# Patient Record
Sex: Female | Born: 1965 | Race: Black or African American | Hispanic: No | Marital: Married | Smoking: Current every day smoker
Health system: Southern US, Community
[De-identification: ages and names within clinical notes are randomized; demographics above are authoritative.]

## PROBLEM LIST (undated history)

## (undated) DIAGNOSIS — I499 Cardiac arrhythmia, unspecified: Secondary | ICD-10-CM

## (undated) DIAGNOSIS — K279 Peptic ulcer, site unspecified, unspecified as acute or chronic, without hemorrhage or perforation: Secondary | ICD-10-CM

## (undated) DIAGNOSIS — I099 Rheumatic heart disease, unspecified: Secondary | ICD-10-CM

## (undated) DIAGNOSIS — G4730 Sleep apnea, unspecified: Secondary | ICD-10-CM

## (undated) DIAGNOSIS — T7840XA Allergy, unspecified, initial encounter: Secondary | ICD-10-CM

## (undated) DIAGNOSIS — M10272 Drug-induced gout, left ankle and foot: Secondary | ICD-10-CM

## (undated) DIAGNOSIS — J189 Pneumonia, unspecified organism: Secondary | ICD-10-CM

## (undated) DIAGNOSIS — E119 Type 2 diabetes mellitus without complications: Secondary | ICD-10-CM

## (undated) DIAGNOSIS — M1990 Unspecified osteoarthritis, unspecified site: Secondary | ICD-10-CM

## (undated) DIAGNOSIS — I272 Pulmonary hypertension, unspecified: Secondary | ICD-10-CM

## (undated) DIAGNOSIS — J449 Chronic obstructive pulmonary disease, unspecified: Secondary | ICD-10-CM

## (undated) DIAGNOSIS — I10 Essential (primary) hypertension: Secondary | ICD-10-CM

## (undated) DIAGNOSIS — I2510 Atherosclerotic heart disease of native coronary artery without angina pectoris: Secondary | ICD-10-CM

## (undated) DIAGNOSIS — J45909 Unspecified asthma, uncomplicated: Secondary | ICD-10-CM

## (undated) DIAGNOSIS — I509 Heart failure, unspecified: Secondary | ICD-10-CM

## (undated) DIAGNOSIS — M797 Fibromyalgia: Secondary | ICD-10-CM

## (undated) DIAGNOSIS — F419 Anxiety disorder, unspecified: Secondary | ICD-10-CM

## (undated) DIAGNOSIS — K219 Gastro-esophageal reflux disease without esophagitis: Secondary | ICD-10-CM

## (undated) HISTORY — DX: Drug-induced gout, left ankle and foot: M10.272

## (undated) HISTORY — PX: CORONARY ARTERY BYPASS GRAFT: SHX141

## (undated) HISTORY — PX: MITRAL VALVE REPLACEMENT: SHX147

## (undated) HISTORY — DX: Chronic obstructive pulmonary disease, unspecified: J44.9

## (undated) HISTORY — DX: Essential (primary) hypertension: I10

## (undated) HISTORY — PX: TONSILLECTOMY: SUR1361

## (undated) HISTORY — DX: Allergy, unspecified, initial encounter: T78.40XA

## (undated) HISTORY — DX: Gastro-esophageal reflux disease without esophagitis: K21.9

## (undated) HISTORY — DX: Unspecified osteoarthritis, unspecified site: M19.90

## (undated) HISTORY — PX: ADENOIDECTOMY: SUR15

## (undated) HISTORY — PX: MULTIPLE TOOTH EXTRACTIONS: SHX2053

## (undated) HISTORY — DX: Peptic ulcer, site unspecified, unspecified as acute or chronic, without hemorrhage or perforation: K27.9

## (undated) NOTE — *Deleted (*Deleted)
The patient presents today via audio telemedicine to review results of Holter monitor. At the last office visit, the patient reported a recent history of intermittent palpitations described as a fluttering sensation, which can last off and on for 45 minutes to 2 days at a time. She takes metoprolol tartrate 100 mg BID. 72-hour Holter monitor starting on 08/08/2020 revealed predominant sinus rhythm with a mean heart rate of 85 bpm with heart rate ranging from 66-144 bpm. There were occasional premature atrial contractions and infrequent premature ventricular contractions present. There were rare atrial runs, the longest lasting 9 minutes. There was no correlation with diary entries. The patient reports that palpitations are about the same, no worse. She denies chest pain. She has chronic exertional shortness of breath, and shortness of breath at rest with underlying severe asthma, chronic oxygen supplementation requirement at 4 L, and anemia. The patient reported a recent history of episodes of melena, for which she is now followed by GI, and will be undergoing an EGD and colonoscopy. Xarelto was discontinued by her primary care provider. The patient has chronic lower extremity edema, which has worsened recently. She reports that her weight fluctuates, but is currently up 3 pounds overnight. She takes Lasix 80 mg in the morning, 40 mg at night, and metolazone 2.5 mg every other day. Labs on 08/08/2020 show sodium 140, potassium 3.5, BUN 14, creatinine 0.6. She drinks about 64 ounces of fluids per day. She sleeps at an incline and uses a CPAP. She continues to smoke about 3 cigarettes per day. She was scheduled to be seen in [facility], but canceled due to concerns of Covid.  2D echocardiogram 01/29/2020 revealed LV ejection fraction greater than 55%, stable appearing mitral valve bioprosthesis, mild mitral regurgitation, moderate tricuspid regurgitation consistent with moderate pulmonary hypertension.    The patient has paroxysmal atrial fibrillation, previously on Xarelto, which was reportedly discontinued by her PCP about 3 months ago when the patient reported tarry stools. Recent CBC is not available to review. She did have a GI bleed in 04/2018 due to small gastric ulcer.  The patient has essential hypertension currently on metoprolol tartrate, furosemide 80 mg in the morning and 40 mg in the evening, and metolazone 2.5 mg every other day, which are well tolerated without apparent side effects.The patienttries tofollow a low-sodium, no added salt diet.   Patient states she is having dizziness and vertigo daily. States symptoms improve when she takes Meclizine. States she had a bad episode two days ago. States she took two Valium during severe episode with relief. States she almost went to the hospital for the severe episode. Can have nausea with dizziness. States she is tired after episodes of dizziness.    DATA SUMMARY: 07/23/2019 MRI brain IMPRESSION: 1. No acute intracranial abnormality. Stable noncontrast MRI appearance of the brain since 2015, negative for age aside from a small chronic left cerebellar PICA infarct. 2. 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 Note that occasionally such an injury can be associated with dysfunction of the Oculocardiac Reflex.  01/04/2019 EMG IMPRESSION: Normal study. There is no electrodiagnostic evidence of a large fiber neuropathy in the lower extremities.   01/05/2018 MRI L spine IMPRESSION: 1. Mild disc degeneration without progression since July 2018 comparison. No degenerative impingement. 2. Epidural fat expansion that has progressed from 2018, with moderate thecal sac effacement at L4-5.  Patient states headaches are better. States she has a headache twice weekly. States headaches are located  at the top of her head and the right side of her head. States pain can radiate to the back of her neck.  States she is no longer sleeping since stopping Ambien. States Valium 5 mg twice daily has helped with headaches. No side effects. Taking nortriptyline 50 mg nightly. No side effects.

## (undated) NOTE — *Deleted (*Deleted)
HEADACHE  - Stable.  - Patient with headaches. States headaches are less frequent and less severe. Reports two headaches a week. Reports difficulty sleeping since stopping Ambien.  - Start amitriptyline 50 mg nightly.  - Discontinue nortriptyline.  - Continue gabapentin 600 mg three times daily, refilled.  - Continue Valium 5 mg twice daily, refilled. - Continue Fioricet as needed at headache onset, refilled.   DIPLOPIA - Ongoing.  - Patient with diplopia and blurry vision. States this occurs every day.  - Will continue to monitor.   DIZZINESS - Ongoing.  - Patient with spells of dizziness and vertigo. States she had a severe spell of dizziness two days ago. States she has nausea and numbness in her face and hands when she has a spell.  - Continue Meclizine 25 mg three times daily as needed, refilled.  - Continue phenergan 12.5 mg as needed for nausea, refilled.  - Continue Valium as above.  - Send referral to physical therapy for vestibular rehab.   NUMBNESS - Ongoing.  - Patient with numbness in her head, face, and hands. States numbness occurs when she has spells of dizziness.  - Will continue to monitor.

---

## 1998-05-22 ENCOUNTER — Emergency Department (HOSPITAL_COMMUNITY): Admission: EM | Admit: 1998-05-22 | Discharge: 1998-05-22 | Payer: Self-pay | Admitting: Emergency Medicine

## 1998-06-14 ENCOUNTER — Emergency Department (HOSPITAL_COMMUNITY): Admission: EM | Admit: 1998-06-14 | Discharge: 1998-06-14 | Payer: Self-pay | Admitting: Emergency Medicine

## 1999-01-15 ENCOUNTER — Encounter: Payer: Self-pay | Admitting: Emergency Medicine

## 1999-01-15 ENCOUNTER — Emergency Department (HOSPITAL_COMMUNITY): Admission: EM | Admit: 1999-01-15 | Discharge: 1999-01-15 | Payer: Self-pay | Admitting: Emergency Medicine

## 1999-02-13 ENCOUNTER — Emergency Department (HOSPITAL_COMMUNITY): Admission: EM | Admit: 1999-02-13 | Discharge: 1999-02-13 | Payer: Self-pay | Admitting: Emergency Medicine

## 1999-04-23 ENCOUNTER — Encounter: Admission: RE | Admit: 1999-04-23 | Discharge: 1999-04-23 | Payer: Self-pay | Admitting: Family Medicine

## 1999-05-26 ENCOUNTER — Encounter: Admission: RE | Admit: 1999-05-26 | Discharge: 1999-05-26 | Payer: Self-pay | Admitting: Family Medicine

## 1999-07-06 ENCOUNTER — Emergency Department (HOSPITAL_COMMUNITY): Admission: EM | Admit: 1999-07-06 | Discharge: 1999-07-06 | Payer: Self-pay | Admitting: Emergency Medicine

## 1999-10-26 ENCOUNTER — Encounter: Payer: Self-pay | Admitting: Emergency Medicine

## 1999-10-26 ENCOUNTER — Emergency Department (HOSPITAL_COMMUNITY): Admission: EM | Admit: 1999-10-26 | Discharge: 1999-10-26 | Payer: Self-pay | Admitting: Emergency Medicine

## 1999-12-03 ENCOUNTER — Emergency Department (HOSPITAL_COMMUNITY): Admission: EM | Admit: 1999-12-03 | Discharge: 1999-12-03 | Payer: Self-pay | Admitting: Emergency Medicine

## 2000-03-20 ENCOUNTER — Emergency Department (HOSPITAL_COMMUNITY): Admission: EM | Admit: 2000-03-20 | Discharge: 2000-03-20 | Payer: Self-pay | Admitting: Emergency Medicine

## 2000-04-26 ENCOUNTER — Emergency Department (HOSPITAL_COMMUNITY): Admission: EM | Admit: 2000-04-26 | Discharge: 2000-04-26 | Payer: Self-pay | Admitting: *Deleted

## 2000-06-08 ENCOUNTER — Emergency Department (HOSPITAL_COMMUNITY): Admission: EM | Admit: 2000-06-08 | Discharge: 2000-06-08 | Payer: Self-pay | Admitting: *Deleted

## 2007-02-09 ENCOUNTER — Ambulatory Visit: Payer: Self-pay | Admitting: Obstetrics & Gynecology

## 2007-02-09 ENCOUNTER — Inpatient Hospital Stay (HOSPITAL_COMMUNITY): Admission: AD | Admit: 2007-02-09 | Discharge: 2007-02-11 | Payer: Self-pay | Admitting: Obstetrics & Gynecology

## 2012-06-01 ENCOUNTER — Inpatient Hospital Stay: Payer: Self-pay | Admitting: Internal Medicine

## 2012-06-01 LAB — URINALYSIS, COMPLETE
Bacteria: NONE SEEN
Bilirubin,UR: NEGATIVE
Blood: NEGATIVE
Glucose,UR: NEGATIVE mg/dL (ref 0–75)
Ketone: NEGATIVE
Leukocyte Esterase: NEGATIVE
Nitrite: NEGATIVE
Ph: 5 (ref 4.5–8.0)
Protein: NEGATIVE
RBC,UR: <1 /HPF (ref 0–5)
Specific Gravity: 1.008 (ref 1.003–1.030)
Squamous Epithelial: 1
WBC UR: 1 /HPF (ref 0–5)

## 2012-06-01 LAB — COMPREHENSIVE METABOLIC PANEL
Albumin: 3.5 g/dL (ref 3.4–5.0)
Alkaline Phosphatase: 100 U/L (ref 50–136)
Anion Gap: 8 (ref 7–16)
BUN: 9 mg/dL (ref 7–18)
Bilirubin,Total: 0.4 mg/dL (ref 0.2–1.0)
Calcium, Total: 8.6 mg/dL (ref 8.5–10.1)
Chloride: 108 mmol/L — ABNORMAL HIGH (ref 98–107)
Co2: 24 mmol/L (ref 21–32)
Creatinine: 0.78 mg/dL (ref 0.60–1.30)
EGFR (African American): >60
EGFR (Non-African Amer.): >60
Glucose: 89 mg/dL (ref 65–99)
Osmolality: 278 (ref 275–301)
Potassium: 3.7 mmol/L (ref 3.5–5.1)
SGOT(AST): 26 U/L (ref 15–37)
SGPT (ALT): 22 U/L
Sodium: 140 mmol/L (ref 136–145)
Total Protein: 7.6 g/dL (ref 6.4–8.2)

## 2012-06-01 LAB — DRUG SCREEN, URINE
Amphetamines, Ur Screen: NEGATIVE (ref ?–1000)
Barbiturates, Ur Screen: NEGATIVE (ref ?–200)
Benzodiazepine, Ur Scrn: NEGATIVE (ref ?–200)
Cannabinoid 50 Ng, Ur ~~LOC~~: POSITIVE (ref ?–50)
MDMA (Ecstasy)Ur Screen: NEGATIVE (ref ?–500)
Methadone, Ur Screen: NEGATIVE (ref ?–300)
Phencyclidine (PCP) Ur S: NEGATIVE (ref ?–25)

## 2012-06-01 LAB — CBC
HCT: 35.2 % (ref 35.0–47.0)
HGB: 11.5 g/dL — ABNORMAL LOW (ref 12.0–16.0)
MCH: 29.7 pg (ref 26.0–34.0)
MCHC: 32.6 g/dL (ref 32.0–36.0)
MCV: 91 fL (ref 80–100)
Platelet: 199 10*3/uL (ref 150–440)
RBC: 3.88 10*6/uL (ref 3.80–5.20)
RDW: 13.6 % (ref 11.5–14.5)
WBC: 8.3 10*3/uL (ref 3.6–11.0)

## 2012-06-01 LAB — CK TOTAL AND CKMB (NOT AT ARMC)
CK, Total: 124 U/L (ref 21–215)
CK-MB: 1.6 ng/mL (ref 0.5–3.6)

## 2012-06-01 LAB — PREGNANCY, URINE: Pregnancy Test, Urine: NEGATIVE m[IU]/mL

## 2012-06-01 LAB — PRO B NATRIURETIC PEPTIDE: B-Type Natriuretic Peptide: 1437 pg/mL — ABNORMAL HIGH (ref 0–125)

## 2012-06-01 LAB — TROPONIN I
Troponin-I: <0.02 ng/mL
Troponin-I: <0.02 ng/mL

## 2012-06-02 LAB — BASIC METABOLIC PANEL
Anion Gap: 12 (ref 7–16)
BUN: 12 mg/dL (ref 7–18)
Creatinine: 1.09 mg/dL (ref 0.60–1.30)
EGFR (African American): >60
EGFR (Non-African Amer.): >60
Glucose: 145 mg/dL — ABNORMAL HIGH (ref 65–99)
Osmolality: 280 (ref 275–301)
Potassium: 3.6 mmol/L (ref 3.5–5.1)

## 2012-06-02 LAB — CBC WITH DIFFERENTIAL/PLATELET
Basophil %: 0.1 %
Eosinophil #: 0 10*3/uL (ref 0.0–0.7)
Eosinophil %: 0 %
Lymphocyte %: 7.4 %
MCH: 29 pg (ref 26.0–34.0)
Monocyte %: 3.2 %
Neutrophil %: 89.3 %
Platelet: 226 10*3/uL (ref 150–440)
RBC: 4.06 10*6/uL (ref 3.80–5.20)
WBC: 9 10*3/uL (ref 3.6–11.0)

## 2012-06-02 LAB — LIPID PANEL
Cholesterol: 145 mg/dL (ref 0–200)
VLDL Cholesterol, Calc: 21 mg/dL (ref 5–40)

## 2012-06-02 LAB — PRO B NATRIURETIC PEPTIDE: B-Type Natriuretic Peptide: 2126 pg/mL — ABNORMAL HIGH (ref 0–125)

## 2012-06-02 LAB — CK TOTAL AND CKMB (NOT AT ARMC): CK, Total: 93 U/L (ref 21–215)

## 2012-06-03 LAB — BASIC METABOLIC PANEL
Anion Gap: 9 (ref 7–16)
Calcium, Total: 8.4 mg/dL — ABNORMAL LOW (ref 8.5–10.1)
Chloride: 104 mmol/L (ref 98–107)
Co2: 25 mmol/L (ref 21–32)
Creatinine: 0.65 mg/dL (ref 0.60–1.30)
EGFR (African American): >60
Osmolality: 277 (ref 275–301)

## 2012-06-07 ENCOUNTER — Emergency Department: Payer: Self-pay | Admitting: Emergency Medicine

## 2012-06-12 ENCOUNTER — Inpatient Hospital Stay: Payer: Self-pay | Admitting: Internal Medicine

## 2012-06-12 LAB — TROPONIN I
Troponin-I: <0.02 ng/mL
Troponin-I: <0.02 ng/mL
Troponin-I: <0.02 ng/mL

## 2012-06-12 LAB — COMPREHENSIVE METABOLIC PANEL WITH GFR
Albumin: 3.2 g/dL — ABNORMAL LOW
Alkaline Phosphatase: 87 U/L
Anion Gap: 13
BUN: 10 mg/dL
Bilirubin,Total: 0.3 mg/dL
Calcium, Total: 8.2 mg/dL — ABNORMAL LOW
Chloride: 107 mmol/L
Co2: 23 mmol/L
Creatinine: 0.67 mg/dL
EGFR (African American): >60
EGFR (Non-African Amer.): >60
Glucose: 113 mg/dL — ABNORMAL HIGH
Osmolality: 285
Potassium: 3.8 mmol/L
SGOT(AST): 25 U/L
SGPT (ALT): 23 U/L
Sodium: 143 mmol/L
Total Protein: 7.3 g/dL

## 2012-06-12 LAB — CBC
HCT: 33.9 % — ABNORMAL LOW
HGB: 11 g/dL — ABNORMAL LOW
MCH: 29.2 pg
MCHC: 32.6 g/dL
MCV: 90 fL
Platelet: 188 x10 3/mm 3
RBC: 3.78 X10 6/mm 3 — ABNORMAL LOW
RDW: 13.5 %
WBC: 8.8 x10 3/mm 3

## 2012-06-12 LAB — PRO B NATRIURETIC PEPTIDE: B-Type Natriuretic Peptide: 2102 pg/mL — ABNORMAL HIGH

## 2012-06-13 LAB — COMPREHENSIVE METABOLIC PANEL
Alkaline Phosphatase: 81 U/L (ref 50–136)
Bilirubin,Total: 0.5 mg/dL (ref 0.2–1.0)
Chloride: 105 mmol/L (ref 98–107)
Co2: 29 mmol/L (ref 21–32)
Creatinine: 0.71 mg/dL (ref 0.60–1.30)
EGFR (African American): >60
EGFR (Non-African Amer.): >60
Potassium: 3.4 mmol/L — ABNORMAL LOW (ref 3.5–5.1)
SGOT(AST): 19 U/L (ref 15–37)
SGPT (ALT): 22 U/L

## 2012-06-13 LAB — CBC WITH DIFFERENTIAL/PLATELET
Eosinophil %: 2.5 %
HCT: 35.3 % (ref 35.0–47.0)
Lymphocyte #: 1.7 10*3/uL (ref 1.0–3.6)
Lymphocyte %: 17.1 %
MCV: 90 fL (ref 80–100)
Monocyte %: 6.2 %
Neutrophil #: 7.1 10*3/uL — ABNORMAL HIGH (ref 1.4–6.5)
Platelet: 192 10*3/uL (ref 150–440)
RBC: 3.91 10*6/uL (ref 3.80–5.20)

## 2012-06-14 LAB — PROTIME-INR: INR: 0.9

## 2012-06-14 LAB — BASIC METABOLIC PANEL
Anion Gap: 11 (ref 7–16)
Chloride: 98 mmol/L (ref 98–107)
Co2: 28 mmol/L (ref 21–32)
Creatinine: 0.62 mg/dL (ref 0.60–1.30)
EGFR (African American): >60
Osmolality: 273 (ref 275–301)
Potassium: 3.5 mmol/L (ref 3.5–5.1)

## 2012-06-14 LAB — CBC
HGB: 12.5 g/dL (ref 12.0–16.0)
MCH: 28.4 pg (ref 26.0–34.0)
MCHC: 31.7 g/dL — ABNORMAL LOW (ref 32.0–36.0)
MCV: 90 fL (ref 80–100)
Platelet: 243 10*3/uL (ref 150–440)

## 2012-06-14 LAB — DRUG SCREEN, URINE
Amphetamines, Ur Screen: NEGATIVE (ref ?–1000)
Barbiturates, Ur Screen: NEGATIVE (ref ?–200)
Cocaine Metabolite,Ur ~~LOC~~: NEGATIVE (ref ?–300)
Methadone, Ur Screen: NEGATIVE (ref ?–300)
Opiate, Ur Screen: POSITIVE (ref ?–300)
Phencyclidine (PCP) Ur S: NEGATIVE (ref ?–25)

## 2012-08-21 ENCOUNTER — Emergency Department: Payer: Self-pay | Admitting: Emergency Medicine

## 2012-08-21 LAB — CBC WITH DIFFERENTIAL/PLATELET
Basophil #: 0.1 10*3/uL (ref 0.0–0.1)
Eosinophil %: 0.6 %
HCT: 37 % (ref 35.0–47.0)
HGB: 11.9 g/dL — ABNORMAL LOW (ref 12.0–16.0)
Lymphocyte #: 2.1 10*3/uL (ref 1.0–3.6)
MCH: 27.4 pg (ref 26.0–34.0)
MCHC: 32.2 g/dL (ref 32.0–36.0)
MCV: 85 fL (ref 80–100)
Monocyte #: 0.7 x10 3/mm (ref 0.2–0.9)
Monocyte %: 7.9 %
Neutrophil %: 67.8 %
RBC: 4.36 10*6/uL (ref 3.80–5.20)
RDW: 15.4 % — ABNORMAL HIGH (ref 11.5–14.5)

## 2012-08-21 LAB — COMPREHENSIVE METABOLIC PANEL
Albumin: 3.1 g/dL — ABNORMAL LOW (ref 3.4–5.0)
Alkaline Phosphatase: 101 U/L (ref 50–136)
Anion Gap: 7 (ref 7–16)
BUN: 8 mg/dL (ref 7–18)
Calcium, Total: 8.3 mg/dL — ABNORMAL LOW (ref 8.5–10.1)
Chloride: 108 mmol/L — ABNORMAL HIGH (ref 98–107)
Co2: 25 mmol/L (ref 21–32)
Creatinine: 0.85 mg/dL (ref 0.60–1.30)
EGFR (Non-African Amer.): >60
Osmolality: 278 (ref 275–301)
Potassium: 4.3 mmol/L (ref 3.5–5.1)
SGOT(AST): 20 U/L (ref 15–37)
SGPT (ALT): 18 U/L (ref 12–78)
Sodium: 140 mmol/L (ref 136–145)

## 2012-08-21 LAB — URINALYSIS, COMPLETE
Bacteria: NONE SEEN
Bilirubin,UR: NEGATIVE
Glucose,UR: NEGATIVE mg/dL (ref 0–75)
Hyaline Cast: 1
Ketone: NEGATIVE
Nitrite: NEGATIVE
Protein: NEGATIVE
RBC,UR: 1 /HPF (ref 0–5)
Specific Gravity: 1.016 (ref 1.003–1.030)
Squamous Epithelial: 1
WBC UR: 1 /HPF (ref 0–5)

## 2012-08-21 LAB — PRO B NATRIURETIC PEPTIDE: B-Type Natriuretic Peptide: 907 pg/mL — ABNORMAL HIGH (ref 0–125)

## 2012-08-21 LAB — CK TOTAL AND CKMB (NOT AT ARMC)
CK, Total: 54 U/L (ref 21–215)
CK-MB: <0.5 ng/mL — ABNORMAL LOW (ref 0.5–3.6)

## 2012-08-21 LAB — TROPONIN I: Troponin-I: <0.02 ng/mL

## 2012-09-12 ENCOUNTER — Observation Stay: Payer: Self-pay | Admitting: Specialist

## 2012-09-12 LAB — BASIC METABOLIC PANEL
Anion Gap: 8 (ref 7–16)
BUN: 8 mg/dL (ref 7–18)
Chloride: 106 mmol/L (ref 98–107)
Creatinine: 0.62 mg/dL (ref 0.60–1.30)
EGFR (African American): >60
Potassium: 4 mmol/L (ref 3.5–5.1)

## 2012-09-12 LAB — CK TOTAL AND CKMB (NOT AT ARMC): CK, Total: 68 U/L (ref 21–215)

## 2012-09-12 LAB — TROPONIN I: Troponin-I: <0.02 ng/mL

## 2012-09-12 LAB — CBC
HCT: 40 % (ref 35.0–47.0)
MCH: 26.5 pg (ref 26.0–34.0)
MCHC: 32.1 g/dL (ref 32.0–36.0)
Platelet: 210 10*3/uL (ref 150–440)
RBC: 4.86 10*6/uL (ref 3.80–5.20)
WBC: 7.3 10*3/uL (ref 3.6–11.0)

## 2012-09-12 LAB — MAGNESIUM: Magnesium: 1.8 mg/dL

## 2012-09-13 LAB — BASIC METABOLIC PANEL
Calcium, Total: 8.4 mg/dL — ABNORMAL LOW (ref 8.5–10.1)
Chloride: 102 mmol/L (ref 98–107)
Co2: 31 mmol/L (ref 21–32)
Creatinine: 0.8 mg/dL (ref 0.60–1.30)
EGFR (African American): >60
EGFR (Non-African Amer.): >60
Osmolality: 277 (ref 275–301)

## 2012-09-13 LAB — CBC WITH DIFFERENTIAL/PLATELET
Basophil #: 0.1 10*3/uL (ref 0.0–0.1)
Eosinophil #: 0.1 10*3/uL (ref 0.0–0.7)
Eosinophil %: 0.7 %
HGB: 12.8 g/dL (ref 12.0–16.0)
Lymphocyte #: 1.5 10*3/uL (ref 1.0–3.6)
MCH: 26.8 pg (ref 26.0–34.0)
MCV: 83 fL (ref 80–100)
Monocyte #: 0.6 x10 3/mm (ref 0.2–0.9)
Neutrophil %: 72.5 %
Platelet: 192 10*3/uL (ref 150–440)
RBC: 4.76 10*6/uL (ref 3.80–5.20)
RDW: 15.9 % — ABNORMAL HIGH (ref 11.5–14.5)
WBC: 7.9 10*3/uL (ref 3.6–11.0)

## 2012-09-13 LAB — MAGNESIUM: Magnesium: 1.5 mg/dL — ABNORMAL LOW

## 2013-03-28 ENCOUNTER — Emergency Department: Payer: Self-pay | Admitting: Emergency Medicine

## 2013-03-28 LAB — COMPREHENSIVE METABOLIC PANEL
Alkaline Phosphatase: 248 U/L — ABNORMAL HIGH (ref 50–136)
Anion Gap: 9 (ref 7–16)
BUN: 6 mg/dL — ABNORMAL LOW (ref 7–18)
Bilirubin,Total: 2.2 mg/dL — ABNORMAL HIGH (ref 0.2–1.0)
Calcium, Total: 8.6 mg/dL (ref 8.5–10.1)
Chloride: 103 mmol/L (ref 98–107)
EGFR (African American): >60
Osmolality: 267 (ref 275–301)
Potassium: 5.4 mmol/L — ABNORMAL HIGH (ref 3.5–5.1)
SGOT(AST): 77 U/L — ABNORMAL HIGH (ref 15–37)
Sodium: 135 mmol/L — ABNORMAL LOW (ref 136–145)
Total Protein: 8.2 g/dL (ref 6.4–8.2)

## 2013-03-28 LAB — CK TOTAL AND CKMB (NOT AT ARMC): CK, Total: 189 U/L (ref 21–215)

## 2013-03-28 LAB — DRUG SCREEN, URINE
Barbiturates, Ur Screen: NEGATIVE (ref ?–200)
Benzodiazepine, Ur Scrn: POSITIVE (ref ?–200)
Cannabinoid 50 Ng, Ur ~~LOC~~: POSITIVE (ref ?–50)
Cocaine Metabolite,Ur ~~LOC~~: NEGATIVE (ref ?–300)
Opiate, Ur Screen: NEGATIVE (ref ?–300)

## 2013-03-28 LAB — CBC
HCT: 41.8 % (ref 35.0–47.0)
HGB: 13.8 g/dL (ref 12.0–16.0)
MCHC: 32.9 g/dL (ref 32.0–36.0)
MCV: 85 fL (ref 80–100)
Platelet: 240 10*3/uL (ref 150–440)

## 2013-04-25 ENCOUNTER — Emergency Department: Payer: Self-pay | Admitting: Emergency Medicine

## 2013-04-25 LAB — BASIC METABOLIC PANEL
Anion Gap: 6 — ABNORMAL LOW (ref 7–16)
BUN: 9 mg/dL (ref 7–18)
Calcium, Total: 8.9 mg/dL (ref 8.5–10.1)
Chloride: 106 mmol/L (ref 98–107)
Co2: 27 mmol/L (ref 21–32)
Creatinine: 0.7 mg/dL (ref 0.60–1.30)
EGFR (African American): >60
EGFR (Non-African Amer.): >60
Glucose: 83 mg/dL (ref 65–99)
Osmolality: 275 (ref 275–301)
Potassium: 3.9 mmol/L (ref 3.5–5.1)
Sodium: 139 mmol/L (ref 136–145)

## 2013-04-25 LAB — CBC
HCT: 41.3 % (ref 35.0–47.0)
HGB: 13.4 g/dL (ref 12.0–16.0)
MCH: 27.5 pg (ref 26.0–34.0)
MCHC: 32.4 g/dL (ref 32.0–36.0)
MCV: 85 fL (ref 80–100)
Platelet: 188 10*3/uL (ref 150–440)
RBC: 4.86 10*6/uL (ref 3.80–5.20)
RDW: 16.7 % — ABNORMAL HIGH (ref 11.5–14.5)
WBC: 8.1 10*3/uL (ref 3.6–11.0)

## 2013-04-25 LAB — TROPONIN I: Troponin-I: <0.02 ng/mL

## 2013-04-25 LAB — PRO B NATRIURETIC PEPTIDE: B-Type Natriuretic Peptide: 731 pg/mL — ABNORMAL HIGH (ref 0–125)

## 2013-12-08 ENCOUNTER — Inpatient Hospital Stay: Payer: Self-pay | Admitting: Internal Medicine

## 2013-12-08 LAB — BASIC METABOLIC PANEL
ANION GAP: 9 (ref 7–16)
BUN: 7 mg/dL (ref 7–18)
CALCIUM: 8.4 mg/dL — AB (ref 8.5–10.1)
Chloride: 104 mmol/L (ref 98–107)
Co2: 23 mmol/L (ref 21–32)
Creatinine: 0.55 mg/dL — ABNORMAL LOW (ref 0.60–1.30)
EGFR (African American): >60
Glucose: 75 mg/dL (ref 65–99)
Osmolality: 269 (ref 275–301)
POTASSIUM: 3.8 mmol/L (ref 3.5–5.1)
Sodium: 136 mmol/L (ref 136–145)

## 2013-12-08 LAB — HEPATIC FUNCTION PANEL A (ARMC)
Albumin: 3.3 g/dL — ABNORMAL LOW (ref 3.4–5.0)
Alkaline Phosphatase: 275 U/L — ABNORMAL HIGH
Bilirubin, Direct: 1.2 mg/dL — ABNORMAL HIGH (ref 0.00–0.20)
Bilirubin,Total: 2.3 mg/dL — ABNORMAL HIGH (ref 0.2–1.0)
SGOT(AST): 50 U/L — ABNORMAL HIGH (ref 15–37)
SGPT (ALT): 24 U/L (ref 12–78)
Total Protein: 7.9 g/dL (ref 6.4–8.2)

## 2013-12-08 LAB — CBC
HCT: 41.5 % (ref 35.0–47.0)
HGB: 13.6 g/dL (ref 12.0–16.0)
MCH: 28.4 pg (ref 26.0–34.0)
MCHC: 32.7 g/dL (ref 32.0–36.0)
MCV: 87 fL (ref 80–100)
PLATELETS: 165 10*3/uL (ref 150–440)
RBC: 4.78 10*6/uL (ref 3.80–5.20)
RDW: 17.3 % — AB (ref 11.5–14.5)
WBC: 7.1 10*3/uL (ref 3.6–11.0)

## 2013-12-08 LAB — HCG, QUANTITATIVE, PREGNANCY: Beta Hcg, Quant.: 1 m[IU]/mL

## 2013-12-08 LAB — TROPONIN I: Troponin-I: <0.02 ng/mL

## 2013-12-08 LAB — PRO B NATRIURETIC PEPTIDE: B-Type Natriuretic Peptide: 432 pg/mL — ABNORMAL HIGH (ref 0–125)

## 2013-12-09 LAB — BASIC METABOLIC PANEL
ANION GAP: 6 — AB (ref 7–16)
BUN: 10 mg/dL (ref 7–18)
CALCIUM: 8.7 mg/dL (ref 8.5–10.1)
CHLORIDE: 104 mmol/L (ref 98–107)
CO2: 26 mmol/L (ref 21–32)
CREATININE: 0.81 mg/dL (ref 0.60–1.30)
EGFR (African American): >60
Glucose: 134 mg/dL — ABNORMAL HIGH (ref 65–99)
Osmolality: 273 (ref 275–301)
Potassium: 4.5 mmol/L (ref 3.5–5.1)
SODIUM: 136 mmol/L (ref 136–145)

## 2013-12-09 LAB — TSH: Thyroid Stimulating Horm: 0.561 u[IU]/mL

## 2013-12-09 LAB — CBC WITH DIFFERENTIAL/PLATELET
BASOS PCT: 0.2 %
Basophil #: 0 10*3/uL (ref 0.0–0.1)
EOS ABS: 0 10*3/uL (ref 0.0–0.7)
EOS PCT: 0 %
HCT: 40.7 % (ref 35.0–47.0)
HGB: 13.5 g/dL (ref 12.0–16.0)
Lymphocyte #: 0.6 10*3/uL — ABNORMAL LOW (ref 1.0–3.6)
Lymphocyte %: 14.7 %
MCH: 29.3 pg (ref 26.0–34.0)
MCHC: 33.2 g/dL (ref 32.0–36.0)
MCV: 88 fL (ref 80–100)
Monocyte #: 0.2 x10 3/mm (ref 0.2–0.9)
Monocyte %: 4.3 %
NEUTROS ABS: 3.6 10*3/uL (ref 1.4–6.5)
Neutrophil %: 80.8 %
Platelet: 150 10*3/uL (ref 150–440)
RBC: 4.6 10*6/uL (ref 3.80–5.20)
RDW: 16.9 % — ABNORMAL HIGH (ref 11.5–14.5)
WBC: 4.4 10*3/uL (ref 3.6–11.0)

## 2013-12-09 LAB — LIPID PANEL
CHOLESTEROL: 91 mg/dL (ref 0–200)
HDL: 31 mg/dL — AB (ref 40–60)
Ldl Cholesterol, Calc: 50 mg/dL (ref 0–100)
TRIGLYCERIDES: 50 mg/dL (ref 0–200)
VLDL CHOLESTEROL, CALC: 10 mg/dL (ref 5–40)

## 2013-12-10 LAB — BASIC METABOLIC PANEL
ANION GAP: 4 — AB (ref 7–16)
BUN: 19 mg/dL — ABNORMAL HIGH (ref 7–18)
CO2: 27 mmol/L (ref 21–32)
Calcium, Total: 8.7 mg/dL (ref 8.5–10.1)
Chloride: 102 mmol/L (ref 98–107)
Creatinine: 0.83 mg/dL (ref 0.60–1.30)
EGFR (African American): >60
GLUCOSE: 112 mg/dL — AB (ref 65–99)
Osmolality: 269 (ref 275–301)
Potassium: 4.2 mmol/L (ref 3.5–5.1)
Sodium: 133 mmol/L — ABNORMAL LOW (ref 136–145)

## 2013-12-11 LAB — BASIC METABOLIC PANEL
Anion Gap: 6 — ABNORMAL LOW (ref 7–16)
BUN: 22 mg/dL — ABNORMAL HIGH (ref 7–18)
CALCIUM: 8.9 mg/dL (ref 8.5–10.1)
CO2: 27 mmol/L (ref 21–32)
Chloride: 101 mmol/L (ref 98–107)
Creatinine: 0.79 mg/dL (ref 0.60–1.30)
GLUCOSE: 106 mg/dL — AB (ref 65–99)
Osmolality: 272 (ref 275–301)
Potassium: 4.6 mmol/L (ref 3.5–5.1)
SODIUM: 134 mmol/L — AB (ref 136–145)

## 2013-12-13 LAB — BASIC METABOLIC PANEL
Anion Gap: 3 — ABNORMAL LOW (ref 7–16)
BUN: 17 mg/dL (ref 7–18)
CALCIUM: 8.4 mg/dL — AB (ref 8.5–10.1)
CHLORIDE: 99 mmol/L (ref 98–107)
CO2: 30 mmol/L (ref 21–32)
Creatinine: 0.7 mg/dL (ref 0.60–1.30)
EGFR (African American): >60
Glucose: 105 mg/dL — ABNORMAL HIGH (ref 65–99)
Osmolality: 266 (ref 275–301)
Potassium: 4.3 mmol/L (ref 3.5–5.1)
Sodium: 132 mmol/L — ABNORMAL LOW (ref 136–145)

## 2013-12-13 LAB — PLATELET COUNT: Platelet: 213 10*3/uL (ref 150–440)

## 2013-12-14 LAB — BASIC METABOLIC PANEL
ANION GAP: 2 — AB (ref 7–16)
BUN: 18 mg/dL (ref 7–18)
CO2: 32 mmol/L (ref 21–32)
CREATININE: 0.77 mg/dL (ref 0.60–1.30)
Calcium, Total: 8.2 mg/dL — ABNORMAL LOW (ref 8.5–10.1)
Chloride: 100 mmol/L (ref 98–107)
EGFR (African American): >60
EGFR (Non-African Amer.): >60
Glucose: 99 mg/dL (ref 65–99)
Osmolality: 270 (ref 275–301)
Potassium: 4.1 mmol/L (ref 3.5–5.1)
SODIUM: 134 mmol/L — AB (ref 136–145)

## 2013-12-15 LAB — BASIC METABOLIC PANEL WITH GFR
Anion Gap: 3 — ABNORMAL LOW
BUN: 17 mg/dL
Calcium, Total: 8.3 mg/dL — ABNORMAL LOW
Chloride: 99 mmol/L
Co2: 37 mmol/L — ABNORMAL HIGH
Creatinine: 1.04 mg/dL
EGFR (African American): >60
EGFR (Non-African Amer.): >60
Glucose: 97 mg/dL
Osmolality: 279
Potassium: 4.3 mmol/L
Sodium: 139 mmol/L

## 2013-12-15 LAB — CK: CK, Total: 26 U/L

## 2013-12-16 LAB — BASIC METABOLIC PANEL
ANION GAP: 5 — AB (ref 7–16)
BUN: 18 mg/dL (ref 7–18)
CALCIUM: 8.1 mg/dL — AB (ref 8.5–10.1)
CREATININE: 0.87 mg/dL (ref 0.60–1.30)
Chloride: 98 mmol/L (ref 98–107)
Co2: 34 mmol/L — ABNORMAL HIGH (ref 21–32)
EGFR (African American): >60
EGFR (Non-African Amer.): >60
GLUCOSE: 114 mg/dL — AB (ref 65–99)
Osmolality: 277 (ref 275–301)
POTASSIUM: 4.4 mmol/L (ref 3.5–5.1)
SODIUM: 137 mmol/L (ref 136–145)

## 2013-12-16 LAB — SEDIMENTATION RATE: Erythrocyte Sed Rate: 1 mm/hr (ref 0–20)

## 2013-12-17 ENCOUNTER — Ambulatory Visit: Payer: Self-pay | Admitting: Neurology

## 2013-12-17 LAB — PLATELET COUNT: PLATELETS: 241 10*3/uL (ref 150–440)

## 2013-12-17 LAB — CK: CK, TOTAL: 39 U/L (ref 21–215)

## 2013-12-18 LAB — PROT IMMUNOELECTROPHORES(ARMC)

## 2013-12-20 LAB — PATHOLOGY REPORT

## 2014-01-15 ENCOUNTER — Emergency Department: Payer: Self-pay | Admitting: Emergency Medicine

## 2014-01-15 LAB — CBC
HCT: 42.5 % (ref 35.0–47.0)
HGB: 13.9 g/dL (ref 12.0–16.0)
MCH: 27.8 pg (ref 26.0–34.0)
MCHC: 32.6 g/dL (ref 32.0–36.0)
MCV: 85 fL (ref 80–100)
Platelet: 159 10*3/uL (ref 150–440)
RBC: 4.98 10*6/uL (ref 3.80–5.20)
RDW: 16.2 % — AB (ref 11.5–14.5)
WBC: 5.2 10*3/uL (ref 3.6–11.0)

## 2014-01-15 LAB — BASIC METABOLIC PANEL
Anion Gap: 6 — ABNORMAL LOW (ref 7–16)
BUN: 10 mg/dL (ref 7–18)
CHLORIDE: 103 mmol/L (ref 98–107)
Calcium, Total: 8.7 mg/dL (ref 8.5–10.1)
Co2: 29 mmol/L (ref 21–32)
Creatinine: 0.79 mg/dL (ref 0.60–1.30)
EGFR (African American): >60
EGFR (Non-African Amer.): >60
Glucose: 78 mg/dL (ref 65–99)
OSMOLALITY: 274 (ref 275–301)
Potassium: 3.6 mmol/L (ref 3.5–5.1)
Sodium: 138 mmol/L (ref 136–145)

## 2014-01-15 LAB — PRO B NATRIURETIC PEPTIDE: B-Type Natriuretic Peptide: 726 pg/mL — ABNORMAL HIGH (ref 0–125)

## 2014-01-15 LAB — TROPONIN I

## 2014-02-07 ENCOUNTER — Ambulatory Visit: Payer: Self-pay

## 2014-04-03 LAB — TSH: TSH: 2.54 u[IU]/mL (ref 0.41–5.90)

## 2014-04-03 LAB — LIPID PANEL
CHOLESTEROL: 96 mg/dL (ref 0–200)
HDL: 32 mg/dL — AB (ref 35–70)
LDL Cholesterol: 51 mg/dL
TRIGLYCERIDES: 63 mg/dL (ref 40–160)

## 2014-04-18 ENCOUNTER — Ambulatory Visit: Payer: Self-pay

## 2014-08-21 LAB — BASIC METABOLIC PANEL
BUN: 9 mg/dL (ref 4–21)
CREATININE: 0.6 mg/dL (ref 0.5–1.1)
Glucose: 79 mg/dL
Potassium: 4.2 mmol/L (ref 3.4–5.3)
SODIUM: 138 mmol/L (ref 137–147)

## 2014-08-21 LAB — HEMOGLOBIN A1C: HEMOGLOBIN A1C: 5.2

## 2014-08-21 LAB — CBC AND DIFFERENTIAL
HCT: 40 % (ref 36–46)
HEMOGLOBIN: 13 g/dL (ref 12.0–16.0)
Neutrophils Absolute: 4 /uL
PLATELETS: 155 10*3/uL (ref 150–399)
WBC: 6 10^3/mL

## 2014-08-21 LAB — HEPATIC FUNCTION PANEL
ALK PHOS: 352 U/L — AB (ref 25–125)
ALT: 18 U/L (ref 7–35)
AST: 39 U/L — AB (ref 13–35)
BILIRUBIN, TOTAL: 2.4 mg/dL
Bilirubin, Direct: 1.5 mg/dL — AB (ref 0.01–0.4)

## 2014-11-02 ENCOUNTER — Ambulatory Visit: Payer: Self-pay | Admitting: Specialist

## 2014-11-02 ENCOUNTER — Ambulatory Visit: Payer: Self-pay | Admitting: Nurse Practitioner

## 2014-11-05 ENCOUNTER — Emergency Department: Payer: Self-pay | Admitting: Emergency Medicine

## 2014-11-05 LAB — BASIC METABOLIC PANEL
Anion Gap: 10 (ref 7–16)
BUN: 15 mg/dL (ref 7–18)
Calcium, Total: 8.5 mg/dL (ref 8.5–10.1)
Chloride: 102 mmol/L (ref 98–107)
Co2: 27 mmol/L (ref 21–32)
Creatinine: 0.68 mg/dL (ref 0.60–1.30)
EGFR (African American): >60
EGFR (Non-African Amer.): >60
Glucose: 95 mg/dL (ref 65–99)
OSMOLALITY: 278 (ref 275–301)
Potassium: 3.7 mmol/L (ref 3.5–5.1)
SODIUM: 139 mmol/L (ref 136–145)

## 2014-11-05 LAB — CBC
HCT: 43.2 % (ref 35.0–47.0)
HGB: 13.7 g/dL (ref 12.0–16.0)
MCH: 30.2 pg (ref 26.0–34.0)
MCHC: 31.8 g/dL — AB (ref 32.0–36.0)
MCV: 95 fL (ref 80–100)
Platelet: 181 10*3/uL (ref 150–440)
RBC: 4.55 10*6/uL (ref 3.80–5.20)
RDW: 16.9 % — AB (ref 11.5–14.5)
WBC: 7.7 10*3/uL (ref 3.6–11.0)

## 2014-11-05 LAB — TROPONIN I: Troponin-I: <0.02 ng/mL

## 2014-11-14 ENCOUNTER — Emergency Department: Payer: Self-pay | Admitting: Emergency Medicine

## 2015-01-28 ENCOUNTER — Emergency Department: Payer: Self-pay | Admitting: Emergency Medicine

## 2015-02-20 ENCOUNTER — Emergency Department: Payer: Self-pay | Admitting: Emergency Medicine

## 2015-02-25 DIAGNOSIS — J209 Acute bronchitis, unspecified: Secondary | ICD-10-CM | POA: Diagnosis present

## 2015-02-25 DIAGNOSIS — F32A Depression, unspecified: Secondary | ICD-10-CM | POA: Insufficient documentation

## 2015-02-25 DIAGNOSIS — F1911 Other psychoactive substance abuse, in remission: Secondary | ICD-10-CM | POA: Insufficient documentation

## 2015-02-25 DIAGNOSIS — I5032 Chronic diastolic (congestive) heart failure: Secondary | ICD-10-CM | POA: Insufficient documentation

## 2015-02-25 DIAGNOSIS — F329 Major depressive disorder, single episode, unspecified: Secondary | ICD-10-CM | POA: Insufficient documentation

## 2015-02-25 DIAGNOSIS — J440 Chronic obstructive pulmonary disease with acute lower respiratory infection: Secondary | ICD-10-CM

## 2015-03-13 DIAGNOSIS — I099 Rheumatic heart disease, unspecified: Secondary | ICD-10-CM | POA: Insufficient documentation

## 2015-03-13 DIAGNOSIS — F411 Generalized anxiety disorder: Secondary | ICD-10-CM | POA: Insufficient documentation

## 2015-03-13 DIAGNOSIS — I272 Pulmonary hypertension, unspecified: Secondary | ICD-10-CM | POA: Insufficient documentation

## 2015-03-15 ENCOUNTER — Other Ambulatory Visit: Payer: Self-pay | Admitting: Oncology

## 2015-03-15 DIAGNOSIS — Z1231 Encounter for screening mammogram for malignant neoplasm of breast: Secondary | ICD-10-CM

## 2015-03-19 NOTE — Discharge Summary (Signed)
PATIENT NAME:  Chelsea Ramsey MR#:  [id number] DATE OF BIRTH:  Mar 27, 1966  DATE OF ADMISSION:  09/12/2012 DATE OF DISCHARGE:  09/14/2012  For a detailed note, please take a look at the history and physical done on admission with Dr. Bridgette Habermann.   DIAGNOSES AT DISCHARGE:  1. Acute bronchitis.  2. Shortness of breath due to acute bronchitis.  3. History of congestive heart failure secondary to diastolic dysfunction. 4. Pulmonary hypertension.  5. Constipation.  6. Tobacco abuse.   DIET: Patient is being discharged on a low sodium diet.   ACTIVITY: As tolerated.   FOLLOW UP: Follow up is with the [facility] in the next 1 to 2 weeks.   DISCHARGE MEDICATIONS:   1. Coreg 3.125 mg b.i.d.  2. Lisinopril 2.5 mg daily.  3. Benadryl 25 mg every six hours as needed.  4. Ibuprofen 800 mg q.8 hours as needed. 5. Sublingual nitroglycerin as needed.  6. Tylenol with hydrocodone 5/325, 1 tablet q.6 hours as needed.  7. Albuterol inhaler 2 puffs every four hours as needed. 8. Tessalon Perles 100 mg q.i.d.  9. Zithromax 500 mg daily x5 days.   LABORATORY, DIAGNOSTIC AND RADIOLOGICAL DATA: Pertinent studies done during the hospital course are as follows: Chest x-ray done on admission showing findings could represent bronchitis.   BRIEF HOSPITAL COURSE: This is a 49 year old female who presented to the hospital on 09/12/2012 secondary to wheezing, shortness of breath and a nonproductive cough.  1. Acute bronchitis. This was likely the cause of patient's wheezing, shortness of breath and nonproductive cough. She was started on some Zithromax. A rapid flu test was obtained which was negative. She was also started on p.r.n. nebulizer treatments. Her clinical symptoms since admission have improved. She still has a nonproductive cough. She was strongly advised to quit smoking as this could make her bronchitis worse. For now she is being discharged on Zithromax along with antitussives as mentioned.   2. History of congestive heart failure. Patient's shortness of breath was not related to congestive heart failure but likely due to acute bronchitis. She did not show any symptoms of congestive heart failure. She will continue her Coreg and lisinopril as mentioned.  3. Pulmonary hypertension. This was based on cardiac catheterization findings in July 2013. Unlikely this was contributing to her shortness of breath. Although patient is somewhat on the obese side and would benefit from outpatient sleep study which was recommended and can be done as an outpatient after referral from the [facility].  4. Constipation. This resolved with a Dulcolax suppository.   CODE STATUS: Patient is a FULL CODE.   TIME SPENT: 40 minutes.  ____________________________ Belia Heman. Verdell Carmine, MD vjs:cms D: 09/14/2012 13:47:19 ET T: 09/15/2012 11:09:05 ET JOB#: [id number]  cc: Belia Heman. Verdell Carmine, MD, <Dictator> [facility]  Henreitta Leber MD ELECTRONICALLY SIGNED 09/16/2012 12:22

## 2015-03-19 NOTE — Discharge Summary (Signed)
PATIENT NAME:  Chelsea Ramsey MR#:  [id number] DATE OF BIRTH:  09/08/1966  DATE OF ADMISSION:  06/12/2012 DATE OF DISCHARGE:  06/15/2012  DISCHARGE DIAGNOSES:  1. Acute on chronic diastolic heart failure with echo on recent admission in July 2013 showing normal ejection fraction and moderate-to-severe mitral regurgitation along with tricuspid regurgitation.  2. Chest pain, likely musculoskeletal.  3. Mild-to-moderate pulmonary hypertension that will require outpatient pulmonary follow-up.   SECONDARY DIAGNOSES:  1. Diastolic congestive heart failure. 2. Hypertension of asthma.  3. History of tobacco and alcohol use.  4. History of cocaine use.  5. History of pulmonary hypertension.   CONSULTATION:  1. Cardiology, Dr. Ubaldo Glassing.  2. Pulmonary, Dr. Mortimer Fries.   LABORATORY, DIAGNOSTIC AND RADIOLOGICAL DATA:   Cardiac catheterization on July 16th showed mild-to-moderate pulmonary hypertension. Normal coronaries. Normal LV function. No global or regional wall motion abnormality.  Chest x-ray on July 14th showed mild congestive heart failure.  Abdominal ultrasound on July 15th was unremarkable.   HISTORY AND SHORT HOSPITAL COURSE: The patient is a 49 year old female with the above-mentioned medical problems who was admitted for acute on chronic diastolic heart failure. He was started on diuresis. Please see Dr. Graciela Husbands dictated History and Physical for further details. The patient was evaluated by Pulmonary, Dr. Mortimer Fries, considering ongoing breathing difficulty. She was slowly improving.  Dr. Mortimer Fries recommended cardiac catheterization (left and right-sided) to evaluate for possible pulmonary hypertension for which Cardiology consultation was obtained with Dr. Ubaldo Glassing, who performed cardiac catheterization with results dictated above. The patient was slowly improving. She did have some shortness of breath but was much improved. She was not hypoxic and had some pleuritic chest pain which was also improving and  was stable enough to be discharged home on July 17th.    PERTINENT DISCHARGE PHYSICAL EXAMINATION: VITAL SIGNS: On the date of discharge, her vital signs were as follows: Temperature 97.8, heart rate 82 per minute, respirations 18 per minute, blood pressure 115/81 mmHg. She was saturating 100% on room air at rest and 95% on exertion. CARDIOVASCULAR: On the date of discharge, S1, S2 normal. Systolic ejection murmur present. No  rubs or gallops. LUNGS: Clear to auscultation bilaterally. No wheezing, rales, rhonchi, or crepitation. She did have some reproducible chest wall tenderness along the midsternal area. ABDOMEN: Soft, benign. NEUROLOGIC: Nonfocal examination. All other physical examination remained at baseline.    DISCHARGE MEDICATIONS:  1. Coreg 3.125 mg p.o. b.i.d.  2. Lisinopril 2.5 mg p.o. daily.  3. Tylenol 650 mg p.o. daily as needed.  4. Cyclobenzaprine 10 mg p.o. t.i.d.   5. Ibuprofen 800 mg p.o. t.i.d.   6. Nitroglycerin 0.4 mg sublingual every 5 to 10 minutes as needed.  7. Chlorphentermine/hydrocodone 5 mL p.o. b.i.d. as needed for 5 days.   DISCHARGE DIET: Low sodium.   DISCHARGE ACTIVITY: As tolerated.  DISCHARGE INSTRUCTIONS AND FOLLOW-UP:  1. The patient was instructed to follow-up with her primary care physician at [facility] in 1 to 2 weeks.  2. She will need follow-up with new pulmonary physician, Dr. Raul Del, in 2 to 4 weeks.  3. She was instructed to stop Imdur as she did not want to use that. She was feeling that she was getting a lot more side effects of Imdur.  4. She was also instructed to stop tramadol, which she was using at home.         TIME SPENT:   Total time discharging this patient was 55 minutes.  ____________________________ Lucina Mellow. Manuella Ghazi, MD  vss:cbb D: 06/15/2012 16:37:03 ET T: 06/16/2012 11:56:00 ET JOB#: [id number]  cc: Karinda Cabriales S. Manuella Ghazi, MD, <Dictator> [facility] Mariane Duval, MD Javier Docker. Ubaldo Glassing, MD Herbon E. Raul Del, [place] MD ELECTRONICALLY SIGNED 06/16/2012 22:48

## 2015-03-19 NOTE — H&P (Signed)
PATIENT NAME:  Chelsea Ramsey MR#:  [id number] DATE OF BIRTH:  1966/04/28  DATE OF ADMISSION:  09/12/2012  PRIMARY CARE PHYSICIAN: [facility]  REFERRING PHYSICIAN: Dr. Arman Filter  CHIEF COMPLAINT: Shortness of breath.   HISTORY OF PRESENT ILLNESS: The patient is a 49 year old African American female with history of diastolic congestive heart failure, asthma, and tobacco abuse who has had a couple of recent hospitalizations this year for shortness of breath. She has undergone an echocardiogram, Myoview, and left and right heart catheterization. The heart catheterization did occur in August of this year and showed no coronary artery disease, preserved ejection fraction, and mild to moderate pulmonary hypertension. The patient comes in today for two days of shortness of breath and dyspnea on exertion. She also had some fevers two days ago as high as 102, which have resolved. Her symptoms are also accompanied by a cough with some whitish sputum production and muscle aches. She denies having any sick contacts or taking recent antibiotics. She endorses some weight gain as well. She also complains of some nausea and vomiting since last night, about five episodes thus far. Here x-ray of the chest shows possible bronchitis without evidence of pneumonia. She has no fever or white count. Hospitalist services were contacted for further evaluation and management after the patient was given Lasix as well as a nitroglycerin patch.   PAST MEDICAL HISTORY:  1. Diastolic congestive heart failure.  2. Hypertension.  3. History of asthma.  4. History of tobacco abuse.  5. Former crack cocaine abuser.  6. History of aspirin allergy. 7. Pulmonary hypertension with severe mitral regurgitation on 2-D echocardiogram from 06/01/2012.   PAST SURGICAL HISTORY:  1. Right knee surgery. 2. Tonsillectomy.   ALLERGIES: Aspirin causing swelling.   HOME MEDICATIONS:  1. Acetaminophen/hydrocodone 325/5 mg, 1 tab  every six hours as needed for pain.  2. Albuterol CFC 90 mcg inhaled aerosol 2 puffs 4 times a day as needed for shortness of breath, of which she has run out. 3. Benadryl 25 mg every six hours as needed for itching. 4. Coreg 3.125 mg 2 times a day.  5. Ibuprofen  p.r.n.  6. Lisinopril 2.5 mg 1 tab once a day. 7. Nitroglycerin sublingual p.r.n.   FAMILY HISTORY: Bone cancer in mother, hypertension in mother. Aunt with diabetes.   SOCIAL HISTORY: Still has been smoking tobacco two cigarettes per week, trying to cut down. Occasional beer. No illicit drug use. However, urine toxicology has shown marijuana recently and history of distant crack cocaine in the past.     REVIEW OF SYSTEMS: CONSTITUTIONAL: Positive for fever and weight gain. EYES: No blurry vision or double vision. Some dizziness. ENT: No tinnitus or hearing loss. RESPIRATORY: Positive for cough, wheezing. No hemoptysis. Positive dyspnea on exertion. No painful respiration. CARDIOVASCULAR: Some chest pain with coughing. There is swelling in the legs. Dyspnea on exertion.  No palpitations or syncope. GI: Positive for nausea and vomiting. No diarrhea, abdominal pain, hematemesis, or melena. GU: Denies dysuria or hematuria. ENDOCRINE: No polyuria or nocturia. HEME/LYMPH: No anemia or easy bruising. SKIN: No new rashes. MUSCULOSKELETAL: Some muscle pains. NEUROLOGIC: No numbness or weakness. PSYCH: No anxiety or depression.   PHYSICAL EXAMINATION:  VITAL SIGNS: Temperature 98.5, pulse rate 95, respiratory rate 22, blood pressure 162/70,  oxygen saturation 99% on room air.   GENERAL: The patient is an obese African American female lying in bed in no obvious distress.   HEENT: Normocephalic, atraumatic. Pupils equal and reactive. Anicteric  sclerae. Moist mucous membranes.   NECK: Supple. No JVD. No thyroid tenderness.   CARDIOVASCULAR: S1, S2, regular rate and rhythm. Positive for murmur.   LUNGS: Clear to auscultation without wheezing,  rhonchi, or crackles.   ABDOMEN: Soft, nontender, nondistended. Positive bowel sounds.   EXTREMITIES: No significant lower extremity edema.   SKIN: No obvious rashes.   NEURO: Cranial nerves II through XII grossly intact. Strength is five out of five in all extremities.   PSYCH: Awake, alert, oriented times three. Pleasant, cooperative.   LABORATORY, DIAGNOSTIC, AND RADIOLOGICAL DATA: BNP 1485, glucose 99, creatinine 0.62, sodium 139, potassium 4. Troponin negative times one.  WBC 7.3, hemoglobin  12.9, platelets are 210. Urinalysis not suggestive of infection. EKG showing sinus tachycardia, rate 104, possible left atrial enlargement. No acute ST elevations or depression.   X-ray of the chest showing findings that could represent bronchitis. Edema is felt to be less likely.   ASSESSMENT AND PLAN: We have a 48 year old African female with a history of diastolic congestive heart failure with a history of cardiac catheterization three months ago showing some pulmonary hypertension, no coronary artery disease with preserved ejection fraction, tobacco abuse history, and asthma history who presents with shortness of breath, dyspnea on exertion with body aches, a fever two days ago, cough with whitish sputum, without evidence of pneumonia per x-ray of the chest. The patient does have elevated BNP as well. Her symptoms could be likely from a viral upper respiratory infection/bronchitis type of a picture. There is no pneumonia, congestive heart failure, or effusions per x-ray of the chest. She has no fever or leukocytosis. She also has had no sick contacts. I would start the patient on azithromycin nebulizers p.r.n. and check influenza. Also check sputum cultures. I doubt the patient is having acute congestive heart failure. She has no lower extremity edema or crackles in the chest or evidence of volume overload currently and she has been given Lasix as well. I would continue her beta blocker and lisinopril.  The  patient might need to be discharged on low dose Lasix. The patient does smoke tobacco as well and she was counseled for three minutes. I will apply a patch. I will check a u tox as well. We will start her on heparin for deep vein thrombosis prophylaxis. I will start her on Zofran p.r.n. for symptomatic relief of her nausea and vomiting, which could also be from the viral etiology.   CODE STATUS: Patient is a FULL CODE.   TOTAL TIME SPENT: 55 minutes.     ____________________________ Vivien Presto, MD sa:bjt D: 09/12/2012 14:33:42 ET T: 09/12/2012 15:03:27 ET JOB#: [id number]  cc: Vivien Presto, MD, <Dictator> [facility] MD ELECTRONICALLY SIGNED 09/13/2012 17:58

## 2015-03-19 NOTE — Consult Note (Signed)
General Aspect patient is a 49 year old female with history of tobacco abuse, COPD and former crack cocaine abuse who was admitted with progressive chest pain and shortness of breath.  Patient describes the chest pain is midsternal.  It is worse with deep breathing.  It is worse with deep palpation of her chest.  She also states she gets worse when she ambulates and breathes deeply.  She denies any prior cardiac history.  She is ruled out for myocardial infarction.  EKG is unremarkable for active ischemia. she has undergone a noninvasive cardiac evaluation with a functional study which was unremarkable for ischemia during previous admission in June. Echocardiogram during that admission revealed pulmonary hypertension. She states she is compliant with her medications and denies substance abuse. She is ruled out for myocardial infarction.   Physical Exam:   GEN well nourished, obese    HEENT PERRL    NECK supple    RESP normal resp effort  clear BS  rhonchi    CARD Regular rate and rhythm  Murmur    Murmur Systolic    Systolic Murmur axilla    ABD denies tenderness  normal BS    LYMPH negative neck    EXTR negative cyanosis/clubbing, negative edema    SKIN normal to palpation    NEURO cranial nerves intact, motor/sensory function intact    PSYCH A+O to time, place, person, poor insight   Review of Systems:   Subjective/Chief Complaint chest pain and shortness of breath    General: Fatigue    Skin: No Complaints    ENT: No Complaints    Eyes: No Complaints    Neck: No Complaints    Respiratory: Short of breath    Cardiovascular: Chest pain or discomfort    Gastrointestinal: No Complaints    Genitourinary: No Complaints    Vascular: No Complaints    Musculoskeletal: No Complaints    Neurologic: No Complaints    Hematologic: No Complaints    Endocrine: No Complaints    Psychiatric: No Complaints    Review of Systems: All other systems were reviewed and  found to be negative    Medications/Allergies Reviewed Medications/Allergies reviewed   Home Medications: Medication Instructions Status  IBU 800 mg oral tablet 1 tab(s) orally 3 times a day Active  cyclobenzaprine 10 mg oral tablet 1 tab(s) orally 3 times a day Active  traMADol 50 mg oral tablet 1 tab(s) orally every 4 hours Active  Coreg 3.125 mg oral tablet 1 tab(s) orally 2 times a day Active  lisinopril 2.5 mg oral tablet 1 tab(s) orally once a day Active  Imdur 30 mg oral tablet, extended release 1 tab(s) orally once a day (in the morning) Active  Tylenol 325 mg oral tablet 2 tab(s) orally , As Needed- for Pain  Active   EKG:   EKG NSR    Abnormal NSSTTW changes    Interpretation no ischemia    Aspirin: Swelling    Impression 49 year old female with history of cocaine abuse as well as tobacco abuse and COPD.  She was admitted with progressive chest pain and shortness of breath.  Pain is somewhat atypical for angina as was worse with deep palpation and chest pressure.  She ruled out for myocardial infarction.  she had admission approximately one month ago with similar symptoms. Echocardiogram at that time suggested preserved left ventricular function with evidence of pulmonary hypertension. Functional study with poor exercise tolerance revealed no ischemia. She is now readmitted with recurrent symptoms  of chest pain and shortness of breath. The symptoms are progressive and appear to be worsening. Etiology is unclear. Certainly occult coronary disease may be playing a role. Alternatively will need to evaluate extent of pulmonary hypertension to guide further therapy.    Plan 1.  Would continue with current medical regimen  2. Discontinue tobacco abuse 3. Continue to rule out for myocardial infarction 4. Risk and benefits of left and right cardiac catheterization explained to the patient and she agrees to proceed 5. Proceed with left cardiac and right cardiac catheter that he wait  extent pulmonary hypertension and to evaluate for coronary artery disease as etiology of her frequent admissions for chest pain 6. Proceed with intervention based on the results of the study.   Electronic Signatures: Teodoro Spray (MD)  (Signed 15-Jul-13 21:02)  Authored: General Aspect/Present Illness, History and Physical Exam, Review of System, Home Medications, EKG , Allergies, Impression/Plan   Last Updated: 15-Jul-13 21:02 by Teodoro Spray (MD)

## 2015-03-19 NOTE — Discharge Summary (Signed)
PATIENT NAME:  Chelsea Ramsey MR#:  [id number] DATE OF BIRTH:  [birth year]  DATE OF ADMISSION:  09/12/2012 DATE OF DISCHARGE:  09/15/2012  ADDENDUM  Please look at the previous discharge summary done for further details. Patient was going to be discharged on 10/116 although started developing nausea and was constipated, had not had a bowel movement. She was therefore given aggressive bowel regimen including a Dulcolax suppository, a Fleet's and started on lactulose. She had multiple bowel movements overnight. Her nausea is improved. She is taking p.o. well. She therefore now is being discharged home. Please look at the details for the entire hospital course and the previous discharge summary done by me on 10/16.   ____________________________ Belia Heman. Verdell Carmine, MD vjs:cms D: 09/15/2012 14:23:09 ET T: 09/16/2012 11:12:39 ET JOB#: [id number]  cc: Belia Heman. Verdell Carmine, MD, <Dictator> Henreitta Leber MD ELECTRONICALLY SIGNED 09/16/2012 12:22

## 2015-03-23 NOTE — Consult Note (Signed)
PATIENT NAME:  Chelsea Ramsey MR#:  [id number] DATE OF BIRTH:  1966/05/22  DATE OF CONSULTATION:  12/09/2013  CONSULTING PHYSICIAN:  Jerrol Banana. Burt Knack, MD  CHIEF COMPLAINT: Right upper quadrant pain and chest pain.   HISTORY OF PRESENT ILLNESS: This is a patient with congestive heart failure and shortness of breath who presents with new onset of chest pain and right upper quadrant pain. She is a 49 year old morbidly obese female with a history of COPD and tobacco abuse and CHF. She points to the right upper quadrant and to her left chest where her pain is bothering her the most and has been going on for at least 3 days. In the Emergency Room, she had a CT scan of the chest and abdomen, which showed an area of possible thickened gallbladder wall, and an ultrasound was performed that confirmed that. I was asked to see the patient for possible gallbladder disease.   The patient has never had an episode quite like this before.   PAST MEDICAL HISTORY: COPD and asthma, hypertension, morbid obesity, CHF.   PAST SURGICAL HISTORY: Diagnostic laparoscopy as a child for adhesions and tonsillectomy, adenoidectomy.   ALLERGIES: ASPIRIN.   MEDICATIONS: Multiple, see chart.   FAMILY HISTORY: Noncontributory.   SOCIAL HISTORY: The patient smokes tobacco. Drinks on occasion.   REVIEW OF SYSTEMS: Ten-system review has been performed and negative with the exception of that mentioned in the HPI.   PHYSICAL EXAMINATION: GENERAL: Morbidly obese female patient with a BMI of 44.  VITAL SIGNS: Temperature of 97.4, pulse of 96, respirations 18, blood pressure 117/85, 98% sat on 2 liters supplemental. HEENT: No scleral icterus.  NECK: No palpable neck nodes.  CHEST: Shows bilateral rhonchi.  CARDIAC: Regular rate and rhythm.  ABDOMEN: Soft. Minimally tender in the epigastrium, right upper quadrant, and left upper quadrant without a Murphy sign.  EXTREMITIES: Without edema.  NEUROLOGIC: Grossly intact.   INTEGUMENTARY: No jaundice.   LABORATORY AND RADIOLOGICAL DATA: Yesterday, liver function tests were elevated with a bilirubin of 2.3, alkaline phosphatase of 275, normal ALT but an AST of 50 and a serum albumin of 3.3. White blood cell count was 7.1, hemoglobin and hematocrit of 13 and 41. Electrolytes were within normal limits.   CT chest was normal. CT of the abdomen demonstrated diverticulosis, some free fluid in the right lower quadrant, possible mild gallbladder wall thickening. This precipitated an ultrasound performance, which demonstrated hepatomegaly, some gallbladder wall thickening, and equivocal Murphy sign.   HIDA scan was performed today that showed a normal ejection fraction, no reproduction of her pain, and a patent cystic duct.   ASSESSMENT AND PLAN: This is a patient with congestive heart failure, chronic obstructive pulmonary disease exacerbation. No evidence to suggest that this is gallbladder disease. I would be happy to follow the patient while she is in the hospital, but I doubt that these minimal findings on CT and ultrasound are responsible for her pain.   ____________________________ Jerrol Banana. Burt Knack, MD rec:jcm D: 12/09/2013 15:27:01 ET T: 12/09/2013 16:15:23 ET JOB#: [id number]  cc: Jerrol Banana. Burt Knack, MD, <Dictator> Florene Glen MD ELECTRONICALLY SIGNED 12/13/2013 11:48

## 2015-03-23 NOTE — H&P (Signed)
PATIENT NAME:  Chelsea Ramsey MR#:  [id number] DATE OF BIRTH:  06/01/1966  DATE OF ADMISSION:  12/08/2013  PRIMARY CARE PROVIDER: None.   EMERGENCY DEPARTMENT REFERRING PHYSICIAN: Dr. Thomasene Lot.   CHIEF COMPLAINT: Shortness of breath, right upper quadrant abdominal pain.   HISTORY OF PRESENT ILLNESS: The patient is a 49 year old African American female with history of COPD/asthma, continued nicotine use, history of diastolic CHF in the past, who presents to the ED with complaint of shortness of breath for the past 2 days. She has been coughing and wheezing and has had some subjective fevers and chills at home. She also complains of pain throughout her chest with coughing. The patient also states that at the same time she started developing right upper quadrant abdominal pain, which was sharp in nature, and is not able to eat. Because she came in with these symptoms in the ED, she underwent a CT scan of the chest, which showed abnormal abnormality in the gallbladder. Therefore, she underwent a right upper quadrant ultrasound, which suggests gallbladder wall thickening due to either fluid or acute cholecystitis. The ED physician spoke to Dr. Marina Gravel, who was on call for surgery, who will come evaluate the patient. She otherwise has not had any urinary symptoms. She does complain of some diarrhea. No nausea or vomiting.   PAST MEDICAL HISTORY:  1.  History of COPD/asthma.  2.  Hypertension.  3.  History of diastolic CHF.  4.  Continued nicotine abuse.   PAST SURGICAL HISTORY:  1.  Status post.   2.  Status post tonsillectomy.  3.  Status post laparoscopy.   ALLERGIES: ASPIRIN.   CURRENT MEDICATIONS AT HOME: She is on carvedilol 3.125 one tab p.o. b.i.d., Combivent 1 puff b.i.d., lisinopril 2.5 daily, nitroglycerin 0.4 sublingual p.r.n., Tylenol PM 2 tabs at bedtime, Ventolin 2 puffs 4 times a day as needed for shortness of breath.   FAMILY HISTORY: Positive for hypertension and diabetes.    SOCIAL HISTORY: Continues to smoke. She reports that she is only smoking a few cigarettes. Drinks occasionally. No drug use.   REVIEW OF SYSTEMS:    CONSTITUTIONAL: Complains of fever, fatigue, weakness, right upper quadrant abdominal pain. No weight loss. No weight gain.  EYES: No blurred or double vision. No pain. No redness. No inflammation. No glaucoma. No cataracts.  EARS, NOSE, THROAT: No tinnitus. No ear pain. No hearing loss. No seasonal or year-round allergies. No difficulty swallowing.  RESPIRATORY: Complaining of cough, wheezing. Has a history of COPD. CARDIOVASCULAR: Denies any chest pain or orthopnea. Complains of edema of the lower extremity. Complains of dyspnea on exertion.  GASTROINTESTINAL: Complains of nausea, diarrhea, right upper quadrant abdominal pain, sharp in nature, unrelated to food.  GENITOURINARY: Denies any dysuria, hematuria, renal calculus or frequency.  ENDOCRINE: Denies any polyuria, nocturia or thyroid problems.  HEMATOLOGIC AND LYMPHATIC: Denies anemia, easy bruisability or bleeding.  SKIN: No acne. No rash. No changes in mole, hair or skin.  MUSCULOSKELETAL: Denies any pain in the neck, back or shoulder.  NEUROLOGIC: No numbness. No CVA. No TIA or seizures.  PSYCHIATRIC: No anxiety. No insomnia. No ADD.   PERTINENT LABORATORIES AND EVALUATIONS: Admitting glucose 75. BNP is 432.  BUN 7, creatinine 0.55, sodium 136, potassium 3.8, chloride 104, CO2 of 23, calcium 8.4. LFTs are normal except albumin at 3.3, bilirubin total 2.3, bilirubin direct 1.2, alkaline phosphatase 275, AST 50, ALT 24. Troponin less than 0.02. WBC 7.1, hemoglobin 13.6, platelet count 165.   CT of the  chest per PE shows no evidence of pulmonary embolism. Mild reticulonodular opacification within the lungs, most prominent over the right middle and upper lobe; these findings may be due to infection, less likely due to inflammatory process. Mild ascites; most of the free fluid is in the right  lower quadrant. Also a suggestion of a mild gallbladder wall thickening, which is nonspecific.   Ultrasound of the abdomen shows gallbladder wall thickening with equivocal sonographic Murphy sign without any evidence of cholelithiasis.   ASSESSMENT AND PLAN: The patient is a 49 year old African American female with history of chronic obstructive pulmonary disease, diastolic congestive heart failure, presents with shortness of breath, cough, wheezing, swelling in the lower extremity, as well as right upper quadrant abdominal pain.  1.  Acute respiratory failure: Likely due to combination of chronic obstructive pulmonary disease flare, possible congestive heart failure. At this time, we will treat her with low-dose IV Lasix. Nebulizers, steroids, and IV antibiotics with IV Levaquin.  2.  Right upper quadrant abdominal pain: Possibly due to acute cholecystitis. HIDA scan. The ED physician has spoken to the surgeon. He will come see the patient. I will keep her n.p.o. for the time being.  3.  Hypertension: Continue carvedilol and lisinopril.  4.  Nicotine addiction: I discussed with the patient regarding smoking cessation, 3 minutes spent. Nicotine patch will be started.  5.  Miscellaneous: Will use heparin for deep vein thrombosis prophylaxis.  NOTE: 50 minutes spent on this patient.    ____________________________ Lafonda Mosses. Posey Pronto, MD shp:jcm D: 12/08/2013 21:44:15 ET T: 12/08/2013 21:52:40 ET JOB#: [id number]  cc: Marcha Licklider H. Posey Pronto, MD, <Dictator> Alric Seton MD ELECTRONICALLY SIGNED 12/19/2013 13:27

## 2015-03-23 NOTE — Consult Note (Signed)
Brief Consult Note: Diagnosis: abd pain.   Patient was seen by consultant.   Consult note dictated.   Recommend further assessment or treatment.   Orders entered.   Comments: No sign of acute cholecystitis or biliary dyskinesia. Will follow. No surgical indications at this time.  Electronic Signatures: Florene Glen (MD)  (Signed 10-Jan-15 14:05)  Authored: Brief Consult Note   Last Updated: 10-Jan-15 14:05 by Florene Glen (MD)

## 2015-03-23 NOTE — Consult Note (Signed)
PATIENT NAME:  Chelsea Ramsey MR#:  [id number] DATE OF BIRTH:  [birth year]  DATE OF CONSULTATION:  12/17/2013  CONSULTING PHYSICIAN:  Audree Camel. Charise Carwin, MD  REQUESTING PHYSICIAN: Dr. Leslye Peer  DIAGNOSIS: Generalized pain and weakness.   HISTORY OF PRESENT ILLNESS: Chelsea Ramsey a 49 year old black female with a known prior history of COPD, hypertension, tobacco abuse as well as heart failure, undergoing evaluation for generalized pain and weakness. History is according to the patient, who is a fair historian, and conversation with Dr. Leslye Peer, and review of the hospital chart.   Chelsea Ramsey was originally admitted to [facility] on 12/08/2013 for complaints of acute respiratory failure as well as management of her hypertension. Since admission, she has undergone ongoing management of this as well as a number of other issues, and has been treated for pneumonia, with pleurisy and heart failure. During her hospital stay, she has continued to have difficulties with complaints of generalized weakness, pain, dysphagia, and shortness of breath. She tells me that she has been having these problems probably for about a month that have involved weakness and pain in both legs as well as the right arm, but that these symptoms have worsened in the past week. It also involves pain in the right side of her head that she describes as a dull, shooting pain. The pain in her arms and legs is a "dead, throbbing pain." Generally, the pain does not change with the time of day, and is generally worse when she gets up and tries to move. She denies any bowel or bladder incontinence, or any prior similar episodes. She has never seen a neurologist for these issues. She also does note some numbness in her fingers of her right hand, calves, thighs, etc.   Since admission, she has undergone a very extensive neurologic workup, including MRI of her entire neuraxis, including her head, cervical, thoracic and  lumbar spine,  all of which have only revealed minor changes. She also had blood work, revealing unremarkable metabolic panel, with a glucose of 114, sedimentation rate was 1. Due to the positive findings and her ongoing complaints, Neurology is consulted for further evaluation.   PAST MEDICAL HISTORY: Notable for COPD/asthma, hypertension, history of diastolic heart failure, tobacco abuse, recently treated pneumonia as described above, with pleurisy, reflux disease. I do not see any noted history of diabetes, prior stroke, seizure, or kidney problems.   PAST SURGICAL HISTORY: Includes tonsillectomy, adenoidectomy,  laparoscopy secondary to adhesions.   MEDICATIONS: At this time include albuterol, Neurontin, Carafate, Pantoprazole, Coreg, lactulose, Levaquin, Lasix, Colace, Zestril, Habitrol, heparin, Tylenol, Zofran.   ALLERGIES: ASPIRIN.   SOCIAL HISTORY: She resides locally. Does use tobacco. Occasional alcohol.   FAMILY HISTORY: Reviewed and noncontributory.   REVIEW OF SYSTEMS:  Neurological review of systems as noted above.   PHYSICAL EXAMINATION: GENERAL: Reveals a pleasant, cooperative, obese black female who is in no acute distress.  VITAL SIGNS: She is afebrile. Vital signs stable. Blood pressure is 104/76.  NEUROLOGIC: She is alert at this time, eating potato chips, watching television. She is oriented x 3, with fluent speech. Able to name, repeat and follow commands, and other areas of high intellectual functioning appear to be intact. Her extraocular movements are intact. Pupils equal, round and reactive to light. Face is symmetric. Palate elevates symmetrically. Tongue protrudes midline. Otherwise, cranial nerves II through XII appear to be intact. Motor examination: She has normal bulk and tone, with essentially 5/5 strength throughout.  However, exam of the  legs is very limited due to pain. She has diffuse tenderness to palpation of practically all muscle groups. Sensory exam  reveals diminished pinprick, vibration and proprioception in a distally symmetric fashion to the knees and distal forearms bilaterally. Cerebellar exam:  On finger-to-nose, there is no evidence of pronator drift. Deep tendon reflexes appear to be intact. Grade 1+ at the arms and knees, potentially trace at the ankles, with toes downgoing.  CARDIOVASCULAR: Distant heart sounds, otherwise regular rate and rhythm. No evidence of carotid bruits. No evidence of cyanosis, clubbing or edema.  ABDOMEN: Soft, nontender, with normoactive bowel sounds.  LUNGS: Clear to auscultation.  SKIN: Exam reveals no obvious cuts, abrasions, bruises, or rashes.   DIAGNOSTIC STUDIES: Include head MRI studies as well as lab work as described above.   ASSESSMENT:  Generalized pain/leg weakness of unclear etiology.   DISCUSSION: At this time, Chelsea Ramsey presents with complaints of generalized pain, and numbness and weakness involving both legs and the right arm for a month, which has worsened in the past 2 weeks. She notes the pain is worse with activity and causes difficulties in ambulation. There is also an associated mild right-sided headache. Neurologic examination is notable for diffuse tenderness to palpation as well as potentially distally symmetric reduction of all sensory modalities. She has had extensive testing thus far, including MRI of her entire neuraxis that has been unremarkable. Overall etiology of her generalized pain and numbness is unclear, but given the paucity of findings on her exam and testing, will need to consider the possibility of a central pain syndrome, such as fibromyalgia. Other etiologies such as neuropathy, vasculitis, etc. could be considered, but are felt to be less likely.   RECOMMENDATIONS: Will check a serum CPK level. She would likely benefit from EMG/nerve conduction studies of the legs and one arm. If this is unable to be obtained as an inpatient, then could perhaps arrange outpatient  neurology visit for this testing. I would continue the use of Neurontin for her pain. She is currently on 200 mg t.i.d., which could be increased further if needed. Another alternative would be either to add Savella or change her Neurontin over to Lyrica for additional pain management. One could also consider lower extremity arterial Dopplers if there is any suspicion for claudication, although I anticipate this would be of low yield. I would continue PT, OT and speech therapy evaluations. I am off service later today, but can be reached by phone at any time. Dr. Melrose Nakayama of Neurology will be covering tomorrow.  Thank you very much for allowing me to participate in the care of this very interesting, pleasant patient.     ____________________________ Audree Camel. Charise Carwin, MD ray:mr D: 12/17/2013 13:03:40 ET T: 12/17/2013 18:13:44 ET JOB#: [id number]  cc: Herbie Baltimore A. Charise Carwin, MD, <Dictator> Cherre Robins MD ELECTRONICALLY SIGNED 01/20/2014 20:38

## 2015-03-23 NOTE — Consult Note (Signed)
Brief Consult Note: Diagnosis: Dysphagia.   Patient was seen by consultant.   Consult note dictated.   Orders entered.   Discussed with Attending MD.   Comments: Patient seen and evaluated. Admitted with COPD exacerbation and pneumonia with pleurisy. There was question on potential GB disease as well but HIDA was negative; surgery consulted and not currently planning surgery. She has been experiencing dysphasia for solids as well as carbonated beverages for 5-6 months, but she feels its gotten worse over the past 3-4 weeks. Feels food getting "stuck" substernally. Sometimes feels nauseated as a result. She also reports RUQ pain which is thought to be related to pleurisy. No history of EGD.   Agree with IV PPI. Will check a barium swallow to eval dysphagia. Pending findings, she may benefit from outpatient vs inpatient EGD. Pt is in agreement. discussed with Dr Rayann Heman, full consult being dictated.  Electronic Signatures: Sherald Barge (PA-C)  (Signed 13-Jan-15 15:21)  Authored: Brief Consult Note   Last Updated: 13-Jan-15 15:21 by Sherald Barge (PA-C)

## 2015-03-23 NOTE — Discharge Summary (Signed)
PATIENT NAME:  Chelsea Ramsey MR#:  [id number] DATE OF BIRTH:  May 14, 1966  DATE OF ADMISSION:  12/08/2013 DATE OF DISCHARGE:  12/18/2013  PRIMARY CARE PHYSICIAN: Will be at the [facility].    FINAL DIAGNOSES:  1. Pneumonia with pleuritic chest pain, chronic obstructive pulmonary disease exacerbation.  2. Muscle weakness.  3. Possible acute diastolic congestive heart failure.  4. Hypertension.  5. Cirrhosis, esophageal varices, gastric ulcers and dysphagia.  6. Tobacco abuse.   MEDICATIONS ON DISCHARGE: Include Percocet 10/325 one tablet every 6 hours as needed for pain, lisinopril 2.5 mg daily, gabapentin 300 mg 3 times a day, Coreg 6.25 mg twice a day, Combivent CFC 1 puff 4 times a day, Lasix 20 mg daily, Carafate 1 g 3 times a day before meals, nicotine patch 14 mg chest wall daily, omeprazole 20 mg twice a day, Qvar 80 mcg per inhalation 1 puff twice a day.   HOME HEALTH: Yes with physical therapy, help with strength.   HOME OXYGEN: No.   DIET: Low-sodium diet, regular consistency.   ACTIVITY: As tolerated.   FOLLOWUP: With Dr. Rayann Heman in 6 weeks for repeat endoscopy, Dr. Melrose Nakayama, neurology, for outpatient EMG, 2 to 4 weeks at the [facility].   HOSPITAL COURSE: The patient was admitted 12/08/2013, discharged 12/18/2013. The patient came in with shortness of breath, right upper quadrant abdominal pain. The patient was admitted with acute respiratory failure, COPD, possible congestive heart failure. Was started on nebulizers, steroids, antibiotics, Levaquin. The patient also had this right upper quadrant pain, rib pain. Dr. Burt Knack, surgery, saw the patient.   LABORATORY AND RADIOLOGICAL DATA DURING THE HOSPITAL COURSE: Included an EKG that showed sinus tachycardia, left atrial enlargement. Liver function tests: Total bilirubin 2.2, direct 1.2, alkaline phosphatase 275, ALT 24, AST 50. Beta-hCG 1. BNP 432. Glucose 75, BUN 7, creatinine 0.55, sodium 136, potassium 3.8, chloride  104, CO2 23, calcium 8.4. Troponin negative. White blood cell count 7.1, H and H 13.6 and 41.5, platelet count of 165. Chest x-ray showed moderate stable cardiomegaly. Mild prominence of the perihilar markings, possible vascular congestion. CT scan of the chest and abdomen showed no pulmonary embolism. Mild retronodular opacification of the lungs over the right middle lobe. May be due to infection or inflammatory process. Can follow up with CT scan in 6 to 8 weeks. Mild ascites. Free fluid. Ultrasound of the abdomen showed hepatomegaly with a tiny amount of ascites. Gallbladder wall thickening, equivocal. No evidence of cholelithiasis. TSH 0.561. LDL 50, HDL 31, triglycerides 50. HIDA scan negative. Ribs: No fracture. Barium swallow: No esophageal mass was demonstrated. No stricture demonstrated. Subtle mucosal irregularity thoracic esophagus, may reflect esophagitis. The barium pill lodges transiently in the proximal third of the thoracic esophagus and transiently in the distal esophagus but did pass with additional ingestion of water. MRI of the thoracic spine: Tiny central protrusion T8-9, noncompressive. MRI of the lumbar spine: Mild lumbar spondylosis. No compressive pathology. Protein electrophoresis: Low albumin at 3.0, low total protein at 5.9, immunoglobulin A slightly high at 487. Other normal range. CPK 26. Sedimentation rate 1. Alpha antitrypsin serum normal range at 150. MRI cervical spine: Moderate to severe motion artifact. Mild degenerative disk disease and facet arthrosis. Mild spinal canal narrowing at C3-C4 and C4-C5. MRI of the brain: No evidence of acute intracranial abnormality. Pulse ox 92% on room air.   CONSULTATIONS DURING THE HOSPITAL COURSE: Included Dr. Burt Knack from surgery, Dr. Rayann Heman, gastroenterology, Dr. Charise Carwin from neurology and also physical  therapy.   HOSPITAL COURSE PER PROBLEM LIST:  1. For the patient's, pneumonia with pleuritic chest pain, COPD exacerbation, initial  respiratory failure: the patient was started on antibiotics with IV Levaquin and IV Solu-Medrol and nebulizer treatments. The patient improved with regards to her respiratory status. No further need for oxygen at home. Upon discharge, pulse ox 92% on room air. The patient was not wheezing and moving better air. The patient finished a complete course of Levaquin during the hospital course and her steroids and steroid taper.  2. For her muscle weakness, I did MRI of her up and down. No real reason for her weakness. The patient initially told me bilateral legs, then right arm and then had right eye visual issues. I had neurology see her also. Dr. Charise Carwin recommended an outpatient EMG. I will have the patient follow up with Dr. Melrose Nakayama as outpatient for outpatient EMG and further evaluation.  3. Possible acute diastolic congestive heart failure: The patient with diuresed with IV Lasix and then switched over to oral Lasix. She is on lisinopril, Coreg and Lasix upon discharge.  4. Hypertension: Blood pressure stable during the hospital course. Upon discharge 104/72.  5. Cirrhosis, esophageal varices, gastric ulcers and dysphagia: The patient had a barium swallow and then an upper endoscopy showing some gastric ulcers. A repeat endoscopy will be needed in 6 weeks. The patient is on Carafate and omeprazole upon discharge.  6. Tobacco abuse: Smoking cessation counseling done during the hospital course.   This patient was in the hospital for quite a long time. Every day new complaints and more of the story. The patient was seen by nursing staff able to walk back and forth to the bathroom with the walker. The patient discharged home with home health and a walker.   TIME SPENT ON DISCHARGE: 35 minutes.   ____________________________ Tana Conch. Leslye Peer, MD [id number] D: 12/18/2013 16:56:15 ET T: 12/19/2013 04:12:31 ET JOB#: [id number]  cc: Tana Conch. Leslye Peer, MD, <Dictator> [facility] Arther Dames, MD Doneta Public. Melrose Nakayama, MD Marisue Brooklyn MD ELECTRONICALLY SIGNED 12/23/2013 10:31

## 2015-03-23 NOTE — Consult Note (Signed)
PATIENT NAME:  Chelsea Ramsey MR#:  [id number] DATE OF BIRTH:  August 23, 1966  DATE OF CONSULTATION:  12/12/2013  REFERRING PHYSICIAN:  Loletha Grayer, MD CONSULTING PHYSICIAN:  Arther Dames, MD / Corky Sox. Angeligue Bowne, PA-C  REASON FOR CONSULTATION: Dysphagia.   HISTORY OF PRESENT ILLNESS: This is a pleasant 49 year old African American female who initially was admitted on January 9th of 2015 for shortness of breath. Throughout her hospital course it has been determined this was likely COPD exacerbation with a right-sided pneumonia causing pleurisy. There is also question of having some underlying CHF exacerbation as well. Further work-up did reveal a thickened gallbladder wall on ultrasound and as a result surgery was consulted and HIDA scan was completed and was negative. They are not currently planning surgery at this time. Liver enzymes are currently within normal limits. Of note however she does report a 5 to 6 month history of dysphagia for solids with the sensation of food getting stuck substernally. There is also accompanying nausea with this. She feels as though the right upper quadrant abdominal pain began around the same time that her dysphagia started. She does feel as though over the past 3 to 4 weeks symptoms have gotten worse and worse and now she is actually afraid to eat because of the way it makes her feel. No actual vomiting. No black or bloody stools. She does complain of constipation and has been started on a bowel regimen while inpatient to try and help move her bowels. No prior history of an EGD or evaluation of her dysphagia. She does not take any PPI at home, but she was just started on IV PPI this hospitalization due to her complaints of dysphagia. She states she does not have much of an appetite, but she has been trying to eat as best as she can. No unintentional weight changes. No current chest pain or shortness of breath, but she is feeling right-sided rib pain which is thought to be  likely secondary to her known pleurisy.   PAST MEDICAL HISTORY: Chronic obstructive pulmonary disease, asthma, diastolic congestive heart failure, hypertension, and nicotine use.   PAST SURGICAL HISTORY: Tonsillectomy, adenoidectomy and laparoscopy secondary to adhesions.   ALLERGIES: ASPIRIN.   HOME MEDICATIONS: Lisinopril, nitroglycerin, Combivent, carvedilol, Tylenol, Ventolin.   FAMILY HISTORY: No known family history of GI malignancy, colon polyps, or IBD.   SOCIAL HISTORY: The patient does report smoking a few cigarettes per day. She reports occasional social alcohol use, but denies anything in excess. No illicit drug use. No significant NSAID use.   REVIEW OF SYSTEMS: A 10 system review of systems was obtained on the patient. She does report subjective fevers just prior to her admission. All other pertinent positives are mentioned above and otherwise negative.   OBJECTIVE: VITAL SIGNS: Blood pressure 101/77, heart rate 82, respirations 18, temp 97.6, bedside pulse ox 99%.  GENERAL: This is a pleasant 49 year old female resting quietly and comfortably in bed in no acute distress, alert and oriented x3.  HEAD: Atraumatic, normocephalic.  NECK: Supple. No lymphadenopathy noted.  HEENT: Sclerae anicteric. Mucous membranes moist.  PULMONARY: Respirations are even and unlabored. Quiet wheezes are noted in bilateral lung fields.  CARDIAC: Regular rate and rhythm. S1 and S2 noted.  ABDOMEN: Soft, nontender, nondistended. Normoactive bowel sounds noted in all 4 quadrants. Mild discomfort over the right rib cage. Negative Murphy's sign. No guarding or rebound. No masses, hernias, or organomegaly appreciated, although the exam is significantly limited secondary to obese habitus.  RECTAL: Deferred.  PSYCHIATRIC: Appropriate mood and affect. NEUROLOGIC: Normal gait and station. The patient is up and moving about in the room and is ambulating fine.  EXTREMITIES: Negative for lower extremity  edema, 2+ pulses noted in bilateral upper extremities.   LABORATORY AND DIAGNOSTICS: White blood cells 4.4, hemoglobin 13.5, hematocrit 40.7, platelets 150. Sodium 134, potassium 4.6, BUN 22, creatinine 0.79, glucose 106. Bilirubin at admission was 2.3, direct bilirubin 1.2, alk phos 275, AST 50, ALT 24. White blood cells 7.1, hemoglobin 13.6   Imaging: Ultrasound of the abdomen reveals gallbladder wall thickening with positive sonographic Murphy's sign. Negative for stones.   A CT of the chest was obtained on the patient showing opacification in the right middle and upper lung lobes. There is also mild ascites.   HIDA scan was obtained on the patient and was negative for acute cholecystitis. Ejection fraction was 51%.   ASSESSMENT: 1.  Chronic obstructive pulmonary disease/congestive heart failure exacerbation. Pulmonary status is improving and shortness of breath is almost entirely resolved.  2.  Right-sided pneumonia with pleurisy. The patient does continue to be somewhat tender over her right rib cage and with deep inspiration.  3.  Right upper quadrant abdominal pain, possibly related to pleurisy. Surgery was consulted and HIDA scan was completed and surgery is not planned at this time for her gallbladder.   4.  Dysphagia for solids. This has been going on for the past 5 to 6 months, but worsening over the past 3 to 4 weeks. There is associated nausea with this.   PLAN: I have discussed this patient's case in detail with Dr. Arther Dames who is involved in the development of the patient's plan of care. At this time, because of her ongoing dysphagia, I do agree with her being started on Protonix twice a day. We also discussed eating slowly, taking smaller bites and having a sip of water between each bite to try and alleviate the discomfort. This does sound more like a chronic issue, but because it has been getting progressively worse and she does have possible underlying pneumonia at the present  time, I do feel the appropriate next step would be to obtain a barium swallow. Certainly, pending those findings, she may be a candidate for an EGD, whether this be inpatient or outpatient. This plan was explained in detail with the patient and she verbalized understanding and all questions were answered. We will continue to monitor this patient closely and make further recommendations pending above and per clinical course.   Thank you so much for this consultation and for allowing [place] to participate in the patient's plan of care.  This services provided by Corky Sox. Pranav Lince, PA-C under collaborative agreement with Dr. Arther Dames.  ________________________ Corky Sox. Maris Bena, PA-C [id number] D: 12/12/2013 15:31:13 ET T: 12/12/2013 16:32:14 ET JOB#: [id number]  cc: Corky Sox. Jartavious Mckimmy, PA-C, <Dictator> [place] PA ELECTRONICALLY SIGNED 12/13/2013 8:46

## 2015-03-23 NOTE — Consult Note (Signed)
GI follow up. done today for dysphagia.   esophagus.  Biopsies taken to eval for eosinophilic esophagitis.  multiple ulcers, including 1 cm cratered ulcer in pyloric channel. ulcers biopsied, also biopsies for h pylori taken.   40 BID for 8 weeks, then 40 mg dailyrepeat EGD in 8 weeks to check for healingNo NSAIDScarafate 1 gr TID dissovled in 1 tablespoon of waterf/u esoph biopsiesif dysphagia continues despite PPI and biopsies unrevealing, will need esophageal manometry.   Electronic Signatures: Arther Dames (MD)  (Signed on 16-Jan-15 17:53)  Authored  Last Updated: 16-Jan-15 17:53 by Arther Dames (MD)

## 2015-03-24 NOTE — Consult Note (Signed)
    General Aspect patient is a 49 year old female with history of tobacco abuse, COPD and former crack cocaine abuse who was admitted with progressive chest pain and shortness of breath.  Patient describes the chest pain is midsternal.  It is worse with deep breathing.  It is worse with deep palpation of her chest.  She also states she gets worse when she ambulates and breathes deeply.  She denies any prior cardiac history.  She is ruled out for myocardial infarction.  EKG is unremarkable for active ischemia.   Physical Exam:   GEN obese    HEENT PERRL    NECK supple    RESP normal resp effort  clear BS    CARD Regular rate and rhythm  No murmur    ABD denies tenderness  normal BS    LYMPH negative neck    EXTR negative cyanosis/clubbing, negative edema    SKIN normal to palpation    NEURO cranial nerves intact, motor/sensory function intact    PSYCH A+O to time, place, person, anxious   Review of Systems:   Subjective/Chief Complaint chest pain and shortness of breath    General: Weakness    Skin: No Complaints    ENT: No Complaints    Eyes: No Complaints    Neck: No Complaints    Respiratory: Short of breath    Cardiovascular: Chest pain or discomfort    Gastrointestinal: No Complaints    Genitourinary: No Complaints    Vascular: No Complaints    Musculoskeletal: No Complaints    Neurologic: No Complaints    Hematologic: No Complaints    Endocrine: No Complaints    Psychiatric: No Complaints    Review of Systems: All other systems were reviewed and found to be negative    Medications/Allergies Reviewed Medications/Allergies reviewed   EKG:   Abnormal NSSTTW changes    Interpretation no ischemia    Aspirin: Swelling    Impression 49 year old female with history of cocaine abuse as well as tobacco abuse and COPD.  She was admitted with progressive chest pain and shortness of breath.  Pain is somewhat atypical for angina as was worse with deep  palpation and chest pressure.  She ruled out for myocardial infarction.  Echo revealed preserved left ventricular function with evidence of pulmonary hypertension.  She underwent a functional study today which was with poor exercise tolerance but no exercise-induced arrhythmia or ischemia.  Images revealed no ischemia.  Her symptoms appear to be nonischemic and likely secondary to obesity and pulmonary disease.  She appears hemodynamically stable at present    Plan 1.  Would continue with current medical regimen and ambulating consider discharge today as she does not appear to have acute ischemic coronary disease 2.  Weight loss 3.  Stop smoking 4.  He eliminate cocaine use 5.  Will followup as needed.  Son   Electronic Signatures: Teodoro Spray (MD)  (Signed 05-Jul-13 16:16)  Authored: General Aspect/Present Illness, History and Physical Exam, Review of System, EKG , Allergies, Impression/Plan   Last Updated: 05-Jul-13 16:16 by Teodoro Spray (MD)

## 2015-03-24 NOTE — H&P (Signed)
PATIENT NAME:  Chelsea Ramsey MR#:  [id number] DATE OF BIRTH:  1966-08-20  DATE OF ADMISSION:  06/01/2012  REFERRING PHYSICIAN: Dr. Jasmine December PRIMARY CARE PHYSICIAN: None  CHIEF COMPLAINT: "I couldn't breathe."   HISTORY OF PRESENT ILLNESS: Patient is a pleasant 49 year old female with past medical history as listed below including history of asthma, ongoing tobacco abuse, former history of crack cocaine abuse who presents to the Emergency Department with the above-mentioned chief complaint. Patient states she has had shortness of breath gradually worsening over the past three days to the point today where she had severe shortness of breath for which she came to the ER for further evaluation. She has also had three day history of chest pain over the past three days and reports pain in the substernal portion of her chest with radiation beneath the left breast associated with nausea and dizziness but no vomiting or diaphoresis and she has also had some shortness of breath as above. She reports cough productive of whitish mucus and occasional wheezing but no wheezing today. She denies fevers or chills. She denies any recent travel. She denies any sick contact exposure. She came to the ER for further evaluation and was noted to have pulmonary edema on her chest x-ray suggestive of congestive heart failure along with elevated BNP. She received 40 mg of IV Lasix and since that time her shortness of breath and chest pain have improved. Current chest pain is described as a pressure-like sensation rated at 3/10 currently down from a level of 7/10 earlier today before coming to the hospital. Otherwise, she is without complaints at this time and hospitalist services were contacted for further evaluation and for hospital admission.   PAST SURGICAL HISTORY:  1. Right knee surgery.  2. Tonsillectomy.   PAST MEDICAL HISTORY: 1. Asthma.  2. Tobacco abuse, ongoing. 3. Former crack cocaine abuse. Patient states she  has not used any since age 37.   ALLERGIES: Aspirin causes swelling.   HOME MEDICATION: Albuterol metered dose inhaler p.r.n., over the past three days she has been taking 2 puffs inhaled t.i.d.   FAMILY HISTORY: Mother alive with history of bone cancer. Patient is unaware of her biological father's medical history. Patient states there is no family history of heart disease that she is aware of.   SOCIAL HISTORY: Tobacco-Ongoing, currently smokes six cigarettes per day, in the past she was smoking 2 packs per day for about six years. She has been smoking since age 82. Alcohol-Occasionally drinks beer twice a week. Illicit drugs-None currently. Admits to smoking crack cocaine at age 10 and she has also smoked marijuana in the past but states she is no longer smoking marijuana. Denies use of IV drugs.     REVIEW OF SYSTEMS: CONSTITUTIONAL: Denies fevers, chills, recent changes in weight or weakness. Has some pain in her chest. HEAD/EYES: Denies headache or blurry vision. ENT: Denies tinnitus, earache, nasal discharge, or sore throat. RESPIRATORY: Has shortness of breath and cough. Denies wheezing or hemoptysis. CARDIOVASCULAR: Has chest pain. Denies orthopnea or lower extremity edema. Denies heart palpitations. GASTROINTESTINAL: Has some nausea. Denies vomiting, diarrhea, constipation, melena, hematochezia, abdominal pain. GENITOURINARY: Denies dysuria, hematuria. ENDOCRINE: Denies heat or cold intolerance. HEME/LYMPH: Denies easy bruising or bleeding. INTEGUMENTARY: Denies rash or lesions. MUSCULOSKELETAL: Has pain in her chest. Denies back pain or muscle weakness. NEUROLOGIC: Denies headache, numbness, weakness, tingling, or dysarthria. PSYCHIATRIC: Denies depression or anxiety.   PHYSICAL EXAMINATION:  VITAL SIGNS: Temperature 98.6, pulse 111, blood pressure 170/115, respirations 20,  oxygen saturation 97% on room air.   GENERAL: Patient alert and oriented, in mild respiratory distress. She is  tachypnea but able to speak in full sentences.   HEENT: Normocephalic, atraumatic. Pupils are equal, round and reactive to light accommodation. Extraocular muscles are intact. Anicteric sclerae. Conjunctiva pink. Hearing intact to voice. Nares without drainage. Oral mucosa is moist without lesions. Patient has poor dentition.   NECK: Supple with full range of motion. No jugular venous distention, lymphadenopathy, or carotid bruits bilaterally. No thyromegaly or tenderness to palpation over thyroid gland.   CHEST: Increased respiratory effort. Patient is tachypneic but not using accessory respiratory muscles to breathe. She has got bilateral rales without wheezing or rhonchi.   CARDIOVASCULAR: S1, S2 positive, tachycardic. No murmurs, rubs, or gallops. PMI is non- lateralized. There is mild tenderness to palpation over the anterior chest wall which reproduces the chest pain she has been feeling.   ABDOMEN: Soft, nontender, nondistended. Normoactive bowel sounds. No hepatosplenomegaly or palpable masses. No hernias.   EXTREMITIES: No clubbing, cyanosis, or edema. Pedal pulses are palpable bilaterally.   SKIN: No suspicious rashes. Skin turgor is good.   LYMPH: No cervical lymphadenopathy.   NEUROLOGICAL: Alert and oriented x3. Cranial nerves II through XII are grossly intact. No focal deficits.   PSYCH: Pleasant female with appropriate affect.   LABORATORY, DIAGNOSTIC, AND RADIOLOGICAL DATA:  Complete metabolic panel within normal limits.   CBC: WBC 8.3, hemoglobin 11.5, hematocrit 35.2, platelets 199.   Cardiac enzymes within normal limits. CK 124. CK-MB 1.6. Troponin less than 0.02.   BNP 1437.   Urine pregnancy test is pending.   Urine drug screen is pending.   Chest x-ray PA and lateral: Findings are consistent with congestive heart failure with pulmonary and interstitial edema.   EKG with sinus tachycardia, heart rate 108 beats per minute with normal axis, normal intervals, no  acute ST or T wave changes.   ASSESSMENT AND PLAN: 49 year old female with past medical history of ongoing tobacco abuse, asthma, occasional alcohol use and former crack cocaine abuse here with three day history of shortness of breath and chest pain, found to have pulmonary edema concerning for congestive heart failure on her chest x-ray as well as elevated BNP with EKG revealing sinus tachycardia without any acute ST or T wave changes with:  1. New onset congestive heart failure-Will admit patient to telemetry unit for further evaluation and management. Will obtain 2-D echocardiogram to assess her LVEF as well as status of her valves. Will cycle her cardiac enzymes, first set being negative. Check TSH. Exact etiology of congestive heart failure is unclear at this time. Also await urine drug screen. In regards to management will start diuresis with IV Lasix and monitor clinical response and also monitor ins and outs strictly as well as her daily weights. Also start low dose Coreg and ACE inhibitor therapy. Also provide low dose nitrate. Obtain cardiology consultation for further recommendations. Further work-up and management to follow depending on patient's clinical course.  2. Chest pain-Differential diagnoses includes chest pain due to congestive heart failure versus alternate cause such as ischemic heart disease versus PE as she is tachycardic. Will manage congestive heart failure as above. Will cycle patient's cardiac enzymes, first set being negative. She is allergic to aspirin. Could consider additional agent such as Plavix but will defer decision to cardiology and otherwise she has no other significant or known risk factors for coronary artery disease other than tobacco abuse. Will keep patient on empiric  Lovenox for now. Will obtain CT of the chest to assess for PE. Will defer further ischemic evaluation such as Myoview versus cardiac catheterization to cardiology. Will obtain 2-D echocardiogram as  above. Of note she also has a reproducible component of her chest pain which may represent costochondritis and will provide p.r.n. pain control. Otherwise, we will await further recommendations from cardiology. Further work-up and management to follow depending on patient's clinical course.  3. Asthma-Stable and without acute exacerbation. No wheezing or rhonchi head on physical examination. She is afebrile and with normal white count doubt pneumonia or bronchitis at this time. For now will write for p.r.n. albuterol metered dose inhaler and DuoNebs and will write for supplemental oxygen as needed, however, current oxygen sats are 97% on room air.  4. Tobacco abuse-Patient was strongly counseled on the importance of smoking cessation and approximately three minutes were spent in doing so. She is refusing a nicotine patch at this time.  5. Deep vein thrombosis prophylaxis-Lovenox. 6. CODE STATUS: FULL CODE.   TIME SPENT ON THIS ADMISSION:  Approximately 50 minutes.    ____________________________ Romie Jumper, MD knl:cms D: 06/01/2012 12:06:01 ET T: 06/01/2012 12:47:26 ET JOB#: [id number]  cc: Romie Jumper, MD, <Dictator> Romie Jumper MD ELECTRONICALLY SIGNED 06/10/2012 17:35

## 2015-03-24 NOTE — Discharge Summary (Signed)
PATIENT NAME:  Chelsea Ramsey MR#:  [id number] DATE OF BIRTH:  1966-10-26  DATE OF ADMISSION:  06/01/2012 DATE OF DISCHARGE:  06/03/2012  PRIMARY CARE PHYSICIAN: None at the time of admission. The patient is being referred to the [facility] for establishment and continuation of medical care.   REASON FOR ADMISSION: Shortness of breath.  DISCHARGE DIAGNOSES:  1. Acute diastolic congestive heart failure and pulmonary edema noted on chest x-rays felt to be due to malignant hypertension.  2. Malignant hypertension/new onset hypertension.  3. Chest pain felt to be musculoskeletal, possible costochondritis given reproducibility with negative cardiac enzymes and negative cardiac stress test.  4. History of asthma.  5. History of tobacco abuse. 6. History of occasional alcohol use. 7. History of former crack cocaine abuse.  8. History of aspirin allergy. 9. Mild to moderate tricuspid regurgitation with pulmonary hypertension with RV systolic pressure of 21-30 mmHg and moderate to severe mitral regurgitation noted on 2-D echocardiogram 06/01/2012.  CONSULTS: Cardiology, Dr. Ubaldo Glassing.   DISCHARGE DISPOSITION: Home.   DISCHARGE CONDITION: Improved, stable.   DISCHARGE MEDICATIONS:  1. Coreg 3.125 mg p.o. b.i.d.  2. Lisinopril 2.5 mg p.o. daily. 3. Imdur 30 mg p.o. daily. 4. Tylenol 325 mg 1 to 2 tablets p.o. every 4 to 6 hours p.r.n. pain.  5. Multivitamin 1 tablet p.o. daily.    DISCHARGE ACTIVITY: As tolerated.   DISCHARGE DIET: Low sodium, low fat, low cholesterol.  DISCHARGE INSTRUCTIONS: 1. Take medications as prescribed.  2. Return to Emergency Department for recurrence of symptoms or for development of shortness of breath or chest pain or for any swelling in the legs.   FOLLOWUP  INSTRUCTIONS: Follow up with the [facility] within 1 to 2 weeks. The patient needs repeat blood pressure check within one week.   PROCEDURES:  1. Chest x-ray PA and lateral 07/03/20132:  Findings consistent with congestive heart failure and pulmonary interstitial edema.  2. Left lower extremity venous Doppler ultrasound 06/01/2012: Negative for deep vein thrombosis. 3. CT of the chest with PE protocol 06/01/2012: No evidence of pulmonary embolus. Aorta is normal in caliber without evidence of dissection. There are trace bilateral pleural effusions.  4. Chest x-ray PA 06/03/2012: Improving appearance, decreasing interstitial edema with some continued pulmonary vascular congestion.  5. Treadmill Myoview 06/03/2012: Limited exercise tolerance. LV function normal. No evidence of ischemia., Ejection fraction 62%.  6. 2-D echocardiogram  06/01/2012: Left atrium is mildly dilated. LV systolic function normal. Ejection fraction greater than 55%, mild to moderate TR. Right ventricular systolic pressure is elevated at 50 to 60 mmHg. Mitral valve is grossly normal. Moderate to severe mitral regurgitation.  The aortic valve is normal in structure and function. No pericardial effusion.  PERTINENT LABORATORY DATA:  EKG on admission:  Sinus tachycardia, heart rate 108 beats per minute with normal axis, normal intervals, no acute ST or T wave changes.   Cardiac enzymes are negative times three sets with normal CK and CK-MB and troponin levels.   TSH 2.67.   Urine drug screen is positive for cannabinoids.   CBC on admission: WBC 8.3, hemoglobin 11.5, hematocrit 35.2, platelets 199, hemoglobin 11.8 from 06/02/2012.   BNP 1437 on admission. Renal function normal on admission. BUN 9, creatinine 0.78. Creatinine 0.65 from 06/03/2012. Hemoglobin A1c 4.5. Lipid panel: Total cholesterol 145, triglycerides 106, HDL 66, LDL 58.   BRIEF HISTORY/ HOSPITAL COURSE: The patient is a pleasant 49 year old female with past medical history of ongoing tobacco abuse, asthma, occasional alcohol  use, and former crack cocaine abuse who  presented to the Emergency Department with complaints of shortness of breath and  chest pain. Please see dictated admission History and Physical for pertinent details surrounding the onset of this hospitalization.  Please see below for further details.  1. Shortness of breath, chest pain: Chest x-ray was notable for pulmonary edema, concerning for congestive heart failure. She also had elevated BNP and initial EKG was sinus tachycardia without acute ST or T wave changes and her first set of cardiac enzymes was negative. Thereafter, the patient was admitted to the telemetry unit for further evaluation and management.  2. New onset congestive heart failure: This was felt to be acute diastolic congestive heart failure in the setting of malignant hypertension as the patient presented with significantly elevated blood pressure. She had no history of hypertension in the past and has been diagnosed with new onset hypertension. Pulmonary edema was noted on chest x-ray as above and her BNP was elevated. She was initially maintained on supplemental oxygen via nasal cannula to help with her shortness of breath and was started on diuresis with IV Lasix. She was also maintained on nitrate, beta blocker, and was started on ACE inhibitor therapy. She was started on gradual blood pressure reduction with these medications and blood pressure is now much better controlled and at goat at the time of discharge.  Repeat chest x-ray showed improvement of her interstitial edema. She has been successfully weaned off oxygen and has normal oxygen saturations at this time on room air. With diuresis her shortness of breath has resolved. The patient has diuresed well with a net negative balance of two liters thus far with resolution of her shortness of breath thereafter. She appears to be euvolemic at the time of discharge. No peripheral edema and shortness of breath has resolved. Therefore diuretic therapy has been discontinued. Her cardiac enzymes are negative times three sets. Her TSH is within normal limits. She will  need aggressive blood pressure control for which she will be on Coreg, ACE inhibitor, and nitrate therapy for now. In regards to her chest pain, this was reproducible with palpation and she was felt to have costochondritis for which she was given pain control. She is allergic to aspirin. We limited narcotics. Chest pain has significantly improved and she was advised a trial of Tylenol the time of discharge to help with her chest pain until it resolves. She has ruled out for myocardial infarction based on 3 negative sets of cardiac enzymes. She was thereafter sent for a Myoview, which was negative for ischemia. She was seen by cardiology with Dr. Ubaldo Glassing who was in agreement with overall medical management plan and agreed that the patient likely had acute diastolic congestive heart failure and malignant hypertension. Dr. Ubaldo Glassing recommended aggressive blood pressure management and control. Echocardiogram was obtained and the patient had moderate to severe MR and some pulmonary hypertension, but normal LVEF. The patient was advised that she can follow up with Dr. Ubaldo Glassing of cardiology if she desires; however, she stated that she has some financial difficulties and would like to follow up with the [facility] for now.  3. Malignant hypertension: She was started on gradual blood pressure reduction and blood pressure is much improved now and at goal with Coreg, lisinopril, and nitrate therapy which she will continue as an outpatient and will need close outpatient followup at the [facility] for blood pressure management. She had one isolated episode of hyperglycemia and this was  postprandial. There is no evidence of diabetes mellitus currently given hemoglobin A1c of 4.5.  4. Tobacco abuse:  She was strongly counseled on the importance of smoking cessation and urine drug screen was positive for cannabinoids. She was also counseled and advised on the importance of avoiding smoking any marijuana.   On 06/03/2012 the  patient was hemodynamically stable without any chest pain or shortness of breath when I rounded on her, and her blood pressure was at goal. She was felt to be medically stable for discharge home with close outpatient followup to which the patient was agreeable, and it was also felt to be safe from a cardiology perspective to have the patient discharged home. Thereafter she was discharged home in satisfactory condition.  TIME SPENT ON DISCHARGE:  Greater than 30 minutes.  ____________________________ Romie Jumper, MD knl:bjt D: 06/07/2012 19:04:40 ET T: 06/08/2012 12:09:59 ET JOB#: [id number]  cc: Romie Jumper, MD, <Dictator> [facility] Romie Jumper MD ELECTRONICALLY SIGNED 06/10/2012 17:35

## 2015-03-24 NOTE — H&P (Signed)
PATIENT NAME:  Chelsea Ramsey MR#:  [id number] DATE OF BIRTH:  [birth year]  DATE OF ADMISSION:  06/12/2012   REFERRING PHYSICIAN: Dr. Corky Downs   PRIMARY CARE PHYSICIAN: None.   CHIEF COMPLAINT: Chest pain and shortness of breath.   HISTORY OF PRESENT ILLNESS: This is a 49 year old female with past medical history of asthma, tobacco abuse, former history of crack cocaine abuse and was recently admitted where she was discharged last week with the diagnosis of new onset diastolic CHF and pulmonary hypertension where she had negative Myoview stress test done and negative CT angiogram for PE. The patient presents with complaints mainly of shortness of breath developed over the last two days mainly upon laying supine and exertional. Denies any cough, any productive sputum, as well complaining of chest pain. The patient reports this chest pain resembled chest pain during last admission which was atypical most likely due to costochondritis where she had negative Myoview stress test done and negative troponin. The patient has aspirin allergy and is not on aspirin. In the ED the patient had chest x-ray done where she was found to have elevated BNP and pulmonary edema on x-ray where she was given IV Lasix with mild improvement of her symptoms but she could not tolerate ambulating without oxygen where her oxygen dropped to 88. The patient's EKG did not show any significant ST or T wave changes.   PAST MEDICAL HISTORY:  1. Diastolic congestive heart failure.  2. Hypertension.  3. History of asthma.  4. History of tobacco abuse.  5. History of occasional alcohol use.  6. Former crack cocaine abuse. 7. History of aspirin allergy.  8. Pulmonary hypertension with right ventricular systolic pressure of 50 to 60 mmHg with severe mitral regurgitation on 2-D echo 06/01/2012.   PAST SURGICAL HISTORY:  1. Right knee surgery.  2. Tonsillectomy.   HOME MEDICATIONS:  1. Coreg 3.125 mg oral 2 times a day.   2. Lisinopril 2.5 mg daily.  3. Imdur 30 mg daily.  4. Tylenol 325 1 to 2 tablets every 4 to 6 hours as needed.  5. Multivitamin 1 tablet oral daily.   FAMILY HISTORY: Significant for bone cancer in mother. No family history of heart disease she is aware of.   SOCIAL HISTORY: Ongoing tobacco, currently smokes up to half a pack per day. Drinks alcohol occasionally 1 to 2 beers every week. No current illicit drug use. History of smoking crack cocaine at age of 16. No history of IV drug abuse.   REVIEW OF SYSTEMS: Denies any fever, chills, recent changes in weight or weakness. EYES: Denies blurry vision, double vision, eye pain. ENT: Denies tinnitus, ear pain, hearing loss. RESPIRATORY: Denies any cough, wheezing, or hemoptysis. Complains of shortness of breath, mainly exertional and lying supine. CARDIOVASCULAR: Complains of chest pain, atypical, reproducible by palpation. Denies any edema, arrhythmia, palpitations, syncope. GI: Denies nausea, vomiting, diarrhea, abdominal pain. GU: Denies dysuria, hematuria, renal colic. ENDOCRINE: Denies polyuria, polydipsia, heat or cold intolerance. HEMATOLOGY: Denies easy bruising, bleeding diathesis. INTEGUMENTARY: Denies acne, rash. MUSCULOSKELETAL: Denies neck pain, shoulder pain, back pain. NEUROLOGIC: Denies numbness, dysarthria, epilepsy, tremors, vertigo. PSYCH: Denies any anxiety, insomnia, bipolar disorder.   PHYSICAL EXAMINATION:   VITAL SIGNS: Temperature 98, pulse 95, respiratory rate 22, blood pressure 153/68, saturating 98%.   GENERAL: Well nourished female who is comfortable in bed in no apparent distress.   HEENT: Head atraumatic, normocephalic. Pupils equal, reactive to light. Pink conjunctivae. Anicteric sclerae. Moist oral mucosa.   NECK: Supple.  No thyromegaly. No JVD.   CHEST: Good air entry bilaterally. Bibasilar crackles. No wheezing.   CARDIOVASCULAR: S1, S2 heard. Mild murmur. No rubs. No gallops.   ABDOMEN: Soft, nontender,  nondistended. Bowel sounds present.   EXTREMITIES: No edema. No clubbing. No cyanosis.   PSYCHIATRIC: Appropriate affect. Awake, alert x3. Intact judgment and insight. Motor 5 out of 5. Cranial nerves grossly intact.   SKIN: No rash.   PERTINENT LABS: BNP 2102. Glucose 113, BUN 10, creatinine 0.67, sodium 143, potassium 3.8, chloride 107, CO2 23, total protein 7.3, total bilirubin 0.3. Troponin less than 0.02. Hemoglobin 11, hematocrit 33.9, white blood cells 8.8, platelets 188.   ASSESSMENT AND PLAN:  1. Acute respiratory failure. This appears to be multifactorial mainly due to acute diastolic CHF and as well due to pulmonary hypertension. The patient has elevated BNP with evidence of pulmonary edema on chest x-ray. Will start on IV Lasix 40 mg oral 2 times a day and probably will discharge her on oral Lasix. Will consult Pulmonary service for evaluation of her pulmonary hypertension. The patient is desaturating upon ambulation so most likely will need home oxygen. Will consult Case Management to arrange for home oxygen as she desaturated to 88% upon ambulation.  2. Tobacco abuse. The patient was counseled.  3. Hypertension. Will resume lisinopril, Imdur, and Coreg.  4. History of asthma. There is no wheezing. Will have her on p.r.n. Albuterol.  5. Chest pain. This is atypical chest pain reproducible by palpation,  The patient just had negative Myoview stress test. Has allergy to aspirin. Will continue her on Coreg. Will cycle three sets of troponins.  6. DVT prophylaxis. Sub-Q heparin.   CODE STATUS: The patient is FULL CODE.      TOTAL TIME SPENT ON PATIENT CARE AND ADMISSION: 50 minutes.   ____________________________ Albertine Patricia, MD dse:drc D: 06/12/2012 09:06:17 ET T: 06/12/2012 10:25:29 ET JOB#: [id number]  cc: Albertine Patricia, MD, <Dictator> Tylisha Danis Graciela Husbands MD ELECTRONICALLY SIGNED 06/13/2012 0:29

## 2015-04-15 ENCOUNTER — Ambulatory Visit: Admission: RE | Admit: 2015-04-15 | Payer: Self-pay | Source: Ambulatory Visit

## 2015-04-23 ENCOUNTER — Encounter: Payer: Self-pay | Admitting: Pharmacist

## 2015-05-08 DIAGNOSIS — F17200 Nicotine dependence, unspecified, uncomplicated: Secondary | ICD-10-CM | POA: Insufficient documentation

## 2015-06-08 ENCOUNTER — Other Ambulatory Visit: Payer: Self-pay

## 2015-06-08 ENCOUNTER — Emergency Department
Admission: EM | Admit: 2015-06-08 | Discharge: 2015-06-08 | Disposition: A | Discharge status: Home / Self Care | Payer: Medicaid Other | Attending: Emergency Medicine | Admitting: Emergency Medicine

## 2015-06-08 ENCOUNTER — Emergency Department: Payer: Medicaid Other

## 2015-06-08 ENCOUNTER — Encounter: Payer: Self-pay | Admitting: Emergency Medicine

## 2015-06-08 DIAGNOSIS — M25511 Pain in right shoulder: Secondary | ICD-10-CM | POA: Diagnosis not present

## 2015-06-08 DIAGNOSIS — T8584XA Pain due to internal prosthetic devices, implants and grafts, not elsewhere classified, initial encounter: Secondary | ICD-10-CM | POA: Diagnosis not present

## 2015-06-08 DIAGNOSIS — Z720 Tobacco use: Secondary | ICD-10-CM | POA: Diagnosis not present

## 2015-06-08 DIAGNOSIS — Y840 Cardiac catheterization as the cause of abnormal reaction of the patient, or of later complication, without mention of misadventure at the time of the procedure: Secondary | ICD-10-CM | POA: Diagnosis not present

## 2015-06-08 DIAGNOSIS — R0789 Other chest pain: Secondary | ICD-10-CM | POA: Diagnosis present

## 2015-06-08 HISTORY — DX: Heart failure, unspecified: I50.9

## 2015-06-08 HISTORY — DX: Pulmonary hypertension, unspecified: I27.20

## 2015-06-08 HISTORY — DX: Fibromyalgia: M79.7

## 2015-06-08 HISTORY — DX: Unspecified asthma, uncomplicated: J45.909

## 2015-06-08 MED ORDER — HYDROMORPHONE HCL 1 MG/ML IJ SOLN
1.0000 mg | Freq: Once | INTRAMUSCULAR | Status: AC
  Administered 2015-06-08: 1 mg via INTRAMUSCULAR

## 2015-06-08 MED ORDER — HYDROMORPHONE HCL 1 MG/ML IJ SOLN
INTRAMUSCULAR | Status: AC
  Administered 2015-06-08: 1 mg via INTRAMUSCULAR
  Filled 2015-06-08: qty 1

## 2015-06-08 MED ORDER — OXYCODONE-ACETAMINOPHEN 5-325 MG PO TABS: 1.0000 | ORAL_TABLET | Freq: Four times a day (QID) | ORAL | Status: DC | PRN

## 2015-06-08 NOTE — ED Notes (Signed)
MD at bedside. 

## 2015-06-08 NOTE — ED Notes (Signed)
Right shoulder pain radiating to right lateral neck and also down right arm , no injury , no shortness of breath, no dizziness. Had a cardiac cath last week for pre-op for mitral valve replacement

## 2015-06-08 NOTE — ED Notes (Addendum)
Reports chest pain and right shoulder painx 6 wks, cardiac cath last week

## 2015-06-08 NOTE — Discharge Instructions (Signed)
Arthralgia Arthralgia is joint pain. A joint is a place where two bones meet. Joint pain can happen for many reasons. The joint can be bruised, stiff, infected, or weak from aging. Pain usually goes away after resting and taking medicine for soreness.  HOME CARE  Rest the joint as told by your doctor.  Keep the sore joint raised (elevated) for the first 24 hours.  Put ice on the joint area.  Put ice in a plastic bag.  Place a towel between your skin and the bag.  Leave the ice on for 15-20 minutes, 03-04 times a day.  Wear your splint, casting, elastic bandage, or sling as told by your doctor.  Only take medicine as told by your doctor. Do not take aspirin.  Use crutches as told by your doctor. Do not put weight on the joint until told to by your doctor. GET HELP RIGHT AWAY IF:   You have bruising, puffiness (swelling), or more pain.  Your fingers or toes turn blue or start to lose feeling (numb).  Your medicine does not lessen the pain.  Your pain becomes severe.  You have a temperature by mouth above 102 F (38.9 C), not controlled by medicine.  You cannot move or use the joint. MAKE SURE YOU:   Understand these instructions.  Will watch your condition.  Will get help right away if you are not doing well or get worse. Document Released: 11/04/2009 Document Revised: 02/08/2012 Document Reviewed: 11/04/2009 [facility] Patient Information 2015 [place], [place]. This information is not intended to replace advice given to you by your health care provider. Make sure you discuss any questions you have with your health care provider.   Please call the number provided to follow-up with orthopedics as soon as possible. Return to the emergency department for any chest pain, shortness breath, or any worsening of pain to right shoulder.

## 2015-06-08 NOTE — ED Notes (Signed)
Patient transported to X-ray 

## 2015-06-08 NOTE — ED Provider Notes (Signed)
[facility] Emergency Department Provider Note  Time seen: 10:51 AM  I have reviewed the triage vital signs and the nursing notes.   HISTORY  Chief Complaint Chest Pain    HPI Chelsea Ramsey is a 49 y.o. female with a past medical history of asthma, CHF, fibromyalgia, pulmonary hypertension presents to the emergency department with right shoulder pain 6 days. According to the patient for the past 6 days she has had significant pain in the right shoulder much worse when she tries to move or lift anything with the arm. Denies any chest pain, shortness of breath. Of note the patient does state she had a cardiac catheterization approximately 2 weeks ago as part of a preop for a mitral valve repair. Patient describes her right shoulder pain as 10/10 when attempting to move. Much worse with movement. Dull/aching pain. Minimal discomfort while resting the shoulder. Denies any weakness or numbness of the arm. States normal sensation.     Past Medical History  Diagnosis Date  . Asthma   . CHF (congestive heart failure)   . Fibromyalgia   . Pulmonary HTN     There are no active problems to display for this patient.   Past Surgical History  Procedure Laterality Date  . Tonsillectomy      No current outpatient prescriptions on file.  Allergies Aspirin; Morphine and related; Codeine; and Tramadol  History reviewed. No pertinent family history.  Social History History  Substance Use Topics  . Smoking status: Current Every Day Smoker  . Smokeless tobacco: Not on file  . Alcohol Use: Yes    Review of Systems Constitutional: Negative for fever. Cardiovascular: Negative for chest pain. Respiratory: Negative for shortness of breath. Gastrointestinal: Negative for abdominal pain, vomiting and diarrhea. Musculoskeletal: Negative for back pain. Positive for right shoulder pain. Skin: Negative for rash. Neurological: Negative for headaches, focal weakness  or numbness.  10-point ROS otherwise negative.  ____________________________________________   PHYSICAL EXAM:  VITAL SIGNS: ED Triage Vitals  Enc Vitals Group     BP 06/08/15 1020 131/91 mmHg     Pulse Rate 06/08/15 1020 89     Resp 06/08/15 1020 18     Temp 06/08/15 1020 97.9 F (36.6 C)     Temp Source 06/08/15 1020 Oral     SpO2 06/08/15 1020 100 %     Weight 06/08/15 1020 233 lb (105.688 kg)     Height 06/08/15 1020 5\' 5"  (1.651 m)     Head Cir --      Peak Flow --      Pain Score 06/08/15 1040 9     Pain Loc --      Pain Edu? --      Excl. in [place]? --     Constitutional: Alert and oriented. Well appearing and in no distress. ENT   Mouth/Throat: Mucous membranes are moist. Cardiovascular: Normal rate, regular rhythm. No murmur Respiratory: Normal respiratory effort without tachypnea nor retractions. Breath sounds are clear and equal bilaterally.  Gastrointestinal: Soft and nontender. No distention.  Musculoskeletal: Significant tenderness to palpation of the right shoulder, significant discomfort with range of motion of right shoulder. Good range of motion in the elbow without pain, neurovascularly intact distally with normal sensation. 2+ radial pulse. No chest tenderness to palpation. Neurologic:  Normal speech and language. No gross focal neurologic deficits are appreciated. Speech is normal. Skin:  Skin is warm, dry and intact.  Psychiatric: Mood and affect are normal. Speech and  behavior are normal. ____________________________________________     RADIOLOGY  X-rays are largely within normal limits  ____________________________________________   INITIAL IMPRESSION / ASSESSMENT AND PLAN / ED COURSE  Pertinent labs & imaging results that were available during my care of the patient were reviewed by me and considered in my medical decision making (see chart for details).  Patient with 6 days of right shoulder pain. Denies chest pain contrary to triage  note. She had a cardiac catheterization last week but this was a preoperative catheter for a mitral valve repair, it was not performed for chest pain per patient. Patient states the cath showed normal results as well.  Patient pain appears to be very much musculoskeletal. We will x-ray to help further evaluate.  X-ray within normal limits. We'll discharge the patient home with a short course of pain medication and orthopedic follow-up. The patient is agreeable.  ____________________________________________   FINAL CLINICAL IMPRESSION(S) / ED DIAGNOSES  Right shoulder pain   Harvest Dark, MD 06/08/15 1220

## 2015-07-03 LAB — COMPREHENSIVE METABOLIC PANEL
ALT: 32 U/L
Albumin: 3.8 g/dL
Alkaline Phosphatase: 158 U/L — ABNORMAL HIGH
Anion Gap: 8 (ref 7–16)
BILIRUBIN TOTAL: 1 mg/dL
BUN: 14 mg/dL
CHLORIDE: 104 mmol/L
CO2: 26 mmol/L
Calcium, Total: 9 mg/dL
Creatinine: 0.58 mg/dL
EGFR (African American): >60
EGFR (Non-African Amer.): >60
GLUCOSE: 97 mg/dL
Potassium: 3.7 mmol/L
SGOT(AST): 41 U/L
SODIUM: 138 mmol/L
Total Protein: 7.5 g/dL

## 2015-07-03 LAB — URINALYSIS, COMPLETE
BLOOD: NEGATIVE
Bacteria: NONE SEEN
Bilirubin,UR: NEGATIVE
Glucose,UR: NEGATIVE mg/dL (ref 0–75)
Ketone: NEGATIVE
Nitrite: NEGATIVE
Ph: 7 (ref 4.5–8.0)
Protein: NEGATIVE
SPECIFIC GRAVITY: 1.016 (ref 1.003–1.030)

## 2015-07-03 LAB — CBC WITH DIFFERENTIAL/PLATELET
BASOS ABS: 0 10*3/uL (ref 0.0–0.1)
Basophil %: 0.4 %
EOS ABS: 0.1 10*3/uL (ref 0.0–0.7)
Eosinophil %: 1.3 %
HCT: 41.4 % (ref 35.0–47.0)
HGB: 13.3 g/dL (ref 12.0–16.0)
LYMPHS PCT: 18 %
Lymphocyte #: 1.4 10*3/uL (ref 1.0–3.6)
MCH: 29 pg (ref 26.0–34.0)
MCHC: 32.1 g/dL (ref 32.0–36.0)
MCV: 90 fL (ref 80–100)
MONOS PCT: 8.8 %
Monocyte #: 0.7 x10 3/mm (ref 0.2–0.9)
Neutrophil #: 5.5 10*3/uL (ref 1.4–6.5)
Neutrophil %: 71.5 %
Platelet: 182 10*3/uL (ref 150–440)
RBC: 4.59 10*6/uL (ref 3.80–5.20)
RDW: 13.7 % (ref 11.5–14.5)
WBC: 7.7 10*3/uL (ref 3.6–11.0)

## 2015-07-05 ENCOUNTER — Emergency Department: Payer: Medicaid Other

## 2015-07-05 ENCOUNTER — Encounter: Payer: Self-pay | Admitting: Emergency Medicine

## 2015-07-05 ENCOUNTER — Emergency Department
Admission: EM | Admit: 2015-07-05 | Discharge: 2015-07-05 | Disposition: A | Discharge status: Home / Self Care | Payer: Medicaid Other | Attending: Emergency Medicine | Admitting: Emergency Medicine

## 2015-07-05 DIAGNOSIS — Z720 Tobacco use: Secondary | ICD-10-CM | POA: Diagnosis not present

## 2015-07-05 DIAGNOSIS — M25561 Pain in right knee: Secondary | ICD-10-CM | POA: Diagnosis present

## 2015-07-05 DIAGNOSIS — M170 Bilateral primary osteoarthritis of knee: Secondary | ICD-10-CM | POA: Insufficient documentation

## 2015-07-05 HISTORY — DX: Atherosclerotic heart disease of native coronary artery without angina pectoris: I25.10

## 2015-07-05 MED ORDER — PREDNISONE 10 MG PO TABS: 10.0000 mg | ORAL_TABLET | Freq: Every day | ORAL | Status: DC

## 2015-07-05 MED ORDER — HYDROCODONE-ACETAMINOPHEN 5-325 MG PO TABS: 1.0000 | ORAL_TABLET | Freq: Four times a day (QID) | ORAL | Status: DC | PRN

## 2015-07-05 MED ORDER — HYDROCODONE-ACETAMINOPHEN 5-325 MG PO TABS
1.0000 | ORAL_TABLET | ORAL | Status: AC
  Administered 2015-07-05: 1 via ORAL
  Filled 2015-07-05: qty 1

## 2015-07-05 MED ORDER — PREDNISONE 20 MG PO TABS
60.0000 mg | ORAL_TABLET | Freq: Once | ORAL | Status: AC
  Administered 2015-07-05: 60 mg via ORAL
  Filled 2015-07-05: qty 3

## 2015-07-05 NOTE — ED Notes (Signed)
Patient reports bilateral knee pain with no injury that has been going on "for a while", but worse last few days.

## 2015-07-05 NOTE — ED Notes (Signed)
Pt presents with c/o chronic knee pain. Pt states she's always had problems with her right knee, but the pain in the left knee is new. Pt also reports numbness in her toes and bottoms of both feet. Pt denies any recent injury or trauma.

## 2015-07-05 NOTE — ED Provider Notes (Signed)
CSN: [id number]     Arrival date & time 07/05/15  1822 History   First MD Initiated Contact with Patient 07/05/15 2045     Chief Complaint  Patient presents with  . Knee Pain     (Consider location/radiation/quality/duration/timing/severity/associated sxs/prior Treatment) HPI  49 year old female presents to emergency department for evaluation of bilateral knee pain. Pain is been present for 3-4 days. No trauma or injury. She is a patient of [facility] and is scheduled for total knee arthroplasty after upcoming heart surgery. She has tried Tylenol and Aleve with no relief. Pain is severe, 9 out of 10 along the lateral joint line of both knees. She denies any calf pain. Pain is described as tightness and a deep ache. No warmth erythema or fevers.  Past Medical History  Diagnosis Date  . Asthma   . CHF (congestive heart failure)   . Fibromyalgia   . Pulmonary HTN   . Coronary artery disease     Leaky heart valve   Past Surgical History  Procedure Laterality Date  . Tonsillectomy     No family history on file. History  Substance Use Topics  . Smoking status: Current Every Day Smoker  . Smokeless tobacco: Not on file  . Alcohol Use: Yes   OB History    No data available     Review of Systems  Constitutional: Positive for activity change (presents in wheelchair). Negative for fever, chills and fatigue.  HENT: Negative for congestion, sinus pressure and sore throat.   Eyes: Negative for visual disturbance.  Respiratory: Negative for cough, chest tightness and shortness of breath.   Cardiovascular: Negative for chest pain and leg swelling.  Gastrointestinal: Negative for nausea, vomiting, abdominal pain and diarrhea.  Genitourinary: Negative for dysuria.  Musculoskeletal: Positive for arthralgias and gait problem.  Skin: Negative for rash.  Neurological: Negative for weakness, numbness and headaches.  Hematological: Negative for adenopathy.  Psychiatric/Behavioral:  Negative for behavioral problems, confusion and agitation.      Allergies  Aspirin; Flexeril; Morphine and related; Codeine; and Tramadol  Home Medications   Prior to Admission medications   Medication Sig Start Date End Date Taking? Authorizing Provider  HYDROcodone-acetaminophen (NORCO) 5-325 MG per tablet Take 1 tablet by mouth every 6 (six) hours as needed for moderate pain. MAXIMUM TOTAL ACETAMINOPHEN DOSE IS 4000 MG PER DAY 07/05/15   Duanne Guess, PA-C  oxyCODONE-acetaminophen (ROXICET) 5-325 MG per tablet Take 1 tablet by mouth every 6 (six) hours as needed. 06/08/15   Harvest Dark, MD  predniSONE (DELTASONE) 10 MG tablet Take 1 tablet (10 mg total) by mouth daily. 6,5,4,3,2,1 six day taper 07/05/15   Duanne Guess, PA-C   BP 129/89 mmHg  Pulse 88  Temp(Src) 98 F (36.7 C) (Oral)  Resp 18  Ht 5\' 5"  (1.651 m)  Wt 247 lb (112.038 kg)  BMI 41.10 kg/m2  SpO2 98% Physical Exam  Constitutional: She is oriented to person, place, and time. She appears well-developed and well-nourished. No distress (presents in a wheelchair.).  HENT:  Head: Normocephalic and atraumatic.  Mouth/Throat: Oropharynx is clear and moist.  Eyes: EOM are normal. Pupils are equal, round, and reactive to light. Right eye exhibits no discharge. Left eye exhibits no discharge.  Neck: Normal range of motion. Neck supple.  Cardiovascular: Normal rate, regular rhythm and intact distal pulses.   Pulmonary/Chest: Effort normal and breath sounds normal. No respiratory distress. She exhibits no tenderness.  Abdominal: Soft. She exhibits no distension. There is  no tenderness.  Musculoskeletal: Normal range of motion. She exhibits no edema.  Bilateral lower extremities: Examination of bilateral lower extremities  shows patient has no warmth erythema or effusion. Patient has full active extension of the knees. She has flexion to 105 bilaterally. She is tender on the lateral joint line of both knees. Negative Homans  sign bilaterally. Normal ankle plantarflexion and dorsiflexion bilaterally.  Neurological: She is alert and oriented to person, place, and time. She has normal reflexes.  Skin: Skin is warm and dry.  Psychiatric: She has a normal mood and affect. Her behavior is normal. Thought content normal.    ED Course  Procedures (including critical care time) Labs Review Labs Reviewed - No data to display  Imaging Review Dg Knee 2 Views Left  07/05/2015   CLINICAL DATA:  Left knee pain and swelling for 4 days. No known injury.  EXAM: LEFT KNEE - 1-2 VIEW  COMPARISON:  11/02/2014.  FINDINGS: No fracture or bone lesion. There is minor marginal spurring from the medial compartment. No other arthropathic change. No convincing joint effusion. Soft tissues are unremarkable.  IMPRESSION: 1. No fracture or acute finding. 2. Minor medial joint space compartment osteoarthritis.   Electronically Signed   By: Lajean Manes M.D.   On: 07/05/2015 21:18   Dg Knee 2 Views Right  07/05/2015   CLINICAL DATA:  Nontraumatic pain and swelling for 4 days.  EXAM: RIGHT KNEE - 1-2 VIEW  COMPARISON:  01/28/2015  FINDINGS: Moderate osteoarthritic changes are present involving all compartments without significant change from 01/28/2015. There is no bone lesion or bony destruction. No fracture or other acute abnormality is evident.  IMPRESSION: Osteoarthritis.   Electronically Signed   By: Andreas Newport M.D.   On: 07/05/2015 21:19     EKG Interpretation None      MDM   Final diagnoses:  Primary osteoarthritis of both knees    49 year old female with right greater than left knee pain. X-rays show moderate truck compartment osteoarthritis of the right knee and mild osteoarthritis of the left knee. No evidence of joint infection. No evidence of acute bony abnormality on x-rays. Patient will follow-up with orthopedic surgeon in [facility]. Given a prescription for prednisone to help with acute inflammation. Return to the ER  for any worsening symptoms or changes her health. She is given a walker to help with ambulation    Duanne Guess, PA-C 07/05/15 2150  Nena Polio, MD 07/06/15 [phone]

## 2015-07-05 NOTE — Discharge Instructions (Signed)
Arthritis, Nonspecific °Arthritis is inflammation of a joint. This usually means pain, redness, warmth or swelling are present. One or more joints may be involved. There are a number of types of arthritis. Your caregiver may not be able to tell what type of arthritis you have right away. °CAUSES  °The most common cause of arthritis is the wear and tear on the joint (osteoarthritis). This causes damage to the cartilage, which can break down over time. The knees, hips, back and neck are most often affected by this type of arthritis. °Other types of arthritis and common causes of joint pain include: °· Sprains and other injuries near the joint. Sometimes minor sprains and injuries cause pain and swelling that develop hours later. °· Rheumatoid arthritis. This affects hands, feet and knees. It usually affects both sides of your body at the same time. It is often associated with chronic ailments, fever, weight loss and general weakness. °· Crystal arthritis. Gout and pseudo gout can cause occasional acute severe pain, redness and swelling in the foot, ankle, or knee. °· Infectious arthritis. Bacteria can get into a joint through a break in overlying skin. This can cause infection of the joint. Bacteria and viruses can also spread through the blood and affect your joints. °· Drug, infectious and allergy reactions. Sometimes joints can become mildly painful and slightly swollen with these types of illnesses. °SYMPTOMS  °· Pain is the main symptom. °· Your joint or joints can also be red, swollen and warm or hot to the touch. °· You may have a fever with certain types of arthritis, or even feel overall ill. °· The joint with arthritis will hurt with movement. Stiffness is present with some types of arthritis. °DIAGNOSIS  °Your caregiver will suspect arthritis based on your description of your symptoms and on your exam. Testing may be needed to find the type of arthritis: °· Blood and sometimes urine tests. °· X-ray tests  and sometimes CT or MRI scans. °· Removal of fluid from the joint (arthrocentesis) is done to check for bacteria, crystals or other causes. Your caregiver (or a specialist) will numb the area over the joint with a local anesthetic, and use a needle to remove joint fluid for examination. This procedure is only minimally uncomfortable. °· Even with these tests, your caregiver may not be able to tell what kind of arthritis you have. Consultation with a specialist (rheumatologist) may be helpful. °TREATMENT  °Your caregiver will discuss with you treatment specific to your type of arthritis. If the specific type cannot be determined, then the following general recommendations may apply. °Treatment of severe joint pain includes: °· Rest. °· Elevation. °· Anti-inflammatory medication (for example, ibuprofen) may be prescribed. Avoiding activities that cause increased pain. °· Only take over-the-counter or prescription medicines for pain and discomfort as recommended by your caregiver. °· Cold packs over an inflamed joint may be used for 10 to 15 minutes every hour. Hot packs sometimes feel better, but do not use overnight. Do not use hot packs if you are diabetic without your caregiver's permission. °· A cortisone shot into arthritic joints may help reduce pain and swelling. °· Any acute arthritis that gets worse over the next 1 to 2 days needs to be looked at to be sure there is no joint infection. °Long-term arthritis treatment involves modifying activities and lifestyle to reduce joint stress jarring. This can include weight loss. Also, exercise is needed to nourish the joint cartilage and remove waste. This helps keep the muscles   around the joint strong. HOME CARE INSTRUCTIONS   Do not take aspirin to relieve pain if gout is suspected. This elevates uric acid levels.  Only take over-the-counter or prescription medicines for pain, discomfort or fever as directed by your caregiver.  Rest the joint as much as  possible.  If your joint is swollen, keep it elevated.  Use crutches if the painful joint is in your leg.  Drinking plenty of fluids may help for certain types of arthritis.  Follow your caregiver's dietary instructions.  Try low-impact exercise such as:  Swimming.  Water aerobics.  Biking.  Walking.  Morning stiffness is often relieved by a warm shower.  Put your joints through regular range-of-motion. SEEK MEDICAL CARE IF:   You do not feel better in 24 hours or are getting worse.  You have side effects to medications, or are not getting better with treatment. SEEK IMMEDIATE MEDICAL CARE IF:   You have a fever.  You develop severe joint pain, swelling or redness.  Many joints are involved and become painful and swollen.  There is severe back pain and/or leg weakness.  You have loss of bowel or bladder control. Document Released: 12/24/2004 Document Revised: 02/08/2012 Document Reviewed: 01/09/2009 [facility] Patient Information 2015 [place], [place]. This information is not intended to replace advice given to you by your health care provider. Make sure you discuss any questions you have with your health care provider.  Arthritis, Nonspecific Arthritis is inflammation of a joint. This usually means pain, redness, warmth or swelling are present. One or more joints may be involved. There are a number of types of arthritis. Your caregiver may not be able to tell what type of arthritis you have right away. CAUSES  The most common cause of arthritis is the wear and tear on the joint (osteoarthritis). This causes damage to the cartilage, which can break down over time. The knees, hips, back and neck are most often affected by this type of arthritis. Other types of arthritis and common causes of joint pain include:  Sprains and other injuries near the joint. Sometimes minor sprains and injuries cause pain and swelling that develop hours later.  Rheumatoid arthritis. This  affects hands, feet and knees. It usually affects both sides of your body at the same time. It is often associated with chronic ailments, fever, weight loss and general weakness.  Crystal arthritis. Gout and pseudo gout can cause occasional acute severe pain, redness and swelling in the foot, ankle, or knee.  Infectious arthritis. Bacteria can get into a joint through a break in overlying skin. This can cause infection of the joint. Bacteria and viruses can also spread through the blood and affect your joints.  Drug, infectious and allergy reactions. Sometimes joints can become mildly painful and slightly swollen with these types of illnesses. SYMPTOMS   Pain is the main symptom.  Your joint or joints can also be red, swollen and warm or hot to the touch.  You may have a fever with certain types of arthritis, or even feel overall ill.  The joint with arthritis will hurt with movement. Stiffness is present with some types of arthritis. DIAGNOSIS  Your caregiver will suspect arthritis based on your description of your symptoms and on your exam. Testing may be needed to find the type of arthritis:  Blood and sometimes urine tests.  X-ray tests and sometimes CT or MRI scans.  Removal of fluid from the joint (arthrocentesis) is done to check for bacteria, crystals or  other causes. Your caregiver (or a specialist) will numb the area over the joint with a local anesthetic, and use a needle to remove joint fluid for examination. This procedure is only minimally uncomfortable.  Even with these tests, your caregiver may not be able to tell what kind of arthritis you have. Consultation with a specialist (rheumatologist) may be helpful. TREATMENT  Your caregiver will discuss with you treatment specific to your type of arthritis. If the specific type cannot be determined, then the following general recommendations may apply. Treatment of severe joint pain  includes:  Rest.  Elevation.  Anti-inflammatory medication (for example, ibuprofen) may be prescribed. Avoiding activities that cause increased pain.  Only take over-the-counter or prescription medicines for pain and discomfort as recommended by your caregiver.  Cold packs over an inflamed joint may be used for 10 to 15 minutes every hour. Hot packs sometimes feel better, but do not use overnight. Do not use hot packs if you are diabetic without your caregiver's permission.  A cortisone shot into arthritic joints may help reduce pain and swelling.  Any acute arthritis that gets worse over the next 1 to 2 days needs to be looked at to be sure there is no joint infection. Long-term arthritis treatment involves modifying activities and lifestyle to reduce joint stress jarring. This can include weight loss. Also, exercise is needed to nourish the joint cartilage and remove waste. This helps keep the muscles around the joint strong. HOME CARE INSTRUCTIONS   Do not take aspirin to relieve pain if gout is suspected. This elevates uric acid levels.  Only take over-the-counter or prescription medicines for pain, discomfort or fever as directed by your caregiver.  Rest the joint as much as possible.  If your joint is swollen, keep it elevated.  Use crutches if the painful joint is in your leg.  Drinking plenty of fluids may help for certain types of arthritis.  Follow your caregiver's dietary instructions.  Try low-impact exercise such as:  Swimming.  Water aerobics.  Biking.  Walking.  Morning stiffness is often relieved by a warm shower.  Put your joints through regular range-of-motion. SEEK MEDICAL CARE IF:   You do not feel better in 24 hours or are getting worse.  You have side effects to medications, or are not getting better with treatment. SEEK IMMEDIATE MEDICAL CARE IF:   You have a fever.  You develop severe joint pain, swelling or redness.  Many joints are  involved and become painful and swollen.  There is severe back pain and/or leg weakness.  You have loss of bowel or bladder control. Document Released: 12/24/2004 Document Revised: 02/08/2012 Document Reviewed: 01/09/2009 [facility] Patient Information 2015 [place], [place]. This information is not intended to replace advice given to you by your health care provider. Make sure you discuss any questions you have with your health care provider.  Arthritis, Nonspecific Arthritis is pain, redness, warmth, or puffiness (inflammation) of a joint. The joint may be stiff or hurt when you move it. One or more joints may be affected. There are many types of arthritis. Your doctor may not know what type you have right away. The most common cause of arthritis is wear and tear on the joint (osteoarthritis). HOME CARE   Only take medicine as told by your doctor.  Rest the joint as much as possible.  Raise (elevate) your joint if it is puffy.  Use crutches if the painful joint is in your leg.  Drink enough fluids to  keep your pee (urine) clear or pale yellow.  Follow your doctor's diet instructions.  Use cold packs for very bad joint pain for 10 to 15 minutes every hour. Ask your doctor if it is okay for you to use hot packs.  Exercise as told by your doctor.  Take a warm shower if you have stiffness in the morning.  Move your sore joints throughout the day. GET HELP RIGHT AWAY IF:   You have a fever.  You have very bad joint pain, puffiness, or redness.  You have many joints that are painful and puffy.  You are not getting better with treatment.  You have very bad back pain or leg weakness.  You cannot control when you poop (bowel movement) or pee (urinate).  You do not feel better in 24 hours or are getting worse.  You are having side effects from your medicine. MAKE SURE YOU:   Understand these instructions.  Will watch your condition.  Will get help right away if you are not  doing well or get worse. Document Released: 02/10/2010 Document Revised: 05/17/2012 Document Reviewed: 02/10/2010 [facility] Patient Information 2015 [place], [place]. This information is not intended to replace advice given to you by your health care provider. Make sure you discuss any questions you have with your health care provider.

## 2015-12-10 ENCOUNTER — Emergency Department: Payer: Medicaid Other

## 2015-12-10 ENCOUNTER — Encounter: Payer: Self-pay | Admitting: Emergency Medicine

## 2015-12-10 ENCOUNTER — Emergency Department
Admission: EM | Admit: 2015-12-10 | Discharge: 2015-12-10 | Disposition: A | Discharge status: Home / Self Care | Payer: Medicaid Other | Attending: Emergency Medicine | Admitting: Emergency Medicine

## 2015-12-10 DIAGNOSIS — M25561 Pain in right knee: Secondary | ICD-10-CM | POA: Insufficient documentation

## 2015-12-10 DIAGNOSIS — R05 Cough: Secondary | ICD-10-CM

## 2015-12-10 DIAGNOSIS — J449 Chronic obstructive pulmonary disease, unspecified: Secondary | ICD-10-CM | POA: Insufficient documentation

## 2015-12-10 DIAGNOSIS — R059 Cough, unspecified: Secondary | ICD-10-CM

## 2015-12-10 DIAGNOSIS — G8929 Other chronic pain: Secondary | ICD-10-CM

## 2015-12-10 DIAGNOSIS — J411 Mucopurulent chronic bronchitis: Secondary | ICD-10-CM

## 2015-12-10 DIAGNOSIS — M25562 Pain in left knee: Secondary | ICD-10-CM | POA: Insufficient documentation

## 2015-12-10 DIAGNOSIS — F17210 Nicotine dependence, cigarettes, uncomplicated: Secondary | ICD-10-CM | POA: Diagnosis not present

## 2015-12-10 MED ORDER — PREDNISONE 10 MG PO TABS: ORAL_TABLET | ORAL | Status: DC

## 2015-12-10 MED ORDER — BENZONATATE 100 MG PO CAPS: 100.0000 mg | ORAL_CAPSULE | Freq: Three times a day (TID) | ORAL | Status: AC | PRN

## 2015-12-10 NOTE — ED Notes (Addendum)
Patient c/o pain in both knees - states she has OA, and needs surgery but has a "leaky valve" in her heart. Cough, yellowish green phlegm - small amount.  Dry mouth.  Chest, sides, and stomach hurt when she coughs. Taking Nyquil without relief. Uses albuterol nebulizer - did not use today.  History of pulmonary hypertension, COPD, bronchitis, asthma, rheumatic heart disease, hypertension. Patient is alert and oriented.   Has a tight sounding cough, splints chest when she coughs. No redness or obvious swelling of knees. Lungs clear to auscultation bilaterally.  Voice sounds congested, nose running. Also complaining of dizziness with nausea.

## 2015-12-10 NOTE — Discharge Instructions (Signed)
Chronic Bronchitis Chronic bronchitis is a lasting inflammation of the bronchial tubes, which are the tubes that carry air into your lungs. This is inflammation that occurs:   On most days of the week.   For at least three months at a time.   Over a period of two years in a row. When the bronchial tubes are inflamed, they start to produce mucus. The inflammation and buildup of mucus make it more difficult to breathe. Chronic bronchitis is usually a permanent problem and is one type of chronic obstructive pulmonary disease (COPD). People with chronic bronchitis are at greater risk for getting repeated colds, or respiratory infections. CAUSES  Chronic bronchitis most often occurs in people who have:  Long-standing, severe asthma.  A history of smoking.  Asthma and who also smoke. SIGNS AND SYMPTOMS  Chronic bronchitis may cause the following:   A cough that brings up mucus (productive cough).  Shortness of breath.  Early morning headache.  Wheezing.  Chest discomfort.   Recurring respiratory infections. DIAGNOSIS  Your health care provider may confirm the diagnosis by:  Taking your medical history.  Performing a physical exam.  Taking a chest X-ray.   Performing pulmonary function tests. TREATMENT  Treatment involves controlling symptoms with medicines, oxygen therapy, or making lifestyle changes, such as exercising and eating a healthy, well-balanced diet. Medicines could include:  Inhalers to improve air flow in and out of your lungs.  Antibiotics to treat bacterial infections, such as pneumonia, sinus infections, and acute bronchitis. As a preventative measure, your health care provider may recommend routine vaccinations for influenza and pneumonia. This is to prevent infection and hospitalization since you may be more at risk for these types of infections.  HOME CARE INSTRUCTIONS  Take medicines only as directed by your health care provider.   If you smoke  cigarettes, chew tobacco, or use electronic cigarettes, quit. If you need help quitting, ask your health care provider.  Avoid pollen, dust, animal dander, molds, smoke, and other things that cause shortness of breath or wheezing attacks.  Talk to your health care provider about possible exercise routines. Regular exercise is very important to help you feel better.  If you are prescribed oxygen use at home follow these guidelines:  Never smoke while using oxygen. Oxygen does not burn or explode, but flammable materials will burn faster in the presence of oxygen.  Keep a fire extinguisher close by. Let your fire department know that you have oxygen in your home.  Warn visitors not to smoke near you when you are using oxygen. Put up "no smoking" signs in your home where you most often use the oxygen.  Regularly test your smoke detectors at home to make sure they work. If you receive care in your home from a nurse or other health care provider, he or she may also check to make sure your smoke detectors work.  Ask your health care provider whether you would benefit from a pulmonary rehabilitation program.  Do not wait to get medical care if you have any concerning symptoms. Delays could cause permanent injury and may be life threatening. SEEK MEDICAL CARE IF:  You have increased coughing or shortness of breath or both.  You have muscle aches.  You have chest pain.  Your mucus gets thicker.  Your mucus changes from clear or white to yellow, green, gray, or bloody. SEEK IMMEDIATE MEDICAL CARE IF:  Your usual medicines do not stop your wheezing.   You have increased difficulty breathing.     You have any problems with the medicine you are taking, such as a rash, itching, swelling, or trouble breathing. MAKE SURE YOU:   Understand these instructions.  Will watch your condition.  Will get help right away if you are not doing well or get worse.   This information is not intended to  replace advice given to you by your health care provider. Make sure you discuss any questions you have with your health care provider.   Document Released: 09/03/2006 Document Revised: 12/07/2014 Document Reviewed: 12/25/2013 Elsevier Interactive Patient Education 2016 [employer] Inc[employer]  

## 2015-12-10 NOTE — ED Notes (Signed)
States she developed for couple of days.. Cough is non prod. Unsure of fever but has had chills. Also having some discomfort in chest with cough and inspiration

## 2015-12-10 NOTE — ED Provider Notes (Signed)
CSN: [id number]     Arrival date & time 12/10/15  0913 History   First MD Initiated Contact with Patient 12/10/15 [phone]     Chief Complaint  Patient presents with  . URI     HPI Comments: 50 year old female with a history of pulmonary hypertension, chronic bronchitis, asthma, rheumatic heart disease and COPD presents today complaining of cough and congestion for the past 3 days. Repots she has chest wall pain and lower lateral rib pain secondary to the coughing. Her PCP is at [facility] and she was unable to get in touch with them to get an appointment. No shortness of breath. Taking over the counter medicines without relief of pain.   Pt also reports that she has bad arthritis in her knees and needs pain medication for them. Followed by Dr. Thurmond Butts, orthopedist in [place] and needs surgery on both knees but can't have it right now for medical reasons.    The history is provided by the patient.    Past Medical History  Diagnosis Date  . Asthma   . CHF (congestive heart failure) ([place])   . Fibromyalgia   . Pulmonary HTN ([place])   . Coronary artery disease     Leaky heart valve   Past Surgical History  Procedure Laterality Date  . Tonsillectomy     No family history on file. Social History  Substance Use Topics  . Smoking status: Current Every Day Smoker    Types: Cigarettes  . Smokeless tobacco: None  . Alcohol Use: 1.8 oz/week    3 Cans of beer per week     Comment: 16 oz per week   OB History    Gravida Para Term Preterm AB TAB SAB Ectopic Multiple Living   1 1             Review of Systems  Constitutional: Negative for fever and chills.  Respiratory: Positive for cough. Negative for shortness of breath and wheezing.   Cardiovascular: Negative for chest pain, palpitations and leg swelling.  Musculoskeletal: Positive for myalgias, joint swelling and arthralgias.  Skin: Negative for rash.  All other systems reviewed and are negative.     Allergies  Aspirin;  Flexeril; Morphine and related; Codeine; and Tramadol  Home Medications   Prior to Admission medications   Medication Sig Start Date End Date Taking? Authorizing Provider  benzonatate (TESSALON PERLES) 100 MG capsule Take 1 capsule (100 mg total) by mouth 3 (three) times daily as needed for cough. 12/10/15 12/09/16  Harvest Dark, PA-C  predniSONE (DELTASONE) 10 MG tablet Taper: 6,5,4,3,2,1 12/10/15   Angelica Ran V, PA-C   BP 134/94 mmHg  Pulse 102  Temp(Src) 98.1 F (36.7 C) (Oral)  Resp 24  Ht 5\' 5"  (1.651 m)  Wt 118.842 kg  BMI 43.60 kg/m2  SpO2 99%  LMP 11/08/2009 Physical Exam  Constitutional: She is oriented to person, place, and time. Vital signs are normal. She appears well-developed and well-nourished. She is active.  Non-toxic appearance. She does not have a sickly appearance. She does not appear ill.  HENT:  Head: Normocephalic and atraumatic.  Right Ear: Tympanic membrane and external ear normal.  Left Ear: Tympanic membrane and external ear normal.  Nose: Nose normal.  Mouth/Throat: Uvula is midline, oropharynx is clear and moist and mucous membranes are normal.  Eyes: Conjunctivae and EOM are normal. Pupils are equal, round, and reactive to light.  Neck: Normal range of motion. Neck supple.  Cardiovascular: Normal  rate, regular rhythm, normal heart sounds and intact distal pulses.  Exam reveals no gallop and no friction rub.   No murmur heard. Pulmonary/Chest: Effort normal and breath sounds normal. No respiratory distress. She has no wheezes. She has no rales.  Musculoskeletal: Normal range of motion.  Lymphadenopathy:    She has no cervical adenopathy.  Neurological: She is alert and oriented to person, place, and time.  Skin: Skin is warm and dry.  Psychiatric: She has a normal mood and affect. Her behavior is normal. Judgment and thought content normal.  Nursing note and vitals reviewed.   ED Course  Procedures (including critical care time) Labs  Review Labs Reviewed - No data to display  Imaging Review Dg Chest 2 View  12/10/2015  CLINICAL DATA:  Productive cough, pleuritic chest discomfort over the past 3 days, history of COPD, pulmonary hypertension, CHF, and asthma ; current smoker. EXAM: CHEST  2 VIEW COMPARISON:  PA and lateral chest x-ray of November 05, 2014 FINDINGS: The lungs are adequately inflated. The interstitial markings are minimally prominent but stable. The heart is top-normal in size. The pulmonary vascularity is normal. The mediastinum is normal in width. There is no pleural effusion. The bony thorax exhibits no acute abnormality. IMPRESSION: Chronic bronchitic changes decreased cardiomegaly. There is no alveolar pneumonia nor pulmonary edema. Electronically Signed   By: David  Martinique M.D.   On: 12/10/2015 10:02   I have personally reviewed and evaluated these images and lab results as part of my medical decision-making.   EKG Interpretation None      MDM  Discussed results with patient. Findings consistent with chronic bronchitis. Given legnth of symptoms <48 hours no indication for antibiotics. Likely exacerbation of underlying lung disease. Continue using nebulizers/inhalers at home every 4-6 hours. Prednisone 6 day taper - no nsaids while taking. Tessalon perles for cough  Advised to follow up with her orthopedist for chronic knee pain as we do not treat chronic knee pain.  Final diagnoses:  Cough  Bilateral chronic knee pain  Mucopurulent chronic bronchitis ([place])        Harvest Dark, PA-C 12/10/15 1020  Harvest Dark, MD 12/10/15 1530

## 2016-02-17 ENCOUNTER — Other Ambulatory Visit: Payer: Self-pay | Admitting: Orthopedic Surgery

## 2016-02-17 DIAGNOSIS — M5416 Radiculopathy, lumbar region: Secondary | ICD-10-CM

## 2016-02-25 ENCOUNTER — Other Ambulatory Visit: Payer: Medicaid Other

## 2016-03-02 ENCOUNTER — Ambulatory Visit
Admission: RE | Admit: 2016-03-02 | Discharge: 2016-03-02 | Disposition: A | Discharge status: Home / Self Care | Payer: Medicaid Other | Source: Ambulatory Visit | Attending: Orthopedic Surgery | Admitting: Orthopedic Surgery

## 2016-03-02 DIAGNOSIS — M5416 Radiculopathy, lumbar region: Secondary | ICD-10-CM

## 2016-03-06 ENCOUNTER — Telehealth: Payer: Self-pay

## 2016-03-06 NOTE — Telephone Encounter (Signed)
Received fax from [facility] to refill Omeprazole 20 mg, she is no longer [facility] pt, sent fax to [facility] making them aware of this

## 2016-08-18 ENCOUNTER — Ambulatory Visit: Payer: Self-pay | Admitting: Physician Assistant

## 2016-08-18 NOTE — H&P (Signed)
TOTAL KNEE ADMISSION H&P  Patient is being admitted for right total knee arthroplasty.  Subjective:  Chief Complaint:right knee pain.  HPI: Chelsea Ramsey, 50 y.o. female, has a history of pain and functional disability in the right knee due to arthritis and has failed non-surgical conservative treatments for greater than 12 weeks to includeNSAID's and/or analgesics, corticosteriod injections, viscosupplementation injections and activity modification.  Onset of symptoms was gradual, starting >10 years ago with gradually worsening course since that time. The patient noted no past surgery on the right knee(s).  Patient currently rates pain in the right knee(s) at 10 out of 10 with activity. Patient has night pain, worsening of pain with activity and weight bearing, pain that interferes with activities of daily living, pain with passive range of motion, crepitus and joint swelling.  Patient has evidence of periarticular osteophytes and joint space narrowing by imaging studies. There is no active infection.  There are no active problems to display for this patient.  Past Medical History:  Diagnosis Date  . Allergy    seasonal  . Arthritis    Right Knee  . Asthma   . CHF (congestive heart failure) ([place])   . COPD (chronic obstructive pulmonary disease) ([place])   . Coronary artery disease    Leaky heart valve  . Fibromyalgia   . GERD (gastroesophageal reflux disease)   . Hypertension   . PUD (peptic ulcer disease)   . Pulmonary HTN ([place])     Past Surgical History:  Procedure Laterality Date  . ADENOIDECTOMY    . TONSILLECTOMY       (Not in a hospital admission) Allergies  Allergen Reactions  . Aspirin Swelling  . Flexeril [Cyclobenzaprine] Swelling  . Morphine And Related Swelling  . Codeine Rash  . Tramadol Rash    Social History  Substance Use Topics  . Smoking status: Current Every Day Smoker    Types: Cigarettes  . Smokeless tobacco: Not on file  . Alcohol use 1.8  oz/week    3 Cans of beer per week     Comment: 16 oz per week    Family History  Problem Relation Age of Onset  . COPD Mother   . Cancer Mother     Bone  . Asthma Mother   . Congestive Heart Failure Father      Review of Systems  Respiratory: Positive for shortness of breath.   Cardiovascular: Positive for chest pain.  Psychiatric/Behavioral: The patient is nervous/anxious.   All other systems reviewed and are negative.   Objective:  Physical Exam  Constitutional: She is oriented to person, place, and time. She appears well-developed and well-nourished. No distress.  HENT:  Head: Normocephalic and atraumatic.  Nose: Nose normal.  Eyes: Conjunctivae and EOM are normal. Pupils are equal, round, and reactive to light.  Neck: Normal range of motion. Neck supple.  Cardiovascular: Normal rate, regular rhythm, normal heart sounds and intact distal pulses.   Respiratory: Effort normal and breath sounds normal. No respiratory distress. She has no wheezes.  GI: Soft. Bowel sounds are normal. She exhibits no distension. There is no tenderness.  Musculoskeletal:       Right knee: She exhibits decreased range of motion and swelling. She exhibits no effusion and no erythema. Tenderness found.  Lymphadenopathy:    She has no cervical adenopathy.  Neurological: She is alert and oriented to person, place, and time. No cranial nerve deficit.  Skin: Skin is warm and dry. No rash noted. No erythema.  Psychiatric: She has a normal mood and affect. Her behavior is normal.    Vital signs in last 24 hours: @VSRANGES @  Labs:   Estimated body mass index is 43.6 kg/m as calculated from the following:   Height as of 12/10/15: 5\' 5"  (1.651 m).   Weight as of 12/10/15: 118.8 kg (262 lb).   Imaging Review Plain radiographs demonstrate moderate degenerative joint disease of the right knee(s). The overall alignment ismild valgus. The bone quality appears to be good for age and reported activity  level.  Assessment/Plan:  End stage arthritis, right knee   The patient history, physical examination, clinical judgment of the provider and imaging studies are consistent with end stage degenerative joint disease of the right knee(s) and total knee arthroplasty is deemed medically necessary. The treatment options including medical management, injection therapy arthroscopy and arthroplasty were discussed at length. The risks and benefits of total knee arthroplasty were presented and reviewed. The risks due to aseptic loosening, infection, stiffness, patella tracking problems, thromboembolic complications and other imponderables were discussed. The patient acknowledged the explanation, agreed to proceed with the plan and consent was signed. Patient is being admitted for inpatient treatment for surgery, pain control, PT, OT, prophylactic antibiotics, VTE prophylaxis, progressive ambulation and ADL's and discharge planning. The patient is planning to be discharged home with home health services

## 2016-08-24 ENCOUNTER — Encounter (HOSPITAL_COMMUNITY)
Admission: RE | Admit: 2016-08-24 | Discharge: 2016-08-24 | Disposition: A | Discharge status: Home / Self Care | Payer: Medicaid Other | Source: Ambulatory Visit | Attending: Orthopedic Surgery | Admitting: Orthopedic Surgery

## 2016-08-24 ENCOUNTER — Encounter (HOSPITAL_COMMUNITY): Payer: Self-pay

## 2016-08-24 DIAGNOSIS — Z01812 Encounter for preprocedural laboratory examination: Secondary | ICD-10-CM | POA: Diagnosis not present

## 2016-08-24 DIAGNOSIS — I509 Heart failure, unspecified: Secondary | ICD-10-CM | POA: Insufficient documentation

## 2016-08-24 DIAGNOSIS — M25561 Pain in right knee: Secondary | ICD-10-CM | POA: Insufficient documentation

## 2016-08-24 DIAGNOSIS — M797 Fibromyalgia: Secondary | ICD-10-CM | POA: Diagnosis not present

## 2016-08-24 DIAGNOSIS — J45909 Unspecified asthma, uncomplicated: Secondary | ICD-10-CM | POA: Insufficient documentation

## 2016-08-24 DIAGNOSIS — I2510 Atherosclerotic heart disease of native coronary artery without angina pectoris: Secondary | ICD-10-CM | POA: Diagnosis not present

## 2016-08-24 DIAGNOSIS — I110 Hypertensive heart disease with heart failure: Secondary | ICD-10-CM | POA: Diagnosis not present

## 2016-08-24 DIAGNOSIS — J449 Chronic obstructive pulmonary disease, unspecified: Secondary | ICD-10-CM | POA: Diagnosis not present

## 2016-08-24 DIAGNOSIS — I272 Other secondary pulmonary hypertension: Secondary | ICD-10-CM | POA: Insufficient documentation

## 2016-08-24 HISTORY — DX: Rheumatic heart disease, unspecified: I09.9

## 2016-08-24 HISTORY — DX: Sleep apnea, unspecified: G47.30

## 2016-08-24 HISTORY — DX: Pneumonia, unspecified organism: J18.9

## 2016-08-24 HISTORY — DX: Anxiety disorder, unspecified: F41.9

## 2016-08-24 LAB — URINALYSIS, ROUTINE W REFLEX MICROSCOPIC
Bilirubin Urine: NEGATIVE
Glucose, UA: NEGATIVE mg/dL
Hgb urine dipstick: NEGATIVE
Ketones, ur: NEGATIVE mg/dL
NITRITE: NEGATIVE
PH: 5.5 (ref 5.0–8.0)
Protein, ur: NEGATIVE mg/dL
SPECIFIC GRAVITY, URINE: 1.025 (ref 1.005–1.030)

## 2016-08-24 LAB — CBC WITH DIFFERENTIAL/PLATELET
BASOS ABS: 0 10*3/uL (ref 0.0–0.1)
Basophils Relative: 0 %
Eosinophils Absolute: 0.2 10*3/uL (ref 0.0–0.7)
Eosinophils Relative: 2 %
HEMATOCRIT: 40.8 % (ref 36.0–46.0)
Hemoglobin: 12.8 g/dL (ref 12.0–15.0)
LYMPHS ABS: 1.8 10*3/uL (ref 0.7–4.0)
LYMPHS PCT: 19 %
MCH: 27.9 pg (ref 26.0–34.0)
MCHC: 31.4 g/dL (ref 30.0–36.0)
MCV: 89.1 fL (ref 78.0–100.0)
MONO ABS: 0.7 10*3/uL (ref 0.1–1.0)
Monocytes Relative: 7 %
Neutro Abs: 6.9 10*3/uL (ref 1.7–7.7)
Neutrophils Relative %: 72 %
Platelets: 283 10*3/uL (ref 150–400)
RBC: 4.58 MIL/uL (ref 3.87–5.11)
RDW: 13.4 % (ref 11.5–15.5)
WBC: 9.6 10*3/uL (ref 4.0–10.5)

## 2016-08-24 LAB — COMPREHENSIVE METABOLIC PANEL
ALT: 26 U/L (ref 14–54)
AST: 33 U/L (ref 15–41)
Albumin: 4.2 g/dL (ref 3.5–5.0)
Alkaline Phosphatase: 104 U/L (ref 38–126)
Anion gap: 11 (ref 5–15)
BILIRUBIN TOTAL: 0.6 mg/dL (ref 0.3–1.2)
BUN: 11 mg/dL (ref 6–20)
CO2: 25 mmol/L (ref 22–32)
CREATININE: 0.67 mg/dL (ref 0.44–1.00)
Calcium: 9.6 mg/dL (ref 8.9–10.3)
Chloride: 104 mmol/L (ref 101–111)
GFR calc Af Amer: >60 mL/min (ref >60)
Glucose, Bld: 103 mg/dL — ABNORMAL HIGH (ref 65–99)
POTASSIUM: 3.8 mmol/L (ref 3.5–5.1)
Sodium: 140 mmol/L (ref 135–145)
TOTAL PROTEIN: 8.2 g/dL — AB (ref 6.5–8.1)

## 2016-08-24 LAB — TYPE AND SCREEN
ABO/RH(D): B POS
Antibody Screen: NEGATIVE

## 2016-08-24 LAB — URINE MICROSCOPIC-ADD ON

## 2016-08-24 LAB — PROTIME-INR
INR: 1.05
PROTHROMBIN TIME: 13.7 s (ref 11.4–15.2)

## 2016-08-24 LAB — APTT: aPTT: 28 seconds (ref 24–36)

## 2016-08-24 LAB — SURGICAL PCR SCREEN
MRSA, PCR: NEGATIVE
Staphylococcus aureus: NEGATIVE

## 2016-08-24 LAB — ABO/RH: ABO/RH(D): B POS

## 2016-08-24 NOTE — Progress Notes (Signed)
Pt denies any acute cardiopulmonary issues. Pt is under the card of Dr. Marcelline Deist and Dr. Earnstine Regal, Cardiology. Requested EKG tracing from [facility], Dr. Marcelline Deist. Pt has a history of Sleep Apnea and does not wear a CPAP, pt wears 2 liters of oxygen at night. Pt stated that she last used Cocaine and Marijuana July 31, 2016. Pt stated " I will not use any more drugs, I want to have my surgery, "  when asked to abstain and educated on the consequences of drug use . Requested EKG tracing from [facility], Dr. Marcelline Deist. Pt chart forwarded to anesthesia to review clearance note.

## 2016-08-24 NOTE — Pre-Procedure Instructions (Signed)
    AMYRA DELAROSA  08/24/2016      [place], [place] - [address] [place] [address] Phone: [phone] Fax: [phone]    Your procedure is scheduled on Friday, September 04, 2016.  Report to [facility] Admitting at 8:45 A.M.  Call this number if you have problems the morning of surgery:  [phone]   Remember:  Do not eat food or drink liquids after midnight Thursday, September 03, 2016  Take these medicines the morning of surgery with A SIP OF WATER :carvedilol (COREG), gabapentin (NEURONTIN), omeprazole (PRILOSEC), PARoxetine (PAXIL), acetaminophen (TYLENOL), budesonide-formoterol (SYMBICORT)  Inhaler,  if needded:DUONEB), ARTIFICIAL TEARS for dry eyes,  albuterol (PROVENTIL HFA;VENTOLIN HFA)  Inhaler for shortness of breath ( bring inhaler in with you on day of surgery). Stop taking Aspirin ( allergy), vitamins, fish oil and herbal medications. Do not take any NSAIDs ie: Ibuprofen, Advil, Naproxen, BC and Goody Powder or any medication containing Aspirin; stop Friday, August 28, 2016.  Do not wear jewelry, make-up or nail polish.  Do not wear lotions, powders, or perfumes, or deoderant.  Do not shave 48 hours prior to surgery.   Do not bring valuables to the hospital.  [facility] is not responsible for any belongings or valuables.  Contacts, dentures or bridgework may not be worn into surgery.  Leave your suitcase in the car.  After surgery it may be brought to your room.  For patients admitted to the hospital, discharge time will be determined by your treatment team.  Patients discharged the day of surgery will not be allowed to drive home.   Name and phone number of your driver:  Special instructions: Shower the night before surgery and the morning of surgery with CHG.  Please read over the following fact sheets that you were given. Pain Booklet, Coughing and Deep Breathing, Blood Transfusion Information,  Total Joint Packet, MRSA Information and Surgical Site Infection Prevention

## 2016-08-25 ENCOUNTER — Other Ambulatory Visit (HOSPITAL_COMMUNITY): Payer: Medicaid Other

## 2016-08-25 LAB — URINE CULTURE: Culture: <10000 — AB

## 2016-08-25 NOTE — Progress Notes (Addendum)
Anesthesia Chart Review:  Pt is a 50 year old female scheduled for R total knee arthroplasty on 09/04/2016 with Earlie Server, MD.   - Pulmonologist is Romona Curls, MD, who follows pt for pulmonary HTN, asthma and OSA. - Cardiologist is Jill Side, MD, who follows pt for rheumatic heart disease; Dr. Earnstine Regal cleared pt for surgery at intermediate risk.  - PCP is Muddasir Derinda Sis, MD who has cleared pt for surgery noting pt is optimized at this time.  - There are notes in care everywhere for above providers and on paper chart.   PMH includes:  CAD, CHF, pulmonary HTN, rheumatic heart disease, HTN, OSA, COPD, asthma, GERD.  Current smoker. Marijuana and cocaine use last 9/1/7. BMI 44  Medications include: albuterol, symbicort, carvedilol, lasix, duoneb, losartan, prilosec  Preoperative labs reviewed.    CXR 12/10/15: Chronic bronchitic changes decreased cardiomegaly. There is no alveolar pneumonia nor pulmonary edema.  EKG will be obtained DOS.   Polysomnography 08/18/16 in care everywhere  Cardiac event monitor 04/20/16 (care everywhere):  - The predominant rhythm was sinus. - The average heart rate was 120 BPM. - There was no atrial fibrillation.  - There was rare supraventricular beats  - There was rare ventricular ectopic beats  Stress echo 11/05/15 (care everywhere):  Echocardiogram stress treadmill aborted due to patient's inability to tolerate test  Limtied echocardiogram to evaluation mitral regurgitation and tricuspid regurgitation  Normal left ventricular systolic function  Degenerative mitral valve disease  Mitral annular calcification  Mitral regurgitation - moderate -Doppler study is limited and MR may be underestimated  Dilated left atrium - moderate to severe  Elevated pulmonary artery systolic pressure - moderate  Low normal right ventricular systolic function  Tricuspid regurgitation - mild to moderate  Dilated right atrium - mild  Cardiac cath  05/27/15 (care everywhere): - 4.6 mm gradient LV / Wedge - Markedly improved PA pressures and PVR from prior cath PA mean 65->31. PVR 8.4->3.3 - No significant coronary artery disease  Echo 05/07/15 (care everywhere):  - Probablerheumatic heartdisease -Normalleft ventricularejection [id number]) - Diastolicleft ventriculardysfunction - Elevatedleft ventricularfilling pressures - Degenerativemitral valvedisease - Mitralregurgitation (mildto moderate) -Mitralstenosis(mild) - Dilatedleft atrium - Tricuspidregurgitation(moderate) -Pulmonaryhypertension(severe) - Dilatedright ventricle - Normalright ventricularcontractile performance -Elevatedcentral venousand right atrialpressures (seedetail below) -Dilatedright atrium  If no changes, I anticipate pt can proceed with surgery as scheduled.   Willeen Cass, FNP-BC [facility]/Anesthesiology Phone: [phone] 08/26/2016 3:56 PM

## 2016-08-25 NOTE — Progress Notes (Signed)
Spoke with Claiborne Billings, Surgical Assistant, to make MD aware of pt abnormal UA and that pt stated that she last used Cocaine and Marijuana on September 1st and pt was advised to abstain recreational drug use.

## 2016-09-03 ENCOUNTER — Ambulatory Visit: Payer: Self-pay | Admitting: Physician Assistant

## 2016-09-03 MED ORDER — TRANEXAMIC ACID 1000 MG/10ML IV SOLN
1000.0000 mg | INTRAVENOUS | Status: AC
  Administered 2016-09-04: 1000 mg via INTRAVENOUS
  Filled 2016-09-03: qty 10

## 2016-09-04 ENCOUNTER — Encounter (HOSPITAL_COMMUNITY)
Admission: RE | Disposition: A | Discharge status: Home Health | Payer: Self-pay | Source: Ambulatory Visit | Attending: Orthopedic Surgery

## 2016-09-04 ENCOUNTER — Inpatient Hospital Stay (HOSPITAL_COMMUNITY)
Admission: RE | Admit: 2016-09-04 | Discharge: 2016-09-05 | DRG: 470 | Disposition: A | Discharge status: Home Health | Payer: Medicaid Other | Source: Ambulatory Visit | Attending: Orthopedic Surgery | Admitting: Orthopedic Surgery

## 2016-09-04 ENCOUNTER — Inpatient Hospital Stay (HOSPITAL_COMMUNITY): Payer: Medicaid Other | Admitting: Anesthesiology

## 2016-09-04 ENCOUNTER — Encounter (HOSPITAL_COMMUNITY): Payer: Self-pay | Admitting: Anesthesiology

## 2016-09-04 ENCOUNTER — Inpatient Hospital Stay (HOSPITAL_COMMUNITY): Payer: Medicaid Other | Admitting: Emergency Medicine

## 2016-09-04 DIAGNOSIS — M25561 Pain in right knee: Secondary | ICD-10-CM | POA: Diagnosis present

## 2016-09-04 DIAGNOSIS — I509 Heart failure, unspecified: Secondary | ICD-10-CM | POA: Diagnosis present

## 2016-09-04 DIAGNOSIS — F17210 Nicotine dependence, cigarettes, uncomplicated: Secondary | ICD-10-CM | POA: Diagnosis present

## 2016-09-04 DIAGNOSIS — J45909 Unspecified asthma, uncomplicated: Secondary | ICD-10-CM | POA: Diagnosis present

## 2016-09-04 DIAGNOSIS — I110 Hypertensive heart disease with heart failure: Secondary | ICD-10-CM | POA: Diagnosis present

## 2016-09-04 DIAGNOSIS — K219 Gastro-esophageal reflux disease without esophagitis: Secondary | ICD-10-CM | POA: Diagnosis present

## 2016-09-04 DIAGNOSIS — M797 Fibromyalgia: Secondary | ICD-10-CM | POA: Diagnosis present

## 2016-09-04 DIAGNOSIS — M1711 Unilateral primary osteoarthritis, right knee: Principal | ICD-10-CM | POA: Diagnosis present

## 2016-09-04 DIAGNOSIS — G4730 Sleep apnea, unspecified: Secondary | ICD-10-CM | POA: Diagnosis present

## 2016-09-04 HISTORY — PX: TOTAL KNEE ARTHROPLASTY: SHX125

## 2016-09-04 LAB — RAPID URINE DRUG SCREEN, HOSP PERFORMED
AMPHETAMINES: NOT DETECTED
BENZODIAZEPINES: POSITIVE — AB
Barbiturates: NOT DETECTED
Cocaine: NOT DETECTED
OPIATES: NOT DETECTED
Tetrahydrocannabinol: POSITIVE — AB

## 2016-09-04 SURGERY — ARTHROPLASTY, KNEE, TOTAL
Anesthesia: Regional | Laterality: Right

## 2016-09-04 MED ORDER — FENTANYL CITRATE (PF) 100 MCG/2ML IJ SOLN
INTRAMUSCULAR | Status: AC
  Filled 2016-09-04: qty 2

## 2016-09-04 MED ORDER — DEXTROSE 5 % IV SOLN
3.0000 g | INTRAVENOUS | Status: AC
  Administered 2016-09-04: 3 g via INTRAVENOUS
  Filled 2016-09-04: qty 3000

## 2016-09-04 MED ORDER — ONDANSETRON HCL 4 MG/2ML IJ SOLN
4.0000 mg | Freq: Four times a day (QID) | INTRAMUSCULAR | Status: DC | PRN
  Administered 2016-09-04: 4 mg via INTRAVENOUS
  Filled 2016-09-04: qty 2

## 2016-09-04 MED ORDER — ACETAMINOPHEN 500 MG PO TABS
1000.0000 mg | ORAL_TABLET | Freq: Four times a day (QID) | ORAL | Status: AC
  Administered 2016-09-04 – 2016-09-05 (×4): 1000 mg via ORAL
  Filled 2016-09-04 (×4): qty 2

## 2016-09-04 MED ORDER — SODIUM CHLORIDE 0.9 % IV SOLN: INTRAVENOUS | Status: DC

## 2016-09-04 MED ORDER — TRANEXAMIC ACID 1000 MG/10ML IV SOLN
INTRAVENOUS | Status: DC | PRN
  Administered 2016-09-04: 2000 mg via TOPICAL

## 2016-09-04 MED ORDER — SODIUM CHLORIDE 0.9 % IR SOLN
Status: DC | PRN
  Administered 2016-09-04: 3000 mL
  Administered 2016-09-04: 1000 mL

## 2016-09-04 MED ORDER — SORBITOL 70 % SOLN: 30.0000 mL | Freq: Every day | Status: DC | PRN

## 2016-09-04 MED ORDER — DOCUSATE SODIUM 100 MG PO CAPS
100.0000 mg | ORAL_CAPSULE | Freq: Two times a day (BID) | ORAL | Status: DC
  Administered 2016-09-04 – 2016-09-05 (×3): 100 mg via ORAL
  Filled 2016-09-04 (×3): qty 1

## 2016-09-04 MED ORDER — SUGAMMADEX SODIUM 500 MG/5ML IV SOLN
INTRAVENOUS | Status: AC
  Filled 2016-09-04: qty 5

## 2016-09-04 MED ORDER — BUPIVACAINE LIPOSOME 1.3 % IJ SUSP
INTRAMUSCULAR | Status: DC | PRN
  Administered 2016-09-04: 20 mL

## 2016-09-04 MED ORDER — VORTIOXETINE HBR 10 MG PO TABS: 10.0000 mg | ORAL_TABLET | Freq: Every day | ORAL | Status: DC | PRN

## 2016-09-04 MED ORDER — APIXABAN 2.5 MG PO TABS
2.5000 mg | ORAL_TABLET | Freq: Two times a day (BID) | ORAL | Status: DC
  Administered 2016-09-05: 2.5 mg via ORAL
  Filled 2016-09-04 (×2): qty 1

## 2016-09-04 MED ORDER — PANTOPRAZOLE SODIUM 40 MG PO TBEC
80.0000 mg | DELAYED_RELEASE_TABLET | Freq: Every day | ORAL | Status: DC
  Administered 2016-09-05: 80 mg via ORAL
  Filled 2016-09-04 (×2): qty 2

## 2016-09-04 MED ORDER — MIDAZOLAM HCL 2 MG/2ML IJ SOLN
2.0000 mg | Freq: Once | INTRAMUSCULAR | Status: AC
  Administered 2016-09-04: 1 mg via INTRAVENOUS
  Filled 2016-09-04: qty 2

## 2016-09-04 MED ORDER — SODIUM CHLORIDE 0.9% FLUSH
INTRAVENOUS | Status: DC | PRN
  Administered 2016-09-04: 50 mL

## 2016-09-04 MED ORDER — TRANEXAMIC ACID 1000 MG/10ML IV SOLN
1000.0000 mg | Freq: Once | INTRAVENOUS | Status: AC
  Administered 2016-09-04: 1000 mg via INTRAVENOUS
  Filled 2016-09-04: qty 10

## 2016-09-04 MED ORDER — IPRATROPIUM-ALBUTEROL 0.5-2.5 (3) MG/3ML IN SOLN
3.0000 mL | Freq: Three times a day (TID) | RESPIRATORY_TRACT | Status: DC
  Administered 2016-09-05: 3 mL via RESPIRATORY_TRACT
  Filled 2016-09-04: qty 3

## 2016-09-04 MED ORDER — BUPIVACAINE LIPOSOME 1.3 % IJ SUSP
20.0000 mL | INTRAMUSCULAR | Status: DC
  Filled 2016-09-04: qty 20

## 2016-09-04 MED ORDER — DEXAMETHASONE SODIUM PHOSPHATE 10 MG/ML IJ SOLN
INTRAMUSCULAR | Status: AC
  Filled 2016-09-04: qty 1

## 2016-09-04 MED ORDER — ALBUTEROL SULFATE (2.5 MG/3ML) 0.083% IN NEBU: 2.5000 mg | INHALATION_SOLUTION | Freq: Four times a day (QID) | RESPIRATORY_TRACT | Status: DC | PRN

## 2016-09-04 MED ORDER — HYDROMORPHONE HCL 1 MG/ML IJ SOLN
1.0000 mg | INTRAMUSCULAR | Status: DC | PRN
  Administered 2016-09-04: 1 mg via INTRAVENOUS
  Filled 2016-09-04: qty 1

## 2016-09-04 MED ORDER — FENTANYL CITRATE (PF) 100 MCG/2ML IJ SOLN
INTRAMUSCULAR | Status: DC | PRN
  Administered 2016-09-04: 50 ug via INTRAVENOUS
  Administered 2016-09-04 (×6): 25 ug via INTRAVENOUS
  Administered 2016-09-04: 100 ug via INTRAVENOUS
  Administered 2016-09-04: 50 ug via INTRAVENOUS
  Administered 2016-09-04 (×4): 25 ug via INTRAVENOUS
  Administered 2016-09-04: 50 ug via INTRAVENOUS

## 2016-09-04 MED ORDER — FENTANYL CITRATE (PF) 100 MCG/2ML IJ SOLN
INTRAMUSCULAR | Status: AC
  Administered 2016-09-04: 100 ug
  Filled 2016-09-04: qty 2

## 2016-09-04 MED ORDER — PROMETHAZINE HCL 25 MG/ML IJ SOLN: 6.2500 mg | INTRAMUSCULAR | Status: DC | PRN

## 2016-09-04 MED ORDER — OXYCODONE-ACETAMINOPHEN 5-325 MG PO TABS: 1.0000 | ORAL_TABLET | ORAL | 0 refills | Status: DC | PRN

## 2016-09-04 MED ORDER — MOMETASONE FURO-FORMOTEROL FUM 200-5 MCG/ACT IN AERO
2.0000 | INHALATION_SPRAY | Freq: Two times a day (BID) | RESPIRATORY_TRACT | Status: DC
  Administered 2016-09-04 – 2016-09-05 (×2): 2 via RESPIRATORY_TRACT
  Filled 2016-09-04: qty 8.8

## 2016-09-04 MED ORDER — BUPIVACAINE-EPINEPHRINE (PF) 0.5% -1:200000 IJ SOLN
INTRAMUSCULAR | Status: AC
  Filled 2016-09-04: qty 30

## 2016-09-04 MED ORDER — PHENOL 1.4 % MT LIQD: 1.0000 | OROMUCOSAL | Status: DC | PRN

## 2016-09-04 MED ORDER — ONDANSETRON HCL 4 MG/2ML IJ SOLN
INTRAMUSCULAR | Status: DC | PRN
  Administered 2016-09-04: 4 mg via INTRAVENOUS

## 2016-09-04 MED ORDER — ONDANSETRON HCL 4 MG/2ML IJ SOLN
INTRAMUSCULAR | Status: AC
  Filled 2016-09-04: qty 2

## 2016-09-04 MED ORDER — LOSARTAN POTASSIUM 25 MG PO TABS
25.0000 mg | ORAL_TABLET | Freq: Every day | ORAL | Status: DC
  Administered 2016-09-04 – 2016-09-05 (×2): 25 mg via ORAL
  Filled 2016-09-04 (×2): qty 1

## 2016-09-04 MED ORDER — DEXAMETHASONE SODIUM PHOSPHATE 4 MG/ML IJ SOLN
INTRAMUSCULAR | Status: DC | PRN
  Administered 2016-09-04: 10 mg via INTRAVENOUS

## 2016-09-04 MED ORDER — CHLORHEXIDINE GLUCONATE 4 % EX LIQD: 60.0000 mL | Freq: Once | CUTANEOUS | Status: DC

## 2016-09-04 MED ORDER — PHENYLEPHRINE HCL 10 MG/ML IJ SOLN
INTRAMUSCULAR | Status: DC | PRN
Start: 2016-09-04 — End: 2016-09-04
  Administered 2016-09-04: 40 ug via INTRAVENOUS

## 2016-09-04 MED ORDER — OXYCODONE HCL 5 MG PO TABS
5.0000 mg | ORAL_TABLET | ORAL | Status: DC | PRN
  Administered 2016-09-04: 10 mg via ORAL
  Administered 2016-09-04: 5 mg via ORAL
  Administered 2016-09-05 (×5): 10 mg via ORAL
  Filled 2016-09-04 (×7): qty 2

## 2016-09-04 MED ORDER — METOCLOPRAMIDE HCL 5 MG/ML IJ SOLN: 5.0000 mg | Freq: Three times a day (TID) | INTRAMUSCULAR | Status: DC | PRN

## 2016-09-04 MED ORDER — ONDANSETRON HCL 4 MG PO TABS
4.0000 mg | ORAL_TABLET | Freq: Four times a day (QID) | ORAL | Status: DC | PRN
  Administered 2016-09-04: 4 mg via ORAL
  Filled 2016-09-04: qty 1

## 2016-09-04 MED ORDER — PROPOFOL 10 MG/ML IV BOLUS
INTRAVENOUS | Status: AC
  Filled 2016-09-04: qty 20

## 2016-09-04 MED ORDER — FLEET ENEMA 7-19 GM/118ML RE ENEM: 1.0000 | ENEMA | Freq: Once | RECTAL | Status: DC | PRN

## 2016-09-04 MED ORDER — METOCLOPRAMIDE HCL 5 MG PO TABS: 5.0000 mg | ORAL_TABLET | Freq: Three times a day (TID) | ORAL | Status: DC | PRN

## 2016-09-04 MED ORDER — FUROSEMIDE 40 MG PO TABS
80.0000 mg | ORAL_TABLET | Freq: Every day | ORAL | Status: DC
Start: 2016-09-04 — End: 2016-09-05
  Administered 2016-09-04 – 2016-09-05 (×2): 80 mg via ORAL
  Filled 2016-09-04 (×2): qty 2

## 2016-09-04 MED ORDER — APIXABAN 2.5 MG PO TABS: 2.5000 mg | ORAL_TABLET | Freq: Two times a day (BID) | ORAL | 0 refills | Status: DC

## 2016-09-04 MED ORDER — ZOLPIDEM TARTRATE 5 MG PO TABS: 5.0000 mg | ORAL_TABLET | Freq: Every day | ORAL | Status: DC

## 2016-09-04 MED ORDER — PROPOFOL 10 MG/ML IV BOLUS
INTRAVENOUS | Status: DC | PRN
  Administered 2016-09-04: 200 mg via INTRAVENOUS
  Administered 2016-09-04: 100 mg via INTRAVENOUS

## 2016-09-04 MED ORDER — OXYCODONE HCL 5 MG PO TABS
ORAL_TABLET | ORAL | Status: AC
  Filled 2016-09-04: qty 2

## 2016-09-04 MED ORDER — NICOTINE 7 MG/24HR TD PT24
7.0000 mg | MEDICATED_PATCH | Freq: Every day | TRANSDERMAL | Status: DC
  Administered 2016-09-04: 7 mg via TRANSDERMAL
  Filled 2016-09-04 (×2): qty 1

## 2016-09-04 MED ORDER — ACETAMINOPHEN 325 MG PO TABS: 650.0000 mg | ORAL_TABLET | Freq: Four times a day (QID) | ORAL | Status: DC | PRN

## 2016-09-04 MED ORDER — ACETAMINOPHEN 650 MG RE SUPP: 650.0000 mg | Freq: Four times a day (QID) | RECTAL | Status: DC | PRN

## 2016-09-04 MED ORDER — SUGAMMADEX SODIUM 200 MG/2ML IV SOLN
INTRAVENOUS | Status: DC | PRN
  Administered 2016-09-04: 200 mg via INTRAVENOUS

## 2016-09-04 MED ORDER — IPRATROPIUM-ALBUTEROL 0.5-2.5 (3) MG/3ML IN SOLN
3.0000 mL | Freq: Four times a day (QID) | RESPIRATORY_TRACT | Status: DC
  Administered 2016-09-04 (×2): 3 mL via RESPIRATORY_TRACT
  Filled 2016-09-04 (×2): qty 3

## 2016-09-04 MED ORDER — POLYETHYLENE GLYCOL 3350 17 G PO PACK: 17.0000 g | PACK | Freq: Every day | ORAL | Status: DC | PRN

## 2016-09-04 MED ORDER — MIDAZOLAM HCL 2 MG/2ML IJ SOLN
INTRAMUSCULAR | Status: AC
  Filled 2016-09-04: qty 2

## 2016-09-04 MED ORDER — LIDOCAINE 2% (20 MG/ML) 5 ML SYRINGE
INTRAMUSCULAR | Status: AC
  Filled 2016-09-04: qty 5

## 2016-09-04 MED ORDER — GABAPENTIN 300 MG PO CAPS
600.0000 mg | ORAL_CAPSULE | Freq: Three times a day (TID) | ORAL | Status: DC
  Administered 2016-09-04: 600 mg via ORAL
  Filled 2016-09-04: qty 2

## 2016-09-04 MED ORDER — MENTHOL 3 MG MT LOZG: 1.0000 | LOZENGE | OROMUCOSAL | Status: DC | PRN

## 2016-09-04 MED ORDER — SUGAMMADEX SODIUM 200 MG/2ML IV SOLN
INTRAVENOUS | Status: AC
  Filled 2016-09-04: qty 2

## 2016-09-04 MED ORDER — ROCURONIUM BROMIDE 100 MG/10ML IV SOLN
INTRAVENOUS | Status: DC | PRN
  Administered 2016-09-04: 30 mg via INTRAVENOUS
  Administered 2016-09-04: 10 mg via INTRAVENOUS

## 2016-09-04 MED ORDER — LIDOCAINE HCL (CARDIAC) 20 MG/ML IV SOLN
INTRAVENOUS | Status: DC | PRN
  Administered 2016-09-04: 30 mg via INTRAVENOUS

## 2016-09-04 MED ORDER — LACTATED RINGERS IV SOLN
INTRAVENOUS | Status: DC | PRN
  Administered 2016-09-04 (×3): via INTRAVENOUS

## 2016-09-04 MED ORDER — PAROXETINE HCL 20 MG PO TABS
10.0000 mg | ORAL_TABLET | Freq: Every day | ORAL | Status: DC
  Administered 2016-09-04: 10 mg via ORAL
  Filled 2016-09-04 (×2): qty 1

## 2016-09-04 MED ORDER — FENTANYL CITRATE (PF) 100 MCG/2ML IJ SOLN: 25.0000 ug | INTRAMUSCULAR | Status: DC | PRN

## 2016-09-04 MED ORDER — MIDAZOLAM HCL 5 MG/5ML IJ SOLN
INTRAMUSCULAR | Status: DC | PRN
  Administered 2016-09-04: 2 mg via INTRAVENOUS

## 2016-09-04 MED ORDER — BUPIVACAINE-EPINEPHRINE 0.5% -1:200000 IJ SOLN
INTRAMUSCULAR | Status: DC | PRN
  Administered 2016-09-04: 50 mL

## 2016-09-04 MED ORDER — FENTANYL CITRATE (PF) 100 MCG/2ML IJ SOLN
INTRAMUSCULAR | Status: AC
  Administered 2016-09-04: 50 ug
  Filled 2016-09-04: qty 2

## 2016-09-04 MED ORDER — BUPIVACAINE-EPINEPHRINE (PF) 0.5% -1:200000 IJ SOLN
INTRAMUSCULAR | Status: DC | PRN
  Administered 2016-09-04: 25 mL via PERINEURAL

## 2016-09-04 MED ORDER — CARVEDILOL 3.125 MG PO TABS
3.1250 mg | ORAL_TABLET | Freq: Two times a day (BID) | ORAL | Status: DC
  Administered 2016-09-04 – 2016-09-05 (×2): 3.125 mg via ORAL
  Filled 2016-09-04 (×2): qty 1

## 2016-09-04 MED ORDER — TRANEXAMIC ACID 1000 MG/10ML IV SOLN
2000.0000 mg | INTRAVENOUS | Status: DC
  Filled 2016-09-04: qty 20

## 2016-09-04 MED ORDER — CEFAZOLIN SODIUM-DEXTROSE 2-4 GM/100ML-% IV SOLN
2.0000 g | Freq: Four times a day (QID) | INTRAVENOUS | Status: AC
  Administered 2016-09-04 – 2016-09-05 (×2): 2 g via INTRAVENOUS
  Filled 2016-09-04 (×2): qty 100

## 2016-09-04 MED ORDER — SUCCINYLCHOLINE CHLORIDE 20 MG/ML IJ SOLN
INTRAMUSCULAR | Status: DC | PRN
  Administered 2016-09-04: 50 mg via INTRAVENOUS

## 2016-09-04 SURGICAL SUPPLY — 66 items
BANDAGE ACE 4X5 VEL STRL LF (GAUZE/BANDAGES/DRESSINGS) ×3 IMPLANT
BANDAGE ACE 6X5 VEL STRL LF (GAUZE/BANDAGES/DRESSINGS) ×3 IMPLANT
BANDAGE ESMARK 6X9 LF (GAUZE/BANDAGES/DRESSINGS) ×1 IMPLANT
BLADE SAGITTAL 25.0X1.19X90 (BLADE) ×2 IMPLANT
BLADE SAGITTAL 25.0X1.19X90MM (BLADE) ×1
BLADE SAW SAG 90X13X1.27 (BLADE) ×3 IMPLANT
BNDG CMPR 9X6 STRL LF SNTH (GAUZE/BANDAGES/DRESSINGS) ×1
BNDG ESMARK 6X9 LF (GAUZE/BANDAGES/DRESSINGS) ×3
BOWL SMART MIX CTS (DISPOSABLE) ×3 IMPLANT
CAP KNEE TOTAL 3 SIGMA ×2 IMPLANT
CEMENT HV SMART SET (Cement) ×4 IMPLANT
COVER SURGICAL LIGHT HANDLE (MISCELLANEOUS) ×3 IMPLANT
CUFF TOURNIQUET SINGLE 34IN LL (TOURNIQUET CUFF) ×3 IMPLANT
CUFF TOURNIQUET SINGLE 44IN (TOURNIQUET CUFF) IMPLANT
DRAPE INCISE IOBAN 66X45 STRL (DRAPES) IMPLANT
DRAPE ORTHO SPLIT 77X108 STRL (DRAPES) ×6
DRAPE SURG ORHT 6 SPLT 77X108 (DRAPES) ×2 IMPLANT
DRAPE U-SHAPE 47X51 STRL (DRAPES) ×3 IMPLANT
DRSG ADAPTIC 3X8 NADH LF (GAUZE/BANDAGES/DRESSINGS) ×3 IMPLANT
DRSG PAD ABDOMINAL 8X10 ST (GAUZE/BANDAGES/DRESSINGS) ×6 IMPLANT
DURAPREP 26ML APPLICATOR (WOUND CARE) ×3 IMPLANT
ELECT REM PT RETURN 9FT ADLT (ELECTROSURGICAL) ×3
ELECTRODE REM PT RTRN 9FT ADLT (ELECTROSURGICAL) ×1 IMPLANT
EVACUATOR 1/8 PVC DRAIN (DRAIN) IMPLANT
FACESHIELD WRAPAROUND (MASK) ×6 IMPLANT
FACESHIELD WRAPAROUND OR TEAM (MASK) ×2 IMPLANT
FLOSEAL 10ML (HEMOSTASIS) IMPLANT
GAUZE SPONGE 4X4 12PLY STRL (GAUZE/BANDAGES/DRESSINGS) ×3 IMPLANT
GLOVE BIOGEL PI IND STRL 8 (GLOVE) ×4 IMPLANT
GLOVE BIOGEL PI INDICATOR 8 (GLOVE) ×8
GLOVE ORTHO TXT STRL SZ7.5 (GLOVE) ×3 IMPLANT
GLOVE SURG ORTHO 8.0 STRL STRW (GLOVE) ×7 IMPLANT
GOWN STRL REUS W/ TWL LRG LVL3 (GOWN DISPOSABLE) ×2 IMPLANT
GOWN STRL REUS W/ TWL XL LVL3 (GOWN DISPOSABLE) ×1 IMPLANT
GOWN STRL REUS W/TWL 2XL LVL3 (GOWN DISPOSABLE) ×3 IMPLANT
GOWN STRL REUS W/TWL LRG LVL3 (GOWN DISPOSABLE) ×6
GOWN STRL REUS W/TWL XL LVL3 (GOWN DISPOSABLE) ×3
HANDPIECE INTERPULSE COAX TIP (DISPOSABLE) ×3
HOOD PEEL AWAY FACE SHEILD DIS (HOOD) ×3 IMPLANT
IMMOBILIZER KNEE 22 UNIV (SOFTGOODS) IMPLANT
KIT BASIN OR (CUSTOM PROCEDURE TRAY) ×3 IMPLANT
KIT ROOM TURNOVER OR (KITS) ×3 IMPLANT
MANIFOLD NEPTUNE II (INSTRUMENTS) ×3 IMPLANT
NEEDLE 22X1 1/2 (OR ONLY) (NEEDLE) ×6 IMPLANT
NS IRRIG 1000ML POUR BTL (IV SOLUTION) ×3 IMPLANT
PACK TOTAL JOINT (CUSTOM PROCEDURE TRAY) ×3 IMPLANT
PAD ABD 8X10 STRL (GAUZE/BANDAGES/DRESSINGS) ×4 IMPLANT
PAD ARMBOARD 7.5X6 YLW CONV (MISCELLANEOUS) ×6 IMPLANT
PAD CAST 4YDX4 CTTN HI CHSV (CAST SUPPLIES) ×1 IMPLANT
PADDING CAST COTTON 4X4 STRL (CAST SUPPLIES) ×3
PADDING CAST COTTON 6X4 STRL (CAST SUPPLIES) ×3 IMPLANT
SET HNDPC FAN SPRY TIP SCT (DISPOSABLE) ×1 IMPLANT
SPONGE GAUZE 4X4 12PLY STER LF (GAUZE/BANDAGES/DRESSINGS) ×2 IMPLANT
STAPLER VISISTAT 35W (STAPLE) ×3 IMPLANT
SUCTION FRAZIER HANDLE 10FR (MISCELLANEOUS) ×2
SUCTION TUBE FRAZIER 10FR DISP (MISCELLANEOUS) ×1 IMPLANT
SUT ETHIBOND NAB CT1 #1 30IN (SUTURE) ×9 IMPLANT
SUT VIC AB 0 CT1 27 (SUTURE) ×3
SUT VIC AB 0 CT1 27XBRD ANBCTR (SUTURE) ×1 IMPLANT
SUT VIC AB 2-0 CT1 27 (SUTURE) ×6
SUT VIC AB 2-0 CT1 TAPERPNT 27 (SUTURE) ×2 IMPLANT
SYR CONTROL 10ML LL (SYRINGE) ×6 IMPLANT
TOWEL OR 17X24 6PK STRL BLUE (TOWEL DISPOSABLE) ×3 IMPLANT
TOWEL OR 17X26 10 PK STRL BLUE (TOWEL DISPOSABLE) ×3 IMPLANT
TRAY FOLEY CATH 16FRSI W/METER (SET/KITS/TRAYS/PACK) IMPLANT
WATER STERILE IRR 1000ML POUR (IV SOLUTION) ×3 IMPLANT

## 2016-09-04 NOTE — Anesthesia Postprocedure Evaluation (Signed)
Anesthesia Post Note  Patient: Chelsea Ramsey  Procedure(s) Performed: Procedure(s) (LRB): TOTAL KNEE ARTHROPLASTY; with lateral release (Right)  Patient location during evaluation: PACU Anesthesia Type: General Level of consciousness: awake and alert Pain management: pain level controlled Vital Signs Assessment: post-procedure vital signs reviewed and stable Respiratory status: spontaneous breathing, nonlabored ventilation, respiratory function stable and patient connected to nasal cannula oxygen Cardiovascular status: blood pressure returned to baseline and stable Postop Assessment: no signs of nausea or vomiting Anesthetic complications: no    Last Vitals:  Vitals:   09/04/16 0800 09/04/16 1316  BP: (!) 96/54   Pulse: 89   Resp: 20 16  Temp: 36.6 C 36.4 C    Last Pain:  Vitals:   09/04/16 1354  TempSrc:   PainSc: [place]

## 2016-09-04 NOTE — Anesthesia Preprocedure Evaluation (Signed)
Anesthesia Evaluation  Patient identified by MRN, date of birth, ID band Patient awake    Reviewed: Allergy & Precautions, NPO status , Patient's Chart, lab work & pertinent test results  Airway        Dental   Pulmonary asthma , sleep apnea , COPD,  COPD inhaler, Current Smoker,           Cardiovascular hypertension, Pt. on home beta blockers and Pt. on medications + CAD and +CHF    Echo 05/07/15 (care everywhere):  - Probablerheumatic heartdisease -Normalleft ventricularejection [id number]) - Diastolicleft ventriculardysfunction - Elevatedleft ventricularfilling pressures - Degenerativemitral valvedisease - Mitralregurgitation (mildto moderate) -Mitralstenosis(mild) - Dilatedleft atrium - Tricuspidregurgitation(moderate) -Pulmonaryhypertension(severe)   Neuro/Psych PSYCHIATRIC DISORDERS Anxiety negative neurological ROS     GI/Hepatic Neg liver ROS, GERD  Medicated,  Endo/Other  Morbid obesity  Renal/GU negative Renal ROS  negative genitourinary   Musculoskeletal negative musculoskeletal ROS (+) Arthritis , Fibromyalgia -  Abdominal   Peds negative pediatric ROS (+)  Hematology negative hematology ROS (+)   Anesthesia Other Findings   Reproductive/Obstetrics negative OB ROS                             Anesthesia Physical Anesthesia Plan  ASA: III  Anesthesia Plan: General and Regional   Post-op Pain Management: GA combined w/ Regional for post-op pain   Induction:   Airway Management Planned:   Additional Equipment:   Intra-op Plan:   Post-operative Plan: Extubation in OR  Informed Consent: I have reviewed the patients History and Physical, chart, labs and discussed the procedure including the risks, benefits and alternatives for the proposed anesthesia with the patient or authorized representative who has indicated his/her  understanding and acceptance.   Dental advisory given  Plan Discussed with:   Anesthesia Plan Comments:         Anesthesia Quick Evaluation

## 2016-09-04 NOTE — Anesthesia Procedure Notes (Signed)
Anesthesia Regional Block:  Adductor canal block  Pre-Anesthetic Checklist: ,, timeout performed, Correct Patient, Correct Site, Correct Laterality, Correct Procedure, Correct Position, site marked, Risks and benefits discussed,  Surgical consent,  Pre-op evaluation,  At surgeon's request and post-op pain management  Laterality: Right  Prep: chloraprep       Needles:   Needle Type: Echogenic Needle     Needle Length: 5cm 5 cm Needle Gauge: 22 and 22 G    Additional Needles:  Procedures: ultrasound guided (picture in chart) Adductor canal block Narrative:  Injection made incrementally with aspirations every 25 mL.  Performed by: Personally  Anesthesiologist: Reginal Lutes  Additional Notes: Patient tolerated procedure well.

## 2016-09-04 NOTE — Brief Op Note (Signed)
09/04/2016  1:16 PM  PATIENT:  Chelsea Ramsey  50 y.o. female  PRE-OPERATIVE DIAGNOSIS:  Osteoarthritis right knee  POST-OPERATIVE DIAGNOSIS:  Osteoarthritis right knee  PROCEDURE:  Procedure(s): TOTAL KNEE ARTHROPLASTY; with lateral release (Right)  SURGEON:  Surgeon(s) and Role:    * Earlie Server, MD - Primary  PHYSICIAN ASSISTANT:  Chriss Czar, PA-C  ASSISTANTS: none   ANESTHESIA:   local, regional and general  EBL:  Total I/O In: 2000 [I.V.:2000] Out: 200 [Blood:200]  BLOOD ADMINISTERED:none  DRAINS: none   LOCAL MEDICATIONS USED:  MARCAINE     SPECIMEN:  No Specimen  DISPOSITION OF SPECIMEN:  N/A  COUNTS:  YES  TOURNIQUET:   Total Tourniquet Time Documented: Thigh (Right) - 74 minutes Total: Thigh (Right) - 74 minutes   DICTATION: .Other Dictation: Dictation Number Unknown  PLAN OF CARE: Admit to inpatient   PATIENT DISPOSITION:  PACU - hemodynamically stable.   Delay start of Pharmacological VTE agent (>24hrs) due to surgical blood loss or risk of bleeding: yes

## 2016-09-04 NOTE — Interval H&P Note (Signed)
History and Physical Interval Note:  09/04/2016 9:22 AM  Chelsea Ramsey  has presented today for surgery, with the diagnosis of OA RIGHT KNEE  The various methods of treatment have been discussed with the patient and family. After consideration of risks, benefits and other options for treatment, the patient has consented to  Procedure(s): TOTAL KNEE ARTHROPLASTY (Right) as a surgical intervention .  The patient's history has been reviewed, patient examined, no change in status, stable for surgery.  I have reviewed the patient's chart and labs.  Questions were answered to the patient's satisfaction.     Nafisa Olds JR,W D

## 2016-09-04 NOTE — Anesthesia Preprocedure Evaluation (Addendum)
Anesthesia Evaluation    Airway Mallampati: III  TM Distance: >3 FB Neck ROM: Full    Dental  (+) Edentulous Upper, Edentulous Lower   Pulmonary asthma , sleep apnea ,  COPD inhaler, Current Smoker,    Pulmonary exam normal        Cardiovascular hypertension, Normal cardiovascular exam     Neuro/Psych    GI/Hepatic GERD  Medicated,  Endo/Other    Renal/GU      Musculoskeletal  (+) Arthritis , Fibromyalgia -  Abdominal   Peds  Hematology   Anesthesia Other Findings   Reproductive/Obstetrics                            Anesthesia Physical Anesthesia Plan  ASA: III  Anesthesia Plan: General and Regional   Post-op Pain Management: GA combined w/ Regional for post-op pain   Induction: Intravenous  Airway Management Planned: LMA and Oral ETT  Additional Equipment:   Intra-op Plan:   Post-operative Plan: Extubation in OR  Informed Consent: I have reviewed the patients History and Physical, chart, labs and discussed the procedure including the risks, benefits and alternatives for the proposed anesthesia with the patient or authorized representative who has indicated his/her understanding and acceptance.   Dental advisory given  Plan Discussed with:   Anesthesia Plan Comments:        Anesthesia Quick Evaluation

## 2016-09-04 NOTE — H&P (View-Only) (Signed)
TOTAL KNEE ADMISSION H&P  Patient is being admitted for right total knee arthroplasty.  Subjective:  Chief Complaint:right knee pain.  HPI: Chelsea Ramsey, 50 y.o. female, has a history of pain and functional disability in the right knee due to arthritis and has failed non-surgical conservative treatments for greater than 12 weeks to includeNSAID's and/or analgesics, corticosteriod injections, viscosupplementation injections and activity modification.  Onset of symptoms was gradual, starting >10 years ago with gradually worsening course since that time. The patient noted no past surgery on the right knee(s).  Patient currently rates pain in the right knee(s) at 10 out of 10 with activity. Patient has night pain, worsening of pain with activity and weight bearing, pain that interferes with activities of daily living, pain with passive range of motion, crepitus and joint swelling.  Patient has evidence of periarticular osteophytes and joint space narrowing by imaging studies. There is no active infection.  There are no active problems to display for this patient.  Past Medical History:  Diagnosis Date  . Allergy    seasonal  . Arthritis    Right Knee  . Asthma   . CHF (congestive heart failure) ([place])   . COPD (chronic obstructive pulmonary disease) ([place])   . Coronary artery disease    Leaky heart valve  . Fibromyalgia   . GERD (gastroesophageal reflux disease)   . Hypertension   . PUD (peptic ulcer disease)   . Pulmonary HTN ([place])     Past Surgical History:  Procedure Laterality Date  . ADENOIDECTOMY    . TONSILLECTOMY       (Not in a hospital admission) Allergies  Allergen Reactions  . Aspirin Swelling  . Flexeril [Cyclobenzaprine] Swelling  . Morphine And Related Swelling  . Codeine Rash  . Tramadol Rash    Social History  Substance Use Topics  . Smoking status: Current Every Day Smoker    Types: Cigarettes  . Smokeless tobacco: Not on file  . Alcohol use 1.8  oz/week    3 Cans of beer per week     Comment: 16 oz per week    Family History  Problem Relation Age of Onset  . COPD Mother   . Cancer Mother     Bone  . Asthma Mother   . Congestive Heart Failure Father      Review of Systems  Respiratory: Positive for shortness of breath.   Cardiovascular: Positive for chest pain.  Psychiatric/Behavioral: The patient is nervous/anxious.   All other systems reviewed and are negative.   Objective:  Physical Exam  Constitutional: She is oriented to person, place, and time. She appears well-developed and well-nourished. No distress.  HENT:  Head: Normocephalic and atraumatic.  Nose: Nose normal.  Eyes: Conjunctivae and EOM are normal. Pupils are equal, round, and reactive to light.  Neck: Normal range of motion. Neck supple.  Cardiovascular: Normal rate, regular rhythm, normal heart sounds and intact distal pulses.   Respiratory: Effort normal and breath sounds normal. No respiratory distress. She has no wheezes.  GI: Soft. Bowel sounds are normal. She exhibits no distension. There is no tenderness.  Musculoskeletal:       Right knee: She exhibits decreased range of motion and swelling. She exhibits no effusion and no erythema. Tenderness found.  Lymphadenopathy:    She has no cervical adenopathy.  Neurological: She is alert and oriented to person, place, and time. No cranial nerve deficit.  Skin: Skin is warm and dry. No rash noted. No erythema.  Psychiatric: She has a normal mood and affect. Her behavior is normal.    Vital signs in last 24 hours: @VSRANGES @  Labs:   Estimated body mass index is 43.6 kg/m as calculated from the following:   Height as of 12/10/15: 5\' 5"  (1.651 m).   Weight as of 12/10/15: 118.8 kg (262 lb).   Imaging Review Plain radiographs demonstrate moderate degenerative joint disease of the right knee(s). The overall alignment ismild valgus. The bone quality appears to be good for age and reported activity  level.  Assessment/Plan:  End stage arthritis, right knee   The patient history, physical examination, clinical judgment of the provider and imaging studies are consistent with end stage degenerative joint disease of the right knee(s) and total knee arthroplasty is deemed medically necessary. The treatment options including medical management, injection therapy arthroscopy and arthroplasty were discussed at length. The risks and benefits of total knee arthroplasty were presented and reviewed. The risks due to aseptic loosening, infection, stiffness, patella tracking problems, thromboembolic complications and other imponderables were discussed. The patient acknowledged the explanation, agreed to proceed with the plan and consent was signed. Patient is being admitted for inpatient treatment for surgery, pain control, PT, OT, prophylactic antibiotics, VTE prophylaxis, progressive ambulation and ADL's and discharge planning. The patient is planning to be discharged home with home health services

## 2016-09-04 NOTE — Progress Notes (Signed)
Orthopedic Tech Progress Note Patient Details:  Chelsea Ramsey [birth year] [id number]  CPM Right Knee CPM Right Knee: On Right Knee Flexion (Degrees): 9 Right Knee Extension (Degrees): 0 Additional Comments: trapeze bar patient helper   Hildred Priest 09/04/2016, 2:18 PM Viewed order from doctor's order list

## 2016-09-04 NOTE — Anesthesia Procedure Notes (Signed)
Procedure Name: Intubation Date/Time: 09/04/2016 10:47 AM Performed by: Marrianne Mood Pre-anesthesia Checklist: Patient identified, Emergency Drugs available, Suction available, Patient being monitored and Timeout performed Patient Re-evaluated:Patient Re-evaluated prior to inductionOxygen Delivery Method: Circle system utilized Preoxygenation: Pre-oxygenation with 100% oxygen Intubation Type: IV induction Ventilation: Mask ventilation without difficulty Laryngoscope Size: Miller and 3 Tube type: Oral Tube size: 7.5 mm Number of attempts: 1 Airway Equipment and Method: Stylet and Oral airway Placement Confirmation: ETT inserted through vocal cords under direct vision,  positive ETCO2 and breath sounds checked- equal and bilateral Secured at: 21 cm Tube secured with: Tape Dental Injury: Teeth and Oropharynx as per pre-operative assessment

## 2016-09-04 NOTE — Transfer of Care (Signed)
Immediate Anesthesia Transfer of Care Note  Patient: Chelsea Ramsey  Procedure(s) Performed: Procedure(s): TOTAL KNEE ARTHROPLASTY; with lateral release (Right)  Patient Location: PACU  Anesthesia Type:GA combined with regional for post-op pain  Level of Consciousness: awake and patient cooperative  Airway & Oxygen Therapy: Patient Spontanous Breathing and Patient connected to face mask oxygen  Post-op Assessment: Report given to RN and Post -op Vital signs reviewed and stable  Post vital signs: Reviewed and stable  Last Vitals:  Vitals:   09/04/16 0800 09/04/16 1316  BP: (!) 96/54   Pulse: 89   Resp: 20 16  Temp: 36.6 C 36.4 C    Last Pain:  Vitals:   09/04/16 0800  TempSrc: Oral      Patients Stated Pain Goal: 2 ([id number])  Complications: No apparent anesthesia complications

## 2016-09-05 LAB — CBC
HCT: 33 % — ABNORMAL LOW (ref 36.0–46.0)
Hemoglobin: 9.9 g/dL — ABNORMAL LOW (ref 12.0–15.0)
MCH: 27.6 pg (ref 26.0–34.0)
MCHC: 30 g/dL (ref 30.0–36.0)
MCV: 91.9 fL (ref 78.0–100.0)
PLATELETS: 225 10*3/uL (ref 150–400)
RBC: 3.59 MIL/uL — AB (ref 3.87–5.11)
RDW: 13.1 % (ref 11.5–15.5)
WBC: 13.4 10*3/uL — ABNORMAL HIGH (ref 4.0–10.5)

## 2016-09-05 LAB — BASIC METABOLIC PANEL
Anion gap: 11 (ref 5–15)
BUN: 8 mg/dL (ref 6–20)
CHLORIDE: 102 mmol/L (ref 101–111)
CO2: 28 mmol/L (ref 22–32)
Calcium: 8.8 mg/dL — ABNORMAL LOW (ref 8.9–10.3)
Creatinine, Ser: 0.66 mg/dL (ref 0.44–1.00)
GFR calc Af Amer: >60 mL/min (ref >60)
GFR calc non Af Amer: >60 mL/min (ref >60)
GLUCOSE: 116 mg/dL — AB (ref 65–99)
POTASSIUM: 3.9 mmol/L (ref 3.5–5.1)
Sodium: 141 mmol/L (ref 135–145)

## 2016-09-05 NOTE — Progress Notes (Signed)
   Assessment: 1 Day Post-Op  S/P Procedure(s) (LRB): TOTAL KNEE ARTHROPLASTY; with lateral release (Right) by Dr. French Ana on 09/04/16  Active Problems:   Primary localized osteoarthritis of right knee  Doing well.  Wanting to go home later today if possible / PT goes well.  Plan: Up with therapy  Weight Bearing: Weight Bearing as Tolerated (WBAT) right leg Dressings: reinforce prn.  VTE prophylaxis: Eliquis, SCDs, ambulation Dispo: Home  In care of husband and family - possibly later today pending PT eval.  Subjective: Patient reports pain as moderate. Pain controlled PO meds.  Tolerating diet.  Urinating.  +Flatus.  No CP, SOB.  OOB yesterday.  Objective:   VITALS:   Vitals:   09/04/16 2016 09/04/16 2107 09/05/16 0027 09/05/16 0452  BP: 114/77  106/69 (!) 98/55  Pulse: (!) 108  94 95  Resp:      Temp: 97.8 F (36.6 C)  97.6 F (36.4 C) 97.8 F (36.6 C)  TempSrc: Oral  Oral Oral  SpO2: 99% 98% 99% 100%  Weight:      Height:       CBC Latest Ref Rng & Units 09/05/2016 08/24/2016 02/20/2015  WBC 4.0 - 10.5 K/uL 13.4(H) 9.6 7.7  Hemoglobin 12.0 - 15.0 g/dL 9.9(L) 12.8 13.3  Hematocrit 36.0 - 46.0 % 33.0(L) 40.8 41.4  Platelets 150 - 400 K/uL 225 283 182   BMP Latest Ref Rng & Units 09/05/2016 08/24/2016 02/20/2015  Glucose 65 - 99 mg/dL 116(H) 103(H) 97  BUN 6 - 20 mg/dL 8 11 14   Creatinine 0.44 - 1.00 mg/dL 0.66 0.67 0.58  Sodium 135 - 145 mmol/L 141 140 138  Potassium 3.5 - 5.1 mmol/L 3.9 3.8 3.7  Chloride 101 - 111 mmol/L 102 104 104  CO2 22 - 32 mmol/L 28 25 26   Calcium 8.9 - 10.3 mg/dL 8.8(L) 9.6 9.0   Intake/Output      10/06 0701 - 10/07 0700 10/07 0701 - 10/08 0700   P.O. 150    I.V. (mL/kg) 2000 (16.8)    Total Intake(mL/kg) 2150 (18.1)    Blood 200    Total Output 200     Net +1950          Urine Occurrence 2 x      General: NAD.  Pleasant. Resp: No increased wob Cardio: regular rate and rhythm ABD protuberant, soft Neurologically  intact MSK Neurovascularly intact Sensation intact distally Intact pulses distally Dorsiflexion/Plantar flexion intact Incision: dressing C/D/I  Chelsea Ramsey 09/05/2016, 7:02 AM

## 2016-09-05 NOTE — Evaluation (Signed)
Occupational Therapy Evaluation Patient Details Name: Chelsea Ramsey MRN: [id number] DOB: 11-24-1966 Today's Date: 09/05/2016    History of Present Illness Pt is a 50 y/o female s/p R TKA. PMH including but not limited CHF, COPD, CAD, and pulmonary HTN.   Clinical Impression   Pt reports she was independent with ADL PTA. Currently pt overall min guard for functional mobility, supervision for seated UB ADL, and mod assist for LB ADL. Pt planning to d/c home with 24/7 supervision from family. Pt would benefit from continued skilled OT to address established goals.    Follow Up Recommendations  No OT follow up;Supervision/Assistance - 24 hour    Equipment Recommendations  Tub/shower seat    Recommendations for Other Services       Precautions / Restrictions Precautions Precautions: Knee;Fall Precaution Booklet Issued: No Restrictions Weight Bearing Restrictions: Yes RLE Weight Bearing: Weight bearing as tolerated      Mobility Bed Mobility Overal bed mobility: Needs Assistance Bed Mobility: Supine to Sit;Sit to Supine     Supine to sit: Supervision;HOB elevated Sit to supine: Supervision;HOB elevated   General bed mobility comments: No physical assist required. Increased time and use of bed rails/HOB elevated  Transfers Overall transfer level: Needs assistance Equipment used: Rolling walker (2 wheeled) Transfers: Sit to/from Stand Sit to Stand: Min guard         General transfer comment: Cues for hand placement and technique.    Balance Overall balance assessment: Needs assistance Sitting-balance support: Feet supported;No upper extremity supported Sitting balance-Leahy Scale: Good     Standing balance support: No upper extremity supported;During functional activity Standing balance-Leahy Scale: Fair Standing balance comment: Able to stand at sink and wash hands without UE support                            ADL Overall ADL's : Needs  assistance/impaired Eating/Feeding: Set up;Sitting   Grooming: Supervision/safety;Standing;Wash/dry hands   Upper Body Bathing: Set up;Supervision/ safety;Sitting   Lower Body Bathing: Moderate assistance;Sit to/from stand   Upper Body Dressing : Set up;Supervision/safety;Sitting   Lower Body Dressing: Moderate assistance;Sit to/from stand Lower Body Dressing Details (indicate cue type and reason): Required assist for socks. Educated on compensatory strategies for LB ADL. Husband to assist with ADL as needed.  Toilet Transfer: Min guard;Ambulation;Regular Toilet;Grab bars;RW Armed forces technical officer Details (indicate cue type and reason): Simulated home environment Toileting- Clothing Manipulation and Hygiene: Supervision/safety;Sitting/lateral lean Toileting - Clothing Manipulation Details (indicate cue type and reason): for peri care     Functional mobility during ADLs: Min guard;Rolling walker General ADL Comments: Verbally educated pt on tub transfer technique and recommended trial with [facility] therapist prior to attempting on own.     Vision Vision Assessment?: No apparent visual deficits   Perception     Praxis      Pertinent Vitals/Pain Pain Assessment: 0-10 Pain Score: 8  Pain Location: R knee Pain Descriptors / Indicators: Aching;Grimacing;Guarding Pain Intervention(s): Monitored during session;Premedicated before session     Hand Dominance     Extremity/Trunk Assessment Upper Extremity Assessment Upper Extremity Assessment: Overall WFL for tasks assessed   Lower Extremity Assessment Lower Extremity Assessment: Defer to PT evaluation        Communication Communication Communication: No difficulties   Cognition Arousal/Alertness: Awake/alert Behavior During Therapy: WFL for tasks assessed/performed Overall Cognitive Status: Within Functional Limits for tasks assessed  General Comments       Exercises      Shoulder Instructions       Home Living Family/patient expects to be discharged to:: Private residence Living Arrangements: Spouse/significant other Available Help at Discharge: Family;Available 24 hours/day Type of Home: Mobile home Home Access: Ramped entrance     Home Layout: One level     Bathroom Shower/Tub: Tub/shower unit Shower/tub characteristics: Curtain Biochemist, clinical: Standard     Home Equipment: Environmental consultant - 2 wheels;Electric scooter;Bedside commode          Prior Functioning/Environment Level of Independence: Independent with assistive device(s)        Comments: pt stated that she used her Hover Round mostly for mobility but was able to ambulate with a RW as well        OT Problem List: Decreased strength;Decreased range of motion;Impaired balance (sitting and/or standing);Decreased knowledge of use of DME or AE;Obesity;Pain   OT Treatment/Interventions: Self-care/ADL training;Energy conservation;DME and/or AE instruction;Therapeutic activities;Patient/family education;Balance training    OT Goals(Current goals can be found in the care plan section) Acute Rehab OT Goals Patient Stated Goal: return home today OT Goal Formulation: With patient/family Time For Goal Achievement: 09/19/16 Potential to Achieve Goals: Good ADL Goals Pt Will Perform Lower Body Bathing: with supervision;with adaptive equipment;sit to/from stand Pt Will Perform Lower Body Dressing: with supervision;with adaptive equipment;sit to/from stand Pt Will Perform Tub/Shower Transfer: Tub transfer;with supervision;ambulating;shower seat;rolling walker  OT Frequency: Min 2X/week   Barriers to D/C:            Co-evaluation              End of Session Nurse Communication: Mobility status  Activity Tolerance: Patient tolerated treatment well Patient left: in bed;with call bell/phone within reach;with family/visitor present   Time: [id number] OT Time Calculation (min): 12 min Charges:  OT General  Charges $OT Visit: 1 Procedure OT Evaluation $OT Eval Moderate Complexity: 1 Procedure G-Codes:     Binnie Kand M.S., OTR/L Pager: [phone]  09/05/2016, 11:28 AM

## 2016-09-05 NOTE — Discharge Instructions (Signed)
Information on my medicine - ELIQUIS (apixaban)  This medication education was reviewed with me or my healthcare representative as part of my discharge preparation.  The pharmacist that spoke with me during my hospital stay was:  Horatio Pel, RPH  Why was Eliquis prescribed for you? Eliquis was prescribed for you to reduce the risk of blood clots forming after orthopedic surgery.    What do You need to know about Eliquis? Take your Eliquis TWICE DAILY - one tablet in the morning and one tablet in the evening with or without food.  It would be best to take the dose about the same time each day.  If you have difficulty swallowing the tablet whole please discuss with your pharmacist how to take the medication safely.  Take Eliquis exactly as prescribed by your doctor and DO NOT stop taking Eliquis without talking to the doctor who prescribed the medication.  Stopping without other medication to take the place of Eliquis may increase your risk of developing a clot.  After discharge, you should have regular check-up appointments with your healthcare provider that is prescribing your Eliquis.  What do you do if you miss a dose? If a dose of ELIQUIS is not taken at the scheduled time, take it as soon as possible on the same day and twice-daily administration should be resumed.  The dose should not be doubled to make up for a missed dose.  Do not take more than one tablet of ELIQUIS at the same time.  Important Safety Information A possible side effect of Eliquis is bleeding. You should call your healthcare provider right away if you experience any of the following: ? Bleeding from an injury or your nose that does not stop. ? Unusual colored urine (red or dark brown) or unusual colored stools (red or black). ? Unusual bruising for unknown reasons. ? A serious fall or if you hit your head (even if there is no bleeding).  Some medicines may interact with Eliquis and might increase  your risk of bleeding or clotting while on Eliquis. To help avoid this, consult your healthcare provider or pharmacist prior to using any new prescription or non-prescription medications, including herbals, vitamins, non-steroidal anti-inflammatory drugs (NSAIDs) and supplements.  This website has more information on Eliquis (apixaban): http://www.eliquis.com/eliquis/home  INSTRUCTIONS AFTER JOINT REPLACEMENT   o Remove items at home which could result in a fall. This includes throw rugs or furniture in walking pathways o ICE to the affected joint every three hours while awake for 30 minutes at a time, for at least the first 3-5 days, and then as needed for pain and swelling.  Continue to use ice for pain and swelling. You may notice swelling that will progress down to the foot and ankle.  This is normal after surgery.  Elevate your leg when you are not up walking on it.   o Continue to use the breathing machine you got in the hospital (incentive spirometer) which will help keep your temperature down.  It is common for your temperature to cycle up and down following surgery, especially at night when you are not up moving around and exerting yourself.  The breathing machine keeps your lungs expanded and your temperature down.   DIET:  As you were doing prior to hospitalization, we recommend a well-balanced diet.  DRESSING / WOUND CARE / SHOWERING  You may change your dressing 3-5 days after surgery.  Then change the dressing every day with sterile gauze.  Please use  good hand washing techniques before changing the dressing.  Do not use any lotions or creams on the incision until instructed by your surgeon.  ACTIVITY  o Increase activity slowly as tolerated, but follow the weight bearing instructions below.   o No driving for 6 weeks or until further direction given by your physician.  You cannot drive while taking narcotics.  o No lifting or carrying greater than 10 lbs. until further directed  by your surgeon. o Avoid periods of inactivity such as sitting longer than an hour when not asleep. This helps prevent blood clots.  o You may return to work once you are authorized by your doctor.     WEIGHT BEARING   Weight bearing as tolerated with assist device (walker, cane, etc) as directed, use it as long as suggested by your surgeon or therapist, typically at least 4-6 weeks.   EXERCISES  Results after joint replacement surgery are often greatly improved when you follow the exercise, range of motion and muscle strengthening exercises prescribed by your doctor. Safety measures are also important to protect the joint from further injury. Any time any of these exercises cause you to have increased pain or swelling, decrease what you are doing until you are comfortable again and then slowly increase them. If you have problems or questions, call your caregiver or physical therapist for advice.   Rehabilitation is important following a joint replacement. After just a few days of immobilization, the muscles of the leg can become weakened and shrink (atrophy).  These exercises are designed to build up the tone and strength of the thigh and leg muscles and to improve motion. Often times heat used for twenty to thirty minutes before working out will loosen up your tissues and help with improving the range of motion but do not use heat for the first two weeks following surgery (sometimes heat can increase post-operative swelling).   These exercises can be done on a training (exercise) mat, on the floor, on a table or on a bed. Use whatever works the best and is most comfortable for you.    Use music or television while you are exercising so that the exercises are a pleasant break in your day. This will make your life better with the exercises acting as a break in your routine that you can look forward to.   Perform all exercises about fifteen times, three times per day or as directed.  You should  exercise both the operative leg and the other leg as well.  Exercises include:    Quad Sets - Tighten up the muscle on the front of the thigh (Quad) and hold for 5-10 seconds.    Straight Leg Raises - With your knee straight (if you were given a brace, keep it on), lift the leg to 60 degrees, hold for 3 seconds, and slowly lower the leg.  Perform this exercise against resistance later as your leg gets stronger.   Leg Slides: Lying on your back, slowly slide your foot toward your buttocks, bending your knee up off the floor (only go as far as is comfortable). Then slowly slide your foot back down until your leg is flat on the floor again.   Angel Wings: Lying on your back spread your legs to the side as far apart as you can without causing discomfort.   Hamstring Strength:  Lying on your back, push your heel against the floor with your leg straight by tightening up the muscles of your buttocks.  Repeat, but this time bend your knee to a comfortable angle, and push your heel against the floor.  You may put a pillow under the heel to make it more comfortable if necessary.   A rehabilitation program following joint replacement surgery can speed recovery and prevent re-injury in the future due to weakened muscles. Contact your doctor or a physical therapist for more information on knee rehabilitation.    CONSTIPATION  Constipation is defined medically as fewer than three stools per week and severe constipation as less than one stool per week.  Even if you have a regular bowel pattern at home, your normal regimen is likely to be disrupted due to multiple reasons following surgery.  Combination of anesthesia, postoperative narcotics, change in appetite and fluid intake all can affect your bowels.   YOU MUST use at least one of the following options; they are listed in order of increasing strength to get the job done.  They are all available over the counter, and you may need to use some, POSSIBLY even  all of these options:    Drink plenty of fluids (prune juice may be helpful) and high fiber foods Colace 100 mg by mouth twice a day  Senokot for constipation as directed and as needed Dulcolax (bisacodyl), take with full glass of water  Miralax (polyethylene glycol) once or twice a day as needed.  If you have tried all these things and are unable to have a bowel movement in the first 3-4 days after surgery call either your surgeon or your primary doctor.    If you experience loose stools or diarrhea, hold the medications until you stool forms back up.  If your symptoms do not get better within 1 week or if they get worse, check with your doctor.  If you experience "the worst abdominal pain ever" or develop nausea or vomiting, please contact the office immediately for further recommendations for treatment.   ITCHING:  If you experience itching with your medications, try taking only a single pain pill, or even half a pain pill at a time.  You can also use Benadryl over the counter for itching or also to help with sleep.   TED HOSE STOCKINGS:  Use stockings on both legs until for at least 2 weeks or as directed by physician office. They may be removed at night for sleeping.  MEDICATIONS:  See your medication summary on the After Visit Summary that nursing will review with you.  You may have some home medications which will be placed on hold until you complete the course of blood thinner medication.  It is important for you to complete the blood thinner medication as prescribed.  PRECAUTIONS:  If you experience chest pain or shortness of breath - call 911 immediately for transfer to the hospital emergency department.   If you develop a fever greater that 101 F, purulent drainage from wound, increased redness or drainage from wound, foul odor from the wound/dressing, or calf pain - CONTACT YOUR SURGEON.                                                   FOLLOW-UP APPOINTMENTS:  If you do not  already have a post-op appointment, please call the office for an appointment to be seen by your surgeon.  Guidelines for how soon to be seen are  listed in your After Visit Summary, but are typically between 1-4 weeks after surgery.  OTHER INSTRUCTIONS:   Knee Replacement:  Do not place pillow under knee, focus on keeping the knee straight while resting. CPM instructions: 0-90 degrees, 2 hours in the morning, 2 hours in the afternoon, and 2 hours in the evening. Place foam block, curve side up under heel at all times except when in CPM or when walking.  DO NOT modify, tear, cut, or change the foam block in any way.  MAKE SURE YOU:   Understand these instructions.   Get help right away if you are not doing well or get worse.    Thank you for letting us be a part of your medical care team.  It is a privilege we respect greatly.  We hope these instructions will help you stay on track for a fast and full recovery!

## 2016-09-05 NOTE — Evaluation (Signed)
Physical Therapy Evaluation Patient Details Name: Chelsea Ramsey MRN: [id number] DOB: June 26, 1966 Today's Date: 09/05/2016   History of Present Illness  Pt is a 50 y/o female s/p R TKA. PMH including but not limited CHF, COPD, CAD, and pulmonary HTN.  Clinical Impression  Pt presented supine in bed with HOB elevated, awake and willing to participate in therapy session. Prior to admission, pt stated that she ambulated with use of RW and used her Hover Round for longer distances. Pt moving very well during evaluation, requiring only min guard for safety and functional mobility. Pt would continue to benefit from skilled physical therapy services at this time while admitted and after d/c to address her below listed limitations in order to improve her overall safety and independence with functional mobility.     Follow Up Recommendations Home health PT;Supervision for mobility/OOB    Equipment Recommendations  Other (comment) (shower seat)    Recommendations for Other Services       Precautions / Restrictions Precautions Precautions: Knee;Fall Precaution Booklet Issued: Yes (comment) Restrictions Weight Bearing Restrictions: Yes RLE Weight Bearing: Weight bearing as tolerated      Mobility  Bed Mobility Overal bed mobility: Needs Assistance Bed Mobility: Supine to Sit     Supine to sit: Supervision;HOB elevated     General bed mobility comments: pt required increased time and use of bed rails  Transfers Overall transfer level: Needs assistance Equipment used: Rolling walker (2 wheeled) Transfers: Sit to/from Stand Sit to Stand: Min guard         General transfer comment: pt required increased time and VC'ing for bilateral hand placement  Ambulation/Gait Ambulation/Gait assistance: Min guard Ambulation Distance (Feet): 100 Feet Assistive device: Rolling walker (2 wheeled) Gait Pattern/deviations: Step-through pattern;Decreased step length - left;Decreased stance  time - right;Decreased weight shift to right Gait velocity: decreased Gait velocity interpretation: Below normal speed for age/gender General Gait Details: pt demonstrated good, safe technique with RW  Stairs            Wheelchair Mobility    Modified Rankin (Stroke Patients Only)       Balance Overall balance assessment: Needs assistance Sitting-balance support: Feet supported;No upper extremity supported Sitting balance-Leahy Scale: Good     Standing balance support: During functional activity;Bilateral upper extremity supported Standing balance-Leahy Scale: Poor Standing balance comment: pt reliant on bilateral UEs on RW                             Pertinent Vitals/Pain Pain Assessment: 0-10 Pain Score: 7  Pain Location: R knee Pain Descriptors / Indicators: Sore;Guarding;Grimacing Pain Intervention(s): Monitored during session;Repositioned    Home Living Family/patient expects to be discharged to:: Private residence Living Arrangements: Spouse/significant other Available Help at Discharge: Family;Available 24 hours/day Type of Home: Mobile home Home Access: Ramped entrance     Home Layout: One level Home Equipment: [place] - 2 wheels;Electric scooter;Bedside commode      Prior Function Level of Independence: Independent with assistive device(s)         Comments: pt stated that she used her Hover Round mostly for mobility but was able to ambulate with a RW as well     Hand Dominance        Extremity/Trunk Assessment   Upper Extremity Assessment: Defer to OT evaluation           Lower Extremity Assessment: RLE deficits/detail RLE Deficits / Details: pt with decreased strength and  ROM limitations secondary to post-op.       Communication   Communication: No difficulties  Cognition Arousal/Alertness: Awake/alert Behavior During Therapy: WFL for tasks assessed/performed Overall Cognitive Status: Within Functional Limits for  tasks assessed                      General Comments      Exercises Total Joint Exercises Ankle Circles/Pumps: AROM;Both;10 reps;Seated Quad Sets: AROM;Strengthening;Right;10 reps;Seated Long Arc Quad: AAROM;Right;10 reps;Seated Knee Flexion: AAROM;Right;10 reps;Seated Goniometric ROM: Flexion = 78 degrees; Extension = lacking 15 degrees to neutral; measured in sitting   Assessment/Plan    PT Assessment Patient needs continued PT services  PT Problem List Decreased strength;Decreased range of motion;Decreased activity tolerance;Decreased balance;Decreased mobility;Decreased coordination;Pain          PT Treatment Interventions DME instruction;Gait training;Stair training;Functional mobility training;Therapeutic activities;Therapeutic exercise;Balance training;Neuromuscular re-education;Patient/family education    PT Goals (Current goals can be found in the Care Plan section)  Acute Rehab PT Goals Patient Stated Goal: return home today PT Goal Formulation: With patient Time For Goal Achievement: 09/12/16 Potential to Achieve Goals: Good    Frequency 7X/week   Barriers to discharge        Co-evaluation               End of Session Equipment Utilized During Treatment: Gait belt Activity Tolerance: Patient tolerated treatment well Patient left: in chair;with call bell/phone within reach Nurse Communication: Mobility status         Time: [id number] PT Time Calculation (min) (ACUTE ONLY): 23 min   Charges:   PT Evaluation $PT Eval Moderate Complexity: 1 Procedure     PT G CodesClearnce Sorrel Sneijder Bernards 09/05/2016, 10:42 AM Sherie Don, PT, DPT [phone]

## 2016-09-05 NOTE — Discharge Summary (Signed)
Discharge Summary  Patient ID: Chelsea Ramsey MRN: [id number] DOB/AGE: [birth year] 50 y.o.  Admit date: 09/04/2016 Discharge date: 09/05/2016  Admission Diagnoses:  <principal problem not specified>  Discharge Diagnoses:  Active Problems:   Primary localized osteoarthritis of right knee   Past Medical History:  Diagnosis Date  . Allergy    seasonal  . Anxiety   . Arthritis    Right Knee  . Asthma   . CHF (congestive heart failure) ([place])   . COPD (chronic obstructive pulmonary disease) ([place])   . Coronary artery disease    Leaky heart valve  . Fibromyalgia   . GERD (gastroesophageal reflux disease)   . Hypertension   . Pneumonia   . PUD (peptic ulcer disease)   . Pulmonary HTN   . Rheumatic fever/heart disease   . Sleep apnea     Surgeries: Procedure(s): TOTAL KNEE ARTHROPLASTY; with lateral release on 09/04/2016   Consultants (if any):   Discharged Condition: Improved  Hospital Course: Chelsea Ramsey is an 50 y.o. female who was admitted 09/04/2016 with a diagnosis of <principal problem not specified> and went to the operating room on 09/04/2016 and underwent the above named procedures.    She was given perioperative antibiotics:  Anti-infectives    Start     Dose/Rate Route Frequency Ordered Stop   09/04/16 1700  ceFAZolin (ANCEF) IVPB 2g/100 mL premix     2 g 200 mL/hr over 30 Minutes Intravenous Every 6 hours 09/04/16 1449 09/05/16 0047   09/04/16 0915  ceFAZolin (ANCEF) 3 g in dextrose 5 % 50 mL IVPB     3 g 130 mL/hr over 30 Minutes Intravenous To Surgery 09/04/16 0859 09/04/16 1102    .  She was given sequential compression devices, early ambulation, and eliquis for DVT prophylaxis.  She benefited maximally from the hospital stay and there were no complications.    Recent vital signs:  Vitals:   09/05/16 0027 09/05/16 0452  BP: 106/69 (!) 98/55  Pulse: 94 95  Resp:    Temp: 97.6 F (36.4 C) 97.8 F (36.6 C)    Recent laboratory studies:   Lab Results  Component Value Date   HGB 9.9 (L) 09/05/2016   HGB 12.8 08/24/2016   HGB 13.3 02/20/2015   Lab Results  Component Value Date   WBC 13.4 (H) 09/05/2016   PLT 225 09/05/2016   Lab Results  Component Value Date   INR 1.05 08/24/2016   Lab Results  Component Value Date   NA 141 09/05/2016   K 3.9 09/05/2016   CL 102 09/05/2016   CO2 28 09/05/2016   BUN 8 09/05/2016   CREATININE 0.66 09/05/2016   GLUCOSE 116 (H) 09/05/2016    Discharge Medications:     Medication List    STOP taking these medications   LIDODERM 5 % Generic drug:  lidocaine     TAKE these medications   albuterol 108 (90 Base) MCG/ACT inhaler Commonly known as:  PROVENTIL HFA;VENTOLIN HFA Inhale 2 puffs into the lungs every 6 (six) hours as needed for shortness of breath.   apixaban 2.5 MG Tabs tablet Commonly known as:  ELIQUIS Take 1 tablet (2.5 mg total) by mouth 2 (two) times daily.   ARTIFICIAL TEAR OP Apply 2 drops to eye daily as needed (dry eyes).   benzonatate 100 MG capsule Commonly known as:  TESSALON PERLES Take 1 capsule (100 mg total) by mouth 3 (three) times daily as needed for cough.   carvedilol  3.125 MG tablet Commonly known as:  COREG Take 3.125 mg by mouth.   diphenhydramine-acetaminophen 25-500 MG Tabs tablet Commonly known as:  TYLENOL PM Take 2 tablets by mouth at bedtime as needed (headaches).   furosemide 80 MG tablet Commonly known as:  LASIX Take 80 mg by mouth.   gabapentin 300 MG capsule Commonly known as:  NEURONTIN Take 600 mg by mouth.   ipratropium-albuterol 0.5-2.5 (3) MG/3ML Soln Commonly known as:  DUONEB Inhale 3 mL by nebulization every six (6) hours as needed.   losartan 25 MG tablet Commonly known as:  COZAAR Take 25 mg by mouth daily.   omeprazole 20 MG capsule Commonly known as:  PRILOSEC Take 40 mg by mouth 2 (two) times daily.   oxyCODONE-acetaminophen 5-325 MG tablet Commonly known as:  ROXICET Take 1-2 tablets by  mouth every 4 (four) hours as needed for severe pain (max 8 tabs in 24hrs.).   PARoxetine 10 MG tablet Commonly known as:  PAXIL Take 10 mg by mouth daily.   predniSONE 10 MG tablet Commonly known as:  DELTASONE Taper: 6,5,4,3,2,1   RA NICOTINE 7 mg/24hr patch Generic drug:  nicotine Place onto the skin.   SYMBICORT 160-4.5 MCG/ACT inhaler Generic drug:  budesonide-formoterol Inhale 2 puffs into the lungs 2 (two) times daily.   TRINTELLIX 10 MG Tabs Generic drug:  vortioxetine HBr Take 10 mg by mouth daily as needed (mood swings /anxiety).   TYLENOL 325 MG tablet Generic drug:  acetaminophen Take 650 mg by mouth.   zolpidem 10 MG tablet Commonly known as:  AMBIEN Take 10 mg by mouth at bedtime.       Diagnostic Studies: No results found.  Disposition: 01-Home or Self Care    Follow-up Information    CAFFREY JR,W D, MD. Schedule an appointment as soon as possible for a visit in 2 week(s).   Specialty:  Orthopedic Surgery Contact information: [place] [address] [phone]        [place] .   Why:  home health agency and shower stool Contact information: [address] [place] [address] [phone]            Signed: Prudencio Burly III PA-C 09/05/2016, 4:43 PM

## 2016-09-05 NOTE — Plan of Care (Signed)
Problem: Safety: Goal: Ability to remain free from injury will improve Outcome: Progressing No safety issues noted  Problem: Pain Management: Goal: Pain level will decrease with appropriate interventions Outcome: Progressing Medicated twice for pain with moderate relief

## 2016-09-05 NOTE — Op Note (Signed)
NAMEDONNELLA, LANTIGUA NO.:  [id number]  MEDICAL RECORD NO.:  [id number]  LOCATION:  5N12C                        FACILITY:  [facility]  PHYSICIAN:  Lockie Pares, M.D.    DATE OF BIRTH:  21-Sep-1966  DATE OF PROCEDURE:  09/04/2016 DATE OF DISCHARGE:                              OPERATIVE REPORT   PREOPERATIVE DIAGNOSIS:  Severe osteoarthritis, right knee with valgus deformity.  POSTOPERATIVE DIAGNOSIS:  Severe osteoarthritis, right knee with valgus deformity.  OPERATION: 1. Right total knee replacement (Sigma cemented knee, size 4 femur,     tibia, 15 mm bearing with 35 mm all-poly patella. 2. Open lateral release for the right knee.  ANESTHESIA:  General with adductor block.  TOURNIQUET TIME:  70 minutes.  DESCRIPTION OF PROCEDURE:  In supine position, inflation of a thigh tourniquet to 375.  Exsanguination of leg.  Straight skin incision with medial parapatellar approach to the knee made.  Moderately severe valgus deformity was noted.  We cut 11 mm with 5 degree valgus cut off the distal femur.  This was followed by cutting about 3-4 mm below the most diseased lateral compartment in the valgus knee.  Extension gap initially was measured 12.5, but due to ligamentous laxity and releases eventually ended up at 15 mm.  We then sized the femur to be a size 4. We placed all in 1 cutting block in the appropriate degree of external rotation.  Cut the anterior-posterior chamfers as well as the box cut for the femur.  Yolonda Kida was cut for the tibia.  Size 4 tibial keel base plate was used.  We then placed the trial femur and tibia.  Obtained full extension.  She had a mild flexion contracture preoperatively with good resolution of valgus deformity.  However, on checking the patella, there was still some tendency for lateral tracking and subluxation of the patella, therefore, we afforded a limited lateral release of the patella intra-articularly.  This balanced the  patellofemoral mechanism nicely.  We then cut the patella leaving about 16-17 mm of native patella for 35 mm trial.  Again resolution of the valgus deformity noted, full extension, good stability mediolaterally, and no tendency for bearing spin out, and negligible anterior drawer was noted.  Trial components were removed.  We then inserted the components with tibia followed by femur and patella.  Prior to this, we mixed combination of Exparel and Marcaine on the back table and infiltrated the posterior capsule, all capsular structures and subcutaneous tissues in the knee. Cement was allowed to harden.  We removed the trial bearing after the cement had hardened.  There was excess cement in the posterior aspect of the knee.  It was removed.  The tourniquet was released.  There was no excessive bleeding, and the final bearing was placed.  All parameters again deemed to be acceptable.  Closure was affected with #1 Ethibond to capsule, 0 and 2-0 Vicryl with skin clips.  Lightly compressive sterile dressing and knee immobilizer applied, taken to recovery in stable condition.     Lockie Pares, M.D.     WDC/MEDQ  D:  09/04/2016  T:  09/05/2016  Job:  [id number]

## 2016-09-05 NOTE — Care Management Note (Signed)
Case Management Note  Patient Details  Name: CRYSTALINA STODGHILL MRN: [id number] Date of Birth: 1966/09/30  Subjective/Objective:                  R TKA Action/Plan: Discharge planning Expected Discharge Date:  09/05/16               Expected Discharge Plan:  [place]  In-House Referral:     Discharge planning Services  CM Consult  Post Acute Care Choice:  Home Health Choice offered to:  Patient  DME Arranged:  Shower stool DME Agency:  [place]:  PT [facility] Agency:  [address]  Status of Service:  Completed, signed off  If discussed at [place] of Stay Meetings, dates discussed:    Additional Comments: CM met with pt in room to offer choice of home health agency.  Pt chooses [facility] to render HHPT.  Referral called to [facility] rep, Jermaine.  CM notified [facility] rep, Reggie to please deliver the shower stool to room so pt can discharge home today. No other CM needs were communicated. Dellie Catholic, RN 09/05/2016, 11:38 AM

## 2016-09-05 NOTE — Progress Notes (Signed)
Discharge home. HHneeds addressed by CM. Home discharge instruction given, no question verbalized.

## 2016-09-07 ENCOUNTER — Encounter (HOSPITAL_COMMUNITY): Payer: Self-pay | Admitting: Orthopedic Surgery

## 2016-09-18 ENCOUNTER — Emergency Department
Admission: EM | Admit: 2016-09-18 | Discharge: 2016-09-18 | Disposition: A | Discharge status: Home / Self Care | Payer: Medicaid Other | Attending: Emergency Medicine | Admitting: Emergency Medicine

## 2016-09-18 ENCOUNTER — Emergency Department: Payer: Medicaid Other

## 2016-09-18 ENCOUNTER — Encounter: Payer: Self-pay | Admitting: Emergency Medicine

## 2016-09-18 DIAGNOSIS — I110 Hypertensive heart disease with heart failure: Secondary | ICD-10-CM | POA: Diagnosis not present

## 2016-09-18 DIAGNOSIS — X501XXA Overexertion from prolonged static or awkward postures, initial encounter: Secondary | ICD-10-CM | POA: Insufficient documentation

## 2016-09-18 DIAGNOSIS — I509 Heart failure, unspecified: Secondary | ICD-10-CM | POA: Insufficient documentation

## 2016-09-18 DIAGNOSIS — J45909 Unspecified asthma, uncomplicated: Secondary | ICD-10-CM | POA: Insufficient documentation

## 2016-09-18 DIAGNOSIS — Z79899 Other long term (current) drug therapy: Secondary | ICD-10-CM | POA: Insufficient documentation

## 2016-09-18 DIAGNOSIS — Y929 Unspecified place or not applicable: Secondary | ICD-10-CM | POA: Diagnosis not present

## 2016-09-18 DIAGNOSIS — J449 Chronic obstructive pulmonary disease, unspecified: Secondary | ICD-10-CM | POA: Insufficient documentation

## 2016-09-18 DIAGNOSIS — F17210 Nicotine dependence, cigarettes, uncomplicated: Secondary | ICD-10-CM | POA: Insufficient documentation

## 2016-09-18 DIAGNOSIS — I2510 Atherosclerotic heart disease of native coronary artery without angina pectoris: Secondary | ICD-10-CM | POA: Diagnosis not present

## 2016-09-18 DIAGNOSIS — Y9301 Activity, walking, marching and hiking: Secondary | ICD-10-CM | POA: Insufficient documentation

## 2016-09-18 DIAGNOSIS — S96911A Strain of unspecified muscle and tendon at ankle and foot level, right foot, initial encounter: Secondary | ICD-10-CM | POA: Insufficient documentation

## 2016-09-18 DIAGNOSIS — Z96651 Presence of right artificial knee joint: Secondary | ICD-10-CM | POA: Insufficient documentation

## 2016-09-18 DIAGNOSIS — S99911A Unspecified injury of right ankle, initial encounter: Secondary | ICD-10-CM | POA: Diagnosis present

## 2016-09-18 DIAGNOSIS — M7989 Other specified soft tissue disorders: Secondary | ICD-10-CM

## 2016-09-18 DIAGNOSIS — Y999 Unspecified external cause status: Secondary | ICD-10-CM | POA: Insufficient documentation

## 2016-09-18 DIAGNOSIS — M79661 Pain in right lower leg: Secondary | ICD-10-CM

## 2016-09-18 MED ORDER — ORPHENADRINE CITRATE ER 100 MG PO TB12: 100.0000 mg | ORAL_TABLET | Freq: Two times a day (BID) | ORAL | 0 refills | Status: DC | PRN

## 2016-09-18 MED ORDER — PREDNISONE 10 MG PO TABS: 10.0000 mg | ORAL_TABLET | Freq: Two times a day (BID) | ORAL | 0 refills | Status: DC

## 2016-09-18 NOTE — ED Provider Notes (Signed)
[facility] Emergency Department Provider Note ____________________________________________  Time seen: 34  I have reviewed the triage vital signs and the nursing notes.  HISTORY  Chief Complaint  Knee Pain  HPI Chelsea Ramsey is a 50 y.o. female presents to the ED for evaluation of pain to the right lower leg and the right ankle after a twist and near fall last night. The patient reports significant history that includes being 2 weeks status post total knee arthroplasty on the right. She was evaluated yesterday by Dr. French Ana, had staples removed and reports a normal postop x-ray. She describes last night that she was walking with socked feet, when she felt a "pop" to herlateral right calf. It was this muscle injury that caused her to rolled the ankle. She avoided falling by catching the counter. She reports pain now with bearing weight through the right ankle. She denies any previous history of ankle problems. She also reports pain in the lateral aspect of the calf as well.  Past Medical History:  Diagnosis Date  . Allergy    seasonal  . Anxiety   . Arthritis    Right Knee  . Asthma   . CHF (congestive heart failure) ([place])   . COPD (chronic obstructive pulmonary disease) ([place])   . Coronary artery disease    Leaky heart valve  . Fibromyalgia   . GERD (gastroesophageal reflux disease)   . Hypertension   . Pneumonia   . PUD (peptic ulcer disease)   . Pulmonary HTN   . Rheumatic fever/heart disease   . Sleep apnea     Patient Active Problem List   Diagnosis Date Noted  . Primary localized osteoarthritis of right knee 09/04/2016    Past Surgical History:  Procedure Laterality Date  . ADENOIDECTOMY    . MULTIPLE TOOTH EXTRACTIONS    . TONSILLECTOMY    . TOTAL KNEE ARTHROPLASTY Right 09/04/2016   Procedure: TOTAL KNEE ARTHROPLASTY; with lateral release;  Surgeon: Earlie Server, MD;  Location: [facility];  Service: Orthopedics;  Laterality: Right;     Prior to Admission medications   Medication Sig Start Date End Date Taking? Authorizing Provider  acetaminophen (TYLENOL) 325 MG tablet Take 650 mg by mouth. 07/10/15   Historical Provider, MD  albuterol (PROVENTIL HFA;VENTOLIN HFA) 108 (90 Base) MCG/ACT inhaler Inhale 2 puffs into the lungs every 6 (six) hours as needed for shortness of breath. 02/26/16   Historical Provider, MD  apixaban (ELIQUIS) 2.5 MG TABS tablet Take 1 tablet (2.5 mg total) by mouth 2 (two) times daily. 09/04/16   Chriss Czar, PA-C  ARTIFICIAL TEAR OP Apply 2 drops to eye daily as needed (dry eyes).    Historical Provider, MD  benzonatate (TESSALON PERLES) 100 MG capsule Take 1 capsule (100 mg total) by mouth 3 (three) times daily as needed for cough. 12/10/15 12/09/16  Harvest Dark, PA-C  budesonide-formoterol (SYMBICORT) 160-4.5 MCG/ACT inhaler Inhale 2 puffs into the lungs 2 (two) times daily. 02/26/16   Historical Provider, MD  carvedilol (COREG) 3.125 MG tablet Take 3.125 mg by mouth. 02/12/15   Historical Provider, MD  diphenhydramine-acetaminophen (TYLENOL PM) 25-500 MG TABS tablet Take 2 tablets by mouth at bedtime as needed (headaches).    Historical Provider, MD  furosemide (LASIX) 80 MG tablet Take 80 mg by mouth. 07/31/15   Historical Provider, MD  gabapentin (NEURONTIN) 300 MG capsule Take 600 mg by mouth. 07/22/15   Historical Provider, MD  ipratropium-albuterol (DUONEB) 0.5-2.5 (3) MG/3ML SOLN Inhale  3 mL by nebulization every six (6) hours as needed. 02/26/16 02/25/17  Historical Provider, MD  losartan (COZAAR) 25 MG tablet Take 25 mg by mouth daily.    Historical Provider, MD  nicotine (RA NICOTINE) 7 mg/24hr patch Place onto the skin. 12/23/15   Historical Provider, MD  omeprazole (PRILOSEC) 20 MG capsule Take 40 mg by mouth 2 (two) times daily. 02/18/16   Historical Provider, MD  orphenadrine (NORFLEX) 100 MG tablet Take 1 tablet (100 mg total) by mouth 2 (two) times daily as needed for muscle spasms.  09/18/16   Cipriano Millikan V Bacon Sante Biedermann, PA-C  oxyCODONE-acetaminophen (ROXICET) 5-325 MG tablet Take 1-2 tablets by mouth every 4 (four) hours as needed for severe pain (max 8 tabs in 24hrs.). 09/04/16   Chriss Czar, PA-C  PARoxetine (PAXIL) 10 MG tablet Take 10 mg by mouth daily.    Historical Provider, MD  predniSONE (DELTASONE) 10 MG tablet Take 1 tablet (10 mg total) by mouth 2 (two) times daily with a meal. 09/18/16   Davonne Baby V Bacon Niquan Charnley, PA-C  vortioxetine HBr (TRINTELLIX) 10 MG TABS Take 10 mg by mouth daily as needed (mood swings /anxiety).    Historical Provider, MD  zolpidem (AMBIEN) 10 MG tablet Take 10 mg by mouth at bedtime.    Historical Provider, MD    Allergies Aspirin; Flexeril [cyclobenzaprine]; Morphine and related; Trazamine [trazodone & diet manage prod]; Codeine; and Tramadol  Family History  Problem Relation Age of Onset  . COPD Mother   . Cancer Mother     Bone  . Asthma Mother   . Congestive Heart Failure Father     Social History Social History  Substance Use Topics  . Smoking status: Current Some Day Smoker    Types: Cigarettes  . Smokeless tobacco: Never Used  . Alcohol use 1.8 oz/week    3 Cans of beer per week     Comment: 16 oz per week    Review of Systems  Constitutional: Negative for fever. Cardiovascular: Negative for chest pain. Respiratory: Negative for shortness of breath. Gastrointestinal: Negative for abdominal pain, vomiting and diarrhea. Musculoskeletal: Negative for back pain. Right calf and ankle pain as above. Skin: Negative for rash. Neurological: Negative for headaches, focal weakness or numbness. ____________________________________________  PHYSICAL EXAM:  VITAL SIGNS: ED Triage Vitals  Enc Vitals Group     BP 09/18/16 1335 139/86     Pulse Rate 09/18/16 1335 99     Resp 09/18/16 1335 20     Temp 09/18/16 1335 98.8 F (37.1 C)     Temp Source 09/18/16 1335 Oral     SpO2 09/18/16 1335 96 %     Weight 09/18/16 1329  262 lb (118.8 kg)     Height 09/18/16 1329 5\' 5"  (1.651 m)     Head Circumference --      Peak Flow --      Pain Score 09/18/16 1329 10     Pain Loc --      Pain Edu? --      Excl. in [place]? --    Constitutional: Alert and oriented. Well appearing and in no distress. Head: Normocephalic and atraumatic. Cardiovascular: Normal rate, regular rhythm. Normal distal pulses. Respiratory: Normal respiratory effort. No wheezes/rales/rhonchi. Gastrointestinal: Soft and nontender. No distention. Musculoskeletal: Right knee with midline Steri-strips consistent with recent procedure. Normal knee exam. Right ankle without obvious deformity, edema, effusion or dislocation. Normal drawer sign. Moderate tenderness to palp along the lateral calf. Nontender with normal  range of motion in all extremities.  Neurologic: Normal speech and language. No gross focal neurologic deficits are appreciated. Skin:  Skin is warm, dry and intact. No rash noted. ___________________________________________   RADIOLOGY  Right Ankle IMPRESSION: No acute findings.  RLE Venous Doppler IMPRESSION: Negative for deep venous thrombosis in right lower extremity.  ____________________________________________  PROCEDURES  Ace wrap ____________________________________________  INITIAL IMPRESSION / ASSESSMENT AND PLAN / ED COURSE  Patient is 2 weeks s/p right TKR. She reports injury to her lateral right calf and subsequent ankle sprain. She is reassured by her negative studies today. She is discharged to follow-up with Dr. French Ana for ongoing symptoms.   Clinical Course   ____________________________________________  FINAL CLINICAL IMPRESSION(S) / ED DIAGNOSES  Final diagnoses:  Right calf pain  Right ankle strain, initial encounter      Melvenia Needles, PA-C 09/18/16 1659    Lisa Roca, MD 09/20/16 1017

## 2016-09-18 NOTE — Discharge Instructions (Signed)
Take the prescription med as directed. Rest, ice, and elevate the leg when seated. Follow-up with Dr. French Ana for ongoing symptoms.

## 2016-09-18 NOTE — ED Triage Notes (Addendum)
Right knee replacement 2 weeks ago. Check up yesterday and staples removed. Pt walking without brace today and felt pop in right knee. C/o right knee pain and burning. Also c/o ankle pain

## 2016-09-28 ENCOUNTER — Ambulatory Visit: Payer: Medicaid Other | Attending: Orthopedic Surgery | Admitting: Physical Therapy

## 2016-10-07 ENCOUNTER — Ambulatory Visit: Payer: Medicaid Other

## 2016-10-08 ENCOUNTER — Encounter: Payer: Medicaid Other | Admitting: Physical Therapy

## 2016-10-14 ENCOUNTER — Ambulatory Visit: Payer: Medicaid Other

## 2016-10-19 ENCOUNTER — Ambulatory Visit: Payer: Medicaid Other | Admitting: Physical Therapy

## 2016-10-27 ENCOUNTER — Encounter: Payer: Medicaid Other | Admitting: Physical Therapy

## 2016-11-04 ENCOUNTER — Encounter: Payer: Medicaid Other | Admitting: Physical Therapy

## 2016-11-11 ENCOUNTER — Encounter: Payer: Medicaid Other | Admitting: Physical Therapy

## 2016-12-19 ENCOUNTER — Encounter: Payer: Self-pay | Admitting: Emergency Medicine

## 2016-12-19 ENCOUNTER — Emergency Department
Admission: EM | Admit: 2016-12-19 | Discharge: 2016-12-19 | Disposition: A | Discharge status: Home / Self Care | Payer: Medicaid Other | Attending: Emergency Medicine | Admitting: Emergency Medicine

## 2016-12-19 DIAGNOSIS — I2510 Atherosclerotic heart disease of native coronary artery without angina pectoris: Secondary | ICD-10-CM | POA: Insufficient documentation

## 2016-12-19 DIAGNOSIS — I509 Heart failure, unspecified: Secondary | ICD-10-CM | POA: Insufficient documentation

## 2016-12-19 DIAGNOSIS — F17210 Nicotine dependence, cigarettes, uncomplicated: Secondary | ICD-10-CM | POA: Diagnosis not present

## 2016-12-19 DIAGNOSIS — Z79899 Other long term (current) drug therapy: Secondary | ICD-10-CM | POA: Diagnosis not present

## 2016-12-19 DIAGNOSIS — N12 Tubulo-interstitial nephritis, not specified as acute or chronic: Secondary | ICD-10-CM | POA: Insufficient documentation

## 2016-12-19 DIAGNOSIS — J45909 Unspecified asthma, uncomplicated: Secondary | ICD-10-CM | POA: Insufficient documentation

## 2016-12-19 DIAGNOSIS — J449 Chronic obstructive pulmonary disease, unspecified: Secondary | ICD-10-CM | POA: Insufficient documentation

## 2016-12-19 DIAGNOSIS — I110 Hypertensive heart disease with heart failure: Secondary | ICD-10-CM | POA: Insufficient documentation

## 2016-12-19 DIAGNOSIS — R109 Unspecified abdominal pain: Secondary | ICD-10-CM | POA: Diagnosis present

## 2016-12-19 LAB — URINALYSIS, COMPLETE (UACMP) WITH MICROSCOPIC
BACTERIA UA: NONE SEEN
Bilirubin Urine: NEGATIVE
Glucose, UA: NEGATIVE mg/dL
HGB URINE DIPSTICK: NEGATIVE
Ketones, ur: NEGATIVE mg/dL
NITRITE: NEGATIVE
PROTEIN: NEGATIVE mg/dL
SPECIFIC GRAVITY, URINE: 1.006 (ref 1.005–1.030)
pH: 5 (ref 5.0–8.0)

## 2016-12-19 MED ORDER — CIPROFLOXACIN HCL 500 MG PO TABS: 500.0000 mg | ORAL_TABLET | Freq: Two times a day (BID) | ORAL | 0 refills | Status: AC

## 2016-12-19 MED ORDER — OXYCODONE-ACETAMINOPHEN 10-325 MG PO TABS: 1.0000 | ORAL_TABLET | Freq: Four times a day (QID) | ORAL | 0 refills | Status: AC | PRN

## 2016-12-19 MED ORDER — OXYCODONE-ACETAMINOPHEN 5-325 MG PO TABS
1.0000 | ORAL_TABLET | Freq: Once | ORAL | Status: AC
  Administered 2016-12-19: 1 via ORAL
  Filled 2016-12-19: qty 1

## 2016-12-19 NOTE — ED Notes (Signed)
Pt verbalized understanding of discharge instructions. NAD at this time. 

## 2016-12-19 NOTE — ED Provider Notes (Signed)
[facility] Emergency Department Provider Note  ____________________________________________   First MD Initiated Contact with Patient 12/19/16 1731     (approximate)  I have reviewed the triage vital signs and the nursing notes.   HISTORY  Chief Complaint Flank Pain  HPI Chelsea Ramsey is a 51 y.o. female, NAD, presents to the Emergency Department with a 1 day history of sharp, 8/10, right flank pain that radiates around the side and into her lower abdomen. The pain was so bad that it woke her from sleep last night. She also says her urine is "funny smelling," cloudy, and without blood. She denies dysuria, but regularly experiences urinary urgency and frequency, which she attributes to her lasix. Patient says she has had occasional episodes of urinary incontinence, and that she sometimes will wear a wet brief for a while. She has been nauseated, vomiting, and experienced occasional dizziness whenever the pain exacerbates. She denies chest pain, difficulty breathing, and loss of consciousness. She has her gallbladder and appendix.  Past Medical History:  Diagnosis Date  . Allergy    seasonal  . Anxiety   . Arthritis    Right Knee  . Asthma   . CHF (congestive heart failure) ([place])   . COPD (chronic obstructive pulmonary disease) ([place])   . Coronary artery disease    Leaky heart valve  . Fibromyalgia   . GERD (gastroesophageal reflux disease)   . Hypertension   . Pneumonia   . PUD (peptic ulcer disease)   . Pulmonary HTN   . Rheumatic fever/heart disease   . Sleep apnea     Patient Active Problem List   Diagnosis Date Noted  . Primary localized osteoarthritis of right knee 09/04/2016    Past Surgical History:  Procedure Laterality Date  . ADENOIDECTOMY    . MULTIPLE TOOTH EXTRACTIONS    . TONSILLECTOMY    . TOTAL KNEE ARTHROPLASTY Right 09/04/2016   Procedure: TOTAL KNEE ARTHROPLASTY; with lateral release;  Surgeon: Earlie Server, MD;   Location: [facility];  Service: Orthopedics;  Laterality: Right;    Prior to Admission medications   Medication Sig Start Date End Date Taking? Authorizing Provider  acetaminophen (TYLENOL) 325 MG tablet Take 650 mg by mouth. 07/10/15   Historical Provider, MD  albuterol (PROVENTIL HFA;VENTOLIN HFA) 108 (90 Base) MCG/ACT inhaler Inhale 2 puffs into the lungs every 6 (six) hours as needed for shortness of breath. 02/26/16   Historical Provider, MD  apixaban (ELIQUIS) 2.5 MG TABS tablet Take 1 tablet (2.5 mg total) by mouth 2 (two) times daily. 09/04/16   Chriss Czar, PA-C  ARTIFICIAL TEAR OP Apply 2 drops to eye daily as needed (dry eyes).    Historical Provider, MD  budesonide-formoterol (SYMBICORT) 160-4.5 MCG/ACT inhaler Inhale 2 puffs into the lungs 2 (two) times daily. 02/26/16   Historical Provider, MD  carvedilol (COREG) 3.125 MG tablet Take 3.125 mg by mouth. 02/12/15   Historical Provider, MD  ciprofloxacin (CIPRO) 500 MG tablet Take 1 tablet (500 mg total) by mouth 2 (two) times daily. 12/19/16 12/29/16  Pierce Crane Beers, PA-C  diphenhydramine-acetaminophen (TYLENOL PM) 25-500 MG TABS tablet Take 2 tablets by mouth at bedtime as needed (headaches).    Historical Provider, MD  furosemide (LASIX) 80 MG tablet Take 80 mg by mouth. 07/31/15   Historical Provider, MD  gabapentin (NEURONTIN) 300 MG capsule Take 600 mg by mouth. 07/22/15   Historical Provider, MD  ipratropium-albuterol (DUONEB) 0.5-2.5 (3) MG/3ML SOLN Inhale 3 mL by  nebulization every six (6) hours as needed. 02/26/16 02/25/17  Historical Provider, MD  losartan (COZAAR) 25 MG tablet Take 25 mg by mouth daily.    Historical Provider, MD  nicotine (RA NICOTINE) 7 mg/24hr patch Place onto the skin. 12/23/15   Historical Provider, MD  omeprazole (PRILOSEC) 20 MG capsule Take 40 mg by mouth 2 (two) times daily. 02/18/16   Historical Provider, MD  orphenadrine (NORFLEX) 100 MG tablet Take 1 tablet (100 mg total) by mouth 2 (two) times daily as  needed for muscle spasms. 09/18/16   Jenise V Bacon Menshew, PA-C  oxyCODONE-acetaminophen (PERCOCET) 10-325 MG tablet Take 1 tablet by mouth every 6 (six) hours as needed for pain. 12/19/16 12/19/17  Pierce Crane Beers, PA-C  PARoxetine (PAXIL) 10 MG tablet Take 10 mg by mouth daily.    Historical Provider, MD  predniSONE (DELTASONE) 10 MG tablet Take 1 tablet (10 mg total) by mouth 2 (two) times daily with a meal. 09/18/16   Jenise V Bacon Menshew, PA-C  vortioxetine HBr (TRINTELLIX) 10 MG TABS Take 10 mg by mouth daily as needed (mood swings /anxiety).    Historical Provider, MD  zolpidem (AMBIEN) 10 MG tablet Take 10 mg by mouth at bedtime.    Historical Provider, MD    Allergies Aspirin; Flexeril [cyclobenzaprine]; Morphine and related; Trazamine [trazodone & diet manage prod]; Codeine; and Tramadol  Family History  Problem Relation Age of Onset  . COPD Mother   . Cancer Mother     Bone  . Asthma Mother   . Congestive Heart Failure Father     Social History Social History  Substance Use Topics  . Smoking status: Current Some Day Smoker    Types: Cigarettes  . Smokeless tobacco: Never Used  . Alcohol use 1.8 oz/week    3 Cans of beer per week     Comment: 16 oz per week    Review of Systems Constitutional: No fever/chills. Cardiovascular: Denies chest pain. Denies ripping, stabbing back pain that radiates into abdomen. Denies leg swelling.  Respiratory: Denies shortness of breath, wheezing, orthopnea. Gastrointestinal: Positive right sided achey abdominal pain, nausea, vomiting.  No diarrhea.  No constipation. Genitourinary: Positive for right flank pain. Positive for "funny smelling" urine. Positive for urinary urgency, frequency, and recent urinary incontinence. Negative for dysuria. Musculoskeletal: Denies recent trauma.  Skin: Negative for rash. Neurological: Negative for headaches, focal weakness or numbness.  10-point ROS otherwise  negative.  ____________________________________________   PHYSICAL EXAM:  VITAL SIGNS: ED Triage Vitals [12/19/16 1525]  Enc Vitals Group     BP (!) 148/98     Pulse Rate (!) 110     Resp 18     Temp 99 F (37.2 C)     Temp Source Oral     SpO2 97 %     Weight 262 lb (118.8 kg)     Height 5\' 5"  (1.651 m)     Head Circumference      Peak Flow      Pain Score 10     Pain Loc      Pain Edu?      Excl. in [place]?    Constitutional: Alert and oriented. Well appearing and in no acute distress. Eyes: Conjunctivae are normal. PERRL. EOMI. Head: Atraumatic. Mouth/Throat: Mucous membranes are moist.  Oropharynx non-erythematous. Neck: No stridor.  No cervical spine tenderness to palpation. Cardiovascular: Normal rate, regular rhythm. Grossly normal heart sounds.  Good peripheral circulation. Bilateral pedal pulses 2+. Brisk capillary  refill in bilateral lower extremities. No pedal edema noted.  Respiratory: Normal respiratory effort.  No retractions. Lungs CTAB. Gastrointestinal: Positive right-sided CVA tenderness. Abdomen soft and tender to light and deep palpation in the upper and lower right quadrants. No distention. Bowel sounds normoactive in all 4 quadrants. No abdominal bruits. No pulsatile abdominal mass upon inspection. Negative Rovsing's and rebound tenderness.  Musculoskeletal: No musculoskeletal back pain to palpation. No lower extremity tenderness nor edema.  No joint effusions. Neurologic:  Normal speech and language. No gross focal neurologic deficits are appreciated. No gait instability. Skin:  Skin is warm, dry and intact. No rash noted. Psychiatric: Mood and affect are normal. Speech and behavior are normal.  ____________________________________________   LABS (all labs ordered are listed, but only abnormal results are displayed)  Labs Reviewed  URINALYSIS, COMPLETE (UACMP) WITH MICROSCOPIC - Abnormal; Notable for the following:       Result Value   Color, Urine  STRAW (*)    APPearance CLEAR (*)    Leukocytes, UA LARGE (*)    Squamous Epithelial / LPF 0-5 (*)    All other components within normal limits   ____________________________________________   PROCEDURES  Procedure(s) performed: None  Procedures  Critical Care performed: No  ____________________________________________   INITIAL IMPRESSION / ASSESSMENT AND PLAN / ED COURSE  Pertinent labs & imaging results that were available during my care of the patient were reviewed by me and considered in my medical decision making (see chart for details).  Lab results and clinical findings consistent with pyelonephritis. Discharged home with Rx for ciprofloxacin and percocet.       ____________________________________________   FINAL CLINICAL IMPRESSION(S) / ED DIAGNOSES  Final diagnoses:  Pyelonephritis      NEW MEDICATIONS STARTED DURING THIS VISIT:  Discharge Medication List as of 12/19/2016  5:58 PM    START taking these medications   Details  ciprofloxacin (CIPRO) 500 MG tablet Take 1 tablet (500 mg total) by mouth 2 (two) times daily., Starting Sat 12/19/2016, Until Tue 12/29/2016, Print    oxyCODONE-acetaminophen (PERCOCET) 10-325 MG tablet Take 1 tablet by mouth every 6 (six) hours as needed for pain., Starting Sat 12/19/2016, Until Sun 12/19/2017, Print         Note:  This document was prepared using Dragon voice recognition software and may include unintentional dictation errors.   Arlyss Repress, PA-C 12/19/16 [id number]    Lavonia Drafts, MD 12/21/16 [phone]

## 2016-12-19 NOTE — ED Triage Notes (Signed)
Pt reports right side flank pain that radiates to groin that began last night. Pt reports cloudy urine that has foul odor. Pt tearful in triage.

## 2016-12-25 DIAGNOSIS — G4700 Insomnia, unspecified: Secondary | ICD-10-CM | POA: Insufficient documentation

## 2017-05-10 ENCOUNTER — Other Ambulatory Visit: Payer: Self-pay | Admitting: Orthopedic Surgery

## 2017-05-10 DIAGNOSIS — M545 Low back pain: Secondary | ICD-10-CM

## 2017-05-20 ENCOUNTER — Ambulatory Visit: Payer: Medicaid Other

## 2017-05-20 ENCOUNTER — Encounter (INDEPENDENT_AMBULATORY_CARE_PROVIDER_SITE_OTHER): Payer: Medicaid Other | Admitting: Podiatry

## 2017-05-20 DIAGNOSIS — M722 Plantar fascial fibromatosis: Secondary | ICD-10-CM

## 2017-05-20 NOTE — Progress Notes (Signed)
This encounter was created in error - please disregard.

## 2017-05-24 ENCOUNTER — Other Ambulatory Visit: Payer: Medicaid Other

## 2017-06-05 ENCOUNTER — Ambulatory Visit
Admission: RE | Admit: 2017-06-05 | Discharge: 2017-06-05 | Disposition: A | Discharge status: Home / Self Care | Payer: Medicaid Other | Source: Ambulatory Visit | Attending: Orthopedic Surgery | Admitting: Orthopedic Surgery

## 2017-06-05 DIAGNOSIS — M545 Low back pain: Secondary | ICD-10-CM

## 2017-06-21 ENCOUNTER — Encounter: Payer: Self-pay | Admitting: Neurology

## 2017-07-06 ENCOUNTER — Other Ambulatory Visit: Payer: Self-pay | Admitting: *Deleted

## 2017-07-06 DIAGNOSIS — R200 Anesthesia of skin: Secondary | ICD-10-CM

## 2017-07-06 DIAGNOSIS — R202 Paresthesia of skin: Principal | ICD-10-CM

## 2017-07-06 NOTE — Progress Notes (Unsigned)
emg 

## 2017-07-27 ENCOUNTER — Ambulatory Visit (INDEPENDENT_AMBULATORY_CARE_PROVIDER_SITE_OTHER): Payer: Medicaid Other | Admitting: Neurology

## 2017-07-27 DIAGNOSIS — R200 Anesthesia of skin: Secondary | ICD-10-CM

## 2017-07-27 DIAGNOSIS — R202 Paresthesia of skin: Secondary | ICD-10-CM | POA: Diagnosis not present

## 2017-07-27 NOTE — Procedures (Signed)
[facility]  [place], [address]  [place], [place] [address] Tel: [phone] Fax:  [phone] Test Date:  07/27/2017  Patient: Chelsea Ramsey DOB: Nov 25, 1966 Physician: Narda Amber, DO  Sex: Female   Ref Phys:   ID#: [id number] Temp: 33.6C Technician:    Patient Complaints: This is a 51 year-old female referred for evaluation of low back pain and bilateral leg paresthesias.  NCV & EMG Findings: Extensive electrodiagnostic testing of the right lower extremity and additional studies of the left shows:  1. Bilateral sural and superficial peroneal sensory responses are within normal limits. 2. Left peroneal motor response is mildly reduced, and is normal at the tibialis anterior. Bilateral tibial and right peroneal motor responses are within normal limits. 3. Right tibial H reflex study is within normal limits.  4. There is no evidence of active or chronic motor axon loss changes affecting any of the tested muscles. Motor unit configuration and recruitment pattern is within normal limits.  Impression: This is a normal study of the lower extremities. In particular, there is no evidence of a sensorimotor polyneuropathy or lumbosacral radiculopathy.   ___________________________ Narda Amber, DO    Nerve Conduction Studies Anti Sensory Summary Table   Site NR Peak (ms) Norm Peak (ms) P-T Amp (V) Norm P-T Amp  Left Sup Peroneal Anti Sensory (Ant Lat Mall)  12 cm    4.4 <4.6 5.5 >4  Right Sup Peroneal Anti Sensory (Ant Lat Mall)  12 cm    2.3 <4.6 8.6 >4  Left Sural Anti Sensory (Lat Mall)  Calf    2.5 <4.6 14.0 >4  Right Sural Anti Sensory (Lat Mall)  Calf    2.8 <4.6 10.4 >4   Motor Summary Table   Site NR Onset (ms) Norm Onset (ms) O-P Amp (mV) Norm O-P Amp Site1 Site2 Delta-0 (ms) Dist (cm) Vel (m/s) Norm Vel (m/s)  Left Peroneal Motor (Ext Dig Brev)  Ankle    2.5 <6.0 2.2 >2.5 B Fib Ankle 7.9 38.0 48 >40  B Fib    10.4  1.9  Poplt B Fib 1.6 9.0 56  >40  Poplt    12.0  1.8         Right Peroneal Motor (Ext Dig Brev)  Ankle    3.0 <6.0 4.5 >2.5 B Fib Ankle 7.0 38.0 54 >40  B Fib    10.0  4.2  Poplt B Fib 1.3 9.0 69 >40  Poplt    11.3  4.0         Left Peroneal TA Motor (Tib Ant)  Fib Head    2.9 <4.5 5.9 >3 Poplit Fib Head 1.3 9.0 69 >40  Poplit    4.2  5.7         Left Tibial Motor (Abd Hall Brev)  Ankle    3.4 <6.0 8.4 >4 Knee Ankle 7.9 38.0 48 >40  Knee    11.3  4.8         Right Tibial Motor (Abd Hall Brev)  Ankle    3.7 <6.0 10.8 >4 Knee Ankle 7.1 38.0 54 >40  Knee    10.8  9.4          H Reflex Studies   NR H-Lat (ms) Lat Norm (ms) L-R H-Lat (ms)  Right Tibial (Gastroc)     32.24 <35    EMG   Side Muscle Ins Act Fibs Psw Fasc Number Recrt Dur Dur. Amp Amp. Poly Poly. Comment  Right AntTibialis  Nml Nml Nml Nml Nml Nml Nml Nml Nml Nml Nml Nml N/A  Right Flex Dig Long Nml Nml Nml Nml Nml Nml Nml Nml Nml Nml Nml Nml N/A  Right RectFemoris Nml Nml Nml Nml Nml Nml Nml Nml Nml Nml Nml Nml N/A  Right GluteusMed Nml Nml Nml Nml Nml Nml Nml Nml Nml Nml Nml Nml N/A  Right Gastroc Nml Nml Nml Nml Nml Nml Nml Nml Nml Nml Nml Nml N/A  Left AntTibialis Nml Nml Nml Nml Nml Nml Nml Nml Nml Nml Nml Nml N/A  Left Gastroc Nml Nml Nml Nml Nml Nml Nml Nml Nml Nml Nml Nml N/A  Left RectFemoris Nml Nml Nml Nml Nml Nml Nml Nml Nml Nml Nml Nml N/A      Waveforms:

## 2017-08-02 IMAGING — CR DG ANKLE COMPLETE 3+V*R*
3 series · 3 of 3 positions shown · non-contrast
Comparison: None.

CLINICAL DATA: Fall last night with lateral right ankle pain.

EXAM:
RIGHT ANKLE - COMPLETE 3+ VIEW

[ankle ap]
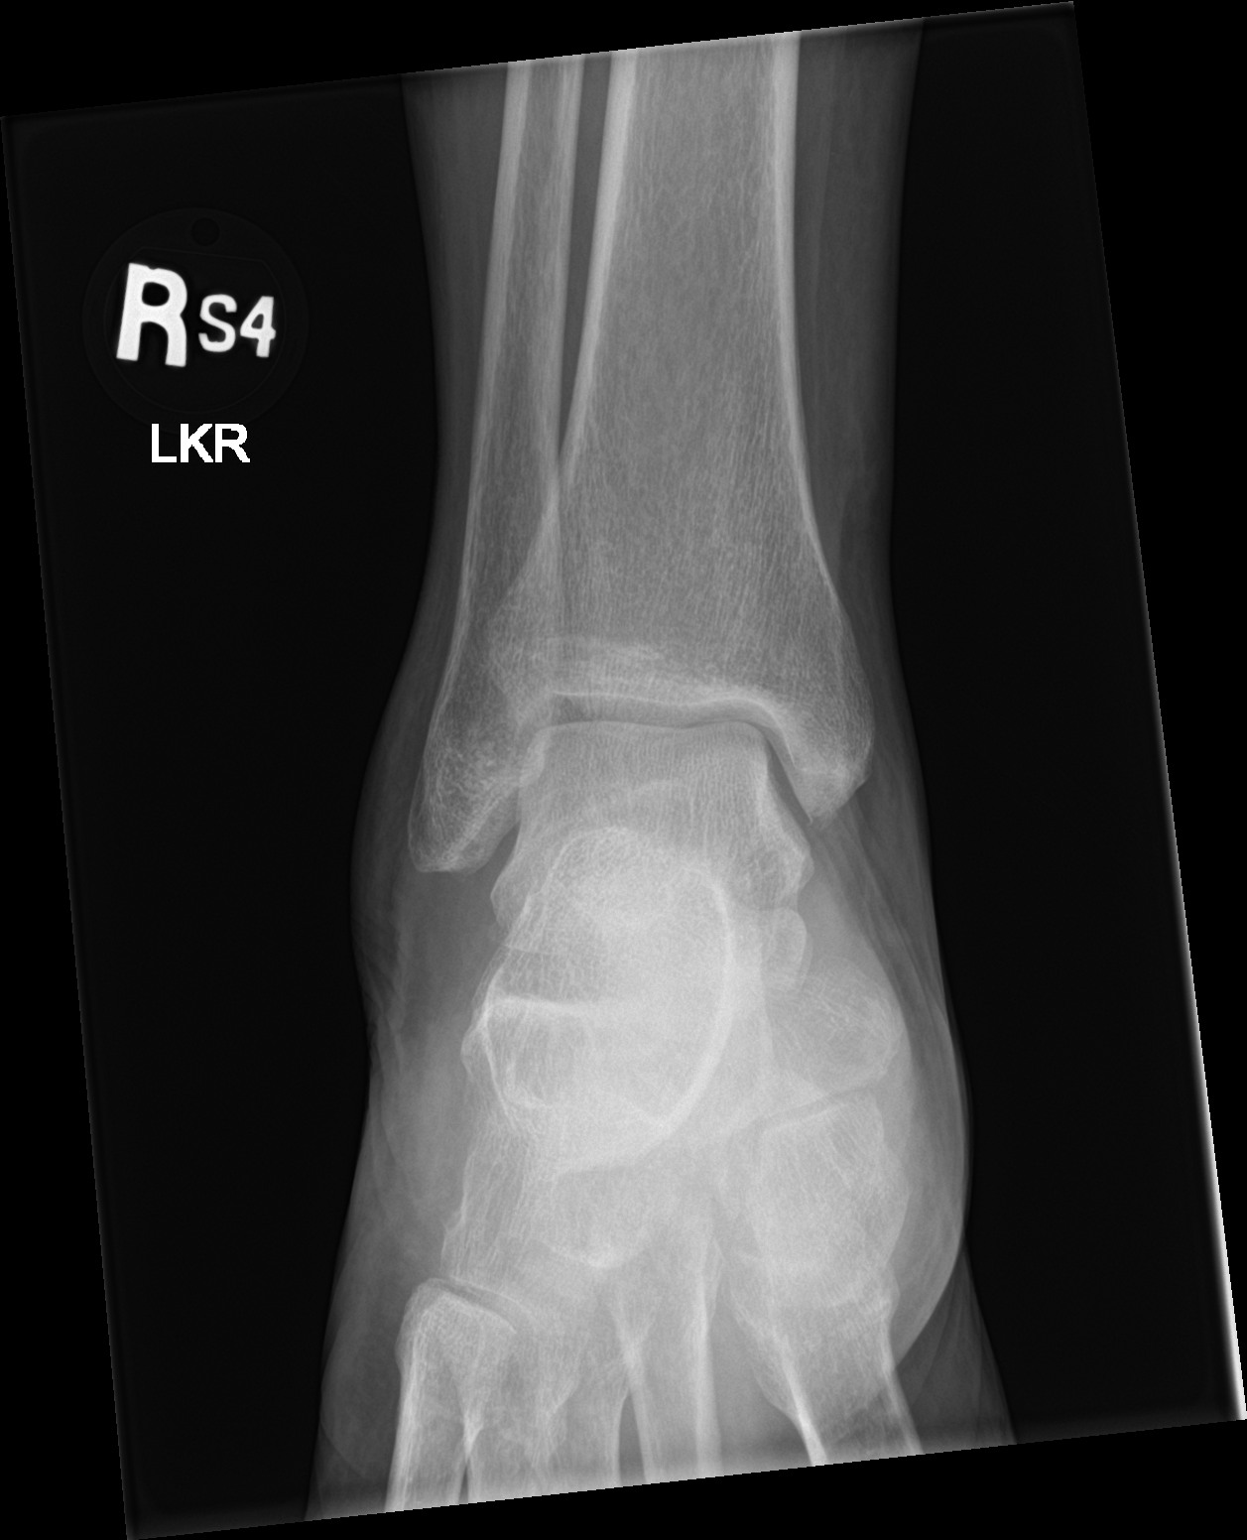

[ankle obl]
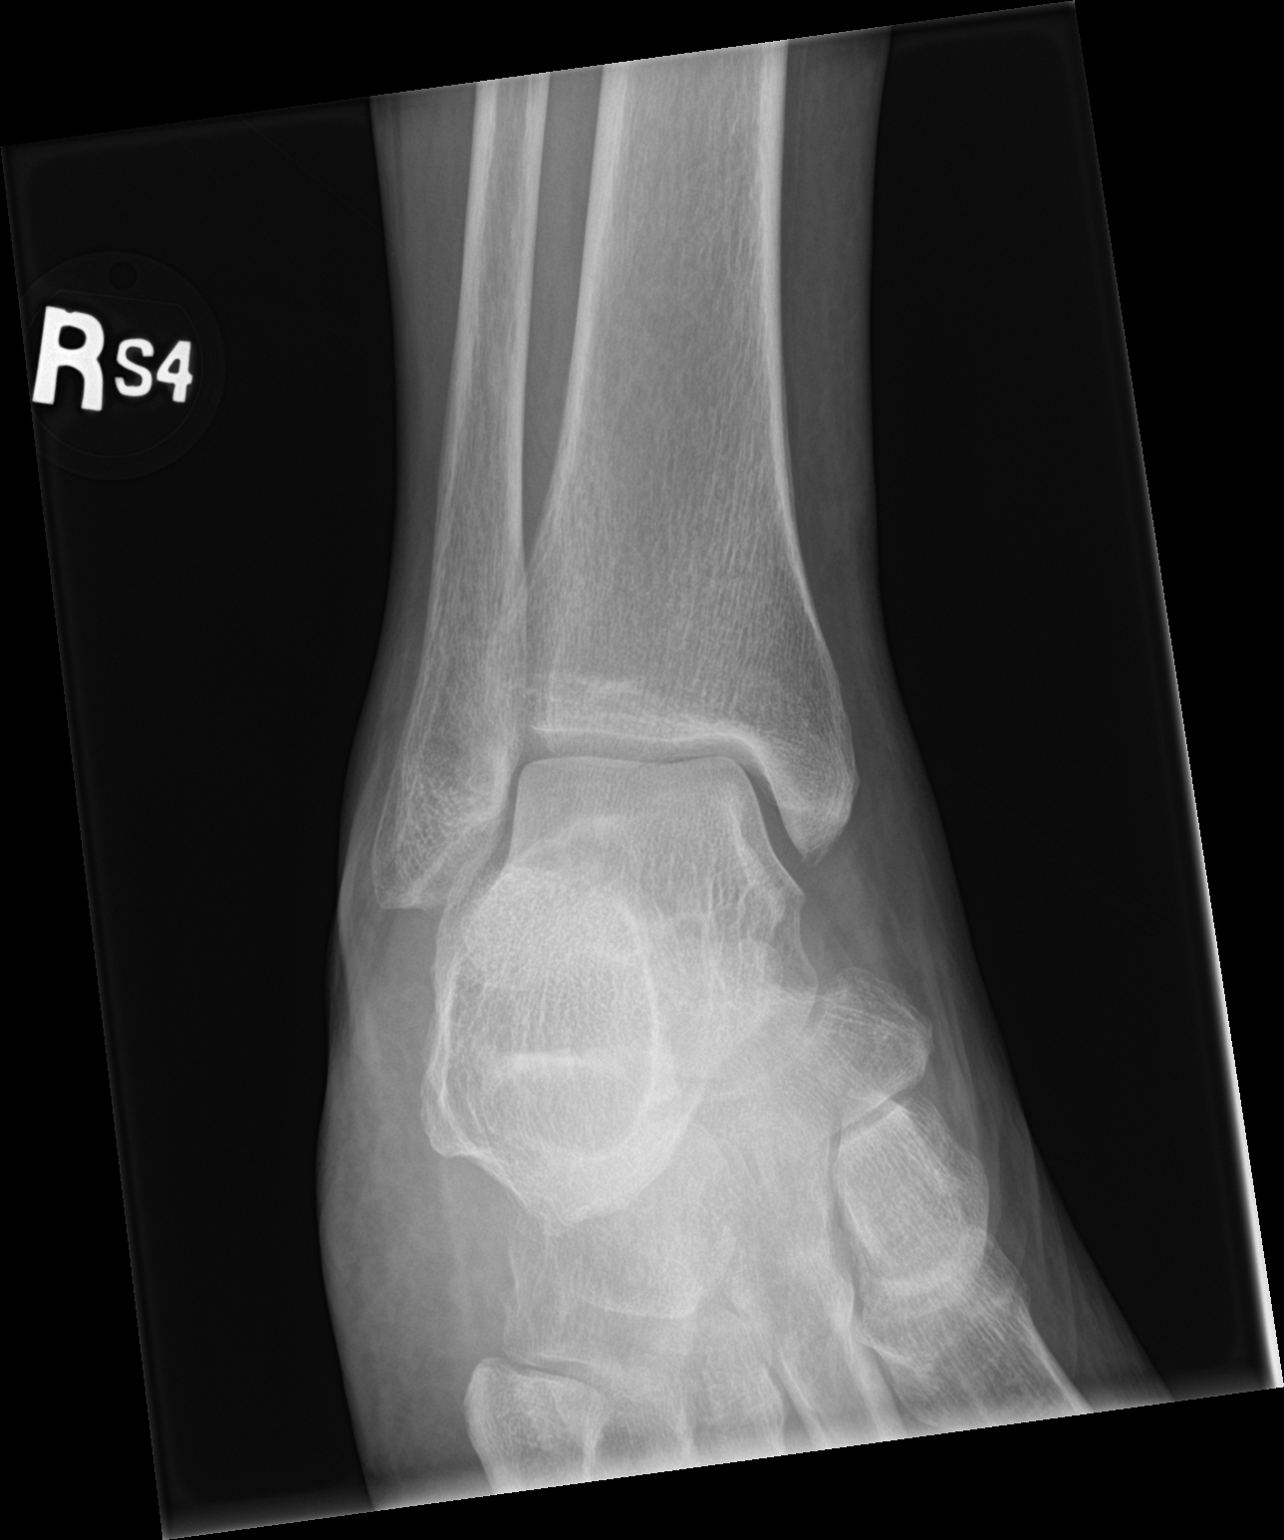

[ankle lat]
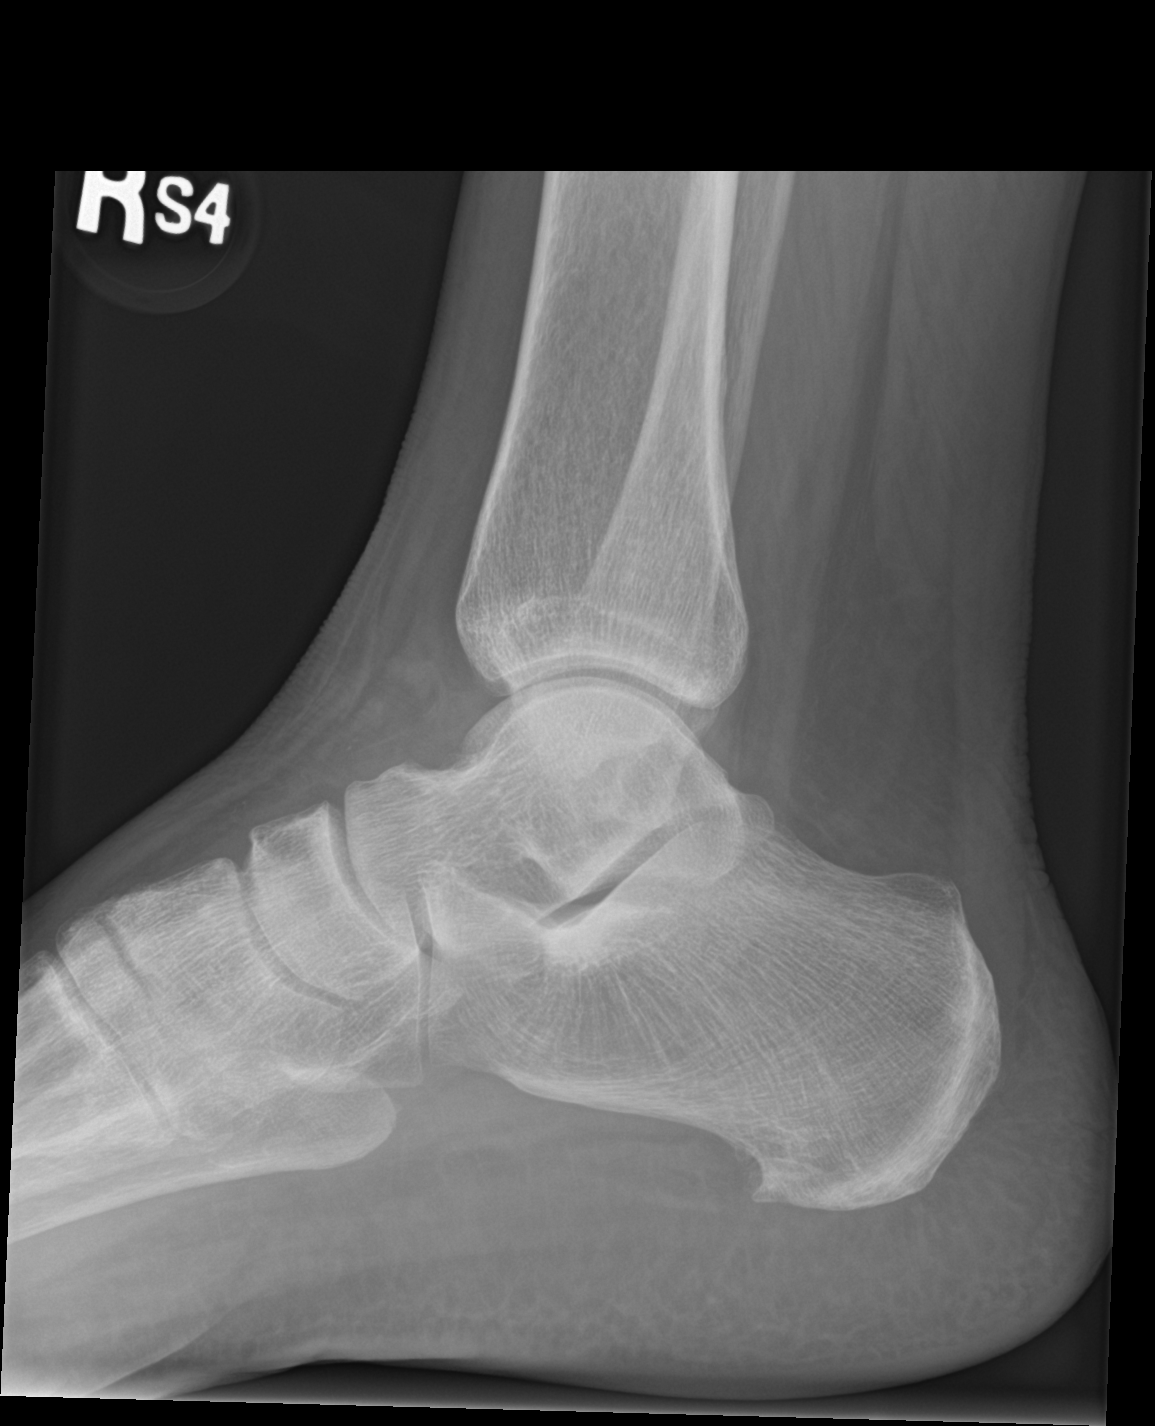

[3 of 3 positions shown; findings below may reference images not displayed]

FINDINGS: Examination demonstrates no evidence of acute fracture or
dislocation. Ankle mortise is within normal. Small inferior
calcaneal spur is present.
IMPRESSION: No acute findings.

## 2017-10-25 ENCOUNTER — Other Ambulatory Visit: Payer: Self-pay | Admitting: Internal Medicine

## 2017-10-25 DIAGNOSIS — Z139 Encounter for screening, unspecified: Secondary | ICD-10-CM

## 2017-11-24 ENCOUNTER — Ambulatory Visit
Admission: RE | Admit: 2017-11-24 | Discharge: 2017-11-24 | Disposition: A | Discharge status: Home / Self Care | Payer: Medicaid Other | Source: Ambulatory Visit | Attending: Internal Medicine | Admitting: Internal Medicine

## 2017-11-24 DIAGNOSIS — Z139 Encounter for screening, unspecified: Secondary | ICD-10-CM

## 2017-12-20 ENCOUNTER — Other Ambulatory Visit: Payer: Self-pay

## 2017-12-20 ENCOUNTER — Emergency Department: Payer: Medicaid Other

## 2017-12-20 ENCOUNTER — Observation Stay
Admission: EM | Admit: 2017-12-20 | Discharge: 2017-12-21 | Disposition: A | Discharge status: Home / Self Care | Payer: Medicaid Other | Attending: Internal Medicine | Admitting: Internal Medicine

## 2017-12-20 DIAGNOSIS — I110 Hypertensive heart disease with heart failure: Secondary | ICD-10-CM | POA: Diagnosis not present

## 2017-12-20 DIAGNOSIS — J440 Chronic obstructive pulmonary disease with acute lower respiratory infection: Secondary | ICD-10-CM | POA: Diagnosis not present

## 2017-12-20 DIAGNOSIS — K219 Gastro-esophageal reflux disease without esophagitis: Secondary | ICD-10-CM | POA: Diagnosis not present

## 2017-12-20 DIAGNOSIS — J441 Chronic obstructive pulmonary disease with (acute) exacerbation: Secondary | ICD-10-CM

## 2017-12-20 DIAGNOSIS — Z79899 Other long term (current) drug therapy: Secondary | ICD-10-CM | POA: Diagnosis not present

## 2017-12-20 DIAGNOSIS — Z8711 Personal history of peptic ulcer disease: Secondary | ICD-10-CM | POA: Diagnosis not present

## 2017-12-20 DIAGNOSIS — I2510 Atherosclerotic heart disease of native coronary artery without angina pectoris: Secondary | ICD-10-CM | POA: Diagnosis not present

## 2017-12-20 DIAGNOSIS — F17210 Nicotine dependence, cigarettes, uncomplicated: Secondary | ICD-10-CM | POA: Insufficient documentation

## 2017-12-20 DIAGNOSIS — I509 Heart failure, unspecified: Secondary | ICD-10-CM | POA: Diagnosis not present

## 2017-12-20 DIAGNOSIS — M797 Fibromyalgia: Secondary | ICD-10-CM | POA: Insufficient documentation

## 2017-12-20 DIAGNOSIS — Z7902 Long term (current) use of antithrombotics/antiplatelets: Secondary | ICD-10-CM | POA: Insufficient documentation

## 2017-12-20 DIAGNOSIS — J449 Chronic obstructive pulmonary disease, unspecified: Secondary | ICD-10-CM | POA: Diagnosis present

## 2017-12-20 DIAGNOSIS — F419 Anxiety disorder, unspecified: Secondary | ICD-10-CM | POA: Insufficient documentation

## 2017-12-20 DIAGNOSIS — J209 Acute bronchitis, unspecified: Secondary | ICD-10-CM | POA: Diagnosis not present

## 2017-12-20 LAB — CBC
HCT: 39 % (ref 35.0–47.0)
HEMOGLOBIN: 12.9 g/dL (ref 12.0–16.0)
MCH: 28.1 pg (ref 26.0–34.0)
MCHC: 33.1 g/dL (ref 32.0–36.0)
MCV: 85 fL (ref 80.0–100.0)
Platelets: 256 10*3/uL (ref 150–440)
RBC: 4.59 MIL/uL (ref 3.80–5.20)
RDW: 15 % — ABNORMAL HIGH (ref 11.5–14.5)
WBC: 9.6 10*3/uL (ref 3.6–11.0)

## 2017-12-20 LAB — TROPONIN I: Troponin I: <0.03 ng/mL (ref <0.03)

## 2017-12-20 LAB — BLOOD GAS, ARTERIAL
ACID-BASE DEFICIT: 0.2 mmol/L (ref 0.0–2.0)
BICARBONATE: 24.8 mmol/L (ref 20.0–28.0)
FIO2: 2
O2 Saturation: 98.4 %
PCO2 ART: 41 mmHg (ref 32.0–48.0)
PH ART: 7.39 (ref 7.350–7.450)
PO2 ART: 113 mmHg — AB (ref 83.0–108.0)
Patient temperature: 37

## 2017-12-20 LAB — BASIC METABOLIC PANEL
ANION GAP: 13 (ref 5–15)
BUN: 19 mg/dL (ref 6–20)
CHLORIDE: 101 mmol/L (ref 101–111)
CO2: 24 mmol/L (ref 22–32)
Calcium: 8.5 mg/dL — ABNORMAL LOW (ref 8.9–10.3)
Creatinine, Ser: 0.82 mg/dL (ref 0.44–1.00)
GFR calc non Af Amer: >60 mL/min (ref >60)
Glucose, Bld: 139 mg/dL — ABNORMAL HIGH (ref 65–99)
Potassium: 3.7 mmol/L (ref 3.5–5.1)
Sodium: 138 mmol/L (ref 135–145)

## 2017-12-20 LAB — BRAIN NATRIURETIC PEPTIDE: B Natriuretic Peptide: 165 pg/mL — ABNORMAL HIGH (ref 0.0–100.0)

## 2017-12-20 LAB — URINALYSIS, COMPLETE (UACMP) WITH MICROSCOPIC
BACTERIA UA: NONE SEEN
BILIRUBIN URINE: NEGATIVE
Glucose, UA: NEGATIVE mg/dL
Hgb urine dipstick: NEGATIVE
Ketones, ur: NEGATIVE mg/dL
NITRITE: NEGATIVE
PH: 5 (ref 5.0–8.0)
Protein, ur: NEGATIVE mg/dL
SPECIFIC GRAVITY, URINE: 1.016 (ref 1.005–1.030)

## 2017-12-20 MED ORDER — LEVALBUTEROL HCL 0.63 MG/3ML IN NEBU
0.6300 mg | INHALATION_SOLUTION | Freq: Four times a day (QID) | RESPIRATORY_TRACT | Status: DC
  Administered 2017-12-21: 0.63 mg via RESPIRATORY_TRACT
  Filled 2017-12-20 (×3): qty 3

## 2017-12-20 MED ORDER — METHYLPREDNISOLONE SODIUM SUCC 125 MG IJ SOLR
125.0000 mg | Freq: Once | INTRAMUSCULAR | Status: AC
  Administered 2017-12-20: 125 mg via INTRAVENOUS
  Filled 2017-12-20 (×2): qty 2

## 2017-12-20 MED ORDER — HYDROCODONE-ACETAMINOPHEN 5-325 MG PO TABS
1.0000 | ORAL_TABLET | ORAL | Status: DC | PRN
  Administered 2017-12-20 – 2017-12-21 (×4): 2 via ORAL
  Filled 2017-12-20 (×3): qty 2

## 2017-12-20 MED ORDER — SODIUM CHLORIDE 0.9 % IV SOLN: 250.0000 mL | INTRAVENOUS | Status: DC | PRN

## 2017-12-20 MED ORDER — ENOXAPARIN SODIUM 40 MG/0.4ML ~~LOC~~ SOLN
40.0000 mg | SUBCUTANEOUS | Status: DC
  Administered 2017-12-20: 40 mg via SUBCUTANEOUS
  Filled 2017-12-20: qty 0.4

## 2017-12-20 MED ORDER — PANTOPRAZOLE SODIUM 40 MG PO TBEC
40.0000 mg | DELAYED_RELEASE_TABLET | Freq: Every day | ORAL | Status: DC
  Administered 2017-12-20 – 2017-12-21 (×2): 40 mg via ORAL
  Filled 2017-12-20 (×2): qty 1

## 2017-12-20 MED ORDER — POLYETHYLENE GLYCOL 3350 17 G PO PACK: 17.0000 g | PACK | Freq: Every day | ORAL | Status: DC | PRN

## 2017-12-20 MED ORDER — BUDESONIDE 0.5 MG/2ML IN SUSP
0.5000 mg | Freq: Two times a day (BID) | RESPIRATORY_TRACT | Status: DC
  Administered 2017-12-20 – 2017-12-21 (×2): 0.5 mg via RESPIRATORY_TRACT
  Filled 2017-12-20 (×2): qty 2

## 2017-12-20 MED ORDER — DOCUSATE SODIUM 100 MG PO CAPS
100.0000 mg | ORAL_CAPSULE | Freq: Two times a day (BID) | ORAL | Status: DC
  Administered 2017-12-20 – 2017-12-21 (×2): 100 mg via ORAL
  Filled 2017-12-20 (×2): qty 1

## 2017-12-20 MED ORDER — CARVEDILOL 6.25 MG PO TABS
3.1250 mg | ORAL_TABLET | Freq: Two times a day (BID) | ORAL | Status: DC
  Administered 2017-12-20 – 2017-12-21 (×2): 3.125 mg via ORAL
  Filled 2017-12-20: qty 1
  Filled 2017-12-20: qty 0.5

## 2017-12-20 MED ORDER — GUAIFENESIN ER 600 MG PO TB12
600.0000 mg | ORAL_TABLET | Freq: Two times a day (BID) | ORAL | Status: DC
  Administered 2017-12-20 – 2017-12-21 (×2): 600 mg via ORAL
  Filled 2017-12-20 (×2): qty 1

## 2017-12-20 MED ORDER — ONDANSETRON HCL 4 MG PO TABS: 4.0000 mg | ORAL_TABLET | Freq: Four times a day (QID) | ORAL | Status: DC | PRN

## 2017-12-20 MED ORDER — ONDANSETRON HCL 4 MG/2ML IJ SOLN: 4.0000 mg | Freq: Four times a day (QID) | INTRAMUSCULAR | Status: DC | PRN

## 2017-12-20 MED ORDER — OXYCODONE-ACETAMINOPHEN 5-325 MG PO TABS
1.0000 | ORAL_TABLET | Freq: Once | ORAL | Status: AC
Start: 2017-12-20 — End: 2017-12-20
  Administered 2017-12-20: 1 via ORAL
  Filled 2017-12-20: qty 1

## 2017-12-20 MED ORDER — ACETAMINOPHEN 650 MG RE SUPP: 650.0000 mg | Freq: Four times a day (QID) | RECTAL | Status: DC | PRN

## 2017-12-20 MED ORDER — IPRATROPIUM-ALBUTEROL 0.5-2.5 (3) MG/3ML IN SOLN
3.0000 mL | Freq: Once | RESPIRATORY_TRACT | Status: AC
  Administered 2017-12-20: 3 mL via RESPIRATORY_TRACT
  Filled 2017-12-20: qty 3

## 2017-12-20 MED ORDER — SODIUM CHLORIDE 0.9% FLUSH
3.0000 mL | Freq: Two times a day (BID) | INTRAVENOUS | Status: DC
  Administered 2017-12-20: 3 mL via INTRAVENOUS

## 2017-12-20 MED ORDER — IPRATROPIUM BROMIDE 0.02 % IN SOLN
0.5000 mg | Freq: Four times a day (QID) | RESPIRATORY_TRACT | Status: DC
  Administered 2017-12-20 – 2017-12-21 (×2): 0.5 mg via RESPIRATORY_TRACT
  Filled 2017-12-20 (×3): qty 2.5

## 2017-12-20 MED ORDER — HYDROCODONE-ACETAMINOPHEN 5-325 MG PO TABS
ORAL_TABLET | ORAL | Status: AC
  Filled 2017-12-20: qty 2

## 2017-12-20 MED ORDER — ZOLPIDEM TARTRATE 5 MG PO TABS
10.0000 mg | ORAL_TABLET | Freq: Every evening | ORAL | Status: DC | PRN
  Administered 2017-12-21: 10 mg via ORAL
  Filled 2017-12-20: qty 2

## 2017-12-20 MED ORDER — DEXTROSE 5 % IV SOLN
1.0000 g | INTRAVENOUS | Status: DC
  Administered 2017-12-20: 1 g via INTRAVENOUS
  Filled 2017-12-20 (×2): qty 10

## 2017-12-20 MED ORDER — NITROGLYCERIN 0.4 MG SL SUBL
SUBLINGUAL_TABLET | SUBLINGUAL | Status: AC
  Filled 2017-12-20: qty 1

## 2017-12-20 MED ORDER — METHYLPREDNISOLONE SODIUM SUCC 125 MG IJ SOLR
80.0000 mg | Freq: Four times a day (QID) | INTRAMUSCULAR | Status: DC
  Administered 2017-12-20 – 2017-12-21 (×3): 80 mg via INTRAVENOUS
  Filled 2017-12-20 (×3): qty 2

## 2017-12-20 MED ORDER — DIAZEPAM 5 MG PO TABS: 5.0000 mg | ORAL_TABLET | Freq: Four times a day (QID) | ORAL | Status: DC | PRN

## 2017-12-20 MED ORDER — NITROGLYCERIN 0.4 MG SL SUBL
0.4000 mg | SUBLINGUAL_TABLET | SUBLINGUAL | Status: AC
  Administered 2017-12-20: 0.4 mg via SUBLINGUAL

## 2017-12-20 MED ORDER — SODIUM CHLORIDE 0.9% FLUSH: 3.0000 mL | INTRAVENOUS | Status: DC | PRN

## 2017-12-20 MED ORDER — ACETAMINOPHEN 325 MG PO TABS: 650.0000 mg | ORAL_TABLET | Freq: Four times a day (QID) | ORAL | Status: DC | PRN

## 2017-12-20 MED ORDER — NICOTINE 14 MG/24HR TD PT24
14.0000 mg | MEDICATED_PATCH | Freq: Every day | TRANSDERMAL | Status: DC
  Administered 2017-12-21: 14 mg via TRANSDERMAL
  Filled 2017-12-20: qty 1

## 2017-12-20 MED ORDER — IOPAMIDOL (ISOVUE-370) INJECTION 76%
75.0000 mL | Freq: Once | INTRAVENOUS | Status: AC | PRN
  Administered 2017-12-20: 75 mL via INTRAVENOUS
  Filled 2017-12-20: qty 75

## 2017-12-20 NOTE — ED Triage Notes (Signed)
Pt c/o palpations for 2 weeks and has been on a heart monitor, states today the palpation are worse, pt also c/o cough with congestion for the past couple of days and BL flank pain  Worse with urination. Pt is anxious in triage and tearful . Pt worries about a lot of things.

## 2017-12-20 NOTE — Progress Notes (Signed)
   12/20/17 2030  Clinical Encounter Type  Visited With Patient  Visit Type Initial;Other (Comment) (Advanced Directive education)  Referral From [place] responded to advanced directive order.  Provided education regarding advanced directive and how the process works.  Patient to review copy overnight and in the morning.  Patient desires a chaplain to follow up as she would like to complete the document during her hospitalization.  Discussion of patient's spiritual needs; chaplain offered prayer.

## 2017-12-20 NOTE — Progress Notes (Signed)
Nitro SL given x 1 for chest pains with no relief. Pt began receiving breathing treatment and she states her pain has improved with this. Less "tightness". MD notified. Cardiac Monitor placed. Will continue to monitor. Orders to restart coreg as well and night time ambien.

## 2017-12-20 NOTE — ED Provider Notes (Signed)
[facility] Emergency Department Provider Note ____________________________________________   I have reviewed the triage vital signs and the triage nursing note.  HISTORY  Chief Complaint Shortness of Breath and Flank Pain   Historian Patient  HPI Chelsea Ramsey is a 52 y.o. female with a history of asthma/COPD, wears 3 L home O2 at night, and if she needs it during the day, history of palpitations and tachycardia with she states her resting heart rate around 98-105, a leaky mitral valve, CHF for which she takes 80 mg of Lasix in the mornings, presenting today with about 1-2 weeks of worsening shortness of breath and chest pain which is worse with coughing.  States that she saw her primary care doctor for chest pain and shortness of breath and wheezing and was treated with steroid taper for about 10 days which she has completed at this point time.  She is continued to have wheezing and shortness of breath and nonproductive cough associated with now chest pain especially with coughing and with breathing.  She states that she feels body aches and just does not feel well.  She states that she is been having palpitations and heart racing.  Pain is bandlike around the chest and somewhat pressure-like, and is moderate to severe at times.  No fevers or chills.  She reports that she thinks that she is probably also retaining fluid.   Past Medical History:  Diagnosis Date  . Allergy    seasonal  . Anxiety   . Arthritis    Right Knee  . Asthma   . CHF (congestive heart failure) ([place])   . COPD (chronic obstructive pulmonary disease) ([place])   . Coronary artery disease    Leaky heart valve  . Fibromyalgia   . GERD (gastroesophageal reflux disease)   . Hypertension   . Pneumonia   . PUD (peptic ulcer disease)   . Pulmonary HTN ([place])   . Rheumatic fever/heart disease   . Sleep apnea     Patient Active Problem List   Diagnosis Date Noted  . Primary localized  osteoarthritis of right knee 09/04/2016    Past Surgical History:  Procedure Laterality Date  . ADENOIDECTOMY    . MULTIPLE TOOTH EXTRACTIONS    . TONSILLECTOMY    . TOTAL KNEE ARTHROPLASTY Right 09/04/2016   Procedure: TOTAL KNEE ARTHROPLASTY; with lateral release;  Surgeon: Earlie Server, MD;  Location: [place];  Service: Orthopedics;  Laterality: Right;    Prior to Admission medications   Medication Sig Start Date End Date Taking? Authorizing Provider  acetaminophen (TYLENOL) 325 MG tablet Take 650 mg by mouth. 07/10/15   [provider]  albuterol (PROVENTIL HFA;VENTOLIN HFA) 108 (90 Base) MCG/ACT inhaler Inhale 2 puffs into the lungs every 6 (six) hours as needed for shortness of breath. 02/26/16   [provider]  apixaban (ELIQUIS) 2.5 MG TABS tablet Take 1 tablet (2.5 mg total) by mouth 2 (two) times daily. 09/04/16   Chadwell, Vonna Kotyk, PA-C  ARTIFICIAL TEAR OP Apply 2 drops to eye daily as needed (dry eyes).    [provider]  budesonide-formoterol (SYMBICORT) 160-4.5 MCG/ACT inhaler Inhale 2 puffs into the lungs 2 (two) times daily. 02/26/16   [provider]  carvedilol (COREG) 3.125 MG tablet Take 3.125 mg by mouth. 02/12/15   [provider]  diphenhydramine-acetaminophen (TYLENOL PM) 25-500 MG TABS tablet Take 2 tablets by mouth at bedtime as needed (headaches).    [provider]  furosemide (LASIX) 80  MG tablet Take 80 mg by mouth. 07/31/15   [provider]  gabapentin (NEURONTIN) 300 MG capsule Take 600 mg by mouth. 07/22/15   [provider]  ipratropium-albuterol (DUONEB) 0.5-2.5 (3) MG/3ML SOLN Inhale 3 mL by nebulization every six (6) hours as needed. 02/26/16 02/25/17  [provider]  losartan (COZAAR) 25 MG tablet Take 25 mg by mouth daily.    [provider]  nicotine (RA NICOTINE) 7 mg/24hr patch Place onto the skin. 12/23/15   [provider]  omeprazole (PRILOSEC) 20 MG  capsule Take 40 mg by mouth 2 (two) times daily. 02/18/16   [provider]  orphenadrine (NORFLEX) 100 MG tablet Take 1 tablet (100 mg total) by mouth 2 (two) times daily as needed for muscle spasms. 09/18/16   Menshew, Dannielle Karvonen, PA-C  PARoxetine (PAXIL) 10 MG tablet Take 10 mg by mouth daily.    [provider]  predniSONE (DELTASONE) 10 MG tablet Take 1 tablet (10 mg total) by mouth 2 (two) times daily with a meal. 09/18/16   Menshew, Dannielle Karvonen, PA-C  vortioxetine HBr (TRINTELLIX) 10 MG TABS Take 10 mg by mouth daily as needed (mood swings /anxiety).    [provider]  zolpidem (AMBIEN) 10 MG tablet Take 10 mg by mouth at bedtime.    [provider]    Allergies  Allergen Reactions  . Aspirin Swelling  . Flexeril [Cyclobenzaprine] Swelling  . Morphine And Related Swelling  . Trazamine [Trazodone & Diet Manage Prod] Nausea And Vomiting  . Codeine Rash  . Tramadol Rash    Family History  Problem Relation Age of Onset  . COPD Mother   . Cancer Mother        Bone  . Asthma Mother   . Congestive Heart Failure Father     Social History Social History   Tobacco Use  . Smoking status: Current Some Day Smoker    Types: Cigarettes  . Smokeless tobacco: Never Used  Substance Use Topics  . Alcohol use: Yes    Alcohol/week: 1.8 oz    Types: 3 Cans of beer per week    Comment: 16 oz per week  . Drug use: Yes    Types: Marijuana    Comment: Previous use of cocaine and marijuana last use 07/31/16     Review of Systems  Constitutional: Negative for fever. Eyes: Negative for visual changes. ENT: Negative for sore throat. Cardiovascular: Positive for chest pain. Respiratory: Positive for wheezing and for shortness of breath. Gastrointestinal: Negative for abdominal pain, vomiting and diarrhea. Genitourinary: Negative for dysuria. Musculoskeletal: Negative for back pain. Skin: Negative for rash. Neurological: Negative for  headache.  ____________________________________________   PHYSICAL EXAM:  VITAL SIGNS: ED Triage Vitals  Enc Vitals Group     BP 12/20/17 1152 131/81     Pulse Rate 12/20/17 1152 99     Resp 12/20/17 1152 18     Temp 12/20/17 1152 98.4 F (36.9 C)     Temp Source 12/20/17 1152 Oral     SpO2 12/20/17 1152 98 %     Weight 12/20/17 1154 200 lb (90.7 kg)     Height 12/20/17 1154 5\' 4"  (1.626 m)     Head Circumference --      Peak Flow --      Pain Score 12/20/17 1154 10     Pain Loc --      Pain Edu? --      Excl. in  GC? --      Constitutional: Alert and oriented.  Patient seems quite upset and although in no acute distress, she does seem very anxious. HEENT   Head: Normocephalic and atraumatic.      Eyes: Conjunctivae are normal. Pupils equal and round.       Ears:         Nose: No congestion/rhinnorhea.   Mouth/Throat: Mucous membranes are moist.   Neck: No stridor. Cardiovascular/Chest: Normal rate, regular rhythm.  No murmurs, rubs, or gallops. Respiratory: Normal respiratory effort without tachypnea nor retractions.  Tight breath sounds with wheezy bronchitic cough.  No rales or rhonchi. Gastrointestinal: Soft. No distention, no guarding, no rebound. Nontender.    Genitourinary/rectal:Deferred Musculoskeletal: Nontender with normal range of motion in all extremities. No joint effusions.  No lower extremity tenderness.  No edema. Neurologic:  Normal speech and language. No gross or focal neurologic deficits are appreciated. Skin:  Skin is warm, dry and intact. No rash noted. Psychiatric: Mood and affect are normal. Speech and behavior are normal. Patient exhibits appropriate insight and judgment.   ____________________________________________  LABS (pertinent positives/negatives) I, Lisa Roca, MD the attending physician have reviewed the labs noted below.  Labs Reviewed  URINALYSIS, COMPLETE (UACMP) WITH MICROSCOPIC - Abnormal; Notable for the following  components:      Result Value   Color, Urine YELLOW (*)    APPearance CLEAR (*)    Leukocytes, UA TRACE (*)    Squamous Epithelial / LPF 0-5 (*)    All other components within normal limits  BASIC METABOLIC PANEL - Abnormal; Notable for the following components:   Glucose, Bld 139 (*)    Calcium 8.5 (*)    All other components within normal limits  CBC - Abnormal; Notable for the following components:   RDW 15.0 (*)    All other components within normal limits  BRAIN NATRIURETIC PEPTIDE - Abnormal; Notable for the following components:   B Natriuretic Peptide 165.0 (*)    All other components within normal limits  TROPONIN I    ____________________________________________    EKG I, Lisa Roca, MD, the attending physician have personally viewed and interpreted all ECGs.  112 bpm.  Sinus tachycardia.  Narrow transfer normal axis.  Normal ST and T wave ____________________________________________  RADIOLOGY All Xrays were viewed by me.  Imaging interpreted by Radiologist, and I, Lisa Roca, MD the attending physician have reviewed the radiologist interpretation noted below.  Chest x-ray:FINDINGS: Mild peribronchial thickening. Heart is upper limits normal in size. No confluent opacities or effusions. No acute bony abnormality.  IMPRESSION: Mild bronchitic changes.  Chest CT for PE:  IMPRESSION: Mild enlargement of cardiac chambers.  No evidence of pulmonary embolism.  No acute intrathoracic abnormalities. __________________________________________  PROCEDURES  Procedure(s) performed: None  Critical Care performed: None   ____________________________________________  ED COURSE / ASSESSMENT AND PLAN  Pertinent labs & imaging results that were available during my care of the patient were reviewed by me and considered in my medical decision making (see chart for details).   Patient is having persistent wheezing and trouble breathing after course of  outpatient treatment for COPD exacerbation/bronchitis.  I am going to also continue evaluation for component of CHF and rule out PE as well.  Initiating treatment with Solu-Medrol and DuoNeb treatments here.  Heart rate between 99 and 105 at rest.  She is having very frequent bronchospastic cough here just at rest.  No additional concerning findings for CHF with known elevated BNP, and  a CT of the chest without pulmonary edema.  I think her main exacerbating condition right now COPD with exacerbation.  She is unable to tolerate barely at rest with tachycardia and persistent wheezing and bronchospastic cough, she is failed outpatient therapy.  We will discussed with hospitalist for admission.  DIFFERENTIAL DIAGNOSIS: Differential includes, but is not limited to, viral syndrome, bronchitis including COPD exacerbation, pneumonia, reactive airway disease including asthma, CHF including exacerbation with or without pulmonary/interstitial edema, pneumothorax, ACS, thoracic trauma, and pulmonary embolism.  Differential diagnosis includes, but is not limited to, ACS, aortic dissection, pulmonary embolism, cardiac tamponade, pneumothorax, pneumonia, pericarditis, myocarditis, GI-related causes including esophagitis/gastritis, and musculoskeletal chest wall pain.    CONSULTATIONS:   Hospitalist for admission   Patient / Family / Caregiver informed of clinical course, medical decision-making process, and agree with plan.   ___________________________________________   FINAL CLINICAL IMPRESSION(S) / ED DIAGNOSES   Final diagnoses:  COPD exacerbation ([place])      ___________________________________________        Note: This dictation was prepared with Dragon dictation. Any transcriptional errors that result from this process are unintentional    Lisa Roca, MD 12/20/17 1556

## 2017-12-20 NOTE — ED Notes (Signed)
Marguarite Arbour, RN to transport pt to floor. Jeanette Caprice, RN aware and ready for pt.

## 2017-12-20 NOTE — Progress Notes (Signed)
Pt have c/o chest pain radiating to arm. Also have palpitation feeling.  Plan   Start and keep in cardiac monitor.   Troponin stat and then Q4 hrly X3   EKG   Nitro SL tab for now.

## 2017-12-20 NOTE — H&P (Addendum)
[facility] [place] NAME: Chelsea Ramsey    MR#:  [id number]  DATE OF BIRTH:  06/23/1966  DATE OF ADMISSION:  12/20/2017  PRIMARY CARE PHYSICIAN: Dagmar Hait, MD   REQUESTING/REFERRING PHYSICIAN:   CHIEF COMPLAINT:   Chief Complaint  Patient presents with  . Shortness of Breath  . Flank Pain    HISTORY OF PRESENT ILLNESS: Chelsea Ramsey  is a 52 y.o. female with a known history per below which also includes asthma, COPD, chronic respiratory failure on 2 L during the day/4 L at night, recently completed 10-day course of steroid taper by primary care provider for COPD exacerbation, presents with shortness of breath, chest tightness, in the emergency room patient was 98% on room air, workup largely unimpressive, urinalysis abnormals-cannot rule out UTI, patient evaluated in the emergency room, patient noted to have severe emotional lability-crying in the room, patient is now being admitted for mild COPD exacerbation, anxiety unspecified, and questionable UTI.  PAST MEDICAL HISTORY:   Past Medical History:  Diagnosis Date  . Allergy    seasonal  . Anxiety   . Arthritis    Right Knee  . Asthma   . CHF (congestive heart failure) ([employer])   . COPD (chronic obstructive pulmonary disease) ([place])   . Coronary artery disease    Leaky heart valve  . Fibromyalgia   . GERD (gastroesophageal reflux disease)   . Hypertension   . Pneumonia   . PUD (peptic ulcer disease)   . Pulmonary HTN ([place])   . Rheumatic fever/heart disease   . Sleep apnea     PAST SURGICAL HISTORY:  Past Surgical History:  Procedure Laterality Date  . ADENOIDECTOMY    . MULTIPLE TOOTH EXTRACTIONS    . TONSILLECTOMY    . TOTAL KNEE ARTHROPLASTY Right 09/04/2016   Procedure: TOTAL KNEE ARTHROPLASTY; with lateral release;  Surgeon: Earlie Server, MD;  Location: [place];  Service: Orthopedics;  Laterality: Right;    SOCIAL HISTORY:  Social History   Tobacco Use  .  Smoking status: Current Some Day Smoker    Types: Cigarettes  . Smokeless tobacco: Never Used  Substance Use Topics  . Alcohol use: Yes    Alcohol/week: 1.8 oz    Types: 3 Cans of beer per week    Comment: 16 oz per week    FAMILY HISTORY:  Family History  Problem Relation Age of Onset  . COPD Mother   . Cancer Mother        Bone  . Asthma Mother   . Congestive Heart Failure Father     DRUG ALLERGIES:  Allergies  Allergen Reactions  . Aspirin Swelling  . Flexeril [Cyclobenzaprine] Swelling  . Morphine And Related Swelling  . Trazamine [Trazodone & Diet Manage Prod] Nausea And Vomiting  . Codeine Rash  . Tramadol Rash    REVIEW OF SYSTEMS:   CONSTITUTIONAL: No fever, +fatigue or weakness.  EYES: No blurred or double vision.  EARS, NOSE, AND THROAT: No tinnitus or ear pain.  RESPIRATORY: + cough, shortness of breath, wheezing, no hemoptysis.  CARDIOVASCULAR: No chest pain, orthopnea, edema.  GASTROINTESTINAL: No nausea, vomiting, diarrhea or abdominal pain.  GENITOURINARY: No dysuria, hematuria.  ENDOCRINE: No polyuria, nocturia,  HEMATOLOGY: No anemia, easy bruising or bleeding SKIN: No rash or lesion. MUSCULOSKELETAL: No joint pain or arthritis.   NEUROLOGIC: No tingling, numbness, weakness.  PSYCHIATRY: No anxiety or depression.   MEDICATIONS AT HOME:  Prior to Admission  medications   Medication Sig Start Date End Date Taking? Authorizing Provider  acetaminophen (TYLENOL) 325 MG tablet Take 650 mg by mouth. 07/10/15   [provider]  albuterol (PROVENTIL HFA;VENTOLIN HFA) 108 (90 Base) MCG/ACT inhaler Inhale 2 puffs into the lungs every 6 (six) hours as needed for shortness of breath. 02/26/16   [provider]  apixaban (ELIQUIS) 2.5 MG TABS tablet Take 1 tablet (2.5 mg total) by mouth 2 (two) times daily. 09/04/16   Chadwell, Vonna Kotyk, PA-C  ARTIFICIAL TEAR OP Apply 2 drops to eye daily as needed (dry eyes).    [provider]   budesonide-formoterol (SYMBICORT) 160-4.5 MCG/ACT inhaler Inhale 2 puffs into the lungs 2 (two) times daily. 02/26/16   [provider]  carvedilol (COREG) 3.125 MG tablet Take 3.125 mg by mouth. 02/12/15   [provider]  diphenhydramine-acetaminophen (TYLENOL PM) 25-500 MG TABS tablet Take 2 tablets by mouth at bedtime as needed (headaches).    [provider]  furosemide (LASIX) 80 MG tablet Take 80 mg by mouth. 07/31/15   [provider]  gabapentin (NEURONTIN) 300 MG capsule Take 600 mg by mouth. 07/22/15   [provider]  ipratropium-albuterol (DUONEB) 0.5-2.5 (3) MG/3ML SOLN Inhale 3 mL by nebulization every six (6) hours as needed. 02/26/16 02/25/17  [provider]  losartan (COZAAR) 25 MG tablet Take 25 mg by mouth daily.    [provider]  nicotine (RA NICOTINE) 7 mg/24hr patch Place onto the skin. 12/23/15   [provider]  omeprazole (PRILOSEC) 20 MG capsule Take 40 mg by mouth 2 (two) times daily. 02/18/16   [provider]  orphenadrine (NORFLEX) 100 MG tablet Take 1 tablet (100 mg total) by mouth 2 (two) times daily as needed for muscle spasms. 09/18/16   Menshew, Dannielle Karvonen, PA-C  PARoxetine (PAXIL) 10 MG tablet Take 10 mg by mouth daily.    [provider]  predniSONE (DELTASONE) 10 MG tablet Take 1 tablet (10 mg total) by mouth 2 (two) times daily with a meal. 09/18/16   Menshew, Dannielle Karvonen, PA-C  vortioxetine HBr (TRINTELLIX) 10 MG TABS Take 10 mg by mouth daily as needed (mood swings /anxiety).    [provider]  zolpidem (AMBIEN) 10 MG tablet Take 10 mg by mouth at bedtime.    [provider]      PHYSICAL EXAMINATION:   VITAL SIGNS: Blood pressure 131/81, pulse 99, temperature 98.4 F (36.9 C), temperature source Oral, resp. rate 18, height 5\' 4"  (1.626 m), weight 90.7 kg (200 lb), last menstrual period 11/08/2009, SpO2 98 %.  GENERAL:  52 y.o.-year-old  patient lying in the bed with no acute distress.  EYES: Pupils equal, round, reactive to light and accommodation. No scleral icterus. Extraocular muscles intact.  HEENT: Head atraumatic, normocephalic. Oropharynx and nasopharynx clear.  NECK:  Supple, no jugular venous distention. No thyroid enlargement, no tenderness.  LUNGS: Normal breath sounds bilaterally, no wheezing, rales,rhonchi or crepitation. No use of accessory muscles of respiration.  CARDIOVASCULAR: S1, S2 normal. No murmurs, rubs, or gallops.  ABDOMEN: Soft, nontender, nondistended. Bowel sounds present. No organomegaly or mass.  EXTREMITIES: No pedal edema, cyanosis, or clubbing.  NEUROLOGIC: Cranial nerves II through XII are intact. MAES. Gait not checked.  PSYCHIATRIC: The patient is alert and oriented x 3.  Emotional lability, anxiety, crying in the room SKIN: No obvious rash, lesion, or ulcer.   LABORATORY PANEL:   CBC Recent Labs  Lab 12/20/17  1155  WBC 9.6  HGB 12.9  HCT 39.0  PLT 256  MCV 85.0  MCH 28.1  MCHC 33.1  RDW 15.0*   ------------------------------------------------------------------------------------------------------------------  Chemistries  Recent Labs  Lab 12/20/17 1155  NA 138  K 3.7  CL 101  CO2 24  GLUCOSE 139*  BUN 19  CREATININE 0.82  CALCIUM 8.5*   ------------------------------------------------------------------------------------------------------------------ estimated creatinine clearance is 89.5 mL/min (by C-G formula based on SCr of 0.82 mg/dL). ------------------------------------------------------------------------------------------------------------------ No results for input(s): TSH, T4TOTAL, T3FREE, THYROIDAB in the last 72 hours.  Invalid input(s): FREET3   Coagulation profile No results for input(s): INR, PROTIME in the last 168 hours. ------------------------------------------------------------------------------------------------------------------- No results  for input(s): DDIMER in the last 72 hours. -------------------------------------------------------------------------------------------------------------------  Cardiac Enzymes Recent Labs  Lab 12/20/17 1155  TROPONINI <0.03   ------------------------------------------------------------------------------------------------------------------ Invalid input(s): POCBNP  ---------------------------------------------------------------------------------------------------------------  Urinalysis    Component Value Date/Time   COLORURINE YELLOW (A) 12/20/2017 1155   APPEARANCEUR CLEAR (A) 12/20/2017 1155   APPEARANCEUR CLEAR 02/20/2015 1015   LABSPEC 1.016 12/20/2017 1155   LABSPEC 1.016 02/20/2015 1015   PHURINE 5.0 12/20/2017 1155   GLUCOSEU NEGATIVE 12/20/2017 [place] 02/20/2015 1015   HGBUR NEGATIVE 12/20/2017 [place] 12/20/2017 [place] 02/20/2015 1015   KETONESUR NEGATIVE 12/20/2017 1155   PROTEINUR NEGATIVE 12/20/2017 1155   NITRITE NEGATIVE 12/20/2017 1155   LEUKOCYTESUR TRACE (A) 12/20/2017 1155   LEUKOCYTESUR 2+ 02/20/2015 1015     RADIOLOGY: Dg Chest 2 View  Result Date: 12/20/2017 CLINICAL DATA:  Chest pain EXAM: CHEST  2 VIEW COMPARISON:  12/10/2015 FINDINGS: Mild peribronchial thickening. Heart is upper limits normal in size. No confluent opacities or effusions. No acute bony abnormality. IMPRESSION: Mild bronchitic changes. Electronically Signed   By: Rolm Baptise M.D.   On: 12/20/2017 12:23   Ct Angio Chest Pe W/cm &/or Wo Cm  Result Date: 12/20/2017 CLINICAL DATA:  Palpitations for 2 weeks, cough with chest congestion for couple days, BILATERAL flank pain, pain worse with urination, history hypertension, GERD, coronary artery disease, COPD, CHF, asthma, fibromyalgia, smoker EXAM: CT ANGIOGRAPHY CHEST WITH CONTRAST TECHNIQUE: Multidetector CT imaging of the chest was performed using the standard protocol during  bolus administration of intravenous contrast. Multiplanar CT image reconstructions and MIPs were obtained to evaluate the vascular anatomy. CONTRAST:  77mL ISOVUE-370 IOPAMIDOL (ISOVUE-370) INJECTION 76% IV COMPARISON:  12/08/2013 FINDINGS: Cardiovascular: Aorta normal caliber without aneurysm or dissection. Mild enlargement of cardiac chambers. No pericardial effusion. Pulmonary arteries adequately opacified and patent. No evidence of pulmonary embolism. Mediastinum/Nodes: Esophagus unremarkable. Base of cervical region normal appearance. No thoracic adenopathy. Lungs/Pleura: Lungs clear. No pulmonary infiltrate, pleural effusion or pneumothorax. Upper Abdomen: Visualized upper abdomen unremarkable. Musculoskeletal: No acute osseous findings. Review of the MIP images confirms the above findings. IMPRESSION: Mild enlargement of cardiac chambers. No evidence of pulmonary embolism. No acute intrathoracic abnormalities. Electronically Signed   By: Lavonia Dana M.D.   On: 12/20/2017 15:29    EKG: Orders placed or performed during the hospital encounter of 12/20/17  . EKG 12-Lead  . EKG 12-Lead  . ED EKG  . 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 Nicotine patch and cessation counseling ordered  3 history of congestive heart failure without exacerbation Stable Continue home regiment  4 chronic GERD without esophagitis PPI daily  5 obesity Most likely secondary  to excess calories Lifestyle modification recommended  6 chronic GAD, NOS Currently uncontrolled Valium as needed Complete MAR when available  7 incomplete MAR Complete when available Psychiatry consult while in house   8 acute abnormal urinalysis/possible UTI Rocephin for 3-day course and follow-up on urine culture  Full code Condition stable  All the records are reviewed and case discussed with ED provider. Management plans discussed with the patient, family and they are in agreement.  CODE STATUS: Code Status History    Date Active Date Inactive Code Status Order ID Comments User Context   09/04/2016 14:49 09/05/2016 17:15 Full Code [id number]  Chriss Czar, PA-C Inpatient       TOTAL TIME TAKING CARE OF THIS PATIENT: 40 minutes.    Avel Peace Coreon Simkins M.D on 12/20/2017   Between 7am to 6pm - Pager - [phone]  After 6pm go to [email or web address] - password [email or web address] [facility]  Office  [phone]  CC: Primary care physician; Dagmar Hait, MD   Note: This dictation was prepared with Dragon dictation along with smaller phrase technology. Any transcriptional errors that result from this process are unintentional.

## 2017-12-20 NOTE — ED Triage Notes (Signed)
First nurse note:  Pt to ed with c/o chest pain and sob.  EKG done on arrival to ED.

## 2017-12-21 DIAGNOSIS — J449 Chronic obstructive pulmonary disease, unspecified: Secondary | ICD-10-CM | POA: Diagnosis present

## 2017-12-21 LAB — TROPONIN I: Troponin I: <0.03 ng/mL (ref <0.03)

## 2017-12-21 MED ORDER — DOXYCYCLINE HYCLATE 100 MG PO CAPS: 100.0000 mg | ORAL_CAPSULE | Freq: Two times a day (BID) | ORAL | 0 refills | Status: AC

## 2017-12-21 MED ORDER — FUROSEMIDE 40 MG PO TABS
80.0000 mg | ORAL_TABLET | Freq: Every day | ORAL | Status: DC
  Administered 2017-12-21: 80 mg via ORAL
  Filled 2017-12-21: qty 2

## 2017-12-21 MED ORDER — PREDNISONE 20 MG PO TABS: 40.0000 mg | ORAL_TABLET | Freq: Every day | ORAL | 0 refills | Status: AC

## 2017-12-21 NOTE — Discharge Summary (Signed)
[facility] NAME: Chelsea Ramsey    MR#:  [id number]  DATE OF BIRTH:  October 22, 1966  DATE OF ADMISSION:  12/20/2017 ADMITTING PHYSICIAN: Gorden Harms, MD  DATE OF DISCHARGE: 12/21/2017  PRIMARY CARE PHYSICIAN: Dagmar Hait, MD    ADMISSION DIAGNOSIS:  COPD exacerbation ([place]) [J44.1]  DISCHARGE DIAGNOSIS:  Active Problems:   COPD (chronic obstructive pulmonary disease) with acute bronchitis ([place])   SECONDARY DIAGNOSIS:   Past Medical History:  Diagnosis Date  . Allergy    seasonal  . Anxiety   . Arthritis    Right Knee  . Asthma   . CHF (congestive heart failure) ([place])   . COPD (chronic obstructive pulmonary disease) ([place])   . Coronary artery disease    Leaky heart valve  . Fibromyalgia   . GERD (gastroesophageal reflux disease)   . Hypertension   . Pneumonia   . PUD (peptic ulcer disease)   . Pulmonary HTN ([place])   . Rheumatic fever/heart disease   . Sleep apnea     HOSPITAL COURSE:   52 year old female with a history of COPD and chronic hypoxic respiratory failure on chronic oxygen who presents with shortness of breath.  1. Acute on chronic COPD exacerbation, mild: Patient symptoms have improved. She will be discharged on oral prednisone and doxycycline for acute bronchitis. CT chest was negative for pulmonary emboli. She will continue with outpatient inhalers and oxygen.  2.Tobacco dependence: Patient is encouraged to quit smoking. Counseling was provided for 4 minutes. 3. Essential hypertension: Continue losartan and Coreg 4. GERD: Continue PPI 5. Obesity: Patient encouraged to lose weight as tolerated and would benefit from outpatient pulmonary sleep evaluation.  DISCHARGE CONDITIONS AND DIET:   Stable for discharge on heart healthy diet  CONSULTS OBTAINED:    DRUG ALLERGIES:   Allergies  Allergen Reactions  . Aspirin Swelling  . Flexeril [Cyclobenzaprine] Swelling  . Morphine And Related  Swelling  . Trazamine [Trazodone & Diet Manage Prod] Nausea And Vomiting  . Codeine Rash  . Tramadol Rash    DISCHARGE MEDICATIONS:   Allergies as of 12/21/2017      Reactions   Aspirin Swelling   Flexeril [cyclobenzaprine] Swelling   Morphine And Related Swelling   Trazamine [trazodone & Diet Manage Prod] Nausea And Vomiting   Codeine Rash   Tramadol Rash      Medication List    STOP taking these medications   apixaban 2.5 MG Tabs tablet Commonly known as:  ELIQUIS   orphenadrine 100 MG tablet Commonly known as:  NORFLEX     TAKE these medications   albuterol 108 (90 Base) MCG/ACT inhaler Commonly known as:  PROVENTIL HFA;VENTOLIN HFA Inhale 2 puffs into the lungs every 6 (six) hours as needed for shortness of breath.   ARTIFICIAL TEAR OP Apply 2 drops to eye daily as needed (dry eyes).   carvedilol 3.125 MG tablet Commonly known as:  COREG Take 3.125 mg by mouth 2 (two) times daily with a meal.   diphenhydramine-acetaminophen 25-500 MG Tabs tablet Commonly known as:  TYLENOL PM Take 2 tablets by mouth at bedtime as needed (sleep).   doxycycline 100 MG capsule Commonly known as:  VIBRAMYCIN Take 1 capsule (100 mg total) by mouth 2 (two) times daily for 4 days.   furosemide 80 MG tablet Commonly known as:  LASIX Take 80 mg by mouth.   gabapentin 300 MG capsule Commonly known as:  NEURONTIN Take 600 mg  by mouth 3 (three) times daily.   ipratropium-albuterol 0.5-2.5 (3) MG/3ML Soln Commonly known as:  DUONEB Inhale 3 mL by nebulization twice daily.   losartan 25 MG tablet Commonly known as:  COZAAR Take 25 mg by mouth daily.   omeprazole 20 MG capsule Commonly known as:  PRILOSEC Take 40 mg by mouth 2 (two) times daily.   predniSONE 20 MG tablet Commonly known as:  DELTASONE Take 2 tablets (40 mg total) by mouth daily with breakfast for 4 days. What changed:    medication strength  how much to take  when to take this   RA NICOTINE 7 mg/24hr  patch Generic drug:  nicotine Place 7 mg onto the skin daily.   SYMBICORT 160-4.5 MCG/ACT inhaler Generic drug:  budesonide-formoterol Inhale 2 puffs into the lungs 2 (two) times daily.   TRINTELLIX 10 MG Tabs Generic drug:  vortioxetine HBr Take 10 mg by mouth daily as needed (mood swings /anxiety).   zolpidem 10 MG tablet Commonly known as:  AMBIEN Take 10 mg by mouth at bedtime.         Today   CHIEF COMPLAINT:  Still with cough however shortness of breath has improved   VITAL SIGNS:  Blood pressure 115/77, pulse (!) 102, temperature 98.2 F (36.8 C), temperature source Oral, resp. rate 20, height 5\' 4"  (1.626 m), weight 90.7 kg (200 lb), last menstrual period 11/08/2009, SpO2 100 %.   REVIEW OF SYSTEMS:  Review of Systems  Constitutional: Negative.  Negative for chills, fever and malaise/fatigue.  HENT: Negative.  Negative for ear discharge, ear pain, hearing loss, nosebleeds and sore throat.   Eyes: Negative.  Negative for blurred vision and pain.  Respiratory: Positive for cough. Negative for hemoptysis, shortness of breath and wheezing.   Cardiovascular: Negative.  Negative for chest pain, palpitations and leg swelling.  Gastrointestinal: Negative.  Negative for abdominal pain, blood in stool, diarrhea, nausea and vomiting.  Genitourinary: Negative.  Negative for dysuria.  Musculoskeletal: Negative.  Negative for back pain.  Skin: Negative.   Neurological: Negative for dizziness, tremors, speech change, focal weakness, seizures and headaches.  Endo/Heme/Allergies: Negative.  Does not bruise/bleed easily.  Psychiatric/Behavioral: Negative.  Negative for depression, hallucinations and suicidal ideas.     PHYSICAL EXAMINATION:  GENERAL:  52 y.o.-year-old patient lying in the bed with no acute distress.  NECK:  Supple, no jugular venous distention. No thyroid enlargement, no tenderness.  LUNGS: Normal breath sounds bilaterally, no wheezing, rales,rhonchi  No  use of accessory muscles of respiration.  CARDIOVASCULAR: S1, S2 normal. No murmurs, rubs, or gallops.  ABDOMEN: Soft, non-tender, non-distended. Bowel sounds present. No organomegaly or mass.  EXTREMITIES: No pedal edema, cyanosis, or clubbing.  PSYCHIATRIC: The patient is alert and oriented x 3.  SKIN: No obvious rash, lesion, or ulcer.   DATA REVIEW:   CBC Recent Labs  Lab 12/20/17 1155  WBC 9.6  HGB 12.9  HCT 39.0  PLT 256    Chemistries  Recent Labs  Lab 12/20/17 1155  NA 138  K 3.7  CL 101  CO2 24  GLUCOSE 139*  BUN 19  CREATININE 0.82  CALCIUM 8.5*    Cardiac Enzymes Recent Labs  Lab 12/21/17 0020 12/21/17 0357 12/21/17 0747  TROPONINI <0.03 <0.03 <0.03    Microbiology Results  @MICRORSLT48 @  RADIOLOGY:  Dg Chest 2 View  Result Date: 12/20/2017 CLINICAL DATA:  Chest pain EXAM: CHEST  2 VIEW COMPARISON:  12/10/2015 FINDINGS: Mild peribronchial thickening. Heart is upper  limits normal in size. No confluent opacities or effusions. No acute bony abnormality. IMPRESSION: Mild bronchitic changes. Electronically Signed   By: Rolm Baptise M.D.   On: 12/20/2017 12:23   Ct Angio Chest Pe W/cm &/or Wo Cm  Result Date: 12/20/2017 CLINICAL DATA:  Palpitations for 2 weeks, cough with chest congestion for couple days, BILATERAL flank pain, pain worse with urination, history hypertension, GERD, coronary artery disease, COPD, CHF, asthma, fibromyalgia, smoker EXAM: CT ANGIOGRAPHY CHEST WITH CONTRAST TECHNIQUE: Multidetector CT imaging of the chest was performed using the standard protocol during bolus administration of intravenous contrast. Multiplanar CT image reconstructions and MIPs were obtained to evaluate the vascular anatomy. CONTRAST:  27mL ISOVUE-370 IOPAMIDOL (ISOVUE-370) INJECTION 76% IV COMPARISON:  12/08/2013 FINDINGS: Cardiovascular: Aorta normal caliber without aneurysm or dissection. Mild enlargement of cardiac chambers. No pericardial effusion. Pulmonary  arteries adequately opacified and patent. No evidence of pulmonary embolism. Mediastinum/Nodes: Esophagus unremarkable. Base of cervical region normal appearance. No thoracic adenopathy. Lungs/Pleura: Lungs clear. No pulmonary infiltrate, pleural effusion or pneumothorax. Upper Abdomen: Visualized upper abdomen unremarkable. Musculoskeletal: No acute osseous findings. Review of the MIP images confirms the above findings. IMPRESSION: Mild enlargement of cardiac chambers. No evidence of pulmonary embolism. No acute intrathoracic abnormalities. Electronically Signed   By: Lavonia Dana M.D.   On: 12/20/2017 15:29      Allergies as of 12/21/2017      Reactions   Aspirin Swelling   Flexeril [cyclobenzaprine] Swelling   Morphine And Related Swelling   Trazamine [trazodone & Diet Manage Prod] Nausea And Vomiting   Codeine Rash   Tramadol Rash      Medication List    STOP taking these medications   apixaban 2.5 MG Tabs tablet Commonly known as:  ELIQUIS   orphenadrine 100 MG tablet Commonly known as:  NORFLEX     TAKE these medications   albuterol 108 (90 Base) MCG/ACT inhaler Commonly known as:  PROVENTIL HFA;VENTOLIN HFA Inhale 2 puffs into the lungs every 6 (six) hours as needed for shortness of breath.   ARTIFICIAL TEAR OP Apply 2 drops to eye daily as needed (dry eyes).   carvedilol 3.125 MG tablet Commonly known as:  COREG Take 3.125 mg by mouth 2 (two) times daily with a meal.   diphenhydramine-acetaminophen 25-500 MG Tabs tablet Commonly known as:  TYLENOL PM Take 2 tablets by mouth at bedtime as needed (sleep).   doxycycline 100 MG capsule Commonly known as:  VIBRAMYCIN Take 1 capsule (100 mg total) by mouth 2 (two) times daily for 4 days.   furosemide 80 MG tablet Commonly known as:  LASIX Take 80 mg by mouth.   gabapentin 300 MG capsule Commonly known as:  NEURONTIN Take 600 mg by mouth 3 (three) times daily.   ipratropium-albuterol 0.5-2.5 (3) MG/3ML  Soln Commonly known as:  DUONEB Inhale 3 mL by nebulization twice daily.   losartan 25 MG tablet Commonly known as:  COZAAR Take 25 mg by mouth daily.   omeprazole 20 MG capsule Commonly known as:  PRILOSEC Take 40 mg by mouth 2 (two) times daily.   predniSONE 20 MG tablet Commonly known as:  DELTASONE Take 2 tablets (40 mg total) by mouth daily with breakfast for 4 days. What changed:    medication strength  how much to take  when to take this   RA NICOTINE 7 mg/24hr patch Generic drug:  nicotine Place 7 mg onto the skin daily.   SYMBICORT 160-4.5 MCG/ACT inhaler Generic drug:  budesonide-formoterol Inhale 2 puffs into the lungs 2 (two) times daily.   TRINTELLIX 10 MG Tabs Generic drug:  vortioxetine HBr Take 10 mg by mouth daily as needed (mood swings /anxiety).   zolpidem 10 MG tablet Commonly known as:  AMBIEN Take 10 mg by mouth at bedtime.         Management plans discussed with the patient and she is in agreement. Stable for discharge home  Patient should follow up with pcp  CODE STATUS:     Code Status Orders  (From admission, onward)        Start     Ordered   12/20/17 1947  Full code  Continuous     12/20/17 1946    Code Status History    Date Active Date Inactive Code Status Order ID Comments User Context   09/04/2016 14:49 09/05/2016 17:15 Full Code [id number]  Chriss Czar, PA-C Inpatient      TOTAL TIME TAKING CARE OF THIS PATIENT: 38 minutes.    Note: This dictation was prepared with Dragon dictation along with smaller phrase technology. Any transcriptional errors that result from this process are unintentional.  Sandrea Boer M.D on 12/21/2017 at 9:10 AM  Between 7am to 6pm - Pager - [phone] After 6pm go to [email or web address] - password [email or web address] [facility]  Office  [phone]  CC: Primary care physician; Dagmar Hait, MD

## 2017-12-22 LAB — URINE CULTURE: Culture: <10000 — AB

## 2017-12-22 LAB — HIV ANTIBODY (ROUTINE TESTING W REFLEX): HIV Screen 4th Generation wRfx: NONREACTIVE

## 2017-12-28 ENCOUNTER — Other Ambulatory Visit: Payer: Self-pay | Admitting: Physical Medicine and Rehabilitation

## 2017-12-28 DIAGNOSIS — M5416 Radiculopathy, lumbar region: Secondary | ICD-10-CM

## 2018-01-05 ENCOUNTER — Ambulatory Visit
Admission: RE | Admit: 2018-01-05 | Discharge: 2018-01-05 | Disposition: A | Discharge status: Home / Self Care | Payer: Medicaid Other | Source: Ambulatory Visit | Attending: Physical Medicine and Rehabilitation | Admitting: Physical Medicine and Rehabilitation

## 2018-01-05 DIAGNOSIS — M5416 Radiculopathy, lumbar region: Secondary | ICD-10-CM | POA: Diagnosis present

## 2018-01-05 DIAGNOSIS — M5116 Intervertebral disc disorders with radiculopathy, lumbar region: Secondary | ICD-10-CM | POA: Insufficient documentation

## 2018-02-24 ENCOUNTER — Emergency Department
Admission: EM | Admit: 2018-02-24 | Discharge: 2018-02-24 | Disposition: A | Discharge status: Home / Self Care | Payer: Medicaid Other | Attending: Emergency Medicine | Admitting: Emergency Medicine

## 2018-02-24 ENCOUNTER — Emergency Department: Payer: Medicaid Other

## 2018-02-24 ENCOUNTER — Other Ambulatory Visit: Payer: Self-pay

## 2018-02-24 DIAGNOSIS — E876 Hypokalemia: Secondary | ICD-10-CM | POA: Insufficient documentation

## 2018-02-24 DIAGNOSIS — Z79899 Other long term (current) drug therapy: Secondary | ICD-10-CM | POA: Insufficient documentation

## 2018-02-24 DIAGNOSIS — M1000 Idiopathic gout, unspecified site: Secondary | ICD-10-CM | POA: Insufficient documentation

## 2018-02-24 DIAGNOSIS — F17210 Nicotine dependence, cigarettes, uncomplicated: Secondary | ICD-10-CM | POA: Insufficient documentation

## 2018-02-24 DIAGNOSIS — I10 Essential (primary) hypertension: Secondary | ICD-10-CM | POA: Insufficient documentation

## 2018-02-24 DIAGNOSIS — D508 Other iron deficiency anemias: Secondary | ICD-10-CM | POA: Diagnosis not present

## 2018-02-24 DIAGNOSIS — J449 Chronic obstructive pulmonary disease, unspecified: Secondary | ICD-10-CM | POA: Insufficient documentation

## 2018-02-24 DIAGNOSIS — I2510 Atherosclerotic heart disease of native coronary artery without angina pectoris: Secondary | ICD-10-CM | POA: Diagnosis not present

## 2018-02-24 DIAGNOSIS — M79672 Pain in left foot: Secondary | ICD-10-CM | POA: Diagnosis present

## 2018-02-24 LAB — CBC WITH DIFFERENTIAL/PLATELET
Basophils Absolute: 0.1 10*3/uL (ref 0–0.1)
Basophils Relative: 1 %
Eosinophils Absolute: 0.1 10*3/uL (ref 0–0.7)
Eosinophils Relative: 1 %
HCT: 33.7 % — ABNORMAL LOW (ref 35.0–47.0)
HEMOGLOBIN: 10.8 g/dL — AB (ref 12.0–16.0)
LYMPHS ABS: 1.2 10*3/uL (ref 1.0–3.6)
LYMPHS PCT: 11 %
MCH: 26.8 pg (ref 26.0–34.0)
MCHC: 32.1 g/dL (ref 32.0–36.0)
MCV: 83.6 fL (ref 80.0–100.0)
MONOS PCT: 6 %
Monocytes Absolute: 0.7 10*3/uL (ref 0.2–0.9)
NEUTROS PCT: 81 %
Neutro Abs: 8.6 10*3/uL — ABNORMAL HIGH (ref 1.4–6.5)
Platelets: 282 10*3/uL (ref 150–440)
RBC: 4.03 MIL/uL (ref 3.80–5.20)
RDW: 15.7 % — ABNORMAL HIGH (ref 11.5–14.5)
WBC: 10.6 10*3/uL (ref 3.6–11.0)

## 2018-02-24 LAB — COMPREHENSIVE METABOLIC PANEL
ALK PHOS: 80 U/L (ref 38–126)
ALT: 21 U/L (ref 14–54)
ANION GAP: 12 (ref 5–15)
AST: 23 U/L (ref 15–41)
Albumin: 3.8 g/dL (ref 3.5–5.0)
BILIRUBIN TOTAL: 0.6 mg/dL (ref 0.3–1.2)
BUN: 10 mg/dL (ref 6–20)
CALCIUM: 8.8 mg/dL — AB (ref 8.9–10.3)
CO2: 28 mmol/L (ref 22–32)
CREATININE: 0.71 mg/dL (ref 0.44–1.00)
Chloride: 98 mmol/L — ABNORMAL LOW (ref 101–111)
GFR calc Af Amer: >60 mL/min (ref >60)
Glucose, Bld: 115 mg/dL — ABNORMAL HIGH (ref 65–99)
Potassium: 3.4 mmol/L — ABNORMAL LOW (ref 3.5–5.1)
Sodium: 138 mmol/L (ref 135–145)
TOTAL PROTEIN: 7.9 g/dL (ref 6.5–8.1)

## 2018-02-24 LAB — URIC ACID: URIC ACID, SERUM: 10.6 mg/dL — AB (ref 2.3–6.6)

## 2018-02-24 MED ORDER — POTASSIUM CHLORIDE ER 10 MEQ PO TBCR
10.0000 meq | EXTENDED_RELEASE_TABLET | Freq: Every day | ORAL | 0 refills | Status: DC
Start: 2018-02-24 — End: 2018-05-25

## 2018-02-24 MED ORDER — ALLOPURINOL 300 MG PO TABS: 300.0000 mg | ORAL_TABLET | Freq: Every day | ORAL | 2 refills | Status: DC

## 2018-02-24 MED ORDER — COLCHICINE 0.6 MG PO TABS: 0.6000 mg | ORAL_TABLET | Freq: Every day | ORAL | 2 refills | Status: DC

## 2018-02-24 MED ORDER — HYDROCODONE-ACETAMINOPHEN 5-325 MG PO TABS
1.0000 | ORAL_TABLET | Freq: Once | ORAL | Status: AC
  Administered 2018-02-24: 1 via ORAL
  Filled 2018-02-24: qty 1

## 2018-02-24 MED ORDER — OXYCODONE-ACETAMINOPHEN 5-325 MG PO TABS: 1.0000 | ORAL_TABLET | ORAL | 0 refills | Status: DC | PRN

## 2018-02-24 MED ORDER — METHYLPREDNISOLONE 4 MG PO TBPK: ORAL_TABLET | ORAL | 0 refills | Status: DC

## 2018-02-24 NOTE — ED Provider Notes (Signed)
[facility] Emergency Department Provider Note  ____________________________________________   First MD Initiated Contact with Patient 02/24/18 1250     (approximate)  I have reviewed the triage vital signs and the nursing notes.   HISTORY  Chief Complaint Foot Pain    HPI Chelsea Ramsey is a 52 y.o. female presents emergency department complaining of left foot pain.  She states pain is sharp and throbbing.  She cannot get comfortable.  She does not want sheet touch it.  She states there is some redness and swelling.  She has no history of diabetes.  She states she has been on fluid pills recently.  They just switched her from Lasix to a different medication.  She denies any fever chills  Past Medical History:  Diagnosis Date  . Allergy    seasonal  . Anxiety   . Arthritis    Right Knee  . Asthma   . CHF (congestive heart failure) ([place])   . COPD (chronic obstructive pulmonary disease) ([place])   . Coronary artery disease    Leaky heart valve  . Fibromyalgia   . GERD (gastroesophageal reflux disease)   . Hypertension   . Pneumonia   . PUD (peptic ulcer disease)   . Pulmonary HTN ([place])   . Rheumatic fever/heart disease   . Sleep apnea     Patient Active Problem List   Diagnosis Date Noted  . COPD (chronic obstructive pulmonary disease) ([place]) 12/21/2017  . COPD (chronic obstructive pulmonary disease) with acute bronchitis ([place]) 12/20/2017  . Primary localized osteoarthritis of right knee 09/04/2016    Past Surgical History:  Procedure Laterality Date  . ADENOIDECTOMY    . MULTIPLE TOOTH EXTRACTIONS    . TONSILLECTOMY    . TOTAL KNEE ARTHROPLASTY Right 09/04/2016   Procedure: TOTAL KNEE ARTHROPLASTY; with lateral release;  Surgeon: Earlie Server, MD;  Location: [facility];  Service: Orthopedics;  Laterality: Right;    Prior to Admission medications   Medication Sig Start Date End Date Taking? Authorizing Provider  albuterol (PROVENTIL  HFA;VENTOLIN HFA) 108 (90 Base) MCG/ACT inhaler Inhale 2 puffs into the lungs every 6 (six) hours as needed for shortness of breath. 02/26/16   [provider]  allopurinol (ZYLOPRIM) 300 MG tablet Take 1 tablet (300 mg total) by mouth daily. 02/24/18   Shye Doty, Linden Dolin, PA-C  ARTIFICIAL TEAR OP Apply 2 drops to eye daily as needed (dry eyes).    [provider]  budesonide-formoterol (SYMBICORT) 160-4.5 MCG/ACT inhaler Inhale 2 puffs into the lungs 2 (two) times daily. 02/26/16   [provider]  carvedilol (COREG) 3.125 MG tablet Take 3.125 mg by mouth 2 (two) times daily with a meal.  02/12/15   [provider]  colchicine 0.6 MG tablet Take 1 tablet (0.6 mg total) by mouth daily. 02/24/18 02/24/19  Shemeka Wardle, Linden Dolin, PA-C  diphenhydramine-acetaminophen (TYLENOL PM) 25-500 MG TABS tablet Take 2 tablets by mouth at bedtime as needed (sleep).     [provider]  furosemide (LASIX) 80 MG tablet Take 80 mg by mouth. 07/31/15   [provider]  gabapentin (NEURONTIN) 300 MG capsule Take 600 mg by mouth 3 (three) times daily.  07/22/15   [provider]  ipratropium-albuterol (DUONEB) 0.5-2.5 (3) MG/3ML SOLN Inhale 3 mL by nebulization twice daily. 02/26/16 12/20/18  [provider]  losartan (COZAAR) 25 MG tablet Take 25 mg by mouth daily.    [provider]  methylPREDNISolone (MEDROL DOSEPAK) 4 MG  TBPK tablet Take 6 pills on day one then decrease by 1 pill each day 02/24/18   Versie Starks, PA-C  nicotine (RA NICOTINE) 7 mg/24hr patch Place 7 mg onto the skin daily.  12/23/15   [provider]  omeprazole (PRILOSEC) 20 MG capsule Take 40 mg by mouth 2 (two) times daily. 02/18/16   [provider]  oxyCODONE-acetaminophen (PERCOCET/ROXICET) 5-325 MG tablet Take 1 tablet by mouth every 4 (four) hours as needed for severe pain. 02/24/18   Matilyn Fehrman, Linden Dolin, PA-C  potassium chloride (K-DUR) 10 MEQ tablet Take 1 tablet (10  mEq total) by mouth daily. 02/24/18   Kaytie Ratcliffe, Linden Dolin, PA-C  vortioxetine HBr (TRINTELLIX) 10 MG TABS Take 10 mg by mouth daily as needed (mood swings /anxiety).    [provider]  zolpidem (AMBIEN) 10 MG tablet Take 10 mg by mouth at bedtime.    [provider]    Allergies Aspirin; Flexeril [cyclobenzaprine]; Morphine and related; Trazamine [trazodone & diet manage prod]; Codeine; and Tramadol  Family History  Problem Relation Age of Onset  . COPD Mother   . Cancer Mother        Bone  . Asthma Mother   . Congestive Heart Failure Father     Social History Social History   Tobacco Use  . Smoking status: Current Some Day Smoker    Packs/day: 0.50    Types: Cigarettes  . Smokeless tobacco: Never Used  Substance Use Topics  . Alcohol use: Yes    Alcohol/week: 1.8 oz    Types: 3 Cans of beer per week    Comment: 16 oz per week  . Drug use: Not Currently    Comment: Previous use of cocaine and marijuana last use 07/31/16     Review of Systems  Constitutional: No fever/chills Eyes: No visual changes. ENT: No sore throat. Respiratory: Denies cough Genitourinary: Negative for dysuria. Musculoskeletal: Negative for back pain.  Positive for left foot pain Skin: Positive for redness and cracked feet on the left foot    ____________________________________________   PHYSICAL EXAM:  VITAL SIGNS: ED Triage Vitals  Enc Vitals Group     BP 02/24/18 1232 108/69     Pulse Rate 02/24/18 1232 95     Resp 02/24/18 1232 18     Temp 02/24/18 1232 98.1 F (36.7 C)     Temp Source 02/24/18 1232 Oral     SpO2 02/24/18 1232 95 %     Weight 02/24/18 1233 270 lb (122.5 kg)     Height 02/24/18 1233 5\' 4"  (1.626 m)     Head Circumference --      Peak Flow --      Pain Score 02/24/18 1233 10     Pain Loc --      Pain Edu? --      Excl. in [place]? --     Constitutional: Alert and oriented. Well appearing and in no acute distress. Eyes: Conjunctivae are normal.    Head: Atraumatic. Nose: No congestion/rhinnorhea. Mouth/Throat: Mucous membranes are moist.   Cardiovascular: Normal rate, regular rhythm.  Heart sounds are normal Respiratory: Normal respiratory effort.  No retractions, lungs are clear to auscultation GU: deferred Musculoskeletal: FROM all extremities, warm and well perfused, the left foot is tender around the distal portion of the left great toe.  There is some redness extending up into the midfoot.  The foot is very dry with hard calluses that are cracked on the bottom.  The  foot is tender along the great toe and up the first metatarsal Neurologic:  Normal speech and language.  Skin:  Skin is warm, dry skin on foot as described above psychiatric: Mood and affect are normal. Speech and behavior are normal.  ____________________________________________   LABS (all labs ordered are listed, but only abnormal results are displayed)  Labs Reviewed  CBC WITH DIFFERENTIAL/PLATELET - Abnormal; Notable for the following components:      Result Value   Hemoglobin 10.8 (*)    HCT 33.7 (*)    RDW 15.7 (*)    Neutro Abs 8.6 (*)    All other components within normal limits  COMPREHENSIVE METABOLIC PANEL - Abnormal; Notable for the following components:   Potassium 3.4 (*)    Chloride 98 (*)    Glucose, Bld 115 (*)    Calcium 8.8 (*)    All other components within normal limits  URIC ACID - Abnormal; Notable for the following components:   Uric Acid, Serum 10.6 (*)    All other components within normal limits   ____________________________________________   ____________________________________________  RADIOLOGY  X-ray of the left foot is negative for acute abnormality  ____________________________________________   PROCEDURES  Procedure(s) performed: No  Procedures    ____________________________________________   INITIAL IMPRESSION / ASSESSMENT AND PLAN / ED COURSE  Pertinent labs & imaging results that were available  during my care of the patient were reviewed by me and considered in my medical decision making (see chart for details).  Patient is 52 year old female presents emergency department complaining of left foot pain.  On physical exam the left foot has some redness and tenderness at the distal portion of the left great toe that extends up to the midfoot.  The area is also tender to palpation.  Patient has a lot of hard thick calluses that are cracked on her entire foot.  There is a yellowish tinge to her toenails.  When questioned about fluid pill use the patient states she was recently switched off of Lasix to another medication.  Concerns of gout.  Patient was also concerned that she had gotten up in the middle the night and had injured her foot.  Labs and x-ray are ordered  Clinical Course as of Feb 24 1354  Thu Feb 24, 2018  1345 Uric Acid, Serum(!): 10.6 [SF]  1345 Uric acid(!) [SF]  1345 Uric Acid, Serum(!): 10.6 [SF]  1345 Uric Acid, Serum(!): 10.6 [SF]    Clinical Course User Index [SF] Caryn Section Linden Dolin, PA-C   CBC shows a decreased hemoglobin and hematocrit.  Concerns of GI bleed.  Rectal exam with Hemoccult is negative.  Uric acid is elevated at 10.6.  Her potassium is low.  Discussed test results with patient.  X-ray of the foot was also negative.  She will be given a prescription for colchicine, Percocet, potassium 10 M EQ's daily.  She should follow-up with her regular doctor due to the hemoglobin and hematocrit being low.  She states she has an appointment on 1 April.  They should recheck her hemoglobin and hematocrit at that time to make sure it is not decreasing more.  The patient states she understands.  She was discharged in stable condition  As part of my medical decision making, I reviewed the following data within the [place] notes reviewed and incorporated, Labs reviewed as noted above, Old chart reviewed, Radiograph reviewed x-ray of the foot is  negative, Notes from prior ED visits and [place]  Controlled Substance Database  ____________________________________________   FINAL CLINICAL IMPRESSION(S) / ED DIAGNOSES  Final diagnoses:  Acute idiopathic gout, unspecified site  Hypokalemia  Other iron deficiency anemia      NEW MEDICATIONS STARTED DURING THIS VISIT:  New Prescriptions   ALLOPURINOL (ZYLOPRIM) 300 MG TABLET    Take 1 tablet (300 mg total) by mouth daily.   COLCHICINE 0.6 MG TABLET    Take 1 tablet (0.6 mg total) by mouth daily.   METHYLPREDNISOLONE (MEDROL DOSEPAK) 4 MG TBPK TABLET    Take 6 pills on day one then decrease by 1 pill each day   OXYCODONE-ACETAMINOPHEN (PERCOCET/ROXICET) 5-325 MG TABLET    Take 1 tablet by mouth every 4 (four) hours as needed for severe pain.   POTASSIUM CHLORIDE (K-DUR) 10 MEQ TABLET    Take 1 tablet (10 mEq total) by mouth daily.     Note:  This document was prepared using Dragon voice recognition software and may include unintentional dictation errors.    Versie Starks, PA-C 02/24/18 1358    Lavonia Drafts, MD 02/24/18 [phone]

## 2018-02-24 NOTE — ED Notes (Signed)
See triage note  Presents with left foot pain which started 2 days ago  Pain is mainly from great toe and moving into foot  No injury or swelling noted  Good pulses

## 2018-02-24 NOTE — Discharge Instructions (Addendum)
Follow-up with your regular doctor on April 1.  Have them recheck your CBC.  Your potassium is also low today.  He will be started on potassium medication.  Be sure to inform your doctor of this change in your medications.  You are being given a prescription for colchicine.  This medication is to help lower the uric acid in your body which helps with gout.  Pain medication has been prescribed for you.  Also a steroid medication.  I do not want to put you on an anti-inflammatory due to the concerns of the lowered hemoglobin and hematocrit.  This will decrease inflammation in your foot and should start to help with the pain.

## 2018-02-24 NOTE — ED Triage Notes (Signed)
FIRST NURSE NOTE-came EMS for toe pain. Looks WNL per EMS.  NAD

## 2018-02-24 NOTE — ED Notes (Signed)
Charge RN note: Pt c/o great toe pain, no acute distress noted, to lobby via EMS.

## 2018-02-24 NOTE — ED Triage Notes (Signed)
Pt c/o left foot pain that started 2 days ago - she denies injury - states she is unable to walk on the left foot

## 2018-03-01 DIAGNOSIS — M10272 Drug-induced gout, left ankle and foot: Secondary | ICD-10-CM

## 2018-03-01 HISTORY — DX: Drug-induced gout, left ankle and foot: M10.272

## 2018-04-22 ENCOUNTER — Encounter: Payer: Self-pay | Admitting: Emergency Medicine

## 2018-04-22 ENCOUNTER — Other Ambulatory Visit: Payer: Self-pay

## 2018-04-22 ENCOUNTER — Inpatient Hospital Stay
Admission: EM | Admit: 2018-04-22 | Discharge: 2018-04-25 | DRG: 309 | Disposition: A | Discharge status: Home / Self Care | Payer: Medicaid Other | Attending: Internal Medicine | Admitting: Internal Medicine

## 2018-04-22 DIAGNOSIS — I272 Pulmonary hypertension, unspecified: Secondary | ICD-10-CM | POA: Diagnosis present

## 2018-04-22 DIAGNOSIS — I5032 Chronic diastolic (congestive) heart failure: Secondary | ICD-10-CM | POA: Diagnosis present

## 2018-04-22 DIAGNOSIS — Z9119 Patient's noncompliance with other medical treatment and regimen: Secondary | ICD-10-CM

## 2018-04-22 DIAGNOSIS — M797 Fibromyalgia: Secondary | ICD-10-CM | POA: Diagnosis present

## 2018-04-22 DIAGNOSIS — K219 Gastro-esophageal reflux disease without esophagitis: Secondary | ICD-10-CM | POA: Diagnosis present

## 2018-04-22 DIAGNOSIS — I110 Hypertensive heart disease with heart failure: Secondary | ICD-10-CM | POA: Diagnosis present

## 2018-04-22 DIAGNOSIS — F419 Anxiety disorder, unspecified: Secondary | ICD-10-CM | POA: Diagnosis present

## 2018-04-22 DIAGNOSIS — R0602 Shortness of breath: Secondary | ICD-10-CM

## 2018-04-22 DIAGNOSIS — F17200 Nicotine dependence, unspecified, uncomplicated: Secondary | ICD-10-CM | POA: Diagnosis present

## 2018-04-22 DIAGNOSIS — Z7901 Long term (current) use of anticoagulants: Secondary | ICD-10-CM

## 2018-04-22 DIAGNOSIS — Z886 Allergy status to analgesic agent status: Secondary | ICD-10-CM | POA: Diagnosis not present

## 2018-04-22 DIAGNOSIS — Z808 Family history of malignant neoplasm of other organs or systems: Secondary | ICD-10-CM | POA: Diagnosis not present

## 2018-04-22 DIAGNOSIS — I482 Chronic atrial fibrillation: Secondary | ICD-10-CM | POA: Diagnosis present

## 2018-04-22 DIAGNOSIS — R0902 Hypoxemia: Secondary | ICD-10-CM

## 2018-04-22 DIAGNOSIS — J449 Chronic obstructive pulmonary disease, unspecified: Secondary | ICD-10-CM | POA: Diagnosis present

## 2018-04-22 DIAGNOSIS — Z9089 Acquired absence of other organs: Secondary | ICD-10-CM

## 2018-04-22 DIAGNOSIS — Z885 Allergy status to narcotic agent status: Secondary | ICD-10-CM | POA: Diagnosis not present

## 2018-04-22 DIAGNOSIS — Z825 Family history of asthma and other chronic lower respiratory diseases: Secondary | ICD-10-CM | POA: Diagnosis not present

## 2018-04-22 DIAGNOSIS — I051 Rheumatic mitral insufficiency: Secondary | ICD-10-CM | POA: Diagnosis present

## 2018-04-22 DIAGNOSIS — Z8249 Family history of ischemic heart disease and other diseases of the circulatory system: Secondary | ICD-10-CM

## 2018-04-22 DIAGNOSIS — Z96651 Presence of right artificial knee joint: Secondary | ICD-10-CM | POA: Diagnosis present

## 2018-04-22 DIAGNOSIS — I4891 Unspecified atrial fibrillation: Secondary | ICD-10-CM | POA: Diagnosis present

## 2018-04-22 DIAGNOSIS — R062 Wheezing: Secondary | ICD-10-CM

## 2018-04-22 DIAGNOSIS — Z888 Allergy status to other drugs, medicaments and biological substances status: Secondary | ICD-10-CM | POA: Diagnosis not present

## 2018-04-22 LAB — COMPREHENSIVE METABOLIC PANEL
ALBUMIN: 3.7 g/dL (ref 3.5–5.0)
ALK PHOS: 100 U/L (ref 38–126)
ALT: 25 U/L (ref 14–54)
ANION GAP: 10 (ref 5–15)
AST: 39 U/L (ref 15–41)
BUN: 13 mg/dL (ref 6–20)
CHLORIDE: 99 mmol/L — AB (ref 101–111)
CO2: 27 mmol/L (ref 22–32)
Calcium: 8.3 mg/dL — ABNORMAL LOW (ref 8.9–10.3)
Creatinine, Ser: 0.75 mg/dL (ref 0.44–1.00)
GFR calc non Af Amer: >60 mL/min (ref >60)
GLUCOSE: 122 mg/dL — AB (ref 65–99)
Potassium: 3.8 mmol/L (ref 3.5–5.1)
Sodium: 136 mmol/L (ref 135–145)
Total Bilirubin: 0.6 mg/dL (ref 0.3–1.2)
Total Protein: 7.8 g/dL (ref 6.5–8.1)

## 2018-04-22 LAB — TROPONIN I
Troponin I: <0.03 ng/mL (ref <0.03)
Troponin I: <0.03 ng/mL (ref <0.03)
Troponin I: <0.03 ng/mL (ref <0.03)

## 2018-04-22 LAB — CBC
HCT: 37.9 % (ref 35.0–47.0)
HEMOGLOBIN: 12.5 g/dL (ref 12.0–16.0)
MCH: 26.4 pg (ref 26.0–34.0)
MCHC: 33.1 g/dL (ref 32.0–36.0)
MCV: 79.7 fL — ABNORMAL LOW (ref 80.0–100.0)
Platelets: 238 10*3/uL (ref 150–440)
RBC: 4.75 MIL/uL (ref 3.80–5.20)
RDW: 19.8 % — AB (ref 11.5–14.5)
WBC: 8.7 10*3/uL (ref 3.6–11.0)

## 2018-04-22 LAB — GLUCOSE, CAPILLARY: GLUCOSE-CAPILLARY: 144 mg/dL — AB (ref 65–99)

## 2018-04-22 LAB — APTT

## 2018-04-22 LAB — PROTIME-INR
INR: 1.02
PROTHROMBIN TIME: 13.3 s (ref 11.4–15.2)

## 2018-04-22 LAB — HEPARIN LEVEL (UNFRACTIONATED): Heparin Unfractionated: >3.6 IU/mL — ABNORMAL HIGH (ref 0.30–0.70)

## 2018-04-22 MED ORDER — SODIUM CHLORIDE 0.9 % IV BOLUS
1000.0000 mL | Freq: Once | INTRAVENOUS | Status: AC
  Administered 2018-04-22: 1000 mL via INTRAVENOUS

## 2018-04-22 MED ORDER — ONDANSETRON HCL 4 MG PO TABS: 4.0000 mg | ORAL_TABLET | Freq: Four times a day (QID) | ORAL | Status: DC | PRN

## 2018-04-22 MED ORDER — DOCUSATE SODIUM 100 MG PO CAPS
100.0000 mg | ORAL_CAPSULE | Freq: Two times a day (BID) | ORAL | Status: DC
  Administered 2018-04-22 – 2018-04-25 (×6): 100 mg via ORAL
  Filled 2018-04-22 (×6): qty 1

## 2018-04-22 MED ORDER — LOSARTAN POTASSIUM 25 MG PO TABS: 25.0000 mg | ORAL_TABLET | Freq: Every day | ORAL | Status: DC

## 2018-04-22 MED ORDER — FUROSEMIDE 40 MG PO TABS
80.0000 mg | ORAL_TABLET | Freq: Every day | ORAL | Status: DC
  Administered 2018-04-23 – 2018-04-25 (×3): 80 mg via ORAL
  Filled 2018-04-22 (×3): qty 2

## 2018-04-22 MED ORDER — DILTIAZEM HCL 25 MG/5ML IV SOLN
10.0000 mg | Freq: Once | INTRAVENOUS | Status: AC
  Administered 2018-04-22: 10 mg via INTRAVENOUS
  Filled 2018-04-22: qty 5

## 2018-04-22 MED ORDER — DIPHENHYDRAMINE-APAP (SLEEP) 25-500 MG PO TABS: 2.0000 | ORAL_TABLET | Freq: Every evening | ORAL | Status: DC | PRN

## 2018-04-22 MED ORDER — FENTANYL CITRATE (PF) 100 MCG/2ML IJ SOLN
50.0000 ug | Freq: Once | INTRAMUSCULAR | Status: AC
  Administered 2018-04-22: 50 ug via INTRAVENOUS
  Filled 2018-04-22: qty 2

## 2018-04-22 MED ORDER — SODIUM CHLORIDE 0.9 % IV BOLUS
500.0000 mL | Freq: Once | INTRAVENOUS | Status: AC
  Administered 2018-04-22: 500 mL via INTRAVENOUS

## 2018-04-22 MED ORDER — ALLOPURINOL 300 MG PO TABS
300.0000 mg | ORAL_TABLET | Freq: Every day | ORAL | Status: DC
  Administered 2018-04-24 – 2018-04-25 (×2): 300 mg via ORAL
  Filled 2018-04-22 (×4): qty 1

## 2018-04-22 MED ORDER — DILTIAZEM HCL 100 MG IV SOLR
12.0000 mg/h | Freq: Once | INTRAVENOUS | Status: AC
  Administered 2018-04-22: 12 mg/h via INTRAVENOUS
  Filled 2018-04-22: qty 100

## 2018-04-22 MED ORDER — ONDANSETRON HCL 4 MG/2ML IJ SOLN
4.0000 mg | Freq: Four times a day (QID) | INTRAMUSCULAR | Status: DC | PRN
  Administered 2018-04-23 (×2): 4 mg via INTRAVENOUS
  Filled 2018-04-22 (×2): qty 2

## 2018-04-22 MED ORDER — COLCHICINE 0.6 MG PO TABS
0.6000 mg | ORAL_TABLET | Freq: Every day | ORAL | Status: DC
  Administered 2018-04-24 – 2018-04-25 (×2): 0.6 mg via ORAL
  Filled 2018-04-22 (×2): qty 1

## 2018-04-22 MED ORDER — ACETAMINOPHEN 650 MG RE SUPP: 650.0000 mg | Freq: Four times a day (QID) | RECTAL | Status: DC | PRN

## 2018-04-22 MED ORDER — MOMETASONE FURO-FORMOTEROL FUM 200-5 MCG/ACT IN AERO
2.0000 | INHALATION_SPRAY | Freq: Two times a day (BID) | RESPIRATORY_TRACT | Status: DC
  Administered 2018-04-22 – 2018-04-25 (×6): 2 via RESPIRATORY_TRACT
  Filled 2018-04-22: qty 8.8

## 2018-04-22 MED ORDER — DILTIAZEM HCL 25 MG/5ML IV SOLN: 15.0000 mg | Freq: Once | INTRAVENOUS | Status: DC

## 2018-04-22 MED ORDER — BISACODYL 5 MG PO TBEC: 5.0000 mg | DELAYED_RELEASE_TABLET | Freq: Every day | ORAL | Status: DC | PRN

## 2018-04-22 MED ORDER — POTASSIUM CHLORIDE CRYS ER 10 MEQ PO TBCR
10.0000 meq | EXTENDED_RELEASE_TABLET | Freq: Every day | ORAL | Status: DC
  Administered 2018-04-23 – 2018-04-25 (×3): 10 meq via ORAL
  Filled 2018-04-22 (×3): qty 1

## 2018-04-22 MED ORDER — CARVEDILOL 6.25 MG PO TABS
3.1250 mg | ORAL_TABLET | Freq: Two times a day (BID) | ORAL | Status: DC
  Administered 2018-04-22 – 2018-04-23 (×2): 3.125 mg via ORAL
  Filled 2018-04-22 (×3): qty 1

## 2018-04-22 MED ORDER — VORTIOXETINE HBR 5 MG PO TABS
10.0000 mg | ORAL_TABLET | Freq: Every day | ORAL | Status: DC | PRN
  Filled 2018-04-22: qty 2

## 2018-04-22 MED ORDER — DIPHENHYDRAMINE HCL 25 MG PO CAPS: 50.0000 mg | ORAL_CAPSULE | Freq: Every evening | ORAL | Status: DC | PRN

## 2018-04-22 MED ORDER — RIVAROXABAN 20 MG PO TABS
20.0000 mg | ORAL_TABLET | Freq: Every day | ORAL | Status: DC
  Administered 2018-04-22 – 2018-04-24 (×3): 20 mg via ORAL
  Filled 2018-04-22 (×3): qty 1

## 2018-04-22 MED ORDER — OXYCODONE-ACETAMINOPHEN 5-325 MG PO TABS
1.0000 | ORAL_TABLET | ORAL | Status: DC | PRN
  Administered 2018-04-22 – 2018-04-25 (×14): 1 via ORAL
  Filled 2018-04-22 (×14): qty 1

## 2018-04-22 MED ORDER — ACETAMINOPHEN 500 MG PO TABS: 1000.0000 mg | ORAL_TABLET | Freq: Every evening | ORAL | Status: DC | PRN

## 2018-04-22 MED ORDER — HEPARIN BOLUS VIA INFUSION
4000.0000 [IU] | Freq: Once | INTRAVENOUS | Status: DC
  Filled 2018-04-22: qty 4000

## 2018-04-22 MED ORDER — DILTIAZEM HCL 25 MG/5ML IV SOLN
10.0000 mg | Freq: Once | INTRAVENOUS | Status: AC
  Administered 2018-04-22: 10 mg via INTRAVENOUS

## 2018-04-22 MED ORDER — IPRATROPIUM-ALBUTEROL 0.5-2.5 (3) MG/3ML IN SOLN: 3.0000 mL | Freq: Four times a day (QID) | RESPIRATORY_TRACT | Status: DC | PRN

## 2018-04-22 MED ORDER — DILTIAZEM HCL 100 MG IV SOLR
5.0000 mg/h | Freq: Once | INTRAVENOUS | Status: AC
  Administered 2018-04-22: 5 mg/h via INTRAVENOUS
  Filled 2018-04-22: qty 100

## 2018-04-22 MED ORDER — ONDANSETRON HCL 4 MG/2ML IJ SOLN
4.0000 mg | Freq: Once | INTRAMUSCULAR | Status: AC
  Administered 2018-04-22: 4 mg via INTRAVENOUS
  Filled 2018-04-22: qty 2

## 2018-04-22 MED ORDER — ZOLPIDEM TARTRATE 5 MG PO TABS
5.0000 mg | ORAL_TABLET | Freq: Every evening | ORAL | Status: DC | PRN
  Filled 2018-04-22: qty 1

## 2018-04-22 MED ORDER — ACETAMINOPHEN 325 MG PO TABS
650.0000 mg | ORAL_TABLET | Freq: Four times a day (QID) | ORAL | Status: DC | PRN
  Administered 2018-04-23: 650 mg via ORAL
  Filled 2018-04-22: qty 2

## 2018-04-22 MED ORDER — ALBUTEROL SULFATE (2.5 MG/3ML) 0.083% IN NEBU
2.5000 mg | INHALATION_SOLUTION | Freq: Four times a day (QID) | RESPIRATORY_TRACT | Status: DC | PRN
  Administered 2018-04-24 – 2018-04-25 (×2): 2.5 mg via RESPIRATORY_TRACT
  Filled 2018-04-22 (×2): qty 3

## 2018-04-22 MED ORDER — SUCRALFATE 1 G PO TABS
1.0000 g | ORAL_TABLET | Freq: Three times a day (TID) | ORAL | Status: DC
  Administered 2018-04-22 – 2018-04-25 (×11): 1 g via ORAL
  Filled 2018-04-22 (×11): qty 1

## 2018-04-22 MED ORDER — PANTOPRAZOLE SODIUM 40 MG PO TBEC
40.0000 mg | DELAYED_RELEASE_TABLET | Freq: Every day | ORAL | Status: DC
  Administered 2018-04-23 – 2018-04-25 (×3): 40 mg via ORAL
  Filled 2018-04-22 (×3): qty 1

## 2018-04-22 MED ORDER — HEPARIN (PORCINE) IN NACL 100-0.45 UNIT/ML-% IJ SOLN
1250.0000 [IU]/h | INTRAMUSCULAR | Status: DC
  Filled 2018-04-22: qty 250

## 2018-04-22 MED ORDER — GABAPENTIN 300 MG PO CAPS
600.0000 mg | ORAL_CAPSULE | Freq: Three times a day (TID) | ORAL | Status: DC
  Administered 2018-04-22 – 2018-04-25 (×9): 600 mg via ORAL
  Filled 2018-04-22 (×9): qty 2

## 2018-04-22 NOTE — Progress Notes (Signed)
ANTICOAGULATION CONSULT NOTE - Initial Consult  Pharmacy Consult for heparin gtt Indication: atrial fibrillation  Allergies  Allergen Reactions  . Aspirin Swelling  . Flexeril [Cyclobenzaprine] Swelling  . Morphine And Related Swelling  . Trazamine [Trazodone & Diet Manage Prod] Nausea And Vomiting  . Codeine Rash  . Tramadol Rash    Patient Measurements: Weight: 269 lb (122 kg) Heparin Dosing Weight: 84.6kg   Vital Signs: BP: 155/136 (05/24 1430) Pulse Rate: 65 (05/24 1430)  Labs: Recent Labs    04/22/18 1137  HGB 12.5  HCT 37.9  PLT 238  CREATININE 0.75  TROPONINI <0.03    Estimated Creatinine Clearance: 108.4 mL/min (by C-G formula based on SCr of 0.75 mg/dL).   Medical History: Past Medical History:  Diagnosis Date  . Allergy    seasonal  . Anxiety   . Arthritis    Right Knee  . Asthma   . CHF (congestive heart failure) ([place])   . COPD (chronic obstructive pulmonary disease) ([place])   . Coronary artery disease    Leaky heart valve  . Fibromyalgia   . GERD (gastroesophageal reflux disease)   . Hypertension   . Pneumonia   . PUD (peptic ulcer disease)   . Pulmonary HTN ([place])   . Rheumatic fever/heart disease   . Sleep apnea     Medications:   (Not in a hospital admission) Scheduled:  . allopurinol  300 mg Oral Daily  . carvedilol  3.125 mg Oral BID WC  . colchicine  0.6 mg Oral Daily  . diltiazem  15 mg Intravenous Once  . docusate sodium  100 mg Oral BID  . furosemide  80 mg Oral Daily  . gabapentin  600 mg Oral TID  . heparin  4,000 Units Intravenous Once  . losartan  25 mg Oral Daily  . mometasone-formoterol  2 puff Inhalation BID  . pantoprazole  40 mg Oral Daily  . potassium chloride  10 mEq Oral Daily  . zolpidem  10 mg Oral QHS   Infusions:  . diltiazem (CARDIZEM) infusion    . heparin     PRN: acetaminophen **OR** acetaminophen, albuterol, bisacodyl, diphenhydramine-acetaminophen, ipratropium-albuterol, ondansetron **OR**  ondansetron (ZOFRAN) IV, oxyCODONE-acetaminophen, vortioxetine HBr Anti-infectives (From admission, onward)   None      Assessment: 52 year old female with afib, quit taking her eliquis 7 days ago per patient to Dr Vianne Bulls. Pharmacy consulted to start heparin gtt for afib per pharmacy protocol.   Goal of Therapy:  Heparin level 0.3-0.7 units/ml Monitor platelets by anticoagulation protocol: Yes   Plan:  Give 4000 units bolus x 1 Start heparin infusion at 1250 units/hr Check anti-Xa level in 6 hours and daily while on heparin Continue to monitor H&H and platelets  Chelsea Ramsey Chelsea Ramsey 04/22/2018,2:46 PM

## 2018-04-22 NOTE — Consult Note (Signed)
[facility] Cardiology  CARDIOLOGY CONSULT NOTE  Patient ID: Chelsea Ramsey MRN: [id number] DOB/AGE: May 21, 1966 52 y.o.  Admit date: 04/22/2018 Referring Physician Vianne Bulls Primary Physician [facility] Primary Cardiologist Bobby Rumpf [facility] ) Reason for Consultation atrial fibrillation  HPI: 52 year old female referred for of atrial fibrillation.  The patient has known history of COPD, mild to moderate pulmonary hypertension, moderate mitral regurgitation with mild mitral stenosis, and recent diagnosis of atrial fibrillation, status post cardioversion 02/09/2018, followed at [facility].  Recent 2D echocardiogram 02/10/2018 revealed normal left ventricular function.  PET myocardial perfusion study revealed LVEF of 56%, with subtle apical defect without definitive evidence for scar or ischemia.  Patient presents to [facility] emergency room with 1 to 2-week history of more frequent palpitations particularly over the past 1 to 2 days.  In the emergency room she was noted to be in atrial fibrillation with rapid ventricular rate of 160 bpm.  The patient has been noncompliant, been off of Eliquis for over a week.  Patient claims that the Eliquis made her cold is the reason why she discontinued on her own.  Review of systems complete and found to be negative unless listed above     Past Medical History:  Diagnosis Date  . Allergy    seasonal  . Anxiety   . Arthritis    Right Knee  . Asthma   . CHF (congestive heart failure) ([place])   . COPD (chronic obstructive pulmonary disease) ([place])   . Coronary artery disease    Leaky heart valve  . Fibromyalgia   . GERD (gastroesophageal reflux disease)   . Hypertension   . Pneumonia   . PUD (peptic ulcer disease)   . Pulmonary HTN ([place])   . Rheumatic fever/heart disease   . Sleep apnea     Past Surgical History:  Procedure Laterality Date  . ADENOIDECTOMY    . MULTIPLE TOOTH EXTRACTIONS    . TONSILLECTOMY    . TOTAL KNEE ARTHROPLASTY Right 09/04/2016   Procedure: TOTAL KNEE ARTHROPLASTY; with lateral release;  Surgeon: Earlie Server, MD;  Location: [facility];  Service: Orthopedics;  Laterality: Right;    Medications Prior to Admission  Medication Sig Dispense Refill Last Dose  . allopurinol (ZYLOPRIM) 300 MG tablet Take 1 tablet (300 mg total) by mouth daily. 30 tablet 2   . carvedilol (COREG) 3.125 MG tablet Take 3.125 mg by mouth 2 (two) times daily with a meal.    12/20/2017 at 0900  . colchicine 0.6 MG tablet Take 1 tablet (0.6 mg total) by mouth daily. 30 tablet 2   . furosemide (LASIX) 80 MG tablet Take 80 mg by mouth.   12/20/2017 at 0900  . gabapentin (NEURONTIN) 300 MG capsule Take 600 mg by mouth 3 (three) times daily.    12/20/2017 at 0900  . losartan (COZAAR) 25 MG tablet Take 25 mg by mouth daily.   12/20/2017 at 0900  . omeprazole (PRILOSEC) 20 MG capsule Take 40 mg by mouth 2 (two) times daily.   12/20/2017 at 0900  . albuterol (PROVENTIL HFA;VENTOLIN HFA) 108 (90 Base) MCG/ACT inhaler Inhale 2 puffs into the lungs every 6 (six) hours as needed for shortness of breath.   prn at prn  . ARTIFICIAL TEAR OP Apply 2 drops to eye daily as needed (dry eyes).   prn at prn  . budesonide-formoterol (SYMBICORT) 160-4.5 MCG/ACT inhaler Inhale 2 puffs into the lungs 2 (two) times daily.   prn at prn  . diphenhydramine-acetaminophen (TYLENOL PM) 25-500  MG TABS tablet Take 2 tablets by mouth at bedtime as needed (sleep).    prn at prn  . ipratropium-albuterol (DUONEB) 0.5-2.5 (3) MG/3ML SOLN Inhale 3 mL by nebulization twice daily.   prn at prn  . methylPREDNISolone (MEDROL DOSEPAK) 4 MG TBPK tablet Take 6 pills on day one then decrease by 1 pill each day (Patient not taking: Reported on 04/22/2018) 21 tablet 0 Not Taking at Unknown time  . oxyCODONE-acetaminophen (PERCOCET/ROXICET) 5-325 MG tablet Take 1 tablet by mouth every 4 (four) hours as needed for severe pain. (Patient not taking: Reported on 04/22/2018) 15 tablet 0 Not Taking at Unknown time  .  potassium chloride (K-DUR) 10 MEQ tablet Take 1 tablet (10 mEq total) by mouth daily. (Patient not taking: Reported on 04/22/2018) 30 tablet 0 Not Taking at Unknown time  . vortioxetine HBr (TRINTELLIX) 10 MG TABS Take 10 mg by mouth daily as needed (mood swings /anxiety).   prn at prn  . zolpidem (AMBIEN) 10 MG tablet Take 10 mg by mouth at bedtime.   prn at prn   Social History   Socioeconomic History  . Marital status: Married    Spouse name: Not on file  . Number of children: Not on file  . Years of education: Not on file  . Highest education level: Not on file  Occupational History  . Not on file  Social Needs  . Financial resource strain: Not on file  . Food insecurity:    Worry: Not on file    Inability: Not on file  . Transportation needs:    Medical: Not on file    Non-medical: Not on file  Tobacco Use  . Smoking status: Current Some Day Smoker    Packs/day: 0.50    Types: Cigarettes  . Smokeless tobacco: Never Used  Substance and Sexual Activity  . Alcohol use: Yes    Alcohol/week: 1.8 oz    Types: 3 Cans of beer per week    Comment: 16 oz per week  . 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 Sexual activity: Not on file  Lifestyle  . Physical activity:    Days per week: Not on file    Minutes per session: Not on file  . Stress: Not on file  Relationships  . Social connections:    Talks on phone: Not on file    Gets together: Not on file    Attends religious service: Not on file    Active member of club or organization: Not on file    Attends meetings of clubs or organizations: Not on file    Relationship status: Not on file  . Intimate partner violence:    Fear of current or ex partner: Not on file    Emotionally abused: Not on file    Physically abused: Not on file    Forced sexual activity: Not on file  Other Topics Concern  . Not on file  Social History Narrative  . Not on file    Family History   Problem Relation Age of Onset  . COPD Mother   . Cancer Mother        Bone  . Asthma Mother   . Congestive Heart Failure Father       Review of systems complete and found to be negative unless listed above      PHYSICAL EXAM  General: Well developed, well nourished, in no  acute distress HEENT:  Normocephalic and atramatic Neck:  No JVD.  Lungs: Clear bilaterally to auscultation and percussion. Heart: HRRR . Normal S1 and S2 without gallops or murmurs.  Abdomen: Bowel sounds are positive, abdomen soft and non-tender  Msk:  Back normal, normal gait. Normal strength and tone for age. Extremities: No clubbing, cyanosis or edema.   Neuro: Alert and oriented X 3. Psych:  Good affect, responds appropriately  Labs:   Lab Results  Component Value Date   WBC 8.7 04/22/2018   HGB 12.5 04/22/2018   HCT 37.9 04/22/2018   MCV 79.7 (L) 04/22/2018   PLT 238 04/22/2018    Recent Labs  Lab 04/22/18 1137  NA 136  K 3.8  CL 99*  CO2 27  BUN 13  CREATININE 0.75  CALCIUM 8.3*  PROT 7.8  BILITOT 0.6  ALKPHOS 100  ALT 25  AST 39  GLUCOSE 122*   Lab Results  Component Value Date   CKTOTAL 39 12/17/2013   CKMB 0.9 03/28/2013   TROPONINI <0.03 04/22/2018    Lab Results  Component Value Date   CHOL 96 04/03/2014   CHOL 91 12/09/2013   CHOL 145 06/02/2012   Lab Results  Component Value Date   HDL 32 (A) 04/03/2014   HDL 31 (L) 12/09/2013   HDL 66 (H) 06/02/2012   Lab Results  Component Value Date   LDLCALC 51 04/03/2014   LDLCALC 50 12/09/2013   LDLCALC 58 06/02/2012   Lab Results  Component Value Date   TRIG 63 04/03/2014   TRIG 50 12/09/2013   TRIG 106 06/02/2012   No results found for: CHOLHDL No results found for: LDLDIRECT    Radiology: No results found.  EKG: Atrial fibrillation with rapid ventricular rate  ASSESSMENT AND PLAN:   1.  Recurrent atrial fibrillation with rapid ventricular rate, likely due to medical noncompliance, been off Eliquis  for over a week. 2.  COPD/mild to moderate pulmonary hypertension 3.  Moderate mitral regurgitation, mild mitral stenosis  Recommendations  1.  Agree with overall current therapy 2.  Cardizem drip, uptitrate as needed to control ventricular rate 3.  Continue carvedilol 4.  Start Xarelto 20 mg daily 5.  No further cardiac diagnostics at this time  Signed: Isaias Cowman MD,PhD, [facility] 04/22/2018, 3:32 PM

## 2018-04-22 NOTE — ED Provider Notes (Signed)
[facility] Emergency Department Provider Note  Time seen: 12:13 PM  I have reviewed the triage vital signs and the nursing notes.   HISTORY  Chief Complaint Chest Pain    HPI Chelsea Ramsey is a 52 y.o. female with a past medical history of anxiety, CHF, COPD, fibromyalgia, gastric reflux, presents to the emergency department with a rapid heart rate chest pain and nausea.  According to the patient approximately 1 month ago she was diagnosed with atrial fibrillation, but states they shocked her heart out of it.  That was the patient's first time she had ever experienced atrial fibrillation.  She states for the past 1 week she has been experience a more frequent palpitations and a heart rate around 110-120 per patient.  States she has been intermittently dizzy.  She states for the past 2 days she has had chest pain described as moderate pressure to the center of her chest along with a rapid heart rate nausea and diaphoresis.  Patient states today she is feeling very dizzy and lightheaded, upon arrival patient has a pulse around 160 bpm appears to be in atrial fibrillation.  Patient states shortness of breath but only with exertion.  Currently lying in bed patient states she feels anxious and dizzy with continued moderate central chest pain.   Past Medical History:  Diagnosis Date  . Allergy    seasonal  . Anxiety   . Arthritis    Right Knee  . Asthma   . CHF (congestive heart failure) ([place])   . COPD (chronic obstructive pulmonary disease) ([place])   . Coronary artery disease    Leaky heart valve  . Fibromyalgia   . GERD (gastroesophageal reflux disease)   . Hypertension   . Pneumonia   . PUD (peptic ulcer disease)   . Pulmonary HTN ([place])   . Rheumatic fever/heart disease   . Sleep apnea     Patient Active Problem List   Diagnosis Date Noted  . COPD (chronic obstructive pulmonary disease) ([place]) 12/21/2017  . COPD (chronic obstructive pulmonary disease)  with acute bronchitis ([place]) 12/20/2017  . Primary localized osteoarthritis of right knee 09/04/2016    Past Surgical History:  Procedure Laterality Date  . ADENOIDECTOMY    . MULTIPLE TOOTH EXTRACTIONS    . TONSILLECTOMY    . TOTAL KNEE ARTHROPLASTY Right 09/04/2016   Procedure: TOTAL KNEE ARTHROPLASTY; with lateral release;  Surgeon: Earlie Server, MD;  Location: [facility];  Service: Orthopedics;  Laterality: Right;    Prior to Admission medications   Medication Sig Start Date End Date Taking? Authorizing Provider  albuterol (PROVENTIL HFA;VENTOLIN HFA) 108 (90 Base) MCG/ACT inhaler Inhale 2 puffs into the lungs every 6 (six) hours as needed for shortness of breath. 02/26/16   [provider]  allopurinol (ZYLOPRIM) 300 MG tablet Take 1 tablet (300 mg total) by mouth daily. 02/24/18   Fisher, Linden Dolin, PA-C  ARTIFICIAL TEAR OP Apply 2 drops to eye daily as needed (dry eyes).    [provider]  budesonide-formoterol (SYMBICORT) 160-4.5 MCG/ACT inhaler Inhale 2 puffs into the lungs 2 (two) times daily. 02/26/16   [provider]  carvedilol (COREG) 3.125 MG tablet Take 3.125 mg by mouth 2 (two) times daily with a meal.  02/12/15   [provider]  colchicine 0.6 MG tablet Take 1 tablet (0.6 mg total) by mouth daily. 02/24/18 02/24/19  Fisher, Linden Dolin, PA-C  diphenhydramine-acetaminophen (TYLENOL PM) 25-500 MG TABS tablet Take 2 tablets by  mouth at bedtime as needed (sleep).     [provider]  furosemide (LASIX) 80 MG tablet Take 80 mg by mouth. 07/31/15   [provider]  gabapentin (NEURONTIN) 300 MG capsule Take 600 mg by mouth 3 (three) times daily.  07/22/15   [provider]  ipratropium-albuterol (DUONEB) 0.5-2.5 (3) MG/3ML SOLN Inhale 3 mL by nebulization twice daily. 02/26/16 12/20/18  [provider]  losartan (COZAAR) 25 MG tablet Take 25 mg by mouth daily.    [provider]  methylPREDNISolone (MEDROL DOSEPAK) 4  MG TBPK tablet Take 6 pills on day one then decrease by 1 pill each day 02/24/18   Versie Starks, PA-C  nicotine (RA NICOTINE) 7 mg/24hr patch Place 7 mg onto the skin daily.  12/23/15   [provider]  omeprazole (PRILOSEC) 20 MG capsule Take 40 mg by mouth 2 (two) times daily. 02/18/16   [provider]  oxyCODONE-acetaminophen (PERCOCET/ROXICET) 5-325 MG tablet Take 1 tablet by mouth every 4 (four) hours as needed for severe pain. 02/24/18   Fisher, Linden Dolin, PA-C  potassium chloride (K-DUR) 10 MEQ tablet Take 1 tablet (10 mEq total) by mouth daily. 02/24/18   Fisher, Linden Dolin, PA-C  vortioxetine HBr (TRINTELLIX) 10 MG TABS Take 10 mg by mouth daily as needed (mood swings /anxiety).    [provider]  zolpidem (AMBIEN) 10 MG tablet Take 10 mg by mouth at bedtime.    [provider]    Allergies  Allergen Reactions  . Aspirin Swelling  . Flexeril [Cyclobenzaprine] Swelling  . Morphine And Related Swelling  . Trazamine [Trazodone & Diet Manage Prod] Nausea And Vomiting  . Codeine Rash  . Tramadol Rash    Family History  Problem Relation Age of Onset  . COPD Mother   . Cancer Mother        Bone  . Asthma Mother   . Congestive Heart Failure Father     Social History Social History   Tobacco Use  . Smoking status: Current Some Day Smoker    Packs/day: 0.50    Types: Cigarettes  . Smokeless tobacco: Never Used  Substance Use Topics  . Alcohol use: Yes    Alcohol/week: 1.8 oz    Types: 3 Cans of beer per week    Comment: 16 oz per week  . Drug use: Not Currently    Comment: Previous use of cocaine and marijuana last use 07/31/16     Review of Systems Constitutional: Negative for fever. Eyes: Negative for visual complaints ENT: Negative for recent illness/congestion Cardiovascular: Moderate central chest pain/pressure. Respiratory: Mild shortness of breath with exertion Gastrointestinal: Negative for abdominal pain or negative for vomiting.   Positive for nausea. Genitourinary: Negative for urinary compaints Musculoskeletal: Negative for leg pain or swelling Skin: Negative for skin complaints  Neurological: Negative for headache All other ROS negative  ____________________________________________   PHYSICAL EXAM:  VITAL SIGNS: ED Triage Vitals [04/22/18 1122]  Enc Vitals Group     BP      Pulse      Resp      Temp      Temp src      SpO2      Weight      Height      Head Circumference      Peak Flow      Pain Score 10     Pain Loc      Pain Edu?  Excl. in [place]?    Constitutional: Alert and oriented. Well appearing, mildly anxious appearing. Eyes: Normal exam ENT   Head: Normocephalic and atraumatic.   Mouth/Throat: Mucous membranes are moist. Cardiovascular: Normal rate, regular rhythm. No murmur Respiratory: Normal respiratory effort without tachypnea nor retractions. Breath sounds are clear  Gastrointestinal: Soft and nontender. No distention.   Musculoskeletal: Nontender with normal range of motion in all extremities. No lower extremity tenderness or edema. Neurologic:  Normal speech and language. No gross focal neurologic deficits Skin:  Skin is warm, dry and intact.  Psychiatric: Mood and affect are normal.   ____________________________________________    EKG  EKG reviewed and interpreted by myself shows atrial fibrillation with rapid ventricular response at 165 bpm with a narrow QRS, normal axis, largely normal intervals with nonspecific ST changes.  ____________________________________________  INITIAL IMPRESSION / ASSESSMENT AND PLAN / ED COURSE  Pertinent labs & imaging results that were available during my care of the patient were reviewed by me and considered in my medical decision making (see chart for details).  Patient presents to the emergency department with a rapid heart rate chest pain and nausea.  Patient was diagnosed with atrial fibrillation 1 month ago but states she  was shocked out of it.  For the past 1 week she has been having palpitations, states she was trying to wait for her follow-up appointment but today became very dizzy with chest pain lightheadedness and shortness of breath with exertion.  Upon arrival patient appears to be in atrial fibrillation with rapid ventricular response around 160 bpm.  States moderate central chest pressure.  Differential would include ACS, atrial fibrillation with RVR, electrolyte abnormality, infectious etiology, metabolic abnormality.  We will dose IV fluids, pain and nausea medication.  We will check labs including cardiac enzymes.  We will dose 10 mg of IV diltiazem and reassess.  Patient continues to have a heart rate around 130 bpm consistent with atrial fibrillation we will dose additional 10 mg of diltiazem.  Continues to have heart rate around 130 bpm.  Will start on a diltiazem drip and admit to the hospitalist service.  Labs including cardiac enzyme are largely at baseline/normal.   CRITICAL CARE Performed by: Harvest Dark   Total critical care time: 30 minutes  Critical care time was exclusive of separately billable procedures and treating other patients.  Critical care was necessary to treat or prevent imminent or life-threatening deterioration.  Critical care was time spent personally by me on the following activities: development of treatment plan with patient and/or surrogate as well as nursing, discussions with consultants, evaluation of patient's response to treatment, examination of patient, obtaining history from patient or surrogate, ordering and performing treatments and interventions, ordering and review of laboratory studies, ordering and review of radiographic studies, pulse oximetry and re-evaluation of patient's condition.   ____________________________________________   FINAL CLINICAL IMPRESSION(S) / ED DIAGNOSES  Chest pain Atrial fibrillation with rapid ventricular response     Harvest Dark, MD 04/22/18 1315

## 2018-04-22 NOTE — H&P (Signed)
[facility] [place] NAME: Chelsea Ramsey    MR#:  [id number]  DATE OF BIRTH:  26-Aug-1966  DATE OF ADMISSION:  04/22/2018  PRIMARY CARE PHYSICIAN: Dagmar Hait, MD   REQUESTING/REFERRING PHYSICIAN: Dr. Harvest Dark  CHIEF COMPLAINT: Chest pain, palpitations   Chief Complaint  Patient presents with  . Chest Pain    HISTORY OF PRESENT ILLNESS:  Chelsea Ramsey  is a 52 y.o. female with a known history of chronic atrial fibrillation, cardioversion in March at [facility] came in because of palpitations, chest pain, noted to have a rapid heart rate up to 190 beats at home.  Patient feeling short of breath, since last 2 days she is feeling like palpitations, shortness of breath, found to have A. fib with RVR with heart rate up to 160 bpm in the emergency room.  Also feeling very lightheaded, dizzy.  Patient supposed to take Eliquis but she says she is not taking for 1 week.  History of chronic diastolic heart failure EF of 60 to 65%, she also has rheumatic mitral regurgitation, mild aortic stenosis.  Patient still received a Cardizem 20 mg IV push, started on Cardizem drip, she is going to admitted to telemetry.  PAST MEDICAL HISTORY:   Past Medical History:  Diagnosis Date  . Allergy    seasonal  . Anxiety   . Arthritis    Right Knee  . Asthma   . CHF (congestive heart failure) ([place])   . COPD (chronic obstructive pulmonary disease) ([place])   . Coronary artery disease    Leaky heart valve  . Fibromyalgia   . GERD (gastroesophageal reflux disease)   . Hypertension   . Pneumonia   . PUD (peptic ulcer disease)   . Pulmonary HTN ([place])   . Rheumatic fever/heart disease   . Sleep apnea     PAST SURGICAL HISTOIRY:   Past Surgical History:  Procedure Laterality Date  . ADENOIDECTOMY    . MULTIPLE TOOTH EXTRACTIONS    . TONSILLECTOMY    . TOTAL KNEE ARTHROPLASTY Right 09/04/2016   Procedure: TOTAL KNEE ARTHROPLASTY; with lateral  release;  Surgeon: Earlie Server, MD;  Location: [place];  Service: Orthopedics;  Laterality: Right;    SOCIAL HISTORY:   Social History   Tobacco Use  . Smoking status: Current Some Day Smoker    Packs/day: 0.50    Types: Cigarettes  . Smokeless tobacco: Never Used  Substance Use Topics  . Alcohol use: Yes    Alcohol/week: 1.8 oz    Types: 3 Cans of beer per week    Comment: 16 oz per week    FAMILY HISTORY:   Family History  Problem Relation Age of Onset  . COPD Mother   . Cancer Mother        Bone  . Asthma Mother   . Congestive Heart Failure Father     DRUG ALLERGIES:   Allergies  Allergen Reactions  . Aspirin Swelling  . Flexeril [Cyclobenzaprine] Swelling  . Morphine And Related Swelling  . Trazamine [Trazodone & Diet Manage Prod] Nausea And Vomiting  . Codeine Rash  . Tramadol Rash    REVIEW OF SYSTEMS:  CONSTITUTIONAL: No fever, fatigue or weakness.  EYES: No blurred or double vision.  EARS, NOSE, AND THROAT: No tinnitus or ear pain.  RESPIRATORY: No cough, shortness of breath, wheezing or hemoptysis.  CARDIOVASCULAR; palpitations, exertional dyspnea.  No orthopnea or PND. GASTROINTESTINAL: No nausea, vomiting, diarrhea or  abdominal pain.  GENITOURINARY: No dysuria, hematuria.  ENDOCRINE: No polyuria, nocturia,  HEMATOLOGY: No anemia, easy bruising or bleeding SKIN: No rash or lesion. MUSCULOSKELETAL: No joint pain or arthritis.   NEUROLOGIC: No tingling, numbness, weakness.  PSYCHIATRY; is very anxious  MEDICATIONS AT HOME:   Prior to Admission medications   Medication Sig Start Date End Date Taking? Authorizing Provider  allopurinol (ZYLOPRIM) 300 MG tablet Take 1 tablet (300 mg total) by mouth daily. 02/24/18  Yes Fisher, Linden Dolin, PA-C  carvedilol (COREG) 3.125 MG tablet Take 3.125 mg by mouth 2 (two) times daily with a meal.  02/12/15  Yes [provider]  colchicine 0.6 MG tablet Take 1 tablet (0.6 mg total) by mouth daily. 02/24/18  02/24/19 Yes Fisher, Linden Dolin, PA-C  furosemide (LASIX) 80 MG tablet Take 80 mg by mouth. 07/31/15  Yes [provider]  gabapentin (NEURONTIN) 300 MG capsule Take 600 mg by mouth 3 (three) times daily.  07/22/15  Yes [provider]  losartan (COZAAR) 25 MG tablet Take 25 mg by mouth daily.   Yes [provider]  omeprazole (PRILOSEC) 20 MG capsule Take 40 mg by mouth 2 (two) times daily. 02/18/16  Yes [provider]  albuterol (PROVENTIL HFA;VENTOLIN HFA) 108 (90 Base) MCG/ACT inhaler Inhale 2 puffs into the lungs every 6 (six) hours as needed for shortness of breath. 02/26/16   [provider]  ARTIFICIAL TEAR OP Apply 2 drops to eye daily as needed (dry eyes).    [provider]  budesonide-formoterol (SYMBICORT) 160-4.5 MCG/ACT inhaler Inhale 2 puffs into the lungs 2 (two) times daily. 02/26/16   [provider]  diphenhydramine-acetaminophen (TYLENOL PM) 25-500 MG TABS tablet Take 2 tablets by mouth at bedtime as needed (sleep).     [provider]  ipratropium-albuterol (DUONEB) 0.5-2.5 (3) MG/3ML SOLN Inhale 3 mL by nebulization twice daily. 02/26/16 12/20/18  [provider]  methylPREDNISolone (MEDROL DOSEPAK) 4 MG TBPK tablet Take 6 pills on day one then decrease by 1 pill each day Patient not taking: Reported on 04/22/2018 02/24/18   Versie Starks, PA-C  oxyCODONE-acetaminophen (PERCOCET/ROXICET) 5-325 MG tablet Take 1 tablet by mouth every 4 (four) hours as needed for severe pain. Patient not taking: Reported on 04/22/2018 02/24/18   Versie Starks, PA-C  potassium chloride (K-DUR) 10 MEQ tablet Take 1 tablet (10 mEq total) by mouth daily. Patient not taking: Reported on 04/22/2018 02/24/18   Versie Starks, PA-C  vortioxetine HBr (TRINTELLIX) 10 MG TABS Take 10 mg by mouth daily as needed (mood swings [place]).    [provider]  zolpidem (AMBIEN) 10 MG tablet Take 10 mg by mouth at bedtime.    [provider]      VITAL SIGNS:  Blood pressure (!) 114/93, pulse (!) 43, temperature 98.8 F (37.1 C), temperature source Oral, resp. rate 20, weight 122 kg (269 lb), last menstrual period 11/08/2009, SpO2 100 %.  PHYSICAL EXAMINATION:  GENERAL:  52 y.o.-year-old patient lying in the bed with no acute distress.  Able to talk full sentences. EYES: Pupils equal, round, reactive to light and accommodation. No scleral icterus. Extraocular muscles intact.  HEENT: Head atraumatic, normocephalic. Oropharynx and nasopharynx clear.  NECK:  Supple, no jugular venous distention. No thyroid enlargement, no tenderness.  LUNGS: Normal breath sounds bilaterally, no wheezing, rales,rhonchi or crepitation. No use of accessory muscles of respiration.  CARDIOVASCULAR: S1,S2 irregularly regular, ABDOMEN: Soft, nontender, nondistended. Bowel sounds present. No organomegaly  or mass.  EXTREMITIES: No pedal edema, cyanosis, or clubbing.  NEUROLOGIC: Cranial nerves II through XII are intact. Muscle strength 5/5 in all extremities. Sensation intact. Gait not checked.  PSYCHIATRIC: The patient is alert and oriented x 3.  SKIN: No obvious rash, lesion, or ulcer.   LABORATORY PANEL:   CBC Recent Labs  Lab 04/22/18 1137  WBC 8.7  HGB 12.5  HCT 37.9  PLT 238   ------------------------------------------------------------------------------------------------------------------  Chemistries  Recent Labs  Lab 04/22/18 1137  NA 136  K 3.8  CL 99*  CO2 27  GLUCOSE 122*  BUN 13  CREATININE 0.75  CALCIUM 8.3*  AST 39  ALT 25  ALKPHOS 100  BILITOT 0.6   ------------------------------------------------------------------------------------------------------------------  Cardiac Enzymes Recent Labs  Lab 04/22/18 1443  TROPONINI <0.03   ------------------------------------------------------------------------------------------------------------------  RADIOLOGY:  No results found.  EKG:   Orders  placed or performed during the hospital encounter of 04/22/18  . EKG 12-Lead  . EKG 12-Lead  EKG shows A. fib with RVR with 165 bpm with narrow QRS, normal axis nonspecific ST-T changes.  IMPRESSION AND PLAN:   Atrial fibrillation with RVR: Patient had history of c cardioversion in March for A. fib comes back again with A. fib with RVR, admit the patient to telemetry, started on Cardizem drip, spoke with Dr. Saralyn Pilar, wanted to continue Cardizem drip, I started on heparin drip also.  Patient not taking Eliquis for the past 1 week.  She did not like the  Way she feels when she takes request. 2.  History of COPD, no wheezing.  Continue Symbicort, albuterol inhaler. 3.  History of chronic diastolic heart failure, stable at this time continue Lasix, losartan, Coreg.    All the records are reviewed and case discussed with ED provider. Management plans discussed with the patient, family and they are in agreement.  CODE STATUS: Full code  TOTAL TIME TAKING CARE OF THIS PATIENT: 55 minutes. (CCT)   Epifanio Lesches M.D on 04/22/2018 at 3:34 PM  Between 7am to 6pm - Pager - [phone]  After 6pm go to [email or web address] - password [email or web address]  Office  [phone]  CC: Primary care physician; Dagmar Hait, MD  Note: This dictation was prepared with Dragon dictation along with smaller phrase technology. Any transcriptional errors that result from this process are unintentional.

## 2018-04-22 NOTE — ED Triage Notes (Signed)
Pt to ED via POV c/o chest pain and palpitations. Pt states that she has been having pain for the past month but it got worse last night. Pt states that she has hx/o A. Fib and CHF. Pt states that she is dizzy and nauseated. Pt appears very anxious in triage.

## 2018-04-23 ENCOUNTER — Inpatient Hospital Stay: Payer: Medicaid Other

## 2018-04-23 LAB — BASIC METABOLIC PANEL
ANION GAP: 10 (ref 5–15)
BUN: 16 mg/dL (ref 6–20)
CALCIUM: 8.4 mg/dL — AB (ref 8.9–10.3)
CO2: 27 mmol/L (ref 22–32)
Chloride: 101 mmol/L (ref 101–111)
Creatinine, Ser: 0.6 mg/dL (ref 0.44–1.00)
GFR calc Af Amer: >60 mL/min (ref >60)
GLUCOSE: 121 mg/dL — AB (ref 65–99)
POTASSIUM: 3.5 mmol/L (ref 3.5–5.1)
SODIUM: 138 mmol/L (ref 135–145)

## 2018-04-23 LAB — MAGNESIUM: MAGNESIUM: 1.4 mg/dL — AB (ref 1.7–2.4)

## 2018-04-23 LAB — TROPONIN I

## 2018-04-23 LAB — CBC
HEMATOCRIT: 33.4 % — AB (ref 35.0–47.0)
Hemoglobin: 10.7 g/dL — ABNORMAL LOW (ref 12.0–16.0)
MCH: 26.3 pg (ref 26.0–34.0)
MCHC: 32.2 g/dL (ref 32.0–36.0)
MCV: 81.7 fL (ref 80.0–100.0)
Platelets: 186 10*3/uL (ref 150–440)
RBC: 4.09 MIL/uL (ref 3.80–5.20)
RDW: 19.8 % — AB (ref 11.5–14.5)
WBC: 6.8 10*3/uL (ref 3.6–11.0)

## 2018-04-23 LAB — GLUCOSE, CAPILLARY: Glucose-Capillary: 110 mg/dL — ABNORMAL HIGH (ref 65–99)

## 2018-04-23 LAB — TSH: TSH: 3.234 u[IU]/mL (ref 0.350–4.500)

## 2018-04-23 MED ORDER — IPRATROPIUM-ALBUTEROL 0.5-2.5 (3) MG/3ML IN SOLN
3.0000 mL | Freq: Four times a day (QID) | RESPIRATORY_TRACT | Status: DC
  Administered 2018-04-23 – 2018-04-25 (×7): 3 mL via RESPIRATORY_TRACT
  Filled 2018-04-23 (×8): qty 3

## 2018-04-23 MED ORDER — DILTIAZEM HCL 100 MG IV SOLR: 5.0000 mg/h | INTRAVENOUS | Status: DC

## 2018-04-23 MED ORDER — DILTIAZEM HCL 100 MG IV SOLR
5.0000 mg/h | INTRAVENOUS | Status: DC
  Administered 2018-04-23: 7 mg/h via INTRAVENOUS
  Filled 2018-04-23: qty 100

## 2018-04-23 MED ORDER — LACTATED RINGERS IV BOLUS
1000.0000 mL | Freq: Once | INTRAVENOUS | Status: AC
  Administered 2018-04-23: 1000 mL via INTRAVENOUS

## 2018-04-23 MED ORDER — DILTIAZEM HCL 30 MG PO TABS
60.0000 mg | ORAL_TABLET | Freq: Four times a day (QID) | ORAL | Status: DC
  Administered 2018-04-23 – 2018-04-24 (×4): 60 mg via ORAL
  Filled 2018-04-23 (×4): qty 2

## 2018-04-23 MED ORDER — POTASSIUM CHLORIDE CRYS ER 20 MEQ PO TBCR
40.0000 meq | EXTENDED_RELEASE_TABLET | Freq: Once | ORAL | Status: AC
  Administered 2018-04-23: 40 meq via ORAL
  Filled 2018-04-23: qty 4

## 2018-04-23 MED ORDER — CARVEDILOL 6.25 MG PO TABS
6.2500 mg | ORAL_TABLET | Freq: Two times a day (BID) | ORAL | Status: DC
  Administered 2018-04-23 – 2018-04-25 (×4): 6.25 mg via ORAL
  Filled 2018-04-23 (×3): qty 1

## 2018-04-23 MED ORDER — POTASSIUM CHLORIDE 10 MEQ/100ML IV SOLN
10.0000 meq | INTRAVENOUS | Status: AC
  Administered 2018-04-23: 10 meq via INTRAVENOUS
  Filled 2018-04-23 (×4): qty 100

## 2018-04-23 NOTE — Progress Notes (Signed)
[facility]  SUBJECTIVE: Patient laying in bed, reports feeling better   Vitals:   04/23/18 0530 04/23/18 0600 04/23/18 0634 04/23/18 0758  BP: 96/67 (!) 81/66 108/76 103/89  Pulse: 69 72 65 (!) 116  Resp:    18  Temp:    97.9 F (36.6 C)  TempSrc:    Oral  SpO2:    100%  Weight:   119.2 kg (262 lb 11.2 oz)   Height:         Intake/Output Summary (Last 24 hours) at 04/23/2018 0910 Last data filed at 04/22/2018 1900 Gross per 24 hour  Intake 1740 ml  Output 300 ml  Net 1440 ml      PHYSICAL EXAM  General: Well developed, well nourished, in no acute distress HEENT:  Normocephalic and atramatic Neck:  No JVD.  Lungs: Clear bilaterally to auscultation and percussion. Heart: Irregular irregular rhythm.  Normal S1 and S2 without gallops or murmurs.  Abdomen: Bowel sounds are positive, abdomen soft and non-tender  Msk:  Back normal, normal gait. Normal strength and tone for age. Extremities: No clubbing, cyanosis or edema.   Neuro: Alert and oriented X 3. Psych:  Good affect, responds appropriately   LABS: Basic Metabolic Panel: Recent Labs    04/22/18 1137 04/23/18 0234  NA 136 138  K 3.8 3.5  CL 99* 101  CO2 27 27  GLUCOSE 122* 121*  BUN 13 16  CREATININE 0.75 0.60  CALCIUM 8.3* 8.4*  MG  --  1.4*   Liver Function Tests: Recent Labs    04/22/18 1137  AST 39  ALT 25  ALKPHOS 100  BILITOT 0.6  PROT 7.8  ALBUMIN 3.7   No results for input(s): LIPASE, AMYLASE in the last 72 hours. CBC: Recent Labs    04/22/18 1137 04/23/18 0234  WBC 8.7 6.8  HGB 12.5 10.7*  HCT 37.9 33.4*  MCV 79.7* 81.7  PLT 238 186   Cardiac Enzymes: Recent Labs    04/22/18 1443 04/22/18 2125 04/23/18 0234  TROPONINI <0.03 <0.03 <0.03   BNP: Invalid input(s): POCBNP D-Dimer: No results for input(s): DDIMER in the last 72 hours. Hemoglobin A1C: No results for input(s): HGBA1C in the last 72 hours. Fasting Lipid Panel: No results for input(s): CHOL, HDL, LDLCALC,  TRIG, CHOLHDL, LDLDIRECT in the last 72 hours. Thyroid Function Tests: No results for input(s): TSH, T4TOTAL, T3FREE, THYROIDAB in the last 72 hours.  Invalid input(s): FREET3 Anemia Panel: No results for input(s): VITAMINB12, FOLATE, FERRITIN, TIBC, IRON, RETICCTPCT in the last 72 hours.  No results found.   Echo   TELEMETRY: Atrial fibrillation at 110 bpm:  ASSESSMENT AND PLAN:  Active Problems:   A-fib ([place])    1.  Recurrent atrial fibrillation with rapid ventricular rate, secondary to medical noncompliance, improved on current medications 2.  Medical noncompliance, patient reports intolerance to Eliquis 3.  COPD / mild to moderate pulmonary hypertension  Recommendations  1.  Agree with overall current therapy 2.  Transition to p.o. Cardizem 3.  Continue Xarelto for stroke prevention 4.  Defer further cardiac diagnostics   Isaias Cowman, MD, PhD, [facility] 04/23/2018 9:10 AM

## 2018-04-23 NOTE — Progress Notes (Signed)
Patient's BP running 96/75 and HR fluctuating at 75 to 110, Dr Jodell Cipro notified if the drip needs to be turned off. New order received to give  One bag of LR at 983.6/hr  and to continue to run the drip.  Patient is asymptomatic. Will continue to monitor.

## 2018-04-23 NOTE — Progress Notes (Signed)
Patient is complaining of potassium infusing burning and demanded to turn the infusion off. Potassium infusion connected to the second line and she's still complaining. Will restart a 3rd IV and try it later. Will continue to monitor.

## 2018-04-23 NOTE — Progress Notes (Signed)
[place] at [place] NAME: Chelsea Ramsey    MR#:  [id number]  DATE OF BIRTH:  09/26/1966  SUBJECTIVE:  CHIEF COMPLAINT:   Chief Complaint  Patient presents with  . Chest Pain   Came with worsening palpitation and chest tightness.  REVIEW OF SYSTEMS:  CONSTITUTIONAL: No fever, fatigue or weakness.  EYES: No blurred or double vision.  EARS, NOSE, AND THROAT: No tinnitus or ear pain.  RESPIRATORY: No cough, shortness of breath, wheezing or hemoptysis.  CARDIOVASCULAR: Have chest pain,no orthopnea, edema.  GASTROINTESTINAL: No nausea, vomiting, diarrhea or abdominal pain.  GENITOURINARY: No dysuria, hematuria.  ENDOCRINE: No polyuria, nocturia,  HEMATOLOGY: No anemia, easy bruising or bleeding SKIN: No rash or lesion. MUSCULOSKELETAL: No joint pain or arthritis.   NEUROLOGIC: No tingling, numbness, weakness.  PSYCHIATRY: No anxiety or depression.   ROS  DRUG ALLERGIES:   Allergies  Allergen Reactions  . Aspirin Swelling  . Flexeril [Cyclobenzaprine] Swelling  . Morphine And Related Swelling  . Trazamine [Trazodone & Diet Manage Prod] Nausea And Vomiting  . Codeine Rash  . Tramadol Rash    VITALS:  Blood pressure (!) 147/95, pulse (!) 120, temperature 98.4 F (36.9 C), temperature source Oral, resp. rate 16, height 5\' 5"  (1.651 m), weight 119.2 kg (262 lb 11.2 oz), last menstrual period 11/08/2009, SpO2 99 %.  PHYSICAL EXAMINATION:  GENERAL:  52 y.o.-year-old patient lying in the bed with no acute distress.  EYES: Pupils equal, round, reactive to light and accommodation. No scleral icterus. Extraocular muscles intact.  HEENT: Head atraumatic, normocephalic. Oropharynx and nasopharynx clear.  NECK:  Supple, no jugular venous distention. No thyroid enlargement, no tenderness.  LUNGS: Normal breath sounds bilaterally, no wheezing, some crepitation. No use of accessory muscles of respiration.  CARDIOVASCULAR: S1, S2 fast and  irregular. No murmurs, rubs, or gallops.  ABDOMEN: Soft, nontender, nondistended. Bowel sounds present. No organomegaly or mass.  EXTREMITIES: No pedal edema, cyanosis, or clubbing.  NEUROLOGIC: Cranial nerves II through XII are intact. Muscle strength 5/5 in all extremities. Sensation intact. Gait not checked.  PSYCHIATRIC: The patient is alert and oriented x 3.  SKIN: No obvious rash, lesion, or ulcer.   Physical Exam LABORATORY PANEL:   CBC Recent Labs  Lab 04/23/18 0234  WBC 6.8  HGB 10.7*  HCT 33.4*  PLT 186   ------------------------------------------------------------------------------------------------------------------  Chemistries  Recent Labs  Lab 04/22/18 1137 04/23/18 0234  NA 136 138  K 3.8 3.5  CL 99* 101  CO2 27 27  GLUCOSE 122* 121*  BUN 13 16  CREATININE 0.75 0.60  CALCIUM 8.3* 8.4*  MG  --  1.4*  AST 39  --   ALT 25  --   ALKPHOS 100  --   BILITOT 0.6  --    ------------------------------------------------------------------------------------------------------------------  Cardiac Enzymes Recent Labs  Lab 04/22/18 2125 04/23/18 0234  TROPONINI <0.03 <0.03   ------------------------------------------------------------------------------------------------------------------  RADIOLOGY:  Dg Chest 2 View  Result Date: 04/23/2018 CLINICAL DATA:  Hypoxia EXAM: CHEST - 2 VIEW COMPARISON:  12/20/2017 FINDINGS: The heart is moderately enlarged. Mild vascular congestion. No Kerley B lines to suggest pulmonary edema. No pneumothorax. No pleural effusion. IMPRESSION: Cardiomegaly and mild vascular congestion. Electronically Signed   By: Marybelle Killings M.D.   On: 04/23/2018 11:26    ASSESSMENT AND PLAN:   Active Problems:   A-fib ([facility])  * Atrial fibrillation with RVR: Patient had history of c cardioversion in March for A. fib comes  back again with A. fib with RVR,   moinitor telemetry,   on Cardizem drip,    On heparin drip ,  not taking Eliquis for  the past 1 week.     So started on xarelto.  Cardio suggest to start on oral cardizem now.   2.  History of COPD, no wheezing.  Continue Symbicort, albuterol inhaler. 3.  History of chronic diastolic heart failure, stable at this time continue Lasix, losartan, Coreg. 4. htn    Hold losartan as need rate controlling meds first.     All the records are reviewed and case discussed with Care Management/Social Workerr. Management plans discussed with the patient, family and they are in agreement.  CODE STATUS: Full.  TOTAL TIME TAKING CARE OF THIS PATIENT: 35 minutes.     POSSIBLE D/C IN 1-2 DAYS, DEPENDING ON CLINICAL CONDITION.   Vaughan Basta M.D on 04/23/2018   Between 7am to 6pm - Pager - [phone]  After 6pm go to [email or web address] - password [email or web address] [facility]  Office  [phone]  CC: Primary care physician; Dagmar Hait, MD  Note: This dictation was prepared with Dragon dictation along with smaller phrase technology. Any transcriptional errors that result from this process are unintentional.

## 2018-04-24 ENCOUNTER — Inpatient Hospital Stay: Payer: Medicaid Other

## 2018-04-24 LAB — GLUCOSE, CAPILLARY: GLUCOSE-CAPILLARY: 112 mg/dL — AB (ref 65–99)

## 2018-04-24 MED ORDER — GUAIFENESIN-DM 100-10 MG/5ML PO SYRP
5.0000 mL | ORAL_SOLUTION | ORAL | Status: DC | PRN
  Administered 2018-04-24 – 2018-04-25 (×2): 5 mL via ORAL
  Filled 2018-04-24 (×2): qty 5

## 2018-04-24 MED ORDER — BUTALBITAL-APAP-CAFFEINE 50-325-40 MG PO TABS
1.0000 | ORAL_TABLET | Freq: Four times a day (QID) | ORAL | Status: DC | PRN
  Administered 2018-04-24 – 2018-04-25 (×3): 1 via ORAL
  Filled 2018-04-24 (×6): qty 1

## 2018-04-24 MED ORDER — SUMATRIPTAN SUCCINATE 50 MG PO TABS
50.0000 mg | ORAL_TABLET | Freq: Once | ORAL | Status: AC
  Administered 2018-04-24: 50 mg via ORAL
  Filled 2018-04-24: qty 1

## 2018-04-24 MED ORDER — DILTIAZEM HCL ER COATED BEADS 120 MG PO CP24
240.0000 mg | ORAL_CAPSULE | Freq: Every day | ORAL | Status: DC
  Administered 2018-04-24 – 2018-04-25 (×2): 240 mg via ORAL
  Filled 2018-04-24 (×2): qty 2

## 2018-04-24 MED ORDER — DIPHENHYDRAMINE HCL 25 MG PO CAPS
25.0000 mg | ORAL_CAPSULE | Freq: Once | ORAL | Status: AC
  Administered 2018-04-24: 25 mg via ORAL
  Filled 2018-04-24: qty 1

## 2018-04-24 NOTE — Progress Notes (Signed)
[place] at [place] NAME: Chelsea Ramsey    MR#:  [id number]  DATE OF BIRTH:  15-Feb-1966  SUBJECTIVE:  CHIEF COMPLAINT:   Chief Complaint  Patient presents with  . Chest Pain   Came with worsening palpitation and chest tightness.   Improved now, but have main complain of severe headache and dizziness.  REVIEW OF SYSTEMS:  CONSTITUTIONAL: No fever, fatigue or weakness.  EYES: No blurred or double vision.  EARS, NOSE, AND THROAT: No tinnitus or ear pain.  RESPIRATORY: No cough, shortness of breath, wheezing or hemoptysis.  CARDIOVASCULAR: Have chest pain,no orthopnea, edema.  GASTROINTESTINAL: No nausea, vomiting, diarrhea or abdominal pain.  GENITOURINARY: No dysuria, hematuria.  ENDOCRINE: No polyuria, nocturia,  HEMATOLOGY: No anemia, easy bruising or bleeding SKIN: No rash or lesion. MUSCULOSKELETAL: No joint pain or arthritis.   NEUROLOGIC: No tingling, numbness, weakness.  PSYCHIATRY: No anxiety or depression.   ROS  DRUG ALLERGIES:   Allergies  Allergen Reactions  . Aspirin Swelling  . Flexeril [Cyclobenzaprine] Swelling  . Morphine And Related Swelling  . Trazamine [Trazodone & Diet Manage Prod] Nausea And Vomiting  . Codeine Rash  . Tramadol Rash    VITALS:  Blood pressure 119/78, pulse 98, temperature 98.1 F (36.7 C), temperature source Oral, resp. rate 18, height 5\' 5"  (1.651 m), weight 119.4 kg (263 lb 4.8 oz), last menstrual period 11/08/2009, SpO2 100 %.  PHYSICAL EXAMINATION:  GENERAL:  52 y.o.-year-old patient lying in the bed with no acute distress.  EYES: Pupils equal, round, reactive to light and accommodation. No scleral icterus. Extraocular muscles intact.  HEENT: Head atraumatic, normocephalic. Oropharynx and nasopharynx clear.  NECK:  Supple, no jugular venous distention. No thyroid enlargement, no tenderness.  LUNGS: Normal breath sounds bilaterally, no wheezing, some crepitation. No use of  accessory muscles of respiration.  CARDIOVASCULAR: S1, S2 regular. No murmurs, rubs, or gallops.  ABDOMEN: Soft, nontender, nondistended. Bowel sounds present. No organomegaly or mass.  EXTREMITIES: No pedal edema, cyanosis, or clubbing.  NEUROLOGIC: Cranial nerves II through XII are intact. Muscle strength 5/5 in all extremities. Sensation intact. Gait not checked.  PSYCHIATRIC: The patient is alert and oriented x 3.  SKIN: No obvious rash, lesion, or ulcer.   Physical Exam LABORATORY PANEL:   CBC Recent Labs  Lab 04/23/18 0234  WBC 6.8  HGB 10.7*  HCT 33.4*  PLT 186   ------------------------------------------------------------------------------------------------------------------  Chemistries  Recent Labs  Lab 04/22/18 1137 04/23/18 0234  NA 136 138  K 3.8 3.5  CL 99* 101  CO2 27 27  GLUCOSE 122* 121*  BUN 13 16  CREATININE 0.75 0.60  CALCIUM 8.3* 8.4*  MG  --  1.4*  AST 39  --   ALT 25  --   ALKPHOS 100  --   BILITOT 0.6  --    ------------------------------------------------------------------------------------------------------------------  Cardiac Enzymes Recent Labs  Lab 04/22/18 2125 04/23/18 0234  TROPONINI <0.03 <0.03   ------------------------------------------------------------------------------------------------------------------  RADIOLOGY:  Dg Chest 2 View  Result Date: 04/23/2018 CLINICAL DATA:  Hypoxia EXAM: CHEST - 2 VIEW COMPARISON:  12/20/2017 FINDINGS: The heart is moderately enlarged. Mild vascular congestion. No Kerley B lines to suggest pulmonary edema. No pneumothorax. No pleural effusion. IMPRESSION: Cardiomegaly and mild vascular congestion. Electronically Signed   By: Marybelle Killings M.D.   On: 04/23/2018 11:26   Ct Head Wo Contrast  Result Date: 04/24/2018 CLINICAL DATA:  Left sided headache x 48hrs, following episode of A-Fib. Now  the headache is radiating to the right side. Also notes, blurred vision and dizziness. Family hx of  strokes. EXAM: CT HEAD WITHOUT CONTRAST TECHNIQUE: Contiguous axial images were obtained from the base of the skull through the vertex without intravenous contrast. COMPARISON:  Brain MRI, 12/17/2013. FINDINGS: Brain: No evidence of acute infarction, hemorrhage, hydrocephalus, extra-axial collection or mass lesion/mass effect. Vascular: No hyperdense vessel or unexpected calcification. Skull: Normal. Negative for fracture or focal lesion. Sinuses/Orbits: Visualize globes and orbits are unremarkable. The visualized sinuses and mastoid air cells are clear. Other: None. IMPRESSION: Normal unenhanced CT scan of the brain Electronically Signed   By: Lajean Manes M.D.   On: 04/24/2018 12:51    ASSESSMENT AND PLAN:   Active Problems:   A-fib ([place])  * Atrial fibrillation with RVR: Patient had history of cardioversion in March for A. fib comes back again with A. fib with RVR,   moinitor telemetry,   on Cardizem drip,    On heparin drip, not taking Eliquis for the past 1 week.     So started on xarelto.  Cardio suggest to start on oral cardizem now.     Stable on oral cardizem, NSR. 2.  History of COPD, no wheezing.  Continue Symbicort, albuterol inhaler. 3.  History of chronic diastolic heart failure, stable at this time continue Lasix, losartan, Coreg. 4. htn    Hold losartan as need rate controlling meds first. 5. Headache- migraine   CT head is negative.   Given fioricet and imitrex now, seems did not help much- will give oral benadryl after 4 hrs once.    All the records are reviewed and case discussed with Care Management/Social Workerr. Management plans discussed with the patient, family and they are in agreement.  CODE STATUS: Full.  TOTAL TIME TAKING CARE OF THIS PATIENT: 35 minutes.     POSSIBLE D/C IN 1-2 DAYS, DEPENDING ON CLINICAL CONDITION.   Vaughan Basta M.D on 04/24/2018   Between 7am to 6pm - Pager - [phone]  After 6pm go to [email or web address] - password [email or web address]  [facility]  Office  [phone]  CC: Primary care physician; Dagmar Hait, MD  Note: This dictation was prepared with Dragon dictation along with smaller phrase technology. Any transcriptional errors that result from this process are unintentional.

## 2018-04-24 NOTE — Progress Notes (Signed)
Patient converted from Afib to NSR/ST at a range fluctuating 85 to 105. Dr  Jodell Cipro notified with a new order for EKG at 0800. Patient was c/o SOB after coming form the [facility]. Albuterol PRN administered with relief. Will continue to monitor.

## 2018-04-24 NOTE — Progress Notes (Signed)
[facility] Cardiology  SUBJECTIVE: Patient complaining of headache   Vitals:   04/24/18 0540 04/24/18 0744 04/24/18 0748 04/24/18 1023  BP: 122/72  (!) 135/104 119/78  Pulse: 96  96 98  Resp:   18   Temp: 97.8 F (36.6 C)  98.1 F (36.7 C)   TempSrc: Oral  Oral   SpO2: 99% 97% 100%   Weight: 119.4 kg (263 lb 4.8 oz)     Height:         Intake/Output Summary (Last 24 hours) at 04/24/2018 1050 Last data filed at 04/24/2018 7829 Gross per 24 hour  Intake 480 ml  Output -  Net 480 ml      PHYSICAL EXAM  General: Well developed, well nourished, in no acute distress HEENT:  Normocephalic and atramatic Neck:  No JVD.  Lungs: Clear bilaterally to auscultation and percussion. Heart: HRRR . Normal S1 and S2 without gallops or murmurs.  Abdomen: Bowel sounds are positive, abdomen soft and non-tender  Msk:  Back normal, normal gait. Normal strength and tone for age. Extremities: No clubbing, cyanosis or edema.   Neuro: Alert and oriented X 3. Psych:  Good affect, responds appropriately   LABS: Basic Metabolic Panel: Recent Labs    04/22/18 1137 04/23/18 0234  NA 136 138  K 3.8 3.5  CL 99* 101  CO2 27 27  GLUCOSE 122* 121*  BUN 13 16  CREATININE 0.75 0.60  CALCIUM 8.3* 8.4*  MG  --  1.4*   Liver Function Tests: Recent Labs    04/22/18 1137  AST 39  ALT 25  ALKPHOS 100  BILITOT 0.6  PROT 7.8  ALBUMIN 3.7   No results for input(s): LIPASE, AMYLASE in the last 72 hours. CBC: Recent Labs    04/22/18 1137 04/23/18 0234  WBC 8.7 6.8  HGB 12.5 10.7*  HCT 37.9 33.4*  MCV 79.7* 81.7  PLT 238 186   Cardiac Enzymes: Recent Labs    04/22/18 1443 04/22/18 2125 04/23/18 0234  TROPONINI <0.03 <0.03 <0.03   BNP: Invalid input(s): POCBNP D-Dimer: No results for input(s): DDIMER in the last 72 hours. Hemoglobin A1C: No results for input(s): HGBA1C in the last 72 hours. Fasting Lipid Panel: No results for input(s): CHOL, HDL, LDLCALC, TRIG, CHOLHDL, LDLDIRECT  in the last 72 hours. Thyroid Function Tests: Recent Labs    04/23/18 0234  TSH 3.234   Anemia Panel: No results for input(s): VITAMINB12, FOLATE, FERRITIN, TIBC, IRON, RETICCTPCT in the last 72 hours.  Dg Chest 2 View  Result Date: 04/23/2018 CLINICAL DATA:  Hypoxia EXAM: CHEST - 2 VIEW COMPARISON:  12/20/2017 FINDINGS: The heart is moderately enlarged. Mild vascular congestion. No Kerley B lines to suggest pulmonary edema. No pneumothorax. No pleural effusion. IMPRESSION: Cardiomegaly and mild vascular congestion. Electronically Signed   By: Marybelle Killings M.D.   On: 04/23/2018 11:26     Echo  TELEMETRY: Sinus rhythm at a rate of 100 bpm:  ASSESSMENT AND PLAN:  Active Problems:   A-fib ([place])    1.  Recurrent atrial fibrillation with rapid ventricular rate, secondary to medical noncompliance, converted to sinus rhythm on p.o. Cardizem and carvedilol, on Xarelto for stroke prevention 2.  COPD /mild to moderate pulmonary hypertension 3.  Headache  Recommendations  1.  Continue current therapy 2.  Transition to long-acting Cardizem 3.  Continue Xarelto for stroke prevention 4.  Defer further cardiac diagnostics  Sign off for now, please call if any questions   Isaias Cowman, MD,  PhD, [facility] 04/24/2018 10:50 AM

## 2018-04-25 ENCOUNTER — Inpatient Hospital Stay: Payer: Medicaid Other

## 2018-04-25 LAB — GLUCOSE, CAPILLARY: GLUCOSE-CAPILLARY: 111 mg/dL — AB (ref 65–99)

## 2018-04-25 LAB — MAGNESIUM: MAGNESIUM: 1.3 mg/dL — AB (ref 1.7–2.4)

## 2018-04-25 MED ORDER — AZITHROMYCIN 250 MG PO TABS: ORAL_TABLET | ORAL | 0 refills | Status: AC

## 2018-04-25 MED ORDER — BUTALBITAL-APAP-CAFFEINE 50-325-40 MG PO TABS: 1.0000 | ORAL_TABLET | Freq: Four times a day (QID) | ORAL | 0 refills | Status: DC | PRN

## 2018-04-25 MED ORDER — HYDROCOD POLST-CPM POLST ER 10-8 MG/5ML PO SUER: 5.0000 mL | Freq: Two times a day (BID) | ORAL | 0 refills | Status: DC | PRN

## 2018-04-25 MED ORDER — RIVAROXABAN 20 MG PO TABS: 20.0000 mg | ORAL_TABLET | Freq: Every day | ORAL | 0 refills | Status: DC

## 2018-04-25 MED ORDER — SODIUM CHLORIDE 0.9 % IV SOLN
1.0000 g | INTRAVENOUS | Status: DC
  Filled 2018-04-25: qty 10

## 2018-04-25 MED ORDER — OXYCODONE-ACETAMINOPHEN 5-325 MG PO TABS: 1.0000 | ORAL_TABLET | Freq: Four times a day (QID) | ORAL | 0 refills | Status: DC | PRN

## 2018-04-25 MED ORDER — DILTIAZEM HCL ER COATED BEADS 240 MG PO CP24: 240.0000 mg | ORAL_CAPSULE | Freq: Every day | ORAL | 0 refills | Status: DC

## 2018-04-25 MED ORDER — GUAIFENESIN-DM 100-10 MG/5ML PO SYRP: 5.0000 mL | ORAL_SOLUTION | Freq: Four times a day (QID) | ORAL | 0 refills | Status: DC | PRN

## 2018-04-25 MED ORDER — HYDROCOD POLST-CPM POLST ER 10-8 MG/5ML PO SUER
5.0000 mL | Freq: Two times a day (BID) | ORAL | Status: DC | PRN
  Administered 2018-04-25: 5 mL via ORAL
  Filled 2018-04-25: qty 5

## 2018-04-25 MED ORDER — CARVEDILOL 6.25 MG PO TABS: 6.2500 mg | ORAL_TABLET | Freq: Two times a day (BID) | ORAL | 0 refills | Status: DC

## 2018-04-25 MED ORDER — DEXTROSE 5 % IV SOLN
250.0000 mg | INTRAVENOUS | Status: DC
  Filled 2018-04-25: qty 250

## 2018-04-25 MED ORDER — POTASSIUM CHLORIDE CRYS ER 20 MEQ PO TBCR
40.0000 meq | EXTENDED_RELEASE_TABLET | Freq: Once | ORAL | Status: AC
  Administered 2018-04-25: 40 meq via ORAL
  Filled 2018-04-25: qty 2

## 2018-04-25 MED ORDER — FUROSEMIDE 10 MG/ML IJ SOLN
20.0000 mg | INTRAMUSCULAR | Status: AC
  Administered 2018-04-25: 20 mg via INTRAVENOUS
  Filled 2018-04-25: qty 2

## 2018-04-25 MED ORDER — MAGNESIUM SULFATE 2 GM/50ML IV SOLN
2.0000 g | Freq: Once | INTRAVENOUS | Status: AC
  Administered 2018-04-25: 2 g via INTRAVENOUS
  Filled 2018-04-25: qty 50

## 2018-04-25 NOTE — Discharge Summary (Signed)
[place] [facility] [place] NAME: Chelsea Ramsey    MR#:  [id number]  DATE OF BIRTH:  Apr 06, 1966  DATE OF ADMISSION:  04/22/2018 ADMITTING PHYSICIAN: Epifanio Lesches, MD  DATE OF DISCHARGE: 04/25/2018  PRIMARY CARE PHYSICIAN: Dagmar Hait, MD    ADMISSION DIAGNOSIS:  Atrial fibrillation with rapid ventricular response ([place]) [I48.91]  DISCHARGE DIAGNOSIS:  Active Problems:   A-fib ([place])   SECONDARY DIAGNOSIS:   Past Medical History:  Diagnosis Date  . Allergy    seasonal  . Anxiety   . Arthritis    Right Knee  . Asthma   . CHF (congestive heart failure) ([place])   . COPD (chronic obstructive pulmonary disease) ([place])   . Coronary artery disease    Leaky heart valve  . Fibromyalgia   . GERD (gastroesophageal reflux disease)   . Hypertension   . Pneumonia   . PUD (peptic ulcer disease)   . Pulmonary HTN ([place])   . Rheumatic fever/heart disease   . Sleep apnea     HOSPITAL COURSE:   * Atrial fibrillation with RVR: Patient had history of cardioversion in March for A. fib comes back again   with A. fib with RVR,    moinitor telemetry,   on Cardizem drip,    On heparin drip, not taking Eliquis for the past 1 week.    So started on xarelto.  Cardio suggest to start on oral cardizem now.     Stable on oral cardizem and coreg, NSR.  * History of COPD, no wheezing. Continue Symbicort, albuterol inhaler. * History of chronic diastolic heart failure, stable at this time continue Lasix, losartan, Coreg. * htn    Hold losartan as need rate controlling meds first.   Will give lasix, coreg and cardizem on d.c * Headache- migraine   CT head is negative.   Given fioricet and imitrex  And oral benadryl after 4 hrs once.   Much improved. * Hypomagnesemia- replace before d/c * Cough- may have some bronchitis    Give oral azithromycine and Cough syrup.  DISCHARGE CONDITIONS:   Stable.  CONSULTS OBTAINED:    DRUG  ALLERGIES:   Allergies  Allergen Reactions  . Aspirin Swelling  . Flexeril [Cyclobenzaprine] Swelling  . Morphine And Related Swelling  . Trazamine [Trazodone & Diet Manage Prod] Nausea And Vomiting  . Codeine Rash  . Tramadol Rash    DISCHARGE MEDICATIONS:   Allergies as of 04/25/2018      Reactions   Aspirin Swelling   Flexeril [cyclobenzaprine] Swelling   Morphine And Related Swelling   Trazamine [trazodone & Diet Manage Prod] Nausea And Vomiting   Codeine Rash   Tramadol Rash      Medication List    STOP taking these medications   losartan 25 MG tablet Commonly known as:  COZAAR   methylPREDNISolone 4 MG Tbpk tablet Commonly known as:  MEDROL DOSEPAK     TAKE these medications   albuterol 108 (90 Base) MCG/ACT inhaler Commonly known as:  PROVENTIL HFA;VENTOLIN HFA Inhale 2 puffs into the lungs every 6 (six) hours as needed for shortness of breath.   allopurinol 300 MG tablet Commonly known as:  ZYLOPRIM Take 1 tablet (300 mg total) by mouth daily.   ARTIFICIAL TEAR OP Apply 2 drops to eye daily as needed (dry eyes).   azithromycin 250 MG tablet Commonly known as:  ZITHROMAX Z-PAK Take 2 tablets (500 mg) on  Day 1,  followed  by 1 tablet (250 mg) once daily on Days 2 through 5.   butalbital-acetaminophen-caffeine 50-325-40 MG tablet Commonly known as:  FIORICET, ESGIC Take 1 tablet by mouth every 6 (six) hours as needed for headache.   carvedilol 6.25 MG tablet Commonly known as:  COREG Take 1 tablet (6.25 mg total) by mouth 2 (two) times daily with a meal. What changed:    medication strength  how much to take   chlorpheniramine-HYDROcodone 10-8 MG/5ML Suer Commonly known as:  TUSSIONEX Take 5 mLs by mouth every 12 (twelve) hours as needed for cough.   colchicine 0.6 MG tablet Take 1 tablet (0.6 mg total) by mouth daily.   diltiazem 240 MG 24 hr capsule Commonly known as:  CARDIZEM CD Take 1 capsule (240 mg total) by mouth daily. Start taking  on:  04/26/2018   diphenhydramine-acetaminophen 25-500 MG Tabs tablet Commonly known as:  TYLENOL PM Take 2 tablets by mouth at bedtime as needed (sleep).   furosemide 80 MG tablet Commonly known as:  LASIX Take 80 mg by mouth.   gabapentin 300 MG capsule Commonly known as:  NEURONTIN Take 600 mg by mouth 3 (three) times daily.   guaiFENesin-dextromethorphan 100-10 MG/5ML syrup Commonly known as:  ROBITUSSIN DM Take 5 mLs by mouth every 6 (six) hours as needed for cough.   ipratropium-albuterol 0.5-2.5 (3) MG/3ML Soln Commonly known as:  DUONEB Inhale 3 mL by nebulization twice daily.   omeprazole 20 MG capsule Commonly known as:  PRILOSEC Take 40 mg by mouth 2 (two) times daily.   oxyCODONE-acetaminophen 5-325 MG tablet Commonly known as:  PERCOCET/ROXICET Take 1 tablet by mouth every 6 (six) hours as needed for severe pain. What changed:  when to take this   potassium chloride 10 MEQ tablet Commonly known as:  K-DUR Take 1 tablet (10 mEq total) by mouth daily.   rivaroxaban 20 MG Tabs tablet Commonly known as:  XARELTO Take 1 tablet (20 mg total) by mouth daily with supper.   SYMBICORT 160-4.5 MCG/ACT inhaler Generic drug:  budesonide-formoterol Inhale 2 puffs into the lungs 2 (two) times daily.   TRINTELLIX 10 MG Tabs tablet Generic drug:  vortioxetine HBr Take 10 mg by mouth daily as needed (mood swings /anxiety).   zolpidem 10 MG tablet Commonly known as:  AMBIEN Take 10 mg by mouth at bedtime.        DISCHARGE INSTRUCTIONS:    Follow with cardio clinic in 1-2 weeks.  If you experience worsening of your admission symptoms, develop shortness of breath, life threatening emergency, suicidal or homicidal thoughts you must seek medical attention immediately by calling 911 or calling your MD immediately  if symptoms less severe.  You Must read complete instructions/literature along with all the possible adverse reactions/side effects for all the Medicines  you take and that have been prescribed to you. Take any new Medicines after you have completely understood and accept all the possible adverse reactions/side effects.   Please note  You were cared for by a hospitalist during your hospital stay. If you have any questions about your discharge medications or the care you received while you were in the hospital after you are discharged, you can call the unit and asked to speak with the hospitalist on call if the hospitalist that took care of you is not available. Once you are discharged, your primary care physician will handle any further medical issues. Please note that NO REFILLS for any discharge medications will be authorized once you are  discharged, as it is imperative that you return to your primary care physician (or establish a relationship with a primary care physician if you do not have one) for your aftercare needs so that they can reassess your need for medications and monitor your lab values.    Today   CHIEF COMPLAINT:   Chief Complaint  Patient presents with  . Chest Pain    HISTORY OF PRESENT ILLNESS:  Chelsea Ramsey  is a 52 y.o. female with a known history of chronic atrial fibrillation, cardioversion in March at [facility] came in because of palpitations, chest pain, noted to have a rapid heart rate up to 190 beats at home.  Patient feeling short of breath, since last 2 days she is feeling like palpitations, shortness of breath, found to have A. fib with RVR with heart rate up to 160 bpm in the emergency room.  Also feeling very lightheaded, dizzy.  Patient supposed to take Eliquis but she says she is not taking for 1 week.  History of chronic diastolic heart failure EF of 60 to 65%, she also has rheumatic mitral regurgitation, mild aortic stenosis.  Patient still received a Cardizem 20 mg IV push, started on Cardizem drip, she is going to admitted to telemetry   VITAL SIGNS:  Blood pressure 116/90, pulse 94, temperature 97.8 F (36.6  C), temperature source Oral, resp. rate 16, height 5\' 5"  (1.651 m), weight 118.6 kg (261 lb 7.5 oz), last menstrual period 11/08/2009, SpO2 98 %.  I/O:    Intake/Output Summary (Last 24 hours) at 04/25/2018 1110 Last data filed at 04/25/2018 0500 Gross per 24 hour  Intake 240 ml  Output 200 ml  Net 40 ml    PHYSICAL EXAMINATION:  GENERAL:  52 y.o.-year-old patient lying in the bed with no acute distress.  EYES: Pupils equal, round, reactive to light and accommodation. No scleral icterus. Extraocular muscles intact.  HEENT: Head atraumatic, normocephalic. Oropharynx and nasopharynx clear.  NECK:  Supple, no jugular venous distention. No thyroid enlargement, no tenderness.  LUNGS: Normal breath sounds bilaterally, no wheezing, rales,rhonchi or crepitation. No use of accessory muscles of respiration.  CARDIOVASCULAR: S1, S2 normal. No murmurs, rubs, or gallops.  ABDOMEN: Soft, non-tender, non-distended. Bowel sounds present. No organomegaly or mass.  EXTREMITIES: No pedal edema, cyanosis, or clubbing.  NEUROLOGIC: Cranial nerves II through XII are intact. Muscle strength 5/5 in all extremities. Sensation intact. Gait not checked.  PSYCHIATRIC: The patient is alert and oriented x 3.  SKIN: No obvious rash, lesion, or ulcer.   DATA REVIEW:   CBC Recent Labs  Lab 04/23/18 0234  WBC 6.8  HGB 10.7*  HCT 33.4*  PLT 186    Chemistries  Recent Labs  Lab 04/22/18 1137  04/23/18 0234 04/25/18 1000  NA 136  --  138  --   K 3.8  --  3.5  --   CL 99*  --  101  --   CO2 27  --  27  --   GLUCOSE 122*  --  121*  --   BUN 13  --  16  --   CREATININE 0.75  --  0.60  --   CALCIUM 8.3*  --  8.4*  --   MG  --    < > 1.4* 1.3*  AST 39  --   --   --   ALT 25  --   --   --   ALKPHOS 100  --   --   --  BILITOT 0.6  --   --   --    < > = values in this interval not displayed.    Cardiac Enzymes Recent Labs  Lab 04/23/18 0234  TROPONINI <0.03    Microbiology Results  Results for  orders placed or performed during the hospital encounter of 12/20/17  Urine Culture     Status: Abnormal   Collection Time: 12/20/17 11:55 AM  Result Value Ref Range Status   Specimen Description   Final    URINE, RANDOM Performed at [facility], [address], [place], [place] [address]    Special Requests   Final    NONE Performed at [facility], [address] [place], [place] [address]    Culture (A)  Final    <10,000 COLONIES/mL INSIGNIFICANT GROWTH Performed at [facility] [address], [place], [place] [address]    Report Status 12/22/2017 FINAL  Final    RADIOLOGY:  Dg Chest 2 View  Result Date: 04/25/2018 CLINICAL DATA:  Shortness of breath especially on exertion, wheezing on expiration, on O2 EXAM: CHEST - 2 VIEW COMPARISON:  04/23/2018 and older exams. FINDINGS: Mild enlargement of the cardiopericardial silhouette. No mediastinal or hilar masses. Thickened bronchovascular/interstitial markings are without change from the previous day's study. Lungs otherwise clear. No pleural effusion or pneumothorax. Skeletal structures are intact. IMPRESSION: 1. No acute findings. 2. Mild cardiomegaly and chronic prominence of the bronchovascular markings without overt pulmonary edema. Findings stable from the previous exam. Electronically Signed   By: Lajean Manes M.D.   On: 04/25/2018 09:38   Ct Head Wo Contrast  Result Date: 04/24/2018 CLINICAL DATA:  Left sided headache x 48hrs, following episode of A-Fib. Now the headache is radiating to the right side. Also notes, blurred vision and dizziness. Family hx of strokes. EXAM: CT HEAD WITHOUT CONTRAST TECHNIQUE: Contiguous axial images were obtained from the base of the skull through the vertex without intravenous contrast. COMPARISON:  Brain MRI, 12/17/2013. FINDINGS: Brain: No evidence of acute infarction, hemorrhage, hydrocephalus, extra-axial collection or mass lesion/mass effect. Vascular: No hyperdense  vessel or unexpected calcification. Skull: Normal. Negative for fracture or focal lesion. Sinuses/Orbits: Visualize globes and orbits are unremarkable. The visualized sinuses and mastoid air cells are clear. Other: None. IMPRESSION: Normal unenhanced CT scan of the brain Electronically Signed   By: Lajean Manes M.D.   On: 04/24/2018 12:51    EKG:   Orders placed or performed during the hospital encounter of 04/22/18  . EKG 12-Lead  . EKG 12-Lead  . EKG 12-Lead  . EKG 12-Lead      Management plans discussed with the patient, family and they are in agreement.  CODE STATUS:     Code Status Orders  (From admission, onward)        Start     Ordered   04/22/18 1437  Full code  Continuous     04/22/18 1440    Code Status History    Date Active Date Inactive Code Status Order ID Comments User Context   12/20/2017 1946 12/21/2017 1447 Full Code [id number]  Gorden Harms, MD ED   09/04/2016 1449 09/05/2016 1715 Full Code [id number]  Chriss Czar, PA-C Inpatient      TOTAL TIME TAKING CARE OF THIS PATIENT: 35 minutes.    Vaughan Basta M.D on 04/25/2018 at 11:10 AM  Between 7am to 6pm - Pager - [phone]  After 6pm go to [email or web address] - [place]  [facility]  Office  [phone]  CC: Primary care physician; Dagmar Hait, MD   Note: This dictation was prepared with Dragon dictation along with smaller phrase technology. Any transcriptional errors that result from this process are unintentional.

## 2018-04-25 NOTE — Progress Notes (Signed)
MD notified. Pt complains of dizziness and feeling full. Pts lungs show exp wheeze to ascultation. Prior day patients lungs showed clear/diminished. Pt also complains of nagging cough with little production. MD orders repeat CXR, tussionex, and one time dose of lasix IV. I will continue to assess.

## 2018-04-25 NOTE — Care Management (Signed)
Patient to discharge home today.  Provided 30 day trial coupon for Xarelto.  Patient confirms she has chronic O2 through [facility] home care. Patient instructed to have ride bring portable O2 for discharge.

## 2018-04-28 ENCOUNTER — Telehealth: Payer: Self-pay

## 2018-04-28 NOTE — Telephone Encounter (Signed)
Flagged on EMMI report for not having a follow up scheduled, having unfilled prescriptions and being unable to fill them today/tomorrow.  First attempt to reach patient made 04/28/18 at 2:20pm, however unable to reach patient.  Left voicemail encouraging callback. Will attempt at later time.

## 2018-04-29 NOTE — Telephone Encounter (Signed)
Reached patient upon second attempt on 04/29/18 at 2:10pm.  She reports she was able to schedule her follow up appointment (has it on 6/10) and has since been able to get her medications filled.   No further questions at this time.  I thanked her for her time and informed her she would receive one more automated call in the next few days as a final check-in.

## 2018-05-02 ENCOUNTER — Telehealth: Payer: Self-pay | Admitting: Internal Medicine

## 2018-05-02 NOTE — Telephone Encounter (Signed)
CSW attempted to contact patient for follow up call to feeling sad, hopeless, anxious, and lost interest things.  CSW left a message for patient to call back.  [name]. [place], MSW, [place]  05/02/2018 9:20 AM

## 2018-05-22 ENCOUNTER — Encounter: Payer: Self-pay | Admitting: Emergency Medicine

## 2018-05-22 ENCOUNTER — Other Ambulatory Visit: Payer: Self-pay

## 2018-05-22 ENCOUNTER — Inpatient Hospital Stay
Admission: EM | Admit: 2018-05-22 | Discharge: 2018-05-25 | DRG: 378 | Disposition: A | Discharge status: Home / Self Care | Payer: Medicaid Other | Attending: Internal Medicine | Admitting: Internal Medicine

## 2018-05-22 ENCOUNTER — Emergency Department: Payer: Medicaid Other

## 2018-05-22 DIAGNOSIS — I480 Paroxysmal atrial fibrillation: Secondary | ICD-10-CM | POA: Diagnosis present

## 2018-05-22 DIAGNOSIS — Z9981 Dependence on supplemental oxygen: Secondary | ICD-10-CM

## 2018-05-22 DIAGNOSIS — Z96651 Presence of right artificial knee joint: Secondary | ICD-10-CM | POA: Diagnosis present

## 2018-05-22 DIAGNOSIS — Z8673 Personal history of transient ischemic attack (TIA), and cerebral infarction without residual deficits: Secondary | ICD-10-CM | POA: Diagnosis not present

## 2018-05-22 DIAGNOSIS — I272 Pulmonary hypertension, unspecified: Secondary | ICD-10-CM | POA: Diagnosis present

## 2018-05-22 DIAGNOSIS — I059 Rheumatic mitral valve disease, unspecified: Secondary | ICD-10-CM | POA: Diagnosis present

## 2018-05-22 DIAGNOSIS — Z885 Allergy status to narcotic agent status: Secondary | ICD-10-CM

## 2018-05-22 DIAGNOSIS — Z886 Allergy status to analgesic agent status: Secondary | ICD-10-CM

## 2018-05-22 DIAGNOSIS — R0600 Dyspnea, unspecified: Secondary | ICD-10-CM

## 2018-05-22 DIAGNOSIS — Z9089 Acquired absence of other organs: Secondary | ICD-10-CM | POA: Diagnosis not present

## 2018-05-22 DIAGNOSIS — E876 Hypokalemia: Secondary | ICD-10-CM | POA: Diagnosis present

## 2018-05-22 DIAGNOSIS — F17210 Nicotine dependence, cigarettes, uncomplicated: Secondary | ICD-10-CM | POA: Diagnosis present

## 2018-05-22 DIAGNOSIS — R112 Nausea with vomiting, unspecified: Secondary | ICD-10-CM

## 2018-05-22 DIAGNOSIS — Z888 Allergy status to other drugs, medicaments and biological substances status: Secondary | ICD-10-CM | POA: Diagnosis not present

## 2018-05-22 DIAGNOSIS — G4733 Obstructive sleep apnea (adult) (pediatric): Secondary | ICD-10-CM | POA: Diagnosis present

## 2018-05-22 DIAGNOSIS — J449 Chronic obstructive pulmonary disease, unspecified: Secondary | ICD-10-CM | POA: Diagnosis present

## 2018-05-22 DIAGNOSIS — M797 Fibromyalgia: Secondary | ICD-10-CM | POA: Diagnosis present

## 2018-05-22 DIAGNOSIS — K219 Gastro-esophageal reflux disease without esophagitis: Secondary | ICD-10-CM | POA: Diagnosis present

## 2018-05-22 DIAGNOSIS — K253 Acute gastric ulcer without hemorrhage or perforation: Secondary | ICD-10-CM | POA: Diagnosis not present

## 2018-05-22 DIAGNOSIS — D62 Acute posthemorrhagic anemia: Secondary | ICD-10-CM | POA: Diagnosis present

## 2018-05-22 DIAGNOSIS — J9610 Chronic respiratory failure, unspecified whether with hypoxia or hypercapnia: Secondary | ICD-10-CM | POA: Diagnosis present

## 2018-05-22 DIAGNOSIS — K254 Chronic or unspecified gastric ulcer with hemorrhage: Secondary | ICD-10-CM | POA: Diagnosis present

## 2018-05-22 DIAGNOSIS — K922 Gastrointestinal hemorrhage, unspecified: Secondary | ICD-10-CM | POA: Diagnosis present

## 2018-05-22 DIAGNOSIS — K921 Melena: Secondary | ICD-10-CM

## 2018-05-22 DIAGNOSIS — Z8249 Family history of ischemic heart disease and other diseases of the circulatory system: Secondary | ICD-10-CM | POA: Diagnosis not present

## 2018-05-22 DIAGNOSIS — Z7901 Long term (current) use of anticoagulants: Secondary | ICD-10-CM

## 2018-05-22 DIAGNOSIS — I5032 Chronic diastolic (congestive) heart failure: Secondary | ICD-10-CM | POA: Diagnosis present

## 2018-05-22 DIAGNOSIS — R0789 Other chest pain: Secondary | ICD-10-CM | POA: Diagnosis present

## 2018-05-22 DIAGNOSIS — Z825 Family history of asthma and other chronic lower respiratory diseases: Secondary | ICD-10-CM | POA: Diagnosis not present

## 2018-05-22 LAB — COMPREHENSIVE METABOLIC PANEL
ALK PHOS: 96 U/L (ref 38–126)
ALT: 22 U/L (ref 14–54)
ANION GAP: 12 (ref 5–15)
AST: 21 U/L (ref 15–41)
Albumin: 3.7 g/dL (ref 3.5–5.0)
BUN: 10 mg/dL (ref 6–20)
CALCIUM: 8.6 mg/dL — AB (ref 8.9–10.3)
CO2: 29 mmol/L (ref 22–32)
Chloride: 95 mmol/L — ABNORMAL LOW (ref 101–111)
Creatinine, Ser: 0.47 mg/dL (ref 0.44–1.00)
Glucose, Bld: 103 mg/dL — ABNORMAL HIGH (ref 65–99)
Potassium: 2.9 mmol/L — ABNORMAL LOW (ref 3.5–5.1)
Sodium: 136 mmol/L (ref 135–145)
Total Bilirubin: 0.4 mg/dL (ref 0.3–1.2)
Total Protein: 7.5 g/dL (ref 6.5–8.1)

## 2018-05-22 LAB — PROTIME-INR
INR: 1.04
Prothrombin Time: 13.5 seconds (ref 11.4–15.2)

## 2018-05-22 LAB — CBC
HCT: 21.4 % — ABNORMAL LOW (ref 35.0–47.0)
Hemoglobin: 6.8 g/dL — ABNORMAL LOW (ref 12.0–16.0)
MCH: 24.6 pg — ABNORMAL LOW (ref 26.0–34.0)
MCHC: 31.6 g/dL — AB (ref 32.0–36.0)
MCV: 77.7 fL — ABNORMAL LOW (ref 80.0–100.0)
PLATELETS: 279 10*3/uL (ref 150–440)
RBC: 2.76 MIL/uL — ABNORMAL LOW (ref 3.80–5.20)
RDW: 18.5 % — AB (ref 11.5–14.5)
WBC: 13 10*3/uL — ABNORMAL HIGH (ref 3.6–11.0)

## 2018-05-22 LAB — ABO/RH: ABO/RH(D): B POS

## 2018-05-22 LAB — TROPONIN I

## 2018-05-22 LAB — PREGNANCY, URINE: PREG TEST UR: NEGATIVE

## 2018-05-22 LAB — PREPARE RBC (CROSSMATCH)

## 2018-05-22 LAB — APTT: APTT: 26 s (ref 24–36)

## 2018-05-22 LAB — MAGNESIUM: Magnesium: 1.7 mg/dL (ref 1.7–2.4)

## 2018-05-22 MED ORDER — HYDROCOD POLST-CPM POLST ER 10-8 MG/5ML PO SUER
5.0000 mL | Freq: Three times a day (TID) | ORAL | Status: DC | PRN
  Administered 2018-05-22 – 2018-05-25 (×8): 5 mL via ORAL
  Filled 2018-05-22 (×8): qty 5

## 2018-05-22 MED ORDER — ACETAMINOPHEN 325 MG PO TABS: 650.0000 mg | ORAL_TABLET | Freq: Four times a day (QID) | ORAL | Status: DC | PRN

## 2018-05-22 MED ORDER — ONDANSETRON HCL 4 MG/2ML IJ SOLN
4.0000 mg | Freq: Four times a day (QID) | INTRAMUSCULAR | Status: DC | PRN
  Administered 2018-05-22 – 2018-05-24 (×4): 4 mg via INTRAVENOUS
  Filled 2018-05-22 (×4): qty 2

## 2018-05-22 MED ORDER — FAMOTIDINE IN NACL 20-0.9 MG/50ML-% IV SOLN
20.0000 mg | Freq: Two times a day (BID) | INTRAVENOUS | Status: DC
  Administered 2018-05-22 – 2018-05-25 (×7): 20 mg via INTRAVENOUS
  Filled 2018-05-22 (×7): qty 50

## 2018-05-22 MED ORDER — ACETAMINOPHEN 650 MG RE SUPP: 650.0000 mg | Freq: Four times a day (QID) | RECTAL | Status: DC | PRN

## 2018-05-22 MED ORDER — HYDROCODONE-ACETAMINOPHEN 10-325 MG PO TABS
1.0000 | ORAL_TABLET | Freq: Four times a day (QID) | ORAL | Status: DC | PRN
  Administered 2018-05-22 – 2018-05-25 (×7): 1 via ORAL
  Filled 2018-05-22 (×7): qty 1

## 2018-05-22 MED ORDER — ORAL CARE MOUTH RINSE
15.0000 mL | Freq: Two times a day (BID) | OROMUCOSAL | Status: DC
  Administered 2018-05-22 – 2018-05-24 (×5): 15 mL via OROMUCOSAL

## 2018-05-22 MED ORDER — GUAIFENESIN-DM 100-10 MG/5ML PO SYRP
5.0000 mL | ORAL_SOLUTION | Freq: Four times a day (QID) | ORAL | Status: DC | PRN
  Administered 2018-05-22 – 2018-05-25 (×7): 5 mL via ORAL
  Filled 2018-05-22 (×7): qty 5

## 2018-05-22 MED ORDER — POTASSIUM CHLORIDE CRYS ER 20 MEQ PO TBCR
40.0000 meq | EXTENDED_RELEASE_TABLET | Freq: Two times a day (BID) | ORAL | Status: DC
  Administered 2018-05-22 – 2018-05-23 (×2): 40 meq via ORAL
  Filled 2018-05-22 (×2): qty 2

## 2018-05-22 MED ORDER — DILTIAZEM HCL ER COATED BEADS 120 MG PO CP24
240.0000 mg | ORAL_CAPSULE | Freq: Every day | ORAL | Status: DC
  Administered 2018-05-23 – 2018-05-25 (×3): 240 mg via ORAL
  Filled 2018-05-22 (×3): qty 2

## 2018-05-22 MED ORDER — POTASSIUM CHLORIDE IN NACL 40-0.9 MEQ/L-% IV SOLN
INTRAVENOUS | Status: DC
  Administered 2018-05-22: 75 mL/h via INTRAVENOUS
  Filled 2018-05-22 (×2): qty 1000

## 2018-05-22 MED ORDER — POTASSIUM CHLORIDE 10 MEQ/100ML IV SOLN
10.0000 meq | INTRAVENOUS | Status: AC
  Administered 2018-05-22 (×3): 10 meq via INTRAVENOUS
  Filled 2018-05-22 (×4): qty 100

## 2018-05-22 MED ORDER — IPRATROPIUM-ALBUTEROL 0.5-2.5 (3) MG/3ML IN SOLN
3.0000 mL | Freq: Three times a day (TID) | RESPIRATORY_TRACT | Status: DC
  Administered 2018-05-22 – 2018-05-25 (×10): 3 mL via RESPIRATORY_TRACT
  Filled 2018-05-22 (×10): qty 3

## 2018-05-22 MED ORDER — BUTALBITAL-APAP-CAFFEINE 50-325-40 MG PO TABS
1.0000 | ORAL_TABLET | Freq: Four times a day (QID) | ORAL | Status: DC | PRN
  Administered 2018-05-22 – 2018-05-23 (×2): 1 via ORAL
  Filled 2018-05-22 (×2): qty 1

## 2018-05-22 MED ORDER — FENTANYL CITRATE (PF) 100 MCG/2ML IJ SOLN: 12.5000 ug | Freq: Once | INTRAMUSCULAR | Status: DC

## 2018-05-22 MED ORDER — ZOLPIDEM TARTRATE 5 MG PO TABS: 5.0000 mg | ORAL_TABLET | Freq: Every day | ORAL | Status: DC | PRN

## 2018-05-22 MED ORDER — DIPHENHYDRAMINE HCL 25 MG PO CAPS
25.0000 mg | ORAL_CAPSULE | Freq: Once | ORAL | Status: AC
  Administered 2018-05-22: 25 mg via ORAL
  Filled 2018-05-22: qty 1

## 2018-05-22 MED ORDER — SODIUM CHLORIDE 0.9 % IV SOLN
10.0000 mL/h | Freq: Once | INTRAVENOUS | Status: AC
  Administered 2018-05-22: 10 mL/h via INTRAVENOUS

## 2018-05-22 MED ORDER — MORPHINE SULFATE (PF) 2 MG/ML IV SOLN
2.0000 mg | Freq: Once | INTRAVENOUS | Status: AC
  Administered 2018-05-22: 2 mg via INTRAVENOUS
  Filled 2018-05-22: qty 1

## 2018-05-22 MED ORDER — MOMETASONE FURO-FORMOTEROL FUM 200-5 MCG/ACT IN AERO
2.0000 | INHALATION_SPRAY | Freq: Two times a day (BID) | RESPIRATORY_TRACT | Status: DC
  Administered 2018-05-22 – 2018-05-25 (×6): 2 via RESPIRATORY_TRACT
  Filled 2018-05-22: qty 8.8

## 2018-05-22 MED ORDER — ZOLPIDEM TARTRATE 5 MG PO TABS: 5.0000 mg | ORAL_TABLET | Freq: Every evening | ORAL | Status: DC | PRN

## 2018-05-22 MED ORDER — HYDROCODONE-ACETAMINOPHEN 10-325 MG PO TABS
1.0000 | ORAL_TABLET | Freq: Every day | ORAL | Status: DC
Start: 2018-05-22 — End: 2018-05-22
  Administered 2018-05-22: 1 via ORAL
  Filled 2018-05-22: qty 1

## 2018-05-22 MED ORDER — ONDANSETRON HCL 4 MG/2ML IJ SOLN
INTRAMUSCULAR | Status: AC
  Administered 2018-05-22: 4 mg
  Filled 2018-05-22: qty 2

## 2018-05-22 MED ORDER — MORPHINE SULFATE (PF) 4 MG/ML IV SOLN
INTRAVENOUS | Status: AC
  Filled 2018-05-22: qty 1

## 2018-05-22 MED ORDER — CARVEDILOL 6.25 MG PO TABS
6.2500 mg | ORAL_TABLET | Freq: Two times a day (BID) | ORAL | Status: DC
  Administered 2018-05-22 – 2018-05-25 (×7): 6.25 mg via ORAL
  Filled 2018-05-22 (×7): qty 1

## 2018-05-22 MED ORDER — GABAPENTIN 300 MG PO CAPS
600.0000 mg | ORAL_CAPSULE | Freq: Three times a day (TID) | ORAL | Status: DC
  Administered 2018-05-22 – 2018-05-25 (×9): 600 mg via ORAL
  Filled 2018-05-22 (×9): qty 2

## 2018-05-22 MED ORDER — SENNOSIDES-DOCUSATE SODIUM 8.6-50 MG PO TABS
1.0000 | ORAL_TABLET | Freq: Every evening | ORAL | Status: DC | PRN
  Administered 2018-05-23 – 2018-05-25 (×2): 1 via ORAL
  Filled 2018-05-22 (×2): qty 1

## 2018-05-22 MED ORDER — BISACODYL 5 MG PO TBEC: 5.0000 mg | DELAYED_RELEASE_TABLET | Freq: Every day | ORAL | Status: DC | PRN

## 2018-05-22 MED ORDER — ONDANSETRON HCL 4 MG PO TABS: 4.0000 mg | ORAL_TABLET | Freq: Four times a day (QID) | ORAL | Status: DC | PRN

## 2018-05-22 MED ORDER — ALBUTEROL SULFATE (2.5 MG/3ML) 0.083% IN NEBU: 2.5000 mg | INHALATION_SOLUTION | RESPIRATORY_TRACT | Status: DC | PRN

## 2018-05-22 NOTE — ED Provider Notes (Signed)
[facility] Emergency Department Provider Note   ____________________________________________    I have reviewed the triage vital signs and the nursing notes.   HISTORY  Chief Complaint Chest Pain     HPI Chelsea Ramsey is a 52 y.o. female who presents with complaints of chest pain and dizziness.  In actuality appears to be more epigastric in nature.  She is concerned because she is scheduled for mitral valve replacement in 1 week.  Recent heart cath for preop clearance was normal per patient.  She also states she has had 3 days of black stools did take Pepto-Bismol yesterday but none prior to that.  She is on blood thinners.  No fevers or chills.  Does take omeprazole for GERD.  Has not syncopized.  No shortness of breath reported   Past Medical History:  Diagnosis Date  . Allergy    seasonal  . Anxiety   . Arthritis    Right Knee  . Asthma   . CHF (congestive heart failure) ([place])   . COPD (chronic obstructive pulmonary disease) ([place])   . Coronary artery disease    Leaky heart valve  . Fibromyalgia   . GERD (gastroesophageal reflux disease)   . Hypertension   . Pneumonia   . PUD (peptic ulcer disease)   . Pulmonary HTN ([place])   . Rheumatic fever/heart disease   . Sleep apnea     Patient Active Problem List   Diagnosis Date Noted  . A-fib ([place]) 04/22/2018  . COPD (chronic obstructive pulmonary disease) ([place]) 12/21/2017  . COPD (chronic obstructive pulmonary disease) with acute bronchitis ([place]) 12/20/2017  . Primary localized osteoarthritis of right knee 09/04/2016    Past Surgical History:  Procedure Laterality Date  . ADENOIDECTOMY    . MULTIPLE TOOTH EXTRACTIONS    . TONSILLECTOMY    . TOTAL KNEE ARTHROPLASTY Right 09/04/2016   Procedure: TOTAL KNEE ARTHROPLASTY; with lateral release;  Surgeon: Earlie Server, MD;  Location: [place];  Service: Orthopedics;  Laterality: Right;    Prior to Admission medications   Medication Sig  Start Date End Date Taking? Authorizing Provider  butalbital-acetaminophen-caffeine (FIORICET, ESGIC) [phone] MG tablet Take 1 tablet by mouth every 6 (six) hours as needed for headache. 04/25/18  Yes Vaughan Basta, MD  carvedilol (COREG) 6.25 MG tablet Take 1 tablet (6.25 mg total) by mouth 2 (two) times daily with a meal. 04/25/18  Yes Vaughan Basta, MD  diltiazem (CARDIZEM CD) 240 MG 24 hr capsule Take 1 capsule (240 mg total) by mouth daily. 04/26/18  Yes Vaughan Basta, MD  gabapentin (NEURONTIN) 300 MG capsule Take 600 mg by mouth 3 (three) times daily.  07/22/15  Yes [provider]  omeprazole (PRILOSEC) 20 MG capsule Take 40 mg by mouth 2 (two) times daily. 02/18/16  Yes [provider]  albuterol (PROVENTIL HFA;VENTOLIN HFA) 108 (90 Base) MCG/ACT inhaler Inhale 2 puffs into the lungs every 6 (six) hours as needed for shortness of breath. 02/26/16   [provider]  allopurinol (ZYLOPRIM) 300 MG tablet Take 1 tablet (300 mg total) by mouth daily. Patient not taking: Reported on 05/22/2018 02/24/18   Versie Starks, PA-C  ARTIFICIAL TEAR OP Apply 2 drops to eye daily as needed (dry eyes).    [provider]  budesonide-formoterol (SYMBICORT) 160-4.5 MCG/ACT inhaler Inhale 2 puffs into the lungs 2 (two) times daily. 02/26/16   [provider]  chlorpheniramine-HYDROcodone (TUSSIONEX) 10-8 MG/5ML SUER Take 5 mLs by mouth  every 12 (twelve) hours as needed for cough. Patient not taking: Reported on 05/22/2018 04/25/18   Vaughan Basta, MD  colchicine 0.6 MG tablet Take 1 tablet (0.6 mg total) by mouth daily. Patient not taking: Reported on 05/22/2018 02/24/18 02/24/19  Caryn Section Linden Dolin, PA-C  diphenhydramine-acetaminophen (TYLENOL PM) 25-500 MG TABS tablet Take 2 tablets by mouth at bedtime as needed (sleep).     [provider]  furosemide (LASIX) 80 MG tablet Take 80 mg by mouth. 07/31/15   [provider]    guaiFENesin-dextromethorphan (ROBITUSSIN DM) 100-10 MG/5ML syrup Take 5 mLs by mouth every 6 (six) hours as needed for cough. 04/25/18   Vaughan Basta, MD  ipratropium-albuterol (DUONEB) 0.5-2.5 (3) MG/3ML SOLN Inhale 3 mL by nebulization twice daily. 02/26/16 12/20/18  [provider]  oxyCODONE-acetaminophen (PERCOCET/ROXICET) 5-325 MG tablet Take 1 tablet by mouth every 6 (six) hours as needed for severe pain. 04/25/18   Vaughan Basta, MD  potassium chloride (K-DUR) 10 MEQ tablet Take 1 tablet (10 mEq total) by mouth daily. Patient not taking: Reported on 04/22/2018 02/24/18   Versie Starks, PA-C  rivaroxaban (XARELTO) 20 MG TABS tablet Take 1 tablet (20 mg total) by mouth daily with supper. 04/25/18   Vaughan Basta, MD  vortioxetine HBr (TRINTELLIX) 10 MG TABS Take 10 mg by mouth daily as needed (mood swings /anxiety).    [provider]  zolpidem (AMBIEN) 10 MG tablet Take 10 mg by mouth at bedtime.    [provider]     Allergies Aspirin; Flexeril [cyclobenzaprine]; Morphine and related; Trazamine [trazodone & diet manage prod]; Codeine; and Tramadol  Family History  Problem Relation Age of Onset  . COPD Mother   . Cancer Mother        Bone  . Asthma Mother   . Congestive Heart Failure Father     Social History Social History   Tobacco Use  . Smoking status: Current Some Day Smoker    Packs/day: 0.50    Types: Cigarettes  . Smokeless tobacco: Never Used  Substance Use Topics  . Alcohol use: Yes    Alcohol/week: 1.8 oz    Types: 3 Cans of beer per week    Comment: 16 oz per week  . Drug use: Not Currently    Comment: Previous use of cocaine and marijuana last use 07/31/16     Review of Systems  Constitutional: Positive dizziness Eyes: No visual changes.  ENT: No sore throat. Cardiovascular: As above Respiratory: Denies shortness of breath. Gastrointestinal: Positive nausea Genitourinary: Negative for  dysuria. Musculoskeletal: Negative for back pain. Skin: Negative for rash. Neurological: Negative for headaches    ____________________________________________   PHYSICAL EXAM:  VITAL SIGNS: ED Triage Vitals  Enc Vitals Group     BP 05/22/18 0909 126/72     Pulse Rate 05/22/18 0909 (!) 104     Resp 05/22/18 0909 16     Temp 05/22/18 0909 98.9 F (37.2 C)     Temp Source 05/22/18 0909 Oral     SpO2 05/22/18 0909 100 %     Weight 05/22/18 0910 122.9 kg (271 lb)     Height 05/22/18 0910 1.626 m (5\' 4" )     Head Circumference --      Peak Flow --      Pain Score 05/22/18 0910 9     Pain Loc --      Pain Edu? --      Excl. in [place]? --  Constitutional: Alert and oriented.  Eyes: Conjunctivae are pale Head: Atraumatic. Nose: No congestion/rhinnorhea. Mouth/Throat: Mucous membranes are moist.    Cardiovascular: Normal rate, regular rhythm. Grossly normal heart sounds.  Good peripheral circulation. Respiratory: Normal respiratory effort.  No retractions. Lungs CTAB. Gastrointestinal: Soft and nontender. No distention.  Black melanotic stool reported  Musculoskeletal:  Warm and well perfused Neurologic:  Normal speech and language. No gross focal neurologic deficits are appreciated.  Skin:  Skin is warm, dry and intact. No rash noted. Psychiatric: Mood and affect are normal. Speech and behavior are normal.  ____________________________________________   LABS (all labs ordered are listed, but only abnormal results are displayed)  Labs Reviewed  CBC - Abnormal; Notable for the following components:      Result Value   WBC 13.0 (*)    RBC 2.76 (*)    Hemoglobin 6.8 (*)    HCT 21.4 (*)    MCV 77.7 (*)    MCH 24.6 (*)    MCHC 31.6 (*)    RDW 18.5 (*)    All other components within normal limits  COMPREHENSIVE METABOLIC PANEL - Abnormal; Notable for the following components:   Potassium 2.9 (*)    Chloride 95 (*)    Glucose, Bld 103 (*)    Calcium 8.6 (*)    All  other components within normal limits  TROPONIN I  APTT  PROTIME-INR  PREGNANCY, URINE  POC URINE PREG, ED  TYPE AND SCREEN  PREPARE RBC (CROSSMATCH)  ABO/RH   ____________________________________________  EKG  ED ECG REPORT I, Lavonia Drafts, the attending physician, personally viewed and interpreted this ECG.  Date: 05/22/2018  Rhythm: normal sinus rhythm QRS Axis: normal Intervals: Mildly prolonged QT ST/T Wave abnormalities: normal Narrative Interpretation: no evidence of acute ischemia  ____________________________________________  RADIOLOGY   ____________________________________________   PROCEDURES  Procedure(s) performed: No  Procedures   Critical Care performed: yes  CRITICAL CARE Performed by: Lavonia Drafts   Total critical care time:30 minutes  Critical care time was exclusive of separately billable procedures and treating other patients.  Critical care was necessary to treat or prevent imminent or life-threatening deterioration.  Critical care was time spent personally by me on the following activities: development of treatment plan with patient and/or surrogate as well as nursing, discussions with consultants, evaluation of patient's response to treatment, examination of patient, obtaining history from patient or surrogate, ordering and performing treatments and interventions, ordering and review of laboratory studies, ordering and review of radiographic studies, pulse oximetry and re-evaluation of patient's condition.  ____________________________________________   INITIAL IMPRESSION / ASSESSMENT AND PLAN / ED COURSE  Pertinent labs & imaging results that were available during my care of the patient were reviewed by me and considered in my medical decision making (see chart for details).  Patient presents with complaints of chest and epigastric discomfort with nausea.  Also reports 3 days of melanotic stool, concerning for PUD/GI bleed.  Will  send type and screen, coags, start IV, check troponin, CBC and CMP and closely monitor  Hemoglobin is decreased 4 g from last hemoglobin consistent with GI bleed.  Discussed transfusion with the patient, she has given her consent.  Will give 1 unit of PRBC now, will require admission to the hospitalist service   ____________________________________________   FINAL CLINICAL IMPRESSION(S) / ED DIAGNOSES  Final diagnoses:  Gastrointestinal hemorrhage, unspecified gastrointestinal hemorrhage type  Atypical chest pain        Note:  This document was prepared using  Dragon Armed forces training and education officer and may include unintentional dictation errors.    Lavonia Drafts, MD 05/22/18 1049

## 2018-05-22 NOTE — H&P (Addendum)
[facility] [place] NAME: Chelsea Ramsey    MR#:  [id number]  DATE OF BIRTH:  05-04-1966  DATE OF ADMISSION:  05/22/2018  PRIMARY CARE PHYSICIAN: Dagmar Hait, MD   REQUESTING/REFERRING PHYSICIAN: Dr. Corky Downs  CHIEF COMPLAINT:   Chief Complaint  Patient presents with  . Chest Pain   Melena, dizziness, chest pain, shortness of breath and generalized weakness 3 days. HISTORY OF PRESENT ILLNESS:  Chelsea Ramsey  is a 52 y.o. female with a known history of multiple medical problems as below.  The patient was treated with A. fib with RVR last month and started Xarelto.  She has chronic respiratory failure on home oxygen 2 to 3 L.  She has had melena, dizziness, weakness, chest pain and shortness of breath for the past 3 days.  She was found hemoglobin 6.8 in the ED.  Previous hemoglobin was 10.7. PAST MEDICAL HISTORY:   Past Medical History:  Diagnosis Date  . Allergy    seasonal  . Anxiety   . Arthritis    Right Knee  . Asthma   . CHF (congestive heart failure) ([place])   . COPD (chronic obstructive pulmonary disease) ([place])   . Coronary artery disease    Leaky heart valve  . Fibromyalgia   . GERD (gastroesophageal reflux disease)   . Hypertension   . Pneumonia   . PUD (peptic ulcer disease)   . Pulmonary HTN ([place])   . Rheumatic fever/heart disease   . Sleep apnea     PAST SURGICAL HISTORY:   Past Surgical History:  Procedure Laterality Date  . ADENOIDECTOMY    . MULTIPLE TOOTH EXTRACTIONS    . TONSILLECTOMY    . TOTAL KNEE ARTHROPLASTY Right 09/04/2016   Procedure: TOTAL KNEE ARTHROPLASTY; with lateral release;  Surgeon: Earlie Server, MD;  Location: [facility];  Service: Orthopedics;  Laterality: Right;    SOCIAL HISTORY:   Social History   Tobacco Use  . Smoking status: Current Some Day Smoker    Packs/day: 0.50    Types: Cigarettes  . Smokeless tobacco: Never Used  Substance Use Topics  . Alcohol use: Yes   Alcohol/week: 1.8 oz    Types: 3 Cans of beer per week    Comment: 16 oz per week    FAMILY HISTORY:   Family History  Problem Relation Age of Onset  . COPD Mother   . Cancer Mother        Bone  . Asthma Mother   . Congestive Heart Failure Father     DRUG ALLERGIES:   Allergies  Allergen Reactions  . Aspirin Swelling  . Flexeril [Cyclobenzaprine] Swelling  . Morphine And Related Swelling  . Trazamine [Trazodone & Diet Manage Prod] Nausea And Vomiting  . Codeine Rash  . Tramadol Rash    REVIEW OF SYSTEMS:   Review of Systems  Constitutional: Positive for malaise/fatigue. Negative for chills and fever.  HENT: Negative for sore throat.   Eyes: Negative for blurred vision and double vision.  Respiratory: Positive for cough and shortness of breath. Negative for hemoptysis, wheezing and stridor.   Cardiovascular: Positive for chest pain. Negative for palpitations, orthopnea and leg swelling.  Gastrointestinal: Positive for melena. Negative for abdominal pain, blood in stool, constipation, diarrhea, nausea and vomiting.  Genitourinary: Negative for dysuria, flank pain and hematuria.  Musculoskeletal: Negative for back pain and joint pain.  Skin: Negative for rash.  Neurological: Positive for dizziness. Negative for sensory change,  focal weakness, seizures, loss of consciousness, weakness and headaches.  Endo/Heme/Allergies: Negative for polydipsia.  Psychiatric/Behavioral: Negative for depression. The patient is not nervous/anxious.     MEDICATIONS AT HOME:   Prior to Admission medications   Medication Sig Start Date End Date Taking? Authorizing Provider  albuterol (PROVENTIL HFA;VENTOLIN HFA) 108 (90 Base) MCG/ACT inhaler Inhale 2 puffs into the lungs at bedtime as needed for wheezing or shortness of breath.  02/26/16  Yes [provider]  budesonide-formoterol (SYMBICORT) 160-4.5 MCG/ACT inhaler Inhale 2 puffs into the lungs 2 (two) times daily. 02/26/16  Yes  [provider]  bumetanide (BUMEX) 1 MG tablet Take 3 mg by mouth 2 (two) times daily. 04/19/18  Yes [provider]  butalbital-acetaminophen-caffeine (FIORICET, ESGIC) 50-325-40 MG tablet Take 1 tablet by mouth every 6 (six) hours as needed for headache. 04/25/18  Yes Vaughan Basta, MD  carvedilol (COREG) 6.25 MG tablet Take 1 tablet (6.25 mg total) by mouth 2 (two) times daily with a meal. 04/25/18  Yes Vaughan Basta, MD  diltiazem (CARDIZEM CD) 240 MG 24 hr capsule Take 1 capsule (240 mg total) by mouth daily. 04/26/18  Yes Vaughan Basta, MD  diltiazem (CARDIZEM) 60 MG tablet Take 60 mg by mouth every 8 (eight) hours as needed (palpitations and heart rate over 100).   Yes [provider]  gabapentin (NEURONTIN) 300 MG capsule Take 600 mg by mouth 3 (three) times daily.  07/22/15  Yes [provider]  HYDROcodone-acetaminophen (NORCO) 10-325 MG tablet Take 1 tablet by mouth daily.   Yes [provider]  ipratropium-albuterol (DUONEB) 0.5-2.5 (3) MG/3ML SOLN Inhale 3 mL by nebulization three times daily. 02/26/16 12/20/18 Yes [provider]  omeprazole (PRILOSEC) 20 MG capsule Take 40 mg by mouth 2 (two) times daily. 02/18/16  Yes [provider]  rivaroxaban (XARELTO) 20 MG TABS tablet Take 1 tablet (20 mg total) by mouth daily with supper. 04/25/18  Yes Vaughan Basta, MD  zolpidem (AMBIEN) 10 MG tablet Take 10 mg by mouth daily as needed for sleep.    Yes [provider]  allopurinol (ZYLOPRIM) 300 MG tablet Take 1 tablet (300 mg total) by mouth daily. Patient not taking: Reported on 05/22/2018 02/24/18   Versie Starks, PA-C  chlorpheniramine-HYDROcodone (TUSSIONEX) 10-8 MG/5ML SUER Take 5 mLs by mouth every 12 (twelve) hours as needed for cough. Patient not taking: Reported on 05/22/2018 04/25/18   Vaughan Basta, MD  colchicine 0.6 MG tablet Take 1 tablet (0.6 mg total) by mouth  daily. Patient not taking: Reported on 05/22/2018 02/24/18 02/24/19  Caryn Section Linden Dolin, PA-C  guaiFENesin-dextromethorphan (ROBITUSSIN DM) 100-10 MG/5ML syrup Take 5 mLs by mouth every 6 (six) hours as needed for cough. Patient not taking: Reported on 05/22/2018 04/25/18   Vaughan Basta, MD  oxyCODONE-acetaminophen (PERCOCET/ROXICET) 5-325 MG tablet Take 1 tablet by mouth every 6 (six) hours as needed for severe pain. Patient not taking: Reported on 05/22/2018 04/25/18   Vaughan Basta, MD  potassium chloride (K-DUR) 10 MEQ tablet Take 1 tablet (10 mEq total) by mouth daily. Patient not taking: Reported on 04/22/2018 02/24/18   Versie Starks, PA-C      VITAL SIGNS:  Blood pressure 105/70, pulse 87, temperature 98.9 F (37.2 C), temperature source Oral, resp. rate 18, height 5\' 4"  (1.626 m), weight 271 lb (122.9 kg), last menstrual period 11/08/2009, SpO2 99 %.  PHYSICAL EXAMINATION:  Physical Exam  GENERAL:  51 y.o.-year-old patient lying in the bed with no acute  distress.  Morbid obesity. EYES: Pupils equal, round, reactive to light and accommodation. No scleral icterus. Extraocular muscles intact.  HEENT: Head atraumatic, normocephalic. Oropharynx and nasopharynx clear.  NECK:  Supple, no jugular venous distention. No thyroid enlargement, no tenderness.  LUNGS: Normal breath sounds bilaterally, no wheezing, rales,rhonchi or crepitation. No use of accessory muscles of respiration.  CARDIOVASCULAR: S1, S2 normal. No murmurs, rubs, or gallops.  ABDOMEN: Soft, nontender, nondistended. Bowel sounds present. No organomegaly or mass.  EXTREMITIES: No pedal edema, cyanosis, or clubbing.  NEUROLOGIC: Cranial nerves II through XII are intact. Muscle strength 5/5 in all extremities. Sensation intact. Gait not checked.  PSYCHIATRIC: The patient is alert and oriented x 3.  SKIN: No obvious rash, lesion, or ulcer.   LABORATORY PANEL:   CBC Recent Labs  Lab 05/22/18 0942  WBC 13.0*   HGB 6.8*  HCT 21.4*  PLT 279   ------------------------------------------------------------------------------------------------------------------  Chemistries  Recent Labs  Lab 05/22/18 0942  NA 136  K 2.9*  CL 95*  CO2 29  GLUCOSE 103*  BUN 10  CREATININE 0.47  CALCIUM 8.6*  AST 21  ALT 22  ALKPHOS 96  BILITOT 0.4   ------------------------------------------------------------------------------------------------------------------  Cardiac Enzymes Recent Labs  Lab 05/22/18 0942  TROPONINI <0.03   ------------------------------------------------------------------------------------------------------------------  RADIOLOGY:  Dg Chest 2 View  Result Date: 05/22/2018 CLINICAL DATA:  Smoker with scheduled heart surgery in 2 weeks at @[facility] . Hx of asthma, COPD, CHF, rheumatic fever, CAD, pulmonary HTNToday- chest pain and SOB EXAM: CHEST - 2 VIEW COMPARISON:  04/25/2018 FINDINGS: Cardiac silhouette is mildly enlarged. No mediastinal or hilar masses. No evidence of adenopathy. There are prominent bronchovascular markings bilaterally, similar to the prior exams. No convincing pulmonary edema. No evidence of pneumonia. No pleural effusion or pneumothorax. Skeletal structures are intact. IMPRESSION: No acute cardiopulmonary disease.  Mild stable cardiomegaly. Electronically Signed   By: Lajean Manes M.D.   On: 05/22/2018 09:48      IMPRESSION AND PLAN:   Acute GI bleeding The patient will be admitted to medical floor. Hold Xarelto, PRBC transfusion, Pepcid IV twice daily, GI consult.  Anemia due to acute blood loss secondary to GI bleeding. PRBC transfusion, follow-up hemoglobin.  Hypokalemia.  Give potassium IV, check magnesium level and follow-up BMP.  History of A. fib.  Hold Xarelto, continue Coreg and Cardizem.  History of chronic diastolic CHF.  Stable.  Hold Bumex due to low side blood pressure. COPD.  Stable.  Continue home nebulizer. Chronic respiratory failure,  continue home oxygen 2 to 3 L. Morbid obesity. Tobacco abuse.  Smoking cessation was counseled for 3 minutes.  All the records are reviewed and case discussed with ED provider. Management plans discussed with the patient, family and they are in agreement.  CODE STATUS: Full code  TOTAL TIME TAKING CARE OF THIS PATIENT: 53 minutes.    Demetrios Loll M.D on 05/22/2018 at 11:06 AM  Between 7am to 6pm - Pager - [phone]  After 6pm go to [email or web address] - Proofreader  [name]  Office  [phone]  CC: Primary care physician; Dagmar Hait, MD   Note: This dictation was prepared with Dragon dictation along with smaller phrase technology. Any transcriptional errors that result from this process are unin

## 2018-05-22 NOTE — Progress Notes (Signed)
MD made aware that pt is c/o chest pain r/t severe cough. Pt is very upset and crying, discussed with pt allergic reactions for Morphine vs Toradol and codeine, Pt is willing to take Morphine and Tussionex but requests Benadryl with it. MD has been paged. Will enter verbal orders for now and nursing staff will keep monitoring pt.

## 2018-05-22 NOTE — ED Notes (Signed)
Assisted pt back in bed from bathroom,and sent urine to lab. Gave pt a cup of ice and blanket.

## 2018-05-22 NOTE — ED Notes (Signed)
Lab ordered pregnancy test will run in lab.

## 2018-05-22 NOTE — ED Triage Notes (Signed)
Pt to ED via POV stating that she has been having chest pain for the past 3 days and. Pt states that she is also having black tarry stools. Pt states that she is having nausea as well. Pt is scheduled to have open heart surgery on July 1st. Pt appears anxious in triage.

## 2018-05-23 DIAGNOSIS — K922 Gastrointestinal hemorrhage, unspecified: Secondary | ICD-10-CM

## 2018-05-23 LAB — BASIC METABOLIC PANEL
ANION GAP: 8 (ref 5–15)
BUN: 12 mg/dL (ref 6–20)
CALCIUM: 8.6 mg/dL — AB (ref 8.9–10.3)
CHLORIDE: 95 mmol/L — AB (ref 101–111)
CO2: 30 mmol/L (ref 22–32)
Creatinine, Ser: 0.51 mg/dL (ref 0.44–1.00)
GFR calc Af Amer: >60 mL/min (ref >60)
GFR calc non Af Amer: >60 mL/min (ref >60)
Glucose, Bld: 111 mg/dL — ABNORMAL HIGH (ref 65–99)
POTASSIUM: 3.9 mmol/L (ref 3.5–5.1)
Sodium: 133 mmol/L — ABNORMAL LOW (ref 135–145)

## 2018-05-23 LAB — CBC
HCT: 21.7 % — ABNORMAL LOW (ref 35.0–47.0)
Hemoglobin: 7 g/dL — ABNORMAL LOW (ref 12.0–16.0)
MCH: 25.8 pg — AB (ref 26.0–34.0)
MCHC: 32.1 g/dL (ref 32.0–36.0)
MCV: 80.1 fL (ref 80.0–100.0)
Platelets: 246 10*3/uL (ref 150–440)
RBC: 2.71 MIL/uL — AB (ref 3.80–5.20)
RDW: 18.2 % — ABNORMAL HIGH (ref 11.5–14.5)
WBC: 10.4 10*3/uL (ref 3.6–11.0)

## 2018-05-23 LAB — PREPARE RBC (CROSSMATCH)

## 2018-05-23 MED ORDER — HYDROMORPHONE HCL 1 MG/ML IJ SOLN
0.5000 mg | INTRAMUSCULAR | Status: DC | PRN
  Administered 2018-05-23 – 2018-05-25 (×7): 0.5 mg via INTRAVENOUS
  Filled 2018-05-23 (×7): qty 1

## 2018-05-23 MED ORDER — SODIUM CHLORIDE 0.9% IV SOLUTION
Freq: Once | INTRAVENOUS | Status: AC
  Administered 2018-05-25: 12:00:00 via INTRAVENOUS

## 2018-05-23 NOTE — Progress Notes (Signed)
CPAP plugged into red outlet 

## 2018-05-23 NOTE — Progress Notes (Addendum)
[facility] NAME: Chelsea Ramsey    MR#:  [id number]  DATE OF BIRTH:  1966-08-26  SUBJECTIVE:  CHIEF COMPLAINT:   Chief Complaint  Patient presents with  . Chest Pain  wants to eat, no further bleed REVIEW OF SYSTEMS:  Review of Systems  Constitutional: Negative for chills, fever and weight loss.  HENT: Negative for nosebleeds and sore throat.   Eyes: Negative for blurred vision.  Respiratory: Negative for cough, shortness of breath and wheezing.   Cardiovascular: Negative for chest pain, orthopnea, leg swelling and PND.  Gastrointestinal: Positive for melena. Negative for abdominal pain, constipation, diarrhea, heartburn, nausea and vomiting.  Genitourinary: Negative for dysuria and urgency.  Musculoskeletal: Negative for back pain.  Skin: Negative for rash.  Neurological: Positive for dizziness. Negative for speech change, focal weakness and headaches.  Endo/Heme/Allergies: Does not bruise/bleed easily.  Psychiatric/Behavioral: Negative for depression.    DRUG ALLERGIES:   Allergies  Allergen Reactions  . Aspirin Swelling  . Flexeril [Cyclobenzaprine] Swelling  . Morphine And Related Swelling  . Trazamine [Trazodone & Diet Manage Prod] Nausea And Vomiting  . Codeine Rash  . Tramadol Rash   VITALS:  Blood pressure 126/66, pulse 80, temperature 98.2 F (36.8 C), temperature source Oral, resp. rate (!) 22, height 5\' 5"  (1.651 m), weight 120.7 kg (266 lb 3.2 oz), last menstrual period 11/08/2009, SpO2 98 %. PHYSICAL EXAMINATION:  Physical Exam  Constitutional: She is oriented to person, place, and time.  HENT:  Head: Normocephalic and atraumatic.  Eyes: Pupils are equal, round, and reactive to light. Conjunctivae and EOM are normal.  Neck: Normal range of motion. Neck supple. No tracheal deviation present. No thyromegaly present.  Cardiovascular: Normal rate, regular rhythm and normal heart sounds.    Pulmonary/Chest: Effort normal and breath sounds normal. No respiratory distress. She has no wheezes. She exhibits no tenderness.  Abdominal: Soft. Bowel sounds are normal. She exhibits no distension. There is no tenderness.  Musculoskeletal: Normal range of motion.  Neurological: She is alert and oriented to person, place, and time. No cranial nerve deficit.  Skin: Skin is warm and dry. No rash noted.   LABORATORY PANEL:  Female CBC Recent Labs  Lab 05/23/18 0416  WBC 10.4  HGB 7.0*  HCT 21.7*  PLT 246   ------------------------------------------------------------------------------------------------------------------ Chemistries  Recent Labs  Lab 05/22/18 0942 05/23/18 0416  NA 136 133*  K 2.9* 3.9  CL 95* 95*  CO2 29 30  GLUCOSE 103* 111*  BUN 10 12  CREATININE 0.47 0.51  CALCIUM 8.6* 8.6*  MG 1.7  --   AST 21  --   ALT 22  --   ALKPHOS 96  --   BILITOT 0.4  --    RADIOLOGY:  No results found. ASSESSMENT AND PLAN:    * Acute GI bleeding - await cardio clearance for GI to perform procedure. Cardio c/sed [employer], s/p 1 PRBC transfusion - will order 1 more PRBC as Hb still 7 (with underlying cardiac history), Pepcid IV twice daily, GI consult.  * Anemia due to acute blood loss secondary to GI bleeding. PRBC transfusion, follow-up hemoglobin.  * Hypokalemia: replete and recheck  * History of A. fib.  Hold Xarelto, continue Coreg and Cardizem.  History of chronic diastolic CHF.  Stable.  Hold Bumex due to low side blood pressure. COPD.  Stable.  Continue home nebulizer. Chronic respiratory failure, continue home oxygen 2 to 3 L. Morbid  obesity. Tobacco abuse.  Smoking cessation was counseled for 3 minutes by Dr Bridgett Larsson.       All the records are reviewed and case discussed with Care Management/Social Worker. Management plans discussed with the patient, Dr Allen Norris and they are in agreement.  CODE STATUS: Full Code  TOTAL TIME TAKING CARE OF THIS  PATIENT: 35 minutes.   More than 50% of the time was spent in counseling/coordination of care: YES  POSSIBLE D/C IN 2-3 DAYS, DEPENDING ON CLINICAL CONDITION. And cardio, gi eval   Max Sane M.D on 05/23/2018 at 7:24 PM  Between 7am to 6pm - Pager - [phone]  After 6pm go to [email or web address] - Proofreader  [facility]  Office  [phone]  CC: Primary care physician; Dagmar Hait, MD  Note: This dictation was prepared with Dragon dictation along with smaller phrase technology. Any transcriptional errors that result from this process are unintentional.

## 2018-05-23 NOTE — Progress Notes (Signed)
Received 1 unit prBC's,pain management,GI consultation done and awaiting cardiology clearance for intervention

## 2018-05-23 NOTE — Consult Note (Signed)
Chelsea Lame, MD [facility]  [address], [place], [place] [address] Phone: [phone] Fax : [phone]  Consultation  Referring Provider:     Dr. Manuella Ghazi Primary Care Physician:  Dagmar Hait, MD Primary Gastroenterologist:  unassigned         Reason for Consultation:     Melena  Date of Admission:  05/22/2018 Date of Consultation:  05/23/2018         HPI:   Chelsea Ramsey is a 52 y.o. female who was admitted with profound anemia and black stools.  The patient has a history of a baseline hemoglobin back in May of this year at 12.5 and 13 in September 2015.  The patient came in with a hemoglobin of 6.8 and was transfused 1 unit of blood and this morning the hemoglobin was 7.0.  The patient also reports that she had an episode of a large black bowel movement.  She also reports that she had severe abdominal pain at that time.  She reports the pain to be in the epigastric area.  The patient denies any NSAID use.  She also denies any previous GI bleeding.  There is no report of any unexplained weight loss.  The patient does have a history of mitral valve disease and reports that she was supposed to have open heart surgery on July 1.  The patient also reports that she was originally admitted with chest pain.  The patient had been on Xarelto for A. fib.  She is also on home oxygen for chronic respiratory failure.  The patient reports that her present symptoms have been present for approximately 3 days.  The patient also reports that she has increased her weight which usually means that she is retaining water.  Past Medical History:  Diagnosis Date  . Allergy    seasonal  . Anxiety   . Arthritis    Right Knee  . Asthma   . CHF (congestive heart failure) ([place])   . COPD (chronic obstructive pulmonary disease) ([place])   . Coronary artery disease    Leaky heart valve  . Fibromyalgia   . GERD (gastroesophageal reflux disease)   . Hypertension   . Pneumonia   . PUD (peptic ulcer  disease)   . Pulmonary HTN ([place])   . Rheumatic fever/heart disease   . Sleep apnea     Past Surgical History:  Procedure Laterality Date  . ADENOIDECTOMY    . MULTIPLE TOOTH EXTRACTIONS    . TONSILLECTOMY    . TOTAL KNEE ARTHROPLASTY Right 09/04/2016   Procedure: TOTAL KNEE ARTHROPLASTY; with lateral release;  Surgeon: Earlie Server, MD;  Location: [facility];  Service: Orthopedics;  Laterality: Right;    Prior to Admission medications   Medication Sig Start Date End Date Taking? Authorizing Provider  albuterol (PROVENTIL HFA;VENTOLIN HFA) 108 (90 Base) MCG/ACT inhaler Inhale 2 puffs into the lungs at bedtime as needed for wheezing or shortness of breath.  02/26/16  Yes [provider]  budesonide-formoterol (SYMBICORT) 160-4.5 MCG/ACT inhaler Inhale 2 puffs into the lungs 2 (two) times daily. 02/26/16  Yes [provider]  bumetanide (BUMEX) 1 MG tablet Take 3 mg by mouth 2 (two) times daily. 04/19/18  Yes [provider]  butalbital-acetaminophen-caffeine (FIORICET, ESGIC) 50-325-40 MG tablet Take 1 tablet by mouth every 6 (six) hours as needed for headache. 04/25/18  Yes Vaughan Basta, MD  carvedilol (COREG) 6.25 MG tablet Take 1 tablet (6.25 mg total) by mouth 2 (two) times  daily with a meal. 04/25/18  Yes Vaughan Basta, MD  diltiazem (CARDIZEM CD) 240 MG 24 hr capsule Take 1 capsule (240 mg total) by mouth daily. 04/26/18  Yes Vaughan Basta, MD  diltiazem (CARDIZEM) 60 MG tablet Take 60 mg by mouth every 8 (eight) hours as needed (palpitations and heart rate over 100).   Yes [provider]  gabapentin (NEURONTIN) 300 MG capsule Take 600 mg by mouth 3 (three) times daily.  07/22/15  Yes [provider]  HYDROcodone-acetaminophen (NORCO) 10-325 MG tablet Take 1 tablet by mouth daily.   Yes [provider]  ipratropium-albuterol (DUONEB) 0.5-2.5 (3) MG/3ML SOLN Inhale 3 mL by nebulization three times daily. 02/26/16  12/20/18 Yes [provider]  omeprazole (PRILOSEC) 20 MG capsule Take 40 mg by mouth 2 (two) times daily. 02/18/16  Yes [provider]  rivaroxaban (XARELTO) 20 MG TABS tablet Take 1 tablet (20 mg total) by mouth daily with supper. 04/25/18  Yes Vaughan Basta, MD  zolpidem (AMBIEN) 10 MG tablet Take 10 mg by mouth daily as needed for sleep.    Yes [provider]  allopurinol (ZYLOPRIM) 300 MG tablet Take 1 tablet (300 mg total) by mouth daily. Patient not taking: Reported on 05/22/2018 02/24/18   Versie Starks, PA-C  chlorpheniramine-HYDROcodone (TUSSIONEX) 10-8 MG/5ML SUER Take 5 mLs by mouth every 12 (twelve) hours as needed for cough. Patient not taking: Reported on 05/22/2018 04/25/18   Vaughan Basta, MD  colchicine 0.6 MG tablet Take 1 tablet (0.6 mg total) by mouth daily. Patient not taking: Reported on 05/22/2018 02/24/18 02/24/19  Caryn Section Linden Dolin, PA-C  guaiFENesin-dextromethorphan (ROBITUSSIN DM) 100-10 MG/5ML syrup Take 5 mLs by mouth every 6 (six) hours as needed for cough. Patient not taking: Reported on 05/22/2018 04/25/18   Vaughan Basta, MD  oxyCODONE-acetaminophen (PERCOCET/ROXICET) 5-325 MG tablet Take 1 tablet by mouth every 6 (six) hours as needed for severe pain. Patient not taking: Reported on 05/22/2018 04/25/18   Vaughan Basta, MD  potassium chloride (K-DUR) 10 MEQ tablet Take 1 tablet (10 mEq total) by mouth daily. Patient not taking: Reported on 04/22/2018 02/24/18   Versie Starks, PA-C    Family History  Problem Relation Age of Onset  . COPD Mother   . Cancer Mother        Bone  . Asthma Mother   . Congestive Heart Failure Father      Social History   Tobacco Use  . Smoking status: Current Some Day Smoker    Packs/day: 0.50    Types: Cigarettes  . Smokeless tobacco: Never Used  Substance Use Topics  . Alcohol use: Yes    Alcohol/week: 1.8 oz    Types: 3 Cans of beer per week    Comment: 16 oz per  week  . Drug use: Not Currently    Comment: Previous use of cocaine and marijuana last use 07/31/16     Allergies as of 05/22/2018 - Review Complete 05/22/2018  Allergen Reaction Noted  . Aspirin Swelling 06/08/2015  . Flexeril [cyclobenzaprine] Swelling 07/05/2015  . Morphine and related Swelling 06/08/2015  . Trazamine [trazodone & diet manage prod] Nausea And Vomiting 08/19/2016  . Codeine Rash 06/08/2015  . Tramadol Rash 06/08/2015    Review of Systems:    All systems reviewed and negative except where noted in HPI.   Physical Exam:  Vital signs in last 24 hours: Temp:  [97.5 F (36.4 C)-98.6 F (37 C)] 98.6 F (37 C) (06/24 1430)  Pulse Rate:  [75-94] 86 (06/24 1430) Resp:  [16-22] 22 (06/24 1430) BP: (117-132)/(55-80) 119/55 (06/24 1430) SpO2:  [96 %-100 %] 98 % (06/24 1443) Last BM Date: 05/20/18 General:   Pleasant, cooperative in NAD Head:  Normocephalic and atraumatic. Eyes:   No icterus.   Conjunctiva pink. PERRLA. Ears:  Normal auditory acuity. Neck:  Supple; no masses or thyroidomegaly Lungs: Respirations even and unlabored. Lungs clear to auscultation bilaterally.   No wheezes, crackles, or rhonchi.  Heart: Irregular rate and rhythm;  Without murmur, clicks, rubs or gallops Abdomen:   Obese soft, nondistended, positive epicritic tenderness. Normal bowel sounds. No appreciable masses or hepatomegaly.  No rebound or guarding.  Rectal:  Not performed. Msk:  Symmetrical without gross deformities.    Extremities:  Without edema, cyanosis or clubbing. Neurologic:  Alert and oriented x3;  grossly normal neurologically. Skin:  Intact without significant lesions or rashes. Cervical Nodes:  No significant cervical adenopathy. Psych:  Alert and cooperative. Normal affect.  LAB RESULTS: Recent Labs    05/22/18 0942 05/23/18 0416  WBC 13.0* 10.4  HGB 6.8* 7.0*  HCT 21.4* 21.7*  PLT 279 246   BMET Recent Labs    05/22/18 0942 05/23/18 0416  NA 136 133*  K  2.9* 3.9  CL 95* 95*  CO2 29 30  GLUCOSE 103* 111*  BUN 10 12  CREATININE 0.47 0.51  CALCIUM 8.6* 8.6*   LFT Recent Labs    05/22/18 0942  PROT 7.5  ALBUMIN 3.7  AST 21  ALT 22  ALKPHOS 96  BILITOT 0.4   PT/INR Recent Labs    05/22/18 0942  LABPROT 13.5  INR 1.04    STUDIES: Dg Chest 2 View  Result Date: 05/22/2018 CLINICAL DATA:  Smoker with scheduled heart surgery in 2 weeks at @[facility] . Hx of asthma, COPD, CHF, rheumatic fever, CAD, pulmonary HTNToday- chest pain and SOB EXAM: CHEST - 2 VIEW COMPARISON:  04/25/2018 FINDINGS: Cardiac silhouette is mildly enlarged. No mediastinal or hilar masses. No evidence of adenopathy. There are prominent bronchovascular markings bilaterally, similar to the prior exams. No convincing pulmonary edema. No evidence of pneumonia. No pleural effusion or pneumothorax. Skeletal structures are intact. IMPRESSION: No acute cardiopulmonary disease.  Mild stable cardiomegaly. Electronically Signed   By: Lajean Manes M.D.   On: 05/22/2018 09:48      Impression / Plan:   Assessment: Active Problems:   GIB (gastrointestinal bleeding)   MANIAH NADING is a 52 y.o. y/o female with with a history of mitral valve disease and is supposed to have surgery in July 1.  The patient came in with chest pain and was found to have severe anemia after reporting that she had black stools.  The patient has had a increase in her hemoglobin of 0.2 after a unit of blood from 6.8 to 7.0.  The patient's chest pain is likely due to demand ischemia.  She denies chest pain at the present time.  Plan:  The patient should undergo an upper endoscopy when cleared by cardiology.  The patient is at high risk with her mitral valve disease and a report of chronic lung disease with chest pain on admission.  If the patient is cleared by cardiology we will proceed with an EGD.  Until then transfuse as needed.  The patient has had no further episodes of melena since 3 days ago and  may have stopped bleeding.  The patient has been explained the plan and agrees with it.  Thank you for involving me in the care of this patient.      LOS: 1 day   Chelsea Lame, MD  05/23/2018, 3:11 PM    Note: This dictation was prepared with Dragon dictation along with smaller phrase technology. Any transcriptional errors that result from this process are unintentional.

## 2018-05-23 NOTE — Plan of Care (Signed)
  Problem: Pain Managment: Goal: General experience of comfort will improve Outcome: Progressing   Problem: Safety: Goal: Ability to remain free from injury will improve Outcome: Progressing   Problem: Education: Goal: Ability to identify signs and symptoms of gastrointestinal bleeding will improve Outcome: Progressing   Problem: Fluid Volume: Goal: Will show no signs and symptoms of excessive bleeding Outcome: Progressing

## 2018-05-23 NOTE — Care Management (Signed)
Patient admitted with GI bleed. Patient to be transfused.  Patient is scheduled for REPLACE MITRAL VALV W/CARDIOPULMONARY BYPASS at [facility] on 05/30/18.  Patient has chronic Home O2 through [facility] home care.  Patient was started on outpatient Xarelto in May of 2019

## 2018-05-24 DIAGNOSIS — R112 Nausea with vomiting, unspecified: Secondary | ICD-10-CM

## 2018-05-24 LAB — CBC
HEMATOCRIT: 25 % — AB (ref 35.0–47.0)
HEMOGLOBIN: 8 g/dL — AB (ref 12.0–16.0)
MCH: 25.9 pg — ABNORMAL LOW (ref 26.0–34.0)
MCHC: 32.1 g/dL (ref 32.0–36.0)
MCV: 80.5 fL (ref 80.0–100.0)
PLATELETS: 241 10*3/uL (ref 150–440)
RBC: 3.1 MIL/uL — ABNORMAL LOW (ref 3.80–5.20)
RDW: 18 % — AB (ref 11.5–14.5)
WBC: 9.4 10*3/uL (ref 3.6–11.0)

## 2018-05-24 LAB — TYPE AND SCREEN
ABO/RH(D): B POS
ANTIBODY SCREEN: NEGATIVE
UNIT DIVISION: 0
Unit division: 0

## 2018-05-24 LAB — BASIC METABOLIC PANEL
ANION GAP: 7 (ref 5–15)
BUN: 14 mg/dL (ref 6–20)
CALCIUM: 8.9 mg/dL (ref 8.9–10.3)
CO2: 29 mmol/L (ref 22–32)
CREATININE: 0.58 mg/dL (ref 0.44–1.00)
Chloride: 97 mmol/L — ABNORMAL LOW (ref 98–111)
GLUCOSE: 116 mg/dL — AB (ref 70–99)
Potassium: 5 mmol/L (ref 3.5–5.1)
Sodium: 133 mmol/L — ABNORMAL LOW (ref 135–145)

## 2018-05-24 LAB — BPAM RBC
BLOOD PRODUCT EXPIRATION DATE: 201907172359
Blood Product Expiration Date: 201907052359
ISSUE DATE / TIME: 201906231339
ISSUE DATE / TIME: 201906241401
Unit Type and Rh: 7300
Unit Type and Rh: 7300

## 2018-05-24 MED ORDER — NICOTINE 21 MG/24HR TD PT24
21.0000 mg | MEDICATED_PATCH | Freq: Every day | TRANSDERMAL | Status: DC
  Administered 2018-05-24 – 2018-05-25 (×2): 21 mg via TRANSDERMAL
  Filled 2018-05-24 (×2): qty 1

## 2018-05-24 MED ORDER — BUMETANIDE 1 MG PO TABS
2.0000 mg | ORAL_TABLET | Freq: Two times a day (BID) | ORAL | Status: DC
  Administered 2018-05-24 – 2018-05-25 (×3): 2 mg via ORAL
  Filled 2018-05-24 (×4): qty 2

## 2018-05-24 NOTE — Consult Note (Signed)
Reason for Consult: Atrial fibrillation mitral regurgitation preop for EGD Referring Physician: Dr. Bridgett Larsson hospitalist. [facility] primary Cardiologist [facility]  Chelsea Ramsey is an 52 y.o. female.  HPI: Patient 52 year old obese black female with known atrial fibrillation paroxysmal known rheumatic heart disease resulting in severe to moderate mitral valve regurgitation.  Patient is preop for valve replacement at [facility] in the next few weeks.  Patient has been on anticoagulation with Xarelto for A. fib started having abdominal discomfort and developed what appeared to be melena on presentation hemoglobin was as low as 6 patient has since been transfused placed on supplemental oxygen denies any chest pain had some shortness of breath but now feels somewhat better  Past Medical History:  Diagnosis Date  . Allergy    seasonal  . Anxiety   . Arthritis    Right Knee  . Asthma   . CHF (congestive heart failure) ([place])   . COPD (chronic obstructive pulmonary disease) ([place])   . Coronary artery disease    Leaky heart valve  . Fibromyalgia   . GERD (gastroesophageal reflux disease)   . Hypertension   . Pneumonia   . PUD (peptic ulcer disease)   . Pulmonary HTN ([place])   . Rheumatic fever/heart disease   . Sleep apnea     Past Surgical History:  Procedure Laterality Date  . ADENOIDECTOMY    . MULTIPLE TOOTH EXTRACTIONS    . TONSILLECTOMY    . TOTAL KNEE ARTHROPLASTY Right 09/04/2016   Procedure: TOTAL KNEE ARTHROPLASTY; with lateral release;  Surgeon: Earlie Server, MD;  Location: [facility];  Service: Orthopedics;  Laterality: Right;    Family History  Problem Relation Age of Onset  . COPD Mother   . Cancer Mother        Bone  . Asthma Mother   . Congestive Heart Failure Father     Social History:  reports that she has been smoking cigarettes.  She has been smoking about 0.50 packs per day. She has never used smokeless tobacco. She reports that she drinks about 1.8 oz of alcohol per week. She  reports that she has current or past drug history.  Allergies:  Allergies  Allergen Reactions  . Aspirin Swelling  . Flexeril [Cyclobenzaprine] Swelling  . Morphine And Related Swelling  . Trazamine [Trazodone & Diet Manage Prod] Nausea And Vomiting  . Codeine Rash  . Tramadol Rash    Medications: I have reviewed the patient's current medications.  Results for orders placed or performed during the hospital encounter of 05/22/18 (from the past 48 hour(s))  Basic metabolic panel     Status: Abnormal   Collection Time: 05/23/18  4:16 AM  Result Value Ref Range   Sodium 133 (L) 135 - 145 mmol/L   Potassium 3.9 3.5 - 5.1 mmol/L   Chloride 95 (L) 101 - 111 mmol/L   CO2 30 22 - 32 mmol/L   Glucose, Bld 111 (H) 65 - 99 mg/dL   BUN 12 6 - 20 mg/dL   Creatinine, Ser 0.51 0.44 - 1.00 mg/dL   Calcium 8.6 (L) 8.9 - 10.3 mg/dL   GFR calc non Af Amer >60 >60 mL/min   GFR calc Af Amer >60 >60 mL/min    Comment: (NOTE) The eGFR has been calculated using the CKD EPI equation. This calculation has not been validated in all clinical situations. eGFR's persistently <60 mL/min signify possible Chronic Kidney Disease.    Anion gap 8 5 - 15    Comment: Performed  at [facility], [address] [place], [place] [address]  CBC     Status: Abnormal   Collection Time: 05/23/18  4:16 AM  Result Value Ref Range   WBC 10.4 3.6 - 11.0 K/uL   RBC 2.71 (L) 3.80 - 5.20 MIL/uL   Hemoglobin 7.0 (L) 12.0 - 16.0 g/dL   HCT 21.7 (L) 35.0 - 47.0 %   MCV 80.1 80.0 - 100.0 fL   MCH 25.8 (L) 26.0 - 34.0 pg   MCHC 32.1 32.0 - 36.0 g/dL   RDW 18.2 (H) 11.5 - 14.5 %   Platelets 246 150 - 440 K/uL    Comment: Performed at [facility], [address] [place], [place] [address]  Prepare RBC     Status: None   Collection Time: 05/23/18 10:00 AM  Result Value Ref Range   Order Confirmation      DUPLICATE REQUEST Performed at [facility], [address] [place], [place]  [address]   CBC     Status: Abnormal   Collection Time: 05/24/18  4:18 AM  Result Value Ref Range   WBC 9.4 3.6 - 11.0 K/uL   RBC 3.10 (L) 3.80 - 5.20 MIL/uL   Hemoglobin 8.0 (L) 12.0 - 16.0 g/dL   HCT 25.0 (L) 35.0 - 47.0 %   MCV 80.5 80.0 - 100.0 fL   MCH 25.9 (L) 26.0 - 34.0 pg   MCHC 32.1 32.0 - 36.0 g/dL   RDW 18.0 (H) 11.5 - 14.5 %   Platelets 241 150 - 440 K/uL    Comment: Performed at [facility], [address] [place], [place] [address]  Basic metabolic panel     Status: Abnormal   Collection Time: 05/24/18  4:18 AM  Result Value Ref Range   Sodium 133 (L) 135 - 145 mmol/L   Potassium 5.0 3.5 - 5.1 mmol/L   Chloride 97 (L) 98 - 111 mmol/L   CO2 29 22 - 32 mmol/L   Glucose, Bld 116 (H) 70 - 99 mg/dL   BUN 14 6 - 20 mg/dL   Creatinine, Ser 0.58 0.44 - 1.00 mg/dL   Calcium 8.9 8.9 - 10.3 mg/dL   GFR calc non Af Amer >60 >60 mL/min   GFR calc Af Amer >60 >60 mL/min    Comment: (NOTE) The eGFR has been calculated using the CKD EPI equation. This calculation has not been validated in all clinical situations. eGFR's persistently <60 mL/min signify possible Chronic Kidney Disease.    Anion gap 7 5 - 15    Comment: Performed at [facility], [address] [place], [place] [address]    No results found.  Review of Systems  Constitutional: Positive for diaphoresis and malaise/fatigue.  HENT: Positive for congestion.   Eyes: Negative.   Respiratory: Positive for cough and shortness of breath.   Cardiovascular: Positive for chest pain, palpitations, orthopnea and PND.  Gastrointestinal: Positive for abdominal pain, heartburn and melena.  Genitourinary: Negative.   Musculoskeletal: Negative.   Skin: Negative.   Neurological: Negative.   Endo/Heme/Allergies: Negative.   Psychiatric/Behavioral: Negative.    Blood pressure 107/69, pulse 83, temperature 97.8 F (36.6 C), temperature source Oral, resp. rate 18, height _0  (1.651 m), weight 275 lb  1.6 oz (124.8 kg), last menstrual period 11/08/2009, SpO2 93 %. Physical Exam  Nursing note and vitals reviewed. Constitutional: She is oriented to person, place, and time. She appears well-developed and well-nourished.  HENT:  Head: Normocephalic  and atraumatic.  Eyes: Pupils are equal, round, and reactive to light. Conjunctivae and EOM are normal.  Neck: Normal range of motion. Neck supple.  Cardiovascular: Normal rate and regular rhythm. Exam reveals gallop.  Murmur heard. Respiratory: Effort normal and breath sounds normal.  GI: Soft. Bowel sounds are normal.  Musculoskeletal: Normal range of motion.  Neurological: She is alert and oriented to person, place, and time. She has normal reflexes.  Skin: Skin is warm and dry.  Psychiatric: She has a normal mood and affect.    Assessment/Plan: Atrial fibrillation paroxysmal Anti coagulation with Xarelto Anemia GI bleed Preop for EGD Mitral regurgitation Preop for valve replacement Obesity History of CVA Congestive heart failure COPD Obstructive sleep apnea GERD Rheumatic heart disease . Plan Agree with admit to telemetry Agree with transfusions to hemoglobin of 8 or 9 Hold Xarelto Patient appears to be an acceptable risk for EGD procedure Continue supplemental oxygen Asked patient to refrain from tobacco abuse Recommend inhalers for asthma COPD Continue Cardizem and Coreg for rate control  Chelsea Ramsey D Rianna Lukes 05/24/2018, 5:59 PM

## 2018-05-24 NOTE — Progress Notes (Signed)
[place] [facility] [place] NAME: Chelsea Ramsey    MR#:  [id number]  DATE OF BIRTH:  03/21/1966  SUBJECTIVE:  CHIEF COMPLAINT:   Chief Complaint  Patient presents with  . Chest Pain  wants to eat, no further bleed, waiting for cardiac eval REVIEW OF SYSTEMS:  Review of Systems  Constitutional: Negative for chills, fever and weight loss.  HENT: Negative for nosebleeds and sore throat.   Eyes: Negative for blurred vision.  Respiratory: Negative for cough, shortness of breath and wheezing.   Cardiovascular: Negative for chest pain, orthopnea, leg swelling and PND.  Gastrointestinal: Positive for melena. Negative for abdominal pain, constipation, diarrhea, heartburn, nausea and vomiting.  Genitourinary: Negative for dysuria and urgency.  Musculoskeletal: Negative for back pain.  Skin: Negative for rash.  Neurological: Positive for dizziness. Negative for speech change, focal weakness and headaches.  Endo/Heme/Allergies: Does not bruise/bleed easily.  Psychiatric/Behavioral: Negative for depression.   DRUG ALLERGIES:   Allergies  Allergen Reactions  . Aspirin Swelling  . Flexeril [Cyclobenzaprine] Swelling  . Morphine And Related Swelling  . Trazamine [Trazodone & Diet Manage Prod] Nausea And Vomiting  . Codeine Rash  . Tramadol Rash   VITALS:  Blood pressure (!) 152/88, pulse 92, temperature 98.4 F (36.9 C), temperature source Oral, resp. rate 18, height 5\' 5"  (1.651 m), weight 124.8 kg (275 lb 1.6 oz), last menstrual period 11/08/2009, SpO2 100 %. PHYSICAL EXAMINATION:  Physical Exam  Constitutional: She is oriented to person, place, and time.  HENT:  Head: Normocephalic and atraumatic.  Eyes: Pupils are equal, round, and reactive to light. Conjunctivae and EOM are normal.  Neck: Normal range of motion. Neck supple. No tracheal deviation present. No thyromegaly present.  Cardiovascular: Normal rate, regular rhythm and normal heart  sounds.  Pulmonary/Chest: Effort normal and breath sounds normal. No respiratory distress. She has no wheezes. She exhibits no tenderness.  Abdominal: Soft. Bowel sounds are normal. She exhibits no distension. There is no tenderness.  Musculoskeletal: Normal range of motion.  Neurological: She is alert and oriented to person, place, and time. No cranial nerve deficit.  Skin: Skin is warm and dry. No rash noted.   LABORATORY PANEL:  Female CBC Recent Labs  Lab 05/24/18 0418  WBC 9.4  HGB 8.0*  HCT 25.0*  PLT 241   ------------------------------------------------------------------------------------------------------------------ Chemistries  Recent Labs  Lab 05/22/18 0942  05/24/18 0418  NA 136   < > 133*  K 2.9*   < > 5.0  CL 95*   < > 97*  CO2 29   < > 29  GLUCOSE 103*   < > 116*  BUN 10   < > 14  CREATININE 0.47   < > 0.58  CALCIUM 8.6*   < > 8.9  MG 1.7  --   --   AST 21  --   --   ALT 22  --   --   ALKPHOS 96  --   --   BILITOT 0.4  --   --    < > = values in this interval not displayed.   RADIOLOGY:  No results found. ASSESSMENT AND PLAN:    * Acute GI bleeding - cardio cleared for GI to perform procedure. D/w GI - as patient already ate before cardio eval - procedure plan for tomorrow Hold Xarelto, s/p 2 PRBC transfusion -  Hb 8.0. continue, Pepcid IV twice daily, GI following  * Anemia due to acute  blood loss secondary to GI bleeding. 2 PRBC transfusion, follow-up hemoglobin.  * Hypokalemia: repleted  * History of A. fib.  Hold Xarelto, continue Coreg and Cardizem.  History of chronic diastolic CHF.  Stable.  Hold Bumex due to low side blood pressure. COPD.  Stable.  Continue home nebulizer. Chronic respiratory failure, continue home oxygen 2 to 3 L. Morbid obesity. Tobacco abuse.  Smoking cessation was counseled for 3 minutes by Dr Bridgett Larsson.       All the records are reviewed and case discussed with Care Management/Social Worker. Management  plans discussed with the patient, Dr Allen Norris and they are in agreement.  CODE STATUS: Full Code  TOTAL TIME TAKING CARE OF THIS PATIENT: 35 minutes.   More than 50% of the time was spent in counseling/coordination of care: YES  POSSIBLE D/C IN 1-2 DAYS, DEPENDING ON CLINICAL CONDITION. And cardio, gi eval   Max Sane M.D on 05/24/2018 at 1:59 PM  Between 7am to 6pm - Pager - [phone]  After 6pm go to [email or web address] - Proofreader  [facility]  Office  [phone]  CC: Primary care physician; Dagmar Hait, MD  Note: This dictation was prepared with Dragon dictation along with smaller phrase technology. Any transcriptional errors that result from this process are unintentional.

## 2018-05-24 NOTE — Plan of Care (Signed)
One episode of nausea with coughing on this shift. Patient c/o pain and coughing throughout shift, PRN cough and pain medications given, see MAR.   Problem: Pain Managment: Goal: General experience of comfort will improve Outcome: Not Progressing   Problem: Nutrition: Goal: Adequate nutrition will be maintained Outcome: Progressing

## 2018-05-24 NOTE — Progress Notes (Signed)
Patient has no acute event overnight. Remained NSR with stable VS.

## 2018-05-24 NOTE — Progress Notes (Signed)
Patient ID: Chelsea Ramsey, female   DOB: 08-09-1966, 52 y.o.   MRN: [id number]  Asked to see patient as part of preoperative assessment for EGD.  Patient presented on chronic anticoagulation with Xarelto for A. fib and valvular disease and started having what appears to be GI bleeding hemoglobin dropped to as low as 6.  Patient states she has not taken Xarelto since Saturday evening which is close to 3 days.  Patient now appears to be in sinus rhythm because she has paroxysmal A. fib.  No active bleeding hemodynamically stable patient appears to be an acceptable surgical risk for EGD today Do not recommend any additional changes to modify her risk Depending on results of her EGD will consider restarting Xarelto Patient appears to be preop for mitral valve replacement is still considering whether bioprosthetic or mechanical She is considering a Maze procedure as part of the mitral valve replacement which may eliminate her need for anticoagulation for A. Fib.  And if she gets a bioprosthetic valve she may also eliminate anticoagulation for the valve. We will continue to follow full note to follow Patient again appears to be an acceptable surgical risk for EGD today Thanks Wandalee Klang

## 2018-05-24 NOTE — Plan of Care (Signed)
  Problem: Education: Goal: Knowledge of General Education information will improve Outcome: Progressing   Problem: Health Behavior/Discharge Planning: Goal: Ability to manage health-related needs will improve Outcome: Progressing   Problem: Clinical Measurements: Goal: Ability to maintain clinical measurements within normal limits will improve Outcome: Progressing Goal: Will remain free from infection Outcome: Progressing Goal: Diagnostic test results will improve Outcome: Progressing Goal: Respiratory complications will improve Outcome: Progressing Goal: Cardiovascular complication will be avoided Outcome: Progressing   Problem: Activity: Goal: Risk for activity intolerance will decrease Outcome: Progressing   Problem: Nutrition: Goal: Adequate nutrition will be maintained Outcome: Progressing   Problem: Coping: Goal: Level of anxiety will decrease Outcome: Progressing   Problem: Elimination: Goal: Will not experience complications related to bowel motility Outcome: Progressing Goal: Will not experience complications related to urinary retention Outcome: Progressing   Problem: Pain Managment: Goal: General experience of comfort will improve Outcome: Progressing   Problem: Safety: Goal: Ability to remain free from injury will improve Outcome: Progressing   Problem: Skin Integrity: Goal: Risk for impaired skin integrity will decrease Outcome: Progressing   Problem: Education: Goal: Ability to identify signs and symptoms of gastrointestinal bleeding will improve Outcome: Progressing   Problem: Bowel/Gastric: Goal: Will show no signs and symptoms of gastrointestinal bleeding Outcome: Progressing   Problem: Fluid Volume: Goal: Will show no signs and symptoms of excessive bleeding Outcome: Progressing   Problem: Clinical Measurements: Goal: Complications related to the disease process, condition or treatment will be avoided or minimized Outcome:  Progressing

## 2018-05-24 NOTE — Progress Notes (Signed)
  Lucilla Lame, MD [facility]   [address] [place], [place] [address] Phone: [phone] Fax : [phone]   Subjective: The patient continues to have nausea with abdominal pain.  The patient has not been seen by cardiology or at least there is no note from cardiology at this time giving clearance for an upper endoscopy.  The patient's hemoglobin has come up today.   Objective: Vital signs in last 24 hours: Vitals:   05/23/18 1931 05/24/18 0346 05/24/18 0349 05/24/18 0729  BP: 110/65 (!) 122/91 (!) 148/66 (!) 152/88  Pulse: 87 86 85 92  Resp: 18 17 18 18   Temp: 98.6 F (37 C) 98.1 F (36.7 C) 98.7 F (37.1 C) 98.4 F (36.9 C)  TempSrc: Oral  Oral Oral  SpO2: 94% 100% 95% 100%  Weight:   275 lb 1.6 oz (124.8 kg)   Height:       Weight change: 4 lb 1.6 oz (1.86 kg)  Intake/Output Summary (Last 24 hours) at 05/24/2018 1154 Last data filed at 05/24/2018 0756 Gross per 24 hour  Intake 650 ml  Output 1000 ml  Net -350 ml     Exam: General: Alert and orientated x3 appears in general discomfort   Lab Results: @LABTEST2 @ Micro Results: No results found for this or any previous visit (from the past 240 hour(s)). Studies/Results: No results found. Medications: I have reviewed the patient's current medications. Scheduled Meds: . sodium chloride   Intravenous Once  . bumetanide  2 mg Oral BID  . carvedilol  6.25 mg Oral BID WC  . diltiazem  240 mg Oral Daily  . fentaNYL (SUBLIMAZE) injection  12.5 mcg Intravenous Once  . gabapentin  600 mg Oral TID  . ipratropium-albuterol  3 mL Nebulization TID  . mouth rinse  15 mL Mouth Rinse BID  . mometasone-formoterol  2 puff Inhalation BID   Continuous Infusions: . famotidine (PEPCID) IV Stopped (05/24/18 1040)   PRN Meds:.acetaminophen **OR** acetaminophen, albuterol, bisacodyl, butalbital-acetaminophen-caffeine, chlorpheniramine-HYDROcodone, guaiFENesin-dextromethorphan, HYDROcodone-acetaminophen, HYDROmorphone  (DILAUDID) injection, ondansetron **OR** ondansetron (ZOFRAN) IV, senna-docusate, zolpidem   Assessment: Active Problems:   GIB (gastrointestinal bleeding)    Plan: This patient has nausea with abdominal pain and had a significant drop in hemoglobin with a history of black stools.  The patient continues to have abdominal pain and we are waiting for cardiology clearance prior to doing any endoscopic procedures.  If the patient is not cleared by cardiology for any endoscopic procedures then I would recommend treating the patient with a PPI.   LOS: 2 days   Lucilla Lame 05/24/2018, 11:54 AM

## 2018-05-25 ENCOUNTER — Inpatient Hospital Stay: Payer: Medicaid Other

## 2018-05-25 ENCOUNTER — Encounter
Admission: EM | Disposition: A | Discharge status: Home / Self Care | Payer: Self-pay | Source: Home / Self Care | Attending: Internal Medicine

## 2018-05-25 ENCOUNTER — Inpatient Hospital Stay: Payer: Medicaid Other | Admitting: Anesthesiology

## 2018-05-25 ENCOUNTER — Encounter: Payer: Self-pay | Admitting: *Deleted

## 2018-05-25 DIAGNOSIS — R112 Nausea with vomiting, unspecified: Secondary | ICD-10-CM

## 2018-05-25 DIAGNOSIS — K253 Acute gastric ulcer without hemorrhage or perforation: Secondary | ICD-10-CM

## 2018-05-25 HISTORY — PX: ESOPHAGOGASTRODUODENOSCOPY (EGD) WITH PROPOFOL: SHX5813

## 2018-05-25 LAB — EXPECTORATED SPUTUM ASSESSMENT W GRAM STAIN, RFLX TO RESP C

## 2018-05-25 LAB — EXPECTORATED SPUTUM ASSESSMENT W REFEX TO RESP CULTURE

## 2018-05-25 SURGERY — ESOPHAGOGASTRODUODENOSCOPY (EGD) WITH PROPOFOL
Anesthesia: General

## 2018-05-25 MED ORDER — LIDOCAINE HCL (CARDIAC) PF 100 MG/5ML IV SOSY
PREFILLED_SYRINGE | INTRAVENOUS | Status: DC | PRN
  Administered 2018-05-25: 60 mg via INTRAVENOUS

## 2018-05-25 MED ORDER — PANTOPRAZOLE SODIUM 40 MG PO TBEC
40.0000 mg | DELAYED_RELEASE_TABLET | Freq: Every day | ORAL | Status: DC
  Administered 2018-05-25: 40 mg via ORAL
  Filled 2018-05-25: qty 1

## 2018-05-25 MED ORDER — PANTOPRAZOLE SODIUM 40 MG PO TBEC: 40.0000 mg | DELAYED_RELEASE_TABLET | Freq: Two times a day (BID) | ORAL | 0 refills | Status: DC

## 2018-05-25 MED ORDER — PROPOFOL 10 MG/ML IV BOLUS
INTRAVENOUS | Status: DC | PRN
  Administered 2018-05-25: 70 mg via INTRAVENOUS

## 2018-05-25 MED ORDER — PROPOFOL 500 MG/50ML IV EMUL
INTRAVENOUS | Status: AC
Start: 2018-05-25 — End: ?
  Filled 2018-05-25: qty 50

## 2018-05-25 MED ORDER — BISACODYL 5 MG PO TBEC
10.0000 mg | DELAYED_RELEASE_TABLET | Freq: Every day | ORAL | Status: DC | PRN
  Administered 2018-05-25: 10 mg via ORAL
  Filled 2018-05-25: qty 2

## 2018-05-25 MED ORDER — HYDROCOD POLST-CPM POLST ER 10-8 MG/5ML PO SUER: 5.0000 mL | Freq: Three times a day (TID) | ORAL | 0 refills | Status: DC | PRN

## 2018-05-25 MED ORDER — SODIUM CHLORIDE 0.9 % IV SOLN
INTRAVENOUS | Status: DC | PRN
  Administered 2018-05-25: 12:00:00 via INTRAVENOUS

## 2018-05-25 MED ORDER — BISACODYL 5 MG PO TBEC: 10.0000 mg | DELAYED_RELEASE_TABLET | Freq: Every day | ORAL | 0 refills | Status: DC | PRN

## 2018-05-25 MED ORDER — DOCUSATE SODIUM 100 MG PO CAPS
100.0000 mg | ORAL_CAPSULE | Freq: Two times a day (BID) | ORAL | Status: DC
  Administered 2018-05-25: 100 mg via ORAL
  Filled 2018-05-25: qty 1

## 2018-05-25 MED ORDER — PROPOFOL 500 MG/50ML IV EMUL
INTRAVENOUS | Status: DC | PRN
  Administered 2018-05-25: 140 ug/kg/min via INTRAVENOUS

## 2018-05-25 NOTE — Progress Notes (Signed)
Patient reported small bowel movement. IV and tele removed from patient. Discharge instructions given to patient along with hard copy prescriptions. Verbalized understanding. No acute distress. Husband to transport patient home.

## 2018-05-25 NOTE — Anesthesia Post-op Follow-up Note (Signed)
Anesthesia QCDR form completed.        

## 2018-05-25 NOTE — Anesthesia Preprocedure Evaluation (Signed)
Anesthesia Evaluation  Patient identified by MRN, date of birth, ID band Patient awake    Reviewed: Allergy & Precautions, H&P , NPO status , Patient's Chart, lab work & pertinent test results, reviewed documented beta blocker date and time   History of Anesthesia Complications Negative for: history of anesthetic complications  Airway Mallampati: I  TM Distance: >3 FB Neck ROM: full    Dental  (+) Dental Advidsory Given, Edentulous Upper, Edentulous Lower   Pulmonary neg pulmonary ROS, shortness of breath, with exertion and Long-Term Oxygen Therapy, asthma , sleep apnea and Continuous Positive Airway Pressure Ventilation , COPD,  oxygen dependent, neg recent URI, Current Smoker,  Pulmonary HTN          Cardiovascular Exercise Tolerance: Good hypertension, (-) angina+ CAD and +CHF  (-) Past MI, (-) Cardiac Stents and (-) CABG + dysrhythmias Atrial Fibrillation + Valvular Problems/Murmurs (Will have MVR next month) MR      Neuro/Psych neg Seizures PSYCHIATRIC DISORDERS Anxiety  Neuromuscular disease (fibromyalgia)    GI/Hepatic Neg liver ROS, PUD, GERD  ,  Endo/Other  neg diabetesMorbid obesity  Renal/GU negative Renal ROS  negative genitourinary   Musculoskeletal   Abdominal   Peds  Hematology negative hematology ROS (+)   Anesthesia Other Findings Past Medical History: No date: Allergy     Comment:  seasonal No date: Anxiety No date: Arthritis     Comment:  Right Knee No date: Asthma No date: CHF (congestive heart failure) (HCC) No date: COPD (chronic obstructive pulmonary disease) (HCC) No date: Coronary artery disease     Comment:  Leaky heart valve No date: Fibromyalgia No date: GERD (gastroesophageal reflux disease) No date: Hypertension No date: Pneumonia No date: PUD (peptic ulcer disease) No date: Pulmonary HTN (HCC) No date: Rheumatic fever/heart disease No date: Sleep apnea   Reproductive/Obstetrics negative OB ROS                             Anesthesia Physical Anesthesia Plan  ASA: IV  Anesthesia Plan: General   Post-op Pain Management:    Induction: Intravenous  PONV Risk Score and Plan: 2 and Propofol infusion  Airway Management Planned: Nasal Cannula  Additional Equipment:   Intra-op Plan:   Post-operative Plan:   Informed Consent: I have reviewed the patients History and Physical, chart, labs and discussed the procedure including the risks, benefits and alternatives for the proposed anesthesia with the patient or authorized representative who has indicated his/her understanding and acceptance.   Dental Advisory Given  Plan Discussed with: Anesthesiologist, CRNA and Surgeon  Anesthesia Plan Comments:         Anesthesia Quick Evaluation

## 2018-05-25 NOTE — Transfer of Care (Signed)
Immediate Anesthesia Transfer of Care Note  Patient: Chelsea Ramsey  Procedure(s) Performed: ESOPHAGOGASTRODUODENOSCOPY (EGD) WITH PROPOFOL (N/A )  Patient Location: PACU  Anesthesia Type:General  Level of Consciousness: sedated  Airway & Oxygen Therapy: Patient Spontanous Breathing and Patient connected to nasal cannula oxygen  Post-op Assessment: Report given to RN and Post -op Vital signs reviewed and stable  Post vital signs: Reviewed and stable  Last Vitals:  Vitals Value Taken Time  BP 113/73 05/25/2018 12:21 PM  Temp    Pulse 96 05/25/2018 12:22 PM  Resp 16 05/25/2018 12:22 PM  SpO2 99 % 05/25/2018 12:22 PM  Vitals shown include unvalidated device data.  Last Pain:  Vitals:   05/25/18 1126  TempSrc: Tympanic  PainSc:       Patients Stated Pain Goal: 5 (07/57/32 2567)  Complications: No apparent anesthesia complications

## 2018-05-25 NOTE — Progress Notes (Signed)
The patient had EGD with a very small healing gastric ulcer that was superficial.  It is unlikely that this small gastric ulcer is causing this patient and her significant amount of nausea and abdominal pain.  I would consider a alternate source of her symptoms possibly her cardiac issues as the cause of her nausea.  I have also recommended that the patient be continued with a PPI.

## 2018-05-25 NOTE — Discharge Instructions (Signed)
Smoking cessation. Continue home O2 [place] 2-3L.

## 2018-05-25 NOTE — Discharge Summary (Signed)
[facility] NAME: Chelsea Ramsey    MR#:  [id number]  DATE OF BIRTH:  June 29, 1966  DATE OF ADMISSION:  05/22/2018   ADMITTING PHYSICIAN: Demetrios Loll, MD  DATE OF DISCHARGE: 05/25/2018 PRIMARY CARE PHYSICIAN: Dagmar Hait, MD   ADMISSION DIAGNOSIS:  Atypical chest pain [R07.89] Gastrointestinal hemorrhage, unspecified gastrointestinal hemorrhage type [K92.2] DISCHARGE DIAGNOSIS:  Active Problems:   GIB (gastrointestinal bleeding)   Intractable vomiting with nausea   Melena   Nausea and vomiting   Acute esophagogastric ulcer  SECONDARY DIAGNOSIS:   Past Medical History:  Diagnosis Date  . Allergy    seasonal  . Anxiety   . Arthritis    Right Knee  . Asthma   . CHF (congestive heart failure) ([place])   . COPD (chronic obstructive pulmonary disease) ([place])   . Coronary artery disease    Leaky heart valve  . Fibromyalgia   . GERD (gastroesophageal reflux disease)   . Hypertension   . Pneumonia   . PUD (peptic ulcer disease)   . Pulmonary HTN ([place])   . Rheumatic fever/heart disease   . Sleep apnea    HOSPITAL COURSE:  Chelsea Ramsey  is a 52 y.o. female with a known history of multiple medical problems as below.  The patient was treated with A. fib with RVR last month and started Xarelto.  She has chronic respiratory failure on home oxygen 2 to 3 L.  She has had melena, dizziness, weakness, chest pain and shortness of breath.   * Acute GI bleeding Hold Xarelto, s/p 2 PRBC transfusion -  Hb 8.0. continue, Pepcid IV twice daily, - cardio cleared for GI to perform procedure. EGD: small gastric ulcer. PPI per Dr. Allen Norris.  * Anemia due to acute blood loss secondary to GI bleeding. 2 PRBC transfusion, follow-up hemoglobin is stable.  No active bleeding.  * Hypokalemia: repleted and improved.  * History of A. fib. Hold Xarelto, continue Coreg and Cardizem.  History of chronic diastolic CHF.Stable. Resumed  Bumex. COPD. Stable. Continue home nebulizer.   Chronic respiratory failure, continue home oxygen 2 to 3 L. Morbid obesity. Tobacco abuse. Smoking cessation was counseled for 3 minutes by Dr Bridgett Larsson DISCHARGE CONDITIONS:  Stable, discharge to home today. CONSULTS OBTAINED:  Treatment Team:  Jonathon Bellows, MD Yolonda Kida, MD DRUG ALLERGIES:   Allergies  Allergen Reactions  . Aspirin Swelling  . Flexeril [Cyclobenzaprine] Swelling  . Morphine And Related Swelling  . Trazamine [Trazodone & Diet Manage Prod] Nausea And Vomiting  . Codeine Rash  . Tramadol Rash   DISCHARGE MEDICATIONS:   Allergies as of 05/25/2018      Reactions   Aspirin Swelling   Flexeril [cyclobenzaprine] Swelling   Morphine And Related Swelling   Trazamine [trazodone & Diet Manage Prod] Nausea And Vomiting   Codeine Rash   Tramadol Rash      Medication List    STOP taking these medications   allopurinol 300 MG tablet Commonly known as:  ZYLOPRIM   guaiFENesin-dextromethorphan 100-10 MG/5ML syrup Commonly known as:  ROBITUSSIN DM   omeprazole 20 MG capsule Commonly known as:  PRILOSEC   oxyCODONE-acetaminophen 5-325 MG tablet Commonly known as:  PERCOCET/ROXICET   potassium chloride 10 MEQ tablet Commonly known as:  K-DUR   rivaroxaban 20 MG Tabs tablet Commonly known as:  XARELTO     TAKE these medications   albuterol 108 (90 Base) MCG/ACT inhaler Commonly known as:  PROVENTIL  HFA;VENTOLIN HFA Inhale 2 puffs into the lungs at bedtime as needed for wheezing or shortness of breath.   bisacodyl 5 MG EC tablet Commonly known as:  DULCOLAX Take 2 tablets (10 mg total) by mouth daily as needed for moderate constipation.   bumetanide 1 MG tablet Commonly known as:  BUMEX Take 3 mg by mouth 2 (two) times daily.   butalbital-acetaminophen-caffeine 50-325-40 MG tablet Commonly known as:  FIORICET, ESGIC Take 1 tablet by mouth every 6 (six) hours as needed for headache.   carvedilol  6.25 MG tablet Commonly known as:  COREG Take 1 tablet (6.25 mg total) by mouth 2 (two) times daily with a meal.   chlorpheniramine-HYDROcodone 10-8 MG/5ML Suer Commonly known as:  TUSSIONEX Take 5 mLs by mouth 3 (three) times daily as needed for cough. What changed:  when to take this   colchicine 0.6 MG tablet Take 1 tablet (0.6 mg total) by mouth daily.   diltiazem 240 MG 24 hr capsule Commonly known as:  CARDIZEM CD Take 1 capsule (240 mg total) by mouth daily.   diltiazem 60 MG tablet Commonly known as:  CARDIZEM Take 60 mg by mouth every 8 (eight) hours as needed (palpitations and heart rate over 100).   gabapentin 300 MG capsule Commonly known as:  NEURONTIN Take 600 mg by mouth 3 (three) times daily.   HYDROcodone-acetaminophen 10-325 MG tablet Commonly known as:  NORCO Take 1 tablet by mouth daily.   ipratropium-albuterol 0.5-2.5 (3) MG/3ML Soln Commonly known as:  DUONEB Inhale 3 mL by nebulization three times daily.   pantoprazole 40 MG tablet Commonly known as:  PROTONIX Take 1 tablet (40 mg total) by mouth 2 (two) times daily before a meal.   SYMBICORT 160-4.5 MCG/ACT inhaler Generic drug:  budesonide-formoterol Inhale 2 puffs into the lungs 2 (two) times daily.   zolpidem 10 MG tablet Commonly known as:  AMBIEN Take 10 mg by mouth daily as needed for sleep.        DISCHARGE INSTRUCTIONS:  See AVS. If you experience worsening of your admission symptoms, develop shortness of breath, life threatening emergency, suicidal or homicidal thoughts you must seek medical attention immediately by calling 911 or calling your MD immediately  if symptoms less severe.  You Must read complete instructions/literature along with all the possible adverse reactions/side effects for all the Medicines you take and that have been prescribed to you. Take any new Medicines after you have completely understood and accpet all the possible adverse reactions/side effects.    Please note  You were cared for by a hospitalist during your hospital stay. If you have any questions about your discharge medications or the care you received while you were in the hospital after you are discharged, you can call the unit and asked to speak with the hospitalist on call if the hospitalist that took care of you is not available. Once you are discharged, your primary care physician will handle any further medical issues. Please note that NO REFILLS for any discharge medications will be authorized once you are discharged, as it is imperative that you return to your primary care physician (or establish a relationship with a primary care physician if you do not have one) for your aftercare needs so that they can reassess your need for medications and monitor your lab values.    On the day of Discharge:  VITAL SIGNS:  Blood pressure 122/88, pulse 94, temperature 98 F (36.7 C), temperature source Oral, resp. rate 18,  height 5\' 5"  (1.651 m), weight 275 lb (124.7 kg), last menstrual period 11/08/2009, SpO2 100 %. PHYSICAL EXAMINATION:  GENERAL:  52 y.o.-year-old patient lying in the bed with no acute distress.  Morbid obesity. EYES: Pupils equal, round, reactive to light and accommodation. No scleral icterus. Extraocular muscles intact.  HEENT: Head atraumatic, normocephalic. Oropharynx and nasopharynx clear.  NECK:  Supple, no jugular venous distention. No thyroid enlargement, no tenderness.  LUNGS: Normal breath sounds bilaterally, no wheezing, rales,rhonchi or crepitation. No use of accessory muscles of respiration.  CARDIOVASCULAR: S1, S2 normal. No murmurs, rubs, or gallops.  ABDOMEN: Soft, non-tender, non-distended. Bowel sounds present. No organomegaly or mass.  EXTREMITIES: No pedal edema, cyanosis, or clubbing.  NEUROLOGIC: Cranial nerves II through XII are intact. Muscle strength 5/5 in all extremities. Sensation intact. Gait not checked.  PSYCHIATRIC: The patient is alert  and oriented x 3.  SKIN: No obvious rash, lesion, or ulcer.  DATA REVIEW:   CBC Recent Labs  Lab 05/24/18 0418  WBC 9.4  HGB 8.0*  HCT 25.0*  PLT 241    Chemistries  Recent Labs  Lab 05/22/18 0942  05/24/18 0418  NA 136   < > 133*  K 2.9*   < > 5.0  CL 95*   < > 97*  CO2 29   < > 29  GLUCOSE 103*   < > 116*  BUN 10   < > 14  CREATININE 0.47   < > 0.58  CALCIUM 8.6*   < > 8.9  MG 1.7  --   --   AST 21  --   --   ALT 22  --   --   ALKPHOS 96  --   --   BILITOT 0.4  --   --    < > = values in this interval not displayed.     Microbiology Results  Results for orders placed or performed during the hospital encounter of 12/20/17  Urine Culture     Status: Abnormal   Collection Time: 12/20/17 11:55 AM  Result Value Ref Range Status   Specimen Description   Final    URINE, RANDOM Performed at [facility], [address], [place], [place] [address]    Special Requests   Final    NONE Performed at [facility], [address] [place], [place] [address]    Culture (A)  Final    <10,000 COLONIES/mL INSIGNIFICANT GROWTH Performed at [facility] [address] [place], [place] [address]    Report Status 12/22/2017 FINAL  Final    RADIOLOGY:  Dg Chest Port 1 View  Result Date: 05/25/2018 CLINICAL DATA:  Follow-up chest pain EXAM: PORTABLE CHEST 1 VIEW COMPARISON:  05/22/2018 FINDINGS: Cardiac shadow is enlarged but accentuated by the portable technique. The overall appearance is stable. The lungs are well aerated bilaterally. No focal infiltrate or sizable effusion is seen. No bony abnormality is noted. IMPRESSION: No active disease. Electronically Signed   By: Inez Catalina M.D.   On: 05/25/2018 12:15     Management plans discussed with the patient, family and they are in agreement.  CODE STATUS: Full Code   TOTAL TIME TAKING CARE OF THIS PATIENT: 36 minutes.    Demetrios Loll M.D on 05/25/2018 at 4:49 PM  Between 7am to 6pm - Pager -  [phone]  After 6pm go to [email or web address] - [facility]  Office  [phone]  CC: Primary care  physician; Dagmar Hait, MD   Note: This dictation was prepared with Dragon dictation along with smaller phrase technology. Any transcriptional errors that result from this process are unintentional.

## 2018-05-25 NOTE — Op Note (Signed)
[facility] Patient Name: Chelsea Ramsey Procedure Date: 05/25/2018 11:35 AM MRN: [id number] Account #: [id number] Date of Birth: Apr 14, 1966 Admit Type: Inpatient Age: 52 Room: [facility] ENDO ROOM 3 Gender: Female Note Status: Finalized Procedure:            Upper GI endoscopy Indications:          Melena, Nausea with vomiting Providers:            Lucilla Lame MD, MD Referring MD:         Demetrios Loll (Referring MD) Medicines:            Propofol per Anesthesia Complications:        No immediate complications. Procedure:            Pre-Anesthesia Assessment:                       - Prior to the procedure, a History and Physical was                        performed, and patient medications and allergies were                        reviewed. The patient's tolerance of previous                        anesthesia was also reviewed. The risks and benefits of                        the procedure and the sedation options and risks were                        discussed with the patient. All questions were                        answered, and informed consent was obtained. Prior                        Anticoagulants: The patient has taken no previous                        anticoagulant or antiplatelet agents. ASA Grade                        Assessment: IV - A patient with severe systemic disease                        that is a constant threat to life. After reviewing the                        risks and benefits, the patient was deemed in                        satisfactory condition to undergo the procedure.                       After obtaining informed consent, the endoscope was                        passed under direct vision. Throughout the procedure,  the patient's blood pressure, pulse, and oxygen                        saturations were monitored continuously. The Endoscope                        was introduced through the mouth,  and advanced to the                        second part of duodenum. The upper GI endoscopy was                        accomplished without difficulty. The patient tolerated                        the procedure well. Findings:      The examined esophagus was normal.      One non-bleeding superficial gastric ulcer with no stigmata of bleeding       was found in the gastric antrum. The lesion was 4 mm in largest       dimension.      The examined duodenum was normal. Impression:           - Normal esophagus.                       - Healing Non-bleeding gastric ulcer with no stigmata                        of bleeding.                       - Normal examined duodenum.                       - No specimens collected. Recommendation:       - Return patient to hospital ward for ongoing care.                       - Resume regular diet.                       - Continue present medications.                       - Use a proton pump inhibitor PO daily. Procedure Code(s):    --- Professional ---                       [phone], Esophagogastroduodenoscopy, flexible, transoral;                        diagnostic, including collection of specimen(s) by                        brushing or washing, when performed (separate procedure) Diagnosis Code(s):    --- Professional ---                       R11.2, Nausea with vomiting, unspecified                       K92.1, Melena (includes Hematochezia)  K25.9, Gastric ulcer, unspecified as acute or chronic,                        without hemorrhage or perforation CPT copyright 2017 American Medical Association. All rights reserved. The codes documented in this report are preliminary and upon coder review may  be revised to meet current compliance requirements. Lucilla Lame MD, MD 05/25/2018 12:16:05 PM This report has been signed electronically. Number of Addenda: 0 Note Initiated On: 05/25/2018 11:35 AM      [facility]

## 2018-05-25 NOTE — OR Nursing (Signed)
Report called to serenity on floor. Pt transported back to floor

## 2018-05-25 NOTE — Anesthesia Postprocedure Evaluation (Signed)
Anesthesia Post Note  Patient: Chelsea Ramsey  Procedure(s) Performed: ESOPHAGOGASTRODUODENOSCOPY (EGD) WITH PROPOFOL (N/A )  Patient location during evaluation: Endoscopy Anesthesia Type: General Level of consciousness: awake and alert Pain management: pain level controlled Vital Signs Assessment: post-procedure vital signs reviewed and stable Respiratory status: spontaneous breathing, nonlabored ventilation, respiratory function stable and patient connected to nasal cannula oxygen Cardiovascular status: blood pressure returned to baseline and stable Postop Assessment: no apparent nausea or vomiting Anesthetic complications: no     Last Vitals:  Vitals:   05/25/18 1229 05/25/18 1308  BP: (!) 130/95 122/88  Pulse:  94  Resp:  18  Temp:  36.7 C  SpO2:  100%    Last Pain:  Vitals:   05/25/18 1341  TempSrc:   PainSc: 8                  Martha Clan

## 2018-05-27 ENCOUNTER — Encounter: Payer: Self-pay | Admitting: Gastroenterology

## 2018-05-27 DIAGNOSIS — R0602 Shortness of breath: Secondary | ICD-10-CM | POA: Insufficient documentation

## 2018-05-31 ENCOUNTER — Ambulatory Visit: Payer: Medicaid Other | Admitting: Gastroenterology

## 2018-05-31 LAB — HM COLONOSCOPY

## 2018-06-20 DIAGNOSIS — Z952 Presence of prosthetic heart valve: Secondary | ICD-10-CM | POA: Insufficient documentation

## 2018-07-17 ENCOUNTER — Emergency Department: Payer: Medicaid Other

## 2018-07-17 ENCOUNTER — Inpatient Hospital Stay
Admission: EM | Admit: 2018-07-17 | Discharge: 2018-07-21 | DRG: 292 | Disposition: A | Discharge status: Home / Self Care | Payer: Medicaid Other | Attending: Internal Medicine | Admitting: Internal Medicine

## 2018-07-17 ENCOUNTER — Other Ambulatory Visit: Payer: Self-pay

## 2018-07-17 ENCOUNTER — Encounter: Payer: Self-pay | Admitting: *Deleted

## 2018-07-17 DIAGNOSIS — I110 Hypertensive heart disease with heart failure: Secondary | ICD-10-CM | POA: Diagnosis not present

## 2018-07-17 DIAGNOSIS — Z886 Allergy status to analgesic agent status: Secondary | ICD-10-CM | POA: Diagnosis not present

## 2018-07-17 DIAGNOSIS — N390 Urinary tract infection, site not specified: Secondary | ICD-10-CM | POA: Diagnosis present

## 2018-07-17 DIAGNOSIS — I5033 Acute on chronic diastolic (congestive) heart failure: Secondary | ICD-10-CM | POA: Diagnosis present

## 2018-07-17 DIAGNOSIS — Z79899 Other long term (current) drug therapy: Secondary | ICD-10-CM | POA: Diagnosis not present

## 2018-07-17 DIAGNOSIS — Z888 Allergy status to other drugs, medicaments and biological substances status: Secondary | ICD-10-CM

## 2018-07-17 DIAGNOSIS — Z87891 Personal history of nicotine dependence: Secondary | ICD-10-CM | POA: Diagnosis not present

## 2018-07-17 DIAGNOSIS — J9610 Chronic respiratory failure, unspecified whether with hypoxia or hypercapnia: Secondary | ICD-10-CM | POA: Diagnosis present

## 2018-07-17 DIAGNOSIS — R778 Other specified abnormalities of plasma proteins: Secondary | ICD-10-CM

## 2018-07-17 DIAGNOSIS — R0789 Other chest pain: Secondary | ICD-10-CM | POA: Diagnosis present

## 2018-07-17 DIAGNOSIS — Z9981 Dependence on supplemental oxygen: Secondary | ICD-10-CM

## 2018-07-17 DIAGNOSIS — Z7901 Long term (current) use of anticoagulants: Secondary | ICD-10-CM

## 2018-07-17 DIAGNOSIS — Z885 Allergy status to narcotic agent status: Secondary | ICD-10-CM | POA: Diagnosis not present

## 2018-07-17 DIAGNOSIS — I482 Chronic atrial fibrillation: Secondary | ICD-10-CM | POA: Diagnosis present

## 2018-07-17 DIAGNOSIS — R7989 Other specified abnormal findings of blood chemistry: Secondary | ICD-10-CM

## 2018-07-17 DIAGNOSIS — J449 Chronic obstructive pulmonary disease, unspecified: Secondary | ICD-10-CM | POA: Diagnosis present

## 2018-07-17 DIAGNOSIS — Z96651 Presence of right artificial knee joint: Secondary | ICD-10-CM | POA: Diagnosis present

## 2018-07-17 DIAGNOSIS — R0609 Other forms of dyspnea: Secondary | ICD-10-CM

## 2018-07-17 DIAGNOSIS — I272 Pulmonary hypertension, unspecified: Secondary | ICD-10-CM | POA: Diagnosis present

## 2018-07-17 DIAGNOSIS — I2510 Atherosclerotic heart disease of native coronary artery without angina pectoris: Secondary | ICD-10-CM | POA: Diagnosis present

## 2018-07-17 DIAGNOSIS — E876 Hypokalemia: Secondary | ICD-10-CM | POA: Diagnosis present

## 2018-07-17 DIAGNOSIS — K219 Gastro-esophageal reflux disease without esophagitis: Secondary | ICD-10-CM | POA: Diagnosis present

## 2018-07-17 DIAGNOSIS — R079 Chest pain, unspecified: Secondary | ICD-10-CM

## 2018-07-17 DIAGNOSIS — F419 Anxiety disorder, unspecified: Secondary | ICD-10-CM | POA: Diagnosis present

## 2018-07-17 DIAGNOSIS — Z952 Presence of prosthetic heart valve: Secondary | ICD-10-CM

## 2018-07-17 DIAGNOSIS — R109 Unspecified abdominal pain: Secondary | ICD-10-CM

## 2018-07-17 DIAGNOSIS — I509 Heart failure, unspecified: Secondary | ICD-10-CM

## 2018-07-17 DIAGNOSIS — I5043 Acute on chronic combined systolic (congestive) and diastolic (congestive) heart failure: Secondary | ICD-10-CM

## 2018-07-17 LAB — CBC
HCT: 28.7 % — ABNORMAL LOW (ref 35.0–47.0)
Hemoglobin: 9.1 g/dL — ABNORMAL LOW (ref 12.0–16.0)
MCH: 24.3 pg — ABNORMAL LOW (ref 26.0–34.0)
MCHC: 31.6 g/dL — AB (ref 32.0–36.0)
MCV: 76.9 fL — AB (ref 80.0–100.0)
PLATELETS: 179 10*3/uL (ref 150–440)
RBC: 3.73 MIL/uL — ABNORMAL LOW (ref 3.80–5.20)
RDW: 21.2 % — AB (ref 11.5–14.5)
WBC: 6 10*3/uL (ref 3.6–11.0)

## 2018-07-17 LAB — URINALYSIS, COMPLETE (UACMP) WITH MICROSCOPIC
Bacteria, UA: NONE SEEN
Bilirubin Urine: NEGATIVE
Glucose, UA: NEGATIVE mg/dL
HGB URINE DIPSTICK: NEGATIVE
Ketones, ur: NEGATIVE mg/dL
Nitrite: NEGATIVE
PROTEIN: NEGATIVE mg/dL
SPECIFIC GRAVITY, URINE: 1.019 (ref 1.005–1.030)
pH: 7 (ref 5.0–8.0)

## 2018-07-17 LAB — BASIC METABOLIC PANEL
Anion gap: 10 (ref 5–15)
BUN: 9 mg/dL (ref 6–20)
CO2: 32 mmol/L (ref 22–32)
CREATININE: 0.65 mg/dL (ref 0.44–1.00)
Calcium: 8.8 mg/dL — ABNORMAL LOW (ref 8.9–10.3)
Chloride: 97 mmol/L — ABNORMAL LOW (ref 98–111)
GFR calc Af Amer: >60 mL/min (ref >60)
GFR calc non Af Amer: >60 mL/min (ref >60)
GLUCOSE: 97 mg/dL (ref 70–99)
Potassium: 3.6 mmol/L (ref 3.5–5.1)
Sodium: 139 mmol/L (ref 135–145)

## 2018-07-17 LAB — BRAIN NATRIURETIC PEPTIDE: B Natriuretic Peptide: 371 pg/mL — ABNORMAL HIGH (ref 0.0–100.0)

## 2018-07-17 LAB — TROPONIN I
TROPONIN I: 0.03 ng/mL — AB (ref <0.03)
Troponin I: 0.03 ng/mL (ref <0.03)

## 2018-07-17 MED ORDER — CEFTRIAXONE SODIUM 1 G IJ SOLR
1.0000 g | INTRAMUSCULAR | Status: DC
  Administered 2018-07-17: 1 g via INTRAVENOUS
  Filled 2018-07-17 (×2): qty 10

## 2018-07-17 MED ORDER — PROMETHAZINE HCL 12.5 MG PO TABS: 12.5000 mg | ORAL_TABLET | Freq: Four times a day (QID) | ORAL | 0 refills | Status: DC | PRN

## 2018-07-17 MED ORDER — HYDROCOD POLST-CPM POLST ER 10-8 MG/5ML PO SUER: 5.0000 mL | Freq: Three times a day (TID) | ORAL | Status: DC | PRN

## 2018-07-17 MED ORDER — RIVAROXABAN 20 MG PO TABS: 20.0000 mg | ORAL_TABLET | Freq: Every day | ORAL | Status: DC

## 2018-07-17 MED ORDER — BISACODYL 5 MG PO TBEC: 10.0000 mg | DELAYED_RELEASE_TABLET | Freq: Every day | ORAL | Status: DC | PRN

## 2018-07-17 MED ORDER — IOHEXOL 300 MG/ML  SOLN
75.0000 mL | Freq: Once | INTRAMUSCULAR | Status: AC | PRN
  Administered 2018-07-17: 75 mL via INTRAVENOUS

## 2018-07-17 MED ORDER — MORPHINE SULFATE (PF) 4 MG/ML IV SOLN
4.0000 mg | INTRAVENOUS | Status: DC | PRN
  Administered 2018-07-17 (×2): 4 mg via INTRAVENOUS
  Filled 2018-07-17 (×2): qty 1

## 2018-07-17 MED ORDER — ZOLPIDEM TARTRATE 5 MG PO TABS
5.0000 mg | ORAL_TABLET | Freq: Every day | ORAL | Status: DC | PRN
  Administered 2018-07-18 – 2018-07-20 (×3): 5 mg via ORAL
  Filled 2018-07-17 (×4): qty 1

## 2018-07-17 MED ORDER — FUROSEMIDE 10 MG/ML IJ SOLN
20.0000 mg | Freq: Two times a day (BID) | INTRAMUSCULAR | Status: DC
  Administered 2018-07-18: 20 mg via INTRAVENOUS
  Filled 2018-07-17: qty 2

## 2018-07-17 MED ORDER — DILTIAZEM HCL ER COATED BEADS 120 MG PO CP24
240.0000 mg | ORAL_CAPSULE | Freq: Every day | ORAL | Status: DC
  Administered 2018-07-20 – 2018-07-21 (×2): 240 mg via ORAL
  Filled 2018-07-17 (×2): qty 1
  Filled 2018-07-17: qty 2
  Filled 2018-07-17: qty 1
  Filled 2018-07-17: qty 2

## 2018-07-17 MED ORDER — PROMETHAZINE HCL 25 MG/ML IJ SOLN
12.5000 mg | Freq: Four times a day (QID) | INTRAMUSCULAR | Status: DC | PRN
  Administered 2018-07-17 – 2018-07-21 (×12): 12.5 mg via INTRAVENOUS
  Filled 2018-07-17 (×12): qty 1

## 2018-07-17 MED ORDER — METOPROLOL SUCCINATE ER 50 MG PO TB24
50.0000 mg | ORAL_TABLET | Freq: Three times a day (TID) | ORAL | Status: DC
  Administered 2018-07-18 – 2018-07-21 (×7): 50 mg via ORAL
  Filled 2018-07-17 (×8): qty 1

## 2018-07-17 MED ORDER — MOMETASONE FURO-FORMOTEROL FUM 200-5 MCG/ACT IN AERO
2.0000 | INHALATION_SPRAY | Freq: Two times a day (BID) | RESPIRATORY_TRACT | Status: DC
  Administered 2018-07-18 – 2018-07-21 (×8): 2 via RESPIRATORY_TRACT
  Filled 2018-07-17: qty 8.8

## 2018-07-17 MED ORDER — FOSFOMYCIN TROMETHAMINE 3 G PO PACK
3.0000 g | PACK | Freq: Once | ORAL | Status: AC
  Administered 2018-07-17: 3 g via ORAL
  Filled 2018-07-17: qty 3

## 2018-07-17 MED ORDER — FERROUS SULFATE 325 (65 FE) MG PO TABS
325.0000 mg | ORAL_TABLET | Freq: Every day | ORAL | Status: DC
  Administered 2018-07-18 – 2018-07-21 (×4): 325 mg via ORAL
  Filled 2018-07-17 (×4): qty 1

## 2018-07-17 MED ORDER — GABAPENTIN 300 MG PO CAPS
600.0000 mg | ORAL_CAPSULE | Freq: Three times a day (TID) | ORAL | Status: DC
  Administered 2018-07-18 – 2018-07-21 (×11): 600 mg via ORAL
  Filled 2018-07-17 (×11): qty 2

## 2018-07-17 MED ORDER — BUMETANIDE 1 MG PO TABS
4.0000 mg | ORAL_TABLET | Freq: Every day | ORAL | Status: DC
  Administered 2018-07-17 – 2018-07-21 (×5): 4 mg via ORAL
  Filled 2018-07-17 (×5): qty 4

## 2018-07-17 MED ORDER — IPRATROPIUM-ALBUTEROL 0.5-2.5 (3) MG/3ML IN SOLN
3.0000 mL | Freq: Three times a day (TID) | RESPIRATORY_TRACT | Status: DC | PRN
  Administered 2018-07-19 – 2018-07-20 (×2): 3 mL via RESPIRATORY_TRACT
  Filled 2018-07-17: qty 3

## 2018-07-17 NOTE — ED Triage Notes (Signed)
Patient arrived via POV, c/o shortness of breath, left-side chest pain, nausea and dizziness. Patient states she had a mitral valve replacement at [facility] 06/20/2018 and was just discharged 5 days ago. Patient states she had hyperkalemia and anemia that prolonged her stay. Patient c/o poor PO intake and nausea for the last 2-3 days.

## 2018-07-17 NOTE — ED Provider Notes (Addendum)
-----------------------------------------   5:29 PM on 07/17/2018 -----------------------------------------  Assumed care from Dr. Quentin Cornwall for reassessment after repeat troponin.  Repeat troponin was unchanged which is reassuring.  The patient reports persistent nausea but no chest pain.  She is diuresing a lot.  No dysuria, no obvious urinary tract infection although she does have some leukocytes.  In the absence of dysuria and an equivocal urinary tract infection, I ordered a single dose of fosfomycin 3 g by mouth to treat a possible urinary tract infection and I have sent a urine culture but I do not think she would benefit from an outpatient prescription.  I have updated her about the results and she is comfortable with the plan for discharge and outpatient follow-up.   Hinda Kehr, MD 07/17/18 1731     ----------------------------------------- 7:09 PM on 07/17/2018 -----------------------------------------  (Delayed documentation)  When it was time to discharge the patient, she became upset with the nurse and told her she was not comfortable going home because of the degree of severity of shortness of breath she gets with any amount of ambulation.  Her nurse help to the bathroom and said that she never became hypoxemic on her 3 L of oxygen but she was quite short of breath (dyspnea on exertion).  She requested to talk to me.  I went back in and discussed again with the patient.  She is very adamant that her shortness of breath is getting much worse and she cannot take care of herself at home.  She does have an elevated BNP with some evidence of interstitial edema on her chest CT.  She has received Bumex for additional diuresis in the emergency department and in spite of the diuresis she is still very short of breath with exertion.  I discussed the case with Dr. Margaretmary Eddy of the hospitalist service who will admit.   Hinda Kehr, MD 07/17/18 1910

## 2018-07-17 NOTE — Discharge Instructions (Signed)
Please increase your Bumex to 4 mg twice daily for the next 2 days.  Call the cardiology clinic for an appointment as soon as possible.  Return for worsening symptoms or for any additional questions or concerns.

## 2018-07-17 NOTE — ED Notes (Signed)
Date and time results received: 07/17/18 1:17 PM  Test: Troponin Critical Value: 0.03 ng/mL  Name of Provider Notified: Dr. Quentin Cornwall

## 2018-07-17 NOTE — H&P (Signed)
[facility] [place] NAME: Chelsea Ramsey    MR#:  [id number]  DATE OF BIRTH:  May 08, 1966  DATE OF ADMISSION:  07/17/2018  PRIMARY CARE PHYSICIAN: Dagmar Hait, MD   REQUESTING/REFERRING PHYSICIAN:   CHIEF COMPLAINT:  Chest pain and dizzy spells  HISTORY OF PRESENT ILLNESS:  Chelsea Ramsey  is a 52 y.o. female with a known history of COPD, anxiety, seasonal allergies recent history of mitral valve replacement on July 22 at [facility] is coming to the ED with a chief complaint of dizzy spells with associated with chest pain and nausea for the past 2 to 3 days.  Patient also reports that she had significant weight gain of 3 pounds in the past 2 days and the lower extremities are swollen.  Initial troponin is negative.  Chest x-ray has revealed interstitial edema.  Hospitalist team is called to admit the patient  PAST MEDICAL HISTORY:   Past Medical History:  Diagnosis Date  . Allergy    seasonal  . Anxiety   . Arthritis    Right Knee  . Asthma   . CHF (congestive heart failure) ([place])   . COPD (chronic obstructive pulmonary disease) ([place])   . Coronary artery disease    Leaky heart valve  . Fibromyalgia   . GERD (gastroesophageal reflux disease)   . Hypertension   . Pneumonia   . PUD (peptic ulcer disease)   . Pulmonary HTN ([place])   . Rheumatic fever/heart disease   . Sleep apnea     PAST SURGICAL HISTOIRY:   Past Surgical History:  Procedure Laterality Date  . ADENOIDECTOMY    . ESOPHAGOGASTRODUODENOSCOPY (EGD) WITH PROPOFOL N/A 05/25/2018   Procedure: ESOPHAGOGASTRODUODENOSCOPY (EGD) WITH PROPOFOL;  Surgeon: Lucilla Lame, MD;  Location[facility] ENDOSCOPY;  Service: Endoscopy;  Laterality: N/A;  . MITRAL VALVE REPLACEMENT    . MULTIPLE TOOTH EXTRACTIONS    . TONSILLECTOMY    . TOTAL KNEE ARTHROPLASTY Right 09/04/2016   Procedure: TOTAL KNEE ARTHROPLASTY; with lateral release;  Surgeon: Earlie Server, MD;  Location:  [place];  Service: Orthopedics;  Laterality: Right;    SOCIAL HISTORY:   Social History   Tobacco Use  . Smoking status: Former Smoker    Packs/day: 0.50    Types: Cigarettes  . Smokeless tobacco: Never Used  Substance Use Topics  . Alcohol use: Not Currently    Alcohol/week: 3.0 standard drinks    Types: 3 Cans of beer per week    Comment: 16 oz per week    FAMILY HISTORY:   Family History  Problem Relation Age of Onset  . COPD Mother   . Cancer Mother        Bone  . Asthma Mother   . Congestive Heart Failure Father     DRUG ALLERGIES:   Allergies  Allergen Reactions  . Amiodarone Nausea And Vomiting  . Aspirin Swelling  . Flexeril [Cyclobenzaprine] Swelling  . Morphine And Related Swelling  . Trazamine [Trazodone & Diet Manage Prod] Nausea And Vomiting  . Codeine Rash  . Tramadol Rash    REVIEW OF SYSTEMS:  CONSTITUTIONAL: No fever, fatigue or weakness.  EYES: No blurred or double vision.  EARS, NOSE, AND THROAT: No tinnitus or ear pain.  RESPIRATORY: No cough, shortness of breath, wheezing or hemoptysis.  CARDIOVASCULAR: No chest pain, orthopnea, edema.  GASTROINTESTINAL: Reports nausea, denies vomiting, diarrhea or abdominal pain.  GENITOURINARY: No dysuria, hematuria.  ENDOCRINE: No polyuria, nocturia,  HEMATOLOGY: No anemia, easy bruising or bleeding SKIN: No rash or lesion. MUSCULOSKELETAL: No joint pain or arthritis.   NEUROLOGIC: No tingling, numbness, weakness.  Reporting dizzy spells PSYCHIATRY: No anxiety or depression.   MEDICATIONS AT HOME:   Prior to Admission medications   Medication Sig Start Date End Date Taking? Authorizing Provider  bisacodyl (DULCOLAX) 5 MG EC tablet Take 2 tablets (10 mg total) by mouth daily as needed for moderate constipation. 05/25/18  Yes Demetrios Loll, MD  budesonide-formoterol [facility]) 160-4.5 MCG/ACT inhaler Inhale 2 puffs into the lungs 2 (two) times daily. 02/26/16  Yes [provider]  bumetanide  (BUMEX) 1 MG tablet Take 3 mg by mouth 2 (two) times daily. 04/19/18  Yes [provider]  diltiazem (CARDIZEM CD) 240 MG 24 hr capsule Take 1 capsule (240 mg total) by mouth daily. 04/26/18  Yes Vaughan Basta, MD  docusate sodium (COLACE) 100 MG capsule Take 100 mg by mouth 2 (two) times daily as needed. 07/11/18 07/18/18 Yes [provider]  ferrous sulfate 325 (65 FE) MG tablet Take 325 mg by mouth daily. 06/01/18  Yes [provider]  gabapentin (NEURONTIN) 300 MG capsule Take 600 mg by mouth 3 (three) times daily.  07/22/15  Yes [provider]  ipratropium-albuterol (DUONEB) 0.5-2.5 (3) MG/3ML SOLN Inhale 3 mLs into the lungs every 8 (eight) hours as needed (respiratory).  02/26/16 12/20/18 Yes [provider]  magnesium oxide (MAG-OX) 400 MG tablet Take 400 mg by mouth 2 (two) times daily. 07/11/18 07/11/19 Yes [provider]  metoprolol succinate (TOPROL-XL) 50 MG 24 hr tablet Take 50 mg by mouth 3 (three) times daily. 07/12/18  Yes [provider]  omeprazole (PRILOSEC) 40 MG capsule Take 40 mg by mouth 2 (two) times daily.   Yes [provider]  rivaroxaban (XARELTO) 20 MG TABS tablet Take 20 mg by mouth daily. 07/11/18 07/11/19 Yes [provider]  zolpidem (AMBIEN) 10 MG tablet Take 10 mg by mouth daily as needed for sleep.    Yes [provider]  butalbital-acetaminophen-caffeine (FIORICET, ESGIC) 50-325-40 MG tablet Take 1 tablet by mouth every 6 (six) hours as needed for headache. Patient not taking: Reported on 07/17/2018 04/25/18   Vaughan Basta, MD  carvedilol (COREG) 6.25 MG tablet Take 1 tablet (6.25 mg total) by mouth 2 (two) times daily with a meal. Patient not taking: Reported on 07/17/2018 04/25/18   Vaughan Basta, MD  chlorpheniramine-HYDROcodone (TUSSIONEX) 10-8 MG/5ML SUER Take 5 mLs by mouth 3 (three) times daily as needed for cough. Patient not taking: Reported on 07/17/2018  05/25/18   Demetrios Loll, MD  colchicine 0.6 MG tablet Take 1 tablet (0.6 mg total) by mouth daily. Patient not taking: Reported on 05/22/2018 02/24/18 02/24/19  Caryn Section Linden Dolin, PA-C  pantoprazole (PROTONIX) 40 MG tablet Take 1 tablet (40 mg total) by mouth 2 (two) times daily before a meal. Patient not taking: Reported on 07/17/2018 05/25/18   Demetrios Loll, MD  promethazine (PHENERGAN) 12.5 MG tablet Take 1 tablet (12.5 mg total) by mouth every 6 (six) hours as needed for nausea or vomiting. 07/17/18   Merlyn Lot, MD      VITAL SIGNS:  Blood pressure 127/84, pulse 88, temperature 98.3 F (36.8 C), resp. rate (!) 31, height 5\' 4"  (1.626 m), weight 130.2 kg, last menstrual period 11/08/2009, SpO2 97 %.  PHYSICAL EXAMINATION:  GENERAL:  52 y.o.-year-old patient lying in the bed with no acute distress.  EYES: Pupils equal, round, reactive  to light and accommodation. No scleral icterus. Extraocular muscles intact.  HEENT: Head atraumatic, normocephalic. Oropharynx and nasopharynx clear.  NECK:  Supple, no jugular venous distention. No thyroid enlargement, no tenderness.  LUNGS: Normal breath sounds bilaterally, no wheezing, positive rales,rhonchi or crepitation. No use of accessory muscles of respiration.  CARDIOVASCULAR: S1, S2 normal. No murmurs, rubs, or gallops.  ABDOMEN: Soft, nontender, nondistended. Bowel sounds present.  EXTREMITIES: 2+ pitting edema of bilateral lower extremities no cyanosis no clubbing  NEUROLOGIC: Cranial nerves II through XII are intact. Sensation intact. Gait not checked.  PSYCHIATRIC: The patient is alert and oriented x 3.  SKIN: No obvious rash, lesion, or ulcer.   LABORATORY PANEL:   CBC Recent Labs  Lab 07/17/18 1245  WBC 6.0  HGB 9.1*  HCT 28.7*  PLT 179   ------------------------------------------------------------------------------------------------------------------  Chemistries  Recent Labs  Lab 07/17/18 1245  NA 139  K 3.6  CL 97*  CO2 32   GLUCOSE 97  BUN 9  CREATININE 0.65  CALCIUM 8.8*   ------------------------------------------------------------------------------------------------------------------  Cardiac Enzymes Recent Labs  Lab 07/17/18 1558  TROPONINI 0.03*   ------------------------------------------------------------------------------------------------------------------  RADIOLOGY:  Dg Chest 1 View  Result Date: 07/17/2018 CLINICAL DATA:  Chest pain shortness of breath since yesterday. EXAM: CHEST  1 VIEW COMPARISON:  05/25/2018 FINDINGS: Sternotomy wires are present. Prosthetic mitral valve is present. Lungs are adequately inflated demonstrate hazy bilateral perihilar opacification suggesting mild interstitial edema. No lobar consolidation or effusion. Moderate stable cardiomegaly. Remainder of the exam is unchanged. IMPRESSION: Moderate cardiomegaly with findings suggesting mild interstitial edema. Electronically Signed   By: Marin Olp M.D.   On: 07/17/2018 13:48   Ct Chest W Contrast  Result Date: 07/17/2018 CLINICAL DATA:  52 year old female with acute shortness of breath, fever and chest pain. History of mitral valve replacement on 06/20/2018. EXAM: CT CHEST WITH CONTRAST TECHNIQUE: Multidetector CT imaging of the chest was performed during intravenous contrast administration. CONTRAST:  68mL OMNIPAQUE IOHEXOL 300 MG/ML  SOLN COMPARISON:  12/20/2017 CT.  07/17/2018 and prior radiographs FINDINGS: Cardiovascular: Cardiomegaly again noted. Mitral valve replacement and clip in the LEFT atrial region now noted. Median sternotomy identified with adjacent stranding, likely postoperative. No discrete abscess or soft tissue gas noted. No pericardial effusion. Aortic atherosclerotic calcifications noted without aneurysm. Mediastinum/Nodes: No enlarged mediastinal, hilar, or axillary lymph nodes. Thyroid gland, trachea, and esophagus demonstrate no significant findings. Lungs/Pleura: Mild interstitial opacities are  identified which may represent mild interstitial edema. No airspace disease, consolidation, mass or pneumothorax. Trace bilateral pleural effusions are present. Minimal bibasilar atelectasis noted. Upper Abdomen: No acute abnormality Musculoskeletal: No acute or suspicious bony abnormalities. IMPRESSION: 1. Interval mitral valve replacement and LEFT atrial clip since 12/20/2017 CT. Mild interstitial opacities likely representing mild interstitial edema. Trace bilateral pleural effusions 2. Median sternotomy and expected postoperative changes. 3. Cardiomegaly 4.  Aortic Atherosclerosis (ICD10-I70.0). Electronically Signed   By: Margarette Canada M.D.   On: 07/17/2018 15:00    EKG:   Orders placed or performed during the hospital encounter of 07/17/18  . EKG 12-Lead  . EKG 12-Lead  . ED EKG within 10 minutes  . ED EKG within 10 minutes    IMPRESSION AND PLAN:   Chelsea Ramsey  is a 52 y.o. female with a known history of COPD, anxiety, seasonal allergies recent history of mitral valve replacement on July 22 at [facility] is coming to the ED with a chief complaint of dizzy spells with associated with chest pain and nausea for  the past 2 to 3 days.  Patient also reports that she had significant weight gain of 3 pounds in the past 2 days and the lower extremities are swollen.  #Acute diastolic CHF Admit to telemetry Bumex one-time dose was given in the ED We will give Lasix 20 IV every 12 hours Daily weight monitoring, intake and output Continue home medication beta-blocker metoprolol,  #Acute cystitis urine culture pending.  Continue IV Rocephin  #Chronic atrial fibrillation rate controlled Continue home medication Cardizem and Eliquis  #Recent history of mitral valve replacement F/u with [facility] cardiology as recommended  #Allergic rhinitis continue Claritin  #COPD no exacerbation continue nebulizer treatments   All the records are reviewed and case discussed with ED provider. Management  plans discussed with the patient, family and they are in agreement.  CODE STATUS: fc   TOTAL TIME TAKING CARE OF THIS PATIENT: 43  minutes.   Note: This dictation was prepared with Dragon dictation along with smaller phrase technology. Any transcriptional errors that result from this process are unintentional.  Nicholes Mango M.D on 07/17/2018 at 6:59 PM  Between 7am to 6pm - Pager - [phone]  After 6pm go to [email or web address] - password [email or web address] [facility]  Office  [phone]  CC: Primary care physician; Dagmar Hait, MD

## 2018-07-17 NOTE — ED Provider Notes (Signed)
[facility] Emergency Department Provider Note    First MD Initiated Contact with Patient 07/17/18 1301     (approximate)  I have reviewed the triage vital signs and the nursing notes.   HISTORY  Chief Complaint Chest Pain    HPI Chelsea Ramsey is a 52 y.o. female with extensive past medical history with recent mitral valve replacement [place] within the last month presents the ER for generalized malaise decreased p.o. intake and nausea for the past 2 to 3 days.  Denies any chest pressure or pain right now does have 3 pound weight gain over the past 2 days with worsening lower extremity swelling.  Her hospitalization was complicated by symptomatic anemia and hyperkalemia with AKI.  She was discharged just 5 days ago.  She is been on Bumex daily and is still making urine.    Past Medical History:  Diagnosis Date  . Allergy    seasonal  . Anxiety   . Arthritis    Right Knee  . Asthma   . CHF (congestive heart failure) ([place])   . COPD (chronic obstructive pulmonary disease) ([place])   . Coronary artery disease    Leaky heart valve  . Fibromyalgia   . GERD (gastroesophageal reflux disease)   . Hypertension   . Pneumonia   . PUD (peptic ulcer disease)   . Pulmonary HTN ([place])   . Rheumatic fever/heart disease   . Sleep apnea    Family History  Problem Relation Age of Onset  . COPD Mother   . Cancer Mother        Bone  . Asthma Mother   . Congestive Heart Failure Father    Past Surgical History:  Procedure Laterality Date  . ADENOIDECTOMY    . ESOPHAGOGASTRODUODENOSCOPY (EGD) WITH PROPOFOL N/A 05/25/2018   Procedure: ESOPHAGOGASTRODUODENOSCOPY (EGD) WITH PROPOFOL;  Surgeon: Lucilla Lame, MD;  Location: [facility] ENDOSCOPY;  Service: Endoscopy;  Laterality: N/A;  . MITRAL VALVE REPLACEMENT    . MULTIPLE TOOTH EXTRACTIONS    . TONSILLECTOMY    . TOTAL KNEE ARTHROPLASTY Right 09/04/2016   Procedure: TOTAL KNEE ARTHROPLASTY; with lateral release;   Surgeon: Earlie Server, MD;  Location: [place];  Service: Orthopedics;  Laterality: Right;   Patient Active Problem List   Diagnosis Date Noted  . Melena   . Nausea and vomiting   . Acute esophagogastric ulcer   . Intractable vomiting with nausea   . GIB (gastrointestinal bleeding) 05/22/2018  . A-fib ([place]) 04/22/2018  . COPD (chronic obstructive pulmonary disease) ([place]) 12/21/2017  . COPD (chronic obstructive pulmonary disease) with acute bronchitis ([place]) 12/20/2017  . Primary localized osteoarthritis of right knee 09/04/2016      Prior to Admission medications   Medication Sig Start Date End Date Taking? Authorizing Provider  bisacodyl (DULCOLAX) 5 MG EC tablet Take 2 tablets (10 mg total) by mouth daily as needed for moderate constipation. 05/25/18  Yes Demetrios Loll, MD  budesonide-formoterol [facility]) 160-4.5 MCG/ACT inhaler Inhale 2 puffs into the lungs 2 (two) times daily. 02/26/16  Yes [provider]  bumetanide (BUMEX) 1 MG tablet Take 3 mg by mouth 2 (two) times daily. 04/19/18  Yes [provider]  diltiazem (CARDIZEM CD) 240 MG 24 hr capsule Take 1 capsule (240 mg total) by mouth daily. 04/26/18  Yes Vaughan Basta, MD  docusate sodium (COLACE) 100 MG capsule Take 100 mg by mouth 2 (two) times daily as needed. 07/11/18 07/18/18 Yes [provider]  ferrous  sulfate 325 (65 FE) MG tablet Take 325 mg by mouth daily. 06/01/18  Yes [provider]  gabapentin (NEURONTIN) 300 MG capsule Take 600 mg by mouth 3 (three) times daily.  07/22/15  Yes [provider]  ipratropium-albuterol (DUONEB) 0.5-2.5 (3) MG/3ML SOLN 3 mLs.  02/26/16 12/20/18 Yes [provider]  magnesium oxide (MAG-OX) 400 MG tablet Take 400 mg by mouth 2 (two) times daily. 07/11/18 07/11/19 Yes [provider]  metoprolol succinate (TOPROL-XL) 50 MG 24 hr tablet Take 50 mg by mouth 3 (three) times daily. 07/12/18  Yes [provider]  omeprazole  (PRILOSEC) 40 MG capsule Take 40 mg by mouth 2 (two) times daily.   Yes [provider]  rivaroxaban (XARELTO) 20 MG TABS tablet Take 20 mg by mouth daily. 07/11/18 07/11/19 Yes [provider]  zolpidem (AMBIEN) 10 MG tablet Take 10 mg by mouth daily as needed for sleep.    Yes [provider]  butalbital-acetaminophen-caffeine (FIORICET, ESGIC) 50-325-40 MG tablet Take 1 tablet by mouth every 6 (six) hours as needed for headache. Patient not taking: Reported on 07/17/2018 04/25/18   Vaughan Basta, MD  carvedilol (COREG) 6.25 MG tablet Take 1 tablet (6.25 mg total) by mouth 2 (two) times daily with a meal. Patient not taking: Reported on 07/17/2018 04/25/18   Vaughan Basta, MD  chlorpheniramine-HYDROcodone (TUSSIONEX) 10-8 MG/5ML SUER Take 5 mLs by mouth 3 (three) times daily as needed for cough. Patient not taking: Reported on 07/17/2018 05/25/18   Demetrios Loll, MD  colchicine 0.6 MG tablet Take 1 tablet (0.6 mg total) by mouth daily. Patient not taking: Reported on 05/22/2018 02/24/18 02/24/19  Caryn Section Linden Dolin, PA-C  pantoprazole (PROTONIX) 40 MG tablet Take 1 tablet (40 mg total) by mouth 2 (two) times daily before a meal. Patient not taking: Reported on 07/17/2018 05/25/18   Demetrios Loll, MD  promethazine (PHENERGAN) 12.5 MG tablet Take 1 tablet (12.5 mg total) by mouth every 6 (six) hours as needed for nausea or vomiting. 07/17/18   Merlyn Lot, MD    Allergies Amiodarone; Aspirin; Flexeril [cyclobenzaprine]; Morphine and related; Trazamine [trazodone & diet manage prod]; Codeine; and Tramadol    Social History Social History   Tobacco Use  . Smoking status: Former Smoker    Packs/day: 0.50    Types: Cigarettes  . Smokeless tobacco: Never Used  Substance Use Topics  . Alcohol use: Not Currently    Alcohol/week: 3.0 standard drinks    Types: 3 Cans of beer per week    Comment: 16 oz per week  . Drug use: Not Currently    Comment: Previous use  of cocaine and marijuana last use 07/31/16     Review of Systems Patient denies headaches, rhinorrhea, blurry vision, numbness, shortness of breath, chest pain, edema, cough, abdominal pain, nausea, vomiting, diarrhea, dysuria, fevers, rashes or hallucinations unless otherwise stated above in HPI. ____________________________________________   PHYSICAL EXAM:  VITAL SIGNS: Vitals:   07/17/18 1330 07/17/18 1411  BP: (!) 105/53 126/90  Pulse: 82 80  Resp: 13 19  Temp:    SpO2: 100% 100%    Constitutional: Alert and oriented.  Eyes: Conjunctivae are normal.  Head: Atraumatic. Nose: No congestion/rhinnorhea. Mouth/Throat: Mucous membranes are moist.   Neck: No stridor. Painless ROM.  Cardiovascular: Normal rate, regular rhythm. Grossly normal heart sounds.  Good peripheral circulation. Respiratory: Normal respiratory effort.  No retractions. Lungs with coarse bibasilar breathsounds Gastrointestinal: Soft and nontender. No distention. No abdominal bruits. No CVA  tenderness. Genitourinary: deferred Musculoskeletal: midline sternotomy incision appears c/d/i No lower extremity tenderness, 2+ BLE edema.  No joint effusions. Neurologic:  Normal speech and language. No gross focal neurologic deficits are appreciated. No facial droop Skin:  Skin is warm, dry and intact. No rash noted. Psychiatric: Mood and affect are normal. Speech and behavior are normal.  ____________________________________________   LABS (all labs ordered are listed, but only abnormal results are displayed)  Results for orders placed or performed during the hospital encounter of 07/17/18 (from the past 24 hour(s))  Basic metabolic panel     Status: Abnormal   Collection Time: 07/17/18 12:45 PM  Result Value Ref Range   Sodium 139 135 - 145 mmol/L   Potassium 3.6 3.5 - 5.1 mmol/L   Chloride 97 (L) 98 - 111 mmol/L   CO2 32 22 - 32 mmol/L   Glucose, Bld 97 70 - 99 mg/dL   BUN 9 6 - 20 mg/dL   Creatinine, Ser 0.65  0.44 - 1.00 mg/dL   Calcium 8.8 (L) 8.9 - 10.3 mg/dL   GFR calc non Af Amer >60 >60 mL/min   GFR calc Af Amer >60 >60 mL/min   Anion gap 10 5 - 15  CBC     Status: Abnormal   Collection Time: 07/17/18 12:45 PM  Result Value Ref Range   WBC 6.0 3.6 - 11.0 K/uL   RBC 3.73 (L) 3.80 - 5.20 MIL/uL   Hemoglobin 9.1 (L) 12.0 - 16.0 g/dL   HCT 28.7 (L) 35.0 - 47.0 %   MCV 76.9 (L) 80.0 - 100.0 fL   MCH 24.3 (L) 26.0 - 34.0 pg   MCHC 31.6 (L) 32.0 - 36.0 g/dL   RDW 21.2 (H) 11.5 - 14.5 %   Platelets 179 150 - 440 K/uL  Troponin I     Status: Abnormal   Collection Time: 07/17/18 12:45 PM  Result Value Ref Range   Troponin I 0.03 (HH) <0.03 ng/mL  Brain natriuretic peptide     Status: Abnormal   Collection Time: 07/17/18 12:45 PM  Result Value Ref Range   B Natriuretic Peptide 371.0 (H) 0.0 - 100.0 pg/mL   ____________________________________________  EKG My review and personal interpretation at Time: 12:40   Indication: chestp ain  Rate: 85  Rhythm: sinus Axis: normal Other: normal intervals, no stemi ____________________________________________  RADIOLOGY  I personally reviewed all radiographic images ordered to evaluate for the above acute complaints and reviewed radiology reports and findings.  These findings were personally discussed with the patient.  Please see medical record for radiology report.  ____________________________________________   PROCEDURES  Procedure(s) performed:  Procedures    Critical Care performed: no ____________________________________________   INITIAL IMPRESSION / ASSESSMENT AND PLAN / ED COURSE  Pertinent labs & imaging results that were available during my care of the patient were reviewed by me and considered in my medical decision making (see chart for details).   DDX: chf, copd, pna, ptx, acs,   Brynne C Walters is a 52 y.o. who presents to the ED with symptoms as described above.  Patient with a complex recent past medical history  but is otherwise afebrile hemodynamically stable here.  Blood work will be sent for the above differential.  EKG shows no signs of ischemia.  Will provide IV pain medication as well as IV antiemetic.  Clinical Course as of Jul 18 1527  Sun Jul 17, 2018  1405 Given patient's report of fever to 101 yesterday with midsternal chest  pain status post operation will order CT imaging as her renal function is normal to evaluate for retrosternal abscess.  Bedside ultrasound does not show evidence of pericardial effusion.   [PR]  1512 CT imaging is reassuring.  Patient is pain-free at this time.  We will touch with cardiology.  Induced suspect some component of mild congestive heart failure but no hypoxia or respiratory distress.  We will give her extra dose of Bumex.  Patient will be signed off to oncoming physician for repeat troponin.   [PR]    Clinical Course User Index [PR] Merlyn Lot, MD     As part of my medical decision making, I reviewed the following data within the [facility] notes reviewed and incorporated, Labs reviewed, notes from prior ED visits   ____________________________________________   FINAL CLINICAL IMPRESSION(S) / ED DIAGNOSES  Final diagnoses:  Chronic congestive heart failure, unspecified heart failure type (HCC)  Chest pain, unspecified type      NEW MEDICATIONS STARTED DURING THIS VISIT:  New Prescriptions   PROMETHAZINE (PHENERGAN) 12.5 MG TABLET    Take 1 tablet (12.5 mg total) by mouth every 6 (six) hours as needed for nausea or vomiting.     Note:  This document was prepared using Dragon voice recognition software and may include unintentional dictation errors.    Merlyn Lot, MD 07/17/18 [id number]

## 2018-07-17 NOTE — Progress Notes (Signed)
Family Meeting Note  Advance Directive:yes  Today a meeting took place with the Patient.    The following clinical team members were present during this meeting:MD  The following were discussed:Patient's diagnosis: Near syncope, chest pain, acute CHF, acute cystitis, recent history of mitral valve replacement at [facility] on July 22, treatment plan of care discussed in detail with the patient.  Other comorbidities as documented below are also discussed with the patient.   Allergy     seasonal  . Anxiety   . Arthritis    Right Knee  . Asthma   . CHF (congestive heart failure) ([place])   . COPD (chronic obstructive pulmonary disease) ([place])   . Coronary artery disease    Leaky heart valve  . Fibromyalgia   . GERD (gastroesophageal reflux disease)   . Hypertension   . Pneumonia   . PUD (peptic ulcer disease)   . Pulmonary HTN ([place])   . Rheumatic fever/heart disease   . Sleep apnea      , Patient's progosis: > 12 months and Goals for treatment: Full Code  Additional follow-up to be provided: Hospitalist, cardiology  Time spent during discussion:18 min  Nicholes Mango, MD

## 2018-07-17 NOTE — ED Notes (Signed)
Pt is on 3 lpm via [place] at all times.

## 2018-07-18 LAB — BASIC METABOLIC PANEL
Anion gap: 10 (ref 5–15)
BUN: 10 mg/dL (ref 6–20)
CHLORIDE: 96 mmol/L — AB (ref 98–111)
CO2: 34 mmol/L — ABNORMAL HIGH (ref 22–32)
Calcium: 8.2 mg/dL — ABNORMAL LOW (ref 8.9–10.3)
Creatinine, Ser: 0.66 mg/dL (ref 0.44–1.00)
GFR calc non Af Amer: >60 mL/min (ref >60)
Glucose, Bld: 101 mg/dL — ABNORMAL HIGH (ref 70–99)
Potassium: 3 mmol/L — ABNORMAL LOW (ref 3.5–5.1)
SODIUM: 140 mmol/L (ref 135–145)

## 2018-07-18 LAB — CBC
HEMATOCRIT: 25.9 % — AB (ref 35.0–47.0)
HEMOGLOBIN: 8.3 g/dL — AB (ref 12.0–16.0)
MCH: 24.7 pg — ABNORMAL LOW (ref 26.0–34.0)
MCHC: 32.1 g/dL (ref 32.0–36.0)
MCV: 76.9 fL — AB (ref 80.0–100.0)
Platelets: 178 10*3/uL (ref 150–440)
RBC: 3.37 MIL/uL — AB (ref 3.80–5.20)
RDW: 21.3 % — ABNORMAL HIGH (ref 11.5–14.5)
WBC: 5.8 10*3/uL (ref 3.6–11.0)

## 2018-07-18 MED ORDER — ACETAMINOPHEN 325 MG PO TABS
650.0000 mg | ORAL_TABLET | Freq: Four times a day (QID) | ORAL | Status: DC | PRN
Start: 2018-07-18 — End: 2018-07-21
  Filled 2018-07-18: qty 2

## 2018-07-18 MED ORDER — POTASSIUM CHLORIDE CRYS ER 20 MEQ PO TBCR
40.0000 meq | EXTENDED_RELEASE_TABLET | ORAL | Status: AC
  Administered 2018-07-18 (×2): 40 meq via ORAL
  Filled 2018-07-18 (×2): qty 2

## 2018-07-18 MED ORDER — SENNA 8.6 MG PO TABS
1.0000 | ORAL_TABLET | Freq: Every day | ORAL | Status: DC
  Administered 2018-07-18 – 2018-07-20 (×4): 8.6 mg via ORAL
  Filled 2018-07-18 (×4): qty 1

## 2018-07-18 MED ORDER — MAGNESIUM OXIDE 400 (241.3 MG) MG PO TABS
400.0000 mg | ORAL_TABLET | Freq: Two times a day (BID) | ORAL | Status: DC
  Administered 2018-07-18 – 2018-07-21 (×8): 400 mg via ORAL
  Filled 2018-07-18 (×8): qty 1

## 2018-07-18 MED ORDER — HYDROMORPHONE HCL 1 MG/ML IJ SOLN
1.0000 mg | INTRAMUSCULAR | Status: DC | PRN
  Administered 2018-07-18 – 2018-07-20 (×14): 1 mg via INTRAVENOUS
  Filled 2018-07-18 (×14): qty 1

## 2018-07-18 MED ORDER — RIVAROXABAN 20 MG PO TABS
20.0000 mg | ORAL_TABLET | Freq: Every day | ORAL | Status: DC
  Administered 2018-07-18 – 2018-07-20 (×4): 20 mg via ORAL
  Filled 2018-07-18 (×4): qty 1

## 2018-07-18 MED ORDER — PANTOPRAZOLE SODIUM 40 MG PO TBEC
40.0000 mg | DELAYED_RELEASE_TABLET | Freq: Two times a day (BID) | ORAL | Status: DC
  Administered 2018-07-18 – 2018-07-21 (×8): 40 mg via ORAL
  Filled 2018-07-18 (×8): qty 1

## 2018-07-18 NOTE — Care Management Note (Signed)
Case Management Note  Patient Details  Name: Chelsea Ramsey MRN: [id number] Date of Birth: Aug 10, 1966  Subjective/Objective:                  RNCM spoke with Chelsea Si Gaul RN regarding patient presentation. Per Chelsea Ramsey patient does not call home health agency- she "just goes back to hospital".  They have her on home telemonitor.  Please add home labs to monitor heart failure. RNCM met with patient to discuss transition of care. She was recently at [facility] for cardiac valve replacement and is open to [facility] care- she wants to remain with [place]. RNCM offered heart failure program through [facility] at [facility] with Re[facility] vest and patient declined.  She lives with her husband Chelsea Ramsey and is alone most of the day because he works.  She is on chronic home supplemental O2 through [facility] home care. She is on chronic Xarelto at $3 without problems. Her PCP is with [facility] ambulatory care center ?Dr. Raylene Miyamoto? She depends on family for transportation without problems. She uses [employer] for medications. She has a Hov-a-round, a wheelchair, and a walker available for use in the home. She denies any other needs.   Action/Plan:    Chelsea Ramsey will need resumption of care at discharge- please add social worker and PT.   Expected Discharge Date:                  Expected Discharge Plan:     In-House Referral:     Discharge planning Services  CM Consult  Post Acute Care Choice:  Home Health Choice offered to:  Patient  DME Arranged:    DME Agency:     HH Arranged:  RN [place] Agency:  [place]  Status of Service:  In process, will continue to follow  If discussed at Long Length of Stay Meetings, dates discussed:    Additional Comments:  Marshell Garfinkel, RN 07/18/2018, 4:46 PM

## 2018-07-18 NOTE — Progress Notes (Signed)
[place] [facility] [place] NAME: Chelsea Ramsey    MR#:  [id number]  DATE OF BIRTH:  [birth year]  SUBJECTIVE: Admitted for chest pain, shortness of breath, dizziness.  Found to have acute on chronic CHF.  Patient had history of valve repair on July 22 at [facility] that time she received 3 units of blood transfusion and some fluids.  Now has shortness of breath, low-grade temperature.  Admitted for CHF exacerbation.  Tells me that Lasix is not helping and she did not void much.  He usually takes Bumex 1 mg p.o. twice daily at home.  CHIEF COMPLAINT:   Chief Complaint  Patient presents with  . Chest Pain  Complains of chest pain due to recent surgery.  Chest scar is s not infected.Marland Kitchen  REVIEW OF SYSTEMS:   ROS CONSTITUTIONAL: No fever, fatigue or weakness.  EYES: No blurred or double vision.  EARS, NOSE, AND THROAT: No tinnitus or ear pain.  RESPIRATORY: Mild shortness of breath, postoperative chest discomfort. CARDIOVASCULAR: No chest pain, orthopnea, edema.  GASTROINTESTINAL: No nausea, vomiting, diarrhea or abdominal pain.  GENITOURINARY: No dysuria, hematuria.  ENDOCRINE: No polyuria, nocturia,  HEMATOLOGY: No anemia, easy bruising or bleeding SKIN: No rash or lesion. MUSCULOSKELETAL: No joint pain or arthritis.   NEUROLOGIC: No tingling, numbness, weakness.  PSYCHIATRY: No anxiety or depression.   DRUG ALLERGIES:   Allergies  Allergen Reactions  . Amiodarone Nausea And Vomiting  . Aspirin Swelling  . Flexeril [Cyclobenzaprine] Swelling  . Morphine And Related Swelling  . Trazamine [Trazodone & Diet Manage Prod] Nausea And Vomiting  . Codeine Rash  . Tramadol Rash    VITALS:  Blood pressure 107/60, pulse 79, temperature 98.2 F (36.8 C), temperature source Oral, resp. rate 18, height 5\' 4"  (1.626 m), weight 129.4 kg, last menstrual period 11/08/2009, SpO2 100 %.  PHYSICAL EXAMINATION:  GENERAL:  52 y.o.-year-old patient lying in  the bed with no acute distress.  EYES: Pupils equal, round, reactive to light and accommodation. No scleral icterus. Extraocular muscles intact.  HEENT: Head atraumatic, normocephalic. Oropharynx and nasopharynx clear.  NECK:  Supple, no jugular venous distention. No thyroid enlargement, no tenderness.  LUNGS: Normal breath sounds bilaterally, no wheezing, rales,rhonchi or crepitation. No use of accessory muscles of respiration.  CARDIOVASCULAR: S1, S2 normal. No murmurs, rubs, or gallops.  ABDOMEN: Soft, nontender, nondistended. Bowel sounds present. No organomegaly or mass.  EXTREMITIES: No pedal edema, cyanosis, or clubbing.  NEUROLOGIC: Cranial nerves II through XII are intact. Muscle strength 5/5 in all extremities. Sensation intact. Gait not checked.  PSYCHIATRIC: The patient is alert and oriented x 3.  SKIN: No obvious rash, lesion, or ulcer.    LABORATORY PANEL:   CBC Recent Labs  Lab 07/18/18 0558  WBC 5.8  HGB 8.3*  HCT 25.9*  PLT 178   ------------------------------------------------------------------------------------------------------------------  Chemistries  Recent Labs  Lab 07/18/18 0558  NA 140  K 3.0*  CL 96*  CO2 34*  GLUCOSE 101*  BUN 10  CREATININE 0.66  CALCIUM 8.2*   ------------------------------------------------------------------------------------------------------------------  Cardiac Enzymes Recent Labs  Lab 07/17/18 1558  TROPONINI 0.03*   ------------------------------------------------------------------------------------------------------------------  RADIOLOGY:  Dg Chest 1 View  Result Date: 07/17/2018 CLINICAL DATA:  Chest pain shortness of breath since yesterday. EXAM: CHEST  1 VIEW COMPARISON:  05/25/2018 FINDINGS: Sternotomy wires are present. Prosthetic mitral valve is present. Lungs are adequately inflated demonstrate hazy bilateral perihilar opacification suggesting mild interstitial edema. No lobar consolidation or effusion.  Moderate stable cardiomegaly. Remainder of the exam is unchanged. IMPRESSION: Moderate cardiomegaly with findings suggesting mild interstitial edema. Electronically Signed   By: Marin Olp M.D.   On: 07/17/2018 13:48   Ct Chest W Contrast  Result Date: 07/17/2018 CLINICAL DATA:  52 year old female with acute shortness of breath, fever and chest pain. History of mitral valve replacement on 06/20/2018. EXAM: CT CHEST WITH CONTRAST TECHNIQUE: Multidetector CT imaging of the chest was performed during intravenous contrast administration. CONTRAST:  21mL OMNIPAQUE IOHEXOL 300 MG/ML  SOLN COMPARISON:  12/20/2017 CT.  07/17/2018 and prior radiographs FINDINGS: Cardiovascular: Cardiomegaly again noted. Mitral valve replacement and clip in the LEFT atrial region now noted. Median sternotomy identified with adjacent stranding, likely postoperative. No discrete abscess or soft tissue gas noted. No pericardial effusion. Aortic atherosclerotic calcifications noted without aneurysm. Mediastinum/Nodes: No enlarged mediastinal, hilar, or axillary lymph nodes. Thyroid gland, trachea, and esophagus demonstrate no significant findings. Lungs/Pleura: Mild interstitial opacities are identified which may represent mild interstitial edema. No airspace disease, consolidation, mass or pneumothorax. Trace bilateral pleural effusions are present. Minimal bibasilar atelectasis noted. Upper Abdomen: No acute abnormality Musculoskeletal: No acute or suspicious bony abnormalities. IMPRESSION: 1. Interval mitral valve replacement and LEFT atrial clip since 12/20/2017 CT. Mild interstitial opacities likely representing mild interstitial edema. Trace bilateral pleural effusions 2. Median sternotomy and expected postoperative changes. 3. Cardiomegaly 4.  Aortic Atherosclerosis (ICD10-I70.0). Electronically Signed   By: Margarette Canada M.D.   On: 07/17/2018 15:00    EKG:   Orders placed or performed during the hospital encounter of 07/17/18   . EKG 12-Lead  . EKG 12-Lead  . ED EKG within 10 minutes  . ED EKG within 10 minutes    ASSESSMENT AND PLAN:   Acute on chronic diastolic heart failure, admitted to telemetry, continue Bumex and discontinue Lasix as it is not helping her.  Continue Bumex 2 mg p.o. twice daily, monitor urine output. 2.  Chronic atrial fibrillation, rate controlled, continue beta-blockers.,  Calcium channel blockers 3.  And mitral valve surgery, patient is on Xarelto. 4.  Postoperative chest discomfort, continue pain medicines, troponins have been negative.  Continue IV Dilaudid for pain control 5.  Hypokalemia, replace the potassium. 6 chronic respiratory failure, patient is on 2 L of oxygen chronically. 7 mild UTI, patient is on Ceftin.  Follow urine cultures.  Than 50% of time spent in counseling, coordination of care   All the records are reviewed and case discussed with Care Management/Social Workerr. Management plans discussed with the patient, family and they are in agreement.  CODE STATUS: Full code  TOTAL TIME TAKING CARE OF THIS PATIENT: 40 minutes.   POSSIBLE D/C IN 1-2DAYS, DEPENDING ON CLINICAL CONDITION.   Epifanio Lesches M.D on 07/18/2018 at 12:57 PM  Between 7am to 6pm - Pager - [phone]  After 6pm go to [email or web address] - password [email or web address] [facility]  Office  [phone]  CC: Primary care physician; Dagmar Hait, MD   Note: This dictation was prepared with Dragon dictation along with smaller phrase technology. Any transcriptional errors that result from this process are unintentional.

## 2018-07-18 NOTE — Consult Note (Signed)
Reason for Consult: Chest pain vertigo Referring Physician: Dr. Margaretmary Eddy hospitalist, [facility]  Chelsea Ramsey is an 52 y.o. female.  HPI: Patient presents status post COPD anxiety seasonal allergies status post valve replacement.  Patient has history of atrial fibrillation on anticoagulation patient has a leaky valve now presents for further assessment evaluation with recent vague chest pain on the right side different from her previous anginal symptoms.  Patient has had congestive heart failure with leg edema.  Patient has had reflux symptoms and hypertension feels reasonably well patient has had mitral valve replacement done at [facility].  Patient is a former smoker but feels reasonably well now  Past Medical History:  Diagnosis Date  . Allergy    seasonal  . Anxiety   . Arthritis    Right Knee  . Asthma   . CHF (congestive heart failure) ([place])   . COPD (chronic obstructive pulmonary disease) ([place])   . Coronary artery disease    Leaky heart valve  . Fibromyalgia   . GERD (gastroesophageal reflux disease)   . Hypertension   . Pneumonia   . PUD (peptic ulcer disease)   . Pulmonary HTN ([place])   . Rheumatic fever/heart disease   . Sleep apnea     Past Surgical History:  Procedure Laterality Date  . ADENOIDECTOMY    . ESOPHAGOGASTRODUODENOSCOPY (EGD) WITH PROPOFOL N/A 05/25/2018   Procedure: ESOPHAGOGASTRODUODENOSCOPY (EGD) WITH PROPOFOL;  Surgeon: Lucilla Lame, MD;  Location: [facility];  Service: Endoscopy;  Laterality: N/A;  . MITRAL VALVE REPLACEMENT    . MULTIPLE TOOTH EXTRACTIONS    . TONSILLECTOMY    . TOTAL KNEE ARTHROPLASTY Right 09/04/2016   Procedure: TOTAL KNEE ARTHROPLASTY; with lateral release;  Surgeon: Earlie Server, MD;  Location: [facility];  Service: Orthopedics;  Laterality: Right;    Family History  Problem Relation Age of Onset  . COPD Mother   . Cancer Mother        Bone  . Asthma Mother   . Congestive Heart Failure Father     Social History:   reports that she has quit smoking. Her smoking use included cigarettes. She smoked 0.50 packs per day. She has never used smokeless tobacco. She reports that she drank about 3.0 standard drinks of alcohol per week. She reports that she has current or past drug history.  Allergies:  Allergies  Allergen Reactions  . Amiodarone Nausea And Vomiting  . Aspirin Swelling  . Flexeril [Cyclobenzaprine] Swelling  . Morphine And Related Swelling  . Trazamine [Trazodone & Diet Manage Prod] Nausea And Vomiting  . Codeine Rash  . Tramadol Rash    Medications: I have reviewed the patient's current medications.  Results for orders placed or performed during the hospital encounter of 07/17/18 (from the past 48 hour(s))  Basic metabolic panel     Status: Abnormal   Collection Time: 07/17/18 12:45 PM  Result Value Ref Range   Sodium 139 135 - 145 mmol/L   Potassium 3.6 3.5 - 5.1 mmol/L   Chloride 97 (L) 98 - 111 mmol/L   CO2 32 22 - 32 mmol/L   Glucose, Bld 97 70 - 99 mg/dL   BUN 9 6 - 20 mg/dL   Creatinine, Ser 0.65 0.44 - 1.00 mg/dL   Calcium 8.8 (L) 8.9 - 10.3 mg/dL   GFR calc non Af Amer >60 >60 mL/min   GFR calc Af Amer >60 >60 mL/min    Comment: (NOTE) The eGFR has been calculated using the CKD  EPI equation. This calculation has not been validated in all clinical situations. eGFR's persistently <60 mL/min signify possible Chronic Kidney Disease.    Anion gap 10 5 - 15    Comment: Performed at [facility] [address] [place], [place] [address]  CBC     Status: Abnormal   Collection Time: 07/17/18 12:45 PM  Result Value Ref Range   WBC 6.0 3.6 - 11.0 K/uL   RBC 3.73 (L) 3.80 - 5.20 MIL/uL   Hemoglobin 9.1 (L) 12.0 - 16.0 g/dL   HCT 28.7 (L) 35.0 - 47.0 %   MCV 76.9 (L) 80.0 - 100.0 fL   MCH 24.3 (L) 26.0 - 34.0 pg   MCHC 31.6 (L) 32.0 - 36.0 g/dL   RDW 21.2 (H) 11.5 - 14.5 %   Platelets 179 150 - 440 K/uL    Comment: Performed at [facility], [address] [place], [place] [address]  Troponin I     Status: Abnormal   Collection Time: 07/17/18 12:45 PM  Result Value Ref Range   Troponin I 0.03 (HH) <0.03 ng/mL    Comment: CRITICAL RESULT CALLED TO, READ BACK BY AND VERIFIED WITH DEVAN WILKINS ON 07/17/18 AT 1316 BY JAG Performed at [facility], [address] [place], [place] [address]   Brain natriuretic peptide     Status: Abnormal   Collection Time: 07/17/18 12:45 PM  Result Value Ref Range   B Natriuretic Peptide 371.0 (H) 0.0 - 100.0 pg/mL    Comment: Performed at [facility], [address] [place], [place] [address]  Urinalysis, Complete w Microscopic     Status: Abnormal   Collection Time: 07/17/18  3:58 PM  Result Value Ref Range   Color, Urine YELLOW (A) YELLOW   APPearance CLEAR (A) CLEAR   Specific Gravity, Urine 1.019 1.005 - 1.030   pH 7.0 5.0 - 8.0   Glucose, UA NEGATIVE NEGATIVE mg/dL   Hgb urine dipstick NEGATIVE NEGATIVE   Bilirubin Urine NEGATIVE NEGATIVE   Ketones, ur NEGATIVE NEGATIVE mg/dL   Protein, ur NEGATIVE NEGATIVE mg/dL   Nitrite NEGATIVE NEGATIVE   Leukocytes, UA MODERATE (A) NEGATIVE   RBC / HPF 0-5 0 - 5 RBC/hpf   WBC, UA 0-5 0 - 5 WBC/hpf   Bacteria, UA NONE SEEN NONE SEEN   Squamous Epithelial / LPF 0-5 0 - 5    Comment: Performed at [facility], [address] [place], [place] [address]  Troponin I     Status: Abnormal   Collection Time: 07/17/18  3:58 PM  Result Value Ref Range   Troponin I 0.03 (HH) <0.03 ng/mL    Comment: CRITICAL VALUE NOTED. VALUE IS CONSISTENT WITH PREVIOUSLY REPORTED/CALLED VALUE JJB Performed at [facility], [address] [place], [place] [address]   Basic metabolic panel     Status: Abnormal   Collection Time: 07/18/18  5:58 AM  Result Value Ref Range   Sodium 140 135 - 145 mmol/L   Potassium 3.0 (L) 3.5 - 5.1 mmol/L   Chloride 96 (L) 98 - 111 mmol/L   CO2 34 (H) 22 - 32 mmol/L   Glucose, Bld 101 (H) 70 - 99  mg/dL   BUN 10 6 - 20 mg/dL   Creatinine, Ser 0.66 0.44 - 1.00 mg/dL   Calcium 8.2 (L) 8.9 - 10.3 mg/dL   GFR calc non Af Amer >60 >60 mL/min   GFR calc Af Amer >60 >60 mL/min  Comment: (NOTE) The eGFR has been calculated using the CKD EPI equation. This calculation has not been validated in all clinical situations. eGFR's persistently <60 mL/min signify possible Chronic Kidney Disease.    Anion gap 10 5 - 15    Comment: Performed at [facility], [address] [place], [place] [address]  CBC     Status: Abnormal   Collection Time: 07/18/18  5:58 AM  Result Value Ref Range   WBC 5.8 3.6 - 11.0 K/uL   RBC 3.37 (L) 3.80 - 5.20 MIL/uL   Hemoglobin 8.3 (L) 12.0 - 16.0 g/dL   HCT 25.9 (L) 35.0 - 47.0 %   MCV 76.9 (L) 80.0 - 100.0 fL   MCH 24.7 (L) 26.0 - 34.0 pg   MCHC 32.1 32.0 - 36.0 g/dL   RDW 21.3 (H) 11.5 - 14.5 %   Platelets 178 150 - 440 K/uL    Comment: Performed at [facility], [address] [place], [place] [address]    Dg Chest 1 View  Result Date: 07/17/2018 CLINICAL DATA:  Chest pain shortness of breath since yesterday. EXAM: CHEST  1 VIEW COMPARISON:  05/25/2018 FINDINGS: Sternotomy wires are present. Prosthetic mitral valve is present. Lungs are adequately inflated demonstrate hazy bilateral perihilar opacification suggesting mild interstitial edema. No lobar consolidation or effusion. Moderate stable cardiomegaly. Remainder of the exam is unchanged. IMPRESSION: Moderate cardiomegaly with findings suggesting mild interstitial edema. Electronically Signed   By: Marin Olp M.D.   On: 07/17/2018 13:48   Ct Chest W Contrast  Result Date: 07/17/2018 CLINICAL DATA:  52 year old female with acute shortness of breath, fever and chest pain. History of mitral valve replacement on 06/20/2018. EXAM: CT CHEST WITH CONTRAST TECHNIQUE: Multidetector CT imaging of the chest was performed during intravenous contrast administration. CONTRAST:  39m OMNIPAQUE  IOHEXOL 300 MG/ML  SOLN COMPARISON:  12/20/2017 CT.  07/17/2018 and prior radiographs FINDINGS: Cardiovascular: Cardiomegaly again noted. Mitral valve replacement and clip in the LEFT atrial region now noted. Median sternotomy identified with adjacent stranding, likely postoperative. No discrete abscess or soft tissue gas noted. No pericardial effusion. Aortic atherosclerotic calcifications noted without aneurysm. Mediastinum/Nodes: No enlarged mediastinal, hilar, or axillary lymph nodes. Thyroid gland, trachea, and esophagus demonstrate no significant findings. Lungs/Pleura: Mild interstitial opacities are identified which may represent mild interstitial edema. No airspace disease, consolidation, mass or pneumothorax. Trace bilateral pleural effusions are present. Minimal bibasilar atelectasis noted. Upper Abdomen: No acute abnormality Musculoskeletal: No acute or suspicious bony abnormalities. IMPRESSION: 1. Interval mitral valve replacement and LEFT atrial clip since 12/20/2017 CT. Mild interstitial opacities likely representing mild interstitial edema. Trace bilateral pleural effusions 2. Median sternotomy and expected postoperative changes. 3. Cardiomegaly 4.  Aortic Atherosclerosis (ICD10-I70.0). Electronically Signed   By: JMargarette CanadaM.D.   On: 07/17/2018 15:00    Review of Systems  Constitutional: Positive for malaise/fatigue.  HENT: Negative.   Eyes: Negative.   Respiratory: Negative.   Cardiovascular: Positive for chest pain.  Gastrointestinal: Positive for heartburn.  Genitourinary: Negative.   Musculoskeletal: Positive for myalgias.  Skin: Negative.   Neurological: Positive for dizziness.  Endo/Heme/Allergies: Negative.   Psychiatric/Behavioral: Negative.    Blood pressure (!) 103/57, pulse 89, temperature 98.2 F (36.8 C), temperature source Oral, resp. rate 18, height '5\' 4"'$  (1.626 m), weight 129.4 kg, last menstrual period 11/08/2009, SpO2 100 %. Physical Exam  Nursing note and  vitals reviewed. Constitutional: She is oriented to person, place, and time. She appears well-developed and well-nourished.  HENT:  Head: Normocephalic and atraumatic.  Eyes: Pupils are equal, round, and reactive to light. Conjunctivae and EOM are normal.  Neck: Normal range of motion. Neck supple.  Cardiovascular: Normal rate, regular rhythm and normal heart sounds.  Respiratory: Effort normal and breath sounds normal.  GI: Soft. Bowel sounds are normal.  Musculoskeletal: Normal range of motion.  Neurological: She is alert and oriented to person, place, and time. She has normal reflexes.  Skin: Skin is warm and dry.  Psychiatric: She has a normal mood and affect.    Assessment/Plan: Chest pain Vertigo Status post valve replacement Atrial fibrillation COPD Coronary artery disease GERD Hypertension Congestive heart failure Asthma . Plan Agree with admission for further evaluation Continue anticoagulation with Xarelto Inhalers for COPD should be continued Agree with therapy for anxiety Consider echocardiogram for further assessment Consider diuretic therapy for mild weight gain Do not recommend cardiac cath at this point  Dwayne D Callwood 07/18/2018, 4:50 PM

## 2018-07-18 NOTE — Progress Notes (Signed)
BP medications held today per MD d/t soft BP.  Continue to monitor.

## 2018-07-19 LAB — BASIC METABOLIC PANEL
Anion gap: 6 (ref 5–15)
BUN: 12 mg/dL (ref 6–20)
CHLORIDE: 98 mmol/L (ref 98–111)
CO2: 34 mmol/L — ABNORMAL HIGH (ref 22–32)
CREATININE: 0.69 mg/dL (ref 0.44–1.00)
Calcium: 8.1 mg/dL — ABNORMAL LOW (ref 8.9–10.3)
Glucose, Bld: 104 mg/dL — ABNORMAL HIGH (ref 70–99)
POTASSIUM: 3.7 mmol/L (ref 3.5–5.1)
Sodium: 138 mmol/L (ref 135–145)

## 2018-07-19 LAB — URINE CULTURE: Special Requests: NORMAL

## 2018-07-19 MED ORDER — FUROSEMIDE 10 MG/ML IJ SOLN
80.0000 mg | Freq: Two times a day (BID) | INTRAMUSCULAR | Status: DC
  Administered 2018-07-19 – 2018-07-20 (×2): 80 mg via INTRAVENOUS
  Filled 2018-07-19 (×2): qty 8

## 2018-07-19 MED ORDER — POLYETHYLENE GLYCOL 3350 17 G PO PACK
17.0000 g | PACK | Freq: Every day | ORAL | Status: DC | PRN
  Administered 2018-07-19: 17 g via ORAL
  Filled 2018-07-19: qty 1

## 2018-07-19 MED ORDER — POLYETHYLENE GLYCOL 3350 17 G PO PACK: 17.0000 g | PACK | Freq: Every day | ORAL | Status: DC

## 2018-07-19 NOTE — Progress Notes (Signed)
[facility] [place] NAME: Chelsea Ramsey    MR#:  [id number]  DATE OF BIRTH:  1966/08/15  SUBJECTIVE: Says that her shortness of breath is not much better than yesterday.  CHIEF COMPLAINT:   Chief Complaint  Patient presents with  . Chest Pain  Complains of chest pain due to recent surgery.  Chest scar is s not infected.Marland Kitchen  REVIEW OF SYSTEMS:   ROS CONSTITUTIONAL: No fever, fatigue or weakness.  EYES: No blurred or double vision.  EARS, NOSE, AND THROAT: No tinnitus or ear pain.  RESPIRATORY: Mild shortness of breath, postoperative chest discomfort. CARDIOVASCULAR: No chest pain, orthopnea, edema.  GASTROINTESTINAL: No nausea, vomiting, diarrhea or abdominal pain.  GENITOURINARY: No dysuria, hematuria.  ENDOCRINE: No polyuria, nocturia,  HEMATOLOGY: No anemia, easy bruising or bleeding SKIN: No rash or lesion. MUSCULOSKELETAL: No joint pain or arthritis.   NEUROLOGIC: No tingling, numbness, weakness.  PSYCHIATRY: No anxiety or depression.   DRUG ALLERGIES:   Allergies  Allergen Reactions  . Amiodarone Nausea And Vomiting  . Aspirin Swelling  . Flexeril [Cyclobenzaprine] Swelling  . Morphine And Related Swelling  . Trazamine [Trazodone & Diet Manage Prod] Nausea And Vomiting  . Codeine Rash  . Tramadol Rash    VITALS:  Blood pressure (!) 106/55, pulse 79, temperature 98 F (36.7 C), temperature source Oral, resp. rate 16, height 5\' 4"  (1.626 m), weight 129.1 kg, last menstrual period 11/08/2009, SpO2 100 %.  PHYSICAL EXAMINATION:  GENERAL:  52 y.o.-year-old patient lying in the bed with no acute distress.  EYES: Pupils equal, round, reactive to light and accommodation. No scleral icterus. Extraocular muscles intact.  HEENT: Head atraumatic, normocephalic. Oropharynx and nasopharynx clear.  NECK:  Supple, no jugular venous distention. No thyroid enlargement, no tenderness.  LUNGS: Normal breath sounds bilaterally,  no wheezing, rales,rhonchi or crepitation. No use of accessory muscles of respiration.  CARDIOVASCULAR: S1, S2 normal. No murmurs, rubs, or gallops.  ABDOMEN: Soft, nontender, nondistended. Bowel sounds present. No organomegaly or mass.  EXTREMITIES: No pedal edema, cyanosis, or clubbing.  NEUROLOGIC: Cranial nerves II through XII are intact. Muscle strength 5/5 in all extremities. Sensation intact. Gait not checked.  PSYCHIATRIC: The patient is alert and oriented x 3.  SKIN: No obvious rash, lesion, or ulcer.    LABORATORY PANEL:   CBC Recent Labs  Lab 07/18/18 0558  WBC 5.8  HGB 8.3*  HCT 25.9*  PLT 178   ------------------------------------------------------------------------------------------------------------------  Chemistries  Recent Labs  Lab 07/19/18 0345  NA 138  K 3.7  CL 98  CO2 34*  GLUCOSE 104*  BUN 12  CREATININE 0.69  CALCIUM 8.1*   ------------------------------------------------------------------------------------------------------------------  Cardiac Enzymes Recent Labs  Lab 07/17/18 1558  TROPONINI 0.03*   ------------------------------------------------------------------------------------------------------------------  RADIOLOGY:  No results found.  EKG:   Orders placed or performed during the hospital encounter of 07/17/18  . EKG 12-Lead  . EKG 12-Lead  . ED EKG within 10 minutes  . ED EKG within 10 minutes    ASSESSMENT AND PLAN:   Acute on chronic diastolic heart failure, admitted to telemetry, continue Bumex, check urine output daily.  Start IV Lasix at a higher dose to see if her symptoms get better in respect to shortness of breath. Follow echocardiogram, appreciate cardiology following the patient. 2.  Chronic atrial fibrillation, rate controlled, continue beta-blockers.,  Calcium channel blockers 3.  recentmitral valve surgery, patient is on Xarelto. 4.  Postoperative chest discomfort, continue pain medicines,  troponins have  been negative.  Continue IV Dilaudid for pain control 5.  Hypokalemia, improved. 6 chronic respiratory failure, patient is on 2 L of oxygen chronically. 7 mild UTI, urine cultures are showing mixed species, discontinue IV antibiotics.   .  Than 50% of time spent in counseling, coordination of care   All the records are reviewed and case discussed with Care Management/Social Workerr. Management plans discussed with the patient, family and they are in agreement.  CODE STATUS: Full code  TOTAL TIME TAKING CARE OF THIS PATIENT: 40 minutes.   POSSIBLE D/C IN 1-2DAYS, DEPENDING ON CLINICAL CONDITION.   Epifanio Lesches M.D on 07/19/2018 at 2:45 PM  Between 7am to 6pm - Pager - [phone]  After 6pm go to [email or web address] - password [email or web address] [facility]  Office  [phone]  CC: Primary care physician; Dagmar Hait, MD   Note: This dictation was prepared with Dragon dictation along with smaller phrase technology. Any transcriptional errors that result from this process are unintentional.

## 2018-07-20 ENCOUNTER — Inpatient Hospital Stay: Payer: Medicaid Other

## 2018-07-20 ENCOUNTER — Inpatient Hospital Stay
Admit: 2018-07-20 | Discharge: 2018-07-20 | Disposition: A | Discharge status: Home / Self Care | Payer: Medicaid Other | Attending: Internal Medicine | Admitting: Internal Medicine

## 2018-07-20 LAB — BASIC METABOLIC PANEL
ANION GAP: 9 (ref 5–15)
BUN: 10 mg/dL (ref 6–20)
CHLORIDE: 97 mmol/L — AB (ref 98–111)
CO2: 36 mmol/L — AB (ref 22–32)
Calcium: 7.8 mg/dL — ABNORMAL LOW (ref 8.9–10.3)
Creatinine, Ser: 0.57 mg/dL (ref 0.44–1.00)
GFR calc non Af Amer: >60 mL/min (ref >60)
Glucose, Bld: 104 mg/dL — ABNORMAL HIGH (ref 70–99)
POTASSIUM: 3.3 mmol/L — AB (ref 3.5–5.1)
Sodium: 142 mmol/L (ref 135–145)

## 2018-07-20 LAB — MAGNESIUM: MAGNESIUM: 1.6 mg/dL — AB (ref 1.7–2.4)

## 2018-07-20 MED ORDER — MAGNESIUM SULFATE 2 GM/50ML IV SOLN
2.0000 g | Freq: Once | INTRAVENOUS | Status: AC
  Administered 2018-07-20: 2 g via INTRAVENOUS
  Filled 2018-07-20: qty 50

## 2018-07-20 MED ORDER — HYDROCOD POLST-CPM POLST ER 10-8 MG/5ML PO SUER: 5.0000 mL | Freq: Two times a day (BID) | ORAL | Status: DC | PRN

## 2018-07-20 MED ORDER — LACTULOSE 10 GM/15ML PO SOLN
30.0000 g | Freq: Two times a day (BID) | ORAL | Status: DC | PRN
  Administered 2018-07-20: 30 g via ORAL
  Filled 2018-07-20: qty 60

## 2018-07-20 MED ORDER — POTASSIUM CHLORIDE CRYS ER 20 MEQ PO TBCR
40.0000 meq | EXTENDED_RELEASE_TABLET | ORAL | Status: AC
  Administered 2018-07-20 (×2): 40 meq via ORAL
  Filled 2018-07-20 (×2): qty 2

## 2018-07-20 MED ORDER — OXYCODONE-ACETAMINOPHEN 5-325 MG PO TABS
1.0000 | ORAL_TABLET | Freq: Four times a day (QID) | ORAL | Status: DC | PRN
  Administered 2018-07-20 – 2018-07-21 (×3): 2 via ORAL
  Filled 2018-07-20 (×3): qty 2

## 2018-07-20 MED ORDER — FUROSEMIDE 10 MG/ML IJ SOLN
60.0000 mg | Freq: Two times a day (BID) | INTRAMUSCULAR | Status: DC
  Administered 2018-07-20 – 2018-07-21 (×2): 60 mg via INTRAVENOUS
  Filled 2018-07-20 (×2): qty 6

## 2018-07-20 NOTE — Progress Notes (Signed)
[facility] at [place] NAME: Chelsea Ramsey    MR#:  [id number]  DATE OF BIRTH:  12/24/1965  Shortness of breath is better.  Complains of abdominal pain, constipation  CHIEF COMPLAINT:   Chief Complaint  Patient presents with  . Chest Pain  C postoperative chest pain at the scar site, abdominal pain, constipation, nausea.  REVIEW OF SYSTEMS:   ROS CONSTITUTIONAL: No fever, fatigue or weakness.  EYES: No blurred or double vision.  EARS, NOSE, AND THROAT: No tinnitus or ear pain.  RESPIRATORY: Mild shortness of breath, postoperative chest discomfort. CARDIOVASCULAR: No chest pain, orthopnea, edema.  GASTROINTESTINAL: Abdominal pain, nausea, bloating. GENITOURINARY: No dysuria, hematuria.  ENDOCRINE: No polyuria, nocturia,  HEMATOLOGY: No anemia, easy bruising or bleeding SKIN: No rash or lesion. MUSCULOSKELETAL: No joint pain or arthritis.   NEUROLOGIC: No tingling, numbness, weakness.  PSYCHIATRY: No anxiety or depression.   DRUG ALLERGIES:   Allergies  Allergen Reactions  . Amiodarone Nausea And Vomiting  . Aspirin Swelling  . Flexeril [Cyclobenzaprine] Swelling  . Morphine And Related Swelling  . Trazamine [Trazodone & Diet Manage Prod] Nausea And Vomiting  . Codeine Rash  . Tramadol Rash    VITALS:  Blood pressure 139/69, pulse 82, temperature 98.2 F (36.8 C), temperature source Oral, resp. rate 18, height 5\' 4"  (1.626 m), weight 127.6 kg, last menstrual period 11/08/2009, SpO2 99 %.  PHYSICAL EXAMINATION:  GENERAL:  52 y.o.-year-old patient lying in the bed with no acute distress.  EYES: Pupils equal, round, reactive to light and accommodation. No scleral icterus. Extraocular muscles intact.  HEENT: Head atraumatic, normocephalic. Oropharynx and nasopharynx clear.  NECK:  Supple, no jugular venous distention. No thyroid enlargement, no tenderness.  LUNGS: Normal breath sounds bilaterally, no wheezing,  rales,rhonchi or crepitation. No use of accessory muscles of respiration.  CARDIOVASCULAR: S1, S2 normal. No murmurs, rubs, or gallops.  ABDOMEN: Soft, nontender, nondistended. Bowel sounds present. No organomegaly or mass.  EXTREMITIES: No pedal edema, cyanosis, or clubbing.  NEUROLOGIC: Cranial nerves II through XII are intact. Muscle strength 5/5 in all extremities. Sensation intact. Gait not checked.  PSYCHIATRIC: The patient is alert and oriented x 3.  SKIN: No obvious rash, lesion, or ulcer.    LABORATORY PANEL:   CBC Recent Labs  Lab 07/18/18 0558  WBC 5.8  HGB 8.3*  HCT 25.9*  PLT 178   ------------------------------------------------------------------------------------------------------------------  Chemistries  Recent Labs  Lab 07/20/18 0457  NA 142  K 3.3*  CL 97*  CO2 36*  GLUCOSE 104*  BUN 10  CREATININE 0.57  CALCIUM 7.8*   ------------------------------------------------------------------------------------------------------------------  Cardiac Enzymes Recent Labs  Lab 07/17/18 1558  TROPONINI 0.03*   ------------------------------------------------------------------------------------------------------------------  RADIOLOGY:  Dg Abd Portable 1v  Result Date: 07/20/2018 CLINICAL DATA:  Abdominal pain EXAM: PORTABLE ABDOMEN - 1 VIEW COMPARISON:  Chest CT from 3 days ago FINDINGS: Diffuse gas without obstructive pattern or over distention. Negative for stool retention. No concerning mass effect or gas collection. Cardiomegaly and postoperative heart.  No acute finding. IMPRESSION: Diffuse bowel gas without obstructive pattern.  No stool retention. Electronically Signed   By: Monte Fantasia M.D.   On: 07/20/2018 09:46    EKG:   Orders placed or performed during the hospital encounter of 07/17/18  . EKG 12-Lead  . EKG 12-Lead  . ED EKG within 10 minutes  . ED EKG within 10 minutes    ASSESSMENT AND PLAN:   Acute on chronic diastolic  heart  failure, admitted to telemetry, continue Bumex, check urine output daily.  Given high-dose IV Lasix, she says that she is feeling better, echocardiogram is done this morning, await for results.  2.  Chronic atrial fibrillation, rate controlled, continue beta-blockers.,  Calcium channel blockers 3.  recentmitral valve surgery, patient is on Xarelto, postoperative chest discomfort, continue pain medicines.. 4.  Postoperative chest discomfort, continue pain medicines, troponins have been negative.  Continue IV Dilaudid for pain control 5.  Hypokalemia, due to diuretics, continue to replace.   6 chronic respiratory failure, patient is on 2 L of oxygen chronically.  COPD, continue Dulera, albuterol nebulizer. 7 mild UTI, urine cultures are showing mixed species, discontinue IV antibiotics. #8 abdominal pain, constipation: X-ray abdomen is essentially within normal range, added lactulose.  .  Than 50% of time spent in counseling, coordination of care   All the records are reviewed and case discussed with Care Management/Social Workerr. Management plans discussed with the patient, family and they are in agreement.  CODE STATUS: Full code  TOTAL TIME TAKING CARE OF THIS PATIENT: 40 minutes.   POSSIBLE D/C IN 1-2DAYS, DEPENDING ON CLINICAL CONDITION. Than 50% time spent in counseling, coordination of care and likely discharge tomorrow morning.  With home health.  Epifanio Lesches M.D on 07/20/2018 at 12:22 PM  Between 7am to 6pm - Pager - [phone]  After 6pm go to [email or web address] - password [email or web address] [facility]  Office  [phone]  CC: Primary care physician; Dagmar Hait, MD   Note: This dictation was prepared with Dragon dictation along with smaller phrase technology. Any transcriptional errors that result from this process are unintentional.

## 2018-07-20 NOTE — Progress Notes (Signed)
*  PRELIMINARY RESULTS* Echocardiogram 2D Echocardiogram has been performed.  Chelsea Ramsey 07/20/2018, 11:52 AM

## 2018-07-21 LAB — BASIC METABOLIC PANEL
Anion gap: 9 (ref 5–15)
BUN: 9 mg/dL (ref 6–20)
CALCIUM: 8.3 mg/dL — AB (ref 8.9–10.3)
CHLORIDE: 98 mmol/L (ref 98–111)
CO2: 32 mmol/L (ref 22–32)
Creatinine, Ser: 0.5 mg/dL (ref 0.44–1.00)
GFR calc Af Amer: >60 mL/min (ref >60)
GFR calc non Af Amer: >60 mL/min (ref >60)
GLUCOSE: 106 mg/dL — AB (ref 70–99)
Potassium: 3.8 mmol/L (ref 3.5–5.1)
Sodium: 139 mmol/L (ref 135–145)

## 2018-07-21 LAB — ECHOCARDIOGRAM COMPLETE
HEIGHTINCHES: 64 in
Weight: 4500.8 oz

## 2018-07-21 MED ORDER — NYSTATIN 100000 UNIT/GM EX POWD: Freq: Two times a day (BID) | CUTANEOUS | 0 refills | Status: DC | PRN

## 2018-07-21 MED ORDER — LACTULOSE 10 GM/15ML PO SOLN: 30.0000 g | Freq: Two times a day (BID) | ORAL | 0 refills | Status: DC | PRN

## 2018-07-21 MED ORDER — FUROSEMIDE 40 MG PO TABS: 40.0000 mg | ORAL_TABLET | Freq: Two times a day (BID) | ORAL | 0 refills | Status: DC

## 2018-07-21 MED ORDER — OXYCODONE-ACETAMINOPHEN 5-325 MG PO TABS: 1.0000 | ORAL_TABLET | Freq: Four times a day (QID) | ORAL | 0 refills | Status: DC | PRN

## 2018-07-21 MED ORDER — NYSTATIN 100000 UNIT/GM EX POWD
Freq: Two times a day (BID) | CUTANEOUS | Status: DC | PRN
  Filled 2018-07-21: qty 15

## 2018-07-21 MED ORDER — BISACODYL 5 MG PO TBEC: 10.0000 mg | DELAYED_RELEASE_TABLET | Freq: Every day | ORAL | 0 refills | Status: DC | PRN

## 2018-07-21 MED ORDER — PROMETHAZINE HCL 12.5 MG PO TABS: 12.5000 mg | ORAL_TABLET | Freq: Four times a day (QID) | ORAL | 0 refills | Status: DC | PRN

## 2018-07-21 MED ORDER — BUMETANIDE 2 MG PO TABS: 4.0000 mg | ORAL_TABLET | Freq: Every day | ORAL | 0 refills | Status: DC

## 2018-07-21 MED ORDER — METOPROLOL TARTRATE 25 MG PO TABS: 25.0000 mg | ORAL_TABLET | Freq: Two times a day (BID) | ORAL | 0 refills | Status: DC

## 2018-07-21 NOTE — Progress Notes (Signed)
Patient ambulated around nursing station with 02.  No shortness of breath or chest pain experienced.  Potential discharge this afternoon.

## 2018-07-21 NOTE — Care Management Note (Signed)
Case Management Note  Patient Details  Name: Chelsea Ramsey MRN: [id number] Date of Birth: 31-Jul-1966  Subjective/Objective: Patient to be discharged per MD order. Orders in place for home health services. Patient currently open with [facility] health. Orders to resume these services. Referral placed and [facility] is able to resume. Patient on chronic O2 through [facility] medical and requires no other DME. Family providing transport.      [name] RN BSN RNCM [phone]                 Action/Plan:   Expected Discharge Date:  07/21/18               Expected Discharge Plan:  [place]  In-House Referral:     Discharge planning Services  CM Consult  Post Acute Care Choice:  Home Health Choice offered to:  Patient  DME Arranged:    DME Agency:     HH Arranged:  RN, PT [place] Agency:  [facility]  Status of Service:  Completed, signed off  If discussed at [place] of Stay Meetings, dates discussed:    Additional Comments:  [name], RN 07/21/2018, 3:05 PM

## 2018-07-21 NOTE — Discharge Summary (Signed)
Chelsea Ramsey, is a 52 y.o. female  DOB [birth year]  MRN [id number].  Admission date:  07/17/2018  Admitting Physician  Nicholes Mango, MD  Discharge Date:  07/21/2018   Primary MD  Dagmar Hait, MD  Recommendations for primary care physician for things to follow:   PCP in 1 week Follow-up with  CT surgery at [facility] with her recent surgery and the postsurgical scar in the chest as scheduled.   Admission Diagnosis  Dyspnea on exertion [R06.09] Elevated troponin I level [R74.8] Chest pain [R07.9] Acute on chronic combined systolic and diastolic CHF (congestive heart failure) (HCC) [I50.43] Chest pain, unspecified type [R07.9]   Discharge Diagnosis  Dyspnea on exertion [R06.09] Elevated troponin I level [R74.8] Chest pain [R07.9] Acute on chronic combined systolic and diastolic CHF (congestive heart failure) (HCC) [I50.43] Chest pain, unspecified type [R07.9]    Active Problems:   Acute CHF (congestive heart failure) ([place])      Past Medical History:  Diagnosis Date  . Allergy    seasonal  . Anxiety   . Arthritis    Right Knee  . Asthma   . CHF (congestive heart failure) ([place])   . COPD (chronic obstructive pulmonary disease) ([place])   . Coronary artery disease    Leaky heart valve  . Fibromyalgia   . GERD (gastroesophageal reflux disease)   . Hypertension   . Pneumonia   . PUD (peptic ulcer disease)   . Pulmonary HTN ([place])   . Rheumatic fever/heart disease   . Sleep apnea     Past Surgical History:  Procedure Laterality Date  . ADENOIDECTOMY    . ESOPHAGOGASTRODUODENOSCOPY (EGD) WITH PROPOFOL N/A 05/25/2018   Procedure: ESOPHAGOGASTRODUODENOSCOPY (EGD) WITH PROPOFOL;  Surgeon: Lucilla Lame, MD;  Location: [facility] ENDOSCOPY;  Service: Endoscopy;  Laterality: N/A;  . MITRAL VALVE REPLACEMENT    . MULTIPLE  TOOTH EXTRACTIONS    . TONSILLECTOMY    . TOTAL KNEE ARTHROPLASTY Right 09/04/2016   Procedure: TOTAL KNEE ARTHROPLASTY; with lateral release;  Surgeon: Earlie Server, MD;  Location: [place];  Service: Orthopedics;  Laterality: Right;       History of present illness and  Hospital Course:     Kindly see H&P for history of present illness and admission details, please review complete Labs, Consult reports and Test reports for all details in brief  HPI  from the history and physical done on the day of admission 52 year old female patient with history of hypertension, diabetes admitted because of shortness of breath, chest pain.   Hospital Course  Acute on chronic diastolic heart failure, patient receiving Bumex before she came to hospital increase the dose of Bumex, received IV Lasix at a higher dose, patient lost about 9 pounds since she is here.  Patient can go home today with 4 mg daily. Regarding diastolic heart failure patient is on beta-blockers but the dose is not clear so patient will go home with Lopressor 25 mg p.o. twice daily, Cardizem has been added by her cardiologist.  Since seen by Dr. Clayborn Bigness from cardiology, echocardiogram done this admission showed EF more than 55%. 2.  Chronic atrial fibrillation, recent mitral valve replacement at [facility], patient is on Cardizem, Eliquis, patient still has chest wall pain due to chest a scar from surgery recently.  Given limited supply of Percocet, Phenergan. 3.  COPD without any exacerbation: Patient is on chronic oxygen at 2 L. 4.  Otitis urine culture did not show any infection.  Patient complains of  coronary discharge, vaginal resting started on nystatin. 4.  She has multiple chronic problems of anxiety, fibromyalgia . , status post recent surgery for mitral valve stenosis by [facility] cardiology.  Discharge Condition: Full code   Follow UP  Follow-up Information    Schedule an appointment as soon as possible for a visit with Isaias Cowman, MD.   Specialty:  Cardiology Why:  Please get referral from Primary Care Doctor Contact information: [facility] [place] [address] [phone]        [place] Follow up on 07/26/2018.   Specialty:  Cardiology Why:  at 9:00am Contact information: [place], [place], MD. Daphane Shepherd on 07/26/2018.   Specialty:  Internal Medicine Why:  appointment at 2pm.  Phone: [phone] Contact information: [address] [place] [address] [phone]             Discharge Instructions  and  Discharge Medications      Allergies as of 07/21/2018      Reactions   Amiodarone Nausea And Vomiting   Aspirin Swelling   Flexeril [cyclobenzaprine] Swelling   Morphine And Related Swelling   Trazamine [trazodone & Diet Manage Prod] Nausea And Vomiting   Codeine Rash   Tramadol Rash      Medication List    STOP taking these medications   butalbital-acetaminophen-caffeine 50-325-40 MG tablet Commonly known as:  FIORICET, ESGIC   carvedilol 6.25 MG tablet Commonly known as:  COREG   chlorpheniramine-HYDROcodone 10-8 MG/5ML Suer Commonly known as:  TUSSIONEX   colchicine 0.6 MG tablet   docusate sodium 100 MG capsule Commonly known as:  COLACE   metoprolol succinate 50 MG 24 hr tablet Commonly known as:  TOPROL-XL   zolpidem 10 MG tablet Commonly known as:  AMBIEN     TAKE these medications   bisacodyl 5 MG EC tablet Commonly known as:  DULCOLAX Take 2 tablets (10 mg total) by mouth daily as needed for moderate constipation.   bumetanide 2 MG tablet Commonly known as:  BUMEX Take 2 tablets (4 mg total) by mouth daily. Start taking on:  07/22/2018 What changed:    medication strength  how much to take  when to take this   diltiazem 240 MG 24 hr capsule Commonly known as:   CARDIZEM CD Take 1 capsule (240 mg total) by mouth daily.   ferrous sulfate 325 (65 FE) MG tablet Take 325 mg by mouth daily.   furosemide 40 MG tablet Commonly known as:  LASIX Take 1 tablet (40 mg total) by mouth 2 (two) times daily.   gabapentin 300 MG capsule Commonly known as:  NEURONTIN Take 600 mg by mouth 3 (three) times daily.   ipratropium-albuterol 0.5-2.5 (3) MG/3ML Soln Commonly known as:  DUONEB Inhale 3 mLs into the lungs every 8 (eight) hours as needed (respiratory).   lactulose 10 GM/15ML solution Commonly known as:  CHRONULAC Take 45 mLs (30 g total) by mouth 2 (two) times daily as needed for mild constipation.   magnesium oxide 400 MG tablet Commonly known as:  MAG-OX Take 400 mg by mouth 2 (two) times daily.   metoprolol tartrate 25 MG tablet Commonly known as:  LOPRESSOR Take 1 tablet (25 mg total) by mouth 2 (two) times daily.   nystatin powder Commonly known as:  MYCOSTATIN/NYSTOP Apply topically 2 (two) times daily as  needed (yeast).   omeprazole 40 MG capsule Commonly known as:  PRILOSEC Take 40 mg by mouth 2 (two) times daily.   oxyCODONE-acetaminophen 5-325 MG tablet Commonly known as:  PERCOCET/ROXICET Take 1-2 tablets by mouth every 6 (six) hours as needed for severe pain.   pantoprazole 40 MG tablet Commonly known as:  PROTONIX Take 1 tablet (40 mg total) by mouth 2 (two) times daily before a meal.   promethazine 12.5 MG tablet Commonly known as:  PHENERGAN Take 1 tablet (12.5 mg total) by mouth every 6 (six) hours as needed for nausea or vomiting.   rivaroxaban 20 MG Tabs tablet Commonly known as:  XARELTO Take 20 mg by mouth daily.   SYMBICORT 160-4.5 MCG/ACT inhaler Generic drug:  budesonide-formoterol Inhale 2 puffs into the lungs 2 (two) times daily.         Diet and Activity recommendation: See Discharge Instructions above   Consults obtained -cardiology   Major procedures and Radiology Reports - PLEASE review  detailed and final reports for all details, in brief -      Dg Chest 1 View  Result Date: 07/17/2018 CLINICAL DATA:  Chest pain shortness of breath since yesterday. EXAM: CHEST  1 VIEW COMPARISON:  05/25/2018 FINDINGS: Sternotomy wires are present. Prosthetic mitral valve is present. Lungs are adequately inflated demonstrate hazy bilateral perihilar opacification suggesting mild interstitial edema. No lobar consolidation or effusion. Moderate stable cardiomegaly. Remainder of the exam is unchanged. IMPRESSION: Moderate cardiomegaly with findings suggesting mild interstitial edema. Electronically Signed   By: Marin Olp M.D.   On: 07/17/2018 13:48   Ct Chest W Contrast  Result Date: 07/17/2018 CLINICAL DATA:  52 year old female with acute shortness of breath, fever and chest pain. History of mitral valve replacement on 06/20/2018. EXAM: CT CHEST WITH CONTRAST TECHNIQUE: Multidetector CT imaging of the chest was performed during intravenous contrast administration. CONTRAST:  46mL OMNIPAQUE IOHEXOL 300 MG/ML  SOLN COMPARISON:  12/20/2017 CT.  07/17/2018 and prior radiographs FINDINGS: Cardiovascular: Cardiomegaly again noted. Mitral valve replacement and clip in the LEFT atrial region now noted. Median sternotomy identified with adjacent stranding, likely postoperative. No discrete abscess or soft tissue gas noted. No pericardial effusion. Aortic atherosclerotic calcifications noted without aneurysm. Mediastinum/Nodes: No enlarged mediastinal, hilar, or axillary lymph nodes. Thyroid gland, trachea, and esophagus demonstrate no significant findings. Lungs/Pleura: Mild interstitial opacities are identified which may represent mild interstitial edema. No airspace disease, consolidation, mass or pneumothorax. Trace bilateral pleural effusions are present. Minimal bibasilar atelectasis noted. Upper Abdomen: No acute abnormality Musculoskeletal: No acute or suspicious bony abnormalities. IMPRESSION: 1.  Interval mitral valve replacement and LEFT atrial clip since 12/20/2017 CT. Mild interstitial opacities likely representing mild interstitial edema. Trace bilateral pleural effusions 2. Median sternotomy and expected postoperative changes. 3. Cardiomegaly 4.  Aortic Atherosclerosis (ICD10-I70.0). Electronically Signed   By: Margarette Canada M.D.   On: 07/17/2018 15:00   Dg Abd Portable 1v  Result Date: 07/20/2018 CLINICAL DATA:  Abdominal pain EXAM: PORTABLE ABDOMEN - 1 VIEW COMPARISON:  Chest CT from 3 days ago FINDINGS: Diffuse gas without obstructive pattern or over distention. Negative for stool retention. No concerning mass effect or gas collection. Cardiomegaly and postoperative heart.  No acute finding. IMPRESSION: Diffuse bowel gas without obstructive pattern.  No stool retention. Electronically Signed   By: Monte Fantasia M.D.   On: 07/20/2018 09:46    Micro Results     Recent Results (from the past 240 hour(s))  Urine Culture  Status: Abnormal   Collection Time: 07/17/18  3:58 PM  Result Value Ref Range Status   Specimen Description   Final    URINE, CLEAN CATCH Performed at [facility], [address], [place], [place] [address]    Special Requests   Final    Normal Performed at [facility], [address] [place], [place] [address]    Culture MULTIPLE SPECIES PRESENT, SUGGEST RECOLLECTION (A)  Final   Report Status 07/19/2018 FINAL  Final       Today   Subjective:   Chelsea Ramsey today  feels better, stable for discharge.  Objective:   Blood pressure 119/73, pulse 82, temperature 98.1 F (36.7 C), temperature source Oral, resp. rate 18, height 5\' 4"  (1.626 m), weight 126.1 kg, last menstrual period 11/08/2009, SpO2 100 %.   Intake/Output Summary (Last 24 hours) at 07/21/2018 1404 Last data filed at 07/21/2018 1100 Gross per 24 hour  Intake -  Output 3400 ml  Net -3400 ml    Exam Awake Alert, Oriented x 3, No new F.N deficits, Normal  affect .AT,PERRAL Supple Neck,No JVD, No cervical lymphadenopathy appriciated.  Symmetrical Chest wall movement, Good air movement bilaterally, CTAB RRR,No Gallops,Rubs or new Murmurs, No Parasternal Heave +ve B.Sounds, Abd Soft, Non tender, No organomegaly appriciated, No rebound -guarding or rigidity. No Cyanosis, Clubbing or edema, No new Rash or bruise  Data Review   CBC w Diff:  Lab Results  Component Value Date   WBC 5.8 07/18/2018   HGB 8.3 (L) 07/18/2018   HGB 13.3 02/20/2015   HCT 25.9 (L) 07/18/2018   HCT 41.4 02/20/2015   PLT 178 07/18/2018   PLT 182 02/20/2015   LYMPHOPCT 11 02/24/2018   LYMPHOPCT 18.0 02/20/2015   MONOPCT 6 02/24/2018   MONOPCT 8.8 02/20/2015   EOSPCT 1 02/24/2018   EOSPCT 1.3 02/20/2015   BASOPCT 1 02/24/2018   BASOPCT 0.4 02/20/2015    CMP:  Lab Results  Component Value Date   NA 139 07/21/2018   NA 138 02/20/2015   K 3.8 07/21/2018   K 3.7 02/20/2015   CL 98 07/21/2018   CL 104 02/20/2015   CO2 32 07/21/2018   CO2 26 02/20/2015   BUN 9 07/21/2018   BUN 14 02/20/2015   CREATININE 0.50 07/21/2018   CREATININE 0.58 02/20/2015   GLU 79 08/21/2014   PROT 7.5 05/22/2018   PROT 7.5 02/20/2015   ALBUMIN 3.7 05/22/2018   ALBUMIN 3.8 02/20/2015   BILITOT 0.4 05/22/2018   BILITOT 1.0 02/20/2015   ALKPHOS 96 05/22/2018   ALKPHOS 158 (H) 02/20/2015   AST 21 05/22/2018   AST 41 02/20/2015   ALT 22 05/22/2018   ALT 32 02/20/2015  .   Total Time in preparing paper work, data evaluation and todays exam - 88 minutes  Epifanio Lesches M.D on 07/21/2018 at 2:04 PM    Note: This dictation was prepared with Dragon dictation along with smaller phrase technology. Any transcriptional errors that result from this process are unintentional.

## 2018-07-25 NOTE — Progress Notes (Signed)
Patient ID: Chelsea Ramsey, female    DOB: [birth year], 52 y.o.   MRN: [id number]  HPI  Ms Chelsea Ramsey is a 52 y/o female with a history of CAD (leaky valve), HTN, pulmonary HTN, COPD, GERD, obstructive sleep apnea, previous tobacco use and chronic heart failure.   Echo report from 07/20/18 reviewed and showed an EF of 50-55% along with mild MR and an elevated PA pressure of 50 mm Hg.   Admitted 07/17/18 due to acute on chronic heart failure. Initially given IV diuretics and then transitioned to oral diuretics. Cardiology consult obtained. Discharged after 4 days. Admitted 06/20/18 due to having her mitral valve replaced. Dobutamine and epinephrine were weaned and she was extubated. Had atrial fibrillation exacerbation which involved medication adjustments. Had cardioversion done 06/30/18 with resultant short time in NSR but then reverted back to atrial fibrillation. Unable to tolerate amiodarone due to nausea and vomiting. Discharged after 22 days.   She presents today for her initial visit with a chief complaint of moderate fatigue upon minimal exertion. She says that this has been present for several months ever since her valve surgery. She has associated blurry vision at times, chest pressure, nausea, light-headedness, anxiety and difficulty sleeping along with this. She denies abdominal distention, palpitations, pedal edema, shortness of breath, cough or weight gain.   Past Medical History:  Diagnosis Date  . Allergy    seasonal  . Anxiety   . Arthritis    Right Knee  . Asthma   . CHF (congestive heart failure) ([place])   . COPD (chronic obstructive pulmonary disease) ([place])   . Coronary artery disease    Leaky heart valve  . Fibromyalgia   . GERD (gastroesophageal reflux disease)   . Hypertension   . Pneumonia   . PUD (peptic ulcer disease)   . Pulmonary HTN ([place])   . Rheumatic fever/heart disease   . Sleep apnea    Past Surgical History:  Procedure Laterality Date  . ADENOIDECTOMY     . ESOPHAGOGASTRODUODENOSCOPY (EGD) WITH PROPOFOL N/A 05/25/2018   Procedure: ESOPHAGOGASTRODUODENOSCOPY (EGD) WITH PROPOFOL;  Surgeon: Lucilla Lame, MD;  Location: [facility] ENDOSCOPY;  Service: Endoscopy;  Laterality: N/A;  . MITRAL VALVE REPLACEMENT    . MULTIPLE TOOTH EXTRACTIONS    . TONSILLECTOMY    . TOTAL KNEE ARTHROPLASTY Right 09/04/2016   Procedure: TOTAL KNEE ARTHROPLASTY; with lateral release;  Surgeon: Earlie Server, MD;  Location: [place];  Service: Orthopedics;  Laterality: Right;   Family History  Problem Relation Age of Onset  . COPD Mother   . Cancer Mother        Bone  . Asthma Mother   . Congestive Heart Failure Father    Social History   Tobacco Use  . Smoking status: Former Smoker    Packs/day: 0.50    Types: Cigarettes  . Smokeless tobacco: Never Used  Substance Use Topics  . Alcohol use: Not Currently    Alcohol/week: 3.0 standard drinks    Types: 3 Cans of beer per week    Comment: 16 oz per week   Allergies  Allergen Reactions  . Amiodarone Nausea And Vomiting  . Aspirin Swelling  . Flexeril [Cyclobenzaprine] Swelling  . Morphine And Related Swelling  . Trazamine [Trazodone & Diet Manage Prod] Nausea And Vomiting  . Codeine Rash  . Tramadol Rash   Prior to Admission medications   Medication Sig Start Date End Date Taking? Authorizing Provider  bisacodyl (DULCOLAX) 5 MG EC tablet Take 2 tablets (  10 mg total) by mouth daily as needed for moderate constipation. 07/21/18   Epifanio Lesches, MD  budesonide-formoterol (SYMBICORT) 160-4.5 MCG/ACT inhaler Inhale 2 puffs into the lungs 2 (two) times daily. 02/26/16   [provider]  bumetanide (BUMEX) 2 MG tablet Take 2 tablets (4 mg total) by mouth daily. 07/22/18   Epifanio Lesches, MD  diltiazem (CARDIZEM CD) 240 MG 24 hr capsule Take 1 capsule (240 mg total) by mouth daily. 04/26/18   Vaughan Basta, MD  ferrous sulfate 325 (65 FE) MG tablet Take 325 mg by mouth daily. 06/01/18    [provider]  gabapentin (NEURONTIN) 300 MG capsule Take 600 mg by mouth 3 (three) times daily.  07/22/15   [provider]  ipratropium-albuterol (DUONEB) 0.5-2.5 (3) MG/3ML SOLN Inhale 3 mLs into the lungs every 8 (eight) hours as needed (respiratory).  02/26/16 12/20/18  [provider]  lactulose (CHRONULAC) 10 GM/15ML solution Take 45 mLs (30 g total) by mouth 2 (two) times daily as needed for mild constipation. 07/21/18   Epifanio Lesches, MD  magnesium oxide (MAG-OX) 400 MG tablet Take 400 mg by mouth 2 (two) times daily. 07/11/18 07/11/19  [provider]  metoprolol tartrate (LOPRESSOR) 25 MG tablet Take 1 tablet (25 mg total) by mouth 2 (two) times daily. 07/21/18 07/21/19  Epifanio Lesches, MD  nystatin (MYCOSTATIN/NYSTOP) powder Apply topically 2 (two) times daily as needed (yeast). 07/21/18   Epifanio Lesches, MD  omeprazole (PRILOSEC) 40 MG capsule Take 40 mg by mouth 2 (two) times daily.    [provider]  oxyCODONE-acetaminophen (PERCOCET/ROXICET) 5-325 MG tablet Take 1-2 tablets by mouth every 6 (six) hours as needed for severe pain. 07/21/18   Epifanio Lesches, MD  promethazine (PHENERGAN) 12.5 MG tablet Take 1 tablet (12.5 mg total) by mouth every 6 (six) hours as needed for nausea or vomiting. 07/21/18   Epifanio Lesches, MD  rivaroxaban (XARELTO) 20 MG TABS tablet Take 20 mg by mouth daily. 07/11/18 07/11/19  [provider]    Review of Systems  Constitutional: Positive for fatigue (easily). Negative for appetite change.  HENT: Negative for congestion, postnasal drip and sore throat.   Eyes: Positive for visual disturbance (blurry vision at times).  Respiratory: Negative for cough and shortness of breath.   Cardiovascular: Positive for chest pain (pressure right upper chest). Negative for palpitations and leg swelling.  Gastrointestinal: Positive for nausea. Negative for abdominal distention and abdominal pain.   Endocrine: Negative.   Genitourinary: Negative.   Musculoskeletal: Negative for back pain and neck pain.  Skin: Negative.   Allergic/Immunologic: Negative.   Neurological: Positive for light-headedness.  Hematological: Negative for adenopathy. Does not bruise/bleed easily.  Psychiatric/Behavioral: Positive for sleep disturbance (due to pain). Negative for dysphoric mood. The patient is nervous/anxious.     Vitals:   07/26/18 0858  BP: 124/88  Pulse: 88  Resp: 18  SpO2: 97%  Weight: 267 lb (121.1 kg)  Height: 5\' 4"  (1.626 m)   Wt Readings from Last 3 Encounters:  07/26/18 267 lb (121.1 kg)  07/21/18 277 lb 14.4 oz (126.1 kg)  05/25/18 275 lb (124.7 kg)   Lab Results  Component Value Date   CREATININE 0.50 07/21/2018   CREATININE 0.57 07/20/2018   CREATININE 0.69 07/19/2018    Physical Exam  Constitutional: She is oriented to person, place, and time. She appears well-developed and well-nourished.  HENT:  Head: Normocephalic and atraumatic.  Neck: Normal range of motion. Neck supple. No JVD present.  Cardiovascular: Normal rate and regular rhythm.  Pulmonary/Chest: Effort normal. No respiratory distress. She has no wheezes. She has no rales.  Abdominal: Soft. She exhibits no distension.  Musculoskeletal:       Right lower leg: She exhibits no tenderness and no edema.       Left lower leg: She exhibits no tenderness and no edema.  Neurological: She is alert and oriented to person, place, and time.  Skin: Skin is warm and dry.  Psychiatric: She has a normal mood and affect. Her behavior is normal.  Nursing note and vitals reviewed.  Assessment & Plan:  1: Chronic heart failure with preserved ejection fraction- - NYHA class III - euvolemic today - weighing daily and she was instructed to call for an overnight weight gain of >2 pounds or a weekly weight gain of >5 pounds - not adding salt and has been reading food labels. Reviewed the importance of closely following a  2000mg  sodium diet and written dietary information was given to her about this - mostly drinking water during the day and an occasional soda - discussed rehab but will need to wait till 30 days after HF admission; had MV replaced July 2019 with extensive hospitalization - will decrease her bumex to 2mg  daily (was 4mg  daily). She can take an extra 2mg  for above weight gain parameters or for any edema - BNP 07/17/18 was 371.0 - PharmD reconciled medications with the patient  2: HTN- - BP looks good today  - saw PCP @ [facility] 06/14/18 - BMP 07/21/18 reviewed and showed sodium 139, potassium 3.8, creatinine 0.5 and GFR >60  3: Atrial fibrillation- - had mitral valve replaced July 2019  Patient did not bring her medications nor a list. Each medication was verbally reviewed with the patient and she was encouraged to bring the bottles to every visit to confirm accuracy of list.  Return in 1 month or sooner for any questions/problems before then.

## 2018-07-26 ENCOUNTER — Encounter: Payer: Self-pay | Admitting: Family

## 2018-07-26 ENCOUNTER — Ambulatory Visit: Payer: Medicaid Other | Attending: Family | Admitting: Family

## 2018-07-26 VITALS — BP 124/88 | HR 88 | Resp 18 | Ht 64.0 in | Wt 267.0 lb

## 2018-07-26 DIAGNOSIS — I509 Heart failure, unspecified: Secondary | ICD-10-CM | POA: Diagnosis not present

## 2018-07-26 DIAGNOSIS — I5032 Chronic diastolic (congestive) heart failure: Secondary | ICD-10-CM

## 2018-07-26 DIAGNOSIS — J449 Chronic obstructive pulmonary disease, unspecified: Secondary | ICD-10-CM | POA: Insufficient documentation

## 2018-07-26 DIAGNOSIS — M797 Fibromyalgia: Secondary | ICD-10-CM | POA: Diagnosis not present

## 2018-07-26 DIAGNOSIS — I2510 Atherosclerotic heart disease of native coronary artery without angina pectoris: Secondary | ICD-10-CM | POA: Insufficient documentation

## 2018-07-26 DIAGNOSIS — M1990 Unspecified osteoarthritis, unspecified site: Secondary | ICD-10-CM | POA: Diagnosis not present

## 2018-07-26 DIAGNOSIS — G4733 Obstructive sleep apnea (adult) (pediatric): Secondary | ICD-10-CM | POA: Diagnosis not present

## 2018-07-26 DIAGNOSIS — I4891 Unspecified atrial fibrillation: Secondary | ICD-10-CM | POA: Insufficient documentation

## 2018-07-26 DIAGNOSIS — Z87891 Personal history of nicotine dependence: Secondary | ICD-10-CM | POA: Diagnosis not present

## 2018-07-26 DIAGNOSIS — I272 Pulmonary hypertension, unspecified: Secondary | ICD-10-CM | POA: Diagnosis not present

## 2018-07-26 DIAGNOSIS — K219 Gastro-esophageal reflux disease without esophagitis: Secondary | ICD-10-CM | POA: Insufficient documentation

## 2018-07-26 DIAGNOSIS — Z79899 Other long term (current) drug therapy: Secondary | ICD-10-CM | POA: Insufficient documentation

## 2018-07-26 DIAGNOSIS — G4730 Sleep apnea, unspecified: Secondary | ICD-10-CM | POA: Diagnosis not present

## 2018-07-26 DIAGNOSIS — Z952 Presence of prosthetic heart valve: Secondary | ICD-10-CM | POA: Insufficient documentation

## 2018-07-26 DIAGNOSIS — Z8711 Personal history of peptic ulcer disease: Secondary | ICD-10-CM | POA: Diagnosis not present

## 2018-07-26 DIAGNOSIS — Z7901 Long term (current) use of anticoagulants: Secondary | ICD-10-CM | POA: Diagnosis not present

## 2018-07-26 DIAGNOSIS — I10 Essential (primary) hypertension: Secondary | ICD-10-CM

## 2018-07-26 DIAGNOSIS — F419 Anxiety disorder, unspecified: Secondary | ICD-10-CM | POA: Insufficient documentation

## 2018-07-26 DIAGNOSIS — I480 Paroxysmal atrial fibrillation: Secondary | ICD-10-CM

## 2018-07-26 DIAGNOSIS — I110 Hypertensive heart disease with heart failure: Secondary | ICD-10-CM | POA: Diagnosis not present

## 2018-07-26 NOTE — Patient Instructions (Addendum)
Continue weighing daily and call for an overnight weight gain of > 2 pounds or a weekly weight gain of >5 pounds.  Decrease bumex to 2mg  daily. Take an additional 2mg  if needed for above weight gain or swelling.

## 2018-07-27 ENCOUNTER — Telehealth: Payer: Self-pay

## 2018-07-27 NOTE — Telephone Encounter (Signed)
EMMI Follow-up: Noted on the report that the patient didn't know who to contact if there was a change in her condition and responded no to transportation follow-up.  I talked with Ms. Rucci briefly Tuesday afternoon but said she had a cab waiting to take her to the grocery store and could I call her again.  I told her I would.  I called again today but did not get an answer.

## 2018-08-24 ENCOUNTER — Ambulatory Visit: Payer: Medicaid Other | Admitting: Family

## 2018-08-25 ENCOUNTER — Encounter: Payer: Self-pay | Admitting: *Deleted

## 2018-08-25 ENCOUNTER — Encounter: Payer: Medicaid Other | Attending: Cardiovascular Disease | Admitting: *Deleted

## 2018-08-25 VITALS — Ht 66.5 in | Wt 258.6 lb

## 2018-08-25 DIAGNOSIS — Z952 Presence of prosthetic heart valve: Secondary | ICD-10-CM

## 2018-08-25 NOTE — Progress Notes (Signed)
Cardiac Individual Treatment Plan  Patient Details  Name: Chelsea Ramsey MRN: [id number] Date of Birth: [birth year] Referring Provider:     Cardiac Rehab from 08/25/2018 in [facility] Cardiac and Pulmonary Rehab  Referring Provider  [name]      Initial Encounter Date:    Cardiac Rehab from 08/25/2018 in [facility] Cardiac and Pulmonary Rehab  Date  08/25/18      Visit Diagnosis: S/P mitral valve replacement  Patient's Home Medications on Admission:  Current Outpatient Medications:  .  albuterol (PROVENTIL HFA;VENTOLIN HFA) 108 (90 Base) MCG/ACT inhaler, Inhale into the lungs., Disp: , Rfl:  .  bisacodyl (DULCOLAX) 5 MG EC tablet, Take 2 tablets (10 mg total) by mouth daily as needed for moderate constipation., Disp: 30 tablet, Rfl: 0 .  budesonide-formoterol (SYMBICORT) 160-4.5 MCG/ACT inhaler, Inhale 2 puffs into the lungs 2 (two) times daily., Disp: , Rfl:  .  bumetanide (BUMEX) 2 MG tablet, Take 2 tablets (4 mg total) by mouth daily. (Patient taking differently: Take 2 mg by mouth daily. ), Disp: 30 tablet, Rfl: 0 .  ferrous sulfate 325 (65 FE) MG tablet, Take 325 mg by mouth daily., Disp: , Rfl:  .  gabapentin (NEURONTIN) 300 MG capsule, Take 600 mg by mouth 3 (three) times daily. , Disp: , Rfl:  .  HYDROcodone-acetaminophen (NORCO) 10-325 MG tablet, Take 1 tablet by mouth daily as needed., Disp: , Rfl: 0 .  ipratropium (ATROVENT) 0.02 % nebulizer solution, Inhale into the lungs., Disp: , Rfl:  .  ipratropium-albuterol (DUONEB) 0.5-2.5 (3) MG/3ML SOLN, Inhale 3 mLs into the lungs every 8 (eight) hours as needed (respiratory). , Disp: , Rfl:  .  lidocaine (LIDODERM) 5 %, Place onto the skin., Disp: , Rfl:  .  magnesium oxide (MAG-OX) 400 MG tablet, Take 400 mg by mouth 2 (two) times daily., Disp: , Rfl:  .  meclizine (ANTIVERT) 12.5 MG tablet, Take by mouth., Disp: , Rfl:  .  metoprolol succinate (TOPROL-XL) 50 MG 24 hr tablet, Take by mouth., Disp: , Rfl:  .  omeprazole (PRILOSEC) 40 MG  capsule, Take 40 mg by mouth 2 (two) times daily., Disp: , Rfl:  .  rivaroxaban (XARELTO) 20 MG TABS tablet, Take 20 mg by mouth daily., Disp: , Rfl:  .  tiZANidine (ZANAFLEX) 4 MG tablet, Take by mouth., Disp: , Rfl:  .  topiramate (TOPAMAX) 25 MG tablet, Take by mouth., Disp: , Rfl:  .  diltiazem (CARDIZEM CD) 240 MG 24 hr capsule, Take 1 capsule (240 mg total) by mouth daily. (Patient not taking: Reported on 08/25/2018), Disp: 30 capsule, Rfl: 0 .  lactulose (CHRONULAC) 10 GM/15ML solution, Take 45 mLs (30 g total) by mouth 2 (two) times daily as needed for mild constipation. (Patient not taking: Reported on 08/25/2018), Disp: 240 mL, Rfl: 0 .  metoprolol tartrate (LOPRESSOR) 25 MG tablet, Take 1 tablet (25 mg total) by mouth 2 (two) times daily. (Patient not taking: Reported on 08/25/2018), Disp: 60 tablet, Rfl: 0 .  nystatin (MYCOSTATIN/NYSTOP) powder, Apply topically 2 (two) times daily as needed (yeast). (Patient not taking: Reported on 08/25/2018), Disp: 15 g, Rfl: 0 .  oxyCODONE-acetaminophen (PERCOCET/ROXICET) 5-325 MG tablet, Take 1-2 tablets by mouth every 6 (six) hours as needed for severe pain. (Patient not taking: Reported on 08/25/2018), Disp: 30 tablet, Rfl: 0 .  promethazine (PHENERGAN) 12.5 MG tablet, Take 1 tablet (12.5 mg total) by mouth every 6 (six) hours as needed for nausea or vomiting. (Patient not taking: Reported  on 08/25/2018), Disp: 12 tablet, Rfl: 0  Past Medical History: Past Medical History:  Diagnosis Date  . Allergy    seasonal  . Anxiety   . Arthritis    Right Knee  . Asthma   . CHF (congestive heart failure) ([place])   . COPD (chronic obstructive pulmonary disease) ([place])   . Coronary artery disease    Leaky heart valve  . Fibromyalgia   . GERD (gastroesophageal reflux disease)   . Hypertension   . Pneumonia   . PUD (peptic ulcer disease)   . Pulmonary HTN ([place])   . Rheumatic fever/heart disease   . Sleep apnea     Tobacco Use: Social History   Tobacco  Use  Smoking Status Former Smoker  . Packs/day: 0.50  . Types: Cigarettes  Smokeless Tobacco Never Used    Labs: Recent Review Flowsheet Data    Labs for ITP Cardiac and Pulmonary Rehab Latest Ref Rng & Units 06/03/2012 12/09/2013 04/03/2014 08/21/2014 12/20/2017   Cholestrol 0 - 200 mg/dL - 91 96 - -   LDLCALC mg/dL - 50 51 - -   HDL 35 - 70 mg/dL - 31(L) 32(A) - -   Trlycerides 40 - 160 mg/dL - 50 63 - -   Hemoglobin A1c - 4.5 - - 5.2 -   PHART 7.350 - 7.450 - - - - 7.39   PCO2ART 32.0 - 48.0 mmHg - - - - 41   HCO3 20.0 - 28.0 mmol/L - - - - 24.8   ACIDBASEDEF 0.0 - 2.0 mmol/L - - - - 0.2   O2SAT % - - - - 98.4       Exercise Target Goals: Exercise Program Goal: Individual exercise prescription set using results from initial 6 min walk test and THRR while considering  patient's activity barriers and safety.   Exercise Prescription Goal: Initial exercise prescription builds to 30-45 minutes a day of aerobic activity, 2-3 days per week.  Home exercise guidelines will be given to patient during program as part of exercise prescription that the participant will acknowledge.  Activity Barriers & Risk Stratification: Activity Barriers & Cardiac Risk Stratification - 08/25/18 1550      Activity Barriers & Cardiac Risk Stratification   Activity Barriers  Arthritis;Back Problems;Shortness of Breath;Joint Problems;Fibromyalgia    Cardiac Risk Stratification  Moderate       6 Minute Walk: 6 Minute Walk    Row Name 08/25/18 1500         6 Minute Walk   Phase  Initial     Distance  965 feet     Walk Time  6 minutes     # of Rest Breaks  0     MPH  1.83     METS  2.7     RPE  11     Perceived Dyspnea   2     VO2 Peak  9.46     Symptoms  Yes (comment)     Comments  back pain 6/10 / pulled 02 tank on 2 L     Resting HR  102 bpm     Resting BP  126/90     Resting Oxygen Saturation   98 %     Exercise Oxygen Saturation  during 6 min walk  93 %     Max Ex. HR  110 bpm     Max  Ex. BP  136/84     2 Minute Post BP  116/84  Interval Oxygen   Interval Oxygen?  Yes     1 Minute Oxygen Saturation %  93 %     1 Minute Liters of Oxygen  2 L     2 Minute Oxygen Saturation %  95 %     2 Minute Liters of Oxygen  2 L     3 Minute Liters of Oxygen  2 L     4 Minute Oxygen Saturation %  95 %     4 Minute Liters of Oxygen  2 L     5 Minute Liters of Oxygen  2 L     6 Minute Oxygen Saturation %  97 %     6 Minute Liters of Oxygen  2 L     2 Minute Post Oxygen Saturation %  97 %     2 Minute Post Liters of Oxygen  2 L        Oxygen Initial Assessment:   Oxygen Re-Evaluation:   Oxygen Discharge (Final Oxygen Re-Evaluation):   Initial Exercise Prescription: Initial Exercise Prescription - 08/25/18 1500      Date of Initial Exercise RX and Referring Provider   Date  08/25/18    Referring Provider  [name]      Oxygen   Oxygen  Continuous    Liters  2      Treadmill   MPH  1.8    Grade  1    Minutes  15    METs  2.63      NuStep   Level  2    SPM  80    Minutes  15    METs  2.6      Biostep-RELP   Level  2    SPM  50    Minutes  15    METs  2      Prescription Details   Frequency (times per week)  2    Duration  Progress to 30 minutes of continuous aerobic without signs/symptoms of physical distress      Intensity   THRR 40-80% of Max Heartrate  128-155    Ratings of Perceived Exertion  11-13    Perceived Dyspnea  0-4      Resistance Training   Training Prescription  Yes    Weight  3 lb    Reps  10-15       Perform Capillary Blood Glucose checks as needed.  Exercise Prescription Changes: Exercise Prescription Changes    Row Name 08/25/18 1500             Response to Exercise   Blood Pressure (Admit)  126/90       Blood Pressure (Exercise)  136/84       Blood Pressure (Exit)  116/84       Heart Rate (Admit)  102 bpm       Heart Rate (Exercise)  110 bpm       Heart Rate (Exit)  102 bpm       Oxygen Saturation (Admit)   96 %       Oxygen Saturation (Exercise)  93 %       Oxygen Saturation (Exit)  97 %       Rating of Perceived Exertion (Exercise)  11       Perceived Dyspnea (Exercise)  2       Symptoms  back pain       Comments  gout L toe       Duration  Progress to 30 minutes of  aerobic without signs/symptoms of physical distress          Exercise Comments:   Exercise Goals and Review: Exercise Goals    Row Name 08/25/18 1500             Exercise Goals   Increase Physical Activity  Yes       Intervention  Provide advice, education, support and counseling about physical activity/exercise needs.;Develop an individualized exercise prescription for aerobic and resistive training based on initial evaluation findings, risk stratification, comorbidities and participant's personal goals.       Expected Outcomes  Short Term: Attend rehab on a regular basis to increase amount of physical activity.;Long Term: Add in home exercise to make exercise part of routine and to increase amount of physical activity.;Long Term: Exercising regularly at least 3-5 days a week.       Increase Strength and Stamina  Yes       Intervention  Provide advice, education, support and counseling about physical activity/exercise needs.;Develop an individualized exercise prescription for aerobic and resistive training based on initial evaluation findings, risk stratification, comorbidities and participant's personal goals.       Expected Outcomes  Short Term: Increase workloads from initial exercise prescription for resistance, speed, and METs.;Short Term: Perform resistance training exercises routinely during rehab and add in resistance training at home;Long Term: Improve cardiorespiratory fitness, muscular endurance and strength as measured by increased METs and functional capacity (6MWT)       Able to understand and use rate of perceived exertion (RPE) scale  Yes       Intervention  Provide education and explanation on how to use  RPE scale       Expected Outcomes  Short Term: Able to use RPE daily in rehab to express subjective intensity level;Long Term:  Able to use RPE to guide intensity level when exercising independently       Able to understand and use Dyspnea scale  Yes       Intervention  Provide education and explanation on how to use Dyspnea scale       Expected Outcomes  Short Term: Able to use Dyspnea scale daily in rehab to express subjective sense of shortness of breath during exertion;Long Term: Able to use Dyspnea scale to guide intensity level when exercising independently       Knowledge and understanding of Target Heart Rate Range (THRR)  Yes       Intervention  Provide education and explanation of THRR including how the numbers were predicted and where they are located for reference       Expected Outcomes  Short Term: Able to state/look up THRR;Short Term: Able to use daily as guideline for intensity in rehab;Long Term: Able to use THRR to govern intensity when exercising independently       Able to check pulse independently  Yes       Intervention  Provide education and demonstration on how to check pulse in carotid and radial arteries.;Review the importance of being able to check your own pulse for safety during independent exercise       Expected Outcomes  Short Term: Able to explain why pulse checking is important during independent exercise;Long Term: Able to check pulse independently and accurately       Understanding of Exercise Prescription  Yes       Intervention  Provide education, explanation, and written materials on patient's individual exercise prescription  Expected Outcomes  Short Term: Able to explain program exercise prescription;Long Term: Able to explain home exercise prescription to exercise independently          Exercise Goals Re-Evaluation :   Discharge Exercise Prescription (Final Exercise Prescription Changes): Exercise Prescription Changes - 08/25/18 1500       Response to Exercise   Blood Pressure (Admit)  126/90    Blood Pressure (Exercise)  136/84    Blood Pressure (Exit)  116/84    Heart Rate (Admit)  102 bpm    Heart Rate (Exercise)  110 bpm    Heart Rate (Exit)  102 bpm    Oxygen Saturation (Admit)  96 %    Oxygen Saturation (Exercise)  93 %    Oxygen Saturation (Exit)  97 %    Rating of Perceived Exertion (Exercise)  11    Perceived Dyspnea (Exercise)  2    Symptoms  back pain    Comments  gout L toe    Duration  Progress to 30 minutes of  aerobic without signs/symptoms of physical distress       Nutrition:  Target Goals: Understanding of nutrition guidelines, daily intake of sodium <1565m, cholesterol <2069m calories 30% from fat and 7% or less from saturated fats, daily to have 5 or more servings of fruits and vegetables.  Biometrics: Pre Biometrics - 08/25/18 1459      Pre Biometrics   Height  5' 6.5" (1.689 m)    Weight  258 lb 9.6 oz (117.3 kg)    Waist Circumference  47 inches    Hip Circumference  54 inches    Waist to Hip Ratio  0.87 %    BMI (Calculated)  41.12    Single Leg Stand  30 seconds        Nutrition Therapy Plan and Nutrition Goals: Nutrition Therapy & Goals - 08/25/18 1548      Intervention Plan   Intervention  Prescribe, educate and counsel regarding individualized specific dietary modifications aiming towards targeted core components such as weight, hypertension, lipid management, diabetes, heart failure and other comorbidities.;Nutrition handout(s) given to patient.    Expected Outcomes  Short Term Goal: Understand basic principles of dietary content, such as calories, fat, sodium, cholesterol and nutrients.;Short Term Goal: A plan has been developed with personal nutrition goals set during dietitian appointment.;Long Term Goal: Adherence to prescribed nutrition plan.       Nutrition Assessments: Nutrition Assessments - 08/25/18 1549      MEDFICTS Scores   Pre Score  125       Nutrition  Goals Re-Evaluation:   Nutrition Goals Discharge (Final Nutrition Goals Re-Evaluation):   Psychosocial: Target Goals: Acknowledge presence or absence of significant depression and/or stress, maximize coping skills, provide positive support system. Participant is able to verbalize types and ability to use techniques and skills needed for reducing stress and depression.   Initial Review & Psychosocial Screening: Initial Psych Review & Screening - 08/25/18 1543      Initial Review   Current issues with  Current Sleep Concerns;Current Stress Concerns   has been sleeping on the couch since surgery (3 months ago) because of sternal discomfort. Wants to start sleeping in bed.    Source of Stress Concerns  Transportation;Unable to perform yard/household activities;Financial    Comments  Does not drive. Relies on family members to drive. Wants to get back to hobbies/doing house work/ building up stamina.       Family Dynamics   Good Support  System?  Yes   mother and aunt     Screening Interventions   Interventions  Encouraged to exercise;Program counselor consult;To provide support and resources with identified psychosocial needs;Provide feedback about the scores to participant    Expected Outcomes  Short Term goal: Utilizing psychosocial counselor, staff and physician to assist with identification of specific Stressors or current issues interfering with healing process. Setting desired goal for each stressor or current issue identified.;Long Term Goal: Stressors or current issues are controlled or eliminated.;Short Term goal: Identification and review with participant of any Quality of Life or Depression concerns found by scoring the questionnaire.;Long Term goal: The participant improves quality of Life and PHQ9 Scores as seen by post scores and/or verbalization of changes       Quality of Life Scores:  Quality of Life - 08/25/18 1545      Quality of Life   Select  Quality of Life       Quality of Life Scores   Health/Function Pre  16.4 %    Socioeconomic Pre  22.29 %    Psych/Spiritual Pre  18 %    Family Pre  27 %    GLOBAL Pre  19.27 %      Scores of 19 and below usually indicate a poorer quality of life in these areas.  A difference of  2-3 points is a clinically meaningful difference.  A difference of 2-3 points in the total score of the Quality of Life Index has been associated with significant improvement in overall quality of life, self-image, physical symptoms, and general health in studies assessing change in quality of life.  PHQ-9: Recent Review Flowsheet Data    Depression screen [facility] 2/9 08/25/2018 07/26/2018   Decreased Interest 1 0   Down, Depressed, Hopeless 0 0   PHQ - 2 Score 1 0   Altered sleeping 2 -   Tired, decreased energy 2 -   Change in appetite 1 -   Feeling bad or failure about yourself  0 -   Trouble concentrating 2 -   Moving slowly or fidgety/restless 0 -   Suicidal thoughts 0 -   PHQ-9 Score 8 -   Difficult doing work/chores Not difficult at all -     Interpretation of Total Score  Total Score Depression Severity:  1-4 = Minimal depression, 5-9 = Mild depression, 10-14 = Moderate depression, 15-19 = Moderately severe depression, 20-27 = Severe depression   Psychosocial Evaluation and Intervention:   Psychosocial Re-Evaluation:   Psychosocial Discharge (Final Psychosocial Re-Evaluation):   Vocational Rehabilitation: Provide vocational rehab assistance to qualifying candidates.   Vocational Rehab Evaluation & Intervention: Vocational Rehab - 08/25/18 1542      Initial Vocational Rehab Evaluation & Intervention   Assessment shows need for Vocational Rehabilitation  Yes    Vocational Rehab Packet given to patient  --   paperwork will be given first day of class      Education: Education Goals: Education classes will be provided on a variety of topics geared toward better understanding of heart health and risk factor  modification. Participant will state understanding/return demonstration of topics presented as noted by education test scores.  Learning Barriers/Preferences: Learning Barriers/Preferences - 08/25/18 1541      Learning Barriers/Preferences   Learning Barriers  None    Learning Preferences  Individual Instruction       Education Topics:  AED/CPR: - Group verbal and written instruction with the use of models to demonstrate the basic use of  the AED with the basic ABC's of resuscitation.   General Nutrition Guidelines/Fats and Fiber: -Group instruction provided by verbal, written material, models and posters to present the general guidelines for heart healthy nutrition. Gives an explanation and review of dietary fats and fiber.   Controlling Sodium/Reading Food Labels: -Group verbal and written material supporting the discussion of sodium use in heart healthy nutrition. Review and explanation with models, verbal and written materials for utilization of the food label.   Exercise Physiology & General Exercise Guidelines: - Group verbal and written instruction with models to review the exercise physiology of the cardiovascular system and associated critical values. Provides general exercise guidelines with specific guidelines to those with heart or lung disease.    Aerobic Exercise & Resistance Training: - Gives group verbal and written instruction on the various components of exercise. Focuses on aerobic and resistive training programs and the benefits of this training and how to safely progress through these programs..   Flexibility, Balance, Mind/Body Relaxation: Provides group verbal/written instruction on the benefits of flexibility and balance training, including mind/body exercise modes such as yoga, pilates and tai chi.  Demonstration and skill practice provided.   Stress and Anxiety: - Provides group verbal and written instruction about the health risks of elevated stress and  causes of high stress.  Discuss the correlation between heart/lung disease and anxiety and treatment options. Review healthy ways to manage with stress and anxiety.   Depression: - Provides group verbal and written instruction on the correlation between heart/lung disease and depressed mood, treatment options, and the stigmas associated with seeking treatment.   Anatomy & Physiology of the Heart: - Group verbal and written instruction and models provide basic cardiac anatomy and physiology, with the coronary electrical and arterial systems. Review of Valvular disease and Heart Failure   Cardiac Procedures: - Group verbal and written instruction to review commonly prescribed medications for heart disease. Reviews the medication, class of the drug, and side effects. Includes the steps to properly store meds and maintain the prescription regimen. (beta blockers and nitrates)   Cardiac Medications I: - Group verbal and written instruction to review commonly prescribed medications for heart disease. Reviews the medication, class of the drug, and side effects. Includes the steps to properly store meds and maintain the prescription regimen.   Cardiac Medications II: -Group verbal and written instruction to review commonly prescribed medications for heart disease. Reviews the medication, class of the drug, and side effects. (all other drug classes)    Go Sex-Intimacy & Heart Disease, Get SMART - Goal Setting: - Group verbal and written instruction through game format to discuss heart disease and the return to sexual intimacy. Provides group verbal and written material to discuss and apply goal setting through the application of the S.M.A.R.T. Method.   Other Matters of the Heart: - Provides group verbal, written materials and models to describe Stable Angina and Peripheral Artery. Includes description of the disease process and treatment options available to the cardiac patient.   Exercise &  Equipment Safety: - Individual verbal instruction and demonstration of equipment use and safety with use of the equipment.   Cardiac Rehab from 08/25/2018 in [facility] Cardiac and Pulmonary Rehab  Date  08/25/18  Educator  [facility]  Instruction Review Code  1- Verbalizes Understanding      Infection Prevention: - Provides verbal and written material to individual with discussion of infection control including proper hand washing and proper equipment cleaning during exercise session.   Cardiac Rehab  from 08/25/2018 in [facility] Cardiac and Pulmonary Rehab  Date  08/25/18  Educator  [facility]  Instruction Review Code  1- Verbalizes Understanding      Falls Prevention: - Provides verbal and written material to individual with discussion of falls prevention and safety.   Cardiac Rehab from 08/25/2018 in [facility] Cardiac and Pulmonary Rehab  Date  08/25/18  Educator  [facility]  Instruction Review Code  1- Verbalizes Understanding      Diabetes: - Individual verbal and written instruction to review signs/symptoms of diabetes, desired ranges of glucose level fasting, after meals and with exercise. Acknowledge that pre and post exercise glucose checks will be done for 3 sessions at entry of program.   Know Your Numbers and Risk Factors: -Group verbal and written instruction about important numbers in your health.  Discussion of what are risk factors and how they play a role in the disease process.  Review of Cholesterol, Blood Pressure, Diabetes, and BMI and the role they play in your overall health.   Sleep Hygiene: -Provides group verbal and written instruction about how sleep can affect your health.  Define sleep hygiene, discuss sleep cycles and impact of sleep habits. Review good sleep hygiene tips.    Other: -Provides group and verbal instruction on various topics (see comments)   Knowledge Questionnaire Score: Knowledge Questionnaire Score - 08/25/18 1541      Knowledge Questionnaire Score   Pre Score   19/26   correct answers reviewed with [place], focus on exercise, nutrition, and MI      Core Components/Risk Factors/Patient Goals at Admission: Personal Goals and Risk Factors at Admission - 08/25/18 1539      Core Components/Risk Factors/Patient Goals on Admission    Weight Management  Yes;Obesity;Weight Loss    Intervention  Weight Management: Develop a combined nutrition and exercise program designed to reach desired caloric intake, while maintaining appropriate intake of nutrient and fiber, sodium and fats, and appropriate energy expenditure required for the weight goal.;Weight Management: Provide education and appropriate resources to help participant work on and attain dietary goals.;Weight Management/Obesity: Establish reasonable short term and long term weight goals.;Obesity: Provide education and appropriate resources to help participant work on and attain dietary goals.    Admit Weight  258 lb 9.6 oz (117.3 kg)    Goal Weight: Short Term  253 lb (114.8 kg)    Goal Weight: Long Term  200 lb (90.7 kg)    Expected Outcomes  Short Term: Continue to assess and modify interventions until short term weight is achieved;Long Term: Adherence to nutrition and physical activity/exercise program aimed toward attainment of established weight goal;Weight Loss: Understanding of general recommendations for a balanced deficit meal plan, which promotes 1-2 lb weight loss per week and includes a negative energy balance of [phone] kcal/d;Understanding recommendations for meals to include 15-35% energy as protein, 25-35% energy from fat, 35-60% energy from carbohydrates, less than 247m of dietary cholesterol, 20-35 gm of total fiber daily;Understanding of distribution of calorie intake throughout the day with the consumption of 4-5 meals/snacks    Tobacco Cessation  Yes    Intervention  Assist the participant in steps to quit. Provide individualized education and counseling about committing to Tobacco Cessation,  relapse prevention, and pharmacological support that can be provided by physician.;OAdvice worker assist with locating and accessing local/national Quit Smoking programs, and support quit date choice.    Expected Outcomes  Short Term: Will demonstrate readiness to quit, by selecting a quit date.;Short Term: Will quit  all tobacco product use, adhering to prevention of relapse plan.;Long Term: Complete abstinence from all tobacco products for at least 12 months from quit date.    Heart Failure  Yes    Intervention  Provide a combined exercise and nutrition program that is supplemented with education, support and counseling about heart failure. Directed toward relieving symptoms such as shortness of breath, decreased exercise tolerance, and extremity edema.    Expected Outcomes  Improve functional capacity of life;Short term: Attendance in program 2-3 days a week with increased exercise capacity. Reported lower sodium intake. Reported increased fruit and vegetable intake. Reports medication compliance.;Short term: Daily weights obtained and reported for increase. Utilizing diuretic protocols set by physician.;Long term: Adoption of self-care skills and reduction of barriers for early signs and symptoms recognition and intervention leading to self-care maintenance.    Hypertension  Yes    Intervention  Provide education on lifestyle modifcations including regular physical activity/exercise, weight management, moderate sodium restriction and increased consumption of fresh fruit, vegetables, and low fat dairy, alcohol moderation, and smoking cessation.;Monitor prescription use compliance.    Expected Outcomes  Short Term: Continued assessment and intervention until BP is < 140/70m HG in hypertensive participants. < 130/871mHG in hypertensive participants with diabetes, heart failure or chronic kidney disease.;Long Term: Maintenance of blood pressure at goal levels.       Core Components/Risk  Factors/Patient Goals Review:    Core Components/Risk Factors/Patient Goals at Discharge (Final Review):    ITP Comments: ITP Comments    Row Name 08/25/18 1531           ITP Comments  Med Review completed. Initial ITP created. [facility]          Comments: Initial ITP

## 2018-08-25 NOTE — Patient Instructions (Signed)
Patient Instructions  Patient Details  Name: Chelsea Ramsey MRN: [id number] Date of Birth: [birth year] Referring Provider:  Sandria Bales, MD  Below are your personal goals for exercise, nutrition, and risk factors. Our goal is to help you stay on track towards obtaining and maintaining these goals. We will be discussing your progress on these goals with you throughout the program.  Initial Exercise Prescription: Initial Exercise Prescription - 08/25/18 1500      Date of Initial Exercise RX and Referring Provider   Date  08/25/18    Referring Provider  [name]      Oxygen   Oxygen  Continuous    Liters  2      Treadmill   MPH  1.8    Grade  1    Minutes  15    METs  2.63      NuStep   Level  2    SPM  80    Minutes  15    METs  2.6      Biostep-RELP   Level  2    SPM  50    Minutes  15    METs  2      Prescription Details   Frequency (times per week)  2    Duration  Progress to 30 minutes of continuous aerobic without signs/symptoms of physical distress      Intensity   THRR 40-80% of Max Heartrate  128-155    Ratings of Perceived Exertion  11-13    Perceived Dyspnea  0-4      Resistance Training   Training Prescription  Yes    Weight  3 lb    Reps  10-15       Exercise Goals: Frequency: Be able to perform aerobic exercise two to three times per week in program working toward 2-5 days per week of home exercise.  Intensity: Work with a perceived exertion of 11 (fairly light) - 15 (hard) while following your exercise prescription.  We will make changes to your prescription with you as you progress through the program.   Duration: Be able to do 30 to 45 minutes of continuous aerobic exercise in addition to a 5 minute warm-up and a 5 minute cool-down routine.   Nutrition Goals: Your personal nutrition goals will be established when you do your nutrition analysis with the dietician.  The following are general nutrition guidelines to follow: Cholesterol <  200mg /day Sodium < 1500mg /day Fiber: Women over 50 yrs - 21 grams per day  Personal Goals: Personal Goals and Risk Factors at Admission - 08/25/18 1539      Core Components/Risk Factors/Patient Goals on Admission    Weight Management  Yes;Obesity;Weight Loss    Intervention  Weight Management: Develop a combined nutrition and exercise program designed to reach desired caloric intake, while maintaining appropriate intake of nutrient and fiber, sodium and fats, and appropriate energy expenditure required for the weight goal.;Weight Management: Provide education and appropriate resources to help participant work on and attain dietary goals.;Weight Management/Obesity: Establish reasonable short term and long term weight goals.;Obesity: Provide education and appropriate resources to help participant work on and attain dietary goals.    Admit Weight  258 lb 9.6 oz (117.3 kg)    Goal Weight: Short Term  253 lb (114.8 kg)    Goal Weight: Long Term  200 lb (90.7 kg)    Expected Outcomes  Short Term: Continue to assess and modify interventions until short term weight is achieved;Long  Term: Adherence to nutrition and physical activity/exercise program aimed toward attainment of established weight goal;Weight Loss: Understanding of general recommendations for a balanced deficit meal plan, which promotes 1-2 lb weight loss per week and includes a negative energy balance of [phone] kcal/d;Understanding recommendations for meals to include 15-35% energy as protein, 25-35% energy from fat, 35-60% energy from carbohydrates, less than 200mg  of dietary cholesterol, 20-35 gm of total fiber daily;Understanding of distribution of calorie intake throughout the day with the consumption of 4-5 meals/snacks    Tobacco Cessation  Yes    Intervention  Assist the participant in steps to quit. Provide individualized education and counseling about committing to Tobacco Cessation, relapse prevention, and pharmacological support  that can be provided by physician.;Advice worker, assist with locating and accessing local/national Quit Smoking programs, and support quit date choice.    Expected Outcomes  Short Term: Will demonstrate readiness to quit, by selecting a quit date.;Short Term: Will quit all tobacco product use, adhering to prevention of relapse plan.;Long Term: Complete abstinence from all tobacco products for at least 12 months from quit date.    Heart Failure  Yes    Intervention  Provide a combined exercise and nutrition program that is supplemented with education, support and counseling about heart failure. Directed toward relieving symptoms such as shortness of breath, decreased exercise tolerance, and extremity edema.    Expected Outcomes  Improve functional capacity of life;Short term: Attendance in program 2-3 days a week with increased exercise capacity. Reported lower sodium intake. Reported increased fruit and vegetable intake. Reports medication compliance.;Short term: Daily weights obtained and reported for increase. Utilizing diuretic protocols set by physician.;Long term: Adoption of self-care skills and reduction of barriers for early signs and symptoms recognition and intervention leading to self-care maintenance.    Hypertension  Yes    Intervention  Provide education on lifestyle modifcations including regular physical activity/exercise, weight management, moderate sodium restriction and increased consumption of fresh fruit, vegetables, and low fat dairy, alcohol moderation, and smoking cessation.;Monitor prescription use compliance.    Expected Outcomes  Short Term: Continued assessment and intervention until BP is < 140/52mm HG in hypertensive participants. < 130/63mm HG in hypertensive participants with diabetes, heart failure or chronic kidney disease.;Long Term: Maintenance of blood pressure at goal levels.       Tobacco Use Initial Evaluation: Social History   Tobacco Use   Smoking Status Former Smoker  . Packs/day: 0.50  . Types: Cigarettes  Smokeless Tobacco Never Used    Exercise Goals and Review: Exercise Goals    Row Name 08/25/18 1500             Exercise Goals   Increase Physical Activity  Yes       Intervention  Provide advice, education, support and counseling about physical activity/exercise needs.;Develop an individualized exercise prescription for aerobic and resistive training based on initial evaluation findings, risk stratification, comorbidities and participant's personal goals.       Expected Outcomes  Short Term: Attend rehab on a regular basis to increase amount of physical activity.;Long Term: Add in home exercise to make exercise part of routine and to increase amount of physical activity.;Long Term: Exercising regularly at least 3-5 days a week.       Increase Strength and Stamina  Yes       Intervention  Provide advice, education, support and counseling about physical activity/exercise needs.;Develop an individualized exercise prescription for aerobic and resistive training based on initial evaluation findings, risk stratification, comorbidities  and participant's personal goals.       Expected Outcomes  Short Term: Increase workloads from initial exercise prescription for resistance, speed, and METs.;Short Term: Perform resistance training exercises routinely during rehab and add in resistance training at home;Long Term: Improve cardiorespiratory fitness, muscular endurance and strength as measured by increased METs and functional capacity (6MWT)       Able to understand and use rate of perceived exertion (RPE) scale  Yes       Intervention  Provide education and explanation on how to use RPE scale       Expected Outcomes  Short Term: Able to use RPE daily in rehab to express subjective intensity level;Long Term:  Able to use RPE to guide intensity level when exercising independently       Able to understand and use Dyspnea scale  Yes        Intervention  Provide education and explanation on how to use Dyspnea scale       Expected Outcomes  Short Term: Able to use Dyspnea scale daily in rehab to express subjective sense of shortness of breath during exertion;Long Term: Able to use Dyspnea scale to guide intensity level when exercising independently       Knowledge and understanding of Target Heart Rate Range (THRR)  Yes       Intervention  Provide education and explanation of THRR including how the numbers were predicted and where they are located for reference       Expected Outcomes  Short Term: Able to state/look up THRR;Short Term: Able to use daily as guideline for intensity in rehab;Long Term: Able to use THRR to govern intensity when exercising independently       Able to check pulse independently  Yes       Intervention  Provide education and demonstration on how to check pulse in carotid and radial arteries.;Review the importance of being able to check your own pulse for safety during independent exercise       Expected Outcomes  Short Term: Able to explain why pulse checking is important during independent exercise;Long Term: Able to check pulse independently and accurately       Understanding of Exercise Prescription  Yes       Intervention  Provide education, explanation, and written materials on patient's individual exercise prescription       Expected Outcomes  Short Term: Able to explain program exercise prescription;Long Term: Able to explain home exercise prescription to exercise independently          Copy of goals given to participant.

## 2018-08-25 NOTE — Progress Notes (Signed)
Daily Session Note  Patient Details  Name: Chelsea Ramsey MRN: [id number] Date of Birth: 1966/04/01 Referring Provider:     Cardiac Rehab from 08/25/2018 in [facility]iac [facility]  Referring Provider  [name]      Encounter Date: 08/25/2018  Check In: Session Check In - 08/25/18 1530      Check-In   Supervising physician immediately available to respond to emergencies  See telemetry face sheet for immediately available ER MD    Location  [facility]-Cardiac & Pulmonary Rehab    Staff Present  Renita Papa, RN BSN;Leslie Castrejon RN, Vickki Hearing, BA, ACSM CEP, Exercise Physiologist    Tobacco Cessation  No Change   4-5 cigs a day   Warm-up and Cool-down  Not performed (comment)   med review   Resistance Training Performed  Yes    VAD Patient?  No    PAD/SET Patient?  No      Pain Assessment   Currently in Pain?  No/denies        Exercise Prescription Changes - 08/25/18 1500      Response to Exercise   Blood Pressure (Admit)  126/90    Blood Pressure (Exercise)  136/84    Blood Pressure (Exit)  116/84    Heart Rate (Admit)  102 bpm    Heart Rate (Exercise)  110 bpm    Heart Rate (Exit)  102 bpm    Oxygen Saturation (Admit)  96 %    Oxygen Saturation (Exercise)  93 %    Oxygen Saturation (Exit)  97 %    Rating of Perceived Exertion (Exercise)  11    Perceived Dyspnea (Exercise)  2    Symptoms  back pain    Comments  gout L toe    Duration  Progress to 30 minutes of  aerobic without signs/symptoms of physical distress       Social History   Tobacco Use  Smoking Status Former Smoker  . Packs/day: 0.50  . Types: Cigarettes  Smokeless Tobacco Never Used    Goals Met:  Proper associated with RPD/PD & O2 Sat Exercise tolerated well Personal goals reviewed No report of cardiac concerns or symptoms Strength training completed today  Goals Unmet:  Not Applicable  Comments: Med Review completed   [name] is Medical Director for  [facility].

## 2018-08-30 NOTE — Progress Notes (Deleted)
Patient ID: Chelsea Ramsey, female    DOB: [birth year], 52 y.o.   MRN: [id number]  HPI  Chelsea Ramsey is a 52 y/o female with a history of CAD (leaky valve), HTN, pulmonary HTN, COPD, GERD, obstructive sleep apnea, previous tobacco use and chronic heart failure.   Echo report from 07/20/18 reviewed and showed an EF of 50-55% along with mild MR and an elevated PA pressure of 50 mm Hg.   Admitted 08/04/18 due to intractable headache along with dizziness. Medications adjusted and she was discharged after 6 days. Admitted 07/17/18 due to acute on chronic heart failure. Initially given IV diuretics and then transitioned to oral diuretics. Cardiology consult obtained. Discharged after 4 days. Admitted 06/20/18 due to having her mitral valve replaced. Dobutamine and epinephrine were weaned and she was extubated. Had atrial fibrillation exacerbation which involved medication adjustments. Had cardioversion done 06/30/18 with resultant short time in NSR but then reverted back to atrial fibrillation. Unable to tolerate amiodarone due to nausea and vomiting. Discharged after 22 days.   She presents today for a follow-up visit with a chief complaint of    Past Medical History:  Diagnosis Date  . Allergy    seasonal  . Anxiety   . Arthritis    Right Knee  . Asthma   . CHF (congestive heart failure) ([place])   . COPD (chronic obstructive pulmonary disease) ([place])   . Coronary artery disease    Leaky heart valve  . Fibromyalgia   . GERD (gastroesophageal reflux disease)   . Hypertension   . Pneumonia   . PUD (peptic ulcer disease)   . Pulmonary HTN ([place])   . Rheumatic fever/heart disease   . Sleep apnea    Past Surgical History:  Procedure Laterality Date  . ADENOIDECTOMY    . ESOPHAGOGASTRODUODENOSCOPY (EGD) WITH PROPOFOL N/A 05/25/2018   Procedure: ESOPHAGOGASTRODUODENOSCOPY (EGD) WITH PROPOFOL;  Surgeon: Lucilla Lame, MD;  [facility] ENDOSCOPY;  Service: Endoscopy;  Laterality: N/A;  . MITRAL VALVE  REPLACEMENT    . MULTIPLE TOOTH EXTRACTIONS    . TONSILLECTOMY    . TOTAL KNEE ARTHROPLASTY Right 09/04/2016   Procedure: TOTAL KNEE ARTHROPLASTY; with lateral release;  Surgeon: Earlie Server, MD;  Location: [place];  Service: Orthopedics;  Laterality: Right;   Family History  Problem Relation Age of Onset  . COPD Mother   . Cancer Mother        Bone  . Asthma Mother   . Congestive Heart Failure Father    Social History   Tobacco Use  . Smoking status: Former Smoker    Packs/day: 0.50    Types: Cigarettes  . Smokeless tobacco: Never Used  Substance Use Topics  . Alcohol use: Not Currently    Alcohol/week: 3.0 standard drinks    Types: 3 Cans of beer per week    Comment: 16 oz per week   Allergies  Allergen Reactions  . Amiodarone Nausea And Vomiting  . Aspirin Swelling  . Flexeril [Cyclobenzaprine] Swelling  . Morphine And Related Swelling  . Trazamine [Trazodone & Diet Manage Prod] Nausea And Vomiting  . Codeine Rash  . Tramadol Rash     Review of Systems  Constitutional: Positive for fatigue (easily). Negative for appetite change.  HENT: Negative for congestion, postnasal drip and sore throat.   Eyes: Positive for visual disturbance (blurry vision at times).  Respiratory: Negative for cough and shortness of breath.   Cardiovascular: Positive for chest pain (pressure right upper chest). Negative for  palpitations and leg swelling.  Gastrointestinal: Positive for nausea. Negative for abdominal distention and abdominal pain.  Endocrine: Negative.   Genitourinary: Negative.   Musculoskeletal: Negative for back pain and neck pain.  Skin: Negative.   Allergic/Immunologic: Negative.   Neurological: Positive for light-headedness.  Hematological: Negative for adenopathy. Does not bruise/bleed easily.  Psychiatric/Behavioral: Positive for sleep disturbance (due to pain). Negative for dysphoric mood. The patient is nervous/anxious.       Physical Exam  Constitutional:  She is oriented to person, place, and time. She appears well-developed and well-nourished.  HENT:  Head: Normocephalic and atraumatic.  Neck: Normal range of motion. Neck supple. No JVD present.  Cardiovascular: Normal rate and regular rhythm.  Pulmonary/Chest: Effort normal. No respiratory distress. She has no wheezes. She has no rales.  Abdominal: Soft. She exhibits no distension.  Musculoskeletal:       Right lower leg: She exhibits no tenderness and no edema.       Left lower leg: She exhibits no tenderness and no edema.  Neurological: She is alert and oriented to person, place, and time.  Skin: Skin is warm and dry.  Psychiatric: She has a normal mood and affect. Her behavior is normal.  Nursing note and vitals reviewed.  Assessment & Plan:  1: Chronic heart failure with preserved ejection fraction- - NYHA class III - euvolemic today - weighing daily and she was instructed to call for an overnight weight gain of >2 pounds or a weekly weight gain of >5 pounds - weight  - not adding salt and has been reading food labels. Reviewed the importance of closely following a 2000mg  sodium diet and written dietary information was given to her about this - mostly drinking water during the day and an occasional soda - discussed rehab but will need to wait till 30 days after HF admission; had MV replaced July 2019 with extensive hospitalization -  - BNP 07/17/18 was 371.0 - PharmD reconciled medications with the patient  2: HTN- - BP  - saw PCP @ [facility] 06/14/18 - BMP 07/21/18 reviewed and showed sodium 139, potassium 3.8, creatinine 0.5 and GFR >60  3: Atrial fibrillation- - had mitral valve replaced July 2019  Patient did not bring her medications nor a list. Each medication was verbally reviewed with the patient and she was encouraged to bring the bottles to every visit to confirm accuracy of list.

## 2018-08-31 ENCOUNTER — Ambulatory Visit: Payer: Medicaid Other | Admitting: Family

## 2018-09-01 ENCOUNTER — Encounter: Payer: Medicaid Other | Attending: Cardiovascular Disease | Admitting: *Deleted

## 2018-09-01 DIAGNOSIS — R269 Unspecified abnormalities of gait and mobility: Secondary | ICD-10-CM | POA: Insufficient documentation

## 2018-09-01 DIAGNOSIS — I050 Rheumatic mitral stenosis: Secondary | ICD-10-CM | POA: Insufficient documentation

## 2018-09-01 DIAGNOSIS — I051 Rheumatic mitral insufficiency: Secondary | ICD-10-CM | POA: Insufficient documentation

## 2018-09-04 NOTE — Progress Notes (Deleted)
Patient ID: Chelsea Ramsey, female    DOB: November 13, 1966, 52 y.o.   MRN: [id number]  HPI  Chelsea Ramsey is a 52 y/o female with a history of CAD (leaky valve), HTN, pulmonary HTN, COPD, GERD, obstructive sleep apnea, previous tobacco use and chronic heart failure.   Echo report from 07/20/18 reviewed and showed an EF of 50-55% along with mild MR and an elevated PA pressure of 50 mm Hg.   Admitted 08/04/18 due to intractable headache along with dizziness. Medications adjusted and she was discharged after 6 days. Admitted 07/17/18 due to acute on chronic heart failure. Initially given IV diuretics and then transitioned to oral diuretics. Cardiology consult obtained. Discharged after 4 days. Admitted 06/20/18 due to having her mitral valve replaced. Dobutamine and epinephrine were weaned and she was extubated. Had atrial fibrillation exacerbation which involved medication adjustments. Had cardioversion done 06/30/18 with resultant short time in NSR but then reverted back to atrial fibrillation. Unable to tolerate amiodarone due to nausea and vomiting. Discharged after 22 days.   She presents today for a follow-up visit with a chief complaint of    Past Medical History:  Diagnosis Date  . Allergy    seasonal  . Anxiety   . Arthritis    Right Knee  . Asthma   . CHF (congestive heart failure) ([place])   . COPD (chronic obstructive pulmonary disease) ([place])   . Coronary artery disease    Leaky heart valve  . Fibromyalgia   . GERD (gastroesophageal reflux disease)   . Hypertension   . Pneumonia   . PUD (peptic ulcer disease)   . Pulmonary HTN ([place])   . Rheumatic fever/heart disease   . Sleep apnea    Past Surgical History:  Procedure Laterality Date  . ADENOIDECTOMY    . ESOPHAGOGASTRODUODENOSCOPY (EGD) WITH PROPOFOL N/A 05/25/2018   Procedure: ESOPHAGOGASTRODUODENOSCOPY (EGD) WITH PROPOFOL;  Surgeon: Lucilla Lame, MD;  Location: [facility] ENDOSCOPY;  Service: Endoscopy;  Laterality: N/A;  . MITRAL VALVE  REPLACEMENT    . MULTIPLE TOOTH EXTRACTIONS    . TONSILLECTOMY    . TOTAL KNEE ARTHROPLASTY Right 09/04/2016   Procedure: TOTAL KNEE ARTHROPLASTY; with lateral release;  Surgeon: Earlie Server, MD;  Location: [place];  Service: Orthopedics;  Laterality: Right;   Family History  Problem Relation Age of Onset  . COPD Mother   . Cancer Mother        Bone  . Asthma Mother   . Congestive Heart Failure Father    Social History   Tobacco Use  . Smoking status: Former Smoker    Packs/day: 0.50    Types: Cigarettes  . Smokeless tobacco: Never Used  Substance Use Topics  . Alcohol use: Not Currently    Alcohol/week: 3.0 standard drinks    Types: 3 Cans of beer per week    Comment: 16 oz per week   Allergies  Allergen Reactions  . Amiodarone Nausea And Vomiting  . Aspirin Swelling  . Flexeril [Cyclobenzaprine] Swelling  . Morphine And Related Swelling  . Trazamine [Trazodone & Diet Manage Prod] Nausea And Vomiting  . Codeine Rash  . Tramadol Rash     Review of Systems  Constitutional: Positive for fatigue (easily). Negative for appetite change.  HENT: Negative for congestion, postnasal drip and sore throat.   Eyes: Positive for visual disturbance (blurry vision at times).  Respiratory: Negative for cough and shortness of breath.   Cardiovascular: Positive for chest pain (pressure right upper chest). Negative for  palpitations and leg swelling.  Gastrointestinal: Positive for nausea. Negative for abdominal distention and abdominal pain.  Endocrine: Negative.   Genitourinary: Negative.   Musculoskeletal: Negative for back pain and neck pain.  Skin: Negative.   Allergic/Immunologic: Negative.   Neurological: Positive for light-headedness.  Hematological: Negative for adenopathy. Does not bruise/bleed easily.  Psychiatric/Behavioral: Positive for sleep disturbance (due to pain). Negative for dysphoric mood. The patient is nervous/anxious.       Physical Exam  Constitutional:  She is oriented to person, place, and time. She appears well-developed and well-nourished.  HENT:  Head: Normocephalic and atraumatic.  Neck: Normal range of motion. Neck supple. No JVD present.  Cardiovascular: Normal rate and regular rhythm.  Pulmonary/Chest: Effort normal. No respiratory distress. She has no wheezes. She has no rales.  Abdominal: Soft. She exhibits no distension.  Musculoskeletal:       Right lower leg: She exhibits no tenderness and no edema.       Left lower leg: She exhibits no tenderness and no edema.  Neurological: She is alert and oriented to person, place, and time.  Skin: Skin is warm and dry.  Psychiatric: She has a normal mood and affect. Her behavior is normal.  Nursing note and vitals reviewed.  Assessment & Plan:  1: Chronic heart failure with preserved ejection fraction- - NYHA class III - euvolemic today - weighing daily and she was instructed to call for an overnight weight gain of >2 pounds or a weekly weight gain of >5 pounds - weight  - not adding salt and has been reading food labels. Reviewed the importance of closely following a 2000mg  sodium diet and written dietary information was given to her about this - mostly drinking water during the day and an occasional soda - discussed rehab but will need to wait till 30 days after HF admission; had MV replaced July 2019 with extensive hospitalization -  - BNP 07/17/18 was 371.0 - PharmD reconciled medications with the patient  2: HTN- - BP  - [facility] PCP [facility] 06/14/18 - BMP 07/21/18 reviewed and showed sodium 139, potassium 3.8, creatinine 0.5 and GFR >60  3: Atrial fibrillation- - had mitral valve replaced July 2019  Patient did not bring her medications nor a list. Each medication was verbally reviewed with the patient and she was encouraged to bring the bottles to every visit to confirm accuracy of list.

## 2018-09-05 ENCOUNTER — Telehealth: Payer: Self-pay | Admitting: Family

## 2018-09-05 ENCOUNTER — Ambulatory Visit: Payer: Medicaid Other | Admitting: Family

## 2018-09-05 NOTE — Telephone Encounter (Signed)
Patient did not show for her [facility] appointment on 09/05/18. Will attempt to reschedule.  

## 2018-09-07 ENCOUNTER — Encounter: Payer: Self-pay | Admitting: *Deleted

## 2018-09-07 DIAGNOSIS — Z952 Presence of prosthetic heart valve: Secondary | ICD-10-CM

## 2018-09-07 NOTE — Progress Notes (Signed)
Cardiac Individual Treatment Plan  Patient Details  Name: BLANKA ROCKHOLT MRN: [id number] Date of Birth: 1966/03/23 Referring Provider:     Cardiac Rehab from 08/25/2018 in [facility] Cardiac and Pulmonary Rehab  Referring Provider  [name]      Initial Encounter Date:    Cardiac Rehab from 08/25/2018 in [facility] Cardiac and Pulmonary Rehab  Date  08/25/18      Visit Diagnosis: S/P mitral valve replacement  Patient's Home Medications on Admission:  Current Outpatient Medications:  .  albuterol (PROVENTIL HFA;VENTOLIN HFA) 108 (90 Base) MCG/ACT inhaler, Inhale into the lungs., Disp: , Rfl:  .  bisacodyl (DULCOLAX) 5 MG EC tablet, Take 2 tablets (10 mg total) by mouth daily as needed for moderate constipation., Disp: 30 tablet, Rfl: 0 .  budesonide-formoterol (SYMBICORT) 160-4.5 MCG/ACT inhaler, Inhale 2 puffs into the lungs 2 (two) times daily., Disp: , Rfl:  .  bumetanide (BUMEX) 2 MG tablet, Take 2 tablets (4 mg total) by mouth daily. (Patient taking differently: Take 2 mg by mouth daily. ), Disp: 30 tablet, Rfl: 0 .  diltiazem (CARDIZEM CD) 240 MG 24 hr capsule, Take 1 capsule (240 mg total) by mouth daily. (Patient not taking: Reported on 08/25/2018), Disp: 30 capsule, Rfl: 0 .  ferrous sulfate 325 (65 FE) MG tablet, Take 325 mg by mouth daily., Disp: , Rfl:  .  gabapentin (NEURONTIN) 300 MG capsule, Take 600 mg by mouth 3 (three) times daily. , Disp: , Rfl:  .  HYDROcodone-acetaminophen (NORCO) 10-325 MG tablet, Take 1 tablet by mouth daily as needed., Disp: , Rfl: 0 .  ipratropium (ATROVENT) 0.02 % nebulizer solution, Inhale into the lungs., Disp: , Rfl:  .  ipratropium-albuterol (DUONEB) 0.5-2.5 (3) MG/3ML SOLN, Inhale 3 mLs into the lungs every 8 (eight) hours as needed (respiratory). , Disp: , Rfl:  .  lactulose (CHRONULAC) 10 GM/15ML solution, Take 45 mLs (30 g total) by mouth 2 (two) times daily as needed for mild constipation. (Patient not taking: Reported on 08/25/2018), Disp: 240 mL,  Rfl: 0 .  lidocaine (LIDODERM) 5 %, Place onto the skin., Disp: , Rfl:  .  magnesium oxide (MAG-OX) 400 MG tablet, Take 400 mg by mouth 2 (two) times daily., Disp: , Rfl:  .  meclizine (ANTIVERT) 12.5 MG tablet, Take by mouth., Disp: , Rfl:  .  metoprolol succinate (TOPROL-XL) 50 MG 24 hr tablet, Take by mouth., Disp: , Rfl:  .  metoprolol tartrate (LOPRESSOR) 25 MG tablet, Take 1 tablet (25 mg total) by mouth 2 (two) times daily. (Patient not taking: Reported on 08/25/2018), Disp: 60 tablet, Rfl: 0 .  nystatin (MYCOSTATIN/NYSTOP) powder, Apply topically 2 (two) times daily as needed (yeast). (Patient not taking: Reported on 08/25/2018), Disp: 15 g, Rfl: 0 .  omeprazole (PRILOSEC) 40 MG capsule, Take 40 mg by mouth 2 (two) times daily., Disp: , Rfl:  .  oxyCODONE-acetaminophen (PERCOCET/ROXICET) 5-325 MG tablet, Take 1-2 tablets by mouth every 6 (six) hours as needed for severe pain. (Patient not taking: Reported on 08/25/2018), Disp: 30 tablet, Rfl: 0 .  promethazine (PHENERGAN) 12.5 MG tablet, Take 1 tablet (12.5 mg total) by mouth every 6 (six) hours as needed for nausea or vomiting. (Patient not taking: Reported on 08/25/2018), Disp: 12 tablet, Rfl: 0 .  rivaroxaban (XARELTO) 20 MG TABS tablet, Take 20 mg by mouth daily., Disp: , Rfl:  .  tiZANidine (ZANAFLEX) 4 MG tablet, Take by mouth., Disp: , Rfl:  .  topiramate (TOPAMAX) 25 MG tablet,  Take by mouth., Disp: , Rfl:   Past Medical History: Past Medical History:  Diagnosis Date  . Allergy    seasonal  . Anxiety   . Arthritis    Right Knee  . Asthma   . CHF (congestive heart failure) ([place])   . COPD (chronic obstructive pulmonary disease) ([place])   . Coronary artery disease    Leaky heart valve  . Fibromyalgia   . GERD (gastroesophageal reflux disease)   . Hypertension   . Pneumonia   . PUD (peptic ulcer disease)   . Pulmonary HTN ([place])   . Rheumatic fever/heart disease   . Sleep apnea     Tobacco Use: Social History   Tobacco  Use  Smoking Status Former Smoker  . Packs/day: 0.50  . Types: Cigarettes  Smokeless Tobacco Never Used    Labs: Recent Review Flowsheet Data    Labs for ITP Cardiac and Pulmonary Rehab Latest Ref Rng & Units 06/03/2012 12/09/2013 04/03/2014 08/21/2014 12/20/2017   Cholestrol 0 - 200 mg/dL - 91 96 - -   LDLCALC mg/dL - 50 51 - -   HDL 35 - 70 mg/dL - 31(L) 32(A) - -   Trlycerides 40 - 160 mg/dL - 50 63 - -   Hemoglobin A1c - 4.5 - - 5.2 -   PHART 7.350 - 7.450 - - - - 7.39   PCO2ART 32.0 - 48.0 mmHg - - - - 41   HCO3 20.0 - 28.0 mmol/L - - - - 24.8   ACIDBASEDEF 0.0 - 2.0 mmol/L - - - - 0.2   O2SAT % - - - - 98.4       Exercise Target Goals: Exercise Program Goal: Individual exercise prescription set using results from initial 6 min walk test and THRR while considering  patient's activity barriers and safety.   Exercise Prescription Goal: Initial exercise prescription builds to 30-45 minutes a day of aerobic activity, 2-3 days per week.  Home exercise guidelines will be given to patient during program as part of exercise prescription that the participant will acknowledge.  Activity Barriers & Risk Stratification: Activity Barriers & Cardiac Risk Stratification - 08/25/18 1550      Activity Barriers & Cardiac Risk Stratification   Activity Barriers  Arthritis;Back Problems;Shortness of Breath;Joint Problems;Fibromyalgia    Cardiac Risk Stratification  Moderate       6 Minute Walk: 6 Minute Walk    Row Name 08/25/18 1500         6 Minute Walk   Phase  Initial     Distance  965 feet     Walk Time  6 minutes     # of Rest Breaks  0     MPH  1.83     METS  2.7     RPE  11     Perceived Dyspnea   2     VO2 Peak  9.46     Symptoms  Yes (comment)     Comments  back pain 6/10 / pulled 02 tank on 2 L     Resting HR  102 bpm     Resting BP  126/90     Resting Oxygen Saturation   98 %     Exercise Oxygen Saturation  during 6 min walk  93 %     Max Ex. HR  110 bpm     Max  Ex. BP  136/84     2 Minute Post BP  116/84  Interval Oxygen   Interval Oxygen?  Yes     1 Minute Oxygen Saturation %  93 %     1 Minute Liters of Oxygen  2 L     2 Minute Oxygen Saturation %  95 %     2 Minute Liters of Oxygen  2 L     3 Minute Liters of Oxygen  2 L     4 Minute Oxygen Saturation %  95 %     4 Minute Liters of Oxygen  2 L     5 Minute Liters of Oxygen  2 L     6 Minute Oxygen Saturation %  97 %     6 Minute Liters of Oxygen  2 L     2 Minute Post Oxygen Saturation %  97 %     2 Minute Post Liters of Oxygen  2 L        Oxygen Initial Assessment:   Oxygen Re-Evaluation:   Oxygen Discharge (Final Oxygen Re-Evaluation):   Initial Exercise Prescription: Initial Exercise Prescription - 08/25/18 1500      Date of Initial Exercise RX and Referring Provider   Date  08/25/18    Referring Provider  [name]      Oxygen   Oxygen  Continuous    Liters  2      Treadmill   MPH  1.8    Grade  1    Minutes  15    METs  2.63      NuStep   Level  2    SPM  80    Minutes  15    METs  2.6      Biostep-RELP   Level  2    SPM  50    Minutes  15    METs  2      Prescription Details   Frequency (times per week)  2    Duration  Progress to 30 minutes of continuous aerobic without signs/symptoms of physical distress      Intensity   THRR 40-80% of Max Heartrate  128-155    Ratings of Perceived Exertion  11-13    Perceived Dyspnea  0-4      Resistance Training   Training Prescription  Yes    Weight  3 lb    Reps  10-15       Perform Capillary Blood Glucose checks as needed.  Exercise Prescription Changes: Exercise Prescription Changes    Row Name 08/25/18 1500             Response to Exercise   Blood Pressure (Admit)  126/90       Blood Pressure (Exercise)  136/84       Blood Pressure (Exit)  116/84       Heart Rate (Admit)  102 bpm       Heart Rate (Exercise)  110 bpm       Heart Rate (Exit)  102 bpm       Oxygen Saturation (Admit)   96 %       Oxygen Saturation (Exercise)  93 %       Oxygen Saturation (Exit)  97 %       Rating of Perceived Exertion (Exercise)  11       Perceived Dyspnea (Exercise)  2       Symptoms  back pain       Comments  gout L toe       Duration  Progress to 30 minutes of  aerobic without signs/symptoms of physical distress          Exercise Comments:   Exercise Goals and Review: Exercise Goals    Row Name 08/25/18 1500             Exercise Goals   Increase Physical Activity  Yes       Intervention  Provide advice, education, support and counseling about physical activity/exercise needs.;Develop an individualized exercise prescription for aerobic and resistive training based on initial evaluation findings, risk stratification, comorbidities and participant's personal goals.       Expected Outcomes  Short Term: Attend rehab on a regular basis to increase amount of physical activity.;Long Term: Add in home exercise to make exercise part of routine and to increase amount of physical activity.;Long Term: Exercising regularly at least 3-5 days a week.       Increase Strength and Stamina  Yes       Intervention  Provide advice, education, support and counseling about physical activity/exercise needs.;Develop an individualized exercise prescription for aerobic and resistive training based on initial evaluation findings, risk stratification, comorbidities and participant's personal goals.       Expected Outcomes  Short Term: Increase workloads from initial exercise prescription for resistance, speed, and METs.;Short Term: Perform resistance training exercises routinely during rehab and add in resistance training at home;Long Term: Improve cardiorespiratory fitness, muscular endurance and strength as measured by increased METs and functional capacity (6MWT)       Able to understand and use rate of perceived exertion (RPE) scale  Yes       Intervention  Provide education and explanation on how to use  RPE scale       Expected Outcomes  Short Term: Able to use RPE daily in rehab to express subjective intensity level;Long Term:  Able to use RPE to guide intensity level when exercising independently       Able to understand and use Dyspnea scale  Yes       Intervention  Provide education and explanation on how to use Dyspnea scale       Expected Outcomes  Short Term: Able to use Dyspnea scale daily in rehab to express subjective sense of shortness of breath during exertion;Long Term: Able to use Dyspnea scale to guide intensity level when exercising independently       Knowledge and understanding of Target Heart Rate Range (THRR)  Yes       Intervention  Provide education and explanation of THRR including how the numbers were predicted and where they are located for reference       Expected Outcomes  Short Term: Able to state/look up THRR;Short Term: Able to use daily as guideline for intensity in rehab;Long Term: Able to use THRR to govern intensity when exercising independently       Able to check pulse independently  Yes       Intervention  Provide education and demonstration on how to check pulse in carotid and radial arteries.;Review the importance of being able to check your own pulse for safety during independent exercise       Expected Outcomes  Short Term: Able to explain why pulse checking is important during independent exercise;Long Term: Able to check pulse independently and accurately       Understanding of Exercise Prescription  Yes       Intervention  Provide education, explanation, and written materials on patient's individual exercise prescription  Expected Outcomes  Short Term: Able to explain program exercise prescription;Long Term: Able to explain home exercise prescription to exercise independently          Exercise Goals Re-Evaluation :   Discharge Exercise Prescription (Final Exercise Prescription Changes): Exercise Prescription Changes - 08/25/18 1500       Response to Exercise   Blood Pressure (Admit)  126/90    Blood Pressure (Exercise)  136/84    Blood Pressure (Exit)  116/84    Heart Rate (Admit)  102 bpm    Heart Rate (Exercise)  110 bpm    Heart Rate (Exit)  102 bpm    Oxygen Saturation (Admit)  96 %    Oxygen Saturation (Exercise)  93 %    Oxygen Saturation (Exit)  97 %    Rating of Perceived Exertion (Exercise)  11    Perceived Dyspnea (Exercise)  2    Symptoms  back pain    Comments  gout L toe    Duration  Progress to 30 minutes of  aerobic without signs/symptoms of physical distress       Nutrition:  Target Goals: Understanding of nutrition guidelines, daily intake of sodium <1565m, cholesterol <2069m calories 30% from fat and 7% or less from saturated fats, daily to have 5 or more servings of fruits and vegetables.  Biometrics: Pre Biometrics - 08/25/18 1459      Pre Biometrics   Height  5' 6.5" (1.689 m)    Weight  258 lb 9.6 oz (117.3 kg)    Waist Circumference  47 inches    Hip Circumference  54 inches    Waist to Hip Ratio  0.87 %    BMI (Calculated)  41.12    Single Leg Stand  30 seconds        Nutrition Therapy Plan and Nutrition Goals: Nutrition Therapy & Goals - 08/25/18 1548      Intervention Plan   Intervention  Prescribe, educate and counsel regarding individualized specific dietary modifications aiming towards targeted core components such as weight, hypertension, lipid management, diabetes, heart failure and other comorbidities.;Nutrition handout(s) given to patient.    Expected Outcomes  Short Term Goal: Understand basic principles of dietary content, such as calories, fat, sodium, cholesterol and nutrients.;Short Term Goal: A plan has been developed with personal nutrition goals set during dietitian appointment.;Long Term Goal: Adherence to prescribed nutrition plan.       Nutrition Assessments: Nutrition Assessments - 08/25/18 1549      MEDFICTS Scores   Pre Score  125       Nutrition  Goals Re-Evaluation:   Nutrition Goals Discharge (Final Nutrition Goals Re-Evaluation):   Psychosocial: Target Goals: Acknowledge presence or absence of significant depression and/or stress, maximize coping skills, provide positive support system. Participant is able to verbalize types and ability to use techniques and skills needed for reducing stress and depression.   Initial Review & Psychosocial Screening: Initial Psych Review & Screening - 08/25/18 1543      Initial Review   Current issues with  Current Sleep Concerns;Current Stress Concerns   has been sleeping on the couch since surgery (3 months ago) because of sternal discomfort. Wants to start sleeping in bed.    Source of Stress Concerns  Transportation;Unable to perform yard/household activities;Financial    Comments  Does not drive. Relies on family members to drive. Wants to get back to hobbies/doing house work/ building up stamina.       Family Dynamics   Good Support  System?  Yes   mother and aunt     Screening Interventions   Interventions  Encouraged to exercise;Program counselor consult;To provide support and resources with identified psychosocial needs;Provide feedback about the scores to participant    Expected Outcomes  Short Term goal: Utilizing psychosocial counselor, staff and physician to assist with identification of specific Stressors or current issues interfering with healing process. Setting desired goal for each stressor or current issue identified.;Long Term Goal: Stressors or current issues are controlled or eliminated.;Short Term goal: Identification and review with participant of any Quality of Life or Depression concerns found by scoring the questionnaire.;Long Term goal: The participant improves quality of Life and PHQ9 Scores as seen by post scores and/or verbalization of changes       Quality of Life Scores:  Quality of Life - 08/25/18 1545      Quality of Life   Select  Quality of Life       Quality of Life Scores   Health/Function Pre  16.4 %    Socioeconomic Pre  22.29 %    Psych/Spiritual Pre  18 %    Family Pre  27 %    GLOBAL Pre  19.27 %      Scores of 19 and below usually indicate a poorer quality of life in these areas.  A difference of  2-3 points is a clinically meaningful difference.  A difference of 2-3 points in the total score of the Quality of Life Index has been associated with significant improvement in overall quality of life, self-image, physical symptoms, and general health in studies assessing change in quality of life.  PHQ-9: Recent Review Flowsheet Data    Depression screen [facility] 2/9 08/25/2018 07/26/2018   Decreased Interest 1 0   Down, Depressed, Hopeless 0 0   PHQ - 2 Score 1 0   Altered sleeping 2 -   Tired, decreased energy 2 -   Change in appetite 1 -   Feeling bad or failure about yourself  0 -   Trouble concentrating 2 -   Moving slowly or fidgety/restless 0 -   Suicidal thoughts 0 -   PHQ-9 Score 8 -   Difficult doing work/chores Not difficult at all -     Interpretation of Total Score  Total Score Depression Severity:  1-4 = Minimal depression, 5-9 = Mild depression, 10-14 = Moderate depression, 15-19 = Moderately severe depression, 20-27 = Severe depression   Psychosocial Evaluation and Intervention:   Psychosocial Re-Evaluation:   Psychosocial Discharge (Final Psychosocial Re-Evaluation):   Vocational Rehabilitation: Provide vocational rehab assistance to qualifying candidates.   Vocational Rehab Evaluation & Intervention: Vocational Rehab - 08/25/18 1542      Initial Vocational Rehab Evaluation & Intervention   Assessment shows need for Vocational Rehabilitation  Yes    Vocational Rehab Packet given to patient  --   paperwork will be given first day of class      Education: Education Goals: Education classes will be provided on a variety of topics geared toward better understanding of heart health and risk factor  modification. Participant will state understanding/return demonstration of topics presented as noted by education test scores.  Learning Barriers/Preferences: Learning Barriers/Preferences - 08/25/18 1541      Learning Barriers/Preferences   Learning Barriers  None    Learning Preferences  Individual Instruction       Education Topics:  AED/CPR: - Group verbal and written instruction with the use of models to demonstrate the basic use of  the AED with the basic ABC's of resuscitation.   General Nutrition Guidelines/Fats and Fiber: -Group instruction provided by verbal, written material, models and posters to present the general guidelines for heart healthy nutrition. Gives an explanation and review of dietary fats and fiber.   Controlling Sodium/Reading Food Labels: -Group verbal and written material supporting the discussion of sodium use in heart healthy nutrition. Review and explanation with models, verbal and written materials for utilization of the food label.   Exercise Physiology & General Exercise Guidelines: - Group verbal and written instruction with models to review the exercise physiology of the cardiovascular system and associated critical values. Provides general exercise guidelines with specific guidelines to those with heart or lung disease.    Aerobic Exercise & Resistance Training: - Gives group verbal and written instruction on the various components of exercise. Focuses on aerobic and resistive training programs and the benefits of this training and how to safely progress through these programs..   Flexibility, Balance, Mind/Body Relaxation: Provides group verbal/written instruction on the benefits of flexibility and balance training, including mind/body exercise modes such as yoga, pilates and tai chi.  Demonstration and skill practice provided.   Stress and Anxiety: - Provides group verbal and written instruction about the health risks of elevated stress and  causes of high stress.  Discuss the correlation between heart/lung disease and anxiety and treatment options. Review healthy ways to manage with stress and anxiety.   Depression: - Provides group verbal and written instruction on the correlation between heart/lung disease and depressed mood, treatment options, and the stigmas associated with seeking treatment.   Anatomy & Physiology of the Heart: - Group verbal and written instruction and models provide basic cardiac anatomy and physiology, with the coronary electrical and arterial systems. Review of Valvular disease and Heart Failure   Cardiac Procedures: - Group verbal and written instruction to review commonly prescribed medications for heart disease. Reviews the medication, class of the drug, and side effects. Includes the steps to properly store meds and maintain the prescription regimen. (beta blockers and nitrates)   Cardiac Medications I: - Group verbal and written instruction to review commonly prescribed medications for heart disease. Reviews the medication, class of the drug, and side effects. Includes the steps to properly store meds and maintain the prescription regimen.   Cardiac Medications II: -Group verbal and written instruction to review commonly prescribed medications for heart disease. Reviews the medication, class of the drug, and side effects. (all other drug classes)    Go Sex-Intimacy & Heart Disease, Get SMART - Goal Setting: - Group verbal and written instruction through game format to discuss heart disease and the return to sexual intimacy. Provides group verbal and written material to discuss and apply goal setting through the application of the S.M.A.R.T. Method.   Other Matters of the Heart: - Provides group verbal, written materials and models to describe Stable Angina and Peripheral Artery. Includes description of the disease process and treatment options available to the cardiac patient.   Exercise &  Equipment Safety: - Individual verbal instruction and demonstration of equipment use and safety with use of the equipment.   Cardiac Rehab from 08/25/2018 in [facility] Cardiac and Pulmonary Rehab  Date  08/25/18  Educator  [facility]  Instruction Review Code  1- Verbalizes Understanding      Infection Prevention: - Provides verbal and written material to individual with discussion of infection control including proper hand washing and proper equipment cleaning during exercise session.   Cardiac Rehab  from 08/25/2018 in [facility] Cardiac and Pulmonary Rehab  Date  08/25/18  Educator  [facility]  Instruction Review Code  1- Verbalizes Understanding      Falls Prevention: - Provides verbal and written material to individual with discussion of falls prevention and safety.   Cardiac Rehab from 08/25/2018 in [facility] Cardiac and Pulmonary Rehab  Date  08/25/18  Educator  [facility]  Instruction Review Code  1- Verbalizes Understanding      Diabetes: - Individual verbal and written instruction to review signs/symptoms of diabetes, desired ranges of glucose level fasting, after meals and with exercise. Acknowledge that pre and post exercise glucose checks will be done for 3 sessions at entry of program.   Know Your Numbers and Risk Factors: -Group verbal and written instruction about important numbers in your health.  Discussion of what are risk factors and how they play a role in the disease process.  Review of Cholesterol, Blood Pressure, Diabetes, and BMI and the role they play in your overall health.   Sleep Hygiene: -Provides group verbal and written instruction about how sleep can affect your health.  Define sleep hygiene, discuss sleep cycles and impact of sleep habits. Review good sleep hygiene tips.    Other: -Provides group and verbal instruction on various topics (see comments)   Knowledge Questionnaire Score: Knowledge Questionnaire Score - 08/25/18 1541      Knowledge Questionnaire Score   Pre Score   19/26   correct answers reviewed with [place], focus on exercise, nutrition, and MI      Core Components/Risk Factors/Patient Goals at Admission: Personal Goals and Risk Factors at Admission - 08/25/18 1539      Core Components/Risk Factors/Patient Goals on Admission    Weight Management  Yes;Obesity;Weight Loss    Intervention  Weight Management: Develop a combined nutrition and exercise program designed to reach desired caloric intake, while maintaining appropriate intake of nutrient and fiber, sodium and fats, and appropriate energy expenditure required for the weight goal.;Weight Management: Provide education and appropriate resources to help participant work on and attain dietary goals.;Weight Management/Obesity: Establish reasonable short term and long term weight goals.;Obesity: Provide education and appropriate resources to help participant work on and attain dietary goals.    Admit Weight  258 lb 9.6 oz (117.3 kg)    Goal Weight: Short Term  253 lb (114.8 kg)    Goal Weight: Long Term  200 lb (90.7 kg)    Expected Outcomes  Short Term: Continue to assess and modify interventions until short term weight is achieved;Long Term: Adherence to nutrition and physical activity/exercise program aimed toward attainment of established weight goal;Weight Loss: Understanding of general recommendations for a balanced deficit meal plan, which promotes 1-2 lb weight loss per week and includes a negative energy balance of [phone] kcal/d;Understanding recommendations for meals to include 15-35% energy as protein, 25-35% energy from fat, 35-60% energy from carbohydrates, less than 247m of dietary cholesterol, 20-35 gm of total fiber daily;Understanding of distribution of calorie intake throughout the day with the consumption of 4-5 meals/snacks    Tobacco Cessation  Yes    Intervention  Assist the participant in steps to quit. Provide individualized education and counseling about committing to Tobacco Cessation,  relapse prevention, and pharmacological support that can be provided by physician.;OAdvice worker assist with locating and accessing local/national Quit Smoking programs, and support quit date choice.    Expected Outcomes  Short Term: Will demonstrate readiness to quit, by selecting a quit date.;Short Term: Will quit  all tobacco product use, adhering to prevention of relapse plan.;Long Term: Complete abstinence from all tobacco products for at least 12 months from quit date.    Heart Failure  Yes    Intervention  Provide a combined exercise and nutrition program that is supplemented with education, support and counseling about heart failure. Directed toward relieving symptoms such as shortness of breath, decreased exercise tolerance, and extremity edema.    Expected Outcomes  Improve functional capacity of life;Short term: Attendance in program 2-3 days a week with increased exercise capacity. Reported lower sodium intake. Reported increased fruit and vegetable intake. Reports medication compliance.;Short term: Daily weights obtained and reported for increase. Utilizing diuretic protocols set by physician.;Long term: Adoption of self-care skills and reduction of barriers for early signs and symptoms recognition and intervention leading to self-care maintenance.    Hypertension  Yes    Intervention  Provide education on lifestyle modifcations including regular physical activity/exercise, weight management, moderate sodium restriction and increased consumption of fresh fruit, vegetables, and low fat dairy, alcohol moderation, and smoking cessation.;Monitor prescription use compliance.    Expected Outcomes  Short Term: Continued assessment and intervention until BP is < 140/42m HG in hypertensive participants. < 130/850mHG in hypertensive participants with diabetes, heart failure or chronic kidney disease.;Long Term: Maintenance of blood pressure at goal levels.       Core Components/Risk  Factors/Patient Goals Review:    Core Components/Risk Factors/Patient Goals at Discharge (Final Review):    ITP Comments: ITP Comments    Row Name 08/25/18 1531 09/07/18 0607         ITP Comments  Med Review completed. Initial ITP created. [facility] completed. ITP sent to Dr. MaEmily FilbertMedical Director of Cardiac Rehab. Continue with ITP unless changes are made by physician  Starts sessions on 10/10         Comments:

## 2018-09-08 ENCOUNTER — Encounter: Payer: Medicaid Other | Admitting: *Deleted

## 2018-09-08 DIAGNOSIS — I050 Rheumatic mitral stenosis: Secondary | ICD-10-CM | POA: Diagnosis present

## 2018-09-08 DIAGNOSIS — I051 Rheumatic mitral insufficiency: Secondary | ICD-10-CM | POA: Diagnosis present

## 2018-09-08 DIAGNOSIS — Z952 Presence of prosthetic heart valve: Secondary | ICD-10-CM

## 2018-09-08 DIAGNOSIS — R269 Unspecified abnormalities of gait and mobility: Secondary | ICD-10-CM | POA: Diagnosis not present

## 2018-09-08 NOTE — Progress Notes (Signed)
Daily Session Note  Patient Details  Name: Chelsea Ramsey MRN: [id number] Date of Birth: 11-24-1966 Referring Provider:     Cardiac Rehab from 08/25/2018 in [facility] Cardiac and Pulmonary Rehab  Referring Provider  [name]      Encounter Date: 09/08/2018  Check In: Session Check In - 09/08/18 0909      Check-In   Supervising physician immediately available to respond to emergencies  See telemetry face sheet for immediately available ER MD    Location  [facility]-Cardiac & Pulmonary Rehab    Staff Present  Alberteen Sam, MA, RCEP, CCRP, Exercise Physiologist;Joseph Demopolis;Vida Rigger RN, BSN;Carroll Enterkin, RN, BSN    Medication changes reported      No    Fall or balance concerns reported     No    Warm-up and Cool-down  Performed on first and last piece of Teacher, music Performed  Yes    VAD Patient?  No    PAD/SET Patient?  No      Pain Assessment   Currently in Pain?  No/denies          Social History   Tobacco Use  Smoking Status Former Smoker  . Packs/day: 0.50  . Types: Cigarettes  Smokeless Tobacco Never Used    Goals Met:  Independence with exercise equipment Exercise tolerated well No report of cardiac concerns or symptoms Strength training completed today  Goals Unmet:  Not Applicable  Comments: First full day of exercise!  Patient was oriented to gym and equipment including functions, settings, policies, and procedures.  Patient's individual exercise prescription and treatment plan were reviewed.  All starting workloads were established based on the results of the 6 minute walk test done at initial orientation visit.  The plan for exercise progression was also introduced and progression will be customized based on patient's performance and goals.   [name] is Medical Director for [place] and [facility].

## 2018-09-13 ENCOUNTER — Encounter: Payer: Medicaid Other | Admitting: *Deleted

## 2018-09-13 DIAGNOSIS — Z952 Presence of prosthetic heart valve: Secondary | ICD-10-CM

## 2018-09-13 DIAGNOSIS — R269 Unspecified abnormalities of gait and mobility: Secondary | ICD-10-CM | POA: Diagnosis not present

## 2018-09-13 NOTE — Progress Notes (Signed)
Daily Session Note  Patient Details  Name: Chelsea Ramsey MRN: [id number] Date of Birth: 08/07/1966 Referring Provider:     Cardiac Rehab from 08/25/2018 in [facility] Cardiac and Pulmonary Rehab  Referring Provider  Smith      Encounter Date: 09/13/2018  Check In: Session Check In - 09/13/18 0917      Check-In   Supervising physician immediately available to respond to emergencies  See telemetry face sheet for immediately available ER MD    Location  [facility]-Cardiac & Pulmonary Rehab    Staff Present  Mary Jo Abernethy, RN, BSN, MA; , MA, RCEP, CCRP, Exercise Physiologist;Krista Spencer, RN BSN    Medication changes reported      No    Fall or balance concerns reported     No    Warm-up and Cool-down  Performed on first and last piece of equipment    Resistance Training Performed  Yes    VAD Patient?  No    PAD/SET Patient?  No      Pain Assessment   Currently in Pain?  No/denies          Social History   Tobacco Use  Smoking Status Former Smoker  . Packs/day: 0.50  . Types: Cigarettes  Smokeless Tobacco Never Used    Goals Met:  Exercise tolerated well No report of cardiac concerns or symptoms Strength training completed today  Goals Unmet:  Not Applicable  Comments: Pt able to follow exercise prescription today without complaint.  Will continue to monitor for progression.    Dr. Mark Miller is Medical Director for [facility] and [facility]. 

## 2018-09-22 DIAGNOSIS — R269 Unspecified abnormalities of gait and mobility: Secondary | ICD-10-CM | POA: Diagnosis not present

## 2018-09-22 DIAGNOSIS — Z952 Presence of prosthetic heart valve: Secondary | ICD-10-CM

## 2018-09-22 NOTE — Progress Notes (Signed)
Daily Session Note  Patient Details  Name: Chelsea Ramsey MRN: [id number] Date of Birth: 15-Apr-1966 Referring Provider:     Cardiac Rehab from 08/25/2018 in [facility] Cardiac and Pulmonary Rehab  Referring Provider  [name]      Encounter Date: 09/22/2018  Check In: Session Check In - 09/22/18 0913      Check-In   Supervising physician immediately available to respond to emergencies  See telemetry face sheet for immediately available ER MD    Staff Present  Carson Myrtle, BS, RRT, Respiratory Therapist;Carroll Enterkin, RN, BSN;Jessica Hawkins, MA, RCEP, CCRP, Exercise Physiologist    Medication changes reported      No    Fall or balance concerns reported     No    Tobacco Cessation  No Change    Warm-up and Cool-down  Performed on first and last piece of equipment    Resistance Training Performed  Yes    VAD Patient?  No      Pain Assessment   Currently in Pain?  No/denies          Social History   Tobacco Use  Smoking Status Former Smoker  . Packs/day: 0.50  . Types: Cigarettes  Smokeless Tobacco Never Used    Goals Met:  Independence with exercise equipment Exercise tolerated well No report of cardiac concerns or symptoms Strength training completed today  Goals Unmet:  Not Applicable  Comments: Pt able to follow exercise prescription today without complaint.  Will continue to monitor for progression.   [name] is Medical Director for [place] and [facility].

## 2018-09-27 ENCOUNTER — Encounter: Payer: Medicaid Other | Admitting: *Deleted

## 2018-09-27 DIAGNOSIS — R269 Unspecified abnormalities of gait and mobility: Secondary | ICD-10-CM | POA: Diagnosis not present

## 2018-09-27 DIAGNOSIS — Z952 Presence of prosthetic heart valve: Secondary | ICD-10-CM

## 2018-09-27 NOTE — Progress Notes (Signed)
Daily Session Note  Patient Details  Name: Chelsea Ramsey MRN: [id number] Date of Birth: June 07, 1966 Referring Provider:     Cardiac Rehab from 08/25/2018 in [facility] Cardiac and Pulmonary Rehab  Referring Provider  [name]      Encounter Date: 09/27/2018  Check In: Session Check In - 09/27/18 0909      Check-In   Supervising physician immediately available to respond to emergencies  See telemetry face sheet for immediately available ER MD    Location  [facility]-Cardiac & Pulmonary Rehab    Staff Present  Alberteen Sam, MA, RCEP, CCRP, Exercise Physiologist;Krista Frederico Hamman, RN Vickki Hearing, BA, ACSM CEP, Exercise Physiologist    Medication changes reported      No    Fall or balance concerns reported     No    Warm-up and Cool-down  Performed on first and last piece of equipment    Resistance Training Performed  Yes    VAD Patient?  No    PAD/SET Patient?  No      Pain Assessment   Currently in Pain?  No/denies          Social History   Tobacco Use  Smoking Status Former Smoker  . Packs/day: 0.50  . Types: Cigarettes  Smokeless Tobacco Never Used    Goals Met:  Independence with exercise equipment Exercise tolerated well Personal goals reviewed No report of cardiac concerns or symptoms Strength training completed today  Goals Unmet:  Not Applicable  Comments: Pt able to follow exercise prescription today without complaint.  Will continue to monitor for progression. Reviewed home exercise with pt today.  Pt plans to walk at park for exercise.  Reviewed THR, pulse, RPE, sign and symptoms, and when to call 911 or MD.  Also discussed weather considerations and indoor options.  Pt voiced understanding.    [name] is Medical Director for [place] and [facility].

## 2018-10-04 ENCOUNTER — Encounter: Payer: Medicaid Other | Attending: Cardiovascular Disease

## 2018-10-04 DIAGNOSIS — I051 Rheumatic mitral insufficiency: Secondary | ICD-10-CM | POA: Insufficient documentation

## 2018-10-04 DIAGNOSIS — I050 Rheumatic mitral stenosis: Secondary | ICD-10-CM | POA: Insufficient documentation

## 2018-10-04 DIAGNOSIS — R269 Unspecified abnormalities of gait and mobility: Secondary | ICD-10-CM | POA: Insufficient documentation

## 2018-10-05 ENCOUNTER — Encounter: Payer: Self-pay | Admitting: *Deleted

## 2018-10-05 DIAGNOSIS — Z952 Presence of prosthetic heart valve: Secondary | ICD-10-CM

## 2018-10-05 NOTE — Progress Notes (Signed)
Cardiac Individual Treatment Plan  Patient Details  Name: Chelsea Ramsey MRN: [id number] Date of Birth: 1966/09/15 Referring Provider:     Cardiac Rehab from 08/25/2018 in [facility] Cardiac and Pulmonary Rehab  Referring Provider  [name]      Initial Encounter Date:    Cardiac Rehab from 08/25/2018 in [facility] Cardiac and Pulmonary Rehab  Date  08/25/18      Visit Diagnosis: S/P mitral valve replacement  Patient's Home Medications on Admission:  Current Outpatient Medications:  .  albuterol (PROVENTIL HFA;VENTOLIN HFA) 108 (90 Base) MCG/ACT inhaler, Inhale into the lungs., Disp: , Rfl:  .  bisacodyl (DULCOLAX) 5 MG EC tablet, Take 2 tablets (10 mg total) by mouth daily as needed for moderate constipation., Disp: 30 tablet, Rfl: 0 .  budesonide-formoterol (SYMBICORT) 160-4.5 MCG/ACT inhaler, Inhale 2 puffs into the lungs 2 (two) times daily., Disp: , Rfl:  .  bumetanide (BUMEX) 2 MG tablet, Take 2 tablets (4 mg total) by mouth daily. (Patient taking differently: Take 2 mg by mouth daily. ), Disp: 30 tablet, Rfl: 0 .  diltiazem (CARDIZEM CD) 240 MG 24 hr capsule, Take 1 capsule (240 mg total) by mouth daily. (Patient not taking: Reported on 08/25/2018), Disp: 30 capsule, Rfl: 0 .  ferrous sulfate 325 (65 FE) MG tablet, Take 325 mg by mouth daily., Disp: , Rfl:  .  gabapentin (NEURONTIN) 300 MG capsule, Take 600 mg by mouth 3 (three) times daily. , Disp: , Rfl:  .  HYDROcodone-acetaminophen (NORCO) 10-325 MG tablet, Take 1 tablet by mouth daily as needed., Disp: , Rfl: 0 .  ipratropium (ATROVENT) 0.02 % nebulizer solution, Inhale into the lungs., Disp: , Rfl:  .  ipratropium-albuterol (DUONEB) 0.5-2.5 (3) MG/3ML SOLN, Inhale 3 mLs into the lungs every 8 (eight) hours as needed (respiratory). , Disp: , Rfl:  .  lactulose (CHRONULAC) 10 GM/15ML solution, Take 45 mLs (30 g total) by mouth 2 (two) times daily as needed for mild constipation. (Patient not taking: Reported on 08/25/2018), Disp: 240 mL,  Rfl: 0 .  lidocaine (LIDODERM) 5 %, Place onto the skin., Disp: , Rfl:  .  magnesium oxide (MAG-OX) 400 MG tablet, Take 400 mg by mouth 2 (two) times daily., Disp: , Rfl:  .  metoprolol tartrate (LOPRESSOR) 25 MG tablet, Take 1 tablet (25 mg total) by mouth 2 (two) times daily. (Patient not taking: Reported on 08/25/2018), Disp: 60 tablet, Rfl: 0 .  nystatin (MYCOSTATIN/NYSTOP) powder, Apply topically 2 (two) times daily as needed (yeast). (Patient not taking: Reported on 08/25/2018), Disp: 15 g, Rfl: 0 .  omeprazole (PRILOSEC) 40 MG capsule, Take 40 mg by mouth 2 (two) times daily., Disp: , Rfl:  .  oxyCODONE-acetaminophen (PERCOCET/ROXICET) 5-325 MG tablet, Take 1-2 tablets by mouth every 6 (six) hours as needed for severe pain. (Patient not taking: Reported on 08/25/2018), Disp: 30 tablet, Rfl: 0 .  promethazine (PHENERGAN) 12.5 MG tablet, Take 1 tablet (12.5 mg total) by mouth every 6 (six) hours as needed for nausea or vomiting. (Patient not taking: Reported on 08/25/2018), Disp: 12 tablet, Rfl: 0 .  rivaroxaban (XARELTO) 20 MG TABS tablet, Take 20 mg by mouth daily., Disp: , Rfl:  .  tiZANidine (ZANAFLEX) 4 MG tablet, Take by mouth., Disp: , Rfl:  .  topiramate (TOPAMAX) 25 MG tablet, Take by mouth., Disp: , Rfl:   Past Medical History: Past Medical History:  Diagnosis Date  . Allergy    seasonal  . Anxiety   . Arthritis  Right Knee  . Asthma   . CHF (congestive heart failure) ([place])   . COPD (chronic obstructive pulmonary disease) ([place])   . Coronary artery disease    Leaky heart valve  . Fibromyalgia   . GERD (gastroesophageal reflux disease)   . Hypertension   . Pneumonia   . PUD (peptic ulcer disease)   . Pulmonary HTN ([place])   . Rheumatic fever/heart disease   . Sleep apnea     Tobacco Use: Social History   Tobacco Use  Smoking Status Former Smoker  . Packs/day: 0.50  . Types: Cigarettes  Smokeless Tobacco Never Used    Labs: Recent Review Flowsheet Data    Labs  for ITP Cardiac and Pulmonary Rehab Latest Ref Rng & Units 06/03/2012 12/09/2013 04/03/2014 08/21/2014 12/20/2017   Cholestrol 0 - 200 mg/dL - 91 96 - -   LDLCALC mg/dL - 50 51 - -   HDL 35 - 70 mg/dL - 31(L) 32(A) - -   Trlycerides 40 - 160 mg/dL - 50 63 - -   Hemoglobin A1c - 4.5 - - 5.2 -   PHART 7.350 - 7.450 - - - - 7.39   PCO2ART 32.0 - 48.0 mmHg - - - - 41   HCO3 20.0 - 28.0 mmol/L - - - - 24.8   ACIDBASEDEF 0.0 - 2.0 mmol/L - - - - 0.2   O2SAT % - - - - 98.4       Exercise Target Goals: Exercise Program Goal: Individual exercise prescription set using results from initial 6 min walk test and THRR while considering  patient's activity barriers and safety.   Exercise Prescription Goal: Initial exercise prescription builds to 30-45 minutes a day of aerobic activity, 2-3 days per week.  Home exercise guidelines will be given to patient during program as part of exercise prescription that the participant will acknowledge.  Activity Barriers & Risk Stratification: Activity Barriers & Cardiac Risk Stratification - 08/25/18 1550      Activity Barriers & Cardiac Risk Stratification   Activity Barriers  Arthritis;Back Problems;Shortness of Breath;Joint Problems;Fibromyalgia    Cardiac Risk Stratification  Moderate       6 Minute Walk: 6 Minute Walk    Row Name 08/25/18 1500         6 Minute Walk   Phase  Initial     Distance  965 feet     Walk Time  6 minutes     # of Rest Breaks  0     MPH  1.83     METS  2.7     RPE  11     Perceived Dyspnea   2     VO2 Peak  9.46     Symptoms  Yes (comment)     Comments  back pain 6/10 / pulled 02 tank on 2 L     Resting HR  102 bpm     Resting BP  126/90     Resting Oxygen Saturation   98 %     Exercise Oxygen Saturation  during 6 min walk  93 %     Max Ex. HR  110 bpm     Max Ex. BP  136/84     2 Minute Post BP  116/84       Interval Oxygen   Interval Oxygen?  Yes     1 Minute Oxygen Saturation %  93 %     1 Minute Liters of  Oxygen  2  L     2 Minute Oxygen Saturation %  95 %     2 Minute Liters of Oxygen  2 L     3 Minute Liters of Oxygen  2 L     4 Minute Oxygen Saturation %  95 %     4 Minute Liters of Oxygen  2 L     5 Minute Liters of Oxygen  2 L     6 Minute Oxygen Saturation %  97 %     6 Minute Liters of Oxygen  2 L     2 Minute Post Oxygen Saturation %  97 %     2 Minute Post Liters of Oxygen  2 L        Oxygen Initial Assessment:   Oxygen Re-Evaluation: Oxygen Re-Evaluation    Row Name 09/27/18 1012             Program Oxygen Prescription   Program Oxygen Prescription  None         Home Oxygen   Home Oxygen Device  Portable Concentrator;Home Concentrator       Sleep Oxygen Prescription  Continuous       Liters per minute  3       Home Exercise Oxygen Prescription  None       Home at Rest Exercise Oxygen Prescription  None uses as needed on 2L       Compliance with Home Oxygen Use  Yes         Goals/Expected Outcomes   Short Term Goals  To learn and exhibit compliance with exercise, home and travel O2 prescription;To learn and understand importance of monitoring SPO2 with pulse oximeter and demonstrate accurate use of the pulse oximeter.;To learn and understand importance of maintaining oxygen saturations>88%;To learn and demonstrate proper pursed lip breathing techniques or other breathing techniques.;To learn and demonstrate proper use of respiratory medications       Long  Term Goals  Exhibits compliance with exercise, home and travel O2 prescription;Verbalizes importance of monitoring SPO2 with pulse oximeter and return demonstration;Maintenance of O2 saturations>88%;Exhibits proper breathing techniques, such as pursed lip breathing or other method taught during program session;Compliance with respiratory medication       Comments  Chelsea Ramsey is doing well in rehab.  She is doing well with her nebulaizers and inhalers.  She monitors her sats daily with pulse oximeter.  We talked about using  PLB regularly. She is compliant with her oxygen at night and only uses it during the day if she feels SOB.       Goals/Expected Outcomes  Short: Continue to monitor saturations and wean off day time use of oxygen.  Long: Continue monitor pulmonary disease.           Oxygen Discharge (Final Oxygen Re-Evaluation): Oxygen Re-Evaluation - 09/27/18 1012      Program Oxygen Prescription   Program Oxygen Prescription  None      Home Oxygen   Home Oxygen Device  Portable Concentrator;Home Concentrator    Sleep Oxygen Prescription  Continuous    Liters per minute  3    Home Exercise Oxygen Prescription  None    Home at Rest Exercise Oxygen Prescription  None   uses as needed on 2L   Compliance with Home Oxygen Use  Yes      Goals/Expected Outcomes   Short Term Goals  To learn and exhibit compliance with exercise, home and travel O2 prescription;To learn and understand importance of monitoring  SPO2 with pulse oximeter and demonstrate accurate use of the pulse oximeter.;To learn and understand importance of maintaining oxygen saturations>88%;To learn and demonstrate proper pursed lip breathing techniques or other breathing techniques.;To learn and demonstrate proper use of respiratory medications    Long  Term Goals  Exhibits compliance with exercise, home and travel O2 prescription;Verbalizes importance of monitoring SPO2 with pulse oximeter and return demonstration;Maintenance of O2 saturations>88%;Exhibits proper breathing techniques, such as pursed lip breathing or other method taught during program session;Compliance with respiratory medication    Comments  Chelsea Ramsey is doing well in rehab.  She is doing well with her nebulaizers and inhalers.  She monitors her sats daily with pulse oximeter.  We talked about using PLB regularly. She is compliant with her oxygen at night and only uses it during the day if she feels SOB.    Goals/Expected Outcomes  Short: Continue to monitor saturations and wean off day  time use of oxygen.  Long: Continue monitor pulmonary disease.        Initial Exercise Prescription: Initial Exercise Prescription - 08/25/18 1500      Date of Initial Exercise RX and Referring Provider   Date  08/25/18    Referring Provider  [name]      Oxygen   Oxygen  Continuous    Liters  2      Treadmill   MPH  1.8    Grade  1    Minutes  15    METs  2.63      NuStep   Level  2    SPM  80    Minutes  15    METs  2.6      Biostep-RELP   Level  2    SPM  50    Minutes  15    METs  2      Prescription Details   Frequency (times per week)  2    Duration  Progress to 30 minutes of continuous aerobic without signs/symptoms of physical distress      Intensity   THRR 40-80% of Max Heartrate  128-155    Ratings of Perceived Exertion  11-13    Perceived Dyspnea  0-4      Resistance Training   Training Prescription  Yes    Weight  3 lb    Reps  10-15       Perform Capillary Blood Glucose checks as needed.  Exercise Prescription Changes: Exercise Prescription Changes    Row Name 08/25/18 1500 09/12/18 1600 09/27/18 1000         Response to Exercise   Blood Pressure (Admit)  126/90  132/82  122/62     Blood Pressure (Exercise)  136/84  138/60  146/74     Blood Pressure (Exit)  116/84  122/78  122/62     Heart Rate (Admit)  102 bpm  95 bpm  71 bpm     Heart Rate (Exercise)  110 bpm  112 bpm  121 bpm     Heart Rate (Exit)  102 bpm  92 bpm  103 bpm     Oxygen Saturation (Admit)  96 %  -  -     Oxygen Saturation (Exercise)  93 %  95 %  -     Oxygen Saturation (Exit)  97 %  -  -     Rating of Perceived Exertion (Exercise)  _0 Perceived Dyspnea (Exercise)  2  -  -  Symptoms  back pain  none  none     Comments  gout L toe  first full day of exercise  -     Duration  Progress to 30 minutes of  aerobic without signs/symptoms of physical distress  Progress to 30 minutes of  aerobic without signs/symptoms of physical distress  Continue with 30 min of  aerobic exercise without signs/symptoms of physical distress.     Intensity  -  THRR New  THRR unchanged       Progression   Progression  -  Continue to progress workloads to maintain intensity without signs/symptoms of physical distress.  Continue to progress workloads to maintain intensity without signs/symptoms of physical distress.     Average METs  -  2.45  2.66       Resistance Training   Training Prescription  -  Yes  Yes     Weight  -  3 lb  3 lbs     Reps  -  10-15  10-15       Interval Training   Interval Training  -  No  No       Treadmill   MPH  -  -  2.3     Grade  -  -  1     Minutes  -  -  15     METs  -  -  3.08       NuStep   Level  -  2  2     Minutes  -  15  15     METs  -  2.9  2.9       Biostep-RELP   Level  -  2  8     Minutes  -  15  15     METs  -  2  2       Home Exercise Plan   Plans to continue exercise at  -  -  Home (comment) walk at park     Frequency  -  -  Add 3 additional days to program exercise sessions.     Initial Home Exercises Provided  -  -  09/27/18        Exercise Comments: Exercise Comments    Row Name 09/08/18 0910           Exercise Comments  First full day of exercise!  Patient was oriented to gym and equipment including functions, settings, policies, and procedures.  Patient's individual exercise prescription and treatment plan were reviewed.  All starting workloads were established based on the results of the 6 minute walk test done at initial orientation visit.  The plan for exercise progression was also introduced and progression will be customized based on patient's performance and goals.          Exercise Goals and Review: Exercise Goals    Row Name 08/25/18 1500             Exercise Goals   Increase Physical Activity  Yes       Intervention  Provide advice, education, support and counseling about physical activity/exercise needs.;Develop an individualized exercise prescription for aerobic and resistive  training based on initial evaluation findings, risk stratification, comorbidities and participant's personal goals.       Expected Outcomes  Short Term: Attend rehab on a regular basis to increase amount of physical activity.;Long Term: Add in home exercise to make exercise part of routine and to increase amount of physical  activity.;Long Term: Exercising regularly at least 3-5 days a week.       Increase Strength and Stamina  Yes       Intervention  Provide advice, education, support and counseling about physical activity/exercise needs.;Develop an individualized exercise prescription for aerobic and resistive training based on initial evaluation findings, risk stratification, comorbidities and participant's personal goals.       Expected Outcomes  Short Term: Increase workloads from initial exercise prescription for resistance, speed, and METs.;Short Term: Perform resistance training exercises routinely during rehab and add in resistance training at home;Long Term: Improve cardiorespiratory fitness, muscular endurance and strength as measured by increased METs and functional capacity (6MWT)       Able to understand and use rate of perceived exertion (RPE) scale  Yes       Intervention  Provide education and explanation on how to use RPE scale       Expected Outcomes  Short Term: Able to use RPE daily in rehab to express subjective intensity level;Long Term:  Able to use RPE to guide intensity level when exercising independently       Able to understand and use Dyspnea scale  Yes       Intervention  Provide education and explanation on how to use Dyspnea scale       Expected Outcomes  Short Term: Able to use Dyspnea scale daily in rehab to express subjective sense of shortness of breath during exertion;Long Term: Able to use Dyspnea scale to guide intensity level when exercising independently       Knowledge and understanding of Target Heart Rate Range (THRR)  Yes       Intervention  Provide education  and explanation of THRR including how the numbers were predicted and where they are located for reference       Expected Outcomes  Short Term: Able to state/look up THRR;Short Term: Able to use daily as guideline for intensity in rehab;Long Term: Able to use THRR to govern intensity when exercising independently       Able to check pulse independently  Yes       Intervention  Provide education and demonstration on how to check pulse in carotid and radial arteries.;Review the importance of being able to check your own pulse for safety during independent exercise       Expected Outcomes  Short Term: Able to explain why pulse checking is important during independent exercise;Long Term: Able to check pulse independently and accurately       Understanding of Exercise Prescription  Yes       Intervention  Provide education, explanation, and written materials on patient's individual exercise prescription       Expected Outcomes  Short Term: Able to explain program exercise prescription;Long Term: Able to explain home exercise prescription to exercise independently          Exercise Goals Re-Evaluation : Exercise Goals Re-Evaluation    Row Name 09/08/18 0911 09/22/18 1017 09/27/18 1001         Exercise Goal Re-Evaluation   Exercise Goals Review  Increase Physical Activity;Increase Strength and Stamina;Able to understand and use rate of perceived exertion (RPE) scale;Knowledge and understanding of Target Heart Rate Range (THRR);Understanding of Exercise Prescription  -  Increase Physical Activity;Increase Strength and Stamina;Understanding of Exercise Prescription     Comments  Reviewed RPE scale, THR and program prescription with pt today.  Pt voiced understanding and was given a copy of goals to take home.   -  Chelsea Ramsey is off to a good start in rehab.  She has already started to notice that her strength and stamina are starting to come back. She is already walking daily in the morning.  She continues to have  some back pain, but knows strengthening the muscles will help. She also has some sciattica pain in back and legs.  Reviewed home exercise with pt today.  Pt plans to walk at park for exercise.  Reviewed THR, pulse, RPE, sign and symptoms, and when to call 911 or MD.  Also discussed weather considerations and indoor options.  Pt voiced understanding.     Expected Outcomes  Short: Use RPE daily to regulate intensity. Long: Follow program prescription in THR.  -  Short: Continue to walk daily.  Long: Continue to attend regularly and exercise more.         Discharge Exercise Prescription (Final Exercise Prescription Changes): Exercise Prescription Changes - 09/27/18 1000      Response to Exercise   Blood Pressure (Admit)  122/62    Blood Pressure (Exercise)  146/74    Blood Pressure (Exit)  122/62    Heart Rate (Admit)  71 bpm    Heart Rate (Exercise)  121 bpm    Heart Rate (Exit)  103 bpm    Rating of Perceived Exertion (Exercise)  13    Symptoms  none    Duration  Continue with 30 min of aerobic exercise without signs/symptoms of physical distress.    Intensity  THRR unchanged      Progression   Progression  Continue to progress workloads to maintain intensity without signs/symptoms of physical distress.    Average METs  2.66      Resistance Training   Training Prescription  Yes    Weight  3 lbs    Reps  10-15      Interval Training   Interval Training  No      Treadmill   MPH  2.3    Grade  1    Minutes  15    METs  3.08      NuStep   Level  2    Minutes  15    METs  2.9      Biostep-RELP   Level  8    Minutes  15    METs  2      Home Exercise Plan   Plans to continue exercise at  Home (comment)   walk at park   Frequency  Add 3 additional days to program exercise sessions.    Initial Home Exercises Provided  09/27/18       Nutrition:  Target Goals: Understanding of nutrition guidelines, daily intake of sodium <1530m, cholesterol <2057m calories 30% from fat  and 7% or less from saturated fats, daily to have 5 or more servings of fruits and vegetables.  Biometrics: Pre Biometrics - 08/25/18 1459      Pre Biometrics   Height  5' 6.5" (1.689 m)    Weight  258 lb 9.6 oz (117.3 kg)    Waist Circumference  47 inches    Hip Circumference  54 inches    Waist to Hip Ratio  0.87 %    BMI (Calculated)  41.12    Single Leg Stand  30 seconds        Nutrition Therapy Plan and Nutrition Goals: Nutrition Therapy & Goals - 09/22/18 1133      Nutrition Therapy   Diet  TLC  Protein (specify units)  8oz    Fiber  20 grams    Whole Grain Foods  3 servings   does not currently choose whole grains   Saturated Fats  14 max. grams    Fruits and Vegetables  5 servings/day   8 Ideal; does not regularly eat fruits and vegetables   Sodium  1500 grams      Personal Nutrition Goals   Nutrition Goal  Work to decrease soda and candy consumption. The best solution would be not to buy these items in the first place to reduce temptation, but you can also start by setting goals such as drinking one less glass of soda and more more glass of water per day, or choosing applesauce/pudding rather than a candy bar as a snack    Personal Goal #2  Increase fruit and vegetable intake by at least one additional serving per week. Long term goal would be to eat multiple servings of fruits and vegetables daily    Personal Goal #3  Rather than 'nibbling' on foods throughout the day, work to create a structured eating plan that includes meals/snacks with at least two food groups. This will help improve fullness and satiety when you eat and decrease cravings    Comments  Pt reports wt regain s/p recent surgery which she suspects is due to increase in snacking, which has taken the place of smoking (trying to quit). She is choosing candy and chips which has lead to her 'craving' these items more now as well. She has also been snacking/ "nibbling" on foods throughout the day rather than  having sit-down meals. She has however been trying to reduce sodium intake and no longer adds salt to meals. She does not to out to eat often. For beverage she does drink water but also has been drinking juice regularly and finishes a 3L of soda every two days      Intervention Plan   Intervention  Prescribe, educate and counsel regarding individualized specific dietary modifications aiming towards targeted core components such as weight, hypertension, lipid management, diabetes, heart failure and other comorbidities.    Expected Outcomes  Short Term Goal: Understand basic principles of dietary content, such as calories, fat, sodium, cholesterol and nutrients.;Short Term Goal: A plan has been developed with personal nutrition goals set during dietitian appointment.;Long Term Goal: Adherence to prescribed nutrition plan.       Nutrition Assessments: Nutrition Assessments - 08/25/18 1549      MEDFICTS Scores   Pre Score  125       Nutrition Goals Re-Evaluation: Nutrition Goals Re-Evaluation    [place] Name 09/22/18 1147             Goals   Nutrition Goal  Work to decrease soda and candy consumption. The best solution would be not to buy these items in the first place to reduce temptation, but you can also start by setting goals such as drinking one less glass of soda and one more glass of water per day, or choosing applesauce/ pudding rather than a candy bar as a snack       Comment  Currently she drinks 3L of Coke-a-Cola in two days, drinks juices regularly and chooses candy bars and chips as snacks regularly       Expected Outcome  She will slowly decrease SSB and non-nutritious snack intake until they are either eliminated from her diet or consumed only in moderation. She will replace SSB with water and candy/chips with more  nutrient-dense snack options         Personal Goal #2 Re-Evaluation   Personal Goal #2  Increase fruit and vegetable intake by at least one additional serving per  week. Long term goal would be to eat multiple servings of fruits and vegetables daily         Personal Goal #3 Re-Evaluation   Personal Goal #3  Rather than 'nibbling' on foods througout the day, work to create a structured eating plan that includes meals/snacks with at least two food groups. This will help improve fullness and satiety when you eat and decrease cravings          Nutrition Goals Discharge (Final Nutrition Goals Re-Evaluation): Nutrition Goals Re-Evaluation - 09/22/18 1147      Goals   Nutrition Goal  Work to decrease soda and candy consumption. The best solution would be not to buy these items in the first place to reduce temptation, but you can also start by setting goals such as drinking one less glass of soda and one more glass of water per day, or choosing applesauce/ pudding rather than a candy bar as a snack    Comment  Currently she drinks 3L of Coke-a-Cola in two days, drinks juices regularly and chooses candy bars and chips as snacks regularly    Expected Outcome  She will slowly decrease SSB and non-nutritious snack intake until they are either eliminated from her diet or consumed only in moderation. She will replace SSB with water and candy/chips with more nutrient-dense snack options      Personal Goal #2 Re-Evaluation   Personal Goal #2  Increase fruit and vegetable intake by at least one additional serving per week. Long term goal would be to eat multiple servings of fruits and vegetables daily      Personal Goal #3 Re-Evaluation   Personal Goal #3  Rather than 'nibbling' on foods througout the day, work to create a structured eating plan that includes meals/snacks with at least two food groups. This will help improve fullness and satiety when you eat and decrease cravings       Psychosocial: Target Goals: Acknowledge presence or absence of significant depression and/or stress, maximize coping skills, provide positive support system. Participant is able to  verbalize types and ability to use techniques and skills needed for reducing stress and depression.   Initial Review & Psychosocial Screening: Initial Psych Review & Screening - 08/25/18 1543      Initial Review   Current issues with  Current Sleep Concerns;Current Stress Concerns   has been sleeping on the couch since surgery (3 months ago) because of sternal discomfort. Wants to start sleeping in bed.    Source of Stress Concerns  Transportation;Unable to perform yard/household activities;Financial    Comments  Does not drive. Relies on family members to drive. Wants to get back to hobbies/doing house work/ building up stamina.       Family Dynamics   Good Support System?  Yes   mother and aunt     Screening Interventions   Interventions  Encouraged to exercise;Program counselor consult;To provide support and resources with identified psychosocial needs;Provide feedback about the scores to participant    Expected Outcomes  Short Term goal: Utilizing psychosocial counselor, staff and physician to assist with identification of specific Stressors or current issues interfering with healing process. Setting desired goal for each stressor or current issue identified.;Long Term Goal: Stressors or current issues are controlled or eliminated.;Short Term goal: Identification and review  with participant of any Quality of Life or Depression concerns found by scoring the questionnaire.;Long Term goal: The participant improves quality of Life and PHQ9 Scores as seen by post scores and/or verbalization of changes       Quality of Life Scores:  Quality of Life - 08/25/18 1545      Quality of Life   Select  Quality of Life      Quality of Life Scores   Health/Function Pre  16.4 %    Socioeconomic Pre  22.29 %    Psych/Spiritual Pre  18 %    Family Pre  27 %    GLOBAL Pre  19.27 %      Scores of 19 and below usually indicate a poorer quality of life in these areas.  A difference of  2-3 points is  a clinically meaningful difference.  A difference of 2-3 points in the total score of the Quality of Life Index has been associated with significant improvement in overall quality of life, self-image, physical symptoms, and general health in studies assessing change in quality of life.  PHQ-9: Recent Review Flowsheet Data    Depression screen [facility] 2/9 08/25/2018 07/26/2018   Decreased Interest 1 0   Down, Depressed, Hopeless 0 0   PHQ - 2 Score 1 0   Altered sleeping 2 -   Tired, decreased energy 2 -   Change in appetite 1 -   Feeling bad or failure about yourself  0 -   Trouble concentrating 2 -   Moving slowly or fidgety/restless 0 -   Suicidal thoughts 0 -   PHQ-9 Score 8 -   Difficult doing work/chores Not difficult at all -     Interpretation of Total Score  Total Score Depression Severity:  1-4 = Minimal depression, 5-9 = Mild depression, 10-14 = Moderate depression, 15-19 = Moderately severe depression, 20-27 = Severe depression   Psychosocial Evaluation and Intervention: Psychosocial Evaluation - 09/22/18 1039      Psychosocial Evaluation & Interventions   Interventions  Stress management education;Relaxation education;Encouraged to exercise with the program and follow exercise prescription;Therapist referral    Comments  Counselor met with Chelsea Ramsey today [facility]) for initial psychosocial evaluation.  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  Chelsea Ramsey has multiple issues with Fibromyalgia; COPD; CHF: Pulmonary Lung Disease; Osteoarthritis; and OSA in addition to her Cardiac condition.  She has a strong support system with a spouse of 89 years; parents who live locally and active involvement in her local church.  [place] with interrupted sleep and had a sleep study before her surgery and is awaiting a CPAP as a result.  Her appetite is up and down currently.  Chelsea Ramsey reports a long history of depression and anxiety with some panic attacks.  She reports her  Dr. just prescribed new meds for this but they are not working and she "feels worse" with more "irritability."  Counselor encouraged her to let her Dr. know this asap.  She has a trauma history that has never been addressed fully with the loss of an infant at 22 days old (her only child).  Chelsea Ramsey has multiple stressors with her health as well as unresolved grief and trauma.  counselor provided contact info for a therapist locally, and Chelsea Ramsey agreed to pursue this option.  She has goals to lose weight; decrease stress; improve her sleep quality; and increase her stamina and strength while in this  program.      Expected Outcomes  Short:  Chelsea Ramsey will contact a therapist locally to support her journey of grief and unresolved trauma.  She also will exercise for her health and her mental health - to cope with stress better.  Long:  Chelsea Ramsey will make positive self-care choices with diet and exercise to improve her health and mental health.    Continue Psychosocial Services   Follow up required by staff       Psychosocial Re-Evaluation:   Psychosocial Discharge (Final Psychosocial Re-Evaluation):   Vocational Rehabilitation: Provide vocational rehab assistance to qualifying candidates.   Vocational Rehab Evaluation & Intervention: Vocational Rehab - 08/25/18 1542      Initial Vocational Rehab Evaluation & Intervention   Assessment shows need for Vocational Rehabilitation  Yes    Vocational Rehab Packet given to patient  --   paperwork will be given first day of class      Education: Education Goals: Education classes will be provided on a variety of topics geared toward better understanding of heart health and risk factor modification. Participant will state understanding/return demonstration of topics presented as noted by education test scores.  Learning Barriers/Preferences: Learning Barriers/Preferences - 08/25/18 1541      Learning Barriers/Preferences   Learning Barriers  None    Learning  Preferences  Individual Instruction       Education Topics:  AED/CPR: - Group verbal and written instruction with the use of models to demonstrate the basic use of the AED with the basic ABC's of resuscitation.   General Nutrition Guidelines/Fats and Fiber: -Group instruction provided by verbal, written material, models and posters to present the general guidelines for heart healthy nutrition. Gives an explanation and review of dietary fats and fiber.   Controlling Sodium/Reading Food Labels: -Group verbal and written material supporting the discussion of sodium use in heart healthy nutrition. Review and explanation with models, verbal and written materials for utilization of the food label.   Exercise Physiology & General Exercise Guidelines: - Group verbal and written instruction with models to review the exercise physiology of the cardiovascular system and associated critical values. Provides general exercise guidelines with specific guidelines to those with heart or lung disease.    Aerobic Exercise & Resistance Training: - Gives group verbal and written instruction on the various components of exercise. Focuses on aerobic and resistive training programs and the benefits of this training and how to safely progress through these programs..   Flexibility, Balance, Mind/Body Relaxation: Provides group verbal/written instruction on the benefits of flexibility and balance training, including mind/body exercise modes such as yoga, pilates and tai chi.  Demonstration and skill practice provided.   Stress and Anxiety: - Provides group verbal and written instruction about the health risks of elevated stress and causes of high stress.  Discuss the correlation between heart/lung disease and anxiety and treatment options. Review healthy ways to manage with stress and anxiety.   Cardiac Rehab from 09/27/2018 in [facility] Cardiac and Pulmonary Rehab  Date  09/13/18  Educator  [facility]  Instruction  Review Code  1- Verbalizes Understanding      Depression: - Provides group verbal and written instruction on the correlation between heart/lung disease and depressed mood, treatment options, and the stigmas associated with seeking treatment.   Anatomy & Physiology of the Heart: - Group verbal and written instruction and models provide basic cardiac anatomy and physiology, with the coronary electrical and arterial systems. Review of Valvular disease and Heart Failure  Cardiac Procedures: - Group verbal and written instruction to review commonly prescribed medications for heart disease. Reviews the medication, class of the drug, and side effects. Includes the steps to properly store meds and maintain the prescription regimen. (beta blockers and nitrates)   Cardiac Rehab from 09/27/2018 in [facility] Cardiac and Pulmonary Rehab  Date  09/27/18  Educator  [place]  Instruction Review Code  1- Verbalizes Understanding      Cardiac Medications I: - Group verbal and written instruction to review commonly prescribed medications for heart disease. Reviews the medication, class of the drug, and side effects. Includes the steps to properly store meds and maintain the prescription regimen.   Cardiac Medications II: -Group verbal and written instruction to review commonly prescribed medications for heart disease. Reviews the medication, class of the drug, and side effects. (all other drug classes)   Cardiac Rehab from 09/27/2018 in [facility] Cardiac and Pulmonary Rehab  Date  09/08/18  Educator  [place]  Instruction Review Code  1- Verbalizes Understanding       Go Sex-Intimacy & Heart Disease, Get SMART - Goal Setting: - Group verbal and written instruction through game format to discuss heart disease and the return to sexual intimacy. Provides group verbal and written material to discuss and apply goal setting through the application of the S.M.A.R.T. Method.   Cardiac Rehab from 09/27/2018 in [facility] Cardiac and  Pulmonary Rehab  Date  09/27/18  Educator  [place]  Instruction Review Code  1- Verbalizes Understanding      Other Matters of the Heart: - Provides group verbal, written materials and models to describe Stable Angina and Peripheral Artery. Includes description of the disease process and treatment options available to the cardiac patient.   Exercise & Equipment Safety: - Individual verbal instruction and demonstration of equipment use and safety with use of the equipment.   Cardiac Rehab from 09/27/2018 in [facility] Cardiac and Pulmonary Rehab  Date  08/25/18  Educator  [facility]  Instruction Review Code  1- Verbalizes Understanding      Infection Prevention: - Provides verbal and written material to individual with discussion of infection control including proper hand washing and proper equipment cleaning during exercise session.   Cardiac Rehab from 09/27/2018 in [facility] Cardiac and Pulmonary Rehab  Date  08/25/18  Educator  [facility]  Instruction Review Code  1- Verbalizes Understanding      Falls Prevention: - Provides verbal and written material to individual with discussion of falls prevention and safety.   Cardiac Rehab from 09/27/2018 in [facility] Cardiac and Pulmonary Rehab  Date  08/25/18  Educator  [facility]  Instruction Review Code  1- Verbalizes Understanding      Diabetes: - Individual verbal and written instruction to review signs/symptoms of diabetes, desired ranges of glucose level fasting, after meals and with exercise. Acknowledge that pre and post exercise glucose checks will be done for 3 sessions at entry of program.   Know Your Numbers and Risk Factors: -Group verbal and written instruction about important numbers in your health.  Discussion of what are risk factors and how they play a role in the disease process.  Review of Cholesterol, Blood Pressure, Diabetes, and BMI and the role they play in your overall health.   Cardiac Rehab from 09/27/2018 in [facility] Cardiac and Pulmonary Rehab   Date  09/08/18  Educator  [place]  Instruction Review Code  1- Verbalizes Understanding      Sleep Hygiene: -Provides group verbal and written instruction about how sleep  can affect your health.  Define sleep hygiene, discuss sleep cycles and impact of sleep habits. Review good sleep hygiene tips.    Other: -Provides group and verbal instruction on various topics (see comments)   Knowledge Questionnaire Score: Knowledge Questionnaire Score - 08/25/18 1541      Knowledge Questionnaire Score   Pre Score  19/26   correct answers reviewed with [place], focus on exercise, nutrition, and MI      Core Components/Risk Factors/Patient Goals at Admission: Personal Goals and Risk Factors at Admission - 08/25/18 1539      Core Components/Risk Factors/Patient Goals on Admission    Weight Management  Yes;Obesity;Weight Loss    Intervention  Weight Management: Develop a combined nutrition and exercise program designed to reach desired caloric intake, while maintaining appropriate intake of nutrient and fiber, sodium and fats, and appropriate energy expenditure required for the weight goal.;Weight Management: Provide education and appropriate resources to help participant work on and attain dietary goals.;Weight Management/Obesity: Establish reasonable short term and long term weight goals.;Obesity: Provide education and appropriate resources to help participant work on and attain dietary goals.    Admit Weight  258 lb 9.6 oz (117.3 kg)    Goal Weight: Short Term  253 lb (114.8 kg)    Goal Weight: Long Term  200 lb (90.7 kg)    Expected Outcomes  Short Term: Continue to assess and modify interventions until short term weight is achieved;Long Term: Adherence to nutrition and physical activity/exercise program aimed toward attainment of established weight goal;Weight Loss: Understanding of general recommendations for a balanced deficit meal plan, which promotes 1-2 lb weight loss per week and includes a  negative energy balance of [phone] kcal/d;Understanding recommendations for meals to include 15-35% energy as protein, 25-35% energy from fat, 35-60% energy from carbohydrates, less than 226m of dietary cholesterol, 20-35 gm of total fiber daily;Understanding of distribution of calorie intake throughout the day with the consumption of 4-5 meals/snacks    Tobacco Cessation  Yes    Intervention  Assist the participant in steps to quit. Provide individualized education and counseling about committing to Tobacco Cessation, relapse prevention, and pharmacological support that can be provided by physician.;OAdvice worker assist with locating and accessing local/national Quit Smoking programs, and support quit date choice.    Expected Outcomes  Short Term: Will demonstrate readiness to quit, by selecting a quit date.;Short Term: Will quit all tobacco product use, adhering to prevention of relapse plan.;Long Term: Complete abstinence from all tobacco products for at least 12 months from quit date.    Heart Failure  Yes    Intervention  Provide a combined exercise and nutrition program that is supplemented with education, support and counseling about heart failure. Directed toward relieving symptoms such as shortness of breath, decreased exercise tolerance, and extremity edema.    Expected Outcomes  Improve functional capacity of life;Short term: Attendance in program 2-3 days a week with increased exercise capacity. Reported lower sodium intake. Reported increased fruit and vegetable intake. Reports medication compliance.;Short term: Daily weights obtained and reported for increase. Utilizing diuretic protocols set by physician.;Long term: Adoption of self-care skills and reduction of barriers for early signs and symptoms recognition and intervention leading to self-care maintenance.    Hypertension  Yes    Intervention  Provide education on lifestyle modifcations including regular physical  activity/exercise, weight management, moderate sodium restriction and increased consumption of fresh fruit, vegetables, and low fat dairy, alcohol moderation, and smoking cessation.;Monitor prescription use compliance.  Expected Outcomes  Short Term: Continued assessment and intervention until BP is < 140/7m HG in hypertensive participants. < 130/883mHG in hypertensive participants with diabetes, heart failure or chronic kidney disease.;Long Term: Maintenance of blood pressure at goal levels.       Core Components/Risk Factors/Patient Goals Review:  Goals and Risk Factor Review    Row Name 09/27/18 1003             Core Components/Risk Factors/Patient Goals Review   Personal Goals Review  Heart Failure;Weight Management/Obesity;Hypertension;Improve shortness of breath with ADL's       Review  SaJolaine Clicks off to a good start in rehab. She has already started to lose some weight and is down to 156lbs.  She has cut back on soda and candy bars.  She weighs daily and checks blood pressure and saturations daily as well!!  She has been having some shortness of breath and is using her oxygen at night and occasionally during the day. She still has some incisional pain but knows that she is getting better.  Still within her recovery window.  She is doing well with her medicaitons.        Expected Outcomes  Short: Continue to weigh daily and work weight loss.  Long: Continue to manage heart failure.           Core Components/Risk Factors/Patient Goals at Discharge (Final Review):  Goals and Risk Factor Review - 09/27/18 1003      Core Components/Risk Factors/Patient Goals Review   Personal Goals Review  Heart Failure;Weight Management/Obesity;Hypertension;Improve shortness of breath with ADL's    Review  SaJolaine Clicks off to a good start in rehab. She has already started to lose some weight and is down to 156lbs.  She has cut back on soda and candy bars.  She weighs daily and checks blood pressure and  saturations daily as well!!  She has been having some shortness of breath and is using her oxygen at night and occasionally during the day. She still has some incisional pain but knows that she is getting better.  Still within her recovery window.  She is doing well with her medicaitons.     Expected Outcomes  Short: Continue to weigh daily and work weight loss.  Long: Continue to manage heart failure.        ITP Comments: ITP Comments    Row Name 08/25/18 1531 09/07/18 0607 10/05/18 0555       ITP Comments  Med Review completed. Initial ITP created. [facility]  30 day review completed. ITP sent to Dr. MaEmily FilbertMedical Director of Cardiac Rehab. Continue with ITP unless changes are made by physician  Starts sessions on 10/10  30 day review. Continue with ITP unless direccted changes per Medical Director Chart Review.        Comments:

## 2018-10-07 ENCOUNTER — Telehealth: Payer: Self-pay | Admitting: *Deleted

## 2018-10-07 NOTE — Telephone Encounter (Signed)
Call to check on pt. Out since 10/29.  Left message on mobile number listed.

## 2018-10-10 ENCOUNTER — Encounter: Payer: Self-pay | Admitting: Nurse Practitioner

## 2018-10-10 ENCOUNTER — Ambulatory Visit: Payer: Medicaid Other | Attending: Nurse Practitioner | Admitting: Nurse Practitioner

## 2018-10-10 ENCOUNTER — Other Ambulatory Visit: Payer: Self-pay

## 2018-10-10 VITALS — BP 122/84 | HR 107 | Temp 97.3°F | Ht 64.0 in | Wt 257.0 lb

## 2018-10-10 DIAGNOSIS — M899 Disorder of bone, unspecified: Secondary | ICD-10-CM | POA: Insufficient documentation

## 2018-10-10 DIAGNOSIS — M533 Sacrococcygeal disorders, not elsewhere classified: Secondary | ICD-10-CM | POA: Diagnosis not present

## 2018-10-10 DIAGNOSIS — M797 Fibromyalgia: Secondary | ICD-10-CM | POA: Diagnosis not present

## 2018-10-10 DIAGNOSIS — G8929 Other chronic pain: Secondary | ICD-10-CM | POA: Insufficient documentation

## 2018-10-10 DIAGNOSIS — I4891 Unspecified atrial fibrillation: Secondary | ICD-10-CM | POA: Diagnosis not present

## 2018-10-10 DIAGNOSIS — M25561 Pain in right knee: Secondary | ICD-10-CM | POA: Diagnosis not present

## 2018-10-10 DIAGNOSIS — Z7901 Long term (current) use of anticoagulants: Secondary | ICD-10-CM | POA: Diagnosis not present

## 2018-10-10 DIAGNOSIS — Z79891 Long term (current) use of opiate analgesic: Secondary | ICD-10-CM | POA: Insufficient documentation

## 2018-10-10 DIAGNOSIS — G4730 Sleep apnea, unspecified: Secondary | ICD-10-CM | POA: Diagnosis not present

## 2018-10-10 DIAGNOSIS — Z1211 Encounter for screening for malignant neoplasm of colon: Secondary | ICD-10-CM | POA: Insufficient documentation

## 2018-10-10 DIAGNOSIS — Z79899 Other long term (current) drug therapy: Secondary | ICD-10-CM | POA: Insufficient documentation

## 2018-10-10 DIAGNOSIS — G894 Chronic pain syndrome: Secondary | ICD-10-CM | POA: Insufficient documentation

## 2018-10-10 DIAGNOSIS — Z7951 Long term (current) use of inhaled steroids: Secondary | ICD-10-CM | POA: Diagnosis not present

## 2018-10-10 DIAGNOSIS — R0789 Other chest pain: Secondary | ICD-10-CM

## 2018-10-10 DIAGNOSIS — M1711 Unilateral primary osteoarthritis, right knee: Secondary | ICD-10-CM | POA: Insufficient documentation

## 2018-10-10 DIAGNOSIS — M5442 Lumbago with sciatica, left side: Secondary | ICD-10-CM | POA: Diagnosis not present

## 2018-10-10 DIAGNOSIS — F17210 Nicotine dependence, cigarettes, uncomplicated: Secondary | ICD-10-CM | POA: Diagnosis not present

## 2018-10-10 DIAGNOSIS — J449 Chronic obstructive pulmonary disease, unspecified: Secondary | ICD-10-CM | POA: Insufficient documentation

## 2018-10-10 DIAGNOSIS — K219 Gastro-esophageal reflux disease without esophagitis: Secondary | ICD-10-CM | POA: Diagnosis not present

## 2018-10-10 DIAGNOSIS — I5032 Chronic diastolic (congestive) heart failure: Secondary | ICD-10-CM | POA: Insufficient documentation

## 2018-10-10 DIAGNOSIS — M79605 Pain in left leg: Secondary | ICD-10-CM | POA: Diagnosis not present

## 2018-10-10 DIAGNOSIS — I2510 Atherosclerotic heart disease of native coronary artery without angina pectoris: Secondary | ICD-10-CM | POA: Insufficient documentation

## 2018-10-10 DIAGNOSIS — I110 Hypertensive heart disease with heart failure: Secondary | ICD-10-CM | POA: Insufficient documentation

## 2018-10-10 DIAGNOSIS — Z789 Other specified health status: Secondary | ICD-10-CM

## 2018-10-10 NOTE — Patient Instructions (Signed)

## 2018-10-10 NOTE — Progress Notes (Signed)
2wPatient's Name: Chelsea Ramsey  MRN: [id number]  Referring Provider: Garnetta Buddy, MD  DOB: 04-25-1966  PCP: Kathee Delton, MD  DOS: 10/10/2018  Note by: Dionisio David NP  Service setting: Ambulatory outpatient  Specialty: Interventional Pain Management  Location: [facility] (AMB) Pain Management Facility    Patient type: New Patient    Primary Reason(s) for Visit: Initial Patient Evaluation CC: Back Pain  HPI  [name] is a 52 y.o. year old, female patient, who comes today for an initial evaluation. She has Primary localized osteoarthritis of right knee; COPD (chronic obstructive pulmonary disease) with acute bronchitis ([place]); COPD (chronic obstructive pulmonary disease) ([place]); A-fib [facility]); GIB (gastrointestinal bleeding); Intractable vomiting with nausea; Melena; Nausea and vomiting; Acute esophagogastric ulcer; Acute CHF (congestive heart failure) ([place]); Chronic diastolic heart failure ([place]); HTN (hypertension); Chronic bilateral low back pain with left-sided sciatica (Primary Area of Pain); Chronic pain of left lower extremity (Secondary Area of Pain); Sternal pain [facility] Area of Pain); Fibromyalgia (Fourth Area of Pain); Chronic pain of right knee; Chronic sacroiliac joint pain; Chronic pain syndrome; Long term current use of opiate analgesic; Pharmacologic therapy; Disorder of skeletal system; and Problems influencing health status on their problem list.. Her primarily concern today is the Back Pain  Pain Assessment: Location: Lower, Right Back Radiating: pain radiaties across the back down left side and both knee Onset: More than a month ago Duration: Chronic pain Quality: Aching, Burning, Throbbing, Discomfort Severity: 7 /10 (subjective, self-reported pain score)  Note: Reported level is compatible with observation. Clinically the patient looks like a 2/10 A 2/10 is viewed as "Mild to Moderate" and described as noticeable and distracting. Impossible to hide from other  people. More frequent flare-ups. Still possible to adapt and function close to normal. It can be very annoying and may have occasional stronger flare-ups. With discipline, patients may get used to it and adapt. Information on the proper use of the pain scale provided to the patient today. When using our objective Pain Scale, levels between 6 and 10/10 are said to belong in an emergency room, as it progressively worsens from a 6/10, described as severely limiting, requiring emergency care not usually available at an outpatient pain management facility. At a 6/10 level, communication becomes difficult and requires great effort. Assistance to reach the emergency department may be required. Facial flushing and profuse sweating along with potentially dangerous increases in heart rate and blood pressure will be evident. Effect on ADL: limits my daily activities Timing: Constant Modifying factors: laying down BP: 122/84  HR: (!) 107  Onset and Duration: Date of onset: over 3 years Cause of pain: Unknown Severity: Getting worse, NAS-11 at its worse: 10/10, NAS-11 at its best: 6/10, NAS-11 now: 7/10 and NAS-11 on the average: 7/10 Timing: Not influenced by the time of the day Aggravating Factors: Bending, Climbing, Intercourse (sex), Kneeling, Lifiting, Motion, Prolonged sitting, Prolonged standing, Squatting, Stooping , Twisting, Walking and Walking uphill Alleviating Factors: Medications Associated Problems: Numbness, Spasms, Swelling, Tingling, Weakness and Pain that wakes patient up Quality of Pain: Agonizing, Annoying, Burning, Deep, Disabling, Dreadful, Feeling of constriction, Horrible, Nagging, Sharp, Shooting, Throbbing and Uncomfortable Previous Examinations or Tests: CT scan, Endoscopy, MRI scan, Nerve block, X-rays, Nerve conduction test, Neurological evaluation, Neurosurgical evaluation and Orthopedic evaluation Previous Treatments: Narcotic medications  The patient comes into the clinics today  for the first time for a chronic pain management evaluation.  Adding to the patient her primary area of pain is in her lower  back.  She admits that the left side is greater than the right.  She denies any previous surgery.  She admits that she has had some injections in the past however they were not effective.  She is currently doing cardiac rehab physical therapy.  She admits that she has had recent images.  Her second area of pain is in her leg.  She admits that she has numbness and weakness.  She describes her leg is going daily.  She denies any problems in her right leg.  Third area of pain is in her sternum.  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  She continues to have incisional pain secondary to keloid.  Her fourth area of pain is generalized.  She admits that she has fibromyalgia.  Her fifth area of pain is in her right knee.  She is status post right total knee replacement this was completed by [name] in [place].  She is currently doing physical therapy with cardiac rehab.  She is unsure about recent images.  Today I took the time to provide the patient with information regarding this pain practice. The patient was informed that the practice is divided into two sections: an interventional pain management section, as well as a completely separate and distinct medication management section. I explained that there are procedure days for interventional therapies, and evaluation days for follow-ups and medication management. Because of the amount of documentation required during both, they are kept separated. This means that there is the possibility that she may be scheduled for a procedure on one day, and medication management the next. I have also informed her that because of staffing and facility limitations, this practice will no longer take patients for medication management only. To illustrate the reasons for this, I gave the patient the example of surgeons, and how  inappropriate it would be to refer a patient to his/her care, just to write for the post-surgical antibiotics on a surgery done by a different surgeon.   Because interventional pain management is part of the board-certified specialty for the doctors, the patient was informed that joining this practice means that they are open to any and all interventional therapies. I made it clear that this does not mean that they will be forced to have any procedures done. What this means is that I believe interventional therapies to be essential part of the diagnosis and proper management of chronic pain conditions. Therefore, patients not interested in these interventional alternatives will be better served under the care of a different practitioner.  The patient was also made aware of my Comprehensive Pain Management Safety Guidelines where by joining this practice, they limit all of their nerve blocks and joint injections to those done by our practice, for as long as we are retained to manage their care. Historic Controlled Substance Pharmacotherapy Review  PMP and historical list of controlled substances: Hydrocodone/acetaminophen 10/325 mg, oxycodone/acetaminophen 5/325 mg, oxycodone 5 mg, hydrocodone/Chlorphen ER suspension, diazepam 5 mg, diazepam 2 mg, tramadol 50 mg, Suboxone 2 mg / 0.5 mg sublingual film, zolpidem 10 mg, Highest opioid analgesic regimen found: oxycodone/acetaminophen 5/325 every 4 hours (fill date 09/10/2016) oxycodone 60 mg/day Most recent opioid analgesic: Hydrocodone/acetaminophen 10/325 1 tablet daily (fill date 09/23/2018) hydrocodone 10 mg/day Current opioid analgesics:  Hydrocodone/acetaminophen 10/325 1 tablet daily (fill date 09/23/2018) hydrocodone 10 mg/day Highest recorded MME/day: 22m/day MME/day: 10 mg/day Medications: The patient did not bring the medication(s) to the appointment, as requested in our "New Patient Package"  Pharmacodynamics: Desired effects: Analgesia: The  patient reports >50% benefit. Reported improvement in function: The patient reports medication allows her to accomplish basic ADLs. Clinically meaningful improvement in function (CMIF): Sustained CMIF goals met Perceived effectiveness: Described as relatively effective, allowing for increase in activities of daily living (ADL) Undesirable effects: Side-effects or Adverse reactions: None reported Historical Monitoring: The patient  reports that she has current or past drug history. List of all UDS Test(s): Lab Results  Component Value Date   MDMA NEGATIVE 03/28/2013   MDMA NEGATIVE 06/14/2012   MDMA NEGATIVE 06/01/2012   COCAINSCRNUR NONE DETECTED 09/04/2016   COCAINSCRNUR NEGATIVE 03/28/2013   COCAINSCRNUR NEGATIVE 06/14/2012   COCAINSCRNUR NEGATIVE 06/01/2012   PCPSCRNUR NEGATIVE 03/28/2013   PCPSCRNUR NEGATIVE 06/14/2012   PCPSCRNUR NEGATIVE 06/01/2012   THCU POSITIVE (A) 09/04/2016   THCU POSITIVE 03/28/2013   THCU POSITIVE 06/14/2012   THCU POSITIVE 06/01/2012   List of all Serum Drug Screening Test(s):  No results found for: AMPHSCRSER, BARBSCRSER, BENZOSCRSER, COCAINSCRSER, PCPSCRSER, PCPQUANT, THCSCRSER, CANNABQUANT, OPIATESCRSER, OXYSCRSER, PROPOXSCRSER Historical Background Evaluation: [place]: Six (6) year initial data search conducted.       A pattern of multiple prescribers and multiple pharmacies was identifiedNAR X scores narcotics 541, sedatives 280, stimulants 0, overdose risk score 600 [place] Department of public safety, offender search: Editor, commissioning Information) No criminal record(s) found session of schedule II medications 2002 Risk Assessment Profile: Aberrant behavior: None observed or detected today Risk factors for fatal opioid overdose: age 24-2 years old and drug-related convictions or arrests Fatal overdose hazard ratio (HR): Calculation deferred Non-fatal overdose hazard ratio (HR): Calculation deferred Risk of opioid abuse or dependence: 0.7-3.0% with doses ?  36 MME/day and 6.1-26% with doses ? 120 MME/day. Substance use disorder (SUD) risk level: Pending results of Medical Psychology Evaluation for SUD Opioid risk tool (ORT) (Total Score): 3  ORT Scoring interpretation table:  Score <3 = Low Risk for SUD  Score between 4-7 = Moderate Risk for SUD  Score >8 = High Risk for Opioid Abuse   PHQ-2 Depression Scale:  Total score:    PHQ-2 Scoring interpretation table: (Score and probability of major depressive disorder)  Score 0 = No depression  Score 1 = 15.4% Probability  Score 2 = 21.1% Probability  Score 3 = 38.4% Probability  Score 4 = 45.5% Probability  Score 5 = 56.4% Probability  Score 6 = 78.6% Probability   PHQ-9 Depression Scale:  Total score:    PHQ-9 Scoring interpretation table:  Score 0-4 = No depression  Score 5-9 = Mild depression  Score 10-14 = Moderate depression  Score 15-19 = Moderately severe depression  Score 20-27 = Severe depression (2.4 times higher risk of SUD and 2.89 times higher risk of overuse)   Pharmacologic Plan: Pending ordered tests and/or consults  Meds  The patient has a current medication list which includes the following prescription(s): albuterol, bisacodyl, budesonide-formoterol, bumetanide, diltiazem, ferrous sulfate, gabapentin, hydrocodone-acetaminophen, ipratropium, ipratropium-albuterol, lactulose, magnesium oxide, metoprolol tartrate, omeprazole, rivaroxaban, tizanidine, and topiramate.  Current Outpatient Medications on File Prior to Visit  Medication Sig  . albuterol (PROVENTIL HFA;VENTOLIN HFA) 108 (90 Base) MCG/ACT inhaler Inhale into the lungs.  . bisacodyl (DULCOLAX) 5 MG EC tablet Take 2 tablets (10 mg total) by mouth daily as needed for moderate constipation.  . budesonide-formoterol (SYMBICORT) 160-4.5 MCG/ACT inhaler Inhale 2 puffs into the lungs 2 (two) times daily.  . bumetanide (BUMEX) 2 MG tablet Take 2 tablets (4 mg total) by mouth daily. (Patient  taking differently: Take 2  mg by mouth daily. )  . diltiazem (CARDIZEM CD) 240 MG 24 hr capsule Take 1 capsule (240 mg total) by mouth daily.  . ferrous sulfate 325 (65 FE) MG tablet Take 325 mg by mouth daily.  Marland Kitchen gabapentin (NEURONTIN) 300 MG capsule Take 600 mg by mouth 3 (three) times daily.   Marland Kitchen HYDROcodone-acetaminophen (NORCO) 10-325 MG tablet Take 1 tablet by mouth daily as needed.  Marland Kitchen ipratropium (ATROVENT) 0.02 % nebulizer solution Inhale into the lungs.  Marland Kitchen ipratropium-albuterol (DUONEB) 0.5-2.5 (3) MG/3ML SOLN Inhale 3 mLs into the lungs every 8 (eight) hours as needed (respiratory).   Marland Kitchen lactulose (CHRONULAC) 10 GM/15ML solution Take 45 mLs (30 g total) by mouth 2 (two) times daily as needed for mild constipation.  . magnesium oxide (MAG-OX) 400 MG tablet Take 400 mg by mouth 2 (two) times daily.  . metoprolol tartrate (LOPRESSOR) 25 MG tablet Take 1 tablet (25 mg total) by mouth 2 (two) times daily.  Marland Kitchen omeprazole (PRILOSEC) 40 MG capsule Take 40 mg by mouth 2 (two) times daily.  . rivaroxaban (XARELTO) 20 MG TABS tablet Take 20 mg by mouth daily.  Marland Kitchen tiZANidine (ZANAFLEX) 4 MG tablet Take by mouth.  . topiramate (TOPAMAX) 25 MG tablet Take by mouth.   No current facility-administered medications on file prior to visit.    Imaging Review  Shoulder Imaging:  Shoulder-R DG:  Results for orders placed during the hospital encounter of 06/08/15  DG Shoulder Right   Narrative CLINICAL DATA:  Right shoulder pain x6 days  EXAM: RIGHT SHOULDER - 2+ VIEW  COMPARISON:  None.  FINDINGS: No fracture or dislocation is seen.  Mild degenerative changes at the glenohumeral and acromioclavicular joints.  Visualized right lung is grossly clear.  IMPRESSION: No fracture or dislocation is seen.  Mild degenerative changes.   Electronically Signed   By: Julian Hy M.D.   On: 06/08/2015 12:15   Lumbosacral Imaging: Lumbar MR wo contrast:  Results for orders placed during the hospital encounter of  01/05/18  MR LUMBAR SPINE WO CONTRAST   Narrative CLINICAL DATA:  Lumbar radiculitis. Low back pain for 6 months that is worsening.  EXAM: MRI LUMBAR SPINE WITHOUT CONTRAST  TECHNIQUE: Multiplanar, multisequence MR imaging of the lumbar spine was performed. No intravenous contrast was administered.  COMPARISON:  06/05/2017  FINDINGS: Segmentation:  Standard.  Alignment:  Physiologic.  Vertebrae: No fracture, evidence of discitis, or aggressive bone lesion. L5 hemangioma.  Conus medullaris and cauda equina: Conus extends to the L1 level. Conus and cauda equina appear normal.  Paraspinal and other soft tissues: Negative  Disc levels:  T12- L1: Unremarkable.  L1-L2: Mild spondylosis.  No impingement  L2-L3: Unremarkable.  L3-L4: Stable mild disc bulging most notable at the foramina. Mild epidural fat expansion. No impingement  L4-L5: Stable disc bulging and left foraminal annular fissure. Thecal sac partial effacement from epidural fat expansion that is progressed. No degenerative impingement.  L5-S1:No significant degenerative changes. Conjoined root sleeve on the left.  IMPRESSION: 1. Mild disc degeneration without progression since July 2018 comparison. No degenerative impingement. 2. Epidural fat expansion that has progressed from 2018, with moderate thecal sac effacement at L4-5.   Electronically Signed   By: Monte Fantasia M.D.   On: 01/05/2018 14:31   Knee Imaging:  Results for orders placed during the hospital encounter of 07/05/15  DG Knee 2 Views Right   Narrative CLINICAL DATA:  Nontraumatic pain and swelling for 4  days.  EXAM: RIGHT KNEE - 1-2 VIEW  COMPARISON:  01/28/2015  FINDINGS: Moderate osteoarthritic changes are present involving all compartments without significant change from 01/28/2015. There is no bone lesion or bony destruction. No fracture or other acute abnormality is  evident.  IMPRESSION: Osteoarthritis.   Electronically Signed   By: Andreas Newport M.D.   On: 07/05/2015 21:19    Knee-L DG 1-2 views:  Results for orders placed during the hospital encounter of 07/05/15  DG Knee 2 Views Left   Narrative CLINICAL DATA:  Left knee pain and swelling for 4 days. No known injury.  EXAM: LEFT KNEE - 1-2 VIEW  COMPARISON:  11/02/2014.  FINDINGS: No fracture or bone lesion. There is minor marginal spurring from the medial compartment. No other arthropathic change. No convincing joint effusion. Soft tissues are unremarkable.  IMPRESSION: 1. No fracture or acute finding. 2. Minor medial joint space compartment osteoarthritis.   Electronically Signed   By: Lajean Manes M.D.   On: 07/05/2015 21:18    . Knee-R DG 4 views:  Results for orders placed in visit on 06/08/00  DG Knee Complete 4 Views Right   Narrative FINDINGS INDICATION:  FELL ON KNEE THIS A.M. W/PAIN ALONG MEDIAL MALLEOLUS. 4V RIGHT KNEE: NO SIGNIFICANT JOINT EFFUSION IS IDENTIFIED.  THERE ARE NO OBVIOUS FRACTURES OR DISLOCATIONS.  WELL- CORTICATED OSSIFICATION VERSUS CALCIFICATION IS SEEN ALONG THE MEDIAL MALLEOLUS.  THERE IS ALSO MILD SPURRING DEMONSTRATED AT THE TIBIAL SPINES, SUGGESTING MILD DEGENERATIVE CHANGE. IMPRESSION MILD DEGENERATIVE CHANGE.  NO OBVIOUS FRACTURES OR DISLOCATIONS.   Note: Available results from prior imaging studies were reviewed.        ROS  Cardiovascular History: Heart trouble, High blood pressure, Heart surgery, Heart failure, Weak heart (CHF), Heart valve problems and Heart catheterization Pulmonary or Respiratory History: Wheezing and difficulty taking a deep full breath (Asthma) and Difficulty blowing air out (Emphysema) Neurological History: No reported neurological signs or symptoms such as seizures, abnormal skin sensations, urinary and/or fecal incontinence, being born with an abnormal open spine and/or a tethered spinal cord Review of  Past Neurological Studies:  Results for orders placed or performed during the hospital encounter of 04/22/18  CT HEAD WO CONTRAST   Narrative   CLINICAL DATA:  Left sided headache x 48hrs, following episode of A-Fib. Now the headache is radiating to the right side. Also notes, blurred vision and dizziness. Family hx of strokes.  EXAM: CT HEAD WITHOUT CONTRAST  TECHNIQUE: Contiguous axial images were obtained from the base of the skull through the vertex without intravenous contrast.  COMPARISON:  Brain MRI, 12/17/2013.  FINDINGS: Brain: No evidence of acute infarction, hemorrhage, hydrocephalus, extra-axial collection or mass lesion/mass effect.  Vascular: No hyperdense vessel or unexpected calcification.  Skull: Normal. Negative for fracture or focal lesion.  Sinuses/Orbits: Visualize globes and orbits are unremarkable. The visualized sinuses and mastoid air cells are clear.  Other: None.  IMPRESSION: Normal unenhanced CT scan of the brain   Electronically Signed   By: Lajean Manes M.D.   On: 04/24/2018 12:51    Psychological-Psychiatric History: Anxiousness, Prone to panicking and Difficulty sleeping and or falling asleep Gastrointestinal History: No reported gastrointestinal signs or symptoms such as vomiting or evacuating blood, reflux, heartburn, alternating episodes of diarrhea and constipation, inflamed or scarred liver, or pancreas or irrregular and/or infrequent bowel movements Genitourinary History: No reported renal or genitourinary signs or symptoms such as difficulty voiding or producing urine, peeing blood, non-functioning kidney, kidney stones, difficulty emptying  the bladder, difficulty controlling the flow of urine, or chronic kidney disease Hematological History: No reported hematological signs or symptoms such as prolonged bleeding, low or poor functioning platelets, bruising or bleeding easily, hereditary bleeding problems, low energy levels due to low  hemoglobin or being anemic Endocrine History: No reported endocrine signs or symptoms such as high or low blood sugar, rapid heart rate due to high thyroid levels, obesity or weight gain due to slow thyroid or thyroid disease Rheumatologic History: Generalized muscle aches (Fibromyalgia) Musculoskeletal History: Negative for myasthenia gravis, muscular dystrophy, multiple sclerosis or malignant hyperthermia Work History: Disabled  Allergies  Ms. Stang is allergic to amiodarone; aspirin; flexeril [cyclobenzaprine]; morphine and related; trazamine [trazodone & diet manage prod]; codeine; and tramadol.  Laboratory Chemistry  Inflammation Markers Lab Results  Component Value Date   ESRSEDRATE 1 12/16/2013   (CRP: Acute Phase) (ESR: Chronic Phase) Renal Function Markers Lab Results  Component Value Date   BUN 9 07/21/2018   CREATININE 0.50 07/21/2018   GFRAA >60 07/21/2018   GFRNONAA >60 07/21/2018   Hepatic Function Markers Lab Results  Component Value Date   AST 21 05/22/2018   ALT 22 05/22/2018   ALBUMIN 3.7 05/22/2018   ALKPHOS 96 05/22/2018   Electrolytes Lab Results  Component Value Date   NA 139 07/21/2018   K 3.8 07/21/2018   CL 98 07/21/2018   CALCIUM 8.3 (L) 07/21/2018   MG 1.6 (L) 07/20/2018   Neuropathy Markers No results found for: [id number] Bone Pathology Markers Lab Results  Component Value Date   ALKPHOS 96 05/22/2018   CALCIUM 8.3 (L) 07/21/2018   Coagulation Parameters Lab Results  Component Value Date   INR 1.04 05/22/2018   LABPROT 13.5 05/22/2018   APTT 26 05/22/2018   PLT 178 07/18/2018   Cardiovascular Markers Lab Results  Component Value Date   BNP 371.0 (H) 07/17/2018   HGB 8.3 (L) 07/18/2018   HCT 25.9 (L) 07/18/2018   Note: Lab results reviewed.  [place]  Drug: Ms. Yousuf  reports that she has current or past drug history. Alcohol:  reports that she drank about 3.0 standard drinks of alcohol per week. Tobacco:  reports that  she has been smoking cigarettes. She has been smoking about 0.50 packs per day. She has never used smokeless tobacco. Medical:  has a past medical history of Allergy, Anxiety, Arthritis, Asthma, CHF (congestive heart failure) ([place]), COPD (chronic obstructive pulmonary disease) ([place]), Coronary artery disease, Fibromyalgia, GERD (gastroesophageal reflux disease), Hypertension, Pneumonia, PUD (peptic ulcer disease), Pulmonary HTN ([place]), Rheumatic fever/heart disease, and Sleep apnea. Family: family history includes Asthma in her mother; COPD in her mother; Cancer in her mother; Congestive Heart Failure in her father.  Past Surgical History:  Procedure Laterality Date  . ADENOIDECTOMY    . ESOPHAGOGASTRODUODENOSCOPY (EGD) WITH PROPOFOL N/A 05/25/2018   Procedure: ESOPHAGOGASTRODUODENOSCOPY (EGD) WITH PROPOFOL;  Surgeon: Lucilla Lame, MD;  Location: [facility] ENDOSCOPY;  Service: Endoscopy;  Laterality: N/A;  . MITRAL VALVE REPLACEMENT    . MULTIPLE TOOTH EXTRACTIONS    . TONSILLECTOMY    . TOTAL KNEE ARTHROPLASTY Right 09/04/2016   Procedure: TOTAL KNEE ARTHROPLASTY; with lateral release;  Surgeon: Earlie Server, MD;  Location: [place];  Service: Orthopedics;  Laterality: Right;   Active Ambulatory Problems    Diagnosis Date Noted  . Primary localized osteoarthritis of right knee 09/04/2016  . COPD (chronic obstructive pulmonary disease) with acute bronchitis ([place]) 12/20/2017  . COPD (chronic obstructive pulmonary disease) ([place]) 12/21/2017  .  A-fib ([place]) 04/22/2018  . GIB (gastrointestinal bleeding) 05/22/2018  . Intractable vomiting with nausea   . Melena   . Nausea and vomiting   . Acute esophagogastric ulcer   . Acute CHF (congestive heart failure) ([place]) 07/17/2018  . Chronic diastolic heart failure ([place]) 07/26/2018  . HTN (hypertension) 07/26/2018  . Chronic bilateral low back pain with left-sided sciatica (Primary Area of Pain) 10/10/2018  . Chronic pain of left lower extremity (Secondary Area  of Pain) 10/10/2018  . Sternal pain (Tertiary Area of Pain) 10/10/2018  . Fibromyalgia (Fourth Area of Pain) 10/10/2018  . Chronic pain of right knee 10/10/2018  . Chronic sacroiliac joint pain 10/10/2018  . Chronic pain syndrome 10/10/2018  . Long term current use of opiate analgesic 10/10/2018  . Pharmacologic therapy 10/10/2018  . Disorder of skeletal system 10/10/2018  . Problems influencing health status 10/10/2018   Resolved Ambulatory Problems    Diagnosis Date Noted  . No Resolved Ambulatory Problems   Past Medical History:  Diagnosis Date  . Allergy   . Anxiety   . Arthritis   . Asthma   . CHF (congestive heart failure) ([place])   . Coronary artery disease   . GERD (gastroesophageal reflux disease)   . Hypertension   . Pneumonia   . PUD (peptic ulcer disease)   . Pulmonary HTN ([place])   . Rheumatic fever/heart disease   . Sleep apnea    Constitutional Exam  General appearance: Well nourished, well developed, and well hydrated. In no apparent acute distress Vitals:   10/10/18 1347  BP: 122/84  Pulse: (!) 107  Temp: (!) 97.3 F (36.3 C)  SpO2: 100%  Weight: 257 lb (116.6 kg)  Height: 5' 4"  (1.626 m)   BMI Assessment: Estimated body mass index is 44.11 kg/m as calculated from the following:   Height as of this encounter: 5' 4"  (1.626 m).   Weight as of this encounter: 257 lb (116.6 kg).  BMI interpretation table: BMI level Category Range association with higher incidence of chronic pain  <18 kg/m2 Underweight   18.5-24.9 kg/m2 Ideal body weight   25-29.9 kg/m2 Overweight Increased incidence by 20%  30-34.9 kg/m2 Obese (Class I) Increased incidence by 68%  35-39.9 kg/m2 Severe obesity (Class II) Increased incidence by 136%  >40 kg/m2 Extreme obesity (Class III) Increased incidence by 254%   BMI Readings from Last 4 Encounters:  10/10/18 44.11 kg/m  08/25/18 41.11 kg/m  07/26/18 45.83 kg/m  07/21/18 47.70 kg/m   Wt Readings from Last 4 Encounters:   10/10/18 257 lb (116.6 kg)  08/25/18 258 lb 9.6 oz (117.3 kg)  07/26/18 267 lb (121.1 kg)  07/21/18 277 lb 14.4 oz (126.1 kg)  Psych/Mental status: Alert, oriented x 3 (person, place, & time)       Eyes: PERLA Respiratory: No evidence of acute respiratory distress  Cervical Spine Exam  Inspection: No masses, redness, or swelling Alignment: Symmetrical Functional ROM: Unrestricted ROM      Stability: No instability detected Muscle strength & Tone: Functionally intact Sensory: Unimpaired Palpation: No palpable anomalies              Upper Extremity (UE) Exam    Side: Right upper extremity  Side: Left upper extremity  Inspection: No masses, redness, swelling, or asymmetry. No contractures  Inspection: No masses, redness, swelling, or asymmetry. No contractures  Functional ROM: Unrestricted ROM          Functional ROM: Unrestricted ROM  Muscle strength & Tone: Functionally intact  Muscle strength & Tone: Functionally intact  Sensory: Unimpaired  Sensory: Unimpaired  Palpation: No palpable anomalies              Palpation: No palpable anomalies              Specialized Test(s): Deferred         Specialized Test(s): Deferred          Thoracic Spine Exam  Inspection: No masses, redness, or swelling Alignment: Symmetrical Functional ROM: Unrestricted ROM Stability: No instability detected Sensory: Unimpaired Muscle strength & Tone: No palpable anomalies  Lumbar Spine Exam  Inspection: No masses, redness, or swelling Alignment: Symmetrical Functional ROM: Unrestricted ROM      Stability: No instability detected Muscle strength & Tone: Functionally intact Sensory: Unimpaired Palpation: Complains of area being tender to palpation       Provocative Tests: Lumbar Hyperextension and rotation test: Unable to perform due to pain. Patrick's Maneuver: Positive                    Gait & Posture Assessment  Ambulation: Unassisted Gait: Antalgic Posture: Antalgic   Lower  Extremity Exam    Side: Right lower extremity  Side: Left lower extremity  Inspection: Healed scar from previous surgery, swelling  Inspection: No masses, redness, swelling, or asymmetry. No contractures  Functional ROM: Unrestricted ROM          Functional ROM: Unrestricted ROM          Muscle strength & Tone: Functionally intact  Muscle strength & Tone: Functionally intact  Sensory: Unimpaired  Sensory: Unimpaired  Palpation: Complains of area being tender to palpation  Palpation: No palpable anomalies   Assessment  Primary Diagnosis & Pertinent Problem List: The primary encounter diagnosis was Chronic bilateral low back pain with left-sided sciatica (Primary Area of Pain). Diagnoses of Chronic pain of left lower extremity (Secondary Area of Pain), Sternal pain (Tertiary Area of Pain), Fibromyalgia (Fourth Area of Pain), Chronic pain of right knee, Chronic sacroiliac joint pain, Chronic pain syndrome, Long term current use of opiate analgesic, Pharmacologic therapy, Disorder of skeletal system, and Problems influencing health status were also pertinent to this visit.  Visit Diagnosis: 1. Chronic bilateral low back pain with left-sided sciatica (Primary Area of Pain)   2. Chronic pain of left lower extremity (Secondary Area of Pain)   3. Sternal pain (Tertiary Area of Pain)   4. Fibromyalgia (Fourth Area of Pain)   5. Chronic pain of right knee   6. Chronic sacroiliac joint pain   7. Chronic pain syndrome   8. Long term current use of opiate analgesic   9. Pharmacologic therapy   10. Disorder of skeletal system   11. Problems influencing health status    Plan of Care  Initial treatment plan:  Please be advised that as per protocol, today's visit has been an evaluation only. We have not taken over the patient's controlled substance management.  Problem-specific plan: No problem-specific Assessment & Plan notes found for this encounter.  Ordered Lab-work, Procedure(s), Referral(s), &  Consult(s): Orders Placed This Encounter  Procedures  . DG Knee 1-2 Views Right  . DG Si Joints  . Compliance Drug Analysis, Ur  . Comp. Metabolic Panel (12)  . Magnesium  . Vitamin B12  . Sedimentation rate  . 25-Hydroxyvitamin D Lcms D2+D3  . C-reactive protein   Pharmacotherapy: Medications ordered:  No orders of the defined types were placed in  this encounter.  Medications administered during this visit: Zhane C. Lachney had no medications administered during this visit.   Pharmacotherapy under consideration:  Opioid Analgesics: The patient was informed that there is no guarantee that she would be a candidate for opioid analgesics. The decision will be made following CDC guidelines. This decision will be based on the results of diagnostic studies, as well as Ms. Klumb's risk profile.  Membrane stabilizer: To be determined at a later time Muscle relaxant: To be determined at a later time NSAID: To be determined at a later time Other analgesic(s): To be determined at a later time   Interventional therapies under consideration: Ms. Pulley was informed that there is no guarantee that she would be a candidate for interventional therapies. The decision will be based on the results of diagnostic studies, as well as Ms. Montagna's risk profile.  Possible procedure(s): Diagnostic bilateral lumbar facet nerve blocks Diagnostic bilateral lumbar facet radiofrequency ablation Trigger point injections Diagnostic right knee genicular nerve block Possible right knee genicular nerve radiofrequency ablation   Provider-requested follow-up: Return for 2nd Visit, w/ Dr. Dossie Arbour.  Future Appointments  Date Time Provider [place]  10/11/2018  9:05 AM [facility] PHASE II EXC [facility] None  10/13/2018  9:05 AM [facility] PHASE II EXC [facility] None  10/18/2018  9:05 AM [facility] PHASE II EXC [facility] None  10/20/2018  9:05 AM [facility] PHASE II EXC [facility] None   10/25/2018  9:05 AM [facility] PHASE II EXC [facility] None  10/31/2018 11:30 AM Milinda Pointer, MD [facility] None  11/01/2018  9:05 AM [facility] PHASE II EXC [facility] None  11/03/2018  9:05 AM [facility] PHASE II EXC [facility] None  11/08/2018  9:05 AM [facility] PHASE II EXC [facility] None  11/10/2018  9:05 AM [facility] PHASE II EXC [facility] None  11/15/2018  9:05 AM [facility] PHASE II EXC [facility] None  11/17/2018  9:05 AM [facility] PHASE II EXC [facility] None  11/22/2018  9:05 AM [facility] PHASE II EXC [facility] None  11/24/2018  9:05 AM [facility] PHASE II EXC [facility] None    Primary Care Physician: Kathee Delton, MD Location: [facility] Note by:  Date: 10/10/2018; Time: 4:12 PM  Pain Score Disclaimer: We use the NRS-11 scale. This is a self-reported, subjective measurement of pain severity with only modest accuracy. It is used primarily to identify changes within a particular patient. It must be understood that outpatient pain scales are significantly less accurate that those used for research, where they can be applied under ideal controlled circumstances with minimal exposure to variables. In reality, the score is likely to be a combination of pain intensity and pain affect, where pain affect describes the degree of emotional arousal or changes in action readiness caused by the sensory experience of pain. Factors such as social and work situation, setting, emotional state, anxiety levels, expectation, and prior pain experience may influence pain perception and show large inter-individual differences that may also be affected by time variables.  Patient instructions provided during this appointment: Patient Instructions   ____________________________________________________________________________________________  Appointment Policy Summary  It is our goal and responsibility to provide the medical community with assistance in  the evaluation and management of patients with chronic pain. Unfortunately our resources are limited. Because we do not have an unlimited amount of time, or available appointments, we are required to closely monitor and manage their use. The following rules exist to maximize their use:  Patient's responsibilities: 1. Punctuality:  At what time should I arrive?  You should be physically present in our office 30 minutes before your scheduled appointment. Your scheduled appointment is with your assigned healthcare provider. However, it takes 5-10 minutes to be "checked-in", and another 15 minutes for the nurses to do the admission. If you arrive to our office at the time you were given for your appointment, you will end up being at least 20-25 minutes late to your appointment with the provider. 2. Tardiness:  What happens if I arrive only a few minutes after my scheduled appointment time? You will need to reschedule your appointment. The cutoff is your appointment time. This is why it is so important that you arrive at least 30 minutes before that appointment. If you have an appointment scheduled for 10:00 AM and you arrive at 10:01, you will be required to reschedule your appointment.  3. Plan ahead:  Always assume that you will encounter traffic on your way in. Plan for it. If you are dependent on a driver, make sure they understand these rules and the need to arrive early. 4. Other appointments and responsibilities:  Avoid scheduling any other appointments before or after your pain clinic appointments.  5. Be prepared:  Write down everything that you need to discuss with your healthcare provider and give this information to the admitting nurse. Write down the medications that you will need refilled. Bring your pills and bottles (even the empty ones), to all of your appointments, except for those where a procedure is scheduled. 6. No children or pets:  Find someone to take care of them. It is not  appropriate to bring them in. 7. Scheduling changes:  We request "advanced notification" of any changes or cancellations. 8. Advanced notification:  Defined as a time period of more than 24 hours prior to the originally scheduled appointment. This allows for the appointment to be offered to other patients. 9. Rescheduling:  When a visit is rescheduled, it will require the cancellation of the original appointment. For this reason they both fall within the category of "Cancellations".  10. Cancellations:  They require advanced notification. Any cancellation less than 24 hours before the  appointment will be recorded as a "No Show". 11. No Show:  Defined as an unkept appointment where the patient failed to notify or declare to the practice their intention or inability to keep the appointment.  Corrective process for repeat offenders:  1. Tardiness: Three (3) episodes of rescheduling due to late arrivals will be recorded as one (1) "No Show". 2. Cancellation or reschedule: Three (3) cancellations or rescheduling will be recorded as one (1) "No Show". 3. "No Shows": Three (3) "No Shows" within a 12 month period will result in discharge from the practice. ____________________________________________________________________________________________   ______________________________________________________________________________________________  Specialty Pain Scale  Introduction:  There are significant differences in how pain is reported. The word pain usually refers to physical pain, but it is also a common synonym of suffering. The medical community uses a scale from 0 (zero) to 10 (ten) to report pain level. Zero (0) is described as "no pain", while ten (10) is described as "the worse pain you can imagine". The problem with this scale is that physical pain is reported along with suffering. Suffering refers to mental pain, or more often yet it refers to any unpleasant feeling, emotion or aversion  associated with the perception of harm or threat of harm. It is the psychological component of pain.  Pain Specialists prefer to separate the two components. The pain scale used by this  practice is the Verbal Numerical Rating Scale (VNRS-11). This scale is for the physical pain only. DO NOT INCLUDE how your pain psychologically affects you. This scale is for adults 7 years of age and older. It has 11 (eleven) levels. The 1st level is 0/10. This means: "right now, I have no pain". In the context of pain management, it also means: "right now, my physical pain is under control with the current therapy".  General Information:  The scale should reflect your current level of pain. Unless you are specifically asked for the level of your worst pain, or your average pain. If you are asked for one of these two, then it should be understood that it is over the past 24 hours.  Levels 1 (one) through 5 (five) are described below, and can be treated as an outpatient. Ambulatory pain management facilities such as ours are more than adequate to treat these levels. Levels 6 (six) through 10 (ten) are also described below, however, these must be treated as a hospitalized patient. While levels 6 (six) and 7 (seven) may be evaluated at an urgent care facility, levels 8 (eight) through 10 (ten) constitute medical emergencies and as such, they belong in a hospital's emergency department. When having these levels (as described below), do not come to our office. Our facility is not equipped to manage these levels. Go directly to an urgent care facility or an emergency department to be evaluated.  Definitions:  Activities of Daily Living (ADL): Activities of daily living (ADL or ADLs) is a term used in healthcare to refer to people's daily self-care activities. Health professionals often use a person's ability or inability to perform ADLs as a measurement of their functional status, particularly in regard to people post injury,  with disabilities and the elderly. There are two ADL levels: Basic and Instrumental. Basic Activities of Daily Living (BADL  or BADLs) consist of self-care tasks that include: Bathing and showering; personal hygiene and grooming (including brushing/combing/styling hair); dressing; Toilet hygiene (getting to the toilet, cleaning oneself, and getting back up); eating and self-feeding (not including cooking or chewing and swallowing); functional mobility, often referred to as "transferring", as measured by the ability to walk, get in and out of bed, and get into and out of a chair; the broader definition (moving from one place to another while performing activities) is useful for people with different physical abilities who are still able to get around independently. Basic ADLs include the things many people do when they get up in the morning and get ready to go out of the house: get out of bed, go to the toilet, bathe, dress, groom, and eat. On the average, loss of function typically follows a particular order. Hygiene is the first to go, followed by loss of toilet use and locomotion. The last to go is the ability to eat. When there is only one remaining area in which the person is independent, there is a 62.9% chance that it is eating and only a 3.5% chance that it is hygiene. Instrumental Activities of Daily Living (IADL or IADLs) are not necessary for fundamental functioning, but they let an individual live independently in a community. IADL consist of tasks that include: cleaning and maintaining the house; home establishment and maintenance; care of others (including selecting and supervising caregivers); care of pets; child rearing; managing money; managing financials (investments, etc.); meal preparation and cleanup; shopping for groceries and necessities; moving within the community; safety procedures and emergency responses; health management and  maintenance (taking prescribed medications); and using the  telephone or other form of communication.  Instructions:  Most patients tend to report their pain as a combination of two factors, their physical pain and their psychosocial pain. This last one is also known as "suffering" and it is reflection of how physical pain affects you socially and psychologically. From now on, report them separately.  From this point on, when asked to report your pain level, report only your physical pain. Use the following table for reference.  Pain Clinic Pain Levels (0-5/10)  Pain Level Score  Description  No Pain 0   Mild pain 1 Nagging, annoying, but does not interfere with basic activities of daily living (ADL). Patients are able to eat, bathe, get dressed, toileting (being able to get on and off the toilet and perform personal hygiene functions), transfer (move in and out of bed or a chair without assistance), and maintain continence (able to control bladder and bowel functions). Blood pressure and heart rate are unaffected. A normal heart rate for a healthy adult ranges from 60 to 100 bpm (beats per minute).   Mild to moderate pain 2 Noticeable and distracting. Impossible to hide from other people. More frequent flare-ups. Still possible to adapt and function close to normal. It can be very annoying and may have occasional stronger flare-ups. With discipline, patients may get used to it and adapt.   Moderate pain 3 Interferes significantly with activities of daily living (ADL). It becomes difficult to feed, bathe, get dressed, get on and off the toilet or to perform personal hygiene functions. Difficult to get in and out of bed or a chair without assistance. Very distracting. With effort, it can be ignored when deeply involved in activities.   Moderately severe pain 4 Impossible to ignore for more than a few minutes. With effort, patients may still be able to manage work or participate in some social activities. Very difficult to concentrate. Signs of autonomic  nervous system discharge are evident: dilated pupils (mydriasis); mild sweating (diaphoresis); sleep interference. Heart rate becomes elevated (>115 bpm). Diastolic blood pressure (lower number) rises above 100 mmHg. Patients find relief in laying down and not moving.   Severe pain 5 Intense and extremely unpleasant. Associated with frowning face and frequent crying. Pain overwhelms the senses.  Ability to do any activity or maintain social relationships becomes significantly limited. Conversation becomes difficult. Pacing back and forth is common, as getting into a comfortable position is nearly impossible. Pain wakes you up from deep sleep. Physical signs will be obvious: pupillary dilation; increased sweating; goosebumps; brisk reflexes; cold, clammy hands and feet; nausea, vomiting or dry heaves; loss of appetite; significant sleep disturbance with inability to fall asleep or to remain asleep. When persistent, significant weight loss is observed due to the complete loss of appetite and sleep deprivation.  Blood pressure and heart rate becomes significantly elevated. Caution: If elevated blood pressure triggers a pounding headache associated with blurred vision, then the patient should immediately seek attention at an urgent or emergency care unit, as these may be signs of an impending stroke.    Emergency Department Pain Levels (6-10/10)  Emergency Room Pain 6 Severely limiting. Requires emergency care and should not be seen or managed at an outpatient pain management facility. Communication becomes difficult and requires great effort. Assistance to reach the emergency department may be required. Facial flushing and profuse sweating along with potentially dangerous increases in heart rate and blood pressure will be evident.   Distressing  pain 7 Self-care is very difficult. Assistance is required to transport, or use restroom. Assistance to reach the emergency department will be required. Tasks requiring  coordination, such as bathing and getting dressed become very difficult.   Disabling pain 8 Self-care is no longer possible. At this level, pain is disabling. The individual is unable to do even the most "basic" activities such as walking, eating, bathing, dressing, transferring to a bed, or toileting. Fine motor skills are lost. It is difficult to think clearly.   Incapacitating pain 9 Pain becomes incapacitating. Thought processing is no longer possible. Difficult to remember your own name. Control of movement and coordination are lost.   The worst pain imaginable 10 At this level, most patients pass out from pain. When this level is reached, collapse of the autonomic nervous system occurs, leading to a sudden drop in blood pressure and heart rate. This in turn results in a temporary and dramatic drop in blood flow to the brain, leading to a loss of consciousness. Fainting is one of the body's self defense mechanisms. Passing out puts the brain in a calmed state and causes it to shut down for a while, in order to begin the healing process.    Summary: 1. Refer to this scale when providing [place] with your pain level. 2. Be accurate and careful when reporting your pain level. This will help with your care. 3. Over-reporting your pain level will lead to loss of credibility. 4. Even a level of 1/10 means that there is pain and will be treated at our facility. 5. High, inaccurate reporting will be documented as "Symptom Exaggeration", leading to loss of credibility and suspicions of possible secondary gains such as obtaining more narcotics, or wanting to appear disabled, for fraudulent reasons. 6. Only pain levels of 5 or below will be seen at our facility. 7. Pain levels of 6 and above will be sent to the Emergency Department and the appointment cancelled. ______________________________________________________________________________________________

## 2018-10-11 ENCOUNTER — Emergency Department
Admission: EM | Admit: 2018-10-11 | Discharge: 2018-10-11 | Disposition: A | Discharge status: Home / Self Care | Payer: Medicaid Other | Attending: Emergency Medicine | Admitting: Emergency Medicine

## 2018-10-11 ENCOUNTER — Emergency Department: Payer: Medicaid Other

## 2018-10-11 ENCOUNTER — Other Ambulatory Visit: Payer: Self-pay

## 2018-10-11 DIAGNOSIS — R51 Headache: Secondary | ICD-10-CM | POA: Insufficient documentation

## 2018-10-11 DIAGNOSIS — Z96651 Presence of right artificial knee joint: Secondary | ICD-10-CM | POA: Diagnosis not present

## 2018-10-11 DIAGNOSIS — Z79899 Other long term (current) drug therapy: Secondary | ICD-10-CM | POA: Insufficient documentation

## 2018-10-11 DIAGNOSIS — I110 Hypertensive heart disease with heart failure: Secondary | ICD-10-CM | POA: Diagnosis not present

## 2018-10-11 DIAGNOSIS — I050 Rheumatic mitral stenosis: Secondary | ICD-10-CM | POA: Diagnosis present

## 2018-10-11 DIAGNOSIS — R519 Headache, unspecified: Secondary | ICD-10-CM

## 2018-10-11 DIAGNOSIS — I2510 Atherosclerotic heart disease of native coronary artery without angina pectoris: Secondary | ICD-10-CM | POA: Diagnosis not present

## 2018-10-11 DIAGNOSIS — F17210 Nicotine dependence, cigarettes, uncomplicated: Secondary | ICD-10-CM | POA: Insufficient documentation

## 2018-10-11 DIAGNOSIS — I5032 Chronic diastolic (congestive) heart failure: Secondary | ICD-10-CM | POA: Diagnosis not present

## 2018-10-11 DIAGNOSIS — J209 Acute bronchitis, unspecified: Secondary | ICD-10-CM | POA: Diagnosis not present

## 2018-10-11 DIAGNOSIS — J449 Chronic obstructive pulmonary disease, unspecified: Secondary | ICD-10-CM | POA: Diagnosis not present

## 2018-10-11 DIAGNOSIS — J40 Bronchitis, not specified as acute or chronic: Secondary | ICD-10-CM

## 2018-10-11 DIAGNOSIS — R079 Chest pain, unspecified: Secondary | ICD-10-CM | POA: Diagnosis not present

## 2018-10-11 DIAGNOSIS — Z952 Presence of prosthetic heart valve: Secondary | ICD-10-CM

## 2018-10-11 DIAGNOSIS — R269 Unspecified abnormalities of gait and mobility: Secondary | ICD-10-CM | POA: Diagnosis present

## 2018-10-11 DIAGNOSIS — I051 Rheumatic mitral insufficiency: Secondary | ICD-10-CM | POA: Diagnosis present

## 2018-10-11 LAB — BASIC METABOLIC PANEL
Anion gap: 9 (ref 5–15)
BUN: 11 mg/dL (ref 6–20)
CO2: 29 mmol/L (ref 22–32)
Calcium: 9.2 mg/dL (ref 8.9–10.3)
Chloride: 102 mmol/L (ref 98–111)
Creatinine, Ser: 0.37 mg/dL — ABNORMAL LOW (ref 0.44–1.00)
GFR calc Af Amer: >60 mL/min (ref >60)
GLUCOSE: 100 mg/dL — AB (ref 70–99)
POTASSIUM: 3.6 mmol/L (ref 3.5–5.1)
Sodium: 140 mmol/L (ref 135–145)

## 2018-10-11 LAB — CBC
HCT: 41.4 % (ref 36.0–46.0)
HEMOGLOBIN: 12.5 g/dL (ref 12.0–15.0)
MCH: 23.6 pg — AB (ref 26.0–34.0)
MCHC: 30.2 g/dL (ref 30.0–36.0)
MCV: 78.3 fL — ABNORMAL LOW (ref 80.0–100.0)
Platelets: 292 10*3/uL (ref 150–400)
RBC: 5.29 MIL/uL — ABNORMAL HIGH (ref 3.87–5.11)
RDW: 18.7 % — ABNORMAL HIGH (ref 11.5–15.5)
WBC: 11.7 10*3/uL — ABNORMAL HIGH (ref 4.0–10.5)
nRBC: 0 % (ref 0.0–0.2)

## 2018-10-11 LAB — TROPONIN I

## 2018-10-11 LAB — BRAIN NATRIURETIC PEPTIDE: B Natriuretic Peptide: 127 pg/mL — ABNORMAL HIGH (ref 0.0–100.0)

## 2018-10-11 MED ORDER — BUTALBITAL-APAP-CAFFEINE 50-325-40 MG PO TABS: 1.0000 | ORAL_TABLET | Freq: Four times a day (QID) | ORAL | 0 refills | Status: DC | PRN

## 2018-10-11 MED ORDER — FENTANYL CITRATE (PF) 100 MCG/2ML IJ SOLN
50.0000 ug | Freq: Once | INTRAMUSCULAR | Status: AC
  Administered 2018-10-11: 50 ug via INTRAVENOUS
  Filled 2018-10-11: qty 2

## 2018-10-11 MED ORDER — LORAZEPAM 2 MG/ML IJ SOLN
1.0000 mg | Freq: Once | INTRAMUSCULAR | Status: AC
  Administered 2018-10-11: 1 mg via INTRAVENOUS
  Filled 2018-10-11: qty 1

## 2018-10-11 MED ORDER — IPRATROPIUM-ALBUTEROL 0.5-2.5 (3) MG/3ML IN SOLN
3.0000 mL | Freq: Once | RESPIRATORY_TRACT | Status: AC
  Administered 2018-10-11: 3 mL via RESPIRATORY_TRACT
  Filled 2018-10-11: qty 3

## 2018-10-11 MED ORDER — METOCLOPRAMIDE HCL 5 MG/ML IJ SOLN
10.0000 mg | Freq: Once | INTRAMUSCULAR | Status: AC
  Administered 2018-10-11: 10 mg via INTRAVENOUS
  Filled 2018-10-11: qty 2

## 2018-10-11 MED ORDER — MORPHINE SULFATE (PF) 4 MG/ML IV SOLN
4.0000 mg | Freq: Once | INTRAVENOUS | Status: AC
  Administered 2018-10-11: 4 mg via INTRAVENOUS
  Filled 2018-10-11: qty 1

## 2018-10-11 MED ORDER — ONDANSETRON HCL 4 MG/2ML IJ SOLN: 4.0000 mg | Freq: Once | INTRAMUSCULAR | Status: DC

## 2018-10-11 NOTE — ED Provider Notes (Signed)
[facility] Emergency Department Provider Note  ____________________________________________  Time seen: Approximately 12:00 PM  I have reviewed the triage vital signs and the nursing notes.   HISTORY  Chief Complaint Chest Pain   HPI Chelsea Ramsey is a 52 y.o. 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  Patient reports that she missed cardiac rehab last week due to a gout flare.  She was coming back today.  She woke up this morning not feeling so well.  She has had a headache that started mild and became progressively worse throughout the day.  She has been having these headaches since before her surgery in July.  She was put on Topamax for it.  She reports that the headaches sharp and located on the top of her head.  As the headache got progressively worse while patient was on the bus coming to the hospital she started noticing intermittent black spot into her right eye visual field, also developed intermittent dizziness/ off balance, and nausea. She reports that the associated symptoms were new when compared to prior headaches.  She was walking on a treadmill when her headache started to intensify and became severe.  Her dizziness also became progressively worse.  Patient then started having tightness in her chest and wheezing, also had a dry cough.  She reports that the tightness in your chest radiated down to her left arm which made her concerned.  She stopped walking the treadmill and her symptoms did not improve so she was sent to the emergency room for evaluation.  Patient does endorse having similar episodes of chest pain since the surgery which have been attributed to her scar.  She denies any personal or family history of blood clots.  She has been  compliant with her Xarelto.  The pain is not pleuritic in nature.   Past Medical History:  Diagnosis Date  . Allergy    seasonal  . Anxiety   . Arthritis    Right Knee  . Asthma   . CHF (congestive heart failure) ([place])   . COPD (chronic obstructive pulmonary disease) ([place])   . Coronary artery disease    Leaky heart valve  . Fibromyalgia   . GERD (gastroesophageal reflux disease)   . Hypertension   . Pneumonia   . PUD (peptic ulcer disease)   . Pulmonary HTN ([place])   . Rheumatic fever/heart disease   . Sleep apnea     Patient Active Problem List   Diagnosis Date Noted  . Chronic bilateral low back pain with left-sided sciatica (Primary Area of Pain) 10/10/2018  . Chronic pain of left lower extremity (Secondary Area of Pain) 10/10/2018  . Sternal pain (Tertiary Area of Pain) 10/10/2018  . Fibromyalgia (Fourth Area of Pain) 10/10/2018  . Chronic pain of right knee 10/10/2018  . Chronic sacroiliac joint pain 10/10/2018  . Chronic pain syndrome 10/10/2018  . Long term current use of opiate analgesic 10/10/2018  . Pharmacologic therapy 10/10/2018  . Disorder of skeletal system 10/10/2018  . Problems influencing health status 10/10/2018  . Chronic diastolic heart failure ([place]) 07/26/2018  . HTN (hypertension) 07/26/2018  . Acute CHF (congestive heart failure) ([place]) 07/17/2018  . Melena   . Nausea and vomiting   . Acute esophagogastric ulcer   . Intractable vomiting with nausea   .  GIB (gastrointestinal bleeding) 05/22/2018  . A-fib ([place]) 04/22/2018  . COPD (chronic obstructive pulmonary disease) ([place]) 12/21/2017  . COPD (chronic obstructive pulmonary disease) with acute bronchitis ([place]) 12/20/2017  . Primary localized osteoarthritis of right knee 09/04/2016    Past Surgical History:  Procedure Laterality Date  . ADENOIDECTOMY    . ESOPHAGOGASTRODUODENOSCOPY (EGD) WITH PROPOFOL N/A 05/25/2018   Procedure: ESOPHAGOGASTRODUODENOSCOPY (EGD) WITH PROPOFOL;  Surgeon: Lucilla Lame, MD;  Location: [facility] ENDOSCOPY;  Service: Endoscopy;  Laterality: N/A;  . MITRAL VALVE REPLACEMENT    . MULTIPLE TOOTH EXTRACTIONS    . TONSILLECTOMY    . TOTAL KNEE ARTHROPLASTY Right 09/04/2016   Procedure: TOTAL KNEE ARTHROPLASTY; with lateral release;  Surgeon: Earlie Server, MD;  Location: [place];  Service: Orthopedics;  Laterality: Right;    Prior to Admission medications   Medication Sig Start Date End Date Taking? Authorizing Provider  bisacodyl (DULCOLAX) 5 MG EC tablet Take 2 tablets (10 mg total) by mouth daily as needed for moderate constipation. 07/21/18  Yes Epifanio Lesches, MD  budesonide-formoterol (SYMBICORT) 160-4.5 MCG/ACT inhaler Inhale 2 puffs into the lungs 2 (two) times daily. 02/26/16  Yes [provider]  bumetanide (BUMEX) 2 MG tablet Take 2 tablets (4 mg total) by mouth daily. Patient taking differently: Take 2 mg by mouth daily.  07/22/18  Yes Epifanio Lesches, MD  ferrous sulfate 325 (65 FE) MG tablet Take 325 mg by mouth daily. 06/01/18  Yes [provider]  gabapentin (NEURONTIN) 300 MG capsule Take 600 mg by mouth 3 (three) times daily.  07/22/15  Yes [provider]  HYDROcodone-acetaminophen (NORCO) 10-325 MG tablet Take 1 tablet by mouth daily as needed. 08/17/18  Yes [provider]  ipratropium-albuterol (DUONEB) 0.5-2.5 (3) MG/3ML SOLN Inhale 3 mLs into the lungs every 8 (eight) hours as needed (respiratory).  02/26/16 12/20/18 Yes [provider]  lactulose (CHRONULAC) 10 GM/15ML solution Take 45 mLs (30 g total) by mouth 2 (two) times daily as needed for mild constipation. 07/21/18  Yes Epifanio Lesches, MD  magnesium oxide (MAG-OX) 400 MG tablet Take 400 mg by mouth 2 (two) times daily. 07/11/18 07/11/19 Yes [provider]  metoprolol tartrate (LOPRESSOR) 50 MG tablet Take 150 mg by mouth daily.   Yes [provider]  omeprazole (PRILOSEC) 40 MG capsule Take 40 mg by mouth 2 (two)  times daily.   Yes [provider]  rivaroxaban (XARELTO) 20 MG TABS tablet Take 20 mg by mouth daily. 07/11/18 07/11/19 Yes [provider]  tiZANidine (ZANAFLEX) 4 MG tablet Take 4 mg by mouth at bedtime.  08/10/18  Yes [provider]  topiramate (TOPAMAX) 25 MG tablet Take 25 mg by mouth 2 (two) times daily.  08/10/18  Yes [provider]  albuterol (PROVENTIL HFA;VENTOLIN HFA) 108 (90 Base) MCG/ACT inhaler Inhale into the lungs. 08/10/18   [provider]  butalbital-acetaminophen-caffeine (FIORICET, ESGIC) 50-325-40 MG tablet Take 1-2 tablets by mouth every 6 (six) hours as needed for headache. 10/11/18 10/11/19  Rudene Re, MD  diltiazem (CARDIZEM CD) 240 MG 24 hr capsule Take 1 capsule (240 mg total) by mouth daily. Patient not taking: Reported on 10/11/2018 04/26/18   Vaughan Basta, MD  ipratropium (ATROVENT) 0.02 % nebulizer solution Inhale into the lungs. 08/10/18   [provider]    Allergies Amiodarone; Aspirin; Flexeril [cyclobenzaprine]; Trazamine [trazodone & diet manage prod]; Codeine; and Tramadol  Family History  Problem Relation Age of Onset  . COPD Mother   .  Cancer Mother        Bone  . Asthma Mother   . Congestive Heart Failure Father     Social History Social History   Tobacco Use  . Smoking status: Current Every Day Smoker    Packs/day: 0.50    Types: Cigarettes  . Smokeless tobacco: Never Used  Substance Use Topics  . Alcohol use: Not Currently    Alcohol/week: 3.0 standard drinks    Types: 3 Cans of beer per week    Comment: 16 oz per week  . Drug use: Not Currently    Comment: Previous use of cocaine and marijuana last use 07/31/16     Review of Systems  Constitutional: Negative for fever. Eyes: Negative for visual changes. ENT: Negative for sore throat. Neck: No neck pain  Cardiovascular: + chest pain. Respiratory: Negative for shortness of breath. + cough and  wheezing Gastrointestinal: Negative for abdominal pain, vomiting or diarrhea. Genitourinary: Negative for dysuria. Musculoskeletal: Negative for back pain. Skin: Negative for rash. Neurological: Negative for weakness or numbness. + HA, dizziness Psych: No SI or HI  ____________________________________________   PHYSICAL EXAM:  VITAL SIGNS: ED Triage Vitals  Enc Vitals Group     BP 10/11/18 1042 (!) 130/103     Pulse Rate 10/11/18 1042 100     Resp 10/11/18 1042 18     Temp 10/11/18 1042 97.6 F (36.4 C)     Temp Source 10/11/18 1042 Oral     SpO2 10/11/18 1042 98 %     Weight 10/11/18 1039 255 lb 11.7 oz (116 kg)     Height 10/11/18 1039 5\' 4"  (1.626 m)     Head Circumference --      Peak Flow --      Pain Score 10/11/18 1109 10     Pain Loc --      Pain Edu? --      Excl. in [place]? --     Constitutional: Alert and oriented. Well appearing and in no apparent distress. HEENT:      Head: Normocephalic and atraumatic.         Eyes: Conjunctivae are normal. Sclera is non-icteric.       Mouth/Throat: Mucous membranes are moist.       Neck: Supple with no signs of meningismus. Cardiovascular: Regular rate and rhythm. No murmurs, gallops, or rubs. 2+ symmetrical distal pulses are present in all extremities. No JVD. Respiratory: Normal respiratory effort.  Normal sats, normal work of breathing, patient has lungs are clear to auscultation bilaterally with audible upper airway wheezing but clear lungs with good air movement, no wheezing or crackles.   Gastrointestinal: Soft, non tender, and non distended with positive bowel sounds. No rebound or guarding. Musculoskeletal: Nontender with normal range of motion in all extremities. No edema, cyanosis, or erythema of extremities. Neurologic: Normal speech and language.  Face symmetric, intact extraocular movements, intact visual fields, pupils are equal round and reactive, strength and sensation normal x4, no dysmetria or pronator  drift. Skin: Skin is warm, dry and intact. No rash noted. Psychiatric: Mood and affect are normal. Speech and behavior are normal.  ____________________________________________   LABS (all labs ordered are listed, but only abnormal results are displayed)  Labs Reviewed  BASIC METABOLIC PANEL - Abnormal; Notable for the following components:      Result Value   Glucose, Bld 100 (*)    Creatinine, Ser 0.37 (*)    All other components within normal limits  CBC -  Abnormal; Notable for the following components:   WBC 11.7 (*)    RBC 5.29 (*)    MCV 78.3 (*)    MCH 23.6 (*)    RDW 18.7 (*)    All other components within normal limits  BRAIN NATRIURETIC PEPTIDE - Abnormal; Notable for the following components:   B Natriuretic Peptide 127.0 (*)    All other components within normal limits  TROPONIN I  TROPONIN I  POC URINE PREG, ED   ____________________________________________  EKG  ED ECG REPORT I, Rudene Re, the attending physician, personally viewed and interpreted this ECG.  Sinus tachycardia, rate of 104, normal intervals, normal axis, no ST elevations or depressions, diffuse T wave flattening.  Unchanged from prior. ____________________________________________  RADIOLOGY  I have personally reviewed the images performed during this visit and I agree with the Radiologist's read.   Interpretation by Radiologist:  Dg Chest 1 View  Result Date: 10/11/2018 CLINICAL DATA:  Chest pain and headache EXAM: CHEST  1 VIEW COMPARISON:  Chest radiograph and chest CT July 17, 2018 FINDINGS: There is fine interstitial prominence diffusely, likely chronic interstitial edema. No airspace consolidation. There is cardiomegaly with pulmonary vascularity normal. Patient is status post mitral valve replacement. Left atrial appendage clamp present. No adenopathy. No bone lesions. IMPRESSION: Cardiomegaly with apparent chronic interstitial edema. No consolidation. Postoperative changes  with evidence of previous mitral valve replacement and left atrial appendage clamp. No evident adenopathy. Electronically Signed   By: Lowella Grip III M.D.   On: 10/11/2018 11:12   Ct Head Wo Contrast  Result Date: 10/11/2018 CLINICAL DATA:  Acute headache, this occurred during cardiac rehab this morning EXAM: CT HEAD WITHOUT CONTRAST TECHNIQUE: Contiguous axial images were obtained from the base of the skull through the vertex without intravenous contrast. COMPARISON:  CT brain scan of 04/24/2018 FINDINGS: Brain: The ventricular system is stable in size and configuration the septum remains midline in position. The fourth ventricle and basilar cisterns are unremarkable. No hemorrhage, mass lesion, acute infarction is seen. Vascular: No vascular abnormality is noted on this unenhanced study. Skull: On bone window images, no calvarial abnormality seen. Sinuses/Orbits: Paranasal sinuses that are visualized are well pneumatized. Other: None. IMPRESSION: Negative unenhanced CT brain. Electronically Signed   By: Ivar Drape M.D.   On: 10/11/2018 12:04   Mr Brain Wo Contrast  Result Date: 10/11/2018 CLINICAL DATA:  Vertigo, persistent central.  Headache and dizziness EXAM: MRI HEAD WITHOUT CONTRAST TECHNIQUE: Multiplanar, multiecho pulse sequences of the brain and surrounding structures were obtained without intravenous contrast. COMPARISON:  CT head 10/11/2018 FINDINGS: Brain: No acute infarction, hemorrhage, hydrocephalus, extra-axial collection or mass lesion. Image quality degraded by mild motion. Vascular: Normal arterial flow voids Skull and upper cervical spine: Negative Sinuses/Orbits: Negative Other: None IMPRESSION: Negative MRI head.  Image quality degraded by motion. Electronically Signed   By: Franchot Gallo M.D.   On: 10/11/2018 14:53     ____________________________________________   PROCEDURES  Procedure(s) performed: None Procedures Critical Care performed:   None ____________________________________________   INITIAL IMPRESSION / ASSESSMENT AND PLAN / ED COURSE  52 y.o. 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   # HA, dizziness, nausea: patient seems to be having these episodes since the surgery. On note from cardiologist from 10/16, it is mentioned that patient has  been having these episodes frequently. She is neurologically intact. No thunderclap HA. Head CT negative for head bleed. Will pursue MRI to rule out stroke. Ddx TIAs vs complex migraine HAs. She is currently on topomax for those. If MRI negative, will need referral to neuro  #CP, wheezing: Patient with normal work of breathing, audible upper airway wheezing but clear lungs with good air movement, no crackles or wheezing.  Differential diagnoses including ACS versus COPD. Less likely PE with intermittent symptoms and currently on Xarelto. EKG with no evidence of ischemia. 1st troponin is negative. Will get 2nd troponin. CXR with no evidence of PNA, PTX. Will give morphine for pain. Patient allergic to asa.    Clinical Course as of Oct 11 1622  Tue Oct 11, 2018  1616 Patient symptoms have fully resolved.  CT and MRI of the head with no acute findings.  Troponin x2 is negative.  At this time will prescribe patient Fioricet for possible complicated migraines and refer her to neurology.  Discussed return precautions for thunderclap headache or neurological deficits.   [CV]    Clinical Course User Index [CV] Alfred Levins [place], MD     As part of my medical decision making, I reviewed the following data within the [place] notes reviewed and incorporated, Labs reviewed , EKG interpreted , Old EKG reviewed, Old chart reviewed, Radiograph reviewed , Notes from  prior ED visits and [place] Controlled Substance Database    Pertinent labs & imaging results that were available during my care of the patient were reviewed by me and considered in my medical decision making (see chart for details).    ____________________________________________   FINAL CLINICAL IMPRESSION(S) / ED DIAGNOSES  Final diagnoses:  Acute nonintractable headache, unspecified headache type  Bronchitis      NEW MEDICATIONS STARTED DURING THIS VISIT:  ED Discharge Orders         Ordered    butalbital-acetaminophen-caffeine (FIORICET, ESGIC) 50-325-40 MG tablet  Every 6 hours PRN     10/11/18 1620           Note:  This document was prepared using Dragon voice recognition software and may include unintentional dictation errors.    Rudene Re, MD 10/11/18 [phone]

## 2018-10-11 NOTE — ED Triage Notes (Signed)
Pt came to ED from cardiac rehab. Pt c/o chest pain radiating to left shoulder and arm, described as pressure. Pt also vomiting and c/o headache. Reports feels like someone is hitting her with a hammer.

## 2018-10-11 NOTE — ED Notes (Signed)
Pt states she was at cardiac rehab this am and felt like she over did it. Pt c/o pounding headache and left chest sharp chest pain.

## 2018-10-11 NOTE — Discharge Instructions (Addendum)

## 2018-10-11 NOTE — ED Notes (Signed)
Patient transported to MRI 

## 2018-10-11 NOTE — Progress Notes (Signed)
Daily Session Note  Patient Details  Name: Chelsea Ramsey MRN: [id number] Date of Birth: 02/09/1966 Referring Provider:     Cardiac Rehab from 08/25/2018 in [facility]  Referring Provider  Smith      Encounter Date: 10/11/2018  Check In: Session Check In - 10/11/18 0915      Check-In   Supervising physician immediately available to respond to emergencies  See telemetry face sheet for immediately available ER MD    Location  [facility]    Staff Present  Jessica Hawkins, MA, RCEP, CCRP, Exercise Physiologist;Joseph Hood RCP,RRT,BSRT;Susanne Bice, RN, BSN, CCRP    Medication changes reported      No    Fall or balance concerns reported     No    Tobacco Cessation  No Change    Warm-up and Cool-down  Performed on first and last piece of equipment    Resistance Training Performed  Yes    VAD Patient?  No    PAD/SET Patient?  No      Pain Assessment   Currently in Pain?  No/denies          Social History   Tobacco Use  Smoking Status Current Every Day Smoker  . Packs/day: 0.50  . Types: Cigarettes  Smokeless Tobacco Never Used    Goals Met:  Independence with exercise equipment  Goals Unmet:  Not Applicable  Comments: Pt experienced dizziness while walking on the treadmill. Assisted patient to seating. Patient then reported nausea and chest pain. She was taken to the Emergency Department for further evaluation. BP 148/80, O2 100%, HR 90.   Dr. Mark Miller is Medical Director for [facility] and [facility]. 

## 2018-10-14 LAB — COMPLIANCE DRUG ANALYSIS, UR

## 2018-10-15 LAB — COMP. METABOLIC PANEL (12)
AST: 13 IU/L (ref 0–40)
Albumin/Globulin Ratio: 1.5 (ref 1.2–2.2)
Albumin: 4.4 g/dL (ref 3.5–5.5)
Alkaline Phosphatase: 132 IU/L — ABNORMAL HIGH (ref 39–117)
BUN/Creatinine Ratio: 18 (ref 9–23)
BUN: 11 mg/dL (ref 6–24)
Bilirubin Total: <0.2 mg/dL (ref 0.0–1.2)
CALCIUM: 9.5 mg/dL (ref 8.7–10.2)
CHLORIDE: 101 mmol/L (ref 96–106)
CREATININE: 0.62 mg/dL (ref 0.57–1.00)
GFR, EST AFRICAN AMERICAN: 121 mL/min/{1.73_m2} (ref >59)
GFR, EST NON AFRICAN AMERICAN: 105 mL/min/{1.73_m2} (ref >59)
GLUCOSE: 118 mg/dL — AB (ref 65–99)
Globulin, Total: 2.9 g/dL (ref 1.5–4.5)
Potassium: 3.5 mmol/L (ref 3.5–5.2)
Sodium: 144 mmol/L (ref 134–144)
Total Protein: 7.3 g/dL (ref 6.0–8.5)

## 2018-10-15 LAB — C-REACTIVE PROTEIN: CRP: 11 mg/L — ABNORMAL HIGH (ref 0–10)

## 2018-10-15 LAB — VITAMIN B12: Vitamin B-12: 615 pg/mL (ref 232–1245)

## 2018-10-15 LAB — 25-HYDROXYVITAMIN D LCMS D2+D3
25-HYDROXY, VITAMIN D-3: 9.4 ng/mL
25-HYDROXY, VITAMIN D: 9.6 ng/mL — AB

## 2018-10-15 LAB — MAGNESIUM: Magnesium: 1.7 mg/dL (ref 1.6–2.3)

## 2018-10-15 LAB — SEDIMENTATION RATE: SED RATE: 79 mm/h — AB (ref 0–40)

## 2018-10-17 ENCOUNTER — Ambulatory Visit
Admission: RE | Admit: 2018-10-17 | Discharge: 2018-10-17 | Disposition: A | Discharge status: Home / Self Care | Payer: Medicaid Other | Source: Ambulatory Visit | Attending: Nurse Practitioner | Admitting: Nurse Practitioner

## 2018-10-17 DIAGNOSIS — M533 Sacrococcygeal disorders, not elsewhere classified: Secondary | ICD-10-CM | POA: Insufficient documentation

## 2018-10-17 DIAGNOSIS — R079 Chest pain, unspecified: Secondary | ICD-10-CM | POA: Diagnosis present

## 2018-10-17 DIAGNOSIS — M25561 Pain in right knee: Principal | ICD-10-CM

## 2018-10-17 DIAGNOSIS — G8929 Other chronic pain: Secondary | ICD-10-CM | POA: Insufficient documentation

## 2018-10-17 DIAGNOSIS — Z96651 Presence of right artificial knee joint: Secondary | ICD-10-CM | POA: Diagnosis not present

## 2018-10-18 NOTE — Progress Notes (Signed)
Results were reviewed and found to be: mildly abnormal  No acute injury or pathology identified  Review would suggest interventional pain management techniques may be of benefit 

## 2018-10-24 ENCOUNTER — Encounter: Payer: Self-pay | Admitting: *Deleted

## 2018-10-24 ENCOUNTER — Telehealth: Payer: Self-pay | Admitting: *Deleted

## 2018-10-24 DIAGNOSIS — Z952 Presence of prosthetic heart valve: Secondary | ICD-10-CM

## 2018-10-24 NOTE — Telephone Encounter (Signed)
Called to check on pt.  She was sent to [facility] ED after office visit with high PHQ and she left without being seen.  She has not returned to rehab.  Left message.

## 2018-10-31 ENCOUNTER — Ambulatory Visit: Payer: Medicaid Other | Admitting: Pain Medicine

## 2018-11-01 ENCOUNTER — Encounter: Payer: Medicaid Other | Attending: Cardiovascular Disease

## 2018-11-01 DIAGNOSIS — R269 Unspecified abnormalities of gait and mobility: Secondary | ICD-10-CM | POA: Insufficient documentation

## 2018-11-01 DIAGNOSIS — I050 Rheumatic mitral stenosis: Secondary | ICD-10-CM | POA: Insufficient documentation

## 2018-11-01 DIAGNOSIS — I051 Rheumatic mitral insufficiency: Secondary | ICD-10-CM | POA: Insufficient documentation

## 2018-11-02 ENCOUNTER — Encounter: Payer: Self-pay | Admitting: *Deleted

## 2018-11-02 DIAGNOSIS — Z952 Presence of prosthetic heart valve: Secondary | ICD-10-CM

## 2018-11-02 NOTE — Progress Notes (Signed)
Cardiac Individual Treatment Plan  Patient Details  Name: Chelsea Ramsey MRN: [id number] Date of Birth: 15-Aug-1966 Referring Provider:     Cardiac Rehab from 08/25/2018 in [facility] Cardiac and Pulmonary Rehab  Referring Provider  [name]      Initial Encounter Date:    Cardiac Rehab from 08/25/2018 in [facility] Cardiac and Pulmonary Rehab  Date  08/25/18      Visit Diagnosis: S/P mitral valve replacement  Patient's Home Medications on Admission:  Current Outpatient Medications:  .  albuterol (PROVENTIL HFA;VENTOLIN HFA) 108 (90 Base) MCG/ACT inhaler, Inhale into the lungs., Disp: , Rfl:  .  bisacodyl (DULCOLAX) 5 MG EC tablet, Take 2 tablets (10 mg total) by mouth daily as needed for moderate constipation., Disp: 30 tablet, Rfl: 0 .  budesonide-formoterol (SYMBICORT) 160-4.5 MCG/ACT inhaler, Inhale 2 puffs into the lungs 2 (two) times daily., Disp: , Rfl:  .  bumetanide (BUMEX) 2 MG tablet, Take 2 tablets (4 mg total) by mouth daily. (Patient taking differently: Take 2 mg by mouth daily. ), Disp: 30 tablet, Rfl: 0 .  butalbital-acetaminophen-caffeine (FIORICET, ESGIC) 50-325-40 MG tablet, Take 1-2 tablets by mouth every 6 (six) hours as needed for headache., Disp: 20 tablet, Rfl: 0 .  diltiazem (CARDIZEM CD) 240 MG 24 hr capsule, Take 1 capsule (240 mg total) by mouth daily. (Patient not taking: Reported on 10/11/2018), Disp: 30 capsule, Rfl: 0 .  ferrous sulfate 325 (65 FE) MG tablet, Take 325 mg by mouth daily., Disp: , Rfl:  .  gabapentin (NEURONTIN) 300 MG capsule, Take 600 mg by mouth 3 (three) times daily. , Disp: , Rfl:  .  HYDROcodone-acetaminophen (NORCO) 10-325 MG tablet, Take 1 tablet by mouth daily as needed., Disp: , Rfl: 0 .  ipratropium (ATROVENT) 0.02 % nebulizer solution, Inhale into the lungs., Disp: , Rfl:  .  ipratropium-albuterol (DUONEB) 0.5-2.5 (3) MG/3ML SOLN, Inhale 3 mLs into the lungs every 8 (eight) hours as needed (respiratory). , Disp: , Rfl:  .  lactulose  (CHRONULAC) 10 GM/15ML solution, Take 45 mLs (30 g total) by mouth 2 (two) times daily as needed for mild constipation., Disp: 240 mL, Rfl: 0 .  magnesium oxide (MAG-OX) 400 MG tablet, Take 400 mg by mouth 2 (two) times daily., Disp: , Rfl:  .  metoprolol tartrate (LOPRESSOR) 50 MG tablet, Take 150 mg by mouth daily., Disp: , Rfl:  .  omeprazole (PRILOSEC) 40 MG capsule, Take 40 mg by mouth 2 (two) times daily., Disp: , Rfl:  .  rivaroxaban (XARELTO) 20 MG TABS tablet, Take 20 mg by mouth daily., Disp: , Rfl:  .  tiZANidine (ZANAFLEX) 4 MG tablet, Take 4 mg by mouth at bedtime. , Disp: , Rfl:  .  topiramate (TOPAMAX) 25 MG tablet, Take 25 mg by mouth 2 (two) times daily. , Disp: , Rfl:   Past Medical History: Past Medical History:  Diagnosis Date  . Allergy    seasonal  . Anxiety   . Arthritis    Right Knee  . Asthma   . CHF (congestive heart failure) ([place])   . COPD (chronic obstructive pulmonary disease) ([place])   . Coronary artery disease    Leaky heart valve  . Fibromyalgia   . GERD (gastroesophageal reflux disease)   . Hypertension   . Pneumonia   . PUD (peptic ulcer disease)   . Pulmonary HTN ([place])   . Rheumatic fever/heart disease   . Sleep apnea     Tobacco Use: Social History  Tobacco Use  Smoking Status Current Every Day Smoker  . Packs/day: 0.50  . Types: Cigarettes  Smokeless Tobacco Never Used    Labs: Recent Review Flowsheet Data    Labs for ITP Cardiac and Pulmonary Rehab Latest Ref Rng & Units 06/03/2012 12/09/2013 04/03/2014 08/21/2014 12/20/2017   Cholestrol 0 - 200 mg/dL - 91 96 - -   LDLCALC mg/dL - 50 51 - -   HDL 35 - 70 mg/dL - 31(L) 32(A) - -   Trlycerides 40 - 160 mg/dL - 50 63 - -   Hemoglobin A1c - 4.5 - - 5.2 -   PHART 7.350 - 7.450 - - - - 7.39   PCO2ART 32.0 - 48.0 mmHg - - - - 41   HCO3 20.0 - 28.0 mmol/L - - - - 24.8   ACIDBASEDEF 0.0 - 2.0 mmol/L - - - - 0.2   O2SAT % - - - - 98.4       Exercise Target Goals: Exercise Program  Goal: Individual exercise prescription set using results from initial 6 min walk test and THRR while considering  patient's activity barriers and safety.   Exercise Prescription Goal: Initial exercise prescription builds to 30-45 minutes a day of aerobic activity, 2-3 days per week.  Home exercise guidelines will be given to patient during program as part of exercise prescription that the participant will acknowledge.  Activity Barriers & Risk Stratification: Activity Barriers & Cardiac Risk Stratification - 08/25/18 1550      Activity Barriers & Cardiac Risk Stratification   Activity Barriers  Arthritis;Back Problems;Shortness of Breath;Joint Problems;Fibromyalgia    Cardiac Risk Stratification  Moderate       6 Minute Walk: 6 Minute Walk    Row Name 08/25/18 1500         6 Minute Walk   Phase  Initial     Distance  965 feet     Walk Time  6 minutes     # of Rest Breaks  0     MPH  1.83     METS  2.7     RPE  11     Perceived Dyspnea   2     VO2 Peak  9.46     Symptoms  Yes (comment)     Comments  back pain 6/10 / pulled 02 tank on 2 L     Resting HR  102 bpm     Resting BP  126/90     Resting Oxygen Saturation   98 %     Exercise Oxygen Saturation  during 6 min walk  93 %     Max Ex. HR  110 bpm     Max Ex. BP  136/84     2 Minute Post BP  116/84       Interval Oxygen   Interval Oxygen?  Yes     1 Minute Oxygen Saturation %  93 %     1 Minute Liters of Oxygen  2 L     2 Minute Oxygen Saturation %  95 %     2 Minute Liters of Oxygen  2 L     3 Minute Liters of Oxygen  2 L     4 Minute Oxygen Saturation %  95 %     4 Minute Liters of Oxygen  2 L     5 Minute Liters of Oxygen  2 L     6 Minute Oxygen Saturation %  97 %  6 Minute Liters of Oxygen  2 L     2 Minute Post Oxygen Saturation %  97 %     2 Minute Post Liters of Oxygen  2 L        Oxygen Initial Assessment:   Oxygen Re-Evaluation: Oxygen Re-Evaluation    Row Name 09/27/18 1012              Program Oxygen Prescription   Program Oxygen Prescription  None         Home Oxygen   Home Oxygen Device  Portable Concentrator;Home Concentrator       Sleep Oxygen Prescription  Continuous       Liters per minute  3       Home Exercise Oxygen Prescription  None       Home at Rest Exercise Oxygen Prescription  None uses as needed on 2L       Compliance with Home Oxygen Use  Yes         Goals/Expected Outcomes   Short Term Goals  To learn and exhibit compliance with exercise, home and travel O2 prescription;To learn and understand importance of monitoring SPO2 with pulse oximeter and demonstrate accurate use of the pulse oximeter.;To learn and understand importance of maintaining oxygen saturations>88%;To learn and demonstrate proper pursed lip breathing techniques or other breathing techniques.;To learn and demonstrate proper use of respiratory medications       Long  Term Goals  Exhibits compliance with exercise, home and travel O2 prescription;Verbalizes importance of monitoring SPO2 with pulse oximeter and return demonstration;Maintenance of O2 saturations>88%;Exhibits proper breathing techniques, such as pursed lip breathing or other method taught during program session;Compliance with respiratory medication       Comments  Chelsea Ramsey is doing well in rehab.  She is doing well with her nebulaizers and inhalers.  She monitors her sats daily with pulse oximeter.  We talked about using PLB regularly. She is compliant with her oxygen at night and only uses it during the day if she feels SOB.       Goals/Expected Outcomes  Short: Continue to monitor saturations and wean off day time use of oxygen.  Long: Continue monitor pulmonary disease.           Oxygen Discharge (Final Oxygen Re-Evaluation): Oxygen Re-Evaluation - 09/27/18 1012      Program Oxygen Prescription   Program Oxygen Prescription  None      Home Oxygen   Home Oxygen Device  Portable Concentrator;Home Concentrator    Sleep Oxygen  Prescription  Continuous    Liters per minute  3    Home Exercise Oxygen Prescription  None    Home at Rest Exercise Oxygen Prescription  None   uses as needed on 2L   Compliance with Home Oxygen Use  Yes      Goals/Expected Outcomes   Short Term Goals  To learn and exhibit compliance with exercise, home and travel O2 prescription;To learn and understand importance of monitoring SPO2 with pulse oximeter and demonstrate accurate use of the pulse oximeter.;To learn and understand importance of maintaining oxygen saturations>88%;To learn and demonstrate proper pursed lip breathing techniques or other breathing techniques.;To learn and demonstrate proper use of respiratory medications    Long  Term Goals  Exhibits compliance with exercise, home and travel O2 prescription;Verbalizes importance of monitoring SPO2 with pulse oximeter and return demonstration;Maintenance of O2 saturations>88%;Exhibits proper breathing techniques, such as pursed lip breathing or other method taught during program session;Compliance with respiratory medication  Comments  Chelsea Ramsey is doing well in rehab.  She is doing well with her nebulaizers and inhalers.  She monitors her sats daily with pulse oximeter.  We talked about using PLB regularly. She is compliant with her oxygen at night and only uses it during the day if she feels SOB.    Goals/Expected Outcomes  Short: Continue to monitor saturations and wean off day time use of oxygen.  Long: Continue monitor pulmonary disease.        Initial Exercise Prescription: Initial Exercise Prescription - 08/25/18 1500      Date of Initial Exercise RX and Referring Provider   Date  08/25/18    Referring Provider  [name]      Oxygen   Oxygen  Continuous    Liters  2      Treadmill   MPH  1.8    Grade  1    Minutes  15    METs  2.63      NuStep   Level  2    SPM  80    Minutes  15    METs  2.6      Biostep-RELP   Level  2    SPM  50    Minutes  15    METs  2       Prescription Details   Frequency (times per week)  2    Duration  Progress to 30 minutes of continuous aerobic without signs/symptoms of physical distress      Intensity   THRR 40-80% of Max Heartrate  128-155    Ratings of Perceived Exertion  11-13    Perceived Dyspnea  0-4      Resistance Training   Training Prescription  Yes    Weight  3 lb    Reps  10-15       Perform Capillary Blood Glucose checks as needed.  Exercise Prescription Changes: Exercise Prescription Changes    Row Name 08/25/18 1500 09/12/18 1600 09/27/18 1000 10/11/18 1500       Response to Exercise   Blood Pressure (Admit)  126/90  132/82  122/62  126/70    Blood Pressure (Exercise)  136/84  138/60  146/74  148/80    Blood Pressure (Exit)  116/84  122/78  122/62  -    Heart Rate (Admit)  102 bpm  95 bpm  71 bpm  86 bpm    Heart Rate (Exercise)  110 bpm  112 bpm  121 bpm  119 bpm    Heart Rate (Exit)  102 bpm  92 bpm  103 bpm  90 bpm    Oxygen Saturation (Admit)  96 %  -  -  -    Oxygen Saturation (Exercise)  93 %  95 %  -  -    Oxygen Saturation (Exit)  97 %  -  -  -    Rating of Perceived Exertion (Exercise)  11  11  13  13     Perceived Dyspnea (Exercise)  2  -  -  -    Symptoms  back pain  none  none  CP, dizziness    Comments  gout L toe  first full day of exercise  -  -    Duration  Progress to 30 minutes of  aerobic without signs/symptoms of physical distress  Progress to 30 minutes of  aerobic without signs/symptoms of physical distress  Continue with 30 min of aerobic exercise without signs/symptoms of physical  distress.  Continue with 30 min of aerobic exercise without signs/symptoms of physical distress.    Intensity  -  THRR New  THRR unchanged  THRR unchanged      Progression   Progression  -  Continue to progress workloads to maintain intensity without signs/symptoms of physical distress.  Continue to progress workloads to maintain intensity without signs/symptoms of physical distress.  Continue  to progress workloads to maintain intensity without signs/symptoms of physical distress.    Average METs  -  2.45  2.66  -      Resistance Training   Training Prescription  -  Yes  Yes  Yes    Weight  -  3 lb  3 lbs  3 lbs    Reps  -  10-15  10-15  10-15      Interval Training   Interval Training  -  No  No  No      Treadmill   MPH  -  -  2.3  2.3    Grade  -  -  1  1    Minutes  -  -  15  12    METs  -  -  3.08  -      NuStep   Level  -  2  2  -    Minutes  -  15  15  -    METs  -  2.9  2.9  -      Biostep-RELP   Level  -  2  8  -    Minutes  -  15  15  -    METs  -  2  2  -      Home Exercise Plan   Plans to continue exercise at  -  -  Home (comment) walk at park  Home (comment) walk at park    Frequency  -  -  Add 3 additional days to program exercise sessions.  Add 3 additional days to program exercise sessions.    Initial Home Exercises Provided  -  -  09/27/18  09/27/18       Exercise Comments: Exercise Comments    Row Name 09/08/18 0910           Exercise Comments  First full day of exercise!  Patient was oriented to gym and equipment including functions, settings, policies, and procedures.  Patient's individual exercise prescription and treatment plan were reviewed.  All starting workloads were established based on the results of the 6 minute walk test done at initial orientation visit.  The plan for exercise progression was also introduced and progression will be customized based on patient's performance and goals.          Exercise Goals and Review: Exercise Goals    Row Name 08/25/18 1500             Exercise Goals   Increase Physical Activity  Yes       Intervention  Provide advice, education, support and counseling about physical activity/exercise needs.;Develop an individualized exercise prescription for aerobic and resistive training based on initial evaluation findings, risk stratification, comorbidities and participant's personal goals.        Expected Outcomes  Short Term: Attend rehab on a regular basis to increase amount of physical activity.;Long Term: Add in home exercise to make exercise part of routine and to increase amount of physical activity.;Long Term: Exercising regularly at least 3-5 days a week.  Increase Strength and Stamina  Yes       Intervention  Provide advice, education, support and counseling about physical activity/exercise needs.;Develop an individualized exercise prescription for aerobic and resistive training based on initial evaluation findings, risk stratification, comorbidities and participant's personal goals.       Expected Outcomes  Short Term: Increase workloads from initial exercise prescription for resistance, speed, and METs.;Short Term: Perform resistance training exercises routinely during rehab and add in resistance training at home;Long Term: Improve cardiorespiratory fitness, muscular endurance and strength as measured by increased METs and functional capacity (6MWT)       Able to understand and use rate of perceived exertion (RPE) scale  Yes       Intervention  Provide education and explanation on how to use RPE scale       Expected Outcomes  Short Term: Able to use RPE daily in rehab to express subjective intensity level;Long Term:  Able to use RPE to guide intensity level when exercising independently       Able to understand and use Dyspnea scale  Yes       Intervention  Provide education and explanation on how to use Dyspnea scale       Expected Outcomes  Short Term: Able to use Dyspnea scale daily in rehab to express subjective sense of shortness of breath during exertion;Long Term: Able to use Dyspnea scale to guide intensity level when exercising independently       Knowledge and understanding of Target Heart Rate Range (THRR)  Yes       Intervention  Provide education and explanation of THRR including how the numbers were predicted and where they are located for reference       Expected  Outcomes  Short Term: Able to state/look up THRR;Short Term: Able to use daily as guideline for intensity in rehab;Long Term: Able to use THRR to govern intensity when exercising independently       Able to check pulse independently  Yes       Intervention  Provide education and demonstration on how to check pulse in carotid and radial arteries.;Review the importance of being able to check your own pulse for safety during independent exercise       Expected Outcomes  Short Term: Able to explain why pulse checking is important during independent exercise;Long Term: Able to check pulse independently and accurately       Understanding of Exercise Prescription  Yes       Intervention  Provide education, explanation, and written materials on patient's individual exercise prescription       Expected Outcomes  Short Term: Able to explain program exercise prescription;Long Term: Able to explain home exercise prescription to exercise independently          Exercise Goals Re-Evaluation : Exercise Goals Re-Evaluation    Row Name 09/08/18 0911 09/22/18 1017 09/27/18 1001 10/11/18 1549 10/24/18 1610     Exercise Goal Re-Evaluation   Exercise Goals Review  Increase Physical Activity;Increase Strength and Stamina;Able to understand and use rate of perceived exertion (RPE) scale;Knowledge and understanding of Target Heart Rate Range (THRR);Understanding of Exercise Prescription  -  Increase Physical Activity;Increase Strength and Stamina;Understanding of Exercise Prescription  Increase Physical Activity;Increase Strength and Stamina;Understanding of Exercise Prescription  -   Comments  Reviewed RPE scale, THR and program prescription with pt today.  Pt voiced understanding and was given a copy of goals to take home.   -  [name] is off to a  good start in rehab.  She has already started to notice that her strength and stamina are starting to come back. She is already walking daily in the morning.  She continues to have  some back pain, but knows strengthening the muscles will help. She also has some sciattica pain in back and legs.  Reviewed home exercise with pt today.  Pt plans to walk at park for exercise.  Reviewed THR, pulse, RPE, sign and symptoms, and when to call 911 or MD.  Also discussed weather considerations and indoor options.  Pt voiced understanding.  Chelsea Ramsey has been doing well in rehab. She has only attended once since last review.  Today, she had a spell on the treadmill with dizziness and chest pain.  She was sent to the ED for further work up.  Troponins were negative.   Once she is able to return we will continue to work on her progress.   out since last review   Expected Outcomes  Short: Use RPE daily to regulate intensity. Long: Follow program prescription in THR.  -  Short: Continue to walk daily.  Long: Continue to attend regularly and exercise more.   Short: Cleared to return to rehab.  Long: Continue to improve strength and stamina.   -      Discharge Exercise Prescription (Final Exercise Prescription Changes): Exercise Prescription Changes - 10/11/18 1500      Response to Exercise   Blood Pressure (Admit)  126/70    Blood Pressure (Exercise)  148/80    Heart Rate (Admit)  86 bpm    Heart Rate (Exercise)  119 bpm    Heart Rate (Exit)  90 bpm    Rating of Perceived Exertion (Exercise)  13    Symptoms  CP, dizziness    Duration  Continue with 30 min of aerobic exercise without signs/symptoms of physical distress.    Intensity  THRR unchanged      Progression   Progression  Continue to progress workloads to maintain intensity without signs/symptoms of physical distress.      Resistance Training   Training Prescription  Yes    Weight  3 lbs    Reps  10-15      Interval Training   Interval Training  No      Treadmill   MPH  2.3    Grade  1    Minutes  12      Home Exercise Plan   Plans to continue exercise at  Home (comment)   walk at park   Frequency  Add 3 additional days to  program exercise sessions.    Initial Home Exercises Provided  09/27/18       Nutrition:  Target Goals: Understanding of nutrition guidelines, daily intake of sodium <1577m, cholesterol <2037m calories 30% from fat and 7% or less from saturated fats, daily to have 5 or more servings of fruits and vegetables.  Biometrics: Pre Biometrics - 08/25/18 1459      Pre Biometrics   Height  5' 6.5" (1.689 m)    Weight  258 lb 9.6 oz (117.3 kg)    Waist Circumference  47 inches    Hip Circumference  54 inches    Waist to Hip Ratio  0.87 %    BMI (Calculated)  41.12    Single Leg Stand  30 seconds        Nutrition Therapy Plan and Nutrition Goals: Nutrition Therapy & Goals - 09/22/18 1133  Nutrition Therapy   Diet  TLC    Protein (specify units)  8oz    Fiber  20 grams    Whole Grain Foods  3 servings   does not currently choose whole grains   Saturated Fats  14 max. grams    Fruits and Vegetables  5 servings/day   8 Ideal; does not regularly eat fruits and vegetables   Sodium  1500 grams      Personal Nutrition Goals   Nutrition Goal  Work to decrease soda and candy consumption. The best solution would be not to buy these items in the first place to reduce temptation, but you can also start by setting goals such as drinking one less glass of soda and more more glass of water per day, or choosing applesauce/pudding rather than a candy bar as a snack    Personal Goal #2  Increase fruit and vegetable intake by at least one additional serving per week. Long term goal would be to eat multiple servings of fruits and vegetables daily    Personal Goal #3  Rather than 'nibbling' on foods throughout the day, work to create a structured eating plan that includes meals/snacks with at least two food groups. This will help improve fullness and satiety when you eat and decrease cravings    Comments  Pt reports wt regain s/p recent surgery which she suspects is due to increase in snacking, which  has taken the place of smoking (trying to quit). She is choosing candy and chips which has lead to her 'craving' these items more now as well. She has also been snacking/ "nibbling" on foods throughout the day rather than having sit-down meals. She has however been trying to reduce sodium intake and no longer adds salt to meals. She does not to out to eat often. For beverage she does drink water but also has been drinking juice regularly and finishes a 3L of soda every two days      Intervention Plan   Intervention  Prescribe, educate and counsel regarding individualized specific dietary modifications aiming towards targeted core components such as weight, hypertension, lipid management, diabetes, heart failure and other comorbidities.    Expected Outcomes  Short Term Goal: Understand basic principles of dietary content, such as calories, fat, sodium, cholesterol and nutrients.;Short Term Goal: A plan has been developed with personal nutrition goals set during dietitian appointment.;Long Term Goal: Adherence to prescribed nutrition plan.       Nutrition Assessments: Nutrition Assessments - 08/25/18 1549      MEDFICTS Scores   Pre Score  125       Nutrition Goals Re-Evaluation: Nutrition Goals Re-Evaluation    [place] Name 09/22/18 1147             Goals   Nutrition Goal  Work to decrease soda and candy consumption. The best solution would be not to buy these items in the first place to reduce temptation, but you can also start by setting goals such as drinking one less glass of soda and one more glass of water per day, or choosing applesauce/ pudding rather than a candy bar as a snack       Comment  Currently she drinks 3L of Coke-a-Cola in two days, drinks juices regularly and chooses candy bars and chips as snacks regularly       Expected Outcome  She will slowly decrease SSB and non-nutritious snack intake until they are either eliminated from her diet or consumed only in moderation.  She  will replace SSB with water and candy/chips with more nutrient-dense snack options         Personal Goal #2 Re-Evaluation   Personal Goal #2  Increase fruit and vegetable intake by at least one additional serving per week. Long term goal would be to eat multiple servings of fruits and vegetables daily         Personal Goal #3 Re-Evaluation   Personal Goal #3  Rather than 'nibbling' on foods througout the day, work to create a structured eating plan that includes meals/snacks with at least two food groups. This will help improve fullness and satiety when you eat and decrease cravings          Nutrition Goals Discharge (Final Nutrition Goals Re-Evaluation): Nutrition Goals Re-Evaluation - 09/22/18 1147      Goals   Nutrition Goal  Work to decrease soda and candy consumption. The best solution would be not to buy these items in the first place to reduce temptation, but you can also start by setting goals such as drinking one less glass of soda and one more glass of water per day, or choosing applesauce/ pudding rather than a candy bar as a snack    Comment  Currently she drinks 3L of Coke-a-Cola in two days, drinks juices regularly and chooses candy bars and chips as snacks regularly    Expected Outcome  She will slowly decrease SSB and non-nutritious snack intake until they are either eliminated from her diet or consumed only in moderation. She will replace SSB with water and candy/chips with more nutrient-dense snack options      Personal Goal #2 Re-Evaluation   Personal Goal #2  Increase fruit and vegetable intake by at least one additional serving per week. Long term goal would be to eat multiple servings of fruits and vegetables daily      Personal Goal #3 Re-Evaluation   Personal Goal #3  Rather than 'nibbling' on foods througout the day, work to create a structured eating plan that includes meals/snacks with at least two food groups. This will help improve fullness and satiety when you eat  and decrease cravings       Psychosocial: Target Goals: Acknowledge presence or absence of significant depression and/or stress, maximize coping skills, provide positive support system. Participant is able to verbalize types and ability to use techniques and skills needed for reducing stress and depression.   Initial Review & Psychosocial Screening: Initial Psych Review & Screening - 08/25/18 1543      Initial Review   Current issues with  Current Sleep Concerns;Current Stress Concerns   has been sleeping on the couch since surgery (3 months ago) because of sternal discomfort. Wants to start sleeping in bed.    Source of Stress Concerns  Transportation;Unable to perform yard/household activities;Financial    Comments  Does not drive. Relies on family members to drive. Wants to get back to hobbies/doing house work/ building up stamina.       Family Dynamics   Good Support System?  Yes   mother and aunt     Screening Interventions   Interventions  Encouraged to exercise;Program counselor consult;To provide support and resources with identified psychosocial needs;Provide feedback about the scores to participant    Expected Outcomes  Short Term goal: Utilizing psychosocial counselor, staff and physician to assist with identification of specific Stressors or current issues interfering with healing process. Setting desired goal for each stressor or current issue identified.;Long Term Goal: Stressors or current  issues are controlled or eliminated.;Short Term goal: Identification and review with participant of any Quality of Life or Depression concerns found by scoring the questionnaire.;Long Term goal: The participant improves quality of Life and PHQ9 Scores as seen by post scores and/or verbalization of changes       Quality of Life Scores:  Quality of Life - 08/25/18 1545      Quality of Life   Select  Quality of Life      Quality of Life Scores   Health/Function Pre  16.4 %     Socioeconomic Pre  22.29 %    Psych/Spiritual Pre  18 %    Family Pre  27 %    GLOBAL Pre  19.27 %      Scores of 19 and below usually indicate a poorer quality of life in these areas.  A difference of  2-3 points is a clinically meaningful difference.  A difference of 2-3 points in the total score of the Quality of Life Index has been associated with significant improvement in overall quality of life, self-image, physical symptoms, and general health in studies assessing change in quality of life.  PHQ-9: Recent Review Flowsheet Data    Depression screen [facility] 2/9 08/25/2018 07/26/2018   Decreased Interest 1 0   Down, Depressed, Hopeless 0 0   PHQ - 2 Score 1 0   Altered sleeping 2 -   Tired, decreased energy 2 -   Change in appetite 1 -   Feeling bad or failure about yourself  0 -   Trouble concentrating 2 -   Moving slowly or fidgety/restless 0 -   Suicidal thoughts 0 -   PHQ-9 Score 8 -   Difficult doing work/chores Not difficult at all -     Interpretation of Total Score  Total Score Depression Severity:  1-4 = Minimal depression, 5-9 = Mild depression, 10-14 = Moderate depression, 15-19 = Moderately severe depression, 20-27 = Severe depression   Psychosocial Evaluation and Intervention: Psychosocial Evaluation - 09/22/18 1039      Psychosocial Evaluation & Interventions   Interventions  Stress management education;Relaxation education;Encouraged to exercise with the program and follow exercise prescription;Therapist referral    Comments  Counselor met with Ms. Stanfield today [facility]) for initial psychosocial evaluation.  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  [name] has multiple issues with Fibromyalgia; COPD; CHF: Pulmonary Lung Disease; Osteoarthritis; and OSA in addition to her Cardiac condition.  She has a strong support system with a spouse of 23 years; parents who live locally and active involvement in her local church.  [place] with  interrupted sleep and had a sleep study before her surgery and is awaiting a CPAP as a result.  Her appetite is up and down currently.  [name] reports a long history of depression and anxiety with some panic attacks.  She reports her Dr. just prescribed new meds for this but they are not working and she "feels worse" with more "irritability."  Counselor encouraged her to let her Dr. know this asap.  She has a trauma history that has never been addressed fully with the loss of an infant at 85 days old (her only child).  [name] has multiple stressors with her health as well as unresolved grief and trauma.  counselor provided contact info for a therapist locally, and [name] agreed to pursue this option.  She has goals to lose weight; decrease stress; improve her sleep  quality; and increase her stamina and strength while in this program.      Expected Outcomes  Short:  Chelsea Ramsey will contact a therapist locally to support her journey of grief and unresolved trauma.  She also will exercise for her health and her mental health - to cope with stress better.  Long:  [name] will make positive self-care choices with diet and exercise to improve her health and mental health.    Continue Psychosocial Services   Follow up required by staff       Psychosocial Re-Evaluation:   Psychosocial Discharge (Final Psychosocial Re-Evaluation):   Vocational Rehabilitation: Provide vocational rehab assistance to qualifying candidates.   Vocational Rehab Evaluation & Intervention: Vocational Rehab - 08/25/18 1542      Initial Vocational Rehab Evaluation & Intervention   Assessment shows need for Vocational Rehabilitation  Yes    Vocational Rehab Packet given to patient  --   paperwork will be given first day of class      Education: Education Goals: Education classes will be provided on a variety of topics geared toward better understanding of heart health and risk factor modification. Participant will state understanding/return  demonstration of topics presented as noted by education test scores.  Learning Barriers/Preferences: Learning Barriers/Preferences - 08/25/18 1541      Learning Barriers/Preferences   Learning Barriers  None    Learning Preferences  Individual Instruction       Education Topics:  AED/CPR: - Group verbal and written instruction with the use of models to demonstrate the basic use of the AED with the basic ABC's of resuscitation.   General Nutrition Guidelines/Fats and Fiber: -Group instruction provided by verbal, written material, models and posters to present the general guidelines for heart healthy nutrition. Gives an explanation and review of dietary fats and fiber.   Controlling Sodium/Reading Food Labels: -Group verbal and written material supporting the discussion of sodium use in heart healthy nutrition. Review and explanation with models, verbal and written materials for utilization of the food label.   Exercise Physiology & General Exercise Guidelines: - Group verbal and written instruction with models to review the exercise physiology of the cardiovascular system and associated critical values. Provides general exercise guidelines with specific guidelines to those with heart or lung disease.    Aerobic Exercise & Resistance Training: - Gives group verbal and written instruction on the various components of exercise. Focuses on aerobic and resistive training programs and the benefits of this training and how to safely progress through these programs..   Flexibility, Balance, Mind/Body Relaxation: Provides group verbal/written instruction on the benefits of flexibility and balance training, including mind/body exercise modes such as yoga, pilates and tai chi.  Demonstration and skill practice provided.   Stress and Anxiety: - Provides group verbal and written instruction about the health risks of elevated stress and causes of high stress.  Discuss the correlation between  heart/lung disease and anxiety and treatment options. Review healthy ways to manage with stress and anxiety.   Cardiac Rehab from 10/11/2018 in [facility] Cardiac and Pulmonary Rehab  Date  09/13/18  Educator  [facility]  Instruction Review Code  1- Verbalizes Understanding      Depression: - Provides group verbal and written instruction on the correlation between heart/lung disease and depressed mood, treatment options, and the stigmas associated with seeking treatment.   Anatomy & Physiology of the Heart: - Group verbal and written instruction and models provide basic cardiac anatomy and physiology, with the coronary electrical and arterial  systems. Review of Valvular disease and Heart Failure   Cardiac Procedures: - Group verbal and written instruction to review commonly prescribed medications for heart disease. Reviews the medication, class of the drug, and side effects. Includes the steps to properly store meds and maintain the prescription regimen. (beta blockers and nitrates)   Cardiac Rehab from 10/11/2018 in [facility] Cardiac and Pulmonary Rehab  Date  09/27/18  Educator  [place]  Instruction Review Code  1- Verbalizes Understanding      Cardiac Medications I: - Group verbal and written instruction to review commonly prescribed medications for heart disease. Reviews the medication, class of the drug, and side effects. Includes the steps to properly store meds and maintain the prescription regimen.   Cardiac Medications II: -Group verbal and written instruction to review commonly prescribed medications for heart disease. Reviews the medication, class of the drug, and side effects. (all other drug classes)   Cardiac Rehab from 10/11/2018 in [facility] Cardiac and Pulmonary Rehab  Date  09/08/18  Educator  CE  Instruction Review Code  1- Verbalizes Understanding       Go Sex-Intimacy & Heart Disease, Get SMART - Goal Setting: - Group verbal and written instruction through game format to discuss heart  disease and the return to sexual intimacy. Provides group verbal and written material to discuss and apply goal setting through the application of the S.M.A.R.T. Method.   Cardiac Rehab from 10/11/2018 in [facility] Cardiac and Pulmonary Rehab  Date  09/27/18  Educator  [place]  Instruction Review Code  1- Verbalizes Understanding      Other Matters of the Heart: - Provides group verbal, written materials and models to describe Stable Angina and Peripheral Artery. Includes description of the disease process and treatment options available to the cardiac patient.   Exercise & Equipment Safety: - Individual verbal instruction and demonstration of equipment use and safety with use of the equipment.   Cardiac Rehab from 10/11/2018 in [facility] Cardiac and Pulmonary Rehab  Date  08/25/18  Educator  [facility]  Instruction Review Code  1- Verbalizes Understanding      Infection Prevention: - Provides verbal and written material to individual with discussion of infection control including proper hand washing and proper equipment cleaning during exercise session.   Cardiac Rehab from 10/11/2018 in [facility] Cardiac and Pulmonary Rehab  Date  08/25/18  Educator  [facility]  Instruction Review Code  1- Verbalizes Understanding      Falls Prevention: - Provides verbal and written material to individual with discussion of falls prevention and safety.   Cardiac Rehab from 10/11/2018 in [name] Cardiac and Pulmonary Rehab  Date  08/25/18  Educator  [facility]  Instruction Review Code  1- Verbalizes Understanding      Diabetes: - Individual verbal and written instruction to review signs/symptoms of diabetes, desired ranges of glucose level fasting, after meals and with exercise. Acknowledge that pre and post exercise glucose checks will be done for 3 sessions at entry of program.   Know Your Numbers and Risk Factors: -Group verbal and written instruction about important numbers in your health.  Discussion of what are risk factors and  how they play a role in the disease process.  Review of Cholesterol, Blood Pressure, Diabetes, and BMI and the role they play in your overall health.   Cardiac Rehab from 10/11/2018 in [facility] Cardiac and Pulmonary Rehab  Date  09/08/18  Educator  CE  Instruction Review Code  1- Verbalizes Understanding      Sleep  Hygiene: -Provides group verbal and written instruction about how sleep can affect your health.  Define sleep hygiene, discuss sleep cycles and impact of sleep habits. Review good sleep hygiene tips.    Cardiac Rehab from 10/11/2018 in [facility] Cardiac and Pulmonary Rehab  Date  10/11/18  Educator  [facility]  Instruction Review Code  1- Verbalizes Understanding      Other: -Provides group and verbal instruction on various topics (see comments)   Knowledge Questionnaire Score: Knowledge Questionnaire Score - 08/25/18 1541      Knowledge Questionnaire Score   Pre Score  19/26   correct answers reviewed with [facility], focus on exercise, nutrition, and MI      Core Components/Risk Factors/Patient Goals at Admission: Personal Goals and Risk Factors at Admission - 08/25/18 1539      Core Components/Risk Factors/Patient Goals on Admission    Weight Management  Yes;Obesity;Weight Loss    Intervention  Weight Management: Develop a combined nutrition and exercise program designed to reach desired caloric intake, while maintaining appropriate intake of nutrient and fiber, sodium and fats, and appropriate energy expenditure required for the weight goal.;Weight Management: Provide education and appropriate resources to help participant work on and attain dietary goals.;Weight Management/Obesity: Establish reasonable short term and long term weight goals.;Obesity: Provide education and appropriate resources to help participant work on and attain dietary goals.    Admit Weight  258 lb 9.6 oz (117.3 kg)    Goal Weight: Short Term  253 lb (114.8 kg)    Goal Weight: Long Term  200 lb (90.7 kg)     Expected Outcomes  Short Term: Continue to assess and modify interventions until short term weight is achieved;Long Term: Adherence to nutrition and physical activity/exercise program aimed toward attainment of established weight goal;Weight Loss: Understanding of general recommendations for a balanced deficit meal plan, which promotes 1-2 lb weight loss per week and includes a negative energy balance of [phone] kcal/d;Understanding recommendations for meals to include 15-35% energy as protein, 25-35% energy from fat, 35-60% energy from carbohydrates, less than 26m of dietary cholesterol, 20-35 gm of total fiber daily;Understanding of distribution of calorie intake throughout the day with the consumption of 4-5 meals/snacks    Tobacco Cessation  Yes    Intervention  Assist the participant in steps to quit. Provide individualized education and counseling about committing to Tobacco Cessation, relapse prevention, and pharmacological support that can be provided by physician.;OAdvice worker assist with locating and accessing local/national Quit Smoking programs, and support quit date choice.    Expected Outcomes  Short Term: Will demonstrate readiness to quit, by selecting a quit date.;Short Term: Will quit all tobacco product use, adhering to prevention of relapse plan.;Long Term: Complete abstinence from all tobacco products for at least 12 months from quit date.    Heart Failure  Yes    Intervention  Provide a combined exercise and nutrition program that is supplemented with education, support and counseling about heart failure. Directed toward relieving symptoms such as shortness of breath, decreased exercise tolerance, and extremity edema.    Expected Outcomes  Improve functional capacity of life;Short term: Attendance in program 2-3 days a week with increased exercise capacity. Reported lower sodium intake. Reported increased fruit and vegetable intake. Reports medication  compliance.;Short term: Daily weights obtained and reported for increase. Utilizing diuretic protocols set by physician.;Long term: Adoption of self-care skills and reduction of barriers for early signs and symptoms recognition and intervention leading to self-care maintenance.    Hypertension  Yes    Intervention  Provide education on lifestyle modifcations including regular physical activity/exercise, weight management, moderate sodium restriction and increased consumption of fresh fruit, vegetables, and low fat dairy, alcohol moderation, and smoking cessation.;Monitor prescription use compliance.    Expected Outcomes  Short Term: Continued assessment and intervention until BP is < 140/54m HG in hypertensive participants. < 130/833mHG in hypertensive participants with diabetes, heart failure or chronic kidney disease.;Long Term: Maintenance of blood pressure at goal levels.       Core Components/Risk Factors/Patient Goals Review:  Goals and Risk Factor Review    Row Name 09/27/18 1003             Core Components/Risk Factors/Patient Goals Review   Personal Goals Review  Heart Failure;Weight Management/Obesity;Hypertension;Improve shortness of breath with ADL's       Review  SaJolaine Clicks off to a good start in rehab. She has already started to lose some weight and is down to 156lbs.  She has cut back on soda and candy bars.  She weighs daily and checks blood pressure and saturations daily as well!!  She has been having some shortness of breath and is using her oxygen at night and occasionally during the day. She still has some incisional pain but knows that she is getting better.  Still within her recovery window.  She is doing well with her medicaitons.        Expected Outcomes  Short: Continue to weigh daily and work weight loss.  Long: Continue to manage heart failure.           Core Components/Risk Factors/Patient Goals at Discharge (Final Review):  Goals and Risk Factor Review - 09/27/18  1003      Core Components/Risk Factors/Patient Goals Review   Personal Goals Review  Heart Failure;Weight Management/Obesity;Hypertension;Improve shortness of breath with ADL's    Review  SaJolaine Clicks off to a good start in rehab. She has already started to lose some weight and is down to 156lbs.  She has cut back on soda and candy bars.  She weighs daily and checks blood pressure and saturations daily as well!!  She has been having some shortness of breath and is using her oxygen at night and occasionally during the day. She still has some incisional pain but knows that she is getting better.  Still within her recovery window.  She is doing well with her medicaitons.     Expected Outcomes  Short: Continue to weigh daily and work weight loss.  Long: Continue to manage heart failure.        ITP Comments: ITP Comments    Row Name 08/25/18 1531 09/07/18 0607 10/05/18 0555 10/11/18 1304 10/24/18 1609   ITP Comments  Med Review completed. Initial ITP created. [facility]  30 day review completed. ITP sent to Dr. MaEmily FilbertMedical Director of Cardiac Rehab. Continue with ITP unless changes are made by physician  Starts sessions on 10/10  30 day review. Continue with ITP unless direccted changes per Medical Director Chart Review.  Pt experienced dizziness while walking on the treadmill. Assisted patient to seating. Patient then reported nausea and chest pain. She was taken to the Emergency Department for further evaluation. BP 148/80, O2 100%, HR 90.  Called to check on pt.  [facility] with high PHQ and she left without being seen.  She has not returned to rehab.  Left message.  [place] Name 11/02/18 0559           ITP Comments  30 day review. Continue with ITP unless direccted changes per Medical Director Chart Review. Still not returned does have clearance to return          Comments:

## 2018-11-04 ENCOUNTER — Emergency Department: Payer: Medicaid Other

## 2018-11-04 ENCOUNTER — Observation Stay
Admission: EM | Admit: 2018-11-04 | Discharge: 2018-11-06 | Disposition: A | Discharge status: Home / Self Care | Payer: Medicaid Other | Attending: Internal Medicine | Admitting: Internal Medicine

## 2018-11-04 ENCOUNTER — Other Ambulatory Visit: Payer: Self-pay

## 2018-11-04 DIAGNOSIS — I272 Pulmonary hypertension, unspecified: Secondary | ICD-10-CM | POA: Diagnosis not present

## 2018-11-04 DIAGNOSIS — Z8249 Family history of ischemic heart disease and other diseases of the circulatory system: Secondary | ICD-10-CM | POA: Diagnosis not present

## 2018-11-04 DIAGNOSIS — I110 Hypertensive heart disease with heart failure: Secondary | ICD-10-CM | POA: Insufficient documentation

## 2018-11-04 DIAGNOSIS — R079 Chest pain, unspecified: Secondary | ICD-10-CM | POA: Diagnosis present

## 2018-11-04 DIAGNOSIS — J449 Chronic obstructive pulmonary disease, unspecified: Secondary | ICD-10-CM | POA: Diagnosis not present

## 2018-11-04 DIAGNOSIS — I482 Chronic atrial fibrillation, unspecified: Secondary | ICD-10-CM | POA: Insufficient documentation

## 2018-11-04 DIAGNOSIS — Z79891 Long term (current) use of opiate analgesic: Secondary | ICD-10-CM | POA: Insufficient documentation

## 2018-11-04 DIAGNOSIS — Z23 Encounter for immunization: Secondary | ICD-10-CM | POA: Insufficient documentation

## 2018-11-04 DIAGNOSIS — Z952 Presence of prosthetic heart valve: Secondary | ICD-10-CM | POA: Diagnosis not present

## 2018-11-04 DIAGNOSIS — Z8711 Personal history of peptic ulcer disease: Secondary | ICD-10-CM | POA: Insufficient documentation

## 2018-11-04 DIAGNOSIS — Z7901 Long term (current) use of anticoagulants: Secondary | ICD-10-CM | POA: Insufficient documentation

## 2018-11-04 DIAGNOSIS — I491 Atrial premature depolarization: Secondary | ICD-10-CM | POA: Diagnosis not present

## 2018-11-04 DIAGNOSIS — M1990 Unspecified osteoarthritis, unspecified site: Secondary | ICD-10-CM | POA: Insufficient documentation

## 2018-11-04 DIAGNOSIS — G4730 Sleep apnea, unspecified: Secondary | ICD-10-CM | POA: Insufficient documentation

## 2018-11-04 DIAGNOSIS — M797 Fibromyalgia: Secondary | ICD-10-CM | POA: Diagnosis not present

## 2018-11-04 DIAGNOSIS — M533 Sacrococcygeal disorders, not elsewhere classified: Secondary | ICD-10-CM | POA: Insufficient documentation

## 2018-11-04 DIAGNOSIS — R002 Palpitations: Secondary | ICD-10-CM | POA: Diagnosis not present

## 2018-11-04 DIAGNOSIS — F419 Anxiety disorder, unspecified: Secondary | ICD-10-CM | POA: Diagnosis not present

## 2018-11-04 DIAGNOSIS — I2510 Atherosclerotic heart disease of native coronary artery without angina pectoris: Secondary | ICD-10-CM | POA: Insufficient documentation

## 2018-11-04 DIAGNOSIS — G894 Chronic pain syndrome: Secondary | ICD-10-CM | POA: Diagnosis not present

## 2018-11-04 DIAGNOSIS — F17210 Nicotine dependence, cigarettes, uncomplicated: Secondary | ICD-10-CM | POA: Diagnosis not present

## 2018-11-04 DIAGNOSIS — K760 Fatty (change of) liver, not elsewhere classified: Secondary | ICD-10-CM | POA: Diagnosis not present

## 2018-11-04 DIAGNOSIS — Z79899 Other long term (current) drug therapy: Secondary | ICD-10-CM | POA: Diagnosis not present

## 2018-11-04 DIAGNOSIS — M25561 Pain in right knee: Secondary | ICD-10-CM | POA: Insufficient documentation

## 2018-11-04 DIAGNOSIS — I5032 Chronic diastolic (congestive) heart failure: Secondary | ICD-10-CM | POA: Insufficient documentation

## 2018-11-04 DIAGNOSIS — R55 Syncope and collapse: Secondary | ICD-10-CM

## 2018-11-04 DIAGNOSIS — K219 Gastro-esophageal reflux disease without esophagitis: Secondary | ICD-10-CM | POA: Insufficient documentation

## 2018-11-04 LAB — CBC WITH DIFFERENTIAL/PLATELET
Abs Immature Granulocytes: 0.05 10*3/uL (ref 0.00–0.07)
Basophils Absolute: 0 10*3/uL (ref 0.0–0.1)
Basophils Relative: 0 %
EOS PCT: 1 %
Eosinophils Absolute: 0.1 10*3/uL (ref 0.0–0.5)
HEMATOCRIT: 39.5 % (ref 36.0–46.0)
HEMOGLOBIN: 11.9 g/dL — AB (ref 12.0–15.0)
Immature Granulocytes: 1 %
Lymphocytes Relative: 19 %
Lymphs Abs: 1.8 10*3/uL (ref 0.7–4.0)
MCH: 24.1 pg — AB (ref 26.0–34.0)
MCHC: 30.1 g/dL (ref 30.0–36.0)
MCV: 80 fL (ref 80.0–100.0)
MONO ABS: 0.6 10*3/uL (ref 0.1–1.0)
Monocytes Relative: 6 %
Neutro Abs: 6.8 10*3/uL (ref 1.7–7.7)
Neutrophils Relative %: 73 %
Platelets: 248 10*3/uL (ref 150–400)
RBC: 4.94 MIL/uL (ref 3.87–5.11)
RDW: 17.4 % — ABNORMAL HIGH (ref 11.5–15.5)
WBC: 9.4 10*3/uL (ref 4.0–10.5)
nRBC: 0 % (ref 0.0–0.2)

## 2018-11-04 LAB — COMPREHENSIVE METABOLIC PANEL
ALBUMIN: 3.7 g/dL (ref 3.5–5.0)
ALK PHOS: 109 U/L (ref 38–126)
ALT: 16 U/L (ref 0–44)
ANION GAP: 8 (ref 5–15)
AST: 25 U/L (ref 15–41)
BILIRUBIN TOTAL: 0.7 mg/dL (ref 0.3–1.2)
BUN: 11 mg/dL (ref 6–20)
CALCIUM: 9 mg/dL (ref 8.9–10.3)
CO2: 29 mmol/L (ref 22–32)
Chloride: 103 mmol/L (ref 98–111)
Creatinine, Ser: 0.47 mg/dL (ref 0.44–1.00)
GFR calc non Af Amer: >60 mL/min (ref >60)
GLUCOSE: 105 mg/dL — AB (ref 70–99)
Potassium: 4 mmol/L (ref 3.5–5.1)
Sodium: 140 mmol/L (ref 135–145)
TOTAL PROTEIN: 7.1 g/dL (ref 6.5–8.1)

## 2018-11-04 LAB — LIPASE, BLOOD: Lipase: 38 U/L (ref 11–51)

## 2018-11-04 LAB — PROTIME-INR
INR: 1.47
Prothrombin Time: 17.7 seconds — ABNORMAL HIGH (ref 11.4–15.2)

## 2018-11-04 LAB — TROPONIN I
Troponin I: <0.03 ng/mL (ref <0.03)
Troponin I: <0.03 ng/mL (ref <0.03)
Troponin I: <0.03 ng/mL (ref <0.03)

## 2018-11-04 MED ORDER — RIVAROXABAN 20 MG PO TABS
20.0000 mg | ORAL_TABLET | Freq: Every day | ORAL | Status: DC
  Administered 2018-11-04 – 2018-11-05 (×2): 20 mg via ORAL
  Filled 2018-11-04 (×2): qty 1

## 2018-11-04 MED ORDER — CLOPIDOGREL BISULFATE 75 MG PO TABS
300.0000 mg | ORAL_TABLET | Freq: Once | ORAL | Status: AC
  Administered 2018-11-04: 300 mg via ORAL
  Filled 2018-11-04: qty 4

## 2018-11-04 MED ORDER — INFLUENZA VAC SPLIT QUAD 0.5 ML IM SUSY
0.5000 mL | PREFILLED_SYRINGE | INTRAMUSCULAR | Status: AC
  Administered 2018-11-05: 0.5 mL via INTRAMUSCULAR
  Filled 2018-11-04 (×2): qty 0.5

## 2018-11-04 MED ORDER — ACETAMINOPHEN 325 MG PO TABS: 650.0000 mg | ORAL_TABLET | ORAL | Status: DC | PRN

## 2018-11-04 MED ORDER — NITROGLYCERIN 0.4 MG SL SUBL
0.4000 mg | SUBLINGUAL_TABLET | SUBLINGUAL | Status: DC | PRN
  Filled 2018-11-04: qty 1

## 2018-11-04 MED ORDER — NICOTINE 7 MG/24HR TD PT24
7.0000 mg | MEDICATED_PATCH | Freq: Every day | TRANSDERMAL | Status: DC
  Administered 2018-11-04 – 2018-11-06 (×3): 7 mg via TRANSDERMAL
  Filled 2018-11-04 (×4): qty 1

## 2018-11-04 MED ORDER — TOPIRAMATE 25 MG PO TABS
25.0000 mg | ORAL_TABLET | Freq: Two times a day (BID) | ORAL | Status: DC
  Administered 2018-11-04 – 2018-11-06 (×4): 25 mg via ORAL
  Filled 2018-11-04 (×5): qty 1

## 2018-11-04 MED ORDER — BUMETANIDE 1 MG PO TABS
4.0000 mg | ORAL_TABLET | Freq: Every day | ORAL | Status: DC
  Administered 2018-11-05 – 2018-11-06 (×2): 4 mg via ORAL
  Filled 2018-11-04 (×2): qty 4

## 2018-11-04 MED ORDER — IPRATROPIUM-ALBUTEROL 0.5-2.5 (3) MG/3ML IN SOLN
3.0000 mL | Freq: Two times a day (BID) | RESPIRATORY_TRACT | Status: DC | PRN
  Administered 2018-11-04 – 2018-11-05 (×2): 3 mL via RESPIRATORY_TRACT
  Filled 2018-11-04 (×2): qty 3

## 2018-11-04 MED ORDER — IOPAMIDOL (ISOVUE-370) INJECTION 76%
100.0000 mL | Freq: Once | INTRAVENOUS | Status: AC | PRN
  Administered 2018-11-04: 100 mL via INTRAVENOUS

## 2018-11-04 MED ORDER — PROMETHAZINE HCL 25 MG/ML IJ SOLN
25.0000 mg | Freq: Once | INTRAMUSCULAR | Status: AC
  Administered 2018-11-04: 25 mg via INTRAVENOUS
  Filled 2018-11-04: qty 1

## 2018-11-04 MED ORDER — MAGNESIUM OXIDE 400 (241.3 MG) MG PO TABS
400.0000 mg | ORAL_TABLET | Freq: Two times a day (BID) | ORAL | Status: DC
  Administered 2018-11-04 – 2018-11-06 (×4): 400 mg via ORAL
  Filled 2018-11-04 (×4): qty 1

## 2018-11-04 MED ORDER — TIZANIDINE HCL 4 MG PO TABS
4.0000 mg | ORAL_TABLET | Freq: Every day | ORAL | Status: DC
  Administered 2018-11-04 – 2018-11-05 (×2): 4 mg via ORAL
  Filled 2018-11-04 (×3): qty 1

## 2018-11-04 MED ORDER — METOPROLOL TARTRATE 50 MG PO TABS
100.0000 mg | ORAL_TABLET | Freq: Two times a day (BID) | ORAL | Status: DC
  Administered 2018-11-04 – 2018-11-06 (×3): 100 mg via ORAL
  Filled 2018-11-04 (×4): qty 2

## 2018-11-04 MED ORDER — PANTOPRAZOLE SODIUM 40 MG PO TBEC
40.0000 mg | DELAYED_RELEASE_TABLET | Freq: Every day | ORAL | Status: DC
  Administered 2018-11-05 – 2018-11-06 (×2): 40 mg via ORAL
  Filled 2018-11-04 (×2): qty 1

## 2018-11-04 MED ORDER — NITROGLYCERIN IN D5W 200-5 MCG/ML-% IV SOLN
0.0000 ug/min | INTRAVENOUS | Status: DC
  Filled 2018-11-04: qty 250

## 2018-11-04 MED ORDER — ONDANSETRON HCL 4 MG/2ML IJ SOLN
4.0000 mg | Freq: Four times a day (QID) | INTRAMUSCULAR | Status: DC | PRN
  Administered 2018-11-04 – 2018-11-06 (×2): 4 mg via INTRAVENOUS
  Filled 2018-11-04 (×2): qty 2

## 2018-11-04 MED ORDER — GABAPENTIN 300 MG PO CAPS
600.0000 mg | ORAL_CAPSULE | Freq: Three times a day (TID) | ORAL | Status: DC
  Administered 2018-11-04 – 2018-11-06 (×6): 600 mg via ORAL
  Filled 2018-11-04 (×6): qty 2

## 2018-11-04 MED ORDER — MORPHINE SULFATE (PF) 2 MG/ML IV SOLN
2.0000 mg | INTRAVENOUS | Status: DC | PRN
  Administered 2018-11-04 – 2018-11-05 (×2): 2 mg via INTRAVENOUS
  Filled 2018-11-04 (×2): qty 1

## 2018-11-04 MED ORDER — METOPROLOL TARTRATE 50 MG PO TABS: 75.0000 mg | ORAL_TABLET | Freq: Two times a day (BID) | ORAL | Status: DC

## 2018-11-04 MED ORDER — MOMETASONE FURO-FORMOTEROL FUM 200-5 MCG/ACT IN AERO
2.0000 | INHALATION_SPRAY | Freq: Two times a day (BID) | RESPIRATORY_TRACT | Status: DC
  Administered 2018-11-04 – 2018-11-06 (×4): 2 via RESPIRATORY_TRACT
  Filled 2018-11-04: qty 8.8

## 2018-11-04 MED ORDER — BISACODYL 5 MG PO TBEC
10.0000 mg | DELAYED_RELEASE_TABLET | Freq: Every day | ORAL | Status: DC | PRN
  Administered 2018-11-06: 10 mg via ORAL
  Filled 2018-11-04: qty 2

## 2018-11-04 MED ORDER — OXYCODONE-ACETAMINOPHEN 5-325 MG PO TABS
1.0000 | ORAL_TABLET | Freq: Three times a day (TID) | ORAL | Status: DC | PRN
  Administered 2018-11-04 – 2018-11-06 (×5): 2 via ORAL
  Filled 2018-11-04 (×5): qty 2

## 2018-11-04 MED ORDER — LACTULOSE 10 GM/15ML PO SOLN
30.0000 g | Freq: Two times a day (BID) | ORAL | Status: DC | PRN
  Administered 2018-11-05: 30 g via ORAL
  Filled 2018-11-04: qty 60

## 2018-11-04 MED ORDER — HYDROMORPHONE HCL 1 MG/ML IJ SOLN
0.5000 mg | Freq: Once | INTRAMUSCULAR | Status: AC
  Administered 2018-11-04: 0.5 mg via INTRAVENOUS
  Filled 2018-11-04: qty 1

## 2018-11-04 MED ORDER — BUTALBITAL-APAP-CAFFEINE 50-325-40 MG PO TABS: 1.0000 | ORAL_TABLET | Freq: Four times a day (QID) | ORAL | Status: DC | PRN

## 2018-11-04 NOTE — ED Notes (Signed)
Pt refused nitro tablet states it makes her nauseous and gets a headache. Pt assured headache is common side effect and pt receiving phenergan at this time. Pt still refuses MD made aware.

## 2018-11-04 NOTE — Progress Notes (Signed)
Family Meeting Note  Advance Directive:yes  Today a meeting took place with the Patient.   The following clinical team members were present during this meeting:MD  The following were discussed:Patient's diagnosis: Chest pain with recent history of open heart surgery and mitral valve replacement, chronic diastolic congestive heart failure, fibromyalgia, chronic pain syndrome, hypertension, atrial fibrillation on Xarelto, tobacco abuse disorder, treatment plan of care discussed in detail with the patient.  She verbalized understanding of the plan.    Patient's progosis: Unable to determine and Goals for treatment: Full Code.  Mom Beverlee Nims is the healthcare power of attorney  Additional follow-up to be provided: Hospitalist, cardiology  Time spent during discussion:17 min  Nicholes Mango, MD

## 2018-11-04 NOTE — H&P (Signed)
Chelsea Ramsey at [place] NAME: Chelsea Ramsey    MR#:  [id number]  DATE OF BIRTH:  [birth year]  DATE OF ADMISSION:  11/04/2018  PRIMARY CARE PHYSICIAN: Kathee Delton, MD   REQUESTING/REFERRING PHYSICIAN: Joni Fears  CHIEF COMPLAINT:  Chest pain  HISTORY OF PRESENT ILLNESS:  Chelsea Ramsey  is a 52 y.o. female with a known history of rheumatic heart disease, CHF, COPD, coronary artery disease, hypertension with recent history of mitral valve replacement at [facility] 4 months ago, in July is presenting to the ED with a chief complaint of intermittent episodes of chest pain for the past 3 weeks.  Reports that chest pain is getting worse with exertion.  Initial troponin is negative in the ED.  PAST MEDICAL HISTORY:   Past Medical History:  Diagnosis Date  . Allergy    seasonal  . Anxiety   . Arthritis    Right Knee  . Asthma   . CHF (congestive heart failure) ([place])   . COPD (chronic obstructive pulmonary disease) ([place])   . Coronary artery disease    Leaky heart valve  . Fibromyalgia   . GERD (gastroesophageal reflux disease)   . Hypertension   . Pneumonia   . PUD (peptic ulcer disease)   . Pulmonary HTN ([place])   . Rheumatic fever/heart disease   . Sleep apnea     PAST SURGICAL HISTOIRY:   Past Surgical History:  Procedure Laterality Date  . ADENOIDECTOMY    . ESOPHAGOGASTRODUODENOSCOPY (EGD) WITH PROPOFOL N/A 05/25/2018   Procedure: ESOPHAGOGASTRODUODENOSCOPY (EGD) WITH PROPOFOL;  Surgeon: Lucilla Lame, MD;  Location[facility] ENDOSCOPY;  Service: Endoscopy;  Laterality: N/A;  . MITRAL VALVE REPLACEMENT    . MULTIPLE TOOTH EXTRACTIONS    . TONSILLECTOMY    . TOTAL KNEE ARTHROPLASTY Right 09/04/2016   Procedure: TOTAL KNEE ARTHROPLASTY; with lateral release;  Surgeon: Earlie Server, MD;  Location: [place];  Service: Orthopedics;  Laterality: Right;    SOCIAL HISTORY:   Social History   Tobacco Use  . Smoking  status: Current Every Day Smoker    Packs/day: 0.50    Types: Cigarettes  . Smokeless tobacco: Never Used  Substance Use Topics  . Alcohol use: Not Currently    Alcohol/week: 3.0 standard drinks    Types: 3 Cans of beer per week    Comment: 16 oz per week    FAMILY HISTORY:   Family History  Problem Relation Age of Onset  . COPD Mother   . Cancer Mother        Bone  . Asthma Mother   . Congestive Heart Failure Father     DRUG ALLERGIES:   Allergies  Allergen Reactions  . Amiodarone Nausea And Vomiting  . Aspirin Swelling  . Flexeril [Cyclobenzaprine] Swelling  . Trazamine [Trazodone & Diet Manage Prod] Nausea And Vomiting  . Codeine Rash  . Tramadol Rash    REVIEW OF SYSTEMS:  CONSTITUTIONAL: No fever, fatigue or weakness.  EYES: No blurred or double vision.  EARS, NOSE, AND THROAT: No tinnitus or ear pain.  RESPIRATORY: No cough, shortness of breath, wheezing or hemoptysis.  CARDIOVASCULAR: Reporting chest pain and palpitations exertional dyspnea  GASTROINTESTINAL: No nausea, vomiting, diarrhea or abdominal pain.  GENITOURINARY: No dysuria, hematuria.  ENDOCRINE: No polyuria, nocturia,  HEMATOLOGY: No anemia, easy bruising or bleeding SKIN: No rash or lesion. MUSCULOSKELETAL: No joint pain or arthritis.   NEUROLOGIC: No tingling, numbness, weakness.  PSYCHIATRY: No anxiety  or depression.   MEDICATIONS AT HOME:   Prior to Admission medications   Medication Sig Start Date End Date Taking? Authorizing Provider  budesonide-formoterol (SYMBICORT) 160-4.5 MCG/ACT inhaler Inhale 2 puffs into the lungs 2 (two) times daily. 02/26/16  Yes [provider]  bumetanide (BUMEX) 2 MG tablet Take 2 tablets (4 mg total) by mouth daily. Patient taking differently: Take 2 mg by mouth daily.  07/22/18  Yes Epifanio Lesches, MD  butalbital-acetaminophen-caffeine (FIORICET, ESGIC) [phone] MG tablet Take 1-2 tablets by mouth every 6 (six) hours as needed for headache.  10/11/18 10/11/19 Yes Veronese, [place], MD  gabapentin (NEURONTIN) 300 MG capsule Take 600 mg by mouth 3 (three) times daily.  07/22/15  Yes [provider]  ipratropium-albuterol (DUONEB) 0.5-2.5 (3) MG/3ML SOLN Inhale 3 mLs into the lungs 2 (two) times daily as needed (respiratory).  02/26/16 12/20/18 Yes [provider]  lactulose (CHRONULAC) 10 GM/15ML solution Take 45 mLs (30 g total) by mouth 2 (two) times daily as needed for mild constipation. 07/21/18  Yes Epifanio Lesches, MD  magnesium oxide (MAG-OX) 400 MG tablet Take 400 mg by mouth 2 (two) times daily. 07/11/18 07/11/19 Yes [provider]  metoprolol tartrate (LOPRESSOR) 50 MG tablet Take 75 mg by mouth 2 (two) times daily.    Yes [provider]  omeprazole (PRILOSEC) 40 MG capsule Take 40 mg by mouth 2 (two) times daily.   Yes [provider]  oxyCODONE-acetaminophen (PERCOCET/ROXICET) 5-325 MG tablet Take 1-2 tablets by mouth every 8 (eight) hours as needed for moderate pain or severe pain.   Yes [provider]  rivaroxaban (XARELTO) 20 MG TABS tablet Take 20 mg by mouth every evening.  07/11/18 07/11/19 Yes [provider]  tiZANidine (ZANAFLEX) 4 MG tablet Take 4 mg by mouth at bedtime.  08/10/18  Yes [provider]  topiramate (TOPAMAX) 25 MG tablet Take 25 mg by mouth 2 (two) times daily.  08/10/18  Yes [provider]  bisacodyl (DULCOLAX) 5 MG EC tablet Take 2 tablets (10 mg total) by mouth daily as needed for moderate constipation. 07/21/18   Epifanio Lesches, MD  diltiazem (CARDIZEM CD) 240 MG 24 hr capsule Take 1 capsule (240 mg total) by mouth daily. Patient not taking: Reported on 10/11/2018 04/26/18   Vaughan Basta, MD      VITAL SIGNS:  Blood pressure (!) 129/91, pulse 82, temperature (!) 97.3 F (36.3 C), temperature source Oral, resp. rate 15, height 5\' 4"  (1.626 m), weight 116 kg, last menstrual period 11/08/2009, SpO2 100  %.  PHYSICAL EXAMINATION:  GENERAL:  52 y.o.-year-old patient lying in the bed with no acute distress.  EYES: Pupils equal, round, reactive to light and accommodation. No scleral icterus. Extraocular muscles intact.  HEENT: Head atraumatic, normocephalic. Oropharynx and nasopharynx clear.  NECK:  Supple, no jugular venous distention. No thyroid enlargement, no tenderness.  LUNGS: Normal breath sounds bilaterally, no wheezing, rales,rhonchi or crepitation. No use of accessory muscles of respiration.  CARDIOVASCULAR: S1, S2 normal. No murmurs, rubs, or gallops.  Anterior chest wall is diffusely tender but more tenderness with palpation in the midline open heart surgery scar ABDOMEN: Soft, nontender, nondistended. Bowel sounds present.  EXTREMITIES: No pedal edema, cyanosis, or clubbing.  NEUROLOGIC: Cranial nerves II through XII are intact. Muscle strength 5/5 in all extremities. Sensation intact. Gait not checked.  PSYCHIATRIC: The patient is alert and oriented x 3.  SKIN: No obvious rash, lesion, or ulcer.   LABORATORY PANEL:  CBC Recent Labs  Lab 11/04/18 1024  WBC 9.4  HGB 11.9*  HCT 39.5  PLT 248   ------------------------------------------------------------------------------------------------------------------  Chemistries  Recent Labs  Lab 11/04/18 1024  NA 140  K 4.0  CL 103  CO2 29  GLUCOSE 105*  BUN 11  CREATININE 0.47  CALCIUM 9.0  AST 25  ALT 16  ALKPHOS 109  BILITOT 0.7   ------------------------------------------------------------------------------------------------------------------  Cardiac Enzymes Recent Labs  Lab 11/04/18 1024  TROPONINI <0.03   ------------------------------------------------------------------------------------------------------------------  RADIOLOGY:  Dg Chest Portable 1 View  Result Date: 11/04/2018 CLINICAL DATA:  Dizziness with fall this morning. LEFT-sided chest pain 2 days ago. Total knee replacement 2017. Cardiac  ablation surgery earlier this year. Additional history of asthma, COPD, CHF, coronary artery disease, hypertension, current smoker. EXAM: PORTABLE CHEST 1 VIEW COMPARISON:  Chest x-rays dated 10/11/2018 12/20/2017. FINDINGS: Median sternotomy wires appear intact and stable in alignment. Mitral valve replacement hardware and atrial clip appear grossly stable in position. Stable mild cardiomegaly. Mild peribronchial cuffing and subtle interstitial thickening again noted bilaterally suggesting chronic mild CHF. No evidence of overt alveolar pulmonary edema. No evidence of superimposed pneumonia. No pleural effusion or pneumothorax seen. No acute or suspicious osseous finding. IMPRESSION: 1. No active disease. No evidence of pneumonia or pulmonary edema. 2. Stable mild cardiomegaly. 3. Probable chronic mild CHF. Electronically Signed   By: Franki Cabot M.D.   On: 11/04/2018 11:19   Ct Angio Chest/abd/pel For Dissection W And/or Wo Contrast  Result Date: 11/04/2018 CLINICAL DATA:  52 year old female with a 2 day history of chest abdominal pain accompanied by presyncope. Clinical history includes atrial fibrillation and prior cardiac surgeries. EXAM: CT ANGIOGRAPHY CHEST, ABDOMEN AND PELVIS TECHNIQUE: Multidetector CT imaging through the chest, abdomen and pelvis was performed using the standard protocol during bolus administration of intravenous contrast. Multiplanar reconstructed images and MIPs were obtained and reviewed to evaluate the vascular anatomy. CONTRAST:  143mL ISOVUE-370 IOPAMIDOL (ISOVUE-370) INJECTION 76% COMPARISON:  Most recent prior CT scan of the chest 07/17/2018; MRI lumbar spine 01/07/2018; prior chest CT 12/08/2013 FINDINGS: CTA CHEST FINDINGS Cardiovascular: Initial unenhanced CT imaging demonstrates no evidence of acute intramural hematoma. Following the administration of IV contrast, the thoracic aorta and pulmonary artery are both well opacified. Conventional 3 vessel arch anatomy. The aortic  root, ascending, transverse and descending thoracic aorta are all normal in caliber. The main pulmonary artery is normal in caliber. No evidence of central filling defect to suggest acute PE. Surgical changes consistent with prior left atrial appendage ligation and mitral valvuloplasty. Mild cardiomegaly with biatrial enlargement. No pericardial effusion. Mediastinum/Nodes: Stable strandy soft tissue in the anterior mediastinum either representing the sequelae of prior surgical intervention, or residual thymic tissue. No significant interval change compared 07/17/2018. Unremarkable thoracic esophagus. No suspicious lymphadenopathy. Lungs/Pleura: Stable scattered bilateral calcified and noncalcified granulomas dating back to at least January of 2015. Additionally, there is a pattern diffuse reticulonodular airspace opacities predominantly in the bilateral upper lungs which also remains unchanged across several prior studies dating back to January of 2015. No definite acute airspace opacification, pulmonary edema, pleural effusion or pneumothorax. Stable mild diffuse bronchial wall thickening. Mild centrilobular pulmonary emphysema. Musculoskeletal: No acute fracture or aggressive appearing lytic or blastic osseous lesion. Review of the MIP images confirms the above findings. CTA ABDOMEN AND PELVIS FINDINGS VASCULAR Aorta: Normal caliber aorta without aneurysm, dissection, vasculitis or significant stenosis. Celiac: Patent without evidence of aneurysm, dissection, vasculitis or significant stenosis. SMA: Patent without evidence of aneurysm, dissection,  vasculitis or significant stenosis. Renals: Both renal arteries are patent without evidence of aneurysm, dissection, vasculitis, fibromuscular dysplasia or significant stenosis. IMA: Patent without evidence of aneurysm, dissection, vasculitis or significant stenosis. Inflow: Patent without evidence of aneurysm, dissection, vasculitis or significant stenosis. Veins: No  obvious venous abnormality within the limitations of this arterial phase study. Review of the MIP images confirms the above findings. NON-VASCULAR Hepatobiliary: Normal hepatic contour and morphology. Diffuse low attenuation of the hepatic parenchyma suggestive of underlying steatosis. No discrete hepatic lesions. Normal appearance of the gallbladder. No intra or extrahepatic biliary ductal dilatation. Pancreas: Unremarkable. No pancreatic ductal dilatation or surrounding inflammatory changes. Spleen: Normal in size without focal abnormality. Adrenals/Urinary Tract: Adrenal glands are unremarkable. Kidneys are normal, without renal calculi, focal lesion, or hydronephrosis. Bladder is unremarkable. Stomach/Bowel: Colonic diverticular disease without CT evidence of active inflammation. No evidence of obstruction or focal bowel wall thickening. Normal appendix in the right lower quadrant. The terminal ileum is unremarkable. Lymphatic: No suspicious lymphadenopathy. Reproductive: Uterus and bilateral adnexa are unremarkable. Other: No abdominal wall hernia or abnormality. No abdominopelvic ascites. Musculoskeletal: No acute fracture or aggressive appearing lytic or blastic osseous lesion. Review of the MIP images confirms the above findings. IMPRESSION: CTA CHEST 1. Negative for acute vascular abnormality, central pulmonary embolus, pneumonia or other acute cardiopulmonary process. 2. Stable chronic bilateral reticulonodular airspace opacities and underlying emphysema. Differential considerations include chronic hypersensitivity pneumonitis, sarcoidosis, and sequelae of old granulomatous disease superimposed on mild centrilobular emphysema. 3. Stable bilateral benign pulmonary granulomas. 4. Stable mild cardiomegaly with biatrial enlargement. 5. Surgical changes of prior mitral valve annuloplasty. CTA ABD/PELVIS 1. No acute vascular abnormality within the abdomen or pelvis. 2. Hepatic steatosis. 3. Colonic diverticular  disease without CT evidence of active inflammation. Electronically Signed   By: Jacqulynn Cadet M.D.   On: 11/04/2018 13:22   Dg Femur Min 2 Views Right  Result Date: 11/04/2018 CLINICAL DATA:  Fall this morning, dizziness. RIGHT upper leg pain. EXAM: RIGHT FEMUR 2 VIEWS COMPARISON:  None. FINDINGS: RIGHT knee arthroplasty hardware appears intact and appropriately position. Osseous alignment is normal. No fracture line or displaced fracture fragment seen. No appreciable joint effusion at the RIGHT knee joint. Soft tissues about the RIGHT femur are unremarkable. IMPRESSION: 1. Negative. 2. Hardware intact. Electronically Signed   By: Franki Cabot M.D.   On: 11/04/2018 11:16    EKG:   Orders placed or performed in visit on 11/04/18  . EKG 12-Lead    IMPRESSION AND PLAN:  Chelsea Ramsey  is a 52 y.o. female with a known history of rheumatic heart disease, CHF, COPD, coronary artery disease, hypertension with recent history of mitral valve replacement at [facility] 4 months ago, in July is presenting to the ED with a chief complaint of intermittent episodes of chest pain for the past 3 weeks  #Chest pain-atypical Monitor patient on telemetry Cycle cardiac biomarkers Consult placed to unassigned cardiology Dr. Saralyn Pilar, he is aware of the consult CTA chest negative for pulmonary embolism, CTA abdomen no acute findings   #Chronic diastolic CHF Daily weight monitoring, intake and output Continue home medication Bumex, beta-blocker metoprolol,   #Chronic atrial fibrillation rate controlled Continue home medication metoprolol and Xarelto  #Recent history of mitral valve replacement F/u with [facility] cardiology as recommended  #Allergic rhinitis continue Claritin  #COPD no exacerbation continue nebulizer treatments  #Chronic 5 pain syndrome with fibromyalgia  continue home medications Topamax and oxycodone as needed  #Tobacco abuse disorder Counseled patient  to quit smoking  completely for 5 minutes.  She verbalized understanding of the plan and agreeable with nicotine patch  DVT prophylaxis on Xarelto GI prophylaxis with PPI  All the records are reviewed and case discussed with ED provider. Management plans discussed with the patient, family and they are in agreement.  CODE STATUS: fc  TOTAL TIME TAKING CARE OF THIS PATIENT: 42 minutes.   Note: This dictation was prepared with Dragon dictation along with smaller phrase technology. Any transcriptional errors that result from this process are unintentional.  Nicholes Mango M.D on 11/04/2018 at 3:02 PM  Between 7am to 6pm - Pager - [phone]  After 6pm go to [email or web address] - password [email or web address] [facility]  [facility]  Office  [phone]  CC: Primary care physician; Kathee Delton, MD

## 2018-11-04 NOTE — Consult Note (Signed)
[facility] Cardiology  CARDIOLOGY CONSULT NOTE  Patient ID: Chelsea Ramsey MRN: [id number] DOB/AGE: Sep 09, 1966 52 y.o.  Admit date: 11/04/2018 Referring Physician [facility] Primary Physician [facility] Primary Cardiologist [facility] Reason for Consultation chest pain and palpitation  HPI: 41-year-old female referred for evaluation of chest pain and palpitations.  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  Patient presents with 2-day history of intermittent discomfort, primarily due to intermittent palpitations.  ECG revealed sinus rhythm.  Telemetry reveals sinus rhythm with premature atrial contractions.  Patient currently on metoprolol 75 mg twice daily.  Review of systems complete and found to be negative unless listed above     Past Medical History:  Diagnosis Date  . Allergy    seasonal  . Anxiety   . Arthritis    Right Knee  . Asthma   . CHF (congestive heart failure) ([place])   . COPD (chronic obstructive pulmonary disease) ([place])   . Coronary artery disease    Leaky heart valve  . Fibromyalgia   . GERD (gastroesophageal reflux disease)   . Hypertension   . Pneumonia   . PUD (peptic ulcer disease)   . Pulmonary HTN ([place])   . Rheumatic fever/heart disease   . Sleep apnea     Past Surgical History:  Procedure Laterality Date  . ADENOIDECTOMY    . ESOPHAGOGASTRODUODENOSCOPY (EGD) WITH PROPOFOL N/A 05/25/2018   Procedure: ESOPHAGOGASTRODUODENOSCOPY (EGD) WITH PROPOFOL;  Surgeon: Lucilla Lame, MD;  Location: [facility] ENDOSCOPY;  Service: Endoscopy;  Laterality: N/A;  . MITRAL VALVE REPLACEMENT    . MULTIPLE TOOTH EXTRACTIONS    . TONSILLECTOMY    . TOTAL KNEE ARTHROPLASTY Right 09/04/2016   Procedure: TOTAL KNEE ARTHROPLASTY; with lateral release;   Surgeon: Earlie Server, MD;  Location: [place];  Service: Orthopedics;  Laterality: Right;    Medications Prior to Admission  Medication Sig Dispense Refill Last Dose  . budesonide-formoterol (SYMBICORT) 160-4.5 MCG/ACT inhaler Inhale 2 puffs into the lungs 2 (two) times daily.   11/04/2018 at 0600  . bumetanide (BUMEX) 2 MG tablet Take 2 tablets (4 mg total) by mouth daily. (Patient taking differently: Take 2 mg by mouth daily. ) 30 tablet 0 11/04/2018 at 0600  . butalbital-acetaminophen-caffeine (FIORICET, ESGIC) 50-325-40 MG tablet Take 1-2 tablets by mouth every 6 (six) hours as needed for headache. 20 tablet 0  at PRN  . gabapentin (NEURONTIN) 300 MG capsule Take 600 mg by mouth 3 (three) times daily.    11/04/2018 at 0600  . ipratropium-albuterol (DUONEB) 0.5-2.5 (3) MG/3ML SOLN Inhale 3 mLs into the lungs 2 (two) times daily as needed (respiratory).    Past Month at PRN  . lactulose (CHRONULAC) 10 GM/15ML solution Take 45 mLs (30 g total) by mouth 2 (two) times daily as needed for mild constipation. 240 mL 0 Unknown at PRN  . magnesium oxide (MAG-OX) 400 MG tablet Take 400 mg by mouth 2 (two) times daily.   11/04/2018 at 0600  . metoprolol tartrate (LOPRESSOR) 50 MG tablet Take 75 mg by mouth 2 (two) times daily.    11/04/2018 at 0600  . omeprazole (PRILOSEC) 40 MG capsule Take 40 mg by mouth 2 (two) times daily.   11/04/2018 at 0600  . oxyCODONE-acetaminophen (PERCOCET/ROXICET) 5-325 MG tablet Take 1-2 tablets by mouth every 8 (eight) hours as needed for moderate pain or  severe pain.   Past Week at PRN  . rivaroxaban (XARELTO) 20 MG TABS tablet Take 20 mg by mouth every evening.    11/03/2018 at 1900  . tiZANidine (ZANAFLEX) 4 MG tablet Take 4 mg by mouth at bedtime.    11/03/2018 at 2000  . topiramate (TOPAMAX) 25 MG tablet Take 25 mg by mouth 2 (two) times daily.    11/04/2018 at 0600  . bisacodyl (DULCOLAX) 5 MG EC tablet Take 2 tablets (10 mg total) by mouth daily as needed for moderate  constipation. 30 tablet 0 Unknown at PRN  . diltiazem (CARDIZEM CD) 240 MG 24 hr capsule Take 1 capsule (240 mg total) by mouth daily. (Patient not taking: Reported on 10/11/2018) 30 capsule 0 Not Taking at Unknown time   Social History   Socioeconomic History  . Marital status: Married    Spouse name: Not on file  . Number of children: Not on file  . Years of education: Not on file  . Highest education level: Not on file  Occupational History  . Not on file  Social Needs  . Financial resource strain: Not on file  . Food insecurity:    Worry: Not on file    Inability: Not on file  . Transportation needs:    Medical: Not on file    Non-medical: Not on file  Tobacco Use  . Smoking status: Current Every Day Smoker    Packs/day: 0.50    Types: Cigarettes  . Smokeless tobacco: Never Used  Substance and Sexual Activity  . Alcohol use: Not Currently    Alcohol/week: 3.0 standard drinks    Types: 3 Cans of beer per week    Comment: 16 oz per week  . 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 Sexual activity: Not on file  Lifestyle  . Physical activity:    Days per week: Not on file    Minutes per session: Not on file  . Stress: Not on file  Relationships  . Social connections:    Talks on phone: Not on file    Gets together: Not on file    Attends religious service: Not on file    Active member of club or organization: Not on file    Attends meetings of clubs or organizations: Not on file    Relationship status: Not on file  . Intimate partner violence:    Fear of current or ex partner: Not on file    Emotionally abused: Not on file    Physically abused: Not on file    Forced sexual activity: Not on file  Other Topics Concern  . Not on file  Social History Narrative  . Not on file    Family History  Problem Relation Age of Onset  . COPD Mother   . Cancer Mother        Bone  . Asthma Mother   . Congestive Heart  Failure Father       Review of systems complete and found to be negative unless listed above      PHYSICAL EXAM  General: Well developed, well nourished, in no acute distress HEENT:  Normocephalic and atramatic Neck:  No JVD.  Lungs: Clear bilaterally to auscultation and percussion. Heart: HRRR . Normal S1 and S2 without gallops or murmurs.  Abdomen: Bowel sounds are positive, abdomen soft and non-tender  Msk:  Back normal, normal gait. Normal strength and  tone for age. Extremities: No clubbing, cyanosis or edema.   Neuro: Alert and oriented X 3. Psych:  Good affect, responds appropriately  Labs:   Lab Results  Component Value Date   WBC 9.4 11/04/2018   HGB 11.9 (L) 11/04/2018   HCT 39.5 11/04/2018   MCV 80.0 11/04/2018   PLT 248 11/04/2018    Recent Labs  Lab 11/04/18 1024  NA 140  K 4.0  CL 103  CO2 29  BUN 11  CREATININE 0.47  CALCIUM 9.0  PROT 7.1  BILITOT 0.7  ALKPHOS 109  ALT 16  AST 25  GLUCOSE 105*   Lab Results  Component Value Date   CKTOTAL 39 12/17/2013   CKMB 0.9 03/28/2013   TROPONINI <0.03 11/04/2018    Lab Results  Component Value Date   CHOL 96 04/03/2014   CHOL 91 12/09/2013   CHOL 145 06/02/2012   Lab Results  Component Value Date   HDL 32 (A) 04/03/2014   HDL 31 (L) 12/09/2013   HDL 66 (H) 06/02/2012   Lab Results  Component Value Date   LDLCALC 51 04/03/2014   LDLCALC 50 12/09/2013   LDLCALC 58 06/02/2012   Lab Results  Component Value Date   TRIG 63 04/03/2014   TRIG 50 12/09/2013   TRIG 106 06/02/2012   No results found for: CHOLHDL No results found for: LDLDIRECT    Radiology: Dg Chest 1 View  Result Date: 10/11/2018 CLINICAL DATA:  Chest pain and headache EXAM: CHEST  1 VIEW COMPARISON:  Chest radiograph and chest CT July 17, 2018 FINDINGS: There is fine interstitial prominence diffusely, likely chronic interstitial edema. No airspace consolidation. There is cardiomegaly with pulmonary vascularity normal.  Patient is status post mitral valve replacement. Left atrial appendage clamp present. No adenopathy. No bone lesions. IMPRESSION: Cardiomegaly with apparent chronic interstitial edema. No consolidation. Postoperative changes with evidence of previous mitral valve replacement and left atrial appendage clamp. No evident adenopathy. Electronically Signed   By: Lowella Grip III M.D.   On: 10/11/2018 11:12   Dg Si Joints  Result Date: 10/17/2018 CLINICAL DATA:  Chronic sacroiliac pain. EXAM: BILATERAL SACROILIAC JOINTS - 3+ VIEW COMPARISON:  Abdominal radiograph July 20, 2018 FINDINGS: The sacroiliac joint spaces are maintained, mild symmetric osteoarthrosis with undersurface spurring. No other bone abnormalities are seen. IMPRESSION: Mild bilateral sacroiliac osteoarthrosis. Electronically Signed   By: Elon Alas M.D.   On: 10/17/2018 14:33   Dg Knee 1-2 Views Right  Result Date: 10/17/2018 CLINICAL DATA:  Chronic right knee arthralgia. EXAM: RIGHT KNEE - 1-2 VIEW COMPARISON:  Right knee radiographs 07/05/2015. FINDINGS: Patient is status post right total knee arthroplasty. There is no significant joint effusion. Joint is located. Opponents are well seated. IMPRESSION: 1. Status post right total knee arthroplasty. 2. No acute abnormality. Electronically Signed   By: [name] Morelle M.D.   On: 10/17/2018 14:37   Ct Head Wo Contrast  Result Date: 10/11/2018 CLINICAL DATA:  Acute headache, this occurred during cardiac rehab this morning EXAM: CT HEAD WITHOUT CONTRAST TECHNIQUE: Contiguous axial images were obtained from the base of the skull through the vertex without intravenous contrast. COMPARISON:  CT brain scan of 04/24/2018 FINDINGS: Brain: The ventricular system is stable in size and configuration the septum remains midline in position. The fourth ventricle and basilar cisterns are unremarkable. No hemorrhage, mass lesion, acute infarction is seen. Vascular: No vascular abnormality  is noted on this unenhanced study. Skull: On bone window images, no calvarial  abnormality seen. Sinuses/Orbits: Paranasal sinuses that are visualized are well pneumatized. Other: None. IMPRESSION: Negative unenhanced CT brain. Electronically Signed   By: Ivar Drape M.D.   On: 10/11/2018 12:04   Mr Brain Wo Contrast  Result Date: 10/11/2018 CLINICAL DATA:  Vertigo, persistent central.  Headache and dizziness EXAM: MRI HEAD WITHOUT CONTRAST TECHNIQUE: Multiplanar, multiecho pulse sequences of the brain and surrounding structures were obtained without intravenous contrast. COMPARISON:  CT head 10/11/2018 FINDINGS: Brain: No acute infarction, hemorrhage, hydrocephalus, extra-axial collection or mass lesion. Image quality degraded by mild motion. Vascular: Normal arterial flow voids Skull and upper cervical spine: Negative Sinuses/Orbits: Negative Other: None IMPRESSION: Negative MRI head.  Image quality degraded by motion. Electronically Signed   By: Franchot Gallo M.D.   On: 10/11/2018 14:53   Dg Chest Portable 1 View  Result Date: 11/04/2018 CLINICAL DATA:  Dizziness with fall this morning. LEFT-sided chest pain 2 days ago. Total knee replacement 2017. Cardiac ablation surgery earlier this year. Additional history of asthma, COPD, CHF, coronary artery disease, hypertension, current smoker. EXAM: PORTABLE CHEST 1 VIEW COMPARISON:  Chest x-rays dated 10/11/2018 12/20/2017. FINDINGS: Median sternotomy wires appear intact and stable in alignment. Mitral valve replacement hardware and atrial clip appear grossly stable in position. Stable mild cardiomegaly. Mild peribronchial cuffing and subtle interstitial thickening again noted bilaterally suggesting chronic mild CHF. No evidence of overt alveolar pulmonary edema. No evidence of superimposed pneumonia. No pleural effusion or pneumothorax seen. No acute or suspicious osseous finding. IMPRESSION: 1. No active disease. No evidence of pneumonia or pulmonary edema.  2. Stable mild cardiomegaly. 3. Probable chronic mild CHF. Electronically Signed   By: Franki Cabot M.D.   On: 11/04/2018 11:19   Ct Angio Chest/abd/pel For Dissection W And/or Wo Contrast  Result Date: 11/04/2018 CLINICAL DATA:  52 year old female with a 2 day history of chest abdominal pain accompanied by presyncope. Clinical history includes atrial fibrillation and prior cardiac surgeries. EXAM: CT ANGIOGRAPHY CHEST, ABDOMEN AND PELVIS TECHNIQUE: Multidetector CT imaging through the chest, abdomen and pelvis was performed using the standard protocol during bolus administration of intravenous contrast. Multiplanar reconstructed images and MIPs were obtained and reviewed to evaluate the vascular anatomy. CONTRAST:  120mL ISOVUE-370 IOPAMIDOL (ISOVUE-370) INJECTION 76% COMPARISON:  Most recent prior CT scan of the chest 07/17/2018; MRI lumbar spine 01/07/2018; prior chest CT 12/08/2013 FINDINGS: CTA CHEST FINDINGS Cardiovascular: Initial unenhanced CT imaging demonstrates no evidence of acute intramural hematoma. Following the administration of IV contrast, the thoracic aorta and pulmonary artery are both well opacified. Conventional 3 vessel arch anatomy. The aortic root, ascending, transverse and descending thoracic aorta are all normal in caliber. The main pulmonary artery is normal in caliber. No evidence of central filling defect to suggest acute PE. Surgical changes consistent with prior left atrial appendage ligation and mitral valvuloplasty. Mild cardiomegaly with biatrial enlargement. No pericardial effusion. Mediastinum/Nodes: Stable strandy soft tissue in the anterior mediastinum either representing the sequelae of prior surgical intervention, or residual thymic tissue. No significant interval change compared 07/17/2018. Unremarkable thoracic esophagus. No suspicious lymphadenopathy. Lungs/Pleura: Stable scattered bilateral calcified and noncalcified granulomas dating back to at least January of 2015.  Additionally, there is a pattern diffuse reticulonodular airspace opacities predominantly in the bilateral upper lungs which also remains unchanged across several prior studies dating back to January of 2015. No definite acute airspace opacification, pulmonary edema, pleural effusion or pneumothorax. Stable mild diffuse bronchial wall thickening. Mild centrilobular pulmonary emphysema. Musculoskeletal: No acute fracture or aggressive appearing  lytic or blastic osseous lesion. Review of the MIP images confirms the above findings. CTA ABDOMEN AND PELVIS FINDINGS VASCULAR Aorta: Normal caliber aorta without aneurysm, dissection, vasculitis or significant stenosis. Celiac: Patent without evidence of aneurysm, dissection, vasculitis or significant stenosis. SMA: Patent without evidence of aneurysm, dissection, vasculitis or significant stenosis. Renals: Both renal arteries are patent without evidence of aneurysm, dissection, vasculitis, fibromuscular dysplasia or significant stenosis. IMA: Patent without evidence of aneurysm, dissection, vasculitis or significant stenosis. Inflow: Patent without evidence of aneurysm, dissection, vasculitis or significant stenosis. Veins: No obvious venous abnormality within the limitations of this arterial phase study. Review of the MIP images confirms the above findings. NON-VASCULAR Hepatobiliary: Normal hepatic contour and morphology. Diffuse low attenuation of the hepatic parenchyma suggestive of underlying steatosis. No discrete hepatic lesions. Normal appearance of the gallbladder. No intra or extrahepatic biliary ductal dilatation. Pancreas: Unremarkable. No pancreatic ductal dilatation or surrounding inflammatory changes. Spleen: Normal in size without focal abnormality. Adrenals/Urinary Tract: Adrenal glands are unremarkable. Kidneys are normal, without renal calculi, focal lesion, or hydronephrosis. Bladder is unremarkable. Stomach/Bowel: Colonic diverticular disease without CT  evidence of active inflammation. No evidence of obstruction or focal bowel wall thickening. Normal appendix in the right lower quadrant. The terminal ileum is unremarkable. Lymphatic: No suspicious lymphadenopathy. Reproductive: Uterus and bilateral adnexa are unremarkable. Other: No abdominal wall hernia or abnormality. No abdominopelvic ascites. Musculoskeletal: No acute fracture or aggressive appearing lytic or blastic osseous lesion. Review of the MIP images confirms the above findings. IMPRESSION: CTA CHEST 1. Negative for acute vascular abnormality, central pulmonary embolus, pneumonia or other acute cardiopulmonary process. 2. Stable chronic bilateral reticulonodular airspace opacities and underlying emphysema. Differential considerations include chronic hypersensitivity pneumonitis, sarcoidosis, and sequelae of old granulomatous disease superimposed on mild centrilobular emphysema. 3. Stable bilateral benign pulmonary granulomas. 4. Stable mild cardiomegaly with biatrial enlargement. 5. Surgical changes of prior mitral valve annuloplasty. CTA ABD/PELVIS 1. No acute vascular abnormality within the abdomen or pelvis. 2. Hepatic steatosis. 3. Colonic diverticular disease without CT evidence of active inflammation. Electronically Signed   By: Jacqulynn Cadet M.D.   On: 11/04/2018 13:22   Dg Femur Min 2 Views Right  Result Date: 11/04/2018 CLINICAL DATA:  Fall this morning, dizziness. RIGHT upper leg pain. EXAM: RIGHT FEMUR 2 VIEWS COMPARISON:  None. FINDINGS: RIGHT knee arthroplasty hardware appears intact and appropriately position. Osseous alignment is normal. No fracture line or displaced fracture fragment seen. No appreciable joint effusion at the RIGHT knee joint. Soft tissues about the RIGHT femur are unremarkable. IMPRESSION: 1. Negative. 2. Hardware intact. Electronically Signed   By: Franki Cabot M.D.   On: 11/04/2018 11:16    EKG: Normal sinus rhythm, with atrial contractions  ASSESSMENT  AND PLAN:   1.  Chest pain pain, atypical features, with recent cardiac catheterization demonstrated normal coronary anatomy, troponin negative 2.  Palpitations, probable symptomatic premature atrial contractions, no evidence for atrial fibrillation, currently on metoprolol  Recommendations  1.  Agree with current therapy 2.  Increase metoprolol tartrate 100 mg twice daily 3.  Continue Xarelto for stroke prevention 4.  Defer further cardiac diagnostics at this time  Signed: Isaias Cowman MD,PhD, [facility] 11/04/2018, 4:21 PM

## 2018-11-04 NOTE — ED Provider Notes (Signed)
[facility] Emergency Department Provider Note  ____________________________________________  Time seen: Approximately 2:17 PM  I have reviewed the triage vital signs and the nursing notes.   HISTORY  Chief Complaint Chest Pain    HPI Chelsea Ramsey is a 52 y.o. female with a history of hypertension, rheumatic heart disease, CHF, COPD, CAD who recently had a mitral valve replacement 4 months ago who complains of chest pain, intermittent for the past 3 weeks approximately.  Worse with exertion, better with rest.  Not positional.  Not accelerating , but today seems more persistent.  She had an episode of lightheadedness and syncope this morning causing her to fall onto her right side resulting in right upper leg pain.  Denies shortness of breath.  Not pleuritic.  Radiates from anterior chest to the mid abdomen.  No extremity paresthesia or weakness.      Past Medical History:  Diagnosis Date  . Allergy    seasonal  . Anxiety   . Arthritis    Right Knee  . Asthma   . CHF (congestive heart failure) ([place])   . COPD (chronic obstructive pulmonary disease) ([place])   . Coronary artery disease    Leaky heart valve  . Fibromyalgia   . GERD (gastroesophageal reflux disease)   . Hypertension   . Pneumonia   . PUD (peptic ulcer disease)   . Pulmonary HTN ([place])   . Rheumatic fever/heart disease   . Sleep apnea      Patient Active Problem List   Diagnosis Date Noted  . Chronic bilateral low back pain with left-sided sciatica (Primary Area of Pain) 10/10/2018  . Chronic pain of left lower extremity (Secondary Area of Pain) 10/10/2018  . Sternal pain (Tertiary Area of Pain) 10/10/2018  . Fibromyalgia (Fourth Area of Pain) 10/10/2018  . Chronic pain of right knee 10/10/2018  . Chronic sacroiliac joint pain 10/10/2018  . Chronic pain syndrome 10/10/2018  . Long term current use of opiate analgesic 10/10/2018  . Pharmacologic therapy 10/10/2018  . Disorder of  skeletal system 10/10/2018  . Problems influencing health status 10/10/2018  . Chronic diastolic heart failure ([place]) 07/26/2018  . HTN (hypertension) 07/26/2018  . Acute CHF (congestive heart failure) ([place]) 07/17/2018  . Melena   . Nausea and vomiting   . Acute esophagogastric ulcer   . Intractable vomiting with nausea   . GIB (gastrointestinal bleeding) 05/22/2018  . A-fib ([place]) 04/22/2018  . COPD (chronic obstructive pulmonary disease) ([place]) 12/21/2017  . COPD (chronic obstructive pulmonary disease) with acute bronchitis ([place]) 12/20/2017  . Primary localized osteoarthritis of right knee 09/04/2016     Past Surgical History:  Procedure Laterality Date  . ADENOIDECTOMY    . ESOPHAGOGASTRODUODENOSCOPY (EGD) WITH PROPOFOL N/A 05/25/2018   Procedure: ESOPHAGOGASTRODUODENOSCOPY (EGD) WITH PROPOFOL;  Surgeon: Lucilla Lame, MD;  Location: [facility] ENDOSCOPY;  Service: Endoscopy;  Laterality: N/A;  . MITRAL VALVE REPLACEMENT    . MULTIPLE TOOTH EXTRACTIONS    . TONSILLECTOMY    . TOTAL KNEE ARTHROPLASTY Right 09/04/2016   Procedure: TOTAL KNEE ARTHROPLASTY; with lateral release;  Surgeon: Earlie Server, MD;  Location: [place];  Service: Orthopedics;  Laterality: Right;     Prior to Admission medications   Medication Sig Start Date End Date Taking? Authorizing Provider  albuterol (PROVENTIL HFA;VENTOLIN HFA) 108 (90 Base) MCG/ACT inhaler Inhale into the lungs. 08/10/18   [provider]  bisacodyl (DULCOLAX) 5 MG EC tablet Take 2 tablets (10 mg total) by mouth daily as  needed for moderate constipation. 07/21/18   Epifanio Lesches, MD  budesonide-formoterol (SYMBICORT) 160-4.5 MCG/ACT inhaler Inhale 2 puffs into the lungs 2 (two) times daily. 02/26/16   [provider]  bumetanide (BUMEX) 2 MG tablet Take 2 tablets (4 mg total) by mouth daily. Patient taking differently: Take 2 mg by mouth daily.  07/22/18   Epifanio Lesches, MD  butalbital-acetaminophen-caffeine (FIORICET,  ESGIC) [phone] MG tablet Take 1-2 tablets by mouth every 6 (six) hours as needed for headache. 10/11/18 10/11/19  Rudene Re, MD  diltiazem (CARDIZEM CD) 240 MG 24 hr capsule Take 1 capsule (240 mg total) by mouth daily. Patient not taking: Reported on 10/11/2018 04/26/18   Vaughan Basta, MD  ferrous sulfate 325 (65 FE) MG tablet Take 325 mg by mouth daily. 06/01/18   [provider]  gabapentin (NEURONTIN) 300 MG capsule Take 600 mg by mouth 3 (three) times daily.  07/22/15   [provider]  HYDROcodone-acetaminophen (NORCO) 10-325 MG tablet Take 1 tablet by mouth daily as needed. 08/17/18   [provider]  ipratropium (ATROVENT) 0.02 % nebulizer solution Inhale into the lungs. 08/10/18   [provider]  ipratropium-albuterol (DUONEB) 0.5-2.5 (3) MG/3ML SOLN Inhale 3 mLs into the lungs every 8 (eight) hours as needed (respiratory).  02/26/16 12/20/18  [provider]  lactulose (CHRONULAC) 10 GM/15ML solution Take 45 mLs (30 g total) by mouth 2 (two) times daily as needed for mild constipation. 07/21/18   Epifanio Lesches, MD  magnesium oxide (MAG-OX) 400 MG tablet Take 400 mg by mouth 2 (two) times daily. 07/11/18 07/11/19  [provider]  metoprolol tartrate (LOPRESSOR) 50 MG tablet Take 150 mg by mouth daily.    [provider]  omeprazole (PRILOSEC) 40 MG capsule Take 40 mg by mouth 2 (two) times daily.    [provider]  rivaroxaban (XARELTO) 20 MG TABS tablet Take 20 mg by mouth daily. 07/11/18 07/11/19  [provider]  tiZANidine (ZANAFLEX) 4 MG tablet Take 4 mg by mouth at bedtime.  08/10/18   [provider]  topiramate (TOPAMAX) 25 MG tablet Take 25 mg by mouth 2 (two) times daily.  08/10/18   [provider]     Allergies Amiodarone; Aspirin; Flexeril [cyclobenzaprine]; Trazamine [trazodone & diet manage prod]; Codeine; and Tramadol   Family History  Problem Relation Age  of Onset  . COPD Mother   . Cancer Mother        Bone  . Asthma Mother   . Congestive Heart Failure Father     Social History Social History   Tobacco Use  . Smoking status: Current Every Day Smoker    Packs/day: 0.50    Types: Cigarettes  . Smokeless tobacco: Never Used  Substance Use Topics  . Alcohol use: Not Currently    Alcohol/week: 3.0 standard drinks    Types: 3 Cans of beer per week    Comment: 16 oz per week  . Drug use: Not Currently    Comment: Previous use of cocaine and marijuana last use 07/31/16     Review of Systems  Constitutional:   No fever or chills.  ENT:   No sore throat. No rhinorrhea. Cardiovascular: Positive as above chest pain without syncope. Respiratory:   No dyspnea or cough. Gastrointestinal:   Negative for abdominal pain, vomiting and diarrhea.  Musculoskeletal:   Right leg pain as above All other systems reviewed and are negative except as documented above in ROS and HPI.  ____________________________________________  PHYSICAL EXAM:  VITAL SIGNS: ED Triage Vitals  Enc Vitals Group     BP 11/04/18 1016 136/82     Pulse Rate 11/04/18 1014 87     Resp 11/04/18 1014 19     Temp 11/04/18 1017 (!) 97.3 F (36.3 C)     Temp Source 11/04/18 1017 Oral     SpO2 11/04/18 1012 100 %     Weight 11/04/18 1013 255 lb 11.7 oz (116 kg)     Height 11/04/18 1013 5\' 4"  (1.626 m)     Head Circumference --      Peak Flow --      Pain Score 11/04/18 1012 8     Pain Loc --      Pain Edu? --      Excl. in [place]? --     Vital signs reviewed, nursing assessments reviewed.   Constitutional:   Alert and oriented. Non-toxic appearance. Eyes:   Conjunctivae are normal. EOMI. PERRL. ENT      Head:   Normocephalic and atraumatic.      Nose:   No congestion/rhinnorhea.       Mouth/Throat:   MMM, no pharyngeal erythema. No peritonsillar mass.       Neck:   No meningismus. Full ROM. Hematological/Lymphatic/Immunilogical:   No cervical  lymphadenopathy. Cardiovascular:   RRR. Symmetric bilateral radial and DP pulses.  No murmurs. Cap refill less than 2 seconds. Respiratory:   Normal respiratory effort without tachypnea/retractions. Breath sounds are clear and equal bilaterally. No wheezes/rales/rhonchi. Gastrointestinal:   Soft with epigastric tenderness.  Non distended. There is no CVA tenderness.  No rebound, rigidity, or guarding.  No pulsatile mass or bruit  Musculoskeletal:   Normal range of motion in all extremities. No joint effusions.  No lower extremity tenderness.  No edema.  Sternotomy incision tender to the touch but noninflamed. Neurologic:   Normal speech and language.  Motor grossly intact. No acute focal neurologic deficits are appreciated.  Skin:    Skin is warm, dry and intact. No rash noted.  No petechiae, purpura, or bullae.  ____________________________________________    LABS (pertinent positives/negatives) (all labs ordered are listed, but only abnormal results are displayed) Labs Reviewed  COMPREHENSIVE METABOLIC PANEL - Abnormal; Notable for the following components:      Result Value   Glucose, Bld 105 (*)    All other components within normal limits  CBC WITH DIFFERENTIAL/PLATELET - Abnormal; Notable for the following components:   Hemoglobin 11.9 (*)    MCH 24.1 (*)    RDW 17.4 (*)    All other components within normal limits  PROTIME-INR - Abnormal; Notable for the following components:   Prothrombin Time 17.7 (*)    All other components within normal limits  LIPASE, BLOOD  TROPONIN I   ____________________________________________   EKG  Interpreted by me Sinus rhythm rate of 94, normal axis and intervals.  Normal QRS ST segments and T waves.  No acute ischemic changes  ____________________________________________    RADIOLOGY  Dg Chest Portable 1 View  Result Date: 11/04/2018 CLINICAL DATA:  Dizziness with fall this morning. LEFT-sided chest pain 2 days ago. Total knee  replacement 2017. Cardiac ablation surgery earlier this year. Additional history of asthma, COPD, CHF, coronary artery disease, hypertension, current smoker. EXAM: PORTABLE CHEST 1 VIEW COMPARISON:  Chest x-rays dated 10/11/2018 12/20/2017. FINDINGS: Median sternotomy wires appear intact and stable in alignment. Mitral valve replacement hardware and atrial clip appear grossly stable in position. Stable mild  cardiomegaly. Mild peribronchial cuffing and subtle interstitial thickening again noted bilaterally suggesting chronic mild CHF. No evidence of overt alveolar pulmonary edema. No evidence of superimposed pneumonia. No pleural effusion or pneumothorax seen. No acute or suspicious osseous finding. IMPRESSION: 1. No active disease. No evidence of pneumonia or pulmonary edema. 2. Stable mild cardiomegaly. 3. Probable chronic mild CHF. Electronically Signed   By: Franki Cabot M.D.   On: 11/04/2018 11:19   Ct Angio Chest/abd/pel For Dissection W And/or Wo Contrast  Result Date: 11/04/2018 CLINICAL DATA:  52 year old female with a 2 day history of chest abdominal pain accompanied by presyncope. Clinical history includes atrial fibrillation and prior cardiac surgeries. EXAM: CT ANGIOGRAPHY CHEST, ABDOMEN AND PELVIS TECHNIQUE: Multidetector CT imaging through the chest, abdomen and pelvis was performed using the standard protocol during bolus administration of intravenous contrast. Multiplanar reconstructed images and MIPs were obtained and reviewed to evaluate the vascular anatomy. CONTRAST:  125mL ISOVUE-370 IOPAMIDOL (ISOVUE-370) INJECTION 76% COMPARISON:  Most recent prior CT scan of the chest 07/17/2018; MRI lumbar spine 01/07/2018; prior chest CT 12/08/2013 FINDINGS: CTA CHEST FINDINGS Cardiovascular: Initial unenhanced CT imaging demonstrates no evidence of acute intramural hematoma. Following the administration of IV contrast, the thoracic aorta and pulmonary artery are both well opacified. Conventional 3  vessel arch anatomy. The aortic root, ascending, transverse and descending thoracic aorta are all normal in caliber. The main pulmonary artery is normal in caliber. No evidence of central filling defect to suggest acute PE. Surgical changes consistent with prior left atrial appendage ligation and mitral valvuloplasty. Mild cardiomegaly with biatrial enlargement. No pericardial effusion. Mediastinum/Nodes: Stable strandy soft tissue in the anterior mediastinum either representing the sequelae of prior surgical intervention, or residual thymic tissue. No significant interval change compared 07/17/2018. Unremarkable thoracic esophagus. No suspicious lymphadenopathy. Lungs/Pleura: Stable scattered bilateral calcified and noncalcified granulomas dating back to at least January of 2015. Additionally, there is a pattern diffuse reticulonodular airspace opacities predominantly in the bilateral upper lungs which also remains unchanged across several prior studies dating back to January of 2015. No definite acute airspace opacification, pulmonary edema, pleural effusion or pneumothorax. Stable mild diffuse bronchial wall thickening. Mild centrilobular pulmonary emphysema. Musculoskeletal: No acute fracture or aggressive appearing lytic or blastic osseous lesion. Review of the MIP images confirms the above findings. CTA ABDOMEN AND PELVIS FINDINGS VASCULAR Aorta: Normal caliber aorta without aneurysm, dissection, vasculitis or significant stenosis. Celiac: Patent without evidence of aneurysm, dissection, vasculitis or significant stenosis. SMA: Patent without evidence of aneurysm, dissection, vasculitis or significant stenosis. Renals: Both renal arteries are patent without evidence of aneurysm, dissection, vasculitis, fibromuscular dysplasia or significant stenosis. IMA: Patent without evidence of aneurysm, dissection, vasculitis or significant stenosis. Inflow: Patent without evidence of aneurysm, dissection, vasculitis or  significant stenosis. Veins: No obvious venous abnormality within the limitations of this arterial phase study. Review of the MIP images confirms the above findings. NON-VASCULAR Hepatobiliary: Normal hepatic contour and morphology. Diffuse low attenuation of the hepatic parenchyma suggestive of underlying steatosis. No discrete hepatic lesions. Normal appearance of the gallbladder. No intra or extrahepatic biliary ductal dilatation. Pancreas: Unremarkable. No pancreatic ductal dilatation or surrounding inflammatory changes. Spleen: Normal in size without focal abnormality. Adrenals/Urinary Tract: Adrenal glands are unremarkable. Kidneys are normal, without renal calculi, focal lesion, or hydronephrosis. Bladder is unremarkable. Stomach/Bowel: Colonic diverticular disease without CT evidence of active inflammation. No evidence of obstruction or focal bowel wall thickening. Normal appendix in the right lower quadrant. The terminal ileum is unremarkable. Lymphatic: No suspicious  lymphadenopathy. Reproductive: Uterus and bilateral adnexa are unremarkable. Other: No abdominal wall hernia or abnormality. No abdominopelvic ascites. Musculoskeletal: No acute fracture or aggressive appearing lytic or blastic osseous lesion. Review of the MIP images confirms the above findings. IMPRESSION: CTA CHEST 1. Negative for acute vascular abnormality, central pulmonary embolus, pneumonia or other acute cardiopulmonary process. 2. Stable chronic bilateral reticulonodular airspace opacities and underlying emphysema. Differential considerations include chronic hypersensitivity pneumonitis, sarcoidosis, and sequelae of old granulomatous disease superimposed on mild centrilobular emphysema. 3. Stable bilateral benign pulmonary granulomas. 4. Stable mild cardiomegaly with biatrial enlargement. 5. Surgical changes of prior mitral valve annuloplasty. CTA ABD/PELVIS 1. No acute vascular abnormality within the abdomen or pelvis. 2. Hepatic  steatosis. 3. Colonic diverticular disease without CT evidence of active inflammation. Electronically Signed   By: Jacqulynn Cadet M.D.   On: 11/04/2018 13:22   Dg Femur Min 2 Views Right  Result Date: 11/04/2018 CLINICAL DATA:  Fall this morning, dizziness. RIGHT upper leg pain. EXAM: RIGHT FEMUR 2 VIEWS COMPARISON:  None. FINDINGS: RIGHT knee arthroplasty hardware appears intact and appropriately position. Osseous alignment is normal. No fracture line or displaced fracture fragment seen. No appreciable joint effusion at the RIGHT knee joint. Soft tissues about the RIGHT femur are unremarkable. IMPRESSION: 1. Negative. 2. Hardware intact. Electronically Signed   By: Franki Cabot M.D.   On: 11/04/2018 11:16    ____________________________________________   PROCEDURES Angiocath insertion Date/Time: 11/04/2018 2:34 PM Performed by: Carrie Mew, MD Authorized by: Carrie Mew, MD  Consent: Verbal consent obtained. Consent given by: patient Comments: Continuous ultrasound visualization, 20-gauge IV placed into right cephalic vein. 1 attempt, no complications.  EBL 0     ____________________________________________  DIFFERENTIAL DIAGNOSIS   Non-STEMI, aortic dissection, pleurisy  CLINICAL IMPRESSION / ASSESSMENT AND PLAN / ED COURSE  Pertinent labs & imaging results that were available during my care of the patient were reviewed by me and considered in my medical decision making (see chart for details).      Clinical Course as of Nov 04 1428  Fri Nov 04, 2018  1027 Patient presents with reports of exertional chest pain.  This is been going on for the past couple weeks, but she left without being seen went to her primary care doctor recently sent her to the emergency room for evaluation of the symptoms.  Seems to be worse now for the past 2 days.  Patient currently refusing nitroglycerin that was ordered on initial assessment.  We will continue to encourage her to try  nitroglycerin.  Concern for possible dissection given her widespread radiating pain.  Will hold off on anticoagulants or antiplatelet agents until this can be addressed.   [PS]  3785 Chest x-ray image viewed by me, clear lungs, no pneumothorax or pneumonia or pulmonary edema.  Proceed with CT angiogram of the chest abdomen pelvis to evaluate for dissection..  With normal vital signs and wide-ranging symptoms, I doubt PE.   [PS]  1159 [place] PIV placed by me.    [PS]    Clinical Course User Index [PS] Carrie Mew, MD     ----------------------------------------- 2:30 PM on 11/04/2018 -----------------------------------------  CT angiogram negative.  No evidence of dissection.  I doubt PE.  Presentation is not consistent with unstable angina.  Given her persistent chest pain and comorbidities, plan to hospitalize for further cardiac work-up, telemetry monitoring.  ____________________________________________   FINAL CLINICAL IMPRESSION(S) / ED DIAGNOSES    Final diagnoses:  Chest pain with moderate risk for cardiac  etiology     ED Discharge Orders    None      Portions of this note were generated with dragon dictation software. Dictation errors may occur despite best attempts at proofreading.    Carrie Mew, MD 11/04/18 1434

## 2018-11-04 NOTE — ED Triage Notes (Signed)
Pt to ED via EMS from home. Pt c/o chest pain and abd pain x2days. Pt states this am she became very dizzy and then realized she was on the floor had possible syncopal episode. Pt states shes been having n/v x few days worse today. Pt has hx of a fib and cardiac surgeries. Pt states she feels weak and has a fluttering feeling in her heart at times.

## 2018-11-04 NOTE — ED Notes (Signed)
Patient transported to X-ray 

## 2018-11-04 NOTE — ED Notes (Signed)
Pt c/o still nauseated and still in pain. Offered a cool rag to ease nausea and pt declined. Pt's RN made aware

## 2018-11-04 NOTE — ED Notes (Signed)
Attempted IV access x 2, 20 G above the wrist due to Dr. Joni Fears planning to order CT angio. Both attempts unsuccessful, Larene Beach RN will look with ultrasound.

## 2018-11-04 NOTE — ED Notes (Signed)
Patient transported to CT 

## 2018-11-05 ENCOUNTER — Observation Stay
Admit: 2018-11-05 | Discharge: 2018-11-05 | Disposition: A | Discharge status: Home / Self Care | Payer: Medicaid Other | Attending: Internal Medicine | Admitting: Internal Medicine

## 2018-11-05 LAB — TROPONIN I: Troponin I: <0.03 ng/mL (ref <0.03)

## 2018-11-05 LAB — GLUCOSE, CAPILLARY: Glucose-Capillary: 133 mg/dL — ABNORMAL HIGH (ref 70–99)

## 2018-11-05 MED ORDER — NITROGLYCERIN 0.4 MG SL SUBL: 0.4000 mg | SUBLINGUAL_TABLET | SUBLINGUAL | 12 refills | Status: DC | PRN

## 2018-11-05 MED ORDER — NICOTINE 7 MG/24HR TD PT24: 7.0000 mg | MEDICATED_PATCH | Freq: Every day | TRANSDERMAL | 0 refills | Status: DC

## 2018-11-05 MED ORDER — PERFLUTREN LIPID MICROSPHERE
1.0000 mL | INTRAVENOUS | Status: AC | PRN
  Administered 2018-11-05: 3 mL via INTRAVENOUS
  Filled 2018-11-05: qty 10

## 2018-11-05 MED ORDER — PROMETHAZINE HCL 25 MG/ML IJ SOLN
25.0000 mg | Freq: Four times a day (QID) | INTRAMUSCULAR | Status: DC | PRN
  Administered 2018-11-05 – 2018-11-06 (×2): 25 mg via INTRAVENOUS
  Filled 2018-11-05 (×2): qty 1

## 2018-11-05 MED ORDER — METOPROLOL TARTRATE 100 MG PO TABS: 100.0000 mg | ORAL_TABLET | Freq: Two times a day (BID) | ORAL | 0 refills | Status: DC

## 2018-11-05 NOTE — Progress Notes (Signed)
Physical Therapy Evaluation Patient Details Name: Chelsea Ramsey MRN: [id number] DOB: 28-Oct-1966 Today's Date: 11/05/2018   History of Present Illness  Per MD note:Chelsea Ramsey  is a 52 y.o. female with a known history of rheumatic heart disease, CHF, COPD, coronary artery disease, hypertension with recent history of mitral valve replacement at [facility] 4 months ago, in July is presenting to the ED with a chief complaint of intermittent episodes of chest pain for the past 3 weeks.  Reports that chest pain is getting worse with exertion.  Clinical Impression  Patient presents with reports of dizziness,LE  weakness and decreased mobility.Patient has 3/5 strength BLE. She is MI for transfers with RW and is not able to stand without UE support and has posterior lean during standing without UE support. She is unable to ambulate due to dizziness. She has a hover round and RW at home.She will benefit from skilled PT to improve strength and mobility and is recommended [facility] PT for discharge.     Follow Up Recommendations      Equipment Recommendations       Recommendations for Other Services       Precautions / Restrictions Precautions Precautions: Fall Restrictions Weight Bearing Restrictions: No      Mobility  Bed Mobility Overal bed mobility: Independent                Transfers Overall transfer level: Modified independent Equipment used: Rolling walker (2 wheeled)             General transfer comment: using O2, patient  O2 saturation 100 percent with heavy breathing, unable to  stand independently without UE support of RW, patient reporting dizziness. with posterior  sway  Ambulation/Gait Ambulation/Gait assistance: (Patient has dizziness and unable to attempt ambulation.)              Stairs            Wheelchair Mobility    Modified Rankin (Stroke Patients Only)       Balance Overall balance assessment: Needs assistance   Sitting  balance-Leahy Scale: Good     Standing balance support: Bilateral upper extremity supported Standing balance-Leahy Scale: Fair                               Pertinent Vitals/Pain Pain Assessment: 0-10 Pain Score: 7  Pain Location: (chest) Pain Descriptors / Indicators: Discomfort Pain Intervention(s): Monitored during session    Home Living Family/patient expects to be discharged to:: Private residence Living Arrangements: Spouse/significant other Available Help at Discharge: Family Type of Home: House Home Access: Stairs to enter   Technical brewer of Steps: 2 Home Layout: One level        Prior Function Level of Independence: Independent with assistive device(s)               Hand Dominance        Extremity/Trunk Assessment   Upper Extremity Assessment Upper Extremity Assessment: Overall WFL for tasks assessed    Lower Extremity Assessment Lower Extremity Assessment: Generalized weakness       Communication   Communication: No difficulties  Cognition Arousal/Alertness: Awake/alert Behavior During Therapy: Restless;Anxious Overall Cognitive Status: Within Functional Limits for tasks assessed  General Comments General comments (skin integrity, edema, etc.): [facility])    Exercises     Assessment/Plan    PT Assessment Patient needs continued PT services  PT Problem List Decreased strength;Decreased activity tolerance;Decreased balance;Decreased mobility;Obesity;Pain       PT Treatment Interventions Gait training;Stair training;Therapeutic exercise;Balance training    PT Goals (Current goals can be found in the Care Plan section)  Acute Rehab PT Goals Patient Stated Goal: to get stronger PT Goal Formulation: With patient Time For Goal Achievement: 11/19/18 Potential to Achieve Goals: Fair    Frequency Min 2X/week   Barriers to discharge   2 steps into house     Co-evaluation               AM-PAC PT "6 Clicks" Mobility  Outcome Measure Help needed turning from your back to your side while in a flat bed without using bedrails?: None Help needed moving from lying on your back to sitting on the side of a flat bed without using bedrails?: None Help needed moving to and from a bed to a chair (including a wheelchair)?: A Little Help needed standing up from a chair using your arms (e.g., wheelchair or bedside chair)?: A Little Help needed to walk in hospital room?: Total Help needed climbing 3-5 steps with a railing? : Total 6 Click Score: 16    End of Session Equipment Utilized During Treatment: Gait belt;Oxygen;Other (comment) Activity Tolerance: Patient limited by fatigue(poor)     PT Visit Diagnosis: Unsteadiness on feet (R26.81);Other abnormalities of gait and mobility (R26.89);Muscle weakness (generalized) (M62.81);History of falling (Z91.81);Difficulty in walking, not elsewhere classified (R26.2);Dizziness and giddiness (R42);Pain    Time: 1537-9432 PT Time Calculation (min) (ACUTE ONLY): 30 min   Charges:   PT Evaluation $PT Eval Low Complexity: 1 Low            [name], Kristine SPT DPT 11/05/2018, 1:14 PM

## 2018-11-05 NOTE — Progress Notes (Addendum)
This RN spoke to patient regarding her discharge plan for today.  Orders written by MD and patient wroked with PT as per orders.  Patient states she does not feel safe going home today because she still feels dizzy and weak.  Patient states that neither her spouse or her mother (lives next door) will be home until around midnight and she does not want to be home alone. Dr. Benjie Karvonen called and made aware.  Per MD, OK to discharge patient in early am.  Patient agreeable to this plan and states that her husband can pick her up early in the morning.  Patient offered her flu vaccine at this time but refused to have the injection. Per Dr. Benjie Karvonen, telemetry removed at this time as well as patient is on discharge. 1630-patient changed her mind and requested the flu vaccine.  Vaccine given to patient in L deltoid.  No complaints at this time.

## 2018-11-05 NOTE — Discharge Summary (Addendum)
[place] [facility] [place] NAME: Chelsea Ramsey    MR#:  [id number]  DATE OF BIRTH:  1966/03/27  DATE OF ADMISSION:  11/04/2018 ADMITTING PHYSICIAN: Nicholes Mango, MD  DATE OF DISCHARGE: 11/05/2018  PRIMARY CARE PHYSICIAN: Kathee Delton, MD    ADMISSION DIAGNOSIS:  Chest pain with moderate risk for cardiac etiology [R07.9] Syncope, unspecified syncope type [R55]  DISCHARGE DIAGNOSIS:  Active Problems:   Chest pain   SECONDARY DIAGNOSIS:   Past Medical History:  Diagnosis Date  . Allergy    seasonal  . Anxiety   . Arthritis    Right Knee  . Asthma   . CHF (congestive heart failure) ([place])   . COPD (chronic obstructive pulmonary disease) ([place])   . Coronary artery disease    Leaky heart valve  . Fibromyalgia   . GERD (gastroesophageal reflux disease)   . Hypertension   . Pneumonia   . PUD (peptic ulcer disease)   . Pulmonary HTN ([place])   . Rheumatic fever/heart disease   . 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  1.  Atypical chest pain: Patient has ruled out for ACS with negative troponins.  She has been evaluated by cardiology.  No further cardiac recommendations.  2.  Palpitations: Recommendations are to increase Toprol to 100 mg p.o. twice daily  3.  Postoperative atrial fibrillation: Continue metoprolol and Xarelto  4.  Dizziness: Patient reports feeling dizzy however she is speaking full sentences and has no neurological deficits. Physical therapy consultation was recommended 5.  Chronic diastolic heart failure without signs of exacerbation  6.  History of mitral valve replacement: Patient is followed at [facility] cardiology  7. Tobacco dependence: Patient is encouraged to quit smoking. Counseling was provided for 4 minutes.   DISCHARGE CONDITIONS AND DIET:    Stable for discharge  on heart healthy diet CONSULTS OBTAINED:  Treatment Team:  Isaias Cowman, MD  DRUG ALLERGIES:   Allergies  Allergen Reactions  . Amiodarone Nausea And Vomiting  . Aspirin Swelling  . Flexeril [Cyclobenzaprine] Swelling  . Trazamine [Trazodone & Diet Manage Prod] Nausea And Vomiting  . Codeine Rash  . Tramadol Rash    DISCHARGE MEDICATIONS:   Allergies as of 11/06/2018      Reactions   Amiodarone Nausea And Vomiting   Aspirin Swelling   Flexeril [cyclobenzaprine] Swelling   Trazamine [trazodone & Diet Manage Prod] Nausea And Vomiting   Codeine Rash   Tramadol Rash      Medication List    STOP taking these medications   diltiazem 240 MG 24 hr capsule Commonly known as:  CARDIZEM CD     TAKE these medications   bisacodyl 5 MG EC tablet Commonly known as:  DULCOLAX Take 2 tablets (10 mg total) by mouth daily as needed for moderate constipation.   bumetanide 2 MG tablet Commonly known as:  BUMEX Take 2 tablets (4 mg total) by mouth daily. What changed:  how much to take   butalbital-acetaminophen-caffeine 50-325-40 MG tablet Commonly known as:  FIORICET, ESGIC Take 1-2 tablets by mouth every 6 (six) hours as needed for headache.   gabapentin 300 MG capsule Commonly known as:  NEURONTIN Take 600 mg by mouth 3 (three) times daily.   ipratropium-albuterol 0.5-2.5 (3) MG/3ML Soln Commonly known as:  DUONEB Inhale 3 mLs into the lungs 2 (two)  times daily as needed (respiratory).   lactulose 10 GM/15ML solution Commonly known as:  CHRONULAC Take 45 mLs (30 g total) by mouth 2 (two) times daily as needed for mild constipation.   magnesium oxide 400 MG tablet Commonly known as:  MAG-OX Take 400 mg by mouth 2 (two) times daily.   metoprolol tartrate 100 MG tablet Commonly known as:  LOPRESSOR Take 1 tablet (100 mg total) by mouth 2 (two) times daily. What changed:    medication strength  how much to take   nicotine 7 mg/24hr patch Commonly known as:   NICODERM CQ - dosed in mg/24 hr Place 1 patch (7 mg total) onto the skin daily.   nitroGLYCERIN 0.4 MG SL tablet Commonly known as:  NITROSTAT Place 1 tablet (0.4 mg total) under the tongue every 5 (five) minutes as needed for chest pain.   omeprazole 40 MG capsule Commonly known as:  PRILOSEC Take 40 mg by mouth 2 (two) times daily.   oxyCODONE-acetaminophen 5-325 MG tablet Commonly known as:  PERCOCET/ROXICET Take 1-2 tablets by mouth every 8 (eight) hours as needed for moderate pain or severe pain.   promethazine 12.5 MG tablet Commonly known as:  PHENERGAN Take 1 tablet (12.5 mg total) by mouth every 6 (six) hours as needed for nausea or vomiting.   rivaroxaban 20 MG Tabs tablet Commonly known as:  XARELTO Take 20 mg by mouth every evening.   SYMBICORT 160-4.5 MCG/ACT inhaler Generic drug:  budesonide-formoterol Inhale 2 puffs into the lungs 2 (two) times daily.   tiZANidine 4 MG tablet Commonly known as:  ZANAFLEX Take 4 mg by mouth at bedtime.   topiramate 25 MG tablet Commonly known as:  TOPAMAX Take 25 mg by mouth 2 (two) times daily.         Today   CHIEF COMPLAINT:   No chest pain overnight   VITAL SIGNS:  Blood pressure 120/86, pulse 87, temperature 97.6 F (36.4 C), resp. rate 18, height 5\' 4"  (1.626 m), weight 105.1 kg, last menstrual period 11/08/2009, SpO2 99 %.   REVIEW OF SYSTEMS:  Review of Systems  Constitutional: Negative.  Negative for chills, fever and malaise/fatigue.  HENT: Negative.  Negative for ear discharge, ear pain, hearing loss, nosebleeds and sore throat.   Eyes: Negative.  Negative for blurred vision and pain.  Respiratory: Negative.  Negative for cough, hemoptysis, shortness of breath and wheezing.   Cardiovascular: Negative.  Negative for chest pain, palpitations and leg swelling.  Gastrointestinal: Negative.  Negative for abdominal pain, blood in stool, diarrhea, nausea and vomiting.  Genitourinary: Negative.  Negative  for dysuria.  Musculoskeletal: Negative.  Negative for back pain.  Skin: Negative.   Neurological: Negative for dizziness, tremors, speech change, focal weakness, seizures and headaches.  Endo/Heme/Allergies: Negative.  Does not bruise/bleed easily.  Psychiatric/Behavioral: Negative.  Negative for depression, hallucinations and suicidal ideas.     PHYSICAL EXAMINATION:  GENERAL:  52 y.o.-year-old patient lying in the bed with no acute distress.  NECK:  Supple, no jugular venous distention. No thyroid enlargement, no tenderness.  LUNGS: Normal breath sounds bilaterally, no wheezing, rales,rhonchi  No use of accessory muscles of respiration.  CARDIOVASCULAR: S1, S2 normal. No murmurs, rubs, or gallops.  ABDOMEN: Soft, non-tender, non-distended. Bowel sounds present. No organomegaly or mass.  EXTREMITIES: No pedal edema, cyanosis, or clubbing.  PSYCHIATRIC: The patient is alert and oriented x 3.  SKIN: No obvious rash, lesion, or ulcer.   DATA REVIEW:   CBC Recent Labs  Lab 11/04/18 1024  WBC 9.4  HGB 11.9*  HCT 39.5  PLT 248    Chemistries  Recent Labs  Lab 11/04/18 1024  NA 140  K 4.0  CL 103  CO2 29  GLUCOSE 105*  BUN 11  CREATININE 0.47  CALCIUM 9.0  AST 25  ALT 16  ALKPHOS 109  BILITOT 0.7    Cardiac Enzymes Recent Labs  Lab 11/04/18 1855 11/04/18 2127 11/05/18 0652  TROPONINI <0.03 <0.03 <0.03    Microbiology Results  @[email or web address] @  RADIOLOGY:  Dg Chest Portable 1 View  Result Date: 11/04/2018 CLINICAL DATA:  Dizziness with fall this morning. LEFT-sided chest pain 2 days ago. Total knee replacement 2017. Cardiac ablation surgery earlier this year. Additional history of asthma, COPD, CHF, coronary artery disease, hypertension, current smoker. EXAM: PORTABLE CHEST 1 VIEW COMPARISON:  Chest x-rays dated 10/11/2018 12/20/2017. FINDINGS: Median sternotomy wires appear intact and stable in alignment. Mitral valve replacement hardware and atrial clip appear  grossly stable in position. Stable mild cardiomegaly. Mild peribronchial cuffing and subtle interstitial thickening again noted bilaterally suggesting chronic mild CHF. No evidence of overt alveolar pulmonary edema. No evidence of superimposed pneumonia. No pleural effusion or pneumothorax seen. No acute or suspicious osseous finding. IMPRESSION: 1. No active disease. No evidence of pneumonia or pulmonary edema. 2. Stable mild cardiomegaly. 3. Probable chronic mild CHF. Electronically Signed   By: Franki Cabot M.D.   On: 11/04/2018 11:19   Ct Angio Chest/abd/pel For Dissection W And/or Wo Contrast  Result Date: 11/04/2018 CLINICAL DATA:  52 year old female with a 2 day history of chest abdominal pain accompanied by presyncope. Clinical history includes atrial fibrillation and prior cardiac surgeries. EXAM: CT ANGIOGRAPHY CHEST, ABDOMEN AND PELVIS TECHNIQUE: Multidetector CT imaging through the chest, abdomen and pelvis was performed using the standard protocol during bolus administration of intravenous contrast. Multiplanar reconstructed images and MIPs were obtained and reviewed to evaluate the vascular anatomy. CONTRAST:  168mL ISOVUE-370 IOPAMIDOL (ISOVUE-370) INJECTION 76% COMPARISON:  Most recent prior CT scan of the chest 07/17/2018; MRI lumbar spine 01/07/2018; prior chest CT 12/08/2013 FINDINGS: CTA CHEST FINDINGS Cardiovascular: Initial unenhanced CT imaging demonstrates no evidence of acute intramural hematoma. Following the administration of IV contrast, the thoracic aorta and pulmonary artery are both well opacified. Conventional 3 vessel arch anatomy. The aortic root, ascending, transverse and descending thoracic aorta are all normal in caliber. The main pulmonary artery is normal in caliber. No evidence of central filling defect to suggest acute PE. Surgical changes consistent with prior left atrial appendage ligation and mitral valvuloplasty. Mild cardiomegaly with biatrial enlargement. No  pericardial effusion. Mediastinum/Nodes: Stable strandy soft tissue in the anterior mediastinum either representing the sequelae of prior surgical intervention, or residual thymic tissue. No significant interval change compared 07/17/2018. Unremarkable thoracic esophagus. No suspicious lymphadenopathy. Lungs/Pleura: Stable scattered bilateral calcified and noncalcified granulomas dating back to at least January of 2015. Additionally, there is a pattern diffuse reticulonodular airspace opacities predominantly in the bilateral upper lungs which also remains unchanged across several prior studies dating back to January of 2015. No definite acute airspace opacification, pulmonary edema, pleural effusion or pneumothorax. Stable mild diffuse bronchial wall thickening. Mild centrilobular pulmonary emphysema. Musculoskeletal: No acute fracture or aggressive appearing lytic or blastic osseous lesion. Review of the MIP images confirms the above findings. CTA ABDOMEN AND PELVIS FINDINGS VASCULAR Aorta: Normal caliber aorta without aneurysm, dissection, vasculitis or significant stenosis. Celiac: Patent without evidence of aneurysm, dissection, vasculitis or significant stenosis. SMA: Patent without  evidence of aneurysm, dissection, vasculitis or significant stenosis. Renals: Both renal arteries are patent without evidence of aneurysm, dissection, vasculitis, fibromuscular dysplasia or significant stenosis. IMA: Patent without evidence of aneurysm, dissection, vasculitis or significant stenosis. Inflow: Patent without evidence of aneurysm, dissection, vasculitis or significant stenosis. Veins: No obvious venous abnormality within the limitations of this arterial phase study. Review of the MIP images confirms the above findings. NON-VASCULAR Hepatobiliary: Normal hepatic contour and morphology. Diffuse low attenuation of the hepatic parenchyma suggestive of underlying steatosis. No discrete hepatic lesions. Normal appearance of  the gallbladder. No intra or extrahepatic biliary ductal dilatation. Pancreas: Unremarkable. No pancreatic ductal dilatation or surrounding inflammatory changes. Spleen: Normal in size without focal abnormality. Adrenals/Urinary Tract: Adrenal glands are unremarkable. Kidneys are normal, without renal calculi, focal lesion, or hydronephrosis. Bladder is unremarkable. Stomach/Bowel: Colonic diverticular disease without CT evidence of active inflammation. No evidence of obstruction or focal bowel wall thickening. Normal appendix in the right lower quadrant. The terminal ileum is unremarkable. Lymphatic: No suspicious lymphadenopathy. Reproductive: Uterus and bilateral adnexa are unremarkable. Other: No abdominal wall hernia or abnormality. No abdominopelvic ascites. Musculoskeletal: No acute fracture or aggressive appearing lytic or blastic osseous lesion. Review of the MIP images confirms the above findings. IMPRESSION: CTA CHEST 1. Negative for acute vascular abnormality, central pulmonary embolus, pneumonia or other acute cardiopulmonary process. 2. Stable chronic bilateral reticulonodular airspace opacities and underlying emphysema. Differential considerations include chronic hypersensitivity pneumonitis, sarcoidosis, and sequelae of old granulomatous disease superimposed on mild centrilobular emphysema. 3. Stable bilateral benign pulmonary granulomas. 4. Stable mild cardiomegaly with biatrial enlargement. 5. Surgical changes of prior mitral valve annuloplasty. CTA ABD/PELVIS 1. No acute vascular abnormality within the abdomen or pelvis. 2. Hepatic steatosis. 3. Colonic diverticular disease without CT evidence of active inflammation. Electronically Signed   By: Jacqulynn Cadet M.D.   On: 11/04/2018 13:22   Dg Femur Min 2 Views Right  Result Date: 11/04/2018 CLINICAL DATA:  Fall this morning, dizziness. RIGHT upper leg pain. EXAM: RIGHT FEMUR 2 VIEWS COMPARISON:  None. FINDINGS: RIGHT knee arthroplasty  hardware appears intact and appropriately position. Osseous alignment is normal. No fracture line or displaced fracture fragment seen. No appreciable joint effusion at the RIGHT knee joint. Soft tissues about the RIGHT femur are unremarkable. IMPRESSION: 1. Negative. 2. Hardware intact. Electronically Signed   By: Franki Cabot M.D.   On: 11/04/2018 11:16      Allergies as of 11/06/2018      Reactions   Amiodarone Nausea And Vomiting   Aspirin Swelling   Flexeril [cyclobenzaprine] Swelling   Trazamine [trazodone & Diet Manage Prod] Nausea And Vomiting   Codeine Rash   Tramadol Rash      Medication List    STOP taking these medications   diltiazem 240 MG 24 hr capsule Commonly known as:  CARDIZEM CD     TAKE these medications   bisacodyl 5 MG EC tablet Commonly known as:  DULCOLAX Take 2 tablets (10 mg total) by mouth daily as needed for moderate constipation.   bumetanide 2 MG tablet Commonly known as:  BUMEX Take 2 tablets (4 mg total) by mouth daily. What changed:  how much to take   butalbital-acetaminophen-caffeine 50-325-40 MG tablet Commonly known as:  FIORICET, ESGIC Take 1-2 tablets by mouth every 6 (six) hours as needed for headache.   gabapentin 300 MG capsule Commonly known as:  NEURONTIN Take 600 mg by mouth 3 (three) times daily.   ipratropium-albuterol 0.5-2.5 (3) MG/3ML Soln Commonly  known as:  DUONEB Inhale 3 mLs into the lungs 2 (two) times daily as needed (respiratory).   lactulose 10 GM/15ML solution Commonly known as:  CHRONULAC Take 45 mLs (30 g total) by mouth 2 (two) times daily as needed for mild constipation.   magnesium oxide 400 MG tablet Commonly known as:  MAG-OX Take 400 mg by mouth 2 (two) times daily.   metoprolol tartrate 100 MG tablet Commonly known as:  LOPRESSOR Take 1 tablet (100 mg total) by mouth 2 (two) times daily. What changed:    medication strength  how much to take   nicotine 7 mg/24hr patch Commonly known as:   NICODERM CQ - dosed in mg/24 hr Place 1 patch (7 mg total) onto the skin daily.   nitroGLYCERIN 0.4 MG SL tablet Commonly known as:  NITROSTAT Place 1 tablet (0.4 mg total) under the tongue every 5 (five) minutes as needed for chest pain.   omeprazole 40 MG capsule Commonly known as:  PRILOSEC Take 40 mg by mouth 2 (two) times daily.   oxyCODONE-acetaminophen 5-325 MG tablet Commonly known as:  PERCOCET/ROXICET Take 1-2 tablets by mouth every 8 (eight) hours as needed for moderate pain or severe pain.   promethazine 12.5 MG tablet Commonly known as:  PHENERGAN Take 1 tablet (12.5 mg total) by mouth every 6 (six) hours as needed for nausea or vomiting.   rivaroxaban 20 MG Tabs tablet Commonly known as:  XARELTO Take 20 mg by mouth every evening.   SYMBICORT 160-4.5 MCG/ACT inhaler Generic drug:  budesonide-formoterol Inhale 2 puffs into the lungs 2 (two) times daily.   tiZANidine 4 MG tablet Commonly known as:  ZANAFLEX Take 4 mg by mouth at bedtime.   topiramate 25 MG tablet Commonly known as:  TOPAMAX Take 25 mg by mouth 2 (two) times daily.         Management plans discussed with the patient and she is in agreement. Stable for discharge home  Patient should follow up with pcp  CODE STATUS:     Code Status Orders  (From admission, onward)         Start     Ordered   11/04/18 1602  Full code  Continuous     11/04/18 1601        Code Status History    Date Active Date Inactive Code Status Order ID Comments User Context   05/22/2018 1222 05/25/2018 2208 Full Code [id number]  Demetrios Loll, MD Inpatient   04/22/2018 1440 04/25/2018 1736 Full Code [id number]  Epifanio Lesches, MD ED   12/20/2017 1946 12/21/2017 1447 Full Code [id number]  Gorden Harms, MD ED   09/04/2016 1449 09/05/2016 1715 Full Code [id number]  Chriss Czar, PA-C Inpatient      TOTAL TIME TAKING CARE OF THIS PATIENT: 38 minutes.    Note: This dictation was prepared with Dragon  dictation along with smaller phrase technology. Any transcriptional errors that result from this process are unintentional.  Mackinzie Vuncannon M.D on 11/06/2018 at 9:23 AM  Between 7am to 6pm - Pager - [phone] After 6pm go to [email or web address] - password [email or web address]  Office  [phone]  CC: Primary care physician; Kathee Delton, MD

## 2018-11-05 NOTE — Care Management Note (Signed)
Case Management Note  Patient Details  Name: Chelsea Ramsey MRN: [id number] Date of Birth: May 23, 1966  Subjective/Objective:  Patient to be discharged per MD order. Orders in place for home health services. Per patient she lives at home with her husband. She uses a hover round chair. She is also on chronic O2 through [facility] and recently closed home health with [facility]. Patient tells me she is doing cardiac rehab now. She also tells me she follows with her "back Dr" and they have to do two more injections for her and then they will set up outpatient PT following these treatments. Patient feels comfortable with this plan going forward and does not want to pursue home health. Spouse to provide discharge transport.                    Action/Plan:   Expected Discharge Date:  11/05/18               Expected Discharge Plan:     In-House Referral:     Discharge planning Services     Post Acute Care Choice:    Choice offered to:     DME Arranged:    DME Agency:     HH Arranged:    HH Agency:     Status of Service:     If discussed at [employer], dates discussed:    Additional Comments:  [name], RN 11/05/2018, 10:57 AM

## 2018-11-05 NOTE — Progress Notes (Signed)
[facility]  SUBJECTIVE: Patient laying in bed, complains of palpitations   Vitals:   11/04/18 2108 11/05/18 0629 11/05/18 0631 11/05/18 0839  BP: 112/75 97/65 95/78  96/83  Pulse: 96 83 82 87  Resp: 17 16  16   Temp: 98.4 F (36.9 C) 98.3 F (36.8 C)    TempSrc: Oral Oral    SpO2: 98% 100%  100%  Weight:      Height:         Intake/Output Summary (Last 24 hours) at 11/05/2018 0910 Last data filed at 11/05/2018 0510 Gross per 24 hour  Intake -  Output 500 ml  Net -500 ml      PHYSICAL EXAM  General: Well developed, well nourished, in no acute distress HEENT:  Normocephalic and atramatic Neck:  No JVD.  Lungs: Clear bilaterally to auscultation and percussion. Heart: HRRR . Normal S1 and S2 without gallops or murmurs.  Abdomen: Bowel sounds are positive, abdomen soft and non-tender  Msk:  Back normal, normal gait. Normal strength and tone for age. Extremities: No clubbing, cyanosis or edema.   Neuro: Alert and oriented X 3. Psych:  Good affect, responds appropriately   LABS: Basic Metabolic Panel: Recent Labs    11/04/18 1024  NA 140  K 4.0  CL 103  CO2 29  GLUCOSE 105*  BUN 11  CREATININE 0.47  CALCIUM 9.0   Liver Function Tests: Recent Labs    11/04/18 1024  AST 25  ALT 16  ALKPHOS 109  BILITOT 0.7  PROT 7.1  ALBUMIN 3.7   Recent Labs    11/04/18 1024  LIPASE 38   CBC: Recent Labs    11/04/18 1024  WBC 9.4  NEUTROABS 6.8  HGB 11.9*  HCT 39.5  MCV 80.0  PLT 248   Cardiac Enzymes: Recent Labs    11/04/18 1855 11/04/18 2127 11/05/18 0652  TROPONINI <0.03 <0.03 <0.03   BNP: Invalid input(s): POCBNP D-Dimer: No results for input(s): DDIMER in the last 72 hours. Hemoglobin A1C: No results for input(s): HGBA1C in the last 72 hours. Fasting Lipid Panel: No results for input(s): CHOL, HDL, LDLCALC, TRIG, CHOLHDL, LDLDIRECT in the last 72 hours. Thyroid Function Tests: No results for input(s): TSH, T4TOTAL, T3FREE, THYROIDAB in  the last 72 hours.  Invalid input(s): FREET3 Anemia Panel: No results for input(s): VITAMINB12, FOLATE, FERRITIN, TIBC, IRON, RETICCTPCT in the last 72 hours.  Dg Chest Portable 1 View  Result Date: 11/04/2018 CLINICAL DATA:  Dizziness with fall this morning. LEFT-sided chest pain 2 days ago. Total knee replacement 2017. Cardiac ablation surgery earlier this year. Additional history of asthma, COPD, CHF, coronary artery disease, hypertension, current smoker. EXAM: PORTABLE CHEST 1 VIEW COMPARISON:  Chest x-rays dated 10/11/2018 12/20/2017. FINDINGS: Median sternotomy wires appear intact and stable in alignment. Mitral valve replacement hardware and atrial clip appear grossly stable in position. Stable mild cardiomegaly. Mild peribronchial cuffing and subtle interstitial thickening again noted bilaterally suggesting chronic mild CHF. No evidence of overt alveolar pulmonary edema. No evidence of superimposed pneumonia. No pleural effusion or pneumothorax seen. No acute or suspicious osseous finding. IMPRESSION: 1. No active disease. No evidence of pneumonia or pulmonary edema. 2. Stable mild cardiomegaly. 3. Probable chronic mild CHF. Electronically Signed   By: Franki Cabot M.D.   On: 11/04/2018 11:19   Ct Angio Chest/abd/pel For Dissection W And/or Wo Contrast  Result Date: 11/04/2018 CLINICAL DATA:  52 year old female with a 2 day history of chest abdominal pain accompanied by presyncope. Clinical  history includes atrial fibrillation and prior cardiac surgeries. EXAM: CT ANGIOGRAPHY CHEST, ABDOMEN AND PELVIS TECHNIQUE: Multidetector CT imaging through the chest, abdomen and pelvis was performed using the standard protocol during bolus administration of intravenous contrast. Multiplanar reconstructed images and MIPs were obtained and reviewed to evaluate the vascular anatomy. CONTRAST:  144mL ISOVUE-370 IOPAMIDOL (ISOVUE-370) INJECTION 76% COMPARISON:  Most recent prior CT scan of the chest 07/17/2018;  MRI lumbar spine 01/07/2018; prior chest CT 12/08/2013 FINDINGS: CTA CHEST FINDINGS Cardiovascular: Initial unenhanced CT imaging demonstrates no evidence of acute intramural hematoma. Following the administration of IV contrast, the thoracic aorta and pulmonary artery are both well opacified. Conventional 3 vessel arch anatomy. The aortic root, ascending, transverse and descending thoracic aorta are all normal in caliber. The main pulmonary artery is normal in caliber. No evidence of central filling defect to suggest acute PE. Surgical changes consistent with prior left atrial appendage ligation and mitral valvuloplasty. Mild cardiomegaly with biatrial enlargement. No pericardial effusion. Mediastinum/Nodes: Stable strandy soft tissue in the anterior mediastinum either representing the sequelae of prior surgical intervention, or residual thymic tissue. No significant interval change compared 07/17/2018. Unremarkable thoracic esophagus. No suspicious lymphadenopathy. Lungs/Pleura: Stable scattered bilateral calcified and noncalcified granulomas dating back to at least January of 2015. Additionally, there is a pattern diffuse reticulonodular airspace opacities predominantly in the bilateral upper lungs which also remains unchanged across several prior studies dating back to January of 2015. No definite acute airspace opacification, pulmonary edema, pleural effusion or pneumothorax. Stable mild diffuse bronchial wall thickening. Mild centrilobular pulmonary emphysema. Musculoskeletal: No acute fracture or aggressive appearing lytic or blastic osseous lesion. Review of the MIP images confirms the above findings. CTA ABDOMEN AND PELVIS FINDINGS VASCULAR Aorta: Normal caliber aorta without aneurysm, dissection, vasculitis or significant stenosis. Celiac: Patent without evidence of aneurysm, dissection, vasculitis or significant stenosis. SMA: Patent without evidence of aneurysm, dissection, vasculitis or significant  stenosis. Renals: Both renal arteries are patent without evidence of aneurysm, dissection, vasculitis, fibromuscular dysplasia or significant stenosis. IMA: Patent without evidence of aneurysm, dissection, vasculitis or significant stenosis. Inflow: Patent without evidence of aneurysm, dissection, vasculitis or significant stenosis. Veins: No obvious venous abnormality within the limitations of this arterial phase study. Review of the MIP images confirms the above findings. NON-VASCULAR Hepatobiliary: Normal hepatic contour and morphology. Diffuse low attenuation of the hepatic parenchyma suggestive of underlying steatosis. No discrete hepatic lesions. Normal appearance of the gallbladder. No intra or extrahepatic biliary ductal dilatation. Pancreas: Unremarkable. No pancreatic ductal dilatation or surrounding inflammatory changes. Spleen: Normal in size without focal abnormality. Adrenals/Urinary Tract: Adrenal glands are unremarkable. Kidneys are normal, without renal calculi, focal lesion, or hydronephrosis. Bladder is unremarkable. Stomach/Bowel: Colonic diverticular disease without CT evidence of active inflammation. No evidence of obstruction or focal bowel wall thickening. Normal appendix in the right lower quadrant. The terminal ileum is unremarkable. Lymphatic: No suspicious lymphadenopathy. Reproductive: Uterus and bilateral adnexa are unremarkable. Other: No abdominal wall hernia or abnormality. No abdominopelvic ascites. Musculoskeletal: No acute fracture or aggressive appearing lytic or blastic osseous lesion. Review of the MIP images confirms the above findings. IMPRESSION: CTA CHEST 1. Negative for acute vascular abnormality, central pulmonary embolus, pneumonia or other acute cardiopulmonary process. 2. Stable chronic bilateral reticulonodular airspace opacities and underlying emphysema. Differential considerations include chronic hypersensitivity pneumonitis, sarcoidosis, and sequelae of old  granulomatous disease superimposed on mild centrilobular emphysema. 3. Stable bilateral benign pulmonary granulomas. 4. Stable mild cardiomegaly with biatrial enlargement. 5. Surgical changes of prior mitral valve annuloplasty.  CTA ABD/PELVIS 1. No acute vascular abnormality within the abdomen or pelvis. 2. Hepatic steatosis. 3. Colonic diverticular disease without CT evidence of active inflammation. Electronically Signed   By: Jacqulynn Cadet M.D.   On: 11/04/2018 13:22   Dg Femur Min 2 Views Right  Result Date: 11/04/2018 CLINICAL DATA:  Fall this morning, dizziness. RIGHT upper leg pain. EXAM: RIGHT FEMUR 2 VIEWS COMPARISON:  None. FINDINGS: RIGHT knee arthroplasty hardware appears intact and appropriately position. Osseous alignment is normal. No fracture line or displaced fracture fragment seen. No appreciable joint effusion at the RIGHT knee joint. Soft tissues about the RIGHT femur are unremarkable. IMPRESSION: 1. Negative. 2. Hardware intact. Electronically Signed   By: Franki Cabot M.D.   On: 11/04/2018 11:16     Echo EF 50 to 55%  TELEMETRY: Normal sinus rhythm:  ASSESSMENT AND PLAN:  Active Problems:   Chest pain    1.  Chest pain, atypical features, no normal coronary anatomy by recent cardiac catheterization, troponin negative x 3 2.  Palpitations, normal ECG, predominant normal sinus rhythm on telemetry with infrequent premature atrial contractions, Toprol tartrate increased to 100 mg twice daily 3.  Post operative atrial fibrillation, as post pulmonary vein isolation and left atrial appendage ligation 4.  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  Continue current medication 2.  Continue metoprolol tartrate 100 mg twice daily 3.  Continue Xarelto for stroke prevention 4.  Defer further cardiac diagnostics at this time  Sign off for now, please call if any questions   Isaias Cowman, MD, PhD,  [facility] 11/05/2018 9:10 AM

## 2018-11-06 MED ORDER — PROMETHAZINE HCL 12.5 MG PO TABS: 12.5000 mg | ORAL_TABLET | Freq: Four times a day (QID) | ORAL | 0 refills | Status: DC | PRN

## 2018-11-06 NOTE — Progress Notes (Signed)
Patient discharged to home with family.   Brought to car via wheelchair. IV removed prior to discharge.  Patient verbalizes understanding of discharge instructions.

## 2018-11-06 NOTE — Plan of Care (Signed)
  Problem: Activity: Goal: Risk for activity intolerance will decrease Outcome: Progressing Note:  Up to bathroom with standby assist, tolerating well   Problem: Nutrition: Goal: Adequate nutrition will be maintained Outcome: Progressing   Problem: Elimination: Goal: Will not experience complications related to urinary retention Outcome: Progressing   Problem: Safety: Goal: Ability to remain free from injury will improve Outcome: Progressing   Problem: Skin Integrity: Goal: Risk for impaired skin integrity will decrease Outcome: Progressing   Problem: Elimination: Goal: Will not experience complications related to bowel motility Outcome: Not Progressing Note:  Gave pt dulcolax once and prune juice twice, pt had also received lactulose on day shift with no relief   Problem: Education: Goal: Knowledge of General Education information will improve Description Including pain rating scale, medication(s)/side effects and non-pharmacologic comfort measures Outcome: Completed/Met

## 2018-11-07 ENCOUNTER — Telehealth: Payer: Self-pay | Admitting: *Deleted

## 2018-11-07 ENCOUNTER — Encounter: Payer: Self-pay | Admitting: *Deleted

## 2018-11-07 DIAGNOSIS — Z952 Presence of prosthetic heart valve: Secondary | ICD-10-CM

## 2018-11-07 NOTE — Telephone Encounter (Signed)
Called to check on pt.  She had another round of atypical chest pain, during which she was admitted.  They did not find any cause other than anxiety.  She was discharged and will schedule a follow up with cardiology.  She was encouraged to continue with rehab.  She hopes to return next week.

## 2018-11-13 NOTE — Progress Notes (Signed)
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 05/25/2018); and Mitral valve replacement. Family: family history includes Asthma in her mother; COPD in her mother; Cancer in her mother; Congestive Heart Failure in her father.  Constitutional Exam  General appearance: Well nourished, well developed, and well hydrated. In no apparent acute distress Vitals:   11/14/18 1035  BP: 108/66  Pulse: 96  Resp: 18  Temp: 97.8 F (36.6 C)  TempSrc: Oral  SpO2: 100%  Weight: 267 lb (121.1 kg)  Height: 5' 4"  (1.626 m)   BMI Assessment: Estimated body mass index is 45.83 kg/m as calculated from the following:   Height as of this encounter: 5' 4"  (1.626 m).   Weight as of this encounter: 267 lb (121.1 kg).  BMI interpretation table: BMI level Category Range association with higher incidence of chronic  pain  <18 kg/m2 Underweight   18.5-24.9 kg/m2 Ideal body weight   25-29.9 kg/m2 Overweight Increased incidence by 20%  30-34.9 kg/m2 Obese (Class I) Increased incidence by 68%  35-39.9 kg/m2 Severe obesity (Class II) Increased incidence by 136%  >40 kg/m2 Extreme obesity (Class III) Increased incidence by 254%   Patient's current BMI Ideal Body weight  Body mass index is 45.83 kg/m. Ideal body weight: 54.7 kg (120 lb 9.5 oz) Adjusted ideal body weight: 81.3 kg (179 lb 2.5 oz)   BMI Readings from Last 4 Encounters:  11/14/18 45.83 kg/m  11/04/18 39.75 kg/m  10/11/18 43.90 kg/m  10/10/18 44.11 kg/m   Wt Readings from Last 4 Encounters:  11/14/18  267 lb (121.1 kg)  11/04/18 231 lb 9.6 oz (105.1 kg)  10/11/18 255 lb 11.7 oz (116 kg)  10/10/18 257 lb (116.6 kg)  Psych/Mental status: Alert, oriented x 3 (person, place, & time)       Eyes: PERLA Respiratory: No evidence of acute respiratory distress  Cervical Spine Area Exam  Skin & Axial Inspection: No masses, redness, edema, swelling, or associated skin lesions Alignment: Symmetrical Functional ROM: Unrestricted ROM      Stability: No instability detected Muscle Tone/Strength: Functionally intact. No obvious neuro-muscular anomalies detected. Sensory (Neurological): Unimpaired Palpation: No palpable anomalies              Upper Extremity (UE) Exam    Side: Right upper extremity  Side: Left upper extremity  Skin & Extremity Inspection: Skin color, temperature, and hair growth are WNL. No peripheral edema or cyanosis. No masses, redness, swelling, asymmetry, or associated skin lesions. No contractures.  Skin & Extremity Inspection: Skin color, temperature, and hair growth are WNL. No peripheral edema or cyanosis. No masses, redness, swelling, asymmetry, or associated skin lesions. No contractures.  Functional ROM: Unrestricted ROM          Functional ROM: Unrestricted ROM          Muscle Tone/Strength: Functionally intact. No  obvious neuro-muscular anomalies detected.  Muscle Tone/Strength: Functionally intact. No obvious neuro-muscular anomalies detected.  Sensory (Neurological): Unimpaired          Sensory (Neurological): Unimpaired          Palpation: No palpable anomalies              Palpation: No palpable anomalies              Provocative Test(s):  Phalen's test: deferred Tinel's test: deferred Apley's scratch test (touch opposite shoulder):  Action 1 (Across chest): deferred Action 2 (Overhead): deferred Action 3 (LB reach): deferred   Provocative Test(s):  Phalen's test: deferred Tinel's test: deferred Apley's scratch test (touch opposite shoulder):  Action 1 (Across chest): deferred Action 2 (Overhead): deferred Action 3 (LB reach): deferred    Thoracic Spine Area Exam  Skin & Axial Inspection: No masses, redness, or swelling Alignment: Symmetrical Functional ROM: Unrestricted ROM Stability: No instability detected Muscle Tone/Strength: Functionally intact. No obvious neuro-muscular anomalies detected. Sensory (Neurological): Unimpaired Muscle strength & Tone: No palpable anomalies  Lumbar Spine Area Exam  Skin & Axial Inspection: No masses, redness, or swelling Alignment: Symmetrical Functional ROM: Unrestricted ROM       Stability: No instability detected Muscle Tone/Strength: Functionally intact. No obvious neuro-muscular anomalies detected. Sensory (Neurological): Unimpaired Palpation: No palpable anomalies       Provocative Tests: Hyperextension/rotation test: deferred today       Lumbar quadrant test (Kemp's test): deferred today       Lateral bending test: deferred today       Patrick's Maneuver: deferred today                   FABER* test: deferred today                   S-I anterior distraction/compression test: deferred today         S-I lateral compression test: deferred today         S-I Thigh-thrust test: deferred today         S-I Gaenslen's test: deferred today          *(Flexion, ABduction and External Rotation)  Gait & Posture  Assessment  Ambulation: Patient ambulates using a cane Gait: Significantly limited. Dependent on assistive device to ambulate Posture: WNL   Lower Extremity Exam    Side: Right lower extremity  Side: Left lower extremity  Stability: No instability observed          Stability: No instability observed          Skin & Extremity Inspection: Skin color, temperature, and hair growth are WNL. No peripheral edema or cyanosis. No masses, redness, swelling, asymmetry, or associated skin lesions. No contractures.  Skin & Extremity Inspection: Skin color, temperature, and hair growth are WNL. No peripheral edema or cyanosis. No masses, redness, swelling, asymmetry, or associated skin lesions. No contractures.  Functional ROM: Unrestricted ROM                  Functional ROM: Unrestricted ROM                  Muscle Tone/Strength: Deconditioned  Muscle Tone/Strength: Deconditioned  Sensory (Neurological): Unimpaired        Sensory (Neurological): Unimpaired        DTR: Patellar: deferred today Achilles: deferred today Plantar: deferred today  DTR: Patellar: deferred today Achilles: deferred today Plantar: deferred today  Palpation: No palpable anomalies  Palpation: No palpable anomalies   Assessment & Plan  Primary Diagnosis & Pertinent Problem List: The primary encounter diagnosis was Chronic pain syndrome. 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  Visit  Diagnosis: 1. Chronic pain syndrome   2. Class 3 severe obesity due to excess calories with serious comorbidity and body mass index (BMI) of 45.0 to 49.9 in adult ([place])   3. Chronic low back pain (Primary Area of Pain) (Bilateral) w/ sciatica (Left)   4. Chronic lower extremity pain (Secondary Area of Pain) (Left)   5. Sternal pain (Tertiary Area of Pain)   6. Fibromyalgia (Fourth Area of Pain)   7. Chronic knee pain (Right)   8. 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 Chronic anticoagulation (Xarelto)   10. 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)  Chronic low back pain (Primary Area of Pain) (Bilateral) w/ sciatica (Left)  Chronic lower extremity pain (Secondary Area of Pain) (Left)  Sternal pain (Tertiary Area of Pain)  Fibromyalgia (Fourth Area of Pain)  Chronic knee pain (Fifth Area of Pain) (Right)  Chronic Sacroiliac Joint Pain  Chronic Pain Syndrome  Primary Localized Osteoarthritis of Right Knee  Class 3 Severe Obesity With Body Mass Index (Bmi) of 45.0 to 49.9 in Adult ([facility])  Chronic anticoagulation (Xarelto)  Vitamin D Deficiency  Long Term Current Use of Opiate Analgesic  Pharmacologic Therapy  Disorder of Skeletal System  Problems Influencing Health Status  Chest Pain  Chronic Diastolic Heart Failure ([facility])  Htn (Hypertension)  Acute Chf (Congestive Heart Failure) ([facility])  Melena  Nausea and Vomiting  Acute Esophagogastric Ulcer  Intractable Vomiting With Nausea  Gib (Gastrointestinal Bleeding)  A-Fib ([facility])  Copd (Chronic  Obstructive Pulmonary Disease) ([facility])  Copd (Chronic Obstructive Pulmonary Disease) With Acute Bronchitis ([facility])   Plan of Care   Pharmacotherapy (Medications Ordered): Meds ordered this encounter  Medications  . ergocalciferol (VITAMIN D2) 1.25 MG (50000 UT) capsule    Sig: Take 1 capsule (50,000 Units total) by mouth 2 (two) times a  week. X 6 weeks.    Dispense:  12 capsule    Refill:  0    Do not add this medication to the electronic "Automatic Refill" notification system. Patient may have prescription filled one day early if pharmacy is closed on scheduled refill date.  . Cholecalciferol (VITAMIN D3) 125 MCG (5000 UT) CAPS    Sig: Take 1 capsule (5,000 Units total) by mouth daily with breakfast. Take along with calcium and magnesium.    Dispense:  30 capsule    Refill:  5    Do not place medication on "Automatic Refill".  May substitute with similar over-the-counter product.  . Magnesium 500 MG CAPS    Sig: Take 1 capsule (500 mg total) by mouth 2 (two) times daily at 8 am and 10 pm.    Dispense:  60 capsule    Refill:  5    Do not place medication on "Automatic Refill".  The patient may use similar over-the-counter product.  . Calcium Carbonate-Vit D-Min (GNP CALCIUM 1200) 1200-1000 MG-UNIT CHEW    Sig: Chew 1,200 mg by mouth daily with breakfast. Take in combination with vitamin D and magnesium.    Dispense:  30 tablet    Refill:  5    Do not place medication on "Automatic Refill".  May substitute with similar over-the-counter product.  Marland Kitchen DISCONTD: Oxycodone HCl 10 MG TABS    Sig: Take 0.5-1 tablets (5-10 mg total) by mouth 2 (two) times daily as needed for up to 7 days. "Must last 30 days." -error    Dispense:  14 tablet    Refill:  0    [place] STOP ACT - Not applicable. Fill one day early if pharmacy is closed on scheduled refill date. Do not fill until: 11/14/18 . "Must last 30 days." - error To last until: 11/21/18.  Marland Kitchen DISCONTD: Oxycodone HCl 10 MG TABS    Sig: Take 0.5-1 tablets (5-10 mg total) by mouth 2 (two) times daily as needed. Must last 30 days. MAX.: 2/day    Dispense:  60 tablet    Refill:  0    [place] STOP ACT - Not applicable. Fill one day early if pharmacy is closed on scheduled refill date. Do not fill until: "11/24/18" - error . Must last 30 days. To last until: "12/24/18." -error  . Oxycodone HCl 10  MG TABS    Sig: Take 0.5-1 tablets (5-10 mg total) by mouth 2 (two) times daily as needed for up to 7 days. Must last 7 days.    Dispense:  14 tablet    Refill:  0    [place] STOP ACT - Not applicable. Fill one day early if pharmacy is closed on scheduled refill date. Do not fill until: 11/14/18 . Must last 7 days. To last until: 11/21/18.  Marland Kitchen Oxycodone HCl 10 MG TABS    Sig: Take 0.5-1 tablets (5-10 mg total) by mouth 2 (two) times daily as needed. Must last 30 days. MAX.: 2/day    Dispense:  60 tablet    Refill:  0    [place] STOP ACT - Not applicable. Fill one day early if pharmacy is closed on  scheduled refill date. Do not fill until: 11/21/18 . Must last 30 days. To last until: 12/21/18.   Procedure Orders    No procedure(s) ordered today   Lab Orders  No laboratory test(s) ordered today   Imaging Orders  No imaging studies ordered today    Referral Orders     Amb Ref to Medical Weight Management - patient instructed to bring down her weight to a BMI ideally less than 30, but at least less than 35.     Amb Referral to Bariatric Surgery - the patient is being sent to see if she would be a candidate for bariatric surgery to bring her BMI down.  Pharmacological management options:  Opioid Analgesics: We'll take over management today. See above orders Membrane stabilizer: We have discussed the possibility of optimizing this mode of therapy, if tolerated Muscle relaxant: We have discussed the possibility of a trial NSAID: We have discussed the possibility of a trial Other analgesic(s): To be determined at a later time   Interventional management options: Planned, scheduled, and/or pending:    NOTE: XARELTO Anticoagulation. Stop x 3 days prior to procedures and re-start 6 hours after. We need to have clearance to stop the patient's Xarelto before proceeding with any interventional therapies.   Considering:   Diagnostic bilateral lumbar facet nerve blocks  Diagnostic bilateral lumbar facet  radiofrequency ablation  Trigger point injections  Diagnostic right knee genicular nerve block  Possible right knee genicular nerve radiofrequency ablation    PRN Procedures:   None at this time   Provider-requested follow-up: Return for Med-Mgmt, w/ Dr. Dossie Arbour.  Future Appointments  Date Time Provider [place]  11/15/2018  9:05 AM [facility] PH[facility] None  11/17/2018  9:05 AM [facility] II [facility]C [facility] None  11/22/2018  9:05 AM [facility] PH[facility] EXC [facility] None  11/24/2018  9:05 AM [facility] PH[facility] II EXC [facility] None  12/05/2018 10:45 AM Milinda Pointer, MD [facility] None    Primary Care Physician: Kathee Delton, MD Location: [facility] Note by: Gaspar Cola, MD Date: 11/14/2018; Time: 12:16 PM

## 2018-11-14 ENCOUNTER — Ambulatory Visit: Payer: Medicaid Other | Attending: Pain Medicine | Admitting: Pain Medicine

## 2018-11-14 ENCOUNTER — Other Ambulatory Visit: Payer: Self-pay

## 2018-11-14 ENCOUNTER — Encounter: Payer: Self-pay | Admitting: Pain Medicine

## 2018-11-14 VITALS — BP 108/66 | HR 96 | Temp 97.8°F | Resp 18 | Ht 64.0 in | Wt 267.0 lb

## 2018-11-14 DIAGNOSIS — Z6841 Body Mass Index (BMI) 40.0 and over, adult: Secondary | ICD-10-CM | POA: Insufficient documentation

## 2018-11-14 DIAGNOSIS — M79604 Pain in right leg: Secondary | ICD-10-CM | POA: Insufficient documentation

## 2018-11-14 DIAGNOSIS — J449 Chronic obstructive pulmonary disease, unspecified: Secondary | ICD-10-CM | POA: Insufficient documentation

## 2018-11-14 DIAGNOSIS — Z96651 Presence of right artificial knee joint: Secondary | ICD-10-CM | POA: Insufficient documentation

## 2018-11-14 DIAGNOSIS — G8929 Other chronic pain: Secondary | ICD-10-CM | POA: Insufficient documentation

## 2018-11-14 DIAGNOSIS — M797 Fibromyalgia: Secondary | ICD-10-CM | POA: Diagnosis not present

## 2018-11-14 DIAGNOSIS — M79605 Pain in left leg: Secondary | ICD-10-CM | POA: Diagnosis not present

## 2018-11-14 DIAGNOSIS — M25562 Pain in left knee: Secondary | ICD-10-CM | POA: Insufficient documentation

## 2018-11-14 DIAGNOSIS — M25561 Pain in right knee: Secondary | ICD-10-CM | POA: Insufficient documentation

## 2018-11-14 DIAGNOSIS — I110 Hypertensive heart disease with heart failure: Secondary | ICD-10-CM | POA: Insufficient documentation

## 2018-11-14 DIAGNOSIS — M545 Low back pain: Secondary | ICD-10-CM | POA: Diagnosis present

## 2018-11-14 DIAGNOSIS — I4891 Unspecified atrial fibrillation: Secondary | ICD-10-CM | POA: Insufficient documentation

## 2018-11-14 DIAGNOSIS — Z79899 Other long term (current) drug therapy: Secondary | ICD-10-CM | POA: Insufficient documentation

## 2018-11-14 DIAGNOSIS — Z79891 Long term (current) use of opiate analgesic: Secondary | ICD-10-CM | POA: Diagnosis not present

## 2018-11-14 DIAGNOSIS — M5442 Lumbago with sciatica, left side: Secondary | ICD-10-CM | POA: Diagnosis not present

## 2018-11-14 DIAGNOSIS — E559 Vitamin D deficiency, unspecified: Secondary | ICD-10-CM

## 2018-11-14 DIAGNOSIS — G894 Chronic pain syndrome: Secondary | ICD-10-CM | POA: Diagnosis not present

## 2018-11-14 DIAGNOSIS — I509 Heart failure, unspecified: Secondary | ICD-10-CM | POA: Diagnosis not present

## 2018-11-14 DIAGNOSIS — R0789 Other chest pain: Secondary | ICD-10-CM

## 2018-11-14 DIAGNOSIS — Z7901 Long term (current) use of anticoagulants: Secondary | ICD-10-CM | POA: Insufficient documentation

## 2018-11-14 MED ORDER — OXYCODONE HCL 10 MG PO TABS: 5.0000 mg | ORAL_TABLET | Freq: Two times a day (BID) | ORAL | 0 refills | Status: DC | PRN

## 2018-11-14 MED ORDER — ERGOCALCIFEROL 1.25 MG (50000 UT) PO CAPS: 50000.0000 [IU] | ORAL_CAPSULE | ORAL | 0 refills | Status: AC

## 2018-11-14 MED ORDER — MAGNESIUM 500 MG PO CAPS: 500.0000 mg | ORAL_CAPSULE | Freq: Two times a day (BID) | ORAL | 5 refills | Status: DC

## 2018-11-14 MED ORDER — VITAMIN D3 125 MCG (5000 UT) PO CAPS: 1.0000 | ORAL_CAPSULE | Freq: Every day | ORAL | 5 refills | Status: AC

## 2018-11-14 MED ORDER — GNP CALCIUM 1200 1200-1000 MG-UNIT PO CHEW: 1200.0000 mg | CHEWABLE_TABLET | Freq: Every day | ORAL | 5 refills | Status: DC

## 2018-11-14 NOTE — Progress Notes (Signed)
Safety precautions to be maintained throughout the outpatient stay will include: orient to surroundings, keep bed in low position, maintain call bell within reach at all times, provide assistance with transfer out of bed and ambulation.  

## 2018-11-14 NOTE — Patient Instructions (Addendum)
____________________________________________________________________________________________  Medication Rules  Purpose: To inform patients, and their family members, of our rules and regulations.  Applies to: All patients receiving prescriptions (written or electronic).  Pharmacy of record: Pharmacy where electronic prescriptions will be sent. If written prescriptions are taken to a different pharmacy, please inform the nursing staff. The pharmacy listed in the electronic medical record should be the one where you would like electronic prescriptions to be sent.  Electronic prescriptions: In compliance with the [place] Strengthen Opioid Misuse Prevention (STOP) Act of 2017 (Session Law 2017-74/H243), effective November 30, 2018, all controlled substances must be electronically prescribed. Calling prescriptions to the pharmacy will cease to exist.  Prescription refills: Only during scheduled appointments. Applies to all prescriptions.  NOTE: The following applies primarily to controlled substances (Opioid* Pain Medications).   Patient's responsibilities: 1. Pain Pills: Bring all pain pills to every appointment (except for procedure appointments). 2. Pill Bottles: Bring pills in original pharmacy bottle. Always bring the newest bottle. Bring bottle, even if empty. 3. Medication refills: You are responsible for knowing and keeping track of what medications you take and those you need refilled. The day before your appointment: write a list of all prescriptions that need to be refilled. The day of the appointment: give the list to the admitting nurse. Prescriptions will be written only during appointments. If you forget a medication: it will not be "Called in", "Faxed", or "electronically sent". You will need to get another appointment to get these prescribed. No early refills. Do not call asking to have your prescription filled early. 4. Prescription Accuracy: You are responsible for  carefully inspecting your prescriptions before leaving our office. Have the discharge nurse carefully go over each prescription with you, before taking them home. Make sure that your name is accurately spelled, that your address is correct. Check the name and dose of your medication to make sure it is accurate. Check the number of pills, and the written instructions to make sure they are clear and accurate. Make sure that you are given enough medication to last until your next medication refill appointment. 5. Taking Medication: Take medication as prescribed. When it comes to controlled substances, taking less pills or less frequently than prescribed is permitted and encouraged. Never take more pills than instructed. Never take medication more frequently than prescribed.  6. Inform other Doctors: Always inform, all of your healthcare providers, of all the medications you take. 7. Pain Medication from other Providers: You are not allowed to accept any additional pain medication from any other Doctor or Healthcare provider. There are two exceptions to this rule. (see below) In the event that you require additional pain medication, you are responsible for notifying us, as stated below. 8. Medication Agreement: You are responsible for carefully reading and following our Medication Agreement. This must be signed before receiving any prescriptions from our practice. Safely store a copy of your signed Agreement. Violations to the Agreement will result in no further prescriptions. (Additional copies of our Medication Agreement are available upon request.) 9. Laws, Rules, & Regulations: All patients are expected to follow all Federal and State Laws, Statutes, Rules, & Regulations. Ignorance of the Laws does not constitute a valid excuse. The use of any illegal substances is prohibited. 10. Adopted CDC guidelines & recommendations: Target dosing levels will be at or below 60 MME/day. Use of benzodiazepines** is not  recommended.  Exceptions: There are only two exceptions to the rule of not receiving pain medications from other Healthcare Providers. 1.   Exception #1 (Emergencies): In the event of an emergency (i.e.: accident requiring emergency care), you are allowed to receive additional pain medication. However, you are responsible for: As soon as you are able, call our office [phone], at any time of the day or night, and leave a message stating your name, the date and nature of the emergency, and the name and dose of the medication prescribed. In the event that your call is answered by a member of our staff, make sure to document and save the date, time, and the name of the person that took your information.  2. Exception #2 (Planned Surgery): In the event that you are scheduled by another doctor or dentist to have any type of surgery or procedure, you are allowed (for a period no longer than 30 days), to receive additional pain medication, for the acute post-op pain. However, in this case, you are responsible for picking up a copy of our "Post-op Pain Management for Surgeons" handout, and giving it to your surgeon or dentist. This document is available at our office, and does not require an appointment to obtain it. Simply go to our office during business hours (Monday-Thursday from 8:00 AM to 4:00 PM) (Friday 8:00 AM to 12:00 Noon) or if you have a scheduled appointment with us, prior to your surgery, and ask for it by name. In addition, you will need to provide us with your name, name of your surgeon, type of surgery, and date of procedure or surgery.  *Opioid medications include: morphine, codeine, oxycodone, oxymorphone, hydrocodone, hydromorphone, meperidine, tramadol, tapentadol, buprenorphine, fentanyl, methadone. **Benzodiazepine medications include: diazepam (Valium), alprazolam (Xanax), clonazepam (Klonopine), lorazepam (Ativan), clorazepate (Tranxene), chlordiazepoxide (Librium), estazolam (Prosom),  oxazepam (Serax), temazepam (Restoril), triazolam (Halcion) (Last updated: 01/27/2018) ____________________________________________________________________________________________   ____________________________________________________________________________________________  Medication Recommendations and Reminders  Applies to: All patients receiving prescriptions (written and/or electronic).  Medication Rules & Regulations: These rules and regulations exist for your safety and that of others. They are not flexible and neither are we. Dismissing or ignoring them will be considered "non-compliance" with medication therapy, resulting in complete and irreversible termination of such therapy. (See document titled "Medication Rules" for more details.) In all conscience, because of safety reasons, we cannot continue providing a therapy where the patient does not follow instructions.  Pharmacy of record:   Definition: This is the pharmacy where your electronic prescriptions will be sent.   We do not endorse any particular pharmacy.  You are not restricted in your choice of pharmacy.  The pharmacy listed in the electronic medical record should be the one where you want electronic prescriptions to be sent.  If you choose to change pharmacy, simply notify our nursing staff of your choice of new pharmacy.  Recommendations:  Keep all of your pain medications in a safe place, under lock and key, even if you live alone.   After you fill your prescription, take 1 week's worth of pills and put them away in a safe place. You should keep a separate, properly labeled bottle for this purpose. The remainder should be kept in the original bottle. Use this as your primary supply, until it runs out. Once it's gone, then you know that you have 1 week's worth of medicine, and it is time to come in for a prescription refill. If you do this correctly, it is unlikely that you will ever run out of medicine.  To  make sure that the above recommendation works, it is very important that you make sure   your medication refill appointments are scheduled at least 1 week before you run out of medicine. To do this in an effective manner, make sure that you do not leave the office without scheduling your next medication management appointment. Always ask the nursing staff to show you in your prescription , when your medication will be running out. Then arrange for the receptionist to get you a return appointment, at least 7 days before you run out of medicine. Do not wait until you have 1 or 2 pills left, to come in. This is very poor planning and does not take into consideration that we may need to cancel appointments due to bad weather, sickness, or emergencies affecting our staff.  "Partial Fill": If for any reason your pharmacy does not have enough pills/tablets to completely fill or refill your prescription, do not allow for a "partial fill". You will need a separate prescription to fill the remaining amount, which we will not provide. If the reason for the partial fill is your insurance, you will need to talk to the pharmacist about payment alternatives for the remaining tablets, but again, do not accept a partial fill.  Prescription refills and/or changes in medication(s):   Prescription refills, and/or changes in dose or medication, will be conducted only during scheduled medication management appointments. (Applies to both, written and electronic prescriptions.)  No refills on procedure days. No medication will be changed or started on procedure days. No changes, adjustments, and/or refills will be conducted on a procedure day. Doing so will interfere with the diagnostic portion of the procedure.  No phone refills. No medications will be "called into the pharmacy".  No Fax refills.  No weekend refills.  No Holliday refills.  No after hours refills.  Remember:  Business hours are:  Monday to Thursday 8:00  AM to 4:00 PM Provider's Schedule: Dionisio David, NP - Appointments are:  Medication management: Monday to Thursday 8:00 AM to 4:00 PM Milinda Pointer, MD - Appointments are:  Medication management: Monday and Wednesday 8:00 AM to 4:00 PM Procedure day: Tuesday and Thursday 7:30 AM to 4:00 PM Gillis Santa, MD - Appointments are:  Medication management: Tuesday and Thursday 8:00 AM to 4:00 PM Procedure day: Monday and Wednesday 7:30 AM to 4:00 PM (Last update: 01/27/2018) ____________________________________________________________________________________________   ____________________________________________________________________________________________  CANNABIDIOL (AKA: CBD Oil or Pills)  Applies to: All patients receiving prescriptions of controlled substances (written and/or electronic).  General Information: Cannabidiol (CBD) was discovered in 77. It is one of some 113 identified cannabinoids in cannabis (Marijuana) plants, accounting for up to 40% of the plant's extract. As of 2018, preliminary clinical research on cannabidiol included studies of anxiety, cognition, movement disorders, and pain.  Cannabidiol is consummed in multiple ways, including inhalation of cannabis smoke or vapor, as an aerosol spray into the cheek, and by mouth. It may be supplied as CBD oil containing CBD as the active ingredient (no added tetrahydrocannabinol (THC) or terpenes), a full-plant CBD-dominant hemp extract oil, capsules, dried cannabis, or as a liquid solution. CBD is thought not have the same psychoactivity as THC, and may affect the actions of THC. Studies suggest that CBD may interact with different biological targets, including cannabinoid receptors and other neurotransmitter receptors. As of 2018 the mechanism of action for its biological effects has not been determined.  In the [place], cannabidiol has a limited approval by the Food and Drug Administration (FDA) for treatment of only  two types of epilepsy disorders. The side effects of long-term  use of the drug include somnolence, decreased appetite, diarrhea, fatigue, malaise, weakness, sleeping problems, and others.  CBD remains a Schedule I drug prohibited for any use.  Legality: Some manufacturers ship CBD products nationally, an illegal action which the FDA has not enforced in 2018, with CBD remaining the subject of an FDA investigational new drug evaluation, and is not considered legal as a dietary supplement or food ingredient as of December 2018. Federal illegality has made it difficult historically to conduct research on CBD. CBD is openly sold in head shops and health food stores in some states where such sales have not been explicitly legalized.  Warning: Because it is not FDA approved for general use or treatment of pain, it is not required to undergo the same manufacturing controls as prescription drugs.  This means that the available cannabidiol (CBD) may be contaminated with THC.  If this is the case, it will trigger a positive urine drug screen (UDS) test for cannabinoids (Marijuana).  Because a positive UDS for illicit substances is a violation of our medication agreement, your opioid analgesics (pain medicine) may be permanently discontinued. (Last update: 02/17/2018) ____________________________________________________________________________________________   ____________________________________________________________________________________________  Blood Thinners  Recommended Time Interval Before and After Neuraxial Block or Catheter Removal  Drug (Generic) Brand Name Time Before Time After Comments  Abciximab Reopro 15 days 2 hours   Alteplase Activase 10 days 10 days   Apixaban Eliquis 3 days 6 hours   Aspirin > 325 mg Goody Powders/Excedrin 11 days  (Usually not stopped)  Aspirin ? 81 mg  7 days  (Usually not stopped)  Cholesterol Medication Lipitor 4 days    Cilostazol Pletal 3 days 5 hours    Clopidogrel Plavix 7-10 days 2 hours   Dabigatran Pradaxa 5 days 6 hours   Delteparin Fragmin 24 hours 4 hours   Dipyridamole + ASA Aggrenox 11days 2 hours   Enoxaparin  Lovenox 24 hours 4 hours   Eptifibatide Integrillin 8 hours 2 hours   Fish oil  4 days    Fondaparinux  Arixtra 72 hours 12 hours   Garlic supplements  7 days    Ginkgo biloba  36 hours    Ginseng  24 hours    Heparin (IV)  4 hours 2 hours   Heparin ([place])  12 hours 2 hours   Hydroxychloroquine Plaquenil 11 days    LMW Heparin  24 hours    LMWH  24 hours    NSAIDs  3 days  (Usually not stopped)  Prasugrel Effient 7-10 days 6 hours   Reteplase Retavase 10 days 10 days   Rivaroxaban Xarelto 3 days 6 hours   Streptokinase Streptase 10 days 10 days   Tenecteplase TNKase 10 days 10 days   Thrombolytics  10 days  10 days Avoid x 10 days after inj.  Ticagrelor Brilinta 5-7 days 6 hours   Ticlodipine Ticlid 10-14 days 2 hours   Tinzaparin Innohep 24 hours 4 hours   Tirofiban Aggrastat 8 hours 2 hours   Vitamin E  4 days    Warfarin Coumadin 5 days 2 hours   ____________________________________________________________________________________________  ____________________________________________________________________________________________  General Risks and Possible Complications  Patient Responsibilities: It is important that you read this as it is part of your informed consent. It is our duty to inform you of the risks and possible complications associated with treatments offered to you. It is your responsibility as a patient to read this and to ask questions about anything that is not clear or that  you believe was not covered in this document.  Patient's Rights: You have the right to refuse treatment. You also have the right to change your mind, even after initially having agreed to have the treatment done. However, under this last option, if you wait until the last second to change your mind, you may be charged for  the materials used up to that point.  Introduction: Medicine is not an Chief Strategy Officer. Everything in Medicine, including the lack of treatment(s), carries the potential for danger, harm, or loss (which is by definition: Risk). In Medicine, a complication is a secondary problem, condition, or disease that can aggravate an already existing one. All treatments carry the risk of possible complications. The fact that a side effects or complications occurs, does not imply that the treatment was conducted incorrectly. It must be clearly understood that these can happen even when everything is done following the highest safety standards.  No treatment: You can choose not to proceed with the proposed treatment alternative. The "PRO(s)" would include: avoiding the risk of complications associated with the therapy. The "CON(s)" would include: not getting any of the treatment benefits. These benefits fall under one of three categories: diagnostic; therapeutic; and/or palliative. Diagnostic benefits include: getting information which can ultimately lead to improvement of the disease or symptom(s). Therapeutic benefits are those associated with the successful treatment of the disease. Finally, palliative benefits are those related to the decrease of the primary symptoms, without necessarily curing the condition (example: decreasing the pain from a flare-up of a chronic condition, such as incurable terminal cancer).  General Risks and Complications: These are associated to most interventional treatments. They can occur alone, or in combination. They fall under one of the following six (6) categories: no benefit or worsening of symptoms; bleeding; infection; nerve damage; allergic reactions; and/or death. 1. No benefits or worsening of symptoms: In Medicine there are no guarantees, only probabilities. No healthcare provider can ever guarantee that a medical treatment will work, they can only state the probability that it may.  Furthermore, there is always the possibility that the condition may worsen, either directly, or indirectly, as a consequence of the treatment. 2. Bleeding: This is more common if the patient is taking a blood thinner, either prescription or over the counter (example: Goody Powders, Fish oil, Aspirin, Garlic, etc.), or if suffering a condition associated with impaired coagulation (example: Hemophilia, cirrhosis of the liver, low platelet counts, etc.). However, even if you do not have one on these, it can still happen. If you have any of these conditions, or take one of these drugs, make sure to notify your treating physician. 3. Infection: This is more common in patients with a compromised immune system, either due to disease (example: diabetes, cancer, human immunodeficiency virus [HIV], etc.), or due to medications or treatments (example: therapies used to treat cancer and rheumatological diseases). However, even if you do not have one on these, it can still happen. If you have any of these conditions, or take one of these drugs, make sure to notify your treating physician. 4. Nerve Damage: This is more common when the treatment is an invasive one, but it can also happen with the use of medications, such as those used in the treatment of cancer. The damage can occur to small secondary nerves, or to large primary ones, such as those in the spinal cord and brain. This damage may be temporary or permanent and it may lead to impairments that can range from temporary numbness  to permanent paralysis and/or brain death. 5. Allergic Reactions: Any time a substance or material comes in contact with our body, there is the possibility of an allergic reaction. These can range from a mild skin rash (contact dermatitis) to a severe systemic reaction (anaphylactic reaction), which can result in death. 6. Death: In general, any medical intervention can result in death, most of the time due to an unforeseen  complication. ____________________________________________________________________________________________

## 2018-11-15 ENCOUNTER — Encounter: Payer: Self-pay | Admitting: *Deleted

## 2018-11-15 ENCOUNTER — Telehealth: Payer: Self-pay | Admitting: *Deleted

## 2018-11-15 DIAGNOSIS — Z952 Presence of prosthetic heart valve: Secondary | ICD-10-CM

## 2018-11-15 NOTE — Telephone Encounter (Signed)
Called to check on pt.  She was seen by pain clinic and referred for bariatric surgery.   Left message.

## 2018-11-15 NOTE — Progress Notes (Signed)
Called to check on pt.  She was seen by pain clinic and referred for bariatric surgery.   Left message.   Last visit was 11/12

## 2018-11-16 ENCOUNTER — Encounter: Payer: Self-pay | Admitting: Family

## 2018-11-16 ENCOUNTER — Other Ambulatory Visit: Payer: Self-pay | Admitting: Family

## 2018-11-16 ENCOUNTER — Ambulatory Visit: Payer: Medicaid Other | Admitting: Family

## 2018-11-16 ENCOUNTER — Ambulatory Visit
Admission: RE | Admit: 2018-11-16 | Discharge: 2018-11-16 | Disposition: A | Discharge status: Home / Self Care | Payer: Medicaid Other | Source: Ambulatory Visit | Attending: Family | Admitting: Family

## 2018-11-16 VITALS — BP 117/77 | HR 91 | Resp 18 | Ht 64.0 in | Wt 268.4 lb

## 2018-11-16 DIAGNOSIS — Z7901 Long term (current) use of anticoagulants: Secondary | ICD-10-CM

## 2018-11-16 DIAGNOSIS — Z886 Allergy status to analgesic agent status: Secondary | ICD-10-CM | POA: Insufficient documentation

## 2018-11-16 DIAGNOSIS — I5033 Acute on chronic diastolic (congestive) heart failure: Secondary | ICD-10-CM

## 2018-11-16 DIAGNOSIS — F17210 Nicotine dependence, cigarettes, uncomplicated: Secondary | ICD-10-CM | POA: Insufficient documentation

## 2018-11-16 DIAGNOSIS — I4891 Unspecified atrial fibrillation: Secondary | ICD-10-CM | POA: Insufficient documentation

## 2018-11-16 DIAGNOSIS — I2510 Atherosclerotic heart disease of native coronary artery without angina pectoris: Secondary | ICD-10-CM

## 2018-11-16 DIAGNOSIS — K219 Gastro-esophageal reflux disease without esophagitis: Secondary | ICD-10-CM

## 2018-11-16 DIAGNOSIS — I272 Pulmonary hypertension, unspecified: Secondary | ICD-10-CM

## 2018-11-16 DIAGNOSIS — I110 Hypertensive heart disease with heart failure: Secondary | ICD-10-CM

## 2018-11-16 DIAGNOSIS — Z885 Allergy status to narcotic agent status: Secondary | ICD-10-CM | POA: Insufficient documentation

## 2018-11-16 DIAGNOSIS — F419 Anxiety disorder, unspecified: Secondary | ICD-10-CM | POA: Insufficient documentation

## 2018-11-16 DIAGNOSIS — I5031 Acute diastolic (congestive) heart failure: Secondary | ICD-10-CM

## 2018-11-16 DIAGNOSIS — J449 Chronic obstructive pulmonary disease, unspecified: Secondary | ICD-10-CM

## 2018-11-16 DIAGNOSIS — Z888 Allergy status to other drugs, medicaments and biological substances status: Secondary | ICD-10-CM

## 2018-11-16 DIAGNOSIS — Z952 Presence of prosthetic heart valve: Secondary | ICD-10-CM | POA: Insufficient documentation

## 2018-11-16 DIAGNOSIS — G4733 Obstructive sleep apnea (adult) (pediatric): Secondary | ICD-10-CM

## 2018-11-16 DIAGNOSIS — I480 Paroxysmal atrial fibrillation: Secondary | ICD-10-CM

## 2018-11-16 DIAGNOSIS — Z79899 Other long term (current) drug therapy: Secondary | ICD-10-CM | POA: Insufficient documentation

## 2018-11-16 DIAGNOSIS — I10 Essential (primary) hypertension: Secondary | ICD-10-CM

## 2018-11-16 LAB — BASIC METABOLIC PANEL
Anion gap: 10 (ref 5–15)
BUN: 15 mg/dL (ref 6–20)
CALCIUM: 9 mg/dL (ref 8.9–10.3)
CO2: 29 mmol/L (ref 22–32)
Chloride: 103 mmol/L (ref 98–111)
Creatinine, Ser: 0.6 mg/dL (ref 0.44–1.00)
GFR calc Af Amer: >60 mL/min (ref >60)
Glucose, Bld: 128 mg/dL — ABNORMAL HIGH (ref 70–99)
Potassium: 3.4 mmol/L — ABNORMAL LOW (ref 3.5–5.1)
Sodium: 142 mmol/L (ref 135–145)

## 2018-11-16 LAB — BRAIN NATRIURETIC PEPTIDE: B Natriuretic Peptide: 275 pg/mL — ABNORMAL HIGH (ref 0.0–100.0)

## 2018-11-16 MED ORDER — SODIUM CHLORIDE FLUSH 0.9 % IV SOLN
INTRAVENOUS | Status: AC
  Filled 2018-11-16: qty 10

## 2018-11-16 MED ORDER — FUROSEMIDE 10 MG/ML IJ SOLN
INTRAMUSCULAR | Status: AC
  Administered 2018-11-16: 80 mg via INTRAVENOUS
  Filled 2018-11-16: qty 8

## 2018-11-16 MED ORDER — POTASSIUM CHLORIDE CRYS ER 20 MEQ PO TBCR
40.0000 meq | EXTENDED_RELEASE_TABLET | Freq: Once | ORAL | Status: AC
  Administered 2018-11-16: 40 meq via ORAL

## 2018-11-16 MED ORDER — POTASSIUM CHLORIDE CRYS ER 20 MEQ PO TBCR
EXTENDED_RELEASE_TABLET | ORAL | Status: AC
  Administered 2018-11-16: 40 meq via ORAL
  Filled 2018-11-16: qty 2

## 2018-11-16 MED ORDER — FUROSEMIDE 10 MG/ML IJ SOLN
80.0000 mg | Freq: Once | INTRAMUSCULAR | Status: AC
  Administered 2018-11-16: 80 mg via INTRAVENOUS

## 2018-11-16 NOTE — Patient Instructions (Signed)
Continue weighing daily and call for an overnight weight gain of > 2 pounds or a weekly weight gain of >5 pounds. 

## 2018-11-16 NOTE — Progress Notes (Signed)
Patient ID: Chelsea Ramsey, female    DOB: 1966-10-29, 52 y.o.   MRN: [id number]  HPI  Chelsea Ramsey is a 52 y/o female with a history of CAD (leaky valve), HTN, pulmonary HTN, COPD, GERD, obstructive sleep apnea, previous tobacco use and chronic heart failure.   Echo report from 08/11/18 reviewed and showed an EF of 40-45% which has declined from previous echo. + moderate TR and mild/moderately elevated PA pressure.  Echo report from 07/20/18 reviewed and showed an EF of 50-55% along with mild MR and an elevated PA pressure of 50 mm Hg.   Admitted 11/04/18 due to atypical chest pain. Cardiology consult obtained. Medications adjusted and she was discharged after 2 days. Was in the ED 10/17/18 but left without being seen. Was in the ED 10/11/18 due to chest pain where she was treated and released. Admitted 08/04/18 due to dizziness and gait instability. Neurology consulted. CT/brain MRI were negative. Occipital nerve injection given for headache. Medications adjusted and discharged after 6 days. Admitted 07/17/18 due to acute on chronic heart failure. Initially given IV diuretics and then transitioned to oral diuretics. Cardiology consult obtained. Discharged after 4 days. Admitted 06/20/18 due to having her mitral valve replaced. Dobutamine and epinephrine were weaned and she was extubated. Had atrial fibrillation exacerbation which involved medication adjustments. Had cardioversion done 06/30/18 with resultant short time in NSR but then reverted back to atrial fibrillation. Unable to tolerate amiodarone due to nausea and vomiting. Discharged after 22 days.   She presents today for a follow-up visit with a chief complaint of moderate shortness of breath upon minimal exertion. She says that this has been present for several months although has worsened over the last week. She has associated fatigue, blurry vision, abdominal distention, nausea, difficulty sleeping, anxiety, weight gain and light headedness along  with this. She denies any cough, pedal edema or palpitations.   Past Medical History:  Diagnosis Date  . Allergy    seasonal  . Anxiety   . Arthritis    Right Knee  . Asthma   . CHF (congestive heart failure) ([place])   . COPD (chronic obstructive pulmonary disease) ([place])   . Coronary artery disease    Leaky heart valve  . Fibromyalgia   . GERD (gastroesophageal reflux disease)   . Hypertension   . Pneumonia   . PUD (peptic ulcer disease)   . Pulmonary HTN ([place])   . Rheumatic fever/heart disease   . Sleep apnea    Past Surgical History:  Procedure Laterality Date  . ADENOIDECTOMY    . ESOPHAGOGASTRODUODENOSCOPY (EGD) WITH PROPOFOL N/A 05/25/2018   Procedure: ESOPHAGOGASTRODUODENOSCOPY (EGD) WITH PROPOFOL;  Surgeon: Lucilla Lame, MD;  Location[name] ENDOSCOPY;  Service: Endoscopy;  Laterality: N/A;  . MITRAL VALVE REPLACEMENT    . MULTIPLE TOOTH EXTRACTIONS    . TONSILLECTOMY    . TOTAL KNEE ARTHROPLASTY Right 09/04/2016   Procedure: TOTAL KNEE ARTHROPLASTY; with lateral release;  Surgeon: Earlie Server, MD;  Location: [place];  Service: Orthopedics;  Laterality: Right;   Family History  Problem Relation Age of Onset  . COPD Mother   . Cancer Mother        Bone  . Asthma Mother   . Congestive Heart Failure Father    Social History   Tobacco Use  . Smoking status: Current Every Day Smoker    Packs/day: 0.50    Types: Cigarettes  . Smokeless tobacco: Never Used  Substance Use Topics  . Alcohol use: Not  Currently    Alcohol/week: 3.0 standard drinks    Types: 3 Cans of beer per week    Comment: 16 oz per week   Allergies  Allergen Reactions  . Amiodarone Nausea And Vomiting  . Aspirin Swelling  . Flexeril [Cyclobenzaprine] Swelling  . Trazamine [Trazodone & Diet Manage Prod] Nausea And Vomiting  . Codeine Rash  . Tramadol Rash   Prior to Admission medications   Medication Sig Start Date End Date Taking? Authorizing Provider  bisacodyl (DULCOLAX) 5 MG EC tablet  Take 2 tablets (10 mg total) by mouth daily as needed for moderate constipation. 07/21/18  Yes Epifanio Lesches, MD  budesonide-formoterol (SYMBICORT) 160-4.5 MCG/ACT inhaler Inhale 2 puffs into the lungs 2 (two) times daily. 02/26/16  Yes [provider]  bumetanide (BUMEX) 2 MG tablet Take 2 tablets (4 mg total) by mouth daily. 07/22/18  Yes Epifanio Lesches, MD  butalbital-acetaminophen-caffeine (FIORICET, ESGIC) [id number] MG tablet Take 1-2 tablets by mouth every 6 (six) hours as needed for headache. 10/11/18 10/11/19 Yes Veronese, [place], MD  Calcium Carbonate-Vit D-Min [facility] CALCIUM 1200) 1200-1000 MG-UNIT CHEW Chew 1,200 mg by mouth daily with breakfast. Take in combination with vitamin D and magnesium. 11/14/18 05/13/19 Yes Milinda Pointer, MD  Cholecalciferol (VITAMIN D3) 125 MCG (5000 UT) CAPS Take 1 capsule (5,000 Units total) by mouth daily with breakfast. Take along with calcium and magnesium. 11/14/18 05/13/19 Yes Milinda Pointer, MD  ergocalciferol (VITAMIN D2) 1.25 MG (50000 UT) capsule Take 1 capsule (50,000 Units total) by mouth 2 (two) times a week. X 6 weeks. 11/14/18 12/26/18 Yes Milinda Pointer, MD  gabapentin (NEURONTIN) 300 MG capsule Take 600 mg by mouth 3 (three) times daily.  07/22/15  Yes [provider]  ipratropium-albuterol (DUONEB) 0.5-2.5 (3) MG/3ML SOLN Inhale 3 mLs into the lungs 2 (two) times daily as needed (respiratory).  02/26/16 12/20/18 Yes [provider]  lactulose (CHRONULAC) 10 GM/15ML solution Take 45 mLs (30 g total) by mouth 2 (two) times daily as needed for mild constipation. 07/21/18  Yes Epifanio Lesches, MD  Magnesium 500 MG CAPS Take 1 capsule (500 mg total) by mouth 2 (two) times daily at 8 am and 10 pm. 11/14/18 05/13/19 Yes Milinda Pointer, MD  magnesium oxide (MAG-OX) 400 MG tablet Take 400 mg by mouth 2 (two) times daily. 07/11/18 07/11/19 Yes [provider]  metoprolol tartrate (LOPRESSOR) 100 MG  tablet Take 1 tablet (100 mg total) by mouth 2 (two) times daily. Patient taking differently: Take 200 mg by mouth 2 (two) times daily.  11/05/18  Yes Mody, Ulice Bold, MD  nicotine (NICODERM CQ - DOSED IN MG/24 HR) 7 mg/24hr patch Place 1 patch (7 mg total) onto the skin daily. 11/06/18  Yes Mody, Ulice Bold, MD  nitroGLYCERIN (NITROSTAT) 0.4 MG SL tablet Place 1 tablet (0.4 mg total) under the tongue every 5 (five) minutes as needed for chest pain. 11/05/18  Yes Mody, Ulice Bold, MD  omeprazole (PRILOSEC) 40 MG capsule Take 40 mg by mouth 2 (two) times daily.   Yes [provider]  Oxycodone HCl 10 MG TABS Take 0.5-1 tablets (5-10 mg total) by mouth 2 (two) times daily as needed for up to 7 days. Must last 7 days. 11/14/18 11/21/18 Yes Milinda Pointer, MD  Oxycodone HCl 10 MG TABS Take 0.5-1 tablets (5-10 mg total) by mouth 2 (two) times daily as needed. Must last 30 days. MAX.: 2/day 11/21/18 12/21/18 Yes Milinda Pointer, MD  promethazine (PHENERGAN) 12.5 MG tablet Take 1 tablet (12.5  mg total) by mouth every 6 (six) hours as needed for nausea or vomiting. 11/06/18  Yes Mody, Ulice Bold, MD  rivaroxaban (XARELTO) 20 MG TABS tablet Take 20 mg by mouth every evening.  07/11/18 07/11/19 Yes [provider]  tiZANidine (ZANAFLEX) 4 MG tablet Take 4 mg by mouth at bedtime.  08/10/18  Yes [provider]  topiramate (TOPAMAX) 25 MG tablet Take 25 mg by mouth 2 (two) times daily.  08/10/18  Yes [provider]    Review of Systems  Constitutional: Positive for fatigue (easily). Negative for appetite change.  HENT: Negative for congestion, postnasal drip and sore throat.   Eyes: Positive for visual disturbance (blurry vision at times).  Respiratory: Positive for shortness of breath. Negative for cough.   Cardiovascular: Positive for chest pain (pressure right upper chest). Negative for palpitations and leg swelling.  Gastrointestinal: Positive for abdominal distention and nausea. Negative  for abdominal pain.  Endocrine: Negative.   Genitourinary: Negative.   Musculoskeletal: Negative for back pain and neck pain.  Skin: Negative.   Allergic/Immunologic: Negative.   Neurological: Positive for light-headedness. Negative for dizziness.  Hematological: Negative for adenopathy. Does not bruise/bleed easily.  Psychiatric/Behavioral: Positive for sleep disturbance (due to pain). Negative for dysphoric mood. The patient is nervous/anxious.    Vitals:   11/16/18 1247  BP: 117/77  Pulse: 91  Resp: 18  SpO2: 100%  Weight: 268 lb 6 oz (121.7 kg)  Height: 5\' 4"  (1.626 m)   Wt Readings from Last 3 Encounters:  11/16/18 268 lb 6 oz (121.7 kg)  11/14/18 267 lb (121.1 kg)  11/04/18 231 lb 9.6 oz (105.1 kg)   Lab Results  Component Value Date   CREATININE 0.47 11/04/2018   CREATININE 0.37 (L) 10/11/2018   CREATININE 0.62 10/10/2018    Physical Exam Vitals signs and nursing note reviewed.  Constitutional:      Appearance: She is well-developed.  HENT:     Head: Normocephalic and atraumatic.  Neck:     Musculoskeletal: Normal range of motion and neck supple.     Vascular: No JVD.  Cardiovascular:     Rate and Rhythm: Normal rate and regular rhythm.  Pulmonary:     Effort: Pulmonary effort is normal. No respiratory distress.     Breath sounds: No wheezing or rales.  Abdominal:     General: There is distension.     Palpations: Abdomen is soft.  Musculoskeletal:     Right lower leg: She exhibits no tenderness. No edema.     Left lower leg: She exhibits no tenderness. No edema.  Skin:    General: Skin is warm and dry.  Neurological:     Mental Status: She is alert and oriented to person, place, and time.  Psychiatric:        Behavior: Behavior normal.    Assessment & Plan:  1: Acute on Chronic heart failure with preserved ejection fraction- - NYHA class III - weighing daily and she was instructed to call for an overnight weight gain of >2 pounds or a weekly  weight gain of >5 pounds - she says that she's gained 10 pounds in the last week by her home scale - will give 80mg  IV lasix/ 45meq PO potassium today - BMP/BNP to be drawn today - weight up 1 pound from last visit here 3 1/2 months ago - not adding salt and has been reading food labels. Reviewed the importance of closely following a 2000mg  sodium diet  - mostly  drinking water during the day and an occasional soda - currently unable to participate in cardiac rehab due to symptoms - discussed changing her diuretic to torsemide - BNP 10/11/18 was 127.0 - saw cardiology @ [facility] 09/14/18 - will discuss paramedicine program with patient.   2: HTN- - BP looks good today  - saw PCP @ [facility] 10/17/18 - BMP 11/04/18 reviewed and showed sodium 140, potassium 4.0, creatinine 0.47 and GFR >60  3: Atrial fibrillation- - had mitral valve replaced July 2019 - cardioverted 06/30/18  Patient did not bring her medications nor a list. Each medication was verbally reviewed with the patient and she was encouraged to bring the bottles to every visit to confirm accuracy of list.  Return tomorrow.

## 2018-11-17 ENCOUNTER — Ambulatory Visit: Payer: Medicaid Other | Admitting: Family

## 2018-11-17 ENCOUNTER — Emergency Department: Payer: Medicaid Other

## 2018-11-17 ENCOUNTER — Encounter: Payer: Self-pay | Admitting: Emergency Medicine

## 2018-11-17 ENCOUNTER — Other Ambulatory Visit: Payer: Self-pay

## 2018-11-17 ENCOUNTER — Inpatient Hospital Stay
Admission: EM | Admit: 2018-11-17 | Discharge: 2018-11-19 | DRG: 291 | Disposition: A | Discharge status: Home / Self Care | Payer: Medicaid Other | Attending: Family Medicine | Admitting: Family Medicine

## 2018-11-17 ENCOUNTER — Encounter: Payer: Self-pay | Admitting: Family

## 2018-11-17 VITALS — BP 89/60 | HR 63 | Resp 20 | Ht 64.0 in | Wt 267.1 lb

## 2018-11-17 DIAGNOSIS — M797 Fibromyalgia: Secondary | ICD-10-CM | POA: Diagnosis present

## 2018-11-17 DIAGNOSIS — Z825 Family history of asthma and other chronic lower respiratory diseases: Secondary | ICD-10-CM | POA: Diagnosis not present

## 2018-11-17 DIAGNOSIS — Z96651 Presence of right artificial knee joint: Secondary | ICD-10-CM | POA: Insufficient documentation

## 2018-11-17 DIAGNOSIS — Z8249 Family history of ischemic heart disease and other diseases of the circulatory system: Secondary | ICD-10-CM

## 2018-11-17 DIAGNOSIS — J9601 Acute respiratory failure with hypoxia: Secondary | ICD-10-CM

## 2018-11-17 DIAGNOSIS — K219 Gastro-esophageal reflux disease without esophagitis: Secondary | ICD-10-CM | POA: Insufficient documentation

## 2018-11-17 DIAGNOSIS — Z79899 Other long term (current) drug therapy: Secondary | ICD-10-CM

## 2018-11-17 DIAGNOSIS — Z7901 Long term (current) use of anticoagulants: Secondary | ICD-10-CM

## 2018-11-17 DIAGNOSIS — J302 Other seasonal allergic rhinitis: Secondary | ICD-10-CM | POA: Diagnosis present

## 2018-11-17 DIAGNOSIS — Z6841 Body Mass Index (BMI) 40.0 and over, adult: Secondary | ICD-10-CM

## 2018-11-17 DIAGNOSIS — R059 Cough, unspecified: Secondary | ICD-10-CM

## 2018-11-17 DIAGNOSIS — I2510 Atherosclerotic heart disease of native coronary artery without angina pectoris: Secondary | ICD-10-CM | POA: Insufficient documentation

## 2018-11-17 DIAGNOSIS — R079 Chest pain, unspecified: Secondary | ICD-10-CM

## 2018-11-17 DIAGNOSIS — E876 Hypokalemia: Secondary | ICD-10-CM | POA: Diagnosis present

## 2018-11-17 DIAGNOSIS — Z716 Tobacco abuse counseling: Secondary | ICD-10-CM

## 2018-11-17 DIAGNOSIS — J441 Chronic obstructive pulmonary disease with (acute) exacerbation: Secondary | ICD-10-CM | POA: Diagnosis present

## 2018-11-17 DIAGNOSIS — G4730 Sleep apnea, unspecified: Secondary | ICD-10-CM

## 2018-11-17 DIAGNOSIS — Z885 Allergy status to narcotic agent status: Secondary | ICD-10-CM

## 2018-11-17 DIAGNOSIS — Z8711 Personal history of peptic ulcer disease: Secondary | ICD-10-CM | POA: Insufficient documentation

## 2018-11-17 DIAGNOSIS — I4891 Unspecified atrial fibrillation: Secondary | ICD-10-CM

## 2018-11-17 DIAGNOSIS — J440 Chronic obstructive pulmonary disease with acute lower respiratory infection: Secondary | ICD-10-CM | POA: Diagnosis present

## 2018-11-17 DIAGNOSIS — G4733 Obstructive sleep apnea (adult) (pediatric): Secondary | ICD-10-CM

## 2018-11-17 DIAGNOSIS — Z952 Presence of prosthetic heart valve: Secondary | ICD-10-CM

## 2018-11-17 DIAGNOSIS — I272 Pulmonary hypertension, unspecified: Secondary | ICD-10-CM | POA: Insufficient documentation

## 2018-11-17 DIAGNOSIS — F419 Anxiety disorder, unspecified: Secondary | ICD-10-CM

## 2018-11-17 DIAGNOSIS — I509 Heart failure, unspecified: Secondary | ICD-10-CM

## 2018-11-17 DIAGNOSIS — I480 Paroxysmal atrial fibrillation: Secondary | ICD-10-CM

## 2018-11-17 DIAGNOSIS — Z7951 Long term (current) use of inhaled steroids: Secondary | ICD-10-CM | POA: Diagnosis not present

## 2018-11-17 DIAGNOSIS — I5031 Acute diastolic (congestive) heart failure: Secondary | ICD-10-CM

## 2018-11-17 DIAGNOSIS — E782 Mixed hyperlipidemia: Secondary | ICD-10-CM | POA: Diagnosis present

## 2018-11-17 DIAGNOSIS — F17210 Nicotine dependence, cigarettes, uncomplicated: Secondary | ICD-10-CM

## 2018-11-17 DIAGNOSIS — Z886 Allergy status to analgesic agent status: Secondary | ICD-10-CM

## 2018-11-17 DIAGNOSIS — I110 Hypertensive heart disease with heart failure: Principal | ICD-10-CM | POA: Diagnosis present

## 2018-11-17 DIAGNOSIS — M1711 Unilateral primary osteoarthritis, right knee: Secondary | ICD-10-CM

## 2018-11-17 DIAGNOSIS — I5033 Acute on chronic diastolic (congestive) heart failure: Secondary | ICD-10-CM | POA: Diagnosis present

## 2018-11-17 DIAGNOSIS — J9621 Acute and chronic respiratory failure with hypoxia: Secondary | ICD-10-CM | POA: Diagnosis present

## 2018-11-17 DIAGNOSIS — Z888 Allergy status to other drugs, medicaments and biological substances status: Secondary | ICD-10-CM

## 2018-11-17 DIAGNOSIS — R05 Cough: Secondary | ICD-10-CM

## 2018-11-17 DIAGNOSIS — E559 Vitamin D deficiency, unspecified: Secondary | ICD-10-CM | POA: Diagnosis present

## 2018-11-17 DIAGNOSIS — J209 Acute bronchitis, unspecified: Secondary | ICD-10-CM | POA: Diagnosis present

## 2018-11-17 DIAGNOSIS — J449 Chronic obstructive pulmonary disease, unspecified: Secondary | ICD-10-CM | POA: Insufficient documentation

## 2018-11-17 DIAGNOSIS — I10 Essential (primary) hypertension: Secondary | ICD-10-CM

## 2018-11-17 LAB — BASIC METABOLIC PANEL
Anion gap: 9 (ref 5–15)
BUN: 16 mg/dL (ref 6–20)
CO2: 30 mmol/L (ref 22–32)
Calcium: 9.1 mg/dL (ref 8.9–10.3)
Chloride: 101 mmol/L (ref 98–111)
Creatinine, Ser: 0.64 mg/dL (ref 0.44–1.00)
GFR calc Af Amer: >60 mL/min (ref >60)
Glucose, Bld: 97 mg/dL (ref 70–99)
Potassium: 3.9 mmol/L (ref 3.5–5.1)
Sodium: 140 mmol/L (ref 135–145)

## 2018-11-17 LAB — PROTIME-INR
INR: 1.24
Prothrombin Time: 15.5 seconds — ABNORMAL HIGH (ref 11.4–15.2)

## 2018-11-17 LAB — CBC
HCT: 38.3 % (ref 36.0–46.0)
Hemoglobin: 11.8 g/dL — ABNORMAL LOW (ref 12.0–15.0)
MCH: 25 pg — AB (ref 26.0–34.0)
MCHC: 30.8 g/dL (ref 30.0–36.0)
MCV: 81.1 fL (ref 80.0–100.0)
Platelets: 285 10*3/uL (ref 150–400)
RBC: 4.72 MIL/uL (ref 3.87–5.11)
RDW: 16.7 % — AB (ref 11.5–15.5)
WBC: 9.1 10*3/uL (ref 4.0–10.5)
nRBC: 0 % (ref 0.0–0.2)

## 2018-11-17 LAB — INFLUENZA PANEL BY PCR (TYPE A & B)
Influenza A By PCR: NEGATIVE
Influenza B By PCR: NEGATIVE

## 2018-11-17 LAB — TROPONIN I: Troponin I: <0.03 ng/mL (ref <0.03)

## 2018-11-17 LAB — BRAIN NATRIURETIC PEPTIDE: B Natriuretic Peptide: 203 pg/mL — ABNORMAL HIGH (ref 0.0–100.0)

## 2018-11-17 MED ORDER — SODIUM CHLORIDE 0.9 % IV SOLN: 250.0000 mL | INTRAVENOUS | Status: DC | PRN

## 2018-11-17 MED ORDER — KETOROLAC TROMETHAMINE 30 MG/ML IJ SOLN
30.0000 mg | Freq: Once | INTRAMUSCULAR | Status: AC
  Administered 2018-11-17: 30 mg via INTRAVENOUS

## 2018-11-17 MED ORDER — RIVAROXABAN 20 MG PO TABS
20.0000 mg | ORAL_TABLET | Freq: Every evening | ORAL | Status: DC
  Administered 2018-11-17 – 2018-11-18 (×2): 20 mg via ORAL
  Filled 2018-11-17 (×2): qty 1

## 2018-11-17 MED ORDER — FENTANYL CITRATE (PF) 100 MCG/2ML IJ SOLN
100.0000 ug | Freq: Once | INTRAMUSCULAR | Status: AC
  Administered 2018-11-17: 100 ug via INTRAVENOUS
  Filled 2018-11-17: qty 2

## 2018-11-17 MED ORDER — KETOROLAC TROMETHAMINE 30 MG/ML IJ SOLN
INTRAMUSCULAR | Status: AC
  Filled 2018-11-17: qty 1

## 2018-11-17 MED ORDER — SODIUM CHLORIDE 0.9% FLUSH: 3.0000 mL | INTRAVENOUS | Status: DC | PRN

## 2018-11-17 MED ORDER — MAGNESIUM OXIDE 400 (241.3 MG) MG PO TABS: 400.0000 mg | ORAL_TABLET | Freq: Two times a day (BID) | ORAL | Status: DC

## 2018-11-17 MED ORDER — MAGNESIUM OXIDE 400 (241.3 MG) MG PO TABS
400.0000 mg | ORAL_TABLET | Freq: Two times a day (BID) | ORAL | Status: DC
  Administered 2018-11-17 – 2018-11-19 (×4): 400 mg via ORAL
  Filled 2018-11-17 (×3): qty 1

## 2018-11-17 MED ORDER — ALBUTEROL SULFATE (2.5 MG/3ML) 0.083% IN NEBU
2.5000 mg | INHALATION_SOLUTION | Freq: Once | RESPIRATORY_TRACT | Status: AC
  Administered 2018-11-17: 2.5 mg via RESPIRATORY_TRACT

## 2018-11-17 MED ORDER — ONDANSETRON HCL 4 MG/2ML IJ SOLN
4.0000 mg | Freq: Four times a day (QID) | INTRAMUSCULAR | Status: DC | PRN
  Administered 2018-11-18: 4 mg via INTRAVENOUS
  Filled 2018-11-17: qty 2

## 2018-11-17 MED ORDER — BUDESONIDE 0.25 MG/2ML IN SUSP
0.2500 mg | Freq: Two times a day (BID) | RESPIRATORY_TRACT | Status: DC
  Administered 2018-11-17: 0.25 mg via RESPIRATORY_TRACT
  Filled 2018-11-17: qty 2

## 2018-11-17 MED ORDER — METOPROLOL TARTRATE 50 MG PO TABS
100.0000 mg | ORAL_TABLET | Freq: Two times a day (BID) | ORAL | Status: DC
  Administered 2018-11-17 – 2018-11-19 (×4): 100 mg via ORAL
  Filled 2018-11-17 (×4): qty 2

## 2018-11-17 MED ORDER — CALCIUM CARBONATE-VITAMIN D 500-200 MG-UNIT PO TABS
2.0000 | ORAL_TABLET | Freq: Every day | ORAL | Status: DC
  Administered 2018-11-18 – 2018-11-19 (×2): 2 via ORAL
  Filled 2018-11-17 (×2): qty 2

## 2018-11-17 MED ORDER — FUROSEMIDE 10 MG/ML IJ SOLN
40.0000 mg | Freq: Two times a day (BID) | INTRAMUSCULAR | Status: DC
  Administered 2018-11-18 – 2018-11-19 (×3): 40 mg via INTRAVENOUS
  Filled 2018-11-17 (×3): qty 4

## 2018-11-17 MED ORDER — VITAMIN D (ERGOCALCIFEROL) 1.25 MG (50000 UNIT) PO CAPS
50000.0000 [IU] | ORAL_CAPSULE | ORAL | Status: DC
  Administered 2018-11-17: 50000 [IU] via ORAL
  Filled 2018-11-17: qty 1

## 2018-11-17 MED ORDER — ACETAMINOPHEN 325 MG PO TABS
650.0000 mg | ORAL_TABLET | Freq: Four times a day (QID) | ORAL | Status: DC | PRN
  Administered 2018-11-18 – 2018-11-19 (×2): 650 mg via ORAL
  Filled 2018-11-17 (×2): qty 2

## 2018-11-17 MED ORDER — DOXYCYCLINE HYCLATE 100 MG PO TABS
100.0000 mg | ORAL_TABLET | Freq: Two times a day (BID) | ORAL | Status: DC
  Administered 2018-11-17 – 2018-11-19 (×4): 100 mg via ORAL
  Filled 2018-11-17 (×4): qty 1

## 2018-11-17 MED ORDER — METHYLPREDNISOLONE SODIUM SUCC 125 MG IJ SOLR
125.0000 mg | Freq: Once | INTRAMUSCULAR | Status: AC
  Administered 2018-11-17: 125 mg via INTRAVENOUS
  Filled 2018-11-17: qty 2

## 2018-11-17 MED ORDER — ALBUTEROL SULFATE (2.5 MG/3ML) 0.083% IN NEBU
5.0000 mg | INHALATION_SOLUTION | Freq: Once | RESPIRATORY_TRACT | Status: AC
  Administered 2018-11-17: 5 mg via RESPIRATORY_TRACT
  Filled 2018-11-17: qty 6

## 2018-11-17 MED ORDER — ALBUTEROL SULFATE (2.5 MG/3ML) 0.083% IN NEBU
INHALATION_SOLUTION | RESPIRATORY_TRACT | Status: AC
  Filled 2018-11-17: qty 6

## 2018-11-17 MED ORDER — GABAPENTIN 300 MG PO CAPS
600.0000 mg | ORAL_CAPSULE | Freq: Three times a day (TID) | ORAL | Status: DC
  Administered 2018-11-17 – 2018-11-19 (×5): 600 mg via ORAL
  Filled 2018-11-17 (×5): qty 2

## 2018-11-17 MED ORDER — ADULT MULTIVITAMIN W/MINERALS CH
1.0000 | ORAL_TABLET | Freq: Every day | ORAL | Status: DC
  Administered 2018-11-17 – 2018-11-19 (×3): 1 via ORAL
  Filled 2018-11-17 (×3): qty 1

## 2018-11-17 MED ORDER — FUROSEMIDE 10 MG/ML IJ SOLN
80.0000 mg | Freq: Once | INTRAMUSCULAR | Status: AC
  Administered 2018-11-17: 80 mg via INTRAVENOUS
  Filled 2018-11-17: qty 8

## 2018-11-17 MED ORDER — BUTALBITAL-APAP-CAFFEINE 50-325-40 MG PO TABS
1.0000 | ORAL_TABLET | Freq: Four times a day (QID) | ORAL | Status: DC | PRN
  Administered 2018-11-18 (×3): 2 via ORAL
  Filled 2018-11-17 (×4): qty 2

## 2018-11-17 MED ORDER — MECLIZINE HCL 25 MG PO TABS
25.0000 mg | ORAL_TABLET | Freq: Three times a day (TID) | ORAL | Status: DC | PRN
  Filled 2018-11-17: qty 1

## 2018-11-17 MED ORDER — ONDANSETRON HCL 4 MG/2ML IJ SOLN
4.0000 mg | Freq: Once | INTRAMUSCULAR | Status: AC
  Administered 2018-11-17: 4 mg via INTRAVENOUS

## 2018-11-17 MED ORDER — GUAIFENESIN ER 600 MG PO TB12
600.0000 mg | ORAL_TABLET | Freq: Two times a day (BID) | ORAL | Status: DC
  Administered 2018-11-17 – 2018-11-19 (×4): 600 mg via ORAL
  Filled 2018-11-17 (×4): qty 1

## 2018-11-17 MED ORDER — PANTOPRAZOLE SODIUM 40 MG PO TBEC
40.0000 mg | DELAYED_RELEASE_TABLET | Freq: Every day | ORAL | Status: DC
  Administered 2018-11-17 – 2018-11-19 (×3): 40 mg via ORAL
  Filled 2018-11-17 (×3): qty 1

## 2018-11-17 MED ORDER — LACTULOSE 10 GM/15ML PO SOLN
30.0000 g | Freq: Two times a day (BID) | ORAL | Status: DC | PRN
  Filled 2018-11-17: qty 60

## 2018-11-17 MED ORDER — IPRATROPIUM-ALBUTEROL 0.5-2.5 (3) MG/3ML IN SOLN
3.0000 mL | RESPIRATORY_TRACT | Status: DC
  Administered 2018-11-17 – 2018-11-19 (×11): 3 mL via RESPIRATORY_TRACT
  Filled 2018-11-17 (×12): qty 3

## 2018-11-17 MED ORDER — TIZANIDINE HCL 4 MG PO TABS
4.0000 mg | ORAL_TABLET | Freq: Every day | ORAL | Status: DC
  Administered 2018-11-17 – 2018-11-18 (×2): 4 mg via ORAL
  Filled 2018-11-17 (×3): qty 1

## 2018-11-17 MED ORDER — SODIUM CHLORIDE 0.9% FLUSH
3.0000 mL | Freq: Two times a day (BID) | INTRAVENOUS | Status: DC
  Administered 2018-11-17 – 2018-11-19 (×4): 3 mL via INTRAVENOUS

## 2018-11-17 MED ORDER — ACETAMINOPHEN 650 MG RE SUPP: 650.0000 mg | Freq: Four times a day (QID) | RECTAL | Status: DC | PRN

## 2018-11-17 MED ORDER — VITAMIN D3 25 MCG (1000 UNIT) PO TABS
5000.0000 [IU] | ORAL_TABLET | Freq: Every day | ORAL | Status: DC
  Administered 2018-11-19: 5000 [IU] via ORAL
  Filled 2018-11-17 (×2): qty 5

## 2018-11-17 MED ORDER — NICOTINE 7 MG/24HR TD PT24
7.0000 mg | MEDICATED_PATCH | Freq: Every day | TRANSDERMAL | Status: DC
  Administered 2018-11-17 – 2018-11-19 (×3): 7 mg via TRANSDERMAL
  Filled 2018-11-17 (×3): qty 1

## 2018-11-17 MED ORDER — NITROGLYCERIN 0.4 MG SL SUBL: 0.4000 mg | SUBLINGUAL_TABLET | SUBLINGUAL | Status: DC | PRN

## 2018-11-17 MED ORDER — ONDANSETRON HCL 4 MG PO TABS: 4.0000 mg | ORAL_TABLET | Freq: Four times a day (QID) | ORAL | Status: DC | PRN

## 2018-11-17 MED ORDER — ONDANSETRON HCL 4 MG/2ML IJ SOLN
INTRAMUSCULAR | Status: AC
  Filled 2018-11-17: qty 2

## 2018-11-17 MED ORDER — TOPIRAMATE 25 MG PO TABS
25.0000 mg | ORAL_TABLET | Freq: Two times a day (BID) | ORAL | Status: DC
  Administered 2018-11-17 – 2018-11-18 (×2): 25 mg via ORAL
  Filled 2018-11-17 (×5): qty 1

## 2018-11-17 MED ORDER — OXYCODONE HCL 5 MG PO TABS
10.0000 mg | ORAL_TABLET | Freq: Three times a day (TID) | ORAL | Status: DC | PRN
  Administered 2018-11-17 – 2018-11-18 (×2): 10 mg via ORAL
  Filled 2018-11-17 (×2): qty 2

## 2018-11-17 MED ORDER — BISACODYL 5 MG PO TBEC
10.0000 mg | DELAYED_RELEASE_TABLET | Freq: Every day | ORAL | Status: DC | PRN
  Administered 2018-11-19: 10 mg via ORAL
  Filled 2018-11-17: qty 2

## 2018-11-17 MED ORDER — METHYLPREDNISOLONE SODIUM SUCC 125 MG IJ SOLR
60.0000 mg | Freq: Four times a day (QID) | INTRAMUSCULAR | Status: DC
  Administered 2018-11-17 – 2018-11-18 (×2): 60 mg via INTRAVENOUS
  Filled 2018-11-17 (×2): qty 2

## 2018-11-17 NOTE — ED Notes (Signed)
Attempted to call report, RN unavailable at this time.

## 2018-11-17 NOTE — H&P (Signed)
[place] [facility] [place] NAME: Chelsea Ramsey    MR#:  [id number]  DATE OF BIRTH:  [birth year]  DATE OF ADMISSION:  11/17/2018  PRIMARY CARE PHYSICIAN: Kathee Delton, MD   REQUESTING/REFERRING PHYSICIAN: Arta Silence, MD  CHIEF COMPLAINT:   Chief Complaint  Patient presents with  . Shortness of Breath  . Hypotension  . Chest Pain    HISTORY OF PRESENT ILLNESS: Chelsea Ramsey  is a 52 y.o. female with a known history of chronic diastolic CHF, COPD, and persistent nicotine abuse who is presenting to the hospital with complaint of shortness of breath cough congestion and wheezing.  Patient states that she started getting short of breath 2 days ago and has progressively gotten worse.  When she went to the congestive heart failure clinic she was noted to have 10 pound weight gain.  She also has been wheezing and having dry cough.  Chest x-ray suggestive of CHF as well as concern for COPD exasperation. PAST MEDICAL HISTORY:   Past Medical History:  Diagnosis Date  . Allergy    seasonal  . Anxiety   . Arthritis    Right Knee  . Asthma   . CHF (congestive heart failure) ([name])   . COPD (chronic obstructive pulmonary disease) ([place])   . Coronary artery disease    Leaky heart valve  . Fibromyalgia   . GERD (gastroesophageal reflux disease)   . Hypertension   . Pneumonia   . PUD (peptic ulcer disease)   . Pulmonary HTN ([place])   . Rheumatic fever/heart disease   . Sleep apnea     PAST SURGICAL HISTORY:  Past Surgical History:  Procedure Laterality Date  . ADENOIDECTOMY    . ESOPHAGOGASTRODUODENOSCOPY (EGD) WITH PROPOFOL N/A 05/25/2018   Procedure: ESOPHAGOGASTRODUODENOSCOPY (EGD) WITH PROPOFOL;  Surgeon: Lucilla Lame, MD;  Location: [facility] ENDOSCOPY;  Service: Endoscopy;  Laterality: N/A;  . MITRAL VALVE REPLACEMENT    . MULTIPLE TOOTH EXTRACTIONS    . TONSILLECTOMY    . TOTAL KNEE ARTHROPLASTY Right 09/04/2016   Procedure: TOTAL  KNEE ARTHROPLASTY; with lateral release;  Surgeon: Earlie Server, MD;  Location: [place];  Service: Orthopedics;  Laterality: Right;    SOCIAL HISTORY:  Social History   Tobacco Use  . Smoking status: Current Every Day Smoker    Packs/day: 0.50    Types: Cigarettes  . Smokeless tobacco: Never Used  Substance Use Topics  . Alcohol use: Not Currently    Alcohol/week: 3.0 standard drinks    Types: 3 Cans of beer per week    Comment: 16 oz per week    FAMILY HISTORY:  Family History  Problem Relation Age of Onset  . COPD Mother   . Cancer Mother        Bone  . Asthma Mother   . Congestive Heart Failure Father     DRUG ALLERGIES:  Allergies  Allergen Reactions  . Amiodarone Nausea And Vomiting  . Aspirin Swelling  . Flexeril [Cyclobenzaprine] Swelling  . Trazamine [Trazodone & Diet Manage Prod] Nausea And Vomiting  . Codeine Rash  . Tramadol Rash    REVIEW OF SYSTEMS:   CONSTITUTIONAL: No fever, fatigue or weakness.  EYES: No blurred or double vision.  EARS, NOSE, AND THROAT: No tinnitus or ear pain.  RESPIRATORY: Positive cough, positive shortness of breath, positive wheezing or hemoptysis.  CARDIOVASCULAR: No chest pain, orthopnea, edema.  GASTROINTESTINAL: No nausea, vomiting, diarrhea or abdominal pain.  GENITOURINARY: No dysuria,  hematuria.  ENDOCRINE: No polyuria, nocturia,  HEMATOLOGY: No anemia, easy bruising or bleeding SKIN: No rash or lesion. MUSCULOSKELETAL: No joint pain or arthritis.   NEUROLOGIC: No tingling, numbness, weakness.  PSYCHIATRY: No anxiety or depression.   MEDICATIONS AT HOME:  Prior to Admission medications   Medication Sig Start Date End Date Taking? Authorizing Provider  bisacodyl (DULCOLAX) 5 MG EC tablet Take 2 tablets (10 mg total) by mouth daily as needed for moderate constipation. 07/21/18  Yes Epifanio Lesches, MD  budesonide-formoterol (SYMBICORT) 160-4.5 MCG/ACT inhaler Inhale 2 puffs into the lungs 2 (two) times daily.  02/26/16  Yes [provider]  bumetanide (BUMEX) 2 MG tablet Take 2 tablets (4 mg total) by mouth daily. 07/22/18  Yes Epifanio Lesches, MD  Calcium Carbonate-Vit D-Min (GNP CALCIUM 1200) 1200-1000 MG-UNIT CHEW Chew 1,200 mg by mouth daily with breakfast. Take in combination with vitamin D and magnesium. 11/14/18 05/13/19 Yes Milinda Pointer, MD  Cholecalciferol (VITAMIN D3) 125 MCG (5000 UT) CAPS Take 1 capsule (5,000 Units total) by mouth daily with breakfast. Take along with calcium and magnesium. 11/14/18 05/13/19 Yes Milinda Pointer, MD  ergocalciferol (VITAMIN D2) 1.25 MG (50000 UT) capsule Take 1 capsule (50,000 Units total) by mouth 2 (two) times a week. X 6 weeks. 11/14/18 12/26/18 Yes Milinda Pointer, MD  gabapentin (NEURONTIN) 300 MG capsule Take 600 mg by mouth 3 (three) times daily.  07/22/15  Yes [provider]  ipratropium-albuterol (DUONEB) 0.5-2.5 (3) MG/3ML SOLN Inhale 3 mLs into the lungs 2 (two) times daily as needed (respiratory).  02/26/16 12/20/18 Yes [provider]  lactulose (CHRONULAC) 10 GM/15ML solution Take 45 mLs (30 g total) by mouth 2 (two) times daily as needed for mild constipation. 07/21/18  Yes Epifanio Lesches, MD  Magnesium 500 MG CAPS Take 1 capsule (500 mg total) by mouth 2 (two) times daily at 8 am and 10 pm. 11/14/18 05/13/19 Yes Milinda Pointer, MD  magnesium oxide (MAG-OX) 400 MG tablet Take 400 mg by mouth 2 (two) times daily. 07/11/18 07/11/19 Yes [provider]  meclizine (ANTIVERT) 12.5 MG tablet Take 25 mg by mouth 3 (three) times daily as needed for dizziness.   Yes [provider]  metoprolol tartrate (LOPRESSOR) 100 MG tablet Take 1 tablet (100 mg total) by mouth 2 (two) times daily. 11/05/18  Yes Mody, Ulice Bold, MD  nicotine (NICODERM CQ - DOSED IN MG/24 HR) 7 mg/24hr patch Place 1 patch (7 mg total) onto the skin daily. 11/06/18  Yes Mody, Ulice Bold, MD  nitroGLYCERIN (NITROSTAT) 0.4 MG SL tablet Place  1 tablet (0.4 mg total) under the tongue every 5 (five) minutes as needed for chest pain. 11/05/18  Yes Mody, Ulice Bold, MD  omeprazole (PRILOSEC) 40 MG capsule Take 40 mg by mouth 2 (two) times daily.   Yes [provider]  Oxycodone HCl 10 MG TABS Take 0.5-1 tablets (5-10 mg total) by mouth 2 (two) times daily as needed. Must last 30 days. MAX.: 2/day 11/21/18 12/21/18 Yes Milinda Pointer, MD  promethazine (PHENERGAN) 12.5 MG tablet Take 1 tablet (12.5 mg total) by mouth every 6 (six) hours as needed for nausea or vomiting. 11/06/18  Yes Mody, Ulice Bold, MD  rivaroxaban (XARELTO) 20 MG TABS tablet Take 20 mg by mouth every evening.  07/11/18 07/11/19 Yes [provider]  tiZANidine (ZANAFLEX) 4 MG tablet Take 4 mg by mouth at bedtime.  08/10/18  Yes [provider]  butalbital-acetaminophen-caffeine (FIORICET, ESGIC) 50-325-40 MG tablet Take 1-2 tablets by mouth  every 6 (six) hours as needed for headache. 10/11/18 10/11/19  Rudene Re, MD  topiramate (TOPAMAX) 25 MG tablet Take 25 mg by mouth 2 (two) times daily.  08/10/18   [provider]      PHYSICAL EXAMINATION:   VITAL SIGNS: Blood pressure 112/87, pulse 93, temperature 97.9 F (36.6 C), temperature source Oral, resp. rate 14, height 5\' 4"  (1.626 m), weight 120.7 kg, last menstrual period 11/08/2009, SpO2 96 %.  GENERAL:  52 y.o.-year-old patient lying in the bed with no acute distress.  EYES: Pupils equal, round, reactive to light and accommodation. No scleral icterus. Extraocular muscles intact.  HEENT: Head atraumatic, normocephalic. Oropharynx and nasopharynx clear.  NECK:  Supple, no jugular venous distention. No thyroid enlargement, no tenderness.  LUNGS: Bilateral wheezing throughout both lungs no accessory muscle usage cARDIOVASCULAR: S1, S2 normal. No murmurs, rubs, or gallops.  ABDOMEN: Soft, nontender, nondistended. Bowel sounds present. No organomegaly or mass.  EXTREMITIES: 1+ pedal edema,  cyanosis, or clubbing.  NEUROLOGIC: Cranial nerves II through XII are intact. Muscle strength 5/5 in all extremities. Sensation intact. Gait not checked.  PSYCHIATRIC: The patient is alert and oriented x 3.  SKIN: No obvious rash, lesion, or ulcer.   LABORATORY PANEL:   CBC Recent Labs  Lab 11/17/18 1400  WBC 9.1  HGB 11.8*  HCT 38.3  PLT 285  MCV 81.1  MCH 25.0*  MCHC 30.8  RDW 16.7*   ------------------------------------------------------------------------------------------------------------------  Chemistries  Recent Labs  Lab 11/16/18 1357 11/17/18 1400  NA 142 140  K 3.4* 3.9  CL 103 101  CO2 29 30  GLUCOSE 128* 97  BUN 15 16  CREATININE 0.60 0.64  CALCIUM 9.0 9.1   ------------------------------------------------------------------------------------------------------------------ estimated creatinine clearance is 106.5 mL/min (by C-G formula based on SCr of 0.64 mg/dL). ------------------------------------------------------------------------------------------------------------------ No results for input(s): TSH, T4TOTAL, T3FREE, THYROIDAB in the last 72 hours.  Invalid input(s): FREET3   Coagulation profile Recent Labs  Lab 11/17/18 1400  INR 1.24   ------------------------------------------------------------------------------------------------------------------- No results for input(s): DDIMER in the last 72 hours. -------------------------------------------------------------------------------------------------------------------  Cardiac Enzymes Recent Labs  Lab 11/17/18 1400  TROPONINI <0.03   ------------------------------------------------------------------------------------------------------------------ Invalid input(s): POCBNP  ---------------------------------------------------------------------------------------------------------------  Urinalysis    Component Value Date/Time   COLORURINE YELLOW (A) 07/17/2018 1558   APPEARANCEUR CLEAR  (A) 07/17/2018 1558   APPEARANCEUR CLEAR 02/20/2015 1015   LABSPEC 1.019 07/17/2018 1558   LABSPEC 1.016 02/20/2015 1015   PHURINE 7.0 07/17/2018 1558   GLUCOSEU NEGATIVE 07/17/2018 1558   GLUCOSEU NEGATIVE 02/20/2015 1015   HGBUR NEGATIVE 07/17/2018 1558   BILIRUBINUR NEGATIVE 07/17/2018 1558   BILIRUBINUR NEGATIVE 02/20/2015 1015   KETONESUR NEGATIVE 07/17/2018 1558   PROTEINUR NEGATIVE 07/17/2018 1558   NITRITE NEGATIVE 07/17/2018 1558   LEUKOCYTESUR MODERATE (A) 07/17/2018 1558   LEUKOCYTESUR 2+ 02/20/2015 1015     RADIOLOGY: Dg Chest Port 1 View  Result Date: 11/17/2018 CLINICAL DATA:  Chest pain. EXAM: PORTABLE CHEST 1 VIEW COMPARISON:  CT scan and radiograph of November 04, 2018. FINDINGS: Stable cardiomegaly with central pulmonary vascular congestion. Minimal bilateral pulmonary edema may be present. Hypoinflation of the lungs is noted. Status post cardiac valve repair. No pneumothorax or pleural effusion is noted. Bony thorax is unremarkable. IMPRESSION: Stable cardiomegaly with central pulmonary vascular congestion and possible bilateral pulmonary edema. Hypoinflation of the lungs is noted. Electronically Signed   By: Marijo Conception, M.D.   On: 11/17/2018 14:55    EKG: Orders placed or performed during the hospital encounter  of 11/17/18  . ED EKG within 10 minutes  . ED EKG within 10 minutes    IMPRESSION AND PLAN: Patient is a 53 year old African-American female with both COPD and CHF presenting with worsening shortness of  1.  Acute on chronic diastolic CHF we will treat with IV Lasix Continue metoprolol has doing at home  2.  Acute on chronic COPD exacerbation we will treat with nebulizers every 6 hours, IV Solu-Medrol as well as oral doxycycline for acute bronchitis  3.  Hypertension continue metoprolol  4.  GERD continue omeprazole  5.  Nicotine abuse smoking cessation provided 4 minutes spent strongly recommended she stop smoking  All the records are  reviewed and case discussed with ED provider. Management plans discussed with the patient, family and they are in agreement.  CODE STATUS: Code Status History    Date Active Date Inactive Code Status Order ID Comments User Context   11/04/2018 1601 11/06/2018 1358 Full Code [id number]  Nicholes Mango, MD Inpatient   05/22/2018 1222 05/25/2018 2208 Full Code [id number]  Demetrios Loll, MD Inpatient   04/22/2018 1440 04/25/2018 1736 Full Code [id number]  Epifanio Lesches, MD ED   12/20/2017 1946 12/21/2017 1447 Full Code [id number]  Gorden Harms, MD ED   09/04/2016 1449 09/05/2016 1715 Full Code [id number]  Chriss Czar, PA-C Inpatient       TOTAL TIME TAKING CARE OF THIS PATIENT: 55 minutes.    Dustin Flock M.D on 11/17/2018 at 4:49 PM  Between 7am to 6pm - Pager - [phone]  After 6pm go to [email or web address] - password [email or web address] [facility]  [phone]  CC: Primary care physician; Kathee Delton, MD

## 2018-11-17 NOTE — ED Provider Notes (Signed)
[facility] Emergency Department Provider Note ____________________________________________   First MD Initiated Contact with Patient 11/17/18 1427     (approximate)  I have reviewed the triage vital signs and the nursing notes.   HISTORY  Chief Complaint Shortness of Breath; Hypotension; and Chest Pain  Level 5 caveat: History of present illness limited due to respiratory distress  HPI Chelsea Ramsey is a 52 y.o. female with PMH as noted below including CHF and COPD who presents with worsening shortness of breath after being seen in heart failure clinic today.  She has had a recent 10 pound weight gain and despite treatment with Lasix as an outpatient yesterday, her weight and symptoms have not improved today.  The patient reports chest pain and cough but denies fever.  Past Medical History:  Diagnosis Date  . Allergy    seasonal  . Anxiety   . Arthritis    Right Knee  . Asthma   . CHF (congestive heart failure) ([place])   . COPD (chronic obstructive pulmonary disease) ([place])   . Coronary artery disease    Leaky heart valve  . Fibromyalgia   . GERD (gastroesophageal reflux disease)   . Hypertension   . Pneumonia   . PUD (peptic ulcer disease)   . Pulmonary HTN ([place])   . Rheumatic fever/heart disease   . Sleep apnea     Patient Active Problem List   Diagnosis Date Noted  . Class 3 severe obesity with body mass index (BMI) of 45.0 to 49.9 in adult ([place]) 11/14/2018  . Chronic anticoagulation (Xarelto) 11/14/2018  . Vitamin D deficiency 11/14/2018  . Chronic knee pain after total replacement (Right) 11/14/2018  . Chest pain 10/17/2018  . Chronic low back pain (Primary Area of Pain) (Bilateral) w/ sciatica (Left) 10/10/2018  . Chronic lower extremity pain (Secondary Area of Pain) (Left) 10/10/2018  . Sternal pain (Tertiary Area of Pain) 10/10/2018  . Fibromyalgia (Fourth Area of Pain) 10/10/2018  . Chronic knee pain (Fifth Area of Pain) (Right)  10/10/2018  . Chronic sacroiliac joint pain 10/10/2018  . Chronic pain syndrome 10/10/2018  . Long term current use of opiate analgesic 10/10/2018  . Pharmacologic therapy 10/10/2018  . Disorder of skeletal system 10/10/2018  . Problems influencing health status 10/10/2018  . Chronic diastolic heart failure ([place]) 07/26/2018  . HTN (hypertension) 07/26/2018  . Acute CHF (congestive heart failure) ([place]) 07/17/2018  . Melena   . Nausea and vomiting   . Acute esophagogastric ulcer   . Intractable vomiting with nausea   . GIB (gastrointestinal bleeding) 05/22/2018  . A-fib ([place]) 04/22/2018  . COPD (chronic obstructive pulmonary disease) ([place]) 12/21/2017  . COPD (chronic obstructive pulmonary disease) with acute bronchitis ([place]) 12/20/2017  . Primary localized osteoarthritis of right knee 09/04/2016    Past Surgical History:  Procedure Laterality Date  . ADENOIDECTOMY    . ESOPHAGOGASTRODUODENOSCOPY (EGD) WITH PROPOFOL N/A 05/25/2018   Procedure: ESOPHAGOGASTRODUODENOSCOPY (EGD) WITH PROPOFOL;  Surgeon: Lucilla Lame, MD;  [facility];  Service: Endoscopy;  Laterality: N/A;  . MITRAL VALVE REPLACEMENT    . MULTIPLE TOOTH EXTRACTIONS    . TONSILLECTOMY    . TOTAL KNEE ARTHROPLASTY Right 09/04/2016   Procedure: TOTAL KNEE ARTHROPLASTY; with lateral release;  Surgeon: Earlie Server, MD;  Location: [place];  Service: Orthopedics;  Laterality: Right;    Prior to Admission medications   Medication Sig Start Date End Date Taking? Authorizing Provider  bisacodyl (DULCOLAX) 5 MG EC tablet Take 2  tablets (10 mg total) by mouth daily as needed for moderate constipation. 07/21/18  Yes Epifanio Lesches, MD  budesonide-formoterol (SYMBICORT) 160-4.5 MCG/ACT inhaler Inhale 2 puffs into the lungs 2 (two) times daily. 02/26/16  Yes [provider]  bumetanide (BUMEX) 2 MG tablet Take 2 tablets (4 mg total) by mouth daily. 07/22/18  Yes Epifanio Lesches, MD  Calcium Carbonate-Vit  D-Min (GNP CALCIUM 1200) 1200-1000 MG-UNIT CHEW Chew 1,200 mg by mouth daily with breakfast. Take in combination with vitamin D and magnesium. 11/14/18 05/13/19 Yes Milinda Pointer, MD  Cholecalciferol (VITAMIN D3) 125 MCG (5000 UT) CAPS Take 1 capsule (5,000 Units total) by mouth daily with breakfast. Take along with calcium and magnesium. 11/14/18 05/13/19 Yes Milinda Pointer, MD  ergocalciferol (VITAMIN D2) 1.25 MG (50000 UT) capsule Take 1 capsule (50,000 Units total) by mouth 2 (two) times a week. X 6 weeks. 11/14/18 12/26/18 Yes Milinda Pointer, MD  gabapentin (NEURONTIN) 300 MG capsule Take 600 mg by mouth 3 (three) times daily.  07/22/15  Yes [provider]  ipratropium-albuterol (DUONEB) 0.5-2.5 (3) MG/3ML SOLN Inhale 3 mLs into the lungs 2 (two) times daily as needed (respiratory).  02/26/16 12/20/18 Yes [provider]  lactulose (CHRONULAC) 10 GM/15ML solution Take 45 mLs (30 g total) by mouth 2 (two) times daily as needed for mild constipation. 07/21/18  Yes Epifanio Lesches, MD  Magnesium 500 MG CAPS Take 1 capsule (500 mg total) by mouth 2 (two) times daily at 8 am and 10 pm. 11/14/18 05/13/19 Yes Milinda Pointer, MD  magnesium oxide (MAG-OX) 400 MG tablet Take 400 mg by mouth 2 (two) times daily. 07/11/18 07/11/19 Yes [provider]  meclizine (ANTIVERT) 12.5 MG tablet Take 25 mg by mouth 3 (three) times daily as needed for dizziness.   Yes [provider]  metoprolol tartrate (LOPRESSOR) 100 MG tablet Take 1 tablet (100 mg total) by mouth 2 (two) times daily. 11/05/18  Yes Mody, Ulice Bold, MD  nicotine (NICODERM CQ - DOSED IN MG/24 HR) 7 mg/24hr patch Place 1 patch (7 mg total) onto the skin daily. 11/06/18  Yes Mody, Ulice Bold, MD  nitroGLYCERIN (NITROSTAT) 0.4 MG SL tablet Place 1 tablet (0.4 mg total) under the tongue every 5 (five) minutes as needed for chest pain. 11/05/18  Yes Mody, Ulice Bold, MD  omeprazole (PRILOSEC) 40 MG capsule Take 40 mg by mouth  2 (two) times daily.   Yes [provider]  Oxycodone HCl 10 MG TABS Take 0.5-1 tablets (5-10 mg total) by mouth 2 (two) times daily as needed. Must last 30 days. MAX.: 2/day 11/21/18 12/21/18 Yes Milinda Pointer, MD  promethazine (PHENERGAN) 12.5 MG tablet Take 1 tablet (12.5 mg total) by mouth every 6 (six) hours as needed for nausea or vomiting. 11/06/18  Yes Mody, Ulice Bold, MD  rivaroxaban (XARELTO) 20 MG TABS tablet Take 20 mg by mouth every evening.  07/11/18 07/11/19 Yes [provider]  tiZANidine (ZANAFLEX) 4 MG tablet Take 4 mg by mouth at bedtime.  08/10/18  Yes [provider]  butalbital-acetaminophen-caffeine (FIORICET, ESGIC) 50-325-40 MG tablet Take 1-2 tablets by mouth every 6 (six) hours as needed for headache. 10/11/18 10/11/19  Rudene Re, MD  topiramate (TOPAMAX) 25 MG tablet Take 25 mg by mouth 2 (two) times daily.  08/10/18   [provider]    Allergies Amiodarone; Aspirin; Flexeril [cyclobenzaprine]; Trazamine [trazodone & diet manage prod]; Codeine; and Tramadol  Family History  Problem Relation Age of Onset  . COPD Mother   .  Cancer Mother        Bone  . Asthma Mother   . Congestive Heart Failure Father     Social History Social History   Tobacco Use  . Smoking status: Current Every Day Smoker    Packs/day: 0.50    Types: Cigarettes  . Smokeless tobacco: Never Used  Substance Use Topics  . Alcohol use: Not Currently    Alcohol/week: 3.0 standard drinks    Types: 3 Cans of beer per week    Comment: 16 oz per week  . Drug use: Not Currently    Comment: Previous use of cocaine and marijuana last use 07/31/16     Review of Systems Level 5 caveat: Review of systems limited due to respiratory distress Constitutional: No fever. Cardiovascular: Positive for chest pain. Respiratory: Positive for shortness of breath. Gastrointestinal: Positive for nausea.  Skin: Negative for rash. Neurological: Negative for  headache.  ____________________________________________   PHYSICAL EXAM:  VITAL SIGNS: ED Triage Vitals  Enc Vitals Group     BP 11/17/18 1353 110/75     Pulse Rate 11/17/18 1353 (!) 55     Resp 11/17/18 1353 (!) 33     Temp 11/17/18 1353 98.1 F (36.7 C)     Temp Source 11/17/18 1353 Oral     SpO2 11/17/18 1353 100 %     Weight 11/17/18 1422 266 lb (120.7 kg)     Height 11/17/18 1354 5\' 4"  (1.626 m)     Head Circumference --      Peak Flow --      Pain Score 11/17/18 1353 7     Pain Loc --      Pain Edu? --      Excl. in [place]? --     Constitutional: Alert and oriented.  Uncomfortable appearing.  Eyes: Conjunctivae are normal.  Head: Atraumatic. Nose: No congestion/rhinnorhea. Mouth/Throat: Mucous membranes are moist.   Neck: Normal range of motion.  Cardiovascular: Normal rate, regular rhythm. Grossly normal heart sounds.  Good peripheral circulation. Respiratory: Increased respiratory effort with accessory muscle use.  Diffuse wheezing bilaterally.. Gastrointestinal: Soft and nontender. No distention.  Genitourinary: No flank tenderness. Musculoskeletal: 1+ bilateral lower extremity edema.  Extremities warm and well perfused.  Neurologic:  Normal speech and language. No gross focal neurologic deficits are appreciated.  Skin:  Skin is warm and dry. No rash noted. Psychiatric: Mood and affect are normal. Speech and behavior are normal.  ____________________________________________   LABS (all labs ordered are listed, but only abnormal results are displayed)  Labs Reviewed  CBC - Abnormal; Notable for the following components:      Result Value   Hemoglobin 11.8 (*)    MCH 25.0 (*)    RDW 16.7 (*)    All other components within normal limits  PROTIME-INR - Abnormal; Notable for the following components:   Prothrombin Time 15.5 (*)    All other components within normal limits  BRAIN NATRIURETIC PEPTIDE - Abnormal; Notable for the following components:   B  Natriuretic Peptide 203.0 (*)    All other components within normal limits  BASIC METABOLIC PANEL  TROPONIN I  INFLUENZA PANEL BY PCR (TYPE A & B)  POC URINE PREG, ED   ____________________________________________  EKG  ED ECG REPORT I, Arta Silence, the attending physician, personally viewed and interpreted this ECG.  Date: 11/17/2018 EKG Time: 1353 Rate: 87 Rhythm: normal sinus rhythm QRS Axis: normal Intervals: normal ST/T Wave abnormalities: normal Narrative Interpretation: no evidence of  acute ischemia  ____________________________________________  RADIOLOGY  CXR: Pulmonary edema with no focal infiltrate  ____________________________________________   PROCEDURES  Procedure(s) performed: No  Procedures  Critical Care performed: Yes  CRITICAL CARE Performed by: Arta Silence   Total critical care time: 30 minutes  Critical care time was exclusive of separately billable procedures and treating other patients.  Critical care was necessary to treat or prevent imminent or life-threatening deterioration.  Critical care was time spent personally by me on the following activities: development of treatment plan with patient and/or surrogate as well as nursing, discussions with consultants, evaluation of patient's response to treatment, examination of patient, obtaining history from patient or surrogate, ordering and performing treatments and interventions, ordering and review of laboratory studies, ordering and review of radiographic studies, pulse oximetry and re-evaluation of patient's condition. ____________________________________________   INITIAL IMPRESSION / ASSESSMENT AND PLAN / ED COURSE  Pertinent labs & imaging results that were available during my care of the patient were reviewed by me and considered in my medical decision making (see chart for details).  52 year old female with history of CHF and COPD presents with acutely worsening  shortness of breath and chest pain.  She was seen at the CHF clinic and has gained 10 pounds recently.  Her symptoms did not improve despite being given IV Lasix yesterday.  I reviewed the past medical records in [place].  The patient was most recently admitted earlier this month with chest pain and ruled out for ACS.  On exam, the patient is uncomfortable appearing with increased work of breathing and diffuse wheezing bilaterally.  Her vital signs are normal except for her respiratory rate.  Differential includes COPD exacerbation, acute on chronic CHF, flu, acute bronchitis, or pneumonia.  We will obtain chest x-ray and lab work-up.  I placed the patient on BiPAP due to her increased work of breathing (although she is maintaining an O2 saturation in the high 90s on nasal cannula).  I will give IV Solu-Medrol and Lasix, as well as albuterol.  ----------------------------------------- 7:46 PM on 11/17/2018 -----------------------------------------  The patient improved after BiPAP and the medications above.  She was able to be weaned off of the BiPAP and onto nasal cannula.  Chest x-ray was consistent with likely pulmonary edema although I continued to suspect a strong component of COPD.  I signed the patient out to the hospitalist. ____________________________________________   FINAL CLINICAL IMPRESSION(S) / ED DIAGNOSES  Final diagnoses:  Acute respiratory failure with hypoxia ([place])  COPD exacerbation ([place])      NEW MEDICATIONS STARTED DURING THIS VISIT:  Current Discharge Medication List       Note:  This document was prepared using Dragon voice recognition software and may include unintentional dictation errors.    Arta Silence, MD 11/17/18 1946

## 2018-11-17 NOTE — ED Notes (Signed)
Pt to be trialed off BiPap, taken off per this RN and placed on 6L nasal cannula at this time.

## 2018-11-17 NOTE — ED Notes (Signed)
Patient transported to 253 

## 2018-11-17 NOTE — ED Notes (Signed)
Pt tearful, states, "It feels like somebody is beating me in the chest."  EDP notified; see new orders.

## 2018-11-17 NOTE — Patient Instructions (Signed)
Continue weighing daily and call for an overnight weight gain of > 2 pounds or a weekly weight gain of >5 pounds. 

## 2018-11-17 NOTE — Progress Notes (Signed)
Patient ID: Chelsea Ramsey, female    DOB: [birth year], 52 y.o.   MRN: [id number]  HPI  Chelsea Ramsey is a 52 y/o female with a history of CAD (leaky valve), HTN, pulmonary HTN, COPD, GERD, obstructive sleep apnea, previous tobacco use and chronic heart failure.   Echo report from 08/11/18 reviewed and showed an EF of 40-45% which has declined from previous echo. + moderate TR and mild/moderately elevated PA pressure.  Echo report from 07/20/18 reviewed and showed an EF of 50-55% along with mild MR and an elevated PA pressure of 50 mm Hg.   Admitted 11/04/18 due to atypical chest pain. Cardiology consult obtained. Medications adjusted and she was discharged after 2 days. Was in the ED 10/17/18 but left without being seen. Was in the ED 10/11/18 due to chest pain where she was treated and released. Admitted 08/04/18 due to dizziness and gait instability. Neurology consulted. CT/brain MRI were negative. Occipital nerve injection given for headache. Medications adjusted and discharged after 6 days. Admitted 07/17/18 due to acute on chronic heart failure. Initially given IV diuretics and then transitioned to oral diuretics. Cardiology consult obtained. Discharged after 4 days. Admitted 06/20/18 due to having her mitral valve replaced. Dobutamine and epinephrine were weaned and she was extubated. Had atrial fibrillation exacerbation which involved medication adjustments. Had cardioversion done 06/30/18 with resultant short time in NSR but then reverted back to atrial fibrillation. Unable to tolerate amiodarone due to nausea and vomiting. Discharged after 22 days.   She presents today for a follow-up visit with a chief complaint of worsening shortness of breath since yesterday. She says that she's also having more chest pain along with dizziness and nausea. She's has associated wheezing , fatigue, cough, anxiety and abdominal distention along with this. Denies any swelling or palpitations. She received 80mg  IV lasix/  45meq PO potassium yesterday.    Past Medical History:  Diagnosis Date  . Allergy    seasonal  . Anxiety   . Arthritis    Right Knee  . Asthma   . CHF (congestive heart failure) ([place])   . COPD (chronic obstructive pulmonary disease) ([place])   . Coronary artery disease    Leaky heart valve  . Fibromyalgia   . GERD (gastroesophageal reflux disease)   . Hypertension   . Pneumonia   . PUD (peptic ulcer disease)   . Pulmonary HTN ([place])   . Rheumatic fever/heart disease   . Sleep apnea    Past Surgical History:  Procedure Laterality Date  . ADENOIDECTOMY    . ESOPHAGOGASTRODUODENOSCOPY (EGD) WITH PROPOFOL N/A 05/25/2018   Procedure: ESOPHAGOGASTRODUODENOSCOPY (EGD) WITH PROPOFOL;  Surgeon: Lucilla Lame, MD;  Location: [facility] ENDOSCOPY;  Service: Endoscopy;  Laterality: N/A;  . MITRAL VALVE REPLACEMENT    . MULTIPLE TOOTH EXTRACTIONS    . TONSILLECTOMY    . TOTAL KNEE ARTHROPLASTY Right 09/04/2016   Procedure: TOTAL KNEE ARTHROPLASTY; with lateral release;  Surgeon: Earlie Server, MD;  Location: [place];  Service: Orthopedics;  Laterality: Right;   Family History  Problem Relation Age of Onset  . COPD Mother   . Cancer Mother        Bone  . Asthma Mother   . Congestive Heart Failure Father    Social History   Tobacco Use  . Smoking status: Current Every Day Smoker    Packs/day: 0.50    Types: Cigarettes  . Smokeless tobacco: Never Used  Substance Use Topics  . Alcohol use: Not Currently  Alcohol/week: 3.0 standard drinks    Types: 3 Cans of beer per week    Comment: 16 oz per week   Allergies  Allergen Reactions  . Amiodarone Nausea And Vomiting  . Aspirin Swelling  . Flexeril [Cyclobenzaprine] Swelling  . Trazamine [Trazodone & Diet Manage Prod] Nausea And Vomiting  . Codeine Rash  . Tramadol Rash   Prior to Admission medications   Medication Sig Start Date End Date Taking? Authorizing Provider  bisacodyl (DULCOLAX) 5 MG EC tablet Take 2 tablets (10 mg total) by  mouth daily as needed for moderate constipation. 07/21/18  Yes Epifanio Lesches, MD  budesonide-formoterol (SYMBICORT) 160-4.5 MCG/ACT inhaler Inhale 2 puffs into the lungs 2 (two) times daily. 02/26/16  Yes [provider]  bumetanide (BUMEX) 2 MG tablet Take 2 tablets (4 mg total) by mouth daily. 07/22/18  Yes Epifanio Lesches, MD  butalbital-acetaminophen-caffeine (FIORICET, ESGIC) [phone] MG tablet Take 1-2 tablets by mouth every 6 (six) hours as needed for headache. 10/11/18 10/11/19 Yes Veronese, [place], MD  Calcium Carbonate-Vit D-Min [facility] CALCIUM 1200) 1200-1000 MG-UNIT CHEW Chew 1,200 mg by mouth daily with breakfast. Take in combination with vitamin D and magnesium. 11/14/18 05/13/19 Yes Milinda Pointer, MD  Cholecalciferol (VITAMIN D3) 125 MCG (5000 UT) CAPS Take 1 capsule (5,000 Units total) by mouth daily with breakfast. Take along with calcium and magnesium. 11/14/18 05/13/19 Yes Milinda Pointer, MD  ergocalciferol (VITAMIN D2) 1.25 MG (50000 UT) capsule Take 1 capsule (50,000 Units total) by mouth 2 (two) times a week. X 6 weeks. 11/14/18 12/26/18 Yes Milinda Pointer, MD  gabapentin (NEURONTIN) 300 MG capsule Take 600 mg by mouth 3 (three) times daily.  07/22/15  Yes [provider]  ipratropium-albuterol (DUONEB) 0.5-2.5 (3) MG/3ML SOLN Inhale 3 mLs into the lungs 2 (two) times daily as needed (respiratory).  02/26/16 12/20/18 Yes [provider]  lactulose (CHRONULAC) 10 GM/15ML solution Take 45 mLs (30 g total) by mouth 2 (two) times daily as needed for mild constipation. 07/21/18  Yes Epifanio Lesches, MD  Magnesium 500 MG CAPS Take 1 capsule (500 mg total) by mouth 2 (two) times daily at 8 am and 10 pm. 11/14/18 05/13/19 Yes Milinda Pointer, MD  magnesium oxide (MAG-OX) 400 MG tablet Take 400 mg by mouth 2 (two) times daily. 07/11/18 07/11/19 Yes [provider]  metoprolol tartrate (LOPRESSOR) 100 MG tablet Take 1 tablet (100 mg  total) by mouth 2 (two) times daily. Patient taking differently: Take 200 mg by mouth 2 (two) times daily.  11/05/18  Yes Mody, Ulice Bold, MD  nicotine (NICODERM CQ - DOSED IN MG/24 HR) 7 mg/24hr patch Place 1 patch (7 mg total) onto the skin daily. 11/06/18  Yes Mody, Ulice Bold, MD  nitroGLYCERIN (NITROSTAT) 0.4 MG SL tablet Place 1 tablet (0.4 mg total) under the tongue every 5 (five) minutes as needed for chest pain. 11/05/18  Yes Mody, Ulice Bold, MD  omeprazole (PRILOSEC) 40 MG capsule Take 40 mg by mouth 2 (two) times daily.   Yes [provider]  Oxycodone HCl 10 MG TABS Take 0.5-1 tablets (5-10 mg total) by mouth 2 (two) times daily as needed for up to 7 days. Must last 7 days. 11/14/18 11/21/18 Yes Milinda Pointer, MD  Oxycodone HCl 10 MG TABS Take 0.5-1 tablets (5-10 mg total) by mouth 2 (two) times daily as needed. Must last 30 days. MAX.: 2/day 11/21/18 12/21/18 Yes Milinda Pointer, MD  promethazine (PHENERGAN) 12.5 MG tablet Take 1 tablet (12.5 mg total) by mouth  every 6 (six) hours as needed for nausea or vomiting. 11/06/18  Yes Mody, Ulice Bold, MD  rivaroxaban (XARELTO) 20 MG TABS tablet Take 20 mg by mouth every evening.  07/11/18 07/11/19 Yes [provider]  tiZANidine (ZANAFLEX) 4 MG tablet Take 4 mg by mouth at bedtime.  08/10/18  Yes [provider]  topiramate (TOPAMAX) 25 MG tablet Take 25 mg by mouth 2 (two) times daily.  08/10/18  Yes [provider]    Review of Systems  Constitutional: Positive for fatigue (easily). Negative for appetite change.  HENT: Negative for congestion, postnasal drip and sore throat.   Eyes: Positive for visual disturbance (blurry vision at times).  Respiratory: Positive for cough (dry cough), shortness of breath (worsening) and wheezing.   Cardiovascular: Positive for chest pain (pressure right upper chest). Negative for palpitations and leg swelling.  Gastrointestinal: Positive for abdominal distention and nausea. Negative for  abdominal pain.  Endocrine: Negative.   Genitourinary: Negative.   Musculoskeletal: Negative for back pain and neck pain.  Skin: Negative.   Allergic/Immunologic: Negative.   Neurological: Positive for light-headedness (worsening). Negative for dizziness.  Hematological: Negative for adenopathy. Does not bruise/bleed easily.  Psychiatric/Behavioral: Positive for sleep disturbance (due to pain). Negative for dysphoric mood. The patient is nervous/anxious.    Vitals:   11/17/18 1321  BP: (!) 89/60  Pulse: 63  Resp: 20  SpO2: 100%  Weight: 267 lb 2 oz (121.2 kg)  Height: 5\' 4"  (1.626 m)   Wt Readings from Last 3 Encounters:  11/17/18 267 lb 2 oz (121.2 kg)  11/16/18 268 lb 6 oz (121.7 kg)  11/14/18 267 lb (121.1 kg)   Lab Results  Component Value Date   CREATININE 0.60 11/16/2018   CREATININE 0.47 11/04/2018   CREATININE 0.37 (L) 10/11/2018    Physical Exam Vitals signs and nursing note reviewed.  Constitutional:      Appearance: She is well-developed.  HENT:     Head: Normocephalic and atraumatic.  Neck:     Musculoskeletal: Normal range of motion and neck supple.     Vascular: No JVD.  Cardiovascular:     Rate and Rhythm: Normal rate and regular rhythm.  Pulmonary:     Effort: Pulmonary effort is normal. No respiratory distress.     Breath sounds: Examination of the right-upper field reveals wheezing. Examination of the left-upper field reveals wheezing. Examination of the right-lower field reveals wheezing. Wheezing present. No rales.  Abdominal:     General: There is distension.     Palpations: Abdomen is soft.  Musculoskeletal:     Right lower leg: She exhibits no tenderness. No edema.     Left lower leg: She exhibits no tenderness. No edema.  Skin:    General: Skin is warm and dry.  Neurological:     Mental Status: She is alert and oriented to person, place, and time.  Psychiatric:        Mood and Affect: Mood is anxious (tearful at times).        Behavior:  Behavior normal.    Assessment & Plan:  1: Acute on Chronic heart failure with preserved ejection fraction- - NYHA class III - weighing daily and she was instructed to call for an overnight weight gain of >2 pounds or a weekly weight gain of >5 pounds - weight down 1.4 pounds from last visit here yesterday - now wheezing and reports more shortness of breath than yesterday - not adding salt and has been reading food  labels. Reviewed the importance of closely following a 2000mg  sodium diet  - mostly drinking water during the day and an occasional soda - currently unable to participate in cardiac rehab due to symptoms - discussed changing her diuretic to torsemide but will hold off due to hypotension and worsening symptoms - BNP 11/16/18 was 275.0 - saw cardiology @ [facility] 09/14/18  2: HTN- - BP low today and even lower than yesterday; patient with worsening dizziness - saw PCP @ [facility] 10/17/18 - BMP 11/16/18 reviewed and showed sodium 142, potassium 3.4, creatinine 0.60 and GFR >60  3: Atrial fibrillation- - had mitral valve replaced July 2019 - cardioverted 06/30/18 - having worsening chest pain over the last 24 hours along with nausea  Patient did not bring her medications nor a list. Each medication was verbally reviewed with the patient and she was encouraged to bring the bottles to every visit to confirm accuracy of list.  Due to patient's worsening symptoms and hypotension, will send patient to the ED for further evaluation/ treatment. Patient is agreeable with this plan.   Will tentatively schedule patient to return here in 2 weeks pending the ED evaluation.

## 2018-11-17 NOTE — ED Notes (Signed)
EDP to bedside to provided patient with updated plan of care.

## 2018-11-17 NOTE — ED Notes (Signed)
RT to bedside to place patient on BiPap

## 2018-11-17 NOTE — ED Triage Notes (Signed)
Pt to ED from home after being seen at heart failure clinic today.  Darylene Price, FNP states patient was seen yesterday d/t 10lb weight gain and was given lasix and potassium outpatient and followed up today and found to still have 10lb weight gain, audible wheezing, and hypotensive.  Pt A&Ox4, states chest pain worse with coughing, SOB.

## 2018-11-18 LAB — CBC
HEMATOCRIT: 37.2 % (ref 36.0–46.0)
Hemoglobin: 11.1 g/dL — ABNORMAL LOW (ref 12.0–15.0)
MCH: 24.9 pg — ABNORMAL LOW (ref 26.0–34.0)
MCHC: 29.8 g/dL — ABNORMAL LOW (ref 30.0–36.0)
MCV: 83.4 fL (ref 80.0–100.0)
Platelets: 263 10*3/uL (ref 150–400)
RBC: 4.46 MIL/uL (ref 3.87–5.11)
RDW: 16.5 % — ABNORMAL HIGH (ref 11.5–15.5)
WBC: 8 10*3/uL (ref 4.0–10.5)
nRBC: 0 % (ref 0.0–0.2)

## 2018-11-18 LAB — BASIC METABOLIC PANEL
Anion gap: 12 (ref 5–15)
BUN: 22 mg/dL — ABNORMAL HIGH (ref 6–20)
CO2: 25 mmol/L (ref 22–32)
Calcium: 8.9 mg/dL (ref 8.9–10.3)
Chloride: 104 mmol/L (ref 98–111)
Creatinine, Ser: 0.79 mg/dL (ref 0.44–1.00)
GFR calc Af Amer: >60 mL/min (ref >60)
GFR calc non Af Amer: >60 mL/min (ref >60)
Glucose, Bld: 223 mg/dL — ABNORMAL HIGH (ref 70–99)
Potassium: 3.4 mmol/L — ABNORMAL LOW (ref 3.5–5.1)
Sodium: 141 mmol/L (ref 135–145)

## 2018-11-18 MED ORDER — ALBUTEROL SULFATE (2.5 MG/3ML) 0.083% IN NEBU
2.5000 mg | INHALATION_SOLUTION | RESPIRATORY_TRACT | Status: DC | PRN
  Administered 2018-11-18: 2.5 mg via RESPIRATORY_TRACT
  Filled 2018-11-18: qty 3

## 2018-11-18 MED ORDER — PROMETHAZINE HCL 25 MG/ML IJ SOLN
12.5000 mg | Freq: Four times a day (QID) | INTRAMUSCULAR | Status: DC | PRN
  Administered 2018-11-18 – 2018-11-19 (×6): 12.5 mg via INTRAVENOUS
  Filled 2018-11-18 (×6): qty 1

## 2018-11-18 MED ORDER — OXYCODONE-ACETAMINOPHEN 5-325 MG PO TABS
2.0000 | ORAL_TABLET | Freq: Four times a day (QID) | ORAL | Status: DC | PRN
  Administered 2018-11-18 – 2018-11-19 (×4): 2 via ORAL
  Filled 2018-11-18 (×4): qty 2

## 2018-11-18 MED ORDER — BUDESONIDE 0.5 MG/2ML IN SUSP
0.5000 mg | Freq: Two times a day (BID) | RESPIRATORY_TRACT | Status: DC
  Administered 2018-11-18 – 2018-11-19 (×3): 0.5 mg via RESPIRATORY_TRACT
  Filled 2018-11-18 (×3): qty 2

## 2018-11-18 MED ORDER — GUAIFENESIN-DM 100-10 MG/5ML PO SYRP
5.0000 mL | ORAL_SOLUTION | ORAL | Status: DC | PRN
  Administered 2018-11-18 – 2018-11-19 (×5): 5 mL via ORAL
  Filled 2018-11-18 (×5): qty 5

## 2018-11-18 MED ORDER — POTASSIUM CHLORIDE CRYS ER 20 MEQ PO TBCR
40.0000 meq | EXTENDED_RELEASE_TABLET | Freq: Once | ORAL | Status: AC
  Administered 2018-11-18: 40 meq via ORAL
  Filled 2018-11-18: qty 2

## 2018-11-18 MED ORDER — OXYCODONE-ACETAMINOPHEN 5-325 MG PO TABS
1.0000 | ORAL_TABLET | Freq: Four times a day (QID) | ORAL | Status: DC | PRN
  Filled 2018-11-18: qty 1

## 2018-11-18 MED ORDER — METHYLPREDNISOLONE SODIUM SUCC 40 MG IJ SOLR
40.0000 mg | Freq: Two times a day (BID) | INTRAMUSCULAR | Status: DC
  Administered 2018-11-18 – 2018-11-19 (×2): 40 mg via INTRAVENOUS
  Filled 2018-11-18 (×2): qty 1

## 2018-11-18 NOTE — Progress Notes (Signed)
[place] [facility] [place] NAME: Chelsea Ramsey    MR#:  [id number]  DATE OF BIRTH:  1966-10-22  SUBJECTIVE:  Patient here with shortness of breath, wheezing and cough  REVIEW OF SYSTEMS:    Review of Systems  Constitutional: Negative for fever, chills weight loss HENT: Negative for ear pain, nosebleeds, congestion, facial swelling, rhinorrhea, neck pain, neck stiffness and ear discharge.   Respiratory: ++for cough, shortness of breath, wheezing  Cardiovascular: Negative for chest pain, palpitations and leg swelling.  Gastrointestinal: Negative for heartburn, abdominal pain, vomiting, diarrhea or consitpation Genitourinary: Negative for dysuria, urgency, frequency, hematuria Musculoskeletal: Negative for back pain or joint pain Neurological: Negative for dizziness, seizures, syncope, focal weakness,  numbness and headaches.  Hematological: Does not bruise/bleed easily.  Psychiatric/Behavioral: Negative for hallucinations, confusion, dysphoric mood    Tolerating Diet: yes      DRUG ALLERGIES:   Allergies  Allergen Reactions  . Amiodarone Nausea And Vomiting  . Aspirin Swelling  . Flexeril [Cyclobenzaprine] Swelling  . Trazamine [Trazodone & Diet Manage Prod] Nausea And Vomiting  . Codeine Rash  . Tramadol Rash    VITALS:  Blood pressure 123/73, pulse 95, temperature (!) 97.3 F (36.3 C), temperature source Oral, resp. rate 18, height 5\' 4"  (1.626 m), weight 120.2 kg, last menstrual period 11/08/2009, SpO2 100 %.  PHYSICAL EXAMINATION:  Constitutional: Appears well-developed and well-nourished. No distress.  These HENT: Normocephalic. Marland Kitchen Oropharynx is clear and moist.  Eyes: Conjunctivae and EOM are normal. PERRLA, no scleral icterus.  Neck: Normal ROM. Neck supple. No JVD. No tracheal deviation. CVS: RRR, S1/S2 +, no murmurs, no gallops, no carotid bruit.  Pulmonary: Normal respiratory effort with faint wheezing  bilaterally Abdominal: Soft. BS +,  no distension, tenderness, rebound or guarding.  Musculoskeletal: Normal range of motion. 1+ LEE and no tenderness.  Neuro: Alert. CN 2-12 grossly intact. No focal deficits. Skin: Skin is warm and dry. No rash noted. Psychiatric: Normal mood and affect.      LABORATORY PANEL:   CBC Recent Labs  Lab 11/18/18 0340  WBC 8.0  HGB 11.1*  HCT 37.2  PLT 263   ------------------------------------------------------------------------------------------------------------------  Chemistries  Recent Labs  Lab 11/18/18 0340  NA 141  K 3.4*  CL 104  CO2 25  GLUCOSE 223*  BUN 22*  CREATININE 0.79  CALCIUM 8.9   ------------------------------------------------------------------------------------------------------------------  Cardiac Enzymes Recent Labs  Lab 11/17/18 1400  TROPONINI <0.03   ------------------------------------------------------------------------------------------------------------------  RADIOLOGY:  Dg Chest Port 1 View  Result Date: 11/17/2018 CLINICAL DATA:  Chest pain. EXAM: PORTABLE CHEST 1 VIEW COMPARISON:  CT scan and radiograph of November 04, 2018. FINDINGS: Stable cardiomegaly with central pulmonary vascular congestion. Minimal bilateral pulmonary edema may be present. Hypoinflation of the lungs is noted. Status post cardiac valve repair. No pneumothorax or pleural effusion is noted. Bony thorax is unremarkable. IMPRESSION: Stable cardiomegaly with central pulmonary vascular congestion and possible bilateral pulmonary edema. Hypoinflation of the lungs is noted. Electronically Signed   By: Marijo Conception, M.D.   On: 11/17/2018 14:55     ASSESSMENT AND PLAN:   52 year old female with a history of chronic diastolic heart failure and COPD who presented to the hospital due to shortness of breath  1.  Acute hypoxic respiratory failure on chronic hypoxic respiratory failure due to COPD exacerbation CHF  exacerbation Patient is on baseline 2 to 3 L of oxygen  2.  Acute on chronic diastolic heart failure: Continue IV  Lasix Monitor intake and output Daily weight CHF clinic upon discharge  3.  Acute exacerbation of COPD with acute bronchitis: Wean IV steroids to twice daily Continue doxycycline for acute bronchitis Continue nebs and inhalers  4.Tobacco dependence: Patient is encouraged to quit smoking. Counseling was provided for 4 minutes.   5.  Essential hypertension: Continue metoprolol  6.  Hypokalemia: Replete    Management plans discussed with the patient and she is in agreement.  CODE STATUS: full  TOTAL TIME TAKING CARE OF THIS PATIENT: 28 minutes.     POSSIBLE D/C 1-2 days home, DEPENDING ON CLINICAL CONDITION.   [name] M.D on 11/18/2018 at 12:17 PM  Between 7am to 6pm - Pager - [phone] After 6pm go to [email or web address] - password [email or web address] [facility]  Office  [phone]  CC: Primary care physician; Kathee Delton, MD  Note: This dictation was prepared with Dragon dictation along with smaller phrase technology. Any transcriptional errors that result from this process are unintentional.

## 2018-11-18 NOTE — Progress Notes (Signed)
Pt has had no urine output since 6am this morning, per pt does not feel the urge to urine, bladder scanned pt yieled >602 cc,  Dr. Benjie Karvonen notified and received verbal orders for I&o cath. Will continue to monitor.

## 2018-11-18 NOTE — Plan of Care (Signed)
  Problem: Education: Goal: Knowledge of General Education information will improve Description: Including pain rating scale, medication(s)/side effects and non-pharmacologic comfort measures Outcome: Progressing   Problem: Health Behavior/Discharge Planning: Goal: Ability to manage health-related needs will improve Outcome: Progressing   Problem: Clinical Measurements: Goal: Will remain free from infection Outcome: Progressing   

## 2018-11-19 ENCOUNTER — Inpatient Hospital Stay: Payer: Medicaid Other

## 2018-11-19 LAB — BASIC METABOLIC PANEL
Anion gap: 7 (ref 5–15)
BUN: 15 mg/dL (ref 6–20)
CO2: 27 mmol/L (ref 22–32)
Calcium: 8.8 mg/dL — ABNORMAL LOW (ref 8.9–10.3)
Chloride: 106 mmol/L (ref 98–111)
Creatinine, Ser: 0.55 mg/dL (ref 0.44–1.00)
GFR calc Af Amer: >60 mL/min (ref >60)
GFR calc non Af Amer: >60 mL/min (ref >60)
GLUCOSE: 142 mg/dL — AB (ref 70–99)
Potassium: 3.9 mmol/L (ref 3.5–5.1)
Sodium: 140 mmol/L (ref 135–145)

## 2018-11-19 MED ORDER — TORSEMIDE 20 MG PO TABS: 20.0000 mg | ORAL_TABLET | Freq: Two times a day (BID) | ORAL | 0 refills | Status: DC

## 2018-11-19 MED ORDER — GUAIFENESIN ER 600 MG PO TB12: 600.0000 mg | ORAL_TABLET | Freq: Two times a day (BID) | ORAL | 0 refills | Status: DC

## 2018-11-19 MED ORDER — PREDNISONE 10 MG (21) PO TBPK: ORAL_TABLET | ORAL | 0 refills | Status: DC

## 2018-11-19 MED ORDER — DOXYCYCLINE HYCLATE 100 MG PO TABS: 100.0000 mg | ORAL_TABLET | Freq: Two times a day (BID) | ORAL | 0 refills | Status: DC

## 2018-11-19 NOTE — Consult Note (Signed)
[facility] Consultation Note  Patient ID: Chelsea Ramsey, MRN: [id number], DOB/AGE: 1966/03/24 52 y.o. Admit date: 11/17/2018   Date of Consult: 11/19/2018 Primary Physician: Kathee Delton, MD Primary Cardiologist: [facility]  Chief Complaint:  Chief Complaint  Patient presents with  . Shortness of Breath  . Hypotension  . Chest Pain   Reason for Consult: Shortness of breath  HPI: 52 y.o. female with known chronic obstructive pulmonary disease essential hypertension mixed hyperlipidemia and previous mitral valve replacement with atrial fibrillation having acute on chronic diastolic dysfunction congestive heart failure with chest x-ray showing pulmonary edema.  Currently the patient has had an EKG showing normal sinus rhythm and has had improvements in lung function as well as oxygenation with intravenous Lasix.  There currently is no evidence of chronic kidney disease but the patient does complain that she is gaining weight and also is concerned with some abdominal distention.  It is unclear whether this abdominal distention is significantly new or issues currently the patient does not have any new signs of pulmonary edema by exam.  She is hemodynamically stable  Past Medical History:  Diagnosis Date  . Allergy    seasonal  . Anxiety   . Arthritis    Right Knee  . Asthma   . CHF (congestive heart failure) ([place])   . COPD (chronic obstructive pulmonary disease) ([place])   . Coronary artery disease    Leaky heart valve  . Fibromyalgia   . GERD (gastroesophageal reflux disease)   . Hypertension   . Pneumonia   . PUD (peptic ulcer disease)   . Pulmonary HTN ([place])   . Rheumatic fever/heart disease   . Sleep apnea       Surgical History:  Past Surgical History:  Procedure Laterality Date  . ADENOIDECTOMY    . ESOPHAGOGASTRODUODENOSCOPY (EGD) WITH PROPOFOL N/A 05/25/2018   Procedure: ESOPHAGOGASTRODUODENOSCOPY (EGD) WITH PROPOFOL;  Surgeon: Lucilla Lame, MD;   Location: [facility] ENDOSCOPY;  Service: Endoscopy;  Laterality: N/A;  . MITRAL VALVE REPLACEMENT    . MULTIPLE TOOTH EXTRACTIONS    . TONSILLECTOMY    . TOTAL KNEE ARTHROPLASTY Right 09/04/2016   Procedure: TOTAL KNEE ARTHROPLASTY; with lateral release;  Surgeon: Earlie Server, MD;  Location: [place];  Service: Orthopedics;  Laterality: Right;     Home Meds: Prior to Admission medications   Medication Sig Start Date End Date Taking? Authorizing Provider  bisacodyl (DULCOLAX) 5 MG EC tablet Take 2 tablets (10 mg total) by mouth daily as needed for moderate constipation. 07/21/18  Yes Epifanio Lesches, MD  budesonide-formoterol (SYMBICORT) 160-4.5 MCG/ACT inhaler Inhale 2 puffs into the lungs 2 (two) times daily. 02/26/16  Yes [provider]  bumetanide (BUMEX) 2 MG tablet Take 2 tablets (4 mg total) by mouth daily. 07/22/18  Yes Epifanio Lesches, MD  Calcium Carbonate-Vit D-Min (GNP CALCIUM 1200) 1200-1000 MG-UNIT CHEW Chew 1,200 mg by mouth daily with breakfast. Take in combination with vitamin D and magnesium. 11/14/18 05/13/19 Yes Milinda Pointer, MD  Cholecalciferol (VITAMIN D3) 125 MCG (5000 UT) CAPS Take 1 capsule (5,000 Units total) by mouth daily with breakfast. Take along with calcium and magnesium. 11/14/18 05/13/19 Yes Milinda Pointer, MD  ergocalciferol (VITAMIN D2) 1.25 MG (50000 UT) capsule Take 1 capsule (50,000 Units total) by mouth 2 (two) times a week. X 6 weeks. 11/14/18 12/26/18 Yes Milinda Pointer, MD  gabapentin (NEURONTIN) 300 MG capsule Take 600 mg by mouth 3 (three) times daily.  07/22/15  Yes [provider]  ipratropium-albuterol (DUONEB) 0.5-2.5 (3) MG/3ML SOLN Inhale 3 mLs into the lungs 2 (two) times daily as needed (respiratory).  02/26/16 12/20/18 Yes [provider]  lactulose (CHRONULAC) 10 GM/15ML solution Take 45 mLs (30 g total) by mouth 2 (two) times daily as needed for mild constipation. 07/21/18  Yes Epifanio Lesches, MD   Magnesium 500 MG CAPS Take 1 capsule (500 mg total) by mouth 2 (two) times daily at 8 am and 10 pm. 11/14/18 05/13/19 Yes Milinda Pointer, MD  magnesium oxide (MAG-OX) 400 MG tablet Take 400 mg by mouth 2 (two) times daily. 07/11/18 07/11/19 Yes [provider]  meclizine (ANTIVERT) 12.5 MG tablet Take 25 mg by mouth 3 (three) times daily as needed for dizziness.   Yes [provider]  metoprolol tartrate (LOPRESSOR) 100 MG tablet Take 1 tablet (100 mg total) by mouth 2 (two) times daily. 11/05/18  Yes Mody, Ulice Bold, MD  nicotine (NICODERM CQ - DOSED IN MG/24 HR) 7 mg/24hr patch Place 1 patch (7 mg total) onto the skin daily. 11/06/18  Yes Mody, Ulice Bold, MD  nitroGLYCERIN (NITROSTAT) 0.4 MG SL tablet Place 1 tablet (0.4 mg total) under the tongue every 5 (five) minutes as needed for chest pain. 11/05/18  Yes Mody, Ulice Bold, MD  omeprazole (PRILOSEC) 40 MG capsule Take 40 mg by mouth 2 (two) times daily.   Yes [provider]  Oxycodone HCl 10 MG TABS Take 0.5-1 tablets (5-10 mg total) by mouth 2 (two) times daily as needed. Must last 30 days. MAX.: 2/day 11/21/18 12/21/18 Yes Milinda Pointer, MD  promethazine (PHENERGAN) 12.5 MG tablet Take 1 tablet (12.5 mg total) by mouth every 6 (six) hours as needed for nausea or vomiting. 11/06/18  Yes Mody, Ulice Bold, MD  rivaroxaban (XARELTO) 20 MG TABS tablet Take 20 mg by mouth every evening.  07/11/18 07/11/19 Yes [provider]  tiZANidine (ZANAFLEX) 4 MG tablet Take 4 mg by mouth at bedtime.  08/10/18  Yes [provider]  butalbital-acetaminophen-caffeine (FIORICET, ESGIC) 50-325-40 MG tablet Take 1-2 tablets by mouth every 6 (six) hours as needed for headache. 10/11/18 10/11/19  Rudene Re, MD  topiramate (TOPAMAX) 25 MG tablet Take 25 mg by mouth 2 (two) times daily.  08/10/18   [provider]    Inpatient Medications:  . budesonide (PULMICORT) nebulizer solution  0.5 mg Nebulization BID  .  calcium-vitamin D  2 tablet Oral Q breakfast  . cholecalciferol  5,000 Units Oral Q breakfast  . doxycycline  100 mg Oral Q12H  . furosemide  40 mg Intravenous Q12H  . gabapentin  600 mg Oral TID  . guaiFENesin  600 mg Oral BID  . ipratropium-albuterol  3 mL Nebulization Q4H  . magnesium oxide  400 mg Oral BID AC & HS  . methylPREDNISolone (SOLU-MEDROL) injection  40 mg Intravenous Q12H  . metoprolol tartrate  100 mg Oral BID  . multivitamin with minerals  1 tablet Oral Daily  . nicotine  7 mg Transdermal Daily  . pantoprazole  40 mg Oral Daily  . rivaroxaban  20 mg Oral QPM  . sodium chloride flush  3 mL Intravenous Q12H  . tiZANidine  4 mg Oral QHS  . topiramate  25 mg Oral BID  . Vitamin D (Ergocalciferol)  50,000 Units Oral Once per day on Mon Thu   . sodium chloride      Allergies:  Allergies  Allergen Reactions  . Amiodarone Nausea And Vomiting  . Aspirin Swelling  . Flexeril [Cyclobenzaprine]  Swelling  . Trazamine [Trazodone & Diet Manage Prod] Nausea And Vomiting  . Codeine Rash  . Tramadol Rash    Social History   Socioeconomic History  . Marital status: Married    Spouse name: Not on file  . Number of children: Not on file  . Years of education: Not on file  . Highest education level: Not on file  Occupational History  . Not on file  Social Needs  . Financial resource strain: Not on file  . Food insecurity:    Worry: Not on file    Inability: Not on file  . Transportation needs:    Medical: Not on file    Non-medical: Not on file  Tobacco Use  . Smoking status: Current Every Day Smoker    Packs/day: 0.50    Types: Cigarettes  . Smokeless tobacco: Never Used  Substance and Sexual Activity  . Alcohol use: Not Currently    Alcohol/week: 3.0 standard drinks    Types: 3 Cans of beer per week    Comment: 16 oz per week  . 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 Sexual activity: Not on file   Lifestyle  . Physical activity:    Days per week: Not on file    Minutes per session: Not on file  . Stress: Not on file  Relationships  . Social connections:    Talks on phone: Not on file    Gets together: Not on file    Attends religious service: Not on file    Active member of club or organization: Not on file    Attends meetings of clubs or organizations: Not on file    Relationship status: Not on file  . Intimate partner violence:    Fear of current or ex partner: Not on file    Emotionally abused: Not on file    Physically abused: Not on file    Forced sexual activity: Not on file  Other Topics Concern  . Not on file  Social History Narrative  . Not on file     Family History  Problem Relation Age of Onset  . COPD Mother   . Cancer Mother        Bone  . Asthma Mother   . Congestive Heart Failure Father      Review of Systems Positive for shortness of breath abdominal distention Negative for: General:  chills, fever, night sweats or weight changes.  Cardiovascular: PND orthopnea syncope dizziness  Dermatological skin lesions rashes Respiratory: Cough congestion Urologic: Frequent urination urination at night and hematuria Abdominal: negative for nausea, vomiting, diarrhea, bright red blood per rectum, melena, or hematemesis Neurologic: negative for visual changes, and/or hearing changes  All other systems reviewed and are otherwise negative except as noted above.  Labs: Recent Labs    11/17/18 1400  TROPONINI <0.03   Lab Results  Component Value Date   WBC 8.0 11/18/2018   HGB 11.1 (L) 11/18/2018   HCT 37.2 11/18/2018   MCV 83.4 11/18/2018   PLT 263 11/18/2018    Recent Labs  Lab 11/19/18 0359  NA 140  K 3.9  CL 106  CO2 27  BUN 15  CREATININE 0.55  CALCIUM 8.8*  GLUCOSE 142*   Lab Results  Component Value Date   CHOL 96 04/03/2014   HDL 32 (A) 04/03/2014   LDLCALC 51 04/03/2014   TRIG 63 04/03/2014   No  results found for:  DDIMER  Radiology/Studies:  Dg Chest Port 1 View  Result Date: 11/17/2018 CLINICAL DATA:  Chest pain. EXAM: PORTABLE CHEST 1 VIEW COMPARISON:  CT scan and radiograph of November 04, 2018. FINDINGS: Stable cardiomegaly with central pulmonary vascular congestion. Minimal bilateral pulmonary edema may be present. Hypoinflation of the lungs is noted. Status post cardiac valve repair. No pneumothorax or pleural effusion is noted. Bony thorax is unremarkable. IMPRESSION: Stable cardiomegaly with central pulmonary vascular congestion and possible bilateral pulmonary edema. Hypoinflation of the lungs is noted. Electronically Signed   By: Marijo Conception, M.D.   On: 11/17/2018 14:55   Dg Chest Portable 1 View  Result Date: 11/04/2018 CLINICAL DATA:  Dizziness with fall this morning. LEFT-sided chest pain 2 days ago. Total knee replacement 2017. Cardiac ablation surgery earlier this year. Additional history of asthma, COPD, CHF, coronary artery disease, hypertension, current smoker. EXAM: PORTABLE CHEST 1 VIEW COMPARISON:  Chest x-rays dated 10/11/2018 12/20/2017. FINDINGS: Median sternotomy wires appear intact and stable in alignment. Mitral valve replacement hardware and atrial clip appear grossly stable in position. Stable mild cardiomegaly. Mild peribronchial cuffing and subtle interstitial thickening again noted bilaterally suggesting chronic mild CHF. No evidence of overt alveolar pulmonary edema. No evidence of superimposed pneumonia. No pleural effusion or pneumothorax seen. No acute or suspicious osseous finding. IMPRESSION: 1. No active disease. No evidence of pneumonia or pulmonary edema. 2. Stable mild cardiomegaly. 3. Probable chronic mild CHF. Electronically Signed   By: Franki Cabot M.D.   On: 11/04/2018 11:19   Ct Angio Chest/abd/pel For Dissection W And/or Wo Contrast  Result Date: 11/04/2018 CLINICAL DATA:  52 year old female with a 2 day history of chest abdominal pain accompanied by presyncope.  Clinical history includes atrial fibrillation and prior cardiac surgeries. EXAM: CT ANGIOGRAPHY CHEST, ABDOMEN AND PELVIS TECHNIQUE: Multidetector CT imaging through the chest, abdomen and pelvis was performed using the standard protocol during bolus administration of intravenous contrast. Multiplanar reconstructed images and MIPs were obtained and reviewed to evaluate the vascular anatomy. CONTRAST:  136mL ISOVUE-370 IOPAMIDOL (ISOVUE-370) INJECTION 76% COMPARISON:  Most recent prior CT scan of the chest 07/17/2018; MRI lumbar spine 01/07/2018; prior chest CT 12/08/2013 FINDINGS: CTA CHEST FINDINGS Cardiovascular: Initial unenhanced CT imaging demonstrates no evidence of acute intramural hematoma. Following the administration of IV contrast, the thoracic aorta and pulmonary artery are both well opacified. Conventional 3 vessel arch anatomy. The aortic root, ascending, transverse and descending thoracic aorta are all normal in caliber. The main pulmonary artery is normal in caliber. No evidence of central filling defect to suggest acute PE. Surgical changes consistent with prior left atrial appendage ligation and mitral valvuloplasty. Mild cardiomegaly with biatrial enlargement. No pericardial effusion. Mediastinum/Nodes: Stable strandy soft tissue in the anterior mediastinum either representing the sequelae of prior surgical intervention, or residual thymic tissue. No significant interval change compared 07/17/2018. Unremarkable thoracic esophagus. No suspicious lymphadenopathy. Lungs/Pleura: Stable scattered bilateral calcified and noncalcified granulomas dating back to at least January of 2015. Additionally, there is a pattern diffuse reticulonodular airspace opacities predominantly in the bilateral upper lungs which also remains unchanged across several prior studies dating back to January of 2015. No definite acute airspace opacification, pulmonary edema, pleural effusion or pneumothorax. Stable mild diffuse  bronchial wall thickening. Mild centrilobular pulmonary emphysema. Musculoskeletal: No acute fracture or aggressive appearing lytic or blastic osseous lesion. Review of the MIP images confirms the above findings. CTA ABDOMEN AND PELVIS FINDINGS VASCULAR Aorta: Normal caliber aorta without aneurysm, dissection,  vasculitis or significant stenosis. Celiac: Patent without evidence of aneurysm, dissection, vasculitis or significant stenosis. SMA: Patent without evidence of aneurysm, dissection, vasculitis or significant stenosis. Renals: Both renal arteries are patent without evidence of aneurysm, dissection, vasculitis, fibromuscular dysplasia or significant stenosis. IMA: Patent without evidence of aneurysm, dissection, vasculitis or significant stenosis. Inflow: Patent without evidence of aneurysm, dissection, vasculitis or significant stenosis. Veins: No obvious venous abnormality within the limitations of this arterial phase study. Review of the MIP images confirms the above findings. NON-VASCULAR Hepatobiliary: Normal hepatic contour and morphology. Diffuse low attenuation of the hepatic parenchyma suggestive of underlying steatosis. No discrete hepatic lesions. Normal appearance of the gallbladder. No intra or extrahepatic biliary ductal dilatation. Pancreas: Unremarkable. No pancreatic ductal dilatation or surrounding inflammatory changes. Spleen: Normal in size without focal abnormality. Adrenals/Urinary Tract: Adrenal glands are unremarkable. Kidneys are normal, without renal calculi, focal lesion, or hydronephrosis. Bladder is unremarkable. Stomach/Bowel: Colonic diverticular disease without CT evidence of active inflammation. No evidence of obstruction or focal bowel wall thickening. Normal appendix in the right lower quadrant. The terminal ileum is unremarkable. Lymphatic: No suspicious lymphadenopathy. Reproductive: Uterus and bilateral adnexa are unremarkable. Other: No abdominal wall hernia or  abnormality. No abdominopelvic ascites. Musculoskeletal: No acute fracture or aggressive appearing lytic or blastic osseous lesion. Review of the MIP images confirms the above findings. IMPRESSION: CTA CHEST 1. Negative for acute vascular abnormality, central pulmonary embolus, pneumonia or other acute cardiopulmonary process. 2. Stable chronic bilateral reticulonodular airspace opacities and underlying emphysema. Differential considerations include chronic hypersensitivity pneumonitis, sarcoidosis, and sequelae of old granulomatous disease superimposed on mild centrilobular emphysema. 3. Stable bilateral benign pulmonary granulomas. 4. Stable mild cardiomegaly with biatrial enlargement. 5. Surgical changes of prior mitral valve annuloplasty. CTA ABD/PELVIS 1. No acute vascular abnormality within the abdomen or pelvis. 2. Hepatic steatosis. 3. Colonic diverticular disease without CT evidence of active inflammation. Electronically Signed   By: Jacqulynn Cadet M.D.   On: 11/04/2018 13:22   Dg Femur Min 2 Views Right  Result Date: 11/04/2018 CLINICAL DATA:  Fall this morning, dizziness. RIGHT upper leg pain. EXAM: RIGHT FEMUR 2 VIEWS COMPARISON:  None. FINDINGS: RIGHT knee arthroplasty hardware appears intact and appropriately position. Osseous alignment is normal. No fracture line or displaced fracture fragment seen. No appreciable joint effusion at the RIGHT knee joint. Soft tissues about the RIGHT femur are unremarkable. IMPRESSION: 1. Negative. 2. Hardware intact. Electronically Signed   By: Franki Cabot M.D.   On: 11/04/2018 11:16    EKG: Normal sinus rhythm  Weights: Filed Weights   11/18/18 0525 11/19/18 0426 11/19/18 0451  Weight: 120.2 kg 121.9 kg 119.7 kg     Physical Exam: Blood pressure 99/68, pulse 91, temperature 98.4 F (36.9 C), temperature source Oral, resp. rate 18, height 5\' 4"  (1.626 m), weight 119.7 kg, last menstrual period 11/08/2009, SpO2 100 %. Body mass index is 45.3  kg/m. General: Well developed, well nourished, in no acute distress. Head eyes ears nose throat: Normocephalic, atraumatic, sclera non-icteric, no xanthomas, nares are without discharge. No apparent thyromegaly and/or mass  Lungs: Normal respiratory effort.  no wheezes, no rales, no rhonchi.  Heart: RRR with normal S1 S2. no murmur gallop, no rub, PMI is normal size and placement, carotid upstroke normal without bruit, jugular venous pressure is normal Abdomen: Soft, non-tender, distended with normoactive bowel sounds. No hepatomegaly. No rebound/guarding. No obvious abdominal masses. Abdominal aorta is normal size without bruit Extremities: Plus edema. no cyanosis, no clubbing Peripheral : 2+  bilateral upper extremity pulses, 2+ bilateral femoral pulses, 2+ bilateral dorsal pedal pulse Neuro: Alert and oriented. No facial asymmetry. No focal deficit. Moves all extremities spontaneously. Musculoskeletal: Normal muscle tone without kyphosis Psych:  Responds to questions appropriately with a normal affect.    Assessment: 52 year old female with back acute on chronic diastolic dysfunction congestive heart failure improved with intravenous Lasix without evidence of myocardial infarction having abdominal distention needing medication management   Plan: 1.  Continue Lasix intravenously for pulmonary edema and/or hypoxia and change over to torsemide 20 to 40 mg orally when discharged home to improve bioavailability and effectiveness with abdominal distention and possible bowel edema 2.  Continue metoprolol for heart rate control and diastolic dysfunction 3.  No further cardiac diagnostics necessary at this time 3.  Begin ambulation follow-up for hypoxia and other symptoms and okay for discharge home from cardiac standpoint with follow-up in next week  Signed, Corey Skains M.D. [facility] 11/19/2018, 10:57 AM

## 2018-11-19 NOTE — Progress Notes (Signed)
Pt discharged home today after many concerns for inadequate treatment of excessive fluid retention. IV Removed, pt met with cardiology, care management, and VSS. Pt educated on follow up appointments and new medications as well as medication regiment at this time.

## 2018-11-19 NOTE — Discharge Summary (Signed)
[facility] [place] NAME: Jessabelle Markiewicz    MR#:  [id number]  DATE OF BIRTH:  Dec 14, 1965  DATE OF ADMISSION:  11/17/2018 ADMITTING PHYSICIAN: Dustin Flock, MD  DATE OF DISCHARGE: No discharge date for patient encounter.  PRIMARY CARE PHYSICIAN: Kathee Delton, MD    ADMISSION DIAGNOSIS:  COPD exacerbation ([place]) [J44.1] Acute respiratory failure with hypoxia ([place]) [J96.01] Chest pain [R07.9]  DISCHARGE DIAGNOSIS:  Active Problems:   Acute CHF (congestive heart failure) ([place])   SECONDARY DIAGNOSIS:   Past Medical History:  Diagnosis Date  . Allergy    seasonal  . Anxiety   . Arthritis    Right Knee  . Asthma   . CHF (congestive heart failure) ([place])   . COPD (chronic obstructive pulmonary disease) ([place])   . Coronary artery disease    Leaky heart valve  . Fibromyalgia   . GERD (gastroesophageal reflux disease)   . Hypertension   . Pneumonia   . PUD (peptic ulcer disease)   . Pulmonary HTN ([place])   . Rheumatic fever/heart disease   . Sleep apnea     HOSPITAL COURSE:  52 year old female with a history of chronic diastolic heart failure and COPD who presented to the hospital due to shortness of breath  1.  Acute hypoxic respiratory failure on chronic hypoxic respiratory failure due to COPD exacerbation CHF exacerbation Resolved Successfully weaned back to her baseline O2 requirement of 2-3 L via nasal cannula continuous Patient noted to be 100% sat on 2 L of oxygen with lungs clear on day of discharge  2.  Acute on chronic diastolic heart failure: Resolved  Treated on our congestive heart failure protocol, cardiology did see patient while in house, diuretics were changed to torsemide, patient follow cardiology status post discharge within the next week for reevaluation, patient referred to congestive heart failure clinic as well as for cardiopulmonary rehab status post discharge   3.  Acute exacerbation  of COPD with acute bronchitis: Resolved Treated with steroid taper, empiric doxycycline, breathing treatments PRN  4.Tobacco dependence: Stable patient was encouraged to quit smoking Counseling was provided for 4 minutes  5.  Essential hypertension Continue metoprolol  6.  Hypokalemia Repleted  Difficult discharge on the day of discharge, cardiology had to be consulted to convince patient was stable enough for discharge to home, case discussed extensively with nursing supervising staff as well as social services/case management   DISCHARGE CONDITIONS:   stable  CONSULTS OBTAINED:  Treatment Team:  Chauncey Mann, MD Corey Skains, MD  DRUG ALLERGIES:   Allergies  Allergen Reactions  . Amiodarone Nausea And Vomiting  . Aspirin Swelling  . Flexeril [Cyclobenzaprine] Swelling  . Trazamine [Trazodone & Diet Manage Prod] Nausea And Vomiting  . Codeine Rash  . Tramadol Rash    DISCHARGE MEDICATIONS:   Allergies as of 11/19/2018      Reactions   Amiodarone Nausea And Vomiting   Aspirin Swelling   Flexeril [cyclobenzaprine] Swelling   Trazamine [trazodone & Diet Manage Prod] Nausea And Vomiting   Codeine Rash   Tramadol Rash      Medication List    STOP taking these medications   bumetanide 2 MG tablet Commonly known as:  BUMEX     TAKE these medications   bisacodyl 5 MG EC tablet Commonly known as:  DULCOLAX Take 2 tablets (10 mg total) by mouth daily as needed for moderate constipation.   butalbital-acetaminophen-caffeine 50-325-40 MG tablet  Commonly known as:  FIORICET, ESGIC Take 1-2 tablets by mouth every 6 (six) hours as needed for headache.   doxycycline 100 MG tablet Commonly known as:  VIBRA-TABS Take 1 tablet (100 mg total) by mouth every 12 (twelve) hours.   ergocalciferol 1.25 MG (50000 UT) capsule Commonly known as:  VITAMIN D2 Take 1 capsule (50,000 Units total) by mouth 2 (two) times a week. X 6 weeks.   gabapentin 300 MG  capsule Commonly known as:  NEURONTIN Take 600 mg by mouth 3 (three) times daily.   GNP CALCIUM 1200 1200-1000 MG-UNIT Chew Chew 1,200 mg by mouth daily with breakfast. Take in combination with vitamin D and magnesium.   guaiFENesin 600 MG 12 hr tablet Commonly known as:  MUCINEX Take 1 tablet (600 mg total) by mouth 2 (two) times daily.   ipratropium-albuterol 0.5-2.5 (3) MG/3ML Soln Commonly known as:  DUONEB Inhale 3 mLs into the lungs 2 (two) times daily as needed (respiratory).   lactulose 10 GM/15ML solution Commonly known as:  CHRONULAC Take 45 mLs (30 g total) by mouth 2 (two) times daily as needed for mild constipation.   Magnesium 500 MG Caps Take 1 capsule (500 mg total) by mouth 2 (two) times daily at 8 am and 10 pm.   magnesium oxide 400 MG tablet Commonly known as:  MAG-OX Take 400 mg by mouth 2 (two) times daily.   meclizine 12.5 MG tablet Commonly known as:  ANTIVERT Take 25 mg by mouth 3 (three) times daily as needed for dizziness.   metoprolol tartrate 100 MG tablet Commonly known as:  LOPRESSOR Take 1 tablet (100 mg total) by mouth 2 (two) times daily.   nicotine 7 mg/24hr patch Commonly known as:  NICODERM CQ - dosed in mg/24 hr Place 1 patch (7 mg total) onto the skin daily.   nitroGLYCERIN 0.4 MG SL tablet Commonly known as:  NITROSTAT Place 1 tablet (0.4 mg total) under the tongue every 5 (five) minutes as needed for chest pain.   omeprazole 40 MG capsule Commonly known as:  PRILOSEC Take 40 mg by mouth 2 (two) times daily.   Oxycodone HCl 10 MG Tabs Take 0.5-1 tablets (5-10 mg total) by mouth 2 (two) times daily as needed. Must last 30 days. MAX.: 2/day Start taking on:  November 21, 2018   predniSONE 10 MG (21) Tbpk tablet Commonly known as:  STERAPRED UNI-PAK 21 TAB Take as directed   promethazine 12.5 MG tablet Commonly known as:  PHENERGAN Take 1 tablet (12.5 mg total) by mouth every 6 (six) hours as needed for nausea or vomiting.    rivaroxaban 20 MG Tabs tablet Commonly known as:  XARELTO Take 20 mg by mouth every evening.   SYMBICORT 160-4.5 MCG/ACT inhaler Generic drug:  budesonide-formoterol Inhale 2 puffs into the lungs 2 (two) times daily.   tiZANidine 4 MG tablet Commonly known as:  ZANAFLEX Take 4 mg by mouth at bedtime.   topiramate 25 MG tablet Commonly known as:  TOPAMAX Take 25 mg by mouth 2 (two) times daily.   torsemide 20 MG tablet Commonly known as:  DEMADEX Take 1 tablet (20 mg total) by mouth 2 (two) times daily.   Vitamin D3 125 MCG (5000 UT) Caps Take 1 capsule (5,000 Units total) by mouth daily with breakfast. Take along with calcium and magnesium.        DISCHARGE INSTRUCTIONS:    If you experience worsening of your admission symptoms, develop shortness of breath, life threatening  emergency, suicidal or homicidal thoughts you must seek medical attention immediately by calling 911 or calling your MD immediately  if symptoms less severe.  You Must read complete instructions/literature along with all the possible adverse reactions/side effects for all the Medicines you take and that have been prescribed to you. Take any new Medicines after you have completely understood and accept all the possible adverse reactions/side effects.   Please note  You were cared for by a hospitalist during your hospital stay. If you have any questions about your discharge medications or the care you received while you were in the hospital after you are discharged, you can call the unit and asked to speak with the hospitalist on call if the hospitalist that took care of you is not available. Once you are discharged, your primary care physician will handle any further medical issues. Please note that NO REFILLS for any discharge medications will be authorized once you are discharged, as it is imperative that you return to your primary care physician (or establish a relationship with a primary care physician if  you do not have one) for your aftercare needs so that they can reassess your need for medications and monitor your lab values.    Today   CHIEF COMPLAINT:   Chief Complaint  Patient presents with  . Shortness of Breath  . Hypotension  . Chest Pain    HISTORY OF PRESENT ILLNESS:  52 y.o. female with a known history of chronic diastolic CHF, COPD, and persistent nicotine abuse who is presenting to the hospital with complaint of shortness of breath cough congestion and wheezing.  Patient states that she started getting short of breath 2 days ago and has progressively gotten worse.  When she went to the congestive heart failure clinic she was noted to have 10 pound weight gain.  She also has been wheezing and having dry cough.  Chest x-ray suggestive of CHF as well as concern for COPD    VITAL SIGNS:  Blood pressure 99/68, pulse 91, temperature 98.4 F (36.9 C), temperature source Oral, resp. rate 18, height 5\' 4"  (1.626 m), weight 119.7 kg, last menstrual period 11/08/2009, SpO2 100 %.  I/O:    Intake/Output Summary (Last 24 hours) at 11/19/2018 1346 Last data filed at 11/19/2018 1346 Gross per 24 hour  Intake 240 ml  Output 2200 ml  Net -1960 ml    PHYSICAL EXAMINATION:  GENERAL:  52 y.o.-year-old patient lying in the bed with no acute distress.  EYES: Pupils equal, round, reactive to light and accommodation. No scleral icterus. Extraocular muscles intact.  HEENT: Head atraumatic, normocephalic. Oropharynx and nasopharynx clear.  NECK:  Supple, no jugular venous distention. No thyroid enlargement, no tenderness.  LUNGS: Normal breath sounds bilaterally, no wheezing, rales,rhonchi or crepitation. No use of accessory muscles of respiration.  CARDIOVASCULAR: S1, S2 normal. No murmurs, rubs, or gallops.  ABDOMEN: Soft, non-tender, non-distended. Bowel sounds present. No organomegaly or mass.  EXTREMITIES: No pedal edema, cyanosis, or clubbing.  NEUROLOGIC: Cranial nerves II  through XII are intact. Muscle strength 5/5 in all extremities. Sensation intact. Gait not checked.  PSYCHIATRIC: The patient is alert and oriented x 3.  SKIN: No obvious rash, lesion, or ulcer.   DATA REVIEW:   CBC Recent Labs  Lab 11/18/18 0340  WBC 8.0  HGB 11.1*  HCT 37.2  PLT 263    Chemistries  Recent Labs  Lab 11/19/18 0359  NA 140  K 3.9  CL 106  CO2 27  GLUCOSE 142*  BUN 15  CREATININE 0.55  CALCIUM 8.8*    Cardiac Enzymes Recent Labs  Lab 11/17/18 1400  TROPONINI <0.03    Microbiology Results  Results for orders placed or performed during the hospital encounter of 07/17/18  Urine Culture     Status: Abnormal   Collection Time: 07/17/18  3:58 PM  Result Value Ref Range Status   Specimen Description   Final    URINE, CLEAN CATCH Performed at [facility], [address], [place], [place] [address]    Special Requests   Final    Normal Performed at [facility], [address] [place], [place] [address]    Culture MULTIPLE SPECIES PRESENT, SUGGEST RECOLLECTION (A)  Final   Report Status 07/19/2018 FINAL  Final    RADIOLOGY:  Dg Chest Port 1 View  Result Date: 11/19/2018 CLINICAL DATA:  Shortness of breath, mitral valve replacement EXAM: PORTABLE CHEST 1 VIEW COMPARISON:  11/17/2018 FINDINGS: Stable postop changes of the heart. Similar cardiomegaly with vascular congestion. No definite focal pneumonia, collapse or consolidation. No large effusion or pneumothorax. No current CHF. Improved lung volumes compared 11/17/2018. IMPRESSION: Cardiomegaly with vascular congestion.  Improved lung volumes. Electronically Signed   By: Jerilynn Mages.  Shick M.D.   On: 11/19/2018 11:50   Dg Chest Port 1 View  Result Date: 11/17/2018 CLINICAL DATA:  Chest pain. EXAM: PORTABLE CHEST 1 VIEW COMPARISON:  CT scan and radiograph of November 04, 2018. FINDINGS: Stable cardiomegaly with central pulmonary vascular congestion. Minimal bilateral pulmonary edema may  be present. Hypoinflation of the lungs is noted. Status post cardiac valve repair. No pneumothorax or pleural effusion is noted. Bony thorax is unremarkable. IMPRESSION: Stable cardiomegaly with central pulmonary vascular congestion and possible bilateral pulmonary edema. Hypoinflation of the lungs is noted. Electronically Signed   By: Marijo Conception, M.D.   On: 11/17/2018 14:55    EKG:   Orders placed or performed during the hospital encounter of 11/17/18  . ED EKG within 10 minutes  . ED EKG within 10 minutes      Management plans discussed with the patient, family and they are in agreement.  CODE STATUS:     Code Status Orders  (From admission, onward)         Start     Ordered   11/17/18 1954  Full code  Continuous     11/17/18 1953        Code Status History    Date Active Date Inactive Code Status Order ID Comments User Context   11/04/2018 1601 11/06/2018 1358 Full Code [id number]  Nicholes Mango, MD Inpatient   05/22/2018 1222 05/25/2018 2208 Full Code [id number]  Demetrios Loll, MD Inpatient   04/22/2018 1440 04/25/2018 1736 Full Code [id number]  Epifanio Lesches, MD ED   12/20/2017 1946 12/21/2017 1447 Full Code [id number]  Gorden Harms, MD ED   09/04/2016 1449 09/05/2016 1715 Full Code [id number]  Chriss Czar, PA-C Inpatient      TOTAL TIME TAKING CARE OF THIS PATIENT: 45 minutes.    Avel Peace Dalaina Tates M.D on 11/19/2018 at 1:46 PM  Between 7am to 6pm - Pager - [phone]  After 6pm go to [email or web address] - password [email or web address] [email or web address]  Office  [phone]  CC: Primary care physician; Kathee Delton, MD   Note: This dictation was prepared with Dragon dictation along with smaller phrase technology. Any transcriptional errors that result from this process are unintentional.

## 2018-11-19 NOTE — Plan of Care (Signed)
  Problem: Education: Goal: Knowledge of General Education information will improve Description Including pain rating scale, medication(s)/side effects and non-pharmacologic comfort measures Outcome: Progressing   Problem: Clinical Measurements: Goal: Respiratory complications will improve Outcome: Progressing   Problem: Activity: Goal: Risk for activity intolerance will decrease Outcome: Progressing   Problem: Elimination: Goal: Will not experience complications related to bowel motility Outcome: Progressing Goal: Will not experience complications related to urinary retention Outcome: Progressing   Problem: Safety: Goal: Ability to remain free from injury will improve Outcome: Progressing   Problem: Activity: Goal: Capacity to carry out activities will improve Outcome: Progressing

## 2018-11-19 NOTE — Plan of Care (Signed)
  Problem: Education: Goal: Knowledge of General Education information will improve Description Including pain rating scale, medication(s)/side effects and non-pharmacologic comfort measures Outcome: Progressing   Problem: Health Behavior/Discharge Planning: Goal: Ability to manage health-related needs will improve Outcome: Progressing   Problem: Activity: Goal: Risk for activity intolerance will decrease Outcome: Progressing   Problem: Education: Goal: Ability to demonstrate management of disease process will improve Outcome: Progressing Goal: Ability to verbalize understanding of medication therapies will improve Outcome: Progressing

## 2018-11-19 NOTE — Consult Note (Signed)
[facility] Face-to-Face Psychiatry Consult   Reason for Consult:  Anxiety Referring Physician:  Loney Hering, MD Patient Identification: Chelsea Ramsey MRN:  [id number] Principal Diagnosis: None  Diagnosis:  Active Problems:   Acute CHF (congestive heart failure) ([place])   Total Time spent with patient: 45 minutes  Subjective:   Chelsea Ramsey is a 52 y.o. female patient admitted with CHF exacerbation.  Psychiatry was consulted for "anxiety".  When this writer arrived to examine the patient, the nurse informed this Probation officer that a psychiatry consult was called because the patient and her primary physician in the hospital had a verbal altercation.  The patient's nurse was in the room during the time of the altercation.  The patient reports that hospitalist was extremely insulting to her, accusing her of being homeless and got extremely emotional with her.  The patient felt that the hospitalist "made her feel like scum".  She was heartbroken that he was not willing to address the issue of her increased weight gain since her admission and feeling like she had fluid in her stomach.  The patient felt like he was talking down to her and was not allowing her to ask any questions.  The nurse provided collateral information indicating that the hospitalist had been disrespectful and different to the patient's concerns.   The patient herself denies any current major mood symptoms including problems with depression or anxiety.  She denies any history of any depressive symptoms in the past.  She denies any prior inpatient psychiatric hospitalizations or suicide attempts.  She denies any feelings of hopelessness, anhedonia or frequent crying spells.  She denies any history of problems with anger in the past and has not ever seen a psychiatrist or therapist.  She has never been on any psychotropic medications for anxiety or depression in the past.  She denies any history of symptoms consistent with bipolar mania  including grandiose delusions, racing thoughts or decreased sleep with increased overall behavior.  She denies any history of any irritability or anger outburst.  She denies any history of any psychosis including auditory or visual hallucinations.  No paranoid thoughts or delusions.  The patient denies any problems with insomnia or change in appetite, weight gain or weight loss.  The patient denies any heavy alcohol use or illicit drug use.  The patient is currently married but on disability secondary to medical reasons.  She has worked as a Quarry manager in the past.  Past psychiatric history: The patient denies any prior inpatient psychiatric hospitalizations or suicide attempts  Substance abuse history: The patient denies any history of any heavy alcohol use or illicit drug use.  He does smoke 2 to 3 cigarettes/day for over 30 years.  Family psychiatric history: Patient denies any history of any mental illness or substance use in the family.  Social history: The patient is currently married and has 1 daughter who was stillborn.  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 Allergy    seasonal  . Anxiety   . Arthritis    Right Knee  . Asthma   . CHF (congestive heart failure) ([place])   . COPD (chronic obstructive pulmonary disease) ([place])   . Coronary artery disease    Leaky heart valve  . Fibromyalgia   .  GERD (gastroesophageal reflux disease)   . Hypertension   . Pneumonia   . PUD (peptic ulcer disease)   . Pulmonary HTN ([place])   . Rheumatic fever/heart disease   . Sleep apnea     Past Surgical History:  Procedure Laterality Date  . ADENOIDECTOMY    . ESOPHAGOGASTRODUODENOSCOPY (EGD) WITH PROPOFOL N/A 05/25/2018   Procedure: ESOPHAGOGASTRODUODENOSCOPY (EGD) WITH PROPOFOL;  Surgeon: Lucilla Lame, MD;  Location: [facility];  Service: Endoscopy;  Laterality: N/A;  . MITRAL VALVE REPLACEMENT    . MULTIPLE TOOTH EXTRACTIONS    . TONSILLECTOMY    . TOTAL KNEE ARTHROPLASTY Right 09/04/2016   Procedure: TOTAL KNEE ARTHROPLASTY; with lateral release;  Surgeon: Earlie Server, MD;  Location: [facility];  Service: Orthopedics;  Laterality: Right;   Family History:  Family History  Problem Relation Age of Onset  . COPD Mother   . Cancer Mother        Bone  . Asthma Mother   . Congestive Heart Failure Father     Social History:  Social History   Substance and Sexual Activity  Alcohol Use Not Currently  . Alcohol/week: 3.0 standard drinks  . Types: 3 Cans of beer per week   Comment: 16 oz per week     Social History   Substance and Sexual Activity  Drug Use Not Currently   Comment: Previous use of cocaine and marijuana last use 07/31/16     Social History   Socioeconomic History  . Marital status: Married    Spouse name: Not on file  . Number of children: Not on file  . Years of education: Not on file  . Highest education level: Not on file  Occupational History  . Not on file  Social Needs  . Financial resource strain: Not on file  . Food insecurity:    Worry: Not on file    Inability: Not on file  . Transportation needs:    Medical: Not on file    Non-medical: Not on file  Tobacco Use  . Smoking status: Current Every Day Smoker    Packs/day: 0.50    Types: Cigarettes  . Smokeless tobacco: Never Used  Substance and Sexual Activity  . Alcohol use: Not Currently    Alcohol/week: 3.0 standard drinks    Types: 3 Cans of beer per week    Comment: 16 oz per week  . 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 Sexual activity: Not on file  Lifestyle  . Physical activity:    Days per week: Not on file    Minutes per session: Not on file  . Stress: Not on file  Relationships  . Social connections:    Talks  on phone: Not on file    Gets together: Not on file    Attends religious service: Not on file    Active member of club or organization: Not on file    Attends meetings of clubs or organizations: Not on file    Relationship status: Not on file  Other Topics Concern  . Not on file  Social History Narrative  . Not on file   Additional Social History:    Allergies:   Allergies  Allergen Reactions  . Amiodarone Nausea And Vomiting  . Aspirin Swelling  . Flexeril [Cyclobenzaprine] Swelling  . Trazamine [Trazodone & Diet Manage Prod] Nausea And Vomiting  . Codeine Rash  . Tramadol Rash  Labs:  Results for orders placed or performed during the hospital encounter of 11/17/18 (from the past 48 hour(s))  Influenza panel by PCR (type A & B)     Status: None   Collection Time: 11/17/18  2:41 PM  Result Value Ref Range   Influenza A By PCR NEGATIVE NEGATIVE   Influenza B By PCR NEGATIVE NEGATIVE    Comment: (NOTE) The Xpert Xpress Flu assay is intended as an aid in the diagnosis of  influenza and should not be used as a sole basis for treatment.  This  assay is FDA approved for nasopharyngeal swab specimens only. Nasal  washings and aspirates are unacceptable for Xpert Xpress Flu testing. Performed at [facility], [address] [place], [place] [address]   CBC     Status: Abnormal   Collection Time: 11/18/18  3:40 AM  Result Value Ref Range   WBC 8.0 4.0 - 10.5 K/uL   RBC 4.46 3.87 - 5.11 MIL/uL   Hemoglobin 11.1 (L) 12.0 - 15.0 g/dL   HCT 37.2 36.0 - 46.0 %   MCV 83.4 80.0 - 100.0 fL   MCH 24.9 (L) 26.0 - 34.0 pg   MCHC 29.8 (L) 30.0 - 36.0 g/dL   RDW 16.5 (H) 11.5 - 15.5 %   Platelets 263 150 - 400 K/uL   nRBC 0.0 0.0 - 0.2 %    Comment: Performed at [facility], [address] [place], [place] [address]  Basic metabolic panel     Status: Abnormal   Collection Time: 11/18/18  3:40 AM  Result Value Ref Range   Sodium 141 135 - 145 mmol/L    Potassium 3.4 (L) 3.5 - 5.1 mmol/L   Chloride 104 98 - 111 mmol/L   CO2 25 22 - 32 mmol/L   Glucose, Bld 223 (H) 70 - 99 mg/dL   BUN 22 (H) 6 - 20 mg/dL   Creatinine, Ser 0.79 0.44 - 1.00 mg/dL   Calcium 8.9 8.9 - 10.3 mg/dL   GFR calc non Af Amer >60 >60 mL/min   GFR calc Af Amer >60 >60 mL/min   Anion gap 12 5 - 15    Comment: Performed at [facility], [address] [place], [place] [address]  Basic metabolic panel     Status: Abnormal   Collection Time: 11/19/18  3:59 AM  Result Value Ref Range   Sodium 140 135 - 145 mmol/L   Potassium 3.9 3.5 - 5.1 mmol/L   Chloride 106 98 - 111 mmol/L   CO2 27 22 - 32 mmol/L   Glucose, Bld 142 (H) 70 - 99 mg/dL   BUN 15 6 - 20 mg/dL   Creatinine, Ser 0.55 0.44 - 1.00 mg/dL   Calcium 8.8 (L) 8.9 - 10.3 mg/dL   GFR calc non Af Amer >60 >60 mL/min   GFR calc Af Amer >60 >60 mL/min   Anion gap 7 5 - 15    Comment: Performed at [facility], [address] [place], [place] [address]    Current Facility-Administered Medications  Medication Dose Route Frequency Provider Last Rate Last Dose  . 0.9 %  sodium chloride infusion  250 mL Intravenous PRN Dustin Flock, MD      . acetaminophen (TYLENOL) tablet 650 mg  650 mg Oral Q6H PRN Dustin Flock, MD   650 mg at 11/19/18 0919   Or  . acetaminophen (TYLENOL) suppository 650 mg  650 mg Rectal Q6H PRN Dustin Flock,  MD      . albuterol (PROVENTIL) (2.5 MG/3ML) 0.083% nebulizer solution 2.5 mg  2.5 mg Nebulization Q2H PRN Arta Silence, MD   2.5 mg at 11/18/18 0224  . bisacodyl (DULCOLAX) EC tablet 10 mg  10 mg Oral Daily PRN Dustin Flock, MD   10 mg at 11/19/18 0919  . budesonide (PULMICORT) nebulizer solution 0.5 mg  0.5 mg Nebulization BID Arta Silence, MD   0.5 mg at 11/19/18 0729  . butalbital-acetaminophen-caffeine (FIORICET, ESGIC) 50-325-40 MG per tablet 1-2 tablet  1-2 tablet Oral Q6H PRN Dustin Flock, MD   2 tablet at 11/18/18 1645  .  calcium-vitamin D (OSCAL WITH D) 500-200 MG-UNIT per tablet 2 tablet  2 tablet Oral Q breakfast Dustin Flock, MD   2 tablet at 11/19/18 0919  . cholecalciferol (VITAMIN D) tablet 5,000 Units  5,000 Units Oral Q breakfast Dustin Flock, MD   5,000 Units at 11/19/18 0920  . doxycycline (VIBRA-TABS) tablet 100 mg  100 mg Oral Q12H Dustin Flock, MD   100 mg at 11/19/18 0919  . furosemide (LASIX) injection 40 mg  40 mg Intravenous Q12H Dustin Flock, MD   40 mg at 11/19/18 0610  . gabapentin (NEURONTIN) capsule 600 mg  600 mg Oral TID Dustin Flock, MD   600 mg at 11/19/18 0920  . guaiFENesin (MUCINEX) 12 hr tablet 600 mg  600 mg Oral BID Dustin Flock, MD   600 mg at 11/19/18 0919  . guaiFENesin-dextromethorphan (ROBITUSSIN DM) 100-10 MG/5ML syrup 5 mL  5 mL Oral Q4H PRN Bettey Costa, MD   5 mL at 11/19/18 0919  . ipratropium-albuterol (DUONEB) 0.5-2.5 (3) MG/3ML nebulizer solution 3 mL  3 mL Nebulization Q4H Dustin Flock, MD   3 mL at 11/19/18 1154  . lactulose (CHRONULAC) 10 GM/15ML solution 30 g  30 g Oral BID PRN Dustin Flock, MD      . magnesium oxide (MAG-OX) tablet 400 mg  400 mg Oral BID AC & HS Dustin Flock, MD   400 mg at 11/19/18 0926  . meclizine (ANTIVERT) tablet 25 mg  25 mg Oral TID PRN Dustin Flock, MD      . methylPREDNISolone sodium succinate (SOLU-MEDROL) 40 mg/mL injection 40 mg  40 mg Intravenous Q12H Bettey Costa, MD   40 mg at 11/19/18 0615  . metoprolol tartrate (LOPRESSOR) tablet 100 mg  100 mg Oral BID Dustin Flock, MD   100 mg at 11/19/18 0919  . multivitamin with minerals tablet 1 tablet  1 tablet Oral Daily Dustin Flock, MD   1 tablet at 11/19/18 0920  . nicotine (NICODERM CQ - dosed in mg/24 hr) patch 7 mg  7 mg Transdermal Daily Dustin Flock, MD   7 mg at 11/19/18 0920  . nitroGLYCERIN (NITROSTAT) SL tablet 0.4 mg  0.4 mg Sublingual Q5 min PRN Dustin Flock, MD      . oxyCODONE-acetaminophen (PERCOCET/ROXICET) 5-325 MG per tablet 2 tablet   2 tablet Oral Q6H PRN Bettey Costa, MD   2 tablet at 11/19/18 1218  . pantoprazole (PROTONIX) EC tablet 40 mg  40 mg Oral Daily Dustin Flock, MD   40 mg at 11/19/18 0919  . promethazine (PHENERGAN) injection 12.5 mg  12.5 mg Intravenous Q6H PRN Arta Silence, MD   12.5 mg at 11/19/18 1405  . rivaroxaban (XARELTO) tablet 20 mg  20 mg Oral QPM Dustin Flock, MD   20 mg at 11/18/18 1646  . sodium chloride flush (NS) 0.9 % injection 3 mL  3  mL Intravenous Q12H Dustin Flock, MD   3 mL at 11/19/18 1103  . sodium chloride flush (NS) 0.9 % injection 3 mL  3 mL Intravenous PRN Dustin Flock, MD      . tiZANidine (ZANAFLEX) tablet 4 mg  4 mg Oral QHS Dustin Flock, MD   4 mg at 11/18/18 2159  . topiramate (TOPAMAX) tablet 25 mg  25 mg Oral BID Dustin Flock, MD   25 mg at 11/18/18 2159  . Vitamin D (Ergocalciferol) (DRISDOL) capsule 50,000 Units  50,000 Units Oral Once per day on Mon Thu Patel, Shreyang, MD   50,000 Units at 11/17/18 2040    Musculoskeletal: Strength & Muscle Tone: within normal limits Gait & Station: normal Patient leans: N/A  Psychiatric Specialty Exam: Physical Exam  Vitals reviewed.   ROS: Please see hospitalist and cardiology note  Blood pressure 99/68, pulse 91, temperature 98.4 F (36.9 C), temperature source Oral, resp. rate 18, height 5\' 4"  (1.626 m), weight 119.7 kg, last menstrual period 11/08/2009, SpO2 100 %.Body mass index is 45.3 kg/m.  General Appearance: Casual  Eye Contact:  Good  Speech:  Clear and Coherent and Normal Rate  Volume:  Normal  Mood:  "I am fine"  Affect:  Congruent; appropriate  Thought Process:  Coherent, Goal Directed and Linear  Orientation:  Full (Time, Place, and Person)  Thought Content:  Logical  Suicidal Thoughts:  No  Homicidal Thoughts:  No  Memory:  Immediate;   Good Recent;   Good Remote;   Good  Judgement:  Good  Insight:  Good  Psychomotor Activity:  Normal  Concentration:  Concentration: Good and  Attention Span: Good  Recall:  Good  Fund of Knowledge:  Good  Language:  Good  Akathisia:  No  Handed:  Right  AIMS (if indicated):     Assets:  Industrial/product designer  ADL's:  Intact  Cognition:  WNL       Treatment Plan Summary  Ms. Aul is a very pleasant 52 year old married African-American female with a history of multiple medical problems including CHF who was admitted to the medicine floor with shortness of breath, cough, congestion and wheezing.  Psychiatry consult was called after the patient had a negative interaction with the hospitalist who was taking care of her.  The patient herself denies any history of any psychiatric illness including depressive symptoms or anxiety.  She denies any problems with suicidal thoughts or psychosis.  No history of a mood disorder.  The patient felt that the hospitalist actions were disrespectful and degrading causing her some emotional distress.  She however was coping fairly well when I spoke with her.  The patient's version of the events with regards to the altercation with the hospitalist was supported by the nurse taking care of her who was also in the room at the time. The nurse witnessed the hospitalist causing the patient emotional distress and supported the fact that the patient was not properly physically examined properly by the hospitalist.  She is not an imminent danger to herself or others necessitating any further psychiatric care  I cannot give her diagnosis as she is not struggling with any psychiatric illness.      Disposition: No evidence of imminent risk to self or others at present.    Chauncey Mann, MD 11/19/2018 2:06 PM

## 2018-11-21 ENCOUNTER — Telehealth: Payer: Self-pay | Admitting: *Deleted

## 2018-11-21 ENCOUNTER — Telehealth: Payer: Self-pay | Admitting: Family

## 2018-11-21 MED ORDER — POTASSIUM CHLORIDE CRYS ER 20 MEQ PO TBCR: 20.0000 meq | EXTENDED_RELEASE_TABLET | Freq: Every day | ORAL | 0 refills | Status: DC

## 2018-11-21 MED ORDER — METOLAZONE 2.5 MG PO TABS: 2.5000 mg | ORAL_TABLET | Freq: Every day | ORAL | 0 refills | Status: DC

## 2018-11-21 NOTE — Telephone Encounter (Signed)
Clarified with Gerald Stabs that the script had been cancelled and rewritten to be filled on 11-21-18.

## 2018-11-21 NOTE — Telephone Encounter (Signed)
Patient called to update on her weights. Patient says that she continues to have weight gain and was recently discharged on 20mg  torsemide BID. She says that she gained 2 pounds while in the hospital and continues to feel short of breath and tight in her abdomen.   Will add 2.5mg  metolazone daily for the next 3 days. Advised to take it 1/2 hour prior to her torsemide. Will also add 47meq potassium daily while taking metolazone.   Instructed her to call her cardiologist to see if they can see her later this week as we will be closed for the Christmas holiday. She says that she will call them. Also instructed her that should her symptoms worsen, to go back to the ED.

## 2018-11-25 ENCOUNTER — Encounter: Payer: Self-pay | Admitting: *Deleted

## 2018-11-25 DIAGNOSIS — Z952 Presence of prosthetic heart valve: Secondary | ICD-10-CM

## 2018-11-29 ENCOUNTER — Encounter: Payer: Self-pay | Admitting: *Deleted

## 2018-11-29 DIAGNOSIS — Z952 Presence of prosthetic heart valve: Secondary | ICD-10-CM

## 2018-11-29 NOTE — Progress Notes (Signed)
Cardiac Individual Treatment Plan  Patient Details  Name: Chelsea Ramsey MRN: [id number] Date of Birth: 04-06-1966 Referring Provider:     Cardiac Rehab from 08/25/2018 in [facility] Cardiac and Pulmonary Rehab  Referring Provider  [name]      Initial Encounter Date:    Cardiac Rehab from 08/25/2018 in [facility] Cardiac and Pulmonary Rehab  Date  08/25/18      Visit Diagnosis: S/P mitral valve replacement  Patient's Home Medications on Admission:  Current Outpatient Medications:  .  bisacodyl (DULCOLAX) 5 MG EC tablet, Take 2 tablets (10 mg total) by mouth daily as needed for moderate constipation., Disp: 30 tablet, Rfl: 0 .  budesonide-formoterol (SYMBICORT) 160-4.5 MCG/ACT inhaler, Inhale 2 puffs into the lungs 2 (two) times daily., Disp: , Rfl:  .  butalbital-acetaminophen-caffeine (FIORICET, ESGIC) 50-325-40 MG tablet, Take 1-2 tablets by mouth every 6 (six) hours as needed for headache., Disp: 20 tablet, Rfl: 0 .  Calcium Carbonate-Vit D-Min (GNP CALCIUM 1200) 1200-1000 MG-UNIT CHEW, Chew 1,200 mg by mouth daily with breakfast. Take in combination with vitamin D and magnesium., Disp: 30 tablet, Rfl: 5 .  Cholecalciferol (VITAMIN D3) 125 MCG (5000 UT) CAPS, Take 1 capsule (5,000 Units total) by mouth daily with breakfast. Take along with calcium and magnesium., Disp: 30 capsule, Rfl: 5 .  doxycycline (VIBRA-TABS) 100 MG tablet, Take 1 tablet (100 mg total) by mouth every 12 (twelve) hours., Disp: 8 tablet, Rfl: 0 .  ergocalciferol (VITAMIN D2) 1.25 MG (50000 UT) capsule, Take 1 capsule (50,000 Units total) by mouth 2 (two) times a week. X 6 weeks., Disp: 12 capsule, Rfl: 0 .  gabapentin (NEURONTIN) 300 MG capsule, Take 600 mg by mouth 3 (three) times daily. , Disp: , Rfl:  .  guaiFENesin (MUCINEX) 600 MG 12 hr tablet, Take 1 tablet (600 mg total) by mouth 2 (two) times daily., Disp: 20 tablet, Rfl: 0 .  ipratropium-albuterol (DUONEB) 0.5-2.5 (3) MG/3ML SOLN, Inhale 3 mLs into the lungs 2 (two)  times daily as needed (respiratory). , Disp: , Rfl:  .  lactulose (CHRONULAC) 10 GM/15ML solution, Take 45 mLs (30 g total) by mouth 2 (two) times daily as needed for mild constipation., Disp: 240 mL, Rfl: 0 .  Magnesium 500 MG CAPS, Take 1 capsule (500 mg total) by mouth 2 (two) times daily at 8 am and 10 pm., Disp: 60 capsule, Rfl: 5 .  magnesium oxide (MAG-OX) 400 MG tablet, Take 400 mg by mouth 2 (two) times daily., Disp: , Rfl:  .  meclizine (ANTIVERT) 12.5 MG tablet, Take 25 mg by mouth 3 (three) times daily as needed for dizziness., Disp: , Rfl:  .  metolazone (ZAROXOLYN) 2.5 MG tablet, Take 1 tablet (2.5 mg total) by mouth daily. Take 1/2 hour prior to torsemide, Disp: 3 tablet, Rfl: 0 .  metoprolol tartrate (LOPRESSOR) 100 MG tablet, Take 1 tablet (100 mg total) by mouth 2 (two) times daily., Disp: 60 tablet, Rfl: 0 .  nicotine (NICODERM CQ - DOSED IN MG/24 HR) 7 mg/24hr patch, Place 1 patch (7 mg total) onto the skin daily., Disp: 28 patch, Rfl: 0 .  nitroGLYCERIN (NITROSTAT) 0.4 MG SL tablet, Place 1 tablet (0.4 mg total) under the tongue every 5 (five) minutes as needed for chest pain., Disp: 30 tablet, Rfl: 12 .  omeprazole (PRILOSEC) 40 MG capsule, Take 40 mg by mouth 2 (two) times daily., Disp: , Rfl:  .  Oxycodone HCl 10 MG TABS, Take 0.5-1 tablets (5-10 mg total)  by mouth 2 (two) times daily as needed. Must last 30 days. MAX.: 2/day, Disp: 60 tablet, Rfl: 0 .  potassium chloride SA (K-DUR,KLOR-CON) 20 MEQ tablet, Take 1 tablet (20 mEq total) by mouth daily. While taking metolazone, Disp: 3 tablet, Rfl: 0 .  predniSONE (STERAPRED UNI-PAK 21 TAB) 10 MG (21) TBPK tablet, Take as directed, Disp: 21 tablet, Rfl: 0 .  promethazine (PHENERGAN) 12.5 MG tablet, Take 1 tablet (12.5 mg total) by mouth every 6 (six) hours as needed for nausea or vomiting., Disp: 30 tablet, Rfl: 0 .  rivaroxaban (XARELTO) 20 MG TABS tablet, Take 20 mg by mouth every evening. , Disp: , Rfl:  .  tiZANidine  (ZANAFLEX) 4 MG tablet, Take 4 mg by mouth at bedtime. , Disp: , Rfl:  .  topiramate (TOPAMAX) 25 MG tablet, Take 25 mg by mouth 2 (two) times daily. , Disp: , Rfl:  .  torsemide (DEMADEX) 20 MG tablet, Take 1 tablet (20 mg total) by mouth 2 (two) times daily., Disp: 180 tablet, Rfl: 0  Past Medical History: Past Medical History:  Diagnosis Date  . Allergy    seasonal  . Anxiety   . Arthritis    Right Knee  . Asthma   . CHF (congestive heart failure) ([place])   . COPD (chronic obstructive pulmonary disease) ([place])   . Coronary artery disease    Leaky heart valve  . Fibromyalgia   . GERD (gastroesophageal reflux disease)   . Hypertension   . Pneumonia   . PUD (peptic ulcer disease)   . Pulmonary HTN ([place])   . Rheumatic fever/heart disease   . Sleep apnea     Tobacco Use: Social History   Tobacco Use  Smoking Status Current Every Day Smoker  . Packs/day: 0.50  . Types: Cigarettes  Smokeless Tobacco Never Used    Labs: Recent Review Flowsheet Data    Labs for ITP Cardiac and Pulmonary Rehab Latest Ref Rng & Units 06/03/2012 12/09/2013 04/03/2014 08/21/2014 12/20/2017   Cholestrol 0 - 200 mg/dL - 91 96 - -   LDLCALC mg/dL - 50 51 - -   HDL 35 - 70 mg/dL - 31(L) 32(A) - -   Trlycerides 40 - 160 mg/dL - 50 63 - -   Hemoglobin A1c - 4.5 - - 5.2 -   PHART 7.350 - 7.450 - - - - 7.39   PCO2ART 32.0 - 48.0 mmHg - - - - 41   HCO3 20.0 - 28.0 mmol/L - - - - 24.8   ACIDBASEDEF 0.0 - 2.0 mmol/L - - - - 0.2   O2SAT % - - - - 98.4       Exercise Target Goals: Exercise Program Goal: Individual exercise prescription set using results from initial 6 min walk test and THRR while considering  patient's activity barriers and safety.   Exercise Prescription Goal: Initial exercise prescription builds to 30-45 minutes a day of aerobic activity, 2-3 days per week.  Home exercise guidelines will be given to patient during program as part of exercise prescription that the participant will  acknowledge.  Activity Barriers & Risk Stratification: Activity Barriers & Cardiac Risk Stratification - 08/25/18 1550      Activity Barriers & Cardiac Risk Stratification   Activity Barriers  Arthritis;Back Problems;Shortness of Breath;Joint Problems;Fibromyalgia    Cardiac Risk Stratification  Moderate       6 Minute Walk: 6 Minute Walk    Row Name 08/25/18 1500  6 Minute Walk   Phase  Initial     Distance  965 feet     Walk Time  6 minutes     # of Rest Breaks  0     MPH  1.83     METS  2.7     RPE  11     Perceived Dyspnea   2     VO2 Peak  9.46     Symptoms  Yes (comment)     Comments  back pain 6/10 / pulled 02 tank on 2 L     Resting HR  102 bpm     Resting BP  126/90     Resting Oxygen Saturation   98 %     Exercise Oxygen Saturation  during 6 min walk  93 %     Max Ex. HR  110 bpm     Max Ex. BP  136/84     2 Minute Post BP  116/84       Interval Oxygen   Interval Oxygen?  Yes     1 Minute Oxygen Saturation %  93 %     1 Minute Liters of Oxygen  2 L     2 Minute Oxygen Saturation %  95 %     2 Minute Liters of Oxygen  2 L     3 Minute Liters of Oxygen  2 L     4 Minute Oxygen Saturation %  95 %     4 Minute Liters of Oxygen  2 L     5 Minute Liters of Oxygen  2 L     6 Minute Oxygen Saturation %  97 %     6 Minute Liters of Oxygen  2 L     2 Minute Post Oxygen Saturation %  97 %     2 Minute Post Liters of Oxygen  2 L        Oxygen Initial Assessment:   Oxygen Re-Evaluation: Oxygen Re-Evaluation    Row Name 09/27/18 1012             Program Oxygen Prescription   Program Oxygen Prescription  None         Home Oxygen   Home Oxygen Device  Portable Concentrator;Home Concentrator       Sleep Oxygen Prescription  Continuous       Liters per minute  3       Home Exercise Oxygen Prescription  None       Home at Rest Exercise Oxygen Prescription  None uses as needed on 2L       Compliance with Home Oxygen Use  Yes          Goals/Expected Outcomes   Short Term Goals  To learn and exhibit compliance with exercise, home and travel O2 prescription;To learn and understand importance of monitoring SPO2 with pulse oximeter and demonstrate accurate use of the pulse oximeter.;To learn and understand importance of maintaining oxygen saturations>88%;To learn and demonstrate proper pursed lip breathing techniques or other breathing techniques.;To learn and demonstrate proper use of respiratory medications       Long  Term Goals  Exhibits compliance with exercise, home and travel O2 prescription;Verbalizes importance of monitoring SPO2 with pulse oximeter and return demonstration;Maintenance of O2 saturations>88%;Exhibits proper breathing techniques, such as pursed lip breathing or other method taught during program session;Compliance with respiratory medication       Comments  Chelsea Ramsey is doing well in rehab.  She is doing well with her nebulaizers and inhalers.  She monitors her sats daily with pulse oximeter.  We talked about using PLB regularly. She is compliant with her oxygen at night and only uses it during the day if she feels SOB.       Goals/Expected Outcomes  Short: Continue to monitor saturations and wean off day time use of oxygen.  Long: Continue monitor pulmonary disease.           Oxygen Discharge (Final Oxygen Re-Evaluation): Oxygen Re-Evaluation - 09/27/18 1012      Program Oxygen Prescription   Program Oxygen Prescription  None      Home Oxygen   Home Oxygen Device  Portable Concentrator;Home Concentrator    Sleep Oxygen Prescription  Continuous    Liters per minute  3    Home Exercise Oxygen Prescription  None    Home at Rest Exercise Oxygen Prescription  None   uses as needed on 2L   Compliance with Home Oxygen Use  Yes      Goals/Expected Outcomes   Short Term Goals  To learn and exhibit compliance with exercise, home and travel O2 prescription;To learn and understand importance of monitoring SPO2 with  pulse oximeter and demonstrate accurate use of the pulse oximeter.;To learn and understand importance of maintaining oxygen saturations>88%;To learn and demonstrate proper pursed lip breathing techniques or other breathing techniques.;To learn and demonstrate proper use of respiratory medications    Long  Term Goals  Exhibits compliance with exercise, home and travel O2 prescription;Verbalizes importance of monitoring SPO2 with pulse oximeter and return demonstration;Maintenance of O2 saturations>88%;Exhibits proper breathing techniques, such as pursed lip breathing or other method taught during program session;Compliance with respiratory medication    Comments  Chelsea Ramsey is doing well in rehab.  She is doing well with her nebulaizers and inhalers.  She monitors her sats daily with pulse oximeter.  We talked about using PLB regularly. She is compliant with her oxygen at night and only uses it during the day if she feels SOB.    Goals/Expected Outcomes  Short: Continue to monitor saturations and wean off day time use of oxygen.  Long: Continue monitor pulmonary disease.        Initial Exercise Prescription: Initial Exercise Prescription - 08/25/18 1500      Date of Initial Exercise RX and Referring Provider   Date  08/25/18    Referring Provider  [name]      Oxygen   Oxygen  Continuous    Liters  2      Treadmill   MPH  1.8    Grade  1    Minutes  15    METs  2.63      NuStep   Level  2    SPM  80    Minutes  15    METs  2.6      Biostep-RELP   Level  2    SPM  50    Minutes  15    METs  2      Prescription Details   Frequency (times per week)  2    Duration  Progress to 30 minutes of continuous aerobic without signs/symptoms of physical distress      Intensity   THRR 40-80% of Max Heartrate  128-155    Ratings of Perceived Exertion  11-13    Perceived Dyspnea  0-4      Resistance Training   Training Prescription  Yes  Weight  3 lb    Reps  10-15       Perform  Capillary Blood Glucose checks as needed.  Exercise Prescription Changes: Exercise Prescription Changes    Row Name 08/25/18 1500 09/12/18 1600 09/27/18 1000 10/11/18 1500       Response to Exercise   Blood Pressure (Admit)  126/90  132/82  122/62  126/70    Blood Pressure (Exercise)  136/84  138/60  146/74  148/80    Blood Pressure (Exit)  116/84  122/78  122/62  -    Heart Rate (Admit)  102 bpm  95 bpm  71 bpm  86 bpm    Heart Rate (Exercise)  110 bpm  112 bpm  121 bpm  119 bpm    Heart Rate (Exit)  102 bpm  92 bpm  103 bpm  90 bpm    Oxygen Saturation (Admit)  96 %  -  -  -    Oxygen Saturation (Exercise)  93 %  95 %  -  -    Oxygen Saturation (Exit)  97 %  -  -  -    Rating of Perceived Exertion (Exercise)  _0 Perceived Dyspnea (Exercise)  2  -  -  -    Symptoms  back pain  none  none  CP, dizziness    Comments  gout L toe  first full day of exercise  -  -    Duration  Progress to 30 minutes of  aerobic without signs/symptoms of physical distress  Progress to 30 minutes of  aerobic without signs/symptoms of physical distress  Continue with 30 min of aerobic exercise without signs/symptoms of physical distress.  Continue with 30 min of aerobic exercise without signs/symptoms of physical distress.    Intensity  -  THRR New  THRR unchanged  THRR unchanged      Progression   Progression  -  Continue to progress workloads to maintain intensity without signs/symptoms of physical distress.  Continue to progress workloads to maintain intensity without signs/symptoms of physical distress.  Continue to progress workloads to maintain intensity without signs/symptoms of physical distress.    Average METs  -  2.45  2.66  -      Resistance Training   Training Prescription  -  Yes  Yes  Yes    Weight  -  3 lb  3 lbs  3 lbs    Reps  -  10-15  10-15  10-15      Interval Training   Interval Training  -  No  No  No      Treadmill   MPH  -  -  2.3  2.3    Grade  -  -  1  1     Minutes  -  -  15  12    METs  -  -  3.08  -      NuStep   Level  -  2  2  -    Minutes  -  15  15  -    METs  -  2.9  2.9  -      Biostep-RELP   Level  -  2  8  -    Minutes  -  15  15  -    METs  -  2  2  -      Home Exercise Plan  Plans to continue exercise at  -  -  Home (comment) walk at park  Home (comment) walk at park    Frequency  -  -  Add 3 additional days to program exercise sessions.  Add 3 additional days to program exercise sessions.    Initial Home Exercises Provided  -  -  09/27/18  09/27/18       Exercise Comments: Exercise Comments    Row Name 09/08/18 0910           Exercise Comments  First full day of exercise!  Patient was oriented to gym and equipment including functions, settings, policies, and procedures.  Patient's individual exercise prescription and treatment plan were reviewed.  All starting workloads were established based on the results of the 6 minute walk test done at initial orientation visit.  The plan for exercise progression was also introduced and progression will be customized based on patient's performance and goals.          Exercise Goals and Review: Exercise Goals    Row Name 08/25/18 1500             Exercise Goals   Increase Physical Activity  Yes       Intervention  Provide advice, education, support and counseling about physical activity/exercise needs.;Develop an individualized exercise prescription for aerobic and resistive training based on initial evaluation findings, risk stratification, comorbidities and participant's personal goals.       Expected Outcomes  Short Term: Attend rehab on a regular basis to increase amount of physical activity.;Long Term: Add in home exercise to make exercise part of routine and to increase amount of physical activity.;Long Term: Exercising regularly at least 3-5 days a week.       Increase Strength and Stamina  Yes       Intervention  Provide advice, education, support and counseling  about physical activity/exercise needs.;Develop an individualized exercise prescription for aerobic and resistive training based on initial evaluation findings, risk stratification, comorbidities and participant's personal goals.       Expected Outcomes  Short Term: Increase workloads from initial exercise prescription for resistance, speed, and METs.;Short Term: Perform resistance training exercises routinely during rehab and add in resistance training at home;Long Term: Improve cardiorespiratory fitness, muscular endurance and strength as measured by increased METs and functional capacity (6MWT)       Able to understand and use rate of perceived exertion (RPE) scale  Yes       Intervention  Provide education and explanation on how to use RPE scale       Expected Outcomes  Short Term: Able to use RPE daily in rehab to express subjective intensity level;Long Term:  Able to use RPE to guide intensity level when exercising independently       Able to understand and use Dyspnea scale  Yes       Intervention  Provide education and explanation on how to use Dyspnea scale       Expected Outcomes  Short Term: Able to use Dyspnea scale daily in rehab to express subjective sense of shortness of breath during exertion;Long Term: Able to use Dyspnea scale to guide intensity level when exercising independently       Knowledge and understanding of Target Heart Rate Range (THRR)  Yes       Intervention  Provide education and explanation of THRR including how the numbers were predicted and where they are located for reference       Expected Outcomes  Short Term: Able to state/look up THRR;Short Term: Able to use daily as guideline for intensity in rehab;Long Term: Able to use THRR to govern intensity when exercising independently       Able to check pulse independently  Yes       Intervention  Provide education and demonstration on how to check pulse in carotid and radial arteries.;Review the importance of being able  to check your own pulse for safety during independent exercise       Expected Outcomes  Short Term: Able to explain why pulse checking is important during independent exercise;Long Term: Able to check pulse independently and accurately       Understanding of Exercise Prescription  Yes       Intervention  Provide education, explanation, and written materials on patient's individual exercise prescription       Expected Outcomes  Short Term: Able to explain program exercise prescription;Long Term: Able to explain home exercise prescription to exercise independently          Exercise Goals Re-Evaluation : Exercise Goals Re-Evaluation    Row Name 09/08/18 0911 09/22/18 1017 09/27/18 1001 10/11/18 1549 10/24/18 1610     Exercise Goal Re-Evaluation   Exercise Goals Review  Increase Physical Activity;Increase Strength and Stamina;Able to understand and use rate of perceived exertion (RPE) scale;Knowledge and understanding of Target Heart Rate Range (THRR);Understanding of Exercise Prescription  -  Increase Physical Activity;Increase Strength and Stamina;Understanding of Exercise Prescription  Increase Physical Activity;Increase Strength and Stamina;Understanding of Exercise Prescription  -   Comments  Reviewed RPE scale, THR and program prescription with pt today.  Pt voiced understanding and was given a copy of goals to take home.   -  Chelsea Ramsey is off to a good start in rehab.  She has already started to notice that her strength and stamina are starting to come back. She is already walking daily in the morning.  She continues to have some back pain, but knows strengthening the muscles will help. She also has some sciattica pain in back and legs.  Reviewed home exercise with pt today.  Pt plans to walk at park for exercise.  Reviewed THR, pulse, RPE, sign and symptoms, and when to call 911 or MD.  Also discussed weather considerations and indoor options.  Pt voiced understanding.  Chelsea Ramsey has been doing well in rehab.  She has only attended once since last review.  Today, she had a spell on the treadmill with dizziness and chest pain.  She was sent to the ED for further work up.  Troponins were negative.   Once she is able to return we will continue to work on her progress.   out since last review   Expected Outcomes  Short: Use RPE daily to regulate intensity. Long: Follow program prescription in THR.  -  Short: Continue to walk daily.  Long: Continue to attend regularly and exercise more.   Short: Cleared to return to rehab.  Long: Continue to improve strength and stamina.   -   Row Name 11/25/18 [id number]             Exercise Goal Re-Evaluation   Comments  out since last review          Discharge Exercise Prescription (Final Exercise Prescription Changes): Exercise Prescription Changes - 10/11/18 1500      Response to Exercise   Blood Pressure (Admit)  126/70    Blood Pressure (Exercise)  148/80    Heart Rate (Admit)  86 bpm    Heart Rate (Exercise)  119 bpm    Heart Rate (Exit)  90 bpm    Rating of Perceived Exertion (Exercise)  13    Symptoms  CP, dizziness    Duration  Continue with 30 min of aerobic exercise without signs/symptoms of physical distress.    Intensity  THRR unchanged      Progression   Progression  Continue to progress workloads to maintain intensity without signs/symptoms of physical distress.      Resistance Training   Training Prescription  Yes    Weight  3 lbs    Reps  10-15      Interval Training   Interval Training  No      Treadmill   MPH  2.3    Grade  1    Minutes  12      Home Exercise Plan   Plans to continue exercise at  Home (comment)   walk at park   Frequency  Add 3 additional days to program exercise sessions.    Initial Home Exercises Provided  09/27/18       Nutrition:  Target Goals: Understanding of nutrition guidelines, daily intake of sodium <1563m, cholesterol <2035m calories 30% from fat and 7% or less from saturated fats, daily to have 5  or more servings of fruits and vegetables.  Biometrics: Pre Biometrics - 08/25/18 1459      Pre Biometrics   Height  5' 6.5" (1.689 m)    Weight  258 lb 9.6 oz (117.3 kg)    Waist Circumference  47 inches    Hip Circumference  54 inches    Waist to Hip Ratio  0.87 %    BMI (Calculated)  41.12    Single Leg Stand  30 seconds        Nutrition Therapy Plan and Nutrition Goals: Nutrition Therapy & Goals - 09/22/18 1133      Nutrition Therapy   Diet  TLC    Protein (specify units)  8oz    Fiber  20 grams    Whole Grain Foods  3 servings   does not currently choose whole grains   Saturated Fats  14 max. grams    Fruits and Vegetables  5 servings/day   8 Ideal; does not regularly eat fruits and vegetables   Sodium  1500 grams      Personal Nutrition Goals   Nutrition Goal  Work to decrease soda and candy consumption. The best solution would be not to buy these items in the first place to reduce temptation, but you can also start by setting goals such as drinking one less glass of soda and more more glass of water per day, or choosing applesauce/pudding rather than a candy bar as a snack    Personal Goal #2  Increase fruit and vegetable intake by at least one additional serving per week. Long term goal would be to eat multiple servings of fruits and vegetables daily    Personal Goal #3  Rather than 'nibbling' on foods throughout the day, work to create a structured eating plan that includes meals/snacks with at least two food groups. This will help improve fullness and satiety when you eat and decrease cravings    Comments  Pt reports wt regain s/p recent surgery which she suspects is due to increase in snacking, which has taken the place of smoking (trying to quit). She is choosing candy and chips which has lead to her 'craving' these  items more now as well. She has also been snacking/ "nibbling" on foods throughout the day rather than having sit-down meals. She has however been trying to  reduce sodium intake and no longer adds salt to meals. She does not to out to eat often. For beverage she does drink water but also has been drinking juice regularly and finishes a 3L of soda every two days      Intervention Plan   Intervention  Prescribe, educate and counsel regarding individualized specific dietary modifications aiming towards targeted core components such as weight, hypertension, lipid management, diabetes, heart failure and other comorbidities.    Expected Outcomes  Short Term Goal: Understand basic principles of dietary content, such as calories, fat, sodium, cholesterol and nutrients.;Short Term Goal: A plan has been developed with personal nutrition goals set during dietitian appointment.;Long Term Goal: Adherence to prescribed nutrition plan.       Nutrition Assessments: Nutrition Assessments - 08/25/18 1549      MEDFICTS Scores   Pre Score  125       Nutrition Goals Re-Evaluation: Nutrition Goals Re-Evaluation    [place] Name 09/22/18 1147             Goals   Nutrition Goal  Work to decrease soda and candy consumption. The best solution would be not to buy these items in the first place to reduce temptation, but you can also start by setting goals such as drinking one less glass of soda and one more glass of water per day, or choosing applesauce/ pudding rather than a candy bar as a snack       Comment  Currently she drinks 3L of Coke-a-Cola in two days, drinks juices regularly and chooses candy bars and chips as snacks regularly       Expected Outcome  She will slowly decrease SSB and non-nutritious snack intake until they are either eliminated from her diet or consumed only in moderation. She will replace SSB with water and candy/chips with more nutrient-dense snack options         Personal Goal #2 Re-Evaluation   Personal Goal #2  Increase fruit and vegetable intake by at least one additional serving per week. Long term goal would be to eat multiple servings of  fruits and vegetables daily         Personal Goal #3 Re-Evaluation   Personal Goal #3  Rather than 'nibbling' on foods througout the day, work to create a structured eating plan that includes meals/snacks with at least two food groups. This will help improve fullness and satiety when you eat and decrease cravings          Nutrition Goals Discharge (Final Nutrition Goals Re-Evaluation): Nutrition Goals Re-Evaluation - 09/22/18 1147      Goals   Nutrition Goal  Work to decrease soda and candy consumption. The best solution would be not to buy these items in the first place to reduce temptation, but you can also start by setting goals such as drinking one less glass of soda and one more glass of water per day, or choosing applesauce/ pudding rather than a candy bar as a snack    Comment  Currently she drinks 3L of Coke-a-Cola in two days, drinks juices regularly and chooses candy bars and chips as snacks regularly    Expected Outcome  She will slowly decrease SSB and non-nutritious snack intake until they are either eliminated from her diet or consumed only in moderation. She will replace SSB with water  and candy/chips with more nutrient-dense snack options      Personal Goal #2 Re-Evaluation   Personal Goal #2  Increase fruit and vegetable intake by at least one additional serving per week. Long term goal would be to eat multiple servings of fruits and vegetables daily      Personal Goal #3 Re-Evaluation   Personal Goal #3  Rather than 'nibbling' on foods througout the day, work to create a structured eating plan that includes meals/snacks with at least two food groups. This will help improve fullness and satiety when you eat and decrease cravings       Psychosocial: Target Goals: Acknowledge presence or absence of significant depression and/or stress, maximize coping skills, provide positive support system. Participant is able to verbalize types and ability to use techniques and skills needed  for reducing stress and depression.   Initial Review & Psychosocial Screening: Initial Psych Review & Screening - 08/25/18 1543      Initial Review   Current issues with  Current Sleep Concerns;Current Stress Concerns   has been sleeping on the couch since surgery (3 months ago) because of sternal discomfort. Wants to start sleeping in bed.    Source of Stress Concerns  Transportation;Unable to perform yard/household activities;Financial    Comments  Does not drive. Relies on family members to drive. Wants to get back to hobbies/doing house work/ building up stamina.       Family Dynamics   Good Support System?  Yes   mother and aunt     Screening Interventions   Interventions  Encouraged to exercise;Program counselor consult;To provide support and resources with identified psychosocial needs;Provide feedback about the scores to participant    Expected Outcomes  Short Term goal: Utilizing psychosocial counselor, staff and physician to assist with identification of specific Stressors or current issues interfering with healing process. Setting desired goal for each stressor or current issue identified.;Long Term Goal: Stressors or current issues are controlled or eliminated.;Short Term goal: Identification and review with participant of any Quality of Life or Depression concerns found by scoring the questionnaire.;Long Term goal: The participant improves quality of Life and PHQ9 Scores as seen by post scores and/or verbalization of changes       Quality of Life Scores:  Quality of Life - 08/25/18 1545      Quality of Life   Select  Quality of Life      Quality of Life Scores   Health/Function Pre  16.4 %    Socioeconomic Pre  22.29 %    Psych/Spiritual Pre  18 %    Family Pre  27 %    GLOBAL Pre  19.27 %      Scores of 19 and below usually indicate a poorer quality of life in these areas.  A difference of  2-3 points is a clinically meaningful difference.  A difference of 2-3 points  in the total score of the Quality of Life Index has been associated with significant improvement in overall quality of life, self-image, physical symptoms, and general health in studies assessing change in quality of life.  PHQ-9: Recent Review Flowsheet Data    Depression screen [facility] 2/9 11/14/2018 08/25/2018 07/26/2018   Decreased Interest 0 1 0   Down, Depressed, Hopeless 0 0 0   PHQ - 2 Score 0 1 0   Altered sleeping - 2 -   Tired, decreased energy - 2 -   Change in appetite - 1 -   Feeling bad or  failure about yourself  - 0 -   Trouble concentrating - 2 -   Moving slowly or fidgety/restless - 0 -   Suicidal thoughts - 0 -   PHQ-9 Score - 8 -   Difficult doing work/chores - Not difficult at all -     Interpretation of Total Score  Total Score Depression Severity:  1-4 = Minimal depression, 5-9 = Mild depression, 10-14 = Moderate depression, 15-19 = Moderately severe depression, 20-27 = Severe depression   Psychosocial Evaluation and Intervention: Psychosocial Evaluation - 09/22/18 1039      Psychosocial Evaluation & Interventions   Interventions  Stress management education;Relaxation education;Encouraged to exercise with the program and follow exercise prescription;Therapist referral    Comments  Counselor met with Chelsea Ramsey today [facility]) for initial psychosocial evaluation.  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  Chelsea Ramsey has multiple issues with Fibromyalgia; COPD; CHF: Pulmonary Lung Disease; Osteoarthritis; and OSA in addition to her Cardiac condition.  She has a strong support system with a spouse of 2 years; parents who live locally and active involvement in her local church.  Britt with interrupted sleep and had a sleep study before her surgery and is awaiting a CPAP as a result.  Her appetite is up and down currently.  Chelsea Ramsey reports a long history of depression and anxiety with some panic attacks.  She reports her Dr. just prescribed new meds  for this but they are not working and she "feels worse" with more "irritability."  Counselor encouraged her to let her Dr. know this asap.  She has a trauma history that has never been addressed fully with the loss of an infant at 65 days old (her only child).  Chelsea Ramsey has multiple stressors with her health as well as unresolved grief and trauma.  counselor provided contact info for a therapist locally, and Chelsea Ramsey agreed to pursue this option.  She has goals to lose weight; decrease stress; improve her sleep quality; and increase her stamina and strength while in this program.      Expected Outcomes  Short:  Chelsea Ramsey will contact a therapist locally to support her journey of grief and unresolved trauma.  She also will exercise for her health and her mental health - to cope with stress better.  Long:  Chelsea Ramsey will make positive self-care choices with diet and exercise to improve her health and mental health.    Continue Psychosocial Services   Follow up required by staff       Psychosocial Re-Evaluation:   Psychosocial Discharge (Final Psychosocial Re-Evaluation):   Vocational Rehabilitation: Provide vocational rehab assistance to qualifying candidates.   Vocational Rehab Evaluation & Intervention: Vocational Rehab - 08/25/18 1542      Initial Vocational Rehab Evaluation & Intervention   Assessment shows need for Vocational Rehabilitation  Yes    Vocational Rehab Packet given to patient  --   paperwork will be given first day of class      Education: Education Goals: Education classes will be provided on a variety of topics geared toward better understanding of heart health and risk factor modification. Participant will state understanding/return demonstration of topics presented as noted by education test scores.  Learning Barriers/Preferences: Learning Barriers/Preferences - 08/25/18 1541      Learning Barriers/Preferences   Learning Barriers  None    Learning Preferences  Individual Instruction        Education Topics:  AED/CPR: - Group verbal and  written instruction with the use of models to demonstrate the basic use of the AED with the basic ABC's of resuscitation.   General Nutrition Guidelines/Fats and Fiber: -Group instruction provided by verbal, written material, models and posters to present the general guidelines for heart healthy nutrition. Gives an explanation and review of dietary fats and fiber.   Controlling Sodium/Reading Food Labels: -Group verbal and written material supporting the discussion of sodium use in heart healthy nutrition. Review and explanation with models, verbal and written materials for utilization of the food label.   Exercise Physiology & General Exercise Guidelines: - Group verbal and written instruction with models to review the exercise physiology of the cardiovascular system and associated critical values. Provides general exercise guidelines with specific guidelines to those with heart or lung disease.    Aerobic Exercise & Resistance Training: - Gives group verbal and written instruction on the various components of exercise. Focuses on aerobic and resistive training programs and the benefits of this training and how to safely progress through these programs..   Flexibility, Balance, Mind/Body Relaxation: Provides group verbal/written instruction on the benefits of flexibility and balance training, including mind/body exercise modes such as yoga, pilates and tai chi.  Demonstration and skill practice provided.   Stress and Anxiety: - Provides group verbal and written instruction about the health risks of elevated stress and causes of high stress.  Discuss the correlation between heart/lung disease and anxiety and treatment options. Review healthy ways to manage with stress and anxiety.   Cardiac Rehab from 10/11/2018 in [facility] Cardiac and Pulmonary Rehab  Date  09/13/18  Educator  [facility]  Instruction Review Code  1- Verbalizes Understanding       Depression: - Provides group verbal and written instruction on the correlation between heart/lung disease and depressed mood, treatment options, and the stigmas associated with seeking treatment.   Anatomy & Physiology of the Heart: - Group verbal and written instruction and models provide basic cardiac anatomy and physiology, with the coronary electrical and arterial systems. Review of Valvular disease and Heart Failure   Cardiac Procedures: - Group verbal and written instruction to review commonly prescribed medications for heart disease. Reviews the medication, class of the drug, and side effects. Includes the steps to properly store meds and maintain the prescription regimen. (beta blockers and nitrates)   Cardiac Rehab from 10/11/2018 in [facility] Cardiac and Pulmonary Rehab  Date  09/27/18  Educator  [place]  Instruction Review Code  1- Verbalizes Understanding      Cardiac Medications I: - Group verbal and written instruction to review commonly prescribed medications for heart disease. Reviews the medication, class of the drug, and side effects. Includes the steps to properly store meds and maintain the prescription regimen.   Cardiac Medications II: -Group verbal and written instruction to review commonly prescribed medications for heart disease. Reviews the medication, class of the drug, and side effects. (all other drug classes)   Cardiac Rehab from 10/11/2018 in [facility] Cardiac and Pulmonary Rehab  Date  09/08/18  Educator  [place]  Instruction Review Code  1- Verbalizes Understanding       Go Sex-Intimacy & Heart Disease, Get SMART - Goal Setting: - Group verbal and written instruction through game format to discuss heart disease and the return to sexual intimacy. Provides group verbal and written material to discuss and apply goal setting through the application of the S.M.A.R.T. Method.   Cardiac Rehab from 10/11/2018 in [facility] Cardiac and Pulmonary Rehab  Date  09/27/18  Educator  [place]  Instruction Review Code  1- Verbalizes Understanding      Other Matters of the Heart: - Provides group verbal, written materials and models to describe Stable Angina and Peripheral Artery. Includes description of the disease process and treatment options available to the cardiac patient.   Exercise & Equipment Safety: - Individual verbal instruction and demonstration of equipment use and safety with use of the equipment.   Cardiac Rehab from 10/11/2018 in [facility] Cardiac and Pulmonary Rehab  Date  08/25/18  Educator  [facility]  Instruction Review Code  1- Verbalizes Understanding      Infection Prevention: - Provides verbal and written material to individual with discussion of infection control including proper hand washing and proper equipment cleaning during exercise session.   Cardiac Rehab from 10/11/2018 in [facility] Cardiac and Pulmonary Rehab  Date  08/25/18  Educator  [facility]  Instruction Review Code  1- Verbalizes Understanding      Falls Prevention: - Provides verbal and written material to individual with discussion of falls prevention and safety.   Cardiac Rehab from 10/11/2018 in [facility] Cardiac and Pulmonary Rehab  Date  08/25/18  Educator  [facility]  Instruction Review Code  1- Verbalizes Understanding      Diabetes: - Individual verbal and written instruction to review signs/symptoms of diabetes, desired ranges of glucose level fasting, after meals and with exercise. Acknowledge that pre and post exercise glucose checks will be done for 3 sessions at entry of program.   Know Your Numbers and Risk Factors: -Group verbal and written instruction about important numbers in your health.  Discussion of what are risk factors and how they play a role in the disease process.  Review of Cholesterol, Blood Pressure, Diabetes, and BMI and the role they play in your overall health.   Cardiac Rehab from 10/11/2018 in [facility] Cardiac and Pulmonary Rehab  Date  09/08/18  Educator  [place]   Instruction Review Code  1- Verbalizes Understanding      Sleep Hygiene: -Provides group verbal and written instruction about how sleep can affect your health.  Define sleep hygiene, discuss sleep cycles and impact of sleep habits. Review good sleep hygiene tips.    Cardiac Rehab from 10/11/2018 in [facility] Cardiac and Pulmonary Rehab  Date  10/11/18  Educator  [facility]  Instruction Review Code  1- Verbalizes Understanding      Other: -Provides group and verbal instruction on various topics (see comments)   Knowledge Questionnaire Score: Knowledge Questionnaire Score - 08/25/18 1541      Knowledge Questionnaire Score   Pre Score  19/26   correct answers reviewed with [facility], focus on exercise, nutrition, and MI      Core Components/Risk Factors/Patient Goals at Admission: Personal Goals and Risk Factors at Admission - 08/25/18 1539      Core Components/Risk Factors/Patient Goals on Admission    Weight Management  Yes;Obesity;Weight Loss    Intervention  Weight Management: Develop a combined nutrition and exercise program designed to reach desired caloric intake, while maintaining appropriate intake of nutrient and fiber, sodium and fats, and appropriate energy expenditure required for the weight goal.;Weight Management: Provide education and appropriate resources to help participant work on and attain dietary goals.;Weight Management/Obesity: Establish reasonable short term and long term weight goals.;Obesity: Provide education and appropriate resources to help participant work on and attain dietary goals.    Admit Weight  258 lb 9.6 oz (117.3 kg)    Goal Weight: Short Term  253 lb (114.8  kg)    Goal Weight: Long Term  200 lb (90.7 kg)    Expected Outcomes  Short Term: Continue to assess and modify interventions until short term weight is achieved;Long Term: Adherence to nutrition and physical activity/exercise program aimed toward attainment of established weight goal;Weight Loss:  Understanding of general recommendations for a balanced deficit meal plan, which promotes 1-2 lb weight loss per week and includes a negative energy balance of [id number] kcal/d;Understanding recommendations for meals to include 15-35% energy as protein, 25-35% energy from fat, 35-60% energy from carbohydrates, less than '200mg'$  of dietary cholesterol, 20-35 gm of total fiber daily;Understanding of distribution of calorie intake throughout the day with the consumption of 4-5 meals/snacks    Tobacco Cessation  Yes    Intervention  Assist the participant in steps to quit. Provide individualized education and counseling about committing to Tobacco Cessation, relapse prevention, and pharmacological support that can be provided by physician.;Advice worker, assist with locating and accessing local/national Quit Smoking programs, and support quit date choice.    Expected Outcomes  Short Term: Will demonstrate readiness to quit, by selecting a quit date.;Short Term: Will quit all tobacco product use, adhering to prevention of relapse plan.;Long Term: Complete abstinence from all tobacco products for at least 12 months from quit date.    Heart Failure  Yes    Intervention  Provide a combined exercise and nutrition program that is supplemented with education, support and counseling about heart failure. Directed toward relieving symptoms such as shortness of breath, decreased exercise tolerance, and extremity edema.    Expected Outcomes  Improve functional capacity of life;Short term: Attendance in program 2-3 days a week with increased exercise capacity. Reported lower sodium intake. Reported increased fruit and vegetable intake. Reports medication compliance.;Short term: Daily weights obtained and reported for increase. Utilizing diuretic protocols set by physician.;Long term: Adoption of self-care skills and reduction of barriers for early signs and symptoms recognition and intervention leading to  self-care maintenance.    Hypertension  Yes    Intervention  Provide education on lifestyle modifcations including regular physical activity/exercise, weight management, moderate sodium restriction and increased consumption of fresh fruit, vegetables, and low fat dairy, alcohol moderation, and smoking cessation.;Monitor prescription use compliance.    Expected Outcomes  Short Term: Continued assessment and intervention until BP is < 140/13m HG in hypertensive participants. < 130/837mHG in hypertensive participants with diabetes, heart failure or chronic kidney disease.;Long Term: Maintenance of blood pressure at goal levels.       Core Components/Risk Factors/Patient Goals Review:  Goals and Risk Factor Review    Row Name 09/27/18 1003             Core Components/Risk Factors/Patient Goals Review   Personal Goals Review  Heart Failure;Weight Management/Obesity;Hypertension;Improve shortness of breath with ADL's       Review  SaJolaine Clicks off to a good start in rehab. She has already started to lose some weight and is down to 156lbs.  She has cut back on soda and candy bars.  She weighs daily and checks blood pressure and saturations daily as well!!  She has been having some shortness of breath and is using her oxygen at night and occasionally during the day. She still has some incisional pain but knows that she is getting better.  Still within her recovery window.  She is doing well with her medicaitons.        Expected Outcomes  Short: Continue to weigh daily and work weight  loss.  Long: Continue to manage heart failure.           Core Components/Risk Factors/Patient Goals at Discharge (Final Review):  Goals and Risk Factor Review - 09/27/18 1003      Core Components/Risk Factors/Patient Goals Review   Personal Goals Review  Heart Failure;Weight Management/Obesity;Hypertension;Improve shortness of breath with ADL's    Review  Chelsea Ramsey is off to a good start in rehab. She has already started to  lose some weight and is down to 156lbs.  She has cut back on soda and candy bars.  She weighs daily and checks blood pressure and saturations daily as well!!  She has been having some shortness of breath and is using her oxygen at night and occasionally during the day. She still has some incisional pain but knows that she is getting better.  Still within her recovery window.  She is doing well with her medicaitons.     Expected Outcomes  Short: Continue to weigh daily and work weight loss.  Long: Continue to manage heart failure.        ITP Comments: ITP Comments    Row Name 08/25/18 1531 09/07/18 0607 10/05/18 0555 10/11/18 1304 10/24/18 1609   ITP Comments  Med Review completed. Initial ITP created. Diagnosis can be found in [facility] 7/22  30 day review completed. ITP sent to [name], Medical Director of Cardiac Rehab. Continue with ITP unless changes are made by physician  Starts sessions on 10/10  30 day review. Continue with ITP unless direccted changes per Medical Director Chart Review.  Pt experienced dizziness while walking on the treadmill. Assisted patient to seating. Patient then reported nausea and chest pain. She was taken to the Emergency Department for further evaluation. BP 148/80, O2 100%, HR 90.  Called to check on pt.  She was sent to [facility] ED after office visit with high PHQ and she left without being seen.  She has not returned to rehab.  Left message.    [place] Name 11/02/18 0559 11/07/18 1600 11/15/18 1605 11/25/18 0941 11/29/18 0625   ITP Comments  30 day review. Continue with ITP unless direccted changes per Medical Director Chart Review. Still not returned does have clearance to return  Called to check on pt.  She had another round of atypical chest pain, during which she was admitted.  They did not find any cause other than anxiety.  She was discharged and will schedule a follow up with cardiology.  She was encouraged to continue with rehab.  She hopes to return next week.    Called to check on pt.  She was seen by pain clinic and referred for bariatric surgery.   Left message.   Waiting for clearance to return to rehab.  Has a follow up appt with heart failure clinic on 12/02/18.    30 Day Review. Continue with ITP unless directed changes per Medical Director review Remains out for medical concerns      Comments:

## 2018-12-01 ENCOUNTER — Emergency Department: Payer: Medicaid Other

## 2018-12-01 ENCOUNTER — Other Ambulatory Visit: Payer: Self-pay

## 2018-12-01 ENCOUNTER — Encounter: Payer: Self-pay | Admitting: *Deleted

## 2018-12-01 ENCOUNTER — Inpatient Hospital Stay
Admission: EM | Admit: 2018-12-01 | Discharge: 2018-12-06 | DRG: 193 | Disposition: A | Discharge status: Home / Self Care | Payer: Medicaid Other | Attending: Internal Medicine | Admitting: Internal Medicine

## 2018-12-01 DIAGNOSIS — J9621 Acute and chronic respiratory failure with hypoxia: Secondary | ICD-10-CM | POA: Diagnosis present

## 2018-12-01 DIAGNOSIS — Z8249 Family history of ischemic heart disease and other diseases of the circulatory system: Secondary | ICD-10-CM

## 2018-12-01 DIAGNOSIS — I5033 Acute on chronic diastolic (congestive) heart failure: Secondary | ICD-10-CM | POA: Diagnosis present

## 2018-12-01 DIAGNOSIS — G629 Polyneuropathy, unspecified: Secondary | ICD-10-CM | POA: Diagnosis present

## 2018-12-01 DIAGNOSIS — G894 Chronic pain syndrome: Secondary | ICD-10-CM | POA: Diagnosis present

## 2018-12-01 DIAGNOSIS — I081 Rheumatic disorders of both mitral and tricuspid valves: Secondary | ICD-10-CM | POA: Diagnosis present

## 2018-12-01 DIAGNOSIS — Z885 Allergy status to narcotic agent status: Secondary | ICD-10-CM

## 2018-12-01 DIAGNOSIS — Z9119 Patient's noncompliance with other medical treatment and regimen: Secondary | ICD-10-CM

## 2018-12-01 DIAGNOSIS — Z9981 Dependence on supplemental oxygen: Secondary | ICD-10-CM

## 2018-12-01 DIAGNOSIS — R140 Abdominal distension (gaseous): Secondary | ICD-10-CM | POA: Diagnosis present

## 2018-12-01 DIAGNOSIS — Z888 Allergy status to other drugs, medicaments and biological substances status: Secondary | ICD-10-CM

## 2018-12-01 DIAGNOSIS — J189 Pneumonia, unspecified organism: Principal | ICD-10-CM | POA: Diagnosis present

## 2018-12-01 DIAGNOSIS — Z886 Allergy status to analgesic agent status: Secondary | ICD-10-CM

## 2018-12-01 DIAGNOSIS — E876 Hypokalemia: Secondary | ICD-10-CM | POA: Diagnosis present

## 2018-12-01 DIAGNOSIS — Z7901 Long term (current) use of anticoagulants: Secondary | ICD-10-CM

## 2018-12-01 DIAGNOSIS — G4733 Obstructive sleep apnea (adult) (pediatric): Secondary | ICD-10-CM | POA: Diagnosis present

## 2018-12-01 DIAGNOSIS — Z952 Presence of prosthetic heart valve: Secondary | ICD-10-CM

## 2018-12-01 DIAGNOSIS — Z825 Family history of asthma and other chronic lower respiratory diseases: Secondary | ICD-10-CM

## 2018-12-01 DIAGNOSIS — I509 Heart failure, unspecified: Secondary | ICD-10-CM

## 2018-12-01 DIAGNOSIS — I5031 Acute diastolic (congestive) heart failure: Secondary | ICD-10-CM

## 2018-12-01 DIAGNOSIS — J441 Chronic obstructive pulmonary disease with (acute) exacerbation: Secondary | ICD-10-CM | POA: Diagnosis present

## 2018-12-01 DIAGNOSIS — Z79899 Other long term (current) drug therapy: Secondary | ICD-10-CM

## 2018-12-01 DIAGNOSIS — I110 Hypertensive heart disease with heart failure: Secondary | ICD-10-CM | POA: Diagnosis present

## 2018-12-01 DIAGNOSIS — F17210 Nicotine dependence, cigarettes, uncomplicated: Secondary | ICD-10-CM | POA: Diagnosis present

## 2018-12-01 DIAGNOSIS — J440 Chronic obstructive pulmonary disease with acute lower respiratory infection: Secondary | ICD-10-CM | POA: Diagnosis present

## 2018-12-01 DIAGNOSIS — Z7951 Long term (current) use of inhaled steroids: Secondary | ICD-10-CM

## 2018-12-01 DIAGNOSIS — R1013 Epigastric pain: Secondary | ICD-10-CM | POA: Diagnosis present

## 2018-12-01 DIAGNOSIS — Z96651 Presence of right artificial knee joint: Secondary | ICD-10-CM | POA: Diagnosis present

## 2018-12-01 DIAGNOSIS — I480 Paroxysmal atrial fibrillation: Secondary | ICD-10-CM | POA: Diagnosis present

## 2018-12-01 DIAGNOSIS — E8770 Fluid overload, unspecified: Secondary | ICD-10-CM

## 2018-12-01 DIAGNOSIS — Z6841 Body Mass Index (BMI) 40.0 and over, adult: Secondary | ICD-10-CM

## 2018-12-01 DIAGNOSIS — Z765 Malingerer [conscious simulation]: Secondary | ICD-10-CM

## 2018-12-01 LAB — TROPONIN I: Troponin I: <0.03 ng/mL (ref <0.03)

## 2018-12-01 LAB — CBC
HCT: 36.4 % (ref 36.0–46.0)
Hemoglobin: 11.1 g/dL — ABNORMAL LOW (ref 12.0–15.0)
MCH: 25.3 pg — AB (ref 26.0–34.0)
MCHC: 30.5 g/dL (ref 30.0–36.0)
MCV: 83.1 fL (ref 80.0–100.0)
Platelets: 231 10*3/uL (ref 150–400)
RBC: 4.38 MIL/uL (ref 3.87–5.11)
RDW: 16.2 % — ABNORMAL HIGH (ref 11.5–15.5)
WBC: 14.2 10*3/uL — ABNORMAL HIGH (ref 4.0–10.5)
nRBC: 0 % (ref 0.0–0.2)

## 2018-12-01 LAB — BASIC METABOLIC PANEL
Anion gap: 7 (ref 5–15)
BUN: 19 mg/dL (ref 6–20)
CO2: 31 mmol/L (ref 22–32)
Calcium: 8.9 mg/dL (ref 8.9–10.3)
Chloride: 102 mmol/L (ref 98–111)
Creatinine, Ser: 0.67 mg/dL (ref 0.44–1.00)
GFR calc Af Amer: >60 mL/min (ref >60)
GLUCOSE: 98 mg/dL (ref 70–99)
Potassium: 3.4 mmol/L — ABNORMAL LOW (ref 3.5–5.1)
Sodium: 140 mmol/L (ref 135–145)

## 2018-12-01 LAB — HEPATIC FUNCTION PANEL
ALT: 20 U/L (ref 0–44)
AST: 18 U/L (ref 15–41)
Albumin: 3.7 g/dL (ref 3.5–5.0)
Alkaline Phosphatase: 116 U/L (ref 38–126)
Bilirubin, Direct: <0.1 mg/dL (ref 0.0–0.2)
Total Bilirubin: 0.1 mg/dL — ABNORMAL LOW (ref 0.3–1.2)
Total Protein: 6.9 g/dL (ref 6.5–8.1)

## 2018-12-01 LAB — BRAIN NATRIURETIC PEPTIDE: B Natriuretic Peptide: 259 pg/mL — ABNORMAL HIGH (ref 0.0–100.0)

## 2018-12-01 LAB — LIPASE, BLOOD: Lipase: 37 U/L (ref 11–51)

## 2018-12-01 MED ORDER — FUROSEMIDE 10 MG/ML IJ SOLN
80.0000 mg | Freq: Once | INTRAMUSCULAR | Status: AC
  Administered 2018-12-01: 80 mg via INTRAVENOUS
  Filled 2018-12-01: qty 8

## 2018-12-01 MED ORDER — IOPAMIDOL (ISOVUE-300) INJECTION 61%
100.0000 mL | Freq: Once | INTRAVENOUS | Status: AC | PRN
  Administered 2018-12-01: 100 mL via INTRAVENOUS

## 2018-12-01 MED ORDER — POTASSIUM CHLORIDE 20 MEQ PO PACK
40.0000 meq | PACK | Freq: Once | ORAL | Status: AC
  Administered 2018-12-02: 40 meq via ORAL
  Filled 2018-12-01: qty 2

## 2018-12-01 MED ORDER — MORPHINE SULFATE (PF) 4 MG/ML IV SOLN
4.0000 mg | Freq: Once | INTRAVENOUS | Status: AC
  Administered 2018-12-01: 4 mg via INTRAVENOUS
  Filled 2018-12-01: qty 1

## 2018-12-01 MED ORDER — IPRATROPIUM-ALBUTEROL 0.5-2.5 (3) MG/3ML IN SOLN
3.0000 mL | Freq: Once | RESPIRATORY_TRACT | Status: AC
  Administered 2018-12-01: 3 mL via RESPIRATORY_TRACT
  Filled 2018-12-01: qty 3

## 2018-12-01 MED ORDER — PROMETHAZINE HCL 25 MG/ML IJ SOLN
25.0000 mg | Freq: Once | INTRAMUSCULAR | Status: AC
  Administered 2018-12-01: 25 mg via INTRAVENOUS
  Filled 2018-12-01: qty 1

## 2018-12-01 NOTE — ED Provider Notes (Signed)
[facility] Emergency Department Provider Note ____________________________________________   First MD Initiated Contact with Patient 12/01/18 1821     (approximate)  I have reviewed the triage vital signs and the nursing notes.   HISTORY  Chief Complaint Dizziness    HPI Chelsea Ramsey is a 53 y.o. female with PMH as noted below including CHF and COPD who presents with a primary complaint of epigastric abdominal pain and distention, gradual onset and worsening course.  The patient associates this with swelling related to her CHF and has had it in the past.  The patient states that after her recent admission she was put on torsemide, having previously been on Lasix, and states that the symptoms have been worsening since then.  She reports some mild increased shortness of breath.  She states that the abdominal pain is worse when she walks and is associated with nausea but no vomiting.  She denies fever.  She denies any significant peripheral edema.   Past Medical History:  Diagnosis Date  . Allergy    seasonal  . Anxiety   . Arthritis    Right Knee  . Asthma   . CHF (congestive heart failure) ([place])   . COPD (chronic obstructive pulmonary disease) ([place])   . Coronary artery disease    Leaky heart valve  . Fibromyalgia   . GERD (gastroesophageal reflux disease)   . Hypertension   . Pneumonia   . PUD (peptic ulcer disease)   . Pulmonary HTN ([place])   . Rheumatic fever/heart disease   . Sleep apnea     Patient Active Problem List   Diagnosis Date Noted  . Class 3 severe obesity with body mass index (BMI) of 45.0 to 49.9 in adult ([place]) 11/14/2018  . Chronic anticoagulation (Xarelto) 11/14/2018  . Vitamin D deficiency 11/14/2018  . Chronic knee pain after total replacement (Right) 11/14/2018  . Chest pain 10/17/2018  . Chronic low back pain (Primary Area of Pain) (Bilateral) w/ sciatica (Left) 10/10/2018  . Chronic lower extremity pain (Secondary  Area of Pain) (Left) 10/10/2018  . Sternal pain (Tertiary Area of Pain) 10/10/2018  . Fibromyalgia (Fourth Area of Pain) 10/10/2018  . Chronic knee pain (Fifth Area of Pain) (Right) 10/10/2018  . Chronic sacroiliac joint pain 10/10/2018  . Chronic pain syndrome 10/10/2018  . Long term current use of opiate analgesic 10/10/2018  . Pharmacologic therapy 10/10/2018  . Disorder of skeletal system 10/10/2018  . Problems influencing health status 10/10/2018  . Chronic diastolic heart failure ([place]) 07/26/2018  . HTN (hypertension) 07/26/2018  . Acute CHF (congestive heart failure) ([place]) 07/17/2018  . Melena   . Nausea and vomiting   . Acute esophagogastric ulcer   . Intractable vomiting with nausea   . GIB (gastrointestinal bleeding) 05/22/2018  . A-fib ([place]) 04/22/2018  . COPD (chronic obstructive pulmonary disease) ([place]) 12/21/2017  . COPD (chronic obstructive pulmonary disease) with acute bronchitis ([place]) 12/20/2017  . Primary localized osteoarthritis of right knee 09/04/2016    Past Surgical History:  Procedure Laterality Date  . ADENOIDECTOMY    . ESOPHAGOGASTRODUODENOSCOPY (EGD) WITH PROPOFOL N/A 05/25/2018   Procedure: ESOPHAGOGASTRODUODENOSCOPY (EGD) WITH PROPOFOL;  Surgeon: Lucilla Lame, MD;  Location: [facility] ENDOSCOPY;  Service: Endoscopy;  Laterality: N/A;  . MITRAL VALVE REPLACEMENT    . MULTIPLE TOOTH EXTRACTIONS    . TONSILLECTOMY    . TOTAL KNEE ARTHROPLASTY Right 09/04/2016   Procedure: TOTAL KNEE ARTHROPLASTY; with lateral release;  Surgeon: Earlie Server, MD;  Location:  [place] OR;  Service: Orthopedics;  Laterality: Right;    Prior to Admission medications   Medication Sig Start Date End Date Taking? Authorizing Provider  bisacodyl (DULCOLAX) 5 MG EC tablet Take 2 tablets (10 mg total) by mouth daily as needed for moderate constipation. 07/21/18  Yes Epifanio Lesches, MD  budesonide-formoterol (SYMBICORT) 160-4.5 MCG/ACT inhaler Inhale 2 puffs into the lungs 2 (two)  times daily. 02/26/16  Yes [provider]  butalbital-acetaminophen-caffeine (FIORICET, ESGIC) 50-325-40 MG tablet Take 1-2 tablets by mouth every 6 (six) hours as needed for headache. 10/11/18 10/11/19 Yes Veronese, [place], MD  Calcium Carbonate-Vit D-Min [place] CALCIUM 1200) 1200-1000 MG-UNIT CHEW Chew 1,200 mg by mouth daily with breakfast. Take in combination with vitamin D and magnesium. 11/14/18 05/13/19 Yes Milinda Pointer, MD  Cholecalciferol (VITAMIN D3) 125 MCG (5000 UT) CAPS Take 1 capsule (5,000 Units total) by mouth daily with breakfast. Take along with calcium and magnesium. 11/14/18 05/13/19 Yes Milinda Pointer, MD  ergocalciferol (VITAMIN D2) 1.25 MG (50000 UT) capsule Take 1 capsule (50,000 Units total) by mouth 2 (two) times a week. X 6 weeks. 11/14/18 12/26/18 Yes Milinda Pointer, MD  gabapentin (NEURONTIN) 300 MG capsule Take 600 mg by mouth 3 (three) times daily.  07/22/15  Yes [provider]  ipratropium-albuterol (DUONEB) 0.5-2.5 (3) MG/3ML SOLN Inhale 3 mLs into the lungs 3 (three) times daily as needed (respiratory).  02/26/16 12/20/18 Yes [provider]  lactulose (CHRONULAC) 10 GM/15ML solution Take 45 mLs (30 g total) by mouth 2 (two) times daily as needed for mild constipation. 07/21/18  Yes Epifanio Lesches, MD  Magnesium 500 MG CAPS Take 1 capsule (500 mg total) by mouth 2 (two) times daily at 8 am and 10 pm. 11/14/18 05/13/19 Yes Milinda Pointer, MD  magnesium oxide (MAG-OX) 400 MG tablet Take 400 mg by mouth 2 (two) times daily. 07/11/18 07/11/19 Yes [provider]  meclizine (ANTIVERT) 12.5 MG tablet Take 25 mg by mouth 3 (three) times daily as needed for dizziness.   Yes [provider]  metoprolol tartrate (LOPRESSOR) 100 MG tablet Take 1 tablet (100 mg total) by mouth 2 (two) times daily. 11/05/18  Yes Mody, Ulice Bold, MD  nicotine (NICODERM CQ - DOSED IN MG/24 HR) 7 mg/24hr patch Place 1 patch (7 mg total) onto the  skin daily. 11/06/18  Yes Mody, Ulice Bold, MD  nitroGLYCERIN (NITROSTAT) 0.4 MG SL tablet Place 1 tablet (0.4 mg total) under the tongue every 5 (five) minutes as needed for chest pain. 11/05/18  Yes Mody, Ulice Bold, MD  omeprazole (PRILOSEC) 40 MG capsule Take 40 mg by mouth 2 (two) times daily.   Yes [provider]  Oxycodone HCl 10 MG TABS Take 0.5-1 tablets (5-10 mg total) by mouth 2 (two) times daily as needed. Must last 30 days. MAX.: 2/day Patient taking differently: Take 5-10 mg by mouth 3 (three) times daily as needed. Must last 30 days. MAX.: 2/day 11/21/18 12/21/18 Yes Milinda Pointer, MD  promethazine (PHENERGAN) 12.5 MG tablet Take 1 tablet (12.5 mg total) by mouth every 6 (six) hours as needed for nausea or vomiting. 11/06/18  Yes Mody, Ulice Bold, MD  rivaroxaban (XARELTO) 20 MG TABS tablet Take 20 mg by mouth every evening.  07/11/18 07/11/19 Yes [provider]  tiZANidine (ZANAFLEX) 4 MG tablet Take 4 mg by mouth at bedtime.  08/10/18  Yes [provider]  topiramate (TOPAMAX) 25 MG tablet Take 25 mg by mouth 2 (two) times daily.  08/10/18  Yes [provider]  torsemide (DEMADEX) 20 MG tablet Take 1 tablet (20 mg total) by mouth 2 (two) times daily. 11/19/18  Yes Salary, Avel Peace, MD  doxycycline (VIBRA-TABS) 100 MG tablet Take 1 tablet (100 mg total) by mouth every 12 (twelve) hours. Patient not taking: Reported on 12/01/2018 11/19/18   Salary, Avel Peace, MD  guaiFENesin (MUCINEX) 600 MG 12 hr tablet Take 1 tablet (600 mg total) by mouth 2 (two) times daily. Patient not taking: Reported on 12/01/2018 11/19/18   Salary, Holly Bodily D, MD  metolazone (ZAROXOLYN) 2.5 MG tablet Take 1 tablet (2.5 mg total) by mouth daily. Take 1/2 hour prior to torsemide Patient not taking: Reported on 12/01/2018 11/21/18 02/19/19  Darylene Price A, FNP  potassium chloride SA (K-DUR,KLOR-CON) 20 MEQ tablet Take 1 tablet (20 mEq total) by mouth daily. While taking metolazone Patient not  taking: Reported on 12/01/2018 11/21/18   Alisa Graff, FNP  predniSONE (STERAPRED UNI-PAK 21 TAB) 10 MG (21) TBPK tablet Take as directed Patient not taking: Reported on 12/01/2018 11/19/18   Salary, Avel Peace, MD    Allergies Amiodarone; Aspirin; Flexeril [cyclobenzaprine]; Trazamine [trazodone & diet manage prod]; Codeine; and Tramadol  Family History  Problem Relation Age of Onset  . COPD Mother   . Cancer Mother        Bone  . Asthma Mother   . Congestive Heart Failure Father     Social History Social History   Tobacco Use  . Smoking status: Current Every Day Smoker    Packs/day: 0.50    Types: Cigarettes  . Smokeless tobacco: Never Used  Substance Use Topics  . Alcohol use: Not Currently    Alcohol/week: 3.0 standard drinks    Types: 3 Cans of beer per week    Comment: 16 oz per week  . Drug use: Not Currently    Comment: Previous use of cocaine and marijuana last use 07/31/16     Review of Systems  Constitutional: No fever. Eyes: No redness. ENT: No sore throat. Cardiovascular: Denies chest pain. Respiratory: Positive for shortness of breath. Gastrointestinal: Positive for nausea. Genitourinary: Negative for dysuria. Musculoskeletal: Negative for back pain. Skin: Negative for rash. Neurological: Negative for headache.   ____________________________________________   PHYSICAL EXAM:  VITAL SIGNS: ED Triage Vitals  Enc Vitals Group     BP 12/01/18 1558 (!) 134/100     Pulse Rate 12/01/18 1558 100     Resp 12/01/18 1558 20     Temp 12/01/18 1558 98.4 F (36.9 C)     Temp Source 12/01/18 1558 Oral     SpO2 12/01/18 1558 100 %     Weight 12/01/18 1559 270 lb (122.5 kg)     Height 12/01/18 1559 5\' 4"  (1.626 m)     Head Circumference --      Peak Flow --      Pain Score 12/01/18 1559 9     Pain Loc --      Pain Edu? --      Excl. in [place]? --     Constitutional: Alert and oriented.  Uncomfortable appearing but in no acute distress. Eyes: Conjunctivae  are normal.  No scleral icterus. Head: Atraumatic. Nose: No congestion/rhinnorhea. Mouth/Throat: Mucous membranes are moist.   Neck: Normal range of motion.  Cardiovascular: Normal rate, regular rhythm. Grossly normal heart sounds.  Good peripheral circulation. Respiratory: Normal respiratory effort.  No retractions. Lungs CTAB. Gastrointestinal: Soft with some epigastric tenderness.  No peritoneal signs.  No distention.  Genitourinary: No flank tenderness. Musculoskeletal: Trace bilateral lower extremity edema.  Extremities warm and well perfused.  Neurologic:  Normal speech and language. No gross focal neurologic deficits are appreciated.  Skin:  Skin is warm and dry. No rash noted. Psychiatric: Mood and affect are normal. Speech and behavior are normal.  ____________________________________________   LABS (all labs ordered are listed, but only abnormal results are displayed)  Labs Reviewed  BASIC METABOLIC PANEL - Abnormal; Notable for the following components:      Result Value   Potassium 3.4 (*)    All other components within normal limits  CBC - Abnormal; Notable for the following components:   WBC 14.2 (*)    Hemoglobin 11.1 (*)    MCH 25.3 (*)    RDW 16.2 (*)    All other components within normal limits  HEPATIC FUNCTION PANEL - Abnormal; Notable for the following components:   Total Bilirubin 0.1 (*)    All other components within normal limits  BRAIN NATRIURETIC PEPTIDE - Abnormal; Notable for the following components:   B Natriuretic Peptide 259.0 (*)    All other components within normal limits  TROPONIN I  LIPASE, BLOOD   ____________________________________________  EKG  ED ECG REPORT I, Arta Silence, the attending physician, personally viewed and interpreted this ECG.  Date: 12/01/2018 EKG Time: 1551 Rate: 103 Rhythm: Sinus tachycardia QRS Axis: normal Intervals: normal ST/T Wave abnormalities: Nonspecific T wave abnormalities  laterally Narrative Interpretation: no evidence of acute ischemia; new nonspecific lateral T wave inversions when compared to EKG from 11/20/2018  ____________________________________________  RADIOLOGY  CXR: No focal infiltrate.  Vascular congestion with no acute edema. CT abdomen: No acute intra-abdominal abnormalities ____________________________________________   PROCEDURES  Procedure(s) performed: No  Procedures  Critical Care performed: No ____________________________________________   INITIAL IMPRESSION / ASSESSMENT AND PLAN / ED COURSE  Pertinent labs & imaging results that were available during my care of the patient were reviewed by me and considered in my medical decision making (see chart for details).  53 year old female with history of CHF and COPD presents primarily with upper abdominal discomfort and swelling associated with nausea and some shortness of breath.  I reviewed the past medical records in Epic; the patient was most recently admitted earlier this month with acute respiratory failure due to CHF, requiring BiPAP.  Per cardiology notes during the admission, the patient was to be switched from Lasix to torsemide. From the prior notes, it seems that the patient CHF has manifested with weight gain and with abdominal distention similar to her current symptoms.  The patient does endorse weight gain since she left the hospital.  She states that she has not really been urinating frequently on the torsemide.  On exam the patient is slightly uncomfortable appearing but in no acute distress.  Her abdomen is soft with mild distention and there is some epigastric tenderness.  She otherwise does not have significant peripheral edema.  Her lungs demonstrate decreased breath sounds bilaterally.  The remainder of the exam is as described above.  Her EKG shows new nonspecific inferior lateral T wave inversions.  Overall I suspect that the patient's symptoms are primarily  related to her CHF with inadequate diuresis, given her weight gain and the recurrent abdominal distention.  However given her upper abdominal pain, nausea, and the tenderness on exam, the differential also includes pancreatitis, other hepatobiliary etiology, or gastritis.  I have a low suspicion for ACS.  Nisha labs obtained from triage are significant for elevated  WBC count.  Troponin is negative.  I will add on BNP, LFTs, and lipase and treat symptomatically for pain and nausea as well as giving a dose of IV Lasix.  If the additional work-up is reassuring and the patient's symptoms improve, she may possibly be discharged home.  ----------------------------------------- 11:17 PM on 12/01/2018 -----------------------------------------  The additional lab work-up was unremarkable.  The patient continued to have relatively significant upper abdominal pain and was tender.  Given her elevated WBC count and the otherwise unexplained cause of the pain I ordered a CT abdomen.  The CT was negative.  On reassessment, the patient stated that the pain had improved but she still felt short of breath and weak.  She is very concerned that p.o. diuretics at home have not helped and that she continues to gain weight.  She also states that Darylene Price from the CHF clinic is out of town this week and so she cannot even follow-up as an outpatient if she gets worse.  Based on the patient's weight gain and persistent symptoms I will admit her.  I signed her out to the hospitalist Dr. Jerelyn Charles.  ____________________________________________   FINAL CLINICAL IMPRESSION(S) / ED DIAGNOSES  Final diagnoses:  Acute on chronic congestive heart failure, unspecified heart failure type (HCC)  Hypervolemia, unspecified hypervolemia type      NEW MEDICATIONS STARTED DURING THIS VISIT:  New Prescriptions   No medications on file     Note:  This document was prepared using Dragon voice recognition software and may  include unintentional dictation errors.    Arta Silence, MD 12/01/18 2321

## 2018-12-01 NOTE — ED Triage Notes (Signed)
Pt to triage via wheelchair.  Pt reports weight gain again.  Pt had cabg 5 months ago at [facility]. Pt on 2 liters oxygen.   Pt reports swelling in abdomen/chest area.  Pt alert.

## 2018-12-02 ENCOUNTER — Ambulatory Visit: Payer: Medicaid Other | Admitting: Family

## 2018-12-02 ENCOUNTER — Other Ambulatory Visit: Payer: Self-pay

## 2018-12-02 LAB — ECHOCARDIOGRAM COMPLETE
Height: 64 in
Weight: 3705.6 oz

## 2018-12-02 LAB — CBC
HCT: 34.7 % — ABNORMAL LOW (ref 36.0–46.0)
HEMOGLOBIN: 10.4 g/dL — AB (ref 12.0–15.0)
MCH: 25.1 pg — ABNORMAL LOW (ref 26.0–34.0)
MCHC: 30 g/dL (ref 30.0–36.0)
MCV: 83.8 fL (ref 80.0–100.0)
Platelets: 209 10*3/uL (ref 150–400)
RBC: 4.14 MIL/uL (ref 3.87–5.11)
RDW: 16.2 % — ABNORMAL HIGH (ref 11.5–15.5)
WBC: 11.2 10*3/uL — ABNORMAL HIGH (ref 4.0–10.5)
nRBC: 0 % (ref 0.0–0.2)

## 2018-12-02 LAB — BASIC METABOLIC PANEL
Anion gap: 9 (ref 5–15)
BUN: 15 mg/dL (ref 6–20)
CHLORIDE: 102 mmol/L (ref 98–111)
CO2: 28 mmol/L (ref 22–32)
Calcium: 8.5 mg/dL — ABNORMAL LOW (ref 8.9–10.3)
Creatinine, Ser: 0.59 mg/dL (ref 0.44–1.00)
GFR calc Af Amer: >60 mL/min (ref >60)
GFR calc non Af Amer: >60 mL/min (ref >60)
Glucose, Bld: 110 mg/dL — ABNORMAL HIGH (ref 70–99)
Potassium: 3.5 mmol/L (ref 3.5–5.1)
Sodium: 139 mmol/L (ref 135–145)

## 2018-12-02 LAB — STREP PNEUMONIAE URINARY ANTIGEN: Strep Pneumo Urinary Antigen: NEGATIVE

## 2018-12-02 LAB — TROPONIN I
Troponin I: <0.03 ng/mL (ref <0.03)
Troponin I: <0.03 ng/mL (ref <0.03)

## 2018-12-02 MED ORDER — PROMETHAZINE HCL 25 MG PO TABS
12.5000 mg | ORAL_TABLET | Freq: Four times a day (QID) | ORAL | Status: DC | PRN
  Administered 2018-12-02: 12.5 mg via ORAL
  Filled 2018-12-02 (×2): qty 1

## 2018-12-02 MED ORDER — TIZANIDINE HCL 4 MG PO TABS
4.0000 mg | ORAL_TABLET | Freq: Every day | ORAL | Status: DC
  Administered 2018-12-02 – 2018-12-05 (×5): 4 mg via ORAL
  Filled 2018-12-02 (×6): qty 1

## 2018-12-02 MED ORDER — RIVAROXABAN 20 MG PO TABS
20.0000 mg | ORAL_TABLET | Freq: Every evening | ORAL | Status: DC
  Administered 2018-12-02 – 2018-12-05 (×4): 20 mg via ORAL
  Filled 2018-12-02 (×4): qty 1

## 2018-12-02 MED ORDER — PANTOPRAZOLE SODIUM 40 MG PO TBEC
40.0000 mg | DELAYED_RELEASE_TABLET | Freq: Every day | ORAL | Status: DC
  Administered 2018-12-02 – 2018-12-06 (×5): 40 mg via ORAL
  Filled 2018-12-02 (×5): qty 1

## 2018-12-02 MED ORDER — ONDANSETRON HCL 4 MG/2ML IJ SOLN: 4.0000 mg | Freq: Four times a day (QID) | INTRAMUSCULAR | Status: DC | PRN

## 2018-12-02 MED ORDER — AZITHROMYCIN 250 MG PO TABS
500.0000 mg | ORAL_TABLET | ORAL | Status: DC
  Administered 2018-12-02 – 2018-12-06 (×5): 500 mg via ORAL
  Filled 2018-12-02: qty 1
  Filled 2018-12-02 (×2): qty 2
  Filled 2018-12-02: qty 1
  Filled 2018-12-02 (×2): qty 2

## 2018-12-02 MED ORDER — NICOTINE 7 MG/24HR TD PT24
7.0000 mg | MEDICATED_PATCH | Freq: Every day | TRANSDERMAL | Status: DC
  Filled 2018-12-02: qty 1

## 2018-12-02 MED ORDER — MOMETASONE FURO-FORMOTEROL FUM 200-5 MCG/ACT IN AERO
2.0000 | INHALATION_SPRAY | Freq: Two times a day (BID) | RESPIRATORY_TRACT | Status: DC
  Administered 2018-12-02 – 2018-12-03 (×3): 2 via RESPIRATORY_TRACT
  Filled 2018-12-02 (×2): qty 8.8

## 2018-12-02 MED ORDER — TOPIRAMATE 25 MG PO TABS
25.0000 mg | ORAL_TABLET | Freq: Two times a day (BID) | ORAL | Status: DC
  Administered 2018-12-02 – 2018-12-06 (×10): 25 mg via ORAL
  Filled 2018-12-02 (×11): qty 1

## 2018-12-02 MED ORDER — BISACODYL 5 MG PO TBEC
10.0000 mg | DELAYED_RELEASE_TABLET | Freq: Every day | ORAL | Status: DC | PRN
  Administered 2018-12-03: 10 mg via ORAL
  Filled 2018-12-02 (×2): qty 2

## 2018-12-02 MED ORDER — GUAIFENESIN ER 600 MG PO TB12
600.0000 mg | ORAL_TABLET | Freq: Two times a day (BID) | ORAL | Status: DC
  Administered 2018-12-02 – 2018-12-06 (×10): 600 mg via ORAL
  Filled 2018-12-02 (×10): qty 1

## 2018-12-02 MED ORDER — LACTULOSE 10 GM/15ML PO SOLN
30.0000 g | Freq: Two times a day (BID) | ORAL | Status: DC | PRN
  Administered 2018-12-06: 30 g via ORAL
  Filled 2018-12-02: qty 60

## 2018-12-02 MED ORDER — GABAPENTIN 600 MG PO TABS
300.0000 mg | ORAL_TABLET | Freq: Two times a day (BID) | ORAL | Status: DC
  Administered 2018-12-02 – 2018-12-06 (×10): 300 mg via ORAL
  Filled 2018-12-02 (×10): qty 1

## 2018-12-02 MED ORDER — METOPROLOL TARTRATE 50 MG PO TABS
100.0000 mg | ORAL_TABLET | Freq: Two times a day (BID) | ORAL | Status: DC
  Administered 2018-12-02 – 2018-12-06 (×10): 100 mg via ORAL
  Filled 2018-12-02 (×10): qty 2

## 2018-12-02 MED ORDER — FUROSEMIDE 10 MG/ML IJ SOLN
40.0000 mg | Freq: Two times a day (BID) | INTRAMUSCULAR | Status: DC
  Administered 2018-12-02 – 2018-12-03 (×4): 40 mg via INTRAVENOUS
  Filled 2018-12-02 (×4): qty 4

## 2018-12-02 MED ORDER — OXYCODONE HCL 5 MG PO TABS
5.0000 mg | ORAL_TABLET | Freq: Two times a day (BID) | ORAL | Status: DC | PRN
  Administered 2018-12-02: 10 mg via ORAL
  Filled 2018-12-02: qty 2

## 2018-12-02 MED ORDER — SODIUM CHLORIDE 0.9 % IV SOLN
1.0000 g | INTRAVENOUS | Status: DC
  Administered 2018-12-02 – 2018-12-06 (×5): 1 g via INTRAVENOUS
  Filled 2018-12-02 (×3): qty 10
  Filled 2018-12-02 (×3): qty 1
  Filled 2018-12-02: qty 10

## 2018-12-02 MED ORDER — PROMETHAZINE HCL 25 MG/ML IJ SOLN
12.5000 mg | Freq: Four times a day (QID) | INTRAMUSCULAR | Status: DC | PRN
  Administered 2018-12-02 – 2018-12-06 (×15): 12.5 mg via INTRAVENOUS
  Filled 2018-12-02 (×17): qty 1

## 2018-12-02 MED ORDER — MAGNESIUM OXIDE 400 (241.3 MG) MG PO TABS
400.0000 mg | ORAL_TABLET | Freq: Two times a day (BID) | ORAL | Status: DC
  Administered 2018-12-02 – 2018-12-06 (×10): 400 mg via ORAL
  Filled 2018-12-02 (×10): qty 1

## 2018-12-02 MED ORDER — OXYCODONE HCL 5 MG PO TABS
5.0000 mg | ORAL_TABLET | ORAL | Status: DC | PRN
  Administered 2018-12-02 – 2018-12-06 (×21): 10 mg via ORAL
  Filled 2018-12-02 (×21): qty 2

## 2018-12-02 MED ORDER — NITROGLYCERIN 0.4 MG SL SUBL: 0.4000 mg | SUBLINGUAL_TABLET | SUBLINGUAL | Status: DC | PRN

## 2018-12-02 MED ORDER — SODIUM CHLORIDE 0.9% FLUSH
3.0000 mL | Freq: Two times a day (BID) | INTRAVENOUS | Status: DC
  Administered 2018-12-02 – 2018-12-06 (×8): 3 mL via INTRAVENOUS

## 2018-12-02 MED ORDER — SODIUM CHLORIDE 0.9% FLUSH: 3.0000 mL | INTRAVENOUS | Status: DC | PRN

## 2018-12-02 MED ORDER — SODIUM CHLORIDE 0.9 % IV SOLN
250.0000 mL | INTRAVENOUS | Status: DC | PRN
  Administered 2018-12-02: 250 mL via INTRAVENOUS

## 2018-12-02 MED ORDER — MECLIZINE HCL 25 MG PO TABS
25.0000 mg | ORAL_TABLET | Freq: Three times a day (TID) | ORAL | Status: DC | PRN
  Administered 2018-12-02 – 2018-12-06 (×6): 25 mg via ORAL
  Filled 2018-12-02 (×7): qty 1

## 2018-12-02 MED ORDER — ACETAMINOPHEN 325 MG PO TABS
650.0000 mg | ORAL_TABLET | ORAL | Status: DC | PRN
  Administered 2018-12-02: 650 mg via ORAL
  Filled 2018-12-02: qty 2

## 2018-12-02 MED ORDER — POTASSIUM CHLORIDE CRYS ER 20 MEQ PO TBCR
20.0000 meq | EXTENDED_RELEASE_TABLET | Freq: Every day | ORAL | Status: DC
  Administered 2018-12-02 – 2018-12-06 (×5): 20 meq via ORAL
  Filled 2018-12-02 (×2): qty 2
  Filled 2018-12-02 (×3): qty 1

## 2018-12-02 MED ORDER — NICOTINE 14 MG/24HR TD PT24
14.0000 mg | MEDICATED_PATCH | Freq: Every day | TRANSDERMAL | Status: DC
  Administered 2018-12-02 – 2018-12-06 (×6): 14 mg via TRANSDERMAL
  Filled 2018-12-02 (×5): qty 1

## 2018-12-02 MED ORDER — IPRATROPIUM-ALBUTEROL 0.5-2.5 (3) MG/3ML IN SOLN
3.0000 mL | Freq: Three times a day (TID) | RESPIRATORY_TRACT | Status: DC | PRN
  Administered 2018-12-02 – 2018-12-03 (×2): 3 mL via RESPIRATORY_TRACT
  Filled 2018-12-02 (×3): qty 3

## 2018-12-02 NOTE — Progress Notes (Signed)
Family Meeting Note  Advance Directive:yes  Today a meeting took place with the Patient.  Patient is able to participate   The following clinical team members were present during this meeting:MD  The following were discussed:Patient's diagnosis: Pneumonia, CHF, extreme morbid obesity, Patient's progosis: Unable to determine and Goals for treatment: Full Code  Additional follow-up to be provided: prn  Time spent during discussion: 20 minutes  Gorden Harms, MD

## 2018-12-02 NOTE — Progress Notes (Signed)
[place] [facility] [place] NAME: Chelsea Ramsey    MR#:  [id number]  DATE OF BIRTH:  [birth year]  SUBJECTIVE:  No new complaint this morning.  No fevers.  No abdominal pains this morning.  Shortness of breath improving.   REVIEW OF SYSTEMS:  Review of Systems  Constitutional: Negative for chills and fever.  HENT: Negative for hearing loss and tinnitus.   Eyes: Negative for blurred vision.  Respiratory: Positive for cough and shortness of breath.   Cardiovascular: Negative for chest pain and palpitations.  Gastrointestinal: Negative for heartburn, nausea and vomiting.  Genitourinary: Negative for urgency.  Musculoskeletal: Negative for myalgias and neck pain.  Skin: Negative for itching.  Neurological: Negative for dizziness.  Psychiatric/Behavioral: Negative for depression and substance abuse.   [place] DRUG ALLERGIES:   Allergies  Allergen Reactions  . Amiodarone Nausea And Vomiting  . Aspirin Swelling  . Flexeril [Cyclobenzaprine] Swelling  . Trazamine [Trazodone & Diet Manage Prod] Nausea And Vomiting  . Codeine Rash  . Tramadol Rash   VITALS:  Blood pressure 123/79, pulse 90, temperature 98.5 F (36.9 C), temperature source Oral, resp. rate 19, height 5\' 4"  (1.626 m), weight 122.2 kg, last menstrual period 11/08/2009, SpO2 100 %. PHYSICAL EXAMINATION:    Physical Exam  Constitutional: She is oriented to person, place, and time. She appears well-developed and well-nourished.  HENT:  Head: Normocephalic and atraumatic.  Eyes: Conjunctivae are normal.  Neck: Neck supple.  Respiratory: Effort normal.  rhonchi  GI: Soft. Bowel sounds are normal. There is no abdominal tenderness. There is no rebound.  Musculoskeletal: Normal range of motion.        General: No edema.  Neurological: She is alert and oriented to person, place, and time.  Skin: Skin is warm and dry.   LABORATORY PANEL:  Female CBC Recent Labs  Lab 12/02/18 0433   WBC 11.2*  HGB 10.4*  HCT 34.7*  PLT 209   ------------------------------------------------------------------------------------------------------------------ Chemistries  Recent Labs  Lab 12/01/18 1835 12/02/18 0433  NA  --  139  K  --  3.5  CL  --  102  CO2  --  28  GLUCOSE  --  110*  BUN  --  15  CREATININE  --  0.59  CALCIUM  --  8.5*  AST 18  --   ALT 20  --   ALKPHOS 116  --   BILITOT 0.1*  --    RADIOLOGY:  Ct Abdomen Pelvis W Contrast  Result Date: 12/01/2018 CLINICAL DATA:  Weight gain swelling in the abdomen and chest EXAM: CT ABDOMEN AND PELVIS WITH CONTRAST TECHNIQUE: Multidetector CT imaging of the abdomen and pelvis was performed using the standard protocol following bolus administration of intravenous contrast. CONTRAST:  132mL ISOVUE-300 IOPAMIDOL (ISOVUE-300) INJECTION 61% COMPARISON:  None. FINDINGS: Lower chest: Ground-glass density in the right middle lobe. No pleural effusion. Mild cardiomegaly. Valve prosthesis. Hepatobiliary: No focal liver abnormality is seen. No gallstones, gallbladder wall thickening, or biliary dilatation. Pancreas: Unremarkable. No pancreatic ductal dilatation or surrounding inflammatory changes. Spleen: Normal in size without focal abnormality. Adrenals/Urinary Tract: Adrenal glands are unremarkable. Kidneys are normal, without renal calculi, focal lesion, or hydronephrosis. Bladder is unremarkable. Stomach/Bowel: Stomach is within normal limits. Appendix appears normal. No evidence of bowel wall thickening, distention, or inflammatory changes. Sigmoid colon diverticular disease without acute inflammatory process. Vascular/Lymphatic: Mild aortic atherosclerosis. No aneurysm. No significantly enlarged lymph nodes. Reproductive: Uterus and bilateral adnexa are unremarkable. Other:  Negative for free air or free fluid. Musculoskeletal: No acute or significant osseous findings. IMPRESSION: 1. Ground-glass density in the right middle lobe, may  reflect edema or pneumonia 2. No CT evidence for acute intra-abdominal or pelvic abnormality 3. Sigmoid colon diverticular disease without acute inflammatory change Electronically Signed   By: Donavan Foil M.D.   On: 12/01/2018 21:08   ASSESSMENT AND PLAN:   53 year old female with a history of chronic diastolic heart failure and COPD who presented to the hospital due to abdominal pain/swelling/distention   Acute  ? Right-sided  CAP Patient stable from respiratory standpoint, on her chronic 2 L via nasal cannula and satting 100% Admit to observation unit on our pneumonia protocol, empiric Rocephin/azithromycin, follow-up on cultures  Acute abdominal pain Resolved.  Likely due to acute bronchitis. CT abdomen with no significant acute findings apart from concern for right lower lobe pneumonia which is being treated as mentioned above  Acute compensated chronic diastolic congestive heart failure No evidence for exacerbation; yet, patient believes she is fluid overloaded BNP minimally elevated, chest x-ray is clear Patient seen by cardiologist.  Continue diuresis with IV Lasix 40 mg twice daily.continue Lopressor, Seen by cardiologist.  Appreciate input. Recent 2D echocardiogram in December 2019 with ejection fraction of 50 to 55%.  A bioprosthesis was present in the mitral valve with mild regurgitation.  History of paroxysmal atrial fibrillation. Continue rate controlling agent with Lopressor. Patient on anticoagulation with Xarelto  COPD without exacerbation Stable Continue home regiment  Chronic tobacco smoking abuse/dependency  Nicotine patch with cessation counseling ordered   Chronic benign essential hypertension  Continue metoprolol  Chronic extreme morbid obesity Secondary to excess calories Lifestyle modification recommended  Acute on chronic pain syndrome Continue home regiment  All the records are reviewed and case discussed with ED provider. Management  plans discussed with the patient, family and they are in agreement.  CODE STATUS:full  DVT prophylaxis; patient on Xarelto already   TOTAL TIME TAKING CARE OF THIS PATIENT: 38 minutes.   More than 50% of the time was spent in counseling/coordination of care: YES  POSSIBLE D/C IN 1-2 DAYS, DEPENDING ON CLINICAL CONDITION.   Chelsea Ramsey M.D on 12/02/2018 at 6:08 PM  Between 7am to 6pm - Pager - [phone]  After 6pm go to [email or web address] - Proofreader  [employer] [facility]  Office  [phone]  CC: Primary care physician; Kathee Delton, MD  Note: This dictation was prepared with Dragon dictation along with smaller phrase technology. Any transcriptional errors that result from this process are unintentional.

## 2018-12-02 NOTE — Progress Notes (Signed)
Talked to Dr. Darvin Neighbours about patient's BP at 106/66, HR 92 patient is to received Metoprolol 100 mg, order ok to give. Patient is asymptomatic. RN will continue to monitor.

## 2018-12-02 NOTE — Consult Note (Signed)
[facility] Cardiology  CARDIOLOGY CONSULT NOTE  Patient ID: Chelsea Ramsey MRN: [id number] DOB/AGE: 1966-07-12 53 y.o.  Admit date: 12/01/2018 Referring Physician Salary Primary Physician Kathee Delton, MD  Primary Cardiologist [facility] Heart and Vascular Reason for Consultation CHF  HPI: 53 year old female referred for evaluation of acute on chronic diastolic congestive heart failure. The patient has a history of chronic diastolic congestive heart failure, pulmonary hypertension, status post tissue mitral valve replacement and pulmonary vein isolation in 05/2018, paroxysmal atrial fibrillation on Xarelto, status post ablation, hypertension, sleep apnea, COPD on chronic supplemental oxygen with ongoing tobacco abuse, history of substance abuse, and history of GI bleed. The patient was recently admitted on 11/17/18 for shortness of breath, abdominal distention, and 10-lb weight gain, sent to the ER from congestive heart failure clinic for evaluation, and was treated with IV Lasix, potassium was repleted, and received treatment for her COPD exacerbation. She reports that she continued to have abdominal distention and gained 2 pounds at the time of discharge. Her Lasix was switched to torsemide at discharge, but the patient reports no improvement in her abdominal distention, tenderness, and shortness of breath since then. She was given a three day prescription of metolazone, which she reported significant diuresis, but then developed nausea, vomiting, and overall unwell, with return of edema, so she presented to [facility] ER for evaluation. Abdominal CT revealed ground glass density in the right middle lobe, which could reflect edema or pneumonia, with no evidence of acute intra-abdominal or pelvic abnormality. Chest xray negative for effusion. 2D echocardiogram on 11/05/18 revealed LVEF 50-55% with mild mitral regurgitation, moderate tricuspid regurgitation, and mild to moderate aortic regurgitation. Labs during  this hospitalization were notable for troponin x 2 less than 0.03, WBC 14.2, BNP 259. She reports epigastric to lower sternal discomfort, as well as right upper back discomfort, both induced by movement, deep breathing, and palpation.   Review of systems complete and found to be negative unless listed above     Past Medical History:  Diagnosis Date  . Allergy    seasonal  . Anxiety   . Arthritis    Right Knee  . Asthma   . CHF (congestive heart failure) ([place])   . COPD (chronic obstructive pulmonary disease) ([place])   . Coronary artery disease    Leaky heart valve  . Fibromyalgia   . GERD (gastroesophageal reflux disease)   . Hypertension   . Pneumonia   . PUD (peptic ulcer disease)   . Pulmonary HTN ([place])   . Rheumatic fever/heart disease   . Sleep apnea     Past Surgical History:  Procedure Laterality Date  . ADENOIDECTOMY    . ESOPHAGOGASTRODUODENOSCOPY (EGD) WITH PROPOFOL N/A 05/25/2018   Procedure: ESOPHAGOGASTRODUODENOSCOPY (EGD) WITH PROPOFOL;  Surgeon: Lucilla Lame, MD;  Location: [facility] ENDOSCOPY;  Service: Endoscopy;  Laterality: N/A;  . MITRAL VALVE REPLACEMENT    . MULTIPLE TOOTH EXTRACTIONS    . TONSILLECTOMY    . TOTAL KNEE ARTHROPLASTY Right 09/04/2016   Procedure: TOTAL KNEE ARTHROPLASTY; with lateral release;  Surgeon: Earlie Server, MD;  Location: [facility];  Service: Orthopedics;  Laterality: Right;    (Not in a hospital admission)  Social History   Socioeconomic History  . Marital status: Married    Spouse name: Not on file  . Number of children: Not on file  . Years of education: Not on file  . Highest education level: Not on file  Occupational History  . Not on file  Social Needs  .  Financial resource strain: Not on file  . Food insecurity:    Worry: Not on file    Inability: Not on file  . Transportation needs:    Medical: Not on file    Non-medical: Not on file  Tobacco Use  . Smoking status: Current Every Day Smoker    Packs/day: 0.50     Types: Cigarettes  . Smokeless tobacco: Never Used  Substance and Sexual Activity  . Alcohol use: Not Currently    Alcohol/week: 3.0 standard drinks    Types: 3 Cans of beer per week    Comment: 16 oz per week  . 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 Sexual activity: Not on file  Lifestyle  . Physical activity:    Days per week: Not on file    Minutes per session: Not on file  . Stress: Not on file  Relationships  . Social connections:    Talks on phone: Not on file    Gets together: Not on file    Attends religious service: Not on file    Active member of club or organization: Not on file    Attends meetings of clubs or organizations: Not on file    Relationship status: Not on file  . Intimate partner violence:    Fear of current or ex partner: Not on file    Emotionally abused: Not on file    Physically abused: Not on file    Forced sexual activity: Not on file  Other Topics Concern  . Not on file  Social History Narrative  . Not on file    Family History  Problem Relation Age of Onset  . COPD Mother   . Cancer Mother        Bone  . Asthma Mother   . Congestive Heart Failure Father       Review of systems complete and found to be negative unless listed above      PHYSICAL EXAM  General: Well developed, well nourished, in no acute distress, lying supine in bed, on supplemental oxygen via [place] HEENT:  Normocephalic and atramatic Neck:  No JVD.  Lungs: Inspiratory wheezing, decreased breath sounds throughout, no significant crackles or rales Heart: HRRR . Normal S1 and S2 without gallops or murmurs.  Abdomen: abdominal distention with tenderness to palpation of epigastric region  Msk:  Back normal, able to sit upright without difficulty. No obvious deformity Extremities: No clubbing, cyanosis or edema.   Neuro: Alert and oriented X 3. Psych:  Good affect, responds appropriately  Labs:   Lab Results   Component Value Date   WBC 11.2 (H) 12/02/2018   HGB 10.4 (L) 12/02/2018   HCT 34.7 (L) 12/02/2018   MCV 83.8 12/02/2018   PLT 209 12/02/2018    Recent Labs  Lab 12/01/18 1835 12/02/18 0433  NA  --  139  K  --  3.5  CL  --  102  CO2  --  28  BUN  --  15  CREATININE  --  0.59  CALCIUM  --  8.5*  PROT 6.9  --   BILITOT 0.1*  --   ALKPHOS 116  --   ALT 20  --   AST 18  --   GLUCOSE  --  110*   Lab Results  Component Value Date   CKTOTAL 39 12/17/2013   CKMB 0.9 03/28/2013   TROPONINI <0.03 12/02/2018  Lab Results  Component Value Date   CHOL 96 04/03/2014   CHOL 91 12/09/2013   CHOL 145 06/02/2012   Lab Results  Component Value Date   HDL 32 (A) 04/03/2014   HDL 31 (L) 12/09/2013   HDL 66 (H) 06/02/2012   Lab Results  Component Value Date   LDLCALC 51 04/03/2014   LDLCALC 50 12/09/2013   LDLCALC 58 06/02/2012   Lab Results  Component Value Date   TRIG 63 04/03/2014   TRIG 50 12/09/2013   TRIG 106 06/02/2012   No results found for: CHOLHDL No results found for: LDLDIRECT    Radiology: Dg Chest 2 View  Result Date: 12/01/2018 CLINICAL DATA:  Worsening shortness of breath. Weight gain. Swelling. EXAM: CHEST - 2 VIEW COMPARISON:  None. FINDINGS: The heart is enlarged. Previous median sternotomy with valve replacement and atrial appendage clipping. Moderate vascular congestion. No definite consolidation or edema. No effusion or pneumothorax. Similar appearance to priors. IMPRESSION: Cardiomegaly. No definite active infiltrates or failure. Electronically Signed   By: Staci Righter M.D.   On: 12/01/2018 16:21   Ct Abdomen Pelvis W Contrast  Result Date: 12/01/2018 CLINICAL DATA:  Weight gain swelling in the abdomen and chest EXAM: CT ABDOMEN AND PELVIS WITH CONTRAST TECHNIQUE: Multidetector CT imaging of the abdomen and pelvis was performed using the standard protocol following bolus administration of intravenous contrast. CONTRAST:  167mL ISOVUE-300 IOPAMIDOL  (ISOVUE-300) INJECTION 61% COMPARISON:  None. FINDINGS: Lower chest: Ground-glass density in the right middle lobe. No pleural effusion. Mild cardiomegaly. Valve prosthesis. Hepatobiliary: No focal liver abnormality is seen. No gallstones, gallbladder wall thickening, or biliary dilatation. Pancreas: Unremarkable. No pancreatic ductal dilatation or surrounding inflammatory changes. Spleen: Normal in size without focal abnormality. Adrenals/Urinary Tract: Adrenal glands are unremarkable. Kidneys are normal, without renal calculi, focal lesion, or hydronephrosis. Bladder is unremarkable. Stomach/Bowel: Stomach is within normal limits. Appendix appears normal. No evidence of bowel wall thickening, distention, or inflammatory changes. Sigmoid colon diverticular disease without acute inflammatory process. Vascular/Lymphatic: Mild aortic atherosclerosis. No aneurysm. No significantly enlarged lymph nodes. Reproductive: Uterus and bilateral adnexa are unremarkable. Other: Negative for free air or free fluid. Musculoskeletal: No acute or significant osseous findings. IMPRESSION: 1. Ground-glass density in the right middle lobe, may reflect edema or pneumonia 2. No CT evidence for acute intra-abdominal or pelvic abnormality 3. Sigmoid colon diverticular disease without acute inflammatory change Electronically Signed   By: Donavan Foil M.D.   On: 12/01/2018 21:08   Dg Chest Port 1 View  Result Date: 11/19/2018 CLINICAL DATA:  Shortness of breath, mitral valve replacement EXAM: PORTABLE CHEST 1 VIEW COMPARISON:  11/17/2018 FINDINGS: Stable postop changes of the heart. Similar cardiomegaly with vascular congestion. No definite focal pneumonia, collapse or consolidation. No large effusion or pneumothorax. No current CHF. Improved lung volumes compared 11/17/2018. IMPRESSION: Cardiomegaly with vascular congestion.  Improved lung volumes. Electronically Signed   By: Jerilynn Mages.  Shick M.D.   On: 11/19/2018 11:50   Dg Chest Port 1  View  Result Date: 11/17/2018 CLINICAL DATA:  Chest pain. EXAM: PORTABLE CHEST 1 VIEW COMPARISON:  CT scan and radiograph of November 04, 2018. FINDINGS: Stable cardiomegaly with central pulmonary vascular congestion. Minimal bilateral pulmonary edema may be present. Hypoinflation of the lungs is noted. Status post cardiac valve repair. No pneumothorax or pleural effusion is noted. Bony thorax is unremarkable. IMPRESSION: Stable cardiomegaly with central pulmonary vascular congestion and possible bilateral pulmonary edema. Hypoinflation of the lungs is noted. Electronically Signed  By: Marijo Conception, M.D.   On: 11/17/2018 14:55   Dg Chest Portable 1 View  Result Date: 11/04/2018 CLINICAL DATA:  Dizziness with fall this morning. LEFT-sided chest pain 2 days ago. Total knee replacement 2017. Cardiac ablation surgery earlier this year. Additional history of asthma, COPD, CHF, coronary artery disease, hypertension, current smoker. EXAM: PORTABLE CHEST 1 VIEW COMPARISON:  Chest x-rays dated 10/11/2018 12/20/2017. FINDINGS: Median sternotomy wires appear intact and stable in alignment. Mitral valve replacement hardware and atrial clip appear grossly stable in position. Stable mild cardiomegaly. Mild peribronchial cuffing and subtle interstitial thickening again noted bilaterally suggesting chronic mild CHF. No evidence of overt alveolar pulmonary edema. No evidence of superimposed pneumonia. No pleural effusion or pneumothorax seen. No acute or suspicious osseous finding. IMPRESSION: 1. No active disease. No evidence of pneumonia or pulmonary edema. 2. Stable mild cardiomegaly. 3. Probable chronic mild CHF. Electronically Signed   By: Franki Cabot M.D.   On: 11/04/2018 11:19   Ct Angio Chest/abd/pel For Dissection W And/or Wo Contrast  Result Date: 11/04/2018 CLINICAL DATA:  53 year old female with a 2 day history of chest abdominal pain accompanied by presyncope. Clinical history includes atrial fibrillation  and prior cardiac surgeries. EXAM: CT ANGIOGRAPHY CHEST, ABDOMEN AND PELVIS TECHNIQUE: Multidetector CT imaging through the chest, abdomen and pelvis was performed using the standard protocol during bolus administration of intravenous contrast. Multiplanar reconstructed images and MIPs were obtained and reviewed to evaluate the vascular anatomy. CONTRAST:  166mL ISOVUE-370 IOPAMIDOL (ISOVUE-370) INJECTION 76% COMPARISON:  Most recent prior CT scan of the chest 07/17/2018; MRI lumbar spine 01/07/2018; prior chest CT 12/08/2013 FINDINGS: CTA CHEST FINDINGS Cardiovascular: Initial unenhanced CT imaging demonstrates no evidence of acute intramural hematoma. Following the administration of IV contrast, the thoracic aorta and pulmonary artery are both well opacified. Conventional 3 vessel arch anatomy. The aortic root, ascending, transverse and descending thoracic aorta are all normal in caliber. The main pulmonary artery is normal in caliber. No evidence of central filling defect to suggest acute PE. Surgical changes consistent with prior left atrial appendage ligation and mitral valvuloplasty. Mild cardiomegaly with biatrial enlargement. No pericardial effusion. Mediastinum/Nodes: Stable strandy soft tissue in the anterior mediastinum either representing the sequelae of prior surgical intervention, or residual thymic tissue. No significant interval change compared 07/17/2018. Unremarkable thoracic esophagus. No suspicious lymphadenopathy. Lungs/Pleura: Stable scattered bilateral calcified and noncalcified granulomas dating back to at least January of 2015. Additionally, there is a pattern diffuse reticulonodular airspace opacities predominantly in the bilateral upper lungs which also remains unchanged across several prior studies dating back to January of 2015. No definite acute airspace opacification, pulmonary edema, pleural effusion or pneumothorax. Stable mild diffuse bronchial wall thickening. Mild centrilobular  pulmonary emphysema. Musculoskeletal: No acute fracture or aggressive appearing lytic or blastic osseous lesion. Review of the MIP images confirms the above findings. CTA ABDOMEN AND PELVIS FINDINGS VASCULAR Aorta: Normal caliber aorta without aneurysm, dissection, vasculitis or significant stenosis. Celiac: Patent without evidence of aneurysm, dissection, vasculitis or significant stenosis. SMA: Patent without evidence of aneurysm, dissection, vasculitis or significant stenosis. Renals: Both renal arteries are patent without evidence of aneurysm, dissection, vasculitis, fibromuscular dysplasia or significant stenosis. IMA: Patent without evidence of aneurysm, dissection, vasculitis or significant stenosis. Inflow: Patent without evidence of aneurysm, dissection, vasculitis or significant stenosis. Veins: No obvious venous abnormality within the limitations of this arterial phase study. Review of the MIP images confirms the above findings. NON-VASCULAR Hepatobiliary: Normal hepatic contour and morphology. Diffuse low attenuation  of the hepatic parenchyma suggestive of underlying steatosis. No discrete hepatic lesions. Normal appearance of the gallbladder. No intra or extrahepatic biliary ductal dilatation. Pancreas: Unremarkable. No pancreatic ductal dilatation or surrounding inflammatory changes. Spleen: Normal in size without focal abnormality. Adrenals/Urinary Tract: Adrenal glands are unremarkable. Kidneys are normal, without renal calculi, focal lesion, or hydronephrosis. Bladder is unremarkable. Stomach/Bowel: Colonic diverticular disease without CT evidence of active inflammation. No evidence of obstruction or focal bowel wall thickening. Normal appendix in the right lower quadrant. The terminal ileum is unremarkable. Lymphatic: No suspicious lymphadenopathy. Reproductive: Uterus and bilateral adnexa are unremarkable. Other: No abdominal wall hernia or abnormality. No abdominopelvic ascites. Musculoskeletal:  No acute fracture or aggressive appearing lytic or blastic osseous lesion. Review of the MIP images confirms the above findings. IMPRESSION: CTA CHEST 1. Negative for acute vascular abnormality, central pulmonary embolus, pneumonia or other acute cardiopulmonary process. 2. Stable chronic bilateral reticulonodular airspace opacities and underlying emphysema. Differential considerations include chronic hypersensitivity pneumonitis, sarcoidosis, and sequelae of old granulomatous disease superimposed on mild centrilobular emphysema. 3. Stable bilateral benign pulmonary granulomas. 4. Stable mild cardiomegaly with biatrial enlargement. 5. Surgical changes of prior mitral valve annuloplasty. CTA ABD/PELVIS 1. No acute vascular abnormality within the abdomen or pelvis. 2. Hepatic steatosis. 3. Colonic diverticular disease without CT evidence of active inflammation. Electronically Signed   By: Jacqulynn Cadet M.D.   On: 11/04/2018 13:22   Dg Femur Min 2 Views Right  Result Date: 11/04/2018 CLINICAL DATA:  Fall this morning, dizziness. RIGHT upper leg pain. EXAM: RIGHT FEMUR 2 VIEWS COMPARISON:  None. FINDINGS: RIGHT knee arthroplasty hardware appears intact and appropriately position. Osseous alignment is normal. No fracture line or displaced fracture fragment seen. No appreciable joint effusion at the RIGHT knee joint. Soft tissues about the RIGHT femur are unremarkable. IMPRESSION: 1. Negative. 2. Hardware intact. Electronically Signed   By: Franki Cabot M.D.   On: 11/04/2018 11:16    EKG: sinus rhythm  ASSESSMENT AND PLAN:  1. Acute on chronic diastolic congestive heart failure, recently admitted for the same, refractory to oral diuretics, with significant diuresis with outpatient metolazone, though regained fluid when unable to complete prescription for metolazone due to nausea and vomiting. Currently receiving IV Lasix with increased urine output. 2. COPD on chronic supplemental oxygen with ongoing  tobacco abuse 3. Paroxsymal atrial fibrillation, on Xarelto for stroke prevention, rate controlled 4. Mitral valve stenosis, status post tissue valve replacement in 05/2018 5. OSA, noncompliant with CPAP 6. Possible pneumonia with pleuritic chest and back pain with chest CT revealing ground glass density in the right middle lobe, which could reflect edema or pneumonia. WBC elevated to 14.2. Pneumonia panel pending.  Recommendations: 1. Continue IV Lasix 40 mg BID with careful monitoring of renal status and electrolytes 2. Continue Xarelto for stroke prevention 3. Continue metoprolol tartrate for hypertension and rate control 4. Defer repeat echocardiogram as patient had one during prior hospitalization last month  Signed: Clabe Seal PA-C  12/02/2018, 2:02 PM

## 2018-12-02 NOTE — H&P (Addendum)
[place] [facility] [place] NAME: Chelsea Ramsey    MR#:  [id number]  DATE OF BIRTH:  1966/06/27  DATE OF ADMISSION:  12/01/2018  PRIMARY CARE PHYSICIAN: Kathee Delton, MD   REQUESTING/REFERRING PHYSICIAN:   CHIEF COMPLAINT:   Abdominal pain, abdominal distention/swelling  HISTORY OF PRESENT ILLNESS: Chelsea Ramsey  is a 53 y.o. female with a known history per below recent discharge from the hospital roughly 2 weeks ago for congestive heart failure, returns with abdominal pain with distention, patient states that she thinks that she is fluid overloaded from her heart failure and needs diuretics to get the fluid off similar to last admission, in the emergency room patient was found to have potassium of 3.4, BNP 259, white count 14,000, chest x-ray noted for cardiomegaly, CT abdomen noted for questionable right sided pneumonia versus edema, patient evaluated in the emergency room, no apparent distress, resting comfortably in bed, patient is satting 100% on her chronic 2 L nasal cannula oxygen, patient is now being admitted for acute possible ? CAP, compensated congestive heart failure, acute hypokalemia.  PAST MEDICAL HISTORY:   Past Medical History:  Diagnosis Date  . Allergy    seasonal  . Anxiety   . Arthritis    Right Knee  . Asthma   . CHF (congestive heart failure) ([place])   . COPD (chronic obstructive pulmonary disease) ([place])   . Coronary artery disease    Leaky heart valve  . Fibromyalgia   . GERD (gastroesophageal reflux disease)   . Hypertension   . Pneumonia   . PUD (peptic ulcer disease)   . Pulmonary HTN ([place])   . Rheumatic fever/heart disease   . Sleep apnea     PAST SURGICAL HISTORY:  Past Surgical History:  Procedure Laterality Date  . ADENOIDECTOMY    . ESOPHAGOGASTRODUODENOSCOPY (EGD) WITH PROPOFOL N/A 05/25/2018   Procedure: ESOPHAGOGASTRODUODENOSCOPY (EGD) WITH PROPOFOL;  Surgeon: Lucilla Lame, MD;  Location: [facility]  ENDOSCOPY;  Service: Endoscopy;  Laterality: N/A;  . MITRAL VALVE REPLACEMENT    . MULTIPLE TOOTH EXTRACTIONS    . TONSILLECTOMY    . TOTAL KNEE ARTHROPLASTY Right 09/04/2016   Procedure: TOTAL KNEE ARTHROPLASTY; with lateral release;  Surgeon: Earlie Server, MD;  Location: [place];  Service: Orthopedics;  Laterality: Right;    SOCIAL HISTORY:  Social History   Tobacco Use  . Smoking status: Current Every Day Smoker    Packs/day: 0.50    Types: Cigarettes  . Smokeless tobacco: Never Used  Substance Use Topics  . Alcohol use: Not Currently    Alcohol/week: 3.0 standard drinks    Types: 3 Cans of beer per week    Comment: 16 oz per week    FAMILY HISTORY:  Family History  Problem Relation Age of Onset  . COPD Mother   . Cancer Mother        Bone  . Asthma Mother   . Congestive Heart Failure Father     DRUG ALLERGIES:  Allergies  Allergen Reactions  . Amiodarone Nausea And Vomiting  . Aspirin Swelling  . Flexeril [Cyclobenzaprine] Swelling  . Trazamine [Trazodone & Diet Manage Prod] Nausea And Vomiting  . Codeine Rash  . Tramadol Rash    REVIEW OF SYSTEMS:   CONSTITUTIONAL: No fever, fatigue or weakness.  EYES: No blurred or double vision.  EARS, NOSE, AND THROAT: No tinnitus or ear pain.  RESPIRATORY: No cough, shortness of breath, wheezing or hemoptysis.  CARDIOVASCULAR: No chest  pain, orthopnea, edema.  GASTROINTESTINAL: No nausea, vomiting, diarrhea + abdominal distention/pain/swelling   GENITOURINARY: No dysuria, hematuria.  ENDOCRINE: No polyuria, nocturia,  HEMATOLOGY: No anemia, easy bruising or bleeding SKIN: No rash or lesion. MUSCULOSKELETAL: No joint pain or arthritis.   NEUROLOGIC: No tingling, numbness, weakness.  PSYCHIATRY: No anxiety or depression.   MEDICATIONS AT HOME:  Prior to Admission medications   Medication Sig Start Date End Date Taking? Authorizing Provider  bisacodyl (DULCOLAX) 5 MG EC tablet Take 2 tablets (10 mg total) by mouth  daily as needed for moderate constipation. 07/21/18  Yes Epifanio Lesches, MD  budesonide-formoterol (SYMBICORT) 160-4.5 MCG/ACT inhaler Inhale 2 puffs into the lungs 2 (two) times daily. 02/26/16  Yes [provider]  butalbital-acetaminophen-caffeine (FIORICET, ESGIC) 50-325-40 MG tablet Take 1-2 tablets by mouth every 6 (six) hours as needed for headache. 10/11/18 10/11/19 Yes Veronese, [place], MD  Calcium Carbonate-Vit D-Min [facility] CALCIUM 1200) 1200-1000 MG-UNIT CHEW Chew 1,200 mg by mouth daily with breakfast. Take in combination with vitamin D and magnesium. 11/14/18 05/13/19 Yes Milinda Pointer, MD  Cholecalciferol (VITAMIN D3) 125 MCG (5000 UT) CAPS Take 1 capsule (5,000 Units total) by mouth daily with breakfast. Take along with calcium and magnesium. 11/14/18 05/13/19 Yes Milinda Pointer, MD  ergocalciferol (VITAMIN D2) 1.25 MG (50000 UT) capsule Take 1 capsule (50,000 Units total) by mouth 2 (two) times a week. X 6 weeks. 11/14/18 12/26/18 Yes Milinda Pointer, MD  gabapentin (NEURONTIN) 300 MG capsule Take 600 mg by mouth 3 (three) times daily.  07/22/15  Yes [provider]  ipratropium-albuterol (DUONEB) 0.5-2.5 (3) MG/3ML SOLN Inhale 3 mLs into the lungs 3 (three) times daily as needed (respiratory).  02/26/16 12/20/18 Yes [provider]  lactulose (CHRONULAC) 10 GM/15ML solution Take 45 mLs (30 g total) by mouth 2 (two) times daily as needed for mild constipation. 07/21/18  Yes Epifanio Lesches, MD  Magnesium 500 MG CAPS Take 1 capsule (500 mg total) by mouth 2 (two) times daily at 8 am and 10 pm. 11/14/18 05/13/19 Yes Milinda Pointer, MD  magnesium oxide (MAG-OX) 400 MG tablet Take 400 mg by mouth 2 (two) times daily. 07/11/18 07/11/19 Yes [provider]  meclizine (ANTIVERT) 12.5 MG tablet Take 25 mg by mouth 3 (three) times daily as needed for dizziness.   Yes [provider]  metoprolol tartrate (LOPRESSOR) 100 MG tablet Take 1  tablet (100 mg total) by mouth 2 (two) times daily. 11/05/18  Yes Mody, Ulice Bold, MD  nicotine (NICODERM CQ - DOSED IN MG/24 HR) 7 mg/24hr patch Place 1 patch (7 mg total) onto the skin daily. 11/06/18  Yes Mody, Ulice Bold, MD  nitroGLYCERIN (NITROSTAT) 0.4 MG SL tablet Place 1 tablet (0.4 mg total) under the tongue every 5 (five) minutes as needed for chest pain. 11/05/18  Yes Mody, Ulice Bold, MD  omeprazole (PRILOSEC) 40 MG capsule Take 40 mg by mouth 2 (two) times daily.   Yes [provider]  Oxycodone HCl 10 MG TABS Take 0.5-1 tablets (5-10 mg total) by mouth 2 (two) times daily as needed. Must last 30 days. MAX.: 2/day Patient taking differently: Take 5-10 mg by mouth 3 (three) times daily as needed. Must last 30 days. MAX.: 2/day 11/21/18 12/21/18 Yes Milinda Pointer, MD  promethazine (PHENERGAN) 12.5 MG tablet Take 1 tablet (12.5 mg total) by mouth every 6 (six) hours as needed for nausea or vomiting. 11/06/18  Yes Mody, Ulice Bold, MD  rivaroxaban (XARELTO) 20 MG TABS tablet Take 20 mg by  mouth every evening.  07/11/18 07/11/19 Yes [provider]  tiZANidine (ZANAFLEX) 4 MG tablet Take 4 mg by mouth at bedtime.  08/10/18  Yes [provider]  topiramate (TOPAMAX) 25 MG tablet Take 25 mg by mouth 2 (two) times daily.  08/10/18  Yes [provider]  torsemide (DEMADEX) 20 MG tablet Take 1 tablet (20 mg total) by mouth 2 (two) times daily. 11/19/18  Yes Harrie Cazarez, Avel Peace, MD  guaiFENesin (MUCINEX) 600 MG 12 hr tablet Take 1 tablet (600 mg total) by mouth 2 (two) times daily. Patient not taking: Reported on 12/01/2018 11/19/18   Kala Ambriz, Holly Bodily D, MD  potassium chloride SA (K-DUR,KLOR-CON) 20 MEQ tablet Take 1 tablet (20 mEq total) by mouth daily. While taking metolazone Patient not taking: Reported on 12/01/2018 11/21/18   Darylene Price A, FNP      PHYSICAL EXAMINATION:   VITAL SIGNS: Blood pressure (!) 131/91, pulse 95, temperature 98.4 F (36.9 C), temperature source Oral,  resp. rate 20, height 5\' 4"  (1.626 m), weight 122.5 kg, last menstrual period 11/08/2009, SpO2 98 %.  GENERAL:  53 y.o.-year-old patient lying in the bed with no acute distress.  Extreme morbid obesity, nontoxic-appearing  eYES: Pupils equal, round, reactive to light and accommodation. No scleral icterus. Extraocular muscles intact.  HEENT: Head atraumatic, normocephalic. Oropharynx and nasopharynx clear.  NECK:  Supple, no jugular venous distention. No thyroid enlargement, no tenderness.  LUNGS: Normal breath sounds bilaterally, no wheezing, rales,rhonchi or crepitation. No use of accessory muscles of respiration.  CARDIOVASCULAR: S1, S2 normal. No murmurs, rubs, or gallops.  ABDOMEN: Soft, nontender, nondistended. Bowel sounds present. No organomegaly or mass.  EXTREMITIES: No pedal edema, cyanosis, or clubbing.  NEUROLOGIC: Cranial nerves II through XII are intact. MAES. Gait not checked.  PSYCHIATRIC: The patient is alert and oriented x 3.  SKIN: No obvious rash, lesion, or ulcer.   LABORATORY PANEL:   CBC Recent Labs  Lab 12/01/18 1602  WBC 14.2*  HGB 11.1*  HCT 36.4  PLT 231  MCV 83.1  MCH 25.3*  MCHC 30.5  RDW 16.2*   ------------------------------------------------------------------------------------------------------------------  Chemistries  Recent Labs  Lab 12/01/18 1602 12/01/18 1835  NA 140  --   K 3.4*  --   CL 102  --   CO2 31  --   GLUCOSE 98  --   BUN 19  --   CREATININE 0.67  --   CALCIUM 8.9  --   AST  --  18  ALT  --  20  ALKPHOS  --  116  BILITOT  --  0.1*   ------------------------------------------------------------------------------------------------------------------ estimated creatinine clearance is 107.4 mL/min (by C-G formula based on SCr of 0.67 mg/dL). ------------------------------------------------------------------------------------------------------------------ No results for input(s): TSH, T4TOTAL, T3FREE, THYROIDAB in the last 72  hours.  Invalid input(s): FREET3   Coagulation profile No results for input(s): INR, PROTIME in the last 168 hours. ------------------------------------------------------------------------------------------------------------------- No results for input(s): DDIMER in the last 72 hours. -------------------------------------------------------------------------------------------------------------------  Cardiac Enzymes Recent Labs  Lab 12/01/18 1602  TROPONINI <0.03   ------------------------------------------------------------------------------------------------------------------ Invalid input(s): POCBNP  ---------------------------------------------------------------------------------------------------------------  Urinalysis    Component Value Date/Time   COLORURINE YELLOW (A) 07/17/2018 1558   APPEARANCEUR CLEAR (A) 07/17/2018 1558   APPEARANCEUR CLEAR 02/20/2015 1015   LABSPEC 1.019 07/17/2018 1558   LABSPEC 1.016 02/20/2015 1015   PHURINE 7.0 07/17/2018 1558   GLUCOSEU NEGATIVE 07/17/2018 1558   GLUCOSEU NEGATIVE 02/20/2015 1015   HGBUR NEGATIVE 07/17/2018 1558   BILIRUBINUR NEGATIVE 07/17/2018 1558  BILIRUBINUR NEGATIVE 02/20/2015 [place] 07/17/2018 1558   PROTEINUR NEGATIVE 07/17/2018 1558   NITRITE NEGATIVE 07/17/2018 1558   LEUKOCYTESUR MODERATE (A) 07/17/2018 1558   LEUKOCYTESUR 2+ 02/20/2015 1015     RADIOLOGY: Dg Chest 2 View  Result Date: 12/01/2018 CLINICAL DATA:  Worsening shortness of breath. Weight gain. Swelling. EXAM: CHEST - 2 VIEW COMPARISON:  None. FINDINGS: The heart is enlarged. Previous median sternotomy with valve replacement and atrial appendage clipping. Moderate vascular congestion. No definite consolidation or edema. No effusion or pneumothorax. Similar appearance to priors. IMPRESSION: Cardiomegaly. No definite active infiltrates or failure. Electronically Signed   By: Staci Righter M.D.   On: 12/01/2018 16:21   Ct Abdomen  Pelvis W Contrast  Result Date: 12/01/2018 CLINICAL DATA:  Weight gain swelling in the abdomen and chest EXAM: CT ABDOMEN AND PELVIS WITH CONTRAST TECHNIQUE: Multidetector CT imaging of the abdomen and pelvis was performed using the standard protocol following bolus administration of intravenous contrast. CONTRAST:  1107mL ISOVUE-300 IOPAMIDOL (ISOVUE-300) INJECTION 61% COMPARISON:  None. FINDINGS: Lower chest: Ground-glass density in the right middle lobe. No pleural effusion. Mild cardiomegaly. Valve prosthesis. Hepatobiliary: No focal liver abnormality is seen. No gallstones, gallbladder wall thickening, or biliary dilatation. Pancreas: Unremarkable. No pancreatic ductal dilatation or surrounding inflammatory changes. Spleen: Normal in size without focal abnormality. Adrenals/Urinary Tract: Adrenal glands are unremarkable. Kidneys are normal, without renal calculi, focal lesion, or hydronephrosis. Bladder is unremarkable. Stomach/Bowel: Stomach is within normal limits. Appendix appears normal. No evidence of bowel wall thickening, distention, or inflammatory changes. Sigmoid colon diverticular disease without acute inflammatory process. Vascular/Lymphatic: Mild aortic atherosclerosis. No aneurysm. No significantly enlarged lymph nodes. Reproductive: Uterus and bilateral adnexa are unremarkable. Other: Negative for free air or free fluid. Musculoskeletal: No acute or significant osseous findings. IMPRESSION: 1. Ground-glass density in the right middle lobe, may reflect edema or pneumonia 2. No CT evidence for acute intra-abdominal or pelvic abnormality 3. Sigmoid colon diverticular disease without acute inflammatory change Electronically Signed   By: Donavan Foil M.D.   On: 12/01/2018 21:08    EKG: Orders placed or performed during the hospital encounter of 12/01/18  . ED EKG within 10 minutes  . ED EKG within 10 minutes    IMPRESSION AND PLAN: 53 year old female with a history of chronic diastolic  heart failure and COPD who presented to the hospital due to abdominal pain/swelling/distention   *Acute  ? Right-sided  CAP Patient stable from respiratory standpoint, on her chronic 2 L via nasal cannula and satting 100% Admit to observation unit on our pneumonia protocol, empiric Rocephin/azithromycin, follow-up on cultures  *Acute abdominal pain CT abdomen noted for ?  Right pneumonia versus edema, favor the former-treatment per above Patient is convinced that her abdomen is full of fluid due to heart failure-clinically this is not evident  *Acute compensated chronic diastolic congestive heart failure No evidence for exacerbation; yet, patient believes she is fluid overloaded BNP minimally elevated, chest x-ray is clear IV Lasix for now with quick taper, on Xarelto, continue Lopressor, cardiology consultation for expert opinion, strict I&O monitoring, daily weights, and continue close medical monitoring Note echocardiogram from August 2019 noted for ejection fraction 50-55%, echocardiogram from last month still has not been read-please follow-up on this report  *COPD without exacerbation Stable Continue home regiment  *Chronic tobacco smoking abuse/dependency  Nicotine patch with cessation counseling ordered   *Chronic benign essential hypertension  Continue metoprolol  *Acute hypokalemia Replete with p.o. potassium and check magnesium  level in the morning  *Chronic extreme morbid obesity Secondary to excess calories Lifestyle modification recommended  *Acute on chronic pain syndrome Continue home regiment ?  Drug-seeking behavior  All the records are reviewed and case discussed with ED provider. Management plans discussed with the patient, family and they are in agreement.  CODE STATUS:full    Code Status Orders  (From admission, onward)         Start     Ordered   12/02/18 0045  Full code  Continuous     12/02/18 0044        Code Status History    Date  Active Date Inactive Code Status Order ID Comments User Context   11/17/2018 1954 11/19/2018 1911 Full Code [id number]  Dustin Flock, MD Inpatient   11/04/2018 1601 11/06/2018 1358 Full Code [id number]  Nicholes Mango, MD Inpatient   05/22/2018 1222 05/25/2018 2208 Full Code [id number]  Demetrios Loll, MD Inpatient   04/22/2018 1440 04/25/2018 1736 Full Code [id number]  Epifanio Lesches, MD ED   12/20/2017 1946 12/21/2017 1447 Full Code [id number]  Gorden Harms, MD ED   09/04/2016 1449 09/05/2016 1715 Full Code [id number]  Chriss Czar, PA-C Inpatient       TOTAL TIME TAKING CARE OF THIS PATIENT: 40 minutes.    Avel Peace Melaya [place]elton M.D on 12/02/2018   Between 7am to 6pm - Pager - [phone]  After 6pm go to [email or web address] - password [email or web address]  Office  [phone]  CC: Primary care physician; Kathee Delton, MD   Note: This dictation was prepared with Dragon dictation along with smaller phrase technology. Any transcriptional errors that result from this process are unintentional.

## 2018-12-03 LAB — BASIC METABOLIC PANEL
Anion gap: 7 (ref 5–15)
BUN: 15 mg/dL (ref 6–20)
CO2: 30 mmol/L (ref 22–32)
Calcium: 8.7 mg/dL — ABNORMAL LOW (ref 8.9–10.3)
Chloride: 101 mmol/L (ref 98–111)
Creatinine, Ser: 0.55 mg/dL (ref 0.44–1.00)
GFR calc Af Amer: >60 mL/min (ref >60)
GFR calc non Af Amer: >60 mL/min (ref >60)
Glucose, Bld: 106 mg/dL — ABNORMAL HIGH (ref 70–99)
Potassium: 3.8 mmol/L (ref 3.5–5.1)
Sodium: 138 mmol/L (ref 135–145)

## 2018-12-03 LAB — CBC
HCT: 36.4 % (ref 36.0–46.0)
Hemoglobin: 10.8 g/dL — ABNORMAL LOW (ref 12.0–15.0)
MCH: 25.7 pg — ABNORMAL LOW (ref 26.0–34.0)
MCHC: 29.7 g/dL — AB (ref 30.0–36.0)
MCV: 86.5 fL (ref 80.0–100.0)
Platelets: 204 10*3/uL (ref 150–400)
RBC: 4.21 MIL/uL (ref 3.87–5.11)
RDW: 15.7 % — ABNORMAL HIGH (ref 11.5–15.5)
WBC: 9.8 10*3/uL (ref 4.0–10.5)
nRBC: 0 % (ref 0.0–0.2)

## 2018-12-03 LAB — MAGNESIUM: Magnesium: 2.1 mg/dL (ref 1.7–2.4)

## 2018-12-03 LAB — PHOSPHORUS: Phosphorus: 4.1 mg/dL (ref 2.5–4.6)

## 2018-12-03 LAB — HIV ANTIBODY (ROUTINE TESTING W REFLEX): HIV SCREEN 4TH GENERATION: NONREACTIVE

## 2018-12-03 MED ORDER — BENZONATATE 100 MG PO CAPS
100.0000 mg | ORAL_CAPSULE | Freq: Three times a day (TID) | ORAL | Status: DC | PRN
  Administered 2018-12-05: 100 mg via ORAL
  Filled 2018-12-03: qty 1

## 2018-12-03 MED ORDER — BUDESONIDE 0.5 MG/2ML IN SUSP
0.5000 mg | Freq: Two times a day (BID) | RESPIRATORY_TRACT | Status: DC
  Administered 2018-12-03 – 2018-12-06 (×5): 0.5 mg via RESPIRATORY_TRACT
  Filled 2018-12-03 (×5): qty 2

## 2018-12-03 MED ORDER — FUROSEMIDE 10 MG/ML IJ SOLN: 40.0000 mg | Freq: Two times a day (BID) | INTRAMUSCULAR | Status: DC

## 2018-12-03 MED ORDER — IPRATROPIUM-ALBUTEROL 0.5-2.5 (3) MG/3ML IN SOLN
3.0000 mL | Freq: Four times a day (QID) | RESPIRATORY_TRACT | Status: DC
  Administered 2018-12-03 – 2018-12-06 (×12): 3 mL via RESPIRATORY_TRACT
  Filled 2018-12-03 (×12): qty 3

## 2018-12-03 MED ORDER — TORSEMIDE 20 MG PO TABS: 20.0000 mg | ORAL_TABLET | Freq: Two times a day (BID) | ORAL | Status: DC

## 2018-12-03 MED ORDER — METHYLPREDNISOLONE SODIUM SUCC 40 MG IJ SOLR
40.0000 mg | Freq: Three times a day (TID) | INTRAMUSCULAR | Status: DC
  Administered 2018-12-03 – 2018-12-04 (×3): 40 mg via INTRAVENOUS
  Filled 2018-12-03 (×3): qty 1

## 2018-12-03 MED ORDER — FUROSEMIDE 10 MG/ML IJ SOLN
40.0000 mg | Freq: Two times a day (BID) | INTRAMUSCULAR | Status: DC
  Administered 2018-12-03 – 2018-12-04 (×2): 40 mg via INTRAVENOUS
  Filled 2018-12-03 (×2): qty 4

## 2018-12-03 MED ORDER — MENTHOL 3 MG MT LOZG
1.0000 | LOZENGE | OROMUCOSAL | Status: DC | PRN
  Administered 2018-12-05: 3 mg via ORAL
  Filled 2018-12-03: qty 9

## 2018-12-03 NOTE — Progress Notes (Signed)
[place] at [place] NAME: Chelsea Ramsey    MR#:  [id number]  DATE OF BIRTH:  [birth year]  SUBJECTIVE:   Patient admitted to the hospital secondary to shortness of breath, lower extremity edema, cough and noted to have acute respiratory failure with hypoxia secondary to COPD exacerbation and mild CHF.  Still complains of a cough which is nonproductive.  REVIEW OF SYSTEMS:    Review of Systems  Constitutional: Negative for chills and fever.  HENT: Negative for congestion and tinnitus.   Eyes: Negative for blurred vision and double vision.  Respiratory: Positive for cough and shortness of breath. Negative for wheezing.   Cardiovascular: Positive for leg swelling. Negative for chest pain, orthopnea and PND.  Gastrointestinal: Negative for abdominal pain, diarrhea, nausea and vomiting.  Genitourinary: Negative for dysuria and hematuria.  Neurological: Negative for dizziness, sensory change and focal weakness.  All other systems reviewed and are negative.   Nutrition: Heart healthy Tolerating Diet: Yes Tolerating PT: Await Eval.   DRUG ALLERGIES:   Allergies  Allergen Reactions  . Amiodarone Nausea And Vomiting  . Aspirin Swelling  . Flexeril [Cyclobenzaprine] Swelling  . Trazamine [Trazodone & Diet Manage Prod] Nausea And Vomiting  . Codeine Rash  . Tramadol Rash    VITALS:  Blood pressure 106/73, pulse 77, temperature 97.8 F (36.6 C), temperature source Oral, resp. rate 19, height 5\' 4"  (1.626 m), weight 122.6 kg, last menstrual period 11/08/2009, SpO2 97 %.  PHYSICAL EXAMINATION:   Physical Exam  GENERAL:  53 y.o.-year-old obese patient lying in bed in no acute distress.  EYES: Pupils equal, round, reactive to light and accommodation. No scleral icterus. Extraocular muscles intact.  HEENT: Head atraumatic, normocephalic. Oropharynx and nasopharynx clear.  NECK:  Supple, no jugular venous distention. No thyroid  enlargement, no tenderness.  LUNGS: Prolonged inspiratory and expiratory phase, end expiratory wheezing bilaterally, minimal Rales, no rhonchi, negative use of accessory muscles. CARDIOVASCULAR: S1, S2 normal. No murmurs, rubs, or gallops.  ABDOMEN: Soft, nontender, nondistended. Bowel sounds present. No organomegaly or mass.  EXTREMITIES: No cyanosis, clubbing, + 1 edema b/l NEUROLOGIC: Cranial nerves II through XII are intact. No focal Motor or sensory deficits b/l.   PSYCHIATRIC: The patient is alert and oriented x 3.  SKIN: No obvious rash, lesion, or ulcer.    LABORATORY PANEL:   CBC Recent Labs  Lab 12/03/18 0427  WBC 9.8  HGB 10.8*  HCT 36.4  PLT 204   ------------------------------------------------------------------------------------------------------------------  Chemistries  Recent Labs  Lab 12/01/18 1835  12/03/18 0427  NA  --    < > 138  K  --    < > 3.8  CL  --    < > 101  CO2  --    < > 30  GLUCOSE  --    < > 106*  BUN  --    < > 15  CREATININE  --    < > 0.55  CALCIUM  --    < > 8.7*  MG  --   --  2.1  AST 18  --   --   ALT 20  --   --   ALKPHOS 116  --   --   BILITOT 0.1*  --   --    < > = values in this interval not displayed.   ------------------------------------------------------------------------------------------------------------------  Cardiac Enzymes Recent Labs  Lab 12/02/18 1541  TROPONINI <0.03   ------------------------------------------------------------------------------------------------------------------  RADIOLOGY:  Dg Chest 2 View  Result Date: 12/01/2018 CLINICAL DATA:  Worsening shortness of breath. Weight gain. Swelling. EXAM: CHEST - 2 VIEW COMPARISON:  None. FINDINGS: The heart is enlarged. Previous median sternotomy with valve replacement and atrial appendage clipping. Moderate vascular congestion. No definite consolidation or edema. No effusion or pneumothorax. Similar appearance to priors. IMPRESSION: Cardiomegaly. No  definite active infiltrates or failure. Electronically Signed   By: Staci Righter M.D.   On: 12/01/2018 16:21   Ct Abdomen Pelvis W Contrast  Result Date: 12/01/2018 CLINICAL DATA:  Weight gain swelling in the abdomen and chest EXAM: CT ABDOMEN AND PELVIS WITH CONTRAST TECHNIQUE: Multidetector CT imaging of the abdomen and pelvis was performed using the standard protocol following bolus administration of intravenous contrast. CONTRAST:  180mL ISOVUE-300 IOPAMIDOL (ISOVUE-300) INJECTION 61% COMPARISON:  None. FINDINGS: Lower chest: Ground-glass density in the right middle lobe. No pleural effusion. Mild cardiomegaly. Valve prosthesis. Hepatobiliary: No focal liver abnormality is seen. No gallstones, gallbladder wall thickening, or biliary dilatation. Pancreas: Unremarkable. No pancreatic ductal dilatation or surrounding inflammatory changes. Spleen: Normal in size without focal abnormality. Adrenals/Urinary Tract: Adrenal glands are unremarkable. Kidneys are normal, without renal calculi, focal lesion, or hydronephrosis. Bladder is unremarkable. Stomach/Bowel: Stomach is within normal limits. Appendix appears normal. No evidence of bowel wall thickening, distention, or inflammatory changes. Sigmoid colon diverticular disease without acute inflammatory process. Vascular/Lymphatic: Mild aortic atherosclerosis. No aneurysm. No significantly enlarged lymph nodes. Reproductive: Uterus and bilateral adnexa are unremarkable. Other: Negative for free air or free fluid. Musculoskeletal: No acute or significant osseous findings. IMPRESSION: 1. Ground-glass density in the right middle lobe, may reflect edema or pneumonia 2. No CT evidence for acute intra-abdominal or pelvic abnormality 3. Sigmoid colon diverticular disease without acute inflammatory change Electronically Signed   By: Donavan Foil M.D.   On: 12/01/2018 21:08     ASSESSMENT AND PLAN:   53 year old female with past medical history of obesity, obstructive  sleep apnea, ongoing tobacco abuse, COPD, chronic diastolic CHF, neuropathy, history of migraines, chronic pain who presented to the hospital due to shortness of breath.  1.  Acute on chronic respiratory failure with hypoxia-secondary to CHF/COPD. - Continue diuresis with IV Lasix for underlying CHF. - Still having some wheezing, and will start some IV steroids, scheduled duo nebs and Pulmicort nebs.  2.  CHF-acute on chronic diastolic dysfunction.  Patient was on oral torsemide but apparently was not responding to it. -Continue IV Lasix, follow I's and O's and daily weights.  Continue metoprolol.  3.  COPD- mild acute exacerbation secondary to ongoing tobacco abuse and also suspected pneumonia. - We will having some wheezing, bronchospasm.  We will add some IV steroids, placed on scheduled duo nebs, add Pulmicort nebs. -Continue IV ceftriaxone, Zithromax.  4.  Tobacco abuse-continue nicotine patch.  5.  Essential hypertension-continue Lopressor.  6.  Paroxysmal atrial fibrillation-rate controlled.  Continue metoprolol.  Continue Xarelto.  7.  Chronic pain-continue patient's oxycodone. -Patient has high drug-seeking behavior.     All the records are reviewed and case discussed with Care Management/Social Worker. Management plans discussed with the patient, family and they are in agreement.  CODE STATUS: Full code  DVT Prophylaxis: Xarelto  TOTAL TIME TAKING CARE OF THIS PATIENT: 30 minutes.   POSSIBLE D/C IN 1-2 DAYS, DEPENDING ON CLINICAL CONDITION.   Henreitta Leber M.D on 12/03/2018 at 3:46 PM  Between 7am to 6pm - Pager - [phone]  After 6pm go to [email or web address] - [place]  [facility]  Office  [phone]  CC: Primary care physician; Kathee Delton, MD

## 2018-12-03 NOTE — Progress Notes (Signed)
[facility] Cardiology  SUBJECTIVE: Patient sitting on side of bed, playing a shortness of breath with wheezing   Vitals:   12/02/18 2354 12/03/18 0446 12/03/18 0500 12/03/18 0820  BP: 119/78  113/81 106/73  Pulse: 89  78 77  Resp:   20 19  Temp:   98.7 F (37.1 C) 97.8 F (36.6 C)  TempSrc:   Oral Oral  SpO2:   100% 97%  Weight:  122.6 kg    Height:         Intake/Output Summary (Last 24 hours) at 12/03/2018 1113 Last data filed at 12/03/2018 0959 Gross per 24 hour  Intake 355.23 ml  Output 900 ml  Net -544.77 ml      PHYSICAL EXAM  General: Well developed, well nourished, in no acute distress HEENT:  Normocephalic and atramatic Neck:  No JVD.  Lungs: Scattered wheezing Heart: HRRR . Normal S1 and S2 without gallops or murmurs.  Abdomen: Bowel sounds are positive, abdomen soft and non-tender  Msk:  Back normal, normal gait. Normal strength and tone for age. Extremities: 1+ bilateral pedal edema Neuro: Alert and oriented X 3. Psych:  Good affect, responds appropriately   LABS: Basic Metabolic Panel: Recent Labs    12/02/18 0433 12/03/18 0427  NA 139 138  K 3.5 3.8  CL 102 101  CO2 28 30  GLUCOSE 110* 106*  BUN 15 15  CREATININE 0.59 0.55  CALCIUM 8.5* 8.7*  MG  --  2.1  PHOS  --  4.1   Liver Function Tests: Recent Labs    12/01/18 1835  AST 18  ALT 20  ALKPHOS 116  BILITOT 0.1*  PROT 6.9  ALBUMIN 3.7   Recent Labs    12/01/18 1835  LIPASE 37   CBC: Recent Labs    12/02/18 0433 12/03/18 0427  WBC 11.2* 9.8  HGB 10.4* 10.8*  HCT 34.7* 36.4  MCV 83.8 86.5  PLT 209 204   Cardiac Enzymes: Recent Labs    12/01/18 1602 12/02/18 0433 12/02/18 1541  TROPONINI <0.03 <0.03 <0.03   BNP: Invalid input(s): POCBNP D-Dimer: No results for input(s): DDIMER in the last 72 hours. Hemoglobin A1C: No results for input(s): HGBA1C in the last 72 hours. Fasting Lipid Panel: No results for input(s): CHOL, HDL, LDLCALC, TRIG, CHOLHDL, LDLDIRECT in the  last 72 hours. Thyroid Function Tests: No results for input(s): TSH, T4TOTAL, T3FREE, THYROIDAB in the last 72 hours.  Invalid input(s): FREET3 Anemia Panel: No results for input(s): VITAMINB12, FOLATE, FERRITIN, TIBC, IRON, RETICCTPCT in the last 72 hours.  Dg Chest 2 View  Result Date: 12/01/2018 CLINICAL DATA:  Worsening shortness of breath. Weight gain. Swelling. EXAM: CHEST - 2 VIEW COMPARISON:  None. FINDINGS: The heart is enlarged. Previous median sternotomy with valve replacement and atrial appendage clipping. Moderate vascular congestion. No definite consolidation or edema. No effusion or pneumothorax. Similar appearance to priors. IMPRESSION: Cardiomegaly. No definite active infiltrates or failure. Electronically Signed   By: Staci Righter M.D.   On: 12/01/2018 16:21   Ct Abdomen Pelvis W Contrast  Result Date: 12/01/2018 CLINICAL DATA:  Weight gain swelling in the abdomen and chest EXAM: CT ABDOMEN AND PELVIS WITH CONTRAST TECHNIQUE: Multidetector CT imaging of the abdomen and pelvis was performed using the standard protocol following bolus administration of intravenous contrast. CONTRAST:  130mL ISOVUE-300 IOPAMIDOL (ISOVUE-300) INJECTION 61% COMPARISON:  None. FINDINGS: Lower chest: Ground-glass density in the right middle lobe. No pleural effusion. Mild cardiomegaly. Valve prosthesis. Hepatobiliary: No focal liver  abnormality is seen. No gallstones, gallbladder wall thickening, or biliary dilatation. Pancreas: Unremarkable. No pancreatic ductal dilatation or surrounding inflammatory changes. Spleen: Normal in size without focal abnormality. Adrenals/Urinary Tract: Adrenal glands are unremarkable. Kidneys are normal, without renal calculi, focal lesion, or hydronephrosis. Bladder is unremarkable. Stomach/Bowel: Stomach is within normal limits. Appendix appears normal. No evidence of bowel wall thickening, distention, or inflammatory changes. Sigmoid colon diverticular disease without acute  inflammatory process. Vascular/Lymphatic: Mild aortic atherosclerosis. No aneurysm. No significantly enlarged lymph nodes. Reproductive: Uterus and bilateral adnexa are unremarkable. Other: Negative for free air or free fluid. Musculoskeletal: No acute or significant osseous findings. IMPRESSION: 1. Ground-glass density in the right middle lobe, may reflect edema or pneumonia 2. No CT evidence for acute intra-abdominal or pelvic abnormality 3. Sigmoid colon diverticular disease without acute inflammatory change Electronically Signed   By: Donavan Foil M.D.   On: 12/01/2018 21:08     Echo LVEF 50 to 55%, stable appearing mitral valve bioprosthesis  TELEMETRY: Sinus rhythm at 90 bpm:  ASSESSMENT AND PLAN:  Active Problems:   CAP (community acquired pneumonia)    1.  Acute on chronic diastolic congestive heart failure, modestly improved 2.  Respiratory failure, multifactorial 3.  COPD exacerbation, on supplemental O2, with ongoing tobacco abuse 4.  Possible pneumonia, CT revealing groundglass density right middle lobe, with elevated white count 5.  Paroxysmal atrial fibrillation, on Xarelto for stroke prevention, currently in sinus rhythm 6. 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  Continue diuresis 2.  Carefully monitor renal status 3.  Continue Xarelto for stroke prevention 4.  Further cardiac diagnostics at this time   Isaias Cowman, MD, PhD, [facility] 12/03/2018 11:13 AM

## 2018-12-03 NOTE — Progress Notes (Addendum)
Cardiovascular and Pulmonary Nurse Navigator Note:   [name] is currently enrolled in the Cardiac Rehab program at [facility]  NOTE:  Patient was referred to [facility] by Dr. Candie Mile with dx of s/p mitral valve bioprosthetic valve 07/2018.   Recently, she has been unable to attend the program due to medical issues.  She will need medical clearance to return.    Thank you.  Roanna Epley, RN, BSN, [name] [facility]  Cardiovascular & Pulmonary Nurse Navigator  Direct Line: [phone]  Department Phone #: [phone] Fax: [phone]  Email Address: [email or web address]

## 2018-12-04 DIAGNOSIS — R1013 Epigastric pain: Secondary | ICD-10-CM | POA: Diagnosis present

## 2018-12-04 DIAGNOSIS — R109 Unspecified abdominal pain: Secondary | ICD-10-CM | POA: Diagnosis not present

## 2018-12-04 DIAGNOSIS — Z888 Allergy status to other drugs, medicaments and biological substances status: Secondary | ICD-10-CM | POA: Diagnosis not present

## 2018-12-04 DIAGNOSIS — R140 Abdominal distension (gaseous): Secondary | ICD-10-CM | POA: Diagnosis present

## 2018-12-04 DIAGNOSIS — G894 Chronic pain syndrome: Secondary | ICD-10-CM | POA: Diagnosis present

## 2018-12-04 DIAGNOSIS — I5033 Acute on chronic diastolic (congestive) heart failure: Secondary | ICD-10-CM | POA: Diagnosis present

## 2018-12-04 DIAGNOSIS — Z825 Family history of asthma and other chronic lower respiratory diseases: Secondary | ICD-10-CM | POA: Diagnosis not present

## 2018-12-04 DIAGNOSIS — F17210 Nicotine dependence, cigarettes, uncomplicated: Secondary | ICD-10-CM | POA: Diagnosis present

## 2018-12-04 DIAGNOSIS — Z765 Malingerer [conscious simulation]: Secondary | ICD-10-CM | POA: Diagnosis not present

## 2018-12-04 DIAGNOSIS — E876 Hypokalemia: Secondary | ICD-10-CM | POA: Diagnosis present

## 2018-12-04 DIAGNOSIS — I480 Paroxysmal atrial fibrillation: Secondary | ICD-10-CM | POA: Diagnosis present

## 2018-12-04 DIAGNOSIS — J189 Pneumonia, unspecified organism: Secondary | ICD-10-CM | POA: Diagnosis not present

## 2018-12-04 DIAGNOSIS — G629 Polyneuropathy, unspecified: Secondary | ICD-10-CM | POA: Diagnosis present

## 2018-12-04 DIAGNOSIS — J9621 Acute and chronic respiratory failure with hypoxia: Secondary | ICD-10-CM | POA: Diagnosis present

## 2018-12-04 DIAGNOSIS — Z886 Allergy status to analgesic agent status: Secondary | ICD-10-CM | POA: Diagnosis not present

## 2018-12-04 DIAGNOSIS — J441 Chronic obstructive pulmonary disease with (acute) exacerbation: Secondary | ICD-10-CM | POA: Diagnosis present

## 2018-12-04 DIAGNOSIS — I110 Hypertensive heart disease with heart failure: Secondary | ICD-10-CM | POA: Diagnosis present

## 2018-12-04 DIAGNOSIS — Z6841 Body Mass Index (BMI) 40.0 and over, adult: Secondary | ICD-10-CM | POA: Diagnosis not present

## 2018-12-04 DIAGNOSIS — Z8249 Family history of ischemic heart disease and other diseases of the circulatory system: Secondary | ICD-10-CM | POA: Diagnosis not present

## 2018-12-04 DIAGNOSIS — G4733 Obstructive sleep apnea (adult) (pediatric): Secondary | ICD-10-CM | POA: Diagnosis present

## 2018-12-04 DIAGNOSIS — Z885 Allergy status to narcotic agent status: Secondary | ICD-10-CM | POA: Diagnosis not present

## 2018-12-04 DIAGNOSIS — Z79899 Other long term (current) drug therapy: Secondary | ICD-10-CM | POA: Diagnosis not present

## 2018-12-04 DIAGNOSIS — J440 Chronic obstructive pulmonary disease with acute lower respiratory infection: Secondary | ICD-10-CM | POA: Diagnosis present

## 2018-12-04 DIAGNOSIS — I509 Heart failure, unspecified: Secondary | ICD-10-CM

## 2018-12-04 DIAGNOSIS — I081 Rheumatic disorders of both mitral and tricuspid valves: Secondary | ICD-10-CM | POA: Diagnosis present

## 2018-12-04 LAB — BASIC METABOLIC PANEL
Anion gap: 9 (ref 5–15)
BUN: 17 mg/dL (ref 6–20)
CO2: 25 mmol/L (ref 22–32)
Calcium: 9.2 mg/dL (ref 8.9–10.3)
Chloride: 102 mmol/L (ref 98–111)
Creatinine, Ser: 0.58 mg/dL (ref 0.44–1.00)
GFR calc non Af Amer: >60 mL/min (ref >60)
Glucose, Bld: 157 mg/dL — ABNORMAL HIGH (ref 70–99)
Potassium: 4.1 mmol/L (ref 3.5–5.1)
Sodium: 136 mmol/L (ref 135–145)

## 2018-12-04 MED ORDER — METHYLPREDNISOLONE SODIUM SUCC 40 MG IJ SOLR
40.0000 mg | Freq: Two times a day (BID) | INTRAMUSCULAR | Status: DC
  Administered 2018-12-04 – 2018-12-06 (×4): 40 mg via INTRAVENOUS
  Filled 2018-12-04 (×4): qty 1

## 2018-12-04 MED ORDER — SENNOSIDES-DOCUSATE SODIUM 8.6-50 MG PO TABS
1.0000 | ORAL_TABLET | Freq: Every day | ORAL | Status: DC
  Administered 2018-12-04 – 2018-12-05 (×3): 1 via ORAL
  Filled 2018-12-04 (×3): qty 1

## 2018-12-04 MED ORDER — FUROSEMIDE 10 MG/ML IJ SOLN: 40.0000 mg | Freq: Every day | INTRAMUSCULAR | Status: DC

## 2018-12-04 NOTE — Progress Notes (Deleted)
Patient's Name: Chelsea Ramsey  MRN: [id number]  Referring Provider: Kathee Delton, MD  DOB: Dec 11, 1965  PCP: Kathee Delton, MD  DOS: 12/05/2018  Note by: Gaspar Cola, MD  Service setting: Ambulatory outpatient  Specialty: Interventional Pain Management  Location: [facility] (AMB) Pain Management Facility    Patient type: Established   Primary Reason(s) for Visit: Encounter for prescription drug management. (Level of risk: moderate)  CC: No chief complaint on file.  HPI  Chelsea Ramsey is a 53 y.o. year old, female patient, who comes today for a medication management evaluation. She has Primary localized osteoarthritis of right knee; COPD (chronic obstructive pulmonary disease) with acute bronchitis ([place]); COPD (chronic obstructive pulmonary disease) ([place]); A-fib [facility]); GIB (gastrointestinal bleeding); Intractable vomiting with nausea; Melena; Nausea and vomiting; Acute esophagogastric ulcer; Acute CHF (congestive heart failure) ([place]); Chronic diastolic heart failure ([place]); HTN (hypertension); Chronic low back pain (Primary Area of Pain) (Bilateral) w/ sciatica (Left); Chronic lower extremity pain (Secondary Area of Pain) (Left); Sternal pain (Tertiary Area of Pain); Fibromyalgia (Fourth Area of Pain); Chronic knee pain (Fifth Area of Pain) (Right); Chronic sacroiliac joint pain; Chronic pain syndrome; Long term current use of opiate analgesic; Pharmacologic therapy; Disorder of skeletal system; Problems influencing health status; Chest pain; Class 3 severe obesity with body mass index (BMI) of 45.0 to 49.9 in adult [facility]); Chronic anticoagulation (Xarelto); Vitamin D deficiency; 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 Her primarily concern today is the No chief complaint on file.  Pain Assessment: Location:     Radiating:   Onset:   Duration:   Quality:   Severity:  /10 (subjective, self-reported pain score)   Note: Reported level is compatible with observation.                         When using our objective Pain Scale, levels between 6 and 10/10 are said to belong in an emergency room, as it progressively worsens from a 6/10, described as severely limiting, requiring emergency care not usually available at an outpatient pain management facility. At a 6/10 level, communication becomes difficult and requires great effort. Assistance to reach the emergency department may be required. Facial flushing and profuse sweating along with potentially dangerous increases in heart rate and blood pressure will be evident. Effect on ADL:   Timing:   Modifying factors:   BP:    HR:    Chelsea Ramsey was last scheduled for an appointment on 11/14/2018 for medication management. During today's appointment we reviewed Chelsea Ramsey's chronic pain status, as well as her outpatient medication regimen.  The patient  reports previous drug use. Her body mass index is unknown because there is no height or weight on file.  Further details on both, my assessment(s), as well as the proposed treatment plan, please see below.  Controlled Substance Pharmacotherapy Assessment REMS (Risk Evaluation and Mitigation Strategy)  Analgesic: Oxycodone IR 10 mg (1/2 to 1)(5-10 mg) tab PO BID (10-20 mg/day of oxycodone) MME/day: 15-30 mg/day.  No notes on file Pharmacokinetics: Liberation and absorption (onset of action): WNL Distribution (time to peak effect): WNL Metabolism and excretion (duration of action): WNL         Pharmacodynamics: Desired effects: Analgesia: Chelsea Ramsey reports >50% benefit. Functional ability: Patient reports that medication allows her to accomplish basic ADLs Clinically meaningful improvement in function (CMIF): Sustained CMIF goals met Perceived effectiveness: Described as relatively effective,  allowing for increase in activities of daily living (ADL) Undesirable effects: Side-effects or Adverse  reactions: None reported Monitoring: [place] PMP: Online review of the past 52-monthperiod conducted. Compliant with practice rules and regulations Last UDS on record: Summary  Date Value Ref Range Status  10/10/2018 FINAL  Final    Comment:    ==================================================================== TOXASSURE COMP DRUG ANALYSIS,UR ==================================================================== Test                             Result       Flag       Units Drug Present and Declared for Prescription Verification   Hydrocodone                    2789         EXPECTED   ng/mg creat   Hydromorphone                  617          EXPECTED   ng/mg creat   Dihydrocodeine                 379          EXPECTED   ng/mg creat   Norhydrocodone                 1250         EXPECTED   ng/mg creat    Sources of hydrocodone include scheduled prescription    medications. Hydromorphone, dihydrocodeine and norhydrocodone are    expected metabolites of hydrocodone. Hydromorphone and    dihydrocodeine are also available as scheduled prescription    medications.   Gabapentin                     PRESENT      EXPECTED   Acetaminophen                  PRESENT      EXPECTED   Metoprolol                     PRESENT      EXPECTED Drug Present not Declared for Prescription Verification   Salicylate                     PRESENT      UNEXPECTED Drug Absent but Declared for Prescription Verification   Topiramate                     Not Detected UNEXPECTED   Tizanidine                     Not Detected UNEXPECTED    Tizanidine, as indicated in the declared medication list, is not    always detected even when used as directed.   Diltiazem                      Not Detected UNEXPECTED ==================================================================== Test                      Result    Flag   Units      Ref Range   Creatinine              201              mg/dL       >=  20 ==================================================================== Declared Medications:  The flagging and interpretation on this report are based on the  following declared medications.  Unexpected results may arise from  inaccuracies in the declared medications.  **Note: The testing scope of this panel includes these medications:  Diltiazem  Gabapentin (Neurontin)  Hydrocodone (Norco)  Metoprolol  Topiramate  **Note: The testing scope of this panel does not include small to  moderate amounts of these reported medications:  Acetaminophen (Norco)  Tizanidine  **Note: The testing scope of this panel does not include following  reported medications:  Albuterol  Albuterol (Duoneb)  Budesonide (Symbicort)  Bumetanide (Bumex)  Docusate (Dulcolax)  Formoterol (Symbicort)  Ipratropium (Atrovent)  Ipratropium (Duoneb)  Iron (Ferrous Sulfate)  Lactulose  Magnesium Oxide  Omeprazole  Rivaroxaban ==================================================================== For clinical consultation, please call [phone]. ====================================================================    UDS interpretation: Compliant          Medication Assessment Form: Reviewed. Patient indicates being compliant with therapy Treatment compliance: Compliant Risk Assessment Profile: Aberrant behavior: See prior evaluations. None observed or detected today Comorbid factors increasing risk of overdose: See prior notes. No additional risks detected today Opioid risk tool (ORT) (Total Score):   Personal History of Substance Abuse (SUD-Substance use disorder):  Alcohol:    Illegal Drugs:    Rx Drugs:    ORT Risk Level calculation:   Risk of substance use disorder (SUD): Low  ORT Scoring interpretation table:  Score <3 = Low Risk for SUD  Score between 4-7 = Moderate Risk for SUD  Score >8 = High Risk for Opioid Abuse   Risk Mitigation Strategies:  Patient Counseling:  Covered Patient-Prescriber Agreement (PPA): Present and active  Notification to other healthcare providers: Done  Pharmacologic Plan: No change in therapy, at this time.             Laboratory Chemistry  Inflammation Markers (CRP: Acute Phase) (ESR: Chronic Phase) Lab Results  Component Value Date   CRP 11 (H) 10/10/2018   ESRSEDRATE 79 (H) 10/10/2018                         Rheumatology Markers Lab Results  Component Value Date   LABURIC 10.6 (H) 02/24/2018                        Renal Function Markers Lab Results  Component Value Date   BUN 15 12/03/2018   CREATININE 0.55 12/03/2018   BCR 18 10/10/2018   GFRAA >60 12/03/2018   GFRNONAA >60 12/03/2018                             Hepatic Function Markers Lab Results  Component Value Date   AST 18 12/01/2018   ALT 20 12/01/2018   ALBUMIN 3.7 12/01/2018   ALKPHOS 116 12/01/2018   LIPASE 37 12/01/2018                        Electrolytes Lab Results  Component Value Date   NA 138 12/03/2018   K 3.8 12/03/2018   CL 101 12/03/2018   CALCIUM 8.7 (L) 12/03/2018   MG 2.1 12/03/2018   PHOS 4.1 12/03/2018                        Neuropathy Markers Lab Results  Component Value Date   VITAMINB12 615 10/10/2018  HGBA1C 5.2 08/21/2014   HIV Non Reactive 12/02/2018                        CNS Tests No results found.  Bone Pathology Markers Lab Results  Component Value Date   25OHVITD1 9.6 (L) 10/10/2018   25OHVITD2 <1.0 10/10/2018   25OHVITD3 9.4 10/10/2018                         Coagulation Parameters Lab Results  Component Value Date   INR 1.24 11/17/2018   LABPROT 15.5 (H) 11/17/2018   APTT 26 05/22/2018   PLT 204 12/03/2018                        Cardiovascular Markers Lab Results  Component Value Date   BNP 259.0 (H) 12/01/2018   CKTOTAL 39 12/17/2013   CKMB 0.9 03/28/2013   TROPONINI <0.03 12/02/2018   HGB 10.8 (L) 12/03/2018   HCT 36.4 12/03/2018                         CA Markers No  results found.  Note: Lab results reviewed.  Recent Diagnostic Imaging Review  Shoulder Imaging: Shoulder-R DG:  Results for orders placed during the hospital encounter of 06/08/15  DG Shoulder Right   Narrative CLINICAL DATA:  Right shoulder pain x6 days  EXAM: RIGHT SHOULDER - 2+ VIEW  COMPARISON:  None.  FINDINGS: No fracture or dislocation is seen.  Mild degenerative changes at the glenohumeral and acromioclavicular joints.  Visualized right lung is grossly clear.  IMPRESSION: No fracture or dislocation is seen.  Mild degenerative changes.   Electronically Signed   By: Julian Hy M.D.   On: 06/08/2015 12:15    Lumbosacral Imaging: Lumbar MR wo contrast:  Results for orders placed during the hospital encounter of 01/05/18  MR LUMBAR SPINE WO CONTRAST   Narrative CLINICAL DATA:  Lumbar radiculitis. Low back pain for 6 months that is worsening.  EXAM: MRI LUMBAR SPINE WITHOUT CONTRAST  TECHNIQUE: Multiplanar, multisequence MR imaging of the lumbar spine was performed. No intravenous contrast was administered.  COMPARISON:  06/05/2017  FINDINGS: Segmentation:  Standard.  Alignment:  Physiologic.  Vertebrae: No fracture, evidence of discitis, or aggressive bone lesion. L5 hemangioma.  Conus medullaris and cauda equina: Conus extends to the L1 level. Conus and cauda equina appear normal.  Paraspinal and other soft tissues: Negative  Disc levels:  T12- L1: Unremarkable.  L1-L2: Mild spondylosis.  No impingement  L2-L3: Unremarkable.  L3-L4: Stable mild disc bulging most notable at the foramina. Mild epidural fat expansion. No impingement  L4-L5: Stable disc bulging and left foraminal annular fissure. Thecal sac partial effacement from epidural fat expansion that is progressed. No degenerative impingement.  L5-S1:No significant degenerative changes. Conjoined root sleeve on the left.  IMPRESSION: 1. Mild disc degeneration without  progression since July 2018 comparison. No degenerative impingement. 2. Epidural fat expansion that has progressed from 2018, with moderate thecal sac effacement at L4-5.   Electronically Signed   By: Monte Fantasia M.D.   On: 01/05/2018 14:31    Sacroiliac Joint Imaging: Sacroiliac Joint DG:  Results for orders placed during the hospital encounter of 10/17/18  DG Si Joints   Narrative CLINICAL DATA:  Chronic sacroiliac pain.  EXAM: BILATERAL SACROILIAC JOINTS - 3+ VIEW  COMPARISON:  Abdominal radiograph July 20, 2018  FINDINGS:  The sacroiliac joint spaces are maintained, mild symmetric osteoarthrosis with undersurface spurring. No other bone abnormalities are seen.  IMPRESSION: Mild bilateral sacroiliac osteoarthrosis.   Electronically Signed   By: Elon Alas M.D.   On: 10/17/2018 14:33    Knee Imaging: Knee-R DG 1-2 views:  Results for orders placed during the hospital encounter of 10/17/18  DG Knee 1-2 Views Right   Narrative CLINICAL DATA:  Chronic right knee arthralgia.  EXAM: RIGHT KNEE - 1-2 VIEW  COMPARISON:  Right knee radiographs 07/05/2015.  FINDINGS: Patient is status post right total knee arthroplasty. There is no significant joint effusion. Joint is located. Opponents are well seated.  IMPRESSION: 1. Status post right total knee arthroplasty. 2. No acute abnormality.   Electronically Signed   By: [name] Morelle M.D.   On: 10/17/2018 14:37    Knee-L DG 1-2 views:  Results for orders placed during the hospital encounter of 07/05/15  DG Knee 2 Views Left   Narrative CLINICAL DATA:  Left knee pain and swelling for 4 days. No known injury.  EXAM: LEFT KNEE - 1-2 VIEW  COMPARISON:  11/02/2014.  FINDINGS: No fracture or bone lesion. There is minor marginal spurring from the medial compartment. No other arthropathic change. No convincing joint effusion. Soft tissues are unremarkable.  IMPRESSION: 1. No fracture or  acute finding. 2. Minor medial joint space compartment osteoarthritis.   Electronically Signed   By: Lajean Manes M.D.   On: 07/05/2015 21:18    Knee-R DG 4 views:  Results for orders placed in visit on 06/08/00  DG Knee Complete 4 Views Right   Narrative FINDINGS INDICATION:  FELL ON KNEE THIS A.M. W/PAIN ALONG MEDIAL MALLEOLUS. 4V RIGHT KNEE: NO SIGNIFICANT JOINT EFFUSION IS IDENTIFIED.  THERE ARE NO OBVIOUS FRACTURES OR DISLOCATIONS.  WELL- CORTICATED OSSIFICATION VERSUS CALCIFICATION IS SEEN ALONG THE MEDIAL MALLEOLUS.  THERE IS ALSO MILD SPURRING DEMONSTRATED AT THE TIBIAL SPINES, SUGGESTING MILD DEGENERATIVE CHANGE. IMPRESSION MILD DEGENERATIVE CHANGE.  NO OBVIOUS FRACTURES OR DISLOCATIONS.   Ankle Imaging: Ankle-R DG Complete:  Results for orders placed during the hospital encounter of 09/18/16  DG Ankle Complete Right   Narrative CLINICAL DATA:  Fall last night with lateral right ankle pain.  EXAM: RIGHT ANKLE - COMPLETE 3+ VIEW  COMPARISON:  None.  FINDINGS: Examination demonstrates no evidence of acute fracture or dislocation. Ankle mortise is within normal. Small inferior calcaneal spur is present.  IMPRESSION: No acute findings.   Electronically Signed   By: Marin Olp M.D.   On: 09/18/2016 15:16    Foot Imaging: Foot-L DG Complete:  Results for orders placed during the hospital encounter of 02/24/18  DG Foot Complete Left   Narrative CLINICAL DATA:  Acute LEFT foot pain for 2 days. No known injury. Initial encounter.  EXAM: LEFT FOOT - COMPLETE 3+ VIEW  COMPARISON:  None.  FINDINGS: Articular surface irregularity of the second metatarsal head is noted.  A small to moderate calcaneal spur is present.  There is no evidence of acute fracture, subluxation or dislocation.  No suspicious focal bony lesions are present.  IMPRESSION: Articular surface irregularity of the second metatarsal head which could be secondary to degenerative  change or prior injury.  Small to moderate calcaneal spur.  No evidence of acute abnormality   Electronically Signed   By: Margarette Canada M.D.   On: 02/24/2018 13:40    Complexity Note: Imaging results reviewed. Results shared with Chelsea Ramsey, using Layman's terms.  Meds  No current facility-administered medications for this visit.  No current outpatient medications on file.  Facility-Administered Medications Ordered in Other Visits:  .  0.9 %  sodium chloride infusion, 250 mL, Intravenous, PRN, Salary, Montell D, MD, Last Rate: 10 mL/hr at 12/02/18 0349, 250 mL at 12/02/18 0349 .  acetaminophen (TYLENOL) tablet 650 mg, 650 mg, Oral, Q4H PRN, Salary, Montell D, MD, 650 mg at 12/02/18 1856 .  azithromycin (ZITHROMAX) tablet 500 mg, 500 mg, Oral, Q24H, Salary, Montell D, MD, 500 mg at 12/02/18 2353 .  benzonatate (TESSALON) capsule 100 mg, 100 mg, Oral, TID PRN, Henreitta Leber, MD .  bisacodyl (DULCOLAX) EC tablet 10 mg, 10 mg, Oral, Daily PRN, Salary, Montell D, MD, 10 mg at 12/03/18 2053 .  budesonide (PULMICORT) nebulizer solution 0.5 mg, 0.5 mg, Nebulization, BID, Sainani, Belia Heman, MD, 0.5 mg at 12/03/18 1958 .  cefTRIAXone (ROCEPHIN) 1 g in sodium chloride 0.9 % 100 mL IVPB, 1 g, Intravenous, Q24H, Salary, Montell D, MD, Last Rate: 200 mL/hr at 12/02/18 2354, 1 g at 12/02/18 2354 .  furosemide (LASIX) injection 40 mg, 40 mg, Intravenous, Q12H, Paticia Stack, RPH, 40 mg at 12/03/18 2041 .  gabapentin (NEURONTIN) tablet 300 mg, 300 mg, Oral, BID, Salary, Montell D, MD, 300 mg at 12/03/18 2040 .  guaiFENesin (MUCINEX) 12 hr tablet 600 mg, 600 mg, Oral, BID, Salary, Montell D, MD, 600 mg at 12/03/18 2041 .  ipratropium-albuterol (DUONEB) 0.5-2.5 (3) MG/3ML nebulizer solution 3 mL, 3 mL, Inhalation, Q6H, Sainani, Belia Heman, MD, 3 mL at 12/03/18 1958 .  lactulose (CHRONULAC) 10 GM/15ML solution 30 g, 30 g, Oral, BID PRN, Salary, Montell D, MD .  magnesium oxide  (MAG-OX) tablet 400 mg, 400 mg, Oral, BID, Salary, Montell D, MD, 400 mg at 12/03/18 2041 .  meclizine (ANTIVERT) tablet 25 mg, 25 mg, Oral, TID PRN, Salary, Montell D, MD, 25 mg at 12/03/18 1755 .  menthol-cetylpyridinium (CEPACOL) lozenge 3 mg, 1 lozenge, Oral, PRN, Sainani, Vivek J, MD .  methylPREDNISolone sodium succinate (SOLU-MEDROL) 40 mg/mL injection 40 mg, 40 mg, Intravenous, Q8H, Sainani, Belia Heman, MD, 40 mg at 12/03/18 2041 .  metoprolol tartrate (LOPRESSOR) tablet 100 mg, 100 mg, Oral, BID, Salary, Montell D, MD, 100 mg at 12/03/18 2040 .  nicotine (NICODERM CQ - dosed in mg/24 hours) patch 14 mg, 14 mg, Transdermal, Daily, Salary, Montell D, MD, 14 mg at 12/03/18 1636 .  nitroGLYCERIN (NITROSTAT) SL tablet 0.4 mg, 0.4 mg, Sublingual, Q5 min PRN, Salary, Montell D, MD .  oxyCODONE (Oxy IR/ROXICODONE) immediate release tablet 5-10 mg, 5-10 mg, Oral, Q4H PRN, Arta Silence, MD, 10 mg at 12/03/18 2308 .  pantoprazole (PROTONIX) EC tablet 40 mg, 40 mg, Oral, Daily, Salary, Montell D, MD, 40 mg at 12/03/18 0925 .  potassium chloride SA (K-DUR,KLOR-CON) CR tablet 20 mEq, 20 mEq, Oral, Daily, Salary, Montell D, MD, 20 mEq at 12/03/18 0925 .  promethazine (PHENERGAN) injection 12.5 mg, 12.5 mg, Intravenous, Q6H PRN, Jodell Cipro, Prasanna, MD, 12.5 mg at 12/03/18 1940 .  promethazine (PHENERGAN) tablet 12.5 mg, 12.5 mg, Oral, Q6H PRN, Salary, Montell D, MD, 12.5 mg at 12/02/18 0932 .  rivaroxaban (XARELTO) tablet 20 mg, 20 mg, Oral, QPM, Salary, Montell D, MD, 20 mg at 12/03/18 1756 .  sodium chloride flush (NS) 0.9 % injection 3 mL, 3 mL, Intravenous, Q12H, Salary, Montell D, MD, 3 mL at 12/03/18 2042 .  sodium chloride flush (NS) 0.9 % injection 3 mL,  3 mL, Intravenous, PRN, Salary, Montell D, MD .  tiZANidine (ZANAFLEX) tablet 4 mg, 4 mg, Oral, QHS, Salary, Montell D, MD, 4 mg at 12/03/18 2308 .  topiramate (TOPAMAX) tablet 25 mg, 25 mg, Oral, BID, Salary, Montell D, MD, 25 mg at 12/03/18  2045  ROS  Constitutional: Denies any fever or chills Gastrointestinal: No reported hemesis, hematochezia, vomiting, or acute GI distress Musculoskeletal: Denies any acute onset joint swelling, redness, loss of ROM, or weakness Neurological: No reported episodes of acute onset apraxia, aphasia, dysarthria, agnosia, amnesia, paralysis, loss of coordination, or loss of consciousness  Allergies  Chelsea Ramsey is allergic to amiodarone; aspirin; flexeril [cyclobenzaprine]; trazamine [trazodone & diet manage prod]; codeine; and tramadol.  [place]  Drug: Chelsea Ramsey  reports previous drug use. Alcohol:  reports previous alcohol use of about 3.0 standard drinks of alcohol per week. Tobacco:  reports that she has been smoking cigarettes. She has been smoking about 0.50 packs per day. She has never used smokeless tobacco. Medical:  has a past medical history of Allergy, Anxiety, Arthritis, Asthma, CHF (congestive heart failure) ([place]), COPD (chronic obstructive pulmonary disease) ([place]), Coronary artery disease, Fibromyalgia, GERD (gastroesophageal reflux disease), Hypertension, Pneumonia, PUD (peptic ulcer disease), Pulmonary HTN ([place]), Rheumatic fever/heart disease, and Sleep apnea. Surgical: Chelsea Ramsey  has a past surgical history that includes Tonsillectomy; Adenoidectomy; Multiple tooth extractions; Total knee arthroplasty (Right, 09/04/2016); Esophagogastroduodenoscopy (egd) with propofol (N/A, 05/25/2018); and Mitral valve replacement. Family: family history includes Asthma in her mother; COPD in her mother; Cancer in her mother; Congestive Heart Failure in her father.  Constitutional Exam  General appearance: Well nourished, well developed, and well hydrated. In no apparent acute distress There were no vitals filed for this visit. BMI Assessment: Estimated body mass index is 46.38 kg/m as calculated from the following:   Height as of 12/02/18: _0  (1.626 m).   Weight as of 12/03/18: 270 lb 3.2 oz  (122.6 kg).  BMI interpretation table: BMI level Category Range association with higher incidence of chronic pain  <18 kg/m2 Underweight   18.5-24.9 kg/m2 Ideal body weight   25-29.9 kg/m2 Overweight Increased incidence by 20%  30-34.9 kg/m2 Obese (Class I) Increased incidence by 68%  35-39.9 kg/m2 Severe obesity (Class II) Increased incidence by 136%  >40 kg/m2 Extreme obesity (Class III) Increased incidence by 254%   Patient's current BMI Ideal Body weight  There is no height or weight on file to calculate BMI. Ideal body weight: 54.7 kg (120 lb 9.5 oz) Adjusted ideal body weight: 81.8 kg (180 lb 7 oz)   BMI Readings from Last 4 Encounters:  12/03/18 46.38 kg/m  11/19/18 45.30 kg/m  11/17/18 45.85 kg/m  11/16/18 46.07 kg/m   Wt Readings from Last 4 Encounters:  12/03/18 270 lb 3.2 oz (122.6 kg)  11/19/18 263 lb 14.4 oz (119.7 kg)  11/17/18 267 lb 2 oz (121.2 kg)  11/16/18 268 lb 6 oz (121.7 kg)  Psych/Mental status: Alert, oriented x 3 (person, place, & time)       Eyes: PERLA Respiratory: No evidence of acute respiratory distress  Cervical Spine Area Exam  Skin & Axial Inspection: No masses, redness, edema, swelling, or associated skin lesions Alignment: Symmetrical Functional ROM: Unrestricted ROM      Stability: No instability detected Muscle Tone/Strength: Functionally intact. No obvious neuro-muscular anomalies detected. Sensory (Neurological): Unimpaired Palpation: No palpable anomalies              Upper Extremity (UE) Exam  Side: Right upper extremity  Side: Left upper extremity  Skin & Extremity Inspection: Skin color, temperature, and hair growth are WNL. No peripheral edema or cyanosis. No masses, redness, swelling, asymmetry, or associated skin lesions. No contractures.  Skin & Extremity Inspection: Skin color, temperature, and hair growth are WNL. No peripheral edema or cyanosis. No masses, redness, swelling, asymmetry, or associated skin lesions. No  contractures.  Functional ROM: Unrestricted ROM          Functional ROM: Unrestricted ROM          Muscle Tone/Strength: Functionally intact. No obvious neuro-muscular anomalies detected.  Muscle Tone/Strength: Functionally intact. No obvious neuro-muscular anomalies detected.  Sensory (Neurological): Unimpaired          Sensory (Neurological): Unimpaired          Palpation: No palpable anomalies              Palpation: No palpable anomalies              Provocative Test(s):  Phalen's test: deferred Tinel's test: deferred Apley's scratch test (touch opposite shoulder):  Action 1 (Across chest): deferred Action 2 (Overhead): deferred Action 3 (LB reach): deferred   Provocative Test(s):  Phalen's test: deferred Tinel's test: deferred Apley's scratch test (touch opposite shoulder):  Action 1 (Across chest): deferred Action 2 (Overhead): deferred Action 3 (LB reach): deferred    Thoracic Spine Area Exam  Skin & Axial Inspection: No masses, redness, or swelling Alignment: Symmetrical Functional ROM: Unrestricted ROM Stability: No instability detected Muscle Tone/Strength: Functionally intact. No obvious neuro-muscular anomalies detected. Sensory (Neurological): Unimpaired Muscle strength & Tone: No palpable anomalies  Lumbar Spine Area Exam  Skin & Axial Inspection: No masses, redness, or swelling Alignment: Symmetrical Functional ROM: Unrestricted ROM       Stability: No instability detected Muscle Tone/Strength: Functionally intact. No obvious neuro-muscular anomalies detected. Sensory (Neurological): Unimpaired Palpation: No palpable anomalies       Provocative Tests: Hyperextension/rotation test: deferred today       Lumbar quadrant test (Kemp's test): deferred today       Lateral bending test: deferred today       Patrick's Maneuver: deferred today                   FABER* test: deferred today                   S-I anterior distraction/compression test: deferred today          S-I lateral compression test: deferred today         S-I Thigh-thrust test: deferred today         S-I Gaenslen's test: deferred today         *(Flexion, ABduction and External Rotation)  Gait & Posture Assessment  Ambulation: Unassisted Gait: Relatively normal for age and body habitus Posture: WNL   Lower Extremity Exam    Side: Right lower extremity  Side: Left lower extremity  Stability: No instability observed          Stability: No instability observed          Skin & Extremity Inspection: Skin color, temperature, and hair growth are WNL. No peripheral edema or cyanosis. No masses, redness, swelling, asymmetry, or associated skin lesions. No contractures.  Skin & Extremity Inspection: Skin color, temperature, and hair growth are WNL. No peripheral edema or cyanosis. No masses, redness, swelling, asymmetry, or associated skin lesions. No contractures.  Functional ROM: Unrestricted ROM  Functional ROM: Unrestricted ROM                  Muscle Tone/Strength: Functionally intact. No obvious neuro-muscular anomalies detected.  Muscle Tone/Strength: Functionally intact. No obvious neuro-muscular anomalies detected.  Sensory (Neurological): Unimpaired        Sensory (Neurological): Unimpaired        DTR: Patellar: deferred today Achilles: deferred today Plantar: deferred today  DTR: Patellar: deferred today Achilles: deferred today Plantar: deferred today  Palpation: No palpable anomalies  Palpation: No palpable anomalies   Assessment  Primary Diagnosis & Pertinent Problem List: There were no encounter diagnoses.  Status Diagnosis  Controlled Controlled Controlled No diagnosis found.  Problems updated and reviewed during this visit: No problems updated. Plan of Care  Pharmacotherapy (Medications Ordered): No orders of the defined types were placed in this encounter.  Medications administered today: Chelsea Ramsey had no medications administered during  this visit.  Procedure Orders    No procedure(s) ordered today   Lab Orders  No laboratory test(s) ordered today   Imaging Orders  No imaging studies ordered today   Referral Orders  No referral(s) requested today   Interventional management options: Planned, scheduled, and/or pending:   NOTE: XARELTO Anticoagulation. Stop x 3 days prior to procedures and re-start 6 hours after. We need to have clearance to stop the patient's Xarelto before proceeding with any interventional therapies. ***   Considering:   Diagnostic bilateral lumbar facet nerve blocks  Diagnostic bilateral lumbar facet radiofrequency ablation  Trigger point injections  Diagnostic right knee genicular nerve block  Possible right knee genicular nerve radiofrequency ablation    Palliative PRN treatment(s):   None at this time   Provider-requested follow-up: No follow-ups on file.  Future Appointments  Date Time Provider [place]  12/05/2018 10:45 AM Milinda Pointer, MD [facility] None  12/08/2018  3:00 PM Alisa Graff, [place] [facility] None   Primary Care Physician: Kathee Delton, MD Location: [facility] Note by: Gaspar Cola, MD Date: 12/05/2018; Time: 12:23 AM

## 2018-12-04 NOTE — Progress Notes (Signed)
[facility] Cardiology  SUBJECTIVE: Patient sitting on side of bed, reports feeling better   Vitals:   12/03/18 1925 12/03/18 1959 12/04/18 0335 12/04/18 0800  BP: 111/69  109/67   Pulse: 92  90   Resp: (!) 22  18   Temp: 98 F (36.7 C)  (!) 97.5 F (36.4 C)   TempSrc: Oral  Oral   SpO2: 100% 96% 98%   Weight:   124.2 kg 123.5 kg  Height:    5\' 4"  (1.626 m)     Intake/Output Summary (Last 24 hours) at 12/04/2018 1012 Last data filed at 12/04/2018 0700 Gross per 24 hour  Intake 600 ml  Output 900 ml  Net -300 ml      PHYSICAL EXAM  General: Well developed, well nourished, in no acute distress HEENT:  Normocephalic and atramatic Neck:  No JVD.  Lungs: Clear bilaterally to auscultation and percussion. Heart: HRRR . Normal S1 and S2 without gallops or murmurs.  Abdomen: Bowel sounds are positive, abdomen soft and non-tender  Msk:  Back normal, normal gait. Normal strength and tone for age. Extremities: No clubbing, cyanosis or edema.   Neuro: Alert and oriented X 3. Psych:  Good affect, responds appropriately   LABS: Basic Metabolic Panel: Recent Labs    12/03/18 0427 12/04/18 0751  NA 138 136  K 3.8 4.1  CL 101 102  CO2 30 25  GLUCOSE 106* 157*  BUN 15 17  CREATININE 0.55 0.58  CALCIUM 8.7* 9.2  MG 2.1  --   PHOS 4.1  --    Liver Function Tests: Recent Labs    12/01/18 1835  AST 18  ALT 20  ALKPHOS 116  BILITOT 0.1*  PROT 6.9  ALBUMIN 3.7   Recent Labs    12/01/18 1835  LIPASE 37   CBC: Recent Labs    12/02/18 0433 12/03/18 0427  WBC 11.2* 9.8  HGB 10.4* 10.8*  HCT 34.7* 36.4  MCV 83.8 86.5  PLT 209 204   Cardiac Enzymes: Recent Labs    12/01/18 1602 12/02/18 0433 12/02/18 1541  TROPONINI <0.03 <0.03 <0.03   BNP: Invalid input(s): POCBNP D-Dimer: No results for input(s): DDIMER in the last 72 hours. Hemoglobin A1C: No results for input(s): HGBA1C in the last 72 hours. Fasting Lipid Panel: No results for input(s): CHOL, HDL,  LDLCALC, TRIG, CHOLHDL, LDLDIRECT in the last 72 hours. Thyroid Function Tests: No results for input(s): TSH, T4TOTAL, T3FREE, THYROIDAB in the last 72 hours.  Invalid input(s): FREET3 Anemia Panel: No results for input(s): VITAMINB12, FOLATE, FERRITIN, TIBC, IRON, RETICCTPCT in the last 72 hours.  No results found.   Echo LVEF 50 to 55% with stable appearing mitral valve bioprosthesis  TELEMETRY: Sinus rhythm at 90 bpm:  ASSESSMENT AND PLAN:  Active Problems:   CAP (community acquired pneumonia)    1.  Acute on chronic diastolic congestive heart failure, improved 2.  COPD exacerbation 3.  Paroxysmal atrial fibrillation, on Xarelto for stroke prevention 4.  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  Agree with current therapy 2.  Continue diuresis 3.  Carefully monitor renal status 4.  Continue Xarelto for stroke prevention 5.  Defer further cardiac diagnostics at this time  Sign off for now, please call if any questions   Isaias Cowman, MD, PhD, [facility] 12/04/2018 10:12 AM

## 2018-12-04 NOTE — Progress Notes (Signed)
[place] NAME: Chelsea Ramsey    MR#:  [id number]  DATE OF BIRTH:  November 12, 1966  SUBJECTIVE:   Patient admitted to the hospital secondary to shortness of breath, lower extremity edema, cough and noted to have acute respiratory failure with hypoxia secondary to COPD exacerbation and mild CHF.    Patient still complaining of some lower extremity edema and feels like she has some abdominal tightness/distention.  REVIEW OF SYSTEMS:    Review of Systems  Constitutional: Negative for chills and fever.  HENT: Negative for congestion and tinnitus.   Eyes: Negative for blurred vision and double vision.  Respiratory: Positive for cough and shortness of breath. Negative for wheezing.   Cardiovascular: Positive for leg swelling. Negative for chest pain, orthopnea and PND.  Gastrointestinal: Negative for abdominal pain, diarrhea, nausea and vomiting.  Genitourinary: Negative for dysuria and hematuria.  Neurological: Negative for dizziness, sensory change and focal weakness.  All other systems reviewed and are negative.   Nutrition: Heart healthy Tolerating Diet: Yes Tolerating PT: Await Eval.   DRUG ALLERGIES:   Allergies  Allergen Reactions  . Amiodarone Nausea And Vomiting  . Aspirin Swelling  . Flexeril [Cyclobenzaprine] Swelling  . Trazamine [Trazodone & Diet Manage Prod] Nausea And Vomiting  . Codeine Rash  . Tramadol Rash    VITALS:  Blood pressure 109/67, pulse 90, temperature (!) 97.5 F (36.4 C), temperature source Oral, resp. rate 18, height 5\' 4"  (1.626 m), weight 123.5 kg, last menstrual period 11/08/2009, SpO2 98 %.  PHYSICAL EXAMINATION:   Physical Exam  GENERAL:  53 y.o.-year-old obese patient lying in bed in no acute distress.  EYES: Pupils equal, round, reactive to light and accommodation. No scleral icterus. Extraocular muscles intact.  HEENT: Head atraumatic, normocephalic. Oropharynx and nasopharynx  clear.  NECK:  Supple, no jugular venous distention. No thyroid enlargement, no tenderness.  LUNGS: Prolonged inspiratory and expiratory phase, end expiratory wheezing bilaterally, No Rales, no rhonchi, negative use of accessory muscles. CARDIOVASCULAR: S1, S2 normal. No murmurs, rubs, or gallops.  ABDOMEN: Soft, nontender, nondistended. Bowel sounds present. No organomegaly or mass.  EXTREMITIES: No cyanosis, clubbing, + 1 edema b/l NEUROLOGIC: Cranial nerves II through XII are intact. No focal Motor or sensory deficits b/l.   PSYCHIATRIC: The patient is alert and oriented x 3.  SKIN: No obvious rash, lesion, or ulcer.    LABORATORY PANEL:   CBC Recent Labs  Lab 12/03/18 0427  WBC 9.8  HGB 10.8*  HCT 36.4  PLT 204   ------------------------------------------------------------------------------------------------------------------  Chemistries  Recent Labs  Lab 12/01/18 1835  12/03/18 0427 12/04/18 0751  NA  --    < > 138 136  K  --    < > 3.8 4.1  CL  --    < > 101 102  CO2  --    < > 30 25  GLUCOSE  --    < > 106* 157*  BUN  --    < > 15 17  CREATININE  --    < > 0.55 0.58  CALCIUM  --    < > 8.7* 9.2  MG  --   --  2.1  --   AST 18  --   --   --   ALT 20  --   --   --   ALKPHOS 116  --   --   --   BILITOT 0.1*  --   --   --    < > =  values in this interval not displayed.   ------------------------------------------------------------------------------------------------------------------  Cardiac Enzymes Recent Labs  Lab 12/02/18 1541  TROPONINI <0.03   ------------------------------------------------------------------------------------------------------------------  RADIOLOGY:  No results found.   ASSESSMENT AND PLAN:   53 year old female with past medical history of obesity, obstructive sleep apnea, ongoing tobacco abuse, COPD, chronic diastolic CHF, neuropathy, history of migraines, chronic pain who presented to the hospital due to shortness of  breath.  1.  Acute on chronic respiratory failure with hypoxia-secondary to CHF/COPD. - Continue diuresis with IV Lasix for underlying CHF. - cont. IV steroids, scheduled duo nebs and Pulmicort nebs.  2.  CHF-acute on chronic diastolic dysfunction.  Patient was on oral torsemide but apparently was not responding to it as per her.  Does not appear to be volume overloaded much.  - cont. Lasix but will taper, follow I's and O's and daily weights.  Continue metoprolol. - appreciate Cardiology input.   3.  COPD- mild acute exacerbation secondary to ongoing tobacco abuse and also suspected pneumonia. -  Improving and will taper IV steroids, cont. scheduled duo nebs,  Pulmicort nebs. - Continue IV ceftriaxone, Zithromax.   4.  Tobacco abuse-continue nicotine patch.  5.  Essential hypertension-continue Lopressor.  6.  Paroxysmal atrial fibrillation-rate controlled.  Continue metoprolol.  Continue Xarelto.  7.  Chronic pain-continue patient's oxycodone. -Patient has high drug-seeking behavior and would not advance any pain meds.   All the records are reviewed and case discussed with Care Management/Social Worker. Management plans discussed with the patient, family and they are in agreement.  CODE STATUS: Full code  DVT Prophylaxis: Xarelto  TOTAL TIME TAKING CARE OF THIS PATIENT: 25 minutes.   POSSIBLE D/C IN 1-2 DAYS, DEPENDING ON CLINICAL CONDITION.   Henreitta Leber M.D on 12/04/2018 at 1:47 PM  Between 7am to 6pm - Pager - [phone]  After 6pm go to [email or web address] - Proofreader  [facility] [place] [facility]  Office  [phone]  CC: Primary care physician; Kathee Delton, MD

## 2018-12-04 NOTE — Plan of Care (Addendum)
Nutrition Education Note  RD consulted for nutrition education regarding CHF.  Met with patient at bedside. She reports she has a good understanding of low sodium diet. She goes to cardiac rehab and sees an outpatient RD there. She limits intake of processed foods/meats, chooses low-salt canned vegetables and rinses them before cooking, and does not use salt while cooking or at the table. She is not currently on a fluid restriction. Also weighs herself daily at home.  RD provided "Low Sodium Nutrition Therapy" handout from the Academy of [employer] and Dietetics. Reviewed patient's dietary recall. Provided examples on ways to decrease sodium intake in diet. Discouraged intake of processed foods and use of salt shaker. Encouraged fresh fruits and vegetables as well as whole grain sources of carbohydrates to maximize fiber intake.   RD discussed why it is important for patient to adhere to diet recommendations, and emphasized the role of fluids, foods to avoid, and importance of weighing self daily. Teach back method used.  Expect good compliance.  Body mass index is 46.74 kg/m. Pt meets criteria for obesity class III based on current BMI.  Current diet order is 2 gram sodium, patient is consuming approximately 95-100% of meals at this time. Labs and medications reviewed. No further nutrition interventions warranted at this time. RD contact information provided. If additional nutrition issues arise, please re-consult RD.    Stephens, MS, RD, LDN Office: [phone] Pager: [phone] After Hours/Weekend Pager: [phone]  

## 2018-12-05 ENCOUNTER — Encounter: Payer: Medicaid Other | Admitting: Pain Medicine

## 2018-12-05 LAB — LEGIONELLA PNEUMOPHILA SEROGP 1 UR AG: L. pneumophila Serogp 1 Ur Ag: NEGATIVE

## 2018-12-05 LAB — BASIC METABOLIC PANEL
Anion gap: 7 (ref 5–15)
BUN: 17 mg/dL (ref 6–20)
CO2: 25 mmol/L (ref 22–32)
Calcium: 9.1 mg/dL (ref 8.9–10.3)
Chloride: 107 mmol/L (ref 98–111)
Creatinine, Ser: 0.48 mg/dL (ref 0.44–1.00)
GFR calc Af Amer: >60 mL/min (ref >60)
Glucose, Bld: 150 mg/dL — ABNORMAL HIGH (ref 70–99)
Potassium: 4.4 mmol/L (ref 3.5–5.1)
Sodium: 139 mmol/L (ref 135–145)

## 2018-12-05 MED ORDER — SERTRALINE HCL 50 MG PO TABS
25.0000 mg | ORAL_TABLET | Freq: Every day | ORAL | Status: DC
  Administered 2018-12-05: 25 mg via ORAL
  Filled 2018-12-05 (×2): qty 1

## 2018-12-05 MED ORDER — FUROSEMIDE 10 MG/ML IJ SOLN
40.0000 mg | Freq: Two times a day (BID) | INTRAMUSCULAR | Status: DC
  Administered 2018-12-05 – 2018-12-06 (×3): 40 mg via INTRAVENOUS
  Filled 2018-12-05 (×3): qty 4

## 2018-12-05 MED ORDER — POLYETHYLENE GLYCOL 3350 17 G PO PACK
17.0000 g | PACK | Freq: Every day | ORAL | Status: DC
  Administered 2018-12-05: 17 g via ORAL
  Filled 2018-12-05 (×2): qty 1

## 2018-12-05 NOTE — Progress Notes (Signed)
Cardiovascular and Pulmonary Nurse Navigator Note:    53 year old with PMHx of obesity, OSA, ongoing tobacco abuse, chronic diastolic CHF, neuropathy, hx of migraines, chronic pain who presented  with c/o SOB, lower extremity edema, cough and noted to have an acute respiratory failure with hypoxia secondary to COPD exacerbation and mild CHF. Last echo performed on 11/05/2018 with EF of 50 - 55%.  BNP on admission 259. Troponin < 0.03.  Patient is a current every day smoker.    CHF Education:? Patient sitting up in bed talking on the telephone and watching television.  Educational session with patient completed. Patient refused packet  "Living Better with Heart Failure" packet. Briefly reviewed definition of heart failure and signs and symptoms of an exacerbation.?Explained to patient that HF is a chronic illness which requires self-assessment / self-management along with help from the cardiologist/PCP.?? ? *Reviewed importance of and reason behind checking weight daily in the AM, after using the bathroom, but before getting dressed. Patient has scales, but are cracked.  Requesting a new set.  Patient stated she does not have the means to purchase scales.  RNCM informed and will provide patient with scales.   ? *Reviewed with patient the following information: *Discussed when to call the Dr= weight gain of >2-3lb overnight of 5lb in a week,  *Discussed yellow zone= call MD: weight gain of >2-3lb overnight of 5lb in a week, increased swelling, increased SOB when lying down, chest discomfort, dizziness, increased fatigue *Red Zone= call 911: struggle to breath, fainting or near fainting, significant chest pain   *Diet - Reviewed low sodium diet-provided handout of recommended and not recommended foods.?Dietitian Consultation for education completed yesterday.   ? *Discussed fluid intake with patient as well. Patient not currently on a fluid restriction, but advised no more than 64 ounces of fluids per  day.  Patient able to state the amount of fluid restriction.   ? *Instructed patient to take medications as prescribed for heart failure. Explained briefly why pt is on the medications (either make you feel better, live longer or keep you out of the hospital) and discussed monitoring and side effects.  ? *Discussed exercise. Patient is currently enrolled in Cardiac Rehab and will need medical clearance to return.  Patient stated she will check with her surgeon at [facility] before returning to [place].   ? *Smoking Cessation- Patient is a current smoker.  Patient prescribed nicotine patch while here in hospital.   ? *Morning Sun the purpose of the HF Clinic. ?Explained to patient the HF Clinic does not replace PCP nor Cardiologist, but is an additional resource to helping patient manage heart failure at home. ?Patient's  appointment is scheduled for 12/08/2018 at 3:00 p.m.  Patient informed this RN she may have to reschedule this appointment, as she needs to give her uncle more notice to be able to get off from work to take her to this appt.  Patient stated she plans to coordinate appointments with [facility] Cardiology, HF Clinic and PCP with a time that her Barbaraann Rondo will be able to take her since patient does not drive.     Cardiac Rehab - Patient is currently enrolled in Cardiac Rehab here at [facility], but will need medical clearance to return.  Please read above comments.    OTHER CONCERNS:  Patient asked this RN to contact [facility] - supplier of home oxygen at [phone] to ask them about her portable tank /  oxygen concentrator.  Patient stated after about 15 - 20 minutes of use an alert of "Low Purity" will come up on her display screen.  Patient wanting to know what this means.  Also, patient stated she did not get a Development worker, community with her portable oxygen concentrator.  This RN contacted [facility] at the number on the concentrator to  discuss the patient's concerns. Patient's portable concentrator needs to be replaced.  PLAN:  Patient instructed to contact [facility] when she is discharged and they will come out to your home to replace / switch the concentrator out and they will also bring you a Development worker, community for the portable concentrator.     Patient then started talking about how she is only 52 and the term heart failure sounds like she is going to die any minute.  Discussed with patient that HF does not mean your heart is about to stop, but does mean that it is not pumping effectively.  Again, we discussed possible causes for heart failure including valve problems.  Note:  Patient had valve replacement in September at [facility].  Patient tearful during this part of the discussion.  Patient stated she feels overwhelmed and finds that she is crying a lot.  Patient stated, "I feel like I'm depressed."  Allowed patient to express her feelings.  Empathetic listening used.  Patient requesting anti-depressant.  Dr. Anselm Jungling informed and will add medication.   ? Patient thanked me for providing the above information and listening to her.  ? ? Roanna Epley, RN, BSN, [facility]? [place] [facility]?P[facility] Rehab  Cardiovascular &?Pulmonary Nurse Navigator  Direct Line: [phone]  Department Phone #: [phone] Fax: [phone]? Email Address: [email or web address]

## 2018-12-05 NOTE — Plan of Care (Signed)

## 2018-12-05 NOTE — Progress Notes (Signed)
[facility] [place] NAME: Chelsea Ramsey    MR#:  [id number]  DATE OF BIRTH:  1965-12-29  SUBJECTIVE:   Patient admitted to the hospital secondary to shortness of breath, lower extremity edema, cough and noted to have acute respiratory failure with hypoxia secondary to COPD exacerbation and mild CHF.    Patient still complaining of some lower extremity edema and feels like she has some abdominal tightness/distention.  Complaining about her weight still being high.  REVIEW OF SYSTEMS:    Review of Systems  Constitutional: Negative for chills and fever.  HENT: Negative for congestion and tinnitus.   Eyes: Negative for blurred vision and double vision.  Respiratory: Positive for cough and shortness of breath. Negative for wheezing.   Cardiovascular: Positive for leg swelling. Negative for chest pain, orthopnea and PND.  Gastrointestinal: Negative for abdominal pain, diarrhea, nausea and vomiting.  Genitourinary: Negative for dysuria and hematuria.  Neurological: Negative for dizziness, sensory change and focal weakness.  All other systems reviewed and are negative.   Nutrition: Heart healthy Tolerating Diet: Yes Tolerating PT: Await Eval.   DRUG ALLERGIES:   Allergies  Allergen Reactions  . Amiodarone Nausea And Vomiting  . Aspirin Swelling  . Flexeril [Cyclobenzaprine] Swelling  . Trazamine [Trazodone & Diet Manage Prod] Nausea And Vomiting  . Codeine Rash  . Tramadol Rash    VITALS:  Blood pressure 123/82, pulse 84, temperature 97.9 F (36.6 C), temperature source Oral, resp. rate (!) 22, height 5\' 4"  (1.626 m), weight 124.3 kg, last menstrual period 11/08/2009, SpO2 98 %.  PHYSICAL EXAMINATION:   Physical Exam  GENERAL:  53 y.o.-year-old obese patient lying in bed in no acute distress.  EYES: Pupils equal, round, reactive to light and accommodation. No scleral icterus. Extraocular muscles intact.  HEENT: Head  atraumatic, normocephalic. Oropharynx and nasopharynx clear.  NECK:  Supple, no jugular venous distention. No thyroid enlargement, no tenderness.  LUNGS: Prolonged inspiratory and expiratory phase, end expiratory wheezing bilaterally, No Rales, no rhonchi, negative use of accessory muscles. CARDIOVASCULAR: S1, S2 normal. No murmurs, rubs, or gallops.  ABDOMEN: Soft, nontender, nondistended. Bowel sounds present. No organomegaly or mass.  EXTREMITIES: No cyanosis, clubbing, + 1 edema b/l NEUROLOGIC: Cranial nerves II through XII are intact. No focal Motor or sensory deficits b/l.   PSYCHIATRIC: The patient is alert and oriented x 3.  SKIN: No obvious rash, lesion, or ulcer.    LABORATORY PANEL:   CBC Recent Labs  Lab 12/03/18 0427  WBC 9.8  HGB 10.8*  HCT 36.4  PLT 204   ------------------------------------------------------------------------------------------------------------------  Chemistries  Recent Labs  Lab 12/01/18 1835  12/03/18 0427  12/05/18 0723  NA  --    < > 138   < > 139  K  --    < > 3.8   < > 4.4  CL  --    < > 101   < > 107  CO2  --    < > 30   < > 25  GLUCOSE  --    < > 106*   < > 150*  BUN  --    < > 15   < > 17  CREATININE  --    < > 0.55   < > 0.48  CALCIUM  --    < > 8.7*   < > 9.1  MG  --   --  2.1  --   --  AST 18  --   --   --   --   ALT 20  --   --   --   --   ALKPHOS 116  --   --   --   --   BILITOT 0.1*  --   --   --   --    < > = values in this interval not displayed.   ------------------------------------------------------------------------------------------------------------------  Cardiac Enzymes Recent Labs  Lab 12/02/18 1541  TROPONINI <0.03   ------------------------------------------------------------------------------------------------------------------  RADIOLOGY:  No results found.   ASSESSMENT AND PLAN:   53 year old female with past medical history of obesity, obstructive sleep apnea, ongoing tobacco abuse, COPD,  chronic diastolic CHF, neuropathy, history of migraines, chronic pain who presented to the hospital due to shortness of breath.  1.  Acute on chronic respiratory failure with hypoxia-secondary to CHF/COPD. - Continue diuresis with IV Lasix for underlying CHF. - cont. IV steroids, scheduled duo nebs and Pulmicort nebs.  2.  CHF-acute on chronic diastolic dysfunction.  Patient was on oral torsemide but apparently was not responding to it as per her.   - cont. IV Lasix - changed BID again today, follow I's and O's and daily weights.  Continue metoprolol. - appreciate Cardiology input.   3.  COPD- mild acute exacerbation secondary to ongoing tobacco abuse and also suspected pneumonia. -  Improving and will taper IV steroids, cont. scheduled duo nebs,  Pulmicort nebs. - Continue IV ceftriaxone, Zithromax.   4.  Tobacco abuse-continue nicotine patch.  5.  Essential hypertension-continue Lopressor.  6.  Paroxysmal atrial fibrillation-rate controlled.  Continue metoprolol.  Continue Xarelto.  7.  Chronic pain-continue patient's oxycodone. -Patient has high drug-seeking behavior and would not advance any pain meds.   All the records are reviewed and case discussed with Care Management/Social Worker. Management plans discussed with the patient, family and they are in agreement.  CODE STATUS: Full code  DVT Prophylaxis: Xarelto  TOTAL TIME TAKING CARE OF THIS PATIENT: 25 minutes.   POSSIBLE D/C IN 1-2 DAYS, DEPENDING ON CLINICAL CONDITION.   Vaughan Basta M.D on 12/05/2018 at 3:01 PM  Between 7am to 6pm - Pager - [phone]  After 6pm go to [email or web address] - Proofreader  [facility]  Hospitalists  Office  [phone]  CC: Primary care physician; Kathee Delton, MD

## 2018-12-05 NOTE — Progress Notes (Signed)
Patient frequently coughing, appears to be a dry cough but patient states if feels like she can't get the phlegm up. Administered PRN cough drops (Cepacol) and PRN tessalon medications to help with the cough. Earlier patient given pain med for her pain level is 7 on pain scale. Will continue to monitor to end of shift.

## 2018-12-05 NOTE — Care Management Note (Signed)
Case Management Note  Patient Details  Name: HARNOOR RETA MRN: [id number] Date of Birth: 1966/07/08  Subjective/Objective:     Patient is from home with husband.  Admitted with SOB and swelling.  Chronic 2L oxygen at home.  Uses 3L at night.  She is [facility] with her PCP at [facility].  Obtains medications at [facility].  Denies difficulties obtaining medications or with accessing medical care.  RNCM provided patient with a scale.  Has a heart failure clinic appointment.  Uses a walker and a cane at home.  Does not drive but can easily get to appointments with the help of family and friends.  No further needs identified at this time.                 Action/Plan:   Expected Discharge Date:  12/05/18               Expected Discharge Plan:  Home/Self Care  In-House Referral:     Discharge planning Services  CM Consult  Post Acute Care Choice:    Choice offered to:     DME Arranged:    DME Agency:     HH Arranged:    HH Agency:     Status of Service:  Completed, signed off  If discussed at [employer] of Stay Meetings, dates discussed:    Additional Comments:  Elza Rafter, RN 12/05/2018, 2:02 PM

## 2018-12-06 ENCOUNTER — Encounter: Payer: Self-pay | Admitting: *Deleted

## 2018-12-06 DIAGNOSIS — Z952 Presence of prosthetic heart valve: Secondary | ICD-10-CM

## 2018-12-06 LAB — BASIC METABOLIC PANEL
Anion gap: 8 (ref 5–15)
BUN: 20 mg/dL (ref 6–20)
CO2: 24 mmol/L (ref 22–32)
Calcium: 8.8 mg/dL — ABNORMAL LOW (ref 8.9–10.3)
Chloride: 107 mmol/L (ref 98–111)
Creatinine, Ser: 0.63 mg/dL (ref 0.44–1.00)
GFR calc Af Amer: >60 mL/min (ref >60)
GFR calc non Af Amer: >60 mL/min (ref >60)
GLUCOSE: 148 mg/dL — AB (ref 70–99)
Potassium: 3.9 mmol/L (ref 3.5–5.1)
Sodium: 139 mmol/L (ref 135–145)

## 2018-12-06 MED ORDER — GABAPENTIN 600 MG PO TABS: 300.0000 mg | ORAL_TABLET | Freq: Two times a day (BID) | ORAL | 0 refills | Status: AC

## 2018-12-06 MED ORDER — ALPRAZOLAM 0.25 MG PO TABS: 0.2500 mg | ORAL_TABLET | Freq: Every day | ORAL | 0 refills | Status: DC | PRN

## 2018-12-06 MED ORDER — FUROSEMIDE 40 MG PO TABS: 40.0000 mg | ORAL_TABLET | Freq: Two times a day (BID) | ORAL | 11 refills | Status: DC

## 2018-12-06 NOTE — Progress Notes (Signed)
Pt expressing c/o constipation, a fleet enema was offered, pt refusing at this time.

## 2018-12-07 LAB — CULTURE, BLOOD (ROUTINE X 2)
CULTURE: NO GROWTH
Culture: NO GROWTH

## 2018-12-08 ENCOUNTER — Encounter: Payer: Self-pay | Admitting: Family

## 2018-12-08 ENCOUNTER — Ambulatory Visit: Payer: Medicaid Other | Attending: Family | Admitting: Family

## 2018-12-08 VITALS — BP 108/63 | HR 98 | Resp 18 | Ht 64.0 in | Wt 277.0 lb

## 2018-12-08 DIAGNOSIS — J449 Chronic obstructive pulmonary disease, unspecified: Secondary | ICD-10-CM | POA: Diagnosis not present

## 2018-12-08 DIAGNOSIS — I5033 Acute on chronic diastolic (congestive) heart failure: Secondary | ICD-10-CM | POA: Insufficient documentation

## 2018-12-08 DIAGNOSIS — Z886 Allergy status to analgesic agent status: Secondary | ICD-10-CM | POA: Insufficient documentation

## 2018-12-08 DIAGNOSIS — Z96651 Presence of right artificial knee joint: Secondary | ICD-10-CM | POA: Insufficient documentation

## 2018-12-08 DIAGNOSIS — F17210 Nicotine dependence, cigarettes, uncomplicated: Secondary | ICD-10-CM | POA: Diagnosis not present

## 2018-12-08 DIAGNOSIS — F419 Anxiety disorder, unspecified: Secondary | ICD-10-CM | POA: Insufficient documentation

## 2018-12-08 DIAGNOSIS — Z79899 Other long term (current) drug therapy: Secondary | ICD-10-CM | POA: Insufficient documentation

## 2018-12-08 DIAGNOSIS — Z7901 Long term (current) use of anticoagulants: Secondary | ICD-10-CM | POA: Diagnosis not present

## 2018-12-08 DIAGNOSIS — I10 Essential (primary) hypertension: Secondary | ICD-10-CM

## 2018-12-08 DIAGNOSIS — Z825 Family history of asthma and other chronic lower respiratory diseases: Secondary | ICD-10-CM | POA: Insufficient documentation

## 2018-12-08 DIAGNOSIS — M797 Fibromyalgia: Secondary | ICD-10-CM | POA: Insufficient documentation

## 2018-12-08 DIAGNOSIS — I272 Pulmonary hypertension, unspecified: Secondary | ICD-10-CM | POA: Insufficient documentation

## 2018-12-08 DIAGNOSIS — I2510 Atherosclerotic heart disease of native coronary artery without angina pectoris: Secondary | ICD-10-CM | POA: Diagnosis not present

## 2018-12-08 DIAGNOSIS — I110 Hypertensive heart disease with heart failure: Secondary | ICD-10-CM | POA: Diagnosis not present

## 2018-12-08 DIAGNOSIS — Z888 Allergy status to other drugs, medicaments and biological substances status: Secondary | ICD-10-CM | POA: Insufficient documentation

## 2018-12-08 DIAGNOSIS — Z952 Presence of prosthetic heart valve: Secondary | ICD-10-CM | POA: Diagnosis not present

## 2018-12-08 DIAGNOSIS — M1711 Unilateral primary osteoarthritis, right knee: Secondary | ICD-10-CM | POA: Diagnosis not present

## 2018-12-08 DIAGNOSIS — G4733 Obstructive sleep apnea (adult) (pediatric): Secondary | ICD-10-CM | POA: Diagnosis not present

## 2018-12-08 DIAGNOSIS — Z885 Allergy status to narcotic agent status: Secondary | ICD-10-CM | POA: Diagnosis not present

## 2018-12-08 DIAGNOSIS — I4891 Unspecified atrial fibrillation: Secondary | ICD-10-CM | POA: Insufficient documentation

## 2018-12-08 DIAGNOSIS — I5031 Acute diastolic (congestive) heart failure: Secondary | ICD-10-CM

## 2018-12-08 DIAGNOSIS — K219 Gastro-esophageal reflux disease without esophagitis: Secondary | ICD-10-CM | POA: Diagnosis not present

## 2018-12-08 DIAGNOSIS — I480 Paroxysmal atrial fibrillation: Secondary | ICD-10-CM

## 2018-12-08 DIAGNOSIS — Z8249 Family history of ischemic heart disease and other diseases of the circulatory system: Secondary | ICD-10-CM | POA: Insufficient documentation

## 2018-12-08 MED ORDER — POTASSIUM CHLORIDE CRYS ER 20 MEQ PO TBCR: 20.0000 meq | EXTENDED_RELEASE_TABLET | Freq: Every day | ORAL | 0 refills | Status: DC

## 2018-12-08 MED ORDER — METOLAZONE 2.5 MG PO TABS: 2.5000 mg | ORAL_TABLET | Freq: Every day | ORAL | 0 refills | Status: DC

## 2018-12-08 NOTE — Patient Instructions (Signed)
Continue weighing daily and call for an overnight weight gain of > 2 pounds or a weekly weight gain of >5 pounds. 

## 2018-12-08 NOTE — Progress Notes (Signed)
Patient ID: Chelsea Ramsey, female    DOB: 10-Oct-1966, 53 y.o.   MRN: [id number]  HPI  Ms Hillman is a 53 y/o female with a history of CAD (leaky valve), HTN, pulmonary HTN, COPD, GERD, obstructive sleep apnea, previous tobacco use and chronic heart failure.   Echo report from 11/05/18 reviewed and showed an EF of 50-55% along with mild/moderate AR, mild MR,moderate TR and prosthetic mitral valve. Echo report from 08/11/18 reviewed and showed an EF of 40-45% which has declined from previous echo. + moderate TR and mild/moderately elevated PA pressure.  Echo report from 07/20/18 reviewed and showed an EF of 50-55% along with mild MR and an elevated PA pressure of 50 mm Hg.   Admitted 12/01/2018 due to COPD/ mild HF exacerbation. Cardiology consult obtained. Initially needed IV lasix and then transitioned to oral diuretics. Given IV solu-medrol and nebulizers as well. Discharged after 5 days. Admitted 11/17/18 due to HF exacerbation. Psychiatry and cardiology consults were obtained. IV lasix given along with higher amounts of oxygen. Weaned oxygen back to her 2L along with changing to oral diuretics. Discharged after 2 days. Admitted 11/04/18 due to atypical chest pain. Cardiology consult obtained. Medications adjusted and she was discharged after 2 days. Was in the ED 10/17/18 but left without being seen. Was in the ED 10/11/18 due to chest pain where she was treated and released. Admitted 08/04/18 due to dizziness and gait instability. Neurology consulted. CT/brain MRI were negative. Occipital nerve injection given for headache. Medications adjusted and discharged after 6 days. Admitted 07/17/18 due to acute on chronic heart failure. Initially given IV diuretics and then transitioned to oral diuretics. Cardiology consult obtained. Discharged after 4 days. Admitted 06/20/18 due to having her mitral valve replaced. Dobutamine and epinephrine were weaned and she was extubated. Had atrial fibrillation exacerbation  which involved medication adjustments. Had cardioversion done 06/30/18 with resultant short time in NSR but then reverted back to atrial fibrillation. Unable to tolerate amiodarone due to nausea and vomiting. Discharged after 22 days.   She presents today for a follow-up visit with a chief complaint of moderate shortness of breath upon minimal exertion. She describes this as chronic in nature having been present for several years. She has associated fatigue, dry cough, wheezing, abdominal distention, light-headedness, difficulty sleeping and weight gain along with this. She denies any palpitations, pedal edema or chest pain. Has been steadily gaining weight since she was discharged from the hospital.   Past Medical History:  Diagnosis Date  . Allergy    seasonal  . Anxiety   . Arthritis    Right Knee  . Asthma   . CHF (congestive heart failure) ([place])   . COPD (chronic obstructive pulmonary disease) ([place])   . Coronary artery disease    Leaky heart valve  . Fibromyalgia   . GERD (gastroesophageal reflux disease)   . Hypertension   . Pneumonia   . PUD (peptic ulcer disease)   . Pulmonary HTN ([place])   . Rheumatic fever/heart disease   . Sleep apnea    Past Surgical History:  Procedure Laterality Date  . ADENOIDECTOMY    . ESOPHAGOGASTRODUODENOSCOPY (EGD) WITH PROPOFOL N/A 05/25/2018   Procedure: ESOPHAGOGASTRODUODENOSCOPY (EGD) WITH PROPOFOL;  Surgeon: Lucilla Lame, MD;  Location[facility] ENDOSCOPY;  Service: Endoscopy;  Laterality: N/A;  . MITRAL VALVE REPLACEMENT    . MULTIPLE TOOTH EXTRACTIONS    . TONSILLECTOMY    . TOTAL KNEE ARTHROPLASTY Right 09/04/2016   Procedure: TOTAL KNEE ARTHROPLASTY;  with lateral release;  Surgeon: Earlie Server, MD;  Location: [facility];  Service: Orthopedics;  Laterality: Right;   Family History  Problem Relation Age of Onset  . COPD Mother   . Cancer Mother        Bone  . Asthma Mother   . Congestive Heart Failure Father    Social History   Tobacco Use   . Smoking status: Current Every Day Smoker    Packs/day: 0.50    Types: Cigarettes  . Smokeless tobacco: Never Used  Substance Use Topics  . Alcohol use: Not Currently    Alcohol/week: 3.0 standard drinks    Types: 3 Cans of beer per week    Comment: 16 oz per week   Allergies  Allergen Reactions  . Amiodarone Nausea And Vomiting  . Aspirin Swelling  . Flexeril [Cyclobenzaprine] Swelling  . Trazamine [Trazodone & Diet Manage Prod] Nausea And Vomiting  . Codeine Rash  . Tramadol Rash   Prior to Admission medications   Medication Sig Start Date End Date Taking? Authorizing Provider  ALPRAZolam (XANAX) 0.25 MG tablet Take 1 tablet (0.25 mg total) by mouth daily as needed for anxiety. 12/06/18  Yes Vaughan Basta, MD  bisacodyl (DULCOLAX) 5 MG EC tablet Take 2 tablets (10 mg total) by mouth daily as needed for moderate constipation. 07/21/18  Yes Epifanio Lesches, MD  budesonide-formoterol (SYMBICORT) 160-4.5 MCG/ACT inhaler Inhale 2 puffs into the lungs 2 (two) times daily. 02/26/16  Yes [provider]  Calcium Carbonate-Vit D-Min (GNP CALCIUM 1200) 1200-1000 MG-UNIT CHEW Chew 1,200 mg by mouth daily with breakfast. Take in combination with vitamin D and magnesium. 11/14/18 05/13/19 Yes Milinda Pointer, MD  Cholecalciferol (VITAMIN D3) 125 MCG (5000 UT) CAPS Take 1 capsule (5,000 Units total) by mouth daily with breakfast. Take along with calcium and magnesium. 11/14/18 05/13/19 Yes Milinda Pointer, MD  ergocalciferol (VITAMIN D2) 1.25 MG (50000 UT) capsule Take 1 capsule (50,000 Units total) by mouth 2 (two) times a week. X 6 weeks. 11/14/18 12/26/18 Yes Milinda Pointer, MD  furosemide (LASIX) 40 MG tablet Take 1 tablet (40 mg total) by mouth 2 (two) times daily. 12/06/18 12/06/19 Yes Vaughan Basta, MD  gabapentin (NEURONTIN) 600 MG tablet Take 0.5 tablets (300 mg total) by mouth 2 (two) times daily. Patient taking differently: Take 600 mg by mouth 2 (two)  times daily.  12/06/18  Yes Vaughan Basta, MD  guaiFENesin (MUCINEX) 600 MG 12 hr tablet Take 1 tablet (600 mg total) by mouth 2 (two) times daily. 11/19/18  Yes Salary, Montell D, MD  ipratropium-albuterol (DUONEB) 0.5-2.5 (3) MG/3ML SOLN Inhale 3 mLs into the lungs 3 (three) times daily as needed (respiratory).  02/26/16 12/20/18 Yes [provider]  lactulose (CHRONULAC) 10 GM/15ML solution Take 45 mLs (30 g total) by mouth 2 (two) times daily as needed for mild constipation. 07/21/18  Yes Epifanio Lesches, MD  Magnesium 500 MG CAPS Take 1 capsule (500 mg total) by mouth 2 (two) times daily at 8 am and 10 pm. 11/14/18 05/13/19 Yes Milinda Pointer, MD  magnesium oxide (MAG-OX) 400 MG tablet Take 400 mg by mouth 2 (two) times daily. 07/11/18 07/11/19 Yes [provider]  meclizine (ANTIVERT) 12.5 MG tablet Take 25 mg by mouth 3 (three) times daily as needed for dizziness.   Yes [provider]  metoprolol tartrate (LOPRESSOR) 100 MG tablet Take 1 tablet (100 mg total) by mouth 2 (two) times daily. 11/05/18  Yes Bettey Costa, MD  nicotine (NICODERM  CQ - DOSED IN MG/24 HR) 7 mg/24hr patch Place 1 patch (7 mg total) onto the skin daily. 11/06/18  Yes Mody, Ulice Bold, MD  nitroGLYCERIN (NITROSTAT) 0.4 MG SL tablet Place 1 tablet (0.4 mg total) under the tongue every 5 (five) minutes as needed for chest pain. 11/05/18  Yes Mody, Ulice Bold, MD  omeprazole (PRILOSEC) 40 MG capsule Take 40 mg by mouth 2 (two) times daily.   Yes [provider]  Oxycodone HCl 10 MG TABS Take 0.5-1 tablets (5-10 mg total) by mouth 2 (two) times daily as needed. Must last 30 days. MAX.: 2/day Patient taking differently: Take 5-10 mg by mouth 3 (three) times daily as needed. Must last 30 days. MAX.: 2/day 11/21/18 12/21/18 Yes Milinda Pointer, MD  potassium chloride SA (K-DUR,KLOR-CON) 20 MEQ tablet Take 1 tablet (20 mEq total) by mouth daily. While taking metolazone 11/21/18  Yes , Otila Kluver A,  FNP  promethazine (PHENERGAN) 12.5 MG tablet Take 1 tablet (12.5 mg total) by mouth every 6 (six) hours as needed for nausea or vomiting. 11/06/18  Yes Mody, Ulice Bold, MD  rivaroxaban (XARELTO) 20 MG TABS tablet Take 20 mg by mouth every evening.  07/11/18 07/11/19 Yes [provider]  tiZANidine (ZANAFLEX) 4 MG tablet Take 4 mg by mouth at bedtime.  08/10/18  Yes [provider]  topiramate (TOPAMAX) 25 MG tablet Take 25 mg by mouth 2 (two) times daily.  08/10/18  Yes [provider]     Review of Systems  Constitutional: Positive for fatigue (easily). Negative for appetite change.  HENT: Negative for congestion, postnasal drip and sore throat.   Eyes: Positive for visual disturbance (blurry vision at times).  Respiratory: Positive for cough (dry cough), shortness of breath (with minimal exertion) and wheezing.   Cardiovascular: Negative for chest pain, palpitations and leg swelling.  Gastrointestinal: Positive for abdominal distention and nausea. Negative for abdominal pain.  Endocrine: Negative.   Genitourinary: Negative.   Musculoskeletal: Negative for back pain and neck pain.  Skin: Negative.   Allergic/Immunologic: Negative.   Neurological: Positive for light-headedness. Negative for dizziness.  Hematological: Negative for adenopathy. Does not bruise/bleed easily.  Psychiatric/Behavioral: Positive for sleep disturbance (due to pain). Negative for dysphoric mood. The patient is nervous/anxious.    Vitals:   12/08/18 1304  BP: 108/63  Pulse: 98  Resp: 18  SpO2: 100%  Weight: 277 lb (125.6 kg)  Height: 5\' 4"  (1.626 m)   Wt Readings from Last 3 Encounters:  12/08/18 277 lb (125.6 kg)  12/06/18 273 lb 4.8 oz (124 kg)  11/19/18 263 lb 14.4 oz (119.7 kg)   Lab Results  Component Value Date   CREATININE 0.63 12/06/2018   CREATININE 0.48 12/05/2018   CREATININE 0.58 12/04/2018    Physical Exam Vitals signs and nursing note reviewed.  Constitutional:       Appearance: She is well-developed.  HENT:     Head: Normocephalic and atraumatic.  Neck:     Musculoskeletal: Normal range of motion and neck supple.     Vascular: No JVD.  Cardiovascular:     Rate and Rhythm: Normal rate and regular rhythm.  Pulmonary:     Effort: Pulmonary effort is normal. No respiratory distress.     Breath sounds: No wheezing or rales.  Abdominal:     General: There is distension.     Palpations: Abdomen is soft.  Musculoskeletal:     Right lower leg: She exhibits no tenderness. No edema.     Left  lower leg: She exhibits no tenderness. No edema.  Skin:    General: Skin is warm and dry.  Neurological:     Mental Status: She is alert and oriented to person, place, and time.  Psychiatric:        Mood and Affect: Mood is anxious.        Behavior: Behavior normal.    Assessment & Plan:  1: Acute on Chronic heart failure with preserved ejection fraction- - NYHA class III - moderately fluid overloaded today - weighing daily and she was instructed to call for an overnight weight gain of >2 pounds or a weekly weight gain of >5 pounds - weight up 10 pounds from last visit here 3 weeks ago - not adding salt and has been reading food labels. Reviewed the importance of closely following a 2000mg  sodium diet  - mostly drinking water during the day and an occasional soda - BNP 12/01/2018 was 259.0 - saw cardiology @ [name] 09/14/18 - renal function looks good so will add metolazone 2.5mg  daily for the next 3 days; take an additional 34meq potassium each day for 3 days as well - should she drop 10 pounds quickly, she's to skip a day in between metolazone doses; may need metolazone every few days - will get BMP at her next visit - discuss paramedic program with patient at her next visit  2: HTN- - BP looks good today - saw PCP @ [facility] 11/16/18  - BMP 12/06/2018 reviewed and showed sodium 139, potassium 3.9, creatinine 0.63 and GFR >60  3: Atrial fibrillation- - had mitral  valve replaced July 2019 - cardioverted 06/30/18   Patient did not bring her medications nor a list. Each medication was verbally reviewed with the patient and she was encouraged to bring the bottles to every visit to confirm accuracy of list.  Return in 4 days or sooner for any questions/problems before then.

## 2018-12-11 NOTE — Progress Notes (Signed)
Patient ID: Chelsea Ramsey, female    DOB: 1966-06-09, 53 y.o.   MRN: [id number]  HPI  [name] is a 53 y/o female with a history of CAD (leaky valve), HTN, pulmonary HTN, COPD, GERD, obstructive sleep apnea, previous tobacco use and chronic heart failure.   Echo report from 11/05/18 reviewed and showed an EF of 50-55% along with mild/moderate AR, mild MR,moderate TR and prosthetic mitral valve. Echo report from 08/11/18 reviewed and showed an EF of 40-45% which has declined from previous echo. + moderate TR and mild/moderately elevated PA pressure.  Echo report from 07/20/18 reviewed and showed an EF of 50-55% along with mild MR and an elevated PA pressure of 50 mm Hg.   Admitted 12/01/2018 due to COPD/ mild HF exacerbation. Cardiology consult obtained. Initially needed IV lasix and then transitioned to oral diuretics. Given IV solu-medrol and nebulizers as well. Discharged after 5 days. Admitted 11/17/18 due to HF exacerbation. Psychiatry and cardiology consults were obtained. IV lasix given along with higher amounts of oxygen. Weaned oxygen back to her 2L along with changing to oral diuretics. Discharged after 2 days. Admitted 11/04/18 due to atypical chest pain. Cardiology consult obtained. Medications adjusted and she was discharged after 2 days. Was in the ED 10/17/18 but left without being seen. Was in the ED 10/11/18 due to chest pain where she was treated and released. Admitted 08/04/18 due to dizziness and gait instability. Neurology consulted. CT/brain MRI were negative. Occipital nerve injection given for headache. Medications adjusted and discharged after 6 days. Admitted 07/17/18 due to acute on chronic heart failure. Initially given IV diuretics and then transitioned to oral diuretics. Cardiology consult obtained. Discharged after 4 days. Admitted 06/20/18 due to having her mitral valve replaced. Dobutamine and epinephrine were weaned and she was extubated. Had atrial fibrillation exacerbation  which involved medication adjustments. Had cardioversion done 06/30/18 with resultant short time in NSR but then reverted back to atrial fibrillation. Unable to tolerate amiodarone due to nausea and vomiting. Discharged after 22 days.   She presents today for a follow-up visit with a chief complaint of minimal fatigue upon moderate exertion. She describes this as chronic in nature having been present for several months but much improved from last visit here 4 days ago. She has associated abdominal distention (but much better) and has turned her nighttime oxygen back down to 2L. She has associated blurry vision at times along with this. She denies any difficulty sleeping, palpitations, pedal edema, chest pain, shortness of breath, cough, dizziness or weight gain. Was given 3 days of metolazone at last visit and she says that she only took 2 because of how much weight she lost and because her symptoms improved so much. She has the additional dose at home.   Past Medical History:  Diagnosis Date  . Allergy    seasonal  . Anxiety   . Arthritis    Right Knee  . Asthma   . CHF (congestive heart failure) ([place])   . COPD (chronic obstructive pulmonary disease) ([place])   . Coronary artery disease    Leaky heart valve  . Fibromyalgia   . GERD (gastroesophageal reflux disease)   . Hypertension   . Pneumonia   . PUD (peptic ulcer disease)   . Pulmonary HTN ([place])   . Rheumatic fever/heart disease   . Sleep apnea    Past Surgical History:  Procedure Laterality Date  . ADENOIDECTOMY    . ESOPHAGOGASTRODUODENOSCOPY (EGD) WITH PROPOFOL N/A 05/25/2018  Procedure: ESOPHAGOGASTRODUODENOSCOPY (EGD) WITH PROPOFOL;  Surgeon: Lucilla Lame, MD;  Location[facility] ENDOSCOPY;  Service: Endoscopy;  Laterality: N/A;  . MITRAL VALVE REPLACEMENT    . MULTIPLE TOOTH EXTRACTIONS    . TONSILLECTOMY    . TOTAL KNEE ARTHROPLASTY Right 09/04/2016   Procedure: TOTAL KNEE ARTHROPLASTY; with lateral release;  Surgeon: Earlie Server, MD;  Location: [place];  Service: Orthopedics;  Laterality: Right;   Family History  Problem Relation Age of Onset  . COPD Mother   . Cancer Mother        Bone  . Asthma Mother   . Congestive Heart Failure Father    Social History   Tobacco Use  . Smoking status: Current Every Day Smoker    Packs/day: 0.50    Types: Cigarettes  . Smokeless tobacco: Never Used  Substance Use Topics  . Alcohol use: Not Currently    Alcohol/week: 3.0 standard drinks    Types: 3 Cans of beer per week    Comment: 16 oz per week   Allergies  Allergen Reactions  . Amiodarone Nausea And Vomiting  . Aspirin Swelling  . Flexeril [Cyclobenzaprine] Swelling  . Trazamine [Trazodone & Diet Manage Prod] Nausea And Vomiting  . Codeine Rash  . Tramadol Rash   Prior to Admission medications   Medication Sig Start Date End Date Taking? Authorizing Provider  ALPRAZolam (XANAX) 0.25 MG tablet Take 1 tablet (0.25 mg total) by mouth daily as needed for anxiety. 12/06/18  Yes Vaughan Basta, MD  bisacodyl (DULCOLAX) 5 MG EC tablet Take 2 tablets (10 mg total) by mouth daily as needed for moderate constipation. 07/21/18  Yes Epifanio Lesches, MD  budesonide-formoterol (SYMBICORT) 160-4.5 MCG/ACT inhaler Inhale 2 puffs into the lungs 2 (two) times daily. 02/26/16  Yes [provider]  Calcium Carbonate-Vit D-Min (GNP CALCIUM 1200) 1200-1000 MG-UNIT CHEW Chew 1,200 mg by mouth daily with breakfast. Take in combination with vitamin D and magnesium. 11/14/18 05/13/19 Yes Milinda Pointer, MD  Cholecalciferol (VITAMIN D3) 125 MCG (5000 UT) CAPS Take 1 capsule (5,000 Units total) by mouth daily with breakfast. Take along with calcium and magnesium. 11/14/18 05/13/19 Yes Milinda Pointer, MD  ergocalciferol (VITAMIN D2) 1.25 MG (50000 UT) capsule Take 1 capsule (50,000 Units total) by mouth 2 (two) times a week. X 6 weeks. 11/14/18 12/26/18 Yes Milinda Pointer, MD  furosemide (LASIX) 40 MG tablet  Take 1 tablet (40 mg total) by mouth 2 (two) times daily. 12/06/18 12/06/19 Yes Vaughan Basta, MD  gabapentin (NEURONTIN) 600 MG tablet Take 0.5 tablets (300 mg total) by mouth 2 (two) times daily. Patient taking differently: Take 600 mg by mouth 2 (two) times daily.  12/06/18  Yes Vaughan Basta, MD  guaiFENesin (MUCINEX) 600 MG 12 hr tablet Take 1 tablet (600 mg total) by mouth 2 (two) times daily. 11/19/18  Yes Salary, Montell D, MD  ipratropium-albuterol (DUONEB) 0.5-2.5 (3) MG/3ML SOLN Inhale 3 mLs into the lungs 3 (three) times daily as needed (respiratory).  02/26/16 12/20/18 Yes [provider]  lactulose (CHRONULAC) 10 GM/15ML solution Take 45 mLs (30 g total) by mouth 2 (two) times daily as needed for mild constipation. 07/21/18  Yes Epifanio Lesches, MD  Magnesium 500 MG CAPS Take 1 capsule (500 mg total) by mouth 2 (two) times daily at 8 am and 10 pm. 11/14/18 05/13/19 Yes Milinda Pointer, MD  magnesium oxide (MAG-OX) 400 MG tablet Take 400 mg by mouth 2 (two) times daily. 07/11/18 07/11/19 Yes [provider]  meclizine (ANTIVERT) 12.5 MG tablet Take 25 mg by mouth 3 (three) times daily as needed for dizziness.   Yes [provider]  metolazone (ZAROXOLYN) 2.5 MG tablet Take 1 tablet (2.5 mg total) by mouth daily. 12/08/18 03/08/19 Yes Darylene Price A, FNP  metoprolol tartrate (LOPRESSOR) 100 MG tablet Take 1 tablet (100 mg total) by mouth 2 (two) times daily. 11/05/18  Yes Mody, Ulice Bold, MD  nicotine (NICODERM CQ - DOSED IN MG/24 HR) 7 mg/24hr patch Place 1 patch (7 mg total) onto the skin daily. 11/06/18  Yes Mody, Ulice Bold, MD  nitroGLYCERIN (NITROSTAT) 0.4 MG SL tablet Place 1 tablet (0.4 mg total) under the tongue every 5 (five) minutes as needed for chest pain. 11/05/18  Yes Mody, Ulice Bold, MD  omeprazole (PRILOSEC) 40 MG capsule Take 40 mg by mouth 2 (two) times daily.   Yes [provider]  Oxycodone HCl 10 MG TABS Take 0.5-1 tablets (5-10 mg total)  by mouth 2 (two) times daily as needed. Must last 30 days. MAX.: 2/day Patient taking differently: Take 5-10 mg by mouth 3 (three) times daily as needed. Must last 30 days. MAX.: 2/day 11/21/18 12/21/18 Yes Milinda Pointer, MD  potassium chloride SA (K-DUR,KLOR-CON) 20 MEQ tablet Take 1 tablet (20 mEq total) by mouth daily. While taking metolazone 12/08/18  Yes Hackney, Aura Fey, FNP  promethazine (PHENERGAN) 12.5 MG tablet Take 1 tablet (12.5 mg total) by mouth every 6 (six) hours as needed for nausea or vomiting. 11/06/18  Yes Mody, Ulice Bold, MD  rivaroxaban (XARELTO) 20 MG TABS tablet Take 20 mg by mouth every evening.  07/11/18 07/11/19 Yes [provider]  tiZANidine (ZANAFLEX) 4 MG tablet Take 4 mg by mouth at bedtime.  08/10/18  Yes [provider]  topiramate (TOPAMAX) 25 MG tablet Take 25 mg by mouth 2 (two) times daily.  08/10/18  Yes [provider]    Review of Systems  Constitutional: Positive for fatigue (minimal). Negative for appetite change.  HENT: Negative for congestion, postnasal drip and sore throat.   Eyes: Positive for visual disturbance (blurry vision at times).  Respiratory: Negative for cough, shortness of breath and wheezing.   Cardiovascular: Negative for chest pain, palpitations and leg swelling.  Gastrointestinal: Positive for abdominal distention ("much better"). Negative for abdominal pain and nausea.  Endocrine: Negative.   Genitourinary: Negative.   Musculoskeletal: Positive for arthralgias (left hand pain). Negative for back pain and neck pain.  Skin: Negative.   Allergic/Immunologic: Negative.   Neurological: Negative for dizziness and light-headedness.  Hematological: Negative for adenopathy. Does not bruise/bleed easily.  Psychiatric/Behavioral: Negative for dysphoric mood and sleep disturbance (oxygen at 2L at bedtime). The patient is not nervous/anxious.    Vitals:   12/12/18 0906  BP: 92/70  Pulse: 94  Resp: 18  SpO2: 97%   Weight: 268 lb 8 oz (121.8 kg)  Height: 5\' 4"  (1.626 m)   Wt Readings from Last 3 Encounters:  12/12/18 268 lb 8 oz (121.8 kg)  12/08/18 277 lb (125.6 kg)  12/06/18 273 lb 4.8 oz (124 kg)   Lab Results  Component Value Date   CREATININE 0.63 12/06/2018   CREATININE 0.48 12/05/2018   CREATININE 0.58 12/04/2018    Physical Exam Vitals signs and nursing note reviewed.  Constitutional:      Appearance: She is well-developed.  HENT:     Head: Normocephalic and atraumatic.  Neck:     Musculoskeletal: Normal range of motion and neck supple.  Vascular: No JVD.  Cardiovascular:     Rate and Rhythm: Normal rate and regular rhythm.  Pulmonary:     Effort: Pulmonary effort is normal. No respiratory distress.     Breath sounds: No wheezing or rales.  Abdominal:     General: There is no distension.     Palpations: Abdomen is soft.  Musculoskeletal:     Right lower leg: She exhibits no tenderness. No edema.     Left lower leg: She exhibits no tenderness. No edema.  Skin:    General: Skin is warm and dry.  Neurological:     Mental Status: She is alert and oriented to person, place, and time.     Sensory: Sensation is intact.     Motor: Motor function is intact.     Comments: + radial pulses bilaterally  Psychiatric:        Mood and Affect: Mood is not anxious.        Behavior: Behavior normal.    Assessment & Plan:  1: Chronic heart failure with preserved ejection fraction- - NYHA class III - euvolemic today - weighing daily and she was instructed to call for an overnight weight gain of >2 pounds or a weekly weight gain of >5 pounds - weight down 9 pounds from last visit here 4 days ago - she is to take the metolazone as needed for above weight gain parameters or worsening abdominal distention/ she will call if she takes it because she will then need a new prescription - not adding salt and has been reading food labels. Reviewed the importance of closely following a 2000mg   sodium diet  - mostly drinking water during the day and an occasional soda - BNP 12/01/2018 was 259.0 - saw cardiology @ [facility] 09/14/18 - she was given 3 days of metolazone 2.5mg  along with extra potassium at her last visit and she's only taken 2 doses so has 1 more dose remaining if needed - will get BMP today - PharmD reconciled medications with the patient  2: HTN- - BP on the low side but patient without dizziness.  - saw PCP @ [facility] 11/16/18  - BMP 12/06/2018 reviewed and showed sodium 139, potassium 3.9, creatinine 0.63 and GFR >60  3: Atrial fibrillation- - had mitral valve replaced July 2019 - cardioverted 06/30/18  4: Hand pain- - she says that she's had pain/burning in her left hand/ forearm since the "last IV was placed & they gave me phenergan" - hands feel same temperature and good pulses bilaterally - try warm compresses and follow-up with PCP should symptoms persist or worsen   Patient did not bring her medications nor a list. Each medication was verbally reviewed with the patient and she was encouraged to bring the bottles to every visit to confirm accuracy of list.  Return in 1 month or sooner for any questions/problems before then.

## 2018-12-12 ENCOUNTER — Telehealth: Payer: Self-pay | Admitting: Family

## 2018-12-12 ENCOUNTER — Ambulatory Visit: Payer: Medicaid Other | Attending: Family | Admitting: Family

## 2018-12-12 ENCOUNTER — Encounter: Payer: Self-pay | Admitting: Family

## 2018-12-12 VITALS — BP 92/70 | HR 94 | Resp 18 | Ht 64.0 in | Wt 268.5 lb

## 2018-12-12 DIAGNOSIS — F419 Anxiety disorder, unspecified: Secondary | ICD-10-CM | POA: Diagnosis not present

## 2018-12-12 DIAGNOSIS — M797 Fibromyalgia: Secondary | ICD-10-CM | POA: Diagnosis not present

## 2018-12-12 DIAGNOSIS — I110 Hypertensive heart disease with heart failure: Secondary | ICD-10-CM | POA: Diagnosis present

## 2018-12-12 DIAGNOSIS — Z884 Allergy status to anesthetic agent status: Secondary | ICD-10-CM | POA: Diagnosis not present

## 2018-12-12 DIAGNOSIS — K219 Gastro-esophageal reflux disease without esophagitis: Secondary | ICD-10-CM | POA: Diagnosis not present

## 2018-12-12 DIAGNOSIS — Z952 Presence of prosthetic heart valve: Secondary | ICD-10-CM | POA: Diagnosis not present

## 2018-12-12 DIAGNOSIS — I480 Paroxysmal atrial fibrillation: Secondary | ICD-10-CM

## 2018-12-12 DIAGNOSIS — F17210 Nicotine dependence, cigarettes, uncomplicated: Secondary | ICD-10-CM | POA: Diagnosis not present

## 2018-12-12 DIAGNOSIS — Z96651 Presence of right artificial knee joint: Secondary | ICD-10-CM | POA: Diagnosis not present

## 2018-12-12 DIAGNOSIS — J449 Chronic obstructive pulmonary disease, unspecified: Secondary | ICD-10-CM | POA: Diagnosis not present

## 2018-12-12 DIAGNOSIS — I4891 Unspecified atrial fibrillation: Secondary | ICD-10-CM | POA: Insufficient documentation

## 2018-12-12 DIAGNOSIS — Z886 Allergy status to analgesic agent status: Secondary | ICD-10-CM | POA: Diagnosis not present

## 2018-12-12 DIAGNOSIS — Z882 Allergy status to sulfonamides status: Secondary | ICD-10-CM | POA: Diagnosis not present

## 2018-12-12 DIAGNOSIS — M79643 Pain in unspecified hand: Secondary | ICD-10-CM | POA: Diagnosis not present

## 2018-12-12 DIAGNOSIS — Z7901 Long term (current) use of anticoagulants: Secondary | ICD-10-CM | POA: Diagnosis not present

## 2018-12-12 DIAGNOSIS — Z836 Family history of other diseases of the respiratory system: Secondary | ICD-10-CM | POA: Diagnosis not present

## 2018-12-12 DIAGNOSIS — I2510 Atherosclerotic heart disease of native coronary artery without angina pectoris: Secondary | ICD-10-CM | POA: Insufficient documentation

## 2018-12-12 DIAGNOSIS — Z885 Allergy status to narcotic agent status: Secondary | ICD-10-CM | POA: Insufficient documentation

## 2018-12-12 DIAGNOSIS — I272 Pulmonary hypertension, unspecified: Secondary | ICD-10-CM | POA: Insufficient documentation

## 2018-12-12 DIAGNOSIS — Z8249 Family history of ischemic heart disease and other diseases of the circulatory system: Secondary | ICD-10-CM | POA: Insufficient documentation

## 2018-12-12 DIAGNOSIS — Z79899 Other long term (current) drug therapy: Secondary | ICD-10-CM | POA: Diagnosis not present

## 2018-12-12 DIAGNOSIS — I5032 Chronic diastolic (congestive) heart failure: Secondary | ICD-10-CM | POA: Insufficient documentation

## 2018-12-12 DIAGNOSIS — Z888 Allergy status to other drugs, medicaments and biological substances status: Secondary | ICD-10-CM | POA: Insufficient documentation

## 2018-12-12 DIAGNOSIS — M79642 Pain in left hand: Secondary | ICD-10-CM

## 2018-12-12 DIAGNOSIS — I10 Essential (primary) hypertension: Secondary | ICD-10-CM

## 2018-12-12 DIAGNOSIS — G4733 Obstructive sleep apnea (adult) (pediatric): Secondary | ICD-10-CM | POA: Diagnosis not present

## 2018-12-12 LAB — BASIC METABOLIC PANEL
Anion gap: 11 (ref 5–15)
BUN: 14 mg/dL (ref 6–20)
CO2: 30 mmol/L (ref 22–32)
Calcium: 8.9 mg/dL (ref 8.9–10.3)
Chloride: 94 mmol/L — ABNORMAL LOW (ref 98–111)
Creatinine, Ser: 0.65 mg/dL (ref 0.44–1.00)
GFR calc Af Amer: >60 mL/min (ref >60)
GFR calc non Af Amer: >60 mL/min (ref >60)
Glucose, Bld: 140 mg/dL — ABNORMAL HIGH (ref 70–99)
Potassium: 3 mmol/L — ABNORMAL LOW (ref 3.5–5.1)
Sodium: 135 mmol/L (ref 135–145)

## 2018-12-12 MED ORDER — POTASSIUM CHLORIDE CRYS ER 20 MEQ PO TBCR: 20.0000 meq | EXTENDED_RELEASE_TABLET | Freq: Every day | ORAL | 5 refills | Status: DC

## 2018-12-12 NOTE — Patient Instructions (Addendum)
Continue weighing daily and call for an overnight weight gain of > 2 pounds or a weekly weight gain of >5 pounds.  Take the metolazone/ potassium as needed for above weight gain.

## 2018-12-12 NOTE — Telephone Encounter (Signed)
Spoke with patient regarding lab results obtained today (12/12/2018). Renal function looks great after taking 2 days of metolazone 2.5mg  although potassium has dropped to 3.0 (she did take an additional 75meq both days).  Advised patient to take 2 additional 87meq tablets of potassium both today and tomorrow (total of 3 tablets each day)  Will recheck labs in 1 month. Patient verbalized understanding.

## 2018-12-12 NOTE — Progress Notes (Signed)
[facility] - PHARMACIST COUNSELING NOTE   ASSESSMENT   (Brief summary of the presentation/problems identified): Pt states, she is doing well. Lost a good amount of weight in the past couple of week. Able to breathe better.    Adherence assessment   Patient is using pill bottles as an adherence strategy.   Do you ever forget to take your medication? [] Yes (1) [x] No (0)  Do you ever skip doses due to side effects? [] Yes (1) [x] No (0)  Do you have trouble affording your medicines? [] Yes (1) [x] No (0)  Are you ever unable to pick up your medication due to transportation difficulties? [] Yes (1) [x] No (0)  Do you ever stop taking your medications because you don't believe they are helping? [] Yes (1) [x] No (0)  Total score _0______      Guideline-Directed Medical Therapy/Evidence Based Medicine   ACE/ARB/ARNI: none   Beta Blocker: metoprolol tartrate   Aldosterone Antagonist: none Diuretic: furosemide    (Problem 1): HFpEF   PLAN   (Plan 1): Continue current regimen and call the clinic if you gain > 3 pounds in a night or > 5 pounds in a week.    SUBJECTIVE   HPI: Patient admitted to the hospital secondary to shortness of breath, lower extremity edema, cough and noted to have acute respiratory failure with hypoxia secondary to COPD exacerbation and mild CHF last week. Pt is doing well today.    Past Medical History:  Diagnosis Date  . Allergy    seasonal  . Anxiety   . Arthritis    Right Knee  . Asthma   . CHF (congestive heart failure) ([place])   . COPD (chronic obstructive pulmonary disease) ([place])   . Coronary artery disease    Leaky heart valve  . Fibromyalgia   . GERD (gastroesophageal reflux disease)   . Hypertension   . Pneumonia   . PUD (peptic ulcer disease)   . Pulmonary HTN ([place])   . Rheumatic fever/heart disease   . Sleep apnea       Current Outpatient Medications:  .  ALPRAZolam (XANAX) 0.25 MG tablet,  Take 1 tablet (0.25 mg total) by mouth daily as needed for anxiety., Disp: 7 tablet, Rfl: 0 .  bisacodyl (DULCOLAX) 5 MG EC tablet, Take 2 tablets (10 mg total) by mouth daily as needed for moderate constipation., Disp: 30 tablet, Rfl: 0 .  budesonide-formoterol (SYMBICORT) 160-4.5 MCG/ACT inhaler, Inhale 2 puffs into the lungs 2 (two) times daily., Disp: , Rfl:  .  Calcium Carbonate-Vit D-Min (GNP CALCIUM 1200) 1200-1000 MG-UNIT CHEW, Chew 1,200 mg by mouth daily with breakfast. Take in combination with vitamin D and magnesium., Disp: 30 tablet, Rfl: 5 .  Cholecalciferol (VITAMIN D3) 125 MCG (5000 UT) CAPS, Take 1 capsule (5,000 Units total) by mouth daily with breakfast. Take along with calcium and magnesium., Disp: 30 capsule, Rfl: 5 .  ergocalciferol (VITAMIN D2) 1.25 MG (50000 UT) capsule, Take 1 capsule (50,000 Units total) by mouth 2 (two) times a week. X 6 weeks., Disp: 12 capsule, Rfl: 0 .  furosemide (LASIX) 40 MG tablet, Take 1 tablet (40 mg total) by mouth 2 (two) times daily., Disp: 60 tablet, Rfl: 11 .  gabapentin (NEURONTIN) 600 MG tablet, Take 0.5 tablets (300 mg total) by mouth 2 (two) times daily. (Patient taking differently: Take 600 mg by mouth 2 (two) times daily. ), Disp: 60 tablet, Rfl: 0 .  guaiFENesin (MUCINEX) 600 MG 12 hr tablet,  Take 1 tablet (600 mg total) by mouth 2 (two) times daily., Disp: 20 tablet, Rfl: 0 .  ipratropium-albuterol (DUONEB) 0.5-2.5 (3) MG/3ML SOLN, Inhale 3 mLs into the lungs 3 (three) times daily as needed (respiratory). , Disp: , Rfl:  .  lactulose (CHRONULAC) 10 GM/15ML solution, Take 45 mLs (30 g total) by mouth 2 (two) times daily as needed for mild constipation., Disp: 240 mL, Rfl: 0 .  Magnesium 500 MG CAPS, Take 1 capsule (500 mg total) by mouth 2 (two) times daily at 8 am and 10 pm., Disp: 60 capsule, Rfl: 5 .  magnesium oxide (MAG-OX) 400 MG tablet, Take 400 mg by mouth 2 (two) times daily., Disp: , Rfl:  .  meclizine (ANTIVERT) 12.5 MG tablet,  Take 25 mg by mouth 3 (three) times daily as needed for dizziness., Disp: , Rfl:  .  metolazone (ZAROXOLYN) 2.5 MG tablet, Take 1 tablet (2.5 mg total) by mouth daily., Disp: 3 tablet, Rfl: 0 .  metoprolol tartrate (LOPRESSOR) 100 MG tablet, Take 1 tablet (100 mg total) by mouth 2 (two) times daily., Disp: 60 tablet, Rfl: 0 .  nicotine (NICODERM CQ - DOSED IN MG/24 HR) 7 mg/24hr patch, Place 1 patch (7 mg total) onto the skin daily., Disp: 28 patch, Rfl: 0 .  nitroGLYCERIN (NITROSTAT) 0.4 MG SL tablet, Place 1 tablet (0.4 mg total) under the tongue every 5 (five) minutes as needed for chest pain., Disp: 30 tablet, Rfl: 12 .  omeprazole (PRILOSEC) 40 MG capsule, Take 40 mg by mouth 2 (two) times daily., Disp: , Rfl:  .  Oxycodone HCl 10 MG TABS, Take 0.5-1 tablets (5-10 mg total) by mouth 2 (two) times daily as needed. Must last 30 days. MAX.: 2/day (Patient taking differently: Take 5-10 mg by mouth 3 (three) times daily as needed. Must last 30 days. MAX.: 2/day), Disp: 60 tablet, Rfl: 0 .  potassium chloride SA (K-DUR,KLOR-CON) 20 MEQ tablet, Take 1 tablet (20 mEq total) by mouth daily. While taking metolazone, Disp: 3 tablet, Rfl: 0 .  promethazine (PHENERGAN) 12.5 MG tablet, Take 1 tablet (12.5 mg total) by mouth every 6 (six) hours as needed for nausea or vomiting., Disp: 30 tablet, Rfl: 0 .  rivaroxaban (XARELTO) 20 MG TABS tablet, Take 20 mg by mouth every evening. , Disp: , Rfl:  .  tiZANidine (ZANAFLEX) 4 MG tablet, Take 4 mg by mouth at bedtime. , Disp: , Rfl:  .  topiramate (TOPAMAX) 25 MG tablet, Take 25 mg by mouth 2 (two) times daily. , Disp: , Rfl:     OBJECTIVE    BMP Latest Ref Rng & Units 12/06/2018 12/05/2018 12/04/2018  Glucose 70 - 99 mg/dL 148(H) 150(H) 157(H)  BUN 6 - 20 mg/dL 20 17 17   Creatinine 0.44 - 1.00 mg/dL 0.63 0.48 0.58  BUN/Creat Ratio 9 - 23 - - -  Sodium 135 - 145 mmol/L 139 139 136  Potassium 3.5 - 5.1 mmol/L 3.9 4.4 4.1  Chloride 98 - 111 mmol/L 107 107 102  CO2  22 - 32 mmol/L 24 25 25   Calcium 8.9 - 10.3 mg/dL 8.8(L) 9.1 9.2    Vital signs: HR 70s, BP n/a,  PE: Deferred  ECHO: Date 10/2018, EF 50-55%   DRUGS TO AVOID IN HEART FAILURE  Drug or Class Mechanism  Analgesics . NSAIDs . COX-2 inhibitors . Glucocorticoids  Sodium and water retention, increased systemic vascular resistance, decreased response to diuretics   Diabetes Medications . Metformin . Thiazolidinediones o  Rosiglitazone (Avandia) o Pioglitazone (Actos) . DPP4 Inhibitors o Saxagliptin (Onglyza) o Sitagliptin (Januvia)   Lactic acidosis Possible calcium channel blockade   Unknown  Antiarrhythmics . Class I  o Flecainide o Disopyramide . Class III o Sotalol . Other o Dronedarone  Negative inotrope, proarrhythmic   Proarrhythmic, beta blockade  Negative inotrope  Antihypertensives . Alpha Blockers o Doxazosin . Calcium Channel Blockers o Diltiazem o Verapamil o Nifedipine . Central Alpha Adrenergics o Moxonidine . Peripheral Vasodilators o Minoxidil  Increases renin and aldosterone  Negative inotrope    Possible sympathetic withdrawal  Unknown  Anti-infective . Itraconazole . Amphotericin B  Negative inotrope Unknown  Hematologic . Anagrelide . Cilostazol   Possible inhibition of PD IV Inhibition of PD III causing arrhythmias  Neurologic/Psychiatric . Stimulants . Anti-Seizure Drugs o Carbamazepine o Pregabalin . Antidepressants o Tricyclics o Citalopram . Parkinsons o Bromocriptine o Pergolide o Pramipexole . Antipsychotics o Clozapine . Antimigraine o Ergotamine o Methysergide . Appetite suppressants . Bipolar o Lithium  Peripheral alpha and beta agonist activity  Negative inotrope and chronotrope Calcium channel blockade  Negative inotrope, proarrhythmic Dose-dependent QT prolongation  Excessive serotonin activity/valvular damage Excessive serotonin activity/valvular damage Unknown  IgE mediated  hypersensitivy, calcium channel blockade  Excessive serotonin activity/valvular damage Excessive serotonin activity/valvular damage Valvular damage  Direct myofibrillar degeneration, adrenergic stimulation  Antimalarials . Chloroquine . Hydroxychloroquine Intracellular inhibition of lysosomal enzymes  Urologic Agents . Alpha Blockers o Doxazosin o Prazosin o Tamsulosin o Terazosin  Increased renin and aldosterone  Adapted from Page RL, et al. "Drugs That May Cause or Exacerbate Heart Failure: A Scientific Statement from the [place]." Circulation 2016; 960:A54-U98. DOI: 10.1161/CIR.0000000000000426   COUNSELING POINTS/CLINICAL PEARLS  Furosemide  Drug causes sun-sensitivity. Advise patient to use sunscreen and avoid tanning beds. Patient should avoid activities requiring coordination until drug effects are realized, as drug  may cause dizziness, vertigo, or blurred vision. This drug may cause hyperglycemia, hyperuricemia, constipation, diarrhea, loss of appetite, nausea, vomiting, purpuric disorder, cramps, spasticity, asthenia, headache, paresthesia, or scaling eczema. Instruct patient to report unusual bleeding/bruising or signs/symptoms of hypotension,  infection, pancreatitis, or ototoxicity (tinnitus, hearing impairment). Advise patient to report signs/symptoms of a severe skin reactions (flu-like symptoms,  spreading red rash, or skin/mucous membrane blistering) or erythema multiforme. Instruct patient to eat high-potassium foods during drug therapy, as directed by healthcare  professional.  Patient should not drink alcohol while taking this drug.    MEDICATION ADHERENCES TIPS AND STRATEGIES 1. Taking medication as prescribed improves patient outcomes in heart failure (reduces hospitalizations, improves symptoms, increases survival) 2. Side effects of medications can be managed by decreasing doses, switching agents, stopping drugs, or adding additional  therapy. Please let someone in the [facility] know if you have having bothersome side effects so we can modify your regimen. Do not alter your medication regimen without talking to [place].  3. Medication reminders can help patients remember to take drugs on time. If you are missing or forgetting doses you can try linking behaviors, using pill boxes, or an electronic reminder like an alarm on your phone or an app. Some people can also get automated phone calls as medication reminders.   Time spent: 10 min  Oswald Hillock, Pharm.D, BCPS Clinical Pharmacist 12/12/2018

## 2018-12-13 NOTE — Discharge Summary (Signed)
Chelsea Ramsey at [place] NAME: Chelsea Ramsey    MR#:  [id number]  DATE OF BIRTH:  08/26/1966  DATE OF ADMISSION:  12/01/2018 ADMITTING PHYSICIAN: Avel Peace Salary, MD  DATE OF DISCHARGE: 12/06/2018  3:00 PM  PRIMARY CARE PHYSICIAN: Kathee Delton, MD    ADMISSION DIAGNOSIS:  Acute diastolic congestive heart failure (HCC) [I50.31] Hypervolemia, unspecified hypervolemia type [E87.70] Acute on chronic congestive heart failure, unspecified heart failure type ([place]) [I50.9]  DISCHARGE DIAGNOSIS:  Active Problems:   CAP (community acquired pneumonia)   CHF (congestive heart failure) ([place])   SECONDARY DIAGNOSIS:   Past Medical History:  Diagnosis Date  . Allergy    seasonal  . Anxiety   . Arthritis    Right Knee  . Asthma   . CHF (congestive heart failure) ([place])   . COPD (chronic obstructive pulmonary disease) ([name])   . Coronary artery disease    Leaky heart valve  . Fibromyalgia   . GERD (gastroesophageal reflux disease)   . Hypertension   . Pneumonia   . PUD (peptic ulcer disease)   . Pulmonary HTN ([place])   . Rheumatic fever/heart disease   . Sleep apnea     HOSPITAL COURSE:   53 year old female with past medical history of obesity, obstructive sleep apnea, ongoing tobacco abuse, COPD, chronic diastolic CHF, neuropathy, history of migraines, chronic pain who presented to the hospital due to shortness of breath.  1.  Acute on chronic respiratory failure with hypoxia-secondary to CHF/COPD. - Continue diuresis with IV Lasix for underlying CHF. - cont. IV steroids, scheduled duo nebs and Pulmicort nebs.  2.  CHF-acute on chronic diastolic dysfunction.  Patient was on oral torsemide but apparently was not responding to it as per her.   - cont. IV Lasix - changed BID again today, follow I's and O's and daily weights.  Continue metoprolol. - appreciate Cardiology input.   3.  COPD- mild acute exacerbation secondary to  ongoing tobacco abuse and also suspected pneumonia. -  Improving and will taper IV steroids, cont. scheduled duo nebs,  Pulmicort nebs. - Continue IV ceftriaxone, Zithromax.   4.  Tobacco abuse-continue nicotine patch.  5.  Essential hypertension-continue Lopressor.  6.  Paroxysmal atrial fibrillation-rate controlled.  Continue metoprolol.  Continue Xarelto.  7.  Chronic pain-continue patient's oxycodone. -Patient has high drug-seeking behavior and would not advance any pain meds.   DISCHARGE CONDITIONS:   Stable.  CONSULTS OBTAINED:  Treatment Team:  Isaias Cowman, MD  DRUG ALLERGIES:   Allergies  Allergen Reactions  . Amiodarone Nausea And Vomiting  . Aspirin Swelling  . Flexeril [Cyclobenzaprine] Swelling  . Trazamine [Trazodone & Diet Manage Prod] Nausea And Vomiting  . Codeine Rash  . Tramadol Rash    DISCHARGE MEDICATIONS:   Allergies as of 12/06/2018      Reactions   Amiodarone Nausea And Vomiting   Aspirin Swelling   Flexeril [cyclobenzaprine] Swelling   Trazamine [trazodone & Diet Manage Prod] Nausea And Vomiting   Codeine Rash   Tramadol Rash      Medication List    STOP taking these medications   butalbital-acetaminophen-caffeine 50-325-40 MG tablet Commonly known as:  FIORICET, ESGIC   gabapentin 300 MG capsule Commonly known as:  NEURONTIN Replaced by:  gabapentin 600 MG tablet   torsemide 20 MG tablet Commonly known as:  DEMADEX     TAKE these medications   ALPRAZolam 0.25 MG tablet Commonly known as:  XANAX Take  1 tablet (0.25 mg total) by mouth daily as needed for anxiety.   bisacodyl 5 MG EC tablet Commonly known as:  DULCOLAX Take 2 tablets (10 mg total) by mouth daily as needed for moderate constipation.   ergocalciferol 1.25 MG (50000 UT) capsule Commonly known as:  VITAMIN D2 Take 1 capsule (50,000 Units total) by mouth 2 (two) times a week. X 6 weeks.   furosemide 40 MG tablet Commonly known as:  LASIX Take 1  tablet (40 mg total) by mouth 2 (two) times daily.   gabapentin 600 MG tablet Commonly known as:  NEURONTIN Take 0.5 tablets (300 mg total) by mouth 2 (two) times daily. Replaces:  gabapentin 300 MG capsule   GNP CALCIUM 1200 1200-1000 MG-UNIT Chew Chew 1,200 mg by mouth daily with breakfast. Take in combination with vitamin D and magnesium.   guaiFENesin 600 MG 12 hr tablet Commonly known as:  MUCINEX Take 1 tablet (600 mg total) by mouth 2 (two) times daily.   ipratropium-albuterol 0.5-2.5 (3) MG/3ML Soln Commonly known as:  DUONEB Inhale 3 mLs into the lungs 3 (three) times daily as needed (respiratory).   lactulose 10 GM/15ML solution Commonly known as:  CHRONULAC Take 45 mLs (30 g total) by mouth 2 (two) times daily as needed for mild constipation.   Magnesium 500 MG Caps Take 1 capsule (500 mg total) by mouth 2 (two) times daily at 8 am and 10 pm.   magnesium oxide 400 MG tablet Commonly known as:  MAG-OX Take 400 mg by mouth 2 (two) times daily.   meclizine 12.5 MG tablet Commonly known as:  ANTIVERT Take 25 mg by mouth 3 (three) times daily as needed for dizziness.   metoprolol tartrate 100 MG tablet Commonly known as:  LOPRESSOR Take 1 tablet (100 mg total) by mouth 2 (two) times daily.   nicotine 7 mg/24hr patch Commonly known as:  NICODERM CQ - dosed in mg/24 hr Place 1 patch (7 mg total) onto the skin daily.   nitroGLYCERIN 0.4 MG SL tablet Commonly known as:  NITROSTAT Place 1 tablet (0.4 mg total) under the tongue every 5 (five) minutes as needed for chest pain.   omeprazole 40 MG capsule Commonly known as:  PRILOSEC Take 40 mg by mouth 2 (two) times daily.   Oxycodone HCl 10 MG Tabs Take 0.5-1 tablets (5-10 mg total) by mouth 2 (two) times daily as needed. Must last 30 days. MAX.: 2/day What changed:  when to take this   promethazine 12.5 MG tablet Commonly known as:  PHENERGAN Take 1 tablet (12.5 mg total) by mouth every 6 (six) hours as needed  for nausea or vomiting.   rivaroxaban 20 MG Tabs tablet Commonly known as:  XARELTO Take 20 mg by mouth every evening.   SYMBICORT 160-4.5 MCG/ACT inhaler Generic drug:  budesonide-formoterol Inhale 2 puffs into the lungs 2 (two) times daily.   tiZANidine 4 MG tablet Commonly known as:  ZANAFLEX Take 4 mg by mouth at bedtime.   topiramate 25 MG tablet Commonly known as:  TOPAMAX Take 25 mg by mouth 2 (two) times daily.   Vitamin D3 125 MCG (5000 UT) Caps Take 1 capsule (5,000 Units total) by mouth daily with breakfast. Take along with calcium and magnesium.        DISCHARGE INSTRUCTIONS:    Stable.  If you experience worsening of your admission symptoms, develop shortness of breath, life threatening emergency, suicidal or homicidal thoughts you must seek medical attention immediately by  calling 911 or calling your MD immediately  if symptoms less severe.  You Must read complete instructions/literature along with all the possible adverse reactions/side effects for all the Medicines you take and that have been prescribed to you. Take any new Medicines after you have completely understood and accept all the possible adverse reactions/side effects.   Please note  You were cared for by a hospitalist during your hospital stay. If you have any questions about your discharge medications or the care you received while you were in the hospital after you are discharged, you can call the unit and asked to speak with the hospitalist on call if the hospitalist that took care of you is not available. Once you are discharged, your primary care physician will handle any further medical issues. Please note that NO REFILLS for any discharge medications will be authorized once you are discharged, as it is imperative that you return to your primary care physician (or establish a relationship with a primary care physician if you do not have one) for your aftercare needs so that they can reassess your  need for medications and monitor your lab values.    Today   CHIEF COMPLAINT:   Chief Complaint  Patient presents with  . Dizziness    HISTORY OF PRESENT ILLNESS:  Chelsea Ramsey  is a 53 y.o. female with a known history of recent discharge from the hospital roughly 2 weeks ago for congestive heart failure, returns with abdominal pain with distention, patient states that she thinks that she is fluid overloaded from her heart failure and needs diuretics to get the fluid off similar to last admission, in the emergency room patient was found to have potassium of 3.4, BNP 259, white count 14,000, chest x-ray noted for cardiomegaly, CT abdomen noted for questionable right sided pneumonia versus edema, patient evaluated in the emergency room, no apparent distress, resting comfortably in bed, patient is satting 100% on her chronic 2 L nasal cannula oxygen, patient is now being admitted for acute possible ? CAP, compensated congestive heart failure, acute hypokalemia.   VITAL SIGNS:  Blood pressure (!) 138/95, pulse 90, temperature (!) 97.5 F (36.4 C), temperature source Oral, resp. rate 18, height 5\' 4"  (1.626 m), weight 124 kg, last menstrual period 11/08/2009, SpO2 100 %.  I/O:  No intake or output data in the 24 hours ending 12/13/18 2124  PHYSICAL EXAMINATION:   GENERAL:  53 y.o.-year-old obese patient lying in bed in no acute distress.  EYES: Pupils equal, round, reactive to light and accommodation. No scleral icterus. Extraocular muscles intact.  HEENT: Head atraumatic, normocephalic. Oropharynx and nasopharynx clear.  NECK:  Supple, no jugular venous distention. No thyroid enlargement, no tenderness.  LUNGS: Prolonged inspiratory and expiratory phase, end expiratory wheezing bilaterally, No Rales, no rhonchi, negative use of accessory muscles. CARDIOVASCULAR: S1, S2 normal. No murmurs, rubs, or gallops.  ABDOMEN: Soft, nontender, nondistended. Bowel sounds present. No organomegaly  or mass.  EXTREMITIES: No cyanosis, clubbing, + 1 edema b/l NEUROLOGIC: Cranial nerves II through XII are intact. No focal Motor or sensory deficits b/l.   PSYCHIATRIC: The patient is alert and oriented x 3.  SKIN: No obvious rash, lesion, or ulcer.   DATA REVIEW:   CBC No results for input(s): WBC, HGB, HCT, PLT in the last 168 hours.  Chemistries  Recent Labs  Lab 12/12/18 0933  NA 135  K 3.0*  CL 94*  CO2 30  GLUCOSE 140*  BUN 14  CREATININE 0.65  CALCIUM 8.9    Cardiac Enzymes No results for input(s): TROPONINI in the last 168 hours.  Microbiology Results  Results for orders placed or performed during the hospital encounter of 12/01/18  Culture, blood (routine x 2) Call MD if unable to obtain prior to antibiotics being given     Status: None   Collection Time: 12/02/18  4:35 AM  Result Value Ref Range Status   Specimen Description BLOOD RIGHT FATTY CASTS  Final   Special Requests   Final    BOTTLES DRAWN AEROBIC AND ANAEROBIC Blood Culture results may not be optimal due to an excessive volume of blood received in culture bottles   Culture   Final    NO GROWTH 5 DAYS Performed at [facility], [address], [place], [place] [address]    Report Status 12/07/2018 FINAL  Final  Culture, blood (routine x 2) Call MD if unable to obtain prior to antibiotics being given     Status: None   Collection Time: 12/02/18  4:40 AM  Result Value Ref Range Status   Specimen Description BLOOD LEFT HAND  Final   Special Requests   Final    BOTTLES DRAWN AEROBIC AND ANAEROBIC Blood Culture results may not be optimal due to an excessive volume of blood received in culture bottles   Culture   Final    NO GROWTH 5 DAYS Performed at [facility], [address] [place], [place] [address]    Report Status 12/07/2018 FINAL  Final    RADIOLOGY:  No results found.  EKG:   Orders placed or performed during the hospital encounter of 12/01/18  . ED EKG within 10  minutes  . ED EKG within 10 minutes  . EKG      Management plans discussed with the patient, family and they are in agreement.  CODE STATUS:  Code Status History    Date Active Date Inactive Code Status Order ID Comments User Context   12/02/2018 0045 12/06/2018 1805 Full Code [id number]  Gorden Harms, MD ED   11/17/2018 1954 11/19/2018 1911 Full Code [id number]  Dustin Flock, MD Inpatient   11/04/2018 1601 11/06/2018 1358 Full Code [id number]  Nicholes Mango, MD Inpatient   05/22/2018 1222 05/25/2018 2208 Full Code [id number]  Demetrios Loll, MD Inpatient   04/22/2018 1440 04/25/2018 1736 Full Code [id number]  Epifanio Lesches, MD ED   12/20/2017 1946 12/21/2017 1447 Full Code [id number]  Gorden Harms, MD ED   09/04/2016 1449 09/05/2016 1715 Full Code [id number]  Chriss Czar, PA-C Inpatient      TOTAL TIME TAKING CARE OF THIS PATIENT: 35 minutes.    Vaughan Basta M.D on 12/13/2018 at 9:25 PM  Between 7am to 6pm - Pager - [phone]  After 6pm go to [email or web address] - password [email or web address] [facility]  Office  [phone]  CC: Primary care physician; Kathee Delton, MD   Note: This dictation was prepared with Dragon dictation along with smaller phrase technology. Any transcriptional errors that result from this process are unintentional.

## 2018-12-14 ENCOUNTER — Telehealth: Payer: Self-pay | Admitting: Family

## 2018-12-14 ENCOUNTER — Other Ambulatory Visit: Payer: Self-pay | Admitting: Family

## 2018-12-14 DIAGNOSIS — R51 Headache: Secondary | ICD-10-CM

## 2018-12-14 DIAGNOSIS — R200 Anesthesia of skin: Secondary | ICD-10-CM | POA: Insufficient documentation

## 2018-12-14 DIAGNOSIS — R202 Paresthesia of skin: Secondary | ICD-10-CM

## 2018-12-14 DIAGNOSIS — R519 Headache, unspecified: Secondary | ICD-10-CM | POA: Insufficient documentation

## 2018-12-14 DIAGNOSIS — H532 Diplopia: Secondary | ICD-10-CM | POA: Insufficient documentation

## 2018-12-14 DIAGNOSIS — R42 Dizziness and giddiness: Secondary | ICD-10-CM | POA: Insufficient documentation

## 2018-12-14 MED ORDER — METOLAZONE 2.5 MG PO TABS: 2.5000 mg | ORAL_TABLET | Freq: Every day | ORAL | 0 refills | Status: DC

## 2018-12-14 NOTE — Telephone Encounter (Signed)
Patient called to say that she's gained 4 pounds overnight and took the metolazone and extra 30meq potassium this morning. Have sent in a new RX for metolazone for her to have on hand.  She says that she didn't eat anything extra salty yesterday and didn't drink more fluids than normal. She says that she's going to try and get her cardiology and primary care transferred to [facility] as she's currently going to [facility] for this.

## 2018-12-19 ENCOUNTER — Ambulatory Visit: Payer: Medicaid Other | Attending: Pain Medicine | Admitting: Pain Medicine

## 2018-12-19 ENCOUNTER — Encounter: Payer: Self-pay | Admitting: Pain Medicine

## 2018-12-19 VITALS — BP 101/77 | HR 91 | Temp 98.1°F | Resp 16 | Ht 64.0 in | Wt 273.0 lb

## 2018-12-19 DIAGNOSIS — Z7901 Long term (current) use of anticoagulants: Secondary | ICD-10-CM

## 2018-12-19 DIAGNOSIS — M899 Disorder of bone, unspecified: Secondary | ICD-10-CM

## 2018-12-19 DIAGNOSIS — R0789 Other chest pain: Secondary | ICD-10-CM | POA: Diagnosis present

## 2018-12-19 DIAGNOSIS — E559 Vitamin D deficiency, unspecified: Secondary | ICD-10-CM

## 2018-12-19 DIAGNOSIS — M25561 Pain in right knee: Secondary | ICD-10-CM | POA: Diagnosis present

## 2018-12-19 DIAGNOSIS — M5442 Lumbago with sciatica, left side: Secondary | ICD-10-CM | POA: Diagnosis present

## 2018-12-19 DIAGNOSIS — G8929 Other chronic pain: Secondary | ICD-10-CM | POA: Diagnosis present

## 2018-12-19 DIAGNOSIS — M79605 Pain in left leg: Secondary | ICD-10-CM

## 2018-12-19 DIAGNOSIS — Z79899 Other long term (current) drug therapy: Secondary | ICD-10-CM | POA: Diagnosis present

## 2018-12-19 DIAGNOSIS — G894 Chronic pain syndrome: Secondary | ICD-10-CM

## 2018-12-19 DIAGNOSIS — Z6841 Body Mass Index (BMI) 40.0 and over, adult: Secondary | ICD-10-CM

## 2018-12-19 DIAGNOSIS — M533 Sacrococcygeal disorders, not elsewhere classified: Secondary | ICD-10-CM | POA: Diagnosis present

## 2018-12-19 DIAGNOSIS — R700 Elevated erythrocyte sedimentation rate: Secondary | ICD-10-CM

## 2018-12-19 DIAGNOSIS — R7982 Elevated C-reactive protein (CRP): Secondary | ICD-10-CM

## 2018-12-19 DIAGNOSIS — E66813 Obesity, class 3: Secondary | ICD-10-CM

## 2018-12-19 DIAGNOSIS — M1711 Unilateral primary osteoarthritis, right knee: Secondary | ICD-10-CM | POA: Diagnosis present

## 2018-12-19 DIAGNOSIS — M797 Fibromyalgia: Secondary | ICD-10-CM

## 2018-12-19 MED ORDER — OXYCODONE HCL 10 MG PO TABS: 5.0000 mg | ORAL_TABLET | Freq: Two times a day (BID) | ORAL | 0 refills | Status: DC | PRN

## 2018-12-19 NOTE — Patient Instructions (Addendum)
______________________________________________________________________________________________  Specialty Pain Scale  Introduction:  There are significant differences in how pain is reported. The word pain usually refers to physical pain, but it is also a common synonym of suffering. The medical community uses a scale from 0 (zero) to 10 (ten) to report pain level. Zero (0) is described as "no pain", while ten (10) is described as "the worse pain you can imagine". The problem with this scale is that physical pain is reported along with suffering. Suffering refers to mental pain, or more often yet it refers to any unpleasant feeling, emotion or aversion associated with the perception of harm or threat of harm. It is the psychological component of pain.  Pain Specialists prefer to separate the two components. The pain scale used by this practice is the Verbal Numerical Rating Scale (VNRS-11). This scale is for the physical pain only. DO NOT INCLUDE how your pain psychologically affects you. This scale is for adults 21 years of age and older. It has 11 (eleven) levels. The 1st level is 0/10. This means: "right now, I have no pain". In the context of pain management, it also means: "right now, my physical pain is under control with the current therapy".  General Information:  The scale should reflect your current level of pain. Unless you are specifically asked for the level of your worst pain, or your average pain. If you are asked for one of these two, then it should be understood that it is over the past 24 hours.  Levels 1 (one) through 5 (five) are described below, and can be treated as an outpatient. Ambulatory pain management facilities such as ours are more than adequate to treat these levels. Levels 6 (six) through 10 (ten) are also described below, however, these must be treated as a hospitalized patient. While levels 6 (six) and 7 (seven) may be evaluated at an urgent care facility, levels 8  (eight) through 10 (ten) constitute medical emergencies and as such, they belong in a hospital's emergency department. When having these levels (as described below), do not come to our office. Our facility is not equipped to manage these levels. Go directly to an urgent care facility or an emergency department to be evaluated.  Definitions:  Activities of Daily Living (ADL): Activities of daily living (ADL or ADLs) is a term used in healthcare to refer to people's daily self-care activities. Health professionals often use a person's ability or inability to perform ADLs as a measurement of their functional status, particularly in regard to people post injury, with disabilities and the elderly. There are two ADL levels: Basic and Instrumental. Basic Activities of Daily Living (BADL  or BADLs) consist of self-care tasks that include: Bathing and showering; personal hygiene and grooming (including brushing/combing/styling hair); dressing; Toilet hygiene (getting to the toilet, cleaning oneself, and getting back up); eating and self-feeding (not including cooking or chewing and swallowing); functional mobility, often referred to as "transferring", as measured by the ability to walk, get in and out of bed, and get into and out of a chair; the broader definition (moving from one place to another while performing activities) is useful for people with different physical abilities who are still able to get around independently. Basic ADLs include the things many people do when they get up in the morning and get ready to go out of the house: get out of bed, go to the toilet, bathe, dress, groom, and eat. On the average, loss of function typically follows a particular order.   Hygiene is the first to go, followed by loss of toilet use and locomotion. The last to go is the ability to eat. When there is only one remaining area in which the person is independent, there is a 62.9% chance that it is eating and only a 3.5% chance  that it is hygiene. Instrumental Activities of Daily Living (IADL or IADLs) are not necessary for fundamental functioning, but they let an individual live independently in a community. IADL consist of tasks that include: cleaning and maintaining the house; home establishment and maintenance; care of others (including selecting and supervising caregivers); care of pets; child rearing; managing money; managing financials (investments, etc.); meal preparation and cleanup; shopping for groceries and necessities; moving within the community; safety procedures and emergency responses; health management and maintenance (taking prescribed medications); and using the telephone or other form of communication.  Instructions:  Most patients tend to report their pain as a combination of two factors, their physical pain and their psychosocial pain. This last one is also known as "suffering" and it is reflection of how physical pain affects you socially and psychologically. From now on, report them separately.  From this point on, when asked to report your pain level, report only your physical pain. Use the following table for reference.  Pain Clinic Pain Levels (0-5/10)  Pain Level Score  Description  No Pain 0   Mild pain 1 Nagging, annoying, but does not interfere with basic activities of daily living (ADL). Patients are able to eat, bathe, get dressed, toileting (being able to get on and off the toilet and perform personal hygiene functions), transfer (move in and out of bed or a chair without assistance), and maintain continence (able to control bladder and bowel functions). Blood pressure and heart rate are unaffected. A normal heart rate for a healthy adult ranges from 60 to 100 bpm (beats per minute).   Mild to moderate pain 2 Noticeable and distracting. Impossible to hide from other people. More frequent flare-ups. Still possible to adapt and function close to normal. It can be very annoying and may have  occasional stronger flare-ups. With discipline, patients may get used to it and adapt.   Moderate pain 3 Interferes significantly with activities of daily living (ADL). It becomes difficult to feed, bathe, get dressed, get on and off the toilet or to perform personal hygiene functions. Difficult to get in and out of bed or a chair without assistance. Very distracting. With effort, it can be ignored when deeply involved in activities.   Moderately severe pain 4 Impossible to ignore for more than a few minutes. With effort, patients may still be able to manage work or participate in some social activities. Very difficult to concentrate. Signs of autonomic nervous system discharge are evident: dilated pupils (mydriasis); mild sweating (diaphoresis); sleep interference. Heart rate becomes elevated (>115 bpm). Diastolic blood pressure (lower number) rises above 100 mmHg. Patients find relief in laying down and not moving.   Severe pain 5 Intense and extremely unpleasant. Associated with frowning face and frequent crying. Pain overwhelms the senses.  Ability to do any activity or maintain social relationships becomes significantly limited. Conversation becomes difficult. Pacing back and forth is common, as getting into a comfortable position is nearly impossible. Pain wakes you up from deep sleep. Physical signs will be obvious: pupillary dilation; increased sweating; goosebumps; brisk reflexes; cold, clammy hands and feet; nausea, vomiting or dry heaves; loss of appetite; significant sleep disturbance with inability to fall asleep or to   remain asleep. When persistent, significant weight loss is observed due to the complete loss of appetite and sleep deprivation.  Blood pressure and heart rate becomes significantly elevated. Caution: If elevated blood pressure triggers a pounding headache associated with blurred vision, then the patient should immediately seek attention at an urgent or emergency care unit, as  these may be signs of an impending stroke.    Emergency Department Pain Levels (6-10/10)  Emergency Room Pain 6 Severely limiting. Requires emergency care and should not be seen or managed at an outpatient pain management facility. Communication becomes difficult and requires great effort. Assistance to reach the emergency department may be required. Facial flushing and profuse sweating along with potentially dangerous increases in heart rate and blood pressure will be evident.   Distressing pain 7 Self-care is very difficult. Assistance is required to transport, or use restroom. Assistance to reach the emergency department will be required. Tasks requiring coordination, such as bathing and getting dressed become very difficult.   Disabling pain 8 Self-care is no longer possible. At this level, pain is disabling. The individual is unable to do even the most "basic" activities such as walking, eating, bathing, dressing, transferring to a bed, or toileting. Fine motor skills are lost. It is difficult to think clearly.   Incapacitating pain 9 Pain becomes incapacitating. Thought processing is no longer possible. Difficult to remember your own name. Control of movement and coordination are lost.   The worst pain imaginable 10 At this level, most patients pass out from pain. When this level is reached, collapse of the autonomic nervous system occurs, leading to a sudden drop in blood pressure and heart rate. This in turn results in a temporary and dramatic drop in blood flow to the brain, leading to a loss of consciousness. Fainting is one of the body's self defense mechanisms. Passing out puts the brain in a calmed state and causes it to shut down for a while, in order to begin the healing process.    Summary: 1. Refer to this scale when providing [place] with your pain level. 2. Be accurate and careful when reporting your pain level. This will help with your care. 3. Over-reporting your pain level will  lead to loss of credibility. 4. Even a level of 1/10 means that there is pain and will be treated at our facility. 5. High, inaccurate reporting will be documented as "Symptom Exaggeration", leading to loss of credibility and suspicions of possible secondary gains such as obtaining more narcotics, or wanting to appear disabled, for fraudulent reasons. 6. Only pain levels of 5 or below will be seen at our facility. 7. Pain levels of 6 and above will be sent to the Emergency Department and the appointment cancelled. ______________________________________________________________________________________________   ____________________________________________________________________________________________  Weight Management Required  URGENT: Your weight has been found to be adversely affecting your health.  Dear Chelsea Ramsey:  Your current Body mass index is 46.86 kg/m.Marland Kitchen Estimated body mass index is 46.86 kg/m as calculated from the following:   Height as of this encounter: 5\' 4"  (1.626 m).   Weight as of this encounter: 273 lb (123.8 kg).  Your last four (4) weight and BMI calculations are as follows: Wt Readings from Last 4 Encounters:  12/19/18 273 lb (123.8 kg)  12/12/18 268 lb 8 oz (121.8 kg)  12/08/18 277 lb (125.6 kg)  12/06/18 273 lb 4.8 oz (124 kg)   BMI Readings from Last 4 Encounters:  12/19/18 46.86 kg/m  12/12/18 46.09 kg/m  12/08/18 47.55 kg/m  12/06/18 46.91 kg/m    Calculations estimate your ideal body weight to be: Ideal body weight: 54.7 kg (120 lb 9.5 oz) Adjusted ideal body weight: 82.4 kg (181 lb 8.9 oz)  Please use the table below to identify your weight category and associated incidence of chronic pain, secondary to your weight.  BMI interpretation table: BMI level Category Associated incidence of chronic pain  <18 kg/m2 Underweight   18.5-24.9 kg/m2 Ideal body weight   25-29.9 kg/m2 Overweight  20%  30-34.9 kg/m2 Obese (Class I)  68%  35-39.9  kg/m2 Severe obesity (Class II)  136%  >40 kg/m2 Extreme obesity (Class III)  254%   In addition: You will be considered "Morbidly Obese", if your BMI is above 30 and you have one or more of the following conditions that are directly associated with obesity: 8. Type 2 Diabetes (Which in turn can lead to cardiovascular diseases (CVD), stroke, peripheral vascular diseases (PVD), retinopathy, nephropathy, and neuropathy) 9. Cardiovascular Disease  10. Breathing problems (Asthma, obesity-hypoventilation syndrome, obstructive sleep apnea, chronic inflammatory airway disease) 11. Chronic kidney disease 12. Liver disease (nonalcoholic fatty liver disease) 13. High blood pressure 14. Acid reflux (gastroesophageal reflux disease) 15. Osteoarthritis (OA) 16. Low back pain (Lumbar Facet Syndrome) 17. Hip pain (Osteoarthritis of hip) 18. Knee pain (Osteoarthritis of knee) (patients with a BMI>30 kg/m2 were 6.8 times more likely to develop knee OA than normal-weight individuals) 19. Certain types of cancer. (Epidemiological studies have shown that obesity is a risk factor for: post-menopausal breast cancer; cancers of the endometrium, colon and kidney; malignant adenomas of the oesophagus. Obese subjects have an approximately 1.5-3.5-fold increased risk of developing these cancers compared with normal-weight subjects, and it has been estimated that between 15 and 45% of these cancers can be attributed to overweight. More recent studies suggest that obesity may also increase the risk of other types of cancer, including pancreatic, hepatic and gallbladder cancer. Ref: Obesity and cancer. Pischon T, Nthlings U, [employer] H. Proc Nutr Soc. 2008 May;67(2):128-45. doi: 16.1096/E4540981191478295.)  Recommendation: At this point it is urgent that you take a step back and concentrate in loosing weight. Because most chronic pain patients do have difficulty exercising secondary to their pain, you must rely on proper  nutrition and dieting in order to lose the weight. If your BMI is above 40, you should seriously consider bariatric surgery. A realistic goal is to lose 10% of your body weight over a period of 12 months.  If over time you have unsuccessfully try to lose weight, then it is time for you to seek professional help and to enter a medically supervised weight management program.  Pain management considerations:  1. Pharmacological Problems: Be advised that the use of opioid analgesics has been associated with decreased metabolism and weight gain.  For this reason, should we see that you are unable to lose weight while taking these medications, it may become necessary for [place] to taper down and indefinitely discontinue these medicines.  2. Technical Problems: The incidence of successful interventional therapies decreases as the patient's BMI increases. It is much more difficult to accomplish a safe and effective interventional therapy on a patient with a BMI above 35. Yours is Body mass index is 46.86 kg/m.Marland Kitchen 3. Radiation Exposure Problems: The x-rays machine, used to accomplish injection therapies, will automatically increase their x-ray output in order to capture an appropriate bone image. This means that radiation exposure increases exponentially with the patient's BMI. (The higher the BMI, the  higher the radiation exposure.) Although the level of radiation used at a given time is still safe to the patient, it is not for the physician and/or assisting staff. Unfortunately, radiation exposure is accumulative. Because physicians and the staff have to do procedures and be exposed on a daily basis, this can result in health problems such as cancer and radiation burns. Radiation exposure to the staff is monitored by the radiation batches that they wear. The exposure levels are reported back to the staff on a quarterly basis. Depending on levels of exposure, physicians and staff may be obligated by law to decrease this  exposure. This means that they have the right and obligation to refuse providing therapies where they may be overexposed to radiation. For this reason, physicians may decline to offer therapies such as radiofrequency ablation or implants to patients with a BMI above 40. ____________________________________________________________________________________________  ____________________________________________________________________________________________  Preparing for Procedure with Sedation  Instructions: . Oral Intake: Do not eat or drink anything for at least 8 hours prior to your procedure. . Transportation: Public transportation is not allowed. Bring an adult driver. The driver must be physically present in our waiting room before any procedure can be started. Marland Kitchen Physical Assistance: Bring an adult physically capable of assisting you, in the event you need help. This adult should keep you company at home for at least 6 hours after the procedure. . Blood Pressure Medicine: Take your blood pressure medicine with a sip of water the morning of the procedure. . Blood thinners: Notify our staff if you are taking any blood thinners. Depending on which one you take, there will be specific instructions on how and when to stop it. . Diabetics on insulin: Notify the staff so that you can be scheduled 1st case in the morning. If your diabetes requires high dose insulin, take only  of your normal insulin dose the morning of the procedure and notify the staff that you have done so. . Preventing infections: Shower with an antibacterial soap the morning of your procedure. . Build-up your immune system: Take 1000 mg of Vitamin C with every meal (3 times a day) the day prior to your procedure. Marland Kitchen Antibiotics: Inform the staff if you have a condition or reason that requires you to take antibiotics before dental procedures. . Pregnancy: If you are pregnant, call and cancel the procedure. . Sickness: If you have a  cold, fever, or any active infections, call and cancel the procedure. . Arrival: You must be in the facility at least 30 minutes prior to your scheduled procedure. . Children: Do not bring children with you. . Dress appropriately: Bring dark clothing that you would not mind if they get stained. . Valuables: Do not bring any jewelry or valuables.  Procedure appointments are reserved for interventional treatments only. Marland Kitchen No Prescription Refills. . No medication changes will be discussed during procedure appointments. . No disability issues will be discussed.  Reasons to call and reschedule or cancel your procedure: (Following these recommendations will minimize the risk of a serious complication.) . Surgeries: Avoid having procedures within 2 weeks of any surgery. (Avoid for 2 weeks before or after any surgery). . Flu Shots: Avoid having procedures within 2 weeks of a flu shots or . (Avoid for 2 weeks before or after immunizations). . Barium: Avoid having a procedure within 7-10 days after having had a radiological study involving the use of radiological contrast. (Myelograms, Barium swallow or enema study). . Heart attacks: Avoid any elective procedures or  surgeries for the initial 6 months after a "Myocardial Infarction" (Heart Attack). . Blood thinners: It is imperative that you stop these medications before procedures. Let us know if you if you take any blood thinner.  . Infection: Avoid procedures during or within two weeks of an infection (including chest colds or gastrointestinal problems). Symptoms associated with infections include: Localized redness, fever, chills, night sweats or profuse sweating, burning sensation when voiding, cough, congestion, stuffiness, runny nose, sore throat, diarrhea, nausea, vomiting, cold or Flu symptoms, recent or current infections. It is specially important if the infection is over the area that we intend to treat. Marland Kitchen Heart and lung problems: Symptoms that may  suggest an active cardiopulmonary problem include: cough, chest pain, breathing difficulties or shortness of breath, dizziness, ankle swelling, uncontrolled high or unusually low blood pressure, and/or palpitations. If you are experiencing any of these symptoms, cancel your procedure and contact your primary care physician for an evaluation.  Remember:  Regular Business hours are:  Monday to Thursday 8:00 AM to 4:00 PM  Provider's Schedule: Milinda Pointer, MD:  Procedure days: Tuesday and Thursday 7:30 AM to 4:00 PM  Gillis Santa, MD:  Procedure days: Monday and Wednesday 7:30 AM to 4:00 PM ____________________________________________________________________________________________   ____________________________________________________________________________________________  Blood Thinners  Recommended Time Interval Before and After Neuraxial Block or Catheter Removal  Drug (Generic) Brand Name Time Before Time After Comments  Abciximab Reopro 15 days 2 hours   Alteplase Activase 10 days 10 days   Apixaban Eliquis 3 days 6 hours   Aspirin > 325 mg Goody Powders/Excedrin 11 days  (Usually not stopped)  Aspirin ? 81 mg  7 days  (Usually not stopped)  Cholesterol Medication Lipitor 4 days    Cilostazol Pletal 3 days 5 hours   Clopidogrel Plavix 7-10 days 2 hours   Dabigatran Pradaxa 5 days 6 hours   Delteparin Fragmin 24 hours 4 hours   Dipyridamole + ASA Aggrenox 11days 2 hours   Enoxaparin  Lovenox 24 hours 4 hours   Eptifibatide Integrillin 8 hours 2 hours   Fish oil  4 days    Fondaparinux  Arixtra 72 hours 12 hours   Garlic supplements  7 days    Ginkgo biloba  36 hours    Ginseng  24 hours    Heparin (IV)  4 hours 2 hours   Heparin ([place])  12 hours 2 hours   Hydroxychloroquine Plaquenil 11 days    LMW Heparin  24 hours    LMWH  24 hours    NSAIDs  3 days  (Usually not stopped)  Prasugrel Effient 7-10 days 6 hours   Reteplase Retavase 10 days 10 days   Rivaroxaban  Xarelto 3 days 6 hours   Streptokinase Streptase 10 days 10 days   Tenecteplase TNKase 10 days 10 days   Thrombolytics  10 days  10 days Avoid x 10 days after inj.  Ticagrelor Brilinta 5-7 days 6 hours   Ticlodipine Ticlid 10-14 days 2 hours   Tinzaparin Innohep 24 hours 4 hours   Tirofiban Aggrastat 8 hours 2 hours   Vitamin E  4 days    Warfarin Coumadin 5 days 2 hours   ____________________________________________________________________________________________

## 2018-12-19 NOTE — Progress Notes (Signed)
Nursing Pain Medication Assessment:  Safety precautions to be maintained throughout the outpatient stay will include: orient to surroundings, keep bed in low position, maintain call bell within reach at all times, provide assistance with transfer out of bed and ambulation.  Medication Inspection Compliance: Pill count conducted under aseptic conditions, in front of the patient. Neither the pills nor the bottle was removed from the patient's sight at any time. Once count was completed pills were immediately returned to the patient in their original bottle.  Medication: Oxycodone IR Pill/Patch Count: 9 of 60 pills remain Pill/Patch Appearance: Markings consistent with prescribed medication Bottle Appearance: Standard pharmacy container. Clearly labeled. Filled Date: 73 / 23 / 2019 Last Medication intake:  Yesterday

## 2018-12-19 NOTE — Progress Notes (Signed)
Patient's Name: Chelsea Ramsey  MRN: [id number]  Referring Provider: Kathee Delton, MD  DOB: 12-18-1965  PCP: Kathee Delton, MD  DOS: 12/19/2018  Note by: Gaspar Cola, MD  Service setting: Ambulatory outpatient  Specialty: Interventional Pain Management  Location: [facility] (AMB) Pain Management Facility    Patient type: Established   Primary Reason(s) for Visit: Encounter for prescription drug management. (Level of risk: moderate)  CC: Back Pain (lower bilateral, right is worse); Leg Pain (right ); and Foot Pain (left )  HPI  Chelsea Ramsey is a 53 y.o. year old, female patient, who comes today for a medication management evaluation. She has Osteoarthritis of knee (Right); COPD (chronic obstructive pulmonary disease) with acute bronchitis (HCC); COPD (chronic obstructive pulmonary disease) ([place]); A-fib [name]); GIB (gastrointestinal bleeding); Intractable vomiting with nausea; Melena; Nausea and vomiting; Acute esophagogastric ulcer; Acute CHF (congestive heart failure) ([place]); Chronic diastolic heart failure ([place]); HTN (hypertension); Chronic low back pain (Primary Area of Pain) (Bilateral) w/ sciatica (Left); Chronic lower extremity pain (Secondary Area of Pain) (Left); Sternal pain (Tertiary Area of Pain); Fibromyalgia (Fourth Area of Pain); Chronic knee pain (Fifth Area of Pain) (Right); Chronic sacroiliac joint pain; Chronic pain syndrome; Long term current use of opiate analgesic; Pharmacologic therapy; Disorder of skeletal system; Problems influencing health status; Chest pain; Class 3 severe obesity with body mass index (BMI) of 45.0 to 49.9 in adult [facility]); Chronic anticoagulation (Xarelto); Vitamin D deficiency; 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 Her primarily concern today is the Back Pain (lower bilateral, right is worse); Leg Pain (right ); and Foot Pain (left )  Pain Assessment: Location: Lower, Left, Right(right and left ) Back(leg, foot ) Radiating: back pain going into outer aspect of the left leg  Onset: More than a month ago Duration: Chronic pain Quality: Discomfort, Crushing, Constant, Numbness Severity: 7 /10 (subjective, self-reported pain score)  Note: Reported level is inconsistent with clinical observations. Clinically the patient looks like a 2/10 A 2/10 is viewed as "Mild to Moderate" and described as noticeable and distracting. Impossible to hide from other people. More frequent flare-ups. Still possible to adapt and function close to normal. It can be very annoying and may have occasional stronger flare-ups. With discipline, patients may get used to it and adapt. Information on the proper use of the pain scale provided to the patient today. When using our objective Pain Scale, levels between 6 and 10/10 are said to belong in an emergency room, as it progressively worsens from a 6/10, described as severely limiting, requiring emergency care not usually available at an outpatient pain management facility. At a 6/10 level, communication becomes difficult and requires great effort. Assistance to reach the emergency department may be required. Facial flushing and profuse sweating along with potentially dangerous increases in heart rate and blood pressure will be evident. Effect on ADL: when standing or sitting, the outer aspect of the left leg goes numb and affects her balance and gait  Timing: Constant Modifying factors: rest,  pain never stops completely.  medications.  BP: 101/77  HR: 91  Chelsea Ramsey was last scheduled for an appointment on 11/14/2018 for medication management. During today's appointment we reviewed Chelsea Ramsey's chronic pain status, as well as her outpatient medication regimen.  The patient   reports previous drug  use. Her body mass index is 46.86 kg/m.  Further details on both, my assessment(s), as well as the proposed treatment plan, please see below.  Controlled Substance Pharmacotherapy Assessment REMS (Risk Evaluation and Mitigation Strategy)  Analgesic: Oxycodone IR 10 mg 0.5-1 tablet p.o. twice daily (20 mg/day of oxycodone) MME/day: 30 mg/day.  Chelsea Billow, RN  12/19/2018  1:35 PM  Sign when Signing Visit Nursing Pain Medication Assessment:  Safety precautions to be maintained throughout the outpatient stay will include: orient to surroundings, keep bed in low position, maintain call bell within reach at all times, provide assistance with transfer out of bed and ambulation.  Medication Inspection Compliance: Pill count conducted under aseptic conditions, in front of the patient. Neither the pills nor the bottle was removed from the patient's sight at any time. Once count was completed pills were immediately returned to the patient in their original bottle.  Medication: Oxycodone IR Pill/Patch Count: 9 of 60 pills remain Pill/Patch Appearance: Markings consistent with prescribed medication Bottle Appearance: Standard pharmacy container. Clearly labeled. Filled Date: 44 / 23 / 2019 Last Medication intake:  Yesterday   Pharmacokinetics: Liberation and absorption (onset of action): WNL Distribution (time to peak effect): WNL Metabolism and excretion (duration of action): WNL         Pharmacodynamics: Desired effects: Analgesia: [name] reports >50% benefit. Functional ability: Patient reports that medication allows her to accomplish basic ADLs Clinically meaningful improvement in function (CMIF): Sustained CMIF goals met Perceived effectiveness: Described as relatively effective, allowing for increase in activities of daily living (ADL) Undesirable effects: Side-effects or Adverse reactions: None reported Monitoring: [place] PMP: Online review of the past  68-monthperiod conducted. Compliant with practice rules and regulations Last UDS on record: Summary  Date Value Ref Range Status  10/10/2018 FINAL  Final    Comment:    ==================================================================== TOXASSURE COMP DRUG ANALYSIS,UR ==================================================================== Test                             Result       Flag       Units Drug Present and Declared for Prescription Verification   Hydrocodone                    2789         EXPECTED   ng/mg creat   Hydromorphone                  617          EXPECTED   ng/mg creat   Dihydrocodeine                 379          EXPECTED   ng/mg creat   Norhydrocodone                 1250         EXPECTED   ng/mg creat    Sources of hydrocodone include scheduled prescription    medications. Hydromorphone, dihydrocodeine and norhydrocodone are    expected metabolites of hydrocodone. Hydromorphone and    dihydrocodeine are also available as scheduled prescription    medications.   Gabapentin                     PRESENT      EXPECTED   Acetaminophen  PRESENT      EXPECTED   Metoprolol                     PRESENT      EXPECTED Drug Present not Declared for Prescription Verification   Salicylate                     PRESENT      UNEXPECTED Drug Absent but Declared for Prescription Verification   Topiramate                     Not Detected UNEXPECTED   Tizanidine                     Not Detected UNEXPECTED    Tizanidine, as indicated in the declared medication list, is not    always detected even when used as directed.   Diltiazem                      Not Detected UNEXPECTED ==================================================================== Test                      Result    Flag   Units      Ref Range   Creatinine              201              mg/dL      >=20 ==================================================================== Declared Medications:  The flagging  and interpretation on this report are based on the  following declared medications.  Unexpected results may arise from  inaccuracies in the declared medications.  **Note: The testing scope of this panel includes these medications:  Diltiazem  Gabapentin (Neurontin)  Hydrocodone (Norco)  Metoprolol  Topiramate  **Note: The testing scope of this panel does not include small to  moderate amounts of these reported medications:  Acetaminophen (Norco)  Tizanidine  **Note: The testing scope of this panel does not include following  reported medications:  Albuterol  Albuterol (Duoneb)  Budesonide (Symbicort)  Bumetanide (Bumex)  Docusate (Dulcolax)  Formoterol (Symbicort)  Ipratropium (Atrovent)  Ipratropium (Duoneb)  Iron (Ferrous Sulfate)  Lactulose  Magnesium Oxide  Omeprazole  Rivaroxaban ==================================================================== For clinical consultation, please call [phone]. ====================================================================    UDS interpretation: Compliant          Medication Assessment Form: Reviewed. Patient indicates being compliant with therapy Treatment compliance: Compliant Risk Assessment Profile: Aberrant behavior: See prior evaluations. None observed or detected today Comorbid factors increasing risk of overdose: See prior notes. No additional risks detected today Opioid risk tool (ORT) (Total Score):   Personal History of Substance Abuse (SUD-Substance use disorder):  Alcohol:    Illegal Drugs:    Rx Drugs:    ORT Risk Level calculation:   Risk of substance use disorder (SUD): Low  ORT Scoring interpretation table:  Score <3 = Low Risk for SUD  Score between 4-7 = Moderate Risk for SUD  Score >8 = High Risk for Opioid Abuse   Risk Mitigation Strategies:  Patient Counseling: Covered Patient-Prescriber Agreement (PPA): Present and active  Notification to other healthcare providers: Done  Pharmacologic  Plan: No change in therapy, at this time.             Laboratory Chemistry  Inflammation Markers (CRP: Acute Phase) (ESR: Chronic Phase) Lab Results  Component Value Date   CRP 11 (H) 10/10/2018   ESRSEDRATE 79 (  H) 10/10/2018                         Rheumatology Markers Lab Results  Component Value Date   LABURIC 10.6 (H) 02/24/2018                        Renal Function Markers Lab Results  Component Value Date   BUN 14 12/12/2018   CREATININE 0.65 12/12/2018   BCR 18 10/10/2018   GFRAA >60 12/12/2018   GFRNONAA >60 12/12/2018                             Hepatic Function Markers Lab Results  Component Value Date   AST 18 12/01/2018   ALT 20 12/01/2018   ALBUMIN 3.7 12/01/2018   ALKPHOS 116 12/01/2018   LIPASE 37 12/01/2018                        Electrolytes Lab Results  Component Value Date   NA 135 12/12/2018   K 3.0 (L) 12/12/2018   CL 94 (L) 12/12/2018   CALCIUM 8.9 12/12/2018   MG 2.1 12/03/2018   PHOS 4.1 12/03/2018                        Neuropathy Markers Lab Results  Component Value Date   VITAMINB12 615 10/10/2018   HGBA1C 5.2 08/21/2014   HIV Non Reactive 12/02/2018                        CNS Tests No results found.  Bone Pathology Markers Lab Results  Component Value Date   25OHVITD1 9.6 (L) 10/10/2018   25OHVITD2 <1.0 10/10/2018   25OHVITD3 9.4 10/10/2018                         Coagulation Parameters Lab Results  Component Value Date   INR 1.24 11/17/2018   LABPROT 15.5 (H) 11/17/2018   APTT 26 05/22/2018   PLT 204 12/03/2018                        Cardiovascular Markers Lab Results  Component Value Date   BNP 259.0 (H) 12/01/2018   CKTOTAL 39 12/17/2013   CKMB 0.9 03/28/2013   TROPONINI <0.03 12/02/2018   HGB 10.8 (L) 12/03/2018   HCT 36.4 12/03/2018                         CA Markers No results found.  Note: Lab results reviewed.  Recent Diagnostic Imaging Results  ECHOCARDIOGRAM COMPLETE                  *[place], [place] [address]                            [phone]  ------------------------------------------------------------------- Transthoracic Echocardiography  Patient:    Genita, Nilsson MR #:  [id number] Study Date: 11/05/2018 Gender:     F Age:        40 Height:     162.6 cm Weight:     105.1 kg BSA:        2.23 m^2 Pt. Status: Room:       30A   ATTENDING    Mody, Sital P  ADMITTING    Gouru, Aruna  ORDERING     Gouru, Aruna  REFERRING    Gouru, Aruna  SONOGRAPHER  [facility]  PERFORMING   [facility]  cc:  ------------------------------------------------------------------- LV EF: 50% -   55%  ------------------------------------------------------------------- Indications:      Dyspnea 786.09.  ------------------------------------------------------------------- Study Conclusions  - Procedure narrative: Transthoracic echocardiography for left   ventricular function evaluation, for right ventricular function   evaluation, and for assessment of valvular function. Image   quality was suboptimal. The study was technically difficult.   Intravenous contrast (Definity) was administered. - Left ventricle: Systolic function was normal. The estimated   ejection fraction was in the range of 50% to 55%. - Aortic valve: There was mild to moderate regurgitation. - Mitral valve: A bioprosthesis was present. There was mild   regurgitation. - Right ventricle: The cavity size was mildly dilated. - Right atrium: The atrium was mildly dilated. - Tricuspid valve: There was moderate regurgitation.  ------------------------------------------------------------------- Study data:   Study status:  Routine.  Procedure:  The patient reported no pain pre or post test. Transthoracic echocardiography for left ventricular function evaluation, for right  ventricular function evaluation, and for assessment of valvular function. Image quality was suboptimal. The study was technically difficult. Intravenous contrast (Definity) was administered.  Study completion:  There were no complications.          Transthoracic echocardiography.  M-mode, complete 2D, spectral Doppler, and color Doppler.  Birthdate:  Patient birthdate: July 12, 1966.  Age:  Patient is 53 yr old.  Sex:  Gender: female.    BMI: 39.7 kg/m^2.  Patient status:  Inpatient.  Study date:  Study date: 11/05/2018. Study time: 09:26 AM.  Location:  Echo laboratory.  -------------------------------------------------------------------  ------------------------------------------------------------------- Left ventricle:  Systolic function was normal. The estimated ejection fraction was in the range of 50% to 55%.  ------------------------------------------------------------------- Aortic valve:   Doppler:  There was mild to moderate regurgitation.   ------------------------------------------------------------------- Mitral valve:  A bioprosthesis was present.  Doppler:  There was mild regurgitation.    Valve area by continuity equation (using LVOT flow): 2.22 cm^2. Indexed valve area by continuity equation (using LVOT flow): 0.99 cm^2/m^2.    Mean gradient (D): 8 mm Hg. Peak gradient (D): 20 mm Hg.  ------------------------------------------------------------------- Left atrium:  The atrium was at the upper limits of normal in size.   ------------------------------------------------------------------- Right ventricle:  The cavity size was mildly dilated.  ------------------------------------------------------------------- Tricuspid valve:   Doppler:  There was moderate regurgitation.   ------------------------------------------------------------------- Right atrium:  The atrium was mildly  dilated.  ------------------------------------------------------------------- Measurements   Left ventricle                           Value          Reference  LV ID, ED, PLAX chordal                  45.7  mm       43 - 52  LV ID, ES, PLAX chordal  32.4  mm       23 - 38  LV fx shortening, PLAX chordal           29    %        >=29  LV PW thickness, ED                      10.3  mm       ----------  IVS/LV PW ratio, ED                      0.77           <=1.3  Stroke volume, 2D                        116   ml       ----------  Stroke volume/bsa, 2D                    52    ml/m^2   ----------    Ventricular septum                       Value          Reference  IVS thickness, ED                        7.91  mm       ----------    LVOT                                     Value          Reference  LVOT ID, S                               22    mm       ----------  LVOT area                                3.8   cm^2     ----------  LVOT ID                                  22    mm       ----------  LVOT peak velocity, S                    163   cm/s     ----------  LVOT mean velocity, S                    115   cm/s     ----------  LVOT VTI, S                              30.6  cm       ----------  LVOT peak gradient, S                    11    mm Hg    ----------  Stroke volume (SV), LVOT DP  116.3 ml       ----------  Stroke index (SV/bsa), LVOT DP           52.1  ml/m^2   ----------    Aorta                                    Value          Reference  Aortic root ID, ED                       27    mm       ----------    Left atrium                              Value          Reference  LA ID, A-P, ES                           59    mm       ----------  LA ID/bsa, A-P                 (H)       2.64  cm/m^2   <=2.2  LA volume, S                             117   ml       ----------  LA volume/bsa, S                         52.4  ml/m^2   ----------  LA  volume, ES, 1-p A4C                   110   ml       ----------  LA volume/bsa, ES, 1-p A4C               49.3  ml/m^2   ----------  LA volume, ES, 1-p A2C                   124   ml       ----------  LA volume/bsa, ES, 1-p A2C               55.6  ml/m^2   ----------    Mitral valve                             Value          Reference  Mitral E-wave peak velocity              225   cm/s     ----------  Mitral A-wave peak velocity              145   cm/s     ----------  Mitral mean velocity, D                  134   cm/s     ----------  Mitral deceleration time                 229   ms  150 - 230  Mitral mean gradient, D                  8     mm Hg    ----------  Mitral peak gradient, D                  20    mm Hg    ----------  Mitral E/A ratio, peak                   1.6            ----------  Mitral valve area, LVOT                  2.22  cm^2     ----------  continuity  Mitral valve area/bsa, LVOT              0.99  cm^2/m^2 ----------  continuity  Mitral annulus VTI, D                    52.3  cm       ----------    Right atrium                             Value          Reference  RA ID, S-I, ES, A4C            (H)       66.2  mm       34 - 49  RA area, ES, A4C               (H)       26.7  cm^2     8.3 - 19.5  RA volume, ES, A/L                       88.4  ml       ----------  RA volume/bsa, ES, A/L                   39.6  ml/m^2   ----------    Right ventricle                          Value          Reference  RV ID, ED, PLAX                (H)       53.2  mm       19 - 38  RV s&', lateral, S                        10.7  cm/s     ----------  Legend: (L)  and  (H)  mark values outside specified reference range.  ------------------------------------------------------------------- Prepared and Electronically Authenticated by  Miquel Dunn, MD 2020-01-03T12:23:10  Complexity Note: Imaging results reviewed. Results shared with Ms. Arko, using Layman's terms.                          Meds   Current Outpatient Medications:  .  ALPRAZolam (XANAX) 0.25 MG tablet, Take 1 tablet (0.25 mg total) by mouth daily as needed for anxiety., Disp: 7 tablet, Rfl: 0 .  bisacodyl (DULCOLAX) 5 MG EC tablet, Take 2 tablets (  10 mg total) by mouth daily as needed for moderate constipation., Disp: 30 tablet, Rfl: 0 .  budesonide-formoterol (SYMBICORT) 160-4.5 MCG/ACT inhaler, Inhale 2 puffs into the lungs 2 (two) times daily., Disp: , Rfl:  .  Calcium Carbonate-Vit D-Min (GNP CALCIUM 1200) 1200-1000 MG-UNIT CHEW, Chew 1,200 mg by mouth daily with breakfast. Take in combination with vitamin D and magnesium., Disp: 30 tablet, Rfl: 5 .  Cholecalciferol (VITAMIN D3) 125 MCG (5000 UT) CAPS, Take 1 capsule (5,000 Units total) by mouth daily with breakfast. Take along with calcium and magnesium., Disp: 30 capsule, Rfl: 5 .  ergocalciferol (VITAMIN D2) 1.25 MG (50000 UT) capsule, Take 1 capsule (50,000 Units total) by mouth 2 (two) times a week. X 6 weeks., Disp: 12 capsule, Rfl: 0 .  furosemide (LASIX) 40 MG tablet, Take 1 tablet (40 mg total) by mouth 2 (two) times daily., Disp: 60 tablet, Rfl: 11 .  gabapentin (NEURONTIN) 600 MG tablet, Take 0.5 tablets (300 mg total) by mouth 2 (two) times daily. (Patient taking differently: Take 600 mg by mouth 3 (three) times daily. ), Disp: 60 tablet, Rfl: 0 .  guaiFENesin (MUCINEX) 600 MG 12 hr tablet, Take 1 tablet (600 mg total) by mouth 2 (two) times daily., Disp: 20 tablet, Rfl: 0 .  ipratropium-albuterol (DUONEB) 0.5-2.5 (3) MG/3ML SOLN, Inhale 3 mLs into the lungs 3 (three) times daily as needed (respiratory). , Disp: , Rfl:  .  lactulose (CHRONULAC) 10 GM/15ML solution, Take 45 mLs (30 g total) by mouth 2 (two) times daily as needed for mild constipation., Disp: 240 mL, Rfl: 0 .  magnesium oxide (MAG-OX) 400 MG tablet, Take 400 mg by mouth 2 (two) times daily., Disp: , Rfl:  .  meclizine (ANTIVERT) 12.5 MG tablet, Take 25 mg by mouth 3  (three) times daily as needed for dizziness., Disp: , Rfl:  .  metolazone (ZAROXOLYN) 2.5 MG tablet, Take 1 tablet (2.5 mg total) by mouth daily., Disp: 10 tablet, Rfl: 0 .  metoprolol tartrate (LOPRESSOR) 100 MG tablet, Take 1 tablet (100 mg total) by mouth 2 (two) times daily., Disp: 60 tablet, Rfl: 0 .  nicotine (NICODERM CQ - DOSED IN MG/24 HR) 7 mg/24hr patch, Place 1 patch (7 mg total) onto the skin daily., Disp: 28 patch, Rfl: 0 .  nitroGLYCERIN (NITROSTAT) 0.4 MG SL tablet, Place 1 tablet (0.4 mg total) under the tongue every 5 (five) minutes as needed for chest pain., Disp: 30 tablet, Rfl: 12 .  nortriptyline (PAMELOR) 10 MG capsule, Take 10 mg by mouth Nightly., Disp: , Rfl:  .  omeprazole (PRILOSEC) 40 MG capsule, Take 40 mg by mouth 2 (two) times daily., Disp: , Rfl:  .  [START ON 12/21/2018] Oxycodone HCl 10 MG TABS, Take 0.5-1 tablets (5-10 mg total) by mouth 2 (two) times daily as needed for up to 30 days. Must last 30 days. MAX.: 2/day, Disp: 60 tablet, Rfl: 0 .  potassium chloride SA (K-DUR,KLOR-CON) 20 MEQ tablet, Take 1 tablet (20 mEq total) by mouth daily. And additional tablet when taking metolazone, Disp: 45 tablet, Rfl: 5 .  promethazine (PHENERGAN) 12.5 MG tablet, Take 1 tablet (12.5 mg total) by mouth every 6 (six) hours as needed for nausea or vomiting., Disp: 30 tablet, Rfl: 0 .  rivaroxaban (XARELTO) 20 MG TABS tablet, Take 20 mg by mouth every evening. , Disp: , Rfl:  .  tiZANidine (ZANAFLEX) 4 MG tablet, Take 4 mg by mouth at bedtime. , Disp: , Rfl:  .  butalbital-acetaminophen-caffeine (FIORICET, ESGIC) 50-325-40 MG tablet, Take 1 tablet by mouth as needed., Disp: , Rfl:  .  topiramate (TOPAMAX) 25 MG tablet, Take 25 mg by mouth 2 (two) times daily. , Disp: , Rfl:   ROS  Constitutional: Denies any fever or chills Gastrointestinal: No reported hemesis, hematochezia, vomiting, or acute GI distress Musculoskeletal: Denies any acute onset joint swelling, redness, loss of  ROM, or weakness Neurological: No reported episodes of acute onset apraxia, aphasia, dysarthria, agnosia, amnesia, paralysis, loss of coordination, or loss of consciousness  Allergies  Ms. Lovering is allergic to amiodarone; aspirin; flexeril [cyclobenzaprine]; trazamine [trazodone & diet manage prod]; codeine; and tramadol.  [place]  Drug: Ms. Specht  reports previous drug use. Alcohol:  reports previous alcohol use of about 3.0 standard drinks of alcohol per week. Tobacco:  reports that she has been smoking cigarettes. She has been smoking about 0.50 packs per day. She has never used smokeless tobacco. Medical:  has a past medical history of Allergy, Anxiety, Arthritis, Asthma, CHF (congestive heart failure) ([place]), COPD (chronic obstructive pulmonary disease) ([place]), Coronary artery disease, Fibromyalgia, GERD (gastroesophageal reflux disease), Hypertension, Pneumonia, PUD (peptic ulcer disease), Pulmonary HTN ([place]), Rheumatic fever/heart disease, and Sleep apnea. Surgical: Ms. Saur  has a past surgical history that includes Tonsillectomy; Adenoidectomy; Multiple tooth extractions; Total knee arthroplasty (Right, 09/04/2016); Esophagogastroduodenoscopy (egd) with propofol (N/A, 05/25/2018); and Mitral valve replacement. Family: family history includes Asthma in her mother; COPD in her mother; Cancer in her mother; Congestive Heart Failure in her father.  Constitutional Exam  General appearance: Well nourished, well developed, and well hydrated. In no apparent acute distress Vitals:   12/19/18 1317  BP: 101/77  Pulse: 91  Resp: 16  Temp: 98.1 F (36.7 C)  TempSrc: Oral  SpO2: 100%  Weight: 273 lb (123.8 kg)  Height: 5' 4" (1.626 m)   BMI Assessment: Estimated body mass index is 46.86 kg/m as calculated from the following:   Height as of this encounter: 5' 4" (1.626 m).   Weight as of this encounter: 273 lb (123.8 kg).  BMI interpretation table: BMI level Category Range association  with higher incidence of chronic pain  <18 kg/m2 Underweight   18.5-24.9 kg/m2 Ideal body weight   25-29.9 kg/m2 Overweight Increased incidence by 20%  30-34.9 kg/m2 Obese (Class I) Increased incidence by 68%  35-39.9 kg/m2 Severe obesity (Class II) Increased incidence by 136%  >40 kg/m2 Extreme obesity (Class III) Increased incidence by 254%   Patient's current BMI Ideal Body weight  Body mass index is 46.86 kg/m. Ideal body weight: 54.7 kg (120 lb 9.5 oz) Adjusted ideal body weight: 82.4 kg (181 lb 8.9 oz)   BMI Readings from Last 4 Encounters:  12/19/18 46.86 kg/m  12/12/18 46.09 kg/m  12/08/18 47.55 kg/m  12/06/18 46.91 kg/m   Wt Readings from Last 4 Encounters:  12/19/18 273 lb (123.8 kg)  12/12/18 268 lb 8 oz (121.8 kg)  12/08/18 277 lb (125.6 kg)  12/06/18 273 lb 4.8 oz (124 kg)  Psych/Mental status: Alert, oriented x 3 (person, place, & time)       Eyes: PERLA Respiratory: No evidence of acute respiratory distress  Cervical Spine Area Exam  Skin & Axial Inspection: No masses, redness, edema, swelling, or associated skin lesions Alignment: Symmetrical Functional ROM: Unrestricted ROM      Stability: No instability detected Muscle Tone/Strength: Functionally intact. No obvious neuro-muscular anomalies detected. Sensory (Neurological): Unimpaired Palpation: No palpable anomalies  Upper Extremity (UE) Exam    Side: Right upper extremity  Side: Left upper extremity  Skin & Extremity Inspection: Skin color, temperature, and hair growth are WNL. No peripheral edema or cyanosis. No masses, redness, swelling, asymmetry, or associated skin lesions. No contractures.  Skin & Extremity Inspection: Skin color, temperature, and hair growth are WNL. No peripheral edema or cyanosis. No masses, redness, swelling, asymmetry, or associated skin lesions. No contractures.  Functional ROM: Unrestricted ROM          Functional ROM: Unrestricted ROM          Muscle  Tone/Strength: Functionally intact. No obvious neuro-muscular anomalies detected.  Muscle Tone/Strength: Functionally intact. No obvious neuro-muscular anomalies detected.  Sensory (Neurological): Unimpaired          Sensory (Neurological): Unimpaired          Palpation: No palpable anomalies              Palpation: No palpable anomalies              Provocative Test(s):  Phalen's test: deferred Tinel's test: deferred Apley's scratch test (touch opposite shoulder):  Action 1 (Across chest): deferred Action 2 (Overhead): deferred Action 3 (LB reach): deferred   Provocative Test(s):  Phalen's test: deferred Tinel's test: deferred Apley's scratch test (touch opposite shoulder):  Action 1 (Across chest): deferred Action 2 (Overhead): deferred Action 3 (LB reach): deferred    Thoracic Spine Area Exam  Skin & Axial Inspection: No masses, redness, or swelling Alignment: Symmetrical Functional ROM: Unrestricted ROM Stability: No instability detected Muscle Tone/Strength: Functionally intact. No obvious neuro-muscular anomalies detected. Sensory (Neurological): Unimpaired Muscle strength & Tone: No palpable anomalies  Lumbar Spine Area Exam  Skin & Axial Inspection: No masses, redness, or swelling Alignment: Symmetrical Functional ROM: Decreased ROM affecting both sides Stability: No instability detected Muscle Tone/Strength: Increased muscle tone over affected area Sensory (Neurological): Movement-associated pain Palpation: Complains of area being tender to palpation       Provocative Tests: Hyperextension/rotation test: (+) bilaterally for facet joint pain. Lumbar quadrant test (Kemp's test): deferred today       Lateral bending test: deferred today       Patrick's Maneuver: deferred today                   FABER* test: deferred today                   S-I anterior distraction/compression test: deferred today         S-I lateral compression test: deferred today         S-I  Thigh-thrust test: deferred today         S-I Gaenslen's test: deferred today         *(Flexion, ABduction and External Rotation)  Gait & Posture Assessment  Ambulation: Unassisted Gait: Antalgic Posture: Difficulty standing up straight, due to pain   Lower Extremity Exam    Side: Right lower extremity  Side: Left lower extremity  Stability: No instability observed          Stability: No instability observed          Skin & Extremity Inspection: Skin color, temperature, and hair growth are WNL. No peripheral edema or cyanosis. No masses, redness, swelling, asymmetry, or associated skin lesions. No contractures.  Skin & Extremity Inspection: Skin color, temperature, and hair growth are WNL. No peripheral edema or cyanosis. No masses, redness, swelling, asymmetry, or associated skin lesions. No contractures.  Functional ROM: Unrestricted ROM                  Functional ROM: Unrestricted ROM                  Muscle Tone/Strength: Functionally intact. No obvious neuro-muscular anomalies detected.  Muscle Tone/Strength: Functionally intact. No obvious neuro-muscular anomalies detected.  Sensory (Neurological): Unimpaired        Sensory (Neurological): Unimpaired        DTR: Patellar: deferred today Achilles: deferred today Plantar: deferred today  DTR: Patellar: deferred today Achilles: deferred today Plantar: deferred today  Palpation: No palpable anomalies  Palpation: No palpable anomalies   Assessment  Primary Diagnosis & Pertinent Problem List: The primary encounter diagnosis was Chronic low back pain (Primary Area of Pain) (Bilateral) w/ sciatica (Left). Diagnoses of Chronic lower extremity pain (Secondary Area of Pain) (Left), Chronic sacroiliac joint pain, Sternal pain (Tertiary Area of Pain), Osteoarthritis of knee (Right), Fibromyalgia (Fourth Area of Pain), Chronic knee pain (Fifth Area of Pain) (Right), Chronic pain syndrome, Pharmacologic therapy, Chronic anticoagulation  (Xarelto), Class 3 severe obesity due to excess calories with serious comorbidity and body mass index (BMI) of 45.0 to 49.9 in adult [facility]), Elevated sed rate, Elevated C-reactive protein, Vitamin D deficiency, and Disorder of skeletal system were also pertinent to this visit.  Status Diagnosis  Unimproved Unimproved Unimproved 1. Chronic low back pain (Primary Area of Pain) (Bilateral) w/ sciatica (Left)   2. Chronic lower extremity pain (Secondary Area of Pain) (Left)   3. Chronic sacroiliac joint pain   4. Sternal pain (Tertiary Area of Pain)   5. Osteoarthritis of knee (Right)   6. Fibromyalgia (Fourth Area of Pain)   7. Chronic knee pain (Fifth Area of Pain) (Right)   8. Chronic pain syndrome   9. Pharmacologic therapy   10. Chronic anticoagulation (Xarelto)   11. Class 3 severe obesity due to excess calories with serious comorbidity and body mass index (BMI) of 45.0 to 49.9 in adult ([place])   12. Elevated sed rate   13. Elevated C-reactive protein   14. Vitamin D deficiency   15. Disorder of skeletal system     Problems updated and reviewed during this visit: Problem  Numbness and Tingling of Both Feet  Osteoarthritis of knee (Right)  Elevated Sed Rate  Elevated C-Reactive Protein  Diplopia  Dizziness  Headache, Chronic Daily  Chf (Congestive Heart Failure) (Hcc)  Cap (Community Acquired Pneumonia)   Plan of Care  Pharmacotherapy (Medications Ordered): Meds ordered this encounter  Medications  . Oxycodone HCl 10 MG TABS    Sig: Take 0.5-1 tablets (5-10 mg total) by mouth 2 (two) times daily as needed for up to 30 days. Must last 30 days. MAX.: 2/day    Dispense:  60 tablet    Refill:  0    [place] STOP ACT - Not applicable. Fill one day early if pharmacy is closed on scheduled refill date. Do not fill until: 12/21/18. Must last 30 days. To last until: 01/20/19.   Medications administered today: Cozette C. Rudie had no medications administered during this  visit.   Procedure Orders     LUMBAR FACET(MEDIAL BRANCH NERVE BLOCK) MBNB  Lab Orders     ToxASSURE Select 13 (MW), Urine     25-Hydroxyvitamin D Lcms D2+D3     Rheumatoid factor     ANA w/Reflex if Positive Imaging Orders  No imaging studies ordered today   Referral Orders  No referral(s)  requested today   Interventional management options: Planned, scheduled, and/or pending:   NOTE: XARELTO Anticoagulation. (Stop: x 3 days prior to procedures. Re-start: 6 hours after) (NO STEROIDS - CHF)  Diagnostic bilateral lumbar facet block, w/o steroids #1 under fluoroscopic guidance and IV sedation.  Stop Xarelto 3 days prior to procedure.   Considering:   Diagnostic bilateral lumbar facet nerve blocks  Diagnostic bilateral lumbar facet radiofrequency ablation  Trigger point injections  Diagnostic right knee genicular nerve block  Possible right knee genicular nerve radiofrequency ablation    Palliative PRN treatment(s):   None at this time   Provider-requested follow-up: Return for Procedure (w/ sedation): (B) L-FCT BLK #1, (Blood-thinner Protocol).  Future Appointments  Date Time Provider [place]  12/27/2018 12:45 PM Milinda Pointer, MD [facility] None  01/09/2019 10:00 AM Alisa Graff, FNP [facility] None   Primary Care Physician: Kathee Delton, MD Location: [facility] Note by: Gaspar Cola, MD Date: 12/19/2018; Time: 3:09 PM

## 2018-12-20 ENCOUNTER — Telehealth: Payer: Self-pay | Admitting: *Deleted

## 2018-12-20 ENCOUNTER — Encounter: Payer: Self-pay | Admitting: *Deleted

## 2018-12-20 DIAGNOSIS — Z952 Presence of prosthetic heart valve: Secondary | ICD-10-CM

## 2018-12-20 NOTE — Progress Notes (Signed)
Discharge Progress Report  Patient Details  Name: Chelsea Ramsey MRN: [id number] Date of Birth: [birth year] Referring Provider:     Cardiac Rehab from 08/25/2018 in [facility]  Referring Provider  [name]       Number of Visits: 10/36  Reason for Discharge:  Early Exit:  Lack of attendance and Readmissions  Smoking History:  Social History   Tobacco Use  Smoking Status Current Every Day Smoker  . Packs/day: 0.50  . Types: Cigarettes  Smokeless Tobacco Never Used    Diagnosis:  S/P mitral valve replacement  ADL UCSD:   Initial Exercise Prescription: Initial Exercise Prescription - 08/25/18 1500      Date of Initial Exercise RX and Referring Provider   Date  08/25/18    Referring Provider  [name]      Oxygen   Oxygen  Continuous    Liters  2      Treadmill   MPH  1.8    Grade  1    Minutes  15    METs  2.63      NuStep   Level  2    SPM  80    Minutes  15    METs  2.6      Biostep-RELP   Level  2    SPM  50    Minutes  15    METs  2      Prescription Details   Frequency (times per week)  2    Duration  Progress to 30 minutes of continuous aerobic without signs/symptoms of physical distress      Intensity   THRR 40-80% of Max Heartrate  128-155    Ratings of Perceived Exertion  11-13    Perceived Dyspnea  0-4      Resistance Training   Training Prescription  Yes    Weight  3 lb    Reps  10-15       Discharge Exercise Prescription (Final Exercise Prescription Changes): Exercise Prescription Changes - 10/11/18 1500      Response to Exercise   Blood Pressure (Admit)  126/70    Blood Pressure (Exercise)  148/80    Heart Rate (Admit)  86 bpm    Heart Rate (Exercise)  119 bpm    Heart Rate (Exit)  90 bpm    Rating of Perceived Exertion (Exercise)  13    Symptoms  CP, dizziness    Duration  Continue with 30 min of aerobic exercise without signs/symptoms of physical distress.    Intensity  THRR unchanged       Progression   Progression  Continue to progress workloads to maintain intensity without signs/symptoms of physical distress.      Resistance Training   Training Prescription  Yes    Weight  3 lbs    Reps  10-15      Interval Training   Interval Training  No      Treadmill   MPH  2.3    Grade  1    Minutes  12      Home Exercise Plan   Plans to continue exercise at  Home (comment)   walk at park   Frequency  Add 3 additional days to program exercise sessions.    Initial Home Exercises Provided  09/27/18       Functional Capacity: 6 Minute Walk    Row Name 08/25/18 1500         6 Minute Walk  Phase  Initial     Distance  965 feet     Walk Time  6 minutes     # of Rest Breaks  0     MPH  1.83     METS  2.7     RPE  11     Perceived Dyspnea   2     VO2 Peak  9.46     Symptoms  Yes (comment)     Comments  back pain 6/10 / pulled 02 tank on 2 L     Resting HR  102 bpm     Resting BP  126/90     Resting Oxygen Saturation   98 %     Exercise Oxygen Saturation  during 6 min walk  93 %     Max Ex. HR  110 bpm     Max Ex. BP  136/84     2 Minute Post BP  116/84       Interval Oxygen   Interval Oxygen?  Yes     1 Minute Oxygen Saturation %  93 %     1 Minute Liters of Oxygen  2 L     2 Minute Oxygen Saturation %  95 %     2 Minute Liters of Oxygen  2 L     3 Minute Liters of Oxygen  2 L     4 Minute Oxygen Saturation %  95 %     4 Minute Liters of Oxygen  2 L     5 Minute Liters of Oxygen  2 L     6 Minute Oxygen Saturation %  97 %     6 Minute Liters of Oxygen  2 L     2 Minute Post Oxygen Saturation %  97 %     2 Minute Post Liters of Oxygen  2 L        Psychological, QOL, Others - Outcomes: PHQ 2/9: Depression screen [facility] 2/9 11/14/2018 08/25/2018 07/26/2018  Decreased Interest 0 1 0  Down, Depressed, Hopeless 0 0 0  PHQ - 2 Score 0 1 0  Altered sleeping - 2 -  Tired, decreased energy - 2 -  Change in appetite - 1 -  Feeling bad or failure about  yourself  - 0 -  Trouble concentrating - 2 -  Moving slowly or fidgety/restless - 0 -  Suicidal thoughts - 0 -  PHQ-9 Score - 8 -  Difficult doing work/chores - Not difficult at all -    Quality of Life: Quality of Life - 08/25/18 1545      Quality of Life   Select  Quality of Life      Quality of Life Scores   Health/Function Pre  16.4 %    Socioeconomic Pre  22.29 %    Psych/Spiritual Pre  18 %    Family Pre  27 %    GLOBAL Pre  19.27 %       Personal Goals: Goals established at orientation with interventions provided to work toward goal. Personal Goals and Risk Factors at Admission - 08/25/18 1539      Core Components/Risk Factors/Patient Goals on Admission    Weight Management  Yes;Obesity;Weight Loss    Intervention  Weight Management: Develop a combined nutrition and exercise program designed to reach desired caloric intake, while maintaining appropriate intake of nutrient and fiber, sodium and fats, and appropriate energy expenditure required for the weight goal.;Weight Management: Provide education  and appropriate resources to help participant work on and attain dietary goals.;Weight Management/Obesity: Establish reasonable short term and long term weight goals.;Obesity: Provide education and appropriate resources to help participant work on and attain dietary goals.    Admit Weight  258 lb 9.6 oz (117.3 kg)    Goal Weight: Short Term  253 lb (114.8 kg)    Goal Weight: Long Term  200 lb (90.7 kg)    Expected Outcomes  Short Term: Continue to assess and modify interventions until short term weight is achieved;Long Term: Adherence to nutrition and physical activity/exercise program aimed toward attainment of established weight goal;Weight Loss: Understanding of general recommendations for a balanced deficit meal plan, which promotes 1-2 lb weight loss per week and includes a negative energy balance of [phone] kcal/d;Understanding recommendations for meals to include 15-35%  energy as protein, 25-35% energy from fat, 35-60% energy from carbohydrates, less than 200mg  of dietary cholesterol, 20-35 gm of total fiber daily;Understanding of distribution of calorie intake throughout the day with the consumption of 4-5 meals/snacks    Tobacco Cessation  Yes    Intervention  Assist the participant in steps to quit. Provide individualized education and counseling about committing to Tobacco Cessation, relapse prevention, and pharmacological support that can be provided by physician.;Advice worker, assist with locating and accessing local/national Quit Smoking programs, and support quit date choice.    Expected Outcomes  Short Term: Will demonstrate readiness to quit, by selecting a quit date.;Short Term: Will quit all tobacco product use, adhering to prevention of relapse plan.;Long Term: Complete abstinence from all tobacco products for at least 12 months from quit date.    Heart Failure  Yes    Intervention  Provide a combined exercise and nutrition program that is supplemented with education, support and counseling about heart failure. Directed toward relieving symptoms such as shortness of breath, decreased exercise tolerance, and extremity edema.    Expected Outcomes  Improve functional capacity of life;Short term: Attendance in program 2-3 days a week with increased exercise capacity. Reported lower sodium intake. Reported increased fruit and vegetable intake. Reports medication compliance.;Short term: Daily weights obtained and reported for increase. Utilizing diuretic protocols set by physician.;Long term: Adoption of self-care skills and reduction of barriers for early signs and symptoms recognition and intervention leading to self-care maintenance.    Hypertension  Yes    Intervention  Provide education on lifestyle modifcations including regular physical activity/exercise, weight management, moderate sodium restriction and increased consumption of fresh fruit,  vegetables, and low fat dairy, alcohol moderation, and smoking cessation.;Monitor prescription use compliance.    Expected Outcomes  Short Term: Continued assessment and intervention until BP is < 140/40mm HG in hypertensive participants. < 130/70mm HG in hypertensive participants with diabetes, heart failure or chronic kidney disease.;Long Term: Maintenance of blood pressure at goal levels.        Personal Goals Discharge: Goals and Risk Factor Review    Row Name 09/27/18 1003             Core Components/Risk Factors/Patient Goals Review   Personal Goals Review  Heart Failure;Weight Management/Obesity;Hypertension;Improve shortness of breath with ADL's       Review  Chelsea Ramsey is off to a good start in rehab. She has already started to lose some weight and is down to 156lbs.  She has cut back on soda and candy bars.  She weighs daily and checks blood pressure and saturations daily as well!!  She has been having some shortness of breath  and is using her oxygen at night and occasionally during the day. She still has some incisional pain but knows that she is getting better.  Still within her recovery window.  She is doing well with her medicaitons.        Expected Outcomes  Short: Continue to weigh daily and work weight loss.  Long: Continue to manage heart failure.           Exercise Goals and Review: Exercise Goals    Row Name 08/25/18 1500             Exercise Goals   Increase Physical Activity  Yes       Intervention  Provide advice, education, support and counseling about physical activity/exercise needs.;Develop an individualized exercise prescription for aerobic and resistive training based on initial evaluation findings, risk stratification, comorbidities and participant's personal goals.       Expected Outcomes  Short Term: Attend rehab on a regular basis to increase amount of physical activity.;Long Term: Add in home exercise to make exercise part of routine and to increase amount of  physical activity.;Long Term: Exercising regularly at least 3-5 days a week.       Increase Strength and Stamina  Yes       Intervention  Provide advice, education, support and counseling about physical activity/exercise needs.;Develop an individualized exercise prescription for aerobic and resistive training based on initial evaluation findings, risk stratification, comorbidities and participant's personal goals.       Expected Outcomes  Short Term: Increase workloads from initial exercise prescription for resistance, speed, and METs.;Short Term: Perform resistance training exercises routinely during rehab and add in resistance training at home;Long Term: Improve cardiorespiratory fitness, muscular endurance and strength as measured by increased METs and functional capacity (6MWT)       Able to understand and use rate of perceived exertion (RPE) scale  Yes       Intervention  Provide education and explanation on how to use RPE scale       Expected Outcomes  Short Term: Able to use RPE daily in rehab to express subjective intensity level;Long Term:  Able to use RPE to guide intensity level when exercising independently       Able to understand and use Dyspnea scale  Yes       Intervention  Provide education and explanation on how to use Dyspnea scale       Expected Outcomes  Short Term: Able to use Dyspnea scale daily in rehab to express subjective sense of shortness of breath during exertion;Long Term: Able to use Dyspnea scale to guide intensity level when exercising independently       Knowledge and understanding of Target Heart Rate Range (THRR)  Yes       Intervention  Provide education and explanation of THRR including how the numbers were predicted and where they are located for reference       Expected Outcomes  Short Term: Able to state/look up THRR;Short Term: Able to use daily as guideline for intensity in rehab;Long Term: Able to use THRR to govern intensity when exercising independently        Able to check pulse independently  Yes       Intervention  Provide education and demonstration on how to check pulse in carotid and radial arteries.;Review the importance of being able to check your own pulse for safety during independent exercise       Expected Outcomes  Short Term: Able to explain why  pulse checking is important during independent exercise;Long Term: Able to check pulse independently and accurately       Understanding of Exercise Prescription  Yes       Intervention  Provide education, explanation, and written materials on patient's individual exercise prescription       Expected Outcomes  Short Term: Able to explain program exercise prescription;Long Term: Able to explain home exercise prescription to exercise independently          Exercise Goals Re-Evaluation: Exercise Goals Re-Evaluation    Row Name 09/08/18 0911 09/22/18 1017 09/27/18 1001 10/11/18 1549 10/24/18 1610     Exercise Goal Re-Evaluation   Exercise Goals Review  Increase Physical Activity;Increase Strength and Stamina;Able to understand and use rate of perceived exertion (RPE) scale;Knowledge and understanding of Target Heart Rate Range (THRR);Understanding of Exercise Prescription  -  Increase Physical Activity;Increase Strength and Stamina;Understanding of Exercise Prescription  Increase Physical Activity;Increase Strength and Stamina;Understanding of Exercise Prescription  -   Comments  Reviewed RPE scale, THR and program prescription with pt today.  Pt voiced understanding and was given a copy of goals to take home.   -  Chelsea Ramsey is off to a good start in rehab.  She has already started to notice that her strength and stamina are starting to come back. She is already walking daily in the morning.  She continues to have some back pain, but knows strengthening the muscles will help. She also has some sciattica pain in back and legs.  Reviewed home exercise with pt today.  Pt plans to walk at park for exercise.   Reviewed THR, pulse, RPE, sign and symptoms, and when to call 911 or MD.  Also discussed weather considerations and indoor options.  Pt voiced understanding.  Chelsea Ramsey has been doing well in rehab. She has only attended once since last review.  Today, she had a spell on the treadmill with dizziness and chest pain.  She was sent to the ED for further work up.  Troponins were negative.   Once she is able to return we will continue to work on her progress.   out since last review   Expected Outcomes  Short: Use RPE daily to regulate intensity. Long: Follow program prescription in THR.  -  Short: Continue to walk daily.  Long: Continue to attend regularly and exercise more.   Short: Cleared to return to rehab.  Long: Continue to improve strength and stamina.   -   Row Name 11/25/18 0942 12/06/18 1421           Exercise Goal Re-Evaluation   Comments  out since last review  out since last review         Nutrition & Weight - Outcomes: Pre Biometrics - 08/25/18 1459      Pre Biometrics   Height  5' 6.5" (1.689 m)    Weight  258 lb 9.6 oz (117.3 kg)    Waist Circumference  47 inches    Hip Circumference  54 inches    Waist to Hip Ratio  0.87 %    BMI (Calculated)  41.12    Single Leg Stand  30 seconds        Nutrition: Nutrition Therapy & Goals - 09/22/18 1133      Nutrition Therapy   Diet  TLC    Protein (specify units)  8oz    Fiber  20 grams    Whole Grain Foods  3 servings   does not currently choose  whole grains   Saturated Fats  14 max. grams    Fruits and Vegetables  5 servings/day   8 Ideal; does not regularly eat fruits and vegetables   Sodium  1500 grams      Personal Nutrition Goals   Nutrition Goal  Work to decrease soda and candy consumption. The best solution would be not to buy these items in the first place to reduce temptation, but you can also start by setting goals such as drinking one less glass of soda and more more glass of water per day, or choosing  applesauce/pudding rather than a candy bar as a snack    Personal Goal #2  Increase fruit and vegetable intake by at least one additional serving per week. Long term goal would be to eat multiple servings of fruits and vegetables daily    Personal Goal #3  Rather than 'nibbling' on foods throughout the day, work to create a structured eating plan that includes meals/snacks with at least two food groups. This will help improve fullness and satiety when you eat and decrease cravings    Comments  Pt reports wt regain s/p recent surgery which she suspects is due to increase in snacking, which has taken the place of smoking (trying to quit). She is choosing candy and chips which has lead to her 'craving' these items more now as well. She has also been snacking/ "nibbling" on foods throughout the day rather than having sit-down meals. She has however been trying to reduce sodium intake and no longer adds salt to meals. She does not to out to eat often. For beverage she does drink water but also has been drinking juice regularly and finishes a 3L of soda every two days      Intervention Plan   Intervention  Prescribe, educate and counsel regarding individualized specific dietary modifications aiming towards targeted core components such as weight, hypertension, lipid management, diabetes, heart failure and other comorbidities.    Expected Outcomes  Short Term Goal: Understand basic principles of dietary content, such as calories, fat, sodium, cholesterol and nutrients.;Short Term Goal: A plan has been developed with personal nutrition goals set during dietitian appointment.;Long Term Goal: Adherence to prescribed nutrition plan.       Nutrition Discharge: Nutrition Assessments - 08/25/18 1549      MEDFICTS Scores   Pre Score  125       Education Questionnaire Score: Knowledge Questionnaire Score - 08/25/18 1541      Knowledge Questionnaire Score   Pre Score  19/26   correct answers reviewed with  [facility], focus on exercise, nutrition, and MI      Goals reviewed with patient; copy given to patient.

## 2018-12-20 NOTE — Telephone Encounter (Signed)
Called to check on Chelsea Ramsey.  She has been out since 11/12.  She continues to have on going issues and still needs clearance to return.  We will discharge her at this time with the hope that once her health recovers she will be able to return to finish the program.

## 2018-12-20 NOTE — Progress Notes (Signed)
Cardiac Individual Treatment Plan  Patient Details  Name: Chelsea Ramsey MRN: [id number] Date of Birth: [birth year] Referring Provider:     Cardiac Rehab from 08/25/2018 in [facility] Cardiac and Pulmonary Rehab  Referring Provider  [name]      Initial Encounter Date:    Cardiac Rehab from 08/25/2018 in [facility] Cardiac and Pulmonary Rehab  Date  08/25/18      Visit Diagnosis: S/P mitral valve replacement  Patient's Home Medications on Admission:  Current Outpatient Medications:  .  ALPRAZolam (XANAX) 0.25 MG tablet, Take 1 tablet (0.25 mg total) by mouth daily as needed for anxiety., Disp: 7 tablet, Rfl: 0 .  bisacodyl (DULCOLAX) 5 MG EC tablet, Take 2 tablets (10 mg total) by mouth daily as needed for moderate constipation., Disp: 30 tablet, Rfl: 0 .  budesonide-formoterol (SYMBICORT) 160-4.5 MCG/ACT inhaler, Inhale 2 puffs into the lungs 2 (two) times daily., Disp: , Rfl:  .  butalbital-acetaminophen-caffeine (FIORICET, ESGIC) 50-325-40 MG tablet, Take 1 tablet by mouth as needed., Disp: , Rfl:  .  Calcium Carbonate-Vit D-Min (GNP CALCIUM 1200) 1200-1000 MG-UNIT CHEW, Chew 1,200 mg by mouth daily with breakfast. Take in combination with vitamin D and magnesium., Disp: 30 tablet, Rfl: 5 .  Cholecalciferol (VITAMIN D3) 125 MCG (5000 UT) CAPS, Take 1 capsule (5,000 Units total) by mouth daily with breakfast. Take along with calcium and magnesium., Disp: 30 capsule, Rfl: 5 .  ergocalciferol (VITAMIN D2) 1.25 MG (50000 UT) capsule, Take 1 capsule (50,000 Units total) by mouth 2 (two) times a week. X 6 weeks., Disp: 12 capsule, Rfl: 0 .  furosemide (LASIX) 40 MG tablet, Take 1 tablet (40 mg total) by mouth 2 (two) times daily., Disp: 60 tablet, Rfl: 11 .  gabapentin (NEURONTIN) 600 MG tablet, Take 0.5 tablets (300 mg total) by mouth 2 (two) times daily. (Patient taking differently: Take 600 mg by mouth 3 (three) times daily. ), Disp: 60 tablet, Rfl: 0 .  guaiFENesin (MUCINEX) 600 MG 12 hr tablet,  Take 1 tablet (600 mg total) by mouth 2 (two) times daily., Disp: 20 tablet, Rfl: 0 .  ipratropium-albuterol (DUONEB) 0.5-2.5 (3) MG/3ML SOLN, Inhale 3 mLs into the lungs 3 (three) times daily as needed (respiratory). , Disp: , Rfl:  .  lactulose (CHRONULAC) 10 GM/15ML solution, Take 45 mLs (30 g total) by mouth 2 (two) times daily as needed for mild constipation., Disp: 240 mL, Rfl: 0 .  magnesium oxide (MAG-OX) 400 MG tablet, Take 400 mg by mouth 2 (two) times daily., Disp: , Rfl:  .  meclizine (ANTIVERT) 12.5 MG tablet, Take 25 mg by mouth 3 (three) times daily as needed for dizziness., Disp: , Rfl:  .  metolazone (ZAROXOLYN) 2.5 MG tablet, Take 1 tablet (2.5 mg total) by mouth daily., Disp: 10 tablet, Rfl: 0 .  metoprolol tartrate (LOPRESSOR) 100 MG tablet, Take 1 tablet (100 mg total) by mouth 2 (two) times daily., Disp: 60 tablet, Rfl: 0 .  nicotine (NICODERM CQ - DOSED IN MG/24 HR) 7 mg/24hr patch, Place 1 patch (7 mg total) onto the skin daily., Disp: 28 patch, Rfl: 0 .  nitroGLYCERIN (NITROSTAT) 0.4 MG SL tablet, Place 1 tablet (0.4 mg total) under the tongue every 5 (five) minutes as needed for chest pain., Disp: 30 tablet, Rfl: 12 .  nortriptyline (PAMELOR) 10 MG capsule, Take 10 mg by mouth Nightly., Disp: , Rfl:  .  omeprazole (PRILOSEC) 40 MG capsule, Take 40 mg by mouth 2 (two) times daily., Disp: ,  Rfl:  .  [START ON 12/21/2018] Oxycodone HCl 10 MG TABS, Take 0.5-1 tablets (5-10 mg total) by mouth 2 (two) times daily as needed for up to 30 days. Must last 30 days. MAX.: 2/day, Disp: 60 tablet, Rfl: 0 .  potassium chloride SA (K-DUR,KLOR-CON) 20 MEQ tablet, Take 1 tablet (20 mEq total) by mouth daily. And additional tablet when taking metolazone, Disp: 45 tablet, Rfl: 5 .  promethazine (PHENERGAN) 12.5 MG tablet, Take 1 tablet (12.5 mg total) by mouth every 6 (six) hours as needed for nausea or vomiting., Disp: 30 tablet, Rfl: 0 .  rivaroxaban (XARELTO) 20 MG TABS tablet, Take 20 mg by  mouth every evening. , Disp: , Rfl:  .  tiZANidine (ZANAFLEX) 4 MG tablet, Take 4 mg by mouth at bedtime. , Disp: , Rfl:  .  topiramate (TOPAMAX) 25 MG tablet, Take 25 mg by mouth 2 (two) times daily. , Disp: , Rfl:   Past Medical History: Past Medical History:  Diagnosis Date  . Allergy    seasonal  . Anxiety   . Arthritis    Right Knee  . Asthma   . CHF (congestive heart failure) ([place])   . COPD (chronic obstructive pulmonary disease) ([place])   . Coronary artery disease    Leaky heart valve  . Fibromyalgia   . GERD (gastroesophageal reflux disease)   . Hypertension   . Pneumonia   . PUD (peptic ulcer disease)   . Pulmonary HTN ([place])   . Rheumatic fever/heart disease   . Sleep apnea     Tobacco Use: Social History   Tobacco Use  Smoking Status Current Every Day Smoker  . Packs/day: 0.50  . Types: Cigarettes  Smokeless Tobacco Never Used    Labs: Recent Review Flowsheet Data    Labs for ITP Cardiac and Pulmonary Rehab Latest Ref Rng & Units 06/03/2012 12/09/2013 04/03/2014 08/21/2014 12/20/2017   Cholestrol 0 - 200 mg/dL - 91 96 - -   LDLCALC mg/dL - 50 51 - -   HDL 35 - 70 mg/dL - 31(L) 32(A) - -   Trlycerides 40 - 160 mg/dL - 50 63 - -   Hemoglobin A1c - 4.5 - - 5.2 -   PHART 7.350 - 7.450 - - - - 7.39   PCO2ART 32.0 - 48.0 mmHg - - - - 41   HCO3 20.0 - 28.0 mmol/L - - - - 24.8   ACIDBASEDEF 0.0 - 2.0 mmol/L - - - - 0.2   O2SAT % - - - - 98.4       Exercise Target Goals: Exercise Program Goal: Individual exercise prescription set using results from initial 6 min walk test and THRR while considering  patient's activity barriers and safety.   Exercise Prescription Goal: Initial exercise prescription builds to 30-45 minutes a day of aerobic activity, 2-3 days per week.  Home exercise guidelines will be given to patient during program as part of exercise prescription that the participant will acknowledge.  Activity Barriers & Risk Stratification: Activity Barriers &  Cardiac Risk Stratification - 08/25/18 1550      Activity Barriers & Cardiac Risk Stratification   Activity Barriers  Arthritis;Back Problems;Shortness of Breath;Joint Problems;Fibromyalgia    Cardiac Risk Stratification  Moderate       6 Minute Walk: 6 Minute Walk    Row Name 08/25/18 1500         6 Minute Walk   Phase  Initial     Distance  965 feet  Walk Time  6 minutes     # of Rest Breaks  0     MPH  1.83     METS  2.7     RPE  11     Perceived Dyspnea   2     VO2 Peak  9.46     Symptoms  Yes (comment)     Comments  back pain 6/10 / pulled 02 tank on 2 L     Resting HR  102 bpm     Resting BP  126/90     Resting Oxygen Saturation   98 %     Exercise Oxygen Saturation  during 6 min walk  93 %     Max Ex. HR  110 bpm     Max Ex. BP  136/84     2 Minute Post BP  116/84       Interval Oxygen   Interval Oxygen?  Yes     1 Minute Oxygen Saturation %  93 %     1 Minute Liters of Oxygen  2 L     2 Minute Oxygen Saturation %  95 %     2 Minute Liters of Oxygen  2 L     3 Minute Liters of Oxygen  2 L     4 Minute Oxygen Saturation %  95 %     4 Minute Liters of Oxygen  2 L     5 Minute Liters of Oxygen  2 L     6 Minute Oxygen Saturation %  97 %     6 Minute Liters of Oxygen  2 L     2 Minute Post Oxygen Saturation %  97 %     2 Minute Post Liters of Oxygen  2 L        Oxygen Initial Assessment:   Oxygen Re-Evaluation: Oxygen Re-Evaluation    Row Name 09/27/18 1012             Program Oxygen Prescription   Program Oxygen Prescription  None         Home Oxygen   Home Oxygen Device  Portable Concentrator;Home Concentrator       Sleep Oxygen Prescription  Continuous       Liters per minute  3       Home Exercise Oxygen Prescription  None       Home at Rest Exercise Oxygen Prescription  None uses as needed on 2L       Compliance with Home Oxygen Use  Yes         Goals/Expected Outcomes   Short Term Goals  To learn and exhibit compliance with  exercise, home and travel O2 prescription;To learn and understand importance of monitoring SPO2 with Ramsey oximeter and demonstrate accurate use of the Ramsey oximeter.;To learn and understand importance of maintaining oxygen saturations>88%;To learn and demonstrate proper pursed lip breathing techniques or other breathing techniques.;To learn and demonstrate proper use of respiratory medications       Long  Term Goals  Exhibits compliance with exercise, home and travel O2 prescription;Verbalizes importance of monitoring SPO2 with Ramsey oximeter and return demonstration;Maintenance of O2 saturations>88%;Exhibits proper breathing techniques, such as pursed lip breathing or other method taught during program session;Compliance with respiratory medication       Comments  Chelsea Ramsey is doing well in rehab.  She is doing well with her nebulaizers and inhalers.  She monitors her sats daily with Ramsey oximeter.  We  talked about using PLB regularly. She is compliant with her oxygen at night and only uses it during the day if she feels SOB.       Goals/Expected Outcomes  Short: Continue to monitor saturations and wean off day time use of oxygen.  Long: Continue monitor pulmonary disease.           Oxygen Discharge (Final Oxygen Re-Evaluation): Oxygen Re-Evaluation - 09/27/18 1012      Program Oxygen Prescription   Program Oxygen Prescription  None      Home Oxygen   Home Oxygen Device  Portable Concentrator;Home Concentrator    Sleep Oxygen Prescription  Continuous    Liters per minute  3    Home Exercise Oxygen Prescription  None    Home at Rest Exercise Oxygen Prescription  None   uses as needed on 2L   Compliance with Home Oxygen Use  Yes      Goals/Expected Outcomes   Short Term Goals  To learn and exhibit compliance with exercise, home and travel O2 prescription;To learn and understand importance of monitoring SPO2 with Ramsey oximeter and demonstrate accurate use of the Ramsey oximeter.;To learn and  understand importance of maintaining oxygen saturations>88%;To learn and demonstrate proper pursed lip breathing techniques or other breathing techniques.;To learn and demonstrate proper use of respiratory medications    Long  Term Goals  Exhibits compliance with exercise, home and travel O2 prescription;Verbalizes importance of monitoring SPO2 with Ramsey oximeter and return demonstration;Maintenance of O2 saturations>88%;Exhibits proper breathing techniques, such as pursed lip breathing or other method taught during program session;Compliance with respiratory medication    Comments  Chelsea Ramsey is doing well in rehab.  She is doing well with her nebulaizers and inhalers.  She monitors her sats daily with Ramsey oximeter.  We talked about using PLB regularly. She is compliant with her oxygen at night and only uses it during the day if she feels SOB.    Goals/Expected Outcomes  Short: Continue to monitor saturations and wean off day time use of oxygen.  Long: Continue monitor pulmonary disease.        Initial Exercise Prescription: Initial Exercise Prescription - 08/25/18 1500      Date of Initial Exercise RX and Referring Provider   Date  08/25/18    Referring Provider  [name]      Oxygen   Oxygen  Continuous    Liters  2      Treadmill   MPH  1.8    Grade  1    Minutes  15    METs  2.63      NuStep   Level  2    SPM  80    Minutes  15    METs  2.6      Biostep-RELP   Level  2    SPM  50    Minutes  15    METs  2      Prescription Details   Frequency (times per week)  2    Duration  Progress to 30 minutes of continuous aerobic without signs/symptoms of physical distress      Intensity   THRR 40-80% of Max Heartrate  128-155    Ratings of Perceived Exertion  11-13    Perceived Dyspnea  0-4      Resistance Training   Training Prescription  Yes    Weight  3 lb    Reps  10-15       Perform Capillary Blood Glucose  checks as needed.  Exercise Prescription Changes: Exercise  Prescription Changes    Row Name 08/25/18 1500 09/12/18 1600 09/27/18 1000 10/11/18 1500       Response to Exercise   Blood Pressure (Admit)  126/90  132/82  122/62  126/70    Blood Pressure (Exercise)  136/84  138/60  146/74  148/80    Blood Pressure (Exit)  116/84  122/78  122/62  -    Heart Rate (Admit)  102 bpm  95 bpm  71 bpm  86 bpm    Heart Rate (Exercise)  110 bpm  112 bpm  121 bpm  119 bpm    Heart Rate (Exit)  102 bpm  92 bpm  103 bpm  90 bpm    Oxygen Saturation (Admit)  96 %  -  -  -    Oxygen Saturation (Exercise)  93 %  95 %  -  -    Oxygen Saturation (Exit)  97 %  -  -  -    Rating of Perceived Exertion (Exercise)  _0 Perceived Dyspnea (Exercise)  2  -  -  -    Symptoms  back pain  none  none  CP, dizziness    Comments  gout L toe  first full day of exercise  -  -    Duration  Progress to 30 minutes of  aerobic without signs/symptoms of physical distress  Progress to 30 minutes of  aerobic without signs/symptoms of physical distress  Continue with 30 min of aerobic exercise without signs/symptoms of physical distress.  Continue with 30 min of aerobic exercise without signs/symptoms of physical distress.    Intensity  -  THRR New  THRR unchanged  THRR unchanged      Progression   Progression  -  Continue to progress workloads to maintain intensity without signs/symptoms of physical distress.  Continue to progress workloads to maintain intensity without signs/symptoms of physical distress.  Continue to progress workloads to maintain intensity without signs/symptoms of physical distress.    Average METs  -  2.45  2.66  -      Resistance Training   Training Prescription  -  Yes  Yes  Yes    Weight  -  3 lb  3 lbs  3 lbs    Reps  -  10-15  10-15  10-15      Interval Training   Interval Training  -  No  No  No      Treadmill   MPH  -  -  2.3  2.3    Grade  -  -  1  1    Minutes  -  -  15  12    METs  -  -  3.08  -      NuStep   Level  -  2  2  -     Minutes  -  15  15  -    METs  -  2.9  2.9  -      Biostep-RELP   Level  -  2  8  -    Minutes  -  15  15  -    METs  -  2  2  -      Home Exercise Plan   Plans to continue exercise at  -  -  Home (comment) walk at park  Home (comment) walk  at park    Frequency  -  -  Add 3 additional days to program exercise sessions.  Add 3 additional days to program exercise sessions.    Initial Home Exercises Provided  -  -  09/27/18  09/27/18       Exercise Comments: Exercise Comments    Row Name 09/08/18 0910           Exercise Comments  First full day of exercise!  Patient was oriented to gym and equipment including functions, settings, policies, and procedures.  Patient's individual exercise prescription and treatment plan were reviewed.  All starting workloads were established based on the results of the 6 minute walk test done at initial orientation visit.  The plan for exercise progression was also introduced and progression will be customized based on patient's performance and goals.          Exercise Goals and Review: Exercise Goals    Row Name 08/25/18 1500             Exercise Goals   Increase Physical Activity  Yes       Intervention  Provide advice, education, support and counseling about physical activity/exercise needs.;Develop an individualized exercise prescription for aerobic and resistive training based on initial evaluation findings, risk stratification, comorbidities and participant's personal goals.       Expected Outcomes  Short Term: Attend rehab on a regular basis to increase amount of physical activity.;Long Term: Add in home exercise to make exercise part of routine and to increase amount of physical activity.;Long Term: Exercising regularly at least 3-5 days a week.       Increase Strength and Stamina  Yes       Intervention  Provide advice, education, support and counseling about physical activity/exercise needs.;Develop an individualized exercise prescription  for aerobic and resistive training based on initial evaluation findings, risk stratification, comorbidities and participant's personal goals.       Expected Outcomes  Short Term: Increase workloads from initial exercise prescription for resistance, speed, and METs.;Short Term: Perform resistance training exercises routinely during rehab and add in resistance training at home;Long Term: Improve cardiorespiratory fitness, muscular endurance and strength as measured by increased METs and functional capacity (6MWT)       Able to understand and use rate of perceived exertion (RPE) scale  Yes       Intervention  Provide education and explanation on how to use RPE scale       Expected Outcomes  Short Term: Able to use RPE daily in rehab to express subjective intensity level;Long Term:  Able to use RPE to guide intensity level when exercising independently       Able to understand and use Dyspnea scale  Yes       Intervention  Provide education and explanation on how to use Dyspnea scale       Expected Outcomes  Short Term: Able to use Dyspnea scale daily in rehab to express subjective sense of shortness of breath during exertion;Long Term: Able to use Dyspnea scale to guide intensity level when exercising independently       Knowledge and understanding of Target Heart Rate Range (THRR)  Yes       Intervention  Provide education and explanation of THRR including how the numbers were predicted and where they are located for reference       Expected Outcomes  Short Term: Able to state/look up THRR;Short Term: Able to use daily as guideline for intensity in rehab;Long  Term: Able to use THRR to govern intensity when exercising independently       Able to check Ramsey independently  Yes       Intervention  Provide education and demonstration on how to check Ramsey in carotid and radial arteries.;Review the importance of being able to check your own Ramsey for safety during independent exercise       Expected Outcomes   Short Term: Able to explain why Ramsey checking is important during independent exercise;Long Term: Able to check Ramsey independently and accurately       Understanding of Exercise Prescription  Yes       Intervention  Provide education, explanation, and written materials on patient's individual exercise prescription       Expected Outcomes  Short Term: Able to explain program exercise prescription;Long Term: Able to explain home exercise prescription to exercise independently          Exercise Goals Re-Evaluation : Exercise Goals Re-Evaluation    Row Name 09/08/18 0911 09/22/18 1017 09/27/18 1001 10/11/18 1549 10/24/18 1610     Exercise Goal Re-Evaluation   Exercise Goals Review  Increase Physical Activity;Increase Strength and Stamina;Able to understand and use rate of perceived exertion (RPE) scale;Knowledge and understanding of Target Heart Rate Range (THRR);Understanding of Exercise Prescription  -  Increase Physical Activity;Increase Strength and Stamina;Understanding of Exercise Prescription  Increase Physical Activity;Increase Strength and Stamina;Understanding of Exercise Prescription  -   Comments  Reviewed RPE scale, THR and program prescription with pt today.  Pt voiced understanding and was given a copy of goals to take home.   -  Chelsea Ramsey is off to a good start in rehab.  She has already started to notice that her strength and stamina are starting to come back. She is already walking daily in the morning.  She continues to have some back pain, but knows strengthening the muscles will help. She also has some sciattica pain in back and legs.  Reviewed home exercise with pt today.  Pt plans to walk at park for exercise.  Reviewed THR, Ramsey, RPE, sign and symptoms, and when to call 911 or MD.  Also discussed weather considerations and indoor options.  Pt voiced understanding.  Chelsea Ramsey has been doing well in rehab. She has only attended once since last review.  Today, she had a spell on the treadmill  with dizziness and chest pain.  She was sent to the ED for further work up.  Troponins were negative.   Once she is able to return we will continue to work on her progress.   out since last review   Expected Outcomes  Short: Use RPE daily to regulate intensity. Long: Follow program prescription in THR.  -  Short: Continue to walk daily.  Long: Continue to attend regularly and exercise more.   Short: Cleared to return to rehab.  Long: Continue to improve strength and stamina.   -   Row Name 11/25/18 0942 12/06/18 1421           Exercise Goal Re-Evaluation   Comments  out since last review  out since last review         Discharge Exercise Prescription (Final Exercise Prescription Changes): Exercise Prescription Changes - 10/11/18 1500      Response to Exercise   Blood Pressure (Admit)  126/70    Blood Pressure (Exercise)  148/80    Heart Rate (Admit)  86 bpm    Heart Rate (Exercise)  119 bpm  Heart Rate (Exit)  90 bpm    Rating of Perceived Exertion (Exercise)  13    Symptoms  CP, dizziness    Duration  Continue with 30 min of aerobic exercise without signs/symptoms of physical distress.    Intensity  THRR unchanged      Progression   Progression  Continue to progress workloads to maintain intensity without signs/symptoms of physical distress.      Resistance Training   Training Prescription  Yes    Weight  3 lbs    Reps  10-15      Interval Training   Interval Training  No      Treadmill   MPH  2.3    Grade  1    Minutes  12      Home Exercise Plan   Plans to continue exercise at  Home (comment)   walk at park   Frequency  Add 3 additional days to program exercise sessions.    Initial Home Exercises Provided  09/27/18       Nutrition:  Target Goals: Understanding of nutrition guidelines, daily intake of sodium <1529m, cholesterol <2035m calories 30% from fat and 7% or less from saturated fats, daily to have 5 or more servings of fruits and  vegetables.  Biometrics: Pre Biometrics - 08/25/18 1459      Pre Biometrics   Height  5' 6.5" (1.689 m)    Weight  258 lb 9.6 oz (117.3 kg)    Waist Circumference  47 inches    Hip Circumference  54 inches    Waist to Hip Ratio  0.87 %    BMI (Calculated)  41.12    Single Leg Stand  30 seconds        Nutrition Therapy Plan and Nutrition Goals: Nutrition Therapy & Goals - 09/22/18 1133      Nutrition Therapy   Diet  TLC    Protein (specify units)  8oz    Fiber  20 grams    Whole Grain Foods  3 servings   does not currently choose whole grains   Saturated Fats  14 max. grams    Fruits and Vegetables  5 servings/day   8 Ideal; does not regularly eat fruits and vegetables   Sodium  1500 grams      Personal Nutrition Goals   Nutrition Goal  Work to decrease soda and candy consumption. The best solution would be not to buy these items in the first place to reduce temptation, but you can also start by setting goals such as drinking one less glass of soda and more more glass of water per day, or choosing applesauce/pudding rather than a candy bar as a snack    Personal Goal #2  Increase fruit and vegetable intake by at least one additional serving per week. Long term goal would be to eat multiple servings of fruits and vegetables daily    Personal Goal #3  Rather than 'nibbling' on foods throughout the day, work to create a structured eating plan that includes meals/snacks with at least two food groups. This will help improve fullness and satiety when you eat and decrease cravings    Comments  Pt reports wt regain s/p recent surgery which she suspects is due to increase in snacking, which has taken the place of smoking (trying to quit). She is choosing candy and chips which has lead to her 'craving' these items more now as well. She has also been snacking/ "nibbling" on foods throughout  the day rather than having sit-down meals. She has however been trying to reduce sodium intake and no  longer adds salt to meals. She does not to out to eat often. For beverage she does drink water but also has been drinking juice regularly and finishes a 3L of soda every two days      Intervention Plan   Intervention  Prescribe, educate and counsel regarding individualized specific dietary modifications aiming towards targeted core components such as weight, hypertension, lipid management, diabetes, heart failure and other comorbidities.    Expected Outcomes  Short Term Goal: Understand basic principles of dietary content, such as calories, fat, sodium, cholesterol and nutrients.;Short Term Goal: A plan has been developed with personal nutrition goals set during dietitian appointment.;Long Term Goal: Adherence to prescribed nutrition plan.       Nutrition Assessments: Nutrition Assessments - 08/25/18 1549      MEDFICTS Scores   Pre Score  125       Nutrition Goals Re-Evaluation: Nutrition Goals Re-Evaluation    [place] Name 09/22/18 1147             Goals   Nutrition Goal  Work to decrease soda and candy consumption. The best solution would be not to buy these items in the first place to reduce temptation, but you can also start by setting goals such as drinking one less glass of soda and one more glass of water per day, or choosing applesauce/ pudding rather than a candy bar as a snack       Comment  Currently she drinks 3L of Coke-a-Cola in two days, drinks juices regularly and chooses candy bars and chips as snacks regularly       Expected Outcome  She will slowly decrease SSB and non-nutritious snack intake until they are either eliminated from her diet or consumed only in moderation. She will replace SSB with water and candy/chips with more nutrient-dense snack options         Personal Goal #2 Re-Evaluation   Personal Goal #2  Increase fruit and vegetable intake by at least one additional serving per week. Long term goal would be to eat multiple servings of fruits and vegetables daily          Personal Goal #3 Re-Evaluation   Personal Goal #3  Rather than 'nibbling' on foods througout the day, work to create a structured eating plan that includes meals/snacks with at least two food groups. This will help improve fullness and satiety when you eat and decrease cravings          Nutrition Goals Discharge (Final Nutrition Goals Re-Evaluation): Nutrition Goals Re-Evaluation - 09/22/18 1147      Goals   Nutrition Goal  Work to decrease soda and candy consumption. The best solution would be not to buy these items in the first place to reduce temptation, but you can also start by setting goals such as drinking one less glass of soda and one more glass of water per day, or choosing applesauce/ pudding rather than a candy bar as a snack    Comment  Currently she drinks 3L of Coke-a-Cola in two days, drinks juices regularly and chooses candy bars and chips as snacks regularly    Expected Outcome  She will slowly decrease SSB and non-nutritious snack intake until they are either eliminated from her diet or consumed only in moderation. She will replace SSB with water and candy/chips with more nutrient-dense snack options      Personal Goal #  2 Re-Evaluation   Personal Goal #2  Increase fruit and vegetable intake by at least one additional serving per week. Long term goal would be to eat multiple servings of fruits and vegetables daily      Personal Goal #3 Re-Evaluation   Personal Goal #3  Rather than 'nibbling' on foods througout the day, work to create a structured eating plan that includes meals/snacks with at least two food groups. This will help improve fullness and satiety when you eat and decrease cravings       Psychosocial: Target Goals: Acknowledge presence or absence of significant depression and/or stress, maximize coping skills, provide positive support system. Participant is able to verbalize types and ability to use techniques and skills needed for reducing stress and  depression.   Initial Review & Psychosocial Screening: Initial Psych Review & Screening - 08/25/18 1543      Initial Review   Current issues with  Current Sleep Concerns;Current Stress Concerns   has been sleeping on the couch since surgery (3 months ago) because of sternal discomfort. Wants to start sleeping in bed.    Source of Stress Concerns  Transportation;Unable to perform yard/household activities;Financial    Comments  Does not drive. Relies on family members to drive. Wants to get back to hobbies/doing house work/ building up stamina.       Family Dynamics   Good Support System?  Yes   mother and aunt     Screening Interventions   Interventions  Encouraged to exercise;Program counselor consult;To provide support and resources with identified psychosocial needs;Provide feedback about the scores to participant    Expected Outcomes  Short Term goal: Utilizing psychosocial counselor, staff and physician to assist with identification of specific Stressors or current issues interfering with healing process. Setting desired goal for each stressor or current issue identified.;Long Term Goal: Stressors or current issues are controlled or eliminated.;Short Term goal: Identification and review with participant of any Quality of Life or Depression concerns found by scoring the questionnaire.;Long Term goal: The participant improves quality of Life and PHQ9 Scores as seen by post scores and/or verbalization of changes       Quality of Life Scores:  Quality of Life - 08/25/18 1545      Quality of Life   Select  Quality of Life      Quality of Life Scores   Health/Function Pre  16.4 %    Socioeconomic Pre  22.29 %    Psych/Spiritual Pre  18 %    Family Pre  27 %    GLOBAL Pre  19.27 %      Scores of 19 and below usually indicate a poorer quality of life in these areas.  A difference of  2-3 points is a clinically meaningful difference.  A difference of 2-3 points in the total score of  the Quality of Life Index has been associated with significant improvement in overall quality of life, self-image, physical symptoms, and general health in studies assessing change in quality of life.  PHQ-9: Recent Review Flowsheet Data    Depression screen Chelsea Ramsey [facility] 2/9 11/14/2018 08/25/2018 07/26/2018   Decreased Interest 0 1 0   Down, Depressed, Hopeless 0 0 0   PHQ - 2 Score 0 1 0   Altered sleeping - 2 -   Tired, decreased energy - 2 -   Change in appetite - 1 -   Feeling bad or failure about yourself  - 0 -   Trouble concentrating - 2 -  Moving slowly or fidgety/restless - 0 -   Suicidal thoughts - 0 -   PHQ-9 Score - 8 -   Difficult doing work/chores - Not difficult at all -     Interpretation of Total Score  Total Score Depression Severity:  1-4 = Minimal depression, 5-9 = Mild depression, 10-14 = Moderate depression, 15-19 = Moderately severe depression, 20-27 = Severe depression   Psychosocial Evaluation and Intervention: Psychosocial Evaluation - 09/22/18 1039      Psychosocial Evaluation & Interventions   Interventions  Stress management education;Relaxation education;Encouraged to exercise with the program and follow exercise prescription;Therapist referral    Comments  Counselor met with Ms. Kijowski today [facility]) for initial psychosocial evaluation.  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  Chelsea Ramsey has multiple issues with Fibromyalgia; COPD; CHF: Pulmonary Lung Disease; Osteoarthritis; and OSA in addition to her Cardiac condition.  She has a strong support system with a spouse of 42 years; parents who live locally and active involvement in her local church.  [place] with interrupted sleep and had a sleep study before her surgery and is awaiting a CPAP as a result.  Her appetite is up and down currently.  Chelsea Ramsey reports a long history of depression and anxiety with some panic attacks.  She reports her Dr. just prescribed new meds for this but they are  not working and she "feels worse" with more "irritability."  Counselor encouraged her to let her Dr. know this asap.  She has a trauma history that has never been addressed fully with the loss of an infant at 62 days old (her only child).  Chelsea Ramsey has multiple stressors with her health as well as unresolved grief and trauma.  counselor provided contact info for a therapist locally, and Chelsea Ramsey agreed to pursue this option.  She has goals to lose weight; decrease stress; improve her sleep quality; and increase her stamina and strength while in this program.      Expected Outcomes  Short:  Chelsea Ramsey will contact a therapist locally to support her journey of grief and unresolved trauma.  She also will exercise for her health and her mental health - to cope with stress better.  Long:  Chelsea Ramsey will make positive self-care choices with diet and exercise to improve her health and mental health.    Continue Psychosocial Services   Follow up required by staff       Psychosocial Re-Evaluation:   Psychosocial Discharge (Final Psychosocial Re-Evaluation):   Vocational Rehabilitation: Provide vocational rehab assistance to qualifying candidates.   Vocational Rehab Evaluation & Intervention: Vocational Rehab - 08/25/18 1542      Initial Vocational Rehab Evaluation & Intervention   Assessment shows need for Vocational Rehabilitation  Yes    Vocational Rehab Packet given to patient  --   paperwork will be given first day of class      Education: Education Goals: Education classes will be provided on a variety of topics geared toward better understanding of heart health and risk factor modification. Participant will state understanding/return demonstration of topics presented as noted by education test scores.  Learning Barriers/Preferences: Learning Barriers/Preferences - 08/25/18 1541      Learning Barriers/Preferences   Learning Barriers  None    Learning Preferences  Individual Instruction       Education  Topics:  AED/CPR: - Group verbal and written instruction with the use of models to demonstrate the basic use of the AED with  the basic ABC's of resuscitation.   General Nutrition Guidelines/Fats and Fiber: -Group instruction provided by verbal, written material, models and posters to present the general guidelines for heart healthy nutrition. Gives an explanation and review of dietary fats and fiber.   Controlling Sodium/Reading Food Labels: -Group verbal and written material supporting the discussion of sodium use in heart healthy nutrition. Review and explanation with models, verbal and written materials for utilization of the food label.   Exercise Physiology & General Exercise Guidelines: - Group verbal and written instruction with models to review the exercise physiology of the cardiovascular system and associated critical values. Provides general exercise guidelines with specific guidelines to those with heart or lung disease.    Aerobic Exercise & Resistance Training: - Gives group verbal and written instruction on the various components of exercise. Focuses on aerobic and resistive training programs and the benefits of this training and how to safely progress through these programs..   Flexibility, Balance, Mind/Body Relaxation: Provides group verbal/written instruction on the benefits of flexibility and balance training, including mind/body exercise modes such as yoga, pilates and tai chi.  Demonstration and skill practice provided.   Stress and Anxiety: - Provides group verbal and written instruction about the health risks of elevated stress and causes of high stress.  Discuss the correlation between heart/lung disease and anxiety and treatment options. Review healthy ways to manage with stress and anxiety.   Cardiac Rehab from 10/11/2018 in [facility] Cardiac and Pulmonary Rehab  Date  09/13/18  Educator  [facility]  Instruction Review Code  1- Verbalizes Understanding       Depression: - Provides group verbal and written instruction on the correlation between heart/lung disease and depressed mood, treatment options, and the stigmas associated with seeking treatment.   Anatomy & Physiology of the Heart: - Group verbal and written instruction and models provide basic cardiac anatomy and physiology, with the coronary electrical and arterial systems. Review of Valvular disease and Heart Failure   Cardiac Procedures: - Group verbal and written instruction to review commonly prescribed medications for heart disease. Reviews the medication, class of the drug, and side effects. Includes the steps to properly store meds and maintain the prescription regimen. (beta blockers and nitrates)   Cardiac Rehab from 10/11/2018 in [facility] Cardiac and Pulmonary Rehab  Date  09/27/18  Educator  [place]  Instruction Review Code  1- Verbalizes Understanding      Cardiac Medications I: - Group verbal and written instruction to review commonly prescribed medications for heart disease. Reviews the medication, class of the drug, and side effects. Includes the steps to properly store meds and maintain the prescription regimen.   Cardiac Medications II: -Group verbal and written instruction to review commonly prescribed medications for heart disease. Reviews the medication, class of the drug, and side effects. (all other drug classes)   Cardiac Rehab from 10/11/2018 in [facility] Cardiac and Pulmonary Rehab  Date  09/08/18  Educator  [place]  Instruction Review Code  1- Verbalizes Understanding       Go Sex-Intimacy & Heart Disease, Get SMART - Goal Setting: - Group verbal and written instruction through game format to discuss heart disease and the return to sexual intimacy. Provides group verbal and written material to discuss and apply goal setting through the application of the S.M.A.R.T. Method.   Cardiac Rehab from 10/11/2018 in [facility] Cardiac and Pulmonary Rehab  Date  09/27/18  Educator   [place]  Instruction Review Code  1- Verbalizes Understanding  Other Matters of the Heart: - Provides group verbal, written materials and models to describe Stable Angina and Peripheral Artery. Includes description of the disease process and treatment options available to the cardiac patient.   Exercise & Equipment Safety: - Individual verbal instruction and demonstration of equipment use and safety with use of the equipment.   Cardiac Rehab from 10/11/2018 in [facility] Cardiac and Pulmonary Rehab  Date  08/25/18  Educator  [facility]  Instruction Review Code  1- Verbalizes Understanding      Infection Prevention: - Provides verbal and written material to individual with discussion of infection control including proper hand washing and proper equipment cleaning during exercise session.   Cardiac Rehab from 10/11/2018 in [facility] Cardiac and Pulmonary Rehab  Date  08/25/18  Educator  [facility]  Instruction Review Code  1- Verbalizes Understanding      Falls Prevention: - Provides verbal and written material to individual with discussion of falls prevention and safety.   Cardiac Rehab from 10/11/2018 in [facility] Cardiac and Pulmonary Rehab  Date  08/25/18  Educator  [facility]  Instruction Review Code  1- Verbalizes Understanding      Diabetes: - Individual verbal and written instruction to review signs/symptoms of diabetes, desired ranges of glucose level fasting, after meals and with exercise. Acknowledge that pre and post exercise glucose checks will be done for 3 sessions at entry of program.   Know Your Numbers and Risk Factors: -Group verbal and written instruction about important numbers in your health.  Discussion of what are risk factors and how they play a role in the disease process.  Review of Cholesterol, Blood Pressure, Diabetes, and BMI and the role they play in your overall health.   Cardiac Rehab from 10/11/2018 in [facility] Cardiac and Pulmonary Rehab  Date  09/08/18  Educator  [place]  Instruction  Review Code  1- Verbalizes Understanding      Sleep Hygiene: -Provides group verbal and written instruction about how sleep can affect your health.  Define sleep hygiene, discuss sleep cycles and impact of sleep habits. Review good sleep hygiene tips.    Cardiac Rehab from 10/11/2018 in [facility] Cardiac and Pulmonary Rehab  Date  10/11/18  Educator  [facility]  Instruction Review Code  1- Verbalizes Understanding      Other: -Provides group and verbal instruction on various topics (see comments)   Knowledge Questionnaire Score: Knowledge Questionnaire Score - 08/25/18 1541      Knowledge Questionnaire Score   Pre Score  19/26   correct answers reviewed with [facility], focus on exercise, nutrition, and MI      Core Components/Risk Factors/Patient Goals at Admission: Personal Goals and Risk Factors at Admission - 08/25/18 1539      Core Components/Risk Factors/Patient Goals on Admission    Weight Management  Yes;Obesity;Weight Loss    Intervention  Weight Management: Develop a combined nutrition and exercise program designed to reach desired caloric intake, while maintaining appropriate intake of nutrient and fiber, sodium and fats, and appropriate energy expenditure required for the weight goal.;Weight Management: Provide education and appropriate resources to help participant work on and attain dietary goals.;Weight Management/Obesity: Establish reasonable short term and long term weight goals.;Obesity: Provide education and appropriate resources to help participant work on and attain dietary goals.    Admit Weight  258 lb 9.6 oz (117.3 kg)    Goal Weight: Short Term  253 lb (114.8 kg)    Goal Weight: Long Term  200 lb (90.7 kg)  Expected Outcomes  Short Term: Continue to assess and modify interventions until short term weight is achieved;Long Term: Adherence to nutrition and physical activity/exercise program aimed toward attainment of established weight goal;Weight Loss: Understanding of general  recommendations for a balanced deficit meal plan, which promotes 1-2 lb weight loss per week and includes a negative energy balance of [id number] kcal/d;Understanding recommendations for meals to include 15-35% energy as protein, 25-35% energy from fat, 35-60% energy from carbohydrates, less than 242m of dietary cholesterol, 20-35 gm of total fiber daily;Understanding of distribution of calorie intake throughout the day with the consumption of 4-5 meals/snacks    Tobacco Cessation  Yes    Intervention  Assist the participant in steps to quit. Provide individualized education and counseling about committing to Tobacco Cessation, relapse prevention, and pharmacological support that can be provided by physician.;OAdvice worker assist with locating and accessing local/national Quit Smoking programs, and support quit date choice.    Expected Outcomes  Short Term: Will demonstrate readiness to quit, by selecting a quit date.;Short Term: Will quit all tobacco product use, adhering to prevention of relapse plan.;Long Term: Complete abstinence from all tobacco products for at least 12 months from quit date.    Heart Failure  Yes    Intervention  Provide a combined exercise and nutrition program that is supplemented with education, support and counseling about heart failure. Directed toward relieving symptoms such as shortness of breath, decreased exercise tolerance, and extremity edema.    Expected Outcomes  Improve functional capacity of life;Short term: Attendance in program 2-3 days a week with increased exercise capacity. Reported lower sodium intake. Reported increased fruit and vegetable intake. Reports medication compliance.;Short term: Daily weights obtained and reported for increase. Utilizing diuretic protocols set by physician.;Long term: Adoption of self-care skills and reduction of barriers for early signs and symptoms recognition and intervention leading to self-care maintenance.     Hypertension  Yes    Intervention  Provide education on lifestyle modifcations including regular physical activity/exercise, weight management, moderate sodium restriction and increased consumption of fresh fruit, vegetables, and low fat dairy, alcohol moderation, and smoking cessation.;Monitor prescription use compliance.    Expected Outcomes  Short Term: Continued assessment and intervention until BP is < 140/970mHG in hypertensive participants. < 130/8049mG in hypertensive participants with diabetes, heart failure or chronic kidney disease.;Long Term: Maintenance of blood pressure at goal levels.       Core Components/Risk Factors/Patient Goals Review:  Goals and Risk Factor Review    [place] Name 09/27/18 1003             Core Components/Risk Factors/Patient Goals Review   Personal Goals Review  Heart Failure;Weight Management/Obesity;Hypertension;Improve shortness of breath with ADL's       Review  [place]aine Ramsey off to a good start in rehab. She has already started to lose some weight and is down to 156lbs.  She has cut back on soda and candy bars.  She weighs daily and checks blood pressure and saturations daily as well!!  She has been having some shortness of breath and is using her oxygen at night and occasionally during the day. She still has some incisional pain but knows that she is getting better.  Still within her recovery window.  She is doing well with her medicaitons.        Expected Outcomes  Short: Continue to weigh daily and work weight loss.  Long: Continue to manage heart failure.  Core Components/Risk Factors/Patient Goals at Discharge (Final Review):  Goals and Risk Factor Review - 09/27/18 1003      Core Components/Risk Factors/Patient Goals Review   Personal Goals Review  Heart Failure;Weight Management/Obesity;Hypertension;Improve shortness of breath with ADL's    Review  Chelsea Ramsey is off to a good start in rehab. She has already started to lose some weight and is down  to 156lbs.  She has cut back on soda and candy bars.  She weighs daily and checks blood pressure and saturations daily as well!!  She has been having some shortness of breath and is using her oxygen at night and occasionally during the day. She still has some incisional pain but knows that she is getting better.  Still within her recovery window.  She is doing well with her medicaitons.     Expected Outcomes  Short: Continue to weigh daily and work weight loss.  Long: Continue to manage heart failure.        ITP Comments: ITP Comments    Row Name 08/25/18 1531 09/07/18 0607 10/05/18 0555 10/11/18 1304 10/24/18 1609   ITP Comments  Med Review completed. Initial ITP created. Diagnosis can be found in [facility] 7/22  30 day review completed. ITP sent to [name], Medical Director of Cardiac Rehab. Continue with ITP unless changes are made by physician  Starts sessions on 10/10  30 day review. Continue with ITP unless direccted changes per Medical Director Chart Review.  Pt experienced dizziness while walking on the treadmill. Assisted patient to seating. Patient then reported nausea and chest pain. She was taken to the Emergency Department for further evaluation. BP 148/80, O2 100%, HR 90.  Called to check on pt.  She was sent to [facility] ED after office visit with high PHQ and she left without being seen.  She has not returned to rehab.  Left message.    [place] Name 11/02/18 0559 11/07/18 1600 11/15/18 1605 11/25/18 0941 11/29/18 0625   ITP Comments  30 day review. Continue with ITP unless direccted changes per Medical Director Chart Review. Still not returned does have clearance to return  Called to check on pt.  She had another round of atypical chest pain, during which she was admitted.  They did not find any cause other than anxiety.  She was discharged and will schedule a follow up with cardiology.  She was encouraged to continue with rehab.  She hopes to return next week.   Called to check on pt.  She was  seen by pain clinic and referred for bariatric surgery.   Left message.   Waiting for clearance to return to rehab.  Has a follow up appt with heart failure clinic on 12/02/18.    30 Day Review. Continue with ITP unless directed changes per Medical Director review Remains out for medical concerns   [place] Name 12/06/18 1420 12/20/18 1340         ITP Comments  [place] is currently admitted for CHF and respiratory failure.  She has seen Diane as inpatient and knows that she needs clearance to return to rehab.    Called to check on Chelsea Ramsey.  She has been out since 11/12.  She continues to have on going issues and still needs clearance to return.  We will discharge her at this time with the hope that once her health recovers she will be able to return to finish the program.          Comments: Discharge ITP

## 2018-12-21 ENCOUNTER — Other Ambulatory Visit: Payer: Self-pay

## 2018-12-21 ENCOUNTER — Emergency Department: Payer: Medicaid Other

## 2018-12-21 ENCOUNTER — Telehealth: Payer: Self-pay | Admitting: Family

## 2018-12-21 ENCOUNTER — Encounter: Payer: Self-pay | Admitting: Emergency Medicine

## 2018-12-21 ENCOUNTER — Emergency Department
Admission: EM | Admit: 2018-12-21 | Discharge: 2018-12-21 | Disposition: A | Discharge status: Home / Self Care | Payer: Medicaid Other | Attending: Emergency Medicine | Admitting: Emergency Medicine

## 2018-12-21 DIAGNOSIS — Y999 Unspecified external cause status: Secondary | ICD-10-CM | POA: Insufficient documentation

## 2018-12-21 DIAGNOSIS — S39012A Strain of muscle, fascia and tendon of lower back, initial encounter: Secondary | ICD-10-CM | POA: Diagnosis not present

## 2018-12-21 DIAGNOSIS — Y939 Activity, unspecified: Secondary | ICD-10-CM | POA: Diagnosis not present

## 2018-12-21 DIAGNOSIS — Y929 Unspecified place or not applicable: Secondary | ICD-10-CM | POA: Insufficient documentation

## 2018-12-21 DIAGNOSIS — Z79899 Other long term (current) drug therapy: Secondary | ICD-10-CM | POA: Insufficient documentation

## 2018-12-21 DIAGNOSIS — I509 Heart failure, unspecified: Secondary | ICD-10-CM | POA: Insufficient documentation

## 2018-12-21 DIAGNOSIS — S8991XA Unspecified injury of right lower leg, initial encounter: Secondary | ICD-10-CM | POA: Diagnosis present

## 2018-12-21 DIAGNOSIS — W19XXXA Unspecified fall, initial encounter: Secondary | ICD-10-CM | POA: Diagnosis not present

## 2018-12-21 DIAGNOSIS — I110 Hypertensive heart disease with heart failure: Secondary | ICD-10-CM | POA: Diagnosis not present

## 2018-12-21 DIAGNOSIS — F17210 Nicotine dependence, cigarettes, uncomplicated: Secondary | ICD-10-CM | POA: Insufficient documentation

## 2018-12-21 DIAGNOSIS — J449 Chronic obstructive pulmonary disease, unspecified: Secondary | ICD-10-CM | POA: Diagnosis not present

## 2018-12-21 DIAGNOSIS — Z7901 Long term (current) use of anticoagulants: Secondary | ICD-10-CM | POA: Diagnosis not present

## 2018-12-21 DIAGNOSIS — S8001XA Contusion of right knee, initial encounter: Secondary | ICD-10-CM | POA: Insufficient documentation

## 2018-12-21 MED ORDER — ONDANSETRON HCL 4 MG/2ML IJ SOLN
4.0000 mg | Freq: Once | INTRAMUSCULAR | Status: AC
  Administered 2018-12-21: 4 mg via INTRAVENOUS
  Filled 2018-12-21: qty 2

## 2018-12-21 MED ORDER — HYDROMORPHONE HCL 1 MG/ML IJ SOLN
1.0000 mg | Freq: Once | INTRAMUSCULAR | Status: AC
  Administered 2018-12-21: 1 mg via INTRAVENOUS
  Filled 2018-12-21: qty 1

## 2018-12-21 MED ORDER — METOPROLOL TARTRATE 100 MG PO TABS: 100.0000 mg | ORAL_TABLET | Freq: Two times a day (BID) | ORAL | 3 refills | Status: DC

## 2018-12-21 NOTE — ED Notes (Signed)
Pt taken to Xray.

## 2018-12-21 NOTE — Discharge Instructions (Addendum)
Follow-up with orthopedics.  Please call for an appointment.  Apply ice to the knee.  Wear the knee immobilizer to decrease the swelling in the joint effusion in the knee.

## 2018-12-21 NOTE — ED Notes (Signed)
Pt placed in bed with this RN assist and Jonni Sanger, [employer] assist. Pt crying states her left knee, upper thigh and lower back hurting "worse than ever." PA made aware, orders obtained.

## 2018-12-21 NOTE — Telephone Encounter (Signed)
Patient called to say that she doesn't feel like the metolazone is working anymore. Her weight has ranged from 269-273 pounds. She says that she's taken 3 of the metolazone (had sent in 10 tablets) but she says that she doesn't feel like she's urinating as much as she was.  Denies worsening of symptoms such as shortness of breath or edema and says that she has an appointment with [facility] cardiology 12/26/2018. Says that she's watching her sodium intake and is drinking ~50 ounces of fluid daily.  Advised her to continue monitoring her weight, write those weights down and take them when she goes to see cardiology on the 27th. She can take the metolazone as needed as she has been doing. Patient was comfortable with continuing this plan of care.

## 2018-12-21 NOTE — ED Triage Notes (Signed)
Pt reports tripped and fell last night and now has pain to her back, right knee and right leg. Pt states that she had a knee replacement on that knee as well. Pt states she heard something pop but is unsure of what it was. Pt reports painful to walk on right side.

## 2018-12-21 NOTE — ED Notes (Signed)
Xray in room, iv and pain med initiated per order. Pt tolerated well.

## 2018-12-21 NOTE — ED Provider Notes (Signed)
[name] Emergency Department Provider Note  ____________________________________________   First MD Initiated Contact with Patient 12/21/18 0920     (approximate)  I have reviewed the triage vital signs and the nursing notes.   HISTORY  Chief Complaint Fall; Knee Pain; Leg Pain; and Back Pain    HPI Chelsea Ramsey is a 53 y.o. female presents emergency department complaining of right knee and upper leg pain.  She states that she fell yesterday.  Has been unable to stand and bear weight without difficulty.  She denies any head injury, chest pain or shortness of breath.  She states she has had a knee replacement in that knee felt a pop.  She is unsure of what pop.    Past Medical History:  Diagnosis Date  . Allergy    seasonal  . Anxiety   . Arthritis    Right Knee  . Asthma   . CHF (congestive heart failure) ([place])   . COPD (chronic obstructive pulmonary disease) ([place])   . Coronary artery disease    Leaky heart valve  . Fibromyalgia   . GERD (gastroesophageal reflux disease)   . Hypertension   . Pneumonia   . PUD (peptic ulcer disease)   . Pulmonary HTN ([place])   . Rheumatic fever/heart disease   . Sleep apnea     Patient Active Problem List   Diagnosis Date Noted  . Elevated sed rate 12/19/2018  . Elevated C-reactive protein 12/19/2018  . Diplopia 12/14/2018  . Dizziness 12/14/2018  . Headache, chronic daily 12/14/2018  . Numbness and tingling of both feet 12/14/2018  . CHF (congestive heart failure) ([place]) 12/04/2018  . CAP (community acquired pneumonia) 12/01/2018  . Class 3 severe obesity with body mass index (BMI) of 45.0 to 49.9 in adult ([place]) 11/14/2018  . Chronic anticoagulation (Xarelto) 11/14/2018  . Vitamin D deficiency 11/14/2018  . Chronic knee pain after total replacement (Right) 11/14/2018  . Chest pain 10/17/2018  . Chronic low back pain (Primary Area of Pain) (Bilateral) w/ sciatica (Left) 10/10/2018  . Chronic  lower extremity pain (Secondary Area of Pain) (Left) 10/10/2018  . Sternal pain (Tertiary Area of Pain) 10/10/2018  . Fibromyalgia (Fourth Area of Pain) 10/10/2018  . Chronic knee pain (Fifth Area of Pain) (Right) 10/10/2018  . Chronic sacroiliac joint pain 10/10/2018  . Chronic pain syndrome 10/10/2018  . Long term current use of opiate analgesic 10/10/2018  . Pharmacologic therapy 10/10/2018  . Disorder of skeletal system 10/10/2018  . Problems influencing health status 10/10/2018  . Chronic diastolic heart failure ([place]) 07/26/2018  . HTN (hypertension) 07/26/2018  . Acute CHF (congestive heart failure) ([place]) 07/17/2018  . Melena   . Nausea and vomiting   . Acute esophagogastric ulcer   . Intractable vomiting with nausea   . GIB (gastrointestinal bleeding) 05/22/2018  . A-fib ([place]) 04/22/2018  . COPD (chronic obstructive pulmonary disease) ([place]) 12/21/2017  . COPD (chronic obstructive pulmonary disease) with acute bronchitis ([place]) 12/20/2017  . Osteoarthritis of knee (Right) 09/04/2016    Past Surgical History:  Procedure Laterality Date  . ADENOIDECTOMY    . ESOPHAGOGASTRODUODENOSCOPY (EGD) WITH PROPOFOL N/A 05/25/2018   Procedure: ESOPHAGOGASTRODUODENOSCOPY (EGD) WITH PROPOFOL;  Surgeon: Lucilla Lame, MD;  Location: [facility] ENDOSCOPY;  Service: Endoscopy;  Laterality: N/A;  . MITRAL VALVE REPLACEMENT    . MULTIPLE TOOTH EXTRACTIONS    . TONSILLECTOMY    . TOTAL KNEE ARTHROPLASTY Right 09/04/2016   Procedure: TOTAL KNEE ARTHROPLASTY; with lateral release;  Surgeon: Earlie Server, MD;  Location: [facility];  Service: Orthopedics;  Laterality: Right;    Prior to Admission medications   Medication Sig Start Date End Date Taking? Authorizing Provider  ALPRAZolam (XANAX) 0.25 MG tablet Take 1 tablet (0.25 mg total) by mouth daily as needed for anxiety. 12/06/18   Vaughan Basta, MD  bisacodyl (DULCOLAX) 5 MG EC tablet Take 2 tablets (10 mg total) by mouth daily as needed for  moderate constipation. 07/21/18   Epifanio Lesches, MD  budesonide-formoterol (SYMBICORT) 160-4.5 MCG/ACT inhaler Inhale 2 puffs into the lungs 2 (two) times daily. 02/26/16   [provider]  butalbital-acetaminophen-caffeine (FIORICET, ESGIC) 50-325-40 MG tablet Take 1 tablet by mouth as needed.    [provider]  Calcium Carbonate-Vit D-Min (GNP CALCIUM 1200) 1200-1000 MG-UNIT CHEW Chew 1,200 mg by mouth daily with breakfast. Take in combination with vitamin D and magnesium. 11/14/18 05/13/19  Milinda Pointer, MD  Cholecalciferol (VITAMIN D3) 125 MCG (5000 UT) CAPS Take 1 capsule (5,000 Units total) by mouth daily with breakfast. Take along with calcium and magnesium. 11/14/18 05/13/19  Milinda Pointer, MD  ergocalciferol (VITAMIN D2) 1.25 MG (50000 UT) capsule Take 1 capsule (50,000 Units total) by mouth 2 (two) times a week. X 6 weeks. 11/14/18 12/26/18  Milinda Pointer, MD  furosemide (LASIX) 40 MG tablet Take 1 tablet (40 mg total) by mouth 2 (two) times daily. 12/06/18 12/06/19  Vaughan Basta, MD  gabapentin (NEURONTIN) 600 MG tablet Take 0.5 tablets (300 mg total) by mouth 2 (two) times daily. Patient taking differently: Take 600 mg by mouth 3 (three) times daily.  12/06/18   Vaughan Basta, MD  guaiFENesin (MUCINEX) 600 MG 12 hr tablet Take 1 tablet (600 mg total) by mouth 2 (two) times daily. 11/19/18   Salary, Avel Peace, MD  ipratropium-albuterol (DUONEB) 0.5-2.5 (3) MG/3ML SOLN Inhale 3 mLs into the lungs 3 (three) times daily as needed (respiratory).  02/26/16 12/20/18  [provider]  lactulose (CHRONULAC) 10 GM/15ML solution Take 45 mLs (30 g total) by mouth 2 (two) times daily as needed for mild constipation. 07/21/18   Epifanio Lesches, MD  magnesium oxide (MAG-OX) 400 MG tablet Take 400 mg by mouth 2 (two) times daily. 07/11/18 07/11/19  [provider]  meclizine (ANTIVERT) 12.5 MG tablet Take 25 mg by mouth 3 (three) times  daily as needed for dizziness.    [provider]  metolazone (ZAROXOLYN) 2.5 MG tablet Take 1 tablet (2.5 mg total) by mouth daily. 12/14/18 03/14/19  Alisa Graff, FNP  metoprolol tartrate (LOPRESSOR) 100 MG tablet Take 1 tablet (100 mg total) by mouth 2 (two) times daily. 11/05/18   Bettey Costa, MD  nicotine (NICODERM CQ - DOSED IN MG/24 HR) 7 mg/24hr patch Place 1 patch (7 mg total) onto the skin daily. 11/06/18   Bettey Costa, MD  nitroGLYCERIN (NITROSTAT) 0.4 MG SL tablet Place 1 tablet (0.4 mg total) under the tongue every 5 (five) minutes as needed for chest pain. 11/05/18   Bettey Costa, MD  nortriptyline (PAMELOR) 10 MG capsule Take 10 mg by mouth Nightly. 12/14/18   [provider]  omeprazole (PRILOSEC) 40 MG capsule Take 40 mg by mouth 2 (two) times daily.    [provider]  Oxycodone HCl 10 MG TABS Take 0.5-1 tablets (5-10 mg total) by mouth 2 (two) times daily as needed for up to 30 days. Must last 30 days. MAX.: 2/day 12/21/18 01/20/19  Milinda Pointer, MD  potassium chloride SA (  K-DUR,KLOR-CON) 20 MEQ tablet Take 1 tablet (20 mEq total) by mouth daily. And additional tablet when taking metolazone 12/12/18   Darylene Price A, FNP  promethazine (PHENERGAN) 12.5 MG tablet Take 1 tablet (12.5 mg total) by mouth every 6 (six) hours as needed for nausea or vomiting. 11/06/18   Bettey Costa, MD  rivaroxaban (XARELTO) 20 MG TABS tablet Take 20 mg by mouth every evening.  07/11/18 07/11/19  [provider]  tiZANidine (ZANAFLEX) 4 MG tablet Take 4 mg by mouth at bedtime.  08/10/18   [provider]  topiramate (TOPAMAX) 25 MG tablet Take 25 mg by mouth 2 (two) times daily.  08/10/18   [provider]    Allergies Amiodarone; Aspirin; Flexeril [cyclobenzaprine]; Trazamine [trazodone & diet manage prod]; Codeine; and Tramadol  Family History  Problem Relation Age of Onset  . COPD Mother   . Cancer Mother        Bone  . Asthma Mother   .  Congestive Heart Failure Father     Social History Social History   Tobacco Use  . Smoking status: Current Every Day Smoker    Packs/day: 0.50    Types: Cigarettes  . Smokeless tobacco: Never Used  Substance Use Topics  . Alcohol use: Not Currently    Alcohol/week: 3.0 standard drinks    Types: 3 Cans of beer per week    Comment: 16 oz per week  . Drug use: Not Currently    Comment: Previous use of cocaine and marijuana last use 07/31/16     Review of Systems  Constitutional: No fever/chills Eyes: No visual changes. ENT: No sore throat. Respiratory: Denies cough Genitourinary: Negative for dysuria. Musculoskeletal: Positive for back pain, positive for knee and upper thigh pain. Skin: Negative for rash.    ____________________________________________   PHYSICAL EXAM:  VITAL SIGNS: ED Triage Vitals  Enc Vitals Group     BP 12/21/18 0911 111/63     Pulse Rate 12/21/18 0911 92     Resp 12/21/18 0911 20     Temp 12/21/18 0911 98.2 F (36.8 C)     Temp Source 12/21/18 0911 Oral     SpO2 12/21/18 0911 100 %     Weight 12/21/18 0903 273 lb (123.8 kg)     Height 12/21/18 0903 5\' 4"  (1.626 m)     Head Circumference --      Peak Flow --      Pain Score 12/21/18 0903 9     Pain Loc --      Pain Edu? --      Excl. in [place]? --    Constitutional: Alert and oriented. Well appearing and in no acute distress. Eyes: Conjunctivae are normal.  Head: Atraumatic. Nose: No congestion/rhinnorhea. Mouth/Throat: Mucous membranes are moist.   Neck:  supple no lymphadenopathy noted Cardiovascular: Normal rate, regular rhythm. Heart sounds are normal Respiratory: Normal respiratory effort.  No retractions, lungs c t a  GU: deferred Musculoskeletal: right knee, thigh and calf are tender, lumbar spine is minimally tender Neurologic:  Normal speech and language.  Skin:  Skin is warm, dry and intact. No rash noted. Psychiatric: Mood and affect are normal. Speech and behavior are  normal.  ____________________________________________   LABS (all labs ordered are listed, but only abnormal results are displayed)  Labs Reviewed - No data to display ____________________________________________   ____________________________________________  RADIOLOGY  [place]s of right knee is neg for fracture Xray of right femur is neg for fracture  ____________________________________________   PROCEDURES  Procedure(s) performed:  Ace wrap knee immobilizer, walker  Procedures    ____________________________________________   INITIAL IMPRESSION / ASSESSMENT AND PLAN / ED COURSE  Pertinent labs & imaging results that were available during my care of the patient were reviewed by me and considered in my medical decision making (see chart for details).   Patient is a 53 year old female with many comorbidities that fell yesterday.  She states that she landed on her knee and felt a pop.  She has history of a knee replacement.  Physical exam shows that the knee is tender and swollen, femur is slightly tender, posterior leg is a little tender along the calf and ham string.  X-ray of the right knee is negative X-ray of the right femur is negative  Explained the findings to the patient.  She was placed in Ace wrap, knee immobilizer, and given a walker.  She is on a pain contract so she was not given additional pain medication.  She is to follow-up with her regular orthopedist.  See her regular doctor as needed.  Return emergency department worsening.  She states she understands will comply.  She is discharged stable condition.     As part of my medical decision making, I reviewed the following data within the [facility] notes reviewed and incorporated, Old chart reviewed, Radiograph reviewed x-rays for right knee and femur are negative, Notes from prior ED visits and [place] Controlled Substance  Database  ____________________________________________   FINAL CLINICAL IMPRESSION(S) / ED DIAGNOSES  Final diagnoses:  Fall, initial encounter  Contusion of right knee, initial encounter  Lumbar strain, initial encounter      NEW MEDICATIONS STARTED DURING THIS VISIT:  New Prescriptions   No medications on file     Note:  This document was prepared using Dragon voice recognition software and may include unintentional dictation errors.    Versie Starks, PA-C 12/21/18 1047    Nance Pear, MD 12/21/18 [phone]

## 2018-12-22 LAB — TOXASSURE SELECT 13 (MW), URINE

## 2018-12-23 LAB — 25-HYDROXY VITAMIN D LCMS D2+D3
25-Hydroxy, Vitamin D-2: 33 ng/mL
25-Hydroxy, Vitamin D-3: 4.7 ng/mL

## 2018-12-23 LAB — ANA W/REFLEX IF POSITIVE: Anti Nuclear Antibody(ANA): NEGATIVE

## 2018-12-23 LAB — RHEUMATOID FACTOR: Rheumatoid fact SerPl-aCnc: 11.7 IU/mL (ref 0.0–13.9)

## 2018-12-23 LAB — 25-HYDROXYVITAMIN D LCMS D2+D3: 25-HYDROXY, VITAMIN D: 38 ng/mL

## 2018-12-27 ENCOUNTER — Encounter: Payer: Self-pay | Admitting: Emergency Medicine

## 2018-12-27 ENCOUNTER — Ambulatory Visit (HOSPITAL_BASED_OUTPATIENT_CLINIC_OR_DEPARTMENT_OTHER): Payer: Medicaid Other | Admitting: Pain Medicine

## 2018-12-27 ENCOUNTER — Emergency Department: Payer: Medicaid Other

## 2018-12-27 ENCOUNTER — Emergency Department
Admission: EM | Admit: 2018-12-27 | Discharge: 2018-12-27 | Disposition: A | Discharge status: Home / Self Care | Payer: Medicaid Other | Attending: Emergency Medicine | Admitting: Emergency Medicine

## 2018-12-27 DIAGNOSIS — Z7901 Long term (current) use of anticoagulants: Secondary | ICD-10-CM | POA: Diagnosis not present

## 2018-12-27 DIAGNOSIS — G8929 Other chronic pain: Secondary | ICD-10-CM

## 2018-12-27 DIAGNOSIS — J449 Chronic obstructive pulmonary disease, unspecified: Secondary | ICD-10-CM | POA: Diagnosis not present

## 2018-12-27 DIAGNOSIS — Z952 Presence of prosthetic heart valve: Secondary | ICD-10-CM | POA: Insufficient documentation

## 2018-12-27 DIAGNOSIS — M47816 Spondylosis without myelopathy or radiculopathy, lumbar region: Secondary | ICD-10-CM

## 2018-12-27 DIAGNOSIS — M25571 Pain in right ankle and joints of right foot: Secondary | ICD-10-CM | POA: Diagnosis present

## 2018-12-27 DIAGNOSIS — Z6841 Body Mass Index (BMI) 40.0 and over, adult: Secondary | ICD-10-CM

## 2018-12-27 DIAGNOSIS — Z79899 Other long term (current) drug therapy: Secondary | ICD-10-CM | POA: Insufficient documentation

## 2018-12-27 DIAGNOSIS — I110 Hypertensive heart disease with heart failure: Secondary | ICD-10-CM | POA: Insufficient documentation

## 2018-12-27 DIAGNOSIS — Z96651 Presence of right artificial knee joint: Secondary | ICD-10-CM | POA: Insufficient documentation

## 2018-12-27 DIAGNOSIS — F17210 Nicotine dependence, cigarettes, uncomplicated: Secondary | ICD-10-CM | POA: Diagnosis not present

## 2018-12-27 DIAGNOSIS — M545 Low back pain: Secondary | ICD-10-CM

## 2018-12-27 DIAGNOSIS — M5136 Other intervertebral disc degeneration, lumbar region: Secondary | ICD-10-CM

## 2018-12-27 DIAGNOSIS — I509 Heart failure, unspecified: Secondary | ICD-10-CM | POA: Diagnosis not present

## 2018-12-27 DIAGNOSIS — M109 Gout, unspecified: Secondary | ICD-10-CM

## 2018-12-27 DIAGNOSIS — R937 Abnormal findings on diagnostic imaging of other parts of musculoskeletal system: Secondary | ICD-10-CM

## 2018-12-27 LAB — CBC
HCT: 33.3 % — ABNORMAL LOW (ref 36.0–46.0)
Hemoglobin: 10.2 g/dL — ABNORMAL LOW (ref 12.0–15.0)
MCH: 25.1 pg — ABNORMAL LOW (ref 26.0–34.0)
MCHC: 30.6 g/dL (ref 30.0–36.0)
MCV: 82 fL (ref 80.0–100.0)
PLATELETS: 323 10*3/uL (ref 150–400)
RBC: 4.06 MIL/uL (ref 3.87–5.11)
RDW: 14.3 % (ref 11.5–15.5)
WBC: 9.4 10*3/uL (ref 4.0–10.5)
nRBC: 0 % (ref 0.0–0.2)

## 2018-12-27 LAB — BASIC METABOLIC PANEL
Anion gap: 7 (ref 5–15)
BUN: 12 mg/dL (ref 6–20)
CO2: 30 mmol/L (ref 22–32)
Calcium: 8.8 mg/dL — ABNORMAL LOW (ref 8.9–10.3)
Chloride: 99 mmol/L (ref 98–111)
Creatinine, Ser: 0.59 mg/dL (ref 0.44–1.00)
GFR calc Af Amer: >60 mL/min (ref >60)
GFR calc non Af Amer: >60 mL/min (ref >60)
Glucose, Bld: 94 mg/dL (ref 70–99)
Potassium: 3.7 mmol/L (ref 3.5–5.1)
Sodium: 136 mmol/L (ref 135–145)

## 2018-12-27 LAB — URIC ACID: Uric Acid, Serum: 10.3 mg/dL — ABNORMAL HIGH (ref 2.5–7.1)

## 2018-12-27 MED ORDER — PREDNISONE 10 MG PO TABS: ORAL_TABLET | ORAL | 0 refills | Status: DC

## 2018-12-27 MED ORDER — LACTATED RINGERS IV SOLN: 1000.0000 mL | Freq: Once | INTRAVENOUS | Status: DC

## 2018-12-27 MED ORDER — LIDOCAINE HCL 2 % IJ SOLN: 20.0000 mL | Freq: Once | INTRAMUSCULAR | Status: DC

## 2018-12-27 MED ORDER — TRIAMCINOLONE ACETONIDE 40 MG/ML IJ SUSP: 80.0000 mg | Freq: Once | INTRAMUSCULAR | Status: DC

## 2018-12-27 MED ORDER — FENTANYL CITRATE (PF) 100 MCG/2ML IJ SOLN: 25.0000 ug | INTRAMUSCULAR | Status: DC | PRN

## 2018-12-27 MED ORDER — COLCHICINE 0.6 MG PO TABS: 0.6000 mg | ORAL_TABLET | Freq: Two times a day (BID) | ORAL | 0 refills | Status: DC

## 2018-12-27 MED ORDER — ROPIVACAINE HCL 2 MG/ML IJ SOLN: 18.0000 mL | Freq: Once | INTRAMUSCULAR | Status: DC

## 2018-12-27 MED ORDER — COLCHICINE 0.6 MG PO TABS
0.6000 mg | ORAL_TABLET | Freq: Once | ORAL | Status: AC
  Administered 2018-12-27: 0.6 mg via ORAL
  Filled 2018-12-27: qty 1

## 2018-12-27 MED ORDER — METHYLPREDNISOLONE SODIUM SUCC 125 MG IJ SOLR: 80.0000 mg | Freq: Once | INTRAMUSCULAR | Status: DC

## 2018-12-27 MED ORDER — MIDAZOLAM HCL 5 MG/5ML IJ SOLN: 1.0000 mg | INTRAMUSCULAR | Status: DC | PRN

## 2018-12-27 MED ORDER — OXYCODONE-ACETAMINOPHEN 5-325 MG PO TABS
1.0000 | ORAL_TABLET | Freq: Once | ORAL | Status: AC
  Administered 2018-12-27: 1 via ORAL
  Filled 2018-12-27: qty 1

## 2018-12-27 MED ORDER — METHYLPREDNISOLONE SODIUM SUCC 125 MG IJ SOLR
125.0000 mg | Freq: Once | INTRAMUSCULAR | Status: AC
  Administered 2018-12-27: 125 mg via INTRAVENOUS
  Filled 2018-12-27: qty 2

## 2018-12-27 NOTE — ED Notes (Signed)
States she has been unable to bear wt  Right ankle red and swollen

## 2018-12-27 NOTE — Progress Notes (Signed)
Cancelled & rescheduled due to recent fall with acute knee pain where she states she cannot get on procedure table for treatment.

## 2018-12-27 NOTE — ED Triage Notes (Signed)
Patient presents to ED via POV from home with c/o right ankle pain. Patient reports being seen here a week ago "but they only looked at by back not my ankle". Edema noted to ankle. Patient reports she is not able to walk.

## 2018-12-27 NOTE — Discharge Instructions (Signed)
You can take two more doses of colchicine today. You can begin prednisone tomorrow.

## 2018-12-28 ENCOUNTER — Telehealth: Payer: Self-pay | Admitting: *Deleted

## 2018-12-28 NOTE — ED Provider Notes (Signed)
[facility] Emergency Department Provider Note  ____________________________________________  Time seen: Approximately 7:29 AM  I have reviewed the triage vital signs and the nursing notes.   HISTORY  Chief Complaint Ankle Pain    HPI Chelsea Ramsey is a 53 y.o. female  that presents to the emergency department for evaluation of ankle pain for 1 week. Patient fell about 1 week ago and was more concerned about the back and the knee at the time.  Patient states that it was not until about 2 days after the fall when her right ankle started to swell and become painful.  Patient states that it has been too painful to bear weight.  Swelling is to the outside of her ankle.  Patient went to her pain doctor today and was unable to get her pain injections due to ankle and was told to come to the emergency department.  Patient has had gout before but in her left great toe.  No wounds.  No fever, chills.   Past Medical History:  Diagnosis Date  . Allergy    seasonal  . Anxiety   . Arthritis    Right Knee  . Asthma   . CHF (congestive heart failure) ([place])   . COPD (chronic obstructive pulmonary disease) ([place])   . Coronary artery disease    Leaky heart valve  . Fibromyalgia   . GERD (gastroesophageal reflux disease)   . Hypertension   . Pneumonia   . PUD (peptic ulcer disease)   . Pulmonary HTN ([place])   . Rheumatic fever/heart disease   . Sleep apnea     Patient Active Problem List   Diagnosis Date Noted  . Spondylosis without myelopathy or radiculopathy, lumbar region 12/27/2018  . Lumbar facet syndrome (Bilateral) 12/27/2018  . DDD (degenerative disc disease), lumbar 12/27/2018  . Chronic low back pain (Primary Area of Pain) (Bilateral) (L>R) w/o sciatica 12/27/2018  . Abnormal MRI, lumbar spine (01/05/2018) 12/27/2018  . Elevated sed rate 12/19/2018  . Elevated C-reactive protein 12/19/2018  . Diplopia 12/14/2018  . Dizziness 12/14/2018  . Headache,  chronic daily 12/14/2018  . Numbness and tingling of both feet 12/14/2018  . CHF (congestive heart failure) ([place]) 12/04/2018  . CAP (community acquired pneumonia) 12/01/2018  . Class 3 severe obesity with body mass index (BMI) of 45.0 to 49.9 in adult ([place]) 11/14/2018  . Chronic anticoagulation (Xarelto) 11/14/2018  . Vitamin D deficiency 11/14/2018  . Chronic knee pain after total replacement (Right) 11/14/2018  . Chest pain 10/17/2018  . Chronic low back pain (Primary Area of Pain) (Bilateral) w/ sciatica (Left) 10/10/2018  . Chronic lower extremity pain (Secondary Area of Pain) (Left) 10/10/2018  . Sternal pain (Tertiary Area of Pain) 10/10/2018  . Fibromyalgia (Fourth Area of Pain) 10/10/2018  . Chronic knee pain (Fifth Area of Pain) (Right) 10/10/2018  . Chronic sacroiliac joint pain 10/10/2018  . Chronic pain syndrome 10/10/2018  . Long term current use of opiate analgesic 10/10/2018  . Pharmacologic therapy 10/10/2018  . Disorder of skeletal system 10/10/2018  . Problems influencing health status 10/10/2018  . Chronic diastolic heart failure ([place]) 07/26/2018  . HTN (hypertension) 07/26/2018  . Acute CHF (congestive heart failure) ([place]) 07/17/2018  . Melena   . Nausea and vomiting   . Acute esophagogastric ulcer   . Intractable vomiting with nausea   . GIB (gastrointestinal bleeding) 05/22/2018  . A-fib ([place]) 04/22/2018  . COPD (chronic obstructive pulmonary disease) ([place]) 12/21/2017  . COPD (chronic obstructive  pulmonary disease) with acute bronchitis ([place]) 12/20/2017  . Osteoarthritis of knee (Right) 09/04/2016    Past Surgical History:  Procedure Laterality Date  . ADENOIDECTOMY    . ESOPHAGOGASTRODUODENOSCOPY (EGD) WITH PROPOFOL N/A 05/25/2018   Procedure: ESOPHAGOGASTRODUODENOSCOPY (EGD) WITH PROPOFOL;  Surgeon: Lucilla Lame, MD;  Location: [facility] ENDOSCOPY;  Service: Endoscopy;  Laterality: N/A;  . MITRAL VALVE REPLACEMENT    . MULTIPLE TOOTH EXTRACTIONS    .  TONSILLECTOMY    . TOTAL KNEE ARTHROPLASTY Right 09/04/2016   Procedure: TOTAL KNEE ARTHROPLASTY; with lateral release;  Surgeon: Earlie Server, MD;  Location: [place];  Service: Orthopedics;  Laterality: Right;    Prior to Admission medications   Medication Sig Start Date End Date Taking? Authorizing Provider  ALPRAZolam (XANAX) 0.25 MG tablet Take 1 tablet (0.25 mg total) by mouth daily as needed for anxiety. 12/06/18   Vaughan Basta, MD  bisacodyl (DULCOLAX) 5 MG EC tablet Take 2 tablets (10 mg total) by mouth daily as needed for moderate constipation. 07/21/18   Epifanio Lesches, MD  budesonide-formoterol (SYMBICORT) 160-4.5 MCG/ACT inhaler Inhale 2 puffs into the lungs 2 (two) times daily. 02/26/16   [provider]  butalbital-acetaminophen-caffeine (FIORICET, ESGIC) 50-325-40 MG tablet Take 1 tablet by mouth as needed.    [provider]  Calcium Carbonate-Vit D-Min (GNP CALCIUM 1200) 1200-1000 MG-UNIT CHEW Chew 1,200 mg by mouth daily with breakfast. Take in combination with vitamin D and magnesium. 11/14/18 05/13/19  Milinda Pointer, MD  Cholecalciferol (VITAMIN D3) 125 MCG (5000 UT) CAPS Take 1 capsule (5,000 Units total) by mouth daily with breakfast. Take along with calcium and magnesium. 11/14/18 05/13/19  Milinda Pointer, MD  colchicine 0.6 MG tablet Take 1 tablet (0.6 mg total) by mouth 2 (two) times daily. 12/27/18 12/27/19  Laban Emperor, PA-C  furosemide (LASIX) 40 MG tablet Take 1 tablet (40 mg total) by mouth 2 (two) times daily. 12/06/18 12/06/19  Vaughan Basta, MD  gabapentin (NEURONTIN) 600 MG tablet Take 0.5 tablets (300 mg total) by mouth 2 (two) times daily. Patient taking differently: Take 600 mg by mouth 3 (three) times daily.  12/06/18   Vaughan Basta, MD  guaiFENesin (MUCINEX) 600 MG 12 hr tablet Take 1 tablet (600 mg total) by mouth 2 (two) times daily. 11/19/18   Salary, Avel Peace, MD  ipratropium-albuterol (DUONEB) 0.5-2.5 (3)  MG/3ML SOLN Inhale 3 mLs into the lungs 3 (three) times daily as needed (respiratory).  02/26/16 12/20/18  [provider]  lactulose (CHRONULAC) 10 GM/15ML solution Take 45 mLs (30 g total) by mouth 2 (two) times daily as needed for mild constipation. 07/21/18   Epifanio Lesches, MD  magnesium oxide (MAG-OX) 400 MG tablet Take 400 mg by mouth 2 (two) times daily. 07/11/18 07/11/19  [provider]  meclizine (ANTIVERT) 12.5 MG tablet Take 25 mg by mouth 3 (three) times daily as needed for dizziness.    [provider]  metolazone (ZAROXOLYN) 2.5 MG tablet Take 1 tablet (2.5 mg total) by mouth daily. 12/14/18 03/14/19  Alisa Graff, FNP  metoprolol tartrate (LOPRESSOR) 100 MG tablet Take 1 tablet (100 mg total) by mouth 2 (two) times daily. 12/21/18   Alisa Graff, FNP  nicotine (NICODERM CQ - DOSED IN MG/24 HR) 7 mg/24hr patch Place 1 patch (7 mg total) onto the skin daily. 11/06/18   Bettey Costa, MD  nitroGLYCERIN (NITROSTAT) 0.4 MG SL tablet Place 1 tablet (0.4 mg total) under the tongue every 5 (five) minutes as needed for  chest pain. 11/05/18   Bettey Costa, MD  nortriptyline (PAMELOR) 10 MG capsule Take 10 mg by mouth Nightly. 12/14/18   [provider]  omeprazole (PRILOSEC) 40 MG capsule Take 40 mg by mouth 2 (two) times daily.    [provider]  Oxycodone HCl 10 MG TABS Take 0.5-1 tablets (5-10 mg total) by mouth 2 (two) times daily as needed for up to 30 days. Must last 30 days. MAX.: 2/day 12/21/18 01/20/19  Milinda Pointer, MD  potassium chloride SA (K-DUR,KLOR-CON) 20 MEQ tablet Take 1 tablet (20 mEq total) by mouth daily. And additional tablet when taking metolazone 12/12/18   Darylene Price A, FNP  predniSONE (DELTASONE) 10 MG tablet Take 6 tablets day 1, take 5 tablets day 2, take 4 tablets day 3, take 3 tablets day 4, take 2 tablets day 5, take 1 tablet on day 6 12/27/18   Laban Emperor, PA-C  promethazine (PHENERGAN) 12.5 MG tablet Take 1  tablet (12.5 mg total) by mouth every 6 (six) hours as needed for nausea or vomiting. 11/06/18   Bettey Costa, MD  rivaroxaban (XARELTO) 20 MG TABS tablet Take 20 mg by mouth every evening.  07/11/18 07/11/19  [provider]  tiZANidine (ZANAFLEX) 4 MG tablet Take 4 mg by mouth at bedtime.  08/10/18   [provider]  topiramate (TOPAMAX) 25 MG tablet Take 25 mg by mouth 2 (two) times daily.  08/10/18   [provider]    Allergies Amiodarone; Aspirin; Flexeril [cyclobenzaprine]; Trazamine [trazodone & diet manage prod]; Codeine; and Tramadol  Family History  Problem Relation Age of Onset  . COPD Mother   . Cancer Mother        Bone  . Asthma Mother   . Congestive Heart Failure Father     Social History Social History   Tobacco Use  . Smoking status: Current Every Day Smoker    Packs/day: 0.50    Types: Cigarettes  . Smokeless tobacco: Never Used  Substance Use Topics  . Alcohol use: Not Currently    Alcohol/week: 3.0 standard drinks    Types: 3 Cans of beer per week    Comment: 16 oz per week  . Drug use: Not Currently    Comment: Previous use of cocaine and marijuana last use 07/31/16      Review of Systems  Constitutional: No fever/chills ENT: No upper respiratory complaints. Cardiovascular: No chest pain. Respiratory: No cough. No SOB. Gastrointestinal: No abdominal pain.  No nausea, no vomiting.  Musculoskeletal: Positive for ankle pain. Skin: Negative for abrasions, lacerations, ecchymosis.  Positive for rash. Neurological: Negative for headaches, numbness or tingling   ____________________________________________   PHYSICAL EXAM:  VITAL SIGNS: ED Triage Vitals [12/27/18 1327]  Enc Vitals Group     BP 101/80     Pulse Rate 92     Resp 17     Temp 98.2 F (36.8 C)     Temp Source Oral     SpO2 98 %     Weight 270 lb (122.5 kg)     Height 5\' 4"  (1.626 m)     Head Circumference      Peak Flow      Pain Score 10     Pain Loc       Pain Edu?      Excl. in [place]?      Constitutional: Alert and oriented. Well appearing and in no acute distress. Eyes: Conjunctivae are normal. PERRL. EOMI. Head: Atraumatic. ENT:  Ears:      Nose: No congestion/rhinnorhea.      Mouth/Throat: Mucous membranes are moist.  Neck: No stridor.   Cardiovascular: Normal rate, regular rhythm.  Good peripheral circulation. Respiratory: Normal respiratory effort without tachypnea or retractions. Lungs CTAB. Good air entry to the bases with no decreased or absent breath sounds. Musculoskeletal: Full range of motion to all extremities. No gross deformities appreciated.  Swelling, erythema, warmth to right lateral malleolus.  Swelling and erythema does not extend into the calf, medial malleolus, right foot.  No pitting edema.  Limited range of motion of right ankle due to pain. Neurologic:  Normal speech and language. No gross focal neurologic deficits are appreciated.  Skin:  Skin is warm, dry and intact. No rash noted. Psychiatric: Mood and affect are normal. Speech and behavior are normal. Patient exhibits appropriate insight and judgement.   ____________________________________________   LABS (all labs ordered are listed, but only abnormal results are displayed)  Labs Reviewed  CBC - Abnormal; Notable for the following components:      Result Value   Hemoglobin 10.2 (*)    HCT 33.3 (*)    MCH 25.1 (*)    All other components within normal limits  BASIC METABOLIC PANEL - Abnormal; Notable for the following components:   Calcium 8.8 (*)    All other components within normal limits  URIC ACID - Abnormal; Notable for the following components:   Uric Acid, Serum 10.3 (*)    All other components within normal limits   ____________________________________________  EKG   ____________________________________________  RADIOLOGY Robinette Haines, personally viewed and evaluated these images (plain radiographs) as part of my medical  decision making, as well as reviewing the written report by the radiologist.  Dg Ankle Complete Right  Result Date: 12/27/2018 CLINICAL DATA:  RIGHT ankle pain for a week, unable to walk. History of RIGHT knee arthroplasty. EXAM: RIGHT ANKLE - COMPLETE 3+ VIEW COMPARISON:  None. FINDINGS: No fracture deformity nor dislocation. The ankle mortise appears congruent and the tibiofibular syndesmosis intact. Small plantar calcaneal spur. No destructive bony lesions. Soft tissue swelling without subcutaneous gas or radiopaque foreign bodies. Small joint effusion most apparent within the anterior tibiotalar space. IMPRESSION: 1. Soft tissue swelling without acute osseous process. Electronically Signed   By: Elon Alas M.D.   On: 12/27/2018 14:16    ____________________________________________    PROCEDURES  Procedure(s) performed:    Procedures    Medications  colchicine tablet 0.6 mg (0.6 mg Oral Given 12/27/18 1554)  oxyCODONE-acetaminophen (PERCOCET/ROXICET) 5-325 MG per tablet 1 tablet (1 tablet Oral Given 12/27/18 1554)  methylPREDNISolone sodium succinate (SOLU-MEDROL) 125 mg/2 mL injection 125 mg (125 mg Intravenous Given 12/27/18 1557)     ____________________________________________   INITIAL IMPRESSION / ASSESSMENT AND PLAN / ED COURSE  Pertinent labs & imaging results that were available during my care of the patient were reviewed by me and considered in my medical decision making (see chart for details).  Review of the [place] CSRS was performed in accordance of the [place] prior to dispensing any controlled drugs.   Patient's diagnosis is consistent with gout.  Patient states that ankle swelling happened a couple of days after a fall.  Exam and history were not entirely consistent with injury since swelling and pain started several days after fall so blood work was ordered.  Uric acid elevated at 10.3.  X-ray consistent with soft tissue swelling and no acute osseous process.   Patient was given a  dose of Solu-Medrol and colchicine.  Patient is requesting a splint.  Splint was placed.  Gilford Rile was given.  No additional complaints or concerns at this time.  Patient will be discharged home with prescriptions for colchicine and prednisone.  She is unable to take NSAIDs.  Patient is to follow up with primary care as directed. Patient is given ED precautions to return to the ED for any worsening or new symptoms.     ____________________________________________  FINAL CLINICAL IMPRESSION(S) / ED DIAGNOSES  Final diagnoses:  Acute gout of right ankle, unspecified cause      NEW MEDICATIONS STARTED DURING THIS VISIT:  ED Discharge Orders         Ordered    colchicine 0.6 MG tablet  2 times daily     12/27/18 1548    predniSONE (DELTASONE) 10 MG tablet     12/27/18 1548              This chart was dictated using voice recognition software/Dragon. Despite best efforts to proofread, errors can occur which can change the meaning. Any change was purely unintentional.    Laban Emperor, PA-C 12/28/18 2800    Arta Silence, MD 12/29/18 [phone]

## 2018-12-29 ENCOUNTER — Other Ambulatory Visit: Payer: Self-pay | Admitting: Internal Medicine

## 2018-12-29 DIAGNOSIS — Z1231 Encounter for screening mammogram for malignant neoplasm of breast: Secondary | ICD-10-CM

## 2019-01-03 DIAGNOSIS — K921 Melena: Secondary | ICD-10-CM

## 2019-01-03 DIAGNOSIS — K253 Acute gastric ulcer without hemorrhage or perforation: Secondary | ICD-10-CM

## 2019-01-05 ENCOUNTER — Encounter: Payer: Self-pay | Admitting: Pain Medicine

## 2019-01-05 ENCOUNTER — Ambulatory Visit: Payer: Medicaid Other | Admitting: Pain Medicine

## 2019-01-05 ENCOUNTER — Ambulatory Visit
Admission: RE | Admit: 2019-01-05 | Discharge: 2019-01-05 | Disposition: A | Discharge status: Home / Self Care | Payer: Medicaid Other | Source: Ambulatory Visit | Attending: Pain Medicine | Admitting: Pain Medicine

## 2019-01-05 ENCOUNTER — Other Ambulatory Visit: Payer: Self-pay

## 2019-01-05 ENCOUNTER — Ambulatory Visit (HOSPITAL_BASED_OUTPATIENT_CLINIC_OR_DEPARTMENT_OTHER): Payer: Medicaid Other | Admitting: Pain Medicine

## 2019-01-05 VITALS — BP 103/69 | HR 83 | Temp 97.7°F | Resp 16 | Ht 64.0 in | Wt 270.0 lb

## 2019-01-05 DIAGNOSIS — G8929 Other chronic pain: Secondary | ICD-10-CM

## 2019-01-05 DIAGNOSIS — G894 Chronic pain syndrome: Secondary | ICD-10-CM | POA: Diagnosis present

## 2019-01-05 DIAGNOSIS — M545 Low back pain: Secondary | ICD-10-CM | POA: Diagnosis present

## 2019-01-05 DIAGNOSIS — M47816 Spondylosis without myelopathy or radiculopathy, lumbar region: Secondary | ICD-10-CM | POA: Insufficient documentation

## 2019-01-05 DIAGNOSIS — Z7901 Long term (current) use of anticoagulants: Secondary | ICD-10-CM

## 2019-01-05 DIAGNOSIS — M5136 Other intervertebral disc degeneration, lumbar region: Secondary | ICD-10-CM | POA: Insufficient documentation

## 2019-01-05 MED ORDER — LIDOCAINE HCL 2 % IJ SOLN
20.0000 mL | Freq: Once | INTRAMUSCULAR | Status: AC
  Administered 2019-01-05: 400 mg
  Filled 2019-01-05: qty 40

## 2019-01-05 MED ORDER — ROPIVACAINE HCL 2 MG/ML IJ SOLN
18.0000 mL | Freq: Once | INTRAMUSCULAR | Status: AC
  Administered 2019-01-05: 18 mL via PERINEURAL
  Filled 2019-01-05: qty 20

## 2019-01-05 MED ORDER — OXYCODONE HCL 10 MG PO TABS: 5.0000 mg | ORAL_TABLET | Freq: Two times a day (BID) | ORAL | 0 refills | Status: DC | PRN

## 2019-01-05 MED ORDER — LACTATED RINGERS IV SOLN
1000.0000 mL | Freq: Once | INTRAVENOUS | Status: AC
  Administered 2019-01-05: 1000 mL via INTRAVENOUS

## 2019-01-05 MED ORDER — MIDAZOLAM HCL 5 MG/5ML IJ SOLN
1.0000 mg | INTRAMUSCULAR | Status: DC | PRN
  Administered 2019-01-05: 3 mg via INTRAVENOUS
  Filled 2019-01-05: qty 5

## 2019-01-05 MED ORDER — FENTANYL CITRATE (PF) 100 MCG/2ML IJ SOLN
25.0000 ug | INTRAMUSCULAR | Status: DC | PRN
  Administered 2019-01-05: 50 ug via INTRAVENOUS
  Filled 2019-01-05: qty 2

## 2019-01-05 NOTE — Patient Instructions (Addendum)
____________________________________________________________________________________________  Post-Procedure Discharge Instructions  Instructions:  Apply ice: Fill a plastic sandwich bag with crushed ice. Cover it with a small towel and apply to injection site. Apply for 15 minutes then remove x 15 minutes. Repeat sequence on day of procedure, until you go to bed. The purpose is to minimize swelling and discomfort after procedure.  Apply heat: Apply heat to procedure site starting the day following the procedure. The purpose is to treat any soreness and discomfort from the procedure.  Food intake: Start with clear liquids (like water) and advance to regular food, as tolerated.   Physical activities: Keep activities to a minimum for the first 8 hours after the procedure.   Driving: If you have received any sedation, you are not allowed to drive for 24 hours after your procedure.  Blood thinner: Restart your blood thinner 6 hours after your procedure. (Only for those taking blood thinners)  Insulin: As soon as you can eat, you may resume your normal dosing schedule. (Only for those taking insulin)  Infection prevention: Keep procedure site clean and dry.  Post-procedure Pain Diary: Extremely important that this be done correctly and accurately. Recorded information will be used to determine the next step in treatment.  Pain evaluated is that of treated area only. Do not include pain from an untreated area.  Complete every hour, on the hour, for the initial 8 hours. Set an alarm to help you do this part accurately.  Do not go to sleep and have it completed later. It will not be accurate.  Follow-up appointment: Keep your follow-up appointment after the procedure. Usually 2 weeks for most procedures. (6 weeks in the case of radiofrequency.) Bring you pain diary.   Expect:  From numbing medicine (AKA: Local Anesthetics): Numbness or decrease in pain.  Onset: Full effect within 15  minutes of injected.  Duration: It will depend on the type of local anesthetic used. On the average, 1 to 8 hours.   From steroids: Decrease in swelling or inflammation. Once inflammation is improved, relief of the pain will follow.  Onset of benefits: Depends on the amount of swelling present. The more swelling, the longer it will take for the benefits to be seen. In some cases, up to 10 days.  Duration: Steroids will stay in the system x 2 weeks. Duration of benefits will depend on multiple posibilities including persistent irritating factors.  Occasional side-effects: Facial flushing (red, warm cheeks) , cramps (if present, drink Gatorade and take over-the-counter Magnesium 450-500 mg once to twice a day).  From procedure: Some discomfort is to be expected once the numbing medicine wears off. This should be minimal if ice and heat are applied as instructed.  Call if:  You experience numbness and weakness that gets worse with time, as opposed to wearing off.  New onset bowel or bladder incontinence. (This applies to Spinal procedures only)  Emergency Numbers:  Durning business hours (Monday - Thursday, 8:00 AM - 4:00 PM) (Friday, 9:00 AM - 12:00 Noon): [phone]  After hours: [phone] ____________________________________________________________________________________________    ______________________________________________________________________________________________  Specialty Pain Scale  Introduction:  There are significant differences in how pain is reported. The word pain usually refers to physical pain, but it is also a common synonym of suffering. The medical community uses a scale from 0 (zero) to 10 (ten) to report pain level. Zero (0) is described as "no pain", while ten (10) is described as "the worse pain you can imagine". The problem with this   scale is that physical pain is reported along with suffering. Suffering refers to mental pain, or more often  yet it refers to any unpleasant feeling, emotion or aversion associated with the perception of harm or threat of harm. It is the psychological component of pain.  Pain Specialists prefer to separate the two components. The pain scale used by this practice is the Verbal Numerical Rating Scale (VNRS-11). This scale is for the physical pain only. DO NOT INCLUDE how your pain psychologically affects you. This scale is for adults 21 years of age and older. It has 11 (eleven) levels. The 1st level is 0/10. This means: "right now, I have no pain". In the context of pain management, it also means: "right now, my physical pain is under control with the current therapy".  General Information:  The scale should reflect your current level of pain. Unless you are specifically asked for the level of your worst pain, or your average pain. If you are asked for one of these two, then it should be understood that it is over the past 24 hours.  Levels 1 (one) through 5 (five) are described below, and can be treated as an outpatient. Ambulatory pain management facilities such as ours are more than adequate to treat these levels. Levels 6 (six) through 10 (ten) are also described below, however, these must be treated as a hospitalized patient. While levels 6 (six) and 7 (seven) may be evaluated at an urgent care facility, levels 8 (eight) through 10 (ten) constitute medical emergencies and as such, they belong in a hospital's emergency department. When having these levels (as described below), do not come to our office. Our facility is not equipped to manage these levels. Go directly to an urgent care facility or an emergency department to be evaluated.  Definitions:  Activities of Daily Living (ADL): Activities of daily living (ADL or ADLs) is a term used in healthcare to refer to people's daily self-care activities. Health professionals often use a person's ability or inability to perform ADLs as a measurement of their  functional status, particularly in regard to people post injury, with disabilities and the elderly. There are two ADL levels: Basic and Instrumental. Basic Activities of Daily Living (BADL  or BADLs) consist of self-care tasks that include: Bathing and showering; personal hygiene and grooming (including brushing/combing/styling hair); dressing; Toilet hygiene (getting to the toilet, cleaning oneself, and getting back up); eating and self-feeding (not including cooking or chewing and swallowing); functional mobility, often referred to as "transferring", as measured by the ability to walk, get in and out of bed, and get into and out of a chair; the broader definition (moving from one place to another while performing activities) is useful for people with different physical abilities who are still able to get around independently. Basic ADLs include the things many people do when they get up in the morning and get ready to go out of the house: get out of bed, go to the toilet, bathe, dress, groom, and eat. On the average, loss of function typically follows a particular order. Hygiene is the first to go, followed by loss of toilet use and locomotion. The last to go is the ability to eat. When there is only one remaining area in which the person is independent, there is a 62.9% chance that it is eating and only a 3.5% chance that it is hygiene. Instrumental Activities of Daily Living (IADL or IADLs) are not necessary for fundamental functioning, but they let an   individual live independently in a community. IADL consist of tasks that include: cleaning and maintaining the house; home establishment and maintenance; care of others (including selecting and supervising caregivers); care of pets; child rearing; managing money; managing financials (investments, etc.); meal preparation and cleanup; shopping for groceries and necessities; moving within the community; safety procedures and emergency responses; health management  and maintenance (taking prescribed medications); and using the telephone or other form of communication.  Instructions:  Most patients tend to report their pain as a combination of two factors, their physical pain and their psychosocial pain. This last one is also known as "suffering" and it is reflection of how physical pain affects you socially and psychologically. From now on, report them separately.  From this point on, when asked to report your pain level, report only your physical pain. Use the following table for reference.  Pain Clinic Pain Levels (0-5/10)  Pain Level Score  Description  No Pain 0   Mild pain 1 Nagging, annoying, but does not interfere with basic activities of daily living (ADL). Patients are able to eat, bathe, get dressed, toileting (being able to get on and off the toilet and perform personal hygiene functions), transfer (move in and out of bed or a chair without assistance), and maintain continence (able to control bladder and bowel functions). Blood pressure and heart rate are unaffected. A normal heart rate for a healthy adult ranges from 60 to 100 bpm (beats per minute).   Mild to moderate pain 2 Noticeable and distracting. Impossible to hide from other people. More frequent flare-ups. Still possible to adapt and function close to normal. It can be very annoying and may have occasional stronger flare-ups. With discipline, patients may get used to it and adapt.   Moderate pain 3 Interferes significantly with activities of daily living (ADL). It becomes difficult to feed, bathe, get dressed, get on and off the toilet or to perform personal hygiene functions. Difficult to get in and out of bed or a chair without assistance. Very distracting. With effort, it can be ignored when deeply involved in activities.   Moderately severe pain 4 Impossible to ignore for more than a few minutes. With effort, patients may still be able to manage work or participate in some social  activities. Very difficult to concentrate. Signs of autonomic nervous system discharge are evident: dilated pupils (mydriasis); mild sweating (diaphoresis); sleep interference. Heart rate becomes elevated (>115 bpm). Diastolic blood pressure (lower number) rises above 100 mmHg. Patients find relief in laying down and not moving.   Severe pain 5 Intense and extremely unpleasant. Associated with frowning face and frequent crying. Pain overwhelms the senses.  Ability to do any activity or maintain social relationships becomes significantly limited. Conversation becomes difficult. Pacing back and forth is common, as getting into a comfortable position is nearly impossible. Pain wakes you up from deep sleep. Physical signs will be obvious: pupillary dilation; increased sweating; goosebumps; brisk reflexes; cold, clammy hands and feet; nausea, vomiting or dry heaves; loss of appetite; significant sleep disturbance with inability to fall asleep or to remain asleep. When persistent, significant weight loss is observed due to the complete loss of appetite and sleep deprivation.  Blood pressure and heart rate becomes significantly elevated. Caution: If elevated blood pressure triggers a pounding headache associated with blurred vision, then the patient should immediately seek attention at an urgent or emergency care unit, as these may be signs of an impending stroke.    Emergency Department Pain Levels (6-10/10)  Emergency Room Pain 6 Severely limiting. Requires emergency care and should not be seen or managed at an outpatient pain management facility. Communication becomes difficult and requires great effort. Assistance to reach the emergency department may be required. Facial flushing and profuse sweating along with potentially dangerous increases in heart rate and blood pressure will be evident.   Distressing pain 7 Self-care is very difficult. Assistance is required to transport, or use restroom. Assistance to  reach the emergency department will be required. Tasks requiring coordination, such as bathing and getting dressed become very difficult.   Disabling pain 8 Self-care is no longer possible. At this level, pain is disabling. The individual is unable to do even the most "basic" activities such as walking, eating, bathing, dressing, transferring to a bed, or toileting. Fine motor skills are lost. It is difficult to think clearly.   Incapacitating pain 9 Pain becomes incapacitating. Thought processing is no longer possible. Difficult to remember your own name. Control of movement and coordination are lost.   The worst pain imaginable 10 At this level, most patients pass out from pain. When this level is reached, collapse of the autonomic nervous system occurs, leading to a sudden drop in blood pressure and heart rate. This in turn results in a temporary and dramatic drop in blood flow to the brain, leading to a loss of consciousness. Fainting is one of the body's self defense mechanisms. Passing out puts the brain in a calmed state and causes it to shut down for a while, in order to begin the healing process.    Summary: 1. Refer to this scale when providing [place] with your pain level. 2. Be accurate and careful when reporting your pain level. This will help with your care. 3. Over-reporting your pain level will lead to loss of credibility. 4. Even a level of 1/10 means that there is pain and will be treated at our facility. 5. High, inaccurate reporting will be documented as "Symptom Exaggeration", leading to loss of credibility and suspicions of possible secondary gains such as obtaining more narcotics, or wanting to appear disabled, for fraudulent reasons. 6. Only pain levels of 5 or below will be seen at our facility. 7. Pain levels of 6 and above will be sent to the Emergency Department and the appointment  cancelled. ______________________________________________________________________________________________   ____________________________________________________________________________________________  Medication Rules  Purpose: To inform patients, and their family members, of our rules and regulations.  Applies to: All patients receiving prescriptions (written or electronic).  Pharmacy of record: Pharmacy where electronic prescriptions will be sent. If written prescriptions are taken to a different pharmacy, please inform the nursing staff. The pharmacy listed in the electronic medical record should be the one where you would like electronic prescriptions to be sent.  Electronic prescriptions: In compliance with the [place] (STOP[place] Act of 2017 (Session Lanny Cramp [phone]), effective November 30, 2018, all controlled substances must be electronically prescribed. Calling prescriptions to the pharmacy will cease to exist.  Prescription refills: Only during scheduled appointments. Applies to all prescriptions.  NOTE: The following applies primarily to controlled substances (Opioid* Pain Medications).   Patient's responsibilities: 1. Pain Pills: Bring all pain pills to every appointment (except for procedure appointments). 2. Pill Bottles: Bring pills in original pharmacy bottle. Always bring the newest bottle. Bring bottle, even if empty. 3. Medication refills: You are responsible for knowing and keeping track of what medications you take and those you need refilled. The day before your appointment: write  a list of all prescriptions that need to be refilled. The day of the appointment: give the list to the admitting nurse. Prescriptions will be written only during appointments. No prescriptions will be written on procedure days. If you forget a medication: it will not be "Called in", "Faxed", or "electronically sent". You will need to get another  appointment to get these prescribed. No early refills. Do not call asking to have your prescription filled early. 4. Prescription Accuracy: You are responsible for carefully inspecting your prescriptions before leaving our office. Have the discharge nurse carefully go over each prescription with you, before taking them home. Make sure that your name is accurately spelled, that your address is correct. Check the name and dose of your medication to make sure it is accurate. Check the number of pills, and the written instructions to make sure they are clear and accurate. Make sure that you are given enough medication to last until your next medication refill appointment. 5. Taking Medication: Take medication as prescribed. When it comes to controlled substances, taking less pills or less frequently than prescribed is permitted and encouraged. Never take more pills than instructed. Never take medication more frequently than prescribed.  6. Inform other Doctors: Always inform, all of your healthcare providers, of all the medications you take. 7. Pain Medication from other Providers: You are not allowed to accept any additional pain medication from any other Doctor or Healthcare provider. There are two exceptions to this rule. (see below) In the event that you require additional pain medication, you are responsible for notifying us, as stated below. 8. Medication Agreement: You are responsible for carefully reading and following our Medication Agreement. This must be signed before receiving any prescriptions from our practice. Safely store a copy of your signed Agreement. Violations to the Agreement will result in no further prescriptions. (Additional copies of our Medication Agreement are available upon request.) 9. Laws, Rules, & Regulations: All patients are expected to follow all Federal and [employer], Rules, [employer]. Ignorance of the Laws does not constitute a valid excuse. The use of any  illegal substances is prohibited. 10. Adopted CDC guidelines & recommendations: Target dosing levels will be at or below 60 MME/day. Use of benzodiazepines** is not recommended.  Exceptions: There are only two exceptions to the rule of not receiving pain medications from other Healthcare Providers. 1. Exception #1 (Emergencies): In the event of an emergency (i.e.: accident requiring emergency care), you are allowed to receive additional pain medication. However, you are responsible for: As soon as you are able, call our office [phone], at any time of the day or night, and leave a message stating your name, the date and nature of the emergency, and the name and dose of the medication prescribed. In the event that your call is answered by a member of our staff, make sure to document and save the date, time, and the name of the person that took your information.  2. Exception #2 (Planned Surgery): In the event that you are scheduled by another doctor or dentist to have any type of surgery or procedure, you are allowed (for a period no longer than 30 days), to receive additional pain medication, for the acute post-op pain. However, in this case, you are responsible for picking up a copy of our "Post-op Pain Management for Surgeons" handout, and giving it to your surgeon or dentist. This document is available at our office, and does not require an appointment to obtain it.  Simply go to our office during business hours (Monday-Thursday from 8:00 AM to 4:00 PM) (Friday 8:00 AM to 12:00 Noon) or if you have a scheduled appointment with [place], prior to your surgery, and ask for it by name. In addition, you will need to provide [place] with your name, name of your surgeon, type of surgery, and date of procedure or surgery.  *Opioid medications include: morphine, codeine, oxycodone, oxymorphone, hydrocodone, hydromorphone, meperidine, tramadol, tapentadol, buprenorphine, fentanyl, methadone. **Benzodiazepine medications  include: diazepam (Valium), alprazolam (Xanax), clonazepam (Klonopine), lorazepam (Ativan), clorazepate (Tranxene), chlordiazepoxide (Librium), estazolam (Prosom), oxazepam (Serax), temazepam (Restoril), triazolam (Halcion) (Last updated: 01/27/2018) ____________________________________________________________________________________________   ____________________________________________________________________________________________  Medication Recommendations and Reminders  Applies to: All patients receiving prescriptions (written and/or electronic).  Medication Rules & Regulations: These rules and regulations exist for your safety and that of others. They are not flexible and neither are we. Dismissing or ignoring them will be considered "non-compliance" with medication therapy, resulting in complete and irreversible termination of such therapy. (See document titled "Medication Rules" for more details.) In all conscience, because of safety reasons, we cannot continue providing a therapy where the patient does not follow instructions.  Pharmacy of record:   Definition: This is the pharmacy where your electronic prescriptions will be sent.   We do not endorse any particular pharmacy.  You are not restricted in your choice of pharmacy.  The pharmacy listed in the electronic medical record should be the one where you want electronic prescriptions to be sent.  If you choose to change pharmacy, simply notify our nursing staff of your choice of new pharmacy.  Recommendations:  Keep all of your pain medications in a safe place, under lock and key, even if you live alone.   After you fill your prescription, take 1 week's worth of pills and put them away in a safe place. You should keep a separate, properly labeled bottle for this purpose. The remainder should be kept in the original bottle. Use this as your primary supply, until it runs out. Once it's gone, then you know that you have 1  week's worth of medicine, and it is time to come in for a prescription refill. If you do this correctly, it is unlikely that you will ever run out of medicine.  To make sure that the above recommendation works, it is very important that you make sure your medication refill appointments are scheduled at least 1 week before you run out of medicine. To do this in an effective manner, make sure that you do not leave the office without scheduling your next medication management appointment. Always ask the nursing staff to show you in your prescription , when your medication will be running out. Then arrange for the receptionist to get you a return appointment, at least 7 days before you run out of medicine. Do not wait until you have 1 or 2 pills left, to come in. This is very poor planning and does not take into consideration that we may need to cancel appointments due to bad weather, sickness, or emergencies affecting our staff.  "Partial Fill": If for any reason your pharmacy does not have enough pills/tablets to completely fill or refill your prescription, do not allow for a "partial fill". You will need a separate prescription to fill the remaining amount, which we will not provide. If the reason for the partial fill is your insurance, you will need to talk to the pharmacist about payment alternatives for the remaining tablets, but again, do  not accept a partial fill.  Prescription refills and/or changes in medication(s):   Prescription refills, and/or changes in dose or medication, will be conducted only during scheduled medication management appointments. (Applies to both, written and electronic prescriptions.)  No refills on procedure days. No medication will be changed or started on procedure days. No changes, adjustments, and/or refills will be conducted on a procedure day. Doing so will interfere with the diagnostic portion of the procedure.  No phone refills. No medications will be "called into  the pharmacy".  No Fax refills.  No weekend refills.  No Holliday refills.  No after hours refills.  Remember:  Business hours are:  Monday to Thursday 8:00 AM to 4:00 PM Provider's Schedule: Dionisio David, NP - Appointments are:  Medication management: Monday to Thursday 8:00 AM to 4:00 PM Milinda Pointer, MD - Appointments are:  Medication management: Monday and Wednesday 8:00 AM to 4:00 PM Procedure day: Tuesday and Thursday 7:30 AM to 4:00 PM Gillis Santa, MD - Appointments are:  Medication management: Tuesday and Thursday 8:00 AM to 4:00 PM Procedure day: Monday and Wednesday 7:30 AM to 4:00 PM (Last update: 01/27/2018) ____________________________________________________________________________________________   Pain Management Discharge Instructions  General Discharge Instructions :  If you need to reach your doctor call: Monday-Friday 8:00 am - 4:00 pm at [phone] or toll [phone].  After clinic hours [phone] to have operator reach doctor.  Bring all of your medication bottles to all your appointments in the pain clinic.  To cancel or reschedule your appointment with Pain Management please remember to call 24 hours in advance to avoid a fee.  Refer to the educational materials which you have been given on: General Risks, I had my Procedure. Discharge Instructions, Post Sedation.  Post Procedure Instructions:  The drugs you were given will stay in your system until tomorrow, so for the next 24 hours you should not drive, make any legal decisions or drink any alcoholic beverages.  You may eat anything you prefer, but it is better to start with liquids then soups and crackers, and gradually work up to solid foods.  Please notify your doctor immediately if you have any unusual bleeding, trouble breathing or pain that is not related to your normal pain.  Depending on the type of procedure that was done, some parts of your body may feel week and/or  numb.  This usually clears up by tonight or the next day.  Walk with the use of an assistive device or accompanied by an adult for the 24 hours.  You may use ice on the affected area for the first 24 hours.  Put ice in a Ziploc bag and cover with a towel and place against area 15 minutes on 15 minutes off.  You may switch to heat after 24 hours.Facet Blocks Patient Information  Description: The facets are joints in the spine between the vertebrae.  Like any joints in the body, facets can become irritated and painful.  Arthritis can also effect the facets.  By injecting steroids and local anesthetic in and around these joints, we can temporarily block the nerve supply to them.  Steroids act directly on irritated nerves and tissues to reduce selling and inflammation which often leads to decreased pain.  Facet blocks may be done anywhere along the spine from the neck to the low back depending upon the location of your pain.   After numbing the skin with local anesthetic (like Novocaine), a small needle is passed onto the facet  joints under x-ray guidance.  You may experience a sensation of pressure while this is being done.  The entire block usually lasts about 15-25 minutes.   Conditions which may be treated by facet blocks:   Low back/buttock pain  Neck/shoulder pain  Certain types of headaches  Preparation for the injection:  1. Do not eat any solid food or dairy products within 8 hours of your appointment. 2. You may drink clear liquid up to 3 hours before appointment.  Clear liquids include water, black coffee, juice or soda.  No milk or cream please. 3. You may take your regular medication, including pain medications, with a sip of water before your appointment.  Diabetics should hold regular insulin (if taken separately) and take 1/2 normal NPH dose the morning of the procedure.  Carry some sugar containing items with you to your appointment. 4. A driver must accompany you and be prepared  to drive you home after your procedure. 5. Bring all your current medications with you. 6. An IV may be inserted and sedation may be given at the discretion of the physician. 7. A blood pressure cuff, EKG and other monitors will often be applied during the procedure.  Some patients may need to have extra oxygen administered for a short period. 8. You will be asked to provide medical information, including your allergies and medications, prior to the procedure.  We must know immediately if you are taking blood thinners (like Coumadin/Warfarin) or if you are allergic to IV iodine contrast (dye).  We must know if you could possible be pregnant.  Possible side-effects:   Bleeding from needle site  Infection (rare, may require surgery)  Nerve injury (rare)  Numbness & tingling (temporary)  Difficulty urinating (rare, temporary)  Spinal headache (a headache worse with upright posture)  Light-headedness (temporary)  Pain at injection site (serveral days)  Decreased blood pressure (rare, temporary)  Weakness in arm/leg (temporary)  Pressure sensation in back/neck (temporary)   Call if you experience:   Fever/chills associated with headache or increased back/neck pain  Headache worsened by an upright position  New onset, weakness or numbness of an extremity below the injection site  Hives or difficulty breathing (go to the emergency room)  Inflammation or drainage at the injection site(s)  Severe back/neck pain greater than usual  New symptoms which are concerning to you  Please note:  Although the local anesthetic injected can often make your back or neck feel good for several hours after the injection, the pain will likely return. It takes 3-7 days for steroids to work.  You may not notice any pain relief for at least one week.  If effective, we will often do a series of 2-3 injections spaced 3-6 weeks apart to maximally decrease your pain.  After the initial series, you  may be a candidate for a more permanent nerve block of the facets.  If you have any questions, please call #[phone]) [facility]

## 2019-01-05 NOTE — Progress Notes (Signed)
Patient's Name: Chelsea Ramsey  MRN: [id number]  Referring Provider: Kathee Delton, MD  DOB: 06-20-1966  PCP: Kathee Delton, MD  DOS: 01/05/2019  Note by: Gaspar Cola, MD  Service setting: Ambulatory outpatient  Specialty: Interventional Pain Management  Patient type: Established  Location: [facility] (AMB) Pain Management Facility  Visit type: Interventional Procedure   Primary Reason for Visit: Interventional Pain Management Treatment. CC: Back Pain (low)  Procedure:          Anesthesia, Analgesia, Anxiolysis:  Type: Lumbar Facet, Medial Branch Block(s) #1 (NO STEROIDS) Primary Purpose: Diagnostic Region: Posterolateral Lumbosacral Spine Level: L2, L3, L4, L5, & S1 Medial Branch Level(s). Injecting these levels blocks the L3-4, L4-5, and L5-S1 lumbar facet joints. Laterality: Bilateral  Type: Moderate (Conscious) Sedation combined with Local Anesthesia Indication(s): Analgesia and Anxiety Route: Intravenous (IV) IV Access: Secured Sedation: Meaningful verbal contact was maintained at all times during the procedure  Local Anesthetic: Lidocaine 1-2%  Position: Prone   Indications: 1. Spondylosis without myelopathy or radiculopathy, lumbar region   2. Lumbar facet syndrome (Bilateral)   3. DDD (degenerative disc disease), lumbar   4. Chronic low back pain (Primary Area of Pain) (Bilateral) (L>R) w/o sciatica   5. Lumbar facet arthropathy (Bilateral)   6. Chronic anticoagulation (Xarelto)    Pain Score: Pre-procedure: 8 /10 Post-procedure: 6 /10  Controlled Substance Pharmacotherapy Assessment REMS (Risk Evaluation and Mitigation Strategy)  Analgesic: Oxycodone IR 10 mg 1 tablet p.o. twice daily MME/day: 30 mg/day.  Dewayne Shorter, RN  01/05/2019  8:07 AM  Signed Safety precautions to be maintained throughout the outpatient stay will include: orient to surroundings, keep bed in low position, maintain call bell within reach at all times, provide assistance with  transfer out of bed and ambulation.   Pharmacokinetics: Liberation and absorption (onset of action): WNL Distribution (time to peak effect): WNL Metabolism and excretion (duration of action): WNL         Pharmacodynamics: Desired effects: Analgesia: Ms. Caffee reports >50% benefit. Functional ability: Patient reports that medication allows her to accomplish basic ADLs Clinically meaningful improvement in function (CMIF): Sustained CMIF goals met Perceived effectiveness: Described as relatively effective, allowing for increase in activities of daily living (ADL) Undesirable effects: Side-effects or Adverse reactions: None reported Monitoring: [place] PMP: Online review of the past 79-monthperiod conducted. Compliant with practice rules and regulations Last UDS on record: Summary  Date Value Ref Range Status  12/19/2018 FINAL  Final    Comment:    ==================================================================== TOXASSURE SELECT 13 (MW) ==================================================================== Test                             Result       Flag       Units Drug Present   Oxycodone                      537                     ng/mg creat   Oxymorphone                    2075                    ng/mg creat   Noroxycodone                   1445  ng/mg creat   Noroxymorphone                 791                     ng/mg creat    Sources of oxycodone are scheduled prescription medications.    Oxymorphone, noroxycodone, and noroxymorphone are expected    metabolites of oxycodone. Oxymorphone is also available as a    scheduled prescription medication. ==================================================================== Test                      Result    Flag   Units      Ref Range   Creatinine              102              mg/dL      >=20 ==================================================================== Declared Medications:  Medication list was not  provided. ==================================================================== For clinical consultation, please call [phone]. ====================================================================    UDS interpretation: Compliant          Medication Assessment Form: Reviewed. Patient indicates being compliant with therapy Treatment compliance: Compliant Risk Assessment Profile: Aberrant behavior: See prior evaluations. None observed or detected today Comorbid factors increasing risk of overdose: See prior notes. No additional risks detected today Opioid risk tool (ORT) (Total Score):   Personal History of Substance Abuse (SUD-Substance use disorder):  Alcohol:    Illegal Drugs:    Rx Drugs:    ORT Risk Level calculation:   Risk of substance use disorder (SUD): Low  ORT Scoring interpretation table:  Score <3 = Low Risk for SUD  Score between 4-7 = Moderate Risk for SUD  Score >8 = High Risk for Opioid Abuse   Risk Mitigation Strategies:  Patient Counseling: Covered Patient-Prescriber Agreement (PPA): Present and active  Notification to other healthcare providers: Done  Pharmacologic Plan: No change in therapy, at this time.             Pre-op Assessment:  Ms. Bradburn is a 53 y.o. (year old), female patient, seen today for interventional treatment. 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 Ms. Charnley has a current medication list which includes the following prescription(s): alprazolam, bisacodyl, budesonide-formoterol, butalbital-acetaminophen-caffeine, gnp calcium 1200, vitamin d3, colchicine, furosemide, gabapentin, guaifenesin, lactulose, magnesium oxide, meclizine, metolazone, metoprolol tartrate, nitroglycerin, nortriptyline, omeprazole, potassium chloride sa,  promethazine, rivaroxaban, tizanidine, topiramate, ipratropium-albuterol, nicotine, oxycodone hcl, and prednisone, and the following Facility-Administered Medications: fentanyl, lactated ringers, and midazolam. Her primarily concern today is the Back Pain (low)  Initial Vital Signs:  Pulse/HCG Rate: 84ECG Heart Rate: 83 Temp: 97.7 F (36.5 C) Resp: 20 BP: 102/66 SpO2: 98 %  BMI: Estimated body mass index is 46.35 kg/m as calculated from the following:   Height as of this encounter: 5' 4"  (1.626 m).   Weight as of this encounter: 270 lb (122.5 kg).  Risk Assessment: Allergies: Reviewed. She is allergic to amiodarone; aspirin; flexeril [cyclobenzaprine]; trazamine [trazodone & diet manage prod]; codeine; and tramadol.  Allergy Precautions: None required Coagulopathies: Reviewed. None identified.  Blood-thinner therapy: None at this time Active Infection(s): Reviewed. None identified. Ms. Sponsel is afebrile  Site Confirmation: Ms. Offield was asked to confirm the procedure and laterality before marking the site Procedure checklist: Completed Consent: Before the procedure and under the influence of no  sedative(s), amnesic(s), or anxiolytics, the patient was informed of the treatment options, risks and possible complications. To fulfill our ethical and legal obligations, as recommended by the American Medical Association's Code of Ethics, I have informed the patient of my clinical impression; the nature and purpose of the treatment or procedure; the risks, benefits, and possible complications of the intervention; the alternatives, including doing nothing; the risk(s) and benefit(s) of the alternative treatment(s) or procedure(s); and the risk(s) and benefit(s) of doing nothing. The patient was provided information about the general risks and possible complications associated with the procedure. These may include, but are not limited to: failure to achieve desired goals, infection, bleeding,  organ or nerve damage, allergic reactions, paralysis, and death. In addition, the patient was informed of those risks and complications associated to Spine-related procedures, such as failure to decrease pain; infection (i.e.: Meningitis, epidural or intraspinal abscess); bleeding (i.e.: epidural hematoma, subarachnoid hemorrhage, or any other type of intraspinal or peri-dural bleeding); organ or nerve damage (i.e.: Any type of peripheral nerve, nerve root, or spinal cord injury) with subsequent damage to sensory, motor, and/or autonomic systems, resulting in permanent pain, numbness, and/or weakness of one or several areas of the body; allergic reactions; (i.e.: anaphylactic reaction); and/or death. Furthermore, the patient was informed of those risks and complications associated with the medications. These include, but are not limited to: allergic reactions (i.e.: anaphylactic or anaphylactoid reaction(s)); adrenal axis suppression; blood sugar elevation that in diabetics may result in ketoacidosis or comma; water retention that in patients with history of congestive heart failure may result in shortness of breath, pulmonary edema, and decompensation with resultant heart failure; weight gain; swelling or edema; medication-induced neural toxicity; particulate matter embolism and blood vessel occlusion with resultant organ, and/or nervous system infarction; and/or aseptic necrosis of one or more joints. Finally, the patient was informed that Medicine is not an exact science; therefore, there is also the possibility of unforeseen or unpredictable risks and/or possible complications that may result in a catastrophic outcome. The patient indicated having understood very clearly. We have given the patient no guarantees and we have made no promises. Enough time was given to the patient to ask questions, all of which were answered to the patient's satisfaction. Ms. Kopischke has indicated that she wanted to continue  with the procedure. Attestation: I, the ordering provider, attest that I have discussed with the patient the benefits, risks, side-effects, alternatives, likelihood of achieving goals, and potential problems during recovery for the procedure that I have provided informed consent. Date  Time: 01/05/2019  8:01 AM  Pre-Procedure Preparation:  Monitoring: As per clinic protocol. Respiration, ETCO2, SpO2, BP, heart rate and rhythm monitor placed and checked for adequate function Safety Precautions: Patient was assessed for positional comfort and pressure points before starting the procedure. Time-out: I initiated and conducted the "Time-out" before starting the procedure, as per protocol. The patient was asked to participate by confirming the accuracy of the "Time Out" information. Verification of the correct person, site, and procedure were performed and confirmed by me, the nursing staff, and the patient. "Time-out" conducted as per Joint Commission's Universal Protocol (UP.01.01.01). Time: [phone]  Description of Procedure:          Laterality: Bilateral. The procedure was performed in identical fashion on both sides. Levels:  L2, L3, L4, L5, & S1 Medial Branch Level(s) Area Prepped: Posterior Lumbosacral Region Prepping solution: ChloraPrep (2% chlorhexidine gluconate and 70% isopropyl alcohol) Safety Precautions: Aspiration looking for blood return was conducted prior  to all injections. At no point did we inject any substances, as a needle was being advanced. Before injecting, the patient was told to immediately notify me if she was experiencing any new onset of "ringing in the ears, or metallic taste in the mouth". No attempts were made at seeking any paresthesias. Safe injection practices and needle disposal techniques used. Medications properly checked for expiration dates. SDV (single dose vial) medications used. After the completion of the procedure, all disposable equipment used was discarded in the  proper designated medical waste containers. Local Anesthesia: Protocol guidelines were followed. The patient was positioned over the fluoroscopy table. The area was prepped in the usual manner. The time-out was completed. The target area was identified using fluoroscopy. A 12-in long, straight, sterile hemostat was used with fluoroscopic guidance to locate the targets for each level blocked. Once located, the skin was marked with an approved surgical skin marker. Once all sites were marked, the skin (epidermis, dermis, and hypodermis), as well as deeper tissues (fat, connective tissue and muscle) were infiltrated with a small amount of a short-acting local anesthetic, loaded on a 10cc syringe with a 25G, 1.5-in  Needle. An appropriate amount of time was allowed for local anesthetics to take effect before proceeding to the next step. Local Anesthetic: Lidocaine 2.0% The unused portion of the local anesthetic was discarded in the proper designated containers. Technical explanation of process:  L2 Medial Branch Nerve Block (MBB): The target area for the L2 medial branch is at the junction of the postero-lateral aspect of the superior articular process and the superior, posterior, and medial edge of the transverse process of L3. Under fluoroscopic guidance, a Quincke needle was inserted until contact was made with os over the superior postero-lateral aspect of the pedicular shadow (target area). After negative aspiration for blood, 0.5 mL of the nerve block solution was injected without difficulty or complication. The needle was removed intact. L3 Medial Branch Nerve Block (MBB): The target area for the L3 medial branch is at the junction of the postero-lateral aspect of the superior articular process and the superior, posterior, and medial edge of the transverse process of L4. Under fluoroscopic guidance, a Quincke needle was inserted until contact was made with os over the superior postero-lateral aspect of the  pedicular shadow (target area). After negative aspiration for blood, 0.5 mL of the nerve block solution was injected without difficulty or complication. The needle was removed intact. L4 Medial Branch Nerve Block (MBB): The target area for the L4 medial branch is at the junction of the postero-lateral aspect of the superior articular process and the superior, posterior, and medial edge of the transverse process of L5. Under fluoroscopic guidance, a Quincke needle was inserted until contact was made with os over the superior postero-lateral aspect of the pedicular shadow (target area). After negative aspiration for blood, 0.5 mL of the nerve block solution was injected without difficulty or complication. The needle was removed intact. L5 Medial Branch Nerve Block (MBB): The target area for the L5 medial branch is at the junction of the postero-lateral aspect of the superior articular process and the superior, posterior, and medial edge of the sacral ala. Under fluoroscopic guidance, a Quincke needle was inserted until contact was made with os over the superior postero-lateral aspect of the pedicular shadow (target area). After negative aspiration for blood, 0.5 mL of the nerve block solution was injected without difficulty or complication. The needle was removed intact. S1 Medial Branch Nerve Block (MBB): The  target area for the S1 medial branch is at the posterior and inferior 6 o'clock position of the L5-S1 facet joint. Under fluoroscopic guidance, the Quincke needle inserted for the L5 MBB was redirected until contact was made with os over the inferior and postero aspect of the sacrum, at the 6 o' clock position under the L5-S1 facet joint (Target area). After negative aspiration for blood, 0.5 mL of the nerve block solution was injected without difficulty or complication. The needle was removed intact.  Nerve block solution: 0.2% PF-Ropivacaine           The unused portion of the solution was discarded in  the proper designated containers. Procedural Needles: 22-gauge, 7-inch, Quincke needles used for all levels.  Once the entire procedure was completed, the treated area was cleaned, making sure to leave some of the prepping solution back to take advantage of its long term bactericidal properties.   Illustration of the posterior view of the lumbar spine and the posterior neural structures. Laminae of L2 through S1 are labeled. DPRL5, dorsal primary ramus of L5; DPRS1, dorsal primary ramus of S1; DPR3, dorsal primary ramus of L3; FJ, facet (zygapophyseal) joint L3-L4; I, inferior articular process of L4; LB1, lateral branch of dorsal primary ramus of L1; IAB, inferior articular branches from L3 medial branch (supplies L4-L5 facet joint); IBP, intermediate branch plexus; MB3, medial branch of dorsal primary ramus of L3; NR3, third lumbar nerve root; S, superior articular process of L5; SAB, superior articular branches from L4 (supplies L4-5 facet joint also); TP3, transverse process of L3.  Vitals:   01/05/19 0855 01/05/19 0905 01/05/19 0915 01/05/19 0926  BP: 120/86 (!) 98/54 (!) 102/55 103/69  Pulse: 83     Resp: 15 16 18 16   Temp:      SpO2: 100% 100% 98% 100%  Weight:      Height:         Start Time: 0846 hrs. End Time: 0855 hrs.  Imaging Guidance (Spinal):          Type of Imaging Technique: Fluoroscopy Guidance (Spinal) Indication(s): Assistance in needle guidance and placement for procedures requiring needle placement in or near specific anatomical locations not easily accessible without such assistance. Exposure Time: Please see nurses notes. Contrast: None used. Fluoroscopic Guidance: I was personally present during the use of fluoroscopy. "Tunnel Vision Technique" used to obtain the best possible view of the target area. Parallax error corrected before commencing the procedure. "Direction-depth-direction" technique used to introduce the needle under continuous pulsed fluoroscopy.  Once target was reached, antero-posterior, oblique, and lateral fluoroscopic projection used confirm needle placement in all planes. Images permanently stored in EMR. Interpretation: No contrast injected. I personally interpreted the imaging intraoperatively. Adequate needle placement confirmed in multiple planes. Permanent images saved into the patient's record.  Antibiotic Prophylaxis:   Anti-infectives (From admission, onward)   None     Indication(s): None identified  Post-operative Assessment:  Post-procedure Vital Signs:  Pulse/HCG Rate: 8380 Temp: 97.7 F (36.5 C) Resp: 16 BP: 103/69 SpO2: 100 %  EBL: None  Complications: No immediate post-treatment complications observed by team, or reported by patient.  Note: The patient tolerated the entire procedure well. A repeat set of vitals were taken after the procedure and the patient was kept under observation following institutional policy, for this type of procedure. Post-procedural neurological assessment was performed, showing return to baseline, prior to discharge. The patient was provided with post-procedure discharge instructions, including a section on how to identify potential  problems. Should any problems arise concerning this procedure, the patient was given instructions to immediately contact us, at any time, without hesitation. In any case, we plan to contact the patient by telephone for a follow-up status report regarding this interventional procedure.  Comments:  No additional relevant information.  Plan of Care    Imaging Orders     DG C-Arm 1-60 Min-No Report  Procedure Orders     LUMBAR FACET(MEDIAL BRANCH NERVE BLOCK) MBNB  Medications ordered for procedure: Meds ordered this encounter  Medications  . lidocaine (XYLOCAINE) 2 % (with pres) injection 400 mg  . midazolam (VERSED) 5 MG/5ML injection 1-2 mg    Make sure Flumazenil is available in the pyxis when using this medication. If oversedation occurs,  administer 0.2 mg IV over 15 sec. If after 45 sec no response, administer 0.2 mg again over 1 min; may repeat at 1 min intervals; not to exceed 4 doses (1 mg)  . fentaNYL (SUBLIMAZE) injection 25-50 mcg    Make sure Narcan is available in the pyxis when using this medication. In the event of respiratory depression (RR< 8/min): Titrate NARCAN (naloxone) in increments of 0.1 to 0.2 mg IV at 2-3 minute intervals, until desired degree of reversal.  . lactated ringers infusion 1,000 mL  . ropivacaine (PF) 2 mg/mL (0.2%) (NAROPIN) injection 18 mL  . Oxycodone HCl 10 MG TABS    Sig: Take 0.5-1 tablets (5-10 mg total) by mouth 2 (two) times daily as needed for up to 30 days. Must last 30 days. MAX.: 2/day    Dispense:  60 tablet    Refill:  0     STOP ACT - Not applicable. Fill one day early if pharmacy is closed on scheduled refill date. Do not fill until: 01/20/19. Must last 30 days. To last until: 02/19/19.   Medications administered: We administered lidocaine, midazolam, fentaNYL, lactated ringers, and ropivacaine (PF) 2 mg/mL (0.2%).  See the medical record for exact dosing, route, and time of administration.  Disposition: Discharge home  Discharge Date & Time: 01/05/2019; 0925 hrs.   Physician-requested Follow-up: Return for post-procedure eval (2 wks), w/ Dr. Dossie Arbour.  Future Appointments  Date Time Provider [place]  01/09/2019 10:00 AM Alisa Graff, FNP [facility] None  01/18/2019  8:30 AM Milinda Pointer, MD [facility] None  01/26/2019  4:10 PM [facility]   Primary Care Physician: Kathee Delton, MD Location: [facility] Note by: Gaspar Cola, MD Date: 01/05/2019; Time: 9:30 AM  Disclaimer:  Medicine is not an Chief Strategy Officer. The only guarantee in medicine is that nothing is guaranteed. It is important to note that the decision to proceed with this intervention was based on the information collected from the  patient. The Data and conclusions were drawn from the patient's questionnaire, the interview, and the physical examination. Because the information was provided in large part by the patient, it cannot be guaranteed that it has not been purposely or unconsciously manipulated. Every effort has been made to obtain as much relevant data as possible for this evaluation. It is important to note that the conclusions that lead to this procedure are derived in large part from the available data. Always take into account that the treatment will also be dependent on availability of resources and existing treatment guidelines, considered by other Pain Management Practitioners as being common knowledge and practice, at the time of the intervention. For Medico-Legal purposes, it is also important to  point out that variation in procedural techniques and pharmacological choices are the acceptable norm. The indications, contraindications, technique, and results of the above procedure should only be interpreted and judged by a Board-Certified Interventional Pain Specialist with extensive familiarity and expertise in the same exact procedure and technique.

## 2019-01-05 NOTE — Progress Notes (Signed)
Safety precautions to be maintained throughout the outpatient stay will include: orient to surroundings, keep bed in low position, maintain call bell within reach at all times, provide assistance with transfer out of bed and ambulation.  

## 2019-01-06 ENCOUNTER — Telehealth: Payer: Self-pay

## 2019-01-06 NOTE — Telephone Encounter (Signed)
Post procedure phone call. Patient states she is doing good.  

## 2019-01-09 ENCOUNTER — Ambulatory Visit: Payer: Medicaid Other | Admitting: Family

## 2019-01-11 ENCOUNTER — Other Ambulatory Visit: Payer: Self-pay | Admitting: Neurology

## 2019-01-11 DIAGNOSIS — M545 Low back pain, unspecified: Secondary | ICD-10-CM

## 2019-01-16 ENCOUNTER — Encounter: Payer: Medicaid Other | Attending: Cardiovascular Disease | Admitting: *Deleted

## 2019-01-16 ENCOUNTER — Encounter: Payer: Self-pay | Admitting: *Deleted

## 2019-01-16 VITALS — Ht 66.4 in | Wt 273.3 lb

## 2019-01-16 DIAGNOSIS — R269 Unspecified abnormalities of gait and mobility: Secondary | ICD-10-CM | POA: Insufficient documentation

## 2019-01-16 DIAGNOSIS — I051 Rheumatic mitral insufficiency: Secondary | ICD-10-CM | POA: Insufficient documentation

## 2019-01-16 DIAGNOSIS — I050 Rheumatic mitral stenosis: Secondary | ICD-10-CM | POA: Insufficient documentation

## 2019-01-16 DIAGNOSIS — I5032 Chronic diastolic (congestive) heart failure: Secondary | ICD-10-CM

## 2019-01-16 NOTE — Progress Notes (Signed)
Pulmonary Individual Treatment Plan  Patient Details  Name: Chelsea Ramsey MRN: [id number] Date of Birth: 1966-05-11 Referring Provider:     Pulmonary Rehab from 01/16/2019 in [facility] Cardiac and Pulmonary Rehab  Referring Provider  Isaias Cowman MD      Initial Encounter Date:    Pulmonary Rehab from 01/16/2019 in [facility] Cardiac and Pulmonary Rehab  Date  01/16/19      Visit Diagnosis: Heart failure, diastolic, chronic ([place])  Patient's Home Medications on Admission:  Current Outpatient Medications:  .  bisacodyl (DULCOLAX) 5 MG EC tablet, Take 2 tablets (10 mg total) by mouth daily as needed for moderate constipation., Disp: 30 tablet, Rfl: 0 .  budesonide-formoterol (SYMBICORT) 160-4.5 MCG/ACT inhaler, Inhale 2 puffs into the lungs 2 (two) times daily., Disp: , Rfl:  .  butalbital-acetaminophen-caffeine (FIORICET, ESGIC) 50-325-40 MG tablet, Take 1 tablet by mouth as needed., Disp: , Rfl:  .  Calcium Carbonate-Vit D-Min (GNP CALCIUM 1200) 1200-1000 MG-UNIT CHEW, Chew 1,200 mg by mouth daily with breakfast. Take in combination with vitamin D and magnesium., Disp: 30 tablet, Rfl: 5 .  Cholecalciferol (VITAMIN D3) 125 MCG (5000 UT) CAPS, Take 1 capsule (5,000 Units total) by mouth daily with breakfast. Take along with calcium and magnesium., Disp: 30 capsule, Rfl: 5 .  furosemide (LASIX) 40 MG tablet, Take 1 tablet (40 mg total) by mouth 2 (two) times daily., Disp: 60 tablet, Rfl: 11 .  gabapentin (NEURONTIN) 600 MG tablet, Take 0.5 tablets (300 mg total) by mouth 2 (two) times daily. (Patient taking differently: Take 600 mg by mouth 3 (three) times daily. ), Disp: 60 tablet, Rfl: 0 .  guaiFENesin (MUCINEX) 600 MG 12 hr tablet, Take 1 tablet (600 mg total) by mouth 2 (two) times daily., Disp: 20 tablet, Rfl: 0 .  lactulose (CHRONULAC) 10 GM/15ML solution, Take 45 mLs (30 g total) by mouth 2 (two) times daily as needed for mild constipation., Disp: 240 mL, Rfl: 0 .  magnesium oxide  (MAG-OX) 400 MG tablet, Take 400 mg by mouth 2 (two) times daily., Disp: , Rfl:  .  meclizine (ANTIVERT) 12.5 MG tablet, Take 25 mg by mouth 3 (three) times daily as needed for dizziness., Disp: , Rfl:  .  metolazone (ZAROXOLYN) 2.5 MG tablet, Take 1 tablet (2.5 mg total) by mouth daily., Disp: 10 tablet, Rfl: 0 .  metoprolol tartrate (LOPRESSOR) 100 MG tablet, Take 1 tablet (100 mg total) by mouth 2 (two) times daily., Disp: 60 tablet, Rfl: 3 .  nicotine (NICODERM CQ - DOSED IN MG/24 HR) 7 mg/24hr patch, Place 1 patch (7 mg total) onto the skin daily., Disp: 28 patch, Rfl: 0 .  nitroGLYCERIN (NITROSTAT) 0.4 MG SL tablet, Place 1 tablet (0.4 mg total) under the tongue every 5 (five) minutes as needed for chest pain., Disp: 30 tablet, Rfl: 12 .  nortriptyline (PAMELOR) 10 MG capsule, Take 10 mg by mouth Nightly., Disp: , Rfl:  .  [START ON 01/20/2019] Oxycodone HCl 10 MG TABS, Take 0.5-1 tablets (5-10 mg total) by mouth 2 (two) times daily as needed for up to 30 days. Must last 30 days. MAX.: 2/day, Disp: 60 tablet, Rfl: 0 .  potassium chloride SA (K-DUR,KLOR-CON) 20 MEQ tablet, Take 1 tablet (20 mEq total) by mouth daily. And additional tablet when taking metolazone, Disp: 45 tablet, Rfl: 5 .  promethazine (PHENERGAN) 12.5 MG tablet, Take 1 tablet (12.5 mg total) by mouth every 6 (six) hours as needed for nausea or vomiting., Disp: 30  tablet, Rfl: 0 .  rivaroxaban (XARELTO) 20 MG TABS tablet, Take 20 mg by mouth every evening. , Disp: , Rfl:  .  tiZANidine (ZANAFLEX) 4 MG tablet, Take 4 mg by mouth at bedtime. , Disp: , Rfl:  .  ALPRAZolam (XANAX) 0.25 MG tablet, Take 1 tablet (0.25 mg total) by mouth daily as needed for anxiety. (Patient not taking: Reported on 01/16/2019), Disp: 7 tablet, Rfl: 0 .  colchicine 0.6 MG tablet, Take 1 tablet (0.6 mg total) by mouth 2 (two) times daily., Disp: 14 tablet, Rfl: 0 .  ipratropium-albuterol (DUONEB) 0.5-2.5 (3) MG/3ML SOLN, Inhale 3 mLs into the lungs 3 (three)  times daily as needed (respiratory). , Disp: , Rfl:  .  omeprazole (PRILOSEC) 40 MG capsule, Take 40 mg by mouth 2 (two) times daily., Disp: , Rfl:  .  predniSONE (DELTASONE) 10 MG tablet, Take 6 tablets day 1, take 5 tablets day 2, take 4 tablets day 3, take 3 tablets day 4, take 2 tablets day 5, take 1 tablet on day 6 (Patient not taking: Reported on 01/16/2019), Disp: 21 tablet, Rfl: 0 .  topiramate (TOPAMAX) 25 MG tablet, Take 25 mg by mouth 2 (two) times daily. , Disp: , Rfl:   Past Medical History: Past Medical History:  Diagnosis Date  . Allergy    seasonal  . Anxiety   . Arthritis    Right Knee  . Asthma   . CHF (congestive heart failure) ([place])   . COPD (chronic obstructive pulmonary disease) ([place])   . Coronary artery disease    Leaky heart valve  . Fibromyalgia   . GERD (gastroesophageal reflux disease)   . Hypertension   . Pneumonia   . PUD (peptic ulcer disease)   . Pulmonary HTN ([place])   . Rheumatic fever/heart disease   . Sleep apnea     Tobacco Use: Social History   Tobacco Use  Smoking Status Current Every Day Smoker  . Packs/day: 0.25  . Types: Cigarettes  Smokeless Tobacco Never Used  Tobacco Comment   01/16/19 still smoking 1-2 per day.  given fake cigarette to help    Labs: Recent Review Flowsheet Data    Labs for ITP Cardiac and Pulmonary Rehab Latest Ref Rng & Units 06/03/2012 12/09/2013 04/03/2014 08/21/2014 12/20/2017   Cholestrol 0 - 200 mg/dL - 91 96 - -   LDLCALC mg/dL - 50 51 - -   HDL 35 - 70 mg/dL - 31(L) 32(A) - -   Trlycerides 40 - 160 mg/dL - 50 63 - -   Hemoglobin A1c - 4.5 - - 5.2 -   PHART 7.350 - 7.450 - - - - 7.39   PCO2ART 32.0 - 48.0 mmHg - - - - 41   HCO3 20.0 - 28.0 mmol/L - - - - 24.8   ACIDBASEDEF 0.0 - 2.0 mmol/L - - - - 0.2   O2SAT % - - - - 98.4       Pulmonary Assessment Scores: Pulmonary Assessment Scores    Row Name 01/16/19 1238         ADL UCSD   ADL Phase  Entry     SOB Score total  77     Rest  2     Walk  2      Stairs  4     Bath  3     Dress  4     Shop  3       CAT Score  CAT Score  24       mMRC Score   mMRC Score  2        Pulmonary Function Assessment: Pulmonary Function Assessment - 01/16/19 1239      Breath   Bilateral Breath Sounds  Clear    Shortness of Breath  Limiting activity;Yes       Exercise Target Goals: Exercise Program Goal: Individual exercise prescription set using results from initial 6 min walk test and THRR while considering  patient's activity barriers and safety.   Exercise Prescription Goal: Initial exercise prescription builds to 30-45 minutes a day of aerobic activity, 2-3 days per week.  Home exercise guidelines will be given to patient during program as part of exercise prescription that the participant will acknowledge.  Activity Barriers & Risk Stratification: Activity Barriers & Cardiac Risk Stratification - 01/16/19 1244      Activity Barriers & Cardiac Risk Stratification   Activity Barriers  Arthritis;Back Problems;Shortness of Breath;Joint Problems;Fibromyalgia;Deconditioning;Muscular Weakness;Decreased Ventricular Function       6 Minute Walk: 6 Minute Walk    Row Name 08/25/18 1500 01/16/19 1438       6 Minute Walk   Phase  Initial  Initial    Distance  965 feet  1067 feet    Walk Time  6 minutes  6 minutes    # of Rest Breaks  0  0    MPH  1.83  2.02    METS  2.7  2.7    RPE  11  15    Perceived Dyspnea   2  2    VO2 Peak  9.46  9.46    Symptoms  Yes (comment)  Yes (comment)    Comments  back pain 6/10 / pulled 02 tank on 2 L  back and leg pain 8/10, SOB    Resting HR  102 bpm  90 bpm    Resting BP  126/90  124/72    Resting Oxygen Saturation   98 %  95 %    Exercise Oxygen Saturation  during 6 min walk  93 %  87 %    Max Ex. HR  110 bpm  108 bpm    Max Ex. BP  136/84  134/74    2 Minute Post BP  116/84  124/70      Interval HR   1 Minute HR  -  92    2 Minute HR  -  95    3 Minute HR  -  93    4 Minute HR  -  99     5 Minute HR  -  102    6 Minute HR  -  108    2 Minute Post HR  -  98    Interval Heart Rate?  -  Yes      Interval Oxygen   Interval Oxygen?  Yes  Yes    Baseline Oxygen Saturation %  -  95 %    1 Minute Oxygen Saturation %  93 %  92 %    1 Minute Liters of Oxygen  2 L  0 L Room Air    2 Minute Oxygen Saturation %  95 %  93 %    2 Minute Liters of Oxygen  2 L  0 L    3 Minute Oxygen Saturation %  -  87 %    3 Minute Liters of Oxygen  2 L  0 L  4 Minute Oxygen Saturation %  95 %  94 %    4 Minute Liters of Oxygen  2 L  0 L    5 Minute Oxygen Saturation %  -  90 %    5 Minute Liters of Oxygen  2 L  0 L    6 Minute Oxygen Saturation %  97 %  87 %    6 Minute Liters of Oxygen  2 L  0 L    2 Minute Post Oxygen Saturation %  97 %  98 %    2 Minute Post Liters of Oxygen  2 L  0 L      Oxygen Initial Assessment: Oxygen Initial Assessment - 01/16/19 1239      Home Oxygen   Home Oxygen Device  Portable Concentrator;Home Concentrator    Sleep Oxygen Prescription  Continuous    Liters per minute  3    Home Exercise Oxygen Prescription  None    Home at Rest Exercise Oxygen Prescription  None    Compliance with Home Oxygen Use  Yes      Initial 6 min Walk   Oxygen Used  None      Program Oxygen Prescription   Program Oxygen Prescription  None      Intervention   Short Term Goals  To learn and exhibit compliance with exercise, home and travel O2 prescription;To learn and understand importance of monitoring SPO2 with pulse oximeter and demonstrate accurate use of the pulse oximeter.;To learn and understand importance of maintaining oxygen saturations>88%;To learn and demonstrate proper pursed lip breathing techniques or other breathing techniques.;To learn and demonstrate proper use of respiratory medications    Long  Term Goals  Exhibits compliance with exercise, home and travel O2 prescription;Verbalizes importance of monitoring SPO2 with pulse oximeter and return  demonstration;Maintenance of O2 saturations>88%;Exhibits proper breathing techniques, such as pursed lip breathing or other method taught during program session;Compliance with respiratory medication;Demonstrates proper use of MDI's       Oxygen Re-Evaluation: Oxygen Re-Evaluation    Row Name 09/27/18 1012             Program Oxygen Prescription   Program Oxygen Prescription  None         Home Oxygen   Home Oxygen Device  Portable Concentrator;Home Concentrator       Sleep Oxygen Prescription  Continuous       Liters per minute  3       Home Exercise Oxygen Prescription  None       Home at Rest Exercise Oxygen Prescription  None uses as needed on 2L       Compliance with Home Oxygen Use  Yes         Goals/Expected Outcomes   Short Term Goals  To learn and exhibit compliance with exercise, home and travel O2 prescription;To learn and understand importance of monitoring SPO2 with pulse oximeter and demonstrate accurate use of the pulse oximeter.;To learn and understand importance of maintaining oxygen saturations>88%;To learn and demonstrate proper pursed lip breathing techniques or other breathing techniques.;To learn and demonstrate proper use of respiratory medications       Long  Term Goals  Exhibits compliance with exercise, home and travel O2 prescription;Verbalizes importance of monitoring SPO2 with pulse oximeter and return demonstration;Maintenance of O2 saturations>88%;Exhibits proper breathing techniques, such as pursed lip breathing or other method taught during program session;Compliance with respiratory medication       Comments  Chelsea Ramsey is doing well  in rehab.  She is doing well with her nebulaizers and inhalers.  She monitors her sats daily with pulse oximeter.  We talked about using PLB regularly. She is compliant with her oxygen at night and only uses it during the day if she feels SOB.       Goals/Expected Outcomes  Short: Continue to monitor saturations and wean off day  time use of oxygen.  Long: Continue monitor pulmonary disease.           Oxygen Discharge (Final Oxygen Re-Evaluation): Oxygen Re-Evaluation - 09/27/18 1012      Program Oxygen Prescription   Program Oxygen Prescription  None      Home Oxygen   Home Oxygen Device  Portable Concentrator;Home Concentrator    Sleep Oxygen Prescription  Continuous    Liters per minute  3    Home Exercise Oxygen Prescription  None    Home at Rest Exercise Oxygen Prescription  None   uses as needed on 2L   Compliance with Home Oxygen Use  Yes      Goals/Expected Outcomes   Short Term Goals  To learn and exhibit compliance with exercise, home and travel O2 prescription;To learn and understand importance of monitoring SPO2 with pulse oximeter and demonstrate accurate use of the pulse oximeter.;To learn and understand importance of maintaining oxygen saturations>88%;To learn and demonstrate proper pursed lip breathing techniques or other breathing techniques.;To learn and demonstrate proper use of respiratory medications    Long  Term Goals  Exhibits compliance with exercise, home and travel O2 prescription;Verbalizes importance of monitoring SPO2 with pulse oximeter and return demonstration;Maintenance of O2 saturations>88%;Exhibits proper breathing techniques, such as pursed lip breathing or other method taught during program session;Compliance with respiratory medication    Comments  Chelsea Ramsey is doing well in rehab.  She is doing well with her nebulaizers and inhalers.  She monitors her sats daily with pulse oximeter.  We talked about using PLB regularly. She is compliant with her oxygen at night and only uses it during the day if she feels SOB.    Goals/Expected Outcomes  Short: Continue to monitor saturations and wean off day time use of oxygen.  Long: Continue monitor pulmonary disease.        Initial Exercise Prescription: Initial Exercise Prescription - 01/16/19 1400      Date of Initial Exercise RX and  Referring Provider   Date  01/16/19    Referring Provider  Paraschos, Alexander MD      Treadmill   MPH  1.5    Grade  0.5    Minutes  15    METs  2.25      NuStep   Level  2    SPM  80    Minutes  15    METs  2.2      REL-XR   Level  1    Speed  50    Minutes  15    METs  2.2      Prescription Details   Frequency (times per week)  3    Duration  Progress to 45 minutes of aerobic exercise without signs/symptoms of physical distress      Intensity   THRR 40-80% of Max Heartrate  122*153    Ratings of Perceived Exertion  11-13    Perceived Dyspnea  0-4      Progression   Progression  Continue to progress workloads to maintain intensity without signs/symptoms of physical distress.  Resistance Training   Training Prescription  Yes    Weight  3 lbs    Reps  10-15       Perform Capillary Blood Glucose checks as needed.  Exercise Prescription Changes: Exercise Prescription Changes    Row Name 08/25/18 1500 09/12/18 1600 09/27/18 1000 10/11/18 1500 01/16/19 1400     Response to Exercise   Blood Pressure (Admit)  126/90  132/82  122/62  126/70  124/72   Blood Pressure (Exercise)  136/84  138/60  146/74  148/80  134/74   Blood Pressure (Exit)  116/84  122/78  122/62  -  124/70   Heart Rate (Admit)  102 bpm  95 bpm  71 bpm  86 bpm  90 bpm   Heart Rate (Exercise)  110 bpm  112 bpm  121 bpm  119 bpm  108 bpm   Heart Rate (Exit)  102 bpm  92 bpm  103 bpm  90 bpm  98 bpm   Oxygen Saturation (Admit)  96 %  -  -  -  95 %   Oxygen Saturation (Exercise)  93 %  95 %  -  -  87 %   Oxygen Saturation (Exit)  97 %  -  -  -  98 %   Rating of Perceived Exertion (Exercise)  _0 Perceived Dyspnea (Exercise)  2  -  -  -  2   Symptoms  back pain  none  none  CP, dizziness  SOB, back/leg pain 8/10   Comments  gout L toe  first full day of exercise  -  -  walk test results   Duration  Progress to 30 minutes of  aerobic without signs/symptoms of physical distress   Progress to 30 minutes of  aerobic without signs/symptoms of physical distress  Continue with 30 min of aerobic exercise without signs/symptoms of physical distress.  Continue with 30 min of aerobic exercise without signs/symptoms of physical distress.  -   Intensity  -  THRR New  THRR unchanged  THRR unchanged  -     Progression   Progression  -  Continue to progress workloads to maintain intensity without signs/symptoms of physical distress.  Continue to progress workloads to maintain intensity without signs/symptoms of physical distress.  Continue to progress workloads to maintain intensity without signs/symptoms of physical distress.  -   Average METs  -  2.45  2.66  -  -     Resistance Training   Training Prescription  -  Yes  Yes  Yes  -   Weight  -  3 lb  3 lbs  3 lbs  -   Reps  -  10-15  10-15  10-15  -     Interval Training   Interval Training  -  No  No  No  -     Treadmill   MPH  -  -  2.3  2.3  -   Grade  -  -  1  1  -   Minutes  -  -  15  12  -   METs  -  -  3.08  -  -     NuStep   Level  -  2  2  -  -   Minutes  -  15  15  -  -   METs  -  2.9  2.9  -  -  Biostep-RELP   Level  -  2  8  -  -   Minutes  -  15  15  -  -   METs  -  2  2  -  -     Home Exercise Plan   Plans to continue exercise at  -  -  Home (comment) walk at park  Home (comment) walk at park  -   Frequency  -  -  Add 3 additional days to program exercise sessions.  Add 3 additional days to program exercise sessions.  -   Initial Home Exercises Provided  -  -  09/27/18  09/27/18  -      Exercise Comments: Exercise Comments    Row Name 09/08/18 0910           Exercise Comments  First full day of exercise!  Patient was oriented to gym and equipment including functions, settings, policies, and procedures.  Patient's individual exercise prescription and treatment plan were reviewed.  All starting workloads were established based on the results of the 6 minute walk test done at initial orientation  visit.  The plan for exercise progression was also introduced and progression will be customized based on patient's performance and goals.          Exercise Goals and Review: Exercise Goals    Row Name 08/25/18 1500 01/16/19 1443           Exercise Goals   Increase Physical Activity  Yes  Yes      Intervention  Provide advice, education, support and counseling about physical activity/exercise needs.;Develop an individualized exercise prescription for aerobic and resistive training based on initial evaluation findings, risk stratification, comorbidities and participant's personal goals.  Provide advice, education, support and counseling about physical activity/exercise needs.;Develop an individualized exercise prescription for aerobic and resistive training based on initial evaluation findings, risk stratification, comorbidities and participant's personal goals.      Expected Outcomes  Short Term: Attend rehab on a regular basis to increase amount of physical activity.;Long Term: Add in home exercise to make exercise part of routine and to increase amount of physical activity.;Long Term: Exercising regularly at least 3-5 days a week.  Short Term: Attend rehab on a regular basis to increase amount of physical activity.;Long Term: Add in home exercise to make exercise part of routine and to increase amount of physical activity.;Long Term: Exercising regularly at least 3-5 days a week.      Increase Strength and Stamina  Yes  Yes      Intervention  Provide advice, education, support and counseling about physical activity/exercise needs.;Develop an individualized exercise prescription for aerobic and resistive training based on initial evaluation findings, risk stratification, comorbidities and participant's personal goals.  Provide advice, education, support and counseling about physical activity/exercise needs.;Develop an individualized exercise prescription for aerobic and resistive training based on  initial evaluation findings, risk stratification, comorbidities and participant's personal goals.      Expected Outcomes  Short Term: Increase workloads from initial exercise prescription for resistance, speed, and METs.;Short Term: Perform resistance training exercises routinely during rehab and add in resistance training at home;Long Term: Improve cardiorespiratory fitness, muscular endurance and strength as measured by increased METs and functional capacity (6MWT)  Short Term: Increase workloads from initial exercise prescription for resistance, speed, and METs.;Short Term: Perform resistance training exercises routinely during rehab and add in resistance training at home;Long Term: Improve cardiorespiratory fitness, muscular endurance and strength as measured by increased METs  and functional capacity (6MWT)      Able to understand and use rate of perceived exertion (RPE) scale  Yes  Yes      Intervention  Provide education and explanation on how to use RPE scale  Provide education and explanation on how to use RPE scale      Expected Outcomes  Short Term: Able to use RPE daily in rehab to express subjective intensity level;Long Term:  Able to use RPE to guide intensity level when exercising independently  Short Term: Able to use RPE daily in rehab to express subjective intensity level;Long Term:  Able to use RPE to guide intensity level when exercising independently      Able to understand and use Dyspnea scale  Yes  Yes      Intervention  Provide education and explanation on how to use Dyspnea scale  Provide education and explanation on how to use Dyspnea scale      Expected Outcomes  Short Term: Able to use Dyspnea scale daily in rehab to express subjective sense of shortness of breath during exertion;Long Term: Able to use Dyspnea scale to guide intensity level when exercising independently  Short Term: Able to use Dyspnea scale daily in rehab to express subjective sense of shortness of breath during  exertion;Long Term: Able to use Dyspnea scale to guide intensity level when exercising independently      Knowledge and understanding of Target Heart Rate Range (THRR)  Yes  Yes      Intervention  Provide education and explanation of THRR including how the numbers were predicted and where they are located for reference  Provide education and explanation of THRR including how the numbers were predicted and where they are located for reference      Expected Outcomes  Short Term: Able to state/look up THRR;Short Term: Able to use daily as guideline for intensity in rehab;Long Term: Able to use THRR to govern intensity when exercising independently  Short Term: Able to state/look up THRR;Short Term: Able to use daily as guideline for intensity in rehab;Long Term: Able to use THRR to govern intensity when exercising independently      Able to check pulse independently  Yes  Yes      Intervention  Provide education and demonstration on how to check pulse in carotid and radial arteries.;Review the importance of being able to check your own pulse for safety during independent exercise  Provide education and demonstration on how to check pulse in carotid and radial arteries.;Review the importance of being able to check your own pulse for safety during independent exercise      Expected Outcomes  Short Term: Able to explain why pulse checking is important during independent exercise;Long Term: Able to check pulse independently and accurately  Short Term: Able to explain why pulse checking is important during independent exercise;Long Term: Able to check pulse independently and accurately      Understanding of Exercise Prescription  Yes  Yes      Intervention  Provide education, explanation, and written materials on patient's individual exercise prescription  Provide education, explanation, and written materials on patient's individual exercise prescription      Expected Outcomes  Short Term: Able to explain program  exercise prescription;Long Term: Able to explain home exercise prescription to exercise independently  Short Term: Able to explain program exercise prescription;Long Term: Able to explain home exercise prescription to exercise independently         Exercise Goals Re-Evaluation : Exercise Goals  Re-Evaluation    Row Name 09/08/18 0911 09/22/18 1017 09/27/18 1001 10/11/18 1549 11/25/18 0942     Exercise Goal Re-Evaluation   Exercise Goals Review  Increase Physical Activity;Increase Strength and Stamina;Able to understand and use rate of perceived exertion (RPE) scale;Knowledge and understanding of Target Heart Rate Range (THRR);Understanding of Exercise Prescription  -  Increase Physical Activity;Increase Strength and Stamina;Understanding of Exercise Prescription  Increase Physical Activity;Increase Strength and Stamina;Understanding of Exercise Prescription  -   Comments  Reviewed RPE scale, THR and program prescription with pt today.  Pt voiced understanding and was given a copy of goals to take home.   -  Chelsea Ramsey is off to a good start in rehab.  She has already started to notice that her strength and stamina are starting to come back. She is already walking daily in the morning.  She continues to have some back pain, but knows strengthening the muscles will help. She also has some sciattica pain in back and legs.  Reviewed home exercise with pt today.  Pt plans to walk at park for exercise.  Reviewed THR, pulse, RPE, sign and symptoms, and when to call 911 or MD.  Also discussed weather considerations and indoor options.  Pt voiced understanding.  Chelsea Ramsey has been doing well in rehab. She has only attended once since last review.  Today, she had a spell on the treadmill with dizziness and chest pain.  She was sent to the ED for further work up.  Troponins were negative.   Once she is able to return we will continue to work on her progress.   out since last review   Expected Outcomes  Short: Use RPE daily to  regulate intensity. Long: Follow program prescription in THR.  -  Short: Continue to walk daily.  Long: Continue to attend regularly and exercise more.   Short: Cleared to return to rehab.  Long: Continue to improve strength and stamina.   -   Row Name 12/06/18 1421             Exercise Goal Re-Evaluation   Comments  out since last review          Discharge Exercise Prescription (Final Exercise Prescription Changes): Exercise Prescription Changes - 01/16/19 1400      Response to Exercise   Blood Pressure (Admit)  124/72    Blood Pressure (Exercise)  134/74    Blood Pressure (Exit)  124/70    Heart Rate (Admit)  90 bpm    Heart Rate (Exercise)  108 bpm    Heart Rate (Exit)  98 bpm    Oxygen Saturation (Admit)  95 %    Oxygen Saturation (Exercise)  87 %    Oxygen Saturation (Exit)  98 %    Rating of Perceived Exertion (Exercise)  15    Perceived Dyspnea (Exercise)  2    Symptoms  SOB, back/leg pain 8/10    Comments  walk test results       Nutrition:  Target Goals: Understanding of nutrition guidelines, daily intake of sodium <1531m, cholesterol <2057m calories 30% from fat and 7% or less from saturated fats, daily to have 5 or more servings of fruits and vegetables.  Biometrics: Pre Biometrics - 01/16/19 1444      Pre Biometrics   Height  5' 6.4" (1.687 m)    Weight  273 lb 4.8 oz (124 kg)    Waist Circumference  45 inches    Hip Circumference  52 inches  Waist to Hip Ratio  0.87 %    BMI (Calculated)  43.56    Single Leg Stand  2.79 seconds        Nutrition Therapy Plan and Nutrition Goals: Nutrition Therapy & Goals - 01/16/19 1242      Intervention Plan   Intervention  Prescribe, educate and counsel regarding individualized specific dietary modifications aiming towards targeted core components such as weight, hypertension, lipid management, diabetes, heart failure and other comorbidities.;Nutrition handout(s) given to patient.    Expected Outcomes  Short  Term Goal: Understand basic principles of dietary content, such as calories, fat, sodium, cholesterol and nutrients.;Short Term Goal: A plan has been developed with personal nutrition goals set during dietitian appointment.;Long Term Goal: Adherence to prescribed nutrition plan.       Nutrition Assessments: Nutrition Assessments - 01/16/19 1245      MEDFICTS Scores   Pre Score  30       Nutrition Goals Re-Evaluation: Nutrition Goals Re-Evaluation    [place] Name 09/22/18 1147             Goals   Nutrition Goal  Work to decrease soda and candy consumption. The best solution would be not to buy these items in the first place to reduce temptation, but you can also start by setting goals such as drinking one less glass of soda and one more glass of water per day, or choosing applesauce/ pudding rather than a candy bar as a snack       Comment  Currently she drinks 3L of Coke-a-Cola in two days, drinks juices regularly and chooses candy bars and chips as snacks regularly       Expected Outcome  She will slowly decrease SSB and non-nutritious snack intake until they are either eliminated from her diet or consumed only in moderation. She will replace SSB with water and candy/chips with more nutrient-dense snack options         Personal Goal #2 Re-Evaluation   Personal Goal #2  Increase fruit and vegetable intake by at least one additional serving per week. Long term goal would be to eat multiple servings of fruits and vegetables daily         Personal Goal #3 Re-Evaluation   Personal Goal #3  Rather than 'nibbling' on foods througout the day, work to create a structured eating plan that includes meals/snacks with at least two food groups. This will help improve fullness and satiety when you eat and decrease cravings          Nutrition Goals Discharge (Final Nutrition Goals Re-Evaluation): Nutrition Goals Re-Evaluation - 09/22/18 1147      Goals   Nutrition Goal  Work to decrease soda and  candy consumption. The best solution would be not to buy these items in the first place to reduce temptation, but you can also start by setting goals such as drinking one less glass of soda and one more glass of water per day, or choosing applesauce/ pudding rather than a candy bar as a snack    Comment  Currently she drinks 3L of Coke-a-Cola in two days, drinks juices regularly and chooses candy bars and chips as snacks regularly    Expected Outcome  She will slowly decrease SSB and non-nutritious snack intake until they are either eliminated from her diet or consumed only in moderation. She will replace SSB with water and candy/chips with more nutrient-dense snack options      Personal Goal #2 Re-Evaluation   Personal Goal #  2  Increase fruit and vegetable intake by at least one additional serving per week. Long term goal would be to eat multiple servings of fruits and vegetables daily      Personal Goal #3 Re-Evaluation   Personal Goal #3  Rather than 'nibbling' on foods througout the day, work to create a structured eating plan that includes meals/snacks with at least two food groups. This will help improve fullness and satiety when you eat and decrease cravings       Psychosocial: Target Goals: Acknowledge presence or absence of significant depression and/or stress, maximize coping skills, provide positive support system. Participant is able to verbalize types and ability to use techniques and skills needed for reducing stress and depression.   Initial Review & Psychosocial Screening: Initial Psych Review & Screening - 01/16/19 1240      Initial Review   Current issues with  Current Sleep Concerns;Current Stress Concerns;Current Depression;Current Psychotropic Meds    Source of Stress Concerns  Transportation;Unable to perform yard/household activities;Financial;Chronic Illness    Comments  Still struggling ot sleep with breathing.  She is taking [employer] bus for transportation. Took course of  [employer].  Family is still helping her get around.       Family Dynamics   Good Support System?  Yes    Comments  Support system is mother, aunt, and female friend      Barriers   Psychosocial barriers to participate in program  The patient should benefit from training in stress management and relaxation.;Psychosocial barriers identified (see note)      Screening Interventions   Interventions  Encouraged to exercise;Program counselor consult;To provide support and resources with identified psychosocial needs;Provide feedback about the scores to participant    Expected Outcomes  Short Term goal: Utilizing psychosocial counselor, staff and physician to assist with identification of specific Stressors or current issues interfering with healing process. Setting desired goal for each stressor or current issue identified.;Long Term Goal: Stressors or current issues are controlled or eliminated.;Short Term goal: Identification and review with participant of any Quality of Life or Depression concerns found by scoring the questionnaire.;Long Term goal: The participant improves quality of Life and PHQ9 Scores as seen by post scores and/or verbalization of changes       Quality of Life Scores: Quality of Life - 08/25/18 1545      Quality of Life   Select  Quality of Life      Quality of Life Scores   Health/Function Pre  16.4 %    Socioeconomic Pre  22.29 %    Psych/Spiritual Pre  18 %    Family Pre  27 %    GLOBAL Pre  19.27 %      Scores of 19 and below usually indicate a poorer quality of life in these areas.  A difference of  2-3 points is a clinically meaningful difference.  A difference of 2-3 points in the total score of the Quality of Life Index has been associated with significant improvement in overall quality of life, self-image, physical symptoms, and general health in studies assessing change in quality of life.  PHQ-9: Recent Review Flowsheet Data    Depression screen [facility] 2/9  01/16/2019 01/05/2019 11/14/2018 08/25/2018 07/26/2018   Decreased Interest 1 0 0 1 0   Down, Depressed, Hopeless 0 0 0 0 0   PHQ - 2 Score 1 0 0 1 0   Altered sleeping 2 - - 2 -   Tired, decreased energy 1 - -  2 -   Change in appetite 2 - - 1 -   Feeling bad or failure about yourself  0 - - 0 -   Trouble concentrating 0 - - 2 -   Moving slowly or fidgety/restless 0 - - 0 -   Suicidal thoughts 0 - - 0 -   PHQ-9 Score 6 - - 8 -   Difficult doing work/chores Not difficult at all - - Not difficult at all -     Interpretation of Total Score  Total Score Depression Severity:  1-4 = Minimal depression, 5-9 = Mild depression, 10-14 = Moderate depression, 15-19 = Moderately severe depression, 20-27 = Severe depression   Psychosocial Evaluation and Intervention: Psychosocial Evaluation - 09/22/18 1039      Psychosocial Evaluation & Interventions   Interventions  Stress management education;Relaxation education;Encouraged to exercise with the program and follow exercise prescription;Therapist referral    Comments  Counselor met with Chelsea Ramsey today [facility]) for initial psychosocial evaluation.  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  Chelsea Ramsey has multiple issues with Fibromyalgia; COPD; CHF: Pulmonary Lung Disease; Osteoarthritis; and OSA in addition to her Cardiac condition.  She has a strong support system with a spouse of 61 years; parents who live locally and active involvement in her local church.  [place] with interrupted sleep and had a sleep study before her surgery and is awaiting a CPAP as a result.  Her appetite is up and down currently.  Chelsea Ramsey reports a long history of depression and anxiety with some panic attacks.  She reports her Dr. just prescribed new meds for this but they are not working and she "feels worse" with more "irritability."  Counselor encouraged her to let her Dr. know this asap.  She has a trauma history that has never been addressed fully with the  loss of an infant at 13 days old (her only child).  Chelsea Ramsey has multiple stressors with her health as well as unresolved grief and trauma.  counselor provided contact info for a therapist locally, and Chelsea Ramsey agreed to pursue this option.  She has goals to lose weight; decrease stress; improve her sleep quality; and increase her stamina and strength while in this program.      Expected Outcomes  Short:  Chelsea Ramsey will contact a therapist locally to support her journey of grief and unresolved trauma.  She also will exercise for her health and her mental health - to cope with stress better.  Long:  Chelsea Ramsey will make positive self-care choices with diet and exercise to improve her health and mental health.    Continue Psychosocial Services   Follow up required by staff       Psychosocial Re-Evaluation:   Psychosocial Discharge (Final Psychosocial Re-Evaluation):   Education: Education Goals: Education classes will be provided on a weekly basis, covering required topics. Participant will state understanding/return demonstration of topics presented.  Learning Barriers/Preferences: Learning Barriers/Preferences - 01/16/19 1242      Learning Barriers/Preferences   Learning Barriers  None    Learning Preferences  Individual Instruction       Education Topics:  Initial Evaluation Education: - Verbal, written and demonstration of respiratory meds, oximetry and breathing techniques. Instruction on use of nebulizers and MDIs and importance of monitoring MDI activations.   General Nutrition Guidelines/Fats and Fiber: -Group instruction provided by verbal, written material, models and posters to present the general guidelines for heart healthy nutrition. Gives an explanation  and review of dietary fats and fiber.   Controlling Sodium/Reading Food Labels: -Group verbal and written material supporting the discussion of sodium use in heart healthy nutrition. Review and explanation with models, verbal and written  materials for utilization of the food label.   Exercise Physiology & General Exercise Guidelines: - Group verbal and written instruction with models to review the exercise physiology of the cardiovascular system and associated critical values. Provides general exercise guidelines with specific guidelines to those with heart or lung disease.    Aerobic Exercise & Resistance Training: - Gives group verbal and written instruction on the various components of exercise. Focuses on aerobic and resistive training programs and the benefits of this training and how to safely progress through these programs.   Flexibility, Balance, Mind/Body Relaxation: Provides group verbal/written instruction on the benefits of flexibility and balance training, including mind/body exercise modes such as yoga, pilates and tai chi.  Demonstration and skill practice provided.   Stress and Anxiety: - Provides group verbal and written instruction about the health risks of elevated stress and causes of high stress.  Discuss the correlation between heart/lung disease and anxiety and treatment options. Review healthy ways to manage with stress and anxiety.   Cardiac Rehab from 10/11/2018 in [facility] Cardiac and Pulmonary Rehab  Date  09/13/18  Educator  [facility]  Instruction Review Code  1- Verbalizes Understanding      Depression: - Provides group verbal and written instruction on the correlation between heart/lung disease and depressed mood, treatment options, and the stigmas associated with seeking treatment.   Exercise & Equipment Safety: - Individual verbal instruction and demonstration of equipment use and safety with use of the equipment.   Cardiac Rehab from 10/11/2018 in [facility] Cardiac and Pulmonary Rehab  Date  08/25/18  Educator  [facility]  Instruction Review Code  1- Verbalizes Understanding      Infection Prevention: - Provides verbal and written material to individual with discussion of infection control including  proper hand washing and proper equipment cleaning during exercise session.   Cardiac Rehab from 10/11/2018 in [facility] Cardiac and Pulmonary Rehab  Date  08/25/18  Educator  [facility]  Instruction Review Code  1- Verbalizes Understanding      Falls Prevention: - Provides verbal and written material to individual with discussion of falls prevention and safety.   Cardiac Rehab from 10/11/2018 in [facility] Cardiac and Pulmonary Rehab  Date  08/25/18  Educator  [facility]  Instruction Review Code  1- Verbalizes Understanding      Diabetes: - Individual verbal and written instruction to review signs/symptoms of diabetes, desired ranges of glucose level fasting, after meals and with exercise. Advice that pre and post exercise glucose checks will be done for 3 sessions at entry of program.   Chronic Lung Diseases: - Group verbal and written instruction to review updates, respiratory medications, advancements in procedures and treatments. Discuss use of supplemental oxygen including available portable oxygen systems, continuous and intermittent flow rates, concentrators, personal use and safety guidelines. Review proper use of inhaler and spacers. Provide informative websites for self-education.    Energy Conservation: - Provide group verbal and written instruction for methods to conserve energy, plan and organize activities. Instruct on pacing techniques, use of adaptive equipment and posture/positioning to relieve shortness of breath.   Triggers and Exacerbations: - Group verbal and written instruction to review types of environmental triggers and ways to prevent exacerbations. Discuss weather changes, air quality and the benefits of nasal washing. Review warning signs  and symptoms to help prevent infections. Discuss techniques for effective airway clearance, coughing, and vibrations.   AED/CPR: - Group verbal and written instruction with the use of models to demonstrate the basic use of the AED with the basic  ABC's of resuscitation.   Anatomy and Physiology of the Lungs: - Group verbal and written instruction with the use of models to provide basic lung anatomy and physiology related to function, structure and complications of lung disease.   Anatomy & Physiology of the Heart: - Group verbal and written instruction and models provide basic cardiac anatomy and physiology, with the coronary electrical and arterial systems. Review of Valvular disease and Heart Failure   Cardiac Medications: - Group verbal and written instruction to review commonly prescribed medications for heart disease. Reviews the medication, class of the drug, and side effects.   Know Your Numbers and Risk Factors: -Group verbal and written instruction about important numbers in your health.  Discussion of what are risk factors and how they play a role in the disease process.  Review of Cholesterol, Blood Pressure, Diabetes, and BMI and the role they play in your overall health.   Cardiac Rehab from 10/11/2018 in [facility] Cardiac and Pulmonary Rehab  Date  09/08/18  Educator  [facility]  Instruction Review Code  1- Verbalizes Understanding      Sleep Hygiene: -Provides group verbal and written instruction about how sleep can affect your health.  Define sleep hygiene, discuss sleep cycles and impact of sleep habits. Review good sleep hygiene tips.    Cardiac Rehab from 10/11/2018 in [facility] Pul[facility] Re[facility]  Date  10/11/18  Educator  [facility]  Instruction Review Code  1- Verbalizes Understanding      Other: -Provides group and verbal instruction on various topics (see comments)    Knowledge Questionnaire Score: Knowledge Questionnaire Score - 01/16/19 1242      Knowledge Questionnaire Score   Pre Score  15/18   reviewed with pt today       Core Components/Risk Factors/Patient Goals at Admission: Personal Goals and Risk Factors at Admission - 01/16/19 1243      Core Components/Risk Factors/Patient Goals on Admission     Weight Management  Yes;Obesity;Weight Loss    Intervention  Weight Management: Develop a combined nutrition and exercise program designed to reach desired caloric intake, while maintaining appropriate intake of nutrient and fiber, sodium and fats, and appropriate energy expenditure required for the weight goal.;Weight Management: Provide education and appropriate resources to help participant work on and attain dietary goals.;Weight Management/Obesity: Establish reasonable short term and long term weight goals.;Obesity: Provide education and appropriate resources to help participant work on and attain dietary goals.    Admit Weight  273 lb 4.8 oz (124 kg)    Goal Weight: Short Term  268 lb (121.6 kg)    Goal Weight: Long Term  220 lb (99.8 kg)    Expected Outcomes  Short Term: Continue to assess and modify interventions until short term weight is achieved;Long Term: Adherence to nutrition and physical activity/exercise program aimed toward attainment of established weight goal;Weight Loss: Understanding of general recommendations for a balanced deficit meal plan, which promotes 1-2 lb weight loss per week and includes a negative energy balance of [phone] kcal/d;Understanding recommendations for meals to include 15-35% energy as protein, 25-35% energy from fat, 35-60% energy from carbohydrates, less than '200mg'$  of dietary cholesterol, 20-35 gm of total fiber daily;Understanding of distribution of calorie intake throughout the day with the consumption of  4-5 meals/snacks    Tobacco Cessation  Yes    Number of packs per day  1-2 cigarettes a day    Intervention  Assist the participant in steps to quit. Provide individualized education and counseling about committing to Tobacco Cessation, relapse prevention, and pharmacological support that can be provided by physician.;Advice worker, assist with locating and accessing local/national Quit Smoking programs, and support quit date choice.    Quit Date: 02/14/19   Expected Outcomes  Short Term: Will demonstrate readiness to quit, by selecting a quit date.;Short Term: Will quit all tobacco product use, adhering to prevention of relapse plan.;Long Term: Complete abstinence from all tobacco products for at least 12 months from quit date.    Improve shortness of breath with ADL's  Yes    Intervention  Provide education, individualized exercise plan and daily activity instruction to help decrease symptoms of SOB with activities of daily living.    Expected Outcomes  Short Term: Improve cardiorespiratory fitness to achieve a reduction of symptoms when performing ADLs;Long Term: Be able to perform more ADLs without symptoms or delay the onset of symptoms    Heart Failure  Yes    Intervention  Provide a combined exercise and nutrition program that is supplemented with education, support and counseling about heart failure. Directed toward relieving symptoms such as shortness of breath, decreased exercise tolerance, and extremity edema.    Expected Outcomes  Improve functional capacity of life;Short term: Attendance in program 2-3 days a week with increased exercise capacity. Reported lower sodium intake. Reported increased fruit and vegetable intake. Reports medication compliance.;Short term: Daily weights obtained and reported for increase. Utilizing diuretic protocols set by physician.;Long term: Adoption of self-care skills and reduction of barriers for early signs and symptoms recognition and intervention leading to self-care maintenance.    Hypertension  Yes    Intervention  Provide education on lifestyle modifcations including regular physical activity/exercise, weight management, moderate sodium restriction and increased consumption of fresh fruit, vegetables, and low fat dairy, alcohol moderation, and smoking cessation.;Monitor prescription use compliance.    Expected Outcomes  Short Term: Continued assessment and intervention until BP is <  140/67m HG in hypertensive participants. < 130/871mHG in hypertensive participants with diabetes, heart failure or chronic kidney disease.;Long Term: Maintenance of blood pressure at goal levels.       Core Components/Risk Factors/Patient Goals Review:  Goals and Risk Factor Review    Row Name 09/27/18 1003             Core Components/Risk Factors/Patient Goals Review   Personal Goals Review  Heart Failure;Weight Management/Obesity;Hypertension;Improve shortness of breath with ADL's       Review  SaJolaine Clicks off to a good start in rehab. She has already started to lose some weight and is down to 156lbs.  She has cut back on soda and candy bars.  She weighs daily and checks blood pressure and saturations daily as well!!  She has been having some shortness of breath and is using her oxygen at night and occasionally during the day. She still has some incisional pain but knows that she is getting better.  Still within her recovery window.  She is doing well with her medicaitons.        Expected Outcomes  Short: Continue to weigh daily and work weight loss.  Long: Continue to manage heart failure.           Core Components/Risk Factors/Patient Goals at Discharge (Final Review):  Goals and  Risk Factor Review - 09/27/18 1003      Core Components/Risk Factors/Patient Goals Review   Personal Goals Review  Heart Failure;Weight Management/Obesity;Hypertension;Improve shortness of breath with ADL's    Review  Chelsea Ramsey is off to a good start in rehab. She has already started to lose some weight and is down to 156lbs.  She has cut back on soda and candy bars.  She weighs daily and checks blood pressure and saturations daily as well!!  She has been having some shortness of breath and is using her oxygen at night and occasionally during the day. She still has some incisional pain but knows that she is getting better.  Still within her recovery window.  She is doing well with her medicaitons.     Expected Outcomes   Short: Continue to weigh daily and work weight loss.  Long: Continue to manage heart failure.        ITP Comments: ITP Comments    Row Name 08/25/18 1531 10/11/18 1304 11/25/18 0941 11/29/18 0625 12/06/18 1420   ITP Comments  Med Review completed. Initial ITP created. Diagnosis can be found in [facility] 7/22  Pt experienced dizziness while walking on the treadmill. Assisted patient to seating. Patient then reported nausea and chest pain. She was taken to the Emergency Department for further evaluation. BP 148/80, O2 100%, HR 90.  Waiting for clearance to return to rehab.  Has a follow up appt with heart failure clinic on 12/02/18.    30 Day Review. Continue with ITP unless directed changes per Medical Director review Remains out for medical concerns  Chelsea Ramsey is currently admitted for CHF and respiratory failure.  She has seen Diane as inpatient and knows that she needs clearance to return to rehab.     [place] Name 12/20/18 1340 01/16/19 1437         ITP Comments  Called to check on Chelsea Ramsey.  She has been out since 11/12.  She continues to have on going issues and still needs clearance to return.  We will discharge her at this time with the hope that once her health recovers she will be able to return to finish the program.   Medical evaluation and 6MWT completed today.  Documentation for diaganosis can be found in [place] encounter 01/03/19 with Dr. Saralyn Pilar.  Initial ITP created and sent for review to [name], Medical Director.          Comments: Initial ITP

## 2019-01-16 NOTE — Patient Instructions (Signed)
Patient Instructions  Patient Details  Name: Chelsea Ramsey MRN: [id number] Date of Birth: 1966/09/17 Referring Provider:  Isaias Cowman, MD  Below are your personal goals for exercise, nutrition, and risk factors. Our goal is to help you stay on track towards obtaining and maintaining these goals. We will be discussing your progress on these goals with you throughout the program.  Initial Exercise Prescription: Initial Exercise Prescription - 01/16/19 1400      Date of Initial Exercise RX and Referring Provider   Date  01/16/19    Referring Provider  Paraschos, Alexander MD      Treadmill   MPH  1.5    Grade  0.5    Minutes  15    METs  2.25      NuStep   Level  2    SPM  80    Minutes  15    METs  2.2      REL-XR   Level  1    Speed  50    Minutes  15    METs  2.2      Prescription Details   Frequency (times per week)  3    Duration  Progress to 45 minutes of aerobic exercise without signs/symptoms of physical distress      Intensity   THRR 40-80% of Max Heartrate  122*153    Ratings of Perceived Exertion  11-13    Perceived Dyspnea  0-4      Progression   Progression  Continue to progress workloads to maintain intensity without signs/symptoms of physical distress.      Resistance Training   Training Prescription  Yes    Weight  3 lbs    Reps  10-15       Exercise Goals: Frequency: Be able to perform aerobic exercise two to three times per week in program working toward 2-5 days per week of home exercise.  Intensity: Work with a perceived exertion of 11 (fairly light) - 15 (hard) while following your exercise prescription.  We will make changes to your prescription with you as you progress through the program.   Duration: Be able to do 30 to 45 minutes of continuous aerobic exercise in addition to a 5 minute warm-up and a 5 minute cool-down routine.   Nutrition Goals: Your personal nutrition goals will be established when you do your nutrition  analysis with the dietician.  The following are general nutrition guidelines to follow: Cholesterol < 200mg /day Sodium < 1500mg /day Fiber: Women over 50 yrs - 21 grams per day  Personal Goals: Personal Goals and Risk Factors at Admission - 01/16/19 1243      Core Components/Risk Factors/Patient Goals on Admission    Weight Management  Yes;Obesity;Weight Loss    Intervention  Weight Management: Develop a combined nutrition and exercise program designed to reach desired caloric intake, while maintaining appropriate intake of nutrient and fiber, sodium and fats, and appropriate energy expenditure required for the weight goal.;Weight Management: Provide education and appropriate resources to help participant work on and attain dietary goals.;Weight Management/Obesity: Establish reasonable short term and long term weight goals.;Obesity: Provide education and appropriate resources to help participant work on and attain dietary goals.    Admit Weight  273 lb 4.8 oz (124 kg)    Goal Weight: Short Term  268 lb (121.6 kg)    Goal Weight: Long Term  220 lb (99.8 kg)    Expected Outcomes  Short Term: Continue to assess and modify interventions  until short term weight is achieved;Long Term: Adherence to nutrition and physical activity/exercise program aimed toward attainment of established weight goal;Weight Loss: Understanding of general recommendations for a balanced deficit meal plan, which promotes 1-2 lb weight loss per week and includes a negative energy balance of [id number] kcal/d;Understanding recommendations for meals to include 15-35% energy as protein, 25-35% energy from fat, 35-60% energy from carbohydrates, less than 200mg  of dietary cholesterol, 20-35 gm of total fiber daily;Understanding of distribution of calorie intake throughout the day with the consumption of 4-5 meals/snacks    Tobacco Cessation  Yes    Number of packs per day  1-2 cigarettes a day    Intervention  Assist the participant in  steps to quit. Provide individualized education and counseling about committing to Tobacco Cessation, relapse prevention, and pharmacological support that can be provided by physician.;Advice worker, assist with locating and accessing local/national Quit Smoking programs, and support quit date choice.   Quit Date: 02/14/19   Expected Outcomes  Short Term: Will demonstrate readiness to quit, by selecting a quit date.;Short Term: Will quit all tobacco product use, adhering to prevention of relapse plan.;Long Term: Complete abstinence from all tobacco products for at least 12 months from quit date.    Improve shortness of breath with ADL's  Yes    Intervention  Provide education, individualized exercise plan and daily activity instruction to help decrease symptoms of SOB with activities of daily living.    Expected Outcomes  Short Term: Improve cardiorespiratory fitness to achieve a reduction of symptoms when performing ADLs;Long Term: Be able to perform more ADLs without symptoms or delay the onset of symptoms    Heart Failure  Yes    Intervention  Provide a combined exercise and nutrition program that is supplemented with education, support and counseling about heart failure. Directed toward relieving symptoms such as shortness of breath, decreased exercise tolerance, and extremity edema.    Expected Outcomes  Improve functional capacity of life;Short term: Attendance in program 2-3 days a week with increased exercise capacity. Reported lower sodium intake. Reported increased fruit and vegetable intake. Reports medication compliance.;Short term: Daily weights obtained and reported for increase. Utilizing diuretic protocols set by physician.;Long term: Adoption of self-care skills and reduction of barriers for early signs and symptoms recognition and intervention leading to self-care maintenance.    Hypertension  Yes    Intervention  Provide education on lifestyle modifcations including  regular physical activity/exercise, weight management, moderate sodium restriction and increased consumption of fresh fruit, vegetables, and low fat dairy, alcohol moderation, and smoking cessation.;Monitor prescription use compliance.    Expected Outcomes  Short Term: Continued assessment and intervention until BP is < 140/36mm HG in hypertensive participants. < 130/49mm HG in hypertensive participants with diabetes, heart failure or chronic kidney disease.;Long Term: Maintenance of blood pressure at goal levels.       Tobacco Use Initial Evaluation: Social History   Tobacco Use  Smoking Status Current Every Day Smoker  . Packs/day: 0.25  . Types: Cigarettes  Smokeless Tobacco Never Used  Tobacco Comment   01/16/19 still smoking 1-2 per day.  given fake cigarette to help    Exercise Goals and Review: Exercise Goals    Row Name 08/25/18 1500 01/16/19 1443           Exercise Goals   Increase Physical Activity  Yes  Yes      Intervention  Provide advice, education, support and counseling about physical activity/exercise needs.;Develop an individualized exercise prescription  for aerobic and resistive training based on initial evaluation findings, risk stratification, comorbidities and participant's personal goals.  Provide advice, education, support and counseling about physical activity/exercise needs.;Develop an individualized exercise prescription for aerobic and resistive training based on initial evaluation findings, risk stratification, comorbidities and participant's personal goals.      Expected Outcomes  Short Term: Attend rehab on a regular basis to increase amount of physical activity.;Long Term: Add in home exercise to make exercise part of routine and to increase amount of physical activity.;Long Term: Exercising regularly at least 3-5 days a week.  Short Term: Attend rehab on a regular basis to increase amount of physical activity.;Long Term: Add in home exercise to make exercise  part of routine and to increase amount of physical activity.;Long Term: Exercising regularly at least 3-5 days a week.      Increase Strength and Stamina  Yes  Yes      Intervention  Provide advice, education, support and counseling about physical activity/exercise needs.;Develop an individualized exercise prescription for aerobic and resistive training based on initial evaluation findings, risk stratification, comorbidities and participant's personal goals.  Provide advice, education, support and counseling about physical activity/exercise needs.;Develop an individualized exercise prescription for aerobic and resistive training based on initial evaluation findings, risk stratification, comorbidities and participant's personal goals.      Expected Outcomes  Short Term: Increase workloads from initial exercise prescription for resistance, speed, and METs.;Short Term: Perform resistance training exercises routinely during rehab and add in resistance training at home;Long Term: Improve cardiorespiratory fitness, muscular endurance and strength as measured by increased METs and functional capacity (6MWT)  Short Term: Increase workloads from initial exercise prescription for resistance, speed, and METs.;Short Term: Perform resistance training exercises routinely during rehab and add in resistance training at home;Long Term: Improve cardiorespiratory fitness, muscular endurance and strength as measured by increased METs and functional capacity (6MWT)      Able to understand and use rate of perceived exertion (RPE) scale  Yes  Yes      Intervention  Provide education and explanation on how to use RPE scale  Provide education and explanation on how to use RPE scale      Expected Outcomes  Short Term: Able to use RPE daily in rehab to express subjective intensity level;Long Term:  Able to use RPE to guide intensity level when exercising independently  Short Term: Able to use RPE daily in rehab to express subjective  intensity level;Long Term:  Able to use RPE to guide intensity level when exercising independently      Able to understand and use Dyspnea scale  Yes  Yes      Intervention  Provide education and explanation on how to use Dyspnea scale  Provide education and explanation on how to use Dyspnea scale      Expected Outcomes  Short Term: Able to use Dyspnea scale daily in rehab to express subjective sense of shortness of breath during exertion;Long Term: Able to use Dyspnea scale to guide intensity level when exercising independently  Short Term: Able to use Dyspnea scale daily in rehab to express subjective sense of shortness of breath during exertion;Long Term: Able to use Dyspnea scale to guide intensity level when exercising independently      Knowledge and understanding of Target Heart Rate Range (THRR)  Yes  Yes      Intervention  Provide education and explanation of THRR including how the numbers were predicted and where they are located for reference  Provide  education and explanation of THRR including how the numbers were predicted and where they are located for reference      Expected Outcomes  Short Term: Able to state/look up THRR;Short Term: Able to use daily as guideline for intensity in rehab;Long Term: Able to use THRR to govern intensity when exercising independently  Short Term: Able to state/look up THRR;Short Term: Able to use daily as guideline for intensity in rehab;Long Term: Able to use THRR to govern intensity when exercising independently      Able to check pulse independently  Yes  Yes      Intervention  Provide education and demonstration on how to check pulse in carotid and radial arteries.;Review the importance of being able to check your own pulse for safety during independent exercise  Provide education and demonstration on how to check pulse in carotid and radial arteries.;Review the importance of being able to check your own pulse for safety during independent exercise       Expected Outcomes  Short Term: Able to explain why pulse checking is important during independent exercise;Long Term: Able to check pulse independently and accurately  Short Term: Able to explain why pulse checking is important during independent exercise;Long Term: Able to check pulse independently and accurately      Understanding of Exercise Prescription  Yes  Yes      Intervention  Provide education, explanation, and written materials on patient's individual exercise prescription  Provide education, explanation, and written materials on patient's individual exercise prescription      Expected Outcomes  Short Term: Able to explain program exercise prescription;Long Term: Able to explain home exercise prescription to exercise independently  Short Term: Able to explain program exercise prescription;Long Term: Able to explain home exercise prescription to exercise independently         Copy of goals given to participant.

## 2019-01-16 NOTE — Progress Notes (Signed)
Daily Session Note  Patient Details  Name: Chelsea Ramsey MRN: [id number] Date of Birth: 1966/09/02 Referring Provider:     Pulmonary Rehab from 01/16/2019 in [facility]  Referring Provider  Isaias Cowman MD      Encounter Date: 01/16/2019  Check In: Session Check In - 01/16/19 1230      Check-In   Supervising physician immediately available to respond to emergencies  [facility] immediately available ER MD    Physician(s)  Drs. Katherine Basset    Location  [facility]-Cardiac & Pulmonary Rehab    Staff Present  Alberteen Sam, MA, RCEP, CCRP, Exercise Physiologist;Joseph Tessie Fass RCP,RRT,BSRT    Medication changes reported      No    Fall or balance concerns reported     No    Tobacco Cessation  Use Increase   still smoking 1-2 cigarette a day   Warm-up and Cool-down  Not performed (comment)   orientation and walk test   Resistance Training Performed  No    VAD Patient?  No    PAD/SET Patient?  No      Pain Assessment   Currently in Pain?  No/denies        Exercise Prescription Changes - 01/16/19 1400      Response to Exercise   Blood Pressure (Admit)  124/72    Blood Pressure (Exercise)  134/74    Blood Pressure (Exit)  124/70    Heart Rate (Admit)  90 bpm    Heart Rate (Exercise)  108 bpm    Heart Rate (Exit)  98 bpm    Oxygen Saturation (Admit)  95 %    Oxygen Saturation (Exercise)  87 %    Oxygen Saturation (Exit)  98 %    Rating of Perceived Exertion (Exercise)  15    Perceived Dyspnea (Exercise)  2    Symptoms  SOB, back/leg pain 8/10    Comments  walk test results       Social History   Tobacco Use  Smoking Status Current Every Day Smoker  . Packs/day: 0.25  . Types: Cigarettes  Smokeless Tobacco Never Used  Tobacco Comment   01/16/19 still smoking 1-2 per day.  given fake cigarette to help    Goals Met:  Exercise tolerated well Personal goals reviewed Queuing for purse lip breathing No report of cardiac concerns or  symptoms Strength training completed today  Goals Unmet:  Not Applicable  Comments: Medical evaluation and 6MWT completed   [name] is Medical Director for [place] and [facility].

## 2019-01-18 ENCOUNTER — Ambulatory Visit
Admission: RE | Admit: 2019-01-18 | Discharge: 2019-01-18 | Disposition: A | Discharge status: Home / Self Care | Payer: Medicaid Other | Source: Ambulatory Visit | Attending: Pain Medicine | Admitting: Pain Medicine

## 2019-01-18 ENCOUNTER — Ambulatory Visit: Payer: Medicaid Other | Admitting: Pain Medicine

## 2019-01-18 ENCOUNTER — Encounter: Payer: Self-pay | Admitting: Pain Medicine

## 2019-01-18 ENCOUNTER — Other Ambulatory Visit: Payer: Self-pay

## 2019-01-18 VITALS — BP 111/70 | HR 85 | Temp 97.9°F | Ht 64.0 in | Wt 273.0 lb

## 2019-01-18 DIAGNOSIS — G894 Chronic pain syndrome: Secondary | ICD-10-CM | POA: Insufficient documentation

## 2019-01-18 DIAGNOSIS — M79605 Pain in left leg: Secondary | ICD-10-CM

## 2019-01-18 DIAGNOSIS — M1A9XX0 Chronic gout, unspecified, without tophus (tophi): Secondary | ICD-10-CM

## 2019-01-18 DIAGNOSIS — Z87898 Personal history of other specified conditions: Secondary | ICD-10-CM | POA: Insufficient documentation

## 2019-01-18 DIAGNOSIS — M461 Sacroiliitis, not elsewhere classified: Secondary | ICD-10-CM

## 2019-01-18 DIAGNOSIS — M545 Low back pain: Secondary | ICD-10-CM | POA: Insufficient documentation

## 2019-01-18 DIAGNOSIS — G8929 Other chronic pain: Secondary | ICD-10-CM | POA: Insufficient documentation

## 2019-01-18 DIAGNOSIS — R0789 Other chest pain: Secondary | ICD-10-CM

## 2019-01-18 DIAGNOSIS — Z7901 Long term (current) use of anticoagulants: Secondary | ICD-10-CM

## 2019-01-18 DIAGNOSIS — M25552 Pain in left hip: Secondary | ICD-10-CM

## 2019-01-18 DIAGNOSIS — M5388 Other specified dorsopathies, sacral and sacrococcygeal region: Secondary | ICD-10-CM | POA: Insufficient documentation

## 2019-01-18 DIAGNOSIS — M47818 Spondylosis without myelopathy or radiculopathy, sacral and sacrococcygeal region: Secondary | ICD-10-CM

## 2019-01-18 DIAGNOSIS — M797 Fibromyalgia: Secondary | ICD-10-CM

## 2019-01-18 DIAGNOSIS — M25561 Pain in right knee: Secondary | ICD-10-CM | POA: Insufficient documentation

## 2019-01-18 DIAGNOSIS — F1491 Cocaine use, unspecified, in remission: Secondary | ICD-10-CM | POA: Insufficient documentation

## 2019-01-18 DIAGNOSIS — M533 Sacrococcygeal disorders, not elsewhere classified: Secondary | ICD-10-CM | POA: Insufficient documentation

## 2019-01-18 MED ORDER — OXYCODONE HCL 10 MG PO TABS: 5.0000 mg | ORAL_TABLET | Freq: Two times a day (BID) | ORAL | 0 refills | Status: DC | PRN

## 2019-01-18 NOTE — Progress Notes (Signed)
Patient's Name: Chelsea Ramsey  MRN: [id number]  Referring Provider: Kathee Delton, MD  DOB: [birth year]  PCP: Kathee Delton, MD  DOS: 01/18/2019  Note by: Gaspar Cola, MD  Service setting: Ambulatory outpatient  Specialty: Interventional Pain Management  Location: [facility] (AMB) Pain Management Facility    Patient type: Established   Primary Reason(s) for Visit: Encounter for post-procedure evaluation of chronic illness with mild to moderate exacerbation CC: Back Pain  HPI  Ms. Beem is a 53 y.o. year old, female patient, who comes today for a post-procedure evaluation. She has Osteoarthritis of knee (Right); COPD (chronic obstructive pulmonary disease) with acute bronchitis (HCC); COPD (chronic obstructive pulmonary disease) ([place]); A-fib [facility]); GIB (gastrointestinal bleeding); Intractable vomiting with nausea; Melena; Nausea and vomiting; Acute esophagogastric ulcer; Acute CHF (congestive heart failure) ([place]); Chronic diastolic heart failure ([place]); HTN (hypertension); Chronic low back pain (Primary Area of Pain) (Bilateral) w/ sciatica (Left); Chronic lower extremity pain (Secondary Area of Pain) (Left); Sternal pain (Tertiary Area of Pain); Fibromyalgia (Fourth Area of Pain); Chronic knee pain (Fifth Area of Pain) (Right); Chronic sacroiliac joint pain (Bilateral) (L>R); Chronic pain syndrome; Long term current use of opiate analgesic; Pharmacologic therapy; Disorder of skeletal system; Problems influencing health status; Chest pain; Class 3 severe obesity with body mass index (BMI) of 45.0 to 49.9 in adult [facility]); Chronic anticoagulation (XARELTO); Vitamin D deficiency; Chronic knee pain after total replacement (Right); CAP (community acquired pneumonia); CHF (congestive heart failure) ([place]); Elevated sed rate; Elevated C-reactive protein; Diplopia; Dizziness; Headache, chronic daily; Numbness and tingling of both feet; Spondylosis without myelopathy or radiculopathy, lumbar region;  Lumbar facet syndrome (Bilateral); DDD (degenerative disc disease), lumbar; Chronic low back pain (Primary Area of Pain) (Bilateral) (L>R) w/o sciatica; Abnormal MRI, lumbar spine (01/05/2018); Lumbar facet arthropathy (Bilateral); Acute drug-induced gout of left foot; Arthritis of right knee; Depression; GAD (generalized anxiety disorder); History of substance abuse ([place]); Insomnia; Moderate to severe pulmonary hypertension ([place]); Rheumatic heart disease, unspecified; S/P MVR (mitral valve replacement); Shortness of breath; Tobacco dependence; History of cocaine use; Chronic gout involving toe of foot, unspecified cause (Left); Chronic hip pain (Left); Osteoarthritis of sacroiliac joints (Bilateral); and Other specified dorsopathies, sacral and sacrococcygeal region on their problem list. Her primarily concern today is the Back Pain  Pain Assessment: Location: Lower Back Radiating: pain radiaties down left leg Onset: More than a month ago Duration: Chronic pain Quality: Constant, Crushing, Numbness Severity: 7 /10 (subjective, self-reported pain score)  Note: Reported level is inconsistent with clinical observations. Clinically the patient looks like a 3/10 A 3/10 is viewed as "Moderate" and described as significantly interfering with activities of daily living (ADL). It becomes difficult to feed, bathe, get dressed, get on and off the toilet or to perform personal hygiene functions. Difficult to get in and out of bed or a chair without assistance. Very distracting. With effort, it can be ignored when deeply involved in activities. Information on the proper use of the pain scale provided to the patient today. When using our objective Pain Scale, levels between 6 and 10/10 are said to belong in an emergency room, as it progressively worsens from a 6/10, described as severely limiting, requiring emergency care not usually available at an outpatient pain management facility. At a 6/10 level, communication  becomes difficult and requires great effort. Assistance to reach the emergency department may be required. Facial flushing and profuse sweating along with potentially dangerous increases in heart rate and blood pressure will be evident. Effect  on ADL: limits my daily activities Timing: Constant Modifying factors: nothing BP: 111/70  HR: 85  Ms. Mckethan comes in today for post-procedure evaluation.  The patient returns to the clinic today indicating having attained complete relief of her lower extremity pain, but only partial relief of the lower back pain.  This would suggest that the leg pain is referred from the lumbar facets, but it also would suggest that the facet joints are not the only etiology of the pain.  Today I have reviewed her case and the physical exam today shows that she has exquisite tenderness over the area of the SI joint, bilaterally upon performing the [facility] maneuver.  She also demonstrated having left hip arthralgia with decreased range of motion of both the hips and the knee joint.  Today I will be ordering some x-rays of her left hip and I will schedule her to come back for a diagnostic bilateral sacroiliac joint block under fluoroscopic guidance and IV sedation, after having stopped her Xarelto for 3 days.  This 1 we will also do without any steroids, so asked to avoid worsening her CHF.  Further details on both, my assessment(s), as well as the proposed treatment plan, please see below.  Post-Procedure Assessment  01/05/2019 Procedure: Diagnostic bilateral lumbar facet block #1 under fluoroscopic guidance and IV sedation (NO STEROIDS) Pre-procedure pain score:  8/10 Post-procedure pain score: 6/10 (< 50% relief) Influential Factors: BMI: 46.86 kg/m Intra-procedural challenges: None observed.         Assessment challenges: None detected.              Reported side-effects: None.        Post-procedural adverse reactions or complications: None reported         Sedation:  Sedation provided. When no sedatives are used, the analgesic levels obtained are directly associated to the effectiveness of the local anesthetics. However, when sedation is provided, the level of analgesia obtained during the initial 1 hour following the intervention, is believed to be the result of a combination of factors. These factors may include, but are not limited to: 1. The effectiveness of the local anesthetics used. 2. The effects of the analgesic(s) and/or anxiolytic(s) used. 3. The degree of discomfort experienced by the patient at the time of the procedure. 4. The patients ability and reliability in recalling and recording the events. 5. The presence and influence of possible secondary gains and/or psychosocial factors. Reported result: Relief experienced during the 1st hour after the procedure: 100 % (Ultra-Short Term Relief) Ms. Schlender has indicated area to have been numb during this time. Interpretative annotation: Clinically appropriate result. No IV Analgesic or Anxiolytic given, therefore benefits are completely due to Local Anesthetic effects.          Effects of local anesthetic: The analgesic effects attained during this period are directly associated to the localized infiltration of local anesthetics and therefore cary significant diagnostic value as to the etiological location, or anatomical origin, of the pain. Expected duration of relief is directly dependent on the pharmacodynamics of the local anesthetic used. Long-acting (4-6 hours) anesthetics used.  Reported result: Relief during the next 4 to 6 hour after the procedure: 100 % (Short-Term Relief)            Interpretative annotation: Clinically appropriate result. Analgesia during this period is likely to be Local Anesthetic-related.          Long-term benefit: Defined as the period of time past the expected duration of local  anesthetics (1 hour for short-acting and 4-6 hours for long-acting). With the possible  exception of prolonged sympathetic blockade from the local anesthetics, benefits during this period are typically attributed to, or associated with, other factors such as analgesic sensory neuropraxia, antiinflammatory effects, or beneficial biochemical changes provided by agents other than the local anesthetics.  Reported result: Extended relief following procedure: 40 %(1 week) (Long-Term Relief) Ms. Hartje reports the extremity pain improved more than the axial pain. Interpretative annotation: Clinically possible results. Partial relief. Incomplete therapeutic success. Ms. Bischoff requested to have NO STEROIDS injected.          Current benefits: Defined as reported results that persistent at this point in time.   Analgesia: 0-25 %            Function: Back to baseline ROM: Back to baseline Interpretative annotation: Recurrence of symptoms. No long-term benefit expected. Results would suggest persistent aggravating factors.          Interpretation: Results would suggest a successful diagnostic intervention.                  Plan:  Please see "Plan of Care" for details.                Controlled Substance Pharmacotherapy Assessment REMS (Risk Evaluation and Mitigation Strategy)  Analgesic: Oxycodone 10 mg 1 tablet p.o. twice daily MME/day: 30 mg/day.  Chauncey Fischer, RN  01/18/2019  9:49 AM  Sign when Signing Visit Nursing Pain Medication Assessment:  Safety precautions to be maintained throughout the outpatient stay will include: orient to surroundings, keep bed in low position, maintain call bell within reach at all times, provide assistance with transfer out of bed and ambulation.  Medication Inspection Compliance: Pill count conducted under aseptic conditions, in front of the patient. Neither the pills nor the bottle was removed from the patient's sight at any time. Once count was completed pills were immediately returned to the patient in their original bottle.  Medication: Oxycodone  IR Pill/Patch Count: 3 of 60 pills remain Pill/Patch Appearance: Markings consistent with prescribed medication Bottle Appearance: Standard pharmacy container. Clearly labeled. Filled Date: 1 / 22 / 2020 Last Medication intake:  Today   Pharmacokinetics: Liberation and absorption (onset of action): WNL Distribution (time to peak effect): WNL Metabolism and excretion (duration of action): WNL         Pharmacodynamics: Desired effects: Analgesia: Ms. Orsak reports >50% benefit. Functional ability: Patient reports that medication allows her to accomplish basic ADLs Clinically meaningful improvement in function (CMIF): Sustained CMIF goals met Perceived effectiveness: Described as relatively effective, allowing for increase in activities of daily living (ADL) Undesirable effects: Side-effects or Adverse reactions: None reported Monitoring: [place] PMP: Online review of the past 91-monthperiod conducted. Compliant with practice rules and regulations Last UDS on record: Summary  Date Value Ref Range Status  12/19/2018 FINAL  Final    Comment:    ==================================================================== TOXASSURE SELECT 13 (MW) ==================================================================== Test                             Result       Flag       Units Drug Present   Oxycodone                      537  ng/mg creat   Oxymorphone                    2075                    ng/mg creat   Noroxycodone                   1445                    ng/mg creat   Noroxymorphone                 791                     ng/mg creat    Sources of oxycodone are scheduled prescription medications.    Oxymorphone, noroxycodone, and noroxymorphone are expected    metabolites of oxycodone. Oxymorphone is also available as a    scheduled prescription medication. ==================================================================== Test                      Result    Flag    Units      Ref Range   Creatinine              102              mg/dL      >=20 ==================================================================== Declared Medications:  Medication list was not provided. ==================================================================== For clinical consultation, please call [phone]. ====================================================================    UDS interpretation: Compliant          Medication Assessment Form: Reviewed. Patient indicates being compliant with therapy Treatment compliance: Compliant Risk Assessment Profile: Aberrant behavior: See initial evaluations. None observed or detected today Comorbid factors increasing risk of overdose: See initial evaluation. No additional risks detected today Opioid risk tool (ORT):  Opioid Risk  01/18/2019  Alcohol 0  Illegal Drugs 0  Rx Drugs 0  Alcohol 0  Illegal Drugs 0  Rx Drugs 0  Age between 16-45 years  0  History of Preadolescent Sexual Abuse 0  Psychological Disease 0  Depression 0  Opioid Risk Tool Scoring 0  Opioid Risk Interpretation Low Risk    ORT Scoring interpretation table:  Score <3 = Low Risk for SUD  Score between 4-7 = Moderate Risk for SUD  Score >8 = High Risk for Opioid Abuse   Risk of substance use disorder (SUD): Low  Risk Mitigation Strategies:  Patient Counseling: Covered Patient-Prescriber Agreement (PPA): Present and active  Notification to other healthcare providers: Done  Pharmacologic Plan: No change in therapy, at this time.              Laboratory Chemistry  Inflammation Markers (CRP: Acute Phase) (ESR: Chronic Phase) Lab Results  Component Value Date   CRP 11 (H) 10/10/2018   ESRSEDRATE 79 (H) 10/10/2018  NOTE: Patient found to have negative rheumatoid factor and ANA.  However, she also was found to have elevated levels of uric acid.  (Gout)  Renal Markers Lab Results  Component Value Date   BUN 12 12/27/2018   CREATININE 0.59  12/27/2018   BCR 18 10/10/2018   GFRAA >60 12/27/2018   GFRNONAA >60 12/27/2018                             Hepatic Markers Lab Results  Component Value Date   AST 18 12/01/2018  ALT 20 12/01/2018   ALBUMIN 3.7 12/01/2018                        Note: Lab results reviewed.  Recent Imaging Results   Results for orders placed in visit on 01/05/19  DG C-Arm 1-60 Min-No Report   Narrative Fluoroscopy was utilized by the requesting physician.  No radiographic  interpretation.    Interpretation Report: Fluoroscopy was used during the procedure to assist with needle guidance. The images were interpreted intraoperatively by the requesting physician.  Meds   Current Outpatient Medications:  .  bisacodyl (DULCOLAX) 5 MG EC tablet, Take 2 tablets (10 mg total) by mouth daily as needed for moderate constipation., Disp: 30 tablet, Rfl: 0 .  budesonide-formoterol (SYMBICORT) 160-4.5 MCG/ACT inhaler, Inhale 2 puffs into the lungs 2 (two) times daily., Disp: , Rfl:  .  butalbital-acetaminophen-caffeine (FIORICET, ESGIC) 50-325-40 MG tablet, Take 1 tablet by mouth as needed., Disp: , Rfl:  .  Calcium Carbonate-Vit D-Min (GNP CALCIUM 1200) 1200-1000 MG-UNIT CHEW, Chew 1,200 mg by mouth daily with breakfast. Take in combination with vitamin D and magnesium., Disp: 30 tablet, Rfl: 5 .  Cholecalciferol (VITAMIN D3) 125 MCG (5000 UT) CAPS, Take 1 capsule (5,000 Units total) by mouth daily with breakfast. Take along with calcium and magnesium., Disp: 30 capsule, Rfl: 5 .  colchicine 0.6 MG tablet, Take 1 tablet (0.6 mg total) by mouth 2 (two) times daily., Disp: 14 tablet, Rfl: 0 .  furosemide (LASIX) 40 MG tablet, Take 1 tablet (40 mg total) by mouth 2 (two) times daily., Disp: 60 tablet, Rfl: 11 .  gabapentin (NEURONTIN) 600 MG tablet, Take 0.5 tablets (300 mg total) by mouth 2 (two) times daily. (Patient taking differently: Take 600 mg by mouth 3 (three) times daily. ), Disp: 60 tablet, Rfl: 0 .   guaiFENesin (MUCINEX) 600 MG 12 hr tablet, Take 1 tablet (600 mg total) by mouth 2 (two) times daily., Disp: 20 tablet, Rfl: 0 .  lactulose (CHRONULAC) 10 GM/15ML solution, Take 45 mLs (30 g total) by mouth 2 (two) times daily as needed for mild constipation., Disp: 240 mL, Rfl: 0 .  magnesium oxide (MAG-OX) 400 MG tablet, Take 400 mg by mouth 2 (two) times daily., Disp: , Rfl:  .  meclizine (ANTIVERT) 12.5 MG tablet, Take 25 mg by mouth 3 (three) times daily as needed for dizziness., Disp: , Rfl:  .  metolazone (ZAROXOLYN) 2.5 MG tablet, Take 1 tablet (2.5 mg total) by mouth daily., Disp: 10 tablet, Rfl: 0 .  metoprolol tartrate (LOPRESSOR) 100 MG tablet, Take 1 tablet (100 mg total) by mouth 2 (two) times daily., Disp: 60 tablet, Rfl: 3 .  nicotine (NICODERM CQ - DOSED IN MG/24 HR) 7 mg/24hr patch, Place 1 patch (7 mg total) onto the skin daily., Disp: 28 patch, Rfl: 0 .  nitroGLYCERIN (NITROSTAT) 0.4 MG SL tablet, Place 1 tablet (0.4 mg total) under the tongue every 5 (five) minutes as needed for chest pain., Disp: 30 tablet, Rfl: 12 .  nortriptyline (PAMELOR) 10 MG capsule, Take 10 mg by mouth Nightly., Disp: , Rfl:  .  omeprazole (PRILOSEC) 40 MG capsule, Take 40 mg by mouth 2 (two) times daily., Disp: , Rfl:  .  [START ON 02/19/2019] Oxycodone HCl 10 MG TABS, Take 0.5-1 tablets (5-10 mg total) by mouth 2 (two) times daily as needed for up to 30 days. Must last 30 days. MAX.: 2/day, Disp: 60 tablet, Rfl:  0 .  potassium chloride SA (K-DUR,KLOR-CON) 20 MEQ tablet, Take 1 tablet (20 mEq total) by mouth daily. And additional tablet when taking metolazone, Disp: 45 tablet, Rfl: 5 .  predniSONE (DELTASONE) 10 MG tablet, Take 6 tablets day 1, take 5 tablets day 2, take 4 tablets day 3, take 3 tablets day 4, take 2 tablets day 5, take 1 tablet on day 6, Disp: 21 tablet, Rfl: 0 .  promethazine (PHENERGAN) 12.5 MG tablet, Take 1 tablet (12.5 mg total) by mouth every 6 (six) hours as needed for nausea or  vomiting., Disp: 30 tablet, Rfl: 0 .  rivaroxaban (XARELTO) 20 MG TABS tablet, Take 20 mg by mouth every evening. , Disp: , Rfl:  .  tiZANidine (ZANAFLEX) 4 MG tablet, Take 4 mg by mouth at bedtime. , Disp: , Rfl:  .  topiramate (TOPAMAX) 25 MG tablet, Take 25 mg by mouth 2 (two) times daily. , Disp: , Rfl:  .  ipratropium-albuterol (DUONEB) 0.5-2.5 (3) MG/3ML SOLN, Inhale 3 mLs into the lungs 3 (three) times daily as needed (respiratory). , Disp: , Rfl:   ROS  Constitutional: Denies any fever or chills Gastrointestinal: No reported hemesis, hematochezia, vomiting, or acute GI distress Musculoskeletal: Denies any acute onset joint swelling, redness, loss of ROM, or weakness Neurological: No reported episodes of acute onset apraxia, aphasia, dysarthria, agnosia, amnesia, paralysis, loss of coordination, or loss of consciousness  Allergies  Ms. Lindamood is allergic to amiodarone; aspirin; flexeril [cyclobenzaprine]; trazamine [trazodone & diet manage prod]; codeine; and tramadol.  [place]  Drug: Ms. Phetteplace  reports previous drug use. Alcohol:  reports previous alcohol use of about 3.0 standard drinks of alcohol per week. Tobacco:  reports that she has been smoking cigarettes. She has been smoking about 0.25 packs per day. She has never used smokeless tobacco. Medical:  has a past medical history of Allergy, Anxiety, Arthritis, Asthma, CHF (congestive heart failure) ([place]), COPD (chronic obstructive pulmonary disease) ([place]), Coronary artery disease, Fibromyalgia, GERD (gastroesophageal reflux disease), Hypertension, Pneumonia, PUD (peptic ulcer disease), Pulmonary HTN ([place]), Rheumatic fever/heart disease, and Sleep apnea. Surgical: Ms. Biddle  has a past surgical history that includes Tonsillectomy; Adenoidectomy; Multiple tooth extractions; Total knee arthroplasty (Right, 09/04/2016); Esophagogastroduodenoscopy (egd) with propofol (N/A, 05/25/2018); and Mitral valve replacement. Family: family  history includes Asthma in her mother; COPD in her mother; Cancer in her mother; Congestive Heart Failure in her father.  Constitutional Exam  General appearance: Well nourished, well developed, and well hydrated. In no apparent acute distress Vitals:   01/18/19 0850  BP: 111/70  Pulse: 85  Temp: 97.9 F (36.6 C)  SpO2: 98%  Weight: 273 lb (123.8 kg)  Height: _0  (1.626 m)   BMI Assessment: Estimated body mass index is 46.86 kg/m as calculated from the following:   Height as of this encounter: _1  (1.626 m).   Weight as of this encounter: 273 lb (123.8 kg).  BMI interpretation table: BMI level Category Range association with higher incidence of chronic pain  <18 kg/m2 Underweight   18.5-24.9 kg/m2 Ideal body weight   25-29.9 kg/m2 Overweight Increased incidence by 20%  30-34.9 kg/m2 Obese (Class I) Increased incidence by 68%  35-39.9 kg/m2 Severe obesity (Class II) Increased incidence by 136%  >40 kg/m2 Extreme obesity (Class III) Increased incidence by 254%   Patient's current BMI Ideal Body weight  Body mass index is 46.86 kg/m. Ideal body weight: 54.7 kg (120 lb 9.5 oz) Adjusted ideal body weight: 82.4 kg (181 lb  8.9 oz)   BMI Readings from Last 4 Encounters:  01/18/19 46.86 kg/m  01/16/19 43.58 kg/m  01/05/19 46.35 kg/m  12/27/18 46.35 kg/m   Wt Readings from Last 4 Encounters:  01/18/19 273 lb (123.8 kg)  01/16/19 273 lb 4.8 oz (124 kg)  01/05/19 270 lb (122.5 kg)  12/27/18 270 lb (122.5 kg)  Psych/Mental status: Alert, oriented x 3 (person, place, & time)       Eyes: PERLA Respiratory: No evidence of acute respiratory distress  Cervical Spine Area Exam  Skin & Axial Inspection: No masses, redness, edema, swelling, or associated skin lesions Alignment: Symmetrical Functional ROM: Unrestricted ROM      Stability: No instability detected Muscle Tone/Strength: Functionally intact. No obvious neuro-muscular anomalies detected. Sensory (Neurological):  Unimpaired Palpation: No palpable anomalies              Upper Extremity (UE) Exam    Side: Right upper extremity  Side: Left upper extremity  Skin & Extremity Inspection: Skin color, temperature, and hair growth are WNL. No peripheral edema or cyanosis. No masses, redness, swelling, asymmetry, or associated skin lesions. No contractures.  Skin & Extremity Inspection: Skin color, temperature, and hair growth are WNL. No peripheral edema or cyanosis. No masses, redness, swelling, asymmetry, or associated skin lesions. No contractures.  Functional ROM: Unrestricted ROM          Functional ROM: Unrestricted ROM          Muscle Tone/Strength: Functionally intact. No obvious neuro-muscular anomalies detected.  Muscle Tone/Strength: Functionally intact. No obvious neuro-muscular anomalies detected.  Sensory (Neurological): Unimpaired          Sensory (Neurological): Unimpaired          Palpation: No palpable anomalies              Palpation: No palpable anomalies              Provocative Test(s):  Phalen's test: deferred Tinel's test: deferred Apley's scratch test (touch opposite shoulder):  Action 1 (Across chest): deferred Action 2 (Overhead): deferred Action 3 (LB reach): deferred   Provocative Test(s):  Phalen's test: deferred Tinel's test: deferred Apley's scratch test (touch opposite shoulder):  Action 1 (Across chest): deferred Action 2 (Overhead): deferred Action 3 (LB reach): deferred    Thoracic Spine Area Exam  Skin & Axial Inspection: No masses, redness, or swelling Alignment: Symmetrical Functional ROM: Unrestricted ROM Stability: No instability detected Muscle Tone/Strength: Functionally intact. No obvious neuro-muscular anomalies detected. Sensory (Neurological): Unimpaired Muscle strength & Tone: No palpable anomalies  Lumbar Spine Area Exam  Skin & Axial Inspection: No masses, redness, or swelling Alignment: Symmetrical Functional ROM: Decreased ROM affecting both  sides Stability: No instability detected Muscle Tone/Strength: Guarding detected Sensory (Neurological): Movement-associated pain Palpation: Complains of area being tender to palpation       Provocative Tests: Hyperextension/rotation test: (+) bilaterally for facet joint pain. Lumbar quadrant test (Kemp's test): (+) bilaterally for facet joint pain. Lateral bending test: deferred today       Patrick's Maneuver: (+) for bilateral S-I arthralgia and for left hip arthralgia FABER* test: (+) for bilateral S-I arthralgia and for left hip arthralgia S-I anterior distraction/compression test: deferred today         S-I lateral compression test: deferred today         S-I Thigh-thrust test: deferred today         S-I Gaenslen's test: deferred today         *(Flexion, ABduction  and External Rotation)  Gait & Posture Assessment  Ambulation: Patient ambulates using a cane Gait: Significantly limited. Dependent on assistive device to ambulate Posture: Difficulty standing up straight, due to pain   Lower Extremity Exam    Side: Right lower extremity  Side: Left lower extremity  Stability: No instability observed          Stability: No instability observed          Skin & Extremity Inspection: Skin color, temperature, and hair growth are WNL. No peripheral edema or cyanosis. No masses, redness, swelling, asymmetry, or associated skin lesions. No contractures.  Skin & Extremity Inspection: Skin color, temperature, and hair growth are WNL. No peripheral edema or cyanosis. No masses, redness, swelling, asymmetry, or associated skin lesions. No contractures.  Functional ROM: Decreased ROM for hip and knee joints          Functional ROM: Decreased ROM for hip and knee joints          Muscle Tone/Strength: Deconditioned  Muscle Tone/Strength: Deconditioned  Sensory (Neurological): Unimpaired        Sensory (Neurological): Arthropathic arthralgia of the left hip  DTR: Patellar: deferred today Achilles:  deferred today Plantar: deferred today  DTR: Patellar: deferred today Achilles: deferred today Plantar: deferred today  Palpation: No palpable anomalies  Palpation: No palpable anomalies   Assessment   Status Diagnosis  Persistent Unimproved Unimproved 1. Chronic low back pain (Primary Area of Pain) (Bilateral) (L>R) w/o sciatica   2. Chronic sacroiliac joint pain (Bilateral) (L>R)   3. Chronic hip pain (Left)   4. Chronic lower extremity pain (Secondary Area of Pain) (Left)   5. Sternal pain (Tertiary Area of Pain)   6. Fibromyalgia (Fourth Area of Pain)   7. Chronic knee pain (Fifth Area of Pain) (Right)   8. Chronic gout involving toe of foot, unspecified cause (Left)   9. Osteoarthritis of sacroiliac joints (Bilateral)   10. Other specified dorsopathies, sacral and sacrococcygeal region   11. Chronic anticoagulation (XARELTO)   12. Chronic pain syndrome      Updated Problems: Problem  Chronic gout involving toe of foot, unspecified cause (Left)  Chronic hip pain (Left)  Osteoarthritis of sacroiliac joints (Bilateral)  Other Specified Dorsopathies, Sacral and Sacrococcygeal Region  Chronic sacroiliac joint pain (Bilateral) (L>R)  Acute Drug-Induced Gout of Left Foot   Last Assessment & Plan:  Likely at least partially brought on by diuresis.  Needs to continue to diurese  Will push hydration Stop allopurinol given initiation during acute flare may worsen this, re-broach this when asymptomatic Avoid nsaids given stomach pain Trial colchicine Add acetaminophen Ice, elevate, rest   Arthritis of Right Knee   Last Assessment & Plan:  Pain is not improved. Will refer to anesthesia pain clinic for further management. Also recommended food diary with nutrition referral as weight loss will help decrease the stress on her knee and help her qualify for the surgery.   History of Cocaine Use  Chronic anticoagulation (XARELTO)  S/P Mvr (Mitral Valve Replacement)  Tobacco  Dependence  History of Substance Abuse (Hcc)   (02/25/15) Assessment & Plan:  Currently denies active substance use, stated cocaine use was not regular and was in response to being in pain. Is seeking pain management today. Discussed that she has some high risk features for opiate management of chronic pain and she is safest served with evaluation in multidisciplinary pain management, she requests referral -pain management referral, however given prior breach of contract I am  not sure she would be considered an appropriate candidate.  -encouraged active substance use treatment, she defers.   Melena  Acute Esophagogastric Ulcer  Shortness of Breath  Insomnia  Gad (Generalized Anxiety Disorder)   Last Assessment & Plan:  Started 10 mg buspirone with uptitration weekly until on 30 mg daily  -given #50 for 5 mg diazepam for two week cross taper   Moderate to Severe Pulmonary Hypertension (Hcc)   Detected on Echocardiogram in early 2016. No history of LHC/RHC for confirmation of diagnosis and right-sided pressures.   Last Assessment & Plan:  -refer to pulmonology with spirometry for COPD diagnosis confirmation and tailored treatment   Rheumatic Heart Disease, Unspecified   Suggested on echocardiogram in 01/2015 due to mild annular calcification with evidence of severe mitral regurgitation.  Last Assessment & Plan:  Still slightly volume up with leg swelling -agree with slight increase in bumex, K reviewed and appropriate -f/u in cardiology -f/u with CT surgery to discuss valve repair -continue elequis   Copd (Chronic Obstructive Pulmonary Disease) With Acute Bronchitis (Hcc)   Last Assessment & Plan:  Not using symbicort appropriately -educated to use once daily.   Chronic Diastolic Heart Failure (Hcc)   Last Assessment & Plan:  Told about it and told that she has a "hole in her heart". Takes 20 mg furosemide. Does not have evidence of peripheral edema.  -I am interested in checking  a pro-BNP to evaluate further -She may need a stress test. I would prefer stress echo in this patient. Maybe even a dobutamine stress given her knee pain.   Depression    Plan of Care  Pharmacotherapy (Medications Ordered): Meds ordered this encounter  Medications  . Oxycodone HCl 10 MG TABS    Sig: Take 0.5-1 tablets (5-10 mg total) by mouth 2 (two) times daily as needed for up to 30 days. Must last 30 days. MAX.: 2/day    Dispense:  60 tablet    Refill:  0    [place] STOP ACT - Not applicable to Chronic Pain Syndrome (G89.4) diagnosis. Fill one day early if pharmacy is closed on scheduled refill date. Do not fill until: 02/19/19. To last until: 03/21/19.   Medications administered today: Arneisha C. Alderfer had no medications administered during this visit.   Procedure Orders     SACROILIAC JOINT INJECTION Lab Orders  No laboratory test(s) ordered today    Imaging Orders     DG HIP UNILAT W OR W/O PELVIS 2-3 VIEWS LEFT Referral Orders  No referral(s) requested today   Interventional management options: Planned, scheduled, and/or pending:   NOTE: XARELTOAnticoagulation. (Stop: x 3 days prior to procedures. Re-start: 6 hours after) (NO STEROIDS - CHF)  Diagnostic bilateral sacroiliac joint block #1, (NO STEROIDS) under fluoroscopic guidance and IV sedation.   Considering:   Diagnostic bilateral lumbar facet nerve blocks Diagnostic bilateral lumbar facet radiofrequency ablation Trigger point injections Diagnostic right knee genicular nerve block Possible right knee genicular nerve radiofrequency ablation   Palliative PRN treatment(s):   None at this time   Provider-requested follow-up: Return for Procedure (w/ sedation): (B) SI BLK #1 (NO STEROIDS), (Blood-thinner Protocol).  Future Appointments  Date Time Provider [place]  01/23/2019 11:30 AM [facility] None  01/23/2019  1:00 PM [facility]  01/24/2019  9:45 AM  Milinda Pointer, MD [facility] None  01/25/2019 11:30 AM [facility] None  01/26/2019  4:10 PM [facility]  01/27/2019 11:30 AM [facility] None  01/30/2019 11:30 AM [facility] None  02/01/2019 11:30 AM [facility] None  02/03/2019 11:30 AM [facility] None  02/06/2019 11:30 AM [facility] None  02/08/2019 11:30 AM [facility] None  02/10/2019 11:30 AM [facility] None  02/13/2019 11:30 AM [facility] None  02/15/2019 11:30 AM [facility] None  02/17/2019 11:30 AM [facility] None  02/20/2019 11:30 AM [facility] None  02/22/2019 11:30 AM [facility] None  02/24/2019 11:30 AM [facility] None  02/27/2019 11:30 AM [facility] None  03/01/2019 11:30 AM [facility] None  03/03/2019 11:30 AM [facility] None  03/06/2019 11:30 AM [facility] None  03/08/2019 11:30 AM [facility] None  03/10/2019 11:30 AM [facility] None  03/13/2019 11:30 AM [facility] None  03/15/2019 11:30 AM [facility] None  03/17/2019 11:30 AM [facility] None  03/20/2019 11:30 AM [facility] None  03/22/2019 11:30 AM [facility] None  03/24/2019 11:30 AM [facility] None  03/27/2019 11:30 AM [facility] None  03/29/2019 11:30 AM [facility] None  03/31/2019 11:30 AM [facility] None   04/03/2019 11:30 AM [facility] None  04/05/2019 11:30 AM [facility] None  04/07/2019 11:30 AM [facility] None  04/10/2019 11:30 AM [facility] None  04/12/2019 11:30 AM [facility] None  04/14/2019 11:30 AM [facility] None   Primary Care Physician: Kathee Delton, MD Location: [name] Note by: Gaspar Cola, MD Date: 01/18/2019; Time: 10:26 AM

## 2019-01-18 NOTE — Progress Notes (Signed)
Nursing Pain Medication Assessment:  Safety precautions to be maintained throughout the outpatient stay will include: orient to surroundings, keep bed in low position, maintain call bell within reach at all times, provide assistance with transfer out of bed and ambulation.  Medication Inspection Compliance: Pill count conducted under aseptic conditions, in front of the patient. Neither the pills nor the bottle was removed from the patient's sight at any time. Once count was completed pills were immediately returned to the patient in their original bottle.  Medication: Oxycodone IR Pill/Patch Count: 3 of 60 pills remain Pill/Patch Appearance: Markings consistent with prescribed medication Bottle Appearance: Standard pharmacy container. Clearly labeled. Filled Date: 1 / 22 / 2020 Last Medication intake:  Today

## 2019-01-18 NOTE — Patient Instructions (Addendum)
____________________________________________________________________________________________  Gout    Mechanism: Uric acid accumulation.    Uric Acid: Uric acid is a heterocyclic compound of carbon, nitrogen, oxygen, and hydrogen with the formula [id number]. It forms ions and salts known as urates and acid urates such as ammonium acid urate. Uric acid is a product of the metabolic breakdown of purine nucleotides. High blood concentrations of uric acid can lead to gout. The chemical is associated with other medical conditions including diabetes and the formation of ammonium acid urate kidney stones.    Purines: Purines are found in high concentration in meat and meat products, especially internal organs such as liver and kidney. In general, plant-based diets are low in purines. Examples of high-purine sources include: sweetbreads, anchovies, sardines, liver, beef kidneys, brains, meat extracts (e.g., Oxo, Bovril), herring, mackerel, scallops, game meats, beer (from the yeast) and gravy. A moderate amount of purine is also contained in beef, pork, poultry, other fish and seafood, asparagus, cauliflower, spinach, mushrooms, green peas, lentils, dried peas, beans, oatmeal, wheat bran, wheat germ, and hawthorn. Higher levels of meat and seafood consumption are associated with an increased risk of gout, whereas a higher level of consumption of dairy products is associated with a decreased risk. Moderate intake of purine-rich vegetables or protein is not associated with an increased risk of gout.    Causes: Uric acid is generated as the body's tissues are broken down during normal cell turnover. Some people with gout generate too much uric acid (10%). Other patients with gout do not effectively eliminate their uric acid into the urine (90%). Genetics, gender,and nutrition (alcoholism, obesity) play key roles in the development of gout.   1. If your parents have gout, then you have a 20% chance of developing it.    2. [place] people are 5 times more likely to develop gout. 3. American blacks, but not African blacks, are more likely to have gout than other populations. 4. Use of alcohol, especially beer, increases the risk for gout.   5. Diets rich in red meats, internal organs, yeast, and oily fish increase the risk for gout.   6. Uric acid levels increase at puberty in men and at menopause in women, so men first develop gout at an earlier age (30s to 50s) than do women (50s to 70s). Gout in pre-menopausal women is distinctly unusual.   7. Attacks of gouty arthritis?can be precipitated when there is a sudden change in uric acid levels.   8. Overindulgence of alcohol and red meats   9. Trauma   10. Starvation and dehydration 11. IV contrast dyes   12. Chemotherapy     Some Possible Causes of Elevated Uric Acid Levels  Medication Diuretics used for weight loss or heart disease, insulin, some antibiotics, medication for rheumatoid arthritis, or an overdose of B vitamins can cause uric acid levels to rise. Diuretics reduce sodium, magnesium, calcium and potassium (among other things) levels. If you need to use a diuretic, see our natural herbal products for ones with fewer side effects. One customer reported getting gout when he took beta-blockers for his high blood pressure.     Poor kidney function When kidneys are not functioning at optimum levels, they lose their ability to excrete uric acid from the body. This situation may be due to various kidney problems or over-consumption of alcohol. When alcohol is metabolized, lactic acid is produced, which hinders uric acid excretion by the kidneys.     Dieting Severe dieting or fasting can cause excess lactic   acid, which hinders uric acid excretion by the kidneys. Crash and severe calorie restriction diets shock your metabolism and can trigger a gout attack. Dieting may also cause a loss of potassium, which can increase urate levels in the blood. As mentioned  above, some dieters also use diuretics to speed the process, and they can rob the body of potassium and other minerals, triggering a gout attack. It seems to be a vicious circle! However, a proper diet that is done slowly is recommended because losing weight will reduce serum levels of uric acid.    Diet Traditional thinking tells [place] that gout is the result of excessive amounts of alcohol, protein, heavy foods, coffee and soft drinks in your diet. Certain foods contain high levels of purine which can cause uric acid levels to rise. Purine is a protein substance that is transformed into uric acid during digestion. Reduction in consumption of these foods is very often successful in reducing or eliminating gout.     A potassium deficiency can increase urate levels in the blood. This is very important, and ways to correct it?are discussed above and under the diuretics section.    Drugs that increase serum uric acid  1. Aspirin (Low dose)  2. Diuretics  3. hypertensive medications   4. Nicotinic acid   5. Cyclosporine A   6. Acetaminophen (Tylenol)  7. Others     Pharmacological treatment:  1) Colchicine (PO)  Adverse Side Effects: nausea, vomiting, diarrhea  MAX: 6 mg/day during an acute attack  Goal: keep serum urate < 7.0 mg/dl Prophylactic Dose: 0.6 -1.2 mg/day  2) Probenecid (Uricosuric properties)  Mechanism of Action: increases uric acid excretion  Adverse Side Effects: overt nephrolithiasis (Kidney stones). Side effects of probenecid are uncommon and usually mild. In addition to causing kidney stones and precipitating acute gouty arthritis, side effects of probenecid include hair loss, skin rash, headache, nausea, sore gums, and fever. In rare instances, it has caused severe anemias  To Avoid Stones:  start at low doses  Stay well hydrated  Alkalinize urine  1) Sodium Bicarbonate  2) And/or Acetazolamide (Carbonic anhydrate inhibitor) Starting Dose: 250 mg bid and increase over  several weeks  3) Allopurinol  Mechanism of Action: Decreases uric acid production  Adverse Side Effects: fever, dermatitis, elevated liver enzymes, diarrhea, and vasculitis. The most frequent adverse reaction to allopurinol is skin rash. Allopurinol should be discontinued immediately at the first appearance of rash, painful urination, blood in the urine, eye irritation, or swelling of the mouth or lips, because these can be a signs of impending severe allergic reaction, which can be fatal. Rarely, allopurinol can cause nerve, kidney, and bone marrow damage.  Dose: 300 mg/day   Treatment  1. Drink 2 to 3 L of fluid daily.  2. Consume a moderate amount of protein. Limit meat, fish and poultry to 4 - 6 oz per day. Try other low-purine good protein foods such as low fat dairy products, tofu and eggs.  3. Limit fat intake by choosing leaner meats, foods prepared with less oils and lower fat dairy products  4. Aside from avoiding high purine foods, maintaining a healthy body weight is important for gout patients as well. Obesity can result in increased uric acid production by the body. Follow a well-balanced diet to lose excess body weight. Do not follow a high-protein low-carb diet as this can worsen gout conditions.  5. Keep the urine pH high (basic or non-acidic)  6. Colchicine, probenecid, allopurinol, sodium bicarbonate.  Prevention  If you are at risk for gout, you should   1. Eat a low-cholesterol, low-fat diet. People with gout have a higher risk for heart disease. This diet would not only lower your risk for gout but also your risk for heart disease.   2. Slowly lose weight. This can lower your uric acid levels. Losing weight too rapidly can occasionally precipitate gout attacks.   3. Restrict your?intake of alcohol, especially beer.   If you have had an attack of gouty arthritis, you should do all of the above and follow the regimen prescribed by your physician. The adequate prevention of  gouty arthritis may involve lifelong medical therapy.    Balanced Diet  According to the American Medical Association, a balanced diet for people with gout include foods:  1. High in complex carbohydrates (whole grains, fruits, vegetables)   2. Low in protein (15% of calories and sources should be soy, lean meats, poultry)   3. No more than 30% of calories from fat (10% animal fat)    Beneficial Foods  Foods which may be beneficial to people with gout include:  1. Dark berries may contain chemicals that lower uric acid and reduce inflammation.   2. Tofu which is made from soybeans may be a better choice than meats.   3. Certain fatty acids found in certain fish such as salmon, flax or olive oil, or nuts may possess some anti-inflammatory benefits.    Recommended Foods to Eat  1. Fresh cherries, strawberries, blueberries, and other red-blue berries   2. Bananas   3. Celery   4. Tomatoes   5. Vegetables including kale, cabbage, parsley, green-leafy vegetables   6. Foods high in bromelain (pineapple)   7. Foods high in vitamin C (red cabbage, red bell peppers, tangerines, mandarins, oranges, potatoes)   8. Drink fruit juices and purified water (8 glasses of water per day)   9. Low-fat dairy products   10. Complex carbohydrates (breads, cereals, pasta, rice, as well as aforementioned vegetables and fruits)   11. Chocolate, cocoa   12. Coffee, tea   13. Carbonated beverages   14. Essential fatty acids (tuna and salmon, flaxseed, nuts, seeds)   15. Tofu, although a legume and made from soybeans, may be a better choice than meat    Foods to Avoid  Diets which are high in purines and high in protein have long been suspected of causing an increased risk of gout .    According to the American Medical Association, purine-containing foods include:  1. Beer, other alcoholic beverages. Limit alcohol consumption to 1 drink 3 times a week.  2. Anchovies, sardines in oil, fish roes, herring,  Mackerel, Scallops, mussels  3. Yeast. (Beer), whole grain breads and cereals, oatmeal  4. Organ meat (liver, kidneys, brains, sweetbreads)   5. Processed meats (hot dogs, lunch meats, etc.),  6. Legumes (dried beans, peas, lima beans)   7. Meat extracts, consomm, broth, bouillon, gravies. (e.g Oxo, Bovril)  8. Mushrooms, spinach, asparagus, cauliflower, mushrooms.  9. Chicken, duck, ham, [place], Game meats   10. Fried foods, roasted nuts, any food cooked in oil (heated oil destroys vitamin E)  11. Rich foods (cakes, sugar products, white flour products)  12. Dried fruits  13. Caffeine  14. Eggs    NOTE: It is important to remember that purines are found in all protein foods. All sources of purines should not be eliminated.    Urine at pH 7.0 is neutral and elimination of  uric acid decreases by approximately 50% at pH 6.5. The pka of uric acid is 5.75.  In urine at pH 5.0, only 15% of uric acid exists in solution. The solubility increases more than 10-fold at pH 7.0 and more than 100-fold at pH 8.0.    Extremely Alkaline Forming Foods - pH 8.5 to 9.0:  Lemons, Watermelon , Agar Agar , Cantaloupe, Cayenne (Capsicum), Dried dates & figs, Kelp, [place], [place] root, Limes, [place], Melons, Papaya, [place], [place] grapes (sweet), Watercress, Seaweed    Moderate Alkaline - pH 7.5 to 8.0  Apples (sweet), Apricots, Alfalfa sprouts Arrowroot, Avocados, Bananas (ripe), Berries, Carrots, Celery, Currants, Dates & figs (fresh), Garlic , Gooseberry, Grapes (less sweet), Grapefruit, Guavas, Herbs (leafy green), Lettuce (leafy green), Nectarine, Peaches (sweet), Pears (less sweet), Peas (fresh sweet), Persimmon, Pumpkin (sweet), Sea salt , Spinach, Apples (sour), Bamboo shoots, Beans (fresh green), Beets, Bell Pepper, Broccoli, Cabbage, Cauliflower, Carob , Daikon, Ginger (fresh), Grapes (sour), Kale, Kohlrabi, Lettuce (pale green), Oranges, Parsnip, Peaches (less sweet), Peas (less sweet), Potatoes & skin,  Pumpkin (less sweet), Raspberry, Sapote, Strawberry, Squash , Sweet corn (fresh), Tamari , Turnip, Sour Dairy    Slightly Alkaline to Neutral pH 7.0  Almonds , Artichokes ([place]), Barley-Malt (sweetener-[place]), [employer] Rice Syrup, [employer], Cherries, Coconut (fresh), Cucumbers, [employer] plant, Honey (raw), Leeks, Miso, Okra, Olives ripe , [place], [place] (home made with brown rice vinegar), Radish, Sea salt , Spices , Taro, Tomatoes (sweet), Vinegar (sweet brown rice), Water Chestnut, Amaranth, Artichoke (globe), Chestnuts (dry roasted), Egg yolks (soft cooked), Goat's milk and whey (raw) , Horseradish, Mayonnaise (home made), Millet, Olive oil, Quinoa, Rhubarb, Sesame seeds (whole) , Soy beans (dry), Sprouted grains , Tempeh, Tofu, Tomatoes (less sweet)    Slightly Acid to Neutral pH 7.0  Barley malt syrup, Barley, Bran, Cashews, Cereals (unrefined with honey-fruit-maple syrup), Cornmeal, Fructose, Honey (pasteurized), Lentils, Macadamias, Maple syrup (unprocessed),Low Fat Milk (homogenized) and most processed dairy products, Molasses organic , Nutmeg, Mustard, Pistachios, Popcorn (plain), Rice or wheat crackers (unrefined), Rye (grain), Rye bread (organic sprouted), Seeds (pumpkin & sunflower), Walnuts Blueberries, [place] nuts, Butter (salted), Cheeses (mild & crumbly) , Crackers (unrefined rye), Dried beans (mung, adzuki, pinto, kidney, garbanzo) , Dry coconut, Egg whites, Goats milk (homogenized), Olives (pickled), Pecans, Plums , Prunes , Butter (fresh unsalted), Cream (fresh & raw), Milk (raw cow's) , Whey (cow's)     ACID FORMING FOODS FATS & OILS  Avocado Oil, Canola Oil, Corn Oil, Hemp Seed Oil, Flax Oil, Lard, Olive Oil, Safflower Oil, Sesame Oil, Sunflower Oil    FRUITS  Cranberries    GRAINS  Rice Cakes, Wheat Cakes, Amaranth, Barley, Buckwheat, Oats (rolled), Quinoa, Rice, Rye, Spelt, [place], Wheat, Hemp Seed Flour    NUTS & BUTTERS  Cashews, [place] Nuts, Peanuts, Processed Peanut  Butter, Pecans, Tahini    ANIMAL PROTEIN  Beef, Carp, Clams, Fish, [place], [place], Mussels, [place], [employer], Rabbit, [place], Shrimp, [place], [place], [place], Therapist, nutritional    PASTA (WHITE)  Noodles, Macaroni, Spaghetti Distilled Vinegar, Wheat Germ    BEANS & LEGUMES  Black Beans, Chick Peas, Green Peas, Kidney Beans, Lentils, [place] Beans, Pinto Beans, Red Beans, Soy Beans, Soy Milk, White Beans, Rice Milk, Almond Milk    DRUGS & CHEMICALS  Aspartame, Chemicals, Drugs (Medicinal), Drugs (Psychedelic), Pesticides, Herbicides    ALCOHOL  Beer, Spirits, Hard Liquor, Wine    ACTIVITIES  Overwork, Anger, Fear, Jealousy, Stress    Moderate Acid - pH 6.0 to 6.5  Cigarette tobacco, Cream of Wheat (  unrefined), Fish, Fruit juices with sugar, Maple syrup (processed), Molasses (sulphured), Pickles (commercial), Breads (refined) of corn, oats, rice & rye, Cereals (refined), corn flakes, Shellfish, Wheat germ, Whole Wheat foods , Wine , Yogurt (sweetened) Bananas (green), Buckwheat, Cheeses (sharp), Corn & rice breads, Egg whole (cooked hard), Ketchup, Mayonnaise, Oats, Pasta (whole grain), Pastry (wholegrain & honey), Peanuts, Potatoes (with no skins), Popcorn (with salt & butter), Rice (basmati), Rice (brown), Soy sauce (commercial), Tapioca, Wheat bread (sprouted organic)    Extremely Acid Forming Foods - pH 5.0 to 5.5  Artificial sweeteners, Beef, Carbonated soft drinks & fizzy drinks , Cigarettes (tailor made), Drugs, Flour (white wheat), Goat, Lamb, Pastries & cakes from white flour, Pork, Sugar (white) , Beer , Brown sugar , Chicken, [place], Chocolate, Coffee , Custard with white sugar, Jams, Jellies, Liquor , Pasta (white), Rabbit, [place], Table salt refined & iodized, Tea black, [place], Wheat bread, White rice, White vinegar (processed).    Research Update:   A recent study published in the [place] [employer] on Feb 08, 2003 revealed that high intake of low-fat dairy products indeed reduces  the risk of gout by 50%. It is unknown why low-fat dairy products offer a protective effect.   Unfortunately, no natural supplements are proven effective to prevent or alleviate onset of acute gout attacks. The most effective treatment for gout attack is medication.   ____________________________________________________________________________________________   ______________________________________________________________________________________________  Specialty Pain Scale  Introduction:  There are significant differences in how pain is reported. The word pain usually refers to physical pain, but it is also a common synonym of suffering. The medical community uses a scale from 0 (zero) to 10 (ten) to report pain level. Zero (0) is described as "no pain", while ten (10) is described as "the worse pain you can imagine". The problem with this scale is that physical pain is reported along with suffering. Suffering refers to mental pain, or more often yet it refers to any unpleasant feeling, emotion or aversion associated with the perception of harm or threat of harm. It is the psychological component of pain.  Pain Specialists prefer to separate the two components. The pain scale used by this practice is the Verbal Numerical Rating Scale (VNRS-11). This scale is for the physical pain only. DO NOT INCLUDE how your pain psychologically affects you. This scale is for adults 70 years of age and older. It has 11 (eleven) levels. The 1st level is 0/10. This means: "right now, I have no pain". In the context of pain management, it also means: "right now, my physical pain is under control with the current therapy".  General Information:  The scale should reflect your current level of pain. Unless you are specifically asked for the level of your worst pain, or your average pain. If you are asked for one of these two, then it should be understood that it is over the past 24 hours.  Levels 1 (one) through 5  (five) are described below, and can be treated as an outpatient. Ambulatory pain management facilities such as ours are more than adequate to treat these levels. Levels 6 (six) through 10 (ten) are also described below, however, these must be treated as a hospitalized patient. While levels 6 (six) and 7 (seven) may be evaluated at an urgent care facility, levels 8 (eight) through 10 (ten) constitute medical emergencies and as such, they belong in a hospital's emergency department. When having these levels (as described below), do not come to our office.  Our facility is not equipped to manage these levels. Go directly to an urgent care facility or an emergency department to be evaluated.  Definitions:  Activities of Daily Living (ADL): Activities of daily living (ADL or ADLs) is a term used in healthcare to refer to people's daily self-care activities. Health professionals often use a person's ability or inability to perform ADLs as a measurement of their functional status, particularly in regard to people post injury, with disabilities and the elderly. There are two ADL levels: Basic and Instrumental. Basic Activities of Daily Living (BADL  or BADLs) consist of self-care tasks that include: Bathing and showering; personal hygiene and grooming (including brushing/combing/styling hair); dressing; Toilet hygiene (getting to the toilet, cleaning oneself, and getting back up); eating and self-feeding (not including cooking or chewing and swallowing); functional mobility, often referred to as "transferring", as measured by the ability to walk, get in and out of bed, and get into and out of a chair; the broader definition (moving from one place to another while performing activities) is useful for people with different physical abilities who are still able to get around independently. Basic ADLs include the things many people do when they get up in the morning and get ready to go out of the house: get out of bed, go  to the toilet, bathe, dress, groom, and eat. On the average, loss of function typically follows a particular order. Hygiene is the first to go, followed by loss of toilet use and locomotion. The last to go is the ability to eat. When there is only one remaining area in which the person is independent, there is a 62.9% chance that it is eating and only a 3.5% chance that it is hygiene. Instrumental Activities of Daily Living (IADL or IADLs) are not necessary for fundamental functioning, but they let an individual live independently in a community. IADL consist of tasks that include: cleaning and maintaining the house; home establishment and maintenance; care of others (including selecting and supervising caregivers); care of pets; child rearing; managing money; managing financials (investments, etc.); meal preparation and cleanup; shopping for groceries and necessities; moving within the community; safety procedures and emergency responses; health management and maintenance (taking prescribed medications); and using the telephone or other form of communication.  Instructions:  Most patients tend to report their pain as a combination of two factors, their physical pain and their psychosocial pain. This last one is also known as "suffering" and it is reflection of how physical pain affects you socially and psychologically. From now on, report them separately.  From this point on, when asked to report your pain level, report only your physical pain. Use the following table for reference.  Pain Clinic Pain Levels (0-5/10)  Pain Level Score  Description  No Pain 0   Mild pain 1 Nagging, annoying, but does not interfere with basic activities of daily living (ADL). Patients are able to eat, bathe, get dressed, toileting (being able to get on and off the toilet and perform personal hygiene functions), transfer (move in and out of bed or a chair without assistance), and maintain continence (able to control bladder  and bowel functions). Blood pressure and heart rate are unaffected. A normal heart rate for a healthy adult ranges from 60 to 100 bpm (beats per minute).   Mild to moderate pain 2 Noticeable and distracting. Impossible to hide from other people. More frequent flare-ups. Still possible to adapt and function close to normal. It can be very annoying and  may have occasional stronger flare-ups. With discipline, patients may get used to it and adapt.   Moderate pain 3 Interferes significantly with activities of daily living (ADL). It becomes difficult to feed, bathe, get dressed, get on and off the toilet or to perform personal hygiene functions. Difficult to get in and out of bed or a chair without assistance. Very distracting. With effort, it can be ignored when deeply involved in activities.   Moderately severe pain 4 Impossible to ignore for more than a few minutes. With effort, patients may still be able to manage work or participate in some social activities. Very difficult to concentrate. Signs of autonomic nervous system discharge are evident: dilated pupils (mydriasis); mild sweating (diaphoresis); sleep interference. Heart rate becomes elevated (>115 bpm). Diastolic blood pressure (lower number) rises above 100 mmHg. Patients find relief in laying down and not moving.   Severe pain 5 Intense and extremely unpleasant. Associated with frowning face and frequent crying. Pain overwhelms the senses.  Ability to do any activity or maintain social relationships becomes significantly limited. Conversation becomes difficult. Pacing back and forth is common, as getting into a comfortable position is nearly impossible. Pain wakes you up from deep sleep. Physical signs will be obvious: pupillary dilation; increased sweating; goosebumps; brisk reflexes; cold, clammy hands and feet; nausea, vomiting or dry heaves; loss of appetite; significant sleep disturbance with inability to fall asleep or to remain asleep. When  persistent, significant weight loss is observed due to the complete loss of appetite and sleep deprivation.  Blood pressure and heart rate becomes significantly elevated. Caution: If elevated blood pressure triggers a pounding headache associated with blurred vision, then the patient should immediately seek attention at an urgent or emergency care unit, as these may be signs of an impending stroke.    Emergency Department Pain Levels (6-10/10)  Emergency Room Pain 6 Severely limiting. Requires emergency care and should not be seen or managed at an outpatient pain management facility. Communication becomes difficult and requires great effort. Assistance to reach the emergency department may be required. Facial flushing and profuse sweating along with potentially dangerous increases in heart rate and blood pressure will be evident.   Distressing pain 7 Self-care is very difficult. Assistance is required to transport, or use restroom. Assistance to reach the emergency department will be required. Tasks requiring coordination, such as bathing and getting dressed become very difficult.   Disabling pain 8 Self-care is no longer possible. At this level, pain is disabling. The individual is unable to do even the most "basic" activities such as walking, eating, bathing, dressing, transferring to a bed, or toileting. Fine motor skills are lost. It is difficult to think clearly.   Incapacitating pain 9 Pain becomes incapacitating. Thought processing is no longer possible. Difficult to remember your own name. Control of movement and coordination are lost.   The worst pain imaginable 10 At this level, most patients pass out from pain. When this level is reached, collapse of the autonomic nervous system occurs, leading to a sudden drop in blood pressure and heart rate. This in turn results in a temporary and dramatic drop in blood flow to the brain, leading to a loss of consciousness. Fainting is one of the body's  self defense mechanisms. Passing out puts the brain in a calmed state and causes it to shut down for a while, in order to begin the healing process.    Summary: 1. Refer to this scale when providing [place] with your pain level.  2. Be accurate and careful when reporting your pain level. This will help with your care. 3. Over-reporting your pain level will lead to loss of credibility. 4. Even a level of 1/10 means that there is pain and will be treated at our facility. 5. High, inaccurate reporting will be documented as "Symptom Exaggeration", leading to loss of credibility and suspicions of possible secondary gains such as obtaining more narcotics, or wanting to appear disabled, for fraudulent reasons. 6. Only pain levels of 5 or below will be seen at our facility. 7. Pain levels of 6 and above will be sent to the Emergency Department and the appointment cancelled. ______________________________________________________________________________________________   ____________________________________________________________________________________________  Preparing for Procedure with Sedation  Instructions: . Oral Intake: Do not eat or drink anything for at least 8 hours prior to your procedure. . Transportation: Public transportation is not allowed. Bring an adult driver. The driver must be physically present in our waiting room before any procedure can be started. Marland Kitchen Physical Assistance: Bring an adult physically capable of assisting you, in the event you need help. This adult should keep you company at home for at least 6 hours after the procedure. . Blood Pressure Medicine: Take your blood pressure medicine with a sip of water the morning of the procedure. . Blood thinners: Notify our staff if you are taking any blood thinners. Depending on which one you take, there will be specific instructions on how and when to stop it. . Diabetics on insulin: Notify the staff so that you can be scheduled 1st  case in the morning. If your diabetes requires high dose insulin, take only  of your normal insulin dose the morning of the procedure and notify the staff that you have done so. . Preventing infections: Shower with an antibacterial soap the morning of your procedure. . Build-up your immune system: Take 1000 mg of Vitamin C with every meal (3 times a day) the day prior to your procedure. Marland Kitchen Antibiotics: Inform the staff if you have a condition or reason that requires you to take antibiotics before dental procedures. . Pregnancy: If you are pregnant, call and cancel the procedure. . Sickness: If you have a cold, fever, or any active infections, call and cancel the procedure. . Arrival: You must be in the facility at least 30 minutes prior to your scheduled procedure. . Children: Do not bring children with you. . Dress appropriately: Bring dark clothing that you would not mind if they get stained. . Valuables: Do not bring any jewelry or valuables.  Procedure appointments are reserved for interventional treatments only. Marland Kitchen No Prescription Refills. . No medication changes will be discussed during procedure appointments. . No disability issues will be discussed.  Reasons to call and reschedule or cancel your procedure: (Following these recommendations will minimize the risk of a serious complication.) . Surgeries: Avoid having procedures within 2 weeks of any surgery. (Avoid for 2 weeks before or after any surgery). . Flu Shots: Avoid having procedures within 2 weeks of a flu shots or . (Avoid for 2 weeks before or after immunizations). . Barium: Avoid having a procedure within 7-10 days after having had a radiological study involving the use of radiological contrast. (Myelograms, Barium swallow or enema study). . Heart attacks: Avoid any elective procedures or surgeries for the initial 6 months after a "Myocardial Infarction" (Heart Attack). . Blood thinners: It is imperative that you stop these  medications before procedures. Let us know if you if you take any blood thinner.  Marland Kitchen  Infection: Avoid procedures during or within two weeks of an infection (including chest colds or gastrointestinal problems). Symptoms associated with infections include: Localized redness, fever, chills, night sweats or profuse sweating, burning sensation when voiding, cough, congestion, stuffiness, runny nose, sore throat, diarrhea, nausea, vomiting, cold or Flu symptoms, recent or current infections. It is specially important if the infection is over the area that we intend to treat. Marland Kitchen Heart and lung problems: Symptoms that may suggest an active cardiopulmonary problem include: cough, chest pain, breathing difficulties or shortness of breath, dizziness, ankle swelling, uncontrolled high or unusually low blood pressure, and/or palpitations. If you are experiencing any of these symptoms, cancel your procedure and contact your primary care physician for an evaluation.  Remember:  Regular Business hours are:  Monday to Thursday 8:00 AM to 4:00 PM  Provider's Schedule: Milinda Pointer, MD:  Procedure days: Tuesday and Thursday 7:30 AM to 4:00 PM  Gillis Santa, MD:  Procedure days: Monday and Wednesday 7:30 AM to 4:00 PM ____________________________________________________________________________________________   ____________________________________________________________________________________________  Preparing for Procedure with Sedation  Instructions: . Oral Intake: Do not eat or drink anything for at least 8 hours prior to your procedure. . Transportation: Public transportation is not allowed. Bring an adult driver. The driver must be physically present in our waiting room before any procedure can be started. Marland Kitchen Physical Assistance: Bring an adult physically capable of assisting you, in the event you need help. This adult should keep you company at home for at least 6 hours after the procedure. . Blood  Pressure Medicine: Take your blood pressure medicine with a sip of water the morning of the procedure. . Blood thinners: Notify our staff if you are taking any blood thinners. Depending on which one you take, there will be specific instructions on how and when to stop it. . Diabetics on insulin: Notify the staff so that you can be scheduled 1st case in the morning. If your diabetes requires high dose insulin, take only  of your normal insulin dose the morning of the procedure and notify the staff that you have done so. . Preventing infections: Shower with an antibacterial soap the morning of your procedure. . Build-up your immune system: Take 1000 mg of Vitamin C with every meal (3 times a day) the day prior to your procedure. Marland Kitchen Antibiotics: Inform the staff if you have a condition or reason that requires you to take antibiotics before dental procedures. . Pregnancy: If you are pregnant, call and cancel the procedure. . Sickness: If you have a cold, fever, or any active infections, call and cancel the procedure. . Arrival: You must be in the facility at least 30 minutes prior to your scheduled procedure. . Children: Do not bring children with you. . Dress appropriately: Bring dark clothing that you would not mind if they get stained. . Valuables: Do not bring any jewelry or valuables.  Procedure appointments are reserved for interventional treatments only. Marland Kitchen No Prescription Refills. . No medication changes will be discussed during procedure appointments. . No disability issues will be discussed.  Reasons to call and reschedule or cancel your procedure: (Following these recommendations will minimize the risk of a serious complication.) . Surgeries: Avoid having procedures within 2 weeks of any surgery. (Avoid for 2 weeks before or after any surgery). . Flu Shots: Avoid having procedures within 2 weeks of a flu shots or . (Avoid for 2 weeks before or after immunizations). . Barium: Avoid having a  procedure within 7-10 days after  having had a radiological study involving the use of radiological contrast. (Myelograms, Barium swallow or enema study). . Heart attacks: Avoid any elective procedures or surgeries for the initial 6 months after a "Myocardial Infarction" (Heart Attack). . Blood thinners: It is imperative that you stop these medications before procedures. Let us know if you if you take any blood thinner.  . Infection: Avoid procedures during or within two weeks of an infection (including chest colds or gastrointestinal problems). Symptoms associated with infections include: Localized redness, fever, chills, night sweats or profuse sweating, burning sensation when voiding, cough, congestion, stuffiness, runny nose, sore throat, diarrhea, nausea, vomiting, cold or Flu symptoms, recent or current infections. It is specially important if the infection is over the area that we intend to treat. Marland Kitchen Heart and lung problems: Symptoms that may suggest an active cardiopulmonary problem include: cough, chest pain, breathing difficulties or shortness of breath, dizziness, ankle swelling, uncontrolled high or unusually low blood pressure, and/or palpitations. If you are experiencing any of these symptoms, cancel your procedure and contact your primary care physician for an evaluation.  Remember:  Regular Business hours are:  Monday to Thursday 8:00 AM to 4:00 PM  Provider's Schedule: Milinda Pointer, MD:  Procedure days: Tuesday and Thursday 7:30 AM to 4:00 PM  Gillis Santa, MD:  Procedure days: Monday and Wednesday 7:30 AM to 4:00 PM ____________________________________________________________________________________________

## 2019-01-23 ENCOUNTER — Ambulatory Visit
Admission: RE | Admit: 2019-01-23 | Discharge: 2019-01-23 | Disposition: A | Discharge status: Home / Self Care | Payer: Medicaid Other | Source: Ambulatory Visit | Attending: Neurology | Admitting: Neurology

## 2019-01-23 DIAGNOSIS — M545 Low back pain, unspecified: Secondary | ICD-10-CM

## 2019-01-23 DIAGNOSIS — I5032 Chronic diastolic (congestive) heart failure: Secondary | ICD-10-CM

## 2019-01-23 DIAGNOSIS — R269 Unspecified abnormalities of gait and mobility: Secondary | ICD-10-CM | POA: Diagnosis not present

## 2019-01-23 LAB — GLUCOSE, CAPILLARY
Glucose-Capillary: 82 mg/dL (ref 70–99)
Glucose-Capillary: 133 mg/dL — ABNORMAL HIGH (ref 70–99)

## 2019-01-23 NOTE — Progress Notes (Signed)
Daily Session Note  Patient Details  Name: Chelsea Ramsey MRN: [id number] Date of Birth: 06/14/1966 Referring Provider:     Pulmonary Rehab from 01/16/2019 in [facility]  Referring Provider  Isaias Cowman MD      Encounter Date: 01/23/2019  Check In:      Social History   Tobacco Use  Smoking Status Current Every Day Smoker  . Packs/day: 0.25  . Types: Cigarettes  Smokeless Tobacco Never Used  Tobacco Comment   01/16/19 still smoking 1-2 per day.  given fake cigarette to help    Goals Met:  Proper associated with RPD/PD & O2 Sat Exercise tolerated well Personal goals reviewed  Goals Unmet:  Not Applicable  Comments: First full day of exercise!  Patient was oriented to gym and equipment including functions, settings, policies, and procedures.  Patient's individual exercise prescription and treatment plan were reviewed.  All starting workloads were established based on the results of the 6 minute walk test done at initial orientation visit.  The plan for exercise progression was also introduced and progression will be customized based on patient's performance and goals.    [name] is Medical Director for [facility].

## 2019-01-24 ENCOUNTER — Ambulatory Visit: Payer: Medicaid Other | Admitting: Pain Medicine

## 2019-01-24 ENCOUNTER — Ambulatory Visit
Admission: RE | Admit: 2019-01-24 | Discharge: 2019-01-24 | Disposition: A | Discharge status: Home / Self Care | Payer: Medicaid Other | Source: Ambulatory Visit | Attending: Pain Medicine | Admitting: Pain Medicine

## 2019-01-24 ENCOUNTER — Other Ambulatory Visit: Payer: Self-pay

## 2019-01-24 ENCOUNTER — Encounter: Payer: Self-pay | Admitting: Pain Medicine

## 2019-01-24 VITALS — BP 108/79 | HR 87 | Temp 97.7°F | Resp 16 | Ht 64.8 in | Wt 271.0 lb

## 2019-01-24 DIAGNOSIS — M533 Sacrococcygeal disorders, not elsewhere classified: Secondary | ICD-10-CM | POA: Insufficient documentation

## 2019-01-24 DIAGNOSIS — G8929 Other chronic pain: Secondary | ICD-10-CM | POA: Insufficient documentation

## 2019-01-24 DIAGNOSIS — M47818 Spondylosis without myelopathy or radiculopathy, sacral and sacrococcygeal region: Secondary | ICD-10-CM | POA: Diagnosis present

## 2019-01-24 DIAGNOSIS — M545 Low back pain, unspecified: Secondary | ICD-10-CM

## 2019-01-24 DIAGNOSIS — M5388 Other specified dorsopathies, sacral and sacrococcygeal region: Secondary | ICD-10-CM | POA: Insufficient documentation

## 2019-01-24 DIAGNOSIS — M461 Sacroiliitis, not elsewhere classified: Secondary | ICD-10-CM

## 2019-01-24 MED ORDER — ROPIVACAINE HCL 2 MG/ML IJ SOLN
9.0000 mL | Freq: Once | INTRAMUSCULAR | Status: AC
  Administered 2019-01-24: 9 mL via INTRA_ARTICULAR
  Filled 2019-01-24: qty 10

## 2019-01-24 MED ORDER — LIDOCAINE HCL 2 % IJ SOLN
20.0000 mL | Freq: Once | INTRAMUSCULAR | Status: AC
  Administered 2019-01-24: 400 mg
  Filled 2019-01-24: qty 40

## 2019-01-24 MED ORDER — LACTATED RINGERS IV SOLN
1000.0000 mL | Freq: Once | INTRAVENOUS | Status: AC
  Administered 2019-01-24: 1000 mL via INTRAVENOUS

## 2019-01-24 MED ORDER — FENTANYL CITRATE (PF) 100 MCG/2ML IJ SOLN
25.0000 ug | INTRAMUSCULAR | Status: DC | PRN
  Administered 2019-01-24: 50 ug via INTRAVENOUS
  Filled 2019-01-24: qty 2

## 2019-01-24 MED ORDER — MIDAZOLAM HCL 5 MG/5ML IJ SOLN
1.0000 mg | INTRAMUSCULAR | Status: DC | PRN
  Filled 2019-01-24: qty 5

## 2019-01-24 NOTE — Patient Instructions (Signed)

## 2019-01-24 NOTE — Progress Notes (Signed)
Patient's Name: Chelsea Ramsey  MRN: [id number]  Referring Provider: Kathee Delton, MD  DOB: 19-Jul-1966  PCP: Kathee Delton, MD  DOS: 01/24/2019  Note by: Gaspar Cola, MD  Service setting: Ambulatory outpatient  Specialty: Interventional Pain Management  Patient type: Established  Location: [facility] (AMB) Pain Management Facility  Visit type: Interventional Procedure   Primary Reason for Visit: Interventional Pain Management Treatment. CC: Back Pain  Procedure:          Anesthesia, Analgesia, Anxiolysis:  Type: Diagnostic Sacroiliac Joint Steroid Injection #1 (NO STEROIDS)  Region: Superior Lumbosacral Region Level: PSIS (Posterior Superior Iliac Spine) Laterality: Bilateral  Type: Moderate (Conscious) Sedation combined with Local Anesthesia Indication(s): Analgesia and Anxiety Route: Intravenous (IV) IV Access: Secured Sedation: Meaningful verbal contact was maintained at all times during the procedure  Local Anesthetic: Lidocaine 1-2%  Position: Prone           Indications: 1. Other specified dorsopathies, sacral and sacrococcygeal region   2. Osteoarthritis of sacroiliac joints (Bilateral)   3. Chronic sacroiliac joint pain (Bilateral) (L>R)   4. Chronic low back pain (Primary Area of Pain) (Bilateral) (L>R) w/o sciatica    Pain Score: Pre-procedure: 3 /10 Post-procedure: 0-No pain/10  Pre-op Assessment:  Chelsea Ramsey is a 53 y.o. (year old), female patient, seen today for interventional treatment. 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 Chelsea Ramsey has a current medication list which includes the following prescription(s): bisacodyl, budesonide-formoterol, butalbital-acetaminophen-caffeine, gnp calcium 1200, vitamin d3, colchicine, furosemide, gabapentin,  guaifenesin, lactulose, magnesium oxide, meclizine, metolazone, metoprolol tartrate, nicotine, nitroglycerin, nortriptyline, omeprazole, oxycodone hcl, potassium chloride sa, prednisone, promethazine, rivaroxaban, tizanidine, topiramate, and ipratropium-albuterol, and the following Facility-Administered Medications: fentanyl and midazolam. Her primarily concern today is the Back Pain  Initial Vital Signs:  Pulse/HCG Rate: 87ECG Heart Rate: 85 Temp: 98 F (36.7 C) Resp: 16 BP: 108/85 SpO2: 96 %  BMI: Estimated body mass index is 45.38 kg/m as calculated from the following:   Height as of this encounter: 5' 4.8" (1.646 m).   Weight as of this encounter: 271 lb (122.9 kg).  Risk Assessment: Allergies: Reviewed. She is allergic to amiodarone; aspirin; flexeril [cyclobenzaprine]; trazamine [trazodone & diet manage prod]; codeine; and tramadol.  Allergy Precautions: None required Coagulopathies: Reviewed. None identified.  Blood-thinner therapy: None at this time Active Infection(s): Reviewed. None identified. Chelsea Ramsey is afebrile  Site Confirmation: Chelsea Ramsey was asked to confirm the procedure and laterality before marking the site Procedure checklist: Completed Consent: Before the procedure and under the influence of no sedative(s), amnesic(s), or anxiolytics, the patient was informed of the treatment options, risks and possible complications. To fulfill our ethical and legal obligations, as recommended by the American Medical Association's Code of Ethics, I have informed the patient of my clinical impression; the nature and purpose of the treatment or procedure; the risks, benefits, and possible complications of the intervention; the alternatives, including doing nothing; the risk(s) and benefit(s) of the alternative treatment(s) or procedure(s); and the risk(s) and benefit(s) of doing nothing. The patient was provided information about the general risks and possible complications  associated with the procedure. These may include, but are not limited to: failure to achieve desired goals, infection, bleeding, organ or nerve damage, allergic reactions, paralysis, and death. In addition, the patient was informed of those risks and complications associated to the procedure, such as  failure to decrease pain; infection; bleeding; organ or nerve damage with subsequent damage to sensory, motor, and/or autonomic systems, resulting in permanent pain, numbness, and/or weakness of one or several areas of the body; allergic reactions; (i.e.: anaphylactic reaction); and/or death. Furthermore, the patient was informed of those risks and complications associated with the medications. These include, but are not limited to: allergic reactions (i.e.: anaphylactic or anaphylactoid reaction(s)); adrenal axis suppression; blood sugar elevation that in diabetics may result in ketoacidosis or comma; water retention that in patients with history of congestive heart failure may result in shortness of breath, pulmonary edema, and decompensation with resultant heart failure; weight gain; swelling or edema; medication-induced neural toxicity; particulate matter embolism and blood vessel occlusion with resultant organ, and/or nervous system infarction; and/or aseptic necrosis of one or more joints. Finally, the patient was informed that Medicine is not an exact science; therefore, there is also the possibility of unforeseen or unpredictable risks and/or possible complications that may result in a catastrophic outcome. The patient indicated having understood very clearly. We have given the patient no guarantees and we have made no promises. Enough time was given to the patient to ask questions, all of which were answered to the patient's satisfaction. Chelsea Ramsey has indicated that she wanted to continue with the procedure. Attestation: I, the ordering provider, attest that I have discussed with the patient the  benefits, risks, side-effects, alternatives, likelihood of achieving goals, and potential problems during recovery for the procedure that I have provided informed consent. Date  Time: 01/24/2019 10:01 AM  Pre-Procedure Preparation:  Monitoring: As per clinic protocol. Respiration, ETCO2, SpO2, BP, heart rate and rhythm monitor placed and checked for adequate function Safety Precautions: Patient was assessed for positional comfort and pressure points before starting the procedure. Time-out: I initiated and conducted the "Time-out" before starting the procedure, as per protocol. The patient was asked to participate by confirming the accuracy of the "Time Out" information. Verification of the correct person, site, and procedure were performed and confirmed by me, the nursing staff, and the patient. "Time-out" conducted as per Joint Commission's Universal Protocol (UP.01.01.01). Time: 1109  Description of Procedure:          Target Area: Superior, posterior, aspect of the sacroiliac fissure Approach: Posterior, paraspinal, ipsilateral approach. Area Prepped: Entire Lower Lumbosacral Region Prepping solution: ChloraPrep (2% chlorhexidine gluconate and 70% isopropyl alcohol) Safety Precautions: Aspiration looking for blood return was conducted prior to all injections. At no point did we inject any substances, as a needle was being advanced. No attempts were made at seeking any paresthesias. Safe injection practices and needle disposal techniques used. Medications properly checked for expiration dates. SDV (single dose vial) medications used. Description of the Procedure: Protocol guidelines were followed. The patient was placed in position over the procedure table. The target area was identified and the area prepped in the usual manner. Skin & deeper tissues infiltrated with local anesthetic. Appropriate amount of time allowed to pass for local anesthetics to take effect. The procedure needle was advanced  under fluoroscopic guidance into the sacroiliac joint until a firm endpoint was obtained. Proper needle placement secured. Negative aspiration confirmed. Solution injected in intermittent fashion, asking for systemic symptoms every 0.5cc of injectate. The needles were then removed and the area cleansed, making sure to leave some of the prepping solution back to take advantage of its long term bactericidal properties. Vitals:   01/24/19 1114 01/24/19 1124 01/24/19 1134 01/24/19 1144  BP: 100/70 101/71 110/80 108/79  Pulse:      Resp: 15 16 15 16   Temp:  98.2 F (36.8 C)  97.7 F (36.5 C)  SpO2: 96% 100% 99% 99%  Weight:      Height:        Start Time: 1109 hrs. End Time: 1113 hrs. Materials:  Needle(s) Type: Spinal Needle Gauge: 22G Length: 3.5-in Medication(s): Please see orders for medications and dosing details.  Imaging Guidance (Non-Spinal):          Type of Imaging Technique: Fluoroscopy Guidance (Non-Spinal) Indication(s): Assistance in needle guidance and placement for procedures requiring needle placement in or near specific anatomical locations not easily accessible without such assistance. Exposure Time: Please see nurses notes. Contrast: Before injecting any contrast, we confirmed that the patient did not have an allergy to iodine, shellfish, or radiological contrast. Once satisfactory needle placement was completed at the desired level, radiological contrast was injected. Contrast injected under live fluoroscopy. No contrast complications. See chart for type and volume of contrast used. Fluoroscopic Guidance: I was personally present during the use of fluoroscopy. "Tunnel Vision Technique" used to obtain the best possible view of the target area. Parallax error corrected before commencing the procedure. "Direction-depth-direction" technique used to introduce the needle under continuous pulsed fluoroscopy. Once target was reached, antero-posterior, oblique, and lateral  fluoroscopic projection used confirm needle placement in all planes. Images permanently stored in EMR. Interpretation: I personally interpreted the imaging intraoperatively. Adequate needle placement confirmed in multiple planes. Appropriate spread of contrast into desired area was observed. No evidence of afferent or efferent intravascular uptake. Permanent images saved into the patient's record.  Antibiotic Prophylaxis:   Anti-infectives (From admission, onward)   None     Indication(s): None identified  Post-operative Assessment:  Post-procedure Vital Signs:  Pulse/HCG Rate: 8782 Temp: 97.7 F (36.5 C) Resp: 16 BP: 108/79 SpO2: 99 %  EBL: None  Complications: No immediate post-treatment complications observed by team, or reported by patient.  Note: The patient tolerated the entire procedure well. A repeat set of vitals were taken after the procedure and the patient was kept under observation following institutional policy, for this type of procedure. Post-procedural neurological assessment was performed, showing return to baseline, prior to discharge. The patient was provided with post-procedure discharge instructions, including a section on how to identify potential problems. Should any problems arise concerning this procedure, the patient was given instructions to immediately contact us, at any time, without hesitation. In any case, we plan to contact the patient by telephone for a follow-up status report regarding this interventional procedure.  Comments:  No additional relevant information.  Plan of Care    Imaging Orders     DG C-Arm 1-60 Min-No Report  Procedure Orders     SACROILIAC JOINT INJECTION  Medications ordered for procedure: Meds ordered this encounter  Medications  . lidocaine (XYLOCAINE) 2 % (with pres) injection 400 mg  . midazolam (VERSED) 5 MG/5ML injection 1-2 mg    Make sure Flumazenil is available in the pyxis when using this medication. If  oversedation occurs, administer 0.2 mg IV over 15 sec. If after 45 sec no response, administer 0.2 mg again over 1 min; may repeat at 1 min intervals; not to exceed 4 doses (1 mg)  . fentaNYL (SUBLIMAZE) injection 25-50 mcg    Make sure Narcan is available in the pyxis when using this medication. In the event of respiratory depression (RR< 8/min): Titrate NARCAN (naloxone) in increments of 0.1 to 0.2 mg IV at 2-3  minute intervals, until desired degree of reversal.  . lactated ringers infusion 1,000 mL  . ropivacaine (PF) 2 mg/mL (0.2%) (NAROPIN) injection 9 mL   Medications administered: We administered lidocaine, fentaNYL, lactated ringers, and ropivacaine (PF) 2 mg/mL (0.2%).  See the medical record for exact dosing, route, and time of administration.  Disposition: Discharge home  Discharge Date & Time: 01/24/2019; 1145 hrs.   Physician-requested Follow-up: Return for PPE (2 wks), w/ Dr. Dossie Arbour.  Future Appointments  Date Time Provider [place]  01/25/2019 11:30 AM [facility] None  01/26/2019  4:10 PM [facility] 3 [facility]  01/27/2019 11:30 AM [facility] None  01/30/2019 11:30 AM [facility] None  02/01/2019 11:30 AM [facility] None  02/03/2019 11:30 AM [facility] None  02/06/2019 11:30 AM [facility] None  02/08/2019 10:15 AM Milinda Pointer, MD [facility] None  02/08/2019 11:30 AM [facility] None  02/10/2019 11:30 AM [facility] None  02/13/2019 11:30 AM [facility] None  02/15/2019 11:30 AM [facility] None  02/17/2019 11:30 AM [facility] None  02/20/2019 11:30 AM [facility] None  02/22/2019 11:30 AM [facility] None  02/24/2019 11:30 AM  [facility] None  02/27/2019 11:30 AM [facility] None  03/01/2019 11:30 AM [facility] None  03/03/2019 11:30 AM [facility] None  03/06/2019 11:30 AM [facility] None  03/08/2019 11:30 AM [facility] None  03/10/2019 11:30 AM [facility] None  03/13/2019 11:30 AM [facility] None  03/15/2019 11:30 AM [facility] None  03/17/2019 11:30 AM [facility] None  03/20/2019 11:30 AM [facility] None  03/22/2019 11:30 AM [facility] None  03/24/2019 11:30 AM [facility] None  03/27/2019 11:30 AM [facility] None  03/29/2019 11:30 AM [facility] None  03/31/2019 11:30 AM [facility] None  04/03/2019 11:30 AM [facility] None  04/05/2019 11:30 AM [facility] None  04/07/2019 11:30 AM [facility] None  04/10/2019 11:30 AM [facility] None  04/12/2019 11:30 AM [facility] None  04/14/2019 11:30 AM [facility] None   Primary Care Physician: Kathee Delton, MD Location: [facility] Facility Note by: Gaspar Cola, MD Date: 01/24/2019; Time: 12:42 PM  Disclaimer:  Medicine is not an Chief Strategy Officer. The only guarantee in medicine is that nothing is guaranteed. It is important to note that the decision to proceed with this intervention was based on the information collected from the patient. The Data and conclusions were drawn from the patient's questionnaire, the interview, and the physical examination. Because the information was provided  in large part by the patient, it cannot be guaranteed that it has not been purposely or unconsciously manipulated. Every effort has been made to obtain as much relevant data as possible for this evaluation. It is important to note that the conclusions that lead to this procedure are derived in large part  from the available data. Always take into account that the treatment will also be dependent on availability of resources and existing treatment guidelines, considered by other Pain Management Practitioners as being common knowledge and practice, at the time of the intervention. For Medico-Legal purposes, it is also important to point out that variation in procedural techniques and pharmacological choices are the acceptable norm. The indications, contraindications, technique, and results of the above procedure should only be interpreted and judged by a Board-Certified Interventional Pain Specialist with extensive familiarity and expertise in the same exact procedure and technique.

## 2019-01-25 ENCOUNTER — Telehealth: Payer: Self-pay | Admitting: *Deleted

## 2019-01-25 NOTE — Telephone Encounter (Signed)
No problems post procedure. 

## 2019-01-26 ENCOUNTER — Ambulatory Visit: Payer: Self-pay | Admitting: Physician Assistant

## 2019-01-26 ENCOUNTER — Ambulatory Visit
Admission: RE | Admit: 2019-01-26 | Discharge: 2019-01-26 | Disposition: A | Discharge status: Home / Self Care | Payer: Medicaid Other | Source: Ambulatory Visit | Attending: Internal Medicine | Admitting: Internal Medicine

## 2019-01-26 DIAGNOSIS — Z1231 Encounter for screening mammogram for malignant neoplasm of breast: Secondary | ICD-10-CM

## 2019-01-30 ENCOUNTER — Encounter: Payer: Medicaid Other | Attending: Cardiovascular Disease

## 2019-01-30 DIAGNOSIS — R269 Unspecified abnormalities of gait and mobility: Secondary | ICD-10-CM | POA: Diagnosis present

## 2019-01-30 DIAGNOSIS — Z952 Presence of prosthetic heart valve: Secondary | ICD-10-CM

## 2019-01-30 DIAGNOSIS — I051 Rheumatic mitral insufficiency: Secondary | ICD-10-CM | POA: Diagnosis present

## 2019-01-30 DIAGNOSIS — I050 Rheumatic mitral stenosis: Secondary | ICD-10-CM | POA: Diagnosis present

## 2019-01-30 DIAGNOSIS — I5032 Chronic diastolic (congestive) heart failure: Secondary | ICD-10-CM

## 2019-01-30 LAB — GLUCOSE, CAPILLARY
Glucose-Capillary: 102 mg/dL — ABNORMAL HIGH (ref 70–99)
Glucose-Capillary: 104 mg/dL — ABNORMAL HIGH (ref 70–99)

## 2019-01-30 NOTE — Progress Notes (Signed)
Daily Session Note  Patient Details  Name: Chelsea Ramsey MRN: [id number] Date of Birth: Oct 24, 1966 Referring Provider:     Pulmonary Rehab from 01/16/2019 in [facility] Card[facility] Pul[facility]  Referring Provider  Isaias Cowman MD      Encounter Date: 01/30/2019  Check In:      Social History   Tobacco Use  Smoking Status Current Every Day Smoker  . Packs/day: 0.25  . Types: Cigarettes  Smokeless Tobacco Never Used  Tobacco Comment   01/16/19 still smoking 1-2 per day.  given fake cigarette to help    Goals Met:  Proper associated with RPD/PD & O2 Sat Independence with exercise equipment Exercise tolerated well Strength training completed today  Goals Unmet:  Not Applicable  Comments: Pt able to follow exercise prescription today without complaint.  Will continue to monitor for progression.    [name] is Medical Director for [facility] [facility].

## 2019-02-01 ENCOUNTER — Encounter: Payer: Medicaid Other | Admitting: *Deleted

## 2019-02-01 DIAGNOSIS — R269 Unspecified abnormalities of gait and mobility: Secondary | ICD-10-CM | POA: Diagnosis not present

## 2019-02-01 DIAGNOSIS — I5032 Chronic diastolic (congestive) heart failure: Secondary | ICD-10-CM

## 2019-02-01 LAB — GLUCOSE, CAPILLARY
Glucose-Capillary: 126 mg/dL — ABNORMAL HIGH (ref 70–99)
Glucose-Capillary: 105 mg/dL — ABNORMAL HIGH (ref 70–99)

## 2019-02-01 NOTE — Progress Notes (Signed)
Daily Session Note  Patient Details  Name: Chelsea Ramsey MRN: [id number] Date of Birth: [birth year] Referring Provider:     Pulmonary Rehab from 01/16/2019 in [facility] Cardiac and Pulmonary Rehab  Referring Provider  Isaias Cowman MD      Encounter Date: 02/01/2019  Check In: Session Check In - 02/01/19 1130      Check-In   Supervising physician immediately available to respond to emergencies  [facility] immediately available ER MD    Physician(s)  Dr. Corky Downs and Joni Fears    Location  [facility]-Cardiac & Pulmonary Rehab    Staff Present  Renita Papa, RN BSN;Joseph Darrin Nipper, [place], RCEP, CCRP, Exercise Physiologist    Medication changes reported      No    Fall or balance concerns reported     No    Tobacco Cessation  No Change    Warm-up and Cool-down  Performed as group-led instruction    Resistance Training Performed  Yes    VAD Patient?  No    PAD/SET Patient?  No      Pain Assessment   Currently in Pain?  No/denies          Social History   Tobacco Use  Smoking Status Current Every Day Smoker  . Packs/day: 0.25  . Types: Cigarettes  Smokeless Tobacco Never Used  Tobacco Comment   01/16/19 still smoking 1-2 per day.  given fake cigarette to help    Goals Met:  Proper associated with RPD/PD & O2 Sat Independence with exercise equipment Using PLB without cueing & demonstrates good technique Exercise tolerated well No report of cardiac concerns or symptoms Strength training completed today  Goals Unmet:  Not Applicable  Comments: Pt able to follow exercise prescription today without complaint.  Will continue to monitor for progression.    [name] is Medical Director for [place] [facility].

## 2019-02-05 NOTE — Progress Notes (Signed)
Patient's Name: Chelsea Ramsey  MRN: [id number]  Referring Provider: Kathee Delton, MD  DOB: 1966-10-14  PCP: Kathee Delton, MD  DOS: 02/08/2019  Note by: Gaspar Cola, MD  Service setting: Ambulatory outpatient  Specialty: Interventional Pain Management  Location: [facility] (AMB) Pain Management Facility    Patient type: Established   Primary Reason(s) for Visit: Encounter for post-procedure evaluation of chronic illness with mild to moderate exacerbation CC: Back Pain  HPI  Chelsea Ramsey is a 53 y.o. year old, female patient, who comes today for a post-procedure evaluation. She has Osteoarthritis of knee (Right); COPD (chronic obstructive pulmonary disease) with acute bronchitis (HCC); COPD (chronic obstructive pulmonary disease) ([place]); A-fib [facility]); GIB (gastrointestinal bleeding); Intractable vomiting with nausea; Melena; Nausea and vomiting; Acute esophagogastric ulcer; Acute CHF (congestive heart failure) ([place]); Chronic diastolic heart failure ([place]); HTN (hypertension); Chronic low back pain (Primary Area of Pain) (Bilateral) w/ sciatica (Left); Chronic lower extremity pain (Secondary Area of Pain) (Left); Sternal pain (Tertiary Area of Pain); Fibromyalgia (Fourth Area of Pain); Chronic knee pain (Right); Chronic sacroiliac joint pain (Bilateral) (L>R); Chronic pain syndrome; Long term current use of opiate analgesic; Pharmacologic therapy; Disorder of skeletal system; Problems influencing health status; Chest pain; Class 3 severe obesity with body mass index (BMI) of 45.0 to 49.9 in adult [facility]); Chronic anticoagulation (XARELTO); Vitamin D deficiency; Chronic knee pain after total replacement (Fifth Area of Pain) (Right); CAP (community acquired pneumonia); CHF (congestive heart failure) ([place]); Elevated sed rate; Elevated C-reactive protein; Diplopia; Dizziness; Headache, chronic daily; Numbness and tingling of both feet; Spondylosis without myelopathy or radiculopathy, lumbar  region; Lumbar facet syndrome (Bilateral); DDD (degenerative disc disease), lumbar; Chronic low back pain (Primary Area of Pain) (Bilateral) (L>R) w/o sciatica; Abnormal MRI, lumbar spine (01/05/2018); Lumbar facet arthropathy (Bilateral); Depression; GAD (generalized anxiety disorder); History of substance abuse ([place]); Insomnia; Moderate to severe pulmonary hypertension ([place]); Rheumatic heart disease, unspecified; S/P MVR (mitral valve replacement); Shortness of breath; Tobacco dependence; History of cocaine use; Chronic gout involving toe of foot, unspecified cause (Left); Chronic hip pain (Left); Osteoarthritis of sacroiliac joints (Bilateral); Other specified dorsopathies, sacral and sacrococcygeal region; Elevated uric acid in blood; Hyperglycemia; Secondary osteoarthritis of multiple sites; Osteoarthritis of knees (Bilateral) (R>L); Chronic gout of multiple sites, unspecified cause; Chronic musculoskeletal pain (Fourth Area of Pain); Epidural lipomatosis; and Osteoarthritis of lumbar spine on their problem list. Her primarily concern today is the Back Pain  Pain Assessment: Location: Lower Back Radiating: pain radiaties down left leg Onset: More than a month ago Duration: Chronic pain Quality: Constant, Crushing, Numbness Severity: 3 /10 (subjective, self-reported pain score)  Note: Reported level is compatible with observation.                         When using our objective Pain Scale, levels between 6 and 10/10 are said to belong in an emergency room, as it progressively worsens from a 6/10, described as severely limiting, requiring emergency care not usually available at an outpatient pain management facility. At a 6/10 level, communication becomes difficult and requires great effort. Assistance to reach the emergency department may be required. Facial flushing and profuse sweating along with potentially dangerous increases in heart rate and blood pressure will be evident. Effect on ADL: limits my  daily activities Timing: Constant Modifying factors: nothing BP: 112/86  HR: 87  Chelsea Ramsey comes in today for post-procedure evaluation.  Today I went over the findings with the patient  including a long talk about bringing her BMI down to 30.  I also informed the patient that once her BMI gets to 35, we could do a radiofrequency ablation of the lumbar facets in order to provide her with longer lasting benefit.  However, it is crucial that she bring her weight down since most of her problems are secondary to the osteoarthritis.  We also talked about her gout and I provided her with a copy some information regarding this condition.  I also restarted her on allopurinol and colchicine.  Further details on both, my assessment(s), as well as the proposed treatment plan, please see below.  Post-Procedure Assessment  01/24/2019 Procedure: Diagnostic bilateral sacroiliac joint block #1, (NO STEROIDS)under fluoroscopic guidance and IV sedation. Pre-procedure pain score:  3/10 Post-procedure pain score: 0/10 (100% relief) Influential Factors: BMI: 46.52 kg/m Intra-procedural challenges: None observed.         Assessment challenges: None detected.              Reported side-effects: None.        Post-procedural adverse reactions or complications: None reported         Sedation: Sedation provided. When no sedatives are used, the analgesic levels obtained are directly associated to the effectiveness of the local anesthetics. However, when sedation is provided, the level of analgesia obtained during the initial 1 hour following the intervention, is believed to be the result of a combination of factors. These factors may include, but are not limited to: 1. The effectiveness of the local anesthetics used. 2. The effects of the analgesic(s) and/or anxiolytic(s) used. 3. The degree of discomfort experienced by the patient at the time of the procedure. 4. The patients ability and reliability in recalling  and recording the events. 5. The presence and influence of possible secondary gains and/or psychosocial factors. Reported result: Relief experienced during the 1st hour after the procedure: 100 % (Ultra-Short Term Relief)            Interpretative annotation: Clinically appropriate result. Analgesia during this period is likely to be Local Anesthetic and/or IV Sedative (Analgesic/Anxiolytic) related.          Effects of local anesthetic: The analgesic effects attained during this period are directly associated to the localized infiltration of local anesthetics and therefore cary significant diagnostic value as to the etiological location, or anatomical origin, of the pain. Expected duration of relief is directly dependent on the pharmacodynamics of the local anesthetic used. Long-acting (4-6 hours) anesthetics used.  Reported result: Relief during the next 4 to 6 hour after the procedure: 100 % (Short-Term Relief)            Interpretative annotation: Clinically appropriate result. Analgesia during this period is likely to be Local Anesthetic-related.          Long-term benefit: Defined as the period of time past the expected duration of local anesthetics (1 hour for short-acting and 4-6 hours for long-acting). With the possible exception of prolonged sympathetic blockade from the local anesthetics, benefits during this period are typically attributed to, or associated with, other factors such as analgesic sensory neuropraxia, antiinflammatory effects, or beneficial biochemical changes provided by agents other than the local anesthetics.  Reported result: Extended relief following procedure: 50 %(for 3 days) (Long-Term Relief)            Interpretative annotation: Clinically possible results. Recurrence of symptoms. No permanent benefit expected. Chelsea Ramsey requested to have NO STEROIDS injected.  Current benefits: Defined as reported results that persistent at this point in time.    Analgesia: 0 %            Function: Back to baseline ROM: Back to baseline Interpretative annotation: Recurrence of symptoms. No permanent benefit expected. Results would suggest persistent aggravating factors.          Interpretation: Results would suggest a successful diagnostic intervention.                  Plan:  Please see "Plan of Care" for details.                Laboratory Chemistry  Inflammation Markers (CRP: Acute Phase) (ESR: Chronic Phase) Lab Results  Component Value Date   CRP 11 (H) 10/10/2018   ESRSEDRATE 79 (H) 10/10/2018                         Renal Markers Lab Results  Component Value Date   BUN 12 12/27/2018   CREATININE 0.59 12/27/2018   BCR 18 10/10/2018   GFRAA >60 12/27/2018   GFRNONAA >60 12/27/2018                             Hepatic Markers Lab Results  Component Value Date   AST 18 12/01/2018   ALT 20 12/01/2018   ALBUMIN 3.7 12/01/2018                        Note: Lab results reviewed.  Recent Imaging Results   Results for orders placed in visit on 01/24/19  DG C-Arm 1-60 Min-No Report   Narrative Fluoroscopy was utilized by the requesting physician.  No radiographic  interpretation.    Interpretation Report: Fluoroscopy was used during the procedure to assist with needle guidance. The images were interpreted intraoperatively by the requesting physician.        Meds   Current Outpatient Medications:  .  bisacodyl (DULCOLAX) 5 MG EC tablet, Take 2 tablets (10 mg total) by mouth daily as needed for moderate constipation., Disp: 30 tablet, Rfl: 0 .  budesonide-formoterol (SYMBICORT) 160-4.5 MCG/ACT inhaler, Inhale 2 puffs into the lungs 2 (two) times daily., Disp: , Rfl:  .  butalbital-acetaminophen-caffeine (FIORICET, ESGIC) 50-325-40 MG tablet, Take 1 tablet by mouth as needed., Disp: , Rfl:  .  Calcium Carbonate-Vit D-Min (GNP CALCIUM 1200) 1200-1000 MG-UNIT CHEW, Chew 1,200 mg by mouth daily with breakfast. Take in combination  with vitamin D and magnesium., Disp: 30 tablet, Rfl: 5 .  Cholecalciferol (VITAMIN D3) 125 MCG (5000 UT) CAPS, Take 1 capsule (5,000 Units total) by mouth daily with breakfast. Take along with calcium and magnesium., Disp: 30 capsule, Rfl: 5 .  colchicine 0.6 MG tablet, Take 1 tablet (0.6 mg total) by mouth daily for 30 days., Disp: 30 tablet, Rfl: 0 .  furosemide (LASIX) 40 MG tablet, Take 1 tablet (40 mg total) by mouth 2 (two) times daily., Disp: 60 tablet, Rfl: 11 .  gabapentin (NEURONTIN) 600 MG tablet, Take 0.5 tablets (300 mg total) by mouth 2 (two) times daily. (Patient taking differently: Take 600 mg by mouth 3 (three) times daily. ), Disp: 60 tablet, Rfl: 0 .  guaiFENesin (MUCINEX) 600 MG 12 hr tablet, Take 1 tablet (600 mg total) by mouth 2 (two) times daily., Disp: 20 tablet, Rfl: 0 .  lactulose (CHRONULAC) 10 GM/15ML solution, Take 45  mLs (30 g total) by mouth 2 (two) times daily as needed for mild constipation., Disp: 240 mL, Rfl: 0 .  magnesium oxide (MAG-OX) 400 MG tablet, Take 400 mg by mouth 2 (two) times daily., Disp: , Rfl:  .  meclizine (ANTIVERT) 12.5 MG tablet, Take 25 mg by mouth 3 (three) times daily as needed for dizziness., Disp: , Rfl:  .  metolazone (ZAROXOLYN) 2.5 MG tablet, Take 1 tablet (2.5 mg total) by mouth daily., Disp: 10 tablet, Rfl: 0 .  metoprolol tartrate (LOPRESSOR) 100 MG tablet, Take 1 tablet (100 mg total) by mouth 2 (two) times daily., Disp: 60 tablet, Rfl: 3 .  nicotine (NICODERM CQ - DOSED IN MG/24 HR) 7 mg/24hr patch, Place 1 patch (7 mg total) onto the skin daily., Disp: 28 patch, Rfl: 0 .  nitroGLYCERIN (NITROSTAT) 0.4 MG SL tablet, Place 1 tablet (0.4 mg total) under the tongue every 5 (five) minutes as needed for chest pain., Disp: 30 tablet, Rfl: 12 .  nortriptyline (PAMELOR) 10 MG capsule, Take 10 mg by mouth Nightly., Disp: , Rfl:  .  omeprazole (PRILOSEC) 40 MG capsule, Take 40 mg by mouth 2 (two) times daily., Disp: , Rfl:  .  [START ON  02/19/2019] Oxycodone HCl 10 MG TABS, Take 0.5-1 tablets (5-10 mg total) by mouth 2 (two) times daily as needed for up to 30 days. Must last 30 days. MAX.: 2/day, Disp: 60 tablet, Rfl: 0 .  potassium chloride SA (K-DUR,KLOR-CON) 20 MEQ tablet, Take 1 tablet (20 mEq total) by mouth daily. And additional tablet when taking metolazone, Disp: 45 tablet, Rfl: 5 .  promethazine (PHENERGAN) 12.5 MG tablet, Take 1 tablet (12.5 mg total) by mouth every 6 (six) hours as needed for nausea or vomiting., Disp: 30 tablet, Rfl: 0 .  rivaroxaban (XARELTO) 20 MG TABS tablet, Take 20 mg by mouth every evening. , Disp: , Rfl:  .  tiZANidine (ZANAFLEX) 4 MG tablet, Take 4 mg by mouth at bedtime. , Disp: , Rfl:  .  topiramate (TOPAMAX) 25 MG tablet, Take 25 mg by mouth 2 (two) times daily. , Disp: , Rfl:  .  allopurinol (ZYLOPRIM) 100 MG tablet, Take 1 tablet (100 mg total) by mouth daily for 30 days., Disp: 30 tablet, Rfl: 0 .  ipratropium-albuterol (DUONEB) 0.5-2.5 (3) MG/3ML SOLN, Inhale 3 mLs into the lungs 3 (three) times daily as needed (respiratory). , Disp: , Rfl:   ROS  Constitutional: Denies any fever or chills Gastrointestinal: No reported hemesis, hematochezia, vomiting, or acute GI distress Musculoskeletal: Denies any acute onset joint swelling, redness, loss of ROM, or weakness Neurological: No reported episodes of acute onset apraxia, aphasia, dysarthria, agnosia, amnesia, paralysis, loss of coordination, or loss of consciousness  Allergies  Chelsea Ramsey is allergic to amiodarone; aspirin; flexeril [cyclobenzaprine]; trazamine [trazodone & diet manage prod]; codeine; and tramadol.  [place]  Drug: Chelsea Ramsey  reports previous drug use. Alcohol:  reports previous alcohol use of about 3.0 standard drinks of alcohol per week. Tobacco:  reports that she has been smoking cigarettes. She has been smoking about 0.25 packs per day. She has never used smokeless tobacco. Medical:  has a past medical history of  Acute drug-induced gout of left foot (03/01/2018), Allergy, Anxiety, Arthritis, Asthma, CHF (congestive heart failure) ([place]), COPD (chronic obstructive pulmonary disease) ([place]), Coronary artery disease, Fibromyalgia, GERD (gastroesophageal reflux disease), Hypertension, Pneumonia, PUD (peptic ulcer disease), Pulmonary HTN ([place]), Rheumatic fever/heart disease, and Sleep apnea. Surgical: Chelsea Ramsey  has a  past surgical history that includes Tonsillectomy; Adenoidectomy; Multiple tooth extractions; Total knee arthroplasty (Right, 09/04/2016); Esophagogastroduodenoscopy (egd) with propofol (N/A, 05/25/2018); and Mitral valve replacement. Family: family history includes Asthma in her mother; COPD in her mother; Cancer in her mother; Congestive Heart Failure in her father.  Constitutional Exam  General appearance: Well nourished, well developed, and well hydrated. In no apparent acute distress Vitals:   02/08/19 0914  BP: 112/86  Pulse: 87  Temp: 97.6 F (36.4 C)  SpO2: 100%  Weight: 271 lb (122.9 kg)  Height: 5' 4"  (1.626 m)   BMI Assessment: Estimated body mass index is 46.52 kg/m as calculated from the following:   Height as of this encounter: 5' 4"  (1.626 m).   Weight as of this encounter: 271 lb (122.9 kg).  BMI interpretation table: BMI level Category Range association with higher incidence of chronic pain  <18 kg/m2 Underweight   18.5-24.9 kg/m2 Ideal body weight   25-29.9 kg/m2 Overweight Increased incidence by 20%  30-34.9 kg/m2 Obese (Class I) Increased incidence by 68%  35-39.9 kg/m2 Severe obesity (Class II) Increased incidence by 136%  >40 kg/m2 Extreme obesity (Class III) Increased incidence by 254%   Patient's current BMI Ideal Body weight  Body mass index is 46.52 kg/m. Ideal body weight: 54.7 kg (120 lb 9.5 oz) Adjusted ideal body weight: 82 kg (180 lb 12.1 oz)   BMI Readings from Last 4 Encounters:  02/08/19 46.52 kg/m  01/24/19 45.38 kg/m  01/18/19 46.86 kg/m   01/16/19 43.58 kg/m   Wt Readings from Last 4 Encounters:  02/08/19 271 lb (122.9 kg)  01/24/19 271 lb (122.9 kg)  01/18/19 273 lb (123.8 kg)  01/16/19 273 lb 4.8 oz (124 kg)  Psych/Mental status: Alert, oriented x 3 (person, place, & time)       Eyes: PERLA Respiratory: No evidence of acute respiratory distress  Cervical Spine Area Exam  Skin & Axial Inspection: No masses, redness, edema, swelling, or associated skin lesions Alignment: Symmetrical Functional ROM: Unrestricted ROM      Stability: No instability detected Muscle Tone/Strength: Functionally intact. No obvious neuro-muscular anomalies detected. Sensory (Neurological): Unimpaired Palpation: No palpable anomalies              Upper Extremity (UE) Exam    Side: Right upper extremity  Side: Left upper extremity  Skin & Extremity Inspection: Skin color, temperature, and hair growth are WNL. No peripheral edema or cyanosis. No masses, redness, swelling, asymmetry, or associated skin lesions. No contractures.  Skin & Extremity Inspection: Skin color, temperature, and hair growth are WNL. No peripheral edema or cyanosis. No masses, redness, swelling, asymmetry, or associated skin lesions. No contractures.  Functional ROM: Unrestricted ROM          Functional ROM: Unrestricted ROM          Muscle Tone/Strength: Functionally intact. No obvious neuro-muscular anomalies detected.  Muscle Tone/Strength: Functionally intact. No obvious neuro-muscular anomalies detected.  Sensory (Neurological): Unimpaired          Sensory (Neurological): Unimpaired          Palpation: No palpable anomalies              Palpation: No palpable anomalies              Provocative Test(s):  Phalen's test: deferred Tinel's test: deferred Apley's scratch test (touch opposite shoulder):  Action 1 (Across chest): deferred Action 2 (Overhead): deferred Action 3 (LB reach): deferred   Provocative Test(s):  Phalen's test:  deferred Tinel's test:  deferred Apley's scratch test (touch opposite shoulder):  Action 1 (Across chest): deferred Action 2 (Overhead): deferred Action 3 (LB reach): deferred    Thoracic Spine Area Exam  Skin & Axial Inspection: No masses, redness, or swelling Alignment: Symmetrical Functional ROM: Unrestricted ROM Stability: No instability detected Muscle Tone/Strength: Functionally intact. No obvious neuro-muscular anomalies detected. Sensory (Neurological): Unimpaired Muscle strength & Tone: No palpable anomalies  Lumbar Spine Area Exam  Skin & Axial Inspection: No masses, redness, or swelling Alignment: Symmetrical Functional ROM: Decreased ROM affecting both sides Stability: No instability detected Muscle Tone/Strength: Functionally intact. No obvious neuro-muscular anomalies detected. Sensory (Neurological): Unimpaired Palpation: No palpable anomalies       Provocative Tests: Hyperextension/rotation test: (+) bilaterally for facet joint pain. Lumbar quadrant test (Kemp's test): deferred today       Lateral bending test: deferred today       Patrick's Maneuver: (+) for bilateral S-I arthralgia             FABER* test: deferred today                   S-I anterior distraction/compression test: deferred today         S-I lateral compression test: deferred today         S-I Thigh-thrust test: deferred today         S-I Gaenslen's test: deferred today         *(Flexion, ABduction and External Rotation)  Gait & Posture Assessment  Ambulation: Patient ambulates using a cane Gait: Limited. Using assistive device to ambulate Posture: Difficulty standing up straight, due to pain   Lower Extremity Exam    Side: Right lower extremity  Side: Left lower extremity  Stability: No instability observed          Stability: No instability observed          Skin & Extremity Inspection: Skin color, temperature, and hair growth are WNL. No peripheral edema or cyanosis. No masses, redness, swelling, asymmetry, or  associated skin lesions. No contractures.  Skin & Extremity Inspection: Skin color, temperature, and hair growth are WNL. No peripheral edema or cyanosis. No masses, redness, swelling, asymmetry, or associated skin lesions. No contractures.  Functional ROM: Unrestricted ROM                  Functional ROM: Unrestricted ROM                  Muscle Tone/Strength: Functionally intact. No obvious neuro-muscular anomalies detected.  Muscle Tone/Strength: Functionally intact. No obvious neuro-muscular anomalies detected.  Sensory (Neurological): Unimpaired        Sensory (Neurological): Unimpaired        DTR: Patellar: deferred today Achilles: deferred today Plantar: deferred today  DTR: Patellar: deferred today Achilles: deferred today Plantar: deferred today  Palpation: No palpable anomalies  Palpation: No palpable anomalies   Assessment   Status Diagnosis  Persistent Persistent Temporarily improved 1. Chronic low back pain (Primary Area of Pain) (Bilateral) w/ sciatica (Left)   2. Chronic lower extremity pain (Secondary Area of Pain) (Left)   3. Chronic sacroiliac joint pain (Bilateral) (L>R)   4. Lumbar facet syndrome (Bilateral)   5. Sternal pain (Tertiary Area of Pain)   6. Chronic hip pain (Left)   7. Chronic knee pain after total replacement (Fifth Area of Pain) (Right)   8. Class 3 severe obesity due to excess calories with serious comorbidity and body mass index (  BMI) of 45.0 to 49.9 in adult ([place])   9. Secondary osteoarthritis of multiple sites   10. Osteoarthritis of knees (Bilateral) (R>L)   11. Osteoarthritis of sacroiliac joints (Bilateral)   12. Osteoarthritis of lumbar spine   13. Elevated uric acid in blood   14. Chronic gout of multiple sites, unspecified cause   15. Epidural lipomatosis   16. Abnormal MRI, lumbar spine (01/05/2018)   17. Chronic pain syndrome   18. Pharmacologic therapy      Updated Problems: Problem  Secondary Osteoarthritis of Multiple Sites   Osteoarthritis of knees (Bilateral) (R>L)  Chronic gout of multiple sites, unspecified cause  Chronic musculoskeletal pain (Fourth Area of Pain)  Epidural Lipomatosis  Osteoarthritis of lumbar spine  Abnormal MRI, lumbar spine (01/05/2018)   01/23/19 Lumbar MRI FINDINGS: Segmentation: 5 non rib-bearing lumbar type vertebral bodies are present. The lowest fully formed vertebral body is L5. Alignment:  AP alignment is anatomic. Vertebrae:  Marrow signal and vertebral body heights are normal. Conus medullaris and cauda equina: Conus extends to the T12-L1 level. Conus and cauda equina appear normal. Paraspinal and other soft tissues: Limited imaging the abdomen is unremarkable. There is no significant adenopathy. No solid organ lesions are present.  Disc levels: L1-2: Negative. L2-3: Mild facet hypertrophy is present. No focal disc protrusion or stenosis is present. L3-4: A broad-based disc protrusion extends into the foramina bilaterally without significant stenosis or change. L4-5: A broad-based disc protrusion extends into the foramina bilaterally. There is mild facet hypertrophy. There is no significant stenosis or change. L5-S1: A leftward disc protrusion is again noted. Disc material contacts the exiting left L5 nerve root. Conjoined left nerve root sleeve is again noted. Mild left subarticular stenosis is present as well.  IMPRESSION: 1. Stable left lateral disc protrusion at L5-S1 with mild left subarticular and foraminal stenosis. 2. Stable mild disc bulging at L3-4 and L4-5 without significant stenosis at these levels.   01/05/18 Lumbar Spine MRI FINDINGS: Vertebrae: L5 hemangioma.  Disc levels: L1-2: Mild spondylosis. L3-4: Stable mild disc bulging most notable at the foramina. Mild epidural fat expansion. L4-5: Stable disc bulging and left foraminal annular fissure. Thecal sac partial effacement from epidural fat expansion that is progressed. L5-S1: Conjoined root sleeve on  the left.  IMPRESSION: 1. Mild disc degeneration without progression since July 2018 comparison. No degenerative impingement. 2. Epidural fat expansion that has progressed from 2018, with moderate thecal sac effacement at L4-5.   Chronic knee pain after total replacement (Fifth Area of Pain) (Right)  Chronic knee pain (Right)  Elevated Uric Acid in Blood  Hyperglycemia  Acute Drug-Induced Gout of Left Foot (Resolved)   Last Assessment & Plan:  Likely at least partially brought on by diuresis.  Needs to continue to diurese  Will push hydration Stop allopurinol given initiation during acute flare may worsen this, re-broach this when asymptomatic Avoid nsaids given stomach pain Trial colchicine Add acetaminophen Ice, elevate, rest     Plan of Care  Pharmacotherapy (Medications Ordered): Meds ordered this encounter  Medications  . colchicine 0.6 MG tablet    Sig: Take 1 tablet (0.6 mg total) by mouth daily for 30 days.    Dispense:  30 tablet    Refill:  0    Do not place medication on "Automatic Refill". Fill one day early if pharmacy is closed on scheduled refill date.  Marland Kitchen allopurinol (ZYLOPRIM) 100 MG tablet    Sig: Take 1 tablet (100 mg total) by mouth daily  for 30 days.    Dispense:  30 tablet    Refill:  0    Do not place medication on "Automatic Refill". Fill one day early if pharmacy is closed on scheduled refill date.   Medications administered today: Chelsea Ramsey had no medications administered during this visit.  Orders:  Orders Placed This Encounter  Procedures  . ToxASSURE Select 13 (MW), Urine    Volume: 30 ml(s). Minimum 3 ml of urine is needed. Document temperature of fresh sample. Indications: Long term (current) use of opiate analgesic (Z79.891)    Lab Orders     ToxASSURE Select 13 (MW), Urine Imaging Orders  No imaging studies ordered today   Referral Orders  No referral(s) requested today   Planned follow-up:   Return in about 1 month  (around 03/11/2019) for Med-Mgmt, w/ Dr. Dossie Arbour. Patient started today on Allopurinol & Colchicine qd. UDS done today. NOTE: XARELTOAnticoagulation.(Stop:x 3 days prior to procedures. Re-start:6 hours after) - (NO STEROIDS- CHF) - NO RFA until BMI < 35   Interventional management options: Considering:   Diagnostic bilateral lumbar facet nerve blocks(100/100/40 w/o steroids) Diagnostic bilateral lumbar facet RFA(NO RFA until BMI <35) Diagnostic bilateral sacroiliac joint block  Possible bilateral sacroiliac joint RFA (NO RFA until BMI <35) Diagnostic right knee genicular nerve block Possible right knee genicular nerve RFA   Palliative PRN treatment(s):   None at this time   Future Appointments  Date Time Provider [place]  02/10/2019 11:30 AM [facility] None  02/13/2019 11:30 AM [facility] None  02/15/2019 11:30 AM [facility] None  02/17/2019 11:30 AM [facility] None  02/20/2019 11:30 AM [facility] None  02/22/2019 11:30 AM [facility] None  02/24/2019 11:30 AM [facility] None  02/27/2019 11:30 AM [facility] None  03/01/2019 11:30 AM [facility] None  03/03/2019 11:30 AM [facility] None  03/06/2019 11:30 AM [facility] None  03/07/2019 11:30 AM Vevelyn Francois, NP [facility] None  03/08/2019 11:30 AM [facility] None  03/09/2019 11:20 AM Trinna Post, PA-C [facility] None  03/10/2019 11:30 AM [facility] None  03/13/2019 11:30 AM [facility] None  03/15/2019 11:30 AM [facility] None  03/17/2019 11:30 AM [facility] None  03/20/2019 11:30 AM [facility]  [facility] None  03/22/2019 11:30 AM [facility] None  03/24/2019 11:30 AM [facility] None  03/27/2019 11:30 AM [facility] None  03/29/2019 11:30 AM [facility] None  03/31/2019 11:30 AM [facility] None  04/03/2019 11:30 AM [facility] None  04/05/2019 11:30 AM [facility] None  04/07/2019 11:30 AM [facility] None  04/10/2019 11:30 AM [facility] None  04/12/2019 11:30 AM [facility] None  04/14/2019 11:30 AM [facility] None   Primary Care Physician: Kathee Delton, MD Location: [facility] Note by: Gaspar Cola, MD Date: 02/08/2019; Time: 12:36 PM

## 2019-02-06 DIAGNOSIS — R269 Unspecified abnormalities of gait and mobility: Secondary | ICD-10-CM | POA: Diagnosis not present

## 2019-02-06 DIAGNOSIS — I5032 Chronic diastolic (congestive) heart failure: Secondary | ICD-10-CM

## 2019-02-06 NOTE — Progress Notes (Signed)
Daily Session Note  Patient Details  Name: Chelsea Ramsey MRN: [id number] Date of Birth: 03/19/1966 Referring Provider:     Pulmonary Rehab from 01/16/2019 in [facility]  Referring Provider  Isaias Cowman MD      Encounter Date: 02/06/2019  Check In: Session Check In - 02/06/19 1131      Check-In   Supervising physician immediately available to respond to emergencies  [facility] immediately available ER MD    Physician(s)  Jimmye Norman and Quentin Cornwall    Location  [facility]    Staff Present  Earlean Shawl, BS, ACSM CEP, Exercise Physiologist;Joseph Foy [place], [place], ACSM CEP, Exercise Physiologist    Medication changes reported      No    Fall or balance concerns reported     No    Warm-up and Cool-down  Performed as group-led instruction    Resistance Training Performed  Yes    VAD Patient?  No    PAD/SET Patient?  No      Pain Assessment   Currently in Pain?  No/denies    Multiple Pain Sites  No        Exercise Prescription Changes - 02/06/19 1100      Response to Exercise   Symptoms  none    Comments  first full day of exercise    Duration  Progress to 45 minutes of aerobic exercise without signs/symptoms of physical distress    Intensity  THRR unchanged      Progression   Progression  Continue to progress workloads to maintain intensity without signs/symptoms of physical distress.    Average METs  2.42      Resistance Training   Training Prescription  Yes    Weight  3 lbs    Reps  10-15      Interval Training   Interval Training  No      Treadmill   MPH  1.5    Grade  0.5    Minutes  15    METs  2.25      REL-XR   Level  1    Minutes  15    METs  2.6      Home Exercise Plan   Plans to continue exercise at  Home (comment)   walks at park near house 1-2 days per week outside of class   Frequency  Add 2 additional days to program exercise sessions.    Initial Home Exercises  Provided  02/06/19       Social History   Tobacco Use  Smoking Status Current Every Day Smoker  . Packs/day: 0.25  . Types: Cigarettes  Smokeless Tobacco Never Used  Tobacco Comment   01/16/19 still smoking 1-2 per day.  given fake cigarette to help    Goals Met:  Proper associated with RPD/PD & O2 Sat [place] with exercise equipment Exercise tolerated well Strength training completed today  Goals Unmet:  Not Applicable  Comments: Pt able to follow exercise prescription today without complaint.  Will continue to monitor for progression.  Reviewed home exercise with pt today.  Pt plans to walk at a park 1-2 days per week and do strengthening exercises at home for exercise.  Reviewed THR, pulse, RPE, sign and symptoms, NTG use, and when to call 911 or MD.  Also discussed weather considerations and indoor options.  Pt voiced understanding.    [name] is Medical Director for [facility] and [facility].

## 2019-02-06 NOTE — Progress Notes (Signed)
Pulmonary Individual Treatment Plan  Patient Details  Name: Dietra Stokely MRN: [id number] Date of Birth: 1966/05/02 Referring Provider:     Pulmonary Rehab from 01/16/2019 in [facility] Cardiac and Pulmonary Rehab  Referring Provider  Isaias Cowman MD      Initial Encounter Date:    Pulmonary Rehab from 01/16/2019 in [facility] Cardiac and Pulmonary Rehab  Date  01/16/19      Visit Diagnosis: Heart failure, diastolic, chronic ([place])  Patient's Home Medications on Admission:  Current Outpatient Medications:  .  bisacodyl (DULCOLAX) 5 MG EC tablet, Take 2 tablets (10 mg total) by mouth daily as needed for moderate constipation., Disp: 30 tablet, Rfl: 0 .  budesonide-formoterol (SYMBICORT) 160-4.5 MCG/ACT inhaler, Inhale 2 puffs into the lungs 2 (two) times daily., Disp: , Rfl:  .  butalbital-acetaminophen-caffeine (FIORICET, ESGIC) 50-325-40 MG tablet, Take 1 tablet by mouth as needed., Disp: , Rfl:  .  Calcium Carbonate-Vit D-Min (GNP CALCIUM 1200) 1200-1000 MG-UNIT CHEW, Chew 1,200 mg by mouth daily with breakfast. Take in combination with vitamin D and magnesium., Disp: 30 tablet, Rfl: 5 .  Cholecalciferol (VITAMIN D3) 125 MCG (5000 UT) CAPS, Take 1 capsule (5,000 Units total) by mouth daily with breakfast. Take along with calcium and magnesium., Disp: 30 capsule, Rfl: 5 .  colchicine 0.6 MG tablet, Take 1 tablet (0.6 mg total) by mouth 2 (two) times daily., Disp: 14 tablet, Rfl: 0 .  furosemide (LASIX) 40 MG tablet, Take 1 tablet (40 mg total) by mouth 2 (two) times daily., Disp: 60 tablet, Rfl: 11 .  gabapentin (NEURONTIN) 600 MG tablet, Take 0.5 tablets (300 mg total) by mouth 2 (two) times daily. (Patient taking differently: Take 600 mg by mouth 3 (three) times daily. ), Disp: 60 tablet, Rfl: 0 .  guaiFENesin (MUCINEX) 600 MG 12 hr tablet, Take 1 tablet (600 mg total) by mouth 2 (two) times daily., Disp: 20 tablet, Rfl: 0 .  ipratropium-albuterol (DUONEB) 0.5-2.5 (3) MG/3ML  SOLN, Inhale 3 mLs into the lungs 3 (three) times daily as needed (respiratory). , Disp: , Rfl:  .  lactulose (CHRONULAC) 10 GM/15ML solution, Take 45 mLs (30 g total) by mouth 2 (two) times daily as needed for mild constipation., Disp: 240 mL, Rfl: 0 .  magnesium oxide (MAG-OX) 400 MG tablet, Take 400 mg by mouth 2 (two) times daily., Disp: , Rfl:  .  meclizine (ANTIVERT) 12.5 MG tablet, Take 25 mg by mouth 3 (three) times daily as needed for dizziness., Disp: , Rfl:  .  metolazone (ZAROXOLYN) 2.5 MG tablet, Take 1 tablet (2.5 mg total) by mouth daily., Disp: 10 tablet, Rfl: 0 .  metoprolol tartrate (LOPRESSOR) 100 MG tablet, Take 1 tablet (100 mg total) by mouth 2 (two) times daily., Disp: 60 tablet, Rfl: 3 .  nicotine (NICODERM CQ - DOSED IN MG/24 HR) 7 mg/24hr patch, Place 1 patch (7 mg total) onto the skin daily., Disp: 28 patch, Rfl: 0 .  nitroGLYCERIN (NITROSTAT) 0.4 MG SL tablet, Place 1 tablet (0.4 mg total) under the tongue every 5 (five) minutes as needed for chest pain., Disp: 30 tablet, Rfl: 12 .  nortriptyline (PAMELOR) 10 MG capsule, Take 10 mg by mouth Nightly., Disp: , Rfl:  .  omeprazole (PRILOSEC) 40 MG capsule, Take 40 mg by mouth 2 (two) times daily., Disp: , Rfl:  .  [START ON 02/19/2019] Oxycodone HCl 10 MG TABS, Take 0.5-1 tablets (5-10 mg total) by mouth 2 (two) times daily as needed for up to  30 days. Must last 30 days. MAX.: 2/day, Disp: 60 tablet, Rfl: 0 .  potassium chloride SA (K-DUR,KLOR-CON) 20 MEQ tablet, Take 1 tablet (20 mEq total) by mouth daily. And additional tablet when taking metolazone, Disp: 45 tablet, Rfl: 5 .  predniSONE (DELTASONE) 10 MG tablet, Take 6 tablets day 1, take 5 tablets day 2, take 4 tablets day 3, take 3 tablets day 4, take 2 tablets day 5, take 1 tablet on day 6, Disp: 21 tablet, Rfl: 0 .  promethazine (PHENERGAN) 12.5 MG tablet, Take 1 tablet (12.5 mg total) by mouth every 6 (six) hours as needed for nausea or vomiting., Disp: 30 tablet, Rfl:  0 .  rivaroxaban (XARELTO) 20 MG TABS tablet, Take 20 mg by mouth every evening. , Disp: , Rfl:  .  tiZANidine (ZANAFLEX) 4 MG tablet, Take 4 mg by mouth at bedtime. , Disp: , Rfl:  .  topiramate (TOPAMAX) 25 MG tablet, Take 25 mg by mouth 2 (two) times daily. , Disp: , Rfl:   Past Medical History: Past Medical History:  Diagnosis Date  . Allergy    seasonal  . Anxiety   . Arthritis    Right Knee  . Asthma   . CHF (congestive heart failure) ([place])   . COPD (chronic obstructive pulmonary disease) ([place])   . Coronary artery disease    Leaky heart valve  . Fibromyalgia   . GERD (gastroesophageal reflux disease)   . Hypertension   . Pneumonia   . PUD (peptic ulcer disease)   . Pulmonary HTN ([place])   . Rheumatic fever/heart disease   . Sleep apnea     Tobacco Use: Social History   Tobacco Use  Smoking Status Current Every Day Smoker  . Packs/day: 0.25  . Types: Cigarettes  Smokeless Tobacco Never Used  Tobacco Comment   01/16/19 still smoking 1-2 per day.  given fake cigarette to help    Labs: Recent Review Flowsheet Data    Labs for ITP Cardiac and Pulmonary Rehab Latest Ref Rng & Units 06/03/2012 12/09/2013 04/03/2014 08/21/2014 12/20/2017   Cholestrol 0 - 200 mg/dL - 91 96 - -   LDLCALC mg/dL - 50 51 - -   HDL 35 - 70 mg/dL - 31(L) 32(A) - -   Trlycerides 40 - 160 mg/dL - 50 63 - -   Hemoglobin A1c - 4.5 - - 5.2 -   PHART 7.350 - 7.450 - - - - 7.39   PCO2ART 32.0 - 48.0 mmHg - - - - 41   HCO3 20.0 - 28.0 mmol/L - - - - 24.8   ACIDBASEDEF 0.0 - 2.0 mmol/L - - - - 0.2   O2SAT % - - - - 98.4       Pulmonary Assessment Scores: Pulmonary Assessment Scores    Row Name 01/16/19 1238         ADL UCSD   ADL Phase  Entry     SOB Score total  77     Rest  2     Walk  2     Stairs  4     Bath  3     Dress  4     Shop  3       CAT Score   CAT Score  24       mMRC Score   mMRC Score  2        Pulmonary Function Assessment: Pulmonary Function Assessment -  01/16/19 1239  Breath   Bilateral Breath Sounds  Clear    Shortness of Breath  Limiting activity;Yes       Exercise Target Goals: Exercise Program Goal: Individual exercise prescription set using results from initial 6 min walk test and THRR while considering  patient's activity barriers and safety.   Exercise Prescription Goal: Initial exercise prescription builds to 30-45 minutes a day of aerobic activity, 2-3 days per week.  Home exercise guidelines will be given to patient during program as part of exercise prescription that the participant will acknowledge.  Activity Barriers & Risk Stratification: Activity Barriers & Cardiac Risk Stratification - 01/16/19 1244      Activity Barriers & Cardiac Risk Stratification   Activity Barriers  Arthritis;Back Problems;Shortness of Breath;Joint Problems;Fibromyalgia;Deconditioning;Muscular Weakness;Decreased Ventricular Function       6 Minute Walk: 6 Minute Walk    Row Name 01/16/19 1438         6 Minute Walk   Phase  Initial     Distance  1067 feet     Walk Time  6 minutes     # of Rest Breaks  0     MPH  2.02     METS  2.7     RPE  15     Perceived Dyspnea   2     VO2 Peak  9.46     Symptoms  Yes (comment)     Comments  back and leg pain 8/10, SOB     Resting HR  90 bpm     Resting BP  124/72     Resting Oxygen Saturation   95 %     Exercise Oxygen Saturation  during 6 min walk  87 %     Max Ex. HR  108 bpm     Max Ex. BP  134/74     2 Minute Post BP  124/70       Interval HR   1 Minute HR  92     2 Minute HR  95     3 Minute HR  93     4 Minute HR  99     5 Minute HR  102     6 Minute HR  108     2 Minute Post HR  98     Interval Heart Rate?  Yes       Interval Oxygen   Interval Oxygen?  Yes     Baseline Oxygen Saturation %  95 %     1 Minute Oxygen Saturation %  92 %     1 Minute Liters of Oxygen  0 L Room Air     2 Minute Oxygen Saturation %  93 %     2 Minute Liters of Oxygen  0 L     3 Minute  Oxygen Saturation %  87 %     3 Minute Liters of Oxygen  0 L     4 Minute Oxygen Saturation %  94 %     4 Minute Liters of Oxygen  0 L     5 Minute Oxygen Saturation %  90 %     5 Minute Liters of Oxygen  0 L     6 Minute Oxygen Saturation %  87 %     6 Minute Liters of Oxygen  0 L     2 Minute Post Oxygen Saturation %  98 %     2 Minute Post Liters of Oxygen  0  L       Oxygen Initial Assessment: Oxygen Initial Assessment - 01/16/19 1239      Home Oxygen   Home Oxygen Device  Portable Concentrator;Home Concentrator    Sleep Oxygen Prescription  Continuous    Liters per minute  3    Home Exercise Oxygen Prescription  None    Home at Rest Exercise Oxygen Prescription  None    Compliance with Home Oxygen Use  Yes      Initial 6 min Walk   Oxygen Used  None      Program Oxygen Prescription   Program Oxygen Prescription  None      Intervention   Short Term Goals  To learn and exhibit compliance with exercise, home and travel O2 prescription;To learn and understand importance of monitoring SPO2 with pulse oximeter and demonstrate accurate use of the pulse oximeter.;To learn and understand importance of maintaining oxygen saturations>88%;To learn and demonstrate proper pursed lip breathing techniques or other breathing techniques.;To learn and demonstrate proper use of respiratory medications    Long  Term Goals  Exhibits compliance with exercise, home and travel O2 prescription;Verbalizes importance of monitoring SPO2 with pulse oximeter and return demonstration;Maintenance of O2 saturations>88%;Exhibits proper breathing techniques, such as pursed lip breathing or other method taught during program session;Compliance with respiratory medication;Demonstrates proper use of MDI's       Oxygen Re-Evaluation: Oxygen Re-Evaluation    Row Name 01/23/19 1158             Program Oxygen Prescription   Program Oxygen Prescription  None         Home Oxygen   Sleep Oxygen Prescription   Continuous       Liters per minute  3       Home Exercise Oxygen Prescription  None       Home at Rest Exercise Oxygen Prescription  None       Compliance with Home Oxygen Use  No         Goals/Expected Outcomes   Short Term Goals  To learn and understand importance of monitoring SPO2 with pulse oximeter and demonstrate accurate use of the pulse oximeter.;To learn and understand importance of maintaining oxygen saturations>88%;To learn and demonstrate proper pursed lip breathing techniques or other breathing techniques.;To learn and demonstrate proper use of respiratory medications       Long  Term Goals  Verbalizes importance of monitoring SPO2 with pulse oximeter and return demonstration;Maintenance of O2 saturations>88%;Exhibits proper breathing techniques, such as pursed lip breathing or other method taught during program session;Compliance with respiratory medication;Demonstrates proper use of MDI's       Comments  Reviewed PLB technique with pt.  Talked about how it work and it's important to maintaining his exercise saturations.         Goals/Expected Outcomes  Short: Become more profiecient at using PLB.   Long: Become independent at using PLB.          Oxygen Discharge (Final Oxygen Re-Evaluation): Oxygen Re-Evaluation - 01/23/19 1158      Program Oxygen Prescription   Program Oxygen Prescription  None      Home Oxygen   Sleep Oxygen Prescription  Continuous    Liters per minute  3    Home Exercise Oxygen Prescription  None    Home at Rest Exercise Oxygen Prescription  None    Compliance with Home Oxygen Use  No      Goals/Expected Outcomes   Short Term Goals  To  learn and understand importance of monitoring SPO2 with pulse oximeter and demonstrate accurate use of the pulse oximeter.;To learn and understand importance of maintaining oxygen saturations>88%;To learn and demonstrate proper pursed lip breathing techniques or other breathing techniques.;To learn and demonstrate  proper use of respiratory medications    Long  Term Goals  Verbalizes importance of monitoring SPO2 with pulse oximeter and return demonstration;Maintenance of O2 saturations>88%;Exhibits proper breathing techniques, such as pursed lip breathing or other method taught during program session;Compliance with respiratory medication;Demonstrates proper use of MDI's    Comments  Reviewed PLB technique with pt.  Talked about how it work and it's important to maintaining his exercise saturations.      Goals/Expected Outcomes  Short: Become more profiecient at using PLB.   Long: Become independent at using PLB.       Initial Exercise Prescription: Initial Exercise Prescription - 01/16/19 1400      Date of Initial Exercise RX and Referring Provider   Date  01/16/19    Referring Provider  Paraschos, Alexander MD      Treadmill   MPH  1.5    Grade  0.5    Minutes  15    METs  2.25      NuStep   Level  2    SPM  80    Minutes  15    METs  2.2      REL-XR   Level  1    Speed  50    Minutes  15    METs  2.2      Prescription Details   Frequency (times per week)  3    Duration  Progress to 45 minutes of aerobic exercise without signs/symptoms of physical distress      Intensity   THRR 40-80% of Max Heartrate  122*153    Ratings of Perceived Exertion  11-13    Perceived Dyspnea  0-4      Progression   Progression  Continue to progress workloads to maintain intensity without signs/symptoms of physical distress.      Resistance Training   Training Prescription  Yes    Weight  3 lbs    Reps  10-15       Perform Capillary Blood Glucose checks as needed.  Exercise Prescription Changes: Exercise Prescription Changes    Row Name 01/16/19 1400 01/24/19 1300           Response to Exercise   Blood Pressure (Admit)  124/72  148/84      Blood Pressure (Exercise)  134/74  120/60      Blood Pressure (Exit)  124/70  100/62      Heart Rate (Admit)  90 bpm  96 bpm      Heart Rate  (Exercise)  108 bpm  105 bpm      Heart Rate (Exit)  98 bpm  100 bpm      Oxygen Saturation (Admit)  95 %  96 %      Oxygen Saturation (Exercise)  87 %  95 %      Oxygen Saturation (Exit)  98 %  95 %      Rating of Perceived Exertion (Exercise)  15  15      Perceived Dyspnea (Exercise)  2  3      Symptoms  SOB, back/leg pain 8/10  none      Comments  walk test results  first full day of exercise      Duration  -  Progress to 45 minutes of aerobic exercise without signs/symptoms of physical distress      Intensity  -  THRR unchanged        Progression   Progression  -  Continue to progress workloads to maintain intensity without signs/symptoms of physical distress.      Average METs  -  2.42        Resistance Training   Training Prescription  -  Yes      Weight  -  3 lbs      Reps  -  10-15        Interval Training   Interval Training  -  No        Treadmill   MPH  -  1.5      Grade  -  0.5      Minutes  -  15      METs  -  2.25        REL-XR   Level  -  1      Minutes  -  15      METs  -  2.6         Exercise Comments: Exercise Comments    Row Name 01/23/19 1201           Exercise Comments  First full day of exercise!  Patient was oriented to gym and equipment including functions, settings, policies, and procedures.  Patient's individual exercise prescription and treatment plan were reviewed.  All starting workloads were established based on the results of the 6 minute walk test done at initial orientation visit.  The plan for exercise progression was also introduced and progression will be customized based on patient's performance and goals.          Exercise Goals and Review: Exercise Goals    Row Name 01/16/19 1443             Exercise Goals   Increase Physical Activity  Yes       Intervention  Provide advice, education, support and counseling about physical activity/exercise needs.;Develop an individualized exercise prescription for aerobic and resistive  training based on initial evaluation findings, risk stratification, comorbidities and participant's personal goals.       Expected Outcomes  Short Term: Attend rehab on a regular basis to increase amount of physical activity.;Long Term: Add in home exercise to make exercise part of routine and to increase amount of physical activity.;Long Term: Exercising regularly at least 3-5 days a week.       Increase Strength and Stamina  Yes       Intervention  Provide advice, education, support and counseling about physical activity/exercise needs.;Develop an individualized exercise prescription for aerobic and resistive training based on initial evaluation findings, risk stratification, comorbidities and participant's personal goals.       Expected Outcomes  Short Term: Increase workloads from initial exercise prescription for resistance, speed, and METs.;Short Term: Perform resistance training exercises routinely during rehab and add in resistance training at home;Long Term: Improve cardiorespiratory fitness, muscular endurance and strength as measured by increased METs and functional capacity (6MWT)       Able to understand and use rate of perceived exertion (RPE) scale  Yes       Intervention  Provide education and explanation on how to use RPE scale       Expected Outcomes  Short Term: Able to use RPE daily in rehab to express subjective intensity level;Long Term:  Able to use  RPE to guide intensity level when exercising independently       Able to understand and use Dyspnea scale  Yes       Intervention  Provide education and explanation on how to use Dyspnea scale       Expected Outcomes  Short Term: Able to use Dyspnea scale daily in rehab to express subjective sense of shortness of breath during exertion;Long Term: Able to use Dyspnea scale to guide intensity level when exercising independently       Knowledge and understanding of Target Heart Rate Range (THRR)  Yes       Intervention  Provide education  and explanation of THRR including how the numbers were predicted and where they are located for reference       Expected Outcomes  Short Term: Able to state/look up THRR;Short Term: Able to use daily as guideline for intensity in rehab;Long Term: Able to use THRR to govern intensity when exercising independently       Able to check pulse independently  Yes       Intervention  Provide education and demonstration on how to check pulse in carotid and radial arteries.;Review the importance of being able to check your own pulse for safety during independent exercise       Expected Outcomes  Short Term: Able to explain why pulse checking is important during independent exercise;Long Term: Able to check pulse independently and accurately       Understanding of Exercise Prescription  Yes       Intervention  Provide education, explanation, and written materials on patient's individual exercise prescription       Expected Outcomes  Short Term: Able to explain program exercise prescription;Long Term: Able to explain home exercise prescription to exercise independently          Exercise Goals Re-Evaluation : Exercise Goals Re-Evaluation    Row Name 01/23/19 1202             Exercise Goal Re-Evaluation   Exercise Goals Review  Increase Physical Activity;Able to understand and use rate of perceived exertion (RPE) scale;Increase Strength and Stamina;Able to understand and use Dyspnea scale       Comments  Reviewed RPE scale, THR and program prescription with pt today.  Pt voiced understanding and was given a copy of goals to take home.        Expected Outcomes  Short: Use RPE daily to regulate intensity. Long: Follow program prescription in THR.          Discharge Exercise Prescription (Final Exercise Prescription Changes): Exercise Prescription Changes - 01/24/19 1300      Response to Exercise   Blood Pressure (Admit)  148/84    Blood Pressure (Exercise)  120/60    Blood Pressure (Exit)  100/62     Heart Rate (Admit)  96 bpm    Heart Rate (Exercise)  105 bpm    Heart Rate (Exit)  100 bpm    Oxygen Saturation (Admit)  96 %    Oxygen Saturation (Exercise)  95 %    Oxygen Saturation (Exit)  95 %    Rating of Perceived Exertion (Exercise)  15    Perceived Dyspnea (Exercise)  3    Symptoms  none    Comments  first full day of exercise    Duration  Progress to 45 minutes of aerobic exercise without signs/symptoms of physical distress    Intensity  THRR unchanged      Progression   Progression  Continue to progress workloads to maintain intensity without signs/symptoms of physical distress.    Average METs  2.42      Resistance Training   Training Prescription  Yes    Weight  3 lbs    Reps  10-15      Interval Training   Interval Training  No      Treadmill   MPH  1.5    Grade  0.5    Minutes  15    METs  2.25      REL-XR   Level  1    Minutes  15    METs  2.6       Nutrition:  Target Goals: Understanding of nutrition guidelines, daily intake of sodium <1523m, cholesterol <2074m calories 30% from fat and 7% or less from saturated fats, daily to have 5 or more servings of fruits and vegetables.  Biometrics: Pre Biometrics - 01/16/19 1444      Pre Biometrics   Height  5' 6.4" (1.687 m)    Weight  273 lb 4.8 oz (124 kg)    Waist Circumference  45 inches    Hip Circumference  52 inches    Waist to Hip Ratio  0.87 %    BMI (Calculated)  43.56    Single Leg Stand  2.79 seconds        Nutrition Therapy Plan and Nutrition Goals: Nutrition Therapy & Goals - 01/16/19 1242      Intervention Plan   Intervention  Prescribe, educate and counsel regarding individualized specific dietary modifications aiming towards targeted core components such as weight, hypertension, lipid management, diabetes, heart failure and other comorbidities.;Nutrition handout(s) given to patient.    Expected Outcomes  Short Term Goal: Understand basic principles of dietary content, such as  calories, fat, sodium, cholesterol and nutrients.;Short Term Goal: A plan has been developed with personal nutrition goals set during dietitian appointment.;Long Term Goal: Adherence to prescribed nutrition plan.       Nutrition Assessments: Nutrition Assessments - 01/16/19 1245      MEDFICTS Scores   Pre Score  30       Nutrition Goals Re-Evaluation:   Nutrition Goals Discharge (Final Nutrition Goals Re-Evaluation):   Psychosocial: Target Goals: Acknowledge presence or absence of significant depression and/or stress, maximize coping skills, provide positive support system. Participant is able to verbalize types and ability to use techniques and skills needed for reducing stress and depression.   Initial Review & Psychosocial Screening: Initial Psych Review & Screening - 01/16/19 1240      Initial Review   Current issues with  Current Sleep Concerns;Current Stress Concerns;Current Depression;Current Psychotropic Meds    Source of Stress Concerns  Transportation;Unable to perform yard/household activities;Financial;Chronic Illness    Comments  Still struggling ot sleep with breathing.  She is taking [employer] bus for transportation. Took course of [employer].  Family is still helping her get around.       Family Dynamics   Good Support System?  Yes    Comments  Support system is mother, aunt, and female friend      Barriers   Psychosocial barriers to participate in program  The patient should benefit from training in stress management and relaxation.;Psychosocial barriers identified (see note)      Screening Interventions   Interventions  Encouraged to exercise;Program counselor consult;To provide support and resources with identified psychosocial needs;Provide feedback about the scores to participant    Expected Outcomes  Short Term goal: Utilizing psychosocial counselor,  staff and physician to assist with identification of specific Stressors or current issues interfering with healing  process. Setting desired goal for each stressor or current issue identified.;Long Term Goal: Stressors or current issues are controlled or eliminated.;Short Term goal: Identification and review with participant of any Quality of Life or Depression concerns found by scoring the questionnaire.;Long Term goal: The participant improves quality of Life and PHQ9 Scores as seen by post scores and/or verbalization of changes       Quality of Life Scores:  Scores of 19 and below usually indicate a poorer quality of life in these areas.  A difference of  2-3 points is a clinically meaningful difference.  A difference of 2-3 points in the total score of the Quality of Life Index has been associated with significant improvement in overall quality of life, self-image, physical symptoms, and general health in studies assessing change in quality of life.  PHQ-9: Recent Review Flowsheet Data    Depression screen [facility] 2/9 01/16/2019 01/05/2019 11/14/2018 08/25/2018 07/26/2018   Decreased Interest 1 0 0 1 0   Down, Depressed, Hopeless 0 0 0 0 0   PHQ - 2 Score 1 0 0 1 0   Altered sleeping 2 - - 2 -   Tired, decreased energy 1 - - 2 -   Change in appetite 2 - - 1 -   Feeling bad or failure about yourself  0 - - 0 -   Trouble concentrating 0 - - 2 -   Moving slowly or fidgety/restless 0 - - 0 -   Suicidal thoughts 0 - - 0 -   PHQ-9 Score 6 - - 8 -   Difficult doing work/chores Not difficult at all - - Not difficult at all -     Interpretation of Total Score  Total Score Depression Severity:  1-4 = Minimal depression, 5-9 = Mild depression, 10-14 = Moderate depression, 15-19 = Moderately severe depression, 20-27 = Severe depression   Psychosocial Evaluation and Intervention: Psychosocial Evaluation - 01/23/19 1222      Psychosocial Evaluation & Interventions   Interventions  Encouraged to exercise with the program and follow exercise prescription;Relaxation education;Stress management education    Comments   Ms. [facility]) transferred from the Cardiac Rehab program to this Pulmonary Rehab program in the last several months.   She continues to have a strong support system with lots of family locally.  She has multiple health issues but COPD and Pulmonary Lung Disease on top of her diabetes seem to concern her the most.  [name] reports sleeping intermittently with possibly 6 hours per night.  She is in the process of getting all her doctors and medical services transferred to this community from [facility] since she needs a new sleep study done.  [name] has a history of depression and anxiety; is being treated for this and is typically in a positive mood.  She reports her health is her biggest stressor and she wants to breathe better and quit smoking while in this program.  Staff will follow.     Expected Outcomes  Short:  [name] will connect with a local PCP (option provided by counselor) to transfer all of her medical providers locally since she has no transportation.  She hopes to get a new sleep study done soon as a result.  Long:  [name] will quit smoking for her health and learn healthy coping strategies including exercise.      Continue Psychosocial Services   Follow up  required by staff       Psychosocial Re-Evaluation:   Psychosocial Discharge (Final Psychosocial Re-Evaluation):   Education: Education Goals: Education classes will be provided on a weekly basis, covering required topics. Participant will state understanding/return demonstration of topics presented.  Learning Barriers/Preferences: Learning Barriers/Preferences - 01/16/19 1242      Learning Barriers/Preferences   Learning Barriers  None    Learning Preferences  Individual Instruction       Education Topics:  Initial Evaluation Education: - Verbal, written and demonstration of respiratory meds, oximetry and breathing techniques. Instruction on use of nebulizers and MDIs and importance of monitoring MDI activations.   General  Nutrition Guidelines/Fats and Fiber: -Group instruction provided by verbal, written material, models and posters to present the general guidelines for heart healthy nutrition. Gives an explanation and review of dietary fats and fiber.   Controlling Sodium/Reading Food Labels: -Group verbal and written material supporting the discussion of sodium use in heart healthy nutrition. Review and explanation with models, verbal and written materials for utilization of the food label.   Exercise Physiology & General Exercise Guidelines: - Group verbal and written instruction with models to review the exercise physiology of the cardiovascular system and associated critical values. Provides general exercise guidelines with specific guidelines to those with heart or lung disease.    Aerobic Exercise & Resistance Training: - Gives group verbal and written instruction on the various components of exercise. Focuses on aerobic and resistive training programs and the benefits of this training and how to safely progress through these programs.   Flexibility, Balance, Mind/Body Relaxation: Provides group verbal/written instruction on the benefits of flexibility and balance training, including mind/body exercise modes such as yoga, pilates and tai chi.  Demonstration and skill practice provided.   Stress and Anxiety: - Provides group verbal and written instruction about the health risks of elevated stress and causes of high stress.  Discuss the correlation between heart/lung disease and anxiety and treatment options. Review healthy ways to manage with stress and anxiety.   Pulmonary Rehab from 02/01/2019 in [facility] Cardiac and Pulmonary Rehab  Date  09/13/18  Educator  [facility]  Instruction Review Code  1- Verbalizes Understanding      Depression: - Provides group verbal and written instruction on the correlation between heart/lung disease and depressed mood, treatment options, and the stigmas associated with seeking  treatment.   Pulmonary Rehab from 02/01/2019 in [facility] Cardiac and Pulmonary Rehab  Date  02/01/19  Educator  [facility]  Instruction Review Code  1- Verbalizes Understanding      Exercise & Equipment Safety: - Individual verbal instruction and demonstration of equipment use and safety with use of the equipment.   Pulmonary Rehab from 02/01/2019 in [facility] Cardiac and Pulmonary Rehab  Date  08/25/18  Educator  [facility]  Instruction Review Code  1- Verbalizes Understanding      Infection Prevention: - Provides verbal and written material to individual with discussion of infection control including proper hand washing and proper equipment cleaning during exercise session.   Pulmonary Rehab from 02/01/2019 in [facility] Cardiac and Pulmonary Rehab  Date  08/25/18  Educator  [facility]  Instruction Review Code  1- Verbalizes Understanding      Falls Prevention: - Provides verbal and written material to individual with discussion of falls prevention and safety.   Pulmonary Rehab from 02/01/2019 in [facility] Cardiac and Pulmonary Rehab  Date  08/25/18  Educator  [facility]  Instruction Review Code  1- Verbalizes Understanding  Diabetes: - Individual verbal and written instruction to review signs/symptoms of diabetes, desired ranges of glucose level fasting, after meals and with exercise. Advice that pre and post exercise glucose checks will be done for 3 sessions at entry of program.   Chronic Lung Diseases: - Group verbal and written instruction to review updates, respiratory medications, advancements in procedures and treatments. Discuss use of supplemental oxygen including available portable oxygen systems, continuous and intermittent flow rates, concentrators, personal use and safety guidelines. Review proper use of inhaler and spacers. Provide informative websites for self-education.    Energy Conservation: - Provide group verbal and written instruction for methods to conserve energy, plan and organize activities. Instruct  on pacing techniques, use of adaptive equipment and posture/positioning to relieve shortness of breath.   Triggers and Exacerbations: - Group verbal and written instruction to review types of environmental triggers and ways to prevent exacerbations. Discuss weather changes, air quality and the benefits of nasal washing. Review warning signs and symptoms to help prevent infections. Discuss techniques for effective airway clearance, coughing, and vibrations.   AED/CPR: - Group verbal and written instruction with the use of models to demonstrate the basic use of the AED with the basic ABC's of resuscitation.   Anatomy and Physiology of the Lungs: - Group verbal and written instruction with the use of models to provide basic lung anatomy and physiology related to function, structure and complications of lung disease.   Anatomy & Physiology of the Heart: - Group verbal and written instruction and models provide basic cardiac anatomy and physiology, with the coronary electrical and arterial systems. Review of Valvular disease and Heart Failure   Cardiac Medications: - Group verbal and written instruction to review commonly prescribed medications for heart disease. Reviews the medication, class of the drug, and side effects.   Know Your Numbers and Risk Factors: -Group verbal and written instruction about important numbers in your health.  Discussion of what are risk factors and how they play a role in the disease process.  Review of Cholesterol, Blood Pressure, Diabetes, and BMI and the role they play in your overall health.   Pulmonary Rehab from 02/01/2019 in [facility] Cardiac and Pulmonary Rehab  Date  09/08/18  Educator  [facility]  Instruction Review Code  1- Verbalizes Understanding      Sleep Hygiene: -Provides group verbal and written instruction about how sleep can affect your health.  Define sleep hygiene, discuss sleep cycles and impact of sleep habits. Review good sleep hygiene tips.     Pulmonary Rehab from 02/01/2019 in [facility] Cardiac and Pulmonary Rehab  Date  10/11/18  Educator  [facility]  Instruction Review Code  1- Verbalizes Understanding      Other: -Provides group and verbal instruction on various topics (see comments)    Knowledge Questionnaire Score: Knowledge Questionnaire Score - 01/16/19 1242      Knowledge Questionnaire Score   Pre Score  15/18   reviewed with pt today       Core Components/Risk Factors/Patient Goals at Admission: Personal Goals and Risk Factors at Admission - 01/16/19 1243      Core Components/Risk Factors/Patient Goals on Admission    Weight Management  Yes;Obesity;Weight Loss    Intervention  Weight Management: Develop a combined nutrition and exercise program designed to reach desired caloric intake, while maintaining appropriate intake of nutrient and fiber, sodium and fats, and appropriate energy expenditure required for the weight goal.;Weight Management: Provide education and appropriate resources to help participant work on and  attain dietary goals.;Weight Management/Obesity: Establish reasonable short term and long term weight goals.;Obesity: Provide education and appropriate resources to help participant work on and attain dietary goals.    Admit Weight  273 lb 4.8 oz (124 kg)    Goal Weight: Short Term  268 lb (121.6 kg)    Goal Weight: Long Term  220 lb (99.8 kg)    Expected Outcomes  Short Term: Continue to assess and modify interventions until short term weight is achieved;Long Term: Adherence to nutrition and physical activity/exercise program aimed toward attainment of established weight goal;Weight Loss: Understanding of general recommendations for a balanced deficit meal plan, which promotes 1-2 lb weight loss per week and includes a negative energy balance of [phone] kcal/d;Understanding recommendations for meals to include 15-35% energy as protein, 25-35% energy from fat, 35-60% energy from carbohydrates, less than 257m of  dietary cholesterol, 20-35 gm of total fiber daily;Understanding of distribution of calorie intake throughout the day with the consumption of 4-5 meals/snacks    Tobacco Cessation  Yes    Number of packs per day  1-2 cigarettes a day    Intervention  Assist the participant in steps to quit. Provide individualized education and counseling about committing to Tobacco Cessation, relapse prevention, and pharmacological support that can be provided by physician.;OAdvice worker assist with locating and accessing local/national Quit Smoking programs, and support quit date choice.   Quit Date: 02/14/19   Expected Outcomes  Short Term: Will demonstrate readiness to quit, by selecting a quit date.;Short Term: Will quit all tobacco product use, adhering to prevention of relapse plan.;Long Term: Complete abstinence from all tobacco products for at least 12 months from quit date.    Improve shortness of breath with ADL's  Yes    Intervention  Provide education, individualized exercise plan and daily activity instruction to help decrease symptoms of SOB with activities of daily living.    Expected Outcomes  Short Term: Improve cardiorespiratory fitness to achieve a reduction of symptoms when performing ADLs;Long Term: Be able to perform more ADLs without symptoms or delay the onset of symptoms    Heart Failure  Yes    Intervention  Provide a combined exercise and nutrition program that is supplemented with education, support and counseling about heart failure. Directed toward relieving symptoms such as shortness of breath, decreased exercise tolerance, and extremity edema.    Expected Outcomes  Improve functional capacity of life;Short term: Attendance in program 2-3 days a week with increased exercise capacity. Reported lower sodium intake. Reported increased fruit and vegetable intake. Reports medication compliance.;Short term: Daily weights obtained and reported for increase. Utilizing diuretic  protocols set by physician.;Long term: Adoption of self-care skills and reduction of barriers for early signs and symptoms recognition and intervention leading to self-care maintenance.    Hypertension  Yes    Intervention  Provide education on lifestyle modifcations including regular physical activity/exercise, weight management, moderate sodium restriction and increased consumption of fresh fruit, vegetables, and low fat dairy, alcohol moderation, and smoking cessation.;Monitor prescription use compliance.    Expected Outcomes  Short Term: Continued assessment and intervention until BP is < 140/938mHG in hypertensive participants. < 130/8058mG in hypertensive participants with diabetes, heart failure or chronic kidney disease.;Long Term: Maintenance of blood pressure at goal levels.       Core Components/Risk Factors/Patient Goals Review:    Core Components/Risk Factors/Patient Goals at Discharge (Final Review):    ITP Comments: ITP Comments    Row Name 12/20/18 1340  01/16/19 1437 02/06/19 0817       ITP Comments  Called to check on [name].  She has been out since 11/12.  She continues to have on going issues and still needs clearance to return.  We will discharge her at this time with the hope that once her health recovers she will be able to return to finish the program.   Medical evaluation and 6MWT completed today.  Documentation for diaganosis can be found in [place] encounter 01/03/19 with Dr. Saralyn Pilar.  Initial ITP created and sent for review to [name], Medical Director.   30 day review completed. ITP sent to [name] Director of [place]. Continue with ITP unless changes are made by physician.        Comments: 30 day review

## 2019-02-08 ENCOUNTER — Encounter: Payer: Self-pay | Admitting: Pain Medicine

## 2019-02-08 ENCOUNTER — Other Ambulatory Visit: Payer: Self-pay

## 2019-02-08 ENCOUNTER — Encounter: Payer: Self-pay | Admitting: *Deleted

## 2019-02-08 ENCOUNTER — Ambulatory Visit: Payer: Medicaid Other | Attending: Pain Medicine | Admitting: Pain Medicine

## 2019-02-08 ENCOUNTER — Encounter: Payer: Medicaid Other | Admitting: *Deleted

## 2019-02-08 VITALS — BP 112/86 | HR 87 | Temp 97.6°F | Ht 64.0 in | Wt 271.0 lb

## 2019-02-08 DIAGNOSIS — R0789 Other chest pain: Secondary | ICD-10-CM | POA: Diagnosis present

## 2019-02-08 DIAGNOSIS — Z79899 Other long term (current) drug therapy: Secondary | ICD-10-CM | POA: Insufficient documentation

## 2019-02-08 DIAGNOSIS — E790 Hyperuricemia without signs of inflammatory arthritis and tophaceous disease: Secondary | ICD-10-CM | POA: Diagnosis present

## 2019-02-08 DIAGNOSIS — M533 Sacrococcygeal disorders, not elsewhere classified: Secondary | ICD-10-CM | POA: Diagnosis present

## 2019-02-08 DIAGNOSIS — M25552 Pain in left hip: Secondary | ICD-10-CM | POA: Diagnosis present

## 2019-02-08 DIAGNOSIS — Z6841 Body Mass Index (BMI) 40.0 and over, adult: Secondary | ICD-10-CM | POA: Diagnosis present

## 2019-02-08 DIAGNOSIS — M170 Bilateral primary osteoarthritis of knee: Secondary | ICD-10-CM | POA: Insufficient documentation

## 2019-02-08 DIAGNOSIS — G894 Chronic pain syndrome: Secondary | ICD-10-CM | POA: Diagnosis present

## 2019-02-08 DIAGNOSIS — R937 Abnormal findings on diagnostic imaging of other parts of musculoskeletal system: Secondary | ICD-10-CM | POA: Insufficient documentation

## 2019-02-08 DIAGNOSIS — M47816 Spondylosis without myelopathy or radiculopathy, lumbar region: Secondary | ICD-10-CM | POA: Diagnosis present

## 2019-02-08 DIAGNOSIS — M1A09X0 Idiopathic chronic gout, multiple sites, without tophus (tophi): Secondary | ICD-10-CM | POA: Diagnosis present

## 2019-02-08 DIAGNOSIS — D1779 Benign lipomatous neoplasm of other sites: Secondary | ICD-10-CM | POA: Insufficient documentation

## 2019-02-08 DIAGNOSIS — M25561 Pain in right knee: Secondary | ICD-10-CM | POA: Diagnosis present

## 2019-02-08 DIAGNOSIS — M79605 Pain in left leg: Secondary | ICD-10-CM | POA: Diagnosis not present

## 2019-02-08 DIAGNOSIS — Z96651 Presence of right artificial knee joint: Secondary | ICD-10-CM | POA: Insufficient documentation

## 2019-02-08 DIAGNOSIS — G8929 Other chronic pain: Secondary | ICD-10-CM | POA: Insufficient documentation

## 2019-02-08 DIAGNOSIS — I5032 Chronic diastolic (congestive) heart failure: Secondary | ICD-10-CM

## 2019-02-08 DIAGNOSIS — M47818 Spondylosis without myelopathy or radiculopathy, sacral and sacrococcygeal region: Secondary | ICD-10-CM | POA: Insufficient documentation

## 2019-02-08 DIAGNOSIS — M153 Secondary multiple arthritis: Secondary | ICD-10-CM | POA: Diagnosis present

## 2019-02-08 DIAGNOSIS — M7918 Myalgia, other site: Secondary | ICD-10-CM

## 2019-02-08 DIAGNOSIS — M5442 Lumbago with sciatica, left side: Secondary | ICD-10-CM | POA: Diagnosis present

## 2019-02-08 DIAGNOSIS — R269 Unspecified abnormalities of gait and mobility: Secondary | ICD-10-CM | POA: Diagnosis not present

## 2019-02-08 DIAGNOSIS — M461 Sacroiliitis, not elsewhere classified: Secondary | ICD-10-CM

## 2019-02-08 DIAGNOSIS — R739 Hyperglycemia, unspecified: Secondary | ICD-10-CM | POA: Insufficient documentation

## 2019-02-08 MED ORDER — ALLOPURINOL 100 MG PO TABS: 100.0000 mg | ORAL_TABLET | Freq: Every day | ORAL | 0 refills | Status: DC

## 2019-02-08 MED ORDER — COLCHICINE 0.6 MG PO TABS: 0.6000 mg | ORAL_TABLET | Freq: Every day | ORAL | 0 refills | Status: DC

## 2019-02-08 NOTE — Progress Notes (Signed)
Daily Session Note  Patient Details  Name: Chelsea Ramsey MRN: [id number] Date of Birth: Jul 30, 1966 Referring Provider:     Pulmonary Rehab from 01/16/2019 in [facility] Cardiac and Pulmonary Rehab  Referring Provider  Isaias Cowman MD      Encounter Date: 02/08/2019  Check In: Session Check In - 02/08/19 1122      Check-In   Supervising physician immediately available to respond to emergencies  [facility] immediately available ER MD    Physician(s)  Drs. Glena Norfolk    Location  [facility]-Cardiac & Pulmonary Rehab    Staff Present  Renita Papa, RN BSN;Dennard Vezina Luan [place], [place], RCEP, CCRP, Exercise Physiologist;Joseph Tessie Fass RCP,RRT,BSRT    Medication changes reported      No    Fall or balance concerns reported     No    Warm-up and Cool-down  Performed as group-led instruction    Resistance Training Performed  Yes    VAD Patient?  No    PAD/SET Patient?  No          Social History   Tobacco Use  Smoking Status Current Every Day Smoker  . Packs/day: 0.10  . Types: Cigarettes  Smokeless Tobacco Never Used  Tobacco Comment   02/08/19 Down to 1 cigarette per day.  quit date is 02/14/19    Goals Met:  Proper associated with RPD/PD & O2 Sat Independence with exercise equipment Using PLB without cueing & demonstrates good technique Exercise tolerated well Personal goals reviewed No report of cardiac concerns or symptoms Strength training completed today  Goals Unmet:  Not Applicable  Comments: Pt able to follow exercise prescription today without complaint.  Will continue to monitor for progression.    [name] is Medical Director for [place] and [facility].

## 2019-02-08 NOTE — Patient Instructions (Addendum)
____________________________________________________________________________________________  Gout    Mechanism: Uric acid accumulation.    Uric Acid: Uric acid is a heterocyclic compound of carbon, nitrogen, oxygen, and hydrogen with the formula [id number]. It forms ions and salts known as urates and acid urates such as ammonium acid urate. Uric acid is a product of the metabolic breakdown of purine nucleotides. High blood concentrations of uric acid can lead to gout. The chemical is associated with other medical conditions including diabetes and the formation of ammonium acid urate kidney stones.    Purines: Purines are found in high concentration in meat and meat products, especially internal organs such as liver and kidney. In general, plant-based diets are low in purines. Examples of high-purine sources include: sweetbreads, anchovies, sardines, liver, beef kidneys, brains, meat extracts (e.g., Oxo, Bovril), herring, mackerel, scallops, game meats, beer (from the yeast) and gravy. A moderate amount of purine is also contained in beef, pork, poultry, other fish and seafood, asparagus, cauliflower, spinach, mushrooms, green peas, lentils, dried peas, beans, oatmeal, wheat bran, wheat germ, and hawthorn. Higher levels of meat and seafood consumption are associated with an increased risk of gout, whereas a higher level of consumption of dairy products is associated with a decreased risk. Moderate intake of purine-rich vegetables or protein is not associated with an increased risk of gout.    Causes: Uric acid is generated as the body's tissues are broken down during normal cell turnover. Some people with gout generate too much uric acid (10%). Other patients with gout do not effectively eliminate their uric acid into the urine (90%). Genetics, gender,and nutrition (alcoholism, obesity) play key roles in the development of gout.   1. If your parents have gout, then you have a 20% chance of developing it.    2. [place] people are 5 times more likely to develop gout. 3. American blacks, but not African blacks, are more likely to have gout than other populations. 4. Use of alcohol, especially beer, increases the risk for gout.   5. Diets rich in red meats, internal organs, yeast, and oily fish increase the risk for gout.   6. Uric acid levels increase at puberty in men and at menopause in women, so men first develop gout at an earlier age (30s to 50s) than do women (50s to 70s). Gout in pre-menopausal women is distinctly unusual.   7. Attacks of gouty arthritis?can be precipitated when there is a sudden change in uric acid levels.   8. Overindulgence of alcohol and red meats   9. Trauma   10. Starvation and dehydration 11. IV contrast dyes   12. Chemotherapy     Some Possible Causes of Elevated Uric Acid Levels  Medication Diuretics used for weight loss or heart disease, insulin, some antibiotics, medication for rheumatoid arthritis, or an overdose of B vitamins can cause uric acid levels to rise. Diuretics reduce sodium, magnesium, calcium and potassium (among other things) levels. If you need to use a diuretic, see our natural herbal products for ones with fewer side effects. One customer reported getting gout when he took beta-blockers for his high blood pressure.     Poor kidney function When kidneys are not functioning at optimum levels, they lose their ability to excrete uric acid from the body. This situation may be due to various kidney problems or over-consumption of alcohol. When alcohol is metabolized, lactic acid is produced, which hinders uric acid excretion by the kidneys.     Dieting Severe dieting or fasting can cause excess lactic   acid, which hinders uric acid excretion by the kidneys. Crash and severe calorie restriction diets shock your metabolism and can trigger a gout attack. Dieting may also cause a loss of potassium, which can increase urate levels in the blood. As mentioned  above, some dieters also use diuretics to speed the process, and they can rob the body of potassium and other minerals, triggering a gout attack. It seems to be a vicious circle! However, a proper diet that is done slowly is recommended because losing weight will reduce serum levels of uric acid.    Diet Traditional thinking tells [place] that gout is the result of excessive amounts of alcohol, protein, heavy foods, coffee and soft drinks in your diet. Certain foods contain high levels of purine which can cause uric acid levels to rise. Purine is a protein substance that is transformed into uric acid during digestion. Reduction in consumption of these foods is very often successful in reducing or eliminating gout.     A potassium deficiency can increase urate levels in the blood. This is very important, and ways to correct it?are discussed above and under the diuretics section.    Drugs that increase serum uric acid  1. Aspirin (Low dose)  2. Diuretics  3. hypertensive medications   4. Nicotinic acid   5. Cyclosporine A   6. Acetaminophen (Tylenol)  7. Others     Pharmacological treatment:  1) Colchicine (PO)  Adverse Side Effects: nausea, vomiting, diarrhea  MAX: 6 mg/day during an acute attack  Goal: keep serum urate < 7.0 mg/dl Prophylactic Dose: 0.6 -1.2 mg/day  2) Probenecid (Uricosuric properties)  Mechanism of Action: increases uric acid excretion  Adverse Side Effects: overt nephrolithiasis (Kidney stones). Side effects of probenecid are uncommon and usually mild. In addition to causing kidney stones and precipitating acute gouty arthritis, side effects of probenecid include hair loss, skin rash, headache, nausea, sore gums, and fever. In rare instances, it has caused severe anemias  To Avoid Stones:  start at low doses  Stay well hydrated  Alkalinize urine  1) Sodium Bicarbonate  2) And/or Acetazolamide (Carbonic anhydrate inhibitor) Starting Dose: 250 mg bid and increase over  several weeks  3) Allopurinol  Mechanism of Action: Decreases uric acid production  Adverse Side Effects: fever, dermatitis, elevated liver enzymes, diarrhea, and vasculitis. The most frequent adverse reaction to allopurinol is skin rash. Allopurinol should be discontinued immediately at the first appearance of rash, painful urination, blood in the urine, eye irritation, or swelling of the mouth or lips, because these can be a signs of impending severe allergic reaction, which can be fatal. Rarely, allopurinol can cause nerve, kidney, and bone marrow damage.  Dose: 300 mg/day   Treatment  1. Drink 2 to 3 L of fluid daily.  2. Consume a moderate amount of protein. Limit meat, fish and poultry to 4 - 6 oz per day. Try other low-purine good protein foods such as low fat dairy products, tofu and eggs.  3. Limit fat intake by choosing leaner meats, foods prepared with less oils and lower fat dairy products  4. Aside from avoiding high purine foods, maintaining a healthy body weight is important for gout patients as well. Obesity can result in increased uric acid production by the body. Follow a well-balanced diet to lose excess body weight. Do not follow a high-protein low-carb diet as this can worsen gout conditions.  5. Keep the urine pH high (basic or non-acidic)  6. Colchicine, probenecid, allopurinol, sodium bicarbonate.  Prevention  If you are at risk for gout, you should   1. Eat a low-cholesterol, low-fat diet. People with gout have a higher risk for heart disease. This diet would not only lower your risk for gout but also your risk for heart disease.   2. Slowly lose weight. This can lower your uric acid levels. Losing weight too rapidly can occasionally precipitate gout attacks.   3. Restrict your?intake of alcohol, especially beer.   If you have had an attack of gouty arthritis, you should do all of the above and follow the regimen prescribed by your physician. The adequate prevention of  gouty arthritis may involve lifelong medical therapy.    Balanced Diet  According to the American Medical Association, a balanced diet for people with gout include foods:  1. High in complex carbohydrates (whole grains, fruits, vegetables)   2. Low in protein (15% of calories and sources should be soy, lean meats, poultry)   3. No more than 30% of calories from fat (10% animal fat)    Beneficial Foods  Foods which may be beneficial to people with gout include:  1. Dark berries may contain chemicals that lower uric acid and reduce inflammation.   2. Tofu which is made from soybeans may be a better choice than meats.   3. Certain fatty acids found in certain fish such as salmon, flax or olive oil, or nuts may possess some anti-inflammatory benefits.    Recommended Foods to Eat  1. Fresh cherries, strawberries, blueberries, and other red-blue berries   2. Bananas   3. Celery   4. Tomatoes   5. Vegetables including kale, cabbage, parsley, green-leafy vegetables   6. Foods high in bromelain (pineapple)   7. Foods high in vitamin C (red cabbage, red bell peppers, tangerines, mandarins, oranges, potatoes)   8. Drink fruit juices and purified water (8 glasses of water per day)   9. Low-fat dairy products   10. Complex carbohydrates (breads, cereals, pasta, rice, as well as aforementioned vegetables and fruits)   11. Chocolate, cocoa   12. Coffee, tea   13. Carbonated beverages   14. Essential fatty acids (tuna and salmon, flaxseed, nuts, seeds)   15. Tofu, although a legume and made from soybeans, may be a better choice than meat    Foods to Avoid  Diets which are high in purines and high in protein have long been suspected of causing an increased risk of gout .    According to the American Medical Association, purine-containing foods include:  1. Beer, other alcoholic beverages. Limit alcohol consumption to 1 drink 3 times a week.  2. Anchovies, sardines in oil, fish roes, herring,  Mackerel, Scallops, mussels  3. Yeast. (Beer), whole grain breads and cereals, oatmeal  4. Organ meat (liver, kidneys, brains, sweetbreads)   5. Processed meats (hot dogs, lunch meats, etc.),  6. Legumes (dried beans, peas, lima beans)   7. Meat extracts, consomm, broth, bouillon, gravies. (e.g Oxo, Bovril)  8. Mushrooms, spinach, asparagus, cauliflower, mushrooms.  9. Chicken, duck, ham, [place], Game meats   10. Fried foods, roasted nuts, any food cooked in oil (heated oil destroys vitamin E)  11. Rich foods (cakes, sugar products, white flour products)  12. Dried fruits  13. Caffeine  14. Eggs    NOTE: It is important to remember that purines are found in all protein foods. All sources of purines should not be eliminated.    Urine at pH 7.0 is neutral and elimination of  uric acid decreases by approximately 50% at pH 6.5. The pka of uric acid is 5.75.  In urine at pH 5.0, only 15% of uric acid exists in solution. The solubility increases more than 10-fold at pH 7.0 and more than 100-fold at pH 8.0.    Extremely Alkaline Forming Foods - pH 8.5 to 9.0:  Lemons, Watermelon , Agar Agar , Cantaloupe, Cayenne (Capsicum), Dried dates & figs, Kelp, [place], [place]zu root, Limes, [place], Melons, Papaya, [place], [place] grapes (sweet), Watercress, Seaweed    Moderate Alkaline - pH 7.5 to 8.0  Apples (sweet), Apricots, Alfalfa sprouts Arrowroot, Avocados, Bananas (ripe), Berries, Carrots, Celery, Currants, Dates & figs (fresh), Garlic , Gooseberry, Grapes (less sweet), Grapefruit, Guavas, Herbs (leafy green), Lettuce (leafy green), Nectarine, Peaches (sweet), Pears (less sweet), Peas (fresh sweet), Persimmon, Pumpkin (sweet), Sea salt , Spinach, Apples (sour), Bamboo shoots, Beans (fresh green), Beets, Bell Pepper, Broccoli, Cabbage, Cauliflower, Carob , Daikon, Ginger (fresh), Grapes (sour), Kale, Kohlrabi, Lettuce (pale green), Oranges, Parsnip, Peaches (less sweet), Peas (less sweet), Potatoes & skin,  Pumpkin (less sweet), Raspberry, Sapote, Strawberry, Squash , Sweet corn (fresh), Tamari , Turnip, Sour Dairy    Slightly Alkaline to Neutral pH 7.0  Almonds , Artichokes ([place]), Barley-Malt (sweetener-[place]), [employer] Rice Syrup, [employer], Cherries, Coconut (fresh), Cucumbers, [employer] plant, Honey (raw), Leeks, Miso, Okra, Olives ripe , [place], [place] (home made with brown rice vinegar), Radish, Sea salt , Spices , Taro, Tomatoes (sweet), Vinegar (sweet brown rice), Water Chestnut, Amaranth, Artichoke (globe), Chestnuts (dry roasted), Egg yolks (soft cooked), Goat's milk and whey (raw) , Horseradish, Mayonnaise (home made), Millet, Olive oil, Quinoa, Rhubarb, Sesame seeds (whole) , Soy beans (dry), Sprouted grains , Tempeh, Tofu, Tomatoes (less sweet)    Slightly Acid to Neutral pH 7.0  Barley malt syrup, Barley, Bran, Cashews, Cereals (unrefined with honey-fruit-maple syrup), Cornmeal, Fructose, Honey (pasteurized), Lentils, Macadamias, Maple syrup (unprocessed),Low Fat Milk (homogenized) and most processed dairy products, Molasses organic , Nutmeg, Mustard, Pistachios, Popcorn (plain), Rice or wheat crackers (unrefined), Rye (grain), Rye bread (organic sprouted), Seeds (pumpkin & sunflower), Walnuts Blueberries, [place] nuts, Butter (salted), Cheeses (mild & crumbly) , Crackers (unrefined rye), Dried beans (mung, adzuki, pinto, kidney, garbanzo) , Dry coconut, Egg whites, Goats milk (homogenized), Olives (pickled), Pecans, Plums , Prunes , Butter (fresh unsalted), Cream (fresh & raw), Milk (raw cow's) , Whey (cow's)     ACID FORMING FOODS FATS & OILS  Avocado Oil, Canola Oil, Corn Oil, Hemp Seed Oil, Flax Oil, Lard, Olive Oil, Safflower Oil, Sesame Oil, Sunflower Oil    FRUITS  Cranberries    GRAINS  Rice Cakes, Wheat Cakes, Amaranth, Barley, Buckwheat, Oats (rolled), Quinoa, Rice, Rye, Spelt, Kamut, Wheat, Hemp Seed Flour    NUTS & BUTTERS  Cashews, [place] Nuts, Peanuts, Processed Peanut  Butter, Pecans, Tahini    ANIMAL PROTEIN  Beef, Carp, Clams, Fish, [place], [place], Mussels, [place], [employer], Rabbit, [place], Shrimp, [place], [place], [place], Therapist, nutritional    PASTA (WHITE)  Noodles, Macaroni, Spaghetti Distilled Vinegar, Wheat Germ    BEANS & LEGUMES  Black Beans, Chick Peas, Green Peas, Kidney Beans, Lentils, [place] Beans, Pinto Beans, Red Beans, Soy Beans, Soy Milk, White Beans, Rice Milk, Almond Milk    DRUGS & CHEMICALS  Aspartame, Chemicals, Drugs (Medicinal), Drugs (Psychedelic), Pesticides, Herbicides    ALCOHOL  Beer, Spirits, Hard Liquor, Wine    ACTIVITIES  Overwork, Anger, Fear, Jealousy, Stress    Moderate Acid - pH 6.0 to 6.5  Cigarette tobacco, Cream of Wheat (  unrefined), Fish, Fruit juices with sugar, Maple syrup (processed), Molasses (sulphured), Pickles (commercial), Breads (refined) of corn, oats, rice & rye, Cereals (refined), corn flakes, Shellfish, Wheat germ, Whole Wheat foods , Wine , Yogurt (sweetened) Bananas (green), Buckwheat, Cheeses (sharp), Corn & rice breads, Egg whole (cooked hard), Ketchup, Mayonnaise, Oats, Pasta (whole grain), Pastry (wholegrain & honey), Peanuts, Potatoes (with no skins), Popcorn (with salt & butter), Rice (basmati), Rice (brown), Soy sauce (commercial), Tapioca, Wheat bread (sprouted organic)    Extremely Acid Forming Foods - pH 5.0 to 5.5  Artificial sweeteners, Beef, Carbonated soft drinks & fizzy drinks , Cigarettes (tailor made), Drugs, Flour (white wheat), Goat, Lamb, Pastries & cakes from white flour, Pork, Sugar (white) , Beer , Brown sugar , Chicken, [place], Chocolate, Coffee , Custard with white sugar, Jams, Jellies, Liquor , Pasta (white), Rabbit, [place], Table salt refined & iodized, Tea black, [place], Wheat bread, White rice, White vinegar (processed).    Research Update:   A recent study published in the [place] of Medicine on Feb 08, 2003 revealed that high intake of low-fat dairy products indeed reduces  the risk of gout by 50%. It is unknown why low-fat dairy products offer a protective effect.   Unfortunately, no natural supplements are proven effective to prevent or alleviate onset of acute gout attacks. The most effective treatment for gout attack is medication.   ____________________________________________________________________________________________   ____________________________________________________________________________________________  Medication Rules  Purpose: To inform patients, and their family members, of our rules and regulations.  Applies to: All patients receiving prescriptions (written or electronic).  Pharmacy of record: Pharmacy where electronic prescriptions will be sent. If written prescriptions are taken to a different pharmacy, please inform the nursing staff. The pharmacy listed in the electronic medical record should be the one where you would like electronic prescriptions to be sent.  Electronic prescriptions: In compliance with the [place] (STOP) Act of 2017 (Session Lanny Cramp [phone]), effective November 30, 2018, all controlled substances must be electronically prescribed. Calling prescriptions to the pharmacy will cease to exist.  Prescription refills: Only during scheduled appointments. Applies to all prescriptions.  NOTE: The following applies primarily to controlled substances (Opioid* Pain Medications).   Patient's responsibilities: 1. Pain Pills: Bring all pain pills to every appointment (except for procedure appointments). 2. Pill Bottles: Bring pills in original pharmacy bottle. Always bring the newest bottle. Bring bottle, even if empty. 3. Medication refills: You are responsible for knowing and keeping track of what medications you take and those you need refilled. The day before your appointment: write a list of all prescriptions that need to be refilled. The day of the appointment: give the list to  the admitting nurse. Prescriptions will be written only during appointments. No prescriptions will be written on procedure days. If you forget a medication: it will not be "Called in", "Faxed", or "electronically sent". You will need to get another appointment to get these prescribed. No early refills. Do not call asking to have your prescription filled early. 4. Prescription Accuracy: You are responsible for carefully inspecting your prescriptions before leaving our office. Have the discharge nurse carefully go over each prescription with you, before taking them home. Make sure that your name is accurately spelled, that your address is correct. Check the name and dose of your medication to make sure it is accurate. Check the number of pills, and the written instructions to make sure they are clear and accurate. Make sure that you are given  enough medication to last until your next medication refill appointment. 5. Taking Medication: Take medication as prescribed. When it comes to controlled substances, taking less pills or less frequently than prescribed is permitted and encouraged. Never take more pills than instructed. Never take medication more frequently than prescribed.  6. Inform other Doctors: Always inform, all of your healthcare providers, of all the medications you take. 7. Pain Medication from other Providers: You are not allowed to accept any additional pain medication from any other Doctor or Healthcare provider. There are two exceptions to this rule. (see below) In the event that you require additional pain medication, you are responsible for notifying us, as stated below. 8. Medication Agreement: You are responsible for carefully reading and following our Medication Agreement. This must be signed before receiving any prescriptions from our practice. Safely store a copy of your signed Agreement. Violations to the Agreement will result in no further prescriptions. (Additional copies of our  Medication Agreement are available upon request.) 9. Laws, Rules, & Regulations: All patients are expected to follow all Federal and [employer], [employer], Rules, [employer]. Ignorance of the Laws does not constitute a valid excuse. The use of any illegal substances is prohibited. 10. Adopted CDC guidelines & recommendations: Target dosing levels will be at or below 60 MME/day. Use of benzodiazepines** is not recommended.  Exceptions: There are only two exceptions to the rule of not receiving pain medications from other Healthcare Providers. 1. Exception #1 (Emergencies): In the event of an emergency (i.e.: accident requiring emergency care), you are allowed to receive additional pain medication. However, you are responsible for: As soon as you are able, call our office [phone], at any time of the day or night, and leave a message stating your name, the date and nature of the emergency, and the name and dose of the medication prescribed. In the event that your call is answered by a member of our staff, make sure to document and save the date, time, and the name of the person that took your information.  2. Exception #2 (Planned Surgery): In the event that you are scheduled by another doctor or dentist to have any type of surgery or procedure, you are allowed (for a period no longer than 30 days), to receive additional pain medication, for the acute post-op pain. However, in this case, you are responsible for picking up a copy of our "Post-op Pain Management for Surgeons" handout, and giving it to your surgeon or dentist. This document is available at our office, and does not require an appointment to obtain it. Simply go to our office during business hours (Monday-Thursday from 8:00 AM to 4:00 PM) (Friday 8:00 AM to 12:00 Noon) or if you have a scheduled appointment with [place], prior to your surgery, and ask for it by name. In addition, you will need to provide [place] with your name, name of your surgeon,  type of surgery, and date of procedure or surgery.  *Opioid medications include: morphine, codeine, oxycodone, oxymorphone, hydrocodone, hydromorphone, meperidine, tramadol, tapentadol, buprenorphine, fentanyl, methadone. **Benzodiazepine medications include: diazepam (Valium), alprazolam (Xanax), clonazepam (Klonopine), lorazepam (Ativan), clorazepate (Tranxene), chlordiazepoxide (Librium), estazolam (Prosom), oxazepam (Serax), temazepam (Restoril), triazolam (Halcion) (Last updated: 01/27/2018) ____________________________________________________________________________________________   ____________________________________________________________________________________________  Medication Recommendations and Reminders  Applies to: All patients receiving prescriptions (written and/or electronic).  Medication Rules & Regulations: These rules and regulations exist for your safety and that of others. They are not flexible and neither are we. Dismissing or ignoring them will be  considered "non-compliance" with medication therapy, resulting in complete and irreversible termination of such therapy. (See document titled "Medication Rules" for more details.) In all conscience, because of safety reasons, we cannot continue providing a therapy where the patient does not follow instructions.  Pharmacy of record:   Definition: This is the pharmacy where your electronic prescriptions will be sent.   We do not endorse any particular pharmacy.  You are not restricted in your choice of pharmacy.  The pharmacy listed in the electronic medical record should be the one where you want electronic prescriptions to be sent.  If you choose to change pharmacy, simply notify our nursing staff of your choice of new pharmacy.  Recommendations:  Keep all of your pain medications in a safe place, under lock and key, even if you live alone.   After you fill your prescription, take 1 week's worth of pills and put  them away in a safe place. You should keep a separate, properly labeled bottle for this purpose. The remainder should be kept in the original bottle. Use this as your primary supply, until it runs out. Once it's gone, then you know that you have 1 week's worth of medicine, and it is time to come in for a prescription refill. If you do this correctly, it is unlikely that you will ever run out of medicine.  To make sure that the above recommendation works, it is very important that you make sure your medication refill appointments are scheduled at least 1 week before you run out of medicine. To do this in an effective manner, make sure that you do not leave the office without scheduling your next medication management appointment. Always ask the nursing staff to show you in your prescription , when your medication will be running out. Then arrange for the receptionist to get you a return appointment, at least 7 days before you run out of medicine. Do not wait until you have 1 or 2 pills left, to come in. This is very poor planning and does not take into consideration that we may need to cancel appointments due to bad weather, sickness, or emergencies affecting our staff.  "Partial Fill": If for any reason your pharmacy does not have enough pills/tablets to completely fill or refill your prescription, do not allow for a "partial fill". You will need a separate prescription to fill the remaining amount, which we will not provide. If the reason for the partial fill is your insurance, you will need to talk to the pharmacist about payment alternatives for the remaining tablets, but again, do not accept a partial fill.  Prescription refills and/or changes in medication(s):   Prescription refills, and/or changes in dose or medication, will be conducted only during scheduled medication management appointments. (Applies to both, written and electronic prescriptions.)  No refills on procedure days. No medication will  be changed or started on procedure days. No changes, adjustments, and/or refills will be conducted on a procedure day. Doing so will interfere with the diagnostic portion of the procedure.  No phone refills. No medications will be "called into the pharmacy".  No Fax refills.  No weekend refills.  No Holliday refills.  No after hours refills.  Remember:  Business hours are:  Monday to Thursday 8:00 AM to 4:00 PM Provider's Schedule: Dionisio David, NP - Appointments are:  Medication management: Monday to Thursday 8:00 AM to 4:00 PM Milinda Pointer, MD - Appointments are:  Medication management: Monday and Wednesday 8:00 AM to 4:00  PM Procedure day: Tuesday and Thursday 7:30 AM to 4:00 PM Gillis Santa, MD - Appointments are:  Medication management: Tuesday and Thursday 8:00 AM to 4:00 PM Procedure day: Monday and Wednesday 7:30 AM to 4:00 PM (Last update: 01/27/2018) ____________________________________________________________________________________________   ____________________________________________________________________________________________  CANNABIDIOL (AKA: CBD Oil or Pills)  Applies to: All patients receiving prescriptions of controlled substances (written and/or electronic).  General Information: Cannabidiol (CBD) was discovered in 30. It is one of some 113 identified cannabinoids in cannabis (Marijuana) plants, accounting for up to 40% of the plant's extract. As of 2018, preliminary clinical research on cannabidiol included studies of anxiety, cognition, movement disorders, and pain.  Cannabidiol is consummed in multiple ways, including inhalation of cannabis smoke or vapor, as an aerosol spray into the cheek, and by mouth. It may be supplied as CBD oil containing CBD as the active ingredient (no added tetrahydrocannabinol (THC) or terpenes), a full-plant CBD-dominant hemp extract oil, capsules, dried cannabis, or as a liquid solution. CBD is thought not have the  same psychoactivity as THC, and may affect the actions of THC. Studies suggest that CBD may interact with different biological targets, including cannabinoid receptors and other neurotransmitter receptors. As of 2018 the mechanism of action for its biological effects has not been determined.  In the [place], cannabidiol has a limited approval by the Food and Drug Administration (FDA) for treatment of only two types of epilepsy disorders. The side effects of long-term use of the drug include somnolence, decreased appetite, diarrhea, fatigue, malaise, weakness, sleeping problems, and others.  CBD remains a Schedule I drug prohibited for any use.  Legality: Some manufacturers ship CBD products nationally, an illegal action which the FDA has not enforced in 2018, with CBD remaining the subject of an FDA investigational new drug evaluation, and is not considered legal as a dietary supplement or food ingredient as of December 2018. Federal illegality has made it difficult historically to conduct research on CBD. CBD is openly sold in head shops and health food stores in some states where such sales have not been explicitly legalized.  Warning: Because it is not FDA approved for general use or treatment of pain, it is not required to undergo the same manufacturing controls as prescription drugs.  This means that the available cannabidiol (CBD) may be contaminated with THC.  If this is the case, it will trigger a positive urine drug screen (UDS) test for cannabinoids (Marijuana).  Because a positive UDS for illicit substances is a violation of our medication agreement, your opioid analgesics (pain medicine) may be permanently discontinued. (Last update: 02/17/2018) ____________________________________________________________________________________________    ______________________________________________________________________________________________  Weight Management Required  URGENT: Your weight  has been found to be adversely affecting your health.  Dear Ms. Eutsler:  Your current Body mass index is 46.52 kg/m.Marland Kitchen Estimated body mass index is 46.52 kg/m as calculated from the following:   Height as of this encounter: 5\' 4"  (1.626 m).   Weight as of this encounter: 271 lb (122.9 kg).  Your last four (4) weight and BMI calculations are as follows: Wt Readings from Last 4 Encounters:  02/08/19 271 lb (122.9 kg)  01/24/19 271 lb (122.9 kg)  01/18/19 273 lb (123.8 kg)  01/16/19 273 lb 4.8 oz (124 kg)   BMI Readings from Last 4 Encounters:  02/08/19 46.52 kg/m  01/24/19 45.38 kg/m  01/18/19 46.86 kg/m  01/16/19 43.58 kg/m    Calculations estimate your ideal body weight to be: Ideal body weight: 54.7 kg (120  lb 9.5 oz) Adjusted ideal body weight: 82 kg (180 lb 12.1 oz)  Please use the table below to identify your weight category and associated incidence of chronic pain, secondary to your weight.  BMI interpretation table: BMI level Category Associated incidence of chronic pain  <18 kg/m2 Underweight   18.5-24.9 kg/m2 Ideal body weight   25-29.9 kg/m2 Overweight  20%  30-34.9 kg/m2 Obese (Class I)  68%  35-39.9 kg/m2 Severe obesity (Class II)  136%  >40 kg/m2 Extreme obesity (Class III)  254%   In addition: You will be considered "Morbidly Obese", if your BMI is above 30 and you have one or more of the following conditions which are known to be directly associated with obesity: 1.    Type 2 Diabetes (Which in turn can lead to cardiovascular diseases (CVD), stroke, peripheral vascular diseases (PVD), retinopathy, nephropathy, and neuropathy) 2.    Cardiovascular Disease (High Blood Pressure; Congestive Heart Failure; High Cholesterol; Coronary Artery Disease; Angina; or History of Heart Attacks) 3.    Breathing problems (Asthma; obesity-hypoventilation syndrome; obstructive sleep apnea; chronic inflammatory airway disease; reactive airway disease; or shortness of  breath) 4.    Chronic kidney disease 5.    Liver disease (nonalcoholic fatty liver disease) 6.    High blood pressure 7.    Acid reflux (gastroesophageal reflux disease; heartburn) 8.    Osteoarthritis (OA) (with any of the following: hip pain; knee pain; and/or low back pain) 9.    Low back pain (Lumbar Facet Syndrome; and/or Degenerative Disc Disease) 10.  Hip pain (Osteoarthritis of hip) (For every 1 lbs of added body weight, there is a 2 lbs increase in pressure inside of each hip articulation. 1:2 mechanical relationship) 11.  Knee pain (Osteoarthritis of knee) (For every 1 lbs of added body weight, there is a 4 lbs increase in pressure inside of each knee articulation. 1:4 mechanical relationship) (patients with a BMI>30 kg/m2 were 6.8 times more likely to develop knee OA than normal-weight individuals) 12.  Cancer. Epidemiological studies have shown that obesity is a risk factor for: post-menopausal breast cancer; cancers of the endometrium, colon and kidney cancer; malignant adenomas of the oesophagus. Obese subjects have an approximately 1.5-3.5-fold increased risk of developing these cancers compared with normal-weight subjects, and it has been estimated that between 15 and 45% of these cancers can be attributed to overweight. More recent studies suggest that obesity may also increase the risk of other types of cancer, including pancreatic, hepatic and gallbladder cancer. (Ref: Obesity and cancer. Pischon T, Nthlings U, [employer] H. Proc Nutr Soc. 2008 May;67(2):128-45. doi: 96.7893/Y1017510258527782.)  Recommendation: At this point it is urgent that you take a step back and concentrate in loosing weight. Dedicate 100% of your efforts on this task. Nothing else will improve your health more than bringing down your BMI to less than 30. Because most chronic pain patients do have difficulty exercising secondary to their pain, you must rely on proper nutrition and dieting in order to lose the weight.  If your BMI is above 40, you should seriously consider bariatric surgery. A realistic goal is to lose 10% of your body weight over a period of 12 months.  If over time you have unsuccessfully try to lose weight, then it is time for you to seek professional help and to enter a medically supervised weight management program.  Pain management considerations:  1.    Pharmacological Problems: Be advised that the use of opioid analgesics (oxycodone; hydrocodone; morphine; methadone; codeine;  and all of their derivatives) have been associated with decreased metabolism and weight gain.  For this reason, should we see that you are unable to lose weight while taking these medications, it may become necessary for [place] to taper down and indefinitely discontinue them.  2.    Technical Problems: The incidence of successful interventional therapies decreases as the patient's BMI increases. It is much more difficult to accomplish a safe and effective interventional therapy on a patient with a BMI above 35. Yours is Body mass index is 46.52 kg/m.Marland Kitchen  3.    Radiation Exposure Problems: The x-rays machine, used to accomplish injection therapies, will automatically increase their x-ray output in order to capture an appropriate bone image. This means that radiation exposure increases exponentially with the patient's BMI. (The higher the BMI, the higher the radiation exposure.) Although the level of radiation used at a given time is still safe to the patient, it is not for the physician and/or assisting staff. Unfortunately, radiation exposure is accumulative. Because physicians and the staff have to do procedures and be exposed on a daily basis, this can result in health problems such as cancer and radiation burns. Radiation exposure to the staff is monitored by the radiation batches that they wear. The exposure levels are reported back to the staff on a quarterly basis. Depending on levels of exposure, physicians and staff may be  obligated by law to decrease this exposure. This means that they have the right and obligation to refuse providing therapies where they may be overexposed to radiation. For this reason, physicians may decline to offer therapies such as radiofrequency ablation or implants to patients with a BMI above 40. 4.    Current Trends: Be advised that the current trend is to no longer offer certain therapies to patients with a BMI equal to, or above 35, due to increase perioperative risks, increased technical procedural difficulties, and excessive radiation exposure to healthcare personnel.  ______________________________________________________________________________________________

## 2019-02-13 LAB — TOXASSURE SELECT 13 (MW), URINE

## 2019-02-14 ENCOUNTER — Ambulatory Visit: Payer: Medicaid Other | Admitting: Family

## 2019-02-20 DIAGNOSIS — I5032 Chronic diastolic (congestive) heart failure: Secondary | ICD-10-CM

## 2019-02-22 ENCOUNTER — Telehealth: Payer: Self-pay

## 2019-02-22 NOTE — Telephone Encounter (Signed)
   TELEPHONE CALL NOTE  This patient has been deemed a candidate for follow-up tele-health visit to limit community exposure during the Covid-19 pandemic. I spoke with the patient via phone to discuss instructions. This has been outlined on the patient's AVS (dotphrase: [email or web address]). The patient was advised to review the section on consent for treatment as well. The patient will receive a phone call 2-3 days prior to their E-Visit at which time consent will be verbally confirmed. A Virtual Office Visit appointment type has been scheduled for 02/23/2019 @ 10:40 with Darylene Price FNP.  Venus Ruhe L, CMA 02/22/2019 11:13 AM

## 2019-02-22 NOTE — Telephone Encounter (Signed)
TELEPHONE CALL NOTE  Chelsea Ramsey has been deemed a candidate for a follow-up tele-health visit to limit community exposure during the Covid-19 pandemic. I spoke with the patient via phone to ensure availability of phone/video source, confirm preferred email & phone number, discuss instructions and expectations, and review consent.   I reminded Chelsea Ramsey to be prepared with any vital sign and/or heart rhythm information that could potentially be obtained via home monitoring, at the time of her visit.  Finally, I reminded Chelsea Ramsey to expect an e-mail containing a link for their video-based visit approximately 15 minutes before her visit, or alternatively, a phone call at the time of her visit if her visit is planned to be a phone encounter.  Did the patient verbally consent to treatment as below? Yes  ,  L, CMA 02/22/2019 11:03 AM  DOWNLOADING THE SOFTWARE (If applicable)  Download the [employer] Web[employer] app to enable video and telephone visits with your Heart Failure Provider.   Instructions for downloading [employer]: - Go to https://www.webex.com/downloads.[email or web address] and follow the instructions - If you have technical difficulties with downloading WebEx, please call WebEx at [phone]. - Once the app is downloaded (can be done on either mobile or desktop computer), go to Settings in the upper left hand corner.  Be sure that camera and audio are enabled.  - You will receive an email message with a link to the meeting with a time to join for your tele-health visit.  - Please download the app and have settings configured prior to the appointment time.    CONSENT FOR TELE-HEALTH VISIT - PLEASE REVIEW  I hereby voluntarily request, consent and authorize [facility] and its employed or contracted physicians, physician assistants, nurse practitioners or other licensed health care professionals (the Practitioner), to provide me with  telemedicine health care services (the "Services") as deemed necessary by the treating Practitioner. I acknowledge and consent to receive the Services by the Practitioner via telemedicine. I understand that the telemedicine visit will involve communicating with the Practitioner through live audiovisual communication technology and the disclosure of certain medical information by electronic transmission. I acknowledge that I have been given the opportunity to request an in-person assessment or other available alternative prior to the telemedicine visit and am voluntarily participating in the telemedicine visit.  I understand that I have the right to withhold or withdraw my consent to the use of telemedicine in the course of my care at any time, without affecting my right to future care or treatment, and that the Practitioner or I may terminate the telemedicine visit at any time. I understand that I have the right to inspect all information obtained and/or recorded in the course of the telemedicine visit and may receive copies of available information for a reasonable fee.  I understand that some of the potential risks of receiving the Services via telemedicine include:  Marland Kitchen Delay or interruption in medical evaluation due to technological equipment failure or disruption; . Information transmitted may not be sufficient (e.g. poor resolution of images) to allow for appropriate medical decision making by the Practitioner; and/or  . In rare instances, security protocols could fail, causing a breach of personal health information.  Furthermore, I acknowledge that it is my responsibility to provide information about my medical history, conditions and care that is complete and accurate to the best of my ability. I acknowledge that Practitioner's advice, recommendations, and/or decision may be based on factors not within their control,  such as incomplete or inaccurate data provided by me or distortions of diagnostic  images or specimens that may result from electronic transmissions. I understand that the practice of medicine is not an exact science and that Practitioner makes no warranties or guarantees regarding treatment outcomes. I acknowledge that I will receive a copy of this consent concurrently upon execution via email to the email address I last provided but may also request a printed copy by calling the office of [facility].    I understand that my insurance will be billed for this visit.   I have read or had this consent read to me. . I understand the contents of this consent, which adequately explains the benefits and risks of the Services being provided via telemedicine.  . I have been provided ample opportunity to ask questions regarding this consent and the Services and have had my questions answered to my satisfaction. . I give my informed consent for the services to be provided through the use of telemedicine in my medical care  By participating in this telemedicine visit I agree to the above.

## 2019-02-23 ENCOUNTER — Encounter: Payer: Self-pay | Admitting: Family

## 2019-02-23 ENCOUNTER — Other Ambulatory Visit: Payer: Self-pay

## 2019-02-23 ENCOUNTER — Ambulatory Visit: Payer: Medicaid Other | Attending: Family | Admitting: Family

## 2019-02-23 VITALS — BP 115/73 | HR 93 | Wt 273.0 lb

## 2019-02-23 DIAGNOSIS — I5032 Chronic diastolic (congestive) heart failure: Secondary | ICD-10-CM

## 2019-02-23 DIAGNOSIS — I10 Essential (primary) hypertension: Secondary | ICD-10-CM

## 2019-02-23 MED ORDER — PROMETHAZINE HCL 12.5 MG PO TABS: 12.5000 mg | ORAL_TABLET | Freq: Four times a day (QID) | ORAL | 1 refills | Status: DC | PRN

## 2019-02-23 NOTE — Patient Instructions (Signed)
Continue weighing daily and call for an overnight weight gain of > 2 pounds or a weekly weight gain of >5 pounds.  Next visit is planned to still be a virtual visit via the telephone.

## 2019-02-23 NOTE — Progress Notes (Signed)
Virtual Visit via Telephone Note     Evaluation Performed:  Follow-up visit  This visit type was conducted due to national recommendations for restrictions regarding the COVID-19 Pandemic (e.g. social distancing).  This format is felt to be most appropriate for this patient at this time.  All issues noted in this document were discussed and addressed.  No physical exam was performed (except for noted visual exam findings with Video Visits).  Please refer to the patient's chart (MyChart message for video visits and phone note for telephone visits) for the patient's consent to telehealth for [facility]  Date:  02/23/2019   ID:  Chelsea Ramsey, DOB 12-Oct-1966, MRN [id number]  Patient Location:  [address] [place] [address]   Provider location:   [facility] [place] [address], [place] [address]  PCP:  Kathee Delton, MD  Cardiologist:  Isaias Cowman, MD Electrophysiologist:  None   Chief Complaint:  Follow-up visit  History of Present Illness:    Chelsea Ramsey is a 53 y.o. female who presents via audio/video conferencing for a follow-up telehealth visit today.    The patient does not have symptoms concerning for COVID-19 infection (fever, chills, cough, or new SHORTNESS OF BREATH).   She has minimal shortness of breath upon moderate exertion. She says that this has been present for many years and she feels like it's improving. She has associated fatigue, head congestion, runny nose, chronic back pain, dizziness and minimal abdominal distention. She denies any weight gain, difficulty sleeping, fever, sore throat, cough, wheezing, chest pain or palpitations. Continues to wear her oxygen at 3L at bedtime and PRN during the day. Has been exercising at home via video through her exercise class.   Prior CV studies:   The following studies were reviewed today:  Echo from 11/05/18 reviewed and showed an EF of  50-55% along with mild/moderate AR, mild MR and moderate TR.    Past Medical History:  Diagnosis Date  . Acute drug-induced gout of left foot 03/01/2018   Last Assessment & Plan:  Likely at least partially brought on by diuresis.  Needs to continue to diurese  Will push hydration Stop allopurinol given initiation during acute flare may worsen this, re-broach this when asymptomatic Avoid nsaids given stomach pain Trial colchicine Add acetaminophen Ice, elevate, rest  . Allergy    seasonal  . Anxiety   . Arthritis    Right Knee  . Asthma   . CHF (congestive heart failure) ([place])   . COPD (chronic obstructive pulmonary disease) ([place])   . Coronary artery disease    Leaky heart valve  . Fibromyalgia   . GERD (gastroesophageal reflux disease)   . Hypertension   . Pneumonia   . PUD (peptic ulcer disease)   . Pulmonary HTN ([place])   . Rheumatic fever/heart disease   . Sleep apnea    Past Surgical History:  Procedure Laterality Date  . ADENOIDECTOMY    . ESOPHAGOGASTRODUODENOSCOPY (EGD) WITH PROPOFOL N/A 05/25/2018   Procedure: ESOPHAGOGASTRODUODENOSCOPY (EGD) WITH PROPOFOL;  Surgeon: Lucilla Lame, MD;  Location[facility] ENDOSCOPY;  Service: Endoscopy;  Laterality: N/A;  . MITRAL VALVE REPLACEMENT    . MULTIPLE TOOTH EXTRACTIONS    . TONSILLECTOMY    . TOTAL KNEE ARTHROPLASTY Right 09/04/2016   Procedure: TOTAL KNEE ARTHROPLASTY; with lateral release;  Surgeon: Earlie Server, MD;  Location: [place];  Service: Orthopedics;  Laterality: Right;      Allergies:  Amiodarone; Aspirin; Flexeril [cyclobenzaprine]; Trazamine [trazodone & diet manage prod]; Codeine; and Tramadol   Social History   Tobacco Use  . Smoking status: Current Every Day Smoker    Packs/day: 0.10    Types: Cigarettes  . Smokeless tobacco: Never Used  . Tobacco comment: 02/08/19 Down to 1 cigarette per day.  quit date is 02/14/19  Substance Use Topics  . Alcohol use: Not Currently    Alcohol/week: 3.0 standard drinks     Types: 3 Cans of beer per week    Comment: 16 oz per week  . Drug use: Not Currently    Comment: Previous use of cocaine and marijuana last use 07/31/16     Prior to Admission medications   Medication Sig Start Date End Date Taking? Authorizing Provider  budesonide-formoterol (SYMBICORT) 160-4.5 MCG/ACT inhaler Inhale 2 puffs into the lungs 2 (two) times daily. 02/26/16  Yes [provider]  butalbital-acetaminophen-caffeine (FIORICET, ESGIC) 50-325-40 MG tablet Take 1 tablet by mouth every 6 (six) hours as needed.    Yes [provider]  Calcium Carbonate-Vit D-Min (GNP CALCIUM 1200) 1200-1000 MG-UNIT CHEW Chew 1,200 mg by mouth daily with breakfast. Take in combination with vitamin D and magnesium. 11/14/18 05/13/19 Yes Milinda Pointer, MD  Cholecalciferol (VITAMIN D3) 125 MCG (5000 UT) CAPS Take 1 capsule (5,000 Units total) by mouth daily with breakfast. Take along with calcium and magnesium. 11/14/18 05/13/19 Yes Milinda Pointer, MD  furosemide (LASIX) 40 MG tablet Take 1 tablet (40 mg total) by mouth 2 (two) times daily. 12/06/18 12/06/19 Yes Vaughan Basta, MD  gabapentin (NEURONTIN) 600 MG tablet Take 0.5 tablets (300 mg total) by mouth 2 (two) times daily. Patient taking differently: Take 600 mg by mouth 3 (three) times daily.  12/06/18  Yes Vaughan Basta, MD  guaiFENesin (MUCINEX) 600 MG 12 hr tablet Take 1 tablet (600 mg total) by mouth 2 (two) times daily. 11/19/18  Yes Salary, Avel Peace, MD  lactulose (CHRONULAC) 10 GM/15ML solution Take 45 mLs (30 g total) by mouth 2 (two) times daily as needed for mild constipation. 07/21/18  Yes Epifanio Lesches, MD  magnesium oxide (MAG-OX) 400 MG tablet Take 400 mg by mouth daily.  07/11/18 07/11/19 Yes [provider]  meclizine (ANTIVERT) 12.5 MG tablet Take 25 mg by mouth 3 (three) times daily as needed for dizziness.   Yes [provider]  metolazone (ZAROXOLYN) 2.5 MG tablet Take 1 tablet (2.5  mg total) by mouth daily. Patient taking differently: Take 2.5 mg by mouth 2 (two) times a week.  12/14/18 03/14/19 Yes Darylene Price A, FNP  metoprolol tartrate (LOPRESSOR) 100 MG tablet Take 1 tablet (100 mg total) by mouth 2 (two) times daily. 12/21/18  Yes Breyona Swander, Otila Kluver A, FNP  nitroGLYCERIN (NITROSTAT) 0.4 MG SL tablet Place 1 tablet (0.4 mg total) under the tongue every 5 (five) minutes as needed for chest pain. 11/05/18  Yes Mody, Ulice Bold, MD  nortriptyline (PAMELOR) 10 MG capsule Take 30 mg by mouth at bedtime.   Yes [provider]  omeprazole (PRILOSEC) 40 MG capsule Take 40 mg by mouth 2 (two) times daily.   Yes [provider]  Oxycodone HCl 10 MG TABS Take 0.5-1 tablets (5-10 mg total) by mouth 2 (two) times daily as needed for up to 30 days. Must last 30 days. MAX.: 2/day 02/19/19 03/21/19 Yes Milinda Pointer, MD  potassium chloride SA (K-DUR,KLOR-CON) 20 MEQ tablet Take 1 tablet (20 mEq total) by mouth daily. And additional tablet when  taking metolazone 12/12/18  Yes Hosteen Kienast, Aura Fey, FNP  promethazine (PHENERGAN) 12.5 MG tablet Take 1 tablet (12.5 mg total) by mouth every 6 (six) hours as needed for nausea or vomiting. 11/06/18  Yes Mody, Ulice Bold, MD  rivaroxaban (XARELTO) 20 MG TABS tablet Take 20 mg by mouth every evening.  07/11/18 07/11/19 Yes [provider]  tiZANidine (ZANAFLEX) 4 MG tablet Take 4 mg by mouth at bedtime.  08/10/18  Yes [provider]  allopurinol (ZYLOPRIM) 100 MG tablet Take 1 tablet (100 mg total) by mouth daily for 30 days. 02/08/19 03/10/19  Milinda Pointer, MD  ipratropium-albuterol (DUONEB) 0.5-2.5 (3) MG/3ML SOLN Inhale 3 mLs into the lungs 3 (three) times daily as needed (respiratory).  02/26/16 12/20/18  [provider]    Family Hx: The patient's family history includes Asthma in her mother; COPD in her mother; Cancer in her mother; Congestive Heart Failure in her father.  ROS:   Please see the history of present  illness.     All other systems reviewed and are negative.   Labs/Other Tests and Data Reviewed:    Recent Labs: 04/23/2018: TSH 3.234 12/01/2018: ALT 20; B Natriuretic Peptide 259.0 12/03/2018: Magnesium 2.1 12/27/2018: BUN 12; Creatinine, Ser 0.59; Hemoglobin 10.2; Platelets 323; Potassium 3.7; Sodium 136    Wt Readings from Last 3 Encounters:  02/23/19 273 lb (123.8 kg)  02/08/19 271 lb (122.9 kg)  01/24/19 271 lb (122.9 kg)     Exam:    Vital Signs:  BP 115/73 Comment: self reported BP  Pulse 93 Comment: self-reported home reading  Wt 273 lb (123.8 kg) Comment: home reported weight  LMP 11/08/2009 Comment: menopause  SpO2 95% Comment: self reported home reading  BMI 46.86 kg/m    Well nourished, well developed female in no acute distress.   ASSESSMENT & PLAN:    1.  Chronic heart failure with preserved ejection fraction- - NYHA class II - euvolemic today - weighing daily and home weight ranges from 270-273 lbs. Reminded to call for an overnight weight gain of >2 pounds or a weekly weight gain of >5 pounds - does have metolazone that she takes twice weekly and she does take an extra potassium tablet when she takes it - not adding salt and has been trying to eat low sodium. Did eat canned spinach with added butter yesterday and she said that it tasted very salty and she thinks it was because of the butter. Says that the can said it was low- sodium. Reviewed the importance of closely reading food labels to keep daily sodium content to 2000mg  / day - saw cardiology ([place]) 01/03/2019 - PharmD spoke with patient via the phone regarding her allopurinol   2: HTN- - self- reported BP at home today was 115/73 - sees PCP Margretta Sidle) 03/09/2019 for a new patient visit - BMP from 01/16/2019 reviewed and showed sodium 139, potassium 3.5, creatinine 0.6 and GFR 128  COVID-19 Education: The signs and symptoms of COVID-19 were discussed with the patient and how to seek care for testing  (follow up with PCP or arrange E-visit).  The importance of social distancing was discussed today.  Patient Risk:   After full review of this patients clinical status, I feel that they are at least moderate risk at this time.  Time:   Today, I have spent 18 minutes with the patient with telehealth technology discussing heart failure, medications, daily weights and when to call the office.     Medication Adjustments/Labs  and Tests Ordered: Current medicines are reviewed at length with the patient today.  Concerns regarding medicines are outlined above.   Tests Ordered: No orders of the defined types were placed in this encounter.  Medication Changes: No orders of the defined types were placed in this encounter.   Disposition:  Follow-up in 6 weeks or sooner for any questions/problems before then.   Signed, Alisa Graff, FNP  02/23/2019 11:06 AM    [facility]

## 2019-03-06 ENCOUNTER — Encounter: Payer: Self-pay | Admitting: *Deleted

## 2019-03-06 DIAGNOSIS — I5032 Chronic diastolic (congestive) heart failure: Secondary | ICD-10-CM

## 2019-03-06 NOTE — Progress Notes (Signed)
Pulmonary Individual Treatment Plan  Patient Details  Name: Chelsea Ramsey MRN: [id number] Date of Birth: 1966/05/15 Referring Provider:     Pulmonary Rehab from 01/16/2019 in [facility]  Referring Provider  Isaias Cowman MD      Initial Encounter Date:    Pulmonary Rehab from 01/16/2019 in [facility]  Date  01/16/19      Visit Diagnosis: Heart failure, diastolic, chronic ([place])  Patient's Home Medications on Admission:  Current Outpatient Medications:  .  allopurinol (ZYLOPRIM) 100 MG tablet, Take 1 tablet (100 mg total) by mouth daily for 30 days., Disp: 30 tablet, Rfl: 0 .  budesonide-formoterol (SYMBICORT) 160-4.5 MCG/ACT inhaler, Inhale 2 puffs into the lungs 2 (two) times daily., Disp: , Rfl:  .  butalbital-acetaminophen-caffeine (FIORICET, ESGIC) 50-325-40 MG tablet, Take 1 tablet by mouth every 6 (six) hours as needed. , Disp: , Rfl:  .  Calcium Carbonate-Vit D-Min (GNP CALCIUM 1200) 1200-1000 MG-UNIT CHEW, Chew 1,200 mg by mouth daily with breakfast. Take in combination with vitamin D and magnesium., Disp: 30 tablet, Rfl: 5 .  Cholecalciferol (VITAMIN D3) 125 MCG (5000 UT) CAPS, Take 1 capsule (5,000 Units total) by mouth daily with breakfast. Take along with calcium and magnesium., Disp: 30 capsule, Rfl: 5 .  furosemide (LASIX) 40 MG tablet, Take 1 tablet (40 mg total) by mouth 2 (two) times daily., Disp: 60 tablet, Rfl: 11 .  gabapentin (NEURONTIN) 600 MG tablet, Take 0.5 tablets (300 mg total) by mouth 2 (two) times daily. (Patient taking differently: Take 600 mg by mouth 3 (three) times daily. ), Disp: 60 tablet, Rfl: 0 .  guaiFENesin (MUCINEX) 600 MG 12 hr tablet, Take 1 tablet (600 mg total) by mouth 2 (two) times daily., Disp: 20 tablet, Rfl: 0 .  ipratropium-albuterol (DUONEB) 0.5-2.5 (3) MG/3ML SOLN, Inhale 3 mLs into the lungs 3 (three) times daily as needed (respiratory). , Disp: , Rfl:  .  lactulose  (CHRONULAC) 10 GM/15ML solution, Take 45 mLs (30 g total) by mouth 2 (two) times daily as needed for mild constipation., Disp: 240 mL, Rfl: 0 .  magnesium oxide (MAG-OX) 400 MG tablet, Take 400 mg by mouth daily. , Disp: , Rfl:  .  meclizine (ANTIVERT) 12.5 MG tablet, Take 25 mg by mouth 3 (three) times daily as needed for dizziness., Disp: , Rfl:  .  metolazone (ZAROXOLYN) 2.5 MG tablet, Take 1 tablet (2.5 mg total) by mouth daily. (Patient taking differently: Take 2.5 mg by mouth 2 (two) times a week. ), Disp: 10 tablet, Rfl: 0 .  metoprolol tartrate (LOPRESSOR) 100 MG tablet, Take 1 tablet (100 mg total) by mouth 2 (two) times daily., Disp: 60 tablet, Rfl: 3 .  nitroGLYCERIN (NITROSTAT) 0.4 MG SL tablet, Place 1 tablet (0.4 mg total) under the tongue every 5 (five) minutes as needed for chest pain., Disp: 30 tablet, Rfl: 12 .  nortriptyline (PAMELOR) 10 MG capsule, Take 30 mg by mouth at bedtime., Disp: , Rfl:  .  omeprazole (PRILOSEC) 40 MG capsule, Take 40 mg by mouth 2 (two) times daily., Disp: , Rfl:  .  Oxycodone HCl 10 MG TABS, Take 0.5-1 tablets (5-10 mg total) by mouth 2 (two) times daily as needed for up to 30 days. Must last 30 days. MAX.: 2/day, Disp: 60 tablet, Rfl: 0 .  potassium chloride SA (K-DUR,KLOR-CON) 20 MEQ tablet, Take 1 tablet (20 mEq total) by mouth daily. And additional tablet when taking metolazone, Disp: 45  tablet, Rfl: 5 .  promethazine (PHENERGAN) 12.5 MG tablet, Take 1 tablet (12.5 mg total) by mouth every 6 (six) hours as needed for nausea or vomiting., Disp: 30 tablet, Rfl: 1 .  rivaroxaban (XARELTO) 20 MG TABS tablet, Take 20 mg by mouth every evening. , Disp: , Rfl:  .  tiZANidine (ZANAFLEX) 4 MG tablet, Take 4 mg by mouth at bedtime. , Disp: , Rfl:   Past Medical History: Past Medical History:  Diagnosis Date  . Acute drug-induced gout of left foot 03/01/2018   Last Assessment & Plan:  Likely at least partially brought on by diuresis.  Needs to continue to  diurese  Will push hydration Stop allopurinol given initiation during acute flare may worsen this, re-broach this when asymptomatic Avoid nsaids given stomach pain Trial colchicine Add acetaminophen Ice, elevate, rest  . Allergy    seasonal  . Anxiety   . Arthritis    Right Knee  . Asthma   . CHF (congestive heart failure) ([place])   . COPD (chronic obstructive pulmonary disease) ([place])   . Coronary artery disease    Leaky heart valve  . Fibromyalgia   . GERD (gastroesophageal reflux disease)   . Hypertension   . Pneumonia   . PUD (peptic ulcer disease)   . Pulmonary HTN ([place])   . Rheumatic fever/heart disease   . Sleep apnea     Tobacco Use: Social History   Tobacco Use  Smoking Status Current Every Day Smoker  . Packs/day: 0.10  . Types: Cigarettes  Smokeless Tobacco Never Used  Tobacco Comment   03/06/19 still at one a day with coffee.  new quit date 03/20/19    Labs: Recent Review Flowsheet Data    Labs for ITP Cardiac and Pulmonary Rehab Latest Ref Rng & Units 06/03/2012 12/09/2013 04/03/2014 08/21/2014 12/20/2017   Cholestrol 0 - 200 mg/dL - 91 96 - -   LDLCALC mg/dL - 50 51 - -   HDL 35 - 70 mg/dL - 31(L) 32(A) - -   Trlycerides 40 - 160 mg/dL - 50 63 - -   Hemoglobin A1c - 4.5 - - 5.2 -   PHART 7.350 - 7.450 - - - - 7.39   PCO2ART 32.0 - 48.0 mmHg - - - - 41   HCO3 20.0 - 28.0 mmol/L - - - - 24.8   ACIDBASEDEF 0.0 - 2.0 mmol/L - - - - 0.2   O2SAT % - - - - 98.4       Pulmonary Assessment Scores: Pulmonary Assessment Scores    Row Name 01/16/19 1238         ADL UCSD   ADL Phase  Entry     SOB Score total  77     Rest  2     Walk  2     Stairs  4     Bath  3     Dress  4     Shop  3       CAT Score   CAT Score  24       mMRC Score   mMRC Score  2        Pulmonary Function Assessment: Pulmonary Function Assessment - 01/16/19 1239      Breath   Bilateral Breath Sounds  Clear    Shortness of Breath  Limiting activity;Yes       Exercise Target  Goals: Exercise Program Goal: Individual exercise prescription set using results from initial 6 min  walk test and THRR while considering  patient's activity barriers and safety.   Exercise Prescription Goal: Initial exercise prescription builds to 30-45 minutes a day of aerobic activity, 2-3 days per week.  Home exercise guidelines will be given to patient during program as part of exercise prescription that the participant will acknowledge.  Activity Barriers & Risk Stratification: Activity Barriers & Cardiac Risk Stratification - 01/16/19 1244      Activity Barriers & Cardiac Risk Stratification   Activity Barriers  Arthritis;Back Problems;Shortness of Breath;Joint Problems;Fibromyalgia;Deconditioning;Muscular Weakness;Decreased Ventricular Function       6 Minute Walk: 6 Minute Walk    Row Name 01/16/19 1438         6 Minute Walk   Phase  Initial     Distance  1067 feet     Walk Time  6 minutes     # of Rest Breaks  0     MPH  2.02     METS  2.7     RPE  15     Perceived Dyspnea   2     VO2 Peak  9.46     Symptoms  Yes (comment)     Comments  back and leg pain 8/10, SOB     Resting HR  90 bpm     Resting BP  124/72     Resting Oxygen Saturation   95 %     Exercise Oxygen Saturation  during 6 min walk  87 %     Max Ex. HR  108 bpm     Max Ex. BP  134/74     2 Minute Post BP  124/70       Interval HR   1 Minute HR  92     2 Minute HR  95     3 Minute HR  93     4 Minute HR  99     5 Minute HR  102     6 Minute HR  108     2 Minute Post HR  98     Interval Heart Rate?  Yes       Interval Oxygen   Interval Oxygen?  Yes     Baseline Oxygen Saturation %  95 %     1 Minute Oxygen Saturation %  92 %     1 Minute Liters of Oxygen  0 L Room Air     2 Minute Oxygen Saturation %  93 %     2 Minute Liters of Oxygen  0 L     3 Minute Oxygen Saturation %  87 %     3 Minute Liters of Oxygen  0 L     4 Minute Oxygen Saturation %  94 %     4 Minute Liters of Oxygen  0 L      5 Minute Oxygen Saturation %  90 %     5 Minute Liters of Oxygen  0 L     6 Minute Oxygen Saturation %  87 %     6 Minute Liters of Oxygen  0 L     2 Minute Post Oxygen Saturation %  98 %     2 Minute Post Liters of Oxygen  0 L       Oxygen Initial Assessment: Oxygen Initial Assessment - 01/16/19 1239      Home Oxygen   Home Oxygen Device  Portable Concentrator;Home Concentrator    Sleep Oxygen Prescription  Continuous    Liters per minute  3    Home Exercise Oxygen Prescription  None    Home at Rest Exercise Oxygen Prescription  None    Compliance with Home Oxygen Use  Yes      Initial 6 min Walk   Oxygen Used  None      Program Oxygen Prescription   Program Oxygen Prescription  None      Intervention   Short Term Goals  To learn and exhibit compliance with exercise, home and travel O2 prescription;To learn and understand importance of monitoring SPO2 with pulse oximeter and demonstrate accurate use of the pulse oximeter.;To learn and understand importance of maintaining oxygen saturations>88%;To learn and demonstrate proper pursed lip breathing techniques or other breathing techniques.;To learn and demonstrate proper use of respiratory medications    Long  Term Goals  Exhibits compliance with exercise, home and travel O2 prescription;Verbalizes importance of monitoring SPO2 with pulse oximeter and return demonstration;Maintenance of O2 saturations>88%;Exhibits proper breathing techniques, such as pursed lip breathing or other method taught during program session;Compliance with respiratory medication;Demonstrates proper use of MDI's       Oxygen Re-Evaluation: Oxygen Re-Evaluation    Row Name 01/23/19 1158             Program Oxygen Prescription   Program Oxygen Prescription  None         Home Oxygen   Sleep Oxygen Prescription  Continuous       Liters per minute  3       Home Exercise Oxygen Prescription  None       Home at Rest Exercise Oxygen Prescription   None       Compliance with Home Oxygen Use  No         Goals/Expected Outcomes   Short Term Goals  To learn and understand importance of monitoring SPO2 with pulse oximeter and demonstrate accurate use of the pulse oximeter.;To learn and understand importance of maintaining oxygen saturations>88%;To learn and demonstrate proper pursed lip breathing techniques or other breathing techniques.;To learn and demonstrate proper use of respiratory medications       Long  Term Goals  Verbalizes importance of monitoring SPO2 with pulse oximeter and return demonstration;Maintenance of O2 saturations>88%;Exhibits proper breathing techniques, such as pursed lip breathing or other method taught during program session;Compliance with respiratory medication;Demonstrates proper use of MDI's       Comments  Reviewed PLB technique with pt.  Talked about how it work and it's important to maintaining his exercise saturations.         Goals/Expected Outcomes  Short: Become more profiecient at using PLB.   Long: Become independent at using PLB.          Oxygen Discharge (Final Oxygen Re-Evaluation): Oxygen Re-Evaluation - 01/23/19 1158      Program Oxygen Prescription   Program Oxygen Prescription  None      Home Oxygen   Sleep Oxygen Prescription  Continuous    Liters per minute  3    Home Exercise Oxygen Prescription  None    Home at Rest Exercise Oxygen Prescription  None    Compliance with Home Oxygen Use  No      Goals/Expected Outcomes   Short Term Goals  To learn and understand importance of monitoring SPO2 with pulse oximeter and demonstrate accurate use of the pulse oximeter.;To learn and understand importance of maintaining oxygen saturations>88%;To learn and demonstrate proper pursed lip breathing techniques or other breathing techniques.;To learn  and demonstrate proper use of respiratory medications    Long  Term Goals  Verbalizes importance of monitoring SPO2 with pulse oximeter and return  demonstration;Maintenance of O2 saturations>88%;Exhibits proper breathing techniques, such as pursed lip breathing or other method taught during program session;Compliance with respiratory medication;Demonstrates proper use of MDI's    Comments  Reviewed PLB technique with pt.  Talked about how it work and it's important to maintaining his exercise saturations.      Goals/Expected Outcomes  Short: Become more profiecient at using PLB.   Long: Become independent at using PLB.       Initial Exercise Prescription: Initial Exercise Prescription - 01/16/19 1400      Date of Initial Exercise RX and Referring Provider   Date  01/16/19    Referring Provider  Paraschos, Alexander MD      Treadmill   MPH  1.5    Grade  0.5    Minutes  15    METs  2.25      NuStep   Level  2    SPM  80    Minutes  15    METs  2.2      REL-XR   Level  1    Speed  50    Minutes  15    METs  2.2      Prescription Details   Frequency (times per week)  3    Duration  Progress to 45 minutes of aerobic exercise without signs/symptoms of physical distress      Intensity   THRR 40-80% of Max Heartrate  122*153    Ratings of Perceived Exertion  11-13    Perceived Dyspnea  0-4      Progression   Progression  Continue to progress workloads to maintain intensity without signs/symptoms of physical distress.      Resistance Training   Training Prescription  Yes    Weight  3 lbs    Reps  10-15       Perform Capillary Blood Glucose checks as needed.  Exercise Prescription Changes: Exercise Prescription Changes    Row Name 01/16/19 1400 01/24/19 1300 02/06/19 1100 02/21/19 1500       Response to Exercise   Blood Pressure (Admit)  124/72  148/84  110/70  120/70    Blood Pressure (Exercise)  134/74  120/60  -  -    Blood Pressure (Exit)  124/70  100/62  112/62  124/62    Heart Rate (Admit)  90 bpm  96 bpm  85 bpm  90 bpm    Heart Rate (Exercise)  108 bpm  105 bpm  102 bpm  98 bpm    Heart Rate (Exit)   98 bpm  100 bpm  95 bpm  89 bpm    Oxygen Saturation (Admit)  95 %  96 %  94 %  93 %    Oxygen Saturation (Exercise)  87 %  95 %  91 %  93 %    Oxygen Saturation (Exit)  98 %  95 %  96 %  96 %    Rating of Perceived Exertion (Exercise)  15  15  15  12     Perceived Dyspnea (Exercise)  2  3  2  2     Symptoms  SOB, back/leg pain 8/10  none  none  none    Comments  walk test results  first full day of exercise  -  -    Duration  -  Progress to 45 minutes of aerobic exercise without signs/symptoms of physical distress  Continue with 45 min of aerobic exercise without signs/symptoms of physical distress.  Continue with 45 min of aerobic exercise without signs/symptoms of physical distress.    Intensity  -  THRR unchanged  THRR unchanged  THRR unchanged      Progression   Progression  -  Continue to progress workloads to maintain intensity without signs/symptoms of physical distress.  Continue to progress workloads to maintain intensity without signs/symptoms of physical distress.  Continue to progress workloads to maintain intensity without signs/symptoms of physical distress.    Average METs  -  2.42  2.94  2.94      Resistance Training   Training Prescription  -  Yes  Yes  Yes    Weight  -  3 lbs  3 lbs  3 lbs    Reps  -  10-15  10-15  10-15      Interval Training   Interval Training  -  No  No  No      Treadmill   MPH  -  1.5  2  2     Grade  -  0.5  1  1     Minutes  -  15  15  15     METs  -  2.25  2.81  2.81      NuStep   Level  -  -  2  2    Minutes  -  -  15  15    METs  -  -  2.4  2.4      REL-XR   Level  -  1  1  1     Minutes  -  15  15  15     METs  -  2.6  3.6  3.6      Home Exercise Plan   Plans to continue exercise at  -  -  Home (comment) walks at park near house 1-2 days per week outside of class  Home (comment) walks at park near house 1-2 days per week outside of class    Frequency  -  -  Add 2 additional days to program exercise sessions.  Add 2 additional days to  program exercise sessions.    Initial Home Exercises Provided  -  -  02/06/19  02/06/19       Exercise Comments: Exercise Comments    Row Name 01/23/19 1201           Exercise Comments  First full day of exercise!  Patient was oriented to gym and equipment including functions, settings, policies, and procedures.  Patient's individual exercise prescription and treatment plan were reviewed.  All starting workloads were established based on the results of the 6 minute walk test done at initial orientation visit.  The plan for exercise progression was also introduced and progression will be customized based on patient's performance and goals.          Exercise Goals and Review: Exercise Goals    Row Name 01/16/19 1443             Exercise Goals   Increase Physical Activity  Yes       Intervention  Provide advice, education, support and counseling about physical activity/exercise needs.;Develop an individualized exercise prescription for aerobic and resistive training based on initial evaluation findings, risk stratification, comorbidities and participant's personal goals.       Expected Outcomes  Short Term:  Attend rehab on a regular basis to increase amount of physical activity.;Long Term: Add in home exercise to make exercise part of routine and to increase amount of physical activity.;Long Term: Exercising regularly at least 3-5 days a week.       Increase Strength and Stamina  Yes       Intervention  Provide advice, education, support and counseling about physical activity/exercise needs.;Develop an individualized exercise prescription for aerobic and resistive training based on initial evaluation findings, risk stratification, comorbidities and participant's personal goals.       Expected Outcomes  Short Term: Increase workloads from initial exercise prescription for resistance, speed, and METs.;Short Term: Perform resistance training exercises routinely during rehab and add in  resistance training at home;Long Term: Improve cardiorespiratory fitness, muscular endurance and strength as measured by increased METs and functional capacity (6MWT)       Able to understand and use rate of perceived exertion (RPE) scale  Yes       Intervention  Provide education and explanation on how to use RPE scale       Expected Outcomes  Short Term: Able to use RPE daily in rehab to express subjective intensity level;Long Term:  Able to use RPE to guide intensity level when exercising independently       Able to understand and use Dyspnea scale  Yes       Intervention  Provide education and explanation on how to use Dyspnea scale       Expected Outcomes  Short Term: Able to use Dyspnea scale daily in rehab to express subjective sense of shortness of breath during exertion;Long Term: Able to use Dyspnea scale to guide intensity level when exercising independently       Knowledge and understanding of Target Heart Rate Range (THRR)  Yes       Intervention  Provide education and explanation of THRR including how the numbers were predicted and where they are located for reference       Expected Outcomes  Short Term: Able to state/look up THRR;Short Term: Able to use daily as guideline for intensity in rehab;Long Term: Able to use THRR to govern intensity when exercising independently       Able to check pulse independently  Yes       Intervention  Provide education and demonstration on how to check pulse in carotid and radial arteries.;Review the importance of being able to check your own pulse for safety during independent exercise       Expected Outcomes  Short Term: Able to explain why pulse checking is important during independent exercise;Long Term: Able to check pulse independently and accurately       Understanding of Exercise Prescription  Yes       Intervention  Provide education, explanation, and written materials on patient's individual exercise prescription       Expected Outcomes  Short  Term: Able to explain program exercise prescription;Long Term: Able to explain home exercise prescription to exercise independently          Exercise Goals Re-Evaluation : Exercise Goals Re-Evaluation    Row Name 01/23/19 1202 02/06/19 1156 02/21/19 1522 03/06/19 0956       Exercise Goal Re-Evaluation   Exercise Goals Review  Increase Physical Activity;Able to understand and use rate of perceived exertion (RPE) scale;Increase Strength and Stamina;Able to understand and use Dyspnea scale  Increase Physical Activity;Increase Strength and Stamina;Able to understand and use rate of perceived exertion (RPE) scale;Knowledge and understanding of  Target Heart Rate Range (THRR);Understanding of Exercise Prescription;Able to understand and use Dyspnea scale;Able to check pulse independently  Increase Physical Activity;Increase Strength and Stamina;Understanding of Exercise Prescription  Increase Physical Activity;Increase Strength and Stamina;Understanding of Exercise Prescription    Comments  Reviewed RPE scale, THR and program prescription with pt today.  Pt voiced understanding and was given a copy of goals to take home.   Reviewed home exercise with pt today.  Pt plans to walk at a park 1-2 days per week and do strengthening exercises at home for exercise.  Reviewed THR, pulse, RPE, sign and symptoms, NTG use, and when to call 911 or MD.  Also discussed weather considerations and indoor options.  Pt voiced understanding.  Chelsea Ramsey has been using her workout videos that we sent at home each day.  She really wants to get out but knows that she shouldn't.  She is eager to return to rehab.  She continues to work on her exercise so that she is ready when we are able to return.   Chelsea Ramsey continues to exercise at home. She has been getting out to walk and using our videos at home.  She is feeling good and read to get back to class.     Expected Outcomes  Short: Use RPE daily to regulate intensity. Long: Follow program  prescription in THR.  Short: add 1-2 days of walking at home on off days of class. Long: Become independent with an exercise routine.   Short: Continue to walk and use videos at home.  Long: Continue to increase activity levels.   Short: Continue to walk and use videos at home.  Long: Continue to increase activity levels.        Discharge Exercise Prescription (Final Exercise Prescription Changes): Exercise Prescription Changes - 02/21/19 1500      Response to Exercise   Blood Pressure (Admit)  120/70    Blood Pressure (Exit)  124/62    Heart Rate (Admit)  90 bpm    Heart Rate (Exercise)  98 bpm    Heart Rate (Exit)  89 bpm    Oxygen Saturation (Admit)  93 %    Oxygen Saturation (Exercise)  93 %    Oxygen Saturation (Exit)  96 %    Rating of Perceived Exertion (Exercise)  12    Perceived Dyspnea (Exercise)  2    Symptoms  none    Duration  Continue with 45 min of aerobic exercise without signs/symptoms of physical distress.    Intensity  THRR unchanged      Progression   Progression  Continue to progress workloads to maintain intensity without signs/symptoms of physical distress.    Average METs  2.94      Resistance Training   Training Prescription  Yes    Weight  3 lbs    Reps  10-15      Interval Training   Interval Training  No      Treadmill   MPH  2    Grade  1    Minutes  15    METs  2.81      NuStep   Level  2    Minutes  15    METs  2.4      REL-XR   Level  1    Minutes  15    METs  3.6      Home Exercise Plan   Plans to continue exercise at  Home (comment)   walks at park  near house 1-2 days per week outside of class   Frequency  Add 2 additional days to program exercise sessions.    Initial Home Exercises Provided  02/06/19       Nutrition:  Target Goals: Understanding of nutrition guidelines, daily intake of sodium <1527m, cholesterol <2069m calories 30% from fat and 7% or less from saturated fats, daily to have 5 or more servings of fruits and  vegetables.  Biometrics: Pre Biometrics - 01/16/19 1444      Pre Biometrics   Height  5' 6.4" (1.687 m)    Weight  273 lb 4.8 oz (124 kg)    Waist Circumference  45 inches    Hip Circumference  52 inches    Waist to Hip Ratio  0.87 %    BMI (Calculated)  43.56    Single Leg Stand  2.79 seconds        Nutrition Therapy Plan and Nutrition Goals: Nutrition Therapy & Goals - 01/16/19 1242      Intervention Plan   Intervention  Prescribe, educate and counsel regarding individualized specific dietary modifications aiming towards targeted core components such as weight, hypertension, lipid management, diabetes, heart failure and other comorbidities.;Nutrition handout(s) given to patient.    Expected Outcomes  Short Term Goal: Understand basic principles of dietary content, such as calories, fat, sodium, cholesterol and nutrients.;Short Term Goal: A plan has been developed with personal nutrition goals set during dietitian appointment.;Long Term Goal: Adherence to prescribed nutrition plan.       Nutrition Assessments: Nutrition Assessments - 01/16/19 1245      MEDFICTS Scores   Pre Score  30       Nutrition Goals Re-Evaluation: Nutrition Goals Re-Evaluation    [place] 02/08/19 1343             Goals   Nutrition Goal  Decrease soda and candy       Comment  Chelsea Ramsey had a Twix bar and soda before class today.  We talked about the imporatance of eliminating these from her diet. She says that she is still eating lots of candy just before bed at night.  We talked about getting it out of the bedroom as a start and to not buy it or if she wants some, not to get a full size bar.  She really wants to lose weight, but continues to eat candy, chips, and sodas.        Expected Outcome  Short: Move candy out of bedroom  Long: Continue to work towards eliminating candy and soda.           Nutrition Goals Discharge (Final Nutrition Goals Re-Evaluation): Nutrition Goals Re-Evaluation -  02/08/19 1343      Goals   Nutrition Goal  Decrease soda and candy    Comment  Chelsea Ramsey had a Twix bar and soda before class today.  We talked about the imporatance of eliminating these from her diet. She says that she is still eating lots of candy just before bed at night.  We talked about getting it out of the bedroom as a start and to not buy it or if she wants some, not to get a full size bar.  She really wants to lose weight, but continues to eat candy, chips, and sodas.     Expected Outcome  Short: Move candy out of bedroom  Long: Continue to work towards eliminating candy and soda.        Psychosocial: Target Goals: Acknowledge presence or  absence of significant depression and/or stress, maximize coping skills, provide positive support system. Participant is able to verbalize types and ability to use techniques and skills needed for reducing stress and depression.   Initial Review & Psychosocial Screening: Initial Psych Review & Screening - 01/16/19 1240      Initial Review   Current issues with  Current Sleep Concerns;Current Stress Concerns;Current Depression;Current Psychotropic Meds    Source of Stress Concerns  Transportation;Unable to perform yard/household activities;Financial;Chronic Illness    Comments  Still struggling ot sleep with breathing.  She is taking [employer] bus for transportation. Took course of [employer].  Family is still helping her get around.       Family Dynamics   Good Support System?  Yes    Comments  Support system is mother, aunt, and female friend      Barriers   Psychosocial barriers to participate in program  The patient should benefit from training in stress management and relaxation.;Psychosocial barriers identified (see note)      Screening Interventions   Interventions  Encouraged to exercise;Program counselor consult;To provide support and resources with identified psychosocial needs;Provide feedback about the scores to participant    Expected Outcomes   Short Term goal: Utilizing psychosocial counselor, staff and physician to assist with identification of specific Stressors or current issues interfering with healing process. Setting desired goal for each stressor or current issue identified.;Long Term Goal: Stressors or current issues are controlled or eliminated.;Short Term goal: Identification and review with participant of any Quality of Life or Depression concerns found by scoring the questionnaire.;Long Term goal: The participant improves quality of Life and PHQ9 Scores as seen by post scores and/or verbalization of changes       Quality of Life Scores:  Scores of 19 and below usually indicate a poorer quality of life in these areas.  A difference of  2-3 points is a clinically meaningful difference.  A difference of 2-3 points in the total score of the Quality of Life Index has been associated with significant improvement in overall quality of life, self-image, physical symptoms, and general health in studies assessing change in quality of life.  PHQ-9: Recent Review Flowsheet Data    Depression screen [facility] 2/9 02/08/2019 01/16/2019 01/05/2019 11/14/2018 08/25/2018   Decreased Interest 0 1 0 0 1   Down, Depressed, Hopeless 0 0 0 0 0   PHQ - 2 Score 0 1 0 0 1   Altered sleeping 1 2 - - 2   Tired, decreased energy 0 1 - - 2   Change in appetite 1 2 - - 1   Feeling bad or failure about yourself  0 0 - - 0   Trouble concentrating 0 0 - - 2   Moving slowly or fidgety/restless 0 0 - - 0   Suicidal thoughts 0 0 - - 0   PHQ-9 Score 2 6 - - 8   Difficult doing work/chores Not difficult at all Not difficult at all - - Not difficult at all     Interpretation of Total Score  Total Score Depression Severity:  1-4 = Minimal depression, 5-9 = Mild depression, 10-14 = Moderate depression, 15-19 = Moderately severe depression, 20-27 = Severe depression   Psychosocial Evaluation and Intervention: Psychosocial Evaluation - 01/23/19 1222       Psychosocial Evaluation & Interventions   Interventions  Encouraged to exercise with the program and follow exercise prescription;Relaxation education;Stress management education    Comments  [name] [facility]) transferred  from the Cardiac Rehab program to this Pulmonary Rehab program in the last several months.   She continues to have a strong support system with lots of family locally.  She has multiple health issues but COPD and Pulmonary Lung Disease on top of her diabetes seem to concern her the most.  Chelsea Ramsey reports sleeping intermittently with possibly 6 hours per night.  She is in the process of getting all her doctors and medical services transferred to this community from [facility] since she needs a new sleep study done.  Chelsea Ramsey has a history of depression and anxiety; is being treated for this and is typically in a positive mood.  She reports her health is her biggest stressor and she wants to breathe better and quit smoking while in this program.  Staff will follow.     Expected Outcomes  Short:  Chelsea Ramsey will connect with a local PCP (option provided by counselor) to transfer all of her medical providers locally since she has no transportation.  She hopes to get a new sleep study done soon as a result.  Long:  Chelsea Ramsey will quit smoking for her health and learn healthy coping strategies including exercise.      Continue Psychosocial Services   Follow up required by staff       Psychosocial Re-Evaluation: Psychosocial Re-Evaluation    [place] Name 02/08/19 1411 03/06/19 0958           Psychosocial Re-Evaluation   Current issues with  History of Depression;Current Stress Concerns  History of Depression;Current Stress Concerns      Comments  Reviewed patient health questionnaire (PHQ-9) with patient for follow up. Previously, patients score indicated signs/symptoms of depression.  Reviewed to see if patient is improving symptom wise while in program.  Score improved and patient states that it is because she  has been able to come and exercise. She is also practicing staying positive. She is happy to be alive and wants to continue to be healthy.  Chelsea Ramsey is doing well at home.  She is ready for everything to be over!  She is planning to attend the [place] meeting for [facility] tomorrow to connect with some of her friends.       Expected Outcomes  Short: Continue to attend [facility] regularly for regular exercise and social engagement. Long: Continue to improve symptoms and manage a positive mental state.  Short: Attend [facility] meeting.  Long: Continue to cope postiviely.       Interventions  Encouraged to attend Pulmonary Rehabilitation for the exercise  Encouraged to attend Pulmonary Rehabilitation for the exercise      Continue Psychosocial Services   Follow up required by staff  Follow up required by staff         Psychosocial Discharge (Final Psychosocial Re-Evaluation): Psychosocial Re-Evaluation - 03/06/19 0958      Psychosocial Re-Evaluation   Current issues with  History of Depression;Current Stress Concerns    Comments  Chelsea Ramsey is doing well at home.  She is ready for everything to be over!  She is planning to attend the [place] meeting for [facility] tomorrow to connect with some of her friends.     Expected Outcomes  Short: Attend [facility] meeting.  Long: Continue to cope postiviely.     Interventions  Encouraged to attend Pulmonary Rehabilitation for the exercise    Continue Psychosocial Services   Follow up required by staff       Education: Education Goals: Education classes will be provided on  a weekly basis, covering required topics. Participant will state understanding/return demonstration of topics presented.  Learning Barriers/Preferences: Learning Barriers/Preferences - 01/16/19 1242      Learning Barriers/Preferences   Learning Barriers  None    Learning Preferences  Individual Instruction       Education Topics:  Initial Evaluation Education: - Verbal, written and  demonstration of respiratory meds, oximetry and breathing techniques. Instruction on use of nebulizers and MDIs and importance of monitoring MDI activations.   General Nutrition Guidelines/Fats and Fiber: -Group instruction provided by verbal, written material, models and posters to present the general guidelines for heart healthy nutrition. Gives an explanation and review of dietary fats and fiber.   Controlling Sodium/Reading Food Labels: -Group verbal and written material supporting the discussion of sodium use in heart healthy nutrition. Review and explanation with models, verbal and written materials for utilization of the food label.   Exercise Physiology & General Exercise Guidelines: - Group verbal and written instruction with models to review the exercise physiology of the cardiovascular system and associated critical values. Provides general exercise guidelines with specific guidelines to those with heart or lung disease.    Aerobic Exercise & Resistance Training: - Gives group verbal and written instruction on the various components of exercise. Focuses on aerobic and resistive training programs and the benefits of this training and how to safely progress through these programs.   Flexibility, Balance, Mind/Body Relaxation: Provides group verbal/written instruction on the benefits of flexibility and balance training, including mind/body exercise modes such as yoga, pilates and tai chi.  Demonstration and skill practice provided.   Stress and Anxiety: - Provides group verbal and written instruction about the health risks of elevated stress and causes of high stress.  Discuss the correlation between heart/lung disease and anxiety and treatment options. Review healthy ways to manage with stress and anxiety.   Pulmonary Rehab from 02/08/2019 in [facility] Cardiac and Pulmonary Rehab  Date  09/13/18  Educator  [facility]  Instruction Review Code  1- Verbalizes Understanding      Depression: -  Provides group verbal and written instruction on the correlation between heart/lung disease and depressed mood, treatment options, and the stigmas associated with seeking treatment.   Pulmonary Rehab from 02/08/2019 in [facility] Cardiac and Pulmonary Rehab  Date  02/01/19  Educator  [place]  Instruction Review Code  1- Verbalizes Understanding      Exercise & Equipment Safety: - Individual verbal instruction and demonstration of equipment use and safety with use of the equipment.   Pulmonary Rehab from 02/08/2019 in [facility] Cardiac and Pulmonary Rehab  Date  08/25/18  Educator  [facility]  Instruction Review Code  1- Verbalizes Understanding      Infection Prevention: - Provides verbal and written material to individual with discussion of infection control including proper hand washing and proper equipment cleaning during exercise session.   Pulmonary Rehab from 02/08/2019 in [facility] Cardiac and Pulmonary Rehab  Date  08/25/18  Educator  [facility]  Instruction Review Code  1- Verbalizes Understanding      Falls Prevention: - Provides verbal and written material to individual with discussion of falls prevention and safety.   Pulmonary Rehab from 02/08/2019 in [facility] Cardiac and Pulmonary Rehab  Date  08/25/18  Educator  [facility]  Instruction Review Code  1- Verbalizes Understanding      Diabetes: - Individual verbal and written instruction to review signs/symptoms of diabetes, desired ranges of glucose level fasting, after meals and with exercise. Advice that pre and  post exercise glucose checks will be done for 3 sessions at entry of program.   Chronic Lung Diseases: - Group verbal and written instruction to review updates, respiratory medications, advancements in procedures and treatments. Discuss use of supplemental oxygen including available portable oxygen systems, continuous and intermittent flow rates, concentrators, personal use and safety guidelines. Review proper use of inhaler and spacers. Provide informative  websites for self-education.    Energy Conservation: - Provide group verbal and written instruction for methods to conserve energy, plan and organize activities. Instruct on pacing techniques, use of adaptive equipment and posture/positioning to relieve shortness of breath.   Triggers and Exacerbations: - Group verbal and written instruction to review types of environmental triggers and ways to prevent exacerbations. Discuss weather changes, air quality and the benefits of nasal washing. Review warning signs and symptoms to help prevent infections. Discuss techniques for effective airway clearance, coughing, and vibrations.   AED/CPR: - Group verbal and written instruction with the use of models to demonstrate the basic use of the AED with the basic ABC's of resuscitation.   Anatomy and Physiology of the Lungs: - Group verbal and written instruction with the use of models to provide basic lung anatomy and physiology related to function, structure and complications of lung disease.   Pulmonary Rehab from 02/08/2019 in [facility] Cardiac and Pulmonary Rehab  Date  02/08/19  Educator  [facility]  Instruction Review Code  1- Verbalizes Understanding      Anatomy & Physiology of the Heart: - Group verbal and written instruction and models provide basic cardiac anatomy and physiology, with the coronary electrical and arterial systems. Review of Valvular disease and Heart Failure   Cardiac Medications: - Group verbal and written instruction to review commonly prescribed medications for heart disease. Reviews the medication, class of the drug, and side effects.   Know Your Numbers and Risk Factors: -Group verbal and written instruction about important numbers in your health.  Discussion of what are risk factors and how they play a role in the disease process.  Review of Cholesterol, Blood Pressure, Diabetes, and BMI and the role they play in your overall health.   Pulmonary Rehab from 02/08/2019 in [facility]  Cardiac and Pulmonary Rehab  Date  09/08/18  Educator  [facility]  Instruction Review Code  1- Verbalizes Understanding      Sleep Hygiene: -Provides group verbal and written instruction about how sleep can affect your health.  Define sleep hygiene, discuss sleep cycles and impact of sleep habits. Review good sleep hygiene tips.    Pulmonary Rehab from 02/08/2019 in [facility] Cardiac and Pulmonary Rehab  Date  10/11/18  Educator  [facility]  Instruction Review Code  1- Verbalizes Understanding      Other: -Provides group and verbal instruction on various topics (see comments)    Knowledge Questionnaire Score: Knowledge Questionnaire Score - 01/16/19 1242      Knowledge Questionnaire Score   Pre Score  15/18   reviewed with pt today       Core Components/Risk Factors/Patient Goals at Admission: Personal Goals and Risk Factors at Admission - 01/16/19 1243      Core Components/Risk Factors/Patient Goals on Admission    Weight Management  Yes;Obesity;Weight Loss    Intervention  Weight Management: Develop a combined nutrition and exercise program designed to reach desired caloric intake, while maintaining appropriate intake of nutrient and fiber, sodium and fats, and appropriate energy expenditure required for the weight goal.;Weight Management: Provide education and appropriate resources to help  participant work on and attain dietary goals.;Weight Management/Obesity: Establish reasonable short term and long term weight goals.;Obesity: Provide education and appropriate resources to help participant work on and attain dietary goals.    Admit Weight  273 lb 4.8 oz (124 kg)    Goal Weight: Short Term  268 lb (121.6 kg)    Goal Weight: Long Term  220 lb (99.8 kg)    Expected Outcomes  Short Term: Continue to assess and modify interventions until short term weight is achieved;Long Term: Adherence to nutrition and physical activity/exercise program aimed toward attainment of established weight goal;Weight  Loss: Understanding of general recommendations for a balanced deficit meal plan, which promotes 1-2 lb weight loss per week and includes a negative energy balance of [phone] kcal/d;Understanding recommendations for meals to include 15-35% energy as protein, 25-35% energy from fat, 35-60% energy from carbohydrates, less than 227m of dietary cholesterol, 20-35 gm of total fiber daily;Understanding of distribution of calorie intake throughout the day with the consumption of 4-5 meals/snacks    Tobacco Cessation  Yes    Number of packs per day  1-2 cigarettes a day    Intervention  Assist the participant in steps to quit. Provide individualized education and counseling about committing to Tobacco Cessation, relapse prevention, and pharmacological support that can be provided by physician.;OAdvice worker assist with locating and accessing local/national Quit Smoking programs, and support quit date choice.   Quit Date: 02/14/19   Expected Outcomes  Short Term: Will demonstrate readiness to quit, by selecting a quit date.;Short Term: Will quit all tobacco product use, adhering to prevention of relapse plan.;Long Term: Complete abstinence from all tobacco products for at least 12 months from quit date.    Improve shortness of breath with ADL's  Yes    Intervention  Provide education, individualized exercise plan and daily activity instruction to help decrease symptoms of SOB with activities of daily living.    Expected Outcomes  Short Term: Improve cardiorespiratory fitness to achieve a reduction of symptoms when performing ADLs;Long Term: Be able to perform more ADLs without symptoms or delay the onset of symptoms    Heart Failure  Yes    Intervention  Provide a combined exercise and nutrition program that is supplemented with education, support and counseling about heart failure. Directed toward relieving symptoms such as shortness of breath, decreased exercise tolerance, and extremity edema.     Expected Outcomes  Improve functional capacity of life;Short term: Attendance in program 2-3 days a week with increased exercise capacity. Reported lower sodium intake. Reported increased fruit and vegetable intake. Reports medication compliance.;Short term: Daily weights obtained and reported for increase. Utilizing diuretic protocols set by physician.;Long term: Adoption of self-care skills and reduction of barriers for early signs and symptoms recognition and intervention leading to self-care maintenance.    Hypertension  Yes    Intervention  Provide education on lifestyle modifcations including regular physical activity/exercise, weight management, moderate sodium restriction and increased consumption of fresh fruit, vegetables, and low fat dairy, alcohol moderation, and smoking cessation.;Monitor prescription use compliance.    Expected Outcomes  Short Term: Continued assessment and intervention until BP is < 140/975mHG in hypertensive participants. < 130/8056mG in hypertensive participants with diabetes, heart failure or chronic kidney disease.;Long Term: Maintenance of blood pressure at goal levels.       Core Components/Risk Factors/Patient Goals Review:  Goals and Risk Factor Review    Row Name 02/21/19 1518 03/06/19 1004  Core Components/Risk Factors/Patient Goals Review   Personal Goals Review  Weight Management/Obesity;Tobacco Cessation;Heart Failure  Tobacco Cessation;Heart Failure;Weight Management/Obesity      Review  Chelsea Ramsey is doing well at home.  She is using her videos to stay active.  She is holding steady around 257 lbs.  She is down to two cigarettes a day!  She is going to talk to her doctor about trying the lozenges again to help her quit for good. She is ready to quit, but still holding on to her last two.  She will also try using the fake ones in the morning.  She has been staying on top of her heart failure and not currently having any symptoms.    Chelsea Ramsey continues  to do well at home.  Her weight has been up and down but she had an appointment with heart failure clinic last week.  She is allowed to take some extra lasix to help.  She has really been working on watcing her salt intake.  She is still at one cigarette a day. It is first thing in the morning with her coffee.  She is determined to quit before returning to rehab.  Her new quit date is 03/20/19. That is two weeks from today.      Expected Outcomes  Short:  Quit smoking!!  Long: Continue to stay on top of her weight and manage her heart failure.   Short: Quit smoking by 03/20/19.  Long: Continue to manage heart failure.          Core Components/Risk Factors/Patient Goals at Discharge (Final Review):  Goals and Risk Factor Review - 03/06/19 1004      Core Components/Risk Factors/Patient Goals Review   Personal Goals Review  Tobacco Cessation;Heart Failure;Weight Management/Obesity    Review  Chelsea Ramsey continues to do well at home.  Her weight has been up and down but she had an appointment with heart failure clinic last week.  She is allowed to take some extra lasix to help.  She has really been working on watcing her salt intake.  She is still at one cigarette a day. It is first thing in the morning with her coffee.  She is determined to quit before returning to rehab.  Her new quit date is 03/20/19. That is two weeks from today.    Expected Outcomes  Short: Quit smoking by 03/20/19.  Long: Continue to manage heart failure.        ITP Comments: ITP Comments    Row Name 01/16/19 1437 02/06/19 0817 02/20/19 0931 03/06/19 1020     ITP Comments  Medical evaluation and 6MWT completed today.  Documentation for diaganosis can be found in [place] encounter 01/03/19 with Dr. Saralyn Pilar.  Initial ITP created and sent for review to [name], Medical Director.   30 day review completed. ITP sent to [name] Director of [place]. Continue with ITP unless changes are made by physician.  Our program is  currently closed due to COVID-19.  We are communicating with patient via phone calls and emails.  30 day review completed. ITP sent to [name] for review,changes as needed and signature. Continue with ITP unless changes directed by Dr. Sabra Heck.        Comments:

## 2019-03-07 ENCOUNTER — Other Ambulatory Visit: Payer: Self-pay

## 2019-03-07 ENCOUNTER — Ambulatory Visit: Payer: Medicaid Other | Attending: Nurse Practitioner | Admitting: Nurse Practitioner

## 2019-03-07 DIAGNOSIS — Z7901 Long term (current) use of anticoagulants: Secondary | ICD-10-CM

## 2019-03-07 DIAGNOSIS — M47816 Spondylosis without myelopathy or radiculopathy, lumbar region: Secondary | ICD-10-CM

## 2019-03-07 DIAGNOSIS — G894 Chronic pain syndrome: Secondary | ICD-10-CM | POA: Diagnosis not present

## 2019-03-07 DIAGNOSIS — M25552 Pain in left hip: Secondary | ICD-10-CM | POA: Diagnosis not present

## 2019-03-07 DIAGNOSIS — G8929 Other chronic pain: Secondary | ICD-10-CM

## 2019-03-07 DIAGNOSIS — M47818 Spondylosis without myelopathy or radiculopathy, sacral and sacrococcygeal region: Secondary | ICD-10-CM | POA: Diagnosis not present

## 2019-03-07 DIAGNOSIS — M461 Sacroiliitis, not elsewhere classified: Secondary | ICD-10-CM

## 2019-03-07 MED ORDER — OXYCODONE HCL 10 MG PO TABS: 5.0000 mg | ORAL_TABLET | Freq: Two times a day (BID) | ORAL | 0 refills | Status: DC | PRN

## 2019-03-07 NOTE — Progress Notes (Signed)
Pain Management Encounter Note - Virtual Visit via Telephone Telehealth (real-time audio visits between healthcare provider and patient).  Patient's Phone No. & Preferred Pharmacy:  [phone] (home); [phone] (mobile); (Preferred) [phone]  [place], [place] [address] [place] [address] Phone: [phone] Fax: [phone]   Pre-screening note:  Our staff contacted Chelsea Ramsey and offered her an "in person", "face-to-face" appointment versus a telephone encounter. She indicated preferring the telephone encounter, at this time.  Reason for Virtual Visit: COVID-19*  Social distancing based on CDC and AMA recommendations.   I contacted Chelsea Ramsey on 03/07/2019 at 8:58 AM by telephone and clearly identified myself as Dionisio David, NP. I verified that I was speaking with the correct person using two identifiers (Name and date of birth: [birth year]).  Advanced Informed Consent I sought verbal advanced consent from Chelsea Ramsey for telemedicine interactions and virtual visit. I informed Chelsea Ramsey of the security and privacy concerns, risks, and limitations associated with performing an evaluation and management service by telephone. I also informed Chelsea Ramsey of the availability of "in person" appointments and I informed her of the possibility of a patient responsible charge related to this service. Chelsea Ramsey expressed understanding and agreed to proceed.   Historic Elements   Chelsea Ramsey is a 53 y.o. year old, female patient evaluated today after her last encounter by our practice on 02/08/2019. Chelsea Ramsey  has a past medical history of Acute drug-induced gout of left foot (03/01/2018), Allergy, Anxiety, Arthritis, Asthma, CHF (congestive heart failure) ([place]), COPD (chronic obstructive pulmonary disease) ([place]), Coronary artery disease, Fibromyalgia, GERD (gastroesophageal reflux  disease), Hypertension, Pneumonia, PUD (peptic ulcer disease), Pulmonary HTN ([place]), Rheumatic fever/heart disease, and Sleep apnea. 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 Chelsea Ramsey has a current medication list which includes the following prescription(s): allopurinol, budesonide-formoterol, butalbital-acetaminophen-caffeine, gnp calcium 1200, vitamin d3, furosemide, gabapentin, guaifenesin, ipratropium-albuterol, lactulose, magnesium oxide, meclizine, metolazone, metoprolol tartrate, nitroglycerin, nortriptyline, omeprazole, oxycodone hcl, oxycodone hcl, oxycodone hcl, potassium chloride sa, promethazine, rivaroxaban, and tizanidine. She  reports that she has been smoking cigarettes. She has been smoking about 0.10 packs per day. She has never used smokeless tobacco. She reports previous alcohol use of about 3.0 standard drinks of alcohol per week. She reports previous drug use. Chelsea Ramsey is allergic to amiodarone; aspirin; flexeril [cyclobenzaprine]; trazamine [name] & diet manage prod]; codeine; and tramadol.   HPI  I last saw her on 10/10/2018. She is being evaluated for medication management. She has 3-4/10 low back and knee pain. She admits that her back pain is getting worse. She states that her "back feels crunchy". She admits that with standing for 2 minutes it increases her pain. She admits that is trying to loose weight. She admits that she has increases in her fluid which increases her weight and makes her pain worse. She has numbness and tingling on the left leg on the outside into the knee. She denies any right leg pain. She has weakness. She denies any falls. She desires to get some in home assistance in the future. She denies any side effects of her medication.   Pharmacotherapy  Assessment  Analgesic: Oxycodone 10mg  BID MME/day: 30 mg/day.   Monitoring: Pharmacotherapy: No side-effects or adverse reactions reported. [place] PMP: PDMP reviewed during this encounter.  Compliance: No problems identified. Plan: Refer to "POC".  Review of recent tests  MM 3D SCREEN BREAST BILATERAL CLINICAL DATA:  Screening.  EXAM: DIGITAL SCREENING BILATERAL MAMMOGRAM WITH TOMO AND CAD  COMPARISON:  Previous exam(s).  ACR Breast Density Category b: There are scattered areas of fibroglandular density.  FINDINGS: There are no findings suspicious for malignancy. Images were processed with CAD.  IMPRESSION: No mammographic evidence of malignancy. A result letter of this screening mammogram will be mailed directly to the patient.  RECOMMENDATION: Screening mammogram in one year. (Code:SM-B-01Y)  BI-RADS CATEGORY  1: Negative.  Electronically Signed   By: Margarette Canada M.D.   On: 01/27/2019 13:46   Office Visit on 02/08/2019  Component Date Value Ref Range Status  . Summary 02/08/2019 FINAL   Final   Comment: ==================================================================== TOXASSURE SELECT 13 (MW) ==================================================================== Test                             Result       Flag       Units Drug Present not Declared for Prescription Verification   Oxycodone                      760          UNEXPECTED ng/mg creat   Oxymorphone                    1283         UNEXPECTED ng/mg creat   Noroxycodone                   1082         UNEXPECTED ng/mg creat   Noroxymorphone                 378          UNEXPECTED ng/mg creat    Sources of oxycodone are scheduled prescription medications.    Oxymorphone, noroxycodone, and noroxymorphone are expected    metabolites of oxycodone. Oxymorphone is also available as a    scheduled prescription medication. Drug Absent but Declared for Prescription Verification   Butalbital                      Not Detected UNEXPECTED ==================================================================== Test                                                Result    Flag   Units      Ref Range   Creatinine              107              mg/dL      >=20 ==================================================================== Declared Medications:  The flagging and interpretation on this report are based on the  following declared medications.  Unexpected results may arise from  inaccuracies in the declared medications.  **Note: The testing scope of this panel includes these medications:  Butalbital (Fioricet)  **Note: The testing scope of this panel does not include following  reported medications:  Acetaminophen (Fioricet)  Albuterol (Duoneb)  Allopurinol  Bisacodyl  Budesonide (Symbicort)  Caffeine (Fioricet)  Calcium (Calcium/Vitamin D)  Cholecalciferol  Colchicine  Formoterol (Symbicort)  Furosemide  Gabapentin  Guaifenesin  Ipratropium (Duoneb)  Lactulose  Vitamin D (Calcium/Vitamin D) ==================================================================== For clinical consultation, please call [phone]. ====================================================================    Assessment  The encounter diagnosis was Chronic pain syndrome.  Plan of Care  I have changed Chelsea Ramsey's Oxycodone HCl and Oxycodone HCl. I am also having her maintain her ipratropium-albuterol, budesonide-formoterol, omeprazole, rivaroxaban, magnesium oxide, lactulose, tiZANidine, nitroGLYCERIN, Vitamin D3, GNP Calcium 1200, meclizine, guaiFENesin, gabapentin, furosemide, potassium chloride SA, metolazone, butalbital-acetaminophen-caffeine, metoprolol tartrate, allopurinol, nortriptyline, promethazine, and Oxycodone HCl.  Pharmacotherapy (Medications Ordered): Meds ordered this encounter  Medications  . Oxycodone HCl 10 MG TABS    Sig: Take 0.5-1 tablets (5-10 mg total)  by mouth 2 (two) times daily as needed for up to 30 days. Must last 30 days. MAX.: 2/day    Dispense:  60 tablet    Refill:  0    [place] STOP ACT - Not applicable to Chronic Pain Syndrome (G89.4) diagnosis. Fill one day early if pharmacy is closed on scheduled refill date.    Order Specific Question:   Supervising Provider    Answer:   Milinda Pointer [phone]  . Oxycodone HCl 10 MG TABS    Sig: Take 0.5-1 tablets (5-10 mg total) by mouth 2 (two) times daily as needed for up to 30 days. Must last 30 days.    Dispense:  60 tablet    Refill:  0    [place] STOP ACT - Not applicable. Fill one day early if pharmacy is closed on scheduled refill date.    Order Specific Question:   Supervising Provider    Answer:   Milinda Pointer [phone]  . Oxycodone HCl 10 MG TABS    Sig: Take 0.5-1 tablets (5-10 mg total) by mouth 2 (two) times daily as needed for up to 30 days. Must last 7 days.    Dispense:  60 tablet    Refill:  0    [place] STOP ACT - Not applicable. Fill one day early if pharmacy is closed on scheduled refill date.    Order Specific Question:   Supervising Provider    Answer:   Milinda Pointer [phone]   Orders:  No orders of the defined types were placed in this encounter.  Follow-up plan:   No follow-ups on file.   I discussed the assessment and treatment plan with the patient. The patient was provided an opportunity to ask questions and all were answered. The patient agreed with the plan and demonstrated an understanding of the instructions.  Patient advised to call back or seek an in-person evaluation if the symptoms or condition worsens.  Total duration of non-face-to-face encounter: 12 minutes.  Note by: Dionisio David, NP Date: 03/07/2019; Time: 11:48 AM  Disclaimer:  * Given the special circumstances of the COVID-19 pandemic, the federal government has announced that the Office for Civil Rights (OCR) will exercise its enforcement discretion and will not impose penalties on  physicians using telehealth in the event of noncompliance with regulatory requirements under the [place] and [place] (HIPAA) in connection with the good faith provision of telehealth during the FVCBS-49 national public health emergency. ([place])

## 2019-03-08 DIAGNOSIS — I5032 Chronic diastolic (congestive) heart failure: Secondary | ICD-10-CM

## 2019-03-09 ENCOUNTER — Ambulatory Visit: Payer: Self-pay | Admitting: Physician Assistant

## 2019-03-13 NOTE — Progress Notes (Unsigned)
Based on referral from staff dietician, counselor attempted to contact patient regarding available support.  Message left encouraging patient to call back.  If patient doesn't return call, counselor will try again within the week.

## 2019-03-15 NOTE — Progress Notes (Unsigned)
Counselor contacted patient per request of staff dietician.  Patient indicated that she was at her mother's house and would prefer that counselor call back on Friday, 4/17.

## 2019-03-17 NOTE — Progress Notes (Unsigned)
Contacted patient regarding on-going support availability.  Patient spoke of phone appointment with physician this morning regarding her sore throat.  When asked about stressors she had mentioned to the dietician, patient indicated that her stressors lay within her home and she was not comfortable talking about things over the phone.  She indicated that she would prefer to talk in person 'once the virus was over.'  Counselor asked about self-care and patient reported that she was just 'getting on as best she could.'  Patient is open to counselor contacting her by phone every 2-3 weeks to check in.  Counselor also provided patient her contact information and encouraged her to reach out as needed.

## 2019-03-20 ENCOUNTER — Encounter: Payer: Self-pay | Admitting: *Deleted

## 2019-03-20 DIAGNOSIS — I5032 Chronic diastolic (congestive) heart failure: Secondary | ICD-10-CM

## 2019-03-29 DIAGNOSIS — I5032 Chronic diastolic (congestive) heart failure: Secondary | ICD-10-CM

## 2019-03-29 NOTE — Progress Notes (Signed)
Counselor attempted to contact patient regarding ongoing follow up, message left.  Counselor will attempt follow up again next month.

## 2019-03-31 ENCOUNTER — Telehealth: Payer: Self-pay

## 2019-03-31 NOTE — Telephone Encounter (Signed)
   TELEPHONE CALL NOTE  This patient has been deemed a candidate for follow-up tele-health visit to limit community exposure during the Covid-19 pandemic. I spoke with the patient via phone to discuss instructions. The patient was advised to review the section on consent for treatment as well. The patient will receive a phone call 2-3 days prior to their E-Visit at which time consent will be verbally confirmed. A Virtual Office Visit appointment type has been scheduled for 04/04/2019 with Darylene Price FNP.  Gaylord Shih, [place] 03/31/2019 12:05 PM

## 2019-03-31 NOTE — Telephone Encounter (Signed)
TELEPHONE CALL NOTE  Chelsea Ramsey has been deemed a candidate for a follow-up tele-health visit to limit community exposure during the Covid-19 pandemic. I spoke with the patient via phone to ensure availability of phone/video source, confirm preferred email & phone number, discuss instructions and expectations, and review consent.   I reminded Chelsea Ramsey to be prepared with any vital sign and/or heart rhythm information that could potentially be obtained via home monitoring, at the time of her visit.  Finally, I reminded Chelsea Ramsey to expect an e-mail containing a link for their video-based visit approximately 15 minutes before her visit, or alternatively, a phone call at the time of her visit if her visit is planned to be a phone encounter.  Did the patient verbally consent to treatment as below? YES  Chelsea Ramsey, CMA 03/31/2019 12:05 PM  CONSENT FOR TELE-HEALTH VISIT - PLEASE REVIEW  I hereby voluntarily request, consent and authorize [facility] and its employed or contracted physicians, physician assistants, nurse practitioners or other licensed health care professionals (the Practitioner), to provide me with telemedicine health care services (the "Services") as deemed necessary by the treating Practitioner. I acknowledge and consent to receive the Services by the Practitioner via telemedicine. I understand that the telemedicine visit will involve communicating with the Practitioner through telephonic communication technology and the disclosure of certain medical information by electronic transmission. I acknowledge that I have been given the opportunity to request an in-person assessment or other available alternative prior to the telemedicine visit and am voluntarily participating in the telemedicine visit.  I understand that I have the right to withhold or withdraw my consent to the use of telemedicine in the course of my care at  any time, without affecting my right to future care or treatment, and that the Practitioner or I may terminate the telemedicine visit at any time. I understand that I have the right to inspect all information obtained and/or recorded in the course of the telemedicine visit and may receive copies of available information for a reasonable fee.  I understand that some of the potential risks of receiving the Services via telemedicine include:  Marland Kitchen Delay or interruption in medical evaluation due to technological equipment failure or disruption; . Information transmitted may not be sufficient (e.g. poor resolution of images) to allow for appropriate medical decision making by the Practitioner; and/or  . In rare instances, security protocols could fail, causing a breach of personal health information.  Furthermore, I acknowledge that it is my responsibility to provide information about my medical history, conditions and care that is complete and accurate to the best of my ability. I acknowledge that Practitioner's advice, recommendations, and/or decision may be based on factors not within their control, such as incomplete or inaccurate data provided by me or lack of visual representation. I understand that the practice of medicine is not an exact science and that Practitioner makes no warranties or guarantees regarding treatment outcomes. I acknowledge that I will receive a copy of this consent concurrently upon execution via email to the email address I last provided but may also request a printed copy by calling the office of [facility].    I understand that my insurance may be billed for this visit.   I have read or had this consent read to me. . I understand the contents of this consent, which adequately explains the benefits and risks of the Services being provided via telemedicine.  Marland Kitchen  I have been provided ample opportunity to ask questions regarding this consent and the Services and have had my  questions answered to my satisfaction. . I give my informed consent for the services to be provided through the use of telemedicine in my medical care  By participating in this telemedicine visit I agree to the above.]

## 2019-04-04 ENCOUNTER — Other Ambulatory Visit: Payer: Self-pay

## 2019-04-04 ENCOUNTER — Encounter: Payer: Self-pay | Admitting: Family

## 2019-04-04 ENCOUNTER — Ambulatory Visit: Payer: Medicaid Other | Attending: Family | Admitting: Family

## 2019-04-04 VITALS — BP 107/67 | HR 88 | Wt 270.0 lb

## 2019-04-04 DIAGNOSIS — I10 Essential (primary) hypertension: Secondary | ICD-10-CM

## 2019-04-04 DIAGNOSIS — I5032 Chronic diastolic (congestive) heart failure: Secondary | ICD-10-CM

## 2019-04-04 DIAGNOSIS — Z720 Tobacco use: Secondary | ICD-10-CM

## 2019-04-04 NOTE — Progress Notes (Signed)
Virtual Visit via Telephone Note   Evaluation Performed:  Follow-up visit  This visit type was conducted due to national recommendations for restrictions regarding the COVID-19 Pandemic (e.g. social distancing).  This format is felt to be most appropriate for this patient at this time.  All issues noted in this document were discussed and addressed.  No physical exam was performed (except for noted visual exam findings with Video Visits).  Please refer to the patient's chart (MyChart message for video visits and phone note for telephone visits) for the patient's consent to telehealth for [facility]  Date:  04/04/2019   ID:  Chelsea Ramsey, DOB 19-Mar-1966, MRN [id number]  Patient Location:  [name] [place] [address]   Provider location:   [facility] [address], [place] [address]  PCP:  Kathee Delton, MD  Cardiologist: Isaias Cowman, MD Electrophysiologist:  None   Chief Complaint:  Shortness of breath  History of Present Illness:    Chelsea Ramsey is a 53 y.o. female who presents via audio/video conferencing for a telehealth visit today.  Patient verified DOB and address.  The patient does not have symptoms concerning for COVID-19 infection (fever, chills, cough, or new SHORTNESS OF BREATH).   Patient reports minimal shortness of breath upon moderate exertion. She describes this as chronic in nature having been present for several years. She has associated head congestion, rhinorrhea, fatigue and chronic back pain along with this. She denies any dizziness, swelling in legs/ abdomen, palpitations, chest pain, cough, difficulty sleeping or weight gain. Does wear oxygen at 3L at night and is waiting on sleep study to be scheduled once COVID-19 restrictions are lifted.   Prior CV studies:   The following studies were reviewed today:  Echo report from 11/05/18 reviewed and showed an EF of 50-55%  along with mild/ moderate AR, mild MR and moderate TR.   Past Medical History:  Diagnosis Date  . Acute drug-induced gout of left foot 03/01/2018   Last Assessment & Plan:  Likely at least partially brought on by diuresis.  Needs to continue to diurese  Will push hydration Stop allopurinol given initiation during acute flare may worsen this, re-broach this when asymptomatic Avoid nsaids given stomach pain Trial colchicine Add acetaminophen Ice, elevate, rest  . Allergy    seasonal  . Anxiety   . Arthritis    Right Knee  . Asthma   . CHF (congestive heart failure) ([place])   . COPD (chronic obstructive pulmonary disease) ([place])   . Coronary artery disease    Leaky heart valve  . Fibromyalgia   . GERD (gastroesophageal reflux disease)   . Hypertension   . Pneumonia   . PUD (peptic ulcer disease)   . Pulmonary HTN ([place])   . Rheumatic fever/heart disease   . Sleep apnea    Past Surgical History:  Procedure Laterality Date  . ADENOIDECTOMY    . ESOPHAGOGASTRODUODENOSCOPY (EGD) WITH PROPOFOL N/A 05/25/2018   Procedure: ESOPHAGOGASTRODUODENOSCOPY (EGD) WITH PROPOFOL;  Surgeon: Lucilla Lame, MD;  Location[facility] ENDOSCOPY;  Service: Endoscopy;  Laterality: N/A;  . MITRAL VALVE REPLACEMENT    . MULTIPLE TOOTH EXTRACTIONS    . TONSILLECTOMY    . TOTAL KNEE ARTHROPLASTY Right 09/04/2016   Procedure: TOTAL KNEE ARTHROPLASTY; with lateral release;  Surgeon: Earlie Server, MD;  Location: [place];  Service: Orthopedics;  Laterality: Right;     Current Meds  Medication Sig  . allopurinol (  ZYLOPRIM) 100 MG tablet Take 100 mg by mouth daily.  . budesonide-formoterol (SYMBICORT) 160-4.5 MCG/ACT inhaler Inhale 2 puffs into the lungs 2 (two) times daily.  . butalbital-acetaminophen-caffeine (FIORICET, ESGIC) 50-325-40 MG tablet Take 1 tablet by mouth every 6 (six) hours as needed.   . Calcium Carbonate-Vit D-Min (GNP CALCIUM 1200) 1200-1000 MG-UNIT CHEW Chew 1,200 mg by mouth daily with breakfast. Take in  combination with vitamin D and magnesium.  . Cholecalciferol (VITAMIN D3) 125 MCG (5000 UT) CAPS Take 1 capsule (5,000 Units total) by mouth daily with breakfast. Take along with calcium and magnesium.  . furosemide (LASIX) 40 MG tablet Take 1 tablet (40 mg total) by mouth 2 (two) times daily.  Marland Kitchen gabapentin (NEURONTIN) 600 MG tablet Take 0.5 tablets (300 mg total) by mouth 2 (two) times daily. (Patient taking differently: Take 600 mg by mouth 3 (three) times daily. )  . guaiFENesin (MUCINEX) 600 MG 12 hr tablet Take 1 tablet (600 mg total) by mouth 2 (two) times daily.  Marland Kitchen lactulose (CHRONULAC) 10 GM/15ML solution Take 45 mLs (30 g total) by mouth 2 (two) times daily as needed for mild constipation.  . magnesium oxide (MAG-OX) 400 MG tablet Take 400 mg by mouth daily.   . meclizine (ANTIVERT) 12.5 MG tablet Take 25 mg by mouth 3 (three) times daily as needed for dizziness.  . metolazone (ZAROXOLYN) 2.5 MG tablet Take 2.5 mg by mouth. Twice weekly  . metoprolol tartrate (LOPRESSOR) 100 MG tablet Take 1 tablet (100 mg total) by mouth 2 (two) times daily.  . nitroGLYCERIN (NITROSTAT) 0.4 MG SL tablet Place 1 tablet (0.4 mg total) under the tongue every 5 (five) minutes as needed for chest pain.  . nortriptyline (PAMELOR) 10 MG capsule Take 30 mg by mouth at bedtime.  Marland Kitchen omeprazole (PRILOSEC) 40 MG capsule Take 40 mg by mouth 2 (two) times daily.  Derrill Memo ON 05/19/2019] Oxycodone HCl 10 MG TABS Take 0.5-1 tablets (5-10 mg total) by mouth 2 (two) times daily as needed for up to 30 days. Must last 30 days. MAX.: 2/day  . potassium chloride SA (K-DUR,KLOR-CON) 20 MEQ tablet Take 1 tablet (20 mEq total) by mouth daily. And additional tablet when taking metolazone  . promethazine (PHENERGAN) 12.5 MG tablet Take 1 tablet (12.5 mg total) by mouth every 6 (six) hours as needed for nausea or vomiting.  . rivaroxaban (XARELTO) 20 MG TABS tablet Take 20 mg by mouth every evening.   Marland Kitchen tiZANidine (ZANAFLEX) 4 MG  tablet Take 4 mg by mouth at bedtime.      Allergies:   Amiodarone; Aspirin; Flexeril [cyclobenzaprine]; Trazamine [trazodone & diet manage prod]; Codeine; and Tramadol   Social History   Tobacco Use  . Smoking status: Current Every Day Smoker    Packs/day: 0.10    Types: Cigarettes  . Smokeless tobacco: Never Used  . Tobacco comment: 4/20 was not able to quit.  Still at 1-3 a day. She would like to wait to set a new quit date until we get back to class to have the extra in person support  Substance Use Topics  . Alcohol use: Not Currently    Alcohol/week: 3.0 standard drinks    Types: 3 Cans of beer per week    Comment: 16 oz per week  . Drug use: Not Currently    Comment: Previous use of cocaine and marijuana last use 07/31/16      Family Hx: The patient's family history includes Asthma in her  mother; COPD in her mother; Cancer in her mother; Congestive Heart Failure in her father.  ROS:   Please see the history of present illness.     All other systems reviewed and are negative.   Labs/Other Tests and Data Reviewed:    Recent Labs: 04/23/2018: TSH 3.234 12/01/2018: ALT 20; B Natriuretic Peptide 259.0 12/03/2018: Magnesium 2.1 12/27/2018: BUN 12; Creatinine, Ser 0.59; Hemoglobin 10.2; Platelets 323; Potassium 3.7; Sodium 136   Recent Lipid Panel Lab Results  Component Value Date/Time   CHOL 96 04/03/2014   CHOL 91 12/09/2013 04:27 AM   TRIG 63 04/03/2014   TRIG 50 12/09/2013 04:27 AM   HDL 32 (A) 04/03/2014   HDL 31 (L) 12/09/2013 04:27 AM   LDLCALC 51 04/03/2014   LDLCALC 50 12/09/2013 04:27 AM    Wt Readings from Last 3 Encounters:  04/04/19 270 lb (122.5 kg)  02/23/19 273 lb (123.8 kg)  02/08/19 271 lb (122.9 kg)     Exam:    Vital Signs:  BP 107/67 Comment: self-reported  Pulse 88 Comment: self-reported  Wt 270 lb (122.5 kg) Comment: self-reported  LMP 11/08/2009 Comment: menopause  SpO2 95% Comment: self-reported  BMI 46.35 kg/m    Well nourished,  well developed female in no  acute distress.   ASSESSMENT & PLAN:    1. Chronic heart failure with preserved ejection fraction- - NYHA class II - euvolemic today per patient's description of symptoms - weighing daily and home weight ranges from 270-274 lbs. Reminded to call for an overnight weight gain of >2 pounds or a weekly weight gain of >5 pounds - does have metolazone that she takes twice weekly and she does take an extra potassium tablet when she takes it - not adding salt and has been trying to eat low sodium.   - saw cardiology ([place]) 01/03/2019 & has telemedicine visit with him later today - BNP 12/01/2018 was 259.0  2: HTN- - self- reported BP at home today was 107/67 - did not get established with PCP locally yet due to COVID-19 restrictions - BMP from 01/16/2019 reviewed and showed sodium 139, potassium 3.5, creatinine 0.6 and GFR 128  3: Tobacco use- - patient endorses smoking ~ 5 cigarettes daily and says that she's smoking more due to being stuck inside her home - complete cessation discussed for 3 minutes with her  COVID-19 Education: The signs and symptoms of COVID-19 were discussed with the patient and how to seek care for testing (follow up with PCP or arrange E-visit).  The importance of social distancing was discussed today.  Patient Risk:   After full review of this patients clinical status, I feel that they are at least moderate risk at this time.  Time:   Today, I have spent 12 minutes with the patient with telehealth technology discussing medications, diet, weight and symptoms to report.     Medication Adjustments/Labs and Tests Ordered: Current medicines are reviewed at length with the patient today.  Concerns regarding medicines are outlined above.   Tests Ordered: No orders of the defined types were placed in this encounter.  Medication Changes: No orders of the defined types were placed in this encounter.   Disposition:  Follow-up in 6 weeks  or sooner for any questions/problems before then.   Signed, Alisa Graff, FNP  04/04/2019 10:21 AM    [facility]

## 2019-04-04 NOTE — Patient Instructions (Signed)
Continue weighing daily and call for an overnight weight gain of > 2 pounds or a weekly weight gain of >5 pounds. 

## 2019-04-19 NOTE — Progress Notes (Signed)
Counselor contacted patient for ongoing follow-up support.  Patient reported main concern regards news of aunt's diagnosis with COVID-19 and her concern for her health.  Patient appreciative of ongoing follow up.  Counselor to check in next month.

## 2019-05-08 ENCOUNTER — Other Ambulatory Visit: Payer: Self-pay | Admitting: Family

## 2019-05-08 MED ORDER — PROMETHAZINE HCL 12.5 MG PO TABS: 12.5000 mg | ORAL_TABLET | Freq: Four times a day (QID) | ORAL | 1 refills | Status: DC | PRN

## 2019-05-15 ENCOUNTER — Telehealth: Payer: Self-pay

## 2019-05-15 NOTE — Telephone Encounter (Signed)
   TELEPHONE CALL NOTE  This patient has been deemed a candidate for follow-up tele-health visit to limit community exposure during the Covid-19 pandemic. I spoke with the patient via phone to discuss instructions. The patient was advised to review the section on consent for treatment as well. The patient will receive a phone call 2-3 days prior to their E-Visit at which time consent will be verbally confirmed. A Virtual Office Visit appointment type has been scheduled for 05/16/2019 with Darylene Price FNP.  Gaylord Shih, CMA 05/15/2019 1:54 PM

## 2019-05-15 NOTE — Telephone Encounter (Signed)
TELEPHONE CALL NOTE  Chelsea Ramsey has been deemed a candidate for a follow-up tele-health visit to limit community exposure during the Covid-19 pandemic. I spoke with the patient via phone to ensure availability of phone/video source, confirm preferred email & phone number, discuss instructions and expectations, and review consent.   I reminded Chelsea Ramsey to be prepared with any vital sign and/or heart rhythm information that could potentially be obtained via home monitoring, at the time of her visit.  Finally, I reminded Chelsea Ramsey to expect an e-mail containing a link for their video-based visit approximately 15 minutes before her visit, or alternatively, a phone call at the time of her visit if her visit is planned to be a phone encounter.  Did the patient verbally consent to treatment as below? YES  Chelsea Ramsey, CMA 05/15/2019 1:55 PM  CONSENT FOR TELE-HEALTH VISIT - PLEASE REVIEW  I hereby voluntarily request, consent and authorize [facility] and its employed or contracted physicians, physician assistants, nurse practitioners or other licensed health care professionals (the Practitioner), to provide me with telemedicine health care services (the "Services") as deemed necessary by the treating Practitioner. I acknowledge and consent to receive the Services by the Practitioner via telemedicine. I understand that the telemedicine visit will involve communicating with the Practitioner through telephonic communication technology and the disclosure of certain medical information by electronic transmission. I acknowledge that I have been given the opportunity to request an in-person assessment or other available alternative prior to the telemedicine visit and am voluntarily participating in the telemedicine visit.  I understand that I have the right to withhold or withdraw my consent to the use of telemedicine in the course of my care at  any time, without affecting my right to future care or treatment, and that the Practitioner or I may terminate the telemedicine visit at any time. I understand that I have the right to inspect all information obtained and/or recorded in the course of the telemedicine visit and may receive copies of available information for a reasonable fee.  I understand that some of the potential risks of receiving the Services via telemedicine include:  Chelsea Ramsey Delay or interruption in medical evaluation due to technological equipment failure or disruption; . Information transmitted may not be sufficient (e.g. poor resolution of images) to allow for appropriate medical decision making by the Practitioner; and/or  . In rare instances, security protocols could fail, causing a breach of personal health information.  Furthermore, I acknowledge that it is my responsibility to provide information about my medical history, conditions and care that is complete and accurate to the best of my ability. I acknowledge that Practitioner's advice, recommendations, and/or decision may be based on factors not within their control, such as incomplete or inaccurate data provided by me or lack of visual representation. I understand that the practice of medicine is not an exact science and that Practitioner makes no warranties or guarantees regarding treatment outcomes. I acknowledge that I will receive a copy of this consent concurrently upon execution via email to the email address I last provided but may also request a printed copy by calling the office of [facility].    I understand that my insurance may be billed for this visit.   I have read or had this consent read to me. . I understand the contents of this consent, which adequately explains the benefits and risks of the Services being provided via telemedicine.  Chelsea Ramsey  I have been provided ample opportunity to ask questions regarding this consent and the Services and have had my  questions answered to my satisfaction. . I give my informed consent for the services to be provided through the use of telemedicine in my medical care  By participating in this telemedicine visit I agree to the above.

## 2019-05-16 ENCOUNTER — Other Ambulatory Visit: Payer: Self-pay

## 2019-05-16 ENCOUNTER — Other Ambulatory Visit: Payer: Self-pay | Admitting: Family

## 2019-05-16 ENCOUNTER — Encounter: Payer: Self-pay | Admitting: Family

## 2019-05-16 ENCOUNTER — Ambulatory Visit: Payer: Medicaid Other | Attending: Family | Admitting: Family

## 2019-05-16 VITALS — BP 110/72 | HR 74 | Wt 275.4 lb

## 2019-05-16 DIAGNOSIS — Z720 Tobacco use: Secondary | ICD-10-CM

## 2019-05-16 DIAGNOSIS — R0683 Snoring: Secondary | ICD-10-CM

## 2019-05-16 DIAGNOSIS — I5032 Chronic diastolic (congestive) heart failure: Secondary | ICD-10-CM

## 2019-05-16 DIAGNOSIS — I10 Essential (primary) hypertension: Secondary | ICD-10-CM

## 2019-05-16 MED ORDER — FUROSEMIDE 40 MG PO TABS: ORAL_TABLET | ORAL | 5 refills | Status: DC

## 2019-05-16 NOTE — Progress Notes (Signed)
Virtual Visit via Telephone Note   Evaluation Performed:  Follow-up visit  This visit type was conducted due to national recommendations for restrictions regarding the COVID-19 Pandemic (e.g. social distancing).  This format is felt to be most appropriate for this patient at this time.  All issues noted in this document were discussed and addressed.  No physical exam was performed (except for noted visual exam findings with Video Visits).  Please refer to the patient's chart (MyChart message for video visits and phone note for telephone visits) for the patient's consent to telehealth for [facility]  Date:  05/16/2019   ID:  Chelsea Ramsey Record, DOB 1966-03-03, MRN [id number]  Patient Location:  [address] [place] [address]   Provider location:   [facility] [place] [address] [place] [address] [place], [place] [address]  PCP:  Kathee Delton, MD  Cardiologist:  Isaias Cowman, MD Electrophysiologist:  None   Chief Complaint:  Shortness of breath  History of Present Illness:    Chelsea Ramsey is a 53 y.o. female who presents via audio/video conferencing for a telehealth visit today.  Patient verified DOB and address.  The patient does not have symptoms concerning for COVID-19 infection (fever, chills, cough, or new SHORTNESS OF BREATH).   Patient reports moderate shortness of breath upon minimal exertion. She describes this as chronic in nature having been present for several years although she does feel like it's a little worse recently. She has associated dizziness, abdominal distention, wheezing, fatigue and fluctuating weight. She denies any chest pain, palpitations, or cough. Continues to experience frequent episodes of nausea. Has been taking metolazone a few times a week but doesn't feel like it's working like it previously was. Did recently eat BBQ ribs for Sunday dinner.   She says that she was supposed to get a sleep study  done at [facility] but she'd like to have it done locally. She does endorse snoring at night and also wakes herself up snorting and family has noticed apneic episodes as well.   Prior CV studies:   The following studies were reviewed today:  Echo report from 11/05/18 reviewed and showed an EF of 50-55% along with mild/moderate MR, moderate TR, mild AR with bioprosthesis mitral valve present.   Past Medical History:  Diagnosis Date  . Acute drug-induced gout of left foot 03/01/2018   Last Assessment & Plan:  Likely at least partially brought on by diuresis.  Needs to continue to diurese  Will push hydration Stop allopurinol given initiation during acute flare may worsen this, re-broach this when asymptomatic Avoid nsaids given stomach pain Trial colchicine Add acetaminophen Ice, elevate, rest  . Allergy    seasonal  . Anxiety   . Arthritis    Right Knee  . Asthma   . CHF (congestive heart failure) ([place])   . COPD (chronic obstructive pulmonary disease) ([place])   . Coronary artery disease    Leaky heart valve  . Fibromyalgia   . GERD (gastroesophageal reflux disease)   . Hypertension   . Pneumonia   . PUD (peptic ulcer disease)   . Pulmonary HTN ([place])   . Rheumatic fever/heart disease   . Sleep apnea    Past Surgical History:  Procedure Laterality Date  . ADENOIDECTOMY    . ESOPHAGOGASTRODUODENOSCOPY (EGD) WITH PROPOFOL N/A 05/25/2018   Procedure: ESOPHAGOGASTRODUODENOSCOPY (EGD) WITH PROPOFOL;  Surgeon: Lucilla Lame, MD;  Location: [facility] ENDOSCOPY;  Service: Endoscopy;  Laterality: N/A;  . MITRAL  VALVE REPLACEMENT    . MULTIPLE TOOTH EXTRACTIONS    . TONSILLECTOMY    . TOTAL KNEE ARTHROPLASTY Right 09/04/2016   Procedure: TOTAL KNEE ARTHROPLASTY; with lateral release;  Surgeon: Earlie Server, MD;  Location: [facility];  Service: Orthopedics;  Laterality: Right;     Current Meds  Medication Sig  . albuterol (PROAIR HFA) 108 (90 Base) MCG/ACT inhaler Inhale 1-2 puffs into the lungs every 6 (six)  hours as needed for wheezing or shortness of breath.  . allopurinol (ZYLOPRIM) 100 MG tablet Take 100 mg by mouth daily.  Marland Kitchen ALPRAZolam (XANAX) 0.25 MG tablet Take 0.25 mg by mouth 2 (two) times daily as needed for anxiety.  . budesonide-formoterol (SYMBICORT) 160-4.5 MCG/ACT inhaler Inhale 2 puffs into the lungs 2 (two) times daily.  . butalbital-acetaminophen-caffeine (FIORICET, ESGIC) 50-325-40 MG tablet Take 1 tablet by mouth every 6 (six) hours as needed.   . Calcium Carbonate-Vit D-Min (GNP CALCIUM 1200) 1200-1000 MG-UNIT CHEW Chew 1,200 mg by mouth daily with breakfast. Take in combination with vitamin D and magnesium.  . colchicine 0.6 MG tablet Take 0.6 mg by mouth daily. As directed  . docusate sodium (COLACE) 100 MG capsule Take 200 mg by mouth daily.  . furosemide (LASIX) 40 MG tablet Take 1 tablet (40 mg total) by mouth 2 (two) times daily. (Patient taking differently: Take 40 mg by mouth 2 (two) times daily. 80mg  AM/ 40mg  PM)  . gabapentin (NEURONTIN) 600 MG tablet Take 0.5 tablets (300 mg total) by mouth 2 (two) times daily. (Patient taking differently: Take 600 mg by mouth 3 (three) times daily. )  . ipratropium-albuterol (DUONEB) 0.5-2.5 (3) MG/3ML SOLN Inhale 3 mLs into the lungs 3 (three) times daily as needed (respiratory).   . magnesium oxide (MAG-OX) 400 MG tablet Take 400 mg by mouth daily.   . meclizine (ANTIVERT) 12.5 MG tablet Take 25 mg by mouth 3 (three) times daily as needed for dizziness.  . metolazone (ZAROXOLYN) 2.5 MG tablet Take 2.5 mg by mouth. Twice weekly  . metoprolol tartrate (LOPRESSOR) 100 MG tablet Take 1 tablet (100 mg total) by mouth 2 (two) times daily.  . nitroGLYCERIN (NITROSTAT) 0.4 MG SL tablet Place 1 tablet (0.4 mg total) under the tongue every 5 (five) minutes as needed for chest pain.  . nortriptyline (PAMELOR) 10 MG capsule Take 40 mg by mouth at bedtime.   Derrill Memo ON 05/19/2019] Oxycodone HCl 10 MG TABS Take 0.5-1 tablets (5-10 mg total) by mouth  2 (two) times daily as needed for up to 30 days. Must last 30 days. MAX.: 2/day  . pantoprazole (PROTONIX) 40 MG tablet Take 40 mg by mouth 2 (two) times daily.  . potassium chloride SA (K-DUR,KLOR-CON) 20 MEQ tablet Take 1 tablet (20 mEq total) by mouth daily. And additional tablet when taking metolazone  . promethazine (PHENERGAN) 12.5 MG tablet Take 1 tablet (12.5 mg total) by mouth every 6 (six) hours as needed for nausea or vomiting.  . rivaroxaban (XARELTO) 20 MG TABS tablet Take 20 mg by mouth every evening.      Allergies:   Amiodarone, Aspirin, Flexeril [cyclobenzaprine], Trazamine [trazodone & diet manage prod], Codeine, and Tramadol   Social History   Tobacco Use  . Smoking status: Current Every Day Smoker    Packs/day: 0.10    Types: Cigarettes  . Smokeless tobacco: Never Used  . Tobacco comment: 4/20 was not able to quit.  Still at 1-3 a day. She would like to wait to  set a new quit date until we get back to class to have the extra in person support  Substance Use Topics  . Alcohol use: Not Currently    Alcohol/week: 3.0 standard drinks    Types: 3 Cans of beer per week    Comment: 16 oz per week  . Drug use: Not Currently    Comment: Previous use of cocaine and marijuana last use 07/31/16      Family Hx: The patient's family history includes Asthma in her mother; COPD in her mother; Cancer in her mother; Congestive Heart Failure in her father.  ROS:   Please see the history of present illness.     All other systems reviewed and are negative.   Labs/Other Tests and Data Reviewed:    Recent Labs: 12/01/2018: ALT 20; B Natriuretic Peptide 259.0 12/03/2018: Magnesium 2.1 12/27/2018: BUN 12; Creatinine, Ser 0.59; Hemoglobin 10.2; Platelets 323; Potassium 3.7; Sodium 136   Recent Lipid Panel Lab Results  Component Value Date/Time   CHOL 96 04/03/2014   CHOL 91 12/09/2013 04:27 AM   TRIG 63 04/03/2014   TRIG 50 12/09/2013 04:27 AM   HDL 32 (A) 04/03/2014   HDL 31 (L)  12/09/2013 04:27 AM   LDLCALC 51 04/03/2014   LDLCALC 50 12/09/2013 04:27 AM    Wt Readings from Last 3 Encounters:  05/16/19 275 lb 6 oz (124.9 kg)  04/04/19 270 lb (122.5 kg)  02/23/19 273 lb (123.8 kg)     Exam:    Vital Signs:  BP 110/72 Comment: self-reported  Pulse 74 Comment: self-reported  Wt 275 lb 6 oz (124.9 kg) Comment: self-reported  LMP 11/08/2009 Comment: menopause  SpO2 95% Comment: self-reported  BMI 47.27 kg/m    Well nourished, well developed female in no  acute distress.   ASSESSMENT & PLAN:    1. Chronic heart failure with preserved ejection fraction- - NYHA class III - mild fluid overloaded today per patient's description of symptoms - weighing daily and home weight ranges from 271-276 lbs. Reminded to call for an overnight weight gain of >2 pounds or a weekly weight gain of >5 pounds - does have metolazone that she takes twice weekly and she does take an extra potassium tablet when she takes it - will increase her daily furosemide to 80mg  AM/ 40mg  PM (was 40mg  BID). Advised patient to call us back if this doesn't help as we may need to change her diuretic completely to torsemide.  - not adding salt and has been trying to eat low sodium.   - had telemedicine visit with cardiology ([facility]) 04/04/2019 - BNP 12/01/2018 was 259.0 - wearing oxygen at 3L at night  2: HTN- - self- reported BP at home looked good - has not gotten established with PCP locally yet due to COVID-19 restrictions so continues with [facility] - BMP from 01/16/2019 reviewed and showed sodium 139, potassium 3.5, creatinine 0.6 and GFR 128  3: Tobacco use- - patient endorses smoking ~ 5 cigarettes daily and says that she's smoking more due to being stuck inside her home - complete cessation discussed for 3 minutes with her   4: Snoring- - patient endorses snoring, apneic episodes and fatigue - she also says that her oxygen level gets low at times - was supposed to have a sleep study done  at [facility] but would like it done locally. Will send referral for sleep study to rule out sleep apnea  COVID-19 Education: The signs and symptoms of COVID-19 were discussed  with the patient and how to seek care for testing (follow up with PCP or arrange E-visit).  The importance of social distancing was discussed today.  Patient Risk:   After full review of this patients clinical status, I feel that they are at least moderate risk at this time.  Time:   Today, I have spent 20 minutes with the patient with telehealth technology discussing medications, diet and symptoms to report.     Medication Adjustments/Labs and Tests Ordered: Current medicines are reviewed at length with the patient today.  Concerns regarding medicines are outlined above.   Tests Ordered: No orders of the defined types were placed in this encounter.  Medication Changes: No orders of the defined types were placed in this encounter.   Disposition:  Follow-up in 2 months or sooner for any questions/problems before then.   Signed, Alisa Graff, FNP  05/16/2019 10:30 AM    [facility]

## 2019-05-16 NOTE — Patient Instructions (Addendum)
Continue weighing daily and call for an overnight weight gain of > 2 pounds or a weekly weight gain of >5 pounds.  Increase morning dose of furosemide to 80mg  and continue taking 40mg  in the afternoon. If this doesn't help, call us back as we may need to change your diuretic to something different.

## 2019-05-17 NOTE — Progress Notes (Signed)
Counselor contacted patient regarding continued support.  Patient maintains connection with family and friends via Internet and telephone.  Patient reports that she has begun sleeping during the day which impacts ability to sleep at night.  Conversation regarding patient self-care; patient awaiting response from primary care provider.  Client open to ongoing support; prefers contact after 3 pm to be able to have more open conversation based on family work schedule.

## 2019-05-22 ENCOUNTER — Encounter: Payer: Self-pay | Admitting: *Deleted

## 2019-06-01 ENCOUNTER — Encounter: Payer: Self-pay | Admitting: Pain Medicine

## 2019-06-04 NOTE — Progress Notes (Signed)
Pain Management Virtual Encounter Note - Virtual Visit via Telephone Telehealth (real-time audio visits between healthcare provider and patient).   Patient's Phone No. & Preferred Pharmacy:  [phone] (home); [phone] (mobile); (Preferred) [phone] No e-mail address on [facility] [place] [facility] [place] [address] [place] [address] Phone: [phone] Fax: [phone]    Pre-screening note:  Our staff contacted Chelsea Ramsey and offered her an "in person", "face-to-face" appointment versus a telephone encounter. She indicated preferring the telephone encounter, at this time.   Reason for Virtual Visit: COVID-19*  Social distancing based on CDC and AMA recommendations.   I contacted Chelsea Ramsey on 06/05/2019 via telephone.      I clearly identified myself as Gaspar Cola, MD. I verified that I was speaking with the correct person using two identifiers (Name: Chelsea Ramsey, and date of birth: 31-Aug-1966).  Advanced Informed Consent I sought verbal advanced consent from Chelsea Ramsey for virtual visit interactions. I informed Chelsea Ramsey of possible security and privacy concerns, risks, and limitations associated with providing "not-in-person" medical evaluation and management services. I also informed Chelsea Ramsey of the availability of "in-person" appointments. Finally, I informed her that there would be a charge for the virtual visit and that she could be  personally, fully or partially, financially responsible for it. Chelsea Ramsey expressed understanding and agreed to proceed.   Historic Elements   Chelsea Ramsey is a 53 y.o. year old, female patient evaluated today after her last encounter by our practice on 03/07/2019. Chelsea Ramsey  has a past medical history of Acute drug-induced gout of left foot (03/01/2018), Allergy, Anxiety, Arthritis, Asthma, CHF (congestive heart failure)  ([place]), COPD (chronic obstructive pulmonary disease) ([place]), Coronary artery disease, Fibromyalgia, GERD (gastroesophageal reflux disease), Hypertension, Pneumonia, PUD (peptic ulcer disease), Pulmonary HTN ([place]), Rheumatic fever/heart disease, and Sleep apnea. 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 Chelsea Ramsey has a current medication list which includes the following prescription(s): albuterol, allopurinol, alprazolam, budesonide-formoterol, butalbital-acetaminophen-caffeine, d 5000, colchicine, docusate sodium, furosemide, gabapentin, galcanezumab-gnlm, guaifenesin, lactulose, magnesium oxide, meclizine, metolazone, metoprolol tartrate, metoprolol tartrate, nitroglycerin, nortriptyline, oxycodone hcl, oxycodone hcl, oxycodone hcl, pantoprazole, potassium chloride sa, promethazine, rivaroxaban, gnp calcium 1200, and ipratropium-albuterol. She  reports that she has been smoking cigarettes. She has been smoking about 0.10 packs per day. She has never used smokeless tobacco. She reports previous alcohol use of about 3.0 standard drinks of alcohol per week. She reports previous drug use. Chelsea Ramsey is allergic to amiodarone; aspirin; flexeril [cyclobenzaprine]; trazamine [name] & diet manage prod]; codeine; and tramadol.   HPI  Today, she is being contacted for medication management.  The patient refers that she has been in bed for a while secondary to her gout.  I had her taking allopurinol 100 mg p.o. daily and she was recently given colchicine 0.6 mg daily, which seem to have helped to some degree.  She appears not to be very compliant with her diet and today we have gone over that again.  I have provided her with some written information on the after visit summary on "My Chart".  Today I will  increase her allopurinol to 100 mg p.o. twice daily and keep her on the colchicine on a daily basis.  Pharmacotherapy Assessment  Analgesic: Oxycodone 10mg  BID MME/day: 30 mg/day.   Monitoring: Pharmacotherapy:  No side-effects or adverse reactions reported. [place] PMP: PDMP reviewed during this encounter.       Compliance: No problems identified. Effectiveness: Clinically acceptable. Plan: Refer to "POC".  Pertinent Labs   SAFETY SCREENING Profile Lab Results  Component Value Date   STAPHAUREUS NEGATIVE 08/24/2016   MRSAPCR NEGATIVE 08/24/2016   HIV Non Reactive 12/02/2018   PREGTESTUR NEGATIVE 05/22/2018   Renal Function Lab Results  Component Value Date   BUN 12 12/27/2018   CREATININE 0.59 12/27/2018   BCR 18 10/10/2018   GFRAA >60 12/27/2018   GFRNONAA >60 12/27/2018   Hepatic Function Lab Results  Component Value Date   AST 18 12/01/2018   ALT 20 12/01/2018   ALBUMIN 3.7 12/01/2018   UDS Summary  Date Value Ref Range Status  02/08/2019 FINAL  Final    Comment:    ==================================================================== TOXASSURE SELECT 13 (MW) ==================================================================== Test                             Result       Flag       Units Drug Present not Declared for Prescription Verification   Oxycodone                      760          UNEXPECTED ng/mg creat   Oxymorphone                    1283         UNEXPECTED ng/mg creat   Noroxycodone                   1082         UNEXPECTED ng/mg creat   Noroxymorphone                 378          UNEXPECTED ng/mg creat    Sources of oxycodone are scheduled prescription medications.    Oxymorphone, noroxycodone, and noroxymorphone are expected    metabolites of oxycodone. Oxymorphone is also available as a    scheduled prescription medication. Drug Absent but Declared for Prescription Verification   Butalbital                     Not Detected  UNEXPECTED ==================================================================== Test                      Result    Flag   Units      Ref Range   Creatinine              107              mg/dL      >=20 ==================================================================== Declared Medications:  The flagging and interpretation on this report are based on the  following declared medications.  Unexpected results may arise from  inaccuracies in the declared medications.  **Note: The testing scope of this panel includes these medications:  Butalbital (Fioricet)  **Note: The testing scope of this panel does not include following  reported medications:  Acetaminophen (Fioricet)  Albuterol (Duoneb)  Allopurinol  Bisacodyl  Budesonide (Symbicort)  Caffeine (Fioricet)  Calcium (Calcium/Vitamin D)  Cholecalciferol  Colchicine  Formoterol (Symbicort)  Furosemide  Gabapentin  Guaifenesin  Ipratropium (Duoneb)  Lactulose  Vitamin D (Calcium/Vitamin D) ==================================================================== For clinical consultation, please call [phone]. ====================================================================  Note: Above Lab results reviewed.  Recent imaging  MM 3D SCREEN BREAST BILATERAL CLINICAL DATA:  Screening.  EXAM: DIGITAL SCREENING BILATERAL MAMMOGRAM WITH TOMO AND CAD  COMPARISON:  Previous exam(s).  ACR Breast Density Category b: There are scattered areas of fibroglandular density.  FINDINGS: There are no findings suspicious for malignancy. Images were processed with CAD.  IMPRESSION: No mammographic evidence of malignancy. A result letter of this screening mammogram will be mailed directly to the patient.  RECOMMENDATION: Screening mammogram in one year. (Code:SM-B-01Y)  BI-RADS CATEGORY  1: Negative.  Electronically Signed   By: Margarette Canada M.D.   On: 01/27/2019 13:46  Assessment  The primary encounter diagnosis was  Chronic pain syndrome. 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  Plan of Care  I have discontinued Chelsea Ramsey's Oxycodone HCl, Oxycodone HCl, metoprolol succinate, and Colchicine. I have also changed her Oxycodone HCl, allopurinol, and colchicine. Additionally, I am having her start on Oxycodone HCl and Oxycodone HCl. Lastly, I am having her maintain her ipratropium-albuterol, budesonide-formoterol, rivaroxaban, magnesium oxide, lactulose, nitroGLYCERIN, meclizine, guaiFENesin, gabapentin, potassium chloride SA, butalbital-acetaminophen-caffeine, metoprolol tartrate, nortriptyline, metolazone, promethazine, albuterol, ALPRAZolam, pantoprazole, docusate sodium, furosemide, D 5000, Galcanezumab-gnlm, metoprolol tartrate, and GNP Calcium 1200.  Pharmacotherapy (Medications Ordered): Meds ordered this encounter  Medications  . Oxycodone HCl 10 MG TABS    Sig: Take 0.5-1 tablets (5-10 mg total) by mouth 2 (two) times daily as needed. Must last 30 days. MAX.: 2/day    Dispense:  60 tablet    Refill:  0    Chronic Pain: STOP Act (Not applicable) Fill 1 day early if closed on refill date. Do not fill until: 06/18/2019. To last until: 07/18/2019. Avoid benzodiazepines within 8 hours of opioids  . Calcium Carbonate-Vit D-Min (GNP CALCIUM 1200) 1200-1000 MG-UNIT CHEW    Sig: Chew 1,200 mg by mouth daily with breakfast. Take in combination with vitamin D and magnesium.    Dispense:  90 tablet    Refill:  0    Fill one day early if pharmacy is closed on scheduled refill date. May substitute for generic if available.  . Oxycodone HCl  10 MG TABS    Sig: Take 0.5-1 tablets (5-10 mg total) by mouth 2 (two) times daily as needed. Must last 30 days. MAX.: 2/day    Dispense:  60 tablet    Refill:  0    Chronic Pain: STOP Act (Not applicable) Fill 1 day early if closed on refill date. Do not fill until: 07/18/2019. To last until: 08/17/2019. Avoid benzodiazepines within 8 hours of opioids  . Oxycodone HCl 10 MG TABS    Sig: Take 0.5-1 tablets (5-10 mg total) by mouth 2 (two) times daily as needed. Must last 30 days. MAX.: 2/day    Dispense:  60 tablet    Refill:  0    Chronic Pain: STOP Act (Not applicable) Fill 1 day early if closed on refill date. Do not fill until: 08/17/2019. To last until: 09/16/2019. Avoid benzodiazepines within 8 hours of opioids  . allopurinol (ZYLOPRIM) 100 MG tablet    Sig: Take 1 tablet (100 mg total) by mouth 2 (two) times  daily.    Dispense:  60 tablet    Refill:  2    Fill one day early if pharmacy is closed on scheduled refill date. May substitute for generic if available.  . colchicine 0.6 MG tablet    Sig: Take 1 tablet (0.6 mg total) by mouth daily. As directed    Dispense:  30 tablet    Refill:  2    Fill one day early if pharmacy is closed on scheduled refill date. May substitute for generic if available.   Orders:  No orders of the defined types were placed in this encounter.  Follow-up plan:   Return in 3 months (on 09/13/2019) for (VV), E/M (MM) gout F/U, (allopurinol increase and colchicine).      Interventional management options: Considering:   NOTE: XARELTOAnticoagulation.(Stop:x 3 days prior to procedures. Re-start:6 hours after) - (NO STEROIDS- CHF) - NO RFA until BMI < 35 Diagnostic bilateral lumbar facet nerve blocks(100/100/40 w/o steroids) Diagnostic bilateral lumbar facet RFA(NO RFA until BMI <35) Diagnostic bilateral sacroiliac joint block  Possible bilateral sacroiliac joint RFA (NO RFA until BMI <35) Diagnostic right knee genicular nerve block Possible  right knee genicular nerve RFA   Palliative PRN treatment(s):   None at this time    Recent Visits Date Type Provider Dept  03/07/19 Office Visit Vevelyn Francois, NP [facility]  Showing recent visits within past 90 days and meeting all other requirements   Today's Visits Date Type Provider Dept  06/05/19 Office Visit Milinda Pointer, MD [facility]  Showing today's visits and meeting all other requirements   Future Appointments No visits were found meeting these conditions.  Showing future appointments within next 90 days and meeting all other requirements   I discussed the assessment and treatment plan with the patient. The patient was provided an opportunity to ask questions and all were answered. The patient agreed with the plan and demonstrated an understanding of the instructions.  Patient advised to call back or seek an in-person evaluation if the symptoms or condition worsens.  Total duration of non-face-to-face encounter: 14 minutes.  Note by: Gaspar Cola, MD Date: 06/05/2019; Time: 11:06 AM  Note: This dictation was prepared with Dragon dictation. Any transcriptional errors that may result from this process are unintentional.  Disclaimer:  * Given the special circumstances of the COVID-19 pandemic, the federal government has announced that the Office for Civil Rights (OCR) will exercise its enforcement discretion and will not impose penalties on physicians using telehealth in the event of noncompliance with regulatory requirements under the [place] (HIPAA) in connection with the good faith provision of telehealth during the FVCBS-49 national public health emergency. ([place])

## 2019-06-05 ENCOUNTER — Ambulatory Visit: Payer: Medicaid Other | Attending: Nurse Practitioner | Admitting: Pain Medicine

## 2019-06-05 ENCOUNTER — Other Ambulatory Visit: Payer: Self-pay

## 2019-06-05 DIAGNOSIS — G8929 Other chronic pain: Secondary | ICD-10-CM

## 2019-06-05 DIAGNOSIS — M25561 Pain in right knee: Secondary | ICD-10-CM

## 2019-06-05 DIAGNOSIS — G894 Chronic pain syndrome: Secondary | ICD-10-CM | POA: Diagnosis not present

## 2019-06-05 DIAGNOSIS — R0789 Other chest pain: Secondary | ICD-10-CM | POA: Diagnosis not present

## 2019-06-05 DIAGNOSIS — M79605 Pain in left leg: Secondary | ICD-10-CM

## 2019-06-05 DIAGNOSIS — Z96651 Presence of right artificial knee joint: Secondary | ICD-10-CM

## 2019-06-05 DIAGNOSIS — M545 Low back pain, unspecified: Secondary | ICD-10-CM

## 2019-06-05 DIAGNOSIS — M1A9XX0 Chronic gout, unspecified, without tophus (tophi): Secondary | ICD-10-CM

## 2019-06-05 DIAGNOSIS — M797 Fibromyalgia: Secondary | ICD-10-CM

## 2019-06-05 DIAGNOSIS — E559 Vitamin D deficiency, unspecified: Secondary | ICD-10-CM

## 2019-06-05 MED ORDER — OXYCODONE HCL 10 MG PO TABS: 5.0000 mg | ORAL_TABLET | Freq: Two times a day (BID) | ORAL | 0 refills | Status: DC | PRN

## 2019-06-05 MED ORDER — COLCHICINE 0.6 MG PO TABS: 0.6000 mg | ORAL_TABLET | Freq: Every day | ORAL | 2 refills | Status: DC

## 2019-06-05 MED ORDER — GNP CALCIUM 1200 1200-1000 MG-UNIT PO CHEW: 1200.0000 mg | CHEWABLE_TABLET | Freq: Every day | ORAL | 0 refills | Status: DC

## 2019-06-05 MED ORDER — ALLOPURINOL 100 MG PO TABS: 100.0000 mg | ORAL_TABLET | Freq: Two times a day (BID) | ORAL | 2 refills | Status: DC

## 2019-06-05 NOTE — Patient Instructions (Signed)
____________________________________________________________________________________________  Gout    Mechanism: Uric acid accumulation.    Uric Acid: Uric acid is a heterocyclic compound of carbon, nitrogen, oxygen, and hydrogen with the formula [id number]. It forms ions and salts known as urates and acid urates such as ammonium acid urate. Uric acid is a product of the metabolic breakdown of purine nucleotides. High blood concentrations of uric acid can lead to gout. The chemical is associated with other medical conditions including diabetes and the formation of ammonium acid urate kidney stones.    Purines: Purines are found in high concentration in meat and meat products, especially internal organs such as liver and kidney. In general, plant-based diets are low in purines. Examples of high-purine sources include: sweetbreads, anchovies, sardines, liver, beef kidneys, brains, meat extracts (e.g., Oxo, Bovril), herring, mackerel, scallops, game meats, beer (from the yeast) and gravy. A moderate amount of purine is also contained in beef, pork, poultry, other fish and seafood, asparagus, cauliflower, spinach, mushrooms, green peas, lentils, dried peas, beans, oatmeal, wheat bran, wheat germ, and hawthorn. Higher levels of meat and seafood consumption are associated with an increased risk of gout, whereas a higher level of consumption of dairy products is associated with a decreased risk. Moderate intake of purine-rich vegetables or protein is not associated with an increased risk of gout.    Causes: Uric acid is generated as the body's tissues are broken down during normal cell turnover. Some people with gout generate too much uric acid (10%). Other patients with gout do not effectively eliminate their uric acid into the urine (90%). Genetics, gender,and nutrition (alcoholism, obesity) play key roles in the development of gout.   1. If your parents have gout, then you have a 20% chance of developing it.    2. [place] people are 5 times more likely to develop gout. 3. American blacks, but not African blacks, are more likely to have gout than other populations. 4. Use of alcohol, especially beer, increases the risk for gout.   5. Diets rich in red meats, internal organs, yeast, and oily fish increase the risk for gout.   6. Uric acid levels increase at puberty in men and at menopause in women, so men first develop gout at an earlier age (20s to 8s) than do women (40s to 36s). Gout in pre-menopausal women is distinctly unusual.   7. Attacks of gouty arthritis?can be precipitated when there is a sudden change in uric acid levels.   8. Overindulgence of alcohol and red meats   9. Trauma   10. Starvation and dehydration 11. IV contrast dyes   12. Chemotherapy     Some Possible Causes of Elevated Uric Acid Levels  Medication Diuretics used for weight loss or heart disease, insulin, some antibiotics, medication for rheumatoid arthritis, or an overdose of B vitamins can cause uric acid levels to rise. Diuretics reduce sodium, magnesium, calcium and potassium (among other things) levels. If you need to use a diuretic, see our natural herbal products for ones with fewer side effects. One customer reported getting gout when he took beta-blockers for his high blood pressure.     Poor kidney function When kidneys are not functioning at optimum levels, they lose their ability to excrete uric acid from the body. This situation may be due to various kidney problems or over-consumption of alcohol. When alcohol is metabolized, lactic acid is produced, which hinders uric acid excretion by the kidneys.     Dieting Severe dieting or fasting can cause excess lactic  acid, which hinders uric acid excretion by the kidneys. Crash and severe calorie restriction diets shock your metabolism and can trigger a gout attack. Dieting may also cause a loss of potassium, which can increase urate levels in the blood. As mentioned  above, some dieters also use diuretics to speed the process, and they can rob the body of potassium and other minerals, triggering a gout attack. It seems to be a vicious circle! However, a proper diet that is done slowly is recommended because losing weight will reduce serum levels of uric acid.    Diet Traditional thinking tells [place] that gout is the result of excessive amounts of alcohol, protein, heavy foods, coffee and soft drinks in your diet. Certain foods contain high levels of purine which can cause uric acid levels to rise. Purine is a protein substance that is transformed into uric acid during digestion. Reduction in consumption of these foods is very often successful in reducing or eliminating gout.     A potassium deficiency can increase urate levels in the blood. This is very important, and ways to correct it?are discussed above and under the diuretics section.    Drugs that increase serum uric acid  1. Aspirin (Low dose)  2. Diuretics  3. hypertensive medications   4. Nicotinic acid   5. Cyclosporine A   6. Acetaminophen (Tylenol)  7. Others     Pharmacological treatment:  1) Colchicine (PO)  Adverse Side Effects: nausea, vomiting, diarrhea  MAX: 6 mg/day during an acute attack  Goal: keep serum urate < 7.0 mg/dl Prophylactic Dose: 0.6 -1.2 mg/day  2) Probenecid (Uricosuric properties)  Mechanism of Action: increases uric acid excretion  Adverse Side Effects: overt nephrolithiasis (Kidney stones). Side effects of probenecid are uncommon and usually mild. In addition to causing kidney stones and precipitating acute gouty arthritis, side effects of probenecid include hair loss, skin rash, headache, nausea, sore gums, and fever. In rare instances, it has caused severe anemias  To Avoid Stones:  start at low doses  Stay well hydrated  Alkalinize urine  1) Sodium Bicarbonate  2) And/or Acetazolamide (Carbonic anhydrate inhibitor) Starting Dose: 250 mg bid and increase over  several weeks  3) Allopurinol  Mechanism of Action: Decreases uric acid production  Adverse Side Effects: fever, dermatitis, elevated liver enzymes, diarrhea, and vasculitis. The most frequent adverse reaction to allopurinol is skin rash. Allopurinol should be discontinued immediately at the first appearance of rash, painful urination, blood in the urine, eye irritation, or swelling of the mouth or lips, because these can be a signs of impending severe allergic reaction, which can be fatal. Rarely, allopurinol can cause nerve, kidney, and bone marrow damage.  Dose: 300 mg/day   Treatment  1. Drink 2 to 3 L of fluid daily.  2. Consume a moderate amount of protein. Limit meat, fish and poultry to 4 - 6 oz per day. Try other low-purine good protein foods such as low fat dairy products, tofu and eggs.  3. Limit fat intake by choosing leaner meats, foods prepared with less oils and lower fat dairy products  4. Aside from avoiding high purine foods, maintaining a healthy body weight is important for gout patients as well. Obesity can result in increased uric acid production by the body. Follow a well-balanced diet to lose excess body weight. Do not follow a high-protein low-carb diet as this can worsen gout conditions.  5. Keep the urine pH high (basic or non-acidic)  6. Colchicine, probenecid, allopurinol, sodium bicarbonate.  Prevention  If you are at risk for gout, you should   1. Eat a low-cholesterol, low-fat diet. People with gout have a higher risk for heart disease. This diet would not only lower your risk for gout but also your risk for heart disease.   2. Slowly lose weight. This can lower your uric acid levels. Losing weight too rapidly can occasionally precipitate gout attacks.   3. Restrict your?intake of alcohol, especially beer.   If you have had an attack of gouty arthritis, you should do all of the above and follow the regimen prescribed by your physician. The adequate prevention of  gouty arthritis may involve lifelong medical therapy.    Balanced Diet  According to the American Medical Association, a balanced diet for people with gout include foods:  1. High in complex carbohydrates (whole grains, fruits, vegetables)   2. Low in protein (15% of calories and sources should be soy, lean meats, poultry)   3. No more than 30% of calories from fat (10% animal fat)    Beneficial Foods  Foods which may be beneficial to people with gout include:  1. Dark berries may contain chemicals that lower uric acid and reduce inflammation.   2. Tofu which is made from soybeans may be a better choice than meats.   3. Certain fatty acids found in certain fish such as salmon, flax or olive oil, or nuts may possess some anti-inflammatory benefits.    Recommended Foods to Eat  1. Fresh cherries, strawberries, blueberries, and other red-blue berries   2. Bananas   3. Celery   4. Tomatoes   5. Vegetables including kale, cabbage, parsley, green-leafy vegetables   6. Foods high in bromelain (pineapple)   7. Foods high in vitamin C (red cabbage, red bell peppers, tangerines, mandarins, oranges, potatoes)   8. Drink fruit juices and purified water (8 glasses of water per day)   9. Low-fat dairy products   10. Complex carbohydrates (breads, cereals, pasta, rice, as well as aforementioned vegetables and fruits)   11. Chocolate, cocoa   12. Coffee, tea   13. Carbonated beverages   14. Essential fatty acids (tuna and salmon, flaxseed, nuts, seeds)   15. Tofu, although a legume and made from soybeans, may be a better choice than meat    Foods to Avoid  Diets which are high in purines and high in protein have long been suspected of causing an increased risk of gout .    According to the American Medical Association, purine-containing foods include:  1. Beer, other alcoholic beverages. Limit alcohol consumption to 1 drink 3 times a week.  2. Anchovies, sardines in oil, fish roes, herring,  Mackerel, Scallops, mussels  3. Yeast. (Beer), whole grain breads and cereals, oatmeal  4. Organ meat (liver, kidneys, brains, sweetbreads)   5. Processed meats (hot dogs, lunch meats, etc.),  6. Legumes (dried beans, peas, lima beans)   7. Meat extracts, consomm, broth, bouillon, gravies. (e.g Oxo, Bovril)  8. Mushrooms, spinach, asparagus, cauliflower, mushrooms.  9. Chicken, duck, ham, [place], Game meats   10. Fried foods, roasted nuts, any food cooked in oil (heated oil destroys vitamin E)  11. Rich foods (cakes, sugar products, white flour products)  12. Dried fruits  13. Caffeine  14. Eggs    NOTE: It is important to remember that purines are found in all protein foods. All sources of purines should not be eliminated.    Urine at pH 7.0 is neutral and elimination of  uric acid decreases by approximately 50% at pH 6.5. The pka of uric acid is 5.75.  In urine at pH 5.0, only 15% of uric acid exists in solution. The solubility increases more than 10-fold at pH 7.0 and more than 100-fold at pH 8.0.    Extremely Alkaline Forming Foods - pH 8.5 to 9.0:  Lemons, Watermelon , Agar Agar , Cantaloupe, Cayenne (Capsicum), Dried dates & figs, Kelp, [place], [place]zu root, Limes, [place], Melons, Papaya, [place], [place] grapes (sweet), Watercress, Seaweed    Moderate Alkaline - pH 7.5 to 8.0  Apples (sweet), Apricots, Alfalfa sprouts Arrowroot, Avocados, Bananas (ripe), Berries, Carrots, Celery, Currants, Dates & figs (fresh), Garlic , Gooseberry, Grapes (less sweet), Grapefruit, Guavas, Herbs (leafy green), Lettuce (leafy green), Nectarine, Peaches (sweet), Pears (less sweet), Peas (fresh sweet), Persimmon, Pumpkin (sweet), Sea salt , Spinach, Apples (sour), Bamboo shoots, Beans (fresh green), Beets, Bell Pepper, Broccoli, Cabbage, Cauliflower, Carob , Daikon, Ginger (fresh), Grapes (sour), Kale, Kohlrabi, Lettuce (pale green), Oranges, Parsnip, Peaches (less sweet), Peas (less sweet), Potatoes & skin,  Pumpkin (less sweet), Raspberry, Sapote, Strawberry, Squash , Sweet corn (fresh), Tamari , Turnip, Sour Dairy    Slightly Alkaline to Neutral pH 7.0  Almonds , Artichokes ([place]), Barley-Malt (sweetener-[place]), [employer] Rice Syrup, [employer], Cherries, Coconut (fresh), Cucumbers, [employer] plant, Honey (raw), Leeks, Miso, Okra, Olives ripe , [place], [place] (home made with brown rice vinegar), Radish, Sea salt , Spices , Taro, Tomatoes (sweet), Vinegar (sweet brown rice), Water Chestnut, Amaranth, Artichoke (globe), Chestnuts (dry roasted), Egg yolks (soft cooked), Goat's milk and whey (raw) , Horseradish, Mayonnaise (home made), Millet, Olive oil, Quinoa, Rhubarb, Sesame seeds (whole) , Soy beans (dry), Sprouted grains , Tempeh, Tofu, Tomatoes (less sweet)    Slightly Acid to Neutral pH 7.0  Barley malt syrup, Barley, Bran, Cashews, Cereals (unrefined with honey-fruit-maple syrup), Cornmeal, Fructose, Honey (pasteurized), Lentils, Macadamias, Maple syrup (unprocessed),Low Fat Milk (homogenized) and most processed dairy products, Molasses organic , Nutmeg, Mustard, Pistachios, Popcorn (plain), Rice or wheat crackers (unrefined), Rye (grain), Rye bread (organic sprouted), Seeds (pumpkin & sunflower), Walnuts Blueberries, [place] nuts, Butter (salted), Cheeses (mild & crumbly) , Crackers (unrefined rye), Dried beans (mung, adzuki, pinto, kidney, garbanzo) , Dry coconut, Egg whites, Goats milk (homogenized), Olives (pickled), Pecans, Plums , Prunes , Butter (fresh unsalted), Cream (fresh & raw), Milk (raw cow's) , Whey (cow's)     ACID FORMING FOODS FATS & OILS  Avocado Oil, Canola Oil, Corn Oil, Hemp Seed Oil, Flax Oil, Lard, Olive Oil, Safflower Oil, Sesame Oil, Sunflower Oil    FRUITS  Cranberries    GRAINS  Rice Cakes, Wheat Cakes, Amaranth, Barley, Buckwheat, Oats (rolled), Quinoa, Rice, Rye, Spelt, [place], Wheat, Hemp Seed Flour    NUTS & BUTTERS  Cashews, [place] Nuts, Peanuts, Processed Peanut  Butter, Pecans, Tahini    ANIMAL PROTEIN  Beef, Carp, Clams, Fish, [place], [place], Mussels, [place], [employer], Rabbit, [place], Shrimp, [place], [place], [place], Therapist, nutritional    PASTA (WHITE)  Noodles, Macaroni, Spaghetti Distilled Vinegar, Wheat Germ    BEANS & LEGUMES  Black Beans, Chick Peas, Green Peas, Kidney Beans, Lentils, [place] Beans, Pinto Beans, Red Beans, Soy Beans, Soy Milk, White Beans, Rice Milk, Almond Milk    DRUGS & CHEMICALS  Aspartame, Chemicals, Drugs (Medicinal), Drugs (Psychedelic), Pesticides, Herbicides    ALCOHOL  Beer, Spirits, Hard Liquor, Wine    ACTIVITIES  Overwork, Anger, Fear, Jealousy, Stress    Moderate Acid - pH 6.0 to 6.5  Cigarette tobacco, Cream of Wheat (  unrefined), Fish, Fruit juices with sugar, Maple syrup (processed), Molasses (sulphured), Pickles (commercial), Breads (refined) of corn, oats, rice & rye, Cereals (refined), corn flakes, Shellfish, Wheat germ, Whole Wheat foods , Wine , Yogurt (sweetened) Bananas (green), Buckwheat, Cheeses (sharp), Corn & rice breads, Egg whole (cooked hard), Ketchup, Mayonnaise, Oats, Pasta (whole grain), Pastry (wholegrain & honey), Peanuts, Potatoes (with no skins), Popcorn (with salt & butter), Rice (basmati), Rice (brown), Soy sauce (commercial), Tapioca, Wheat bread (sprouted organic)    Extremely Acid Forming Foods - pH 5.0 to 5.5  Artificial sweeteners, Beef, Carbonated soft drinks & fizzy drinks , Cigarettes (tailor made), Drugs, Flour (white wheat), Goat, Lamb, Pastries & cakes from white flour, Pork, Sugar (white) , Beer , Brown sugar , Chicken, [place], Chocolate, Coffee , Custard with white sugar, Jams, Jellies, Liquor , Pasta (white), Rabbit, [place], Table salt refined & iodized, Tea black, [place], Wheat bread, White rice, White vinegar (processed).    Research Update:   A recent study published in the [place] [employer] on Feb 08, 2003 revealed that high intake of low-fat dairy products indeed reduces  the risk of gout by 50%. It is unknown why low-fat dairy products offer a protective effect.   Unfortunately, no natural supplements are proven effective to prevent or alleviate onset of acute gout attacks. The most effective treatment for gout attack is medication.   ____________________________________________________________________________________________

## 2019-06-06 ENCOUNTER — Encounter: Payer: Medicaid Other | Admitting: Nurse Practitioner

## 2019-06-20 ENCOUNTER — Other Ambulatory Visit: Payer: Self-pay | Admitting: Family

## 2019-06-20 MED ORDER — PROMETHAZINE HCL 12.5 MG PO TABS: 12.5000 mg | ORAL_TABLET | Freq: Four times a day (QID) | ORAL | 0 refills | Status: DC | PRN

## 2019-06-20 NOTE — Progress Notes (Signed)
Refilled phenergan for patient as she says that she call her PCP and they told her it would be 2-3 days before they could fill it for her and she was completely out of the medications. Advised patient that I would refill it this time without refills and that further refills need to come from PCP.

## 2019-06-22 NOTE — Progress Notes (Signed)
Counselor contacted patient regarding ongoing follow up.  Patient preparing to leave, would appreciate follow up at another time.  Counselor to check back in August.

## 2019-06-29 ENCOUNTER — Telehealth: Payer: Self-pay | Admitting: *Deleted

## 2019-06-29 NOTE — Telephone Encounter (Signed)
Called to check in with pt.  Left message sent and email. Last attempt was 7/13.

## 2019-07-14 ENCOUNTER — Other Ambulatory Visit (HOSPITAL_COMMUNITY): Payer: Self-pay | Admitting: Neurology

## 2019-07-14 ENCOUNTER — Other Ambulatory Visit: Payer: Self-pay | Admitting: Neurology

## 2019-07-14 DIAGNOSIS — G8929 Other chronic pain: Secondary | ICD-10-CM

## 2019-07-17 ENCOUNTER — Telehealth: Payer: Self-pay

## 2019-07-17 NOTE — Telephone Encounter (Signed)
TELEPHONE CALL NOTE  Chelsea Ramsey has been deemed a candidate for a follow-up tele-health visit to limit community exposure during the Covid-19 pandemic. I spoke with the patient via phone to ensure availability of phone/video source, confirm preferred email & phone number, discuss instructions and expectations, and review consent.   I reminded Chelsea Ramsey to be prepared with any vital sign and/or heart rhythm information that could potentially be obtained via home monitoring, at the time of her visit.  Finally, I reminded Chelsea Ramsey to expect an e-mail containing a link for their video-based visit approximately 15 minutes before her visit, or alternatively, a phone call at the time of her visit if her visit is planned to be a phone encounter.  Did the patient verbally consent to treatment as below? YES   Gaylord Shih, CMA 07/17/2019 3:03 PM  CONSENT FOR TELE-HEALTH VISIT - PLEASE REVIEW  I hereby voluntarily request, consent and authorize [facility] and its employed or contracted physicians, physician assistants, nurse practitioners or other licensed health care professionals (the Practitioner), to provide me with telemedicine health care services (the "Services") as deemed necessary by the treating Practitioner. I acknowledge and consent to receive the Services by the Practitioner via telemedicine. I understand that the telemedicine visit will involve communicating with the Practitioner through telephonic communication technology and the disclosure of certain medical information by electronic transmission. I acknowledge that I have been given the opportunity to request an in-person assessment or other available alternative prior to the telemedicine visit and am voluntarily participating in the telemedicine visit.  I understand that I have the right to withhold or withdraw my consent to the use of telemedicine in the course of my care at  any time, without affecting my right to future care or treatment, and that the Practitioner or I may terminate the telemedicine visit at any time. I understand that I have the right to inspect all information obtained and/or recorded in the course of the telemedicine visit and may receive copies of available information for a reasonable fee.  I understand that some of the potential risks of receiving the Services via telemedicine include:  Marland Kitchen Delay or interruption in medical evaluation due to technological equipment failure or disruption; . Information transmitted may not be sufficient (e.g. poor resolution of images) to allow for appropriate medical decision making by the Practitioner; and/or  . In rare instances, security protocols could fail, causing a breach of personal health information.  Furthermore, I acknowledge that it is my responsibility to provide information about my medical history, conditions and care that is complete and accurate to the best of my ability. I acknowledge that Practitioner's advice, recommendations, and/or decision may be based on factors not within their control, such as incomplete or inaccurate data provided by me or lack of visual representation. I understand that the practice of medicine is not an exact science and that Practitioner makes no warranties or guarantees regarding treatment outcomes. I acknowledge that I will receive a copy of this consent concurrently upon execution via email to the email address I last provided but may also request a printed copy by calling the office of [facility].    I understand that my insurance may be billed for this visit.   I have read or had this consent read to me. . I understand the contents of this consent, which adequately explains the benefits and risks of the Services being provided via telemedicine.  Marland Kitchen  I have been provided ample opportunity to ask questions regarding this consent and the Services and have had my  questions answered to my satisfaction. . I give my informed consent for the services to be provided through the use of telemedicine in my medical care  By participating in this telemedicine visit I agree to the above.

## 2019-07-18 ENCOUNTER — Ambulatory Visit: Payer: Medicaid Other | Attending: Family | Admitting: Family

## 2019-07-18 ENCOUNTER — Encounter: Payer: Self-pay | Admitting: Family

## 2019-07-18 ENCOUNTER — Other Ambulatory Visit: Payer: Self-pay

## 2019-07-18 VITALS — BP 121/75 | HR 82 | Wt 279.0 lb

## 2019-07-18 DIAGNOSIS — I5032 Chronic diastolic (congestive) heart failure: Secondary | ICD-10-CM

## 2019-07-18 DIAGNOSIS — I10 Essential (primary) hypertension: Secondary | ICD-10-CM

## 2019-07-18 DIAGNOSIS — Z720 Tobacco use: Secondary | ICD-10-CM

## 2019-07-18 DIAGNOSIS — R0683 Snoring: Secondary | ICD-10-CM

## 2019-07-18 NOTE — Patient Instructions (Signed)
Continue weighing daily and call for an overnight weight gain of > 2 pounds or a weekly weight gain of >5 pounds. 

## 2019-07-18 NOTE — Progress Notes (Signed)
Virtual Visit via Telephone Note   Evaluation Performed:  Follow-up visit  This visit type was conducted due to national recommendations for restrictions regarding the COVID-19 Pandemic (e.g. social distancing).  This format is felt to be most appropriate for this patient at this time.  All issues noted in this document were discussed and addressed.  No physical exam was performed (except for noted visual exam findings with Video Visits).  Please refer to the patient's chart (MyChart message for video visits and phone note for telephone visits) for the patient's consent to telehealth for [facility]  Date:  07/18/2019   ID:  Chelsea Ramsey, DOB 09-11-1966, MRN [id number]  Patient Location:  [address] [place] [address]   Provider location:   [facility] [place] [address], [place] [address]  PCP:  Kathee Delton, MD  Cardiologist:  No primary care provider on file.  Electrophysiologist:  None   Chief Complaint:  Shortness of breath  History of Present Illness:    Chelsea Ramsey is a 53 y.o. female who presents via audio/video conferencing for a telehealth visit today.  Patient verified DOB and address.  The patient does not have symptoms concerning for COVID-19 infection (fever, chills, cough, or new SHORTNESS OF BREATH).   Patient reports moderate shortness of breath upon minimal exertion. She describes this as chronic in nature having been present for several years although she does feel like it's been worsening recently. She has associated fatigue, abdominal distention and dizziness along with this. She denies any difficulty sleeping, cough, chest pain, palpitations, pedal edema or weight gain. She's been taking metolazone 2-3 times/ week.  She says that she's been on bumex as well as torsemide in the past and furosemide seems to work the best for her.  Prior CV studies:   The following studies were  reviewed today:  Echo report from 11/05/18 reviewed and showed an EF of 50-55% along with mild/moderate AR, mild MR and moderate TR.   Past Medical History:  Diagnosis Date  . Acute drug-induced gout of left foot 03/01/2018   Last Assessment & Plan:  Likely at least partially brought on by diuresis.  Needs to continue to diurese  Will push hydration Stop allopurinol given initiation during acute flare may worsen this, re-broach this when asymptomatic Avoid nsaids given stomach pain Trial colchicine Add acetaminophen Ice, elevate, rest  . Allergy    seasonal  . Anxiety   . Arthritis    Right Knee  . Asthma   . CHF (congestive heart failure) ([place])   . COPD (chronic obstructive pulmonary disease) ([place])   . Coronary artery disease    Leaky heart valve  . Fibromyalgia   . GERD (gastroesophageal reflux disease)   . Hypertension   . Pneumonia   . PUD (peptic ulcer disease)   . Pulmonary HTN ([place])   . Rheumatic fever/heart disease   . Sleep apnea    Past Surgical History:  Procedure Laterality Date  . ADENOIDECTOMY    . ESOPHAGOGASTRODUODENOSCOPY (EGD) WITH PROPOFOL N/A 05/25/2018   Procedure: ESOPHAGOGASTRODUODENOSCOPY (EGD) WITH PROPOFOL;  Surgeon: Lucilla Lame, MD;  Location: [facility] ENDOSCOPY;  Service: Endoscopy;  Laterality: N/A;  . MITRAL VALVE REPLACEMENT    . MULTIPLE TOOTH EXTRACTIONS    . TONSILLECTOMY    . TOTAL KNEE ARTHROPLASTY Right 09/04/2016   Procedure: TOTAL KNEE ARTHROPLASTY; with lateral release;  Surgeon: Earlie Server, MD;  Location: [facility];  Service:  Orthopedics;  Laterality: Right;     No outpatient medications have been marked as taking for the 07/18/19 encounter (Telemedicine) with Alisa Graff, FNP.     Allergies:   Amiodarone, Aspirin, Flexeril [cyclobenzaprine], Trazamine [trazodone & diet manage prod], Codeine, and Tramadol   Social History   Tobacco Use  . Smoking status: Current Every Day Smoker    Packs/day: 0.10    Types: Cigarettes  . Smokeless  tobacco: Never Used  . Tobacco comment: 4/20 was not able to quit.  Still at 1-3 a day. She would like to wait to set a new quit date until we get back to class to have the extra in person support  Substance Use Topics  . Alcohol use: Not Currently    Alcohol/week: 3.0 standard drinks    Types: 3 Cans of beer per week    Comment: 16 oz per week  . Drug use: Not Currently    Comment: Previous use of cocaine and marijuana last use 07/31/16     Prior to Admission medications   Medication Sig Start Date End Date Taking? Authorizing Provider  albuterol (PROAIR HFA) 108 (90 Base) MCG/ACT inhaler Inhale 1-2 puffs into the lungs every 6 (six) hours as needed for wheezing or shortness of breath.   Yes [provider]  allopurinol (ZYLOPRIM) 100 MG tablet Take 1 tablet (100 mg total) by mouth 2 (two) times daily. 06/05/19 09/03/19 Yes Milinda Pointer, MD  ALPRAZolam Duanne Moron) 0.25 MG tablet Take 0.25 mg by mouth 2 (two) times daily as needed for anxiety.   Yes [provider]  budesonide-formoterol (SYMBICORT) 160-4.5 MCG/ACT inhaler Inhale 2 puffs into the lungs 2 (two) times daily. 02/26/16  Yes [provider]  butalbital-acetaminophen-caffeine (FIORICET, ESGIC) 50-325-40 MG tablet Take 1 tablet by mouth every 6 (six) hours as needed.    Yes [provider]  Calcium Carbonate-Vit D-Min (GNP CALCIUM 1200) 1200-1000 MG-UNIT CHEW Chew 1,200 mg by mouth daily with breakfast. Take in combination with vitamin D and magnesium. 06/18/19 09/16/19 Yes Milinda Pointer, MD  Cholecalciferol (D 5000) 125 MCG (5000 UT) capsule Take by mouth. 11/14/18  Yes [provider]  colchicine 0.6 MG tablet Take 1 tablet (0.6 mg total) by mouth daily. As directed 06/05/19 09/03/19 Yes Milinda Pointer, MD  docusate sodium (COLACE) 100 MG capsule Take 200 mg by mouth daily.   Yes [provider]  furosemide (LASIX) 40 MG tablet Take 2 tablets in morning and 1 tablet in afternoon  05/16/19  Yes Hackney, Tina A, FNP  gabapentin (NEURONTIN) 600 MG tablet Take 0.5 tablets (300 mg total) by mouth 2 (two) times daily. Patient taking differently: Take 600 mg by mouth 3 (three) times daily.  12/06/18  Yes Vaughan Basta, MD  Galcanezumab-gnlm 120 MG/ML SOSY Inject into the skin. 03/01/19  Yes [provider]  guaiFENesin (MUCINEX) 600 MG 12 hr tablet Take 1 tablet (600 mg total) by mouth 2 (two) times daily. 11/19/18  Yes Salary, Montell D, MD  ipratropium-albuterol (DUONEB) 0.5-2.5 (3) MG/3ML SOLN Inhale 3 mLs into the lungs 3 (three) times daily as needed (respiratory).  02/26/16 07/18/19 Yes [provider]  lactulose (CHRONULAC) 10 GM/15ML solution Take 45 mLs (30 g total) by mouth 2 (two) times daily as needed for mild constipation. 07/21/18  Yes Epifanio Lesches, MD  meclizine (ANTIVERT) 12.5 MG tablet Take 25 mg by mouth 3 (three) times daily as needed for dizziness.   Yes [provider]  metolazone (ZAROXOLYN) 2.5  MG tablet Take 2.5 mg by mouth. Twice weekly   Yes [provider]  metoprolol tartrate (LOPRESSOR) 100 MG tablet Take 1 tablet (100 mg total) by mouth 2 (two) times daily. 12/21/18  Yes Hackney, Otila Kluver A, FNP  nitroGLYCERIN (NITROSTAT) 0.4 MG SL tablet Place 1 tablet (0.4 mg total) under the tongue every 5 (five) minutes as needed for chest pain. 11/05/18  Yes Mody, Ulice Bold, MD  nortriptyline (PAMELOR) 10 MG capsule Take 50 mg by mouth at bedtime.    Yes [provider]  Oxycodone HCl 10 MG TABS Take 0.5-1 tablets (5-10 mg total) by mouth 2 (two) times daily as needed. Must last 30 days. MAX.: 2/day 06/18/19 07/18/19 Yes Milinda Pointer, MD  Oxycodone HCl 10 MG TABS Take 0.5-1 tablets (5-10 mg total) by mouth 2 (two) times daily as needed. Must last 30 days. MAX.: 2/day 07/18/19 08/17/19 Yes Milinda Pointer, MD  Oxycodone HCl 10 MG TABS Take 0.5-1 tablets (5-10 mg total) by mouth 2 (two) times daily as needed. Must last  30 days. MAX.: 2/day 08/17/19 09/16/19 Yes Milinda Pointer, MD  pantoprazole (PROTONIX) 40 MG tablet Take 40 mg by mouth 2 (two) times daily.   Yes [provider]  potassium chloride SA (K-DUR,KLOR-CON) 20 MEQ tablet Take 1 tablet (20 mEq total) by mouth daily. And additional tablet when taking metolazone 12/12/18  Yes Hackney, Aura Fey, FNP  promethazine (PHENERGAN) 12.5 MG tablet Take 1 tablet (12.5 mg total) by mouth every 6 (six) hours as needed for nausea or vomiting. 06/20/19  Yes Alisa Graff, FNP    Family Hx: The patient's family history includes Asthma in her mother; COPD in her mother; Cancer in her mother; Congestive Heart Failure in her father.  ROS:   Please see the history of present illness.     All other systems reviewed and are negative.   Labs/Other Tests and Data Reviewed:    Recent Labs: 12/01/2018: ALT 20; B Natriuretic Peptide 259.0 12/03/2018: Magnesium 2.1 12/27/2018: BUN 12; Creatinine, Ser 0.59; Hemoglobin 10.2; Platelets 323; Potassium 3.7; Sodium 136   Recent Lipid Panel Lab Results  Component Value Date/Time   CHOL 96 04/03/2014   CHOL 91 12/09/2013 04:27 AM   TRIG 63 04/03/2014   TRIG 50 12/09/2013 04:27 AM   HDL 32 (A) 04/03/2014   HDL 31 (L) 12/09/2013 04:27 AM   LDLCALC 51 04/03/2014   LDLCALC 50 12/09/2013 04:27 AM    Wt Readings from Last 3 Encounters:  07/18/19 279 lb (126.6 kg)  05/16/19 275 lb 6 oz (124.9 kg)  04/04/19 270 lb (122.5 kg)     Exam:    Vital Signs:  Wt 279 lb (126.6 kg) Comment: self-reported  LMP 11/08/2009 Comment: menopause  BMI 47.89 kg/m    Well nourished, well developed female in no  acute distress.   ASSESSMENT & PLAN:    1.  Chronic heart failure with preserved ejection fraction- - NYHA class III - mild fluid overloaded todayper patient's description of symptoms - weighing daily and home weight has gradually declined over the last few days; Reminded to call for an overnight weight gain of >2  pounds or a weekly weight gain of >5 pounds - does have metolazone that she takes 2-3 times weekly and she does take an extra potassium tablet when she takes it - discussed changing her diuretic back to torsemide but patient wants to stay on furosemide for now as she feels like this works best for her -  not adding salt and has been trying to eat low sodium.  - had telemedicine visit with cardiology Margarito Courser) 07/13/2019 - BNP 12/01/2018 was 259.0 - wearing oxygen at 3L at night  2: HTN- -has not gotten established with PCP locally yet due to COVID-19 restrictions so continues with [facility]; had telemedicine visit 05/08/2019 - BMP from 01/16/2019 reviewed and showed sodium 139, potassium 3.5, creatinine 0.6 and GFR 128  3: Tobacco use- - patient endorses smoking ~ 2-3 cigarettes daily  - complete cessation discussed for 3 minutes with her   4: Snoring- - patient endorses snoring, apneic episodes and fatigue - she also says that her oxygen level gets low at times - patient says that she's waiting to hear from sleep lab regarding an appointment  COVID-19 Education: The signs and symptoms of COVID-19 were discussed with the patient and how to seek care for testing (follow up with PCP or arrange E-visit).  The importance of social distancing was discussed today.  Patient Risk:   After full review of this patients clinical status, I feel that they are at least moderate risk at this time.  Time:   Today, I have spent 11 minutes with the patient with telehealth technology discussing medications, weight and symptoms to report.     Medication Adjustments/Labs and Tests Ordered: Current medicines are reviewed at length with the patient today.  Concerns regarding medicines are outlined above.   Tests Ordered: No orders of the defined types were placed in this encounter.  Medication Changes: No orders of the defined types were placed in this encounter.   Disposition:  Follow-up in 2 months or  sooner for any questions/problems before then.   Signed, Alisa Graff, FNP  07/18/2019 1:51 PM    [facility]

## 2019-07-23 ENCOUNTER — Other Ambulatory Visit: Payer: Self-pay

## 2019-07-23 ENCOUNTER — Ambulatory Visit
Admission: RE | Admit: 2019-07-23 | Discharge: 2019-07-23 | Disposition: A | Discharge status: Home / Self Care | Payer: Medicaid Other | Source: Ambulatory Visit | Attending: Neurology | Admitting: Neurology

## 2019-07-23 DIAGNOSIS — G8929 Other chronic pain: Secondary | ICD-10-CM

## 2019-07-23 DIAGNOSIS — R51 Headache: Secondary | ICD-10-CM | POA: Insufficient documentation

## 2019-07-24 ENCOUNTER — Other Ambulatory Visit
Admission: RE | Admit: 2019-07-24 | Discharge: 2019-07-24 | Disposition: A | Discharge status: Home / Self Care | Payer: Medicaid Other | Source: Ambulatory Visit | Attending: Physician Assistant | Admitting: Physician Assistant

## 2019-07-24 DIAGNOSIS — Z79899 Other long term (current) drug therapy: Secondary | ICD-10-CM | POA: Diagnosis present

## 2019-07-24 DIAGNOSIS — I5032 Chronic diastolic (congestive) heart failure: Secondary | ICD-10-CM | POA: Insufficient documentation

## 2019-07-24 LAB — BRAIN NATRIURETIC PEPTIDE: B Natriuretic Peptide: 104 pg/mL — ABNORMAL HIGH (ref 0.0–100.0)

## 2019-07-27 NOTE — Progress Notes (Signed)
Counselor contacted patient for ongoing follow up.  Patient reported she was doing well today; no concerns.  Contact lies with client.

## 2019-08-03 DIAGNOSIS — R200 Anesthesia of skin: Secondary | ICD-10-CM | POA: Insufficient documentation

## 2019-08-03 DIAGNOSIS — R519 Headache, unspecified: Secondary | ICD-10-CM | POA: Insufficient documentation

## 2019-08-23 ENCOUNTER — Other Ambulatory Visit: Payer: Self-pay | Admitting: Family

## 2019-08-23 MED ORDER — FUROSEMIDE 40 MG PO TABS: ORAL_TABLET | ORAL | 5 refills | Status: DC

## 2019-08-23 MED ORDER — POTASSIUM CHLORIDE CRYS ER 20 MEQ PO TBCR: 20.0000 meq | EXTENDED_RELEASE_TABLET | Freq: Every day | ORAL | 5 refills | Status: DC

## 2019-08-24 ENCOUNTER — Ambulatory Visit: Payer: Medicaid Other | Admitting: Physician Assistant

## 2019-08-24 ENCOUNTER — Other Ambulatory Visit: Payer: Self-pay

## 2019-08-24 ENCOUNTER — Other Ambulatory Visit
Admission: RE | Admit: 2019-08-24 | Discharge: 2019-08-24 | Disposition: A | Discharge status: Home / Self Care | Payer: Medicaid Other | Source: Ambulatory Visit | Attending: Physician Assistant | Admitting: Physician Assistant

## 2019-08-24 ENCOUNTER — Encounter: Payer: Self-pay | Admitting: Physician Assistant

## 2019-08-24 VITALS — BP 114/68 | HR 88 | Temp 96.6°F | Wt 286.0 lb

## 2019-08-24 DIAGNOSIS — J449 Chronic obstructive pulmonary disease, unspecified: Secondary | ICD-10-CM | POA: Diagnosis not present

## 2019-08-24 DIAGNOSIS — R7303 Prediabetes: Secondary | ICD-10-CM

## 2019-08-24 DIAGNOSIS — I4891 Unspecified atrial fibrillation: Secondary | ICD-10-CM | POA: Insufficient documentation

## 2019-08-24 DIAGNOSIS — R519 Headache, unspecified: Secondary | ICD-10-CM

## 2019-08-24 DIAGNOSIS — F1491 Cocaine use, unspecified, in remission: Secondary | ICD-10-CM

## 2019-08-24 DIAGNOSIS — I509 Heart failure, unspecified: Secondary | ICD-10-CM

## 2019-08-24 DIAGNOSIS — E66813 Obesity, class 3: Secondary | ICD-10-CM

## 2019-08-24 DIAGNOSIS — R51 Headache: Secondary | ICD-10-CM

## 2019-08-24 DIAGNOSIS — Z6841 Body Mass Index (BMI) 40.0 and over, adult: Secondary | ICD-10-CM

## 2019-08-24 DIAGNOSIS — D649 Anemia, unspecified: Secondary | ICD-10-CM | POA: Diagnosis present

## 2019-08-24 DIAGNOSIS — M1A9XX0 Chronic gout, unspecified, without tophus (tophi): Secondary | ICD-10-CM

## 2019-08-24 DIAGNOSIS — I10 Essential (primary) hypertension: Secondary | ICD-10-CM | POA: Diagnosis not present

## 2019-08-24 DIAGNOSIS — R42 Dizziness and giddiness: Secondary | ICD-10-CM

## 2019-08-24 DIAGNOSIS — M5388 Other specified dorsopathies, sacral and sacrococcygeal region: Secondary | ICD-10-CM

## 2019-08-24 DIAGNOSIS — Z720 Tobacco use: Secondary | ICD-10-CM

## 2019-08-24 DIAGNOSIS — Z87898 Personal history of other specified conditions: Secondary | ICD-10-CM

## 2019-08-24 DIAGNOSIS — K219 Gastro-esophageal reflux disease without esophagitis: Secondary | ICD-10-CM

## 2019-08-24 LAB — CBC WITH DIFFERENTIAL/PLATELET
Abs Immature Granulocytes: 0.03 10*3/uL (ref 0.00–0.07)
Basophils Absolute: 0 10*3/uL (ref 0.0–0.1)
Basophils Relative: 0 %
Eosinophils Absolute: 0.1 10*3/uL (ref 0.0–0.5)
Eosinophils Relative: 1 %
HCT: 38 % (ref 36.0–46.0)
Hemoglobin: 11.7 g/dL — ABNORMAL LOW (ref 12.0–15.0)
Immature Granulocytes: 0 %
Lymphocytes Relative: 21 %
Lymphs Abs: 2.3 10*3/uL (ref 0.7–4.0)
MCH: 24 pg — ABNORMAL LOW (ref 26.0–34.0)
MCHC: 30.8 g/dL (ref 30.0–36.0)
MCV: 77.9 fL — ABNORMAL LOW (ref 80.0–100.0)
Monocytes Absolute: 0.7 10*3/uL (ref 0.1–1.0)
Monocytes Relative: 7 %
Neutro Abs: 7.5 10*3/uL (ref 1.7–7.7)
Neutrophils Relative %: 71 %
Platelets: 298 10*3/uL (ref 150–400)
RBC: 4.88 MIL/uL (ref 3.87–5.11)
RDW: 17.1 % — ABNORMAL HIGH (ref 11.5–15.5)
WBC: 10.7 10*3/uL — ABNORMAL HIGH (ref 4.0–10.5)
nRBC: 0 % (ref 0.0–0.2)

## 2019-08-24 LAB — COMPREHENSIVE METABOLIC PANEL
ALT: 17 U/L (ref 0–44)
AST: 21 U/L (ref 15–41)
Albumin: 4 g/dL (ref 3.5–5.0)
Alkaline Phosphatase: 138 U/L — ABNORMAL HIGH (ref 38–126)
Anion gap: 14 (ref 5–15)
BUN: 16 mg/dL (ref 6–20)
CO2: 34 mmol/L — ABNORMAL HIGH (ref 22–32)
Calcium: 9.1 mg/dL (ref 8.9–10.3)
Chloride: 88 mmol/L — ABNORMAL LOW (ref 98–111)
Creatinine, Ser: 0.68 mg/dL (ref 0.44–1.00)
GFR calc Af Amer: >60 mL/min (ref >60)
GFR calc non Af Amer: >60 mL/min (ref >60)
Glucose, Bld: 105 mg/dL — ABNORMAL HIGH (ref 70–99)
Potassium: 3.2 mmol/L — ABNORMAL LOW (ref 3.5–5.1)
Sodium: 136 mmol/L (ref 135–145)
Total Bilirubin: 0.5 mg/dL (ref 0.3–1.2)
Total Protein: 8.2 g/dL — ABNORMAL HIGH (ref 6.5–8.1)

## 2019-08-24 LAB — IRON AND TIBC
Iron: 41 ug/dL (ref 28–170)
Saturation Ratios: 8 % — ABNORMAL LOW (ref 10.4–31.8)
TIBC: 521 ug/dL — ABNORMAL HIGH (ref 250–450)
UIBC: 480 ug/dL

## 2019-08-24 LAB — FERRITIN: Ferritin: 25 ng/mL (ref 11–307)

## 2019-08-24 MED ORDER — BUDESONIDE-FORMOTEROL FUMARATE 160-4.5 MCG/ACT IN AERO: 2.0000 | INHALATION_SPRAY | Freq: Two times a day (BID) | RESPIRATORY_TRACT | 1 refills | Status: DC

## 2019-08-24 MED ORDER — PROMETHAZINE HCL 12.5 MG PO TABS: 12.5000 mg | ORAL_TABLET | Freq: Four times a day (QID) | ORAL | 0 refills | Status: DC | PRN

## 2019-08-24 MED ORDER — TRELEGY ELLIPTA 100-62.5-25 MCG/INH IN AEPB: 1.0000 | INHALATION_SPRAY | Freq: Every morning | RESPIRATORY_TRACT | 2 refills | Status: AC

## 2019-08-24 MED ORDER — MECLIZINE HCL 12.5 MG PO TABS: 25.0000 mg | ORAL_TABLET | Freq: Three times a day (TID) | ORAL | 0 refills | Status: DC | PRN

## 2019-08-24 MED ORDER — IPRATROPIUM-ALBUTEROL 0.5-2.5 (3) MG/3ML IN SOLN: 3.0000 mL | Freq: Three times a day (TID) | RESPIRATORY_TRACT | 1 refills | Status: DC | PRN

## 2019-08-24 MED ORDER — OMEPRAZOLE 20 MG PO CPDR: 20.0000 mg | DELAYED_RELEASE_CAPSULE | Freq: Every day | ORAL | 3 refills | Status: DC

## 2019-08-24 NOTE — Progress Notes (Signed)
Patient: Chelsea Ramsey Female    DOB: 1966/08/25   53 y.o.   MRN: [id number] Visit Date: 08/24/2019  Today's Provider: Trinna Post, PA-C   Chief Complaint  Patient presents with  . Establish Care   Subjective:     HPI   Previously seen at [facility] but reports this is too far for her to travel since she does not drive and attends her appointments by bus. Occasionally her uncle who gets off work at 3:00 PM will drive her. She lives in [place], [place]. She lives on and off with a female partner. She has one child. She is currently disable due to her medical conditions, including heart failure. She is smoking 2 cigarettes a day. She is not use alcohol. She has used marijuana and snorted cocaine in the past. Denies IV drug use. Says she last used cocaine "years ago."  CHF: Followed by Dr. Saralyn Pilar at [facility] and [place]. She takes Lasix 80 mg BID and metolazone 2.5 mg twice weekly. She is taking potassium 35mEq with instructions to add tablet Most recently she has taken her fluid pills in excess. She reports her heart failure provider told her to have labs checked. Last echo 10/2018 EF 50-55%. Mild to moderate aortic regurgitation. 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 Mildly dilated right atrium and ventricle. Moderate tricuspid regurgitation.    HTN: metoprolol tartrate 100 mg BID. Nitroglycerin PRN.   Tobacco Abuse: Continues to smoke 3 cigarettes daily.   Substance Abuse: History of using marijuana and snorting cocaine. States she hasn't used cocaine in years.   COPD: Continues to smoke. Uses Symbicort once in the morning and does rinse her mouth out afterwards. Has duonebs which she uses on a scheduled basis morning and night because she feels she needs them.   Chronic Headache: She sees Dr. Melrose Nakayama with [name] neurology for this.   GI Bleed/Gastric Ulcer: Patient was admitted 04/2018 to [facility] with melena after starting Xarelto  for a-fib with RVR. She required 2 PRBC transfusion. She had a small gastric ulcer on endoscopy and was discharged with Pepcid. She is currently taking pantorpazole but would like to go back on omeprazole. She is having some abdominal pain in this area. Last colonoscopy 05/31/2018 at [facility] with poor prep.   Chronic Pain: managed by pain management with Dr. Dossie Arbour. She is on chronic opioid therapy. She uses lactulose PRN for constipation.  Gout: Chronic. She is on allopurinol and colchicine for this, given by Dr. Dossie Arbour.   Vertigo: Will occasionally have episodes where the room spins, she becomes nauseated and vomits. She has meclizine and phenergan for this. She was referred by Dr. Melrose Nakayama to ENT but referral needs to come from primary care.   PAP: 01/2014 with [facility], normal. She is due for another PAP.  Mammogram: 01/26/2019 normal   Allergies  Allergen Reactions  . Amiodarone Nausea And Vomiting  . Aspirin Swelling  . Flexeril [Cyclobenzaprine] Swelling  . Trazamine [Trazodone & Diet Manage Prod] Nausea And Vomiting  . Codeine Rash  . Tramadol Rash     Current Outpatient Medications:  .  albuterol (PROAIR HFA) 108 (90 Base) MCG/ACT inhaler, Inhale 1-2 puffs into the lungs every 6 (six) hours as needed for wheezing or shortness of breath., Disp: , Rfl:  .  allopurinol (ZYLOPRIM) 100 MG tablet, Take 1 tablet (100 mg total) by mouth 2 (two) times daily., Disp: 60 tablet, Rfl: 2 .  ALPRAZolam Duanne Moron)  0.25 MG tablet, Take 0.25 mg by mouth 2 (two) times daily as needed for anxiety., Disp: , Rfl:  .  budesonide-formoterol (SYMBICORT) 160-4.5 MCG/ACT inhaler, Inhale 2 puffs into the lungs 2 (two) times daily., Disp: , Rfl:  .  butalbital-acetaminophen-caffeine (FIORICET, ESGIC) 50-325-40 MG tablet, Take 1 tablet by mouth every 6 (six) hours as needed. , Disp: , Rfl:  .  Calcium Carbonate-Vit D-Min (GNP CALCIUM 1200) 1200-1000 MG-UNIT CHEW, Chew 1,200 mg by mouth daily with breakfast. Take in  combination with vitamin D and magnesium., Disp: 90 tablet, Rfl: 0 .  Cholecalciferol (D 5000) 125 MCG (5000 UT) capsule, Take by mouth., Disp: , Rfl:  .  colchicine 0.6 MG tablet, Take 1 tablet (0.6 mg total) by mouth daily. As directed, Disp: 30 tablet, Rfl: 2 .  docusate sodium (COLACE) 100 MG capsule, Take 200 mg by mouth daily., Disp: , Rfl:  .  furosemide (LASIX) 40 MG tablet, Take 2 tablets twice daily, Disp: 90 tablet, Rfl: 5 .  gabapentin (NEURONTIN) 600 MG tablet, Take 0.5 tablets (300 mg total) by mouth 2 (two) times daily. (Patient taking differently: Take 600 mg by mouth 3 (three) times daily. ), Disp: 60 tablet, Rfl: 0 .  Galcanezumab-gnlm 120 MG/ML SOSY, Inject into the skin., Disp: , Rfl:  .  guaiFENesin (MUCINEX) 600 MG 12 hr tablet, Take 1 tablet (600 mg total) by mouth 2 (two) times daily., Disp: 20 tablet, Rfl: 0 .  ipratropium-albuterol (DUONEB) 0.5-2.5 (3) MG/3ML SOLN, Inhale 3 mLs into the lungs 3 (three) times daily as needed (respiratory). , Disp: , Rfl:  .  lactulose (CHRONULAC) 10 GM/15ML solution, Take 45 mLs (30 g total) by mouth 2 (two) times daily as needed for mild constipation., Disp: 240 mL, Rfl: 0 .  meclizine (ANTIVERT) 12.5 MG tablet, Take 25 mg by mouth 3 (three) times daily as needed for dizziness., Disp: , Rfl:  .  metolazone (ZAROXOLYN) 2.5 MG tablet, Take 2.5 mg by mouth. Twice weekly, Disp: , Rfl:  .  metoprolol tartrate (LOPRESSOR) 100 MG tablet, Take 1 tablet (100 mg total) by mouth 2 (two) times daily., Disp: 60 tablet, Rfl: 3 .  nitroGLYCERIN (NITROSTAT) 0.4 MG SL tablet, Place 1 tablet (0.4 mg total) under the tongue every 5 (five) minutes as needed for chest pain., Disp: 30 tablet, Rfl: 12 .  nortriptyline (PAMELOR) 10 MG capsule, Take 50 mg by mouth at bedtime. , Disp: , Rfl:  .  Oxycodone HCl 10 MG TABS, Take 0.5-1 tablets (5-10 mg total) by mouth 2 (two) times daily as needed. Must last 30 days. MAX.: 2/day, Disp: 60 tablet, Rfl: 0 .  Oxycodone HCl  10 MG TABS, Take 0.5-1 tablets (5-10 mg total) by mouth 2 (two) times daily as needed. Must last 30 days. MAX.: 2/day, Disp: 60 tablet, Rfl: 0 .  Oxycodone HCl 10 MG TABS, Take 0.5-1 tablets (5-10 mg total) by mouth 2 (two) times daily as needed. Must last 30 days. MAX.: 2/day, Disp: 60 tablet, Rfl: 0 .  pantoprazole (PROTONIX) 40 MG tablet, Take 40 mg by mouth 2 (two) times daily., Disp: , Rfl:  .  potassium chloride SA (K-DUR) 20 MEQ tablet, Take 1 tablet (20 mEq total) by mouth daily. And additional tablet when taking metolazone, Disp: 60 tablet, Rfl: 5 .  promethazine (PHENERGAN) 12.5 MG tablet, Take 1 tablet (12.5 mg total) by mouth every 6 (six) hours as needed for nausea or vomiting., Disp: 30 tablet, Rfl: 0  Review of  Systems  Constitutional: Negative.   HENT: Negative.   Eyes: Positive for photophobia and visual disturbance. Negative for pain, discharge, redness and itching.  Respiratory: Positive for apnea. Negative for cough, choking, chest tightness, shortness of breath, wheezing and stridor.   Cardiovascular: Positive for leg swelling. Negative for chest pain and palpitations.  Gastrointestinal: Positive for abdominal distention and nausea. Negative for abdominal pain, anal bleeding, blood in stool, constipation, diarrhea, rectal pain and vomiting.  Endocrine: Negative.   Genitourinary: Negative.   Musculoskeletal: Positive for arthralgias, back pain, gait problem, joint swelling, myalgias and neck stiffness. Negative for neck pain.  Skin: Negative.   Allergic/Immunologic: Negative.   Neurological: Positive for dizziness, light-headedness and headaches. Negative for tremors, seizures, syncope, facial asymmetry, speech difficulty, weakness and numbness.  Hematological: Negative for adenopathy. Bruises/bleeds easily.  Psychiatric/Behavioral: Positive for sleep disturbance. Negative for agitation, behavioral problems, confusion, decreased concentration, dysphoric mood, hallucinations,  self-injury and suicidal ideas. The patient is nervous/anxious. The patient is not hyperactive.     Social History   Tobacco Use  . Smoking status: Current Every Day Smoker    Packs/day: 0.10    Types: Cigarettes  . Smokeless tobacco: Never Used  . Tobacco comment: 4/20 was not able to quit.  Still at 1-3 a day. She would like to wait to set a new quit date until we get back to class to have the extra in person support  Substance Use Topics  . Alcohol use: Not Currently    Alcohol/week: 3.0 standard drinks    Types: 3 Cans of beer per week    Comment: 16 oz per week      Objective:   LMP 11/08/2009 Comment: menopause There were no vitals filed for this visit.There is no height or weight on file to calculate BMI.   Physical Exam Constitutional:      Appearance: Normal appearance. She is obese.  Cardiovascular:     Rate and Rhythm: Normal rate and regular rhythm.     Heart sounds: Normal heart sounds.  Pulmonary:     Effort: Pulmonary effort is normal.     Breath sounds: Normal breath sounds.  Abdominal:     General: Bowel sounds are normal.     Palpations: Abdomen is soft.  Skin:    General: Skin is warm and dry.  Neurological:     Mental Status: She is alert and oriented to person, place, and time. Mental status is at baseline.  Psychiatric:        Mood and Affect: Mood normal.        Behavior: Behavior normal.      No results found for any visits on 08/24/19.     Assessment & Plan    1. Chronic obstructive pulmonary disease, unspecified COPD type ([place])  She continues to smoke. She is taking symbicort but uses duoneb twice daily. I will change to trelegy for better control.   - ipratropium-albuterol (DUONEB) 0.5-2.5 (3) MG/3ML SOLN; Inhale 3 mLs into the lungs 3 (three) times daily as needed (respiratory).  Dispense: 360 mL; Refill: 1 - Fluticasone-Umeclidin-Vilant (TRELEGY ELLIPTA) 100-62.5-25 MCG/INH AEPB; Inhale 1 puff into the lungs every morning.   Dispense: 60 each; Refill: 2  2. Atrial fibrillation, unspecified type ([place])  Previously on xarelto but had GI bleed requiring transfusion with 2 PRBCs.  - Comprehensive Metabolic Panel (CMET)  3. Prediabetes  - HgB A1c - Comprehensive Metabolic Panel (CMET)  4. Essential hypertension  Taking metoprolol tartrate 100 mg BID. Normotensive today.  5. Vertigo  - Ambulatory referral to ENT - promethazine (PHENERGAN) 12.5 MG tablet; Take 1 tablet (12.5 mg total) by mouth every 6 (six) hours as needed for nausea or vomiting.  Dispense: 30 tablet; Refill: 0 - meclizine (ANTIVERT) 12.5 MG tablet; Take 2 tablets (25 mg total) by mouth 3 (three) times daily as needed for dizziness.  Dispense: 30 tablet; Refill: 0  6. Gastroesophageal reflux disease, esophagitis presence not specified  History of GI bleed. Counseled on reducing triggers, no NSAIDs. Change back to omperazole. Refer to GI for continued abdominal pain.   - omeprazole (PRILOSEC) 20 MG capsule; Take 1 capsule (20 mg total) by mouth daily.  Dispense: 30 capsule; Refill: 3  7. Anemia, unspecified type  - CBC with Differential - Fe+TIBC+Fer  8. Chronic gout involving toe of foot, unspecified cause (Left)  Allopurinol and colchicine.   9. History of cocaine use  Patient reports she last used years ago.  10. Other specified dorsopathies, sacral and sacrococcygeal region  11. Class 3 severe obesity due to excess calories with serious comorbidity and body mass index (BMI) of 45.0 to 49.9 in adult [facility])  Counseled on importance of losing weight.  12. Chronic congestive heart failure, unspecified heart failure type ([place])  Followed by heart failure clinic.   13. Tobacco abuse  Still smoking.  14. Headache, chronic daily  Followed by [facility], Dr. Melrose Nakayama.   The entirety of the information documented in the History of Present Illness, Review of Systems and Physical Exam were personally obtained by me. Portions  of this information were initially documented by Ashley Royalty, CMA and reviewed by me for thoroughness and accuracy.   F/u 1 month for PAP.   I have spent 60 minutes with this patient, >50% of which was spent on counseling and coordination of care.     Trinna Post, PA-C  [facility]

## 2019-08-25 ENCOUNTER — Encounter: Payer: Self-pay | Admitting: Physician Assistant

## 2019-08-25 ENCOUNTER — Telehealth: Payer: Self-pay

## 2019-08-25 LAB — HEMOGLOBIN A1C
Hgb A1c MFr Bld: 6.2 % — ABNORMAL HIGH (ref 4.8–5.6)
Mean Plasma Glucose: 131 mg/dL

## 2019-08-25 NOTE — Telephone Encounter (Signed)
Patient advised as directed below. 

## 2019-08-25 NOTE — Telephone Encounter (Signed)
-----   Message from Trinna Post, [place] sent at 08/25/2019 12:35 PM EDT ----- She has borderline diabetes. This can turn into diabetes over the next few years.This would be treated by reducing sugar intake like sodas, sweet tea, pasta, rice, potatoes and bread. Her iron levels are normal. Her electrolytes are off. She needs to take her diuretics as prescribed by her heart providers and not take extra. I will forward this to the HF clinic for their review as well. Her blood counts are stable.

## 2019-08-25 NOTE — Patient Instructions (Signed)
Health Maintenance, Female Adopting a healthy lifestyle and getting preventive care are important in promoting health and wellness. Ask your health care provider about:  The right schedule for you to have regular tests and exams.  Things you can do on your own to prevent diseases and keep yourself healthy. What should I know about diet, weight, and exercise? Eat a healthy diet   Eat a diet that includes plenty of vegetables, fruits, low-fat dairy products, and lean protein.  Do not eat a lot of foods that are high in solid fats, added sugars, or sodium. Maintain a healthy weight Body mass index (BMI) is used to identify weight problems. It estimates body fat based on height and weight. Your health care provider can help determine your BMI and help you achieve or maintain a healthy weight. Get regular exercise Get regular exercise. This is one of the most important things you can do for your health. Most adults should:  Exercise for at least 150 minutes each week. The exercise should increase your heart rate and make you sweat (moderate-intensity exercise).  Do strengthening exercises at least twice a week. This is in addition to the moderate-intensity exercise.  Spend less time sitting. Even light physical activity can be beneficial. Watch cholesterol and blood lipids Have your blood tested for lipids and cholesterol at 52 years of age, then have this test every 5 years. Have your cholesterol levels checked more often if:  Your lipid or cholesterol levels are high.  You are older than 52 years of age.  You are at high risk for heart disease. What should I know about cancer screening? Depending on your health history and family history, you may need to have cancer screening at various ages. This may include screening for:  Breast cancer.  Cervical cancer.  Colorectal cancer.  Skin cancer.  Lung cancer. What should I know about heart disease, diabetes, and high blood  pressure? Blood pressure and heart disease  High blood pressure causes heart disease and increases the risk of stroke. This is more likely to develop in people who have high blood pressure readings, are of African descent, or are overweight.  Have your blood pressure checked: ? Every 3-5 years if you are 18-39 years of age. ? Every year if you are 40 years old or older. Diabetes Have regular diabetes screenings. This checks your fasting blood sugar level. Have the screening done:  Once every three years after age 40 if you are at a normal weight and have a low risk for diabetes.  More often and at a younger age if you are overweight or have a high risk for diabetes. What should I know about preventing infection? Hepatitis B If you have a higher risk for hepatitis B, you should be screened for this virus. Talk with your health care provider to find out if you are at risk for hepatitis B infection. Hepatitis C Testing is recommended for:  Everyone born from 1945 through 1965.  Anyone with known risk factors for hepatitis C. Sexually transmitted infections (STIs)  Get screened for STIs, including gonorrhea and chlamydia, if: ? You are sexually active and are younger than 52 years of age. ? You are older than 52 years of age and your health care provider tells you that you are at risk for this type of infection. ? Your sexual activity has changed since you were last screened, and you are at increased risk for chlamydia or gonorrhea. Ask your health care provider if   you are at risk.  Ask your health care provider about whether you are at high risk for HIV. Your health care provider may recommend a prescription medicine to help prevent HIV infection. If you choose to take medicine to prevent HIV, you should first get tested for HIV. You should then be tested every 3 months for as long as you are taking the medicine. Pregnancy  If you are about to stop having your period (premenopausal) and  you may become pregnant, seek counseling before you get pregnant.  Take 400 to 800 micrograms (mcg) of folic acid every day if you become pregnant.  Ask for birth control (contraception) if you want to prevent pregnancy. Osteoporosis and menopause Osteoporosis is a disease in which the bones lose minerals and strength with aging. This can result in bone fractures. If you are 65 years old or older, or if you are at risk for osteoporosis and fractures, ask your health care provider if you should:  Be screened for bone loss.  Take a calcium or vitamin D supplement to lower your risk of fractures.  Be given hormone replacement therapy (HRT) to treat symptoms of menopause. Follow these instructions at home: Lifestyle  Do not use any products that contain nicotine or tobacco, such as cigarettes, e-cigarettes, and chewing tobacco. If you need help quitting, ask your health care provider.  Do not use street drugs.  Do not share needles.  Ask your health care provider for help if you need support or information about quitting drugs. Alcohol use  Do not drink alcohol if: ? Your health care provider tells you not to drink. ? You are pregnant, may be pregnant, or are planning to become pregnant.  If you drink alcohol: ? Limit how much you use to 0-1 drink a day. ? Limit intake if you are breastfeeding.  Be aware of how much alcohol is in your drink. In the [place] one drink equals one 12 oz bottle of beer (355 mL), one 5 oz glass of wine (148 mL), or one 1 oz glass of hard liquor (44 mL). General instructions  Schedule regular health, dental, and eye exams.  Stay current with your vaccines.  Tell your health care provider if: ? You often feel depressed. ? You have ever been abused or do not feel safe at home. Summary  Adopting a healthy lifestyle and getting preventive care are important in promoting health and wellness.  Follow your health care provider's instructions about healthy  diet, exercising, and getting tested or screened for diseases.  Follow your health care provider's instructions on monitoring your cholesterol and blood pressure. This information is not intended to replace advice given to you by your health care provider. Make sure you discuss any questions you have with your health care provider. Document Released: 06/01/2011 Document Revised: 11/09/2018 Document Reviewed: 11/09/2018 Elsevier Patient Education  2020 [employer]  

## 2019-08-28 ENCOUNTER — Telehealth: Payer: Self-pay | Admitting: Family

## 2019-08-28 MED ORDER — POTASSIUM CHLORIDE CRYS ER 20 MEQ PO TBCR: 40.0000 meq | EXTENDED_RELEASE_TABLET | Freq: Every day | ORAL | 5 refills | Status: DC

## 2019-08-28 NOTE — Telephone Encounter (Signed)
Reviewed BMP results from her recent PCP visit. Renal function looks good although her potassium level is low. She is currently taking 92meq potassium daily with additional potassium when she takes metolazone.   Advised patient that she needed to increase her daily potassium to 2 tablets daily (total of 38meq) and continue to take an additional 57meq when she takes metolazone.   She will let me know when she needs a new prescription sent in. Patient verbalized understanding.

## 2019-08-30 ENCOUNTER — Other Ambulatory Visit: Payer: Self-pay | Admitting: Pain Medicine

## 2019-08-31 ENCOUNTER — Telehealth: Payer: Self-pay

## 2019-08-31 DIAGNOSIS — Z952 Presence of prosthetic heart valve: Secondary | ICD-10-CM

## 2019-08-31 DIAGNOSIS — I5032 Chronic diastolic (congestive) heart failure: Secondary | ICD-10-CM

## 2019-08-31 IMAGING — CR DG SI JOINTS 3+V
1 series · 3 of 3 positions shown · non-contrast
Comparison: Abdominal radiograph July 20, 2018

CLINICAL DATA: Chronic sacroiliac pain.

EXAM:
BILATERAL SACROILIAC JOINTS - 3+ VIEW

[Series 1: dg si joints · 0.14mm/px · 3 of 3 slices shown]
[im 1/3]
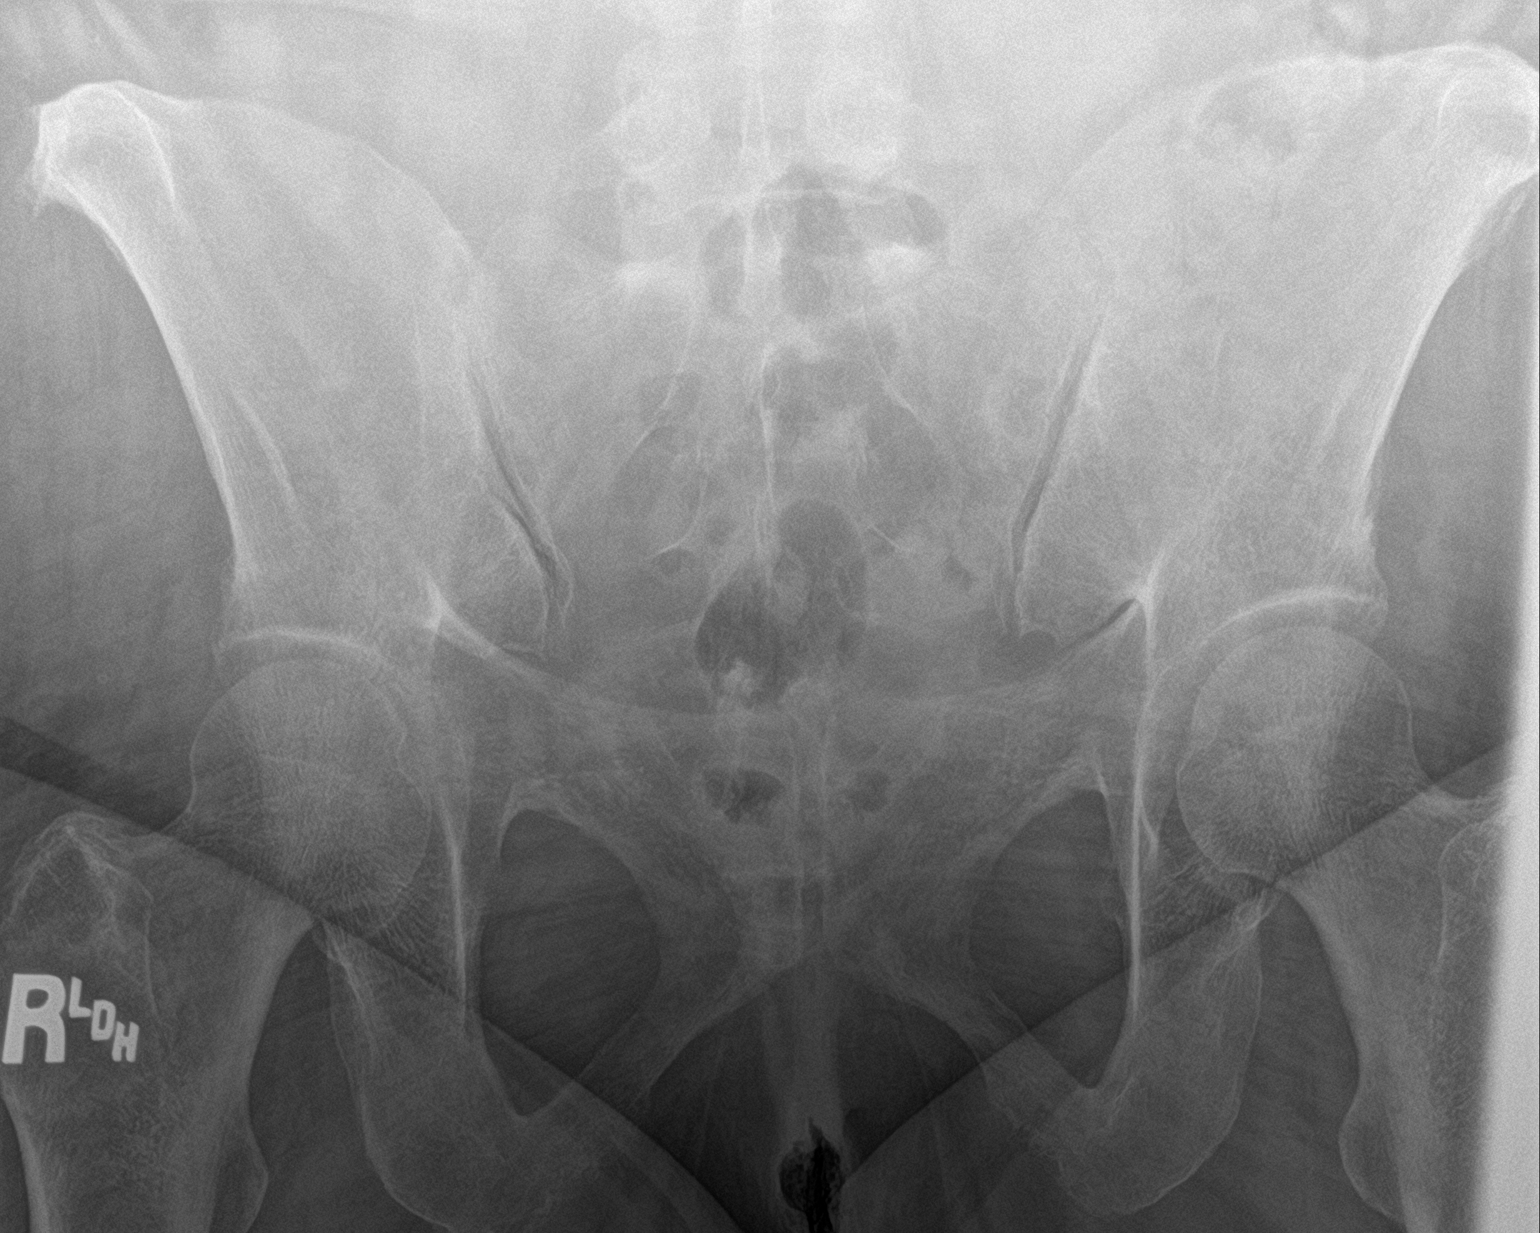
[im 2/3]
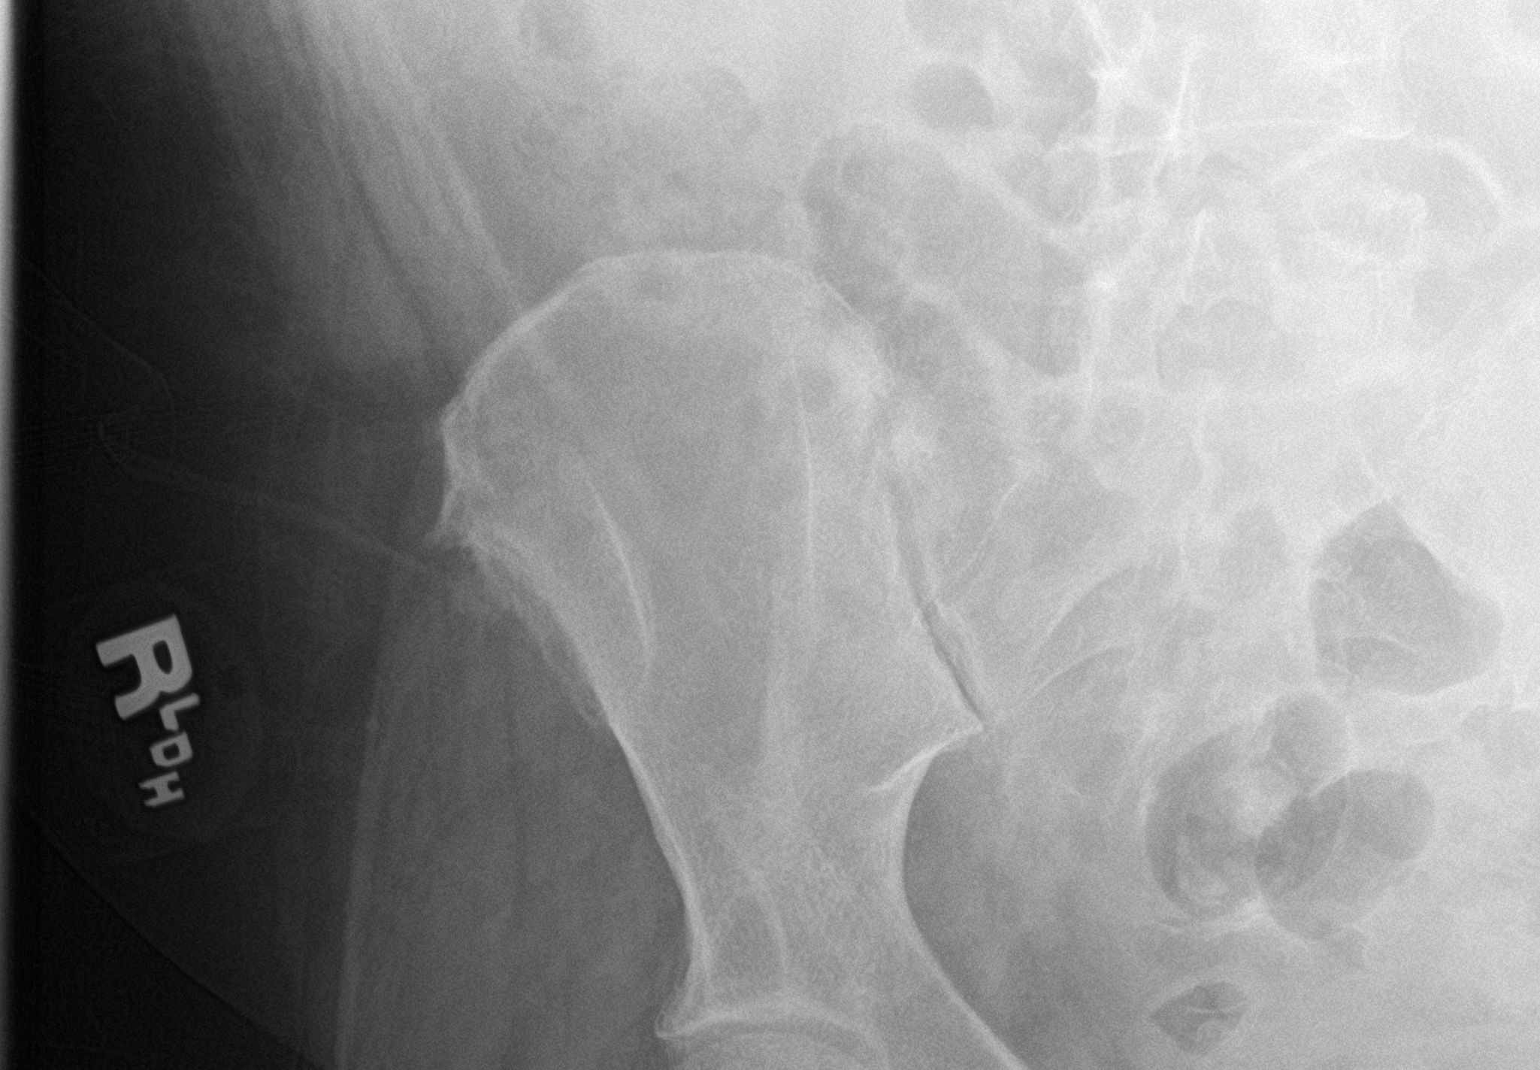
[im 3/3]
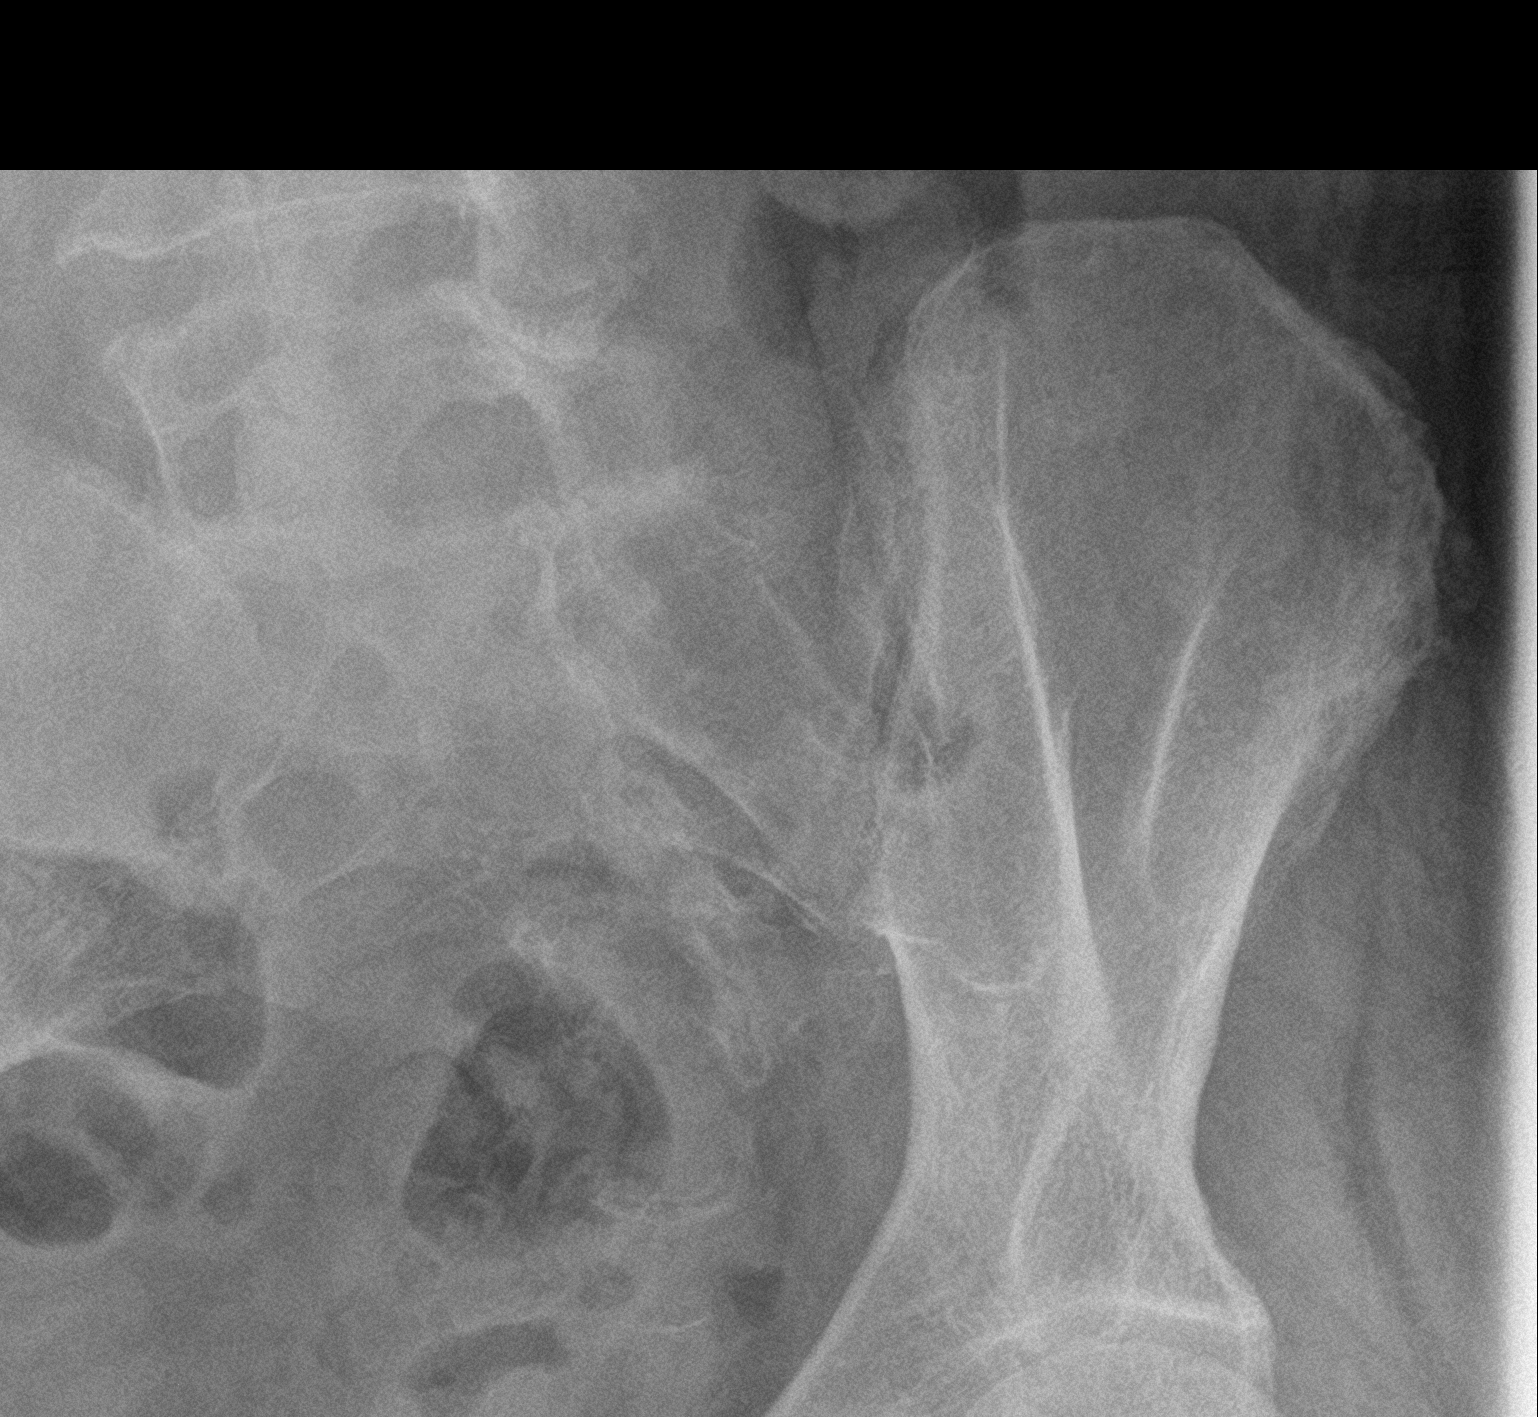

[3 of 3 positions shown; findings below may reference images not displayed]

FINDINGS: The sacroiliac joint spaces are maintained, mild symmetric
osteoarthrosis with undersurface spurring. No other bone
abnormalities are seen.
IMPRESSION: Mild bilateral sacroiliac osteoarthrosis.

## 2019-08-31 NOTE — Progress Notes (Signed)
Discharge Progress Report  Patient Details  Name: Chelsea Ramsey MRN: [id number] Date of Birth: 07/15/1966 Referring Provider:     Pulmonary Rehab from 01/16/2019 in [facility]  Referring Provider  Chelsea Cowman MD       Number of Visits: 6/36  Reason for Discharge:  Early Exit:  Lack of attendance  Smoking History:  Social History   Tobacco Use  Smoking Status Current Every Day Smoker  . Packs/day: 0.10  . Types: Cigarettes  Smokeless Tobacco Never Used  Tobacco Comment   4/20 was not able to quit.  Still at 1-3 a day. She would like to wait to set a new quit date until we get back to class to have the extra in person support    Diagnosis:  Heart failure, diastolic, chronic ([place])  S/P mitral valve replacement  ADL [facility]:   Initial Exercise Prescription:   Discharge Exercise Prescription (Final Exercise Prescription Changes):   Functional Capacity:   Psychological, QOL, Others - Outcomes: PHQ 2/9: Depression screen [facility] 2/9 08/24/2019 02/08/2019 01/16/2019 01/05/2019 11/14/2018  Decreased Interest 1 0 1 0 0  Down, Depressed, Hopeless 0 0 0 0 0  PHQ - 2 Score 1 0 1 0 0  Altered sleeping 3 1 2  - -  Tired, decreased energy 1 0 1 - -  Change in appetite 0 1 2 - -  Feeling bad or failure about yourself  0 0 0 - -  Trouble concentrating 0 0 0 - -  Moving slowly or fidgety/restless 0 0 0 - -  Suicidal thoughts 0 0 0 - -  PHQ-9 Score 5 2 6  - -  Difficult doing work/chores Not difficult at all Not difficult at all Not difficult at all - -    Quality of Life:   Personal Goals: Goals established at orientation with interventions provided to work toward goal.    Personal Goals Discharge: Goals and Risk Factor Review    Row Name 03/06/19 1004 03/20/19 1021 05/22/19 1348         Core Components/Risk Factors/Patient Goals Review   Personal Goals Review  Tobacco Cessation;Heart Failure;Weight Management/Obesity  Tobacco  Cessation;Heart Failure;Weight Management/Obesity  Tobacco Cessation;Heart Failure;Weight Management/Obesity     Review  Chelsea Ramsey continues to do well at home.  Her weight has been up and down but she had an appointment with heart failure clinic last week.  She is allowed to take some extra lasix to help.  She has really been working on watcing her salt intake.  She is still at one cigarette a day. It is first thing in the morning with her coffee.  She is determined to quit before returning to rehab.  Her new quit date is 03/20/19. That is two weeks from today.  Chelsea Ramsey has been doing well.  Her weight was up some today, but she took an extra lasix to help it get back down.  Other than her weight, she does not have any heart failure symptoms.  She did not hit her quit date and is still smoking her 1-3 cigaretts per day.  She would like to hold off on setting a new date until she gets back to class. She really wants to quit, but would like to have the in person support to help her reach that milestone.  She is eager to get back to class.  She continues to have a lot of stress that she is not comfortable discussing over the phone.  We will  continue to reach out to her.   Chelsea Ramsey is holding steady. She is still having telephone visits with her doctors and the HF clinic is planning to reopen next month.  She is still at 1-2 cigarettes and still wants to quit once back to class. She is eager to get back to class to get the help she wants in person.     Expected Outcomes  Short: Quit smoking by 03/20/19.  Long: Continue to manage heart failure.   Short: Continue to work on weight loss.  Long: Continue to manage heart failure and quit smoking.   Short: Continue to work on weight loss.  Long: Continue to manage heart failure and quit smoking.         Exercise Goals and Review:   Exercise Goals Re-Evaluation: Exercise Goals Re-Evaluation    Row Name 03/06/19 0956 03/20/19 1017 03/30/19 1119 05/22/19 1327       Exercise Goal  Re-Evaluation   Exercise Goals Review  Increase Physical Activity;Increase Strength and Stamina;Understanding of Exercise Prescription  Increase Physical Activity;Increase Strength and Stamina;Understanding of Exercise Prescription  Increase Physical Activity;Increase Strength and Stamina;Understanding of Exercise Prescription  Increase Physical Activity;Increase Strength and Stamina;Understanding of Exercise Prescription    Comments  Chelsea Ramsey continues to exercise at home. She has been getting out to walk and using our videos at home.  She is feeling good and read to get back to class.   Chelsea Ramsey continues to walk and do her videos at home.  She is doing well and feels good about her exercise.  She is eager to get back to class.   Chelsea Ramsey continues to walk daily and use our videos for exercise.  Today, her scars were hurting from the rain and she was not feeling 100%.  Chelsea Ramsey continues to walk at home.  She is doing well and eager to get back to classes, but doesn't feel safe to return just yet.    Expected Outcomes  Short: Continue to walk and use videos at home.  Long: Continue to increase activity levels.   Short: Continue to exercise daily.  Long: Continue to maintain strength and stamina.   Short: Continue to walk and use videos.  Long: Continue to maintain.   Short: Continue to walk and use videos.  Long: Continue to maintain.        Nutrition & Weight - Outcomes:    Nutrition: Nutrition Therapy & Goals - 03/08/19 1128      Nutrition Therapy   Diet  Heart Healthy, Low sodium diet    Protein (specify units)  80-90g    Fiber  25 grams    Whole Grain Foods  3 servings    Saturated Fats  12 max. grams    Fruits and Vegetables  5 servings/day    Sodium  1.5 grams      Personal Nutrition Goals   Nutrition Goal  ST: LT: Decrease soda, Lose weight (pt doesn't really want to make ST goals right now)    Comments  Pt doesn't eat any candy anymore. Pt doesn't eat B. L/D: meat, bread, vegetables. Pt doesn't have a  set eating schedule - that may help with her goals.  Pt eats grapes a lot instead of candy. Pt drinks about 4 a day. Pt reports that her diet varies. Keep salt down so the fluid is down. Pt reports that right now she is concerned with staying a live during [place] and does not want to concentrate on or talk about  nutrition goals or general nutrition as she is stressed.  Pt reports that she will try to write down what a normal day of eating looks like for her when COVID is not happening to pass the time.         Nutrition Discharge:   Education Questionnaire Score:   Goals reviewed with patient; copy given to patient.

## 2019-08-31 NOTE — Telephone Encounter (Signed)
I don't recall discussing this medication and it is not documented on my note. She is on multiple sedating medications and I don't feel comfortable filling this medication. She should discuss it with pain management.

## 2019-08-31 NOTE — Telephone Encounter (Signed)
Patient called stating that her pharmacy has tried contacting us for a refill for Tizanidine 4mg  at bedtime. Patient needs this refilled. Please review and advise.  This medication is not listed in patient's chart.   [place]

## 2019-08-31 NOTE — Progress Notes (Signed)
Pulmonary Individual Treatment Plan  Patient Details  Name: Chelsea Ramsey MRN: [id number] Date of Birth: Jun 18, 1966 Referring Provider:     Pulmonary Rehab from 01/16/2019 in [facility] Cardiac and Pulmonary Rehab  Referring Provider  Isaias Cowman MD      Initial Encounter Date:    Pulmonary Rehab from 01/16/2019 in [facility] Cardiac and Pulmonary Rehab  Date  01/16/19      Visit Diagnosis: Heart failure, diastolic, chronic ([place])  S/P mitral valve replacement  Patient's Home Medications on Admission:  Current Outpatient Medications:  .  albuterol (PROAIR HFA) 108 (90 Base) MCG/ACT inhaler, Inhale 1-2 puffs into the lungs every 6 (six) hours as needed for wheezing or shortness of breath., Disp: , Rfl:  .  allopurinol (ZYLOPRIM) 100 MG tablet, Take 1 tablet (100 mg total) by mouth 2 (two) times daily., Disp: 60 tablet, Rfl: 2 .  butalbital-acetaminophen-caffeine (FIORICET, ESGIC) 50-325-40 MG tablet, Take 1 tablet by mouth every 6 (six) hours as needed. , Disp: , Rfl:  .  Calcium Carbonate-Vit D-Min (GNP CALCIUM 1200) 1200-1000 MG-UNIT CHEW, Chew 1,200 mg by mouth daily with breakfast. Take in combination with vitamin D and magnesium., Disp: 90 tablet, Rfl: 0 .  Cholecalciferol (D 5000) 125 MCG (5000 UT) capsule, Take by mouth., Disp: , Rfl:  .  colchicine 0.6 MG tablet, Take 1 tablet (0.6 mg total) by mouth daily. As directed, Disp: 30 tablet, Rfl: 2 .  docusate sodium (COLACE) 100 MG capsule, Take 200 mg by mouth daily., Disp: , Rfl:  .  Fluticasone-Umeclidin-Vilant (TRELEGY ELLIPTA) 100-62.5-25 MCG/INH AEPB, Inhale 1 puff into the lungs every morning., Disp: 60 each, Rfl: 2 .  furosemide (LASIX) 40 MG tablet, Take 2 tablets twice daily (Patient taking differently: Take 2 tablets in the am and 1 tablet at night.), Disp: 90 tablet, Rfl: 5 .  gabapentin (NEURONTIN) 600 MG tablet, Take 0.5 tablets (300 mg total) by mouth 2 (two) times daily. (Patient taking differently: Take 600  mg by mouth 3 (three) times daily. ), Disp: 60 tablet, Rfl: 0 .  Galcanezumab-gnlm 120 MG/ML SOSY, Inject into the skin., Disp: , Rfl:  .  guaiFENesin (MUCINEX) 600 MG 12 hr tablet, Take 1 tablet (600 mg total) by mouth 2 (two) times daily., Disp: 20 tablet, Rfl: 0 .  ipratropium-albuterol (DUONEB) 0.5-2.5 (3) MG/3ML SOLN, Inhale 3 mLs into the lungs 3 (three) times daily as needed (respiratory)., Disp: 360 mL, Rfl: 1 .  lactulose (CHRONULAC) 10 GM/15ML solution, Take 45 mLs (30 g total) by mouth 2 (two) times daily as needed for mild constipation., Disp: 240 mL, Rfl: 0 .  meclizine (ANTIVERT) 12.5 MG tablet, Take 2 tablets (25 mg total) by mouth 3 (three) times daily as needed for dizziness., Disp: 30 tablet, Rfl: 0 .  metolazone (ZAROXOLYN) 2.5 MG tablet, Take 2.5 mg by mouth. Twice weekly, Disp: , Rfl:  .  metoprolol tartrate (LOPRESSOR) 100 MG tablet, Take 1 tablet (100 mg total) by mouth 2 (two) times daily., Disp: 60 tablet, Rfl: 3 .  nitroGLYCERIN (NITROSTAT) 0.4 MG SL tablet, Place 1 tablet (0.4 mg total) under the tongue every 5 (five) minutes as needed for chest pain., Disp: 30 tablet, Rfl: 12 .  nortriptyline (PAMELOR) 10 MG capsule, Take 50 mg by mouth at bedtime. , Disp: , Rfl:  .  nortriptyline (PAMELOR) 50 MG capsule, Take 50 mg by mouth at bedtime., Disp: , Rfl:  .  omeprazole (PRILOSEC) 20 MG capsule, Take 1 capsule (20 mg total)  by mouth daily., Disp: 30 capsule, Rfl: 3 .  Oxycodone HCl 10 MG TABS, Take 0.5-1 tablets (5-10 mg total) by mouth 2 (two) times daily as needed. Must last 30 days. MAX.: 2/day, Disp: 60 tablet, Rfl: 0 .  Oxycodone HCl 10 MG TABS, Take 0.5-1 tablets (5-10 mg total) by mouth 2 (two) times daily as needed. Must last 30 days. MAX.: 2/day, Disp: 60 tablet, Rfl: 0 .  Oxycodone HCl 10 MG TABS, Take 0.5-1 tablets (5-10 mg total) by mouth 2 (two) times daily as needed. Must last 30 days. MAX.: 2/day, Disp: 60 tablet, Rfl: 0 .  potassium chloride SA (K-DUR) 20 MEQ  tablet, Take 2 tablets (40 mEq total) by mouth daily. And additional tablet when taking metolazone, Disp: 60 tablet, Rfl: 5 .  promethazine (PHENERGAN) 12.5 MG tablet, Take 1 tablet (12.5 mg total) by mouth every 6 (six) hours as needed for nausea or vomiting., Disp: 30 tablet, Rfl: 0  Past Medical History: Past Medical History:  Diagnosis Date  . Acute drug-induced gout of left foot 03/01/2018   Last Assessment & Plan:  Likely at least partially brought on by diuresis.  Needs to continue to diurese  Will push hydration Stop allopurinol given initiation during acute flare may worsen this, re-broach this when asymptomatic Avoid nsaids given stomach pain Trial colchicine Add acetaminophen Ice, elevate, rest  . Allergy    seasonal  . Anxiety   . Arthritis    Right Knee  . Asthma   . CHF (congestive heart failure) ([place])   . COPD (chronic obstructive pulmonary disease) ([place])   . Coronary artery disease    Leaky heart valve  . Fibromyalgia   . GERD (gastroesophageal reflux disease)   . Hypertension   . Pneumonia   . PUD (peptic ulcer disease)   . Pulmonary HTN ([place])   . Rheumatic fever/heart disease   . Sleep apnea     Tobacco Use: Social History   Tobacco Use  Smoking Status Current Every Day Smoker  . Packs/day: 0.10  . Types: Cigarettes  Smokeless Tobacco Never Used  Tobacco Comment   4/20 was not able to quit.  Still at 1-3 a day. She would like to wait to set a new quit date until we get back to class to have the extra in person support    Labs: Recent Review Flowsheet Data    Labs for ITP Cardiac and Pulmonary Rehab Latest Ref Rng & Units 12/09/2013 04/03/2014 08/21/2014 12/20/2017 08/24/2019   Cholestrol 0 - 200 mg/dL 91 96 - - -   LDLCALC mg/dL 50 51 - - -   HDL 35 - 70 mg/dL 31(L) 32(A) - - -   Trlycerides 40 - 160 mg/dL 50 63 - - -   Hemoglobin A1c 4.8 - 5.6 % - - 5.2 - 6.2(H)   PHART 7.350 - 7.450 - - - 7.39 -   PCO2ART 32.0 - 48.0 mmHg - - - 41 -   HCO3 20.0 - 28.0  mmol/L - - - 24.8 -   ACIDBASEDEF 0.0 - 2.0 mmol/L - - - 0.2 -   O2SAT % - - - 98.4 -       Pulmonary Assessment Scores:   UCSD: Self-administered rating of dyspnea associated with activities of daily living (ADLs) 6-point scale (0 = "not at all" to 5 = "maximal or unable to do because of breathlessness")  Scoring Scores range from 0 to 120.  Minimally important difference is 5 units  CAT: CAT can identify the health impairment of COPD patients and is better correlated with disease progression.  CAT has a scoring range of zero to 40. The CAT score is classified into four groups of low (less than 10), medium (10 - 20), high (21-30) and very high (31-40) based on the impact level of disease on health status. A CAT score over 10 suggests significant symptoms.  A worsening CAT score could be explained by an exacerbation, poor medication adherence, poor inhaler technique, or progression of COPD or comorbid conditions.  CAT MCID is 2 points  mMRC: mMRC (Modified Medical Research Council) Dyspnea Scale is used to assess the degree of baseline functional disability in patients of respiratory disease due to dyspnea. No minimal important difference is established. A decrease in score of 1 point or greater is considered a positive change.   Pulmonary Function Assessment:   Exercise Target Goals: Exercise Program Goal: Individual exercise prescription set using results from initial 6 min walk test and THRR while considering  patient's activity barriers and safety.   Exercise Prescription Goal: Initial exercise prescription builds to 30-45 minutes a day of aerobic activity, 2-3 days per week.  Home exercise guidelines will be given to patient during program as part of exercise prescription that the participant will acknowledge.  Activity Barriers & Risk Stratification:   6 Minute Walk:  Oxygen Initial Assessment:   Oxygen Re-Evaluation: Oxygen Re-Evaluation    Row Name 03/30/19 1122              Program Oxygen Prescription   Program Oxygen Prescription  None         Home Oxygen   Home Oxygen Device  Portable Concentrator;Home Concentrator       Sleep Oxygen Prescription  Continuous       Liters per minute  3       Home Exercise Oxygen Prescription  None       Home at Rest Exercise Oxygen Prescription  None       Compliance with Home Oxygen Use  No         Goals/Expected Outcomes   Short Term Goals  To learn and understand importance of monitoring SPO2 with pulse oximeter and demonstrate accurate use of the pulse oximeter.;To learn and understand importance of maintaining oxygen saturations>88%;To learn and demonstrate proper pursed lip breathing techniques or other breathing techniques.;To learn and demonstrate proper use of respiratory medications       Long  Term Goals  Verbalizes importance of monitoring SPO2 with pulse oximeter and return demonstration;Maintenance of O2 saturations>88%;Exhibits proper breathing techniques, such as pursed lip breathing or other method taught during program session;Compliance with respiratory medication;Demonstrates proper use of MDI's       Comments  Chelsea Ramsey continues to not need her oxygen at home.  Her breathing has been pretty good and she is using her PLB>        Goals/Expected Outcomes  Short: Continue to use PLB and check satuartions.  Long: Continue to manage breathing.           Oxygen Discharge (Final Oxygen Re-Evaluation): Oxygen Re-Evaluation - 03/30/19 1122      Program Oxygen Prescription   Program Oxygen Prescription  None      Home Oxygen   Home Oxygen Device  Portable Concentrator;Home Concentrator    Sleep Oxygen Prescription  Continuous    Liters per minute  3    Home Exercise Oxygen Prescription  None    Home at Rest Exercise  Oxygen Prescription  None    Compliance with Home Oxygen Use  No      Goals/Expected Outcomes   Short Term Goals  To learn and understand importance of monitoring SPO2 with pulse  oximeter and demonstrate accurate use of the pulse oximeter.;To learn and understand importance of maintaining oxygen saturations>88%;To learn and demonstrate proper pursed lip breathing techniques or other breathing techniques.;To learn and demonstrate proper use of respiratory medications    Long  Term Goals  Verbalizes importance of monitoring SPO2 with pulse oximeter and return demonstration;Maintenance of O2 saturations>88%;Exhibits proper breathing techniques, such as pursed lip breathing or other method taught during program session;Compliance with respiratory medication;Demonstrates proper use of MDI's    Comments  Chelsea Ramsey continues to not need her oxygen at home.  Her breathing has been pretty good and she is using her PLB>     Goals/Expected Outcomes  Short: Continue to use PLB and check satuartions.  Long: Continue to manage breathing.        Initial Exercise Prescription:   Perform Capillary Blood Glucose checks as needed.  Exercise Prescription Changes:   Exercise Comments:   Exercise Goals and Review:   Exercise Goals Re-Evaluation : Exercise Goals Re-Evaluation    Row Name 03/06/19 0956 03/20/19 1017 03/30/19 1119 05/22/19 1327       Exercise Goal Re-Evaluation   Exercise Goals Review  Increase Physical Activity;Increase Strength and Stamina;Understanding of Exercise Prescription  Increase Physical Activity;Increase Strength and Stamina;Understanding of Exercise Prescription  Increase Physical Activity;Increase Strength and Stamina;Understanding of Exercise Prescription  Increase Physical Activity;Increase Strength and Stamina;Understanding of Exercise Prescription    Comments  Chelsea Ramsey continues to exercise at home. She has been getting out to walk and using our videos at home.  She is feeling good and read to get back to class.   Chelsea Ramsey continues to walk and do her videos at home.  She is doing well and feels good about her exercise.  She is eager to get back to class.   Chelsea Ramsey  continues to walk daily and use our videos for exercise.  Today, her scars were hurting from the rain and she was not feeling 100%.  Chelsea Ramsey continues to walk at home.  She is doing well and eager to get back to classes, but doesn't feel safe to return just yet.    Expected Outcomes  Short: Continue to walk and use videos at home.  Long: Continue to increase activity levels.   Short: Continue to exercise daily.  Long: Continue to maintain strength and stamina.   Short: Continue to walk and use videos.  Long: Continue to maintain.   Short: Continue to walk and use videos.  Long: Continue to maintain.        Discharge Exercise Prescription (Final Exercise Prescription Changes):   Nutrition:  Target Goals: Understanding of nutrition guidelines, daily intake of sodium <1562m, cholesterol <2050m calories 30% from fat and 7% or less from saturated fats, daily to have 5 or more servings of fruits and vegetables.  Biometrics:    Nutrition Therapy Plan and Nutrition Goals: Nutrition Therapy & Goals - 03/08/19 1128      Nutrition Therapy   Diet  Heart Healthy, Low sodium diet    Protein (specify units)  80-90g    Fiber  25 grams    Whole Grain Foods  3 servings    Saturated Fats  12 max. grams    Fruits and Vegetables  5 servings/day    Sodium  1.5  grams      Personal Nutrition Goals   Nutrition Goal  ST: LT: Decrease soda, Lose weight (pt doesn't really want to make ST goals right now)    Comments  Pt doesn't eat any candy anymore. Pt doesn't eat B. L/D: meat, bread, vegetables. Pt doesn't have a set eating schedule - that may help with her goals.  Pt eats grapes a lot instead of candy. Pt drinks about 4 a day. Pt reports that her diet varies. Keep salt down so the fluid is down. Pt reports that right now she is concerned with staying a live during [place] and does not want to concentrate on or talk about nutrition goals or general nutrition as she is stressed.  Pt reports that she will try to write  down what a normal day of eating looks like for her when COVID is not happening to pass the time.         Nutrition Assessments:   Nutrition Goals Re-Evaluation:   Nutrition Goals Discharge (Final Nutrition Goals Re-Evaluation):   Psychosocial: Target Goals: Acknowledge presence or absence of significant depression and/or stress, maximize coping skills, provide positive support system. Participant is able to verbalize types and ability to use techniques and skills needed for reducing stress and depression.   Initial Review & Psychosocial Screening:   Quality of Life Scores:  Scores of 19 and below usually indicate a poorer quality of life in these areas.  A difference of  2-3 points is a clinically meaningful difference.  A difference of 2-3 points in the total score of the Quality of Life Index has been associated with significant improvement in overall quality of life, self-image, physical symptoms, and general health in studies assessing change in quality of life.  PHQ-9: Recent Review Flowsheet Data    Depression screen [facility] 2/9 08/24/2019 02/08/2019 01/16/2019 01/05/2019 11/14/2018   Decreased Interest 1 0 1 0 0   Down, Depressed, Hopeless 0 0 0 0 0   PHQ - 2 Score 1 0 1 0 0   Altered sleeping 3 1 2  - -   Tired, decreased energy 1 0 1 - -   Change in appetite 0 1 2 - -   Feeling bad or failure about yourself  0 0 0 - -   Trouble concentrating 0 0 0 - -   Moving slowly or fidgety/restless 0 0 0 - -   Suicidal thoughts 0 0 0 - -   PHQ-9 Score 5 2 6  - -   Difficult doing work/chores Not difficult at all Not difficult at all Not difficult at all - -     Interpretation of Total Score  Total Score Depression Severity:  1-4 = Minimal depression, 5-9 = Mild depression, 10-14 = Moderate depression, 15-19 = Moderately severe depression, 20-27 = Severe depression   Psychosocial Evaluation and Intervention:   Psychosocial Re-Evaluation: Psychosocial Re-Evaluation    Row Name  03/06/19 0958 03/20/19 1019 03/30/19 1119 05/22/19 1331       Psychosocial Re-Evaluation   Current issues with  History of Depression;Current Stress Concerns  History of Depression;Current Stress Concerns  History of Depression;Current Stress Concerns  History of Depression;Current Stress Concerns;Current Depression    Comments  Chelsea Ramsey is doing well at home.  She is ready for everything to be over!  She is planning to attend the [place] meeting for [facility] tomorrow to connect with some of her friends.   Chelsea Ramsey is doing okay at home.  She continues to have  ongoing stress issues.  She did not feel comfortable talking about them over the phone.  She is still struggling with quitting smoking and would like to wait until back to class to attempt another quit date.  She is able to get out to exercise and was heading out now to get your prescriptions and pay bills.  She is doing the best she can and eager to get back to class again.   Chelsea Ramsey was doing okay.  She was in bed today, hurting from the rain and her scars.  She is hoping that once the weather clears, she will feel better.  She is still smoking to cope.  I encouraged her to make plans to attend [facility] on 5/12.  Chelsea Ramsey is doing okay at home.  She continues to have ongoing issues with her depression and family issues.  She would like to wait to discuss fully once able to do so.  She will be celebrating her birthday on Sunday!! Her plan is to have a lazy day in bed.    Expected Outcomes  Short: Attend [employer] meeting.  Long: Continue to cope postiviely.   Short: Continue to get out to exercise for mood boost.  Long: Continue to check in with staff and stay positive.   Short: Continue to exercise.  Long: Continue to check in with stay and stay positive.   Short: Enjoy her birthday. Long: Continue to connect and stay positive.    Interventions  Encouraged to attend Pulmonary Rehabilitation for the exercise  Encouraged to attend Pulmonary Rehabilitation for the  exercise  Encouraged to attend Pulmonary Rehabilitation for the exercise  -    Continue Psychosocial Services   Follow up required by staff  Follow up required by staff  Follow up required by staff  -       Psychosocial Discharge (Final Psychosocial Re-Evaluation): Psychosocial Re-Evaluation - 05/22/19 1331      Psychosocial Re-Evaluation   Current issues with  History of Depression;Current Stress Concerns;Current Depression    Comments  Chelsea Ramsey is doing okay at home.  She continues to have ongoing issues with her depression and family issues.  She would like to wait to discuss fully once able to do so.  She will be celebrating her birthday on Sunday!! Her plan is to have a lazy day in bed.    Expected Outcomes  Short: Enjoy her birthday. Long: Continue to connect and stay positive.       Education: Education Goals: Education classes will be provided on a weekly basis, covering required topics. Participant will state understanding/return demonstration of topics presented.  Learning Barriers/Preferences:   Education Topics:  Initial Evaluation Education: - Verbal, written and demonstration of respiratory meds, oximetry and breathing techniques. Instruction on use of nebulizers and MDIs and importance of monitoring MDI activations.   General Nutrition Guidelines/Fats and Fiber: -Group instruction provided by verbal, written material, models and posters to present the general guidelines for heart healthy nutrition. Gives an explanation and review of dietary fats and fiber.   Controlling Sodium/Reading Food Labels: -Group verbal and written material supporting the discussion of sodium use in heart healthy nutrition. Review and explanation with models, verbal and written materials for utilization of the food label.   Exercise Physiology & General Exercise Guidelines: - Group verbal and written instruction with models to review the exercise physiology of the cardiovascular system and  associated critical values. Provides general exercise guidelines with specific guidelines to those with heart or lung disease.  Aerobic Exercise & Resistance Training: - Gives group verbal and written instruction on the various components of exercise. Focuses on aerobic and resistive training programs and the benefits of this training and how to safely progress through these programs.   Flexibility, Balance, Mind/Body Relaxation: Provides group verbal/written instruction on the benefits of flexibility and balance training, including mind/body exercise modes such as yoga, pilates and tai chi.  Demonstration and skill practice provided.   Stress and Anxiety: - Provides group verbal and written instruction about the health risks of elevated stress and causes of high stress.  Discuss the correlation between heart/lung disease and anxiety and treatment options. Review healthy ways to manage with stress and anxiety.   Pulmonary Rehab from 02/08/2019 in [facility] Cardiac and Pulmonary Rehab  Date  09/13/18  Educator  [facility]  Instruction Review Code  1- Verbalizes Understanding      Depression: - Provides group verbal and written instruction on the correlation between heart/lung disease and depressed mood, treatment options, and the stigmas associated with seeking treatment.   Pulmonary Rehab from 02/08/2019 in [facility] Cardiac and Pulmonary Rehab  Date  02/01/19  Educator  [place]  Instruction Review Code  1- Verbalizes Understanding      Exercise & Equipment Safety: - Individual verbal instruction and demonstration of equipment use and safety with use of the equipment.   Pulmonary Rehab from 02/08/2019 in [facility] Cardiac and Pulmonary Rehab  Date  08/25/18  Educator  [facility]  Instruction Review Code  1- Verbalizes Understanding      Infection Prevention: - Provides verbal and written material to individual with discussion of infection control including proper hand washing and proper equipment cleaning during  exercise session.   Pulmonary Rehab from 02/08/2019 in [facility] Cardiac and Pulmonary Rehab  Date  08/25/18  Educator  [facility]  Instruction Review Code  1- Verbalizes Understanding      Falls Prevention: - Provides verbal and written material to individual with discussion of falls prevention and safety.   Pulmonary Rehab from 02/08/2019 in [facility] Cardiac and Pulmonary Rehab  Date  08/25/18  Educator  [facility]  Instruction Review Code  1- Verbalizes Understanding      Diabetes: - Individual verbal and written instruction to review signs/symptoms of diabetes, desired ranges of glucose level fasting, after meals and with exercise. Advice that pre and post exercise glucose checks will be done for 3 sessions at entry of program.   Chronic Lung Diseases: - Group verbal and written instruction to review updates, respiratory medications, advancements in procedures and treatments. Discuss use of supplemental oxygen including available portable oxygen systems, continuous and intermittent flow rates, concentrators, personal use and safety guidelines. Review proper use of inhaler and spacers. Provide informative websites for self-education.    Energy Conservation: - Provide group verbal and written instruction for methods to conserve energy, plan and organize activities. Instruct on pacing techniques, use of adaptive equipment and posture/positioning to relieve shortness of breath.   Triggers and Exacerbations: - Group verbal and written instruction to review types of environmental triggers and ways to prevent exacerbations. Discuss weather changes, air quality and the benefits of nasal washing. Review warning signs and symptoms to help prevent infections. Discuss techniques for effective airway clearance, coughing, and vibrations.   AED/CPR: - Group verbal and written instruction with the use of models to demonstrate the basic use of the AED with the basic ABC's of resuscitation.   Anatomy and Physiology of  the Lungs: - Group verbal and written instruction with  the use of models to provide basic lung anatomy and physiology related to function, structure and complications of lung disease.   Pulmonary Rehab from 02/08/2019 in [facility] Cardiac and Pulmonary Rehab  Date  02/08/19  Educator  [facility]  Instruction Review Code  1- Verbalizes Understanding      Anatomy & Physiology of the Heart: - Group verbal and written instruction and models provide basic cardiac anatomy and physiology, with the coronary electrical and arterial systems. Review of Valvular disease and Heart Failure   Cardiac Medications: - Group verbal and written instruction to review commonly prescribed medications for heart disease. Reviews the medication, class of the drug, and side effects.   Know Your Numbers and Risk Factors: -Group verbal and written instruction about important numbers in your health.  Discussion of what are risk factors and how they play a role in the disease process.  Review of Cholesterol, Blood Pressure, Diabetes, and BMI and the role they play in your overall health.   Pulmonary Rehab from 02/08/2019 in [facility] Cardiac and Pulmonary Rehab  Date  09/08/18  Educator  CE  Instruction Review Code  1- Verbalizes Understanding      Sleep Hygiene: -Provides group verbal and written instruction about how sleep can affect your health.  Define sleep hygiene, discuss sleep cycles and impact of sleep habits. Review good sleep hygiene tips.    Pulmonary Rehab from 02/08/2019 in [facility] Cardiac and Pulmonary Rehab  Date  10/11/18  Educator  [facility]  Instruction Review Code  1- Verbalizes Understanding      Other: -Provides group and verbal instruction on various topics (see comments)    Knowledge Questionnaire Score:    Core Components/Risk Factors/Patient Goals at Admission:   Core Components/Risk Factors/Patient Goals Review:  Goals and Risk Factor Review    Row Name 03/06/19 1004 03/20/19 1021 05/22/19 1348          Core Components/Risk Factors/Patient Goals Review   Personal Goals Review  Tobacco Cessation;Heart Failure;Weight Management/Obesity  Tobacco Cessation;Heart Failure;Weight Management/Obesity  Tobacco Cessation;Heart Failure;Weight Management/Obesity     Review  Chelsea Ramsey continues to do well at home.  Her weight has been up and down but she had an appointment with heart failure clinic last week.  She is allowed to take some extra lasix to help.  She has really been working on watcing her salt intake.  She is still at one cigarette a day. It is first thing in the morning with her coffee.  She is determined to quit before returning to rehab.  Her new quit date is 03/20/19. That is two weeks from today.  Chelsea Ramsey has been doing well.  Her weight was up some today, but she took an extra lasix to help it get back down.  Other than her weight, she does not have any heart failure symptoms.  She did not hit her quit date and is still smoking her 1-3 cigaretts per day.  She would like to hold off on setting a new date until she gets back to class. She really wants to quit, but would like to have the in person support to help her reach that milestone.  She is eager to get back to class.  She continues to have a lot of stress that she is not comfortable discussing over the phone.  We will continue to reach out to her.   Chelsea Ramsey is holding steady. She is still having telephone visits with her doctors and the HF clinic is planning to reopen  next month.  She is still at 1-2 cigarettes and still wants to quit once back to class. She is eager to get back to class to get the help she wants in person.     Expected Outcomes  Short: Quit smoking by 03/20/19.  Long: Continue to manage heart failure.   Short: Continue to work on weight loss.  Long: Continue to manage heart failure and quit smoking.   Short: Continue to work on weight loss.  Long: Continue to manage heart failure and quit smoking.         Core Components/Risk Factors/Patient  Goals at Discharge (Final Review):  Goals and Risk Factor Review - 05/22/19 1348      Core Components/Risk Factors/Patient Goals Review   Personal Goals Review  Tobacco Cessation;Heart Failure;Weight Management/Obesity    Review  Chelsea Ramsey is holding steady. She is still having telephone visits with her doctors and the HF clinic is planning to reopen next month.  She is still at 1-2 cigarettes and still wants to quit once back to class. She is eager to get back to class to get the help she wants in person.    Expected Outcomes  Short: Continue to work on weight loss.  Long: Continue to manage heart failure and quit smoking.        ITP Comments: ITP Comments    Row Name 03/06/19 1020 08/31/19 1014         ITP Comments  30 day review completed. ITP sent to [name] for review,changes as needed and signature. Continue with ITP unless changes directed by Dr. Sabra Heck.   Patient discharged. Discharge ITP sent to [name] director of [place].         Comments: Discharge ITP

## 2019-08-31 NOTE — Progress Notes (Signed)
Discharge Progress Report  Patient Details  Name: Chelsea Ramsey MRN: [id number] Date of Birth: 15-Feb-1966 Referring Provider:     Pulmonary Rehab from 01/16/2019 in [facility] Cardiac and Pulmonary Rehab  Referring Provider  Chelsea Cowman MD       Number of Visits: 6/36  Reason for Discharge:  Early Exit:  Lack of attendance  Smoking History:  Social History   Tobacco Use  Smoking Status Current Every Day Smoker  . Packs/day: 0.10  . Types: Cigarettes  Smokeless Tobacco Never Used  Tobacco Comment   4/20 was not able to quit.  Still at 1-3 a day. She would like to wait to set a new quit date until we get back to class to have the extra in person support    Diagnosis:  Heart failure, diastolic, chronic ([place])  S/P mitral valve replacement  ADL [facility]:   Initial Exercise Prescription:   Discharge Exercise Prescription (Final Exercise Prescription Changes):   Functional Capacity:   Psychological, QOL, Others - Outcomes: PHQ 2/9: Depression screen [facility] 2/9 08/24/2019 02/08/2019 01/16/2019 01/05/2019 11/14/2018  Decreased Interest 1 0 1 0 0  Down, Depressed, Hopeless 0 0 0 0 0  PHQ - 2 Score 1 0 1 0 0  Altered sleeping 3 1 2  - -  Tired, decreased energy 1 0 1 - -  Change in appetite 0 1 2 - -  Feeling bad or failure about yourself  0 0 0 - -  Trouble concentrating 0 0 0 - -  Moving slowly or fidgety/restless 0 0 0 - -  Suicidal thoughts 0 0 0 - -  PHQ-9 Score 5 2 6  - -  Difficult doing work/chores Not difficult at all Not difficult at all Not difficult at all - -    Quality of Life:   Personal Goals: Goals established at orientation with interventions provided to work toward goal.    Personal Goals Discharge: Goals and Risk Factor Review    Row Name 03/06/19 1004 03/20/19 1021 05/22/19 1348         Core Components/Risk Factors/Patient Goals Review   Personal Goals Review  Tobacco Cessation;Heart Failure;Weight Management/Obesity  Tobacco  Cessation;Heart Failure;Weight Management/Obesity  Tobacco Cessation;Heart Failure;Weight Management/Obesity     Review  Chelsea Ramsey continues to do well at home.  Her weight has been up and down but she had an appointment with heart failure clinic last week.  She is allowed to take some extra lasix to help.  She has really been working on watcing her salt intake.  She is still at one cigarette a day. It is first thing in the morning with her coffee.  She is determined to quit before returning to rehab.  Her new quit date is 03/20/19. That is two weeks from today.  Chelsea Ramsey has been doing well.  Her weight was up some today, but she took an extra lasix to help it get back down.  Other than her weight, she does not have any heart failure symptoms.  She did not hit her quit date and is still smoking her 1-3 cigaretts per day.  She would like to hold off on setting a new date until she gets back to class. She really wants to quit, but would like to have the in person support to help her reach that milestone.  She is eager to get back to class.  She continues to have a lot of stress that she is not comfortable discussing over the phone.  We will  continue to reach out to her.   Chelsea Ramsey is holding steady. She is still having telephone visits with her doctors and the HF clinic is planning to reopen next month.  She is still at 1-2 cigarettes and still wants to quit once back to class. She is eager to get back to class to get the help she wants in person.     Expected Outcomes  Short: Quit smoking by 03/20/19.  Long: Continue to manage heart failure.   Short: Continue to work on weight loss.  Long: Continue to manage heart failure and quit smoking.   Short: Continue to work on weight loss.  Long: Continue to manage heart failure and quit smoking.         Exercise Goals and Review:   Exercise Goals Re-Evaluation: Exercise Goals Re-Evaluation    Row Name 03/06/19 0956 03/20/19 1017 03/30/19 1119 05/22/19 1327       Exercise Goal  Re-Evaluation   Exercise Goals Review  Increase Physical Activity;Increase Strength and Stamina;Understanding of Exercise Prescription  Increase Physical Activity;Increase Strength and Stamina;Understanding of Exercise Prescription  Increase Physical Activity;Increase Strength and Stamina;Understanding of Exercise Prescription  Increase Physical Activity;Increase Strength and Stamina;Understanding of Exercise Prescription    Comments  Chelsea Ramsey continues to exercise at home. She has been getting out to walk and using our videos at home.  She is feeling good and read to get back to class.   Chelsea Ramsey continues to walk and do her videos at home.  She is doing well and feels good about her exercise.  She is eager to get back to class.   Chelsea Ramsey continues to walk daily and use our videos for exercise.  Today, her scars were hurting from the rain and she was not feeling 100%.  Chelsea Ramsey continues to walk at home.  She is doing well and eager to get back to classes, but doesn't feel safe to return just yet.    Expected Outcomes  Short: Continue to walk and use videos at home.  Long: Continue to increase activity levels.   Short: Continue to exercise daily.  Long: Continue to maintain strength and stamina.   Short: Continue to walk and use videos.  Long: Continue to maintain.   Short: Continue to walk and use videos.  Long: Continue to maintain.        Nutrition & Weight - Outcomes:    Nutrition: Nutrition Therapy & Goals - 03/08/19 1128      Nutrition Therapy   Diet  Heart Healthy, Low sodium diet    Protein (specify units)  80-90g    Fiber  25 grams    Whole Grain Foods  3 servings    Saturated Fats  12 max. grams    Fruits and Vegetables  5 servings/day    Sodium  1.5 grams      Personal Nutrition Goals   Nutrition Goal  ST: LT: Decrease soda, Lose weight (pt doesn't really want to make ST goals right now)    Comments  Pt doesn't eat any candy anymore. Pt doesn't eat B. L/D: meat, bread, vegetables. Pt doesn't have a  set eating schedule - that may help with her goals.  Pt eats grapes a lot instead of candy. Pt drinks about 4 a day. Pt reports that her diet varies. Keep salt down so the fluid is down. Pt reports that right now she is concerned with staying a live during [place] and does not want to concentrate on or talk about  nutrition goals or general nutrition as she is stressed.  Pt reports that she will try to write down what a normal day of eating looks like for her when COVID is not happening to pass the time.         Nutrition Discharge:   Education Questionnaire Score:   Goals reviewed with patient; copy given to patient.

## 2019-09-01 ENCOUNTER — Encounter: Payer: Self-pay | Admitting: Physician Assistant

## 2019-09-01 DIAGNOSIS — K219 Gastro-esophageal reflux disease without esophagitis: Secondary | ICD-10-CM

## 2019-09-01 NOTE — Telephone Encounter (Signed)
Pt advised.  She states she will call the Pain Doctor.   Thanks,   -Mickel Baas

## 2019-09-04 ENCOUNTER — Telehealth: Payer: Self-pay | Admitting: Physician Assistant

## 2019-09-05 MED ORDER — OMEPRAZOLE 20 MG PO CPDR: 20.0000 mg | DELAYED_RELEASE_CAPSULE | Freq: Two times a day (BID) | ORAL | 0 refills | Status: DC

## 2019-09-05 MED ORDER — OMEPRAZOLE 20 MG PO CPDR: 40.0000 mg | DELAYED_RELEASE_CAPSULE | Freq: Two times a day (BID) | ORAL | 0 refills | Status: DC

## 2019-09-12 ENCOUNTER — Other Ambulatory Visit: Payer: Self-pay | Admitting: Physician Assistant

## 2019-09-12 ENCOUNTER — Encounter: Payer: Self-pay | Admitting: Pain Medicine

## 2019-09-12 DIAGNOSIS — R42 Dizziness and giddiness: Secondary | ICD-10-CM

## 2019-09-12 NOTE — Telephone Encounter (Signed)
Please advise refill? 

## 2019-09-13 ENCOUNTER — Ambulatory Visit: Payer: Medicaid Other | Admitting: Family

## 2019-09-13 ENCOUNTER — Other Ambulatory Visit: Payer: Self-pay

## 2019-09-13 ENCOUNTER — Ambulatory Visit: Payer: Medicaid Other | Attending: Pain Medicine | Admitting: Pain Medicine

## 2019-09-13 DIAGNOSIS — G894 Chronic pain syndrome: Secondary | ICD-10-CM

## 2019-09-13 DIAGNOSIS — G8929 Other chronic pain: Secondary | ICD-10-CM

## 2019-09-13 DIAGNOSIS — M1A9XX0 Chronic gout, unspecified, without tophus (tophi): Secondary | ICD-10-CM

## 2019-09-13 DIAGNOSIS — M79605 Pain in left leg: Secondary | ICD-10-CM | POA: Diagnosis not present

## 2019-09-13 DIAGNOSIS — M7918 Myalgia, other site: Secondary | ICD-10-CM

## 2019-09-13 DIAGNOSIS — E559 Vitamin D deficiency, unspecified: Secondary | ICD-10-CM

## 2019-09-13 DIAGNOSIS — Z96651 Presence of right artificial knee joint: Secondary | ICD-10-CM

## 2019-09-13 DIAGNOSIS — R0789 Other chest pain: Secondary | ICD-10-CM

## 2019-09-13 DIAGNOSIS — M545 Low back pain, unspecified: Secondary | ICD-10-CM

## 2019-09-13 DIAGNOSIS — M797 Fibromyalgia: Secondary | ICD-10-CM

## 2019-09-13 DIAGNOSIS — M25561 Pain in right knee: Secondary | ICD-10-CM

## 2019-09-13 MED ORDER — OXYCODONE HCL 10 MG PO TABS: 5.0000 mg | ORAL_TABLET | Freq: Two times a day (BID) | ORAL | 0 refills | Status: DC | PRN

## 2019-09-13 MED ORDER — MECLIZINE HCL 12.5 MG PO TABS: 25.0000 mg | ORAL_TABLET | Freq: Three times a day (TID) | ORAL | 0 refills | Status: DC | PRN

## 2019-09-13 MED ORDER — GNP CALCIUM 1200 1200-1000 MG-UNIT PO CHEW: 1200.0000 mg | CHEWABLE_TABLET | Freq: Every day | ORAL | 3 refills | Status: DC

## 2019-09-13 MED ORDER — COLCHICINE 0.6 MG PO TABS: 0.6000 mg | ORAL_TABLET | Freq: Every day | ORAL | 5 refills | Status: DC

## 2019-09-13 MED ORDER — ALLOPURINOL 100 MG PO TABS: 100.0000 mg | ORAL_TABLET | Freq: Two times a day (BID) | ORAL | 5 refills | Status: DC

## 2019-09-13 NOTE — Telephone Encounter (Signed)
Pt called inquiring if rx has been sent to the pharmacy.   Con Memos

## 2019-09-13 NOTE — Progress Notes (Signed)
Pain Management Virtual Encounter Note - Virtual Visit via Telephone Telehealth (real-time audio visits between healthcare provider and patient).   Patient's Phone No. & Preferred Pharmacy:  [phone] (home); [phone] (mobile); (Preferred) [phone] [email or web address]  [place], [address] [place] [address] Phone: [phone] Fax: [phone]    Pre-screening note:  Our staff contacted Chelsea Ramsey and offered her an "in person", "face-to-face" appointment versus a telephone encounter. She indicated preferring the telephone encounter, at this time.   Reason for Virtual Visit: COVID-19*  Social distancing based on CDC and AMA recommendations.   I contacted Chelsea Ramsey on 09/13/2019 via telephone.      I clearly identified myself as Gaspar Cola, MD. I verified that I was speaking with the correct person using two identifiers (Name: Chelsea Ramsey, and date of birth: 1966-07-01).  Advanced Informed Consent I sought verbal advanced consent from Chelsea Ramsey for virtual visit interactions. I informed [name] of possible security and privacy concerns, risks, and limitations associated with providing "not-in-person" medical evaluation and management services. I also informed [name] of the availability of "in-person" appointments. Finally, I informed her that there would be a charge for the virtual visit and that she could be  personally, fully or partially, financially responsible for it. [name] expressed understanding and agreed to proceed.   Historic Elements   [name] is a 53 y.o. year old, female patient evaluated today after her last encounter by our practice on 08/30/2019. Chelsea Ramsey  has a past medical history of Acute drug-induced gout of left foot (03/01/2018), Allergy, Anxiety, Arthritis, Asthma, CHF (congestive heart  failure) ([place]), COPD (chronic obstructive pulmonary disease) ([place]), Coronary artery disease, Fibromyalgia, GERD (gastroesophageal reflux disease), Hypertension, Pneumonia, PUD (peptic ulcer disease), Pulmonary HTN ([place]), Rheumatic fever/heart disease, and Sleep apnea. 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 Chelsea Ramsey has a current medication list which includes the following prescription(s): albuterol, allopurinol, butalbital-acetaminophen-caffeine, gnp calcium 1200, d 5000, colchicine, diazepam, docusate sodium, trelegy ellipta, furosemide, gabapentin, guaifenesin, ipratropium-albuterol, lactulose, meclizine, metolazone, metoprolol tartrate, nitroglycerin, nortriptyline, omeprazole, oxycodone hcl, oxycodone hcl, oxycodone hcl, potassium chloride sa, pregabalin, promethazine, and nortriptyline. She  reports that she has been smoking cigarettes. She has been smoking about 0.10 packs per day. She has never used smokeless tobacco. She reports previous alcohol use of about 3.0 standard drinks of alcohol per week. She reports previous drug use. [name] is allergic to amiodarone; aspirin; flexeril [cyclobenzaprine]; trazamine [name] & diet manage prod]; codeine; and tramadol.   HPI  Today, she is being contacted for medication management.  She is still taking the gabapentin and Lyrica.  Neither one of these medications are being prescribed by me.  Recently started on Valium (diazepam) 2 mg p.o. 3 times daily by Jannifer Franklin, NP.  Today I have sent a letter to Jannifer Franklin, NP, regarding the issue of prescribing benzodiazepines concomitantly with opioid analgesics, according to [facility] guidelines.  My personal recommendation is that in the event that the use of benzodiazepines is being considered, I  would personally refer the patient to a psychiatrist for an evaluation to determine if the use of that benzodiazepine is clinically warranted and justified, before prescribing it.  Even then, I would prefer if it is being prescribed by the psychiatrist so that they  can closely monitor the use of this substance in combination with the opioid analgesics.  The patient indicates that since she was started on the colchicine and allopurinol on a regular basis, she has not experienced any more gout attacks as she was having in the past.  She is extremely thankful for this and she has also indicated that she is not experiencing any side effects or adverse reactions to any of these medicines.  Pharmacotherapy Assessment  Analgesic: Oxycodone IR 10 mg, 1 tab PO BID (20 mg/day of oxycodone) MME/day: 30 mg/day.   Monitoring: Pharmacotherapy: No side-effects or adverse reactions reported. [place] PMP: PDMP reviewed during this encounter.       Compliance: No problems identified. Effectiveness: Clinically acceptable. Plan: Refer to "POC".  UDS:  Summary  Date Value Ref Range Status  02/08/2019 FINAL  Final    Comment:    ==================================================================== TOXASSURE SELECT 13 (MW) ==================================================================== Test                             Result       Flag       Units Drug Present not Declared for Prescription Verification   Oxycodone                      760          UNEXPECTED ng/mg creat   Oxymorphone                    1283         UNEXPECTED ng/mg creat   Noroxycodone                   1082         UNEXPECTED ng/mg creat   Noroxymorphone                 378          UNEXPECTED ng/mg creat    Sources of oxycodone are scheduled prescription medications.    Oxymorphone, noroxycodone, and noroxymorphone are expected    metabolites of oxycodone. Oxymorphone is also available as a    scheduled prescription medication. Drug  Absent but Declared for Prescription Verification   Butalbital                     Not Detected UNEXPECTED ==================================================================== Test                      Result    Flag   Units      Ref Range   Creatinine              107              mg/dL      >=20 ==================================================================== Declared Medications:  The flagging and interpretation on this report are based on the  following declared medications.  Unexpected results may arise from  inaccuracies in the declared medications.  **Note: The testing scope of this panel includes these medications:  Butalbital (Fioricet)  **Note: The testing scope of this panel does not include following  reported medications:  Acetaminophen (Fioricet)  Albuterol (Duoneb)  Allopurinol  Bisacodyl  Budesonide (Symbicort)  Caffeine (Fioricet)  Calcium (Calcium/Vitamin D)  Cholecalciferol  Colchicine  Formoterol (Symbicort)  Furosemide  Gabapentin  Guaifenesin  Ipratropium (Duoneb)  Lactulose  Vitamin D (Calcium/Vitamin D) ==================================================================== For clinical consultation, please call [phone]. ====================================================================    Laboratory Chemistry Profile (12 mo)  Renal: 10/10/2018: BUN/Creatinine Ratio 18 08/24/2019: BUN 16; Creatinine, Ser 0.68  Lab Results  Component Value Date   GFRAA >60 08/24/2019   GFRNONAA >60 08/24/2019   Hepatic: 08/24/2019: Albumin 4.0 Lab Results  Component Value Date   AST 21 08/24/2019   ALT 17 08/24/2019   Other: 10/10/2018: CRP 11; Sed Rate 79; Vitamin B-12 615 12/19/2018: 25-Hydroxy, Vitamin D 38; 25-Hydroxy, Vitamin D-2 33; 25-Hydroxy, Vitamin D-3 4.7 Note: Above Lab results reviewed.  Imaging  Last 90 days:  Mr Brain Wo Contrast  Result Date: 07/23/2019 CLINICAL DATA:  53 year old female with headaches, dizziness, memory loss and  visual disturbance since July 2019 following open heart surgery. EXAM: MRI HEAD WITHOUT CONTRAST TECHNIQUE: Multiplanar, multiecho pulse sequences of the brain and surrounding structures were obtained without intravenous contrast. COMPARISON:  Brain MRI 10/11/2018 and earlier. FINDINGS: Brain: No restricted diffusion to suggest acute infarction. No midline shift, mass effect, evidence of mass lesion, ventriculomegaly, extra-axial collection or acute intracranial hemorrhage. Cervicomedullary junction and pituitary are within normal limits. Cerebral volume remains normal for age. [place] Cables and white matter signal throughout the brain is stable and largely normal for age. 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 No definite chronic blood products.  No new signal abnormality. Vascular: Major intracranial vascular flow voids are stable. Skull and upper cervical spine: Negative visible cervical spine. Visualized bone marrow signal is within normal limits. Sinuses/Orbits: Chronic left orbital floor fracture with herniated intraorbital fat. See series 17, image 23. Otherwise negative orbits and paranasal sinuses. Other: Mastoids remain clear. Visible internal auditory structures appear normal. Scalp and face soft tissues appear negative. IMPRESSION: 1.  No acute intracranial abnormality. Stable noncontrast MRI appearance of the brain since 2015, negative for age aside from a small chronic left cerebellar PICA infarct. 2. 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 Note that occasionally such an injury can be associated with dysfunction of the Oculocardiac Reflex. Electronically Signed   By: Genevie Ann M.D.   On: 07/23/2019 16:26    Assessment  The primary encounter diagnosis was Chronic pain syndrome. Diagnoses of Chronic low back pain (Primary Area of Pain) (Bilateral)  (L>R) w/o sciatica, Chronic lower extremity pain (Secondary Area of Pain) (Left), Sternal pain (Tertiary Area of Pain), Fibromyalgia (Fourth Area of Pain), Chronic musculoskeletal pain (Fourth Area of Pain), Chronic knee pain after total replacement (Fifth Area of Pain) (Right), Chronic gout involving toe of foot, unspecified cause (Left), and Vitamin D deficiency were also pertinent to this visit.  Plan of Care  I have discontinued Nevaya C. Monter "[name]"'s Galcanezumab-gnlm. I am also having her start on Oxycodone HCl and Oxycodone HCl. Additionally, I am having her maintain her lactulose, nitroGLYCERIN, guaiFENesin, gabapentin, butalbital-acetaminophen-caffeine, metoprolol tartrate, nortriptyline, metolazone, albuterol, docusate sodium, D 5000, furosemide, nortriptyline, promethazine, meclizine, ipratropium-albuterol, Trelegy Ellipta, potassium chloride SA, omeprazole, pregabalin, diazepam, allopurinol, colchicine, GNP Calcium 1200, and Oxycodone HCl.  Pharmacotherapy (Medications Ordered): Meds ordered this encounter  Medications  . allopurinol (ZYLOPRIM) 100 MG tablet    Sig: Take 1 tablet (100 mg total) by mouth 2 (two) times daily.    Dispense:  60 tablet    Refill:  5    Fill one day early if pharmacy is closed on scheduled refill date. May substitute for generic if available.  . colchicine  0.6 MG tablet    Sig: Take 1 tablet (0.6 mg total) by mouth daily. As directed    Dispense:  30 tablet    Refill:  5    Fill one day early if pharmacy is closed on scheduled refill date. May substitute for generic if available.  . Calcium Carbonate-Vit D-Min (GNP CALCIUM 1200) 1200-1000 MG-UNIT CHEW    Sig: Chew 1,200 mg by mouth daily with breakfast. Take in combination with vitamin D and magnesium.    Dispense:  90 tablet    Refill:  3    Fill one day early if pharmacy is closed on scheduled refill date. May substitute for generic if available.  . Oxycodone HCl 10 MG TABS    Sig: Take 0.5-1  tablets (5-10 mg total) by mouth 2 (two) times daily as needed. Must last 30 days. MAX.: 2/day    Dispense:  60 tablet    Refill:  0    Chronic Pain: STOP Act (Not applicable) Fill 1 day early if closed on refill date. Do not fill until: 09/16/2019. To last until: 10/16/2019. Avoid benzodiazepines within 8 hours of opioids  . Oxycodone HCl 10 MG TABS    Sig: Take 0.5-1 tablets (5-10 mg total) by mouth 2 (two) times daily as needed. Must last 30 days. MAX.: 2/day    Dispense:  60 tablet    Refill:  0    Chronic Pain: STOP Act (Not applicable) Fill 1 day early if closed on refill date. Do not fill until: 10/16/2019. To last until: 11/15/2019. Avoid benzodiazepines within 8 hours of opioids  . Oxycodone HCl 10 MG TABS    Sig: Take 0.5-1 tablets (5-10 mg total) by mouth 2 (two) times daily as needed. Must last 30 days. MAX.: 2/day    Dispense:  60 tablet    Refill:  0    Chronic Pain: STOP Act (Not applicable) Fill 1 day early if closed on refill date. Do not fill until: 11/15/2019. To last until: 12/15/2019. Avoid benzodiazepines within 8 hours of opioids   Orders:  No orders of the defined types were placed in this encounter.  Follow-up plan:   Return in about 13 weeks (around 12/13/2019) for (VV), (MM).      Interventional management options: Considering:   NOTE: XARELTOAnticoagulation.(Stop:x 3 days prior to procedures. Re-start:6 hours after) - (NO STEROIDS- CHF) - NO RFA until BMI < 35 Diagnostic bilateral lumbar facet nerve blocks(100/100/40 w/o steroids) Diagnostic bilateral lumbar facet RFA(NO RFA until BMI <35) Diagnostic bilateral sacroiliac joint block  Possible bilateral sacroiliac joint RFA (NO RFA until BMI <35) Diagnostic right knee genicular nerve block Possible right knee genicular nerve RFA   Palliative PRN treatment(s):   None at this time    Recent Visits No visits were found meeting these conditions.  Showing recent visits within past 90 days and  meeting all other requirements   Today's Visits Date Type Provider Dept  09/13/19 Office Visit Milinda Pointer, MD [facility]  Showing today's visits and meeting all other requirements   Future Appointments No visits were found meeting these conditions.  Showing future appointments within next 90 days and meeting all other requirements   I discussed the assessment and treatment plan with the patient. The patient was provided an opportunity to ask questions and all were answered. The patient agreed with the plan and demonstrated an understanding of the instructions.  Patient advised to call back or seek an in-person evaluation if the symptoms or  condition worsens.  Total duration of non-face-to-face encounter: 12 minutes.  Note by: Gaspar Cola, MD Date: 09/13/2019; Time: 12:08 PM  Note: This dictation was prepared with Dragon dictation. Any transcriptional errors that may result from this process are unintentional.  Disclaimer:  * Given the special circumstances of the COVID-19 pandemic, the federal government has announced that the Office for Civil Rights (OCR) will exercise its enforcement discretion and will not impose penalties on physicians using telehealth in the event of noncompliance with regulatory requirements under the [place] (HIPAA) in connection with the good faith provision of telehealth during the XX123456 national public health emergency. ([place])

## 2019-09-25 ENCOUNTER — Other Ambulatory Visit: Payer: Self-pay | Admitting: Physician Assistant

## 2019-09-25 ENCOUNTER — Encounter: Payer: Self-pay | Admitting: Physician Assistant

## 2019-09-25 DIAGNOSIS — R42 Dizziness and giddiness: Secondary | ICD-10-CM

## 2019-09-25 NOTE — Telephone Encounter (Signed)
L.O.V. was on 08/24/2019 and upcoming appointment on 09/27/2019.

## 2019-09-26 MED ORDER — MECLIZINE HCL 12.5 MG PO TABS: 25.0000 mg | ORAL_TABLET | Freq: Three times a day (TID) | ORAL | 0 refills | Status: DC | PRN

## 2019-09-26 MED ORDER — PROMETHAZINE HCL 12.5 MG PO TABS: 12.5000 mg | ORAL_TABLET | Freq: Four times a day (QID) | ORAL | 0 refills | Status: DC | PRN

## 2019-09-26 NOTE — Telephone Encounter (Signed)
Pt called saying she fell this morning but she is ok.  She needs the medication for the dizziness. She said she is having a hard time getting her refills.  Please advise  CB#  [phone]  teri

## 2019-09-27 ENCOUNTER — Other Ambulatory Visit (HOSPITAL_COMMUNITY)
Admission: RE | Admit: 2019-09-27 | Discharge: 2019-09-27 | Disposition: A | Discharge status: Home / Self Care | Payer: Medicaid Other | Source: Ambulatory Visit | Attending: Physician Assistant | Admitting: Physician Assistant

## 2019-09-27 ENCOUNTER — Other Ambulatory Visit: Payer: Self-pay

## 2019-09-27 ENCOUNTER — Ambulatory Visit: Payer: Medicaid Other | Admitting: Physician Assistant

## 2019-09-27 ENCOUNTER — Encounter: Payer: Self-pay | Admitting: Physician Assistant

## 2019-09-27 VITALS — BP 114/68 | HR 90 | Temp 96.6°F | Wt 292.4 lb

## 2019-09-27 DIAGNOSIS — Z1231 Encounter for screening mammogram for malignant neoplasm of breast: Secondary | ICD-10-CM

## 2019-09-27 DIAGNOSIS — Z0000 Encounter for general adult medical examination without abnormal findings: Secondary | ICD-10-CM

## 2019-09-27 DIAGNOSIS — Z23 Encounter for immunization: Secondary | ICD-10-CM

## 2019-09-27 DIAGNOSIS — Z124 Encounter for screening for malignant neoplasm of cervix: Secondary | ICD-10-CM | POA: Insufficient documentation

## 2019-09-27 DIAGNOSIS — R7303 Prediabetes: Secondary | ICD-10-CM | POA: Insufficient documentation

## 2019-09-27 NOTE — Patient Instructions (Signed)
Health Maintenance, Female Adopting a healthy lifestyle and getting preventive care are important in promoting health and wellness. Ask your health care provider about:  The right schedule for you to have regular tests and exams.  Things you can do on your own to prevent diseases and keep yourself healthy. What should I know about diet, weight, and exercise? Eat a healthy diet   Eat a diet that includes plenty of vegetables, fruits, low-fat dairy products, and lean protein.  Do not eat a lot of foods that are high in solid fats, added sugars, or sodium. Maintain a healthy weight Body mass index (BMI) is used to identify weight problems. It estimates body fat based on height and weight. Your health care provider can help determine your BMI and help you achieve or maintain a healthy weight. Get regular exercise Get regular exercise. This is one of the most important things you can do for your health. Most adults should:  Exercise for at least 150 minutes each week. The exercise should increase your heart rate and make you sweat (moderate-intensity exercise).  Do strengthening exercises at least twice a week. This is in addition to the moderate-intensity exercise.  Spend less time sitting. Even light physical activity can be beneficial. Watch cholesterol and blood lipids Have your blood tested for lipids and cholesterol at 52 years of age, then have this test every 5 years. Have your cholesterol levels checked more often if:  Your lipid or cholesterol levels are high.  You are older than 52 years of age.  You are at high risk for heart disease. What should I know about cancer screening? Depending on your health history and family history, you may need to have cancer screening at various ages. This may include screening for:  Breast cancer.  Cervical cancer.  Colorectal cancer.  Skin cancer.  Lung cancer. What should I know about heart disease, diabetes, and high blood  pressure? Blood pressure and heart disease  High blood pressure causes heart disease and increases the risk of stroke. This is more likely to develop in people who have high blood pressure readings, are of African descent, or are overweight.  Have your blood pressure checked: ? Every 3-5 years if you are 18-39 years of age. ? Every year if you are 40 years old or older. Diabetes Have regular diabetes screenings. This checks your fasting blood sugar level. Have the screening done:  Once every three years after age 40 if you are at a normal weight and have a low risk for diabetes.  More often and at a younger age if you are overweight or have a high risk for diabetes. What should I know about preventing infection? Hepatitis B If you have a higher risk for hepatitis B, you should be screened for this virus. Talk with your health care provider to find out if you are at risk for hepatitis B infection. Hepatitis C Testing is recommended for:  Everyone born from 1945 through 1965.  Anyone with known risk factors for hepatitis C. Sexually transmitted infections (STIs)  Get screened for STIs, including gonorrhea and chlamydia, if: ? You are sexually active and are younger than 52 years of age. ? You are older than 52 years of age and your health care provider tells you that you are at risk for this type of infection. ? Your sexual activity has changed since you were last screened, and you are at increased risk for chlamydia or gonorrhea. Ask your health care provider if   you are at risk.  Ask your health care provider about whether you are at high risk for HIV. Your health care provider may recommend a prescription medicine to help prevent HIV infection. If you choose to take medicine to prevent HIV, you should first get tested for HIV. You should then be tested every 3 months for as long as you are taking the medicine. Pregnancy  If you are about to stop having your period (premenopausal) and  you may become pregnant, seek counseling before you get pregnant.  Take 400 to 800 micrograms (mcg) of folic acid every day if you become pregnant.  Ask for birth control (contraception) if you want to prevent pregnancy. Osteoporosis and menopause Osteoporosis is a disease in which the bones lose minerals and strength with aging. This can result in bone fractures. If you are 65 years old or older, or if you are at risk for osteoporosis and fractures, ask your health care provider if you should:  Be screened for bone loss.  Take a calcium or vitamin D supplement to lower your risk of fractures.  Be given hormone replacement therapy (HRT) to treat symptoms of menopause. Follow these instructions at home: Lifestyle  Do not use any products that contain nicotine or tobacco, such as cigarettes, e-cigarettes, and chewing tobacco. If you need help quitting, ask your health care provider.  Do not use street drugs.  Do not share needles.  Ask your health care provider for help if you need support or information about quitting drugs. Alcohol use  Do not drink alcohol if: ? Your health care provider tells you not to drink. ? You are pregnant, may be pregnant, or are planning to become pregnant.  If you drink alcohol: ? Limit how much you use to 0-1 drink a day. ? Limit intake if you are breastfeeding.  Be aware of how much alcohol is in your drink. In the [place] one drink equals one 12 oz bottle of beer (355 mL), one 5 oz glass of wine (148 mL), or one 1 oz glass of hard liquor (44 mL). General instructions  Schedule regular health, dental, and eye exams.  Stay current with your vaccines.  Tell your health care provider if: ? You often feel depressed. ? You have ever been abused or do not feel safe at home. Summary  Adopting a healthy lifestyle and getting preventive care are important in promoting health and wellness.  Follow your health care provider's instructions about healthy  diet, exercising, and getting tested or screened for diseases.  Follow your health care provider's instructions on monitoring your cholesterol and blood pressure. This information is not intended to replace advice given to you by your health care provider. Make sure you discuss any questions you have with your health care provider. Document Released: 06/01/2011 Document Revised: 11/09/2018 Document Reviewed: 11/09/2018 Elsevier Patient Education  2020 [employer]  

## 2019-09-27 NOTE — Progress Notes (Signed)
Patient: Chelsea Ramsey, Female    DOB: Oct 10, 1966, 53 y.o.   MRN: [id number] Visit Date: 09/27/2019  Today's Provider: Trinna Post, PA-C   Chief Complaint  Patient presents with  . Annual Exam   Subjective:    Annual physical exam Chelsea Ramsey is a 53 y.o. female who presents today for health maintenance and complete physical. She feels fairly well. She reports exercising none. She reports she is sleeping well.  Mammo: 01/26/2019 normal  PAP: due today Influenza: due today   In the interim, she has seen [facility] pulmonology.   She has an appointment scheduled on 10/01/2019 with [facility] for vertigo.   Prediabetes  Lab Results  Component Value Date   HGBA1C 6.2 (H) 08/24/2019    -----------------------------------------------------------------   Review of Systems  Constitutional: Negative.   HENT: Negative.   Eyes: Negative.   Respiratory: Negative.   Cardiovascular: Negative.   Gastrointestinal: Negative.   Endocrine: Negative.   Genitourinary: Negative.   Musculoskeletal: Negative.   Skin: Negative.   Allergic/Immunologic: Negative.   Neurological: Negative.   Hematological: Negative.   Psychiatric/Behavioral: Negative.     Social History She  reports that she has been smoking cigarettes. She has been smoking about 0.10 packs per day. She has never used smokeless tobacco. She reports previous alcohol use of about 3.0 standard drinks of alcohol per week. She reports previous drug use. Social History   Socioeconomic History  . Marital status: Married    Spouse name: Not on file  . Number of children: Not on file  . Years of education: Not on file  . Highest education level: Not on file  Occupational History  . Not on file  Social Needs  . Financial resource strain: Not on file  . Food insecurity    Worry: Not on file    Inability: Not on file  . Transportation needs    Medical: Not on file   Non-medical: Not on file  Tobacco Use  . Smoking status: Current Every Day Smoker    Packs/day: 0.10    Types: Cigarettes  . Smokeless tobacco: Never Used  . Tobacco comment: 4/20 was not able to quit.  Still at 1-3 a day. She would like to wait to set a new quit date until we get back to class to have the extra in person support  Substance and Sexual Activity  . Alcohol use: Not Currently    Alcohol/week: 3.0 standard drinks    Types: 3 Cans of beer per week    Comment: 16 oz per week  . 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 Sexual activity: Not on file  Lifestyle  . Physical activity    Days per week: Not on file    Minutes per session: Not on file  . Stress: Not on file  Relationships  . Social Herbalist on phone: Not on file    Gets together: Not on file    Attends religious service: Not on file    Active member of club or organization: Not on file    Attends meetings of clubs or organizations: Not on file    Relationship status: Not on file  Other Topics Concern  . Not on file  Social History Narrative  . Not on file    Patient Active Problem List   Diagnosis Date Noted  .  Numbness 08/03/2019  . Headache disorder 08/03/2019  . Elevated uric acid in blood 02/08/2019  . Hyperglycemia 02/08/2019  . Secondary osteoarthritis of multiple sites 02/08/2019  . Osteoarthritis of knees (Bilateral) (R>L) 02/08/2019  . Chronic gout of multiple sites, unspecified cause 02/08/2019  . Chronic musculoskeletal pain (Fourth Area of Pain) 02/08/2019  . Epidural lipomatosis 02/08/2019  . 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/08/2019  . History of cocaine use 01/18/2019  . Chronic gout involving toe of foot, unspecified cause (Left) 01/18/2019  . Chronic hip pain (Left) 01/18/2019  . Osteoarthritis of sacroiliac joints (Bilateral) 01/18/2019  . Other specified dorsopathies, sacral and sacrococcygeal region 01/18/2019  .  Lumbar facet arthropathy (Bilateral) 01/05/2019  . Melena 01/03/2019  . Acute esophagogastric ulcer 01/03/2019  . Spondylosis without myelopathy or radiculopathy, lumbar region 12/27/2018  . Lumbar facet syndrome (Bilateral) 12/27/2018  . DDD (degenerative disc disease), lumbar 12/27/2018  . Chronic low back pain (Primary Area of Pain) (Bilateral) (L>R) w/o sciatica 12/27/2018  . Abnormal MRI, lumbar spine (01/05/2018) 12/27/2018  . Elevated sed rate 12/19/2018  . Elevated C-reactive protein 12/19/2018  . Diplopia 12/14/2018  . Dizziness 12/14/2018  . Headache, chronic daily 12/14/2018  . Numbness and tingling of both feet 12/14/2018  . CHF (congestive heart failure) ([place]) 12/04/2018  . Class 3 severe obesity with body mass index (BMI) of 45.0 to 49.9 in adult ([place]) 11/14/2018  . Chronic anticoagulation (XARELTO) 11/14/2018  . Vitamin D deficiency 11/14/2018  . Chronic knee pain after total replacement (Fifth Area of Pain) (Right) 11/14/2018  . Chest pain 10/17/2018  . Chronic low back pain (Primary Area of Pain) (Bilateral) w/ sciatica (Left) 10/10/2018  . Chronic lower extremity pain (Secondary Area of Pain) (Left) 10/10/2018  . Sternal pain (Tertiary Area of Pain) 10/10/2018  . Fibromyalgia (Fourth Area of Pain) 10/10/2018  . Chronic knee pain (Right) 10/10/2018  . Chronic sacroiliac joint pain (Bilateral) (L>R) 10/10/2018  . Chronic pain syndrome 10/10/2018  . Long term current use of opiate analgesic 10/10/2018  . Pharmacologic therapy 10/10/2018  . Disorder of skeletal system 10/10/2018  . Problems influencing health status 10/10/2018  . HTN (hypertension) 07/26/2018  . Acute CHF (congestive heart failure) ([place]) 07/17/2018  . S/P MVR (mitral valve replacement) 06/20/2018  . Shortness of breath 05/27/2018  . GIB (gastrointestinal bleeding) 05/22/2018  . A-fib ([place]) 04/22/2018  . Insomnia 12/25/2016  . Osteoarthritis of knee (Right) 09/04/2016  . Tobacco dependence 05/08/2015   . GAD (generalized anxiety disorder) 03/13/2015  . Moderate to severe pulmonary hypertension ([place]) 03/13/2015  . Rheumatic heart disease, unspecified 03/13/2015  . Chronic diastolic heart failure ([place]) 02/25/2015  . Depression 02/25/2015  . History of substance abuse ([place]) 02/25/2015    Past Surgical History:  Procedure Laterality Date  . ADENOIDECTOMY    . ESOPHAGOGASTRODUODENOSCOPY (EGD) WITH PROPOFOL N/A 05/25/2018   Procedure: ESOPHAGOGASTRODUODENOSCOPY (EGD) WITH PROPOFOL;  Surgeon: Lucilla Lame, MD;  Location: [facility];  Service: Endoscopy;  Laterality: N/A;  . MITRAL VALVE REPLACEMENT    . MULTIPLE TOOTH EXTRACTIONS    . TONSILLECTOMY    . TOTAL KNEE ARTHROPLASTY Right 09/04/2016   Procedure: TOTAL KNEE ARTHROPLASTY; with lateral release;  Surgeon: Earlie Server, MD;  Location: [place];  Service: Orthopedics;  Laterality: Right;    Family History  Family Status  Relation Name Status  . Mother  Alive  . Father  Deceased  . Mat Aunt  Deceased   Her family history includes Asthma in her mother; Breast cancer in  her maternal aunt; COPD in her mother; Cancer in her mother; Congestive Heart Failure in her father; Rheum arthritis in her mother.     Allergies  Allergen Reactions  . Amiodarone Nausea And Vomiting  . Aspirin Swelling  . Flexeril [Cyclobenzaprine] Swelling  . Trazamine [Trazodone & Diet Manage Prod] Nausea And Vomiting  . Codeine Rash  . Tramadol Rash    Previous Medications   ALBUTEROL (PROAIR HFA) 108 (90 BASE) MCG/ACT INHALER    Inhale 1-2 puffs into the lungs every 6 (six) hours as needed for wheezing or shortness of breath.   ALLOPURINOL (ZYLOPRIM) 100 MG TABLET    Take 1 tablet (100 mg total) by mouth 2 (two) times daily.   BUTALBITAL-ACETAMINOPHEN-CAFFEINE (FIORICET, ESGIC) 50-325-40 MG TABLET    Take 1 tablet by mouth every 6 (six) hours as needed.    CALCIUM CARBONATE-VIT D-MIN (GNP CALCIUM 1200) 1200-1000 MG-UNIT CHEW    Chew 1,200 mg by mouth  daily with breakfast. Take in combination with vitamin D and magnesium.   CHOLECALCIFEROL (D 5000) 125 MCG (5000 UT) CAPSULE    Take by mouth.   COLCHICINE 0.6 MG TABLET    Take 1 tablet (0.6 mg total) by mouth daily. As directed   DIAZEPAM (VALIUM) 2 MG TABLET    Take 2 mg by mouth every 8 (eight) hours as needed for anxiety.   DOCUSATE SODIUM (COLACE) 100 MG CAPSULE    Take 200 mg by mouth daily.   FLUTICASONE-UMECLIDIN-VILANT (TRELEGY ELLIPTA) 100-62.5-25 MCG/INH AEPB    Inhale 1 puff into the lungs every morning.   FUROSEMIDE (LASIX) 40 MG TABLET    Take 2 tablets twice daily   GABAPENTIN (NEURONTIN) 600 MG TABLET    Take 0.5 tablets (300 mg total) by mouth 2 (two) times daily.   GUAIFENESIN (MUCINEX) 600 MG 12 HR TABLET    Take 1 tablet (600 mg total) by mouth 2 (two) times daily.   IPRATROPIUM-ALBUTEROL (DUONEB) 0.5-2.5 (3) MG/3ML SOLN    Inhale 3 mLs into the lungs 3 (three) times daily as needed (respiratory).   LACTULOSE (CHRONULAC) 10 GM/15ML SOLUTION    Take 45 mLs (30 g total) by mouth 2 (two) times daily as needed for mild constipation.   MECLIZINE (ANTIVERT) 12.5 MG TABLET    Take 2 tablets (25 mg total) by mouth 3 (three) times daily as needed for dizziness.   METOLAZONE (ZAROXOLYN) 2.5 MG TABLET    Take 2.5 mg by mouth. Twice weekly   METOPROLOL TARTRATE (LOPRESSOR) 100 MG TABLET    Take 1 tablet (100 mg total) by mouth 2 (two) times daily.   NITROGLYCERIN (NITROSTAT) 0.4 MG SL TABLET    Place 1 tablet (0.4 mg total) under the tongue every 5 (five) minutes as needed for chest pain.   NORTRIPTYLINE (PAMELOR) 10 MG CAPSULE    Take 20 mg by mouth at bedtime.    OMEPRAZOLE (PRILOSEC) 20 MG CAPSULE    Take 2 capsules (40 mg total) by mouth 2 (two) times daily before a meal.   OXYCODONE HCL 10 MG TABS    Take 0.5-1 tablets (5-10 mg total) by mouth 2 (two) times daily as needed. Must last 30 days. MAX.: 2/day   OXYCODONE HCL 10 MG TABS    Take 0.5-1 tablets (5-10 mg total) by mouth 2 (two)  times daily as needed. Must last 30 days. MAX.: 2/day   OXYCODONE HCL 10 MG TABS    Take 0.5-1 tablets (5-10 mg total) by mouth  2 (two) times daily as needed. Must last 30 days. MAX.: 2/day   POTASSIUM CHLORIDE SA (K-DUR) 20 MEQ TABLET    Take 2 tablets (40 mEq total) by mouth daily. And additional tablet when taking metolazone   PREGABALIN (LYRICA) 25 MG CAPSULE    25 mg 2 (two) times daily.    PROMETHAZINE (PHENERGAN) 12.5 MG TABLET    Take 1 tablet (12.5 mg total) by mouth every 6 (six) hours as needed for nausea or vomiting.    Patient Care Team: Paulene Floor as PCP - General (Physician Assistant)      Objective:   Vitals: BP 114/68 (BP Location: Left Arm, Patient Position: Sitting, Cuff Size: Large)   Pulse 90   Temp (!) 96.6 F (35.9 C) (Temporal)   Wt 292 lb 6.4 oz (132.6 kg)   LMP 11/08/2009 Comment: menopause  BMI 50.19 kg/m    Physical Exam Exam conducted with a chaperone present.  Constitutional:      Appearance: Normal appearance. She is obese.  Cardiovascular:     Rate and Rhythm: Normal rate and regular rhythm.     Heart sounds: Normal heart sounds.  Pulmonary:     Effort: Pulmonary effort is normal.     Breath sounds: Normal breath sounds.  Genitourinary:    Exam position: Lithotomy position.     Vagina: Normal.     Cervix: Normal.     Comments: Exam difficult to body habitus and patient discomfort.  Skin:    General: Skin is warm and dry.  Neurological:     Mental Status: She is alert and oriented to person, place, and time. Mental status is at baseline.  Psychiatric:        Mood and Affect: Mood normal.        Behavior: Behavior normal.      Depression Screen PHQ 2/9 Scores 09/27/2019 08/24/2019 02/08/2019 01/16/2019  PHQ - 2 Score 0 1 0 1  PHQ- 9 Score 2 5 2 6       Assessment & Plan:     Routine Health Maintenance and Physical Exam  Exercise Activities and Dietary recommendations Goals   None     Immunization History    Administered Date(s) Administered  . Influenza,inj,Quad PF,6+ Mos 11/05/2018  . Influenza-Unspecified 08/30/2017    Health Maintenance  Topic Date Due  . TETANUS/TDAP  05/27/1986  . PAP SMEAR-Modifier  05/27/1988  . INFLUENZA VACCINE  07/01/2019  . MAMMOGRAM  01/26/2021  . COLONOSCOPY  05/31/2028  . HIV Screening  Completed     Discussed health benefits of physical activity, and encouraged her to engage in regular exercise appropriate for her age and condition.    1. Annual physical exam   2. Cervical cancer screening  - Cytology - PAP  3. Need for influenza vaccination  - Flu Vaccine QUAD 36+ mos IM  4. Encounter for screening mammogram for malignant neoplasm of breast  - MM Digital Screening; Future  5. Prediabetes  The entirety of the information documented in the History of Present Illness, Review of Systems and Physical Exam were personally obtained by me. Portions of this information were initially documented by [facility], CMA and reviewed by me for thoroughness and accuracy.   F/u 6 months prediabetes     --------------------------------------------------------------------

## 2019-09-29 ENCOUNTER — Ambulatory Visit: Payer: Medicaid Other | Admitting: Family

## 2019-10-01 IMAGING — DX DG CHEST 1V PORT
1 series · 1 of 1 positions shown · non-contrast
Comparison: CT scan and radiograph November 04, 2018.

CLINICAL DATA: Chest pain.

EXAM:
PORTABLE CHEST 1 VIEW

[chest ap]
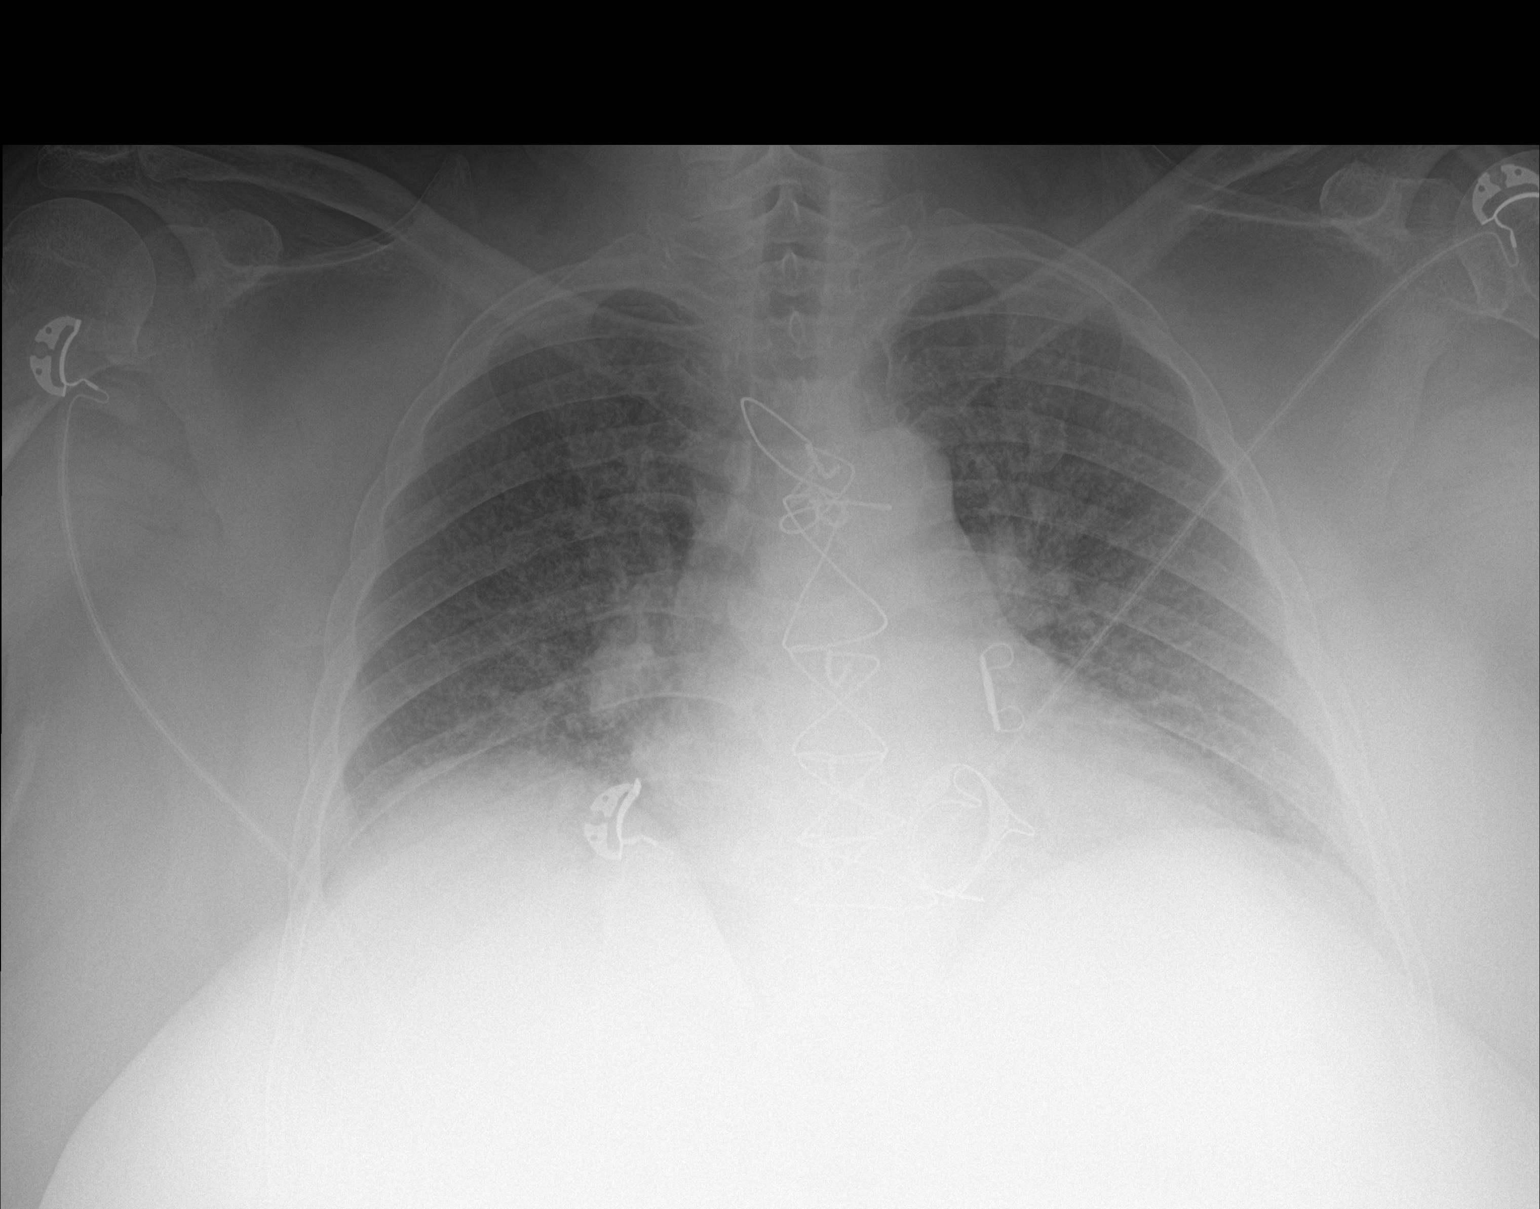

[1 of 1 positions shown; findings below may reference images not displayed]

FINDINGS: Stable cardiomegaly with central pulmonary vascular congestion.
Minimal bilateral pulmonary edema may be present. Hypoinflation of
the lungs is noted. Status post cardiac valve repair. No
pneumothorax or pleural effusion is noted. Bony thorax is
unremarkable.
IMPRESSION: Stable cardiomegaly with central pulmonary vascular congestion and
possible bilateral pulmonary edema. Hypoinflation of the lungs is
noted.

## 2019-10-02 ENCOUNTER — Other Ambulatory Visit: Payer: Self-pay

## 2019-10-02 ENCOUNTER — Encounter: Payer: Self-pay | Admitting: Family

## 2019-10-02 ENCOUNTER — Ambulatory Visit: Payer: Medicaid Other | Attending: Family | Admitting: Family

## 2019-10-02 VITALS — BP 149/83 | HR 85 | Resp 20 | Ht 64.0 in | Wt 292.6 lb

## 2019-10-02 DIAGNOSIS — J449 Chronic obstructive pulmonary disease, unspecified: Secondary | ICD-10-CM | POA: Insufficient documentation

## 2019-10-02 DIAGNOSIS — F17210 Nicotine dependence, cigarettes, uncomplicated: Secondary | ICD-10-CM | POA: Insufficient documentation

## 2019-10-02 DIAGNOSIS — I2510 Atherosclerotic heart disease of native coronary artery without angina pectoris: Secondary | ICD-10-CM | POA: Diagnosis not present

## 2019-10-02 DIAGNOSIS — Z885 Allergy status to narcotic agent status: Secondary | ICD-10-CM | POA: Diagnosis not present

## 2019-10-02 DIAGNOSIS — Z952 Presence of prosthetic heart valve: Secondary | ICD-10-CM | POA: Insufficient documentation

## 2019-10-02 DIAGNOSIS — I4891 Unspecified atrial fibrillation: Secondary | ICD-10-CM | POA: Insufficient documentation

## 2019-10-02 DIAGNOSIS — I110 Hypertensive heart disease with heart failure: Secondary | ICD-10-CM | POA: Insufficient documentation

## 2019-10-02 DIAGNOSIS — M797 Fibromyalgia: Secondary | ICD-10-CM | POA: Diagnosis not present

## 2019-10-02 DIAGNOSIS — I5032 Chronic diastolic (congestive) heart failure: Secondary | ICD-10-CM | POA: Insufficient documentation

## 2019-10-02 DIAGNOSIS — Z8711 Personal history of peptic ulcer disease: Secondary | ICD-10-CM | POA: Insufficient documentation

## 2019-10-02 DIAGNOSIS — Z888 Allergy status to other drugs, medicaments and biological substances status: Secondary | ICD-10-CM | POA: Diagnosis not present

## 2019-10-02 DIAGNOSIS — K219 Gastro-esophageal reflux disease without esophagitis: Secondary | ICD-10-CM | POA: Insufficient documentation

## 2019-10-02 DIAGNOSIS — Z8249 Family history of ischemic heart disease and other diseases of the circulatory system: Secondary | ICD-10-CM | POA: Insufficient documentation

## 2019-10-02 DIAGNOSIS — G4733 Obstructive sleep apnea (adult) (pediatric): Secondary | ICD-10-CM | POA: Insufficient documentation

## 2019-10-02 DIAGNOSIS — Z886 Allergy status to analgesic agent status: Secondary | ICD-10-CM | POA: Insufficient documentation

## 2019-10-02 DIAGNOSIS — I480 Paroxysmal atrial fibrillation: Secondary | ICD-10-CM

## 2019-10-02 DIAGNOSIS — Z79899 Other long term (current) drug therapy: Secondary | ICD-10-CM | POA: Diagnosis not present

## 2019-10-02 DIAGNOSIS — I10 Essential (primary) hypertension: Secondary | ICD-10-CM

## 2019-10-02 LAB — BASIC METABOLIC PANEL
Anion gap: 12 (ref 5–15)
BUN: 19 mg/dL (ref 6–20)
CO2: 32 mmol/L (ref 22–32)
Calcium: 9 mg/dL (ref 8.9–10.3)
Chloride: 94 mmol/L — ABNORMAL LOW (ref 98–111)
Creatinine, Ser: 0.67 mg/dL (ref 0.44–1.00)
GFR calc Af Amer: >60 mL/min (ref >60)
GFR calc non Af Amer: >60 mL/min (ref >60)
Glucose, Bld: 102 mg/dL — ABNORMAL HIGH (ref 70–99)
Potassium: 3.1 mmol/L — ABNORMAL LOW (ref 3.5–5.1)
Sodium: 138 mmol/L (ref 135–145)

## 2019-10-02 NOTE — Progress Notes (Signed)
Patient ID: Chelsea Ramsey, female    DOB: [birth year], 53 y.o.   MRN: [id number]   Chelsea Ramsey is a 53 y/o female with a history of CAD (leaky valve), HTN, pulmonary HTN, COPD, GERD, obstructive sleep apnea, previous tobacco use and chronic heart failure.   Echo reviewed from 09/06/19 and shows an EF of 50% along with a severely enlarged left atria. Echo report from 11/05/18 reviewed and showed an EF of 50-55% along with mild/moderate AR, mild MR,moderate TR and prosthetic mitral valve. Echo report from 08/11/18 reviewed and showed an EF of 40-45% which has declined from previous echo. + moderate TR and mild/moderately elevated PA pressure.  Echo report from 07/20/18 reviewed and showed an EF of 50-55% along with mild MR and an elevated PA pressure of 50 mm Hg.   No admission or ED visits in the last 6 months.   She presents today for a follow-up visit with a chief complaint of shortness of breath and fatigue on moderate exertion. This is associated with abdominal distention that comes and goes, vertigo, and trouble sleeping. She has started using 4 pillows at night along with 4L of oxygen at night. She states that she has been taking her metolazone 3 times a week and she feels like she is not urinating as much with the lasix. She has taken torsemide and bumex in the past. She weighs herself every day and has noted that her weight has slowly gone up. She denies adding salt to foods and reads the nutrition label for sodium content. She drinks approximately 72 ounces of fluid per day. She is concerned that her weight and "abdomina fluid" keeps fluctuating.   Past Medical History:  Diagnosis Date  . Acute drug-induced gout of left foot 03/01/2018   Last Assessment & Plan:  Likely at least partially brought on by diuresis.  Needs to continue to diurese  Will push hydration Stop allopurinol given initiation during acute flare may worsen this, re-broach this when asymptomatic Avoid nsaids given stomach  pain Trial colchicine Add acetaminophen Ice, elevate, rest  . Allergy    seasonal  . Anxiety   . Arthritis    Right Knee  . Asthma   . CHF (congestive heart failure) ([place])   . COPD (chronic obstructive pulmonary disease) ([place])   . Coronary artery disease    Leaky heart valve  . Fibromyalgia   . GERD (gastroesophageal reflux disease)   . Hypertension   . Pneumonia   . PUD (peptic ulcer disease)   . Pulmonary HTN ([place])   . Rheumatic fever/heart disease   . Sleep apnea    Past Surgical History:  Procedure Laterality Date  . ADENOIDECTOMY    . ESOPHAGOGASTRODUODENOSCOPY (EGD) WITH PROPOFOL N/A 05/25/2018   Procedure: ESOPHAGOGASTRODUODENOSCOPY (EGD) WITH PROPOFOL;  Surgeon: Lucilla Lame, MD;  Location: [facility] ENDOSCOPY;  Service: Endoscopy;  Laterality: N/A;  . MITRAL VALVE REPLACEMENT    . MULTIPLE TOOTH EXTRACTIONS    . TONSILLECTOMY    . TOTAL KNEE ARTHROPLASTY Right 09/04/2016   Procedure: TOTAL KNEE ARTHROPLASTY; with lateral release;  Surgeon: Earlie Server, MD;  Location: [name];  Service: Orthopedics;  Laterality: Right;   Family History  Problem Relation Age of Onset  . COPD Mother   . Cancer Mother        Bone  . Asthma Mother   . Rheum arthritis Mother   . Congestive Heart Failure Father   . Breast cancer Maternal Aunt    Social History  Tobacco Use  . Smoking status: Current Every Day Smoker    Packs/day: 0.10    Types: Cigarettes  . Smokeless tobacco: Never Used  . Tobacco comment: 4/20 was not able to quit.  Still at 1-3 a day. She would like to wait to set a new quit date until we get back to class to have the extra in person support  Substance Use Topics  . Alcohol use: Not Currently    Alcohol/week: 3.0 standard drinks    Types: 3 Cans of beer per week    Comment: 16 oz per week   Allergies  Allergen Reactions  . Amiodarone Nausea And Vomiting  . Aspirin Swelling  . Flexeril [Cyclobenzaprine] Swelling  . Trazamine [Trazodone & Diet Manage Prod]  Nausea And Vomiting  . Codeine Rash  . Tramadol Rash   Prior to Admission medications   Medication Sig Start Date End Date Taking? Authorizing Provider  albuterol (PROAIR HFA) 108 (90 Base) MCG/ACT inhaler Inhale 1-2 puffs into the lungs every 6 (six) hours as needed for wheezing or shortness of breath.   Yes [provider]  allopurinol (ZYLOPRIM) 100 MG tablet Take 1 tablet (100 mg total) by mouth 2 (two) times daily. 09/13/19 03/11/20 Yes Milinda Pointer, MD  butalbital-acetaminophen-caffeine (FIORICET, ESGIC) [phone] MG tablet Take 1 tablet by mouth every 6 (six) hours as needed.    Yes [provider]  Calcium Carbonate-Vit D-Min (GNP CALCIUM 1200) 1200-1000 MG-UNIT CHEW Chew 1,200 mg by mouth daily with breakfast. Take in combination with vitamin D and magnesium. 09/13/19 09/12/20 Yes Milinda Pointer, MD  Cholecalciferol (D 5000) 125 MCG (5000 UT) capsule Take by mouth. 11/14/18  Yes [provider]  colchicine 0.6 MG tablet Take 1 tablet (0.6 mg total) by mouth daily. As directed 09/13/19 03/11/20 Yes Milinda Pointer, MD  diazepam (VALIUM) 2 MG tablet Take 2 mg by mouth every 8 (eight) hours as needed for anxiety.   Yes [provider]  docusate sodium (COLACE) 100 MG capsule Take 200 mg by mouth daily.   Yes [provider]  Fluticasone-Umeclidin-Vilant (TRELEGY ELLIPTA) 100-62.5-25 MCG/INH AEPB Inhale 1 puff into the lungs every morning. 08/24/19 11/22/19 Yes Trinna Post, PA-C  furosemide (LASIX) 40 MG tablet Take 2 tablets twice daily Patient taking differently: Take 2 tablets in the am and 1 tablet at night. 08/23/19  Yes Hackney, Tina A, FNP  gabapentin (NEURONTIN) 600 MG tablet Take 0.5 tablets (300 mg total) by mouth 2 (two) times daily. Patient taking differently: Take 600 mg by mouth 3 (three) times daily.  12/06/18  Yes Vaughan Basta, MD  guaiFENesin (MUCINEX) 600 MG 12 hr tablet Take 1 tablet (600 mg total) by mouth  2 (two) times daily. 11/19/18  Yes Salary, Montell D, MD  ipratropium-albuterol (DUONEB) 0.5-2.5 (3) MG/3ML SOLN Inhale 3 mLs into the lungs 3 (three) times daily as needed (respiratory). 08/24/19 01/13/23 Yes Trinna Post, PA-C  lactulose (CHRONULAC) 10 GM/15ML solution Take 45 mLs (30 g total) by mouth 2 (two) times daily as needed for mild constipation. 07/21/18  Yes Epifanio Lesches, MD  meclizine (ANTIVERT) 12.5 MG tablet Take 2 tablets (25 mg total) by mouth 3 (three) times daily as needed for dizziness. 09/26/19  Yes Carles Collet M, PA-C  metolazone (ZAROXOLYN) 2.5 MG tablet Take 2.5 mg by mouth. Twice weekly   Yes [provider]  metoprolol tartrate (LOPRESSOR) 100 MG tablet Take 1 tablet (100 mg total) by mouth 2 (two) times daily. 12/21/18  Yes Darylene Price A, FNP  nitroGLYCERIN (NITROSTAT) 0.4 MG SL tablet Place 1 tablet (0.4 mg total) under the tongue every 5 (five) minutes as needed for chest pain. 11/05/18  Yes Mody, Ulice Bold, MD  nortriptyline (PAMELOR) 10 MG capsule Take 20 mg by mouth at bedtime.    Yes [provider]  omeprazole (PRILOSEC) 20 MG capsule Take 2 capsules (40 mg total) by mouth 2 (two) times daily before a meal. 09/05/19 10/05/19 Yes Pollak, Adriana M, PA-C  Oxycodone HCl 10 MG TABS Take 0.5-1 tablets (5-10 mg total) by mouth 2 (two) times daily as needed. Must last 30 days. MAX.: 2/day 09/16/19 10/16/19 Yes Milinda Pointer, MD  Oxycodone HCl 10 MG TABS Take 0.5-1 tablets (5-10 mg total) by mouth 2 (two) times daily as needed. Must last 30 days. MAX.: 2/day 10/16/19 11/15/19 Yes Milinda Pointer, MD  Oxycodone HCl 10 MG TABS Take 0.5-1 tablets (5-10 mg total) by mouth 2 (two) times daily as needed. Must last 30 days. MAX.: 2/day 11/15/19 12/15/19 Yes Milinda Pointer, MD  potassium chloride SA (K-DUR) 20 MEQ tablet Take 2 tablets (40 mEq total) by mouth daily. And additional tablet when taking metolazone 08/28/19  Yes Hackney, Tina A, FNP   pregabalin (LYRICA) 25 MG capsule 25 mg 2 (two) times daily.  08/03/19  Yes [provider]  promethazine (PHENERGAN) 12.5 MG tablet Take 1 tablet (12.5 mg total) by mouth every 6 (six) hours as needed for nausea or vomiting. 09/26/19  Yes Trinna Post, PA-C    Review of Systems  Constitutional: Positive for fatigue (minimal). Negative for appetite change.  HENT: Negative for congestion, postnasal drip and sore throat.   Eyes: Positive for visual disturbance (blurry vision at times).  Respiratory: Positive for shortness of breath (with moderate exertion). Negative for cough and wheezing.   Cardiovascular: Negative for chest pain, palpitations and leg swelling.  Gastrointestinal: Positive for abdominal distention (comes and goes). Negative for abdominal pain and nausea.  Endocrine: Negative.   Genitourinary: Negative.   Musculoskeletal: Negative for arthralgias, back pain and neck pain.  Skin: Negative.   Allergic/Immunologic: Negative.   Neurological: Positive for dizziness ("vertigo"). Negative for light-headedness.  Hematological: Negative for adenopathy. Does not bruise/bleed easily.  Psychiatric/Behavioral: Positive for sleep disturbance (oxygen at 4L at bedtime, 4 pillows). Negative for dysphoric mood. The patient is not nervous/anxious.    Vitals:   10/02/19 1526  BP: (!) 149/83  Pulse: 85  Resp: 20  SpO2: 100%  Weight: 292 lb 9.6 oz (132.7 kg)  Height: 5\' 4"  (1.626 m)   Wt Readings from Last 3 Encounters:  10/02/19 292 lb 9.6 oz (132.7 kg)  09/27/19 292 lb 6.4 oz (132.6 kg)  08/24/19 286 lb (129.7 kg)   Lab Results  Component Value Date   CREATININE 0.68 08/24/2019   CREATININE 0.59 12/27/2018   CREATININE 0.65 12/12/2018    Physical Exam Vitals signs and nursing note reviewed.  Constitutional:      Appearance: She is well-developed.  HENT:     Head: Normocephalic and atraumatic.  Neck:     Musculoskeletal: Normal range of motion and neck supple.      Vascular: No JVD.  Cardiovascular:     Rate and Rhythm: Normal rate and regular rhythm.  Pulmonary:     Effort: Pulmonary effort is normal. No respiratory distress.     Breath sounds: Wheezing present. No rales.  Abdominal:     General: There is no distension.  Palpations: Abdomen is soft.  Musculoskeletal:     Right lower leg: She exhibits no tenderness. No edema.     Left lower leg: She exhibits no tenderness. No edema.  Skin:    General: Skin is warm and dry.  Neurological:     Mental Status: She is alert and oriented to person, place, and time.     Sensory: Sensation is intact.     Motor: Motor function is intact.  Psychiatric:        Mood and Affect: Mood is not anxious.        Behavior: Behavior normal.    Assessment & Plan:  1: Chronic heart failure with preserved ejection fraction- - NYHA class III - euvolemic today - weighing daily and she was instructed to call for an overnight weight gain of >2 pounds or a weekly weight gain of >5 pounds - weight up 22 pounds since last visit 10 months ago - not adding salt and has been reading food labels. Reviewed the importance of closely following a 2000mg  sodium diet  - encouraged her to limit her fluid intake to 64 ounces daily. - BNP 12/01/2018 was 259.0 - saw cardiology ([facility]) 09/13/2019 - will get BMP today - will consider changing her lasix to torsemide after her BMP results  2: HTN- - BP rechecked and was 112/64 manually - saw PCP Terrilee Croak) 09/27/2019  - BMP 08/24/2019 reviewed and showed sodium 136, potassium 3.2, creatinine 0.68 and GFR >60  3: Atrial fibrillation- - had mitral valve replaced July 2019 - cardioverted 06/30/18  Patient did not bring her medications nor a list. Each medication was verbally reviewed with the patient and she was encouraged to bring the bottles to every visit to confirm accuracy of list.  Return in 1 month or sooner for any questions/problems before then.

## 2019-10-02 NOTE — Patient Instructions (Signed)
Continue weighing daily and call for an overnight weight gain of > 2 pounds or a weekly weight gain of >5 pounds. 

## 2019-10-03 ENCOUNTER — Telehealth: Payer: Self-pay | Admitting: Family

## 2019-10-03 ENCOUNTER — Encounter: Payer: Self-pay | Admitting: Family

## 2019-10-03 MED ORDER — POTASSIUM CHLORIDE CRYS ER 20 MEQ PO TBCR: 40.0000 meq | EXTENDED_RELEASE_TABLET | Freq: Two times a day (BID) | ORAL | 5 refills | Status: DC

## 2019-10-03 NOTE — Telephone Encounter (Signed)
Spoke with patient regarding her lab results that were obtained yesterday. Renal function looks great although potassium level is still low at 3.1.   Patient currently taking 15meq potassium AM and additional 43meq when she takes the metolazone, which is 3 times/ week.   Advised patient that she needs to increase her daily potassium to 65meq BID and continue to take an additional 75meq when she takes the metolazone. May benefit from spironolactone.   Will check a BMP at her next visit. Patient verbalized understanding.

## 2019-10-04 ENCOUNTER — Other Ambulatory Visit: Payer: Self-pay | Admitting: Physician Assistant

## 2019-10-04 DIAGNOSIS — R42 Dizziness and giddiness: Secondary | ICD-10-CM

## 2019-10-04 LAB — CYTOLOGY - PAP
Comment: NEGATIVE
Diagnosis: NEGATIVE
High risk HPV: NEGATIVE

## 2019-10-05 ENCOUNTER — Telehealth: Payer: Self-pay | Admitting: Physician Assistant

## 2019-10-05 ENCOUNTER — Telehealth (INDEPENDENT_AMBULATORY_CARE_PROVIDER_SITE_OTHER): Payer: Medicaid Other | Admitting: Physician Assistant

## 2019-10-05 DIAGNOSIS — A599 Trichomoniasis, unspecified: Secondary | ICD-10-CM

## 2019-10-05 DIAGNOSIS — R42 Dizziness and giddiness: Secondary | ICD-10-CM

## 2019-10-05 MED ORDER — MECLIZINE HCL 12.5 MG PO TABS: 25.0000 mg | ORAL_TABLET | Freq: Three times a day (TID) | ORAL | 0 refills | Status: DC | PRN

## 2019-10-05 MED ORDER — METRONIDAZOLE 500 MG PO TABS: 500.0000 mg | ORAL_TABLET | Freq: Two times a day (BID) | ORAL | 0 refills | Status: AC

## 2019-10-05 NOTE — Telephone Encounter (Signed)
-----   Message from Trinna Post, [place] sent at 10/05/2019  9:09 AM EST ----- Her PAP smear is normal and negative for HPV. It was however positive for trichomonas which will need to be treated with the antibiotic flagyl 500 mg BID x 7 days. I will send this in for her.

## 2019-10-05 NOTE — Telephone Encounter (Signed)
Pt has updated her Medicaid as of Nov. 1.   She wants to go ahead with her ENT referral.  Thanks, [facility]

## 2019-10-05 NOTE — Telephone Encounter (Addendum)
Patient advised. She is requesting Meclizine be sent to [place] before 1 pm today so they can deliver it.

## 2019-10-05 NOTE — Telephone Encounter (Signed)
New referral to ENT placed and meclizine sent.

## 2019-10-05 NOTE — Telephone Encounter (Signed)
Patient has been advised. KW 

## 2019-10-05 NOTE — Telephone Encounter (Signed)
Patient presented on 09/27/2019 for routine physical exam. Reported she had not been sexually active x 1 year. Her pap smear revealed trichomonas infection. She denies any abdominal pain, pelvic pain, vaginal discharge. Flagyl 500  Mg BID x 7 days sent to [place] for trichomonas. I have spent 15 minutes in the care and coordination of this patient.

## 2019-10-05 NOTE — Telephone Encounter (Signed)
Pt calling back on this medication.  She took her last one today. Needing a refill at: [facility] [place] (Phone) [phone] (Fax)   Thanks, [facility]

## 2019-10-09 ENCOUNTER — Ambulatory Visit: Payer: Medicaid Other | Admitting: Gastroenterology

## 2019-10-09 ENCOUNTER — Other Ambulatory Visit: Payer: Self-pay | Admitting: Physician Assistant

## 2019-10-09 ENCOUNTER — Other Ambulatory Visit: Payer: Self-pay

## 2019-10-09 ENCOUNTER — Encounter: Payer: Self-pay | Admitting: Gastroenterology

## 2019-10-09 VITALS — BP 114/78 | HR 89 | Temp 98.7°F | Wt 291.0 lb

## 2019-10-09 DIAGNOSIS — Z8719 Personal history of other diseases of the digestive system: Secondary | ICD-10-CM

## 2019-10-09 DIAGNOSIS — Z8711 Personal history of peptic ulcer disease: Secondary | ICD-10-CM

## 2019-10-09 DIAGNOSIS — Z1211 Encounter for screening for malignant neoplasm of colon: Secondary | ICD-10-CM

## 2019-10-09 DIAGNOSIS — K219 Gastro-esophageal reflux disease without esophagitis: Secondary | ICD-10-CM

## 2019-10-09 DIAGNOSIS — R42 Dizziness and giddiness: Secondary | ICD-10-CM

## 2019-10-09 MED ORDER — BISACODYL EC 5 MG PO TBEC: DELAYED_RELEASE_TABLET | ORAL | 0 refills | Status: DC

## 2019-10-09 MED ORDER — NA SULFATE-K SULFATE-MG SULF 17.5-3.13-1.6 GM/177ML PO SOLN: 708.0000 | Freq: Once | ORAL | 0 refills | Status: AC

## 2019-10-09 MED ORDER — LINACLOTIDE 145 MCG PO CAPS: 145.0000 ug | ORAL_CAPSULE | Freq: Every day | ORAL | 1 refills | Status: DC

## 2019-10-09 NOTE — Progress Notes (Signed)
Chelsea Ramsey [address]  [place]  [place], [place] [address]  Main: [phone]  Fax: [phone]   Gastroenterology Consultation  Referring Provider:     Paulene Floor Primary Care Physician:  Paulene Floor Reason for Consultation:   Epigastric pain, heartburn        HPI:    Chief Complaint  Patient presents with  . New Patient (Initial Visit)  . Gastroesophageal Reflux    Patient states she was diagnosed with ulcer and they were bleeding in the hospital     Chelsea Ramsey is a 53 y.o. y/o female referred for consultation & management  by Dr. Terrilee Croak, Wendee Beavers, PA-C.  Patient reports daily epigastric pain, especially after eating.  Also heartburn multiple times a day.  Takes omeprazole 40 mg twice daily and sucralfate and this does not lead to significant improvement in her symptoms.  No dysphagia.  No nausea or vomiting.  No weight loss.  Describes the pain as dull, 5 or 10, nonradiating.  Has had a previous EGD in July 2019 due to melena and this reported small gastric ulcers. also had a colonoscopy in July 2019 for melena.  Prep was noted to be inadequate.  Hemorrhoids were found.  Diverticulosis was noted.  No other colonoscopies besides that.  Also reports taking 5 Colace a day, and lactulose every day, and still reporting constipation despite that.  Has a bowel movement every 2 to 3 days and has to strain with them.  No blood in stool  Past Medical History:  Diagnosis Date  . Acute drug-induced gout of left foot 03/01/2018   Last Assessment & Plan:  Likely at least partially brought on by diuresis.  Needs to continue to diurese  Will push hydration Stop allopurinol given initiation during acute flare may worsen this, re-broach this when asymptomatic Avoid nsaids given stomach pain Trial colchicine Add acetaminophen Ice, elevate, rest  . Allergy    seasonal  . Anxiety   . Arthritis    Right Knee  . Asthma   . CHF  (congestive heart failure) ([place])   . COPD (chronic obstructive pulmonary disease) ([place])   . Coronary artery disease    Leaky heart valve  . Fibromyalgia   . GERD (gastroesophageal reflux disease)   . Hypertension   . Pneumonia   . PUD (peptic ulcer disease)   . Pulmonary HTN ([place])   . Rheumatic fever/heart disease   . Sleep apnea     Past Surgical History:  Procedure Laterality Date  . ADENOIDECTOMY    . ESOPHAGOGASTRODUODENOSCOPY (EGD) WITH PROPOFOL N/A 05/25/2018   Procedure: ESOPHAGOGASTRODUODENOSCOPY (EGD) WITH PROPOFOL;  Surgeon: Lucilla Lame, MD;  Location: [facility];  Service: Endoscopy;  Laterality: N/A;  . MITRAL VALVE REPLACEMENT    . MULTIPLE TOOTH EXTRACTIONS    . TONSILLECTOMY    . TOTAL KNEE ARTHROPLASTY Right 09/04/2016   Procedure: TOTAL KNEE ARTHROPLASTY; with lateral release;  Surgeon: Earlie Server, MD;  Location: [place];  Service: Orthopedics;  Laterality: Right;    Prior to Admission medications   Medication Sig Start Date End Date Taking? Authorizing Provider  albuterol (PROAIR HFA) 108 (90 Base) MCG/ACT inhaler Inhale 1-2 puffs into the lungs every 6 (six) hours as needed for wheezing or shortness of breath.   Yes [provider]  allopurinol (ZYLOPRIM) 100 MG tablet Take 1 tablet (100 mg total) by mouth 2 (two) times daily. 09/13/19 03/11/20 Yes Milinda Pointer, MD  butalbital-acetaminophen-caffeine (  FIORICET, ESGIC) 50-325-40 MG tablet Take 1 tablet by mouth every 6 (six) hours as needed.    Yes [provider]  Calcium Carbonate-Vit D-Min (GNP CALCIUM 1200) 1200-1000 MG-UNIT CHEW Chew 1,200 mg by mouth daily with breakfast. Take in combination with vitamin D and magnesium. 09/13/19 09/12/20 Yes Milinda Pointer, MD  Cholecalciferol (D 5000) 125 MCG (5000 UT) capsule Take by mouth. 11/14/18  Yes [provider]  colchicine 0.6 MG tablet Take 1 tablet (0.6 mg total) by mouth daily. As directed 09/13/19 03/11/20 Yes Milinda Pointer, MD  diazepam (VALIUM) 2 MG tablet Take 2 mg by mouth every 8 (eight) hours as needed for anxiety.   Yes [provider]  docusate sodium (COLACE) 100 MG capsule Take 200 mg by mouth daily.   Yes [provider]  Fluticasone-Umeclidin-Vilant (TRELEGY ELLIPTA) 100-62.5-25 MCG/INH AEPB Inhale 1 puff into the lungs every morning. 08/24/19 11/22/19 Yes Trinna Post, PA-C  furosemide (LASIX) 40 MG tablet Take 2 tablets twice daily Patient taking differently: Take 2 tablets in the am and 1 tablet at night. 08/23/19  Yes Hackney, Tina A, FNP  gabapentin (NEURONTIN) 600 MG tablet Take 0.5 tablets (300 mg total) by mouth 2 (two) times daily. Patient taking differently: Take 600 mg by mouth 3 (three) times daily.  12/06/18  Yes Vaughan Basta, MD  guaiFENesin (MUCINEX) 600 MG 12 hr tablet Take 1 tablet (600 mg total) by mouth 2 (two) times daily. 11/19/18  Yes Salary, Montell D, MD  ipratropium-albuterol (DUONEB) 0.5-2.5 (3) MG/3ML SOLN Inhale 3 mLs into the lungs 3 (three) times daily as needed (respiratory). 08/24/19 01/13/23 Yes Trinna Post, PA-C  lactulose (CHRONULAC) 10 GM/15ML solution Take 45 mLs (30 g total) by mouth 2 (two) times daily as needed for mild constipation. 07/21/18  Yes Epifanio Lesches, MD  meclizine (ANTIVERT) 12.5 MG tablet Take 2 tablets (25 mg total) by mouth 3 (three) times daily as needed for dizziness. 10/05/19  Yes Carles Collet M, PA-C  metolazone (ZAROXOLYN) 2.5 MG tablet Take 2.5 mg by mouth. Twice weekly   Yes [provider]  metoprolol tartrate (LOPRESSOR) 100 MG tablet Take 1 tablet (100 mg total) by mouth 2 (two) times daily. 12/21/18  Yes Hackney, Otila Kluver A, FNP  metroNIDAZOLE (FLAGYL) 500 MG tablet Take 1 tablet (500 mg total) by mouth 2 (two) times daily for 7 days. 10/05/19 10/12/19 Yes Trinna Post, PA-C  nitroGLYCERIN (NITROSTAT) 0.4 MG SL tablet Place 1 tablet (0.4 mg total) under the tongue every 5 (five) minutes  as needed for chest pain. 11/05/18  Yes Mody, Ulice Bold, MD  nortriptyline (PAMELOR) 10 MG capsule Take 20 mg by mouth at bedtime.    Yes [provider]  Oxycodone HCl 10 MG TABS Take 0.5-1 tablets (5-10 mg total) by mouth 2 (two) times daily as needed. Must last 30 days. MAX.: 2/day 09/16/19 10/16/19 Yes Milinda Pointer, MD  potassium chloride SA (KLOR-CON) 20 MEQ tablet Take 2 tablets (40 mEq total) by mouth 2 (two) times daily. And additional tablet when taking metolazone 10/03/19  Yes Hackney, Tina A, FNP  pregabalin (LYRICA) 25 MG capsule 25 mg 2 (two) times daily.  08/03/19  Yes [provider]  bisacodyl (BISACODYL) 5 MG EC tablet Take 2 tablets (10mg ) by mouth the day before your procedure between 1pm-3pm. 10/09/19   Virgel Manifold, MD  linaclotide [facility]) 145 MCG CAPS capsule Take 1 capsule (145 mcg total) by mouth daily before breakfast. 10/09/19 11/08/19  Virgel Manifold, MD  Na Sulfate-K Sulfate-Mg Sulf 17.5-3.13-1.6 GM/177ML SOLN Take 708 each by mouth once for 1 dose. 10/09/19 10/09/19  Virgel Manifold, MD  omeprazole (PRILOSEC) 20 MG capsule TAKE 2 TABLETS BY MOUTH TWICE A DAY BEFORE A MEAL 10/09/19   Carles Collet M, PA-C  promethazine (PHENERGAN) 12.5 MG tablet TAKE 1 TABLET BY MOUTH EVERY 6 HOURS AS NEEDED FOR NAUSEA OR VOMITING 10/09/19   Trinna Post, PA-C    Family History  Problem Relation Age of Onset  . COPD Mother   . Cancer Mother        Bone  . Asthma Mother   . Rheum arthritis Mother   . Congestive Heart Failure Father   . Breast cancer Maternal Aunt      Social History   Tobacco Use  . Smoking status: Current Every Day Smoker    Packs/day: 0.10    Types: Cigarettes  . Smokeless tobacco: Never Used  . Tobacco comment: 4/20 was not able to quit.  Still at 1-3 a day. She would like to wait to set a new quit date until we get back to class to have the extra in person support  Substance Use Topics  . Alcohol use: Never     Alcohol/week: 3.0 standard drinks    Types: 3 Cans of beer per week    Frequency: Never    Comment: 16 oz per week  . Drug use: Not Currently    Comment: Previous use of cocaine and marijuana last use 07/31/16     Allergies as of 10/09/2019 - Review Complete 10/09/2019  Allergen Reaction Noted  . Amiodarone Nausea And Vomiting 07/17/2018  . Aspirin Swelling 06/08/2015  . Flexeril [cyclobenzaprine] Swelling 07/05/2015  . Trazamine [trazodone & diet manage prod] Nausea And Vomiting 08/19/2016  . Codeine Rash 06/08/2015  . Tramadol Rash 06/08/2015    Review of Systems:    All systems reviewed and negative except where noted in HPI.   Physical Exam:  BP 114/78 (BP Location: Left Arm, Patient Position: Sitting, Cuff Size: Large)   Pulse 89   Temp 98.7 F (37.1 C) (Oral)   Wt 291 lb (132 kg)   LMP 11/08/2009 Comment: menopause  BMI 49.95 kg/m  Patient's last menstrual period was 11/08/2009. Psych:  Alert and cooperative. Normal mood and affect. General:   Alert,  Well-developed, well-nourished, pleasant and cooperative in NAD Head:  Normocephalic and atraumatic. Eyes:  Sclera clear, no icterus.   Conjunctiva pink. Ears:  Normal auditory acuity. Nose:  No deformity, discharge, or lesions. Mouth:  No deformity or lesions,oropharynx pink & moist. Neck:  Supple; no masses or thyromegaly. Abdomen:  Normal bowel sounds.  No bruits.  Soft, non-tender and non-distended without masses, hepatosplenomegaly or hernias noted.  No guarding or rebound tenderness.    Msk:  Symmetrical without gross deformities. Good, equal movement & strength bilaterally. Pulses:  Normal pulses noted. Extremities:  No clubbing or edema.  No cyanosis. Neurologic:  Alert and oriented x3;  grossly normal neurologically. Skin:  Intact without significant lesions or rashes. No jaundice. Lymph Nodes:  No significant cervical adenopathy. Psych:  Alert and cooperative. Normal mood and affect.   Labs: CBC     Component Value Date/Time   WBC 10.7 (H) 08/24/2019 1654   RBC 4.88 08/24/2019 1654   HGB 11.7 (L) 08/24/2019 1654   HGB 13.3 02/20/2015 1015   HCT 38.0 08/24/2019 1654   HCT 41.4 02/20/2015 1015   PLT 298  08/24/2019 1654   PLT 182 02/20/2015 1015   MCV 77.9 (L) 08/24/2019 1654   MCV 90 02/20/2015 1015   MCH 24.0 (L) 08/24/2019 1654   MCHC 30.8 08/24/2019 1654   RDW 17.1 (H) 08/24/2019 1654   RDW 13.7 02/20/2015 1015   LYMPHSABS 2.3 08/24/2019 1654   LYMPHSABS 1.4 02/20/2015 1015   MONOABS 0.7 08/24/2019 1654   MONOABS 0.7 02/20/2015 1015   EOSABS 0.1 08/24/2019 1654   EOSABS 0.1 02/20/2015 1015   BASOSABS 0.0 08/24/2019 1654   BASOSABS 0.0 02/20/2015 1015   CMP     Component Value Date/Time   NA 138 10/02/2019 1612   NA 144 10/10/2018 1457   NA 138 02/20/2015 1015   K 3.1 (L) 10/02/2019 1612   K 3.7 02/20/2015 1015   CL 94 (L) 10/02/2019 1612   CL 104 02/20/2015 1015   CO2 32 10/02/2019 1612   CO2 26 02/20/2015 1015   GLUCOSE 102 (H) 10/02/2019 1612   GLUCOSE 97 02/20/2015 1015   BUN 19 10/02/2019 1612   BUN 11 10/10/2018 1457   BUN 14 02/20/2015 1015   CREATININE 0.67 10/02/2019 1612   CREATININE 0.58 02/20/2015 1015   CALCIUM 9.0 10/02/2019 1612   CALCIUM 9.0 02/20/2015 1015   PROT 8.2 (H) 08/24/2019 1654   PROT 7.3 10/10/2018 1457   PROT 7.5 02/20/2015 1015   ALBUMIN 4.0 08/24/2019 1654   ALBUMIN 4.4 10/10/2018 1457   ALBUMIN 3.8 02/20/2015 1015   AST 21 08/24/2019 1654   AST 41 02/20/2015 1015   ALT 17 08/24/2019 1654   ALT 32 02/20/2015 1015   ALKPHOS 138 (H) 08/24/2019 1654   ALKPHOS 158 (H) 02/20/2015 1015   BILITOT 0.5 08/24/2019 1654   BILITOT <0.2 10/10/2018 1457   BILITOT 1.0 02/20/2015 1015   GFRNONAA >60 10/02/2019 1612   GFRNONAA >60 02/20/2015 1015   GFRAA >60 10/02/2019 1612   GFRAA >60 02/20/2015 1015    Imaging Studies: No results found.  Assessment and Plan:   Chelsea Ramsey is a 53 y.o. y/o female has been  referred for epigastric pain, constipation  Epigastric pain and heartburn are all likely manifestation of her GERD However, given her ongoing symptoms despite full dose PPI, and previous history of gastric ulcers, EGD is indicated to rule rule out any other underlying ulcers, obtain gastric biopsies and for her acid reflux is well  She is also due for screening colonoscopy given that the last 1 had been inadequate prep  Due to her constipation despite taking multiple Colace a day and lactulose, will start her on Linzess  I have asked her to discontinue the Colace and if the Linzess starts causing diarrhea, to stop the lactulose as well  I have discussed alternative options, risks & benefits,  which include, but are not limited to, bleeding, infection, perforation,respiratory complication & drug reaction.  The patient agrees with this plan & written consent will be obtained.       Dr Chelsea Ramsey  Speech recognition software was used to dictate the above note.

## 2019-10-09 NOTE — Patient Instructions (Signed)
Please start taking Linzess daily. Please stop taking the Colace

## 2019-10-09 NOTE — Telephone Encounter (Signed)
L.O.V. was on 09/27/2019.

## 2019-10-10 ENCOUNTER — Telehealth: Payer: Self-pay | Admitting: Gastroenterology

## 2019-10-10 NOTE — Telephone Encounter (Signed)
Cris from [place] left vm to clarify quality of suprep please call [phone]

## 2019-10-10 NOTE — Telephone Encounter (Signed)
Tried to call the pharmacy but they do not open till 9 will call back

## 2019-10-10 NOTE — Telephone Encounter (Signed)
Informed pharmacy that it is a 2 day prep and she needs 2 boxes. Gerald Stabs verbalized understanding and will get medication for patient.

## 2019-10-11 ENCOUNTER — Telehealth: Payer: Self-pay

## 2019-10-11 ENCOUNTER — Telehealth: Payer: Self-pay | Admitting: Gastroenterology

## 2019-10-11 NOTE — Telephone Encounter (Signed)
Per Cardiologist he wants patient to stop the Xarelto 2 days prior to procedure. Procedure is on 10/25/2019. Patient will stop on 10/23/2019. Patient can restart medication 1 day prior to the procedure. Cardiologist did clear patient for the procedure

## 2019-10-11 NOTE — Telephone Encounter (Signed)
Moved patient to 11/01/2019 and COVID test on 10/27/2019. Patient wanted me to sent dates through [place] sent a mychart message

## 2019-10-11 NOTE — Telephone Encounter (Signed)
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

## 2019-10-16 ENCOUNTER — Other Ambulatory Visit: Payer: Self-pay | Admitting: Physician Assistant

## 2019-10-16 DIAGNOSIS — R42 Dizziness and giddiness: Secondary | ICD-10-CM

## 2019-10-16 NOTE — Telephone Encounter (Signed)
Patient calling to check the status of this request. Patient states she is out of this medication.

## 2019-10-16 NOTE — Telephone Encounter (Signed)
Requested medication (s) are due for refill today: yes  Requested medication (s) are on the active medication list: yes  Last refill:  10/05/19 #30  Future visit scheduled: no  Notes to clinic:  Patient is out of medication. Refill not delegated per protocol    Requested Prescriptions  Pending Prescriptions Disp Refills   meclizine (ANTIVERT) 12.5 MG tablet [Pharmacy Med Name: MECLIZINE HCL 12.5 MG TAB] 30 tablet 0    Sig: TAKE 2 TABLETS (25 MG TOTAL) BY MOUTH 3 TIMES DAILY AS NEEDED FOR DIZZINESS     Not Delegated - Gastroenterology: Antiemetics Failed - 10/16/2019 10:54 AM      Failed - This refill cannot be delegated      Passed - Valid encounter within last 6 months    Recent Outpatient Visits          2 weeks ago Annual physical exam   [facility] [place], Fabio Bering M, PA-C   1 month ago Chronic obstructive pulmonary disease, unspecified COPD type [facility])   [place], Wendee Beavers, PA-C      Future Appointments            In 1 month Milinda Pointer, MD [place]   In 1 month Tah[place], Lennette Bihari, MD [place]   In 5 months Terrilee Croak, Wendee Beavers, PA-C [employer]

## 2019-10-17 ENCOUNTER — Telehealth: Payer: Self-pay | Admitting: Gastroenterology

## 2019-10-17 NOTE — Telephone Encounter (Signed)
Pt is calling to reschedule her procedure again she states she is so sorry to keep on rescheduling please call pt

## 2019-10-17 NOTE — Telephone Encounter (Signed)
Called patient back and patient wants to be moved to 11/06/2019. 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

## 2019-10-18 ENCOUNTER — Other Ambulatory Visit: Payer: Self-pay | Admitting: Physician Assistant

## 2019-10-18 DIAGNOSIS — R42 Dizziness and giddiness: Secondary | ICD-10-CM

## 2019-10-18 NOTE — Telephone Encounter (Signed)
Requested medication (s) are due for refill today: yes  Requested medication (s) are on the active medication list: yes  Last refill: 10/09/2019  #30  0 refills  Future visit scheduled yes  Notes to clinic:not delegated  Requested Prescriptions  Pending Prescriptions Disp Refills   promethazine (PHENERGAN) 12.5 MG tablet [Pharmacy Med Name: PROMETHAZINE HCL 12.5 MG TAB] 30 tablet 0    Sig: TAKE 1 TABLET BY MOUTH EVERY 6 HOURS AS NEEDED FOR NAUSEA OR VOMITING     Not Delegated - Gastroenterology: Antiemetics Failed - 10/18/2019  5:37 PM      Failed - This refill cannot be delegated      Passed - Valid encounter within last 6 months    Recent Outpatient Visits          3 weeks ago Annual physical exam   [place], [place], PA-C   1 month ago Chronic obstructive pulmonary disease, unspecified COPD type [facility])   [place], Wendee Beavers, PA-C      Future Appointments            In 1 month Milinda Pointer, MD [place]   In 1 month Tahiliani, Lennette Bihari, MD [place]   In 5 months Terrilee Croak, Wendee Beavers, [place], [facility]

## 2019-10-20 NOTE — Telephone Encounter (Signed)
Patient calling to check status. Patient has one tablet left.

## 2019-10-24 ENCOUNTER — Other Ambulatory Visit: Payer: Self-pay | Admitting: Physician Assistant

## 2019-10-24 DIAGNOSIS — R42 Dizziness and giddiness: Secondary | ICD-10-CM

## 2019-10-24 DIAGNOSIS — J449 Chronic obstructive pulmonary disease, unspecified: Secondary | ICD-10-CM

## 2019-10-24 NOTE — Telephone Encounter (Signed)
The patient states that she has an appointment scheduled in January with ENT at the [place] [facility] but would like [place] to refill until she Lucianne Lei be seen by them.

## 2019-10-24 NOTE — Telephone Encounter (Signed)
Requested medication (s) are due for refill today: no  Requested medication (s) are on the active medication list: yes  Last refill:  10/20/2019  Future visit scheduled: yes  Notes to clinic:  Refill cannot be delegated    Requested Prescriptions  Pending Prescriptions Disp Refills   promethazine (PHENERGAN) 12.5 MG tablet [Pharmacy Med Name: PROMETHAZINE HCL 12.5 MG TAB] 30 tablet 0    Sig: TAKE 1 TABLET BY MOUTH EVERY 6 HOURS AS NEEDED FOR NAUSEA OR VOMITING     Not Delegated - Gastroenterology: Antiemetics Failed - 10/24/2019  9:42 AM      Failed - This refill cannot be delegated      Passed - Valid encounter within last 6 months    Recent Outpatient Visits          3 weeks ago Annual physical exam   [facility] [place], Adriana M, PA-C   2 months ago Chronic obstructive pulmonary disease, unspecified COPD type [facility])   [place], Wendee Beavers, PA-C      Future Appointments            In 1 month Milinda Pointer, MD [place]   In 1 month Tahiliani, Lennette Bihari, MD [place]   In 5 months [place], Adriana M, PA-C [employer], [facility]           Signed Prescriptions Disp Refills   ipratropium-albuterol (DUONEB) 0.5-2.5 (3) MG/3ML SOLN 360 mL 1    Sig: INHALE THE CONTENTS OF 1 VIAL(3MLS) VIA NEBULIZER 3 TIMES DAILY AS NEEDED     Pulmonology:  Comb[employer] Passed - 10/24/2019  9:42 AM      Passed - Valid encounter within last 12 months    Recent Outpatient Visits          3 weeks ago Annual physical exam   [facility], Adriana M, PA-C   2 months ago Chronic obstructive pulmonary disease, unspecified COPD type [facility])   [place], Wendee Beavers, PA-C      Future Appointments            In 1 month Milinda Pointer, MD [place]   In 1 month Tahiliani, Lennette Bihari, MD  [place]   In 5 months Terrilee Croak, Wendee Beavers, [place], [facility]

## 2019-10-24 NOTE — Telephone Encounter (Signed)
Requested medication (s) are due for refill today: yes  Requested medication (s) are on the active medication list: yes  Last refill:  09/26/2019  Future visit scheduled: yes  Notes to clinic:  Refill cannot be delegated    Requested Prescriptions  Pending Prescriptions Disp Refills   meclizine (ANTIVERT) 12.5 MG tablet [Pharmacy Med Name: MECLIZINE HCL 12.5 MG TAB] 30 tablet 0    Sig: TAKE 2 TABLETS BY MOUTH 3 TIMES DAILY ASNEEDED FOR DIZZINESS     Not Delegated - Gastroenterology: Antiemetics Failed - 10/24/2019  9:43 AM      Failed - This refill cannot be delegated      Passed - Valid encounter within last 6 months    Recent Outpatient Visits          3 weeks ago Annual physical exam   [place], [place], PA-C   2 months ago Chronic obstructive pulmonary disease, unspecified COPD type [facility])   [place], Wendee Beavers, PA-C      Future Appointments            In 1 month Milinda Pointer, MD [place]   In 1 month Tahiliani, Lennette Bihari, MD [place]   In 5 months Terrilee Croak, Wendee Beavers, PA-C [employer], [facility]

## 2019-10-24 NOTE — Telephone Encounter (Signed)
Did she see [facility]? She was supposed to have a visit with them on 10/01/2019 to further evaluate this issue and recommend treatment.

## 2019-11-01 ENCOUNTER — Ambulatory Visit: Payer: Medicaid Other | Admitting: Family

## 2019-11-02 ENCOUNTER — Other Ambulatory Visit: Payer: Self-pay

## 2019-11-02 ENCOUNTER — Other Ambulatory Visit
Admission: RE | Admit: 2019-11-02 | Discharge: 2019-11-02 | Disposition: A | Discharge status: Home / Self Care | Payer: Medicaid Other | Source: Ambulatory Visit | Attending: Gastroenterology | Admitting: Gastroenterology

## 2019-11-02 DIAGNOSIS — Z20828 Contact with and (suspected) exposure to other viral communicable diseases: Secondary | ICD-10-CM | POA: Diagnosis not present

## 2019-11-02 DIAGNOSIS — Z01812 Encounter for preprocedural laboratory examination: Secondary | ICD-10-CM | POA: Diagnosis not present

## 2019-11-03 LAB — SARS CORONAVIRUS 2 (TAT 6-24 HRS): SARS Coronavirus 2: NEGATIVE

## 2019-11-06 ENCOUNTER — Ambulatory Visit: Payer: Medicaid Other | Admitting: Certified Registered Nurse Anesthetist

## 2019-11-06 ENCOUNTER — Ambulatory Visit
Admission: RE | Admit: 2019-11-06 | Discharge: 2019-11-06 | Disposition: A | Discharge status: Home / Self Care | Payer: Medicaid Other | Attending: Gastroenterology | Admitting: Gastroenterology

## 2019-11-06 ENCOUNTER — Telehealth: Payer: Self-pay

## 2019-11-06 ENCOUNTER — Encounter
Admission: RE | Disposition: A | Discharge status: Home / Self Care | Payer: Self-pay | Source: Home / Self Care | Attending: Gastroenterology

## 2019-11-06 ENCOUNTER — Encounter: Payer: Self-pay | Admitting: *Deleted

## 2019-11-06 DIAGNOSIS — I2510 Atherosclerotic heart disease of native coronary artery without angina pectoris: Secondary | ICD-10-CM | POA: Insufficient documentation

## 2019-11-06 DIAGNOSIS — K319 Disease of stomach and duodenum, unspecified: Secondary | ICD-10-CM | POA: Insufficient documentation

## 2019-11-06 DIAGNOSIS — K219 Gastro-esophageal reflux disease without esophagitis: Secondary | ICD-10-CM | POA: Insufficient documentation

## 2019-11-06 DIAGNOSIS — Z96651 Presence of right artificial knee joint: Secondary | ICD-10-CM | POA: Insufficient documentation

## 2019-11-06 DIAGNOSIS — Z8719 Personal history of other diseases of the digestive system: Secondary | ICD-10-CM

## 2019-11-06 DIAGNOSIS — I110 Hypertensive heart disease with heart failure: Secondary | ICD-10-CM | POA: Diagnosis not present

## 2019-11-06 DIAGNOSIS — G4730 Sleep apnea, unspecified: Secondary | ICD-10-CM | POA: Insufficient documentation

## 2019-11-06 DIAGNOSIS — M797 Fibromyalgia: Secondary | ICD-10-CM | POA: Insufficient documentation

## 2019-11-06 DIAGNOSIS — K5730 Diverticulosis of large intestine without perforation or abscess without bleeding: Secondary | ICD-10-CM | POA: Insufficient documentation

## 2019-11-06 DIAGNOSIS — F419 Anxiety disorder, unspecified: Secondary | ICD-10-CM | POA: Insufficient documentation

## 2019-11-06 DIAGNOSIS — Z951 Presence of aortocoronary bypass graft: Secondary | ICD-10-CM | POA: Diagnosis not present

## 2019-11-06 DIAGNOSIS — R1013 Epigastric pain: Secondary | ICD-10-CM | POA: Diagnosis not present

## 2019-11-06 DIAGNOSIS — K228 Other specified diseases of esophagus: Secondary | ICD-10-CM | POA: Diagnosis not present

## 2019-11-06 DIAGNOSIS — J449 Chronic obstructive pulmonary disease, unspecified: Secondary | ICD-10-CM | POA: Insufficient documentation

## 2019-11-06 DIAGNOSIS — Z8711 Personal history of peptic ulcer disease: Secondary | ICD-10-CM

## 2019-11-06 DIAGNOSIS — Z7901 Long term (current) use of anticoagulants: Secondary | ICD-10-CM | POA: Diagnosis not present

## 2019-11-06 DIAGNOSIS — Z1211 Encounter for screening for malignant neoplasm of colon: Secondary | ICD-10-CM

## 2019-11-06 DIAGNOSIS — Z79899 Other long term (current) drug therapy: Secondary | ICD-10-CM | POA: Diagnosis not present

## 2019-11-06 DIAGNOSIS — R1319 Other dysphagia: Secondary | ICD-10-CM

## 2019-11-06 DIAGNOSIS — R1310 Dysphagia, unspecified: Secondary | ICD-10-CM | POA: Diagnosis not present

## 2019-11-06 DIAGNOSIS — I509 Heart failure, unspecified: Secondary | ICD-10-CM | POA: Insufficient documentation

## 2019-11-06 DIAGNOSIS — M10272 Drug-induced gout, left ankle and foot: Secondary | ICD-10-CM | POA: Insufficient documentation

## 2019-11-06 DIAGNOSIS — F17210 Nicotine dependence, cigarettes, uncomplicated: Secondary | ICD-10-CM | POA: Diagnosis not present

## 2019-11-06 DIAGNOSIS — Z85830 Personal history of malignant neoplasm of bone: Secondary | ICD-10-CM | POA: Insufficient documentation

## 2019-11-06 DIAGNOSIS — K3189 Other diseases of stomach and duodenum: Secondary | ICD-10-CM

## 2019-11-06 HISTORY — PX: ESOPHAGOGASTRODUODENOSCOPY (EGD) WITH PROPOFOL: SHX5813

## 2019-11-06 HISTORY — PX: COLONOSCOPY WITH PROPOFOL: SHX5780

## 2019-11-06 HISTORY — DX: Cardiac arrhythmia, unspecified: I49.9

## 2019-11-06 LAB — GLUCOSE, CAPILLARY: Glucose-Capillary: 97 mg/dL (ref 70–99)

## 2019-11-06 SURGERY — COLONOSCOPY WITH PROPOFOL
Anesthesia: General

## 2019-11-06 MED ORDER — SODIUM CHLORIDE 0.9 % IV SOLN
INTRAVENOUS | Status: DC
  Administered 2019-11-06: 11:00:00 via INTRAVENOUS

## 2019-11-06 MED ORDER — PROPOFOL 500 MG/50ML IV EMUL
INTRAVENOUS | Status: DC | PRN
  Administered 2019-11-06: 175 ug/kg/min via INTRAVENOUS

## 2019-11-06 MED ORDER — LIDOCAINE HCL (CARDIAC) PF 100 MG/5ML IV SOSY
PREFILLED_SYRINGE | INTRAVENOUS | Status: DC | PRN
  Administered 2019-11-06: 50 mg via INTRAVENOUS

## 2019-11-06 MED ORDER — PROPOFOL 500 MG/50ML IV EMUL
INTRAVENOUS | Status: AC
  Filled 2019-11-06: qty 50

## 2019-11-06 MED ORDER — PROPOFOL 10 MG/ML IV BOLUS
INTRAVENOUS | Status: DC | PRN
  Administered 2019-11-06: 50 mg via INTRAVENOUS

## 2019-11-06 NOTE — Anesthesia Preprocedure Evaluation (Signed)
Anesthesia Evaluation  Patient identified by MRN, date of birth, ID band Patient awake    Reviewed: Allergy & Precautions, H&P , NPO status , Patient's Chart, lab work & pertinent test results  Airway Mallampati: III  TM Distance: >3 FB     Dental  (+) Edentulous Lower, Edentulous Upper   Pulmonary shortness of breath, asthma , sleep apnea and Oxygen sleep apnea , COPD (4L [place] at night, also has OSA but awaiting CPAP),  COPD inhaler and oxygen dependent, Current Smoker,  Mod to severe pulmonary hypertension          Cardiovascular hypertension, + CAD and +CHF  + Valvular Problems/Murmurs (s/p MVR 1 year ago)      Neuro/Psych  Headaches, PSYCHIATRIC DISORDERS Anxiety Depression    GI/Hepatic Neg liver ROS, PUD, GERD  ,  Endo/Other  Morbid obesity (super morbid obesity BMI 50)  Renal/GU negative Renal ROS  negative genitourinary   Musculoskeletal   Abdominal   Peds  Hematology negative hematology ROS (+)   Anesthesia Other Findings Past Medical History: 03/01/2018: Acute drug-induced gout of left foot     Comment:  Last Assessment & Plan:  Likely at least partially               brought on by diuresis.  Needs to continue to diurese                Will push hydration Stop allopurinol given initiation               during acute flare may worsen this, re-broach this when               asymptomatic Avoid nsaids given stomach pain Trial               colchicine Add acetaminophen Ice, elevate, rest No date: Allergy     Comment:  seasonal No date: Anxiety No date: Arthritis     Comment:  Right Knee No date: Asthma No date: CHF (congestive heart failure) (HCC) No date: COPD (chronic obstructive pulmonary disease) (HCC) No date: Coronary artery disease     Comment:  Leaky heart valve No date: Dysrhythmia No date: Fibromyalgia No date: GERD (gastroesophageal reflux disease) No date: Hypertension No date: Pneumonia No  date: PUD (peptic ulcer disease) No date: Pulmonary HTN (HCC) No date: Rheumatic fever/heart disease No date: Sleep apnea  Past Surgical History: No date: ADENOIDECTOMY No date: CORONARY ARTERY BYPASS GRAFT     Comment:  June 27 2018 05/25/2018: ESOPHAGOGASTRODUODENOSCOPY (EGD) WITH PROPOFOL; N/A     Comment:  Procedure: ESOPHAGOGASTRODUODENOSCOPY (EGD) WITH               PROPOFOL;  Surgeon: Lucilla Lame, MD;  Location: [facility]               ENDOSCOPY;  Service: Endoscopy;  Laterality: N/A; No date: MITRAL VALVE REPLACEMENT No date: MULTIPLE TOOTH EXTRACTIONS No date: TONSILLECTOMY 09/04/2016: TOTAL KNEE ARTHROPLASTY; Right     Comment:  Procedure: TOTAL KNEE ARTHROPLASTY; with lateral               release;  Surgeon: Earlie Server, MD;  Location: [place];               Service: Orthopedics;  Laterality: Right;  BMI    Body Mass Index: 49.78 kg/m      Reproductive/Obstetrics negative OB ROS  Anesthesia Physical Anesthesia Plan  ASA: IV  Anesthesia Plan: General   Post-op Pain Management:    Induction:   PONV Risk Score and Plan: Propofol infusion and TIVA  Airway Management Planned: Nasal CPAP  Additional Equipment:   Intra-op Plan:   Post-operative Plan:   Informed Consent: I have reviewed the patients History and Physical, chart, labs and discussed the procedure including the risks, benefits and alternatives for the proposed anesthesia with the patient or authorized representative who has indicated his/her understanding and acceptance.     Dental Advisory Given  Plan Discussed with: Anesthesiologist  Anesthesia Plan Comments:         Anesthesia Quick Evaluation

## 2019-11-06 NOTE — Anesthesia Procedure Notes (Signed)
Date/Time: 11/06/2019 12:15 PM Performed by: Doreen Salvage, CRNA Pre-anesthesia Checklist: Patient identified, Emergency Drugs available, Suction available and Patient being monitored Patient Re-evaluated:Patient Re-evaluated prior to induction Oxygen Delivery Method: Supernova nasal CPAP Induction Type: IV induction Dental Injury: Teeth and Oropharynx as per pre-operative assessment  Comments: Nasal cannula with etCO2 monitoring

## 2019-11-06 NOTE — Anesthesia Post-op Follow-up Note (Signed)
Anesthesia QCDR form completed.        

## 2019-11-06 NOTE — Transfer of Care (Signed)
Immediate Anesthesia Transfer of Care Note  Patient: Chelsea Ramsey  Procedure(s) Performed: Procedure(s) with comments: COLONOSCOPY WITH PROPOFOL (N/A) - 2 day prep  ESOPHAGOGASTRODUODENOSCOPY (EGD) WITH PROPOFOL (N/A)  Patient Location: PACU and Endoscopy Unit  Anesthesia Type:General  Level of Consciousness: sedated  Airway & Oxygen Therapy: Patient Spontanous Breathing and Patient connected to nasal cannula oxygen  Post-op Assessment: Report given to RN and Post -op Vital signs reviewed and stable  Post vital signs: Reviewed and stable  Last Vitals:  Vitals:   11/06/19 1023 11/06/19 1305  BP: 111/72 (!) 99/55  Pulse: 72 73  Resp: 16 16  Temp: (!) 36.2 C   SpO2: [id number]    Complications: No apparent anesthesia complications

## 2019-11-06 NOTE — Telephone Encounter (Signed)
Patient states she does not know when she is going to be able to scheduled the 2 day prep colonoscopy. Patient states she is going to have to find a ride before she is able to scheduled this. Informed patient to just give [place] a call back when she is ready to scheduled this.

## 2019-11-06 NOTE — Op Note (Signed)
[facility] Gastroenterology Patient Name: Chelsea Ramsey Procedure Date: 11/06/2019 12:10 PM MRN: [id number] Account #: [id number] Date of Birth: 12-25-1965 Admit Type: Outpatient Age: 53 Room: [facility] ENDO ROOM 3 Gender: Female Note Status: Finalized Procedure:             Colonoscopy Indications:           Screening for colorectal malignant neoplasm Providers:              B. Bonna Gains MD, MD Referring MD:          Kipp Brood. Margretta Sidle (Referring MD) Medicines:             Monitored Anesthesia Care Complications:         No immediate complications. Procedure:             Pre-Anesthesia Assessment:                        - Prior to the procedure, a History and Physical was                         performed, and patient medications, allergies and                         sensitivities were reviewed. The patient's tolerance                         of previous anesthesia was reviewed.                        - The risks and benefits of the procedure and the                         sedation options and risks were discussed with the                         patient. All questions were answered and informed                         consent was obtained.                        - Patient identification and proposed procedure were                         verified prior to the procedure by the physician, the                         nurse, the anesthetist and the technician. The                         procedure was verified in the pre-procedure area in                         the procedure room in the endoscopy suite.                        - ASA Grade Assessment: II - A patient with mild  systemic disease.                        - After reviewing the risks and benefits, the patient                         was deemed in satisfactory condition to undergo the                         procedure.                        After obtaining informed consent,  the colonoscope was                         passed under direct vision. Throughout the procedure,                         the patient's blood pressure, pulse, and oxygen                         saturations were monitored continuously. The                         Colonoscope was introduced through the anus and                         advanced to the the cecum, identified by appendiceal                         orifice and ileocecal valve. The colonoscopy was                         performed with ease. The patient tolerated the                         procedure well. The quality of the bowel preparation                         was poor. Findings:      The perianal and digital rectal examinations were normal.      A large amount of solid stool was found in the entire colon, interfering       with visualization.      Multiple diverticula were found in the sigmoid colon.      The exam was otherwise without abnormality.      The retroflexed view of the distal rectum and anal verge was normal and       showed no anal or rectal abnormalities. Impression:            - Preparation of the colon was poor.                        - Stool in the entire examined colon.                        - Diverticulosis in the sigmoid colon.                        - The examination was otherwise normal.                        -  The distal rectum and anal verge are normal on                         retroflexion view.                        - No specimens collected. Recommendation:        - Discharge patient to home.                        - Resume previous diet.                        - Continue present medications.                        - Repeat colonoscopy at the next available                         appointment, with 2 day prep because the bowel                         preparation was poor.                        - Return to primary care physician as previously                         scheduled.                         - The findings and recommendations were discussed with                         the patient.                        - The findings and recommendations were discussed with                         the patient's family.                        - High fiber diet. Procedure Code(s):     --- Professional ---                        [id number], Colonoscopy, flexible; diagnostic, including                         collection of specimen(s) by brushing or washing, when                         performed (separate procedure) Diagnosis Code(s):     --- Professional ---                        Z12.11, Encounter for screening for malignant neoplasm                         of colon CPT copyright 2019 American Medical Association. All rights reserved. The codes documented in this report are preliminary and upon coder review may  be revised to meet current compliance requirements.  Vonda Antigua, MD Margretta Sidle B. Bonna Gains MD, MD 11/06/2019 12:58:48 PM This report has been signed electronically. Number of Addenda: 0 Note Initiated On: 11/06/2019 12:10 PM Scope Withdrawal Time: 0 hours 8 minutes 26 seconds  Total Procedure Duration: 0 hours 18 minutes 2 seconds       [facility]

## 2019-11-06 NOTE — H&P (Signed)
Chelsea Antigua, MD [address] [place], [place], [place], [id number], [place], [place], [place], [address] Phone: [phone]  Fax: [phone]  Primary Care Physician:  Trinna Post, PA-C   Pre-Procedure History & Physical: HPI:  Chelsea Ramsey is a 53 y.o. female is here for a colonoscopy and EGD.   Past Medical History:  Diagnosis Date  . Acute drug-induced gout of left foot 03/01/2018   Last Assessment & Plan:  Likely at least partially brought on by diuresis.  Needs to continue to diurese  Will push hydration Stop allopurinol given initiation during acute flare may worsen this, re-broach this when asymptomatic Avoid nsaids given stomach pain Trial colchicine Add acetaminophen Ice, elevate, rest  . Allergy    seasonal  . Anxiety   . Arthritis    Right Knee  . Asthma   . CHF (congestive heart failure) ([place])   . COPD (chronic obstructive pulmonary disease) ([place])   . Coronary artery disease    Leaky heart valve  . Dysrhythmia   . Fibromyalgia   . GERD (gastroesophageal reflux disease)   . Hypertension   . Pneumonia   . PUD (peptic ulcer disease)   . Pulmonary HTN ([place])   . Rheumatic fever/heart disease   . Sleep apnea     Past Surgical History:  Procedure Laterality Date  . ADENOIDECTOMY    . CORONARY ARTERY BYPASS GRAFT     June 27 2018  . ESOPHAGOGASTRODUODENOSCOPY (EGD) WITH PROPOFOL N/A 05/25/2018   Procedure: ESOPHAGOGASTRODUODENOSCOPY (EGD) WITH PROPOFOL;  Surgeon: Lucilla Lame, MD;  Location: [facility] ENDOSCOPY;  Service: Endoscopy;  Laterality: N/A;  . MITRAL VALVE REPLACEMENT    . MULTIPLE TOOTH EXTRACTIONS    . TONSILLECTOMY    . TOTAL KNEE ARTHROPLASTY Right 09/04/2016   Procedure: TOTAL KNEE ARTHROPLASTY; with lateral release;  Surgeon: Earlie Server, MD;  Location: [facility];  Service: Orthopedics;  Laterality: Right;    Prior to Admission medications   Medication Sig Start Date End Date Taking? Authorizing Provider   albuterol (PROAIR HFA) 108 (90 Base) MCG/ACT inhaler Inhale 1-2 puffs into the lungs every 6 (six) hours as needed for wheezing or shortness of breath.   Yes [provider]  allopurinol (ZYLOPRIM) 100 MG tablet Take 1 tablet (100 mg total) by mouth 2 (two) times daily. 09/13/19 03/11/20 Yes Milinda Pointer, MD  butalbital-acetaminophen-caffeine (FIORICET, ESGIC) [phone] MG tablet Take 1 tablet by mouth every 6 (six) hours as needed.    Yes [provider]  Calcium Carbonate-Vit D-Min (GNP CALCIUM 1200) 1200-1000 MG-UNIT CHEW Chew 1,200 mg by mouth daily with breakfast. Take in combination with vitamin D and magnesium. 09/13/19 09/12/20 Yes Milinda Pointer, MD  Cholecalciferol (D 5000) 125 MCG (5000 UT) capsule Take by mouth. 11/14/18  Yes [provider]  diazepam (VALIUM) 2 MG tablet Take 2 mg by mouth every 8 (eight) hours as needed for anxiety.   Yes [provider]  furosemide (LASIX) 40 MG tablet Take 2 tablets twice daily Patient taking differently: Take 2 tablets in the am and 1 tablet at night. 08/23/19  Yes Hackney, Tina A, FNP  gabapentin (NEURONTIN) 600 MG tablet Take 0.5 tablets (300 mg total) by mouth 2 (two) times daily. Patient taking differently: Take 600 mg by mouth 3 (three) times daily.  12/06/18  Yes Vaughan Basta, MD  linaclotide [facility]) 145 MCG CAPS capsule Take 1 capsule (145 mcg total) by mouth daily before breakfast. 10/09/19 11/08/19 Yes Bertina Guthridge, Lennette Bihari,  MD  metoprolol tartrate (LOPRESSOR) 100 MG tablet Take 1 tablet (100 mg total) by mouth 2 (two) times daily. 12/21/18  Yes Hackney, Otila Kluver A, FNP  nortriptyline (PAMELOR) 10 MG capsule Take 20 mg by mouth at bedtime.    Yes [provider]  omeprazole (PRILOSEC) 20 MG capsule TAKE 2 TABLETS BY MOUTH TWICE A DAY BEFORE A MEAL 10/09/19  Yes Pollak, Adriana M, PA-C  Oxycodone HCl 10 MG TABS Take 0.5-1 tablets (5-10 mg total) by mouth 2 (two) times daily as needed. Must  last 30 days. MAX.: 2/day 09/16/19 11/06/19 Yes Milinda Pointer, MD  OXYGEN Inhale 4 L/L into the lungs.   Yes [provider]  potassium chloride SA (KLOR-CON) 20 MEQ tablet Take 2 tablets (40 mEq total) by mouth 2 (two) times daily. And additional tablet when taking metolazone 10/03/19  Yes Hackney, Otila Kluver A, FNP  rivaroxaban (XARELTO) 20 MG TABS tablet Take 20 mg by mouth daily with supper.   Yes [provider]  bisacodyl (BISACODYL) 5 MG EC tablet Take 2 tablets (10mg ) by mouth the day before your procedure between 1pm-3pm. 10/09/19   Virgel Manifold, MD  colchicine 0.6 MG tablet Take 1 tablet (0.6 mg total) by mouth daily. As directed 09/13/19 03/11/20  Milinda Pointer, MD  docusate sodium (COLACE) 100 MG capsule Take 200 mg by mouth daily.    [provider]  Fluticasone-Umeclidin-Vilant (TRELEGY ELLIPTA) 100-62.5-25 MCG/INH AEPB Inhale 1 puff into the lungs every morning. 08/24/19 11/22/19  Trinna Post, PA-C  guaiFENesin (MUCINEX) 600 MG 12 hr tablet Take 1 tablet (600 mg total) by mouth 2 (two) times daily. Patient not taking: Reported on 11/06/2019 11/19/18   Salary, Holly Bodily D, MD  ipratropium-albuterol (DUONEB) 0.5-2.5 (3) MG/3ML SOLN INHALE THE CONTENTS OF 1 VIAL(3MLS) VIA NEBULIZER 3 TIMES DAILY AS NEEDED 10/24/19   Trinna Post, PA-C  lactulose (CHRONULAC) 10 GM/15ML solution Take 45 mLs (30 g total) by mouth 2 (two) times daily as needed for mild constipation. Patient not taking: Reported on 11/06/2019 07/21/18   Epifanio Lesches, MD  meclizine (ANTIVERT) 12.5 MG tablet TAKE 2 TABLETS BY MOUTH 3 TIMES DAILY ASNEEDED FOR DIZZINESS 10/24/19   Trinna Post, PA-C  metolazone (ZAROXOLYN) 2.5 MG tablet Take 2.5 mg by mouth. Twice weekly    [provider]  nitroGLYCERIN (NITROSTAT) 0.4 MG SL tablet Place 1 tablet (0.4 mg total) under the tongue every 5 (five) minutes as needed for chest pain. 11/05/18   Bettey Costa, MD  pregabalin  (LYRICA) 25 MG capsule 25 mg 2 (two) times daily.  08/03/19   [provider]  promethazine (PHENERGAN) 12.5 MG tablet TAKE 1 TABLET BY MOUTH EVERY 6 HOURS AS NEEDED FOR NAUSEA OR VOMITING 10/24/19   Trinna Post, PA-C    Allergies as of 10/10/2019 - Review Complete 10/09/2019  Allergen Reaction Noted  . Amiodarone Nausea And Vomiting 07/17/2018  . Aspirin Swelling 06/08/2015  . Flexeril [cyclobenzaprine] Swelling 07/05/2015  . Trazamine [trazodone & diet manage prod] Nausea And Vomiting 08/19/2016  . Codeine Rash 06/08/2015  . Tramadol Rash 06/08/2015    Family History  Problem Relation Age of Onset  . COPD Mother   . Cancer Mother        Bone  . Asthma Mother   . Rheum arthritis Mother   . Congestive Heart Failure Father   . Breast cancer Maternal Aunt     Social History   Socioeconomic History  . Marital status: Married  Spouse name: Not on file  . Number of children: Not on file  . Years of education: Not on file  . Highest education level: Not on file  Occupational History  . Occupation: disabled  Social Needs  . Financial resource strain: Not hard at all  . Food insecurity    Worry: Never true    Inability: Never true  . Transportation needs    Medical: No    Non-medical: No  Tobacco Use  . Smoking status: Current Every Day Smoker    Packs/day: 0.10    Types: Cigarettes  . Smokeless tobacco: Never Used  . Tobacco comment: 4/20 was not able to quit.  Still at 1-3 a day. She would like to wait to set a new quit date until we get back to class to have the extra in person support  Substance and Sexual Activity  . Alcohol use: Never    Alcohol/week: 3.0 standard drinks    Types: 3 Cans of beer per week    Frequency: Never    Comment: 16 oz per week  . 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 Sexual activity: Not Currently  Lifestyle  . Physical activity    Days per week: 7 days    Minutes  per session: 20 min  . Stress: Not at all  Relationships  . Social connections    Talks on phone: More than three times a week    Gets together: More than three times a week    Attends religious service: 1 to 4 times per year    Active member of club or organization: Yes    Attends meetings of clubs or organizations: 1 to 4 times per year    Relationship status: Never married  . Intimate partner violence    Fear of current or ex partner: No    Emotionally abused: No    Physically abused: No    Forced sexual activity: No  Other Topics Concern  . Not on file  Social History Narrative  . Not on file    Review of Systems: See HPI, otherwise negative ROS  Physical Exam: BP 111/72   Pulse 72   Temp (!) 97.2 F (36.2 C) (Temporal)   Resp 16   Ht 5\' 4"  (1.626 m)   Wt 131.5 kg   LMP 11/08/2009 Comment: menopause  SpO2 99%   BMI 49.78 kg/m  General:   Alert,  pleasant and cooperative in NAD Head:  Normocephalic and atraumatic. Neck:  Supple; no masses or thyromegaly. Lungs:  Clear throughout to auscultation, normal respiratory effort.    Heart:  +S1, +S2, Regular rate and rhythm, No edema. Abdomen:  Soft, nontender and nondistended. Normal bowel sounds, without guarding, and without rebound.   Neurologic:  Alert and  oriented x4;  grossly normal neurologically.  Impression/Plan: Chelsea Ramsey is here for a colonoscopy to be performed for average risk screening and EGD for Acid Reflux and abdominal pain.  Risks, benefits, limitations, and alternatives regarding the procedures have been reviewed with the patient.  Questions have been answered.  All parties agreeable.   Virgel Manifold, MD  11/06/2019, 11:20 AM

## 2019-11-06 NOTE — Op Note (Signed)
[facility] Patient Name: Chelsea Ramsey Procedure Date: 11/06/2019 12:11 PM MRN: [id number] Account #: [id number] Date of Birth: [birth year] Admit Type: Outpatient Age: 53 Room: [facility] ENDO ROOM 3 Gender: Female Note Status: Finalized Procedure:             Upper GI endoscopy Indications:           Epigastric abdominal pain, Dysphagia Providers:             Corrin Hingle B. Bonna Gains MD, MD Referring MD:          Wendee Beavers. Terrilee Croak (Referring MD) Medicines:             Monitored Anesthesia Care Complications:         No immediate complications. Procedure:             Pre-Anesthesia Assessment:                        - Prior to the procedure, a History and Physical was                         performed, and patient medications, allergies and                         sensitivities were reviewed. The patient's tolerance                         of previous anesthesia was reviewed.                        - The risks and benefits of the procedure and the                         sedation options and risks were discussed with the                         patient. All questions were answered and informed                         consent was obtained.                        - Patient identification and proposed procedure were                         verified prior to the procedure by the physician, the                         nurse, the anesthesiologist, the anesthetist and the                         technician. The procedure was verified in the                         procedure room.                        - ASA Grade Assessment: II - A patient with mild                         systemic  disease.                        After obtaining informed consent, the endoscope was                         passed under direct vision. Throughout the procedure,                         the patient's blood pressure, pulse, and oxygen                         saturations were  monitored continuously. The Endoscope                         was introduced through the mouth, and advanced to the                         second part of duodenum. The upper GI endoscopy was                         accomplished with ease. The patient tolerated the                         procedure well. Findings:      Patchy mild mucosal changes characterized by sloughing were found in the       mid esophagus. Biopsies were taken with a cold forceps for histology.       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      There is no endoscopic evidence of stenosis or stricture in the entire       esophagus. Biopsies were obtained from the proximal and distal esophagus       with cold forceps for histology of suspected eosinophilic esophagitis.      The exam of the esophagus was otherwise normal.      Patchy mildly erythematous mucosa without bleeding was found in the       gastric antrum. Biopsies were taken with a cold forceps for histology.       Biopsies were obtained in the gastric body, at the incisura and in the       gastric antrum with cold forceps for histology.      The duodenal bulb, second portion of the duodenum and examined duodenum       were normal. Impression:            - Mucosal changes in the esophagus. Biopsied.                        - Erythematous mucosa in the antrum. Biopsied.                        - Normal duodenal bulb, second portion of the duodenum                         and examined duodenum.                        - Biopsies were obtained in the gastric body, at the  incisura and in the gastric antrum. Recommendation:        - Await pathology results.                        - Discharge patient to home (with escort).                        - Advance diet as tolerated.                        - Continue present medications.                        - Patient has a contact number available for                          emergencies. The signs and symptoms of potential                         delayed complications were discussed with the patient.                         Return to normal activities tomorrow. Written                         discharge instructions were provided to the patient.                        - Discharge patient to home (with escort).                        - The findings and recommendations were discussed with                         the patient.                        - The findings and recommendations were discussed with                         the patient's family. Procedure Code(s):     --- Professional ---                        [phone], Esophagogastroduodenoscopy, flexible,                         transoral; with biopsy, single or multiple Diagnosis Code(s):     --- Professional ---                        K22.8, Other specified diseases of esophagus                        K31.89, Other diseases of stomach and duodenum                        R10.13, Epigastric pain                        R13.10, Dysphagia, unspecified CPT copyright 2019 American Medical Association. All rights reserved. The codes documented  in this report are preliminary and upon coder review may  be revised to meet current compliance requirements.  Vonda Antigua, MD Margretta Sidle B. Bonna Gains MD, MD 11/06/2019 12:35:06 PM This report has been signed electronically. Number of Addenda: 0 Note Initiated On: 11/06/2019 12:11 PM Estimated Blood Loss:  Estimated blood loss: none.      [facility]

## 2019-11-07 ENCOUNTER — Other Ambulatory Visit: Payer: Self-pay | Admitting: Physician Assistant

## 2019-11-07 ENCOUNTER — Encounter: Payer: Self-pay | Admitting: Gastroenterology

## 2019-11-07 DIAGNOSIS — R42 Dizziness and giddiness: Secondary | ICD-10-CM

## 2019-11-07 LAB — SURGICAL PATHOLOGY

## 2019-11-07 NOTE — Anesthesia Postprocedure Evaluation (Signed)
Anesthesia Post Note  Patient: Chelsea Ramsey  Procedure(s) Performed: COLONOSCOPY WITH PROPOFOL (N/A ) ESOPHAGOGASTRODUODENOSCOPY (EGD) WITH PROPOFOL (N/A )  Patient location during evaluation: PACU Anesthesia Type: General Level of consciousness: awake and alert Pain management: pain level controlled Vital Signs Assessment: post-procedure vital signs reviewed and stable Respiratory status: spontaneous breathing, nonlabored ventilation and respiratory function stable Cardiovascular status: blood pressure returned to baseline and stable Postop Assessment: no apparent nausea or vomiting Anesthetic complications: no     Last Vitals:  Vitals:   11/06/19 1325 11/06/19 1335  BP: (!) 85/51 (!) 91/51  Pulse: 71 71  Resp: 15 11  Temp:    SpO2: 100% 100%    Last Pain:  Vitals:   11/07/19 0729  TempSrc:   PainSc: 0-No pain                 Durenda Hurt

## 2019-11-07 NOTE — Telephone Encounter (Signed)
Pt stated she is out of her  Meclizine HCl 12.5 MG and would like to know if it can be refilled today as she is having dizzyness. She had a colonoscopy on yesterday. Please advise.

## 2019-11-10 ENCOUNTER — Other Ambulatory Visit: Payer: Self-pay | Admitting: Physician Assistant

## 2019-11-10 DIAGNOSIS — K219 Gastro-esophageal reflux disease without esophagitis: Secondary | ICD-10-CM

## 2019-11-10 DIAGNOSIS — R42 Dizziness and giddiness: Secondary | ICD-10-CM

## 2019-11-10 IMAGING — CR DG ANKLE COMPLETE 3+V*R*
1 series · 3 of 3 positions shown · non-contrast
Comparison: None.

CLINICAL DATA: RIGHT ankle pain for a week, unable to walk. History
of RIGHT knee arthroplasty.

EXAM:
RIGHT ANKLE - COMPLETE 3+ VIEW

[Series 1: dg ankle complete right · 0.14mm/px · 3 of 3 slices shown]
[im 1/3]
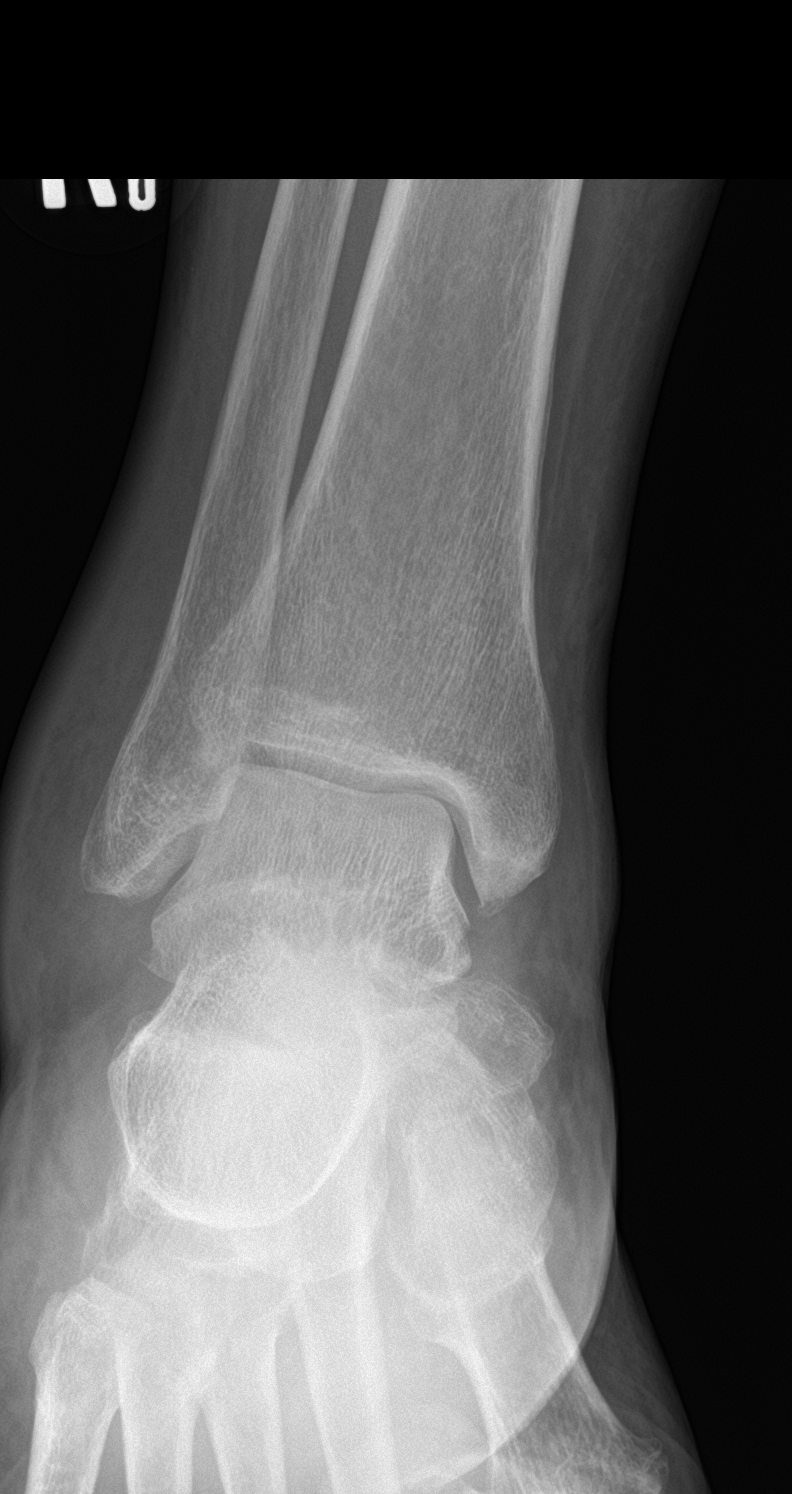
[im 2/3]
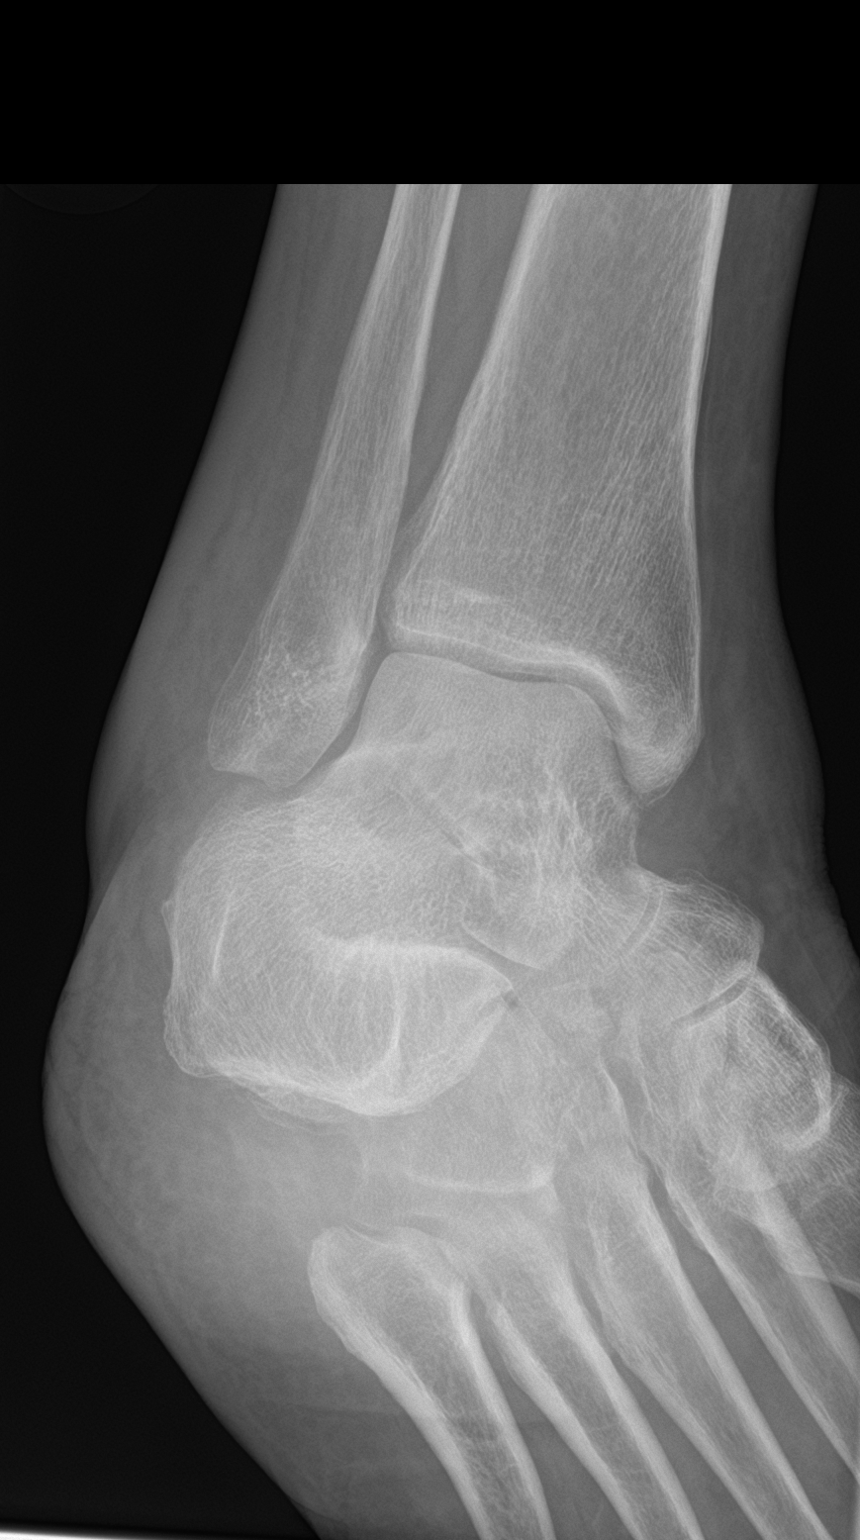
[im 3/3]
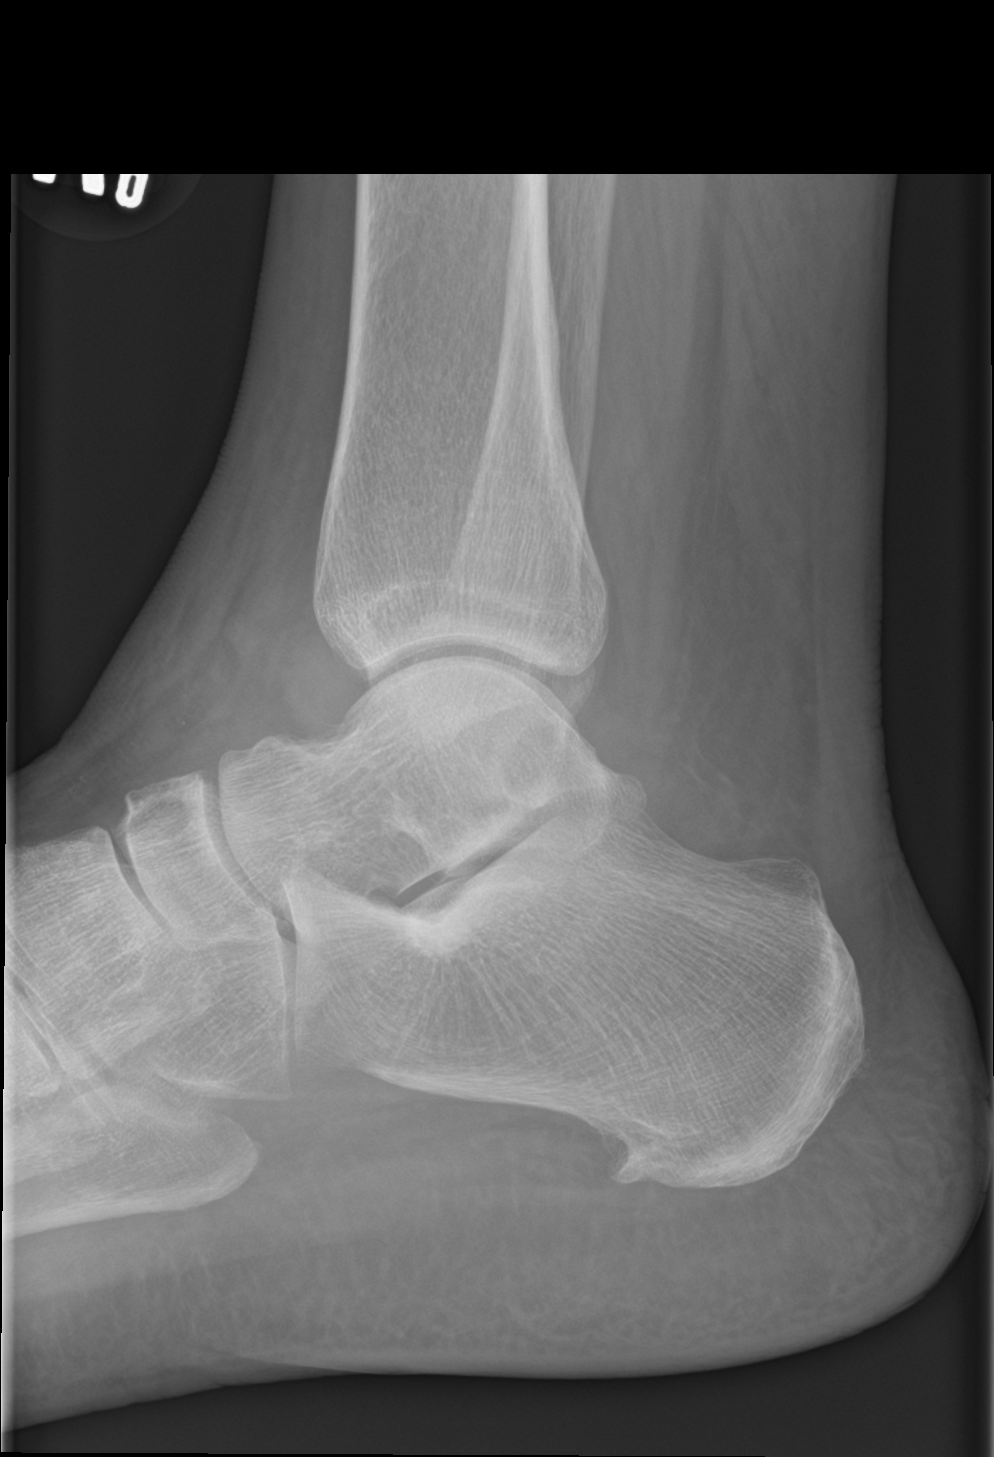

[3 of 3 positions shown; findings below may reference images not displayed]

FINDINGS: No fracture deformity nor dislocation. The ankle mortise appears
congruent and the tibiofibular syndesmosis intact. Small plantar
calcaneal spur. No destructive bony lesions. Soft tissue swelling
without subcutaneous gas or radiopaque foreign bodies. Small joint
effusion most apparent within the anterior tibiotalar space.
IMPRESSION: 1. Soft tissue swelling without acute osseous process.

## 2019-11-11 NOTE — Telephone Encounter (Signed)
Requested Prescriptions  Pending Prescriptions Disp Refills  . omeprazole (PRILOSEC) 20 MG capsule [Pharmacy Med Name: OMEPRAZOLE DR 20 MG CAP] 120 capsule 0    Sig: TAKE 2 TABLETS BY MOUTH TWICE A DAY BEFORE A MEAL     Gastroenterology: Proton Pump Inhibitors Passed - 11/10/2019  5:13 PM      Passed - Valid encounter within last 12 months    Recent Outpatient Visits          1 month ago Annual physical exam   [facility] [place] Carles Collet M, PA-C   2 months ago Chronic obstructive pulmonary disease, unspecified COPD type [facility])   [facility] [place], Wendee Beavers, PA-C      Future Appointments            In 1 month Milinda Pointer, MD [place]   In 1 month Tahiliani, Lennette Bihari, MD [place]   In 4 months [place], Wendee Beavers, PA-C [employer], [facility]           Refused Prescriptions Disp Refills  . meclizine (ANTIVERT) 12.5 MG tablet [Pharmacy Med Name: MECLIZINE HCL 12.5 MG TAB] 30 tablet 0    Sig: TAKE 2 TABLETS (25 MG TOTAL) BY MOUTH 3 TIMES DAILY AS NEEDED FOR DIZZINESS     Not Delegated - Gastroenterology: Antiemetics Failed - 11/10/2019  5:13 PM      Failed - This refill cannot be delegated      Passed - Valid encounter within last 6 months    Recent Outpatient Visits          1 month ago Annual physical exam   [facility] Carles Collet M, PA-C   2 months ago Chronic obstructive pulmonary disease, unspecified COPD type [facility])   [place], Wendee Beavers, PA-C      Future Appointments            In 1 month Milinda Pointer, MD [place]   In 1 month Tahiliani, Lennette Bihari, MD [place]   In 4 months Terrilee Croak, Wendee Beavers, [place], [facility]

## 2019-11-12 NOTE — Progress Notes (Deleted)
Patient ID: Chelsea Ramsey, female    DOB: 10-Dec-1965, 53 y.o.   MRN: [id number]   Chelsea Ramsey is a 53 y/o female with a history of CAD (leaky valve), HTN, pulmonary HTN, COPD, GERD, obstructive sleep apnea, previous tobacco use and chronic heart failure.   Echo reviewed from 09/06/19 and shows an EF of 50% along with a severely enlarged left atria. Echo report from 11/05/18 reviewed and showed an EF of 50-55% along with mild/moderate AR, mild MR,moderate TR and prosthetic mitral valve. Echo report from 08/11/18 reviewed and showed an EF of 40-45% which has declined from previous echo. + moderate TR and mild/moderately elevated PA pressure.  Echo report from 07/20/18 reviewed and showed an EF of 50-55% along with mild MR and an elevated PA pressure of 50 mm Hg.   No admission or ED visits in the last 6 months.   She presents today for a follow-up visit with a chief complaint of   Past Medical History:  Diagnosis Date  . Acute drug-induced gout of left foot 03/01/2018   Last Assessment & Plan:  Likely at least partially brought on by diuresis.  Needs to continue to diurese  Will push hydration Stop allopurinol given initiation during acute flare may worsen this, re-broach this when asymptomatic Avoid nsaids given stomach pain Trial colchicine Add acetaminophen Ice, elevate, rest  . Allergy    seasonal  . Anxiety   . Arthritis    Right Knee  . Asthma   . CHF (congestive heart failure) ([place])   . COPD (chronic obstructive pulmonary disease) ([place])   . Coronary artery disease    Leaky heart valve  . Dysrhythmia   . Fibromyalgia   . GERD (gastroesophageal reflux disease)   . Hypertension   . Pneumonia   . PUD (peptic ulcer disease)   . Pulmonary HTN ([place])   . Rheumatic fever/heart disease   . Sleep apnea    Past Surgical History:  Procedure Laterality Date  . ADENOIDECTOMY    . COLONOSCOPY WITH PROPOFOL N/A 11/06/2019   Procedure: COLONOSCOPY WITH PROPOFOL;  Surgeon: Virgel Manifold, MD;  Location: [facility] ENDOSCOPY;  Service: Endoscopy;  Laterality: N/A;  2 day prep   . CORONARY ARTERY BYPASS GRAFT     June 27 2018  . ESOPHAGOGASTRODUODENOSCOPY (EGD) WITH PROPOFOL N/A 05/25/2018   Procedure: ESOPHAGOGASTRODUODENOSCOPY (EGD) WITH PROPOFOL;  Surgeon: Lucilla Lame, MD;  Location: [facility] ENDOSCOPY;  Service: Endoscopy;  Laterality: N/A;  . ESOPHAGOGASTRODUODENOSCOPY (EGD) WITH PROPOFOL N/A 11/06/2019   Procedure: ESOPHAGOGASTRODUODENOSCOPY (EGD) WITH PROPOFOL;  Surgeon: Virgel Manifold, MD;  Location: [facility] ENDOSCOPY;  Service: Endoscopy;  Laterality: N/A;  . MITRAL VALVE REPLACEMENT    . MULTIPLE TOOTH EXTRACTIONS    . TONSILLECTOMY    . TOTAL KNEE ARTHROPLASTY Right 09/04/2016   Procedure: TOTAL KNEE ARTHROPLASTY; with lateral release;  Surgeon: Earlie Server, MD;  Location: [place];  Service: Orthopedics;  Laterality: Right;   Family History  Problem Relation Age of Onset  . COPD Mother   . Cancer Mother        Bone  . Asthma Mother   . Rheum arthritis Mother   . Congestive Heart Failure Father   . Breast cancer Maternal Aunt    Social History   Tobacco Use  . Smoking status: Current Every Day Smoker    Packs/day: 0.10    Types: Cigarettes  . Smokeless tobacco: Never Used  . Tobacco comment: 4/20 was not able to quit.  Still at 1-3 a day. She would like to wait to set a new quit date until we get back to class to have the extra in person support  Substance Use Topics  . Alcohol use: Never    Alcohol/week: 3.0 standard drinks    Types: 3 Cans of beer per week    Comment: 16 oz per week   Allergies  Allergen Reactions  . Amiodarone Nausea And Vomiting  . Aspirin Swelling  . Flexeril [Cyclobenzaprine] Swelling  . Trazamine [Trazodone & Diet Manage Prod] Nausea And Vomiting  . Codeine Rash  . Tramadol Rash     Review of Systems  Constitutional: Positive for fatigue (minimal). Negative for appetite change.  HENT: Negative for congestion,  postnasal drip and sore throat.   Eyes: Positive for visual disturbance (blurry vision at times).  Respiratory: Positive for shortness of breath (with moderate exertion). Negative for cough and wheezing.   Cardiovascular: Negative for chest pain, palpitations and leg swelling.  Gastrointestinal: Positive for abdominal distention (comes and goes). Negative for abdominal pain and nausea.  Endocrine: Negative.   Genitourinary: Negative.   Musculoskeletal: Negative for arthralgias, back pain and neck pain.  Skin: Negative.   Allergic/Immunologic: Negative.   Neurological: Positive for dizziness ("vertigo"). Negative for light-headedness.  Hematological: Negative for adenopathy. Does not bruise/bleed easily.  Psychiatric/Behavioral: Positive for sleep disturbance (oxygen at 4L at bedtime, 4 pillows). Negative for dysphoric mood. The patient is not nervous/anxious.      Physical Exam Vitals and nursing note reviewed.  Constitutional:      Appearance: She is well-developed.  HENT:     Head: Normocephalic and atraumatic.  Neck:     Vascular: No JVD.  Cardiovascular:     Rate and Rhythm: Normal rate and regular rhythm.  Pulmonary:     Effort: Pulmonary effort is normal. No respiratory distress.     Breath sounds: Wheezing present. No rales.  Abdominal:     General: There is no distension.     Palpations: Abdomen is soft.  Musculoskeletal:     Cervical back: Normal range of motion and neck supple.     Right lower leg: No tenderness. No edema.     Left lower leg: No tenderness. No edema.  Skin:    General: Skin is warm and dry.  Neurological:     Mental Status: She is alert and oriented to person, place, and time.     Sensory: Sensation is intact.     Motor: Motor function is intact.  Psychiatric:        Mood and Affect: Mood is not anxious.        Behavior: Behavior normal.    Assessment & Plan:  1: Chronic heart failure with preserved ejection fraction- - NYHA class III -  euvolemic today - weighing daily and she was instructed to call for an overnight weight gain of >2 pounds or a weekly weight gain of >5 pounds - weight 292.9 pounds since last visit here 6 weeks ago - not adding salt and has been reading food labels. Reviewed the importance of closely following a 2000mg  sodium diet  - encouraged her to limit her fluid intake to 64 ounces daily. - BNP 12/01/2018 was 259.0 - saw cardiology (Paraschos) 09/13/2019 -   2: HTN- - BP  - saw PCP Terrilee Croak) 09/27/2019  - BMP 11/2//2020 reviewed and showed sodium 138, potassium 3.1, creatinine 0.67 and GFR >60  3: Atrial fibrillation- - had mitral valve replaced July 2019 -  cardioverted 06/30/18  Patient did not bring her medications nor a list. Each medication was verbally reviewed with the patient and she was encouraged to bring the bottles to every visit to confirm accuracy of list.

## 2019-11-13 ENCOUNTER — Ambulatory Visit: Payer: Medicaid Other | Admitting: Family

## 2019-11-16 ENCOUNTER — Other Ambulatory Visit: Payer: Self-pay | Admitting: Physician Assistant

## 2019-11-16 DIAGNOSIS — R42 Dizziness and giddiness: Secondary | ICD-10-CM

## 2019-11-16 NOTE — Telephone Encounter (Signed)
PT WOULD LIKE A LARGER QUANTITY. MAYBE #90? THANKS

## 2019-11-17 NOTE — Telephone Encounter (Signed)
Patient only got 30 on 11/08/19 and Rx reads 2 up to TID Can she get a larger Qty?

## 2019-11-17 NOTE — Telephone Encounter (Signed)
We need to make sure that she is seeing ENT, she should have had appointment already.

## 2019-11-20 NOTE — Telephone Encounter (Signed)
If that is the case her future refills should come from ENT so there is not confusion or overlapping with medications and treatment plans.

## 2019-11-20 NOTE — Telephone Encounter (Signed)
Patient states that she has already been to see ENT and go back on 11/29/2019.FYI

## 2019-11-27 ENCOUNTER — Other Ambulatory Visit: Payer: Self-pay | Admitting: Physician Assistant

## 2019-11-27 ENCOUNTER — Other Ambulatory Visit: Payer: Self-pay | Admitting: Family

## 2019-11-27 DIAGNOSIS — R42 Dizziness and giddiness: Secondary | ICD-10-CM

## 2019-11-27 MED ORDER — FUROSEMIDE 40 MG PO TABS: ORAL_TABLET | ORAL | 5 refills | Status: DC

## 2019-11-27 NOTE — Telephone Encounter (Signed)
Patient advised.

## 2019-11-27 NOTE — Telephone Encounter (Signed)
This will be the last refill. Patient should be getting this addressed by ENT.

## 2019-11-27 NOTE — Telephone Encounter (Signed)
Requested medication (s) are due for refill today:yes  Requested medication (s) are on the active medication list: yes  Last refill: 10/24/2019  #30  0 refills  Future visit scheduled  yes 03/27/20  Notes to clinic:Not delegated  Requested Prescriptions  Pending Prescriptions Disp Refills   promethazine (PHENERGAN) 12.5 MG tablet [Pharmacy Med Name: PROMETHAZINE HCL 12.5 MG TAB] 30 tablet 0    Sig: TAKE 1 TABLET BY MOUTH EVERY 6 HOURS AS NEEDED FOR NAUSEA OR VOMITING      Not Delegated - Gastroenterology: Antiemetics Failed - 11/27/2019 10:39 AM      Failed - This refill cannot be delegated      Passed - Valid encounter within last 6 months    Recent Outpatient Visits           2 months ago Annual physical exam   [place], [place], PA-C   3 months ago Chronic obstructive pulmonary disease, unspecified COPD type [facility])   [place], Wendee Beavers, PA-C       Future Appointments             In 2 weeks Milinda Pointer, MD [place]   In 2 weeks Virgel Manifold, MD [place]   In 4 months Terrilee Croak, Wendee Beavers, [place], [facility]

## 2019-11-28 NOTE — Progress Notes (Deleted)
Patient ID: Chelsea Ramsey, female    DOB: Feb 14, 1966, 53 y.o.   MRN: [id number]   Chelsea Ramsey is a 53 y/o female with a history of CAD (leaky valve), HTN, pulmonary HTN, COPD, GERD, obstructive sleep apnea, previous tobacco use and chronic heart failure.   Echo reviewed from 09/06/19 and shows an EF of 50% along with a severely enlarged left atria. Echo report from 11/05/18 reviewed and showed an EF of 50-55% along with mild/moderate AR, mild MR,moderate TR and prosthetic mitral valve. Echo report from 08/11/18 reviewed and showed an EF of 40-45% which has declined from previous echo. + moderate TR and mild/moderately elevated PA pressure.  Echo report from 07/20/18 reviewed and showed an EF of 50-55% along with mild MR and an elevated PA pressure of 50 mm Hg.   No admission or ED visits in the last 6 months.   She presents today for a follow-up visit with a chief complaint of   Past Medical History:  Diagnosis Date  . Acute drug-induced gout of left foot 03/01/2018   Last Assessment & Plan:  Likely at least partially brought on by diuresis.  Needs to continue to diurese  Will push hydration Stop allopurinol given initiation during acute flare may worsen this, re-broach this when asymptomatic Avoid nsaids given stomach pain Trial colchicine Add acetaminophen Ice, elevate, rest  . Allergy    seasonal  . Anxiety   . Arthritis    Right Knee  . Asthma   . CHF (congestive heart failure) ([employer])   . COPD (chronic obstructive pulmonary disease) ([place])   . Coronary artery disease    Leaky heart valve  . Dysrhythmia   . Fibromyalgia   . GERD (gastroesophageal reflux disease)   . Hypertension   . Pneumonia   . PUD (peptic ulcer disease)   . Pulmonary HTN ([place])   . Rheumatic fever/heart disease   . Sleep apnea    Past Surgical History:  Procedure Laterality Date  . ADENOIDECTOMY    . COLONOSCOPY WITH PROPOFOL N/A 11/06/2019   Procedure: COLONOSCOPY WITH PROPOFOL;  Surgeon: Virgel Manifold, MD;  Location: [facility] ENDOSCOPY;  Service: Endoscopy;  Laterality: N/A;  2 day prep   . CORONARY ARTERY BYPASS GRAFT     June 27 2018  . ESOPHAGOGASTRODUODENOSCOPY (EGD) WITH PROPOFOL N/A 05/25/2018   Procedure: ESOPHAGOGASTRODUODENOSCOPY (EGD) WITH PROPOFOL;  Surgeon: Lucilla Lame, MD;  [facility] ENDOSCOPY;  Service: Endoscopy;  Laterality: N/A;  . ESOPHAGOGASTRODUODENOSCOPY (EGD) WITH PROPOFOL N/A 11/06/2019   Procedure: ESOPHAGOGASTRODUODENOSCOPY (EGD) WITH PROPOFOL;  Surgeon: Virgel Manifold, MD;  Location: [facility] ENDOSCOPY;  Service: Endoscopy;  Laterality: N/A;  . MITRAL VALVE REPLACEMENT    . MULTIPLE TOOTH EXTRACTIONS    . TONSILLECTOMY    . TOTAL KNEE ARTHROPLASTY Right 09/04/2016   Procedure: TOTAL KNEE ARTHROPLASTY; with lateral release;  Surgeon: Earlie Server, MD;  Location: [place];  Service: Orthopedics;  Laterality: Right;   Family History  Problem Relation Age of Onset  . COPD Mother   . Cancer Mother        Bone  . Asthma Mother   . Rheum arthritis Mother   . Congestive Heart Failure Father   . Breast cancer Maternal Aunt    Social History   Tobacco Use  . Smoking status: Current Every Day Smoker    Packs/day: 0.10    Types: Cigarettes  . Smokeless tobacco: Never Used  . Tobacco comment: 4/20 was not able to quit.  Still at 1-3 a day. She would like to wait to set a new quit date until we get back to class to have the extra in person support  Substance Use Topics  . Alcohol use: Never    Alcohol/week: 3.0 standard drinks    Types: 3 Cans of beer per week    Comment: 16 oz per week   Allergies  Allergen Reactions  . Amiodarone Nausea And Vomiting  . Aspirin Swelling  . Flexeril [Cyclobenzaprine] Swelling  . Trazamine [Trazodone & Diet Manage Prod] Nausea And Vomiting  . Codeine Rash  . Tramadol Rash     Review of Systems  Constitutional: Positive for fatigue (minimal). Negative for appetite change.  HENT: Negative for congestion,  postnasal drip and sore throat.   Eyes: Positive for visual disturbance (blurry vision at times).  Respiratory: Positive for shortness of breath (with moderate exertion). Negative for cough and wheezing.   Cardiovascular: Negative for chest pain, palpitations and leg swelling.  Gastrointestinal: Positive for abdominal distention (comes and goes). Negative for abdominal pain and nausea.  Endocrine: Negative.   Genitourinary: Negative.   Musculoskeletal: Negative for arthralgias, back pain and neck pain.  Skin: Negative.   Allergic/Immunologic: Negative.   Neurological: Positive for dizziness ("vertigo"). Negative for light-headedness.  Hematological: Negative for adenopathy. Does not bruise/bleed easily.  Psychiatric/Behavioral: Positive for sleep disturbance (oxygen at 4L at bedtime, 4 pillows). Negative for dysphoric mood. The patient is not nervous/anxious.      Physical Exam Vitals and nursing note reviewed.  Constitutional:      Appearance: She is well-developed.  HENT:     Head: Normocephalic and atraumatic.  Neck:     Vascular: No JVD.  Cardiovascular:     Rate and Rhythm: Normal rate and regular rhythm.  Pulmonary:     Effort: Pulmonary effort is normal. No respiratory distress.     Breath sounds: Wheezing present. No rales.  Abdominal:     General: There is no distension.     Palpations: Abdomen is soft.  Musculoskeletal:     Cervical back: Normal range of motion and neck supple.     Right lower leg: No tenderness. No edema.     Left lower leg: No tenderness. No edema.  Skin:    General: Skin is warm and dry.  Neurological:     Mental Status: She is alert and oriented to person, place, and time.     Sensory: Sensation is intact.     Motor: Motor function is intact.  Psychiatric:        Mood and Affect: Mood is not anxious.        Behavior: Behavior normal.    Assessment & Plan:  1: Chronic heart failure with preserved ejection fraction- - NYHA class III -  euvolemic today - weighing daily and she was instructed to call for an overnight weight gain of >2 pounds or a weekly weight gain of >5 pounds - weight 292.9 pounds since last visit here 8 weeks ago - not adding salt and has been reading food labels. Reviewed the importance of closely following a 2000mg  sodium diet  - encouraged her to limit her fluid intake to 64 ounces daily. - BNP 12/01/2018 was 259.0 - saw cardiology (Paraschos) 09/13/2019 -   2: HTN- - BP  - saw PCP Terrilee Croak) 09/27/2019  - BMP 11/2//2020 reviewed and showed sodium 138, potassium 3.1, creatinine 0.67 and GFR >60  3: Atrial fibrillation- - had mitral valve replaced July 2019 -  cardioverted 06/30/18  Patient did not bring her medications nor a list. Each medication was verbally reviewed with the patient and she was encouraged to bring the bottles to every visit to confirm accuracy of list.

## 2019-11-29 ENCOUNTER — Ambulatory Visit: Payer: Medicaid Other | Admitting: Family

## 2019-12-04 ENCOUNTER — Encounter: Payer: Self-pay | Admitting: Gastroenterology

## 2019-12-09 NOTE — Progress Notes (Signed)
Virtual Visit via Telephone Note    Evaluation Performed:  Follow-up visit  This visit type was conducted due to national recommendations for restrictions regarding the COVID-19 Pandemic (e.g. social distancing).  This format is felt to be most appropriate for this patient at this time.  All issues noted in this document were discussed and addressed.  No physical exam was performed (except for noted visual exam findings with Video Visits).  Please refer to the patient's chart (MyChart message for video visits and phone note for telephone visits) for the patient's consent to telehealth for [facility]  Date:  12/11/2019   ID:  Chelsea Ramsey, DOB 01/22/1966, MRN [id number]  Patient Location:  [address] [place] [address]   Provider location:   [facility] [place] [address], [place] [address]  PCP:  Trinna Post, PA-C  Cardiologist: Isaias Cowman, MD Electrophysiologist:  None   Chief Complaint: shortness of breath  History of Present Illness:    Chelsea Ramsey is a 54 y.o. female who presents via audio/video conferencing for a telehealth visit today.  Patient verified DOB and address.  The patient does not have symptoms concerning for COVID-19 infection (fever, chills, cough, or new SHORTNESS OF BREATH).   Patient reports chronic moderate shortness of breath with little exertion. She describes this as chronic in nature having been present for several years. She has associated dizziness, minimal pedal edema and fatigue along with this. She denies any palpitations, chest pain, wheezing, cough, difficulty sleeping or weight gain. Continues to wear her oxygen at 2-3L during the day and 4L at bedtime.    Prior CV studies:   The following studies were reviewed today:  Echo report from 11/05/18 reviewed and showed an EF of 50-55% along with moderate TR.   Past Medical History:  Diagnosis Date  . Acute  drug-induced gout of left foot 03/01/2018   Last Assessment & Plan:  Likely at least partially brought on by diuresis.  Needs to continue to diurese  Will push hydration Stop allopurinol given initiation during acute flare may worsen this, re-broach this when asymptomatic Avoid nsaids given stomach pain Trial colchicine Add acetaminophen Ice, elevate, rest  . Allergy    seasonal  . Anxiety   . Arthritis    Right Knee  . Asthma   . CHF (congestive heart failure) ([place])   . COPD (chronic obstructive pulmonary disease) ([place])   . Coronary artery disease    Leaky heart valve  . Dysrhythmia   . Fibromyalgia   . GERD (gastroesophageal reflux disease)   . Hypertension   . Pneumonia   . PUD (peptic ulcer disease)   . Pulmonary HTN ([place])   . Rheumatic fever/heart disease   . Sleep apnea    Past Surgical History:  Procedure Laterality Date  . ADENOIDECTOMY    . COLONOSCOPY WITH PROPOFOL N/A 11/06/2019   Procedure: COLONOSCOPY WITH PROPOFOL;  Surgeon: Virgel Manifold, MD;  Location: [facility] ENDOSCOPY;  Service: Endoscopy;  Laterality: N/A;  2 day prep   . CORONARY ARTERY BYPASS GRAFT     June 27 2018  . ESOPHAGOGASTRODUODENOSCOPY (EGD) WITH PROPOFOL N/A 05/25/2018   Procedure: ESOPHAGOGASTRODUODENOSCOPY (EGD) WITH PROPOFOL;  Surgeon: Lucilla Lame, MD;  Location: [facility] ENDOSCOPY;  Service: Endoscopy;  Laterality: N/A;  . ESOPHAGOGASTRODUODENOSCOPY (EGD) WITH PROPOFOL N/A 11/06/2019   Procedure: ESOPHAGOGASTRODUODENOSCOPY (EGD) WITH PROPOFOL;  Surgeon: Virgel Manifold, MD;  Location: [facility] ENDOSCOPY;  Service: Endoscopy;  Laterality: N/A;  . MITRAL VALVE REPLACEMENT    . MULTIPLE TOOTH EXTRACTIONS    . TONSILLECTOMY    . TOTAL KNEE ARTHROPLASTY Right 09/04/2016   Procedure: TOTAL KNEE ARTHROPLASTY; with lateral release;  Surgeon: Earlie Server, MD;  Location: [facility];  Service: Orthopedics;  Laterality: Right;    Prior to Admission medications   Medication Sig Start Date End Date Taking?  Authorizing Provider  albuterol (PROAIR HFA) 108 (90 Base) MCG/ACT inhaler Inhale 1-2 puffs into the lungs every 6 (six) hours as needed for wheezing or shortness of breath.   Yes [provider]  allopurinol (ZYLOPRIM) 100 MG tablet Take 1 tablet (100 mg total) by mouth 2 (two) times daily. 09/13/19 03/11/20 Yes Milinda Pointer, MD  bisacodyl (BISACODYL) 5 MG EC tablet Take 2 tablets (10mg ) by mouth the day before your procedure between 1pm-3pm. 10/09/19  Yes Tahiliani, Lennette Bihari, MD  butalbital-acetaminophen-caffeine (FIORICET, ESGIC) 50-325-40 MG tablet Take 1 tablet by mouth every 6 (six) hours as needed.    Yes [provider]  Calcium Carbonate-Vit D-Min (GNP CALCIUM 1200) 1200-1000 MG-UNIT CHEW Chew 1,200 mg by mouth daily with breakfast. Take in combination with vitamin D and magnesium. 09/13/19 09/12/20 Yes Milinda Pointer, MD  Cholecalciferol (D 5000) 125 MCG (5000 UT) capsule Take by mouth. 11/14/18  Yes [provider]  colchicine 0.6 MG tablet Take 1 tablet (0.6 mg total) by mouth daily. As directed 09/13/19 03/11/20 Yes Milinda Pointer, MD  diazepam (VALIUM) 2 MG tablet Take 2 mg by mouth every 8 (eight) hours as needed for anxiety.   Yes [provider]  docusate sodium (COLACE) 100 MG capsule Take 200 mg by mouth daily.   Yes [provider]  furosemide (LASIX) 40 MG tablet Take 2 tablets in the am and 1 tablet at night. 11/27/19  Yes Darylene Price A, FNP  gabapentin (NEURONTIN) 600 MG tablet Take 0.5 tablets (300 mg total) by mouth 2 (two) times daily. Patient taking differently: Take 600 mg by mouth 3 (three) times daily.  12/06/18  Yes Vaughan Basta, MD  guaiFENesin (MUCINEX) 600 MG 12 hr tablet Take 1 tablet (600 mg total) by mouth 2 (two) times daily. 11/19/18  Yes Salary, Montell D, MD  ipratropium-albuterol (DUONEB) 0.5-2.5 (3) MG/3ML SOLN INHALE THE CONTENTS OF 1 VIAL(3MLS) VIA NEBULIZER 3 TIMES DAILY AS NEEDED 10/24/19   Yes Pollak, Adriana M, PA-C  lactulose (CHRONULAC) 10 GM/15ML solution Take 45 mLs (30 g total) by mouth 2 (two) times daily as needed for mild constipation. 07/21/18  Yes Epifanio Lesches, MD  linaclotide [facility]) 145 MCG CAPS capsule Take 1 capsule (145 mcg total) by mouth daily before breakfast. 10/09/19 12/11/19 Yes Virgel Manifold, MD  meclizine (ANTIVERT) 12.5 MG tablet TAKE 2 TABLETS (25 MG TOTAL) BY MOUTH 3 TIMES DAILY AS NEEDED FOR DIZZINESS 11/17/19  Yes Carles Collet M, PA-C  metolazone (ZAROXOLYN) 2.5 MG tablet Take 2.5 mg by mouth. Three times weekly   Yes [provider]  metoprolol tartrate (LOPRESSOR) 100 MG tablet Take 1 tablet (100 mg total) by mouth 2 (two) times daily. 12/21/18  Yes Brittian Renaldo, Otila Kluver A, FNP  nitroGLYCERIN (NITROSTAT) 0.4 MG SL tablet Place 1 tablet (0.4 mg total) under the tongue every 5 (five) minutes as needed for chest pain. 11/05/18  Yes Mody, Ulice Bold, MD  nortriptyline (PAMELOR) 10 MG capsule Take 20 mg by mouth at bedtime.    Yes [provider]  omeprazole (PRILOSEC) 20 MG capsule TAKE 2 TABLETS  BY MOUTH TWICE A DAY BEFORE A MEAL 11/11/19  Yes Carles Collet M, PA-C  OXYGEN Inhale 4 L/L into the lungs.   Yes [provider]  potassium chloride SA (KLOR-CON) 20 MEQ tablet Take 2 tablets (40 mEq total) by mouth 2 (two) times daily. And additional tablet when taking metolazone 10/03/19  Yes Hawthorne Day A, FNP  pregabalin (LYRICA) 25 MG capsule 25 mg 2 (two) times daily.  08/03/19  Yes [provider]  promethazine (PHENERGAN) 12.5 MG tablet TAKE 1 TABLET BY MOUTH EVERY 6 HOURS AS NEEDED FOR NAUSEA OR VOMITING 11/27/19  Yes Carles Collet M, PA-C  rivaroxaban (XARELTO) 20 MG TABS tablet Take 20 mg by mouth daily with supper.   Yes [provider]  zolpidem (AMBIEN) 5 MG tablet Take 5 mg by mouth at bedtime as needed for sleep.   Yes [provider]  Oxycodone HCl 10 MG TABS Take 0.5-1 tablets (5-10 mg  total) by mouth 2 (two) times daily as needed. Must last 30 days. MAX.: 2/day 09/16/19 11/06/19  Milinda Pointer, MD      Allergies:   Amiodarone, Aspirin, Flexeril [cyclobenzaprine], Trazamine [trazodone & diet manage prod], Codeine, and Tramadol   Social History   Tobacco Use  . Smoking status: Current Every Day Smoker    Packs/day: 0.10    Types: Cigarettes  . Smokeless tobacco: Never Used  . Tobacco comment: 4/20 was not able to quit.  Still at 1-3 a day. She would like to wait to set a new quit date until we get back to class to have the extra in person support  Substance Use Topics  . Alcohol use: Never    Alcohol/week: 3.0 standard drinks    Types: 3 Cans of beer per week    Comment: 16 oz per week  . Drug use: Not Currently    Comment: Previous use of cocaine and marijuana last use 07/31/16      Family Hx: The patient's family history includes Asthma in her mother; Breast cancer in her maternal aunt; COPD in her mother; Cancer in her mother; Congestive Heart Failure in her father; Rheum arthritis in her mother.  ROS:   Please see the history of present illness.     All other systems reviewed and are negative.   Labs/Other Tests and Data Reviewed:    Recent Labs: 07/24/2019: B Natriuretic Peptide 104.0 08/24/2019: ALT 17; Hemoglobin 11.7; Platelets 298 10/02/2019: BUN 19; Creatinine, Ser 0.67; Potassium 3.1; Sodium 138   Recent Lipid Panel Lab Results  Component Value Date/Time   CHOL 96 04/03/2014 12:00 AM   CHOL 91 12/09/2013 04:27 AM   TRIG 63 04/03/2014 12:00 AM   TRIG 50 12/09/2013 04:27 AM   HDL 32 (A) 04/03/2014 12:00 AM   HDL 31 (L) 12/09/2013 04:27 AM   LDLCALC 51 04/03/2014 12:00 AM   LDLCALC 50 12/09/2013 04:27 AM    Wt Readings from Last 3 Encounters:  11/06/19 290 lb (131.5 kg)  10/09/19 291 lb (132 kg)  10/02/19 292 lb 9.6 oz (132.7 kg)     Exam:    Vital Signs:  LMP 11/08/2009 Comment: menopause   Well nourished, well developed female  in no  acute distress.   ASSESSMENT & PLAN:    1.  1: Chronic heart failure with preserved ejection fraction- - NYHA class III - euvolemic today per her report - weighing daily and she was reminded to call for an overnight weight gain of >2 pounds or  a weekly weight gain of >5 pounds - not adding salt and has been reading food labels. Reviewed the importance of closely following a 2000mg  sodium diet  - encouraged her to limit her fluid intake to 64 ounces daily. - BNP 12/01/2018 was 259.0 - saw cardiology ([facility]) 09/13/2019 - wearing oxygen at 2-3L during the day and 4L at bedtime  2: HTN- - self-reported BP was good - saw PCP Terrilee Croak) 09/27/2019  - BMP 11/2//2020 reviewed and showed sodium 138, potassium 3.1, creatinine 0.67 and GFR >60  3: Atrial fibrillation- - had mitral valve replaced July 2019 - cardioverted 06/30/18  4: Tobacco use- - smoking a few cigarettes daily - complete cessation discussed for 3 minutes with her  COVID-19 Education: The signs and symptoms of COVID-19 were discussed with the patient and how to seek care for testing (follow up with PCP or arrange E-visit).  The importance of social distancing was discussed today.  Patient Risk:   After full review of this patients clinical status, I feel that they are at least moderate risk at this time.  Time:   Today, I have spent 18 minutes with the patient with telehealth technology discussing medications, symptoms to report and activity     Medication Adjustments/Labs and Tests Ordered: Current medicines are reviewed at length with the patient today.  Concerns regarding medicines are outlined above.   Tests Ordered: No orders of the defined types were placed in this encounter.  Medication Changes: No orders of the defined types were placed in this encounter.   Disposition:  Return in 6 months or sooner for any questions/problems before then.   Signed, Alisa Graff, FNP  12/11/2019 2:20 PM    [facility]

## 2019-12-11 ENCOUNTER — Ambulatory Visit: Payer: Medicaid Other | Admitting: Family

## 2019-12-11 ENCOUNTER — Ambulatory Visit: Payer: Medicaid Other | Attending: Family | Admitting: Family

## 2019-12-11 ENCOUNTER — Telehealth: Payer: Self-pay | Admitting: Family

## 2019-12-11 ENCOUNTER — Other Ambulatory Visit: Payer: Self-pay

## 2019-12-11 VITALS — BP 128/85 | HR 93 | Wt 289.6 lb

## 2019-12-11 DIAGNOSIS — Z720 Tobacco use: Secondary | ICD-10-CM

## 2019-12-11 DIAGNOSIS — I10 Essential (primary) hypertension: Secondary | ICD-10-CM

## 2019-12-11 DIAGNOSIS — I480 Paroxysmal atrial fibrillation: Secondary | ICD-10-CM

## 2019-12-11 DIAGNOSIS — I5032 Chronic diastolic (congestive) heart failure: Secondary | ICD-10-CM

## 2019-12-11 NOTE — Telephone Encounter (Signed)
Virtual Visit Pre-Appointment Phone Call  Ms Peeler I am calling you today to discuss your upcoming appointment. We are currently trying to limit exposure to the virus that causes COVID-19 by seeing patients at home rather than in the office."  1. "What is the BEST phone number to call the day of the visit?" - include this in appointment notes  2. "Do you have or have access to (through a family member/friend) a smartphone with video capability that we can use for your visit?" a. If yes - list this number in appt notes as "cell" (if different from BEST phone #) and list the appointment type as a VIDEO visit in appointment notes b. If no - list the appointment type as a PHONE visit in appointment notes  3. Confirm consent - "In the setting of the current Covid19 crisis, you are scheduled for a (phone or video) visit with your provider on (date) at (time).  Just as we do with many in-office visits, in order for you to participate in this visit, we must obtain consent.  If you'd like, I can send this to your mychart (if signed up) or email for you to review.  Otherwise, I can obtain your verbal consent now.  All virtual visits are billed to your insurance company just like a normal visit would be.  By agreeing to a virtual visit, we'd like you to understand that the technology does not allow for your provider to perform an examination, and thus may limit your provider's ability to fully assess your condition. If your provider identifies any concerns that need to be evaluated in person, we will make arrangements to do so.  Finally, though the technology is pretty good, we cannot assure that it will always work on either your or our end, and in the setting of a video visit, we may have to convert it to a phone-only visit.  In either situation, we cannot ensure that we have a secure connection.  Are you willing to proceed?" STAFF: Did the patient verbally acknowledge consent to telehealth visit? Document  YES/NO here: YES  4. Advise patient to be prepared - "Two hours prior to your appointment, go ahead and check your blood pressure, pulse, oxygen saturation, and your weight (if you have the equipment to check those) and write them all down. When your visit starts, your provider will ask you for this information. If you have an Apple Watch or Kardia device, please plan to have heart rate information ready on the day of your appointment. Please have a pen and paper handy nearby the day of the visit as well."  5. Give patient instructions for MyChart download to smartphone OR [email or web address] as below if video visit (depending on what platform provider is using)  6. Inform patient they will receive a phone call 15 minutes prior to their appointment time (may be from unknown caller ID) so they should be prepared to answer    TELEPHONE CALL NOTE  Arlyn Yoke has been deemed a candidate for a follow-up tele-health visit to limit community exposure during the Covid-19 pandemic. I spoke with the patient via phone to ensure availability of phone/video source, confirm preferred email & phone number, and discuss instructions and expectations.  I reminded Sequioa Prusak to be prepared with any vital sign and/or heart rhythm information that could potentially be obtained via home monitoring, at the time of her visit. I reminded Tawsha Vangorp to expect a phone call prior to  her visit.  Alisa Graff, [place] 12/11/2019 11:55 AM   INSTRUCTIONS FOR DOWNLOADING THE MYCHART APP TO SMARTPHONE  - The patient must first make sure to have activated MyChart and know their login information - If Apple, go to [employer] and type in MyChart in the search bar and download the app. If Android, ask patient to go to [employer] and type in [place] in the search bar and download the app. The app is free but as with any other app downloads, their phone may require them to verify saved  payment information or Apple/Android password.  - The patient will need to then log into the app with their MyChart username and password, and select [facility] as their healthcare provider to link the account. When it is time for your visit, go to the MyChart app, find appointments, and click Begin Video Visit. Be sure to Select Allow for your device to access the Microphone and Camera for your visit. You will then be connected, and your provider will be with you shortly.  **If they have any issues connecting, or need assistance please contact MyChart service desk [phone])**  **If using a computer, in order to ensure the best quality for their visit they will need to use either of the following Internet Browsers: [employer], or Google Chrome**  IF USING DOXIMITY or DOXY.ME - The patient will receive a link just prior to their visit by text.     FULL LENGTH CONSENT FOR TELE-HEALTH VISIT   I hereby voluntarily request, consent and authorize [facility] and its employed or contracted physicians, physician assistants, nurse practitioners or other licensed health care professionals (the Practitioner), to provide me with telemedicine health care services (the "Services") as deemed necessary by the treating Practitioner. I acknowledge and consent to receive the Services by the Practitioner via telemedicine. I understand that the telemedicine visit will involve communicating with the Practitioner through live audiovisual communication technology and the disclosure of certain medical information by electronic transmission. I acknowledge that I have been given the opportunity to request an in-person assessment or other available alternative prior to the telemedicine visit and am voluntarily participating in the telemedicine visit.  I understand that I have the right to withhold or withdraw my consent to the use of telemedicine in the course of my care at any time, without affecting my  right to future care or treatment, and that the Practitioner or I may terminate the telemedicine visit at any time. I understand that I have the right to inspect all information obtained and/or recorded in the course of the telemedicine visit and may receive copies of available information for a reasonable fee.  I understand that some of the potential risks of receiving the Services via telemedicine include:  Marland Kitchen Delay or interruption in medical evaluation due to technological equipment failure or disruption; . Information transmitted may not be sufficient (e.g. poor resolution of images) to allow for appropriate medical decision making by the Practitioner; and/or  . In rare instances, security protocols could fail, causing a breach of personal health information.  Furthermore, I acknowledge that it is my responsibility to provide information about my medical history, conditions and care that is complete and accurate to the best of my ability. I acknowledge that Practitioner's advice, recommendations, and/or decision may be based on factors not within their control, such as incomplete or inaccurate data provided by me or distortions of diagnostic images or specimens that may result from electronic transmissions.  I understand that the practice of medicine is not an exact science and that Practitioner makes no warranties or guarantees regarding treatment outcomes. I acknowledge that I will receive a copy of this consent concurrently upon execution via email to the email address I last provided but may also request a printed copy by calling the office of [facility].    I understand that my insurance will be billed for this visit.   I have read or had this consent read to me. . I understand the contents of this consent, which adequately explains the benefits and risks of the Services being provided via telemedicine.  . I have been provided ample opportunity to ask questions regarding this consent  and the Services and have had my questions answered to my satisfaction. . I give my informed consent for the services to be provided through the use of telemedicine in my medical care  By participating in this telemedicine visit I agree to the above.

## 2019-12-11 NOTE — Patient Instructions (Signed)
Continue weighing daily and call for an overnight weight gain of > 2 pounds or a weekly weight gain of >5 pounds. 

## 2019-12-12 ENCOUNTER — Encounter: Payer: Self-pay | Admitting: Pain Medicine

## 2019-12-12 ENCOUNTER — Other Ambulatory Visit: Payer: Self-pay | Admitting: Physician Assistant

## 2019-12-12 ENCOUNTER — Other Ambulatory Visit: Payer: Self-pay | Admitting: Gastroenterology

## 2019-12-12 DIAGNOSIS — K219 Gastro-esophageal reflux disease without esophagitis: Secondary | ICD-10-CM

## 2019-12-12 NOTE — Progress Notes (Signed)
Patient: Chelsea Ramsey  Service Category: E/M  Provider: Gaspar Cola, MD  DOB: 1966/01/26  DOS: 12/13/2019  Location: Office  MRN: [id number]  Setting: Ambulatory outpatient  Referring Provider: Trinna Post, PA-C  Type: Established Patient  Specialty: Interventional Pain Management  PCP: Trinna Post, PA-C  Location: Remote location  Delivery: TeleHealth     Virtual Encounter - Pain Management PROVIDER NOTE: Information contained herein reflects review and annotations entered in association with encounter. Interpretation of such information and data should be left to medically-trained personnel. Information provided to patient can be located elsewhere in the medical Ramsey under "Patient Instructions". Document created using STT-dictation technology, any transcriptional errors that may result from process are unintentional.    Contact & Pharmacy Preferred: [phone] Home: [phone] (home) Mobile: [phone] (mobile) E-mail: [email or web address]  [facility] - [place] [facility], [place] - [place] [address] [place] [address] Phone: [phone] Fax: [phone]   Pre-screening  Chelsea Ramsey offered "in-person" vs "virtual" encounter. She indicated preferring virtual for this encounter.   Reason COVID-19*  Social distancing based on CDC and AMA recommendations.   I contacted Chelsea Ramsey on 12/13/2019 via telephone.      I clearly identified myself as Gaspar Cola, MD. I verified that I was speaking with the correct person using two identifiers (Name: Chelsea Ramsey, and date of birth: [birth year]).  Consent I sought verbal advanced consent from Chelsea Ramsey for virtual visit interactions. I informed Chelsea Ramsey of possible security and privacy concerns, risks, and limitations associated with providing "not-in-person" medical evaluation and management services. I also informed [name] of the availability of "in-person" appointments. Finally, I informed her that there would be a charge for the virtual visit and that she could be  personally, fully or partially, financially responsible for it. Chelsea Ramsey expressed understanding and agreed to proceed.   Historic Elements   Chelsea Ramsey is a 54 y.o. year old, female patient evaluated today after her last encounter by our practice on 09/13/2019. Chelsea Ramsey  has a past medical history of Acute drug-induced gout of left foot (03/01/2018), Allergy, Anxiety, Arthritis, Asthma, CHF (congestive heart failure) ([place]), COPD (chronic obstructive pulmonary disease) ([place]), Coronary artery disease, Dysrhythmia, Fibromyalgia, GERD (gastroesophageal reflux disease), Hypertension, Pneumonia, PUD (peptic ulcer disease), Pulmonary HTN ([place]), Rheumatic fever/heart disease, and Sleep apnea. 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 Chelsea Ramsey has a current medication list which includes the following prescription(s): albuterol, allopurinol, butalbital-acetaminophen-caffeine, gnp calcium 1200, d 5000, colchicine, diazepam, docusate sodium, furosemide, gabapentin, ipratropium-albuterol, lactulose, meclizine, metolazone, metoprolol tartrate, nitroglycerin, nortriptyline, oxycodone hcl, [START ON 01/12/2020] oxycodone hcl, [START ON 02/11/2020] oxycodone hcl, oxygen-helium, potassium chloride sa, promethazine, rivaroxaban, zolpidem, linzess, and omeprazole. She  reports that she has been smoking cigarettes. She has been smoking about 0.10 packs per day. She has never used smokeless tobacco. She reports  previous drug use. She reports that she does not drink alcohol. Chelsea Ramsey is allergic to amiodarone; aspirin; flexeril [cyclobenzaprine]; trazamine [name] & diet manage prod]; codeine; and tramadol.   HPI  Today, she is being contacted for medication management.  The patient indicates doing well with the current medication regimen. No adverse reactions or side effects reported to the medications.   Pharmacotherapy  Assessment  Analgesic: Oxycodone IR 10 mg, 1 tab PO BID (20 mg/day of oxycodone) MME/day: 30 mg/day.   Monitoring: Pharmacotherapy: No side-effects or adverse reactions reported. [place] PMP: PDMP reviewed during this encounter.       Compliance: No problems identified. Effectiveness: Clinically acceptable. Plan: Refer to "POC".  UDS:  Summary  Date Value Ref Range Status  02/08/2019 FINAL  Final    Comment:    ==================================================================== TOXASSURE SELECT 13 (MW) ==================================================================== Test                             Result       Flag       Units Drug Present not Declared for Prescription Verification   Oxycodone                      760          UNEXPECTED ng/mg creat   Oxymorphone                    1283         UNEXPECTED ng/mg creat   Noroxycodone                   1082         UNEXPECTED ng/mg creat   Noroxymorphone                 378          UNEXPECTED ng/mg creat    Sources of oxycodone are scheduled prescription medications.    Oxymorphone, noroxycodone, and noroxymorphone are expected    metabolites of oxycodone. Oxymorphone is also available as a    scheduled prescription medication. Drug Absent but Declared for Prescription Verification   Butalbital                     Not Detected UNEXPECTED ==================================================================== Test                      Result    Flag   Units      Ref Range   Creatinine              107               mg/dL      >=20 ==================================================================== Declared Medications:  The flagging and interpretation on this report are based on the  following declared medications.  Unexpected results may arise from  inaccuracies in the declared medications.  **Note: The testing scope of this panel includes these medications:  Butalbital (Fioricet)  **Note: The testing scope of this panel does not include following  reported medications:  Acetaminophen (Fioricet)  Albuterol (Duoneb)  Allopurinol  Bisacodyl  Budesonide (Symbicort)  Caffeine (Fioricet)  Calcium (Calcium/Vitamin D)  Cholecalciferol  Colchicine  Formoterol (Symbicort)  Furosemide  Gabapentin  Guaifenesin  Ipratropium (Duoneb)  Lactulose  Vitamin D (Calcium/Vitamin D) ==================================================================== For clinical consultation, please call [phone]. ====================================================================    Laboratory Chemistry Profile (12 mo)  Renal: 10/02/2019: BUN 19; Creatinine, Ser 0.67  Lab Results  Component Value Date   GFRAA >60 10/02/2019   GFRNONAA >60 10/02/2019   Hepatic: 08/24/2019: Albumin 4.0 Lab Results  Component Value Date   AST 21 08/24/2019   ALT 17 08/24/2019   Other: 12/19/2018: 25-Hydroxy, Vitamin D 38; 25-Hydroxy, Vitamin D-2 33; 25-Hydroxy, Vitamin D-3 4.7 Note: Above Lab results  reviewed.  Imaging  MR BRAIN WO CONTRAST CLINICAL DATA:  54 year old female with headaches, dizziness, memory loss and visual disturbance since July 2019 following open heart surgery.  EXAM: MRI HEAD WITHOUT CONTRAST  TECHNIQUE: Multiplanar, multiecho pulse sequences of the brain and surrounding structures were obtained without intravenous contrast.  COMPARISON:  Brain MRI 10/11/2018 and earlier.  FINDINGS: Brain: No restricted diffusion to suggest acute infarction. No midline shift, mass effect, evidence of mass  lesion, ventriculomegaly, extra-axial collection or acute intracranial hemorrhage. Cervicomedullary junction and pituitary are within normal limits.  Cerebral volume remains normal for age. Pearline Cables and white matter signal throughout the brain is stable and largely normal for age.  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  No definite chronic blood products.  No new signal abnormality.  Vascular: Major intracranial vascular flow voids are stable.  Skull and upper cervical spine: Negative visible cervical spine. Visualized bone marrow signal is within normal limits.  Sinuses/Orbits: Chronic left orbital floor fracture with herniated intraorbital fat. See series 17, image 23. Otherwise negative orbits and paranasal sinuses.  Other: Mastoids remain clear. Visible internal auditory structures appear normal. Scalp and face soft tissues appear negative.  IMPRESSION: 1.  No acute intracranial abnormality. Stable noncontrast MRI appearance of the brain since 2015, negative for age aside from a small chronic left cerebellar PICA infarct.  2. 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 Note that occasionally such an injury can be associated with dysfunction of the Oculocardiac Reflex.  Electronically Signed   By: Genevie Ann M.D.   On: 07/23/2019 16:26   Assessment  The primary encounter diagnosis was Chronic pain syndrome. Diagnoses of Chronic low back pain (Primary Area of Pain) (Bilateral) (L>R) w/o sciatica, Chronic lower extremity pain (Secondary Area of Pain) (Left), Chronic knee pain after total replacement (Fifth Area of Pain) (Right), and Chronic gout involving toe of foot, unspecified cause (Left) were also pertinent to this visit.  Plan of Care  Problem-specific:  No problem-specific Assessment & Plan notes found for this  encounter.  I have discontinued Lorea C. Rockwell "[name]"'s guaiFENesin and pregabalin. I am also having her start on Oxycodone HCl and Oxycodone HCl. Additionally, I am having her maintain her lactulose, nitroGLYCERIN, gabapentin, butalbital-acetaminophen-caffeine, metoprolol tartrate, nortriptyline, metolazone, albuterol, docusate sodium, D 5000, allopurinol, colchicine, GNP Calcium 1200, potassium chloride SA, ipratropium-albuterol, rivaroxaban, OXYGEN, meclizine, promethazine, furosemide, zolpidem, diazepam, and Oxycodone HCl.  Pharmacotherapy (Medications Ordered): Meds ordered this encounter  Medications  . Oxycodone HCl 10 MG TABS    Sig: Take 0.5-1 tablets (5-10 mg total) by mouth 2 (two) times daily as needed. Must last 30 days. MAX.: 2/day    Dispense:  20 tablet    Refill:  0    Chronic Pain: STOP Act (Not applicable) Fill 1 day early if closed on refill date. Do not fill until: 12/13/2019. To last until: 01/12/2020. Avoid benzodiazepines within 8 hours of opioids  . Oxycodone HCl 10 MG TABS    Sig: Take 0.5-1 tablets (5-10 mg total) by mouth 2 (two) times daily as needed. Must last 30 days. MAX.: 2/day    Dispense:  20 tablet    Refill:  0    Chronic Pain: STOP Act (Not applicable) Fill 1 day early if closed on refill date. Do not fill until: 01/12/2020. To last until: 02/11/2020. Avoid benzodiazepines within 8 hours  of opioids  . Oxycodone HCl 10 MG TABS    Sig: Take 0.5-1 tablets (5-10 mg total) by mouth 2 (two) times daily as needed. Must last 30 days. MAX.: 2/day    Dispense:  20 tablet    Refill:  0    Chronic Pain: STOP Act (Not applicable) Fill 1 day early if closed on refill date. Do not fill until: 02/11/2020. To last until: 03/12/2020. Avoid benzodiazepines within 8 hours of opioids   Orders:  No orders of the defined types were placed in this encounter.  Follow-up plan:   Return in about 3 months (around 03/11/2020) for (VV), (MM).      Interventional management  options: Considering:   NOTE: XARELTOAnticoagulation.(Stop:x 3 days prior to procedures. Re-start:6 hours after) - (NO STEROIDS- CHF) - NO RFA until BMI < 35 Diagnostic bilateral lumbar facet nerve blocks(100/100/40 w/o steroids) Diagnostic bilateral lumbar facet RFA(NO RFA until BMI <35) Diagnostic bilateral sacroiliac joint block  Possible bilateral sacroiliac joint RFA (NO RFA until BMI <35) Diagnostic right knee genicular nerve block Possible right knee genicular nerve RFA   Palliative PRN treatment(s):   None at this time     Recent Visits No visits were found meeting these conditions.  Showing recent visits within past 90 days and meeting all other requirements   Today's Visits Date Type Provider Dept  12/13/19 Telemedicine Milinda Pointer, MD [facility]  Showing today's visits and meeting all other requirements   Future Appointments No visits were found meeting these conditions.  Showing future appointments within next 90 days and meeting all other requirements   I discussed the assessment and treatment plan with the patient. The patient was provided an opportunity to ask questions and all were answered. The patient agreed with the plan and demonstrated an understanding of the instructions.  Patient advised to call back or seek an in-person evaluation if the symptoms or condition worsens.  Total duration of non-face-to-face encounter: 13 minutes.  Note by: Gaspar Cola, MD Date: 12/13/2019; Time: 11:04 AM

## 2019-12-13 ENCOUNTER — Other Ambulatory Visit: Payer: Self-pay

## 2019-12-13 ENCOUNTER — Ambulatory Visit: Payer: Medicaid Other | Admitting: Gastroenterology

## 2019-12-13 ENCOUNTER — Encounter: Payer: Self-pay | Admitting: Pain Medicine

## 2019-12-13 ENCOUNTER — Ambulatory Visit: Payer: Medicaid Other | Attending: Pain Medicine | Admitting: Pain Medicine

## 2019-12-13 DIAGNOSIS — G894 Chronic pain syndrome: Secondary | ICD-10-CM | POA: Diagnosis not present

## 2019-12-13 DIAGNOSIS — M109 Gout, unspecified: Secondary | ICD-10-CM

## 2019-12-13 DIAGNOSIS — M1A9XX0 Chronic gout, unspecified, without tophus (tophi): Secondary | ICD-10-CM

## 2019-12-13 DIAGNOSIS — M25561 Pain in right knee: Secondary | ICD-10-CM | POA: Diagnosis not present

## 2019-12-13 DIAGNOSIS — Z96651 Presence of right artificial knee joint: Secondary | ICD-10-CM

## 2019-12-13 DIAGNOSIS — M79605 Pain in left leg: Secondary | ICD-10-CM

## 2019-12-13 DIAGNOSIS — G8929 Other chronic pain: Secondary | ICD-10-CM

## 2019-12-13 DIAGNOSIS — M545 Low back pain: Secondary | ICD-10-CM

## 2019-12-13 MED ORDER — OXYCODONE HCL 10 MG PO TABS: 5.0000 mg | ORAL_TABLET | Freq: Two times a day (BID) | ORAL | 0 refills | Status: DC | PRN

## 2019-12-13 NOTE — Patient Instructions (Signed)
____________________________________________________________________________________________  Medication Rules  Purpose: To inform patients, and their family members, of our rules and regulations.  Applies to: All patients receiving prescriptions (written or electronic).  Pharmacy of record: Pharmacy where electronic prescriptions will be sent. If written prescriptions are taken to a different pharmacy, please inform the nursing staff. The pharmacy listed in the electronic medical record should be the one where you would like electronic prescriptions to be sent.  Electronic prescriptions: In compliance with the [place] Strengthen Opioid Misuse Prevention (STOP) Act of 2017 (Session Law 2017-74/H243), effective November 30, 2018, all controlled substances must be electronically prescribed. Calling prescriptions to the pharmacy will cease to exist.  Prescription refills: Only during scheduled appointments. Applies to all prescriptions.  NOTE: The following applies primarily to controlled substances (Opioid* Pain Medications).   Patient's responsibilities: 1. Pain Pills: Bring all pain pills to every appointment (except for procedure appointments). 2. Pill Bottles: Bring pills in original pharmacy bottle. Always bring the newest bottle. Bring bottle, even if empty. 3. Medication refills: You are responsible for knowing and keeping track of what medications you take and those you need refilled. The day before your appointment: write a list of all prescriptions that need to be refilled. The day of the appointment: give the list to the admitting nurse. Prescriptions will be written only during appointments. No prescriptions will be written on procedure days. If you forget a medication: it will not be "Called in", "Faxed", or "electronically sent". You will need to get another appointment to get these prescribed. No early refills. Do not call asking to have your prescription filled  early. 4. Prescription Accuracy: You are responsible for carefully inspecting your prescriptions before leaving our office. Have the discharge nurse carefully go over each prescription with you, before taking them home. Make sure that your name is accurately spelled, that your address is correct. Check the name and dose of your medication to make sure it is accurate. Check the number of pills, and the written instructions to make sure they are clear and accurate. Make sure that you are given enough medication to last until your next medication refill appointment. 5. Taking Medication: Take medication as prescribed. When it comes to controlled substances, taking less pills or less frequently than prescribed is permitted and encouraged. Never take more pills than instructed. Never take medication more frequently than prescribed.  6. Inform other Doctors: Always inform, all of your healthcare providers, of all the medications you take. 7. Pain Medication from other Providers: You are not allowed to accept any additional pain medication from any other Doctor or Healthcare provider. There are two exceptions to this rule. (see below) In the event that you require additional pain medication, you are responsible for notifying us, as stated below. 8. Medication Agreement: You are responsible for carefully reading and following our Medication Agreement. This must be signed before receiving any prescriptions from our practice. Safely store a copy of your signed Agreement. Violations to the Agreement will result in no further prescriptions. (Additional copies of our Medication Agreement are available upon request.) 9. Laws, Rules, & Regulations: All patients are expected to follow all Federal and State Laws, Statutes, Rules, & Regulations. Ignorance of the Laws does not constitute a valid excuse. The use of any illegal substances is prohibited. 10. Adopted CDC guidelines & recommendations: Target dosing levels will be  at or below 60 MME/day. Use of benzodiazepines** is not recommended.  Exceptions: There are only two exceptions to the rule of not   receiving pain medications from other Healthcare Providers. 1. Exception #1 (Emergencies): In the event of an emergency (i.e.: accident requiring emergency care), you are allowed to receive additional pain medication. However, you are responsible for: As soon as you are able, call our office [phone], at any time of the day or night, and leave a message stating your name, the date and nature of the emergency, and the name and dose of the medication prescribed. In the event that your call is answered by a member of our staff, make sure to document and save the date, time, and the name of the person that took your information.  2. Exception #2 (Planned Surgery): In the event that you are scheduled by another doctor or dentist to have any type of surgery or procedure, you are allowed (for a period no longer than 30 days), to receive additional pain medication, for the acute post-op pain. However, in this case, you are responsible for picking up a copy of our "Post-op Pain Management for Surgeons" handout, and giving it to your surgeon or dentist. This document is available at our office, and does not require an appointment to obtain it. Simply go to our office during business hours (Monday-Thursday from 8:00 AM to 4:00 PM) (Friday 8:00 AM to 12:00 Noon) or if you have a scheduled appointment with us, prior to your surgery, and ask for it by name. In addition, you will need to provide us with your name, name of your surgeon, type of surgery, and date of procedure or surgery.  *Opioid medications include: morphine, codeine, oxycodone, oxymorphone, hydrocodone, hydromorphone, meperidine, tramadol, tapentadol, buprenorphine, fentanyl, methadone. **Benzodiazepine medications include: diazepam (Valium), alprazolam (Xanax), clonazepam (Klonopine), lorazepam (Ativan), clorazepate  (Tranxene), chlordiazepoxide (Librium), estazolam (Prosom), oxazepam (Serax), temazepam (Restoril), triazolam (Halcion) (Last updated: 01/27/2018) ____________________________________________________________________________________________   ____________________________________________________________________________________________  Medication Recommendations and Reminders  Applies to: All patients receiving prescriptions (written and/or electronic).  Medication Rules & Regulations: These rules and regulations exist for your safety and that of others. They are not flexible and neither are we. Dismissing or ignoring them will be considered "non-compliance" with medication therapy, resulting in complete and irreversible termination of such therapy. (See document titled "Medication Rules" for more details.) In all conscience, because of safety reasons, we cannot continue providing a therapy where the patient does not follow instructions.  Pharmacy of record:   Definition: This is the pharmacy where your electronic prescriptions will be sent.   We do not endorse any particular pharmacy.  You are not restricted in your choice of pharmacy.  The pharmacy listed in the electronic medical record should be the one where you want electronic prescriptions to be sent.  If you choose to change pharmacy, simply notify our nursing staff of your choice of new pharmacy.  Recommendations:  Keep all of your pain medications in a safe place, under lock and key, even if you live alone.   After you fill your prescription, take 1 week's worth of pills and put them away in a safe place. You should keep a separate, properly labeled bottle for this purpose. The remainder should be kept in the original bottle. Use this as your primary supply, until it runs out. Once it's gone, then you know that you have 1 week's worth of medicine, and it is time to come in for a prescription refill. If you do this correctly, it  is unlikely that you will ever run out of medicine.  To make sure that the above recommendation works,   it is very important that you make sure your medication refill appointments are scheduled at least 1 week before you run out of medicine. To do this in an effective manner, make sure that you do not leave the office without scheduling your next medication management appointment. Always ask the nursing staff to show you in your prescription , when your medication will be running out. Then arrange for the receptionist to get you a return appointment, at least 7 days before you run out of medicine. Do not wait until you have 1 or 2 pills left, to come in. This is very poor planning and does not take into consideration that we may need to cancel appointments due to bad weather, sickness, or emergencies affecting our staff.  "Partial Fill": If for any reason your pharmacy does not have enough pills/tablets to completely fill or refill your prescription, do not allow for a "partial fill". You will need a separate prescription to fill the remaining amount, which we will not provide. If the reason for the partial fill is your insurance, you will need to talk to the pharmacist about payment alternatives for the remaining tablets, but again, do not accept a partial fill.  Prescription refills and/or changes in medication(s):   Prescription refills, and/or changes in dose or medication, will be conducted only during scheduled medication management appointments. (Applies to both, written and electronic prescriptions.)  No refills on procedure days. No medication will be changed or started on procedure days. No changes, adjustments, and/or refills will be conducted on a procedure day. Doing so will interfere with the diagnostic portion of the procedure.  No phone refills. No medications will be "called into the pharmacy".  No Fax refills.  No weekend refills.  No Holliday refills.  No after hours  refills.  Remember:  Business hours are:  Monday to Thursday 8:00 AM to 4:00 PM Provider's Schedule: Gilliam Hawkes, MD - Appointments are:  Medication management: Monday and Wednesday 8:00 AM to 4:00 PM Procedure day: Tuesday and Thursday 7:30 AM to 4:00 PM Bilal Lateef, MD - Appointments are:  Medication management: Tuesday and Thursday 8:00 AM to 4:00 PM Procedure day: Monday and Wednesday 7:30 AM to 4:00 PM (Last update: 01/27/2018) ____________________________________________________________________________________________   ____________________________________________________________________________________________  CANNABIDIOL (AKA: CBD Oil or Pills)  Applies to: All patients receiving prescriptions of controlled substances (written and/or electronic).  General Information: Cannabidiol (CBD) was discovered in 1940. It is one of some 113 identified cannabinoids in cannabis (Marijuana) plants, accounting for up to 40% of the plant's extract. As of 2018, preliminary clinical research on cannabidiol included studies of anxiety, cognition, movement disorders, and pain.  Cannabidiol is consummed in multiple ways, including inhalation of cannabis smoke or vapor, as an aerosol spray into the cheek, and by mouth. It may be supplied as CBD oil containing CBD as the active ingredient (no added tetrahydrocannabinol (THC) or terpenes), a full-plant CBD-dominant hemp extract oil, capsules, dried cannabis, or as a liquid solution. CBD is thought not have the same psychoactivity as THC, and may affect the actions of THC. Studies suggest that CBD may interact with different biological targets, including cannabinoid receptors and other neurotransmitter receptors. As of 2018 the mechanism of action for its biological effects has not been determined.  In the [place], cannabidiol has a limited approval by the Food and Drug Administration (FDA) for treatment of only two types of epilepsy  disorders. The side effects of long-term use of the drug include somnolence, decreased appetite, diarrhea,   fatigue, malaise, weakness, sleeping problems, and others.  CBD remains a Schedule I drug prohibited for any use.  Legality: Some manufacturers ship CBD products nationally, an illegal action which the FDA has not enforced in 2018, with CBD remaining the subject of an FDA investigational new drug evaluation, and is not considered legal as a dietary supplement or food ingredient as of December 2018. Federal illegality has made it difficult historically to conduct research on CBD. CBD is openly sold in head shops and health food stores in some states where such sales have not been explicitly legalized.  Warning: Because it is not FDA approved for general use or treatment of pain, it is not required to undergo the same manufacturing controls as prescription drugs.  This means that the available cannabidiol (CBD) may be contaminated with THC.  If this is the case, it will trigger a positive urine drug screen (UDS) test for cannabinoids (Marijuana).  Because a positive UDS for illicit substances is a violation of our medication agreement, your opioid analgesics (pain medicine) may be permanently discontinued. (Last update: 02/17/2018) ____________________________________________________________________________________________    

## 2019-12-13 NOTE — Telephone Encounter (Signed)
I just received a phone call from the hospital indicated that the patient wanted to talk to me.  I was connected to the patient and she indicated to me that I had called her oxycodone prescription to the pharmacy wrong.  Instead of sending 60 pills I sent the prescription for 20 pills.  I have explained to the patient that since the last time that I spoke to her, she went from taking 1 Valium PRN to receiving prescriptions for Valium 2 mg number 90 pills/month for the past 3 months.  Suddenly this last prescription was increased to 5 mg number 60 pills.  The patient indicates that she still takes the Valium as needed but her pharmacy has been sending the pills to her house.  For very clear reasons I will not be increasing her oxycodone back to the way it was since she is running the risk of a drug to drug interaction that will cause respiratory depression and death.  She indicates that since she started receiving those prescriptions she has only used 2 pills.  This would mean that she should have 328 pills at home that she is not using.  Today I told the patient to collect all those pills and to bring them tomorrow to the pain clinic where we will count them and confirm that this is the case if she indeed can return to [place] 328 pills of Valium, we will be disposing of all of them in front of her and documented that this was done.  At that point I will then go back to the dose of oxycodone that I had a before, but not until I can confirm that she is really not taking the Valium.

## 2019-12-14 ENCOUNTER — Encounter: Payer: Self-pay | Admitting: Pain Medicine

## 2019-12-14 ENCOUNTER — Other Ambulatory Visit: Payer: Self-pay

## 2019-12-14 ENCOUNTER — Ambulatory Visit: Payer: Medicaid Other | Attending: Pain Medicine | Admitting: Pain Medicine

## 2019-12-14 VITALS — BP 106/73 | HR 72 | Temp 97.4°F

## 2019-12-14 DIAGNOSIS — G894 Chronic pain syndrome: Secondary | ICD-10-CM | POA: Insufficient documentation

## 2019-12-14 DIAGNOSIS — F1911 Other psychoactive substance abuse, in remission: Secondary | ICD-10-CM | POA: Diagnosis not present

## 2019-12-14 DIAGNOSIS — Z79891 Long term (current) use of opiate analgesic: Secondary | ICD-10-CM | POA: Diagnosis not present

## 2019-12-14 DIAGNOSIS — Z79899 Other long term (current) drug therapy: Secondary | ICD-10-CM | POA: Diagnosis not present

## 2019-12-14 MED ORDER — OXYCODONE HCL 10 MG PO TABS: 5.0000 mg | ORAL_TABLET | Freq: Two times a day (BID) | ORAL | 0 refills | Status: DC | PRN

## 2019-12-14 NOTE — Progress Notes (Signed)
Patient: Chelsea Ramsey  Service Category: E/M  Provider: Gaspar Cola, MD  DOB: 09/04/1966  DOS: 12/14/2019  Referring Provider: Paulene Floor  MRN: [id number]  Setting: Ambulatory outpatient  PCP: Trinna Post, PA-C  Type: Established Patient  Specialty: Interventional Pain Management    Location: Office  Delivery: Face-to-face     CC: No chief complaint on file.  HPI: Chelsea Ramsey is a 54 y.o. year old, female patient, who comes today complaining of No chief complaint on file. Her last contact with [place] was on 09/13/2019. I personally saw her on 09/13/2019. Severity of the pain is described as a  /10. Her pain is located in the area of the     and  . Onset:  . Pain is descriptors include:  Marland Kitchen Tempo is described as:  Marland Kitchen Modifying factors:  .  Last night the patient call me in a panic because when she went on to get her oxycodone prescription, she noticed that we had cut the amount of pills that she was getting to 20/month.  I explained to the patient that we did that because she has been increasing the amount of benzodiazepines that she has been using.  When we first started prescribing to her, she was not taking any benzodiazepines or sleep inducers (CNS depressants).  However just prior to our last refill, her benzodiazepine prescription started to be regular and to take a significant amount of pills.  Even though, I had previously talked to the patient about the risk of combining the benzodiazepines and the opioids, she still continued to get increasing supplies and doses of the diazepam.  She rapidly went from a prescription of diazepam 5 mg #2 pills PRN on 01/12/2019 (10 mg/mo), to monthly diazepam 2 mg #90 pills on 09/07/2019 (180 mg/mo), to diazepam 5 mg #60 pills on 11/17/2019 (300 mg/mo).   When I spoke to her last night she kept telling me that she was not taking the benzodiazepines on a regular basis and in fact that she had only taken 2 pills since  she started receiving the prescriptions.  I quickly did the math on that and since he has received 3 different prescriptions of the 2 mg diazepam, with 90 pills each (90x3=270), and she has received at least 1 prescription for Valium 5 mg #60 (60 pills), this gives [place] a grand total of 330 tablets.  If she is telling me the truth that she only took 2 pills, that means that she has to have 328 tablets left.  Last night I told her that before I could start giving her the oxycodone 10 mg pills (#60) again, she would have to come in and bring those all of the diazepam (Valium) pills so that we could dispose of them since she is not using them.  I was very clear last night that she needed to bring back her 328 pills that he claimed to have.  This was before I called the pharmacy and learned that she had picked up another prescription of the Valium 5 mg #20.  This means that since 09/07/2019, she has received a total of 348 Valium tablets.  Today she comes in and out of the 348 tablets, she only has 154 tablets left.  It is painfully obvious that she has lied to me since the "couple" of tablets that she took amounts to 194 pills.  Today we have confiscated the remaining 154 pills and we have destroyed them in  from the patient.  The issue at hand here is not only that the patient has a prior history of substance abuse, but she has chosen to like to me about her benzodiazepine use for the purpose of obtaining more of her oxycodone.  Today I was very clear with the patient that this is completely unacceptable and due to this we will have to go back to monitoring her very closely.  I also reminded the patient that should any additional violations of our medication agreement occur, we will immediately and permanently discontinue the use of any opioid analgesics.  I also reminded the patient that being "inaccurate" when it comes to her medication use, is not acceptable and will not be tolerated again.  She understood and  accepted.  Pharmacotherapy Assessment  Analgesic: Oxycodone IR 10 mg, 1 tab PO BID (20 mg/day of oxycodone) MME/day: 30 mg/day.   Monitoring: Pharmacotherapy: No side-effects or adverse reactions reported. [place] PMP: PDMP reviewed during this encounter.       Compliance: No problems identified. Effectiveness: Clinically acceptable. Plan: Refer to "POC".  UDS:  Summary  Date Value Ref Range Status  02/08/2019 FINAL  Final    Comment:    ==================================================================== TOXASSURE SELECT 13 (MW) ==================================================================== Test                             Result       Flag       Units Drug Present not Declared for Prescription Verification   Oxycodone                      760          UNEXPECTED ng/mg creat   Oxymorphone                    1283         UNEXPECTED ng/mg creat   Noroxycodone                   1082         UNEXPECTED ng/mg creat   Noroxymorphone                 378          UNEXPECTED ng/mg creat    Sources of oxycodone are scheduled prescription medications.    Oxymorphone, noroxycodone, and noroxymorphone are expected    metabolites of oxycodone. Oxymorphone is also available as a    scheduled prescription medication. Drug Absent but Declared for Prescription Verification   Butalbital                     Not Detected UNEXPECTED ==================================================================== Test                      Result    Flag   Units      Ref Range   Creatinine              107              mg/dL      >=20 ==================================================================== Declared Medications:  The flagging and interpretation on this report are based on the  following declared medications.  Unexpected results may arise from  inaccuracies in the declared medications.  **Note: The testing scope of this panel includes these medications:  Butalbital (Fioricet)  **Note: The testing  scope of this panel does  not include following  reported medications:  Acetaminophen (Fioricet)  Albuterol (Duoneb)  Allopurinol  Bisacodyl  Budesonide (Symbicort)  Caffeine (Fioricet)  Calcium (Calcium/Vitamin D)  Cholecalciferol  Colchicine  Formoterol (Symbicort)  Furosemide  Gabapentin  Guaifenesin  Ipratropium (Duoneb)  Lactulose  Vitamin D (Calcium/Vitamin D) ==================================================================== For clinical consultation, please call [phone]. ====================================================================     Exam: Chelsea Ramsey's  temperature is 97.4 F (36.3 C) (abnormal). Her blood pressure is 106/73 and her pulse is 72. Her oxygen saturation is 100%.  There is no height or weight on file to calculate BMI.  A/P: The primary encounter diagnosis was Long term current use of opiate analgesic. Diagnoses of History of substance abuse ([place]), Chronic pain syndrome, and Pharmacologic therapy were also pertinent to this visit. I am having Chelsea Ramsey "[name]" maintain her lactulose, nitroGLYCERIN, gabapentin, butalbital-acetaminophen-caffeine, metoprolol tartrate, nortriptyline, metolazone, albuterol, docusate sodium, D 5000, allopurinol, colchicine, GNP Calcium 1200, potassium chloride SA, ipratropium-albuterol, rivaroxaban, OXYGEN, meclizine, promethazine, furosemide, zolpidem, diazepam, Linzess, omeprazole, and Oxycodone HCl. Return in about 1 month (around 01/14/2020) for (VV), (MM).  Plan of Care  Pharmacotherapy (Medications Ordered): Meds ordered this encounter  Medications  . Oxycodone HCl 10 MG TABS    Sig: Take 0.5-1 tablets (5-10 mg total) by mouth 2 (two) times daily as needed. Must last 30 days. MAX.: 2/day    Dispense:  60 tablet    Refill:  0    We have eliminated the patient's benzodiazepines and therefore we have agreed to go back to the 60 pills/month.  The last prescription written on 12/13/2019 should therefore last  only until 12/23/2019.   Administered today: Chelsea Ramsey "[name]" had no medications administered during this visit.  Orders:  Orders Placed This Encounter  Procedures  . UDS (Compliance-13) ([facility]) ([facility]) (Established Pt.)    Volume: 30 ml(s). Minimum 3 ml of urine is needed. Document temperature of fresh sample. Indications: Long term (current) use of opiate analgesic [id number])   Follow-up plan:   Return in about 1 month (around 01/14/2020) for (VV), (MM).      Interventional management options: Considering:   NOTE: XARELTOAnticoagulation.(Stop:x 3 days prior to procedures. Re-start:6 hours after) - (NO STEROIDS- CHF) - NO RFA until BMI < 35 Diagnostic bilateral lumbar facet nerve blocks(100/100/40 w/o steroids) Diagnostic bilateral lumbar facet RFA(NO RFA until BMI <35) Diagnostic bilateral sacroiliac joint block  Possible bilateral sacroiliac joint RFA (NO RFA until BMI <35) Diagnostic right knee genicular nerve block Possible right knee genicular nerve RFA   Palliative PRN treatment(s):   None at this time      Recent Visits Date Type Provider Dept  12/13/19 Telemedicine Milinda Pointer, MD [facility]  Showing recent visits within past 90 days and meeting all other requirements   Today's Visits Date Type Provider Dept  12/14/19 Office Visit Milinda Pointer, MD [facility]  Showing today's visits and meeting all other requirements   Future Appointments Date Type Provider Dept  03/11/20 Appointment Milinda Pointer, MD [facility]  Showing future appointments within next 90 days and meeting all other requirements   Note by: Gaspar Cola, MD Date: 12/14/2019; Time: 12:17 PM  Note: This dictation was prepared with Dragon dictation. Any transcriptional errors that may result from this process are unintentional.

## 2019-12-14 NOTE — Patient Instructions (Signed)
____________________________________________________________________________________________  Medication Rules  Purpose: To inform patients, and their family members, of our rules and regulations.  Applies to: All patients receiving prescriptions (written or electronic).  Pharmacy of record: Pharmacy where electronic prescriptions will be sent. If written prescriptions are taken to a different pharmacy, please inform the nursing staff. The pharmacy listed in the electronic medical record should be the one where you would like electronic prescriptions to be sent.  Electronic prescriptions: In compliance with the [place] Strengthen Opioid Misuse Prevention (STOP) Act of 2017 (Session Law 2017-74/H243), effective November 30, 2018, all controlled substances must be electronically prescribed. Calling prescriptions to the pharmacy will cease to exist.  Prescription refills: Only during scheduled appointments. Applies to all prescriptions.  NOTE: The following applies primarily to controlled substances (Opioid* Pain Medications).   Patient's responsibilities: 1. Pain Pills: Bring all pain pills to every appointment (except for procedure appointments). 2. Pill Bottles: Bring pills in original pharmacy bottle. Always bring the newest bottle. Bring bottle, even if empty. 3. Medication refills: You are responsible for knowing and keeping track of what medications you take and those you need refilled. The day before your appointment: write a list of all prescriptions that need to be refilled. The day of the appointment: give the list to the admitting nurse. Prescriptions will be written only during appointments. No prescriptions will be written on procedure days. If you forget a medication: it will not be "Called in", "Faxed", or "electronically sent". You will need to get another appointment to get these prescribed. No early refills. Do not call asking to have your prescription filled  early. 4. Prescription Accuracy: You are responsible for carefully inspecting your prescriptions before leaving our office. Have the discharge nurse carefully go over each prescription with you, before taking them home. Make sure that your name is accurately spelled, that your address is correct. Check the name and dose of your medication to make sure it is accurate. Check the number of pills, and the written instructions to make sure they are clear and accurate. Make sure that you are given enough medication to last until your next medication refill appointment. 5. Taking Medication: Take medication as prescribed. When it comes to controlled substances, taking less pills or less frequently than prescribed is permitted and encouraged. Never take more pills than instructed. Never take medication more frequently than prescribed.  6. Inform other Doctors: Always inform, all of your healthcare providers, of all the medications you take. 7. Pain Medication from other Providers: You are not allowed to accept any additional pain medication from any other Doctor or Healthcare provider. There are two exceptions to this rule. (see below) In the event that you require additional pain medication, you are responsible for notifying us, as stated below. 8. Medication Agreement: You are responsible for carefully reading and following our Medication Agreement. This must be signed before receiving any prescriptions from our practice. Safely store a copy of your signed Agreement. Violations to the Agreement will result in no further prescriptions. (Additional copies of our Medication Agreement are available upon request.) 9. Laws, Rules, & Regulations: All patients are expected to follow all Federal and State Laws, Statutes, Rules, & Regulations. Ignorance of the Laws does not constitute a valid excuse. The use of any illegal substances is prohibited. 10. Adopted CDC guidelines & recommendations: Target dosing levels will be  at or below 60 MME/day. Use of benzodiazepines** is not recommended.  Exceptions: There are only two exceptions to the rule of not   receiving pain medications from other Healthcare Providers. 1. Exception #1 (Emergencies): In the event of an emergency (i.e.: accident requiring emergency care), you are allowed to receive additional pain medication. However, you are responsible for: As soon as you are able, call our office [phone], at any time of the day or night, and leave a message stating your name, the date and nature of the emergency, and the name and dose of the medication prescribed. In the event that your call is answered by a member of our staff, make sure to document and save the date, time, and the name of the person that took your information.  2. Exception #2 (Planned Surgery): In the event that you are scheduled by another doctor or dentist to have any type of surgery or procedure, you are allowed (for a period no longer than 30 days), to receive additional pain medication, for the acute post-op pain. However, in this case, you are responsible for picking up a copy of our "Post-op Pain Management for Surgeons" handout, and giving it to your surgeon or dentist. This document is available at our office, and does not require an appointment to obtain it. Simply go to our office during business hours (Monday-Thursday from 8:00 AM to 4:00 PM) (Friday 8:00 AM to 12:00 Noon) or if you have a scheduled appointment with us, prior to your surgery, and ask for it by name. In addition, you will need to provide us with your name, name of your surgeon, type of surgery, and date of procedure or surgery.  *Opioid medications include: morphine, codeine, oxycodone, oxymorphone, hydrocodone, hydromorphone, meperidine, tramadol, tapentadol, buprenorphine, fentanyl, methadone. **Benzodiazepine medications include: diazepam (Valium), alprazolam (Xanax), clonazepam (Klonopine), lorazepam (Ativan), clorazepate  (Tranxene), chlordiazepoxide (Librium), estazolam (Prosom), oxazepam (Serax), temazepam (Restoril), triazolam (Halcion) (Last updated: 01/27/2018) ____________________________________________________________________________________________   ____________________________________________________________________________________________  Medication Recommendations and Reminders  Applies to: All patients receiving prescriptions (written and/or electronic).  Medication Rules & Regulations: These rules and regulations exist for your safety and that of others. They are not flexible and neither are we. Dismissing or ignoring them will be considered "non-compliance" with medication therapy, resulting in complete and irreversible termination of such therapy. (See document titled "Medication Rules" for more details.) In all conscience, because of safety reasons, we cannot continue providing a therapy where the patient does not follow instructions.  Pharmacy of record:   Definition: This is the pharmacy where your electronic prescriptions will be sent.   We do not endorse any particular pharmacy.  You are not restricted in your choice of pharmacy.  The pharmacy listed in the electronic medical record should be the one where you want electronic prescriptions to be sent.  If you choose to change pharmacy, simply notify our nursing staff of your choice of new pharmacy.  Recommendations:  Keep all of your pain medications in a safe place, under lock and key, even if you live alone.   After you fill your prescription, take 1 week's worth of pills and put them away in a safe place. You should keep a separate, properly labeled bottle for this purpose. The remainder should be kept in the original bottle. Use this as your primary supply, until it runs out. Once it's gone, then you know that you have 1 week's worth of medicine, and it is time to come in for a prescription refill. If you do this correctly, it  is unlikely that you will ever run out of medicine.  To make sure that the above recommendation works,   it is very important that you make sure your medication refill appointments are scheduled at least 1 week before you run out of medicine. To do this in an effective manner, make sure that you do not leave the office without scheduling your next medication management appointment. Always ask the nursing staff to show you in your prescription , when your medication will be running out. Then arrange for the receptionist to get you a return appointment, at least 7 days before you run out of medicine. Do not wait until you have 1 or 2 pills left, to come in. This is very poor planning and does not take into consideration that we may need to cancel appointments due to bad weather, sickness, or emergencies affecting our staff.  "Partial Fill": If for any reason your pharmacy does not have enough pills/tablets to completely fill or refill your prescription, do not allow for a "partial fill". You will need a separate prescription to fill the remaining amount, which we will not provide. If the reason for the partial fill is your insurance, you will need to talk to the pharmacist about payment alternatives for the remaining tablets, but again, do not accept a partial fill.  Prescription refills and/or changes in medication(s):   Prescription refills, and/or changes in dose or medication, will be conducted only during scheduled medication management appointments. (Applies to both, written and electronic prescriptions.)  No refills on procedure days. No medication will be changed or started on procedure days. No changes, adjustments, and/or refills will be conducted on a procedure day. Doing so will interfere with the diagnostic portion of the procedure.  No phone refills. No medications will be "called into the pharmacy".  No Fax refills.  No weekend refills.  No Holliday refills.  No after hours  refills.  Remember:  Business hours are:  Monday to Thursday 8:00 AM to 4:00 PM Provider's Schedule: Vinnie Gombert, MD - Appointments are:  Medication management: Monday and Wednesday 8:00 AM to 4:00 PM Procedure day: Tuesday and Thursday 7:30 AM to 4:00 PM Bilal Lateef, MD - Appointments are:  Medication management: Tuesday and Thursday 8:00 AM to 4:00 PM Procedure day: Monday and Wednesday 7:30 AM to 4:00 PM (Last update: 01/27/2018) ____________________________________________________________________________________________   ____________________________________________________________________________________________  CANNABIDIOL (AKA: CBD Oil or Pills)  Applies to: All patients receiving prescriptions of controlled substances (written and/or electronic).  General Information: Cannabidiol (CBD) was discovered in 1940. It is one of some 113 identified cannabinoids in cannabis (Marijuana) plants, accounting for up to 40% of the plant's extract. As of 2018, preliminary clinical research on cannabidiol included studies of anxiety, cognition, movement disorders, and pain.  Cannabidiol is consummed in multiple ways, including inhalation of cannabis smoke or vapor, as an aerosol spray into the cheek, and by mouth. It may be supplied as CBD oil containing CBD as the active ingredient (no added tetrahydrocannabinol (THC) or terpenes), a full-plant CBD-dominant hemp extract oil, capsules, dried cannabis, or as a liquid solution. CBD is thought not have the same psychoactivity as THC, and may affect the actions of THC. Studies suggest that CBD may interact with different biological targets, including cannabinoid receptors and other neurotransmitter receptors. As of 2018 the mechanism of action for its biological effects has not been determined.  In the [place], cannabidiol has a limited approval by the Food and Drug Administration (FDA) for treatment of only two types of epilepsy  disorders. The side effects of long-term use of the drug include somnolence, decreased appetite, diarrhea,   fatigue, malaise, weakness, sleeping problems, and others.  CBD remains a Schedule I drug prohibited for any use.  Legality: Some manufacturers ship CBD products nationally, an illegal action which the FDA has not enforced in 2018, with CBD remaining the subject of an FDA investigational new drug evaluation, and is not considered legal as a dietary supplement or food ingredient as of December 2018. Federal illegality has made it difficult historically to conduct research on CBD. CBD is openly sold in head shops and health food stores in some states where such sales have not been explicitly legalized.  Warning: Because it is not FDA approved for general use or treatment of pain, it is not required to undergo the same manufacturing controls as prescription drugs.  This means that the available cannabidiol (CBD) may be contaminated with THC.  If this is the case, it will trigger a positive urine drug screen (UDS) test for cannabinoids (Marijuana).  Because a positive UDS for illicit substances is a violation of our medication agreement, your opioid analgesics (pain medicine) may be permanently discontinued. (Last update: 02/17/2018) ____________________________________________________________________________________________    

## 2019-12-14 NOTE — Progress Notes (Signed)
Nursing Pain Medication Assessment:  Safety precautions to be maintained throughout the outpatient stay will include: orient to surroundings, keep bed in low position, maintain call bell within reach at all times, provide assistance with transfer out of bed and ambulation.  Medication Inspection Compliance: Pill count conducted under aseptic conditions, in front of the patient. Neither the pills nor the bottle was removed from the patient's sight at any time. Once count was completed pills were immediately returned to the patient in their original bottle.  Medication #1: diazepam 5mg  Pill/Patch Count: 54 of 80 pills remain Pill/Patch Appearance: Markings consistent with prescribed medication Bottle Appearance: Standard pharmacy container. Clearly labeled. Filled Date: 1 / 12/21 Last Medication intake:  Day before yesterday  Medication #2: Daizepam 2mg  Pill/Patch Count: 100 of 270 pills remain Pill/Patch Appearance: Markings consistent with prescribed medication Bottle Appearance: Standard pharmacy container. Clearly labeled. Filled Date: 21 / 58 / 2020 Last Medication intake:  Day before yesterday    Total of 154 tablets wasted in drug waste bin by Hart Rochester RNC, Pollyann Kennedy RN and witnessed by patient Guilford Shi.

## 2019-12-18 ENCOUNTER — Ambulatory Visit: Payer: Medicaid Other | Admitting: Gastroenterology

## 2019-12-18 LAB — TOXASSURE SELECT 13 (MW), URINE

## 2019-12-19 ENCOUNTER — Ambulatory Visit (INDEPENDENT_AMBULATORY_CARE_PROVIDER_SITE_OTHER): Payer: Medicaid Other | Admitting: Gastroenterology

## 2019-12-19 DIAGNOSIS — D500 Iron deficiency anemia secondary to blood loss (chronic): Secondary | ICD-10-CM

## 2019-12-19 DIAGNOSIS — K5909 Other constipation: Secondary | ICD-10-CM | POA: Diagnosis not present

## 2019-12-19 DIAGNOSIS — K5903 Drug induced constipation: Secondary | ICD-10-CM

## 2019-12-19 DIAGNOSIS — D509 Iron deficiency anemia, unspecified: Secondary | ICD-10-CM

## 2019-12-19 DIAGNOSIS — K219 Gastro-esophageal reflux disease without esophagitis: Secondary | ICD-10-CM

## 2019-12-19 DIAGNOSIS — Z8711 Personal history of peptic ulcer disease: Secondary | ICD-10-CM

## 2019-12-19 MED ORDER — FUSION PLUS PO CAPS: 1.0000 | ORAL_CAPSULE | Freq: Every day | ORAL | 2 refills | Status: AC

## 2019-12-19 MED ORDER — LINACLOTIDE 290 MCG PO CAPS: 290.0000 ug | ORAL_CAPSULE | Freq: Every day | ORAL | 2 refills | Status: DC

## 2019-12-19 NOTE — Progress Notes (Signed)
Chelsea Sear, MD [address]  [place]  [place], [place] [address]  Main: [phone]  Fax: [phone]    Gastroenterology Consultation Tele Visit  Referring Provider:     Paulene Floor Primary Care Physician:  Paulene Floor Primary Gastroenterologist:  Dr. Bonna Gains Reason for Consultation:    Chronic constipation, epigastric pain, GERD        HPI:   Chelsea Ramsey is a 54 y.o. female referred by Dr. Trinna Post, PA-C  for consultation & management of chronic constipation, epigastric pain, heartburn  Virtual Visit via Telephone Note  I connected with Chelsea Ramsey on 12/19/19 at  1:00 PM EST by telephone and verified that I am speaking with the correct person using two identifiers.   I discussed the limitations, risks, security and privacy concerns of performing an evaluation and management service by telephone and the availability of in person appointments. I also discussed with the patient that there may be a patient responsible charge related to this service. The patient expressed understanding and agreed to proceed.  Location of the Patient: Home  Location of the provider: Work  Persons participating in the visit: Patient and provider only  History of Present Illness:   HPI: Chelsea Ramsey is a 54 y.o. female with history of paroxysmal atrial fibrillation, status post catheter ablation, on Xarelto. 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 She has chronic exertional dyspnea with underlying COPD on supplemental home oxygen, sleep apnea, obesity, and ongoing tobacco abuse. Patient is followed by Dr. Bonna Gains for GERD, epigastric pain and chronic constipation.  She had history of gastric ulcers, recently underwent upper endoscopy which confirmed healing of ulcers and there was no evidence of H. pylori.  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  She also has  chronic constipation for which she is currently taking Linzess 145 MCG daily and patient reports that it is not helping.  She is off other stool softeners since she started taking Linzess.  She does drink sodas every day, taking omeprazole 40 mg in the morning and at bedtime, lately experiencing severe heartburn during the day.    With regards to her obesity, patient said she was in cardiac rehab and could not tolerate physical activity and she was oxygen dependent.  She also reported that weight loss surgery was considered in the past when she was at [facility]  Her physical activity is very limited due to chronic back pain and underlying COPD, CHF.  She does have mild iron deficiency anemia, oral iron caused severe constipation in the past  NSAIDs: None  Antiplts/Anticoagulants/Anti thrombotics: Xarelto for history of A. fib  GI Procedures:  EGD and colonoscopy 11/06/2019  - Mucosal changes in the esophagus. Biopsied. - Erythematous mucosa in the antrum. Biopsied. - Normal duodenal bulb, second portion of the duodenum and examined duodenum. - Biopsies were obtained in the gastric body, at the incisura and in the gastric antrum.  - Preparation of the colon was poor. - Stool in the entire examined colon. - Diverticulosis in the sigmoid colon. - The examination was otherwise normal. - The distal rectum and anal verge are normal on retroflexion view. - No specimens collected.  DIAGNOSIS:  A. STOMACH ERYTHEMA; COLD BIOPSY:  - MILD REACTIVE GASTROPATHY.  - NEGATIVE FOR ACTIVE INFLAMMATION, H. PYLORI, INTESTINAL METAPLASIA,  DYSPLASIA, AND MALIGNANCY.   B. ESOPHAGUS; COLD BIOPSY:  - STRATIFIED SQUAMOUS EPITHELIUM WITHOUT EOSINOPHILS, NEUTROPHILS, OR  REACTIVE CHANGES.  - DETACHED STRIPS OF PARAKERATOTIC SURFACE EPITHELIUM COMPATIBLE WITH  ESOPHAGITIS DISSECANS SUPERFICIALIS, SEE COMMENT.  - NEGATIVE FOR DYSPLASIA AND MALIGNANCY.     Past Medical History:  Diagnosis Date  .  Acute drug-induced gout of left foot 03/01/2018   Last Assessment & Plan:  Likely at least partially brought on by diuresis.  Needs to continue to diurese  Will push hydration Stop allopurinol given initiation during acute flare may worsen this, re-broach this when asymptomatic Avoid nsaids given stomach pain Trial colchicine Add acetaminophen Ice, elevate, rest  . Allergy    seasonal  . Anxiety   . Arthritis    Right Knee  . Asthma   . CHF (congestive heart failure) ([place])   . COPD (chronic obstructive pulmonary disease) ([place])   . Coronary artery disease    Leaky heart valve  . Dysrhythmia   . Fibromyalgia   . GERD (gastroesophageal reflux disease)   . Hypertension   . Pneumonia   . PUD (peptic ulcer disease)   . Pulmonary HTN ([place])   . Rheumatic fever/heart disease   . Sleep apnea     Past Surgical History:  Procedure Laterality Date  . ADENOIDECTOMY    . COLONOSCOPY WITH PROPOFOL N/A 11/06/2019   Procedure: COLONOSCOPY WITH PROPOFOL;  Surgeon: Virgel Manifold, MD;  Location: [facility] ENDOSCOPY;  Service: Endoscopy;  Laterality: N/A;  2 day prep   . CORONARY ARTERY BYPASS GRAFT     June 27 2018  . ESOPHAGOGASTRODUODENOSCOPY (EGD) WITH PROPOFOL N/A 05/25/2018   Procedure: ESOPHAGOGASTRODUODENOSCOPY (EGD) WITH PROPOFOL;  Surgeon: Lucilla Lame, MD;  Location[facility] ENDOSCOPY;  Service: Endoscopy;  Laterality: N/A;  . ESOPHAGOGASTRODUODENOSCOPY (EGD) WITH PROPOFOL N/A 11/06/2019   Procedure: ESOPHAGOGASTRODUODENOSCOPY (EGD) WITH PROPOFOL;  Surgeon: Virgel Manifold, MD;  Location: [facility] ENDOSCOPY;  Service: Endoscopy;  Laterality: N/A;  . MITRAL VALVE REPLACEMENT    . MULTIPLE TOOTH EXTRACTIONS    . TONSILLECTOMY    . TOTAL KNEE ARTHROPLASTY Right 09/04/2016   Procedure: TOTAL KNEE ARTHROPLASTY; with lateral release;  Surgeon: Earlie Server, MD;  Location: [place];  Service: Orthopedics;  Laterality: Right;    Current Outpatient Medications:  .  albuterol (PROAIR HFA) 108 (90 Base)  MCG/ACT inhaler, Inhale 1-2 puffs into the lungs every 6 (six) hours as needed for wheezing or shortness of breath., Disp: , Rfl:  .  allopurinol (ZYLOPRIM) 100 MG tablet, Take 1 tablet (100 mg total) by mouth 2 (two) times daily., Disp: 60 tablet, Rfl: 5 .  butalbital-acetaminophen-caffeine (FIORICET, ESGIC) 50-325-40 MG tablet, Take 1 tablet by mouth every 6 (six) hours as needed. , Disp: , Rfl:  .  Calcium Carbonate-Vit D-Min (GNP CALCIUM 1200) 1200-1000 MG-UNIT CHEW, Chew 1,200 mg by mouth daily with breakfast. Take in combination with vitamin D and magnesium., Disp: 90 tablet, Rfl: 3 .  Cholecalciferol (D 5000) 125 MCG (5000 UT) capsule, Take by mouth daily. , Disp: , Rfl:  .  colchicine 0.6 MG tablet, Take 1 tablet (0.6 mg total) by mouth daily. As directed, Disp: 30 tablet, Rfl: 5 .  diazepam (VALIUM) 5 MG tablet, Take by mouth., Disp: , Rfl:  .  docusate sodium (COLACE) 100 MG capsule, Take 200 mg by mouth daily., Disp: , Rfl:  .  furosemide (LASIX) 40 MG tablet, Take 2 tablets in the am and 1 tablet at night., Disp: 90 tablet, Rfl: 5 .  gabapentin (NEURONTIN) 600 MG tablet, Take 0.5 tablets (300 mg total) by mouth 2 (two)  times daily. (Patient taking differently: Take 600 mg by mouth 3 (three) times daily. ), Disp: 60 tablet, Rfl: 0 .  ipratropium-albuterol (DUONEB) 0.5-2.5 (3) MG/3ML SOLN, INHALE THE CONTENTS OF 1 VIAL(3MLS) VIA NEBULIZER 3 TIMES DAILY AS NEEDED, Disp: 360 mL, Rfl: 1 .  Iron-FA-B Cmp-C-Biot-Probiotic (FUSION PLUS) CAPS, Take 1 capsule by mouth daily., Disp: 30 capsule, Rfl: 2 .  lactulose (CHRONULAC) 10 GM/15ML solution, Take 45 mLs (30 g total) by mouth 2 (two) times daily as needed for mild constipation., Disp: 240 mL, Rfl: 0 .  linaclotide (LINZESS) 290 MCG CAPS capsule, Take 1 capsule (290 mcg total) by mouth daily before breakfast., Disp: 30 capsule, Rfl: 2 .  meclizine (ANTIVERT) 12.5 MG tablet, TAKE 2 TABLETS (25 MG TOTAL) BY MOUTH 3 TIMES DAILY AS NEEDED FOR  DIZZINESS, Disp: 60 tablet, Rfl: 0 .  metolazone (ZAROXOLYN) 2.5 MG tablet, Take 2.5 mg by mouth. Three times weekly, Disp: , Rfl:  .  metoprolol tartrate (LOPRESSOR) 100 MG tablet, Take 1 tablet (100 mg total) by mouth 2 (two) times daily., Disp: 60 tablet, Rfl: 3 .  nitroGLYCERIN (NITROSTAT) 0.4 MG SL tablet, Place 1 tablet (0.4 mg total) under the tongue every 5 (five) minutes as needed for chest pain., Disp: 30 tablet, Rfl: 12 .  nortriptyline (PAMELOR) 10 MG capsule, Take 20 mg by mouth at bedtime. , Disp: , Rfl:  .  omeprazole (PRILOSEC) 20 MG capsule, TAKE 2 TABLETS BY MOUTH TWICE A DAY BEFORE A MEAL, Disp: 120 capsule, Rfl: 0 .  [START ON 12/23/2019] Oxycodone HCl 10 MG TABS, Take 0.5-1 tablets (5-10 mg total) by mouth 2 (two) times daily as needed. Must last 30 days. MAX.: 2/day, Disp: 60 tablet, Rfl: 0 .  OXYGEN, Inhale 4 L/L into the lungs., Disp: , Rfl:  .  potassium chloride SA (KLOR-CON) 20 MEQ tablet, Take 2 tablets (40 mEq total) by mouth 2 (two) times daily. And additional tablet when taking metolazone, Disp: 130 tablet, Rfl: 5 .  promethazine (PHENERGAN) 12.5 MG tablet, TAKE 1 TABLET BY MOUTH EVERY 6 HOURS AS NEEDED FOR NAUSEA OR VOMITING, Disp: 30 tablet, Rfl: 0 .  rivaroxaban (XARELTO) 20 MG TABS tablet, Take 20 mg by mouth daily with supper., Disp: , Rfl:  .  zolpidem (AMBIEN) 5 MG tablet, Take 5 mg by mouth at bedtime as needed for sleep., Disp: , Rfl:    Family History  Problem Relation Age of Onset  . COPD Mother   . Cancer Mother        Bone  . Asthma Mother   . Rheum arthritis Mother   . Congestive Heart Failure Father   . Breast cancer Maternal Aunt      Social History   Tobacco Use  . Smoking status: Current Every Day Smoker    Packs/day: 0.10    Types: Cigarettes  . Smokeless tobacco: Never Used  . Tobacco comment: 4/20 was not able to quit.  Still at 1-3 a day. She would like to wait to set a new quit date until we get back to class to have the extra in  person support  Substance Use Topics  . Alcohol use: Never    Alcohol/week: 3.0 standard drinks    Types: 3 Cans of beer per week    Comment: 16 oz per week  . Drug use: Not Currently    Comment: Previous use of cocaine and marijuana last use 07/31/16     Allergies as of 12/19/2019 - Review  Complete 12/12/2019  Allergen Reaction Noted  . Amiodarone Nausea And Vomiting 07/17/2018  . Aspirin Swelling 06/08/2015  . Flexeril [cyclobenzaprine] Swelling 07/05/2015  . Trazamine [trazodone & diet manage prod] Nausea And Vomiting 08/19/2016  . Codeine Rash 06/08/2015  . Tramadol Rash 06/08/2015     Imaging Studies: Reviewed  Assessment and Plan:   Chelsea Ramsey is a 54 y.o. female with history of mitral valve replacement with bioprosthetic valve, paroxysmal A. fib s/p ablation on Xarelto, congestive heart failure, COPD on home oxygen, chronic tobacco use is here for follow-up of chronic constipation, epigastric pain, heartburn  Chronic constipation Strongly advised to avoid all carbonated beverages, sugary drinks including fruit juices, sweetened tea Advised her about high-fiber diet Adequate intake of water Increase Linzess to 290 MCG, prescription sent  GERD/epigastric pain EGD revealed esophagitis dissecans superficialis which is a benign, nonprecancerous condition does not typically need any treatment No evidence of H. pylori Advised her to take omeprazole 40 mg before breakfast and before dinner Healthy eating habits  Morbid obesity Will send her information about [facility] wellness program via MyChart  Iron deficiency anemia, mild EGD and colonoscopy unremarkable Trial of fusion plus for 1 month, recheck studies during next visit Consider VCE  Colon cancer screening, poor prep in the past Recommend colonoscopy with 2-day prep   Follow Up Instructions:   I discussed the assessment and treatment plan with the patient. The patient was provided an  opportunity to ask questions and all were answered. The patient agreed with the plan and demonstrated an understanding of the instructions.   The patient was advised to call back or seek an in-person evaluation if the symptoms worsen or if the condition fails to improve as anticipated.  I provided 25 minutes of non-face-to-face time during this encounter.   Follow up in 3 months with Dr. Yevette Edwards, MD

## 2019-12-19 NOTE — Progress Notes (Deleted)
Chelsea Darby, MD [address]  [place]  [place], [place] [address]  Main: [phone]  Fax: [phone] Pager: [phone]   Primary Care Physician: Paulene Floor  Primary Gastroenterologist:  Dr. Bonna Gains  Chief Complaint  Patient presents with  . Follow-up  . Heartburn  . Constipation  . Abdominal Pain      Current Outpatient Medications  Medication Sig Dispense Refill  . albuterol (PROAIR HFA) 108 (90 Base) MCG/ACT inhaler Inhale 1-2 puffs into the lungs every 6 (six) hours as needed for wheezing or shortness of breath.    . allopurinol (ZYLOPRIM) 100 MG tablet Take 1 tablet (100 mg total) by mouth 2 (two) times daily. 60 tablet 5  . butalbital-acetaminophen-caffeine (FIORICET, ESGIC) 50-325-40 MG tablet Take 1 tablet by mouth every 6 (six) hours as needed.     . Calcium Carbonate-Vit D-Min (GNP CALCIUM 1200) 1200-1000 MG-UNIT CHEW Chew 1,200 mg by mouth daily with breakfast. Take in combination with vitamin D and magnesium. 90 tablet 3  . Cholecalciferol (D 5000) 125 MCG (5000 UT) capsule Take by mouth daily.     . colchicine 0.6 MG tablet Take 1 tablet (0.6 mg total) by mouth daily. As directed 30 tablet 5  . diazepam (VALIUM) 5 MG tablet Take by mouth.    . docusate sodium (COLACE) 100 MG capsule Take 200 mg by mouth daily.    . furosemide (LASIX) 40 MG tablet Take 2 tablets in the am and 1 tablet at night. 90 tablet 5  . gabapentin (NEURONTIN) 600 MG tablet Take 0.5 tablets (300 mg total) by mouth 2 (two) times daily. (Patient taking differently: Take 600 mg by mouth 3 (three) times daily. ) 60 tablet 0  . ipratropium-albuterol (DUONEB) 0.5-2.5 (3) MG/3ML SOLN INHALE THE CONTENTS OF 1 VIAL(3MLS) VIA NEBULIZER 3 TIMES DAILY AS NEEDED 360 mL 1  . lactulose (CHRONULAC) 10 GM/15ML solution Take 45 mLs (30 g total) by mouth 2 (two) times daily as needed for mild constipation. 240 mL 0  . LINZESS 145 MCG CAPS capsule TAKE 1 CAPSULE BY MOUTH ONCE DAILY  BEFORE BREAKFAST. 30 capsule 1  . meclizine (ANTIVERT) 12.5 MG tablet TAKE 2 TABLETS (25 MG TOTAL) BY MOUTH 3 TIMES DAILY AS NEEDED FOR DIZZINESS 60 tablet 0  . metolazone (ZAROXOLYN) 2.5 MG tablet Take 2.5 mg by mouth. Three times weekly    . metoprolol tartrate (LOPRESSOR) 100 MG tablet Take 1 tablet (100 mg total) by mouth 2 (two) times daily. 60 tablet 3  . nitroGLYCERIN (NITROSTAT) 0.4 MG SL tablet Place 1 tablet (0.4 mg total) under the tongue every 5 (five) minutes as needed for chest pain. 30 tablet 12  . nortriptyline (PAMELOR) 10 MG capsule Take 20 mg by mouth at bedtime.     Marland Kitchen omeprazole (PRILOSEC) 20 MG capsule TAKE 2 TABLETS BY MOUTH TWICE A DAY BEFORE A MEAL 120 capsule 0  . [START ON 12/23/2019] Oxycodone HCl 10 MG TABS Take 0.5-1 tablets (5-10 mg total) by mouth 2 (two) times daily as needed. Must last 30 days. MAX.: 2/day 60 tablet 0  . OXYGEN Inhale 4 L/L into the lungs.    . potassium chloride SA (KLOR-CON) 20 MEQ tablet Take 2 tablets (40 mEq total) by mouth 2 (two) times daily. And additional tablet when taking metolazone 130 tablet 5  . promethazine (PHENERGAN) 12.5 MG tablet TAKE 1 TABLET BY MOUTH EVERY 6 HOURS AS NEEDED FOR NAUSEA OR VOMITING 30 tablet 0  .  rivaroxaban (XARELTO) 20 MG TABS tablet Take 20 mg by mouth daily with supper.    . zolpidem (AMBIEN) 5 MG tablet Take 5 mg by mouth at bedtime as needed for sleep.     No current facility-administered medications for this visit.    Allergies as of 12/19/2019 - Review Complete 12/12/2019  Allergen Reaction Noted  . Amiodarone Nausea And Vomiting 07/17/2018  . Aspirin Swelling 06/08/2015  . Flexeril [cyclobenzaprine] Swelling 07/05/2015  . Trazamine [trazodone & diet manage prod] Nausea And Vomiting 08/19/2016  . Codeine Rash 06/08/2015  . Tramadol Rash 06/08/2015    NSAIDs: None  Antiplts/Anticoagulants/Anti thrombotics: Xarelto for history of A. fib  GI procedures:  EGD and colonoscopy 11/06/2019  - Mucosal  changes in the esophagus. Biopsied. - Erythematous mucosa in the antrum. Biopsied. - Normal duodenal bulb, second portion of the duodenum and examined duodenum. - Biopsies were obtained in the gastric body, at the incisura and in the gastric antrum.  - Preparation of the colon was poor. - Stool in the entire examined colon. - Diverticulosis in the sigmoid colon. - The examination was otherwise normal. - The distal rectum and anal verge are normal on retroflexion view. - No specimens collected.  DIAGNOSIS:  A. STOMACH ERYTHEMA; COLD BIOPSY:  - MILD REACTIVE GASTROPATHY.  - NEGATIVE FOR ACTIVE INFLAMMATION, H. PYLORI, INTESTINAL METAPLASIA,  DYSPLASIA, AND MALIGNANCY.   B. ESOPHAGUS; COLD BIOPSY:  - STRATIFIED SQUAMOUS EPITHELIUM WITHOUT EOSINOPHILS, NEUTROPHILS, OR  REACTIVE CHANGES.  - DETACHED STRIPS OF PARAKERATOTIC SURFACE EPITHELIUM COMPATIBLE WITH  ESOPHAGITIS DISSECANS SUPERFICIALIS, SEE COMMENT.  - NEGATIVE FOR DYSPLASIA AND MALIGNANCY.   ROS:  General: Negative for anorexia, weight loss, fever, chills, fatigue, weakness. ENT: Negative for hoarseness, difficulty swallowing , nasal congestion. CV: Negative for chest pain, angina, palpitations, + dyspnea on exertion, + peripheral edema.  Respiratory: Positive dyspnea at rest, dyspnea on exertion, negative cough, sputum, wheezing.  GI: See history of present illness. GU:  Negative for dysuria, hematuria, urinary incontinence, urinary frequency, nocturnal urination.  Endo: Negative for unusual weight change.    Physical Examination:   LMP 11/08/2009 Comment: menopause  General: Well-nourished, well-developed in no acute distress.  Eyes: No icterus. Conjunctivae pink. Mouth: Oropharyngeal mucosa moist and pink , no lesions erythema or exudate. Lungs: Clear to auscultation bilaterally. Non-labored. Heart: Regular rate and rhythm, no murmurs rubs or gallops.  Abdomen: Bowel sounds are normal, nontender, nondistended, no  hepatosplenomegaly or masses, no hernia , no rebound or guarding.   Extremities: No lower extremity edema. No clubbing or deformities. Neuro: Alert and oriented x 3.  Grossly intact. Skin: Warm and dry, no jaundice.   Psych: Alert and cooperative, normal mood and affect.   Imaging Studies: No results found.  Assessment and Plan:   Chelsea Ramsey is a 54 y.o. female ***  Follow up in ***   Dr Sherri Sear, MD

## 2019-12-20 ENCOUNTER — Other Ambulatory Visit: Payer: Self-pay

## 2019-12-20 DIAGNOSIS — Z1211 Encounter for screening for malignant neoplasm of colon: Secondary | ICD-10-CM

## 2020-01-01 ENCOUNTER — Other Ambulatory Visit: Payer: Self-pay

## 2020-01-01 DIAGNOSIS — R42 Dizziness and giddiness: Secondary | ICD-10-CM

## 2020-01-01 NOTE — Telephone Encounter (Signed)
Copied from [place] [id number]. Topic: General - Other >> Jan 01, 2020  2:46 PM Mcneil, Ja-Kwan wrote: Reason for CRM: Pt stated that she is completely out of the meclizine (ANTIVERT) 12.5 MG tablet and she can not go without it because it is for Vertigo. Pt declined to schedule an appt due to already having an appt scheduled for 03/27/20. Pt requests call back

## 2020-01-02 ENCOUNTER — Other Ambulatory Visit: Payer: Self-pay | Admitting: Physician Assistant

## 2020-01-02 DIAGNOSIS — R42 Dizziness and giddiness: Secondary | ICD-10-CM

## 2020-01-02 NOTE — Telephone Encounter (Signed)
She has been referred to ENT for this issue and back to Dr. Melrose Nakayama. She was last seen by him in December for dizziness and had her valium increased. I had refilled this medication until which time she could be evaluated by a specialist, which she has been in the interim. This medication has been refilled by neurology on 01/01/2020. She needs to not request this medication from this clinic anymore and request it from the specialist who is monitoring the issue because they are the most up to date on any medication changes and treatment plans. She also needs to follow up with her neurologist for any more refills as it states in the phone note with [facility] clinic.

## 2020-01-08 ENCOUNTER — Other Ambulatory Visit
Admission: RE | Admit: 2020-01-08 | Discharge: 2020-01-08 | Disposition: A | Discharge status: Home / Self Care | Payer: Medicaid Other | Source: Ambulatory Visit | Attending: Physician Assistant | Admitting: Physician Assistant

## 2020-01-08 DIAGNOSIS — Z8711 Personal history of peptic ulcer disease: Secondary | ICD-10-CM

## 2020-01-08 DIAGNOSIS — Z79899 Other long term (current) drug therapy: Secondary | ICD-10-CM | POA: Insufficient documentation

## 2020-01-08 DIAGNOSIS — Z8719 Personal history of other diseases of the digestive system: Secondary | ICD-10-CM

## 2020-01-08 DIAGNOSIS — R1310 Dysphagia, unspecified: Secondary | ICD-10-CM

## 2020-01-08 DIAGNOSIS — R1319 Other dysphagia: Secondary | ICD-10-CM

## 2020-01-08 DIAGNOSIS — K3189 Other diseases of stomach and duodenum: Secondary | ICD-10-CM

## 2020-01-08 LAB — BRAIN NATRIURETIC PEPTIDE: B Natriuretic Peptide: 284 pg/mL — ABNORMAL HIGH (ref 0.0–100.0)

## 2020-01-10 ENCOUNTER — Telehealth: Payer: Self-pay | Admitting: *Deleted

## 2020-01-10 NOTE — Telephone Encounter (Signed)
Patient returned my call for appt with Dr Holley Raring tomorrow.  She states that she is a Engineer, materials patient.  Looked at last visit on 12/14/19 and that was with Dr Dossie Arbour, the plan was to see her back in 1 month.  Transferred up front to correct scheduling error and get her onto Dr Adalberto Cole schedule.

## 2020-01-11 ENCOUNTER — Telehealth: Payer: Medicaid Other | Admitting: Student in an Organized Health Care Education/Training Program

## 2020-01-11 ENCOUNTER — Encounter: Payer: Self-pay | Admitting: Pain Medicine

## 2020-01-12 ENCOUNTER — Other Ambulatory Visit: Admission: RE | Admit: 2020-01-12 | Payer: Medicaid Other | Source: Ambulatory Visit

## 2020-01-14 NOTE — Progress Notes (Signed)
Patient: Chelsea Ramsey  Service Category: E/M  Provider: Gaspar Cola, MD  DOB: Jan 01, 1966  DOS: 01/15/2020  Location: Office  MRN: [id number]  Setting: Ambulatory outpatient  Referring Provider: Trinna Post, PA-C  Type: Established Patient  Specialty: Interventional Pain Management  PCP: Trinna Post, PA-C  Location: Remote location  Delivery: TeleHealth     Virtual Encounter - Pain Management PROVIDER NOTE: Information contained herein reflects review and annotations entered in association with encounter. Interpretation of such information and data should be left to medically-trained personnel. Information provided to patient can be located elsewhere in the medical [facility] under "Patient Instructions". Document created using STT-dictation technology, any transcriptional errors that may result from process are unintentional.    Contact & Pharmacy Preferred: [phone] Home: [phone] (home) Mobile: [phone] (mobile) E-mail: [email or web address]  [facility] [place] [facility] [place] [address] [place] [address] Phone: [phone] Fax: [phone]   Pre-screening  Ms. Neault offered "in-person" vs "virtual" encounter. She indicated preferring virtual for this encounter.   Reason COVID-19*  Social distancing based on CDC and AMA recommendations.   I contacted Chelsea Ramsey on 01/15/2020 via telephone.      I clearly identified myself as Gaspar Cola, MD. I verified that I was speaking with the correct person using two identifiers (Name: Trenisha Lafavor, and date of birth: 1966/10/10).  Consent I sought verbal advanced consent from Chelsea Ramsey for virtual visit interactions. I informed Ms. Zambito of possible security and privacy concerns, risks, and limitations associated with providing "not-in-person" medical evaluation and management services. I also informed Ms.  Moll of the availability of "in-person" appointments. Finally, I informed her that there would be a charge for the virtual visit and that she could be  personally, fully or partially, financially responsible for it. Ms. Zuk expressed understanding and agreed to proceed.   Historic Elements   Ms. Caterin Tabares is a 54 y.o. year old, female patient evaluated today after her last contact with our practice on 01/10/2020. Ms. Heinzelman  has a past medical history of Acute drug-induced gout of left foot (03/01/2018), Allergy, Anxiety, Arthritis, Asthma, CHF (congestive heart failure) ([place]), COPD (chronic obstructive pulmonary disease) ([place]), Coronary artery disease, Dysrhythmia, Fibromyalgia, GERD (gastroesophageal reflux disease), Hypertension, Pneumonia, PUD (peptic ulcer disease), Pulmonary HTN ([place]), Rheumatic fever/heart disease, and Sleep apnea. 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 Ms. Womac has a current medication list which includes the following prescription(s): albuterol, allopurinol, butalbital-acetaminophen-caffeine, gnp calcium 1200, d 5000, colchicine, furosemide, gabapentin, ipratropium-albuterol, fusion plus, lactulose, linaclotide, meclizine, metolazone, metoprolol tartrate, nitroglycerin, nortriptyline, omeprazole, oxygen-helium, potassium chloride sa, promethazine, rivaroxaban, zolpidem, [START ON 01/22/2020] oxycodone, [START ON 01/29/2020] oxycodone, and [START ON 02/05/2020] oxycodone. She  reports that she has been smoking cigarettes. She has been smoking about 0.10 packs per day. She has never used smokeless tobacco. She reports  previous drug use. She reports that she does not drink alcohol. Ms. Rodak is allergic to amiodarone; aspirin; flexeril [cyclobenzaprine]; trazamine [name] & diet manage prod]; codeine; and tramadol.   HPI  Today, she is being contacted for medication management.  On the patient's last encounter, note we noticed that there were some inconsistencies with his history.  The patient chose to lied to me  about the amount of medication that she was receiving.  On that day we had her come into the clinic for a pill count and we took the opportunity to do an unannounced UDS, which proved to be very enlightening says we detected both, oxycodone and hydrocodone in the urine sample.  A review of the PMP has shown that the patient had not received any hydrocodone prescriptions from any physicians, indicating that the source of this hydrocodone was illicit.  This represents a violation of our medication agreement and therefore today we will permanently terminate prescribing any type of controlled substances to this patient.  The patient will be given the choice of continuing with interventional therapies however, she is really not a good candidate for anything containing steroids secondary to her congestive heart failure and due to her morbid obesity we cannot offer her any radiofrequency ablation until she brings her BMI down to less than 35.  Today I will provide the patient with prescriptions to accomplish a downward taper of her oxycodone so as to avoid any withdrawals.  However, this is not likely to happen since she has proven to have access to illicit substances including hydrocodone, which was not originally prescribed to her.  When I called the patient today, I notified her of the results of the UDS and I also personally informed her that we would not continue writing for any more of the oxycodone.  I informed her that I would be sending enough medication so that she could taper herself off of the oxycodone,  but we would not be prescribing this or any other controlled substance to her again.  She was not surprised that all about the findings on the UDS or the expected change in pharmacotherapy due to our findings.  Pharmacotherapy Assessment  Analgesic: No more opioids from our practice, due to medication agreement violation documented on 01/15/2020 note.  Abnormal, noncompliant UDS (12/14/2019) showing the patient to be taking illicit hydrocodone in addition to the oxycodone that we were prescribing and benzodiazepines. MME/day:  Unknown.   Monitoring: [place] PMP: PDMP reviewed during this encounter.       Pharmacotherapy: No side-effects or adverse reactions reported. Compliance: No problems identified. Effectiveness: Clinically acceptable. Plan: Refer to "POC".  UDS:  Summary  Date Value Ref Range Status  12/14/2019 Note  Final    Comment:    ==================================================================== [employer] [facility] 13 (MW) ==================================================================== Test                             Result       Flag       Units Drug Present and Declared for Prescription Verification   Desmethyldiazepam              409          EXPECTED   ng/mg creat   Oxazepam                       772          EXPECTED   ng/mg creat   Temazepam                      740          EXPECTED   ng/mg creat    Desmethyldiazepam, oxazepam, and temazepam are expected metabolites    of diazepam. Desmethyldiazepam and oxazepam are also expected    metabolites of other  drugs, including chlordiazepoxide, prazepam,    clorazepate, and halazepam. Oxazepam is an expected metabolite of    temazepam. Oxazepam and temazepam are also available as scheduled    prescription medications.   Oxycodone                      1345         EXPECTED   ng/mg creat   Oxymorphone                    487          EXPECTED   ng/mg creat   Noroxymorphone                 160          EXPECTED   ng/mg  creat    Sources of oxycodone include scheduled prescription medications.    Oxymorphone and noroxymorphone are expected metabolites of oxycodone.    Oxymorphone is also available as a scheduled prescription    medication.   Butalbital                     PRESENT      EXPECTED Drug Present not Declared for Prescription Verification   Hydrocodone                    966          UNEXPECTED ng/mg creat   Hydromorphone                  164          UNEXPECTED ng/mg creat   Dihydrocodeine                 481          UNEXPECTED ng/mg creat   Norhydrocodone                 1358         UNEXPECTED ng/mg creat    Sources of hydrocodone include scheduled prescription medications.    Hydromorphone, dihydrocodeine and norhydrocodone are expected    metabolites of hydrocodone. Hydromorphone and dihydrocodeine are    also available as scheduled prescription medications. ==================================================================== Test                      Result    Flag   Units      Ref Range   Creatinine              53               mg/dL      >=20 ==================================================================== Declared Medications:  The flagging and interpretation on this report are based on the  following declared medications.  Unexpected results may arise from  inaccuracies in the declared medications.  **Note: The testing scope of this panel includes these medications:  Butalbital (Fioricet)  Diazepam  Oxycodone  **Note: The testing scope of this panel does not include the  following reported medications:  Acetaminophen (Fioricet)  Albuterol  Albuterol (Duoneb)  Allopurinol  Caffeine (Fioricet)  Calcium  Colchicine  Docusate  Furosemide  Gabapentin  Helium  Ipratropium (Duoneb)  Lactulose  Linaclotide (Linzess)  Meclizine  Metolazone  Metoprolol  Nitroglycerin  Nortriptyline  Omeprazole  Oxygen  Potassium  Promethazine  Rivaroxaban  Vitamin D   Zolpidem ==================================================================== For clinical consultation, please call [phone]. ====================================================================    Laboratory Chemistry Profile  Renal Lab Results  Component Value Date   BUN 19 10/02/2019   CREATININE 0.67 10/02/2019   BCR 18 10/10/2018   GFRAA >60 10/02/2019   GFRNONAA >60 10/02/2019    Hepatic Lab Results  Component Value Date   AST 21 08/24/2019   ALT 17 08/24/2019   ALBUMIN 4.0 08/24/2019   ALKPHOS 138 (H) 08/24/2019   LIPASE 37 12/01/2018    Electrolytes Lab Results  Component Value Date   NA 138 10/02/2019   K 3.1 (L) 10/02/2019   CL 94 (L) 10/02/2019   CALCIUM 9.0 10/02/2019   MG 2.1 12/03/2018   PHOS 4.1 12/03/2018    Bone Lab Results  Component Value Date   25OHVITD1 38 12/19/2018   25OHVITD2 33 12/19/2018   25OHVITD3 4.7 12/19/2018    Coagulation Lab Results  Component Value Date   INR 1.24 11/17/2018   LABPROT 15.5 (H) 11/17/2018   APTT 26 05/22/2018   PLT 298 08/24/2019    Cardiovascular Lab Results  Component Value Date   BNP 284.0 (H) 01/08/2020   CKTOTAL 39 12/17/2013   CKMB 0.9 03/28/2013   TROPONINI <0.03 12/02/2018   HGB 11.7 (L) 08/24/2019   HCT 38.0 08/24/2019    Inflammation (CRP: Acute Phase) (ESR: Chronic Phase) Lab Results  Component Value Date   CRP 11 (H) 10/10/2018   ESRSEDRATE 79 (H) 10/10/2018      Note: Above Lab results reviewed.  Imaging  MR BRAIN WO CONTRAST CLINICAL DATA:  54 year old female with headaches, dizziness, memory loss and visual disturbance since July 2019 following open heart surgery.  EXAM: MRI HEAD WITHOUT CONTRAST  TECHNIQUE: Multiplanar, multiecho pulse sequences of the brain and surrounding structures were obtained without intravenous contrast.  COMPARISON:  Brain MRI 10/11/2018 and earlier.  FINDINGS: Brain: No restricted diffusion to suggest acute infarction. No midline  shift, mass effect, evidence of mass lesion, ventriculomegaly, extra-axial collection or acute intracranial hemorrhage. Cervicomedullary junction and pituitary are within normal limits.  Cerebral volume remains normal for age. Pearline Cables and white matter signal throughout the brain is stable and largely normal for age.  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  No definite chronic blood products.  No new signal abnormality.  Vascular: Major intracranial vascular flow voids are stable.  Skull and upper cervical spine: Negative visible cervical spine. Visualized bone marrow signal is within normal limits.  Sinuses/Orbits: Chronic left orbital floor fracture with herniated intraorbital fat. See series 17, image 23. Otherwise negative orbits and paranasal sinuses.  Other: Mastoids remain clear. Visible internal auditory structures appear normal. Scalp and face soft tissues appear negative.  IMPRESSION: 1.  No acute intracranial abnormality. Stable noncontrast MRI appearance of the brain since 2015, negative for age aside from a small chronic left cerebellar PICA infarct.  2. 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 Note that occasionally such an injury can be associated with dysfunction of the Oculocardiac Reflex.  Electronically Signed   By: Genevie Ann M.D.   On: 07/23/2019 16:26  Assessment  The primary encounter diagnosis was Chronic pain syndrome. Diagnoses of Chronic low back pain (Primary Area of Pain) (Bilateral) (L>R) w/o sciatica, Chronic lower extremity pain (Secondary Area of Pain) (Left), Non compliance w medication regimen, Pain medication agreement broken, History of illicit drug use, and Misuse of prescription only drugs were also pertinent to this visit.  Plan of Care  Problem-specific:  No problem-specific  Assessment & Plan notes found for this encounter.  I have discontinued Edyn C. Bezdek "[name]"'s docusate sodium, diazepam, and Oxycodone HCl. I am also having her start on oxyCODONE, oxyCODONE, and oxyCODONE. Additionally, I am having her maintain her lactulose, nitroGLYCERIN, gabapentin, butalbital-acetaminophen-caffeine, metoprolol tartrate, nortriptyline, metolazone, albuterol, D 5000, allopurinol, colchicine, GNP Calcium 1200, potassium chloride SA, ipratropium-albuterol, rivaroxaban, OXYGEN, meclizine, promethazine, furosemide, zolpidem, omeprazole, Fusion Plus, and linaclotide.  Pharmacotherapy (Medications Ordered): Meds ordered this encounter  Medications  . oxyCODONE (OXY IR/ROXICODONE) 5 MG immediate release tablet    Sig: Take 1 tablet (5 mg total) by mouth 3 (three) times daily for 7 days. Max: 3/day. Must last 7 days.    Dispense:  21 tablet    Refill:  0    Prescription is part of a downward taper. Fill prescriptions in the correct order. Failure to do so may trigger withdrawal syndrome. Fill date: 01/22/2020. To last until: 01/29/2020.  Marland Kitchen oxyCODONE (OXY IR/ROXICODONE) 5 MG immediate release tablet    Sig: Take 1 tablet (5 mg total) by mouth 2 (two) times daily for 7 days. Max: 2/day. Must last 7 days.    Dispense:  14 tablet    Refill:  0    Prescription is part of a downward taper. Fill prescriptions in the correct order. Failure to do so may trigger withdrawal syndrome. Fill date: 01/29/2020. To last until: 02/05/2020.  Marland Kitchen oxyCODONE (OXY IR/ROXICODONE) 5 MG immediate release tablet    Sig: Take 1 tablet (5 mg total) by mouth daily for 7 days. Max: 1/day. Must last 7 days.    Dispense:  7 tablet    Refill:  0    Prescription is part of a downward taper. Fill prescriptions in the correct order. Failure to do so may trigger withdrawal syndrome. Fill date: 02/05/2020. To last until: 02/12/2020.   Orders:  No orders of the defined types were placed in this encounter.  Follow-up plan:    Return if symptoms worsen or fail to improve, for PRN Procedure(s) once she has brought her BMI to < 35.      Interventional management options: - No more opioid analgesics by our practice. Considering:   NOTE: XARELTOAnticoagulation.(Stop:x 3 days prior to procedures. Re-start:6 hours after) - (NO STEROIDS- CHF) - NO RFA until BMI < 35 Diagnostic bilateral lumbar facet nerve blocks(100/100/40 w/o steroids) Diagnostic bilateral lumbar facet RFA(NO RFA until BMI <35) Diagnostic bilateral sacroiliac joint block  Possible bilateral sacroiliac joint RFA (NO RFA until BMI <35) Diagnostic right knee genicular nerve block Possible right knee genicular nerve RFA   Palliative PRN treatment(s):   None at this time    Recent Visits Date Type Provider Dept  12/14/19 Office Visit Milinda Pointer, MD [facility]  12/13/19 Telemedicine Milinda Pointer, MD [facility]  Showing recent visits within past 90 days and meeting all other requirements   Today's Visits Date Type Provider Dept  01/15/20 Telemedicine Milinda Pointer, MD [facility]  Showing today's visits and meeting all other requirements   Future Appointments Date Type Provider Dept  03/11/20 Appointment Milinda Pointer, MD [facility]  Showing future appointments within next 90 days and meeting all other requirements   I discussed the assessment and treatment plan with the patient. The patient was provided an opportunity to ask questions and all were answered. The patient agreed with the plan and demonstrated an understanding of the instructions.  Patient advised to  call back or seek an in-person evaluation if the symptoms or condition worsens.  Duration of encounter: 15 minutes.  Note by: Gaspar Cola, MD Date: 01/15/2020; Time: 10:05 AM

## 2020-01-15 ENCOUNTER — Other Ambulatory Visit: Payer: Self-pay

## 2020-01-15 ENCOUNTER — Ambulatory Visit: Payer: Medicaid Other | Attending: Pain Medicine | Admitting: Pain Medicine

## 2020-01-15 DIAGNOSIS — Z87898 Personal history of other specified conditions: Secondary | ICD-10-CM

## 2020-01-15 DIAGNOSIS — Z9114 Patient's other noncompliance with medication regimen: Secondary | ICD-10-CM | POA: Diagnosis not present

## 2020-01-15 DIAGNOSIS — F1991 Other psychoactive substance use, unspecified, in remission: Secondary | ICD-10-CM | POA: Insufficient documentation

## 2020-01-15 DIAGNOSIS — M79605 Pain in left leg: Secondary | ICD-10-CM | POA: Diagnosis not present

## 2020-01-15 DIAGNOSIS — M545 Low back pain, unspecified: Secondary | ICD-10-CM

## 2020-01-15 DIAGNOSIS — F1990 Other psychoactive substance use, unspecified, uncomplicated: Secondary | ICD-10-CM | POA: Insufficient documentation

## 2020-01-15 DIAGNOSIS — G894 Chronic pain syndrome: Secondary | ICD-10-CM | POA: Diagnosis not present

## 2020-01-15 DIAGNOSIS — G8929 Other chronic pain: Secondary | ICD-10-CM

## 2020-01-15 DIAGNOSIS — Z91148 Patient's other noncompliance with medication regimen for other reason: Secondary | ICD-10-CM | POA: Insufficient documentation

## 2020-01-15 MED ORDER — OXYCODONE HCL 5 MG PO TABS: 5.0000 mg | ORAL_TABLET | Freq: Two times a day (BID) | ORAL | 0 refills | Status: DC

## 2020-01-15 MED ORDER — OXYCODONE HCL 5 MG PO TABS: 5.0000 mg | ORAL_TABLET | Freq: Every day | ORAL | 0 refills | Status: DC

## 2020-01-15 MED ORDER — OXYCODONE HCL 5 MG PO TABS: 5.0000 mg | ORAL_TABLET | Freq: Three times a day (TID) | ORAL | 0 refills | Status: DC

## 2020-01-16 ENCOUNTER — Ambulatory Visit: Admission: RE | Admit: 2020-01-16 | Payer: Medicaid Other | Source: Home / Self Care | Admitting: Gastroenterology

## 2020-01-16 ENCOUNTER — Other Ambulatory Visit: Payer: Self-pay | Admitting: Physician Assistant

## 2020-01-16 ENCOUNTER — Encounter: Admission: RE | Payer: Self-pay | Source: Home / Self Care

## 2020-01-16 DIAGNOSIS — K219 Gastro-esophageal reflux disease without esophagitis: Secondary | ICD-10-CM

## 2020-01-16 SURGERY — COLONOSCOPY WITH PROPOFOL
Anesthesia: Choice

## 2020-01-22 ENCOUNTER — Telehealth: Payer: Self-pay | Admitting: Pain Medicine

## 2020-01-22 ENCOUNTER — Other Ambulatory Visit: Payer: Self-pay | Admitting: Pain Medicine

## 2020-01-22 DIAGNOSIS — G894 Chronic pain syndrome: Secondary | ICD-10-CM

## 2020-01-22 DIAGNOSIS — Z79891 Long term (current) use of opiate analgesic: Secondary | ICD-10-CM

## 2020-01-22 MED ORDER — OXYCODONE HCL 5 MG PO TABS: 5.0000 mg | ORAL_TABLET | Freq: Three times a day (TID) | ORAL | 0 refills | Status: DC

## 2020-01-22 MED ORDER — OXYCODONE HCL 5 MG PO TABS: 5.0000 mg | ORAL_TABLET | Freq: Every day | ORAL | 0 refills | Status: DC

## 2020-01-22 MED ORDER — OXYCODONE HCL 5 MG PO TABS: 5.0000 mg | ORAL_TABLET | Freq: Two times a day (BID) | ORAL | 0 refills | Status: DC

## 2020-01-22 NOTE — Telephone Encounter (Signed)
Patient called pharmacy to have meds filled and was told they do not have any scripts there for her. Please call pharmacy and also let patient know status. Thank you

## 2020-01-22 NOTE — Telephone Encounter (Signed)
Script failed to send. I have contacted Dr. Dossie Arbour and he is supposed to let me know when he has resent.

## 2020-01-22 NOTE — Telephone Encounter (Signed)
Patient states she has called pharmacy to pick up medications and they are telling her they do not have scripts. She says she can see them in [place]. Please call pharmacy and let patient know when she can pick up her medications. This is a taper down prescription to discontinue the Oxycontin

## 2020-01-22 NOTE — Progress Notes (Signed)
All 3 prescriptions written on 01/15/2020 failed to transmit.  That I am having to send them again.  These will be the last 3 prescriptions that this patient will ever get from [place].

## 2020-01-22 NOTE — Telephone Encounter (Addendum)
error 

## 2020-01-29 ENCOUNTER — Other Ambulatory Visit: Payer: Self-pay | Admitting: Pulmonary Disease

## 2020-01-29 DIAGNOSIS — J4541 Moderate persistent asthma with (acute) exacerbation: Secondary | ICD-10-CM

## 2020-01-31 ENCOUNTER — Emergency Department: Payer: Medicaid Other

## 2020-01-31 ENCOUNTER — Emergency Department
Admission: EM | Admit: 2020-01-31 | Discharge: 2020-01-31 | Disposition: A | Discharge status: Home / Self Care | Payer: Medicaid Other | Attending: Emergency Medicine | Admitting: Emergency Medicine

## 2020-01-31 ENCOUNTER — Other Ambulatory Visit: Payer: Self-pay

## 2020-01-31 DIAGNOSIS — G8929 Other chronic pain: Secondary | ICD-10-CM | POA: Insufficient documentation

## 2020-01-31 DIAGNOSIS — Z96651 Presence of right artificial knee joint: Secondary | ICD-10-CM | POA: Diagnosis not present

## 2020-01-31 DIAGNOSIS — I5032 Chronic diastolic (congestive) heart failure: Secondary | ICD-10-CM | POA: Insufficient documentation

## 2020-01-31 DIAGNOSIS — I110 Hypertensive heart disease with heart failure: Secondary | ICD-10-CM | POA: Insufficient documentation

## 2020-01-31 DIAGNOSIS — R197 Diarrhea, unspecified: Secondary | ICD-10-CM | POA: Insufficient documentation

## 2020-01-31 DIAGNOSIS — R519 Headache, unspecified: Secondary | ICD-10-CM | POA: Diagnosis not present

## 2020-01-31 DIAGNOSIS — J449 Chronic obstructive pulmonary disease, unspecified: Secondary | ICD-10-CM | POA: Insufficient documentation

## 2020-01-31 DIAGNOSIS — R42 Dizziness and giddiness: Secondary | ICD-10-CM | POA: Diagnosis not present

## 2020-01-31 DIAGNOSIS — F17210 Nicotine dependence, cigarettes, uncomplicated: Secondary | ICD-10-CM | POA: Diagnosis not present

## 2020-01-31 DIAGNOSIS — Z951 Presence of aortocoronary bypass graft: Secondary | ICD-10-CM | POA: Diagnosis not present

## 2020-01-31 LAB — COMPREHENSIVE METABOLIC PANEL
ALT: 19 U/L (ref 0–44)
AST: 22 U/L (ref 15–41)
Albumin: 4 g/dL (ref 3.5–5.0)
Alkaline Phosphatase: 145 U/L — ABNORMAL HIGH (ref 38–126)
Anion gap: 15 (ref 5–15)
BUN: 16 mg/dL (ref 6–20)
CO2: 32 mmol/L (ref 22–32)
Calcium: 8.9 mg/dL (ref 8.9–10.3)
Chloride: 91 mmol/L — ABNORMAL LOW (ref 98–111)
Creatinine, Ser: 0.87 mg/dL (ref 0.44–1.00)
GFR calc Af Amer: >60 mL/min (ref >60)
GFR calc non Af Amer: >60 mL/min (ref >60)
Glucose, Bld: 111 mg/dL — ABNORMAL HIGH (ref 70–99)
Potassium: 2.9 mmol/L — ABNORMAL LOW (ref 3.5–5.1)
Sodium: 138 mmol/L (ref 135–145)
Total Bilirubin: 0.5 mg/dL (ref 0.3–1.2)
Total Protein: 8 g/dL (ref 6.5–8.1)

## 2020-01-31 LAB — CBC WITH DIFFERENTIAL/PLATELET
Abs Immature Granulocytes: 0.05 10*3/uL (ref 0.00–0.07)
Basophils Absolute: 0.1 10*3/uL (ref 0.0–0.1)
Basophils Relative: 0 %
Eosinophils Absolute: 0.2 10*3/uL (ref 0.0–0.5)
Eosinophils Relative: 1 %
HCT: 37.9 % (ref 36.0–46.0)
Hemoglobin: 11.1 g/dL — ABNORMAL LOW (ref 12.0–15.0)
Immature Granulocytes: 0 %
Lymphocytes Relative: 15 %
Lymphs Abs: 1.9 10*3/uL (ref 0.7–4.0)
MCH: 22.8 pg — ABNORMAL LOW (ref 26.0–34.0)
MCHC: 29.3 g/dL — ABNORMAL LOW (ref 30.0–36.0)
MCV: 78 fL — ABNORMAL LOW (ref 80.0–100.0)
Monocytes Absolute: 1 10*3/uL (ref 0.1–1.0)
Monocytes Relative: 8 %
Neutro Abs: 9.1 10*3/uL — ABNORMAL HIGH (ref 1.7–7.7)
Neutrophils Relative %: 76 %
Platelets: 232 10*3/uL (ref 150–400)
RBC: 4.86 MIL/uL (ref 3.87–5.11)
RDW: 17.6 % — ABNORMAL HIGH (ref 11.5–15.5)
WBC: 12.2 10*3/uL — ABNORMAL HIGH (ref 4.0–10.5)
nRBC: 0 % (ref 0.0–0.2)

## 2020-01-31 LAB — URINALYSIS, COMPLETE (UACMP) WITH MICROSCOPIC
Bacteria, UA: NONE SEEN
Bilirubin Urine: NEGATIVE
Glucose, UA: NEGATIVE mg/dL
Hgb urine dipstick: NEGATIVE
Ketones, ur: NEGATIVE mg/dL
Leukocytes,Ua: NEGATIVE
Nitrite: NEGATIVE
Protein, ur: NEGATIVE mg/dL
Specific Gravity, Urine: 1.012 (ref 1.005–1.030)
pH: 7 (ref 5.0–8.0)

## 2020-01-31 LAB — TROPONIN I (HIGH SENSITIVITY): Troponin I (High Sensitivity): 6 ng/L (ref <18)

## 2020-01-31 MED ORDER — MECLIZINE HCL 25 MG PO TABS
50.0000 mg | ORAL_TABLET | Freq: Once | ORAL | Status: AC
  Administered 2020-01-31: 50 mg via ORAL
  Filled 2020-01-31: qty 2

## 2020-01-31 MED ORDER — MORPHINE SULFATE (PF) 4 MG/ML IV SOLN
4.0000 mg | Freq: Once | INTRAVENOUS | Status: AC
  Administered 2020-01-31: 4 mg via INTRAVENOUS
  Filled 2020-01-31: qty 1

## 2020-01-31 MED ORDER — METOCLOPRAMIDE HCL 5 MG/ML IJ SOLN
10.0000 mg | Freq: Once | INTRAMUSCULAR | Status: AC
  Administered 2020-01-31: 10 mg via INTRAVENOUS
  Filled 2020-01-31: qty 2

## 2020-01-31 MED ORDER — OXYCODONE-ACETAMINOPHEN 5-325 MG PO TABS
2.0000 | ORAL_TABLET | Freq: Once | ORAL | Status: AC
  Administered 2020-01-31: 2 via ORAL
  Filled 2020-01-31: qty 2

## 2020-01-31 MED ORDER — DIAZEPAM 5 MG PO TABS
5.0000 mg | ORAL_TABLET | Freq: Once | ORAL | Status: AC
  Administered 2020-01-31: 5 mg via ORAL
  Filled 2020-01-31: qty 1

## 2020-01-31 MED ORDER — ONDANSETRON HCL 4 MG/2ML IJ SOLN
4.0000 mg | Freq: Once | INTRAMUSCULAR | Status: AC
  Administered 2020-01-31: 4 mg via INTRAVENOUS
  Filled 2020-01-31: qty 2

## 2020-01-31 MED ORDER — SODIUM CHLORIDE 0.9 % IV SOLN
1000.0000 mL | Freq: Once | INTRAVENOUS | Status: AC
  Administered 2020-01-31: 1000 mL via INTRAVENOUS

## 2020-01-31 NOTE — ED Notes (Signed)
Pt talking loudly on cell phone in room, speech clear

## 2020-01-31 NOTE — ED Triage Notes (Signed)
Pt arrives via [facility] for a possible syncopal episode while at work today. Pt reports hx of syncope and states she got dizzy and lightheaded and then she fell on the couch. Pt reports she did not hit her head. States when she woke up she woke up on the floor. PT is a poor historian at this time. Speech clear, VSS in route.

## 2020-01-31 NOTE — ED Notes (Signed)
Pt ambulatory to restroom at this time, stand by assist. NAD noted

## 2020-01-31 NOTE — ED Notes (Signed)
Ok to d/c 2nd trop per Dr. Jimmye Norman

## 2020-01-31 NOTE — ED Notes (Signed)
Pt otf for imaging 

## 2020-01-31 NOTE — ED Provider Notes (Signed)
[facility] Emergency Department Provider Note       Time seen: ----------------------------------------- 11:21 AM on 01/31/2020 -----------------------------------------   I have reviewed the triage vital signs and the nursing notes.  HISTORY   Chief Complaint Dizziness    HPI Chelsea Ramsey is a 54 y.o. female with a history of anxiety, CHF, COPD, fibromyalgia, hypertension, peptic ulcer disease, rheumatic fever who presents to the ED for room spinning sensation as well as persistent vomiting and headache.  Patient states she has a history of vertigo, symptoms feel like she has had in the past.  She also has been having some diarrhea.  She also may have passed out earlier.  Past Medical History:  Diagnosis Date  . Acute drug-induced gout of left foot 03/01/2018   Last Assessment & Plan:  Likely at least partially brought on by diuresis.  Needs to continue to diurese  Will push hydration Stop allopurinol given initiation during acute flare may worsen this, re-broach this when asymptomatic Avoid nsaids given stomach pain Trial colchicine Add acetaminophen Ice, elevate, rest  . Allergy    seasonal  . Anxiety   . Arthritis    Right Knee  . Asthma   . CHF (congestive heart failure) ([place])   . COPD (chronic obstructive pulmonary disease) ([place])   . Coronary artery disease    Leaky heart valve  . Dysrhythmia   . Fibromyalgia   . GERD (gastroesophageal reflux disease)   . Hypertension   . Pneumonia   . PUD (peptic ulcer disease)   . Pulmonary HTN ([place])   . Rheumatic fever/heart disease   . Sleep apnea     Patient Active Problem List   Diagnosis Date Noted  . Non compliance w medication regimen 01/15/2020  . Pain medication agreement broken 01/15/2020  . History of illicit drug use [id number]  . Misuse of prescription only drugs 01/15/2020  . History of gastric ulcer 01/08/2020  . Stomach irritation 01/08/2020  . Esophageal dysphagia  01/08/2020  . Class 3 severe obesity due to excess calories with serious comorbidity in adult ([place]) 01/08/2020  . Pharmacologic therapy 12/14/2019  . Prediabetes 09/27/2019  . Numbness 08/03/2019  . Headache disorder 08/03/2019  . Elevated uric acid in blood 02/08/2019  . Hyperglycemia 02/08/2019  . Secondary osteoarthritis of multiple sites 02/08/2019  . Osteoarthritis of knees (Bilateral) (R>L) 02/08/2019  . Chronic gout of multiple sites, unspecified cause 02/08/2019  . Chronic musculoskeletal pain (Fourth Area of Pain) 02/08/2019  . Epidural lipomatosis 02/08/2019  . 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/08/2019  . History of cocaine use 01/18/2019  . Chronic gout involving toe of foot, unspecified cause (Left) 01/18/2019  . Chronic hip pain (Left) 01/18/2019  . Osteoarthritis of sacroiliac joints (Bilateral) 01/18/2019  . Other specified dorsopathies, sacral and sacrococcygeal region 01/18/2019  . Lumbar facet arthropathy (Bilateral) 01/05/2019  . Melena 01/03/2019  . Acute esophagogastric ulcer 01/03/2019  . Spondylosis without myelopathy or radiculopathy, lumbar region 12/27/2018  . Lumbar facet syndrome (Bilateral) 12/27/2018  . DDD (degenerative disc disease), lumbar 12/27/2018  . Chronic low back pain (Primary Area of Pain) (Bilateral) (L>R) w/o sciatica 12/27/2018  . Abnormal MRI, lumbar spine (01/05/2018) 12/27/2018  . Elevated sed rate 12/19/2018  . Elevated C-reactive protein 12/19/2018  . Diplopia 12/14/2018  . Dizziness 12/14/2018  . Headache, chronic daily 12/14/2018  . Numbness and tingling of both feet 12/14/2018  . CHF (congestive heart failure) ([place]) 12/04/2018  . Class 3 severe obesity with body mass  index (BMI) of 45.0 to 49.9 in adult [facility]) 11/14/2018  . Chronic anticoagulation (XARELTO) 11/14/2018  . Vitamin D deficiency 11/14/2018  . Chronic knee pain after total replacement (Fifth Area of Pain) (Right) 11/14/2018  . Chest pain with moderate risk of acute  coronary syndrome 10/17/2018  . Chronic low back pain (Primary Area of Pain) (Bilateral) w/ sciatica (Left) 10/10/2018  . Chronic lower extremity pain (Secondary Area of Pain) (Left) 10/10/2018  . Sternal pain (Tertiary Area of Pain) 10/10/2018  . Fibromyalgia (Fourth Area of Pain) 10/10/2018  . Chronic knee pain (Right) 10/10/2018  . Chronic sacroiliac joint pain (Bilateral) (L>R) 10/10/2018  . Chronic pain syndrome 10/10/2018  . Long term current use of opiate analgesic 10/10/2018  . Screening for colon cancer 10/10/2018  . Disorder of skeletal system 10/10/2018  . Problems influencing health status 10/10/2018  . HTN (hypertension) 07/26/2018  . Acute CHF (congestive heart failure) ([place]) 07/17/2018  . S/P MVR (mitral valve replacement) 06/20/2018  . Shortness of breath 05/27/2018  . GIB (gastrointestinal bleeding) 05/22/2018  . A-fib ([place]) 04/22/2018  . Insomnia 12/25/2016  . Osteoarthritis of knee (Right) 09/04/2016  . Tobacco dependence 05/08/2015  . GAD (generalized anxiety disorder) 03/13/2015  . Moderate to severe pulmonary hypertension ([place]) 03/13/2015  . Rheumatic heart disease, unspecified 03/13/2015  . Chronic diastolic heart failure ([place]) 02/25/2015  . Depression 02/25/2015  . History of substance abuse ([place]) 02/25/2015    Past Surgical History:  Procedure Laterality Date  . ADENOIDECTOMY    . COLONOSCOPY WITH PROPOFOL N/A 11/06/2019   Procedure: COLONOSCOPY WITH PROPOFOL;  Surgeon: Virgel Manifold, MD;  Location: [facility] ENDOSCOPY;  Service: Endoscopy;  Laterality: N/A;  2 day prep   . CORONARY ARTERY BYPASS GRAFT     June 27 2018  . ESOPHAGOGASTRODUODENOSCOPY (EGD) WITH PROPOFOL N/A 05/25/2018   Procedure: ESOPHAGOGASTRODUODENOSCOPY (EGD) WITH PROPOFOL;  Surgeon: Lucilla Lame, MD;  Location: [facility] ENDOSCOPY;  Service: Endoscopy;  Laterality: N/A;  . ESOPHAGOGASTRODUODENOSCOPY (EGD) WITH PROPOFOL N/A 11/06/2019   Procedure: ESOPHAGOGASTRODUODENOSCOPY (EGD) WITH  PROPOFOL;  Surgeon: Virgel Manifold, MD;  Location: [facility] ENDOSCOPY;  Service: Endoscopy;  Laterality: N/A;  . MITRAL VALVE REPLACEMENT    . MULTIPLE TOOTH EXTRACTIONS    . TONSILLECTOMY    . TOTAL KNEE ARTHROPLASTY Right 09/04/2016   Procedure: TOTAL KNEE ARTHROPLASTY; with lateral release;  Surgeon: Earlie Server, MD;  Location: [place];  Service: Orthopedics;  Laterality: Right;    Allergies Amiodarone, Aspirin, Flexeril [cyclobenzaprine], Trazamine [trazodone & diet manage prod], Codeine, and Tramadol  Social History Social History   Tobacco Use  . Smoking status: Current Every Day Smoker    Packs/day: 0.10    Types: Cigarettes  . Smokeless tobacco: Never Used  . Tobacco comment: 4/20 was not able to quit.  Still at 1-3 a day. She would like to wait to set a new quit date until we get back to class to have the extra in person support  Substance Use Topics  . Alcohol use: Never    Alcohol/week: 3.0 standard drinks    Types: 3 Cans of beer per week    Comment: 16 oz per week  . Drug use: Not Currently    Comment: Previous use of cocaine and marijuana last use 07/31/16     Review of Systems Constitutional: Negative for fever. Cardiovascular: Negative for chest pain. Respiratory: Negative for shortness of breath. Gastrointestinal: Negative for abdominal pain, positive for nausea vomiting Musculoskeletal: Negative for back pain. Skin: Negative for  rash. Neurological: Negative for headaches, focal weakness or numbness.  Positive for vertigo  All systems negative/normal/unremarkable except as stated in the HPI  ____________________________________________   PHYSICAL EXAM:  VITAL SIGNS: ED Triage Vitals  Enc Vitals Group     BP      Pulse      Resp      Temp      Temp src      SpO2      Weight      Height      Head Circumference      Peak Flow      Pain Score      Pain Loc      Pain Edu?      Excl. in [place]?    Constitutional: Alert and oriented.  Anxious, no  distress Eyes: Conjunctivae are normal. Normal extraocular movements. ENT      Head: Normocephalic and atraumatic.      Nose: No congestion/rhinnorhea.      Mouth/Throat: Mucous membranes are moist.      Neck: No stridor. Cardiovascular: Normal rate, regular rhythm. No murmurs, rubs, or gallops. Respiratory: Normal respiratory effort without tachypnea nor retractions. Breath sounds are clear and equal bilaterally. No wheezes/rales/rhonchi. Gastrointestinal: Soft and nontender. Normal bowel sounds Musculoskeletal: Nontender with normal range of motion in extremities. No lower extremity tenderness nor edema. Neurologic:  Normal speech and language. No gross focal neurologic deficits are appreciated.  Skin:  Skin is warm, dry and intact. No rash noted. Psychiatric: Depressed mood and affect ____________________________________________  EKG: Interpreted by me.  Sinus rhythm rate of 73 bpm, normal PR interval, normal QRS, normal QT  ____________________________________________  ED COURSE:  As part of my medical decision making, I reviewed the following data within the [place] History obtained from family if available, nursing notes, old chart and ekg, as well as notes from prior ED visits. Patient presented for dizziness, we will assess with labs and imaging as indicated at this time.   Procedures  Chelsea Ramsey was evaluated in Emergency Department on 01/31/2020 for the symptoms described in the history of present illness. She was evaluated in the context of the global COVID-19 pandemic, which necessitated consideration that the patient might be at risk for infection with the SARS-CoV-2 virus that causes COVID-19. Institutional protocols and algorithms that pertain to the evaluation of patients at risk for COVID-19 are in a state of rapid change based on information released by regulatory bodies including the CDC and federal and state organizations. These policies  and algorithms were followed during the patient's care in the ED.  ____________________________________________   LABS (pertinent positives/negatives)  Labs Reviewed  CBC WITH DIFFERENTIAL/PLATELET - Abnormal; Notable for the following components:      Result Value   WBC 12.2 (*)    Hemoglobin 11.1 (*)    MCV 78.0 (*)    MCH 22.8 (*)    MCHC 29.3 (*)    RDW 17.6 (*)    Neutro Abs 9.1 (*)    All other components within normal limits  COMPREHENSIVE METABOLIC PANEL - Abnormal; Notable for the following components:   Potassium 2.9 (*)    Chloride 91 (*)    Glucose, Bld 111 (*)    Alkaline Phosphatase 145 (*)    All other components within normal limits  URINALYSIS, COMPLETE (UACMP) WITH MICROSCOPIC - Abnormal; Notable for the following components:   Color, Urine YELLOW (*)    APPearance CLEAR (*)  All other components within normal limits  TROPONIN I (HIGH SENSITIVITY)    RADIOLOGY Images were viewed by me  CT head  IMPRESSION:  No acute intracranial abnormality.  ____________________________________________   DIFFERENTIAL DIAGNOSIS   Peripheral vertigo, anxiety, depression, central vertigo, CVA, subarachnoid hemorrhage  FINAL ASSESSMENT AND PLAN  Vertigo, headache   Plan: The patient had presented for symptoms of vertigo with vomiting. Patient's labs did not reveal an acute process. Patient's imaging was reassuring as well.  Patient was given meclizine as well as Valium for vertigo.  She was also given Reglan and pain medication for her chronic headaches.  She is encouraged to have close outpatient follow-up.   Laurence Aly, MD    Note: This note was generated in part or whole with voice recognition software. Voice recognition is usually quite accurate but there are transcription errors that can and very often do occur. I apologize for any typographical errors that were not detected and corrected.     Earleen Newport, MD 01/31/20 [phone]

## 2020-02-05 ENCOUNTER — Other Ambulatory Visit: Payer: Self-pay | Admitting: Physician Assistant

## 2020-02-05 ENCOUNTER — Other Ambulatory Visit: Payer: Self-pay | Admitting: Pain Medicine

## 2020-02-05 DIAGNOSIS — Z79891 Long term (current) use of opiate analgesic: Secondary | ICD-10-CM

## 2020-02-05 DIAGNOSIS — K219 Gastro-esophageal reflux disease without esophagitis: Secondary | ICD-10-CM

## 2020-02-05 DIAGNOSIS — G894 Chronic pain syndrome: Secondary | ICD-10-CM

## 2020-02-08 ENCOUNTER — Encounter: Payer: Self-pay | Admitting: Pain Medicine

## 2020-02-08 ENCOUNTER — Ambulatory Visit: Payer: Medicaid Other | Attending: Pulmonary Disease

## 2020-02-16 ENCOUNTER — Other Ambulatory Visit: Payer: Self-pay | Admitting: Physician Assistant

## 2020-02-16 DIAGNOSIS — G8929 Other chronic pain: Secondary | ICD-10-CM

## 2020-02-16 DIAGNOSIS — R519 Headache, unspecified: Secondary | ICD-10-CM

## 2020-02-20 ENCOUNTER — Other Ambulatory Visit: Payer: Self-pay | Admitting: Physician Assistant

## 2020-02-20 DIAGNOSIS — R42 Dizziness and giddiness: Secondary | ICD-10-CM

## 2020-02-20 MED ORDER — PROMETHAZINE HCL 12.5 MG PO TABS: 12.5000 mg | ORAL_TABLET | Freq: Three times a day (TID) | ORAL | 0 refills | Status: AC | PRN

## 2020-03-04 ENCOUNTER — Encounter: Payer: Self-pay | Admitting: Pain Medicine

## 2020-03-04 NOTE — Progress Notes (Signed)
Patient: Chelsea Ramsey  Service Category: E/M  Provider: Gaspar Cola, MD  DOB: 1966-05-08  DOS: 03/05/2020  Location: Office  MRN: [id number]  Setting: Ambulatory outpatient  Referring Provider: Trinna Post, PA-C  Type: Established Patient  Specialty: Interventional Pain Management  PCP: Chelsea Post, PA-C  Location: Remote location  Delivery: TeleHealth     Virtual Encounter - Pain Management PROVIDER NOTE: Information contained herein reflects review and annotations entered in association with encounter. Interpretation of such information and data should be left to medically-trained personnel. Information provided to patient can be located elsewhere in the medical Ramsey under "Patient Instructions". Document created using STT-dictation technology, any transcriptional errors that may result from process are unintentional.    Contact & Pharmacy Preferred: [phone] Home: [phone] (home) Mobile: [phone] (mobile) E-mail: [email or web address]  [place] [place] [facility] [place] [address] [place] [address] Phone: [phone] Fax: [phone]   Pre-screening  Chelsea Ramsey offered "in-person" vs "virtual" encounter. She indicated preferring virtual for this encounter.   Reason COVID-19*  Social distancing based on CDC and AMA recommendations.   I contacted Chelsea Ramsey on 03/05/2020 via telephone.      I clearly identified myself as Gaspar Cola, MD. I verified that I was speaking with the correct person using two identifiers (Name: Chelsea Ramsey, and date of birth: 08-23-1966).  Consent I sought verbal advanced consent from Chelsea Ramsey for virtual visit interactions. I informed Chelsea Ramsey of possible security and privacy concerns, risks, and limitations associated with providing "not-in-person" medical evaluation and management services. I also informed Chelsea Ramsey of the availability of "in-person" appointments. Finally, I informed her that there would be a charge for the virtual visit and that she could be  personally, fully or partially, financially responsible for it. Chelsea Ramsey expressed understanding and agreed to proceed.   Historic Elements   Chelsea Ramsey is a 54 y.o. year old, female patient evaluated today after her last contact with our practice on 01/22/2020. Chelsea Ramsey  has a past medical history of Acute drug-induced gout of left foot (03/01/2018), Allergy, Anxiety, Arthritis, Asthma, CHF (congestive heart failure) ([place]), COPD (chronic obstructive pulmonary disease) ([place]), Coronary artery disease, Dysrhythmia, Fibromyalgia, GERD (gastroesophageal reflux disease), Hypertension, Pneumonia, PUD (peptic ulcer disease), Pulmonary HTN ([place]), Rheumatic fever/heart disease, and Sleep apnea. 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 Chelsea Ramsey has a current medication list which includes the following prescription(s): albuterol, allopurinol, budesonide, butalbital-acetaminophen-caffeine, gnp calcium 1200, d 5000, colchicine, furosemide, gabapentin, ipratropium-albuterol, lactulose, linaclotide, meclizine, metolazone, metoprolol tartrate, nitroglycerin, nortriptyline, omeprazole, oxygen-helium, potassium chloride sa, promethazine, rivaroxaban, tizanidine, zolpidem, and potassium chloride. She  reports that she has been smoking cigarettes. She has been smoking about 0.10 packs per day. She has never used smokeless tobacco. She reports previous drug use. She reports that she does not drink alcohol. Chelsea Ramsey is allergic to amiodarone; aspirin; flexeril [cyclobenzaprine]; trazamine Chelsea Ramsey]; codeine; and tramadol.   HPI  Today, she is being contacted for follow-up evaluation.  Today the patient was trying to have me give her a second chance with regards to the opioids, but that is not how we work.  Once our trust has been broken, there is  no going back.  I have explained to the patient that if she can bring her BMI below 35 I would be more than happy to proceed and do her radiofrequency ablation of the lumbar facets so as to provide her with longer lasting benefit.  She again tried to have me write for some pain medicine at least on a as needed basis, but the answer was always the same, no.  The patient indicated that she would be trying to get another pain practice were she can go and continue with her medications as this seems to be the only thing that she feels can help her.  I wished her good luck and I recommended that she be upfront with her next physician and to never use any medications that are not prescribed by them.  She understood and accepted.  Pharmacotherapy Assessment  Analgesic: No more opioids from our practice, due to medication agreement violation documented on 01/15/2020 note. Abnormal, noncompliant UDS (12/14/2019) showing the patient to be taking illicit hydrocodone in addition to the oxycodone that we were prescribing and benzodiazepines. MME/day:  Unknown.   Monitoring: [place] PMP: PDMP reviewed during this encounter.       Pharmacotherapy: No side-effects or adverse reactions reported. Compliance: No problems identified. Effectiveness: Clinically acceptable. Plan: Refer to "POC".  UDS:  Summary  Date Value Ref Range Status  12/14/2019 Note  Final    Comment:    ==================================================================== [employer] [facility] 13 (MW) ==================================================================== Test                              Result       Flag       Units Drug Present and Declared for Prescription Verification   Desmethyldiazepam              409          EXPECTED   ng/mg creat   Oxazepam                       772          EXPECTED   ng/mg creat   Temazepam                      740          EXPECTED   ng/mg creat    Desmethyldiazepam, oxazepam, and temazepam are expected metabolites    of diazepam. Desmethyldiazepam and oxazepam are also expected    metabolites of other drugs, including chlordiazepoxide, prazepam,    clorazepate, and halazepam. Oxazepam is an expected metabolite of    temazepam. Oxazepam and temazepam are also available as scheduled    prescription medications.   Oxycodone                      1345         EXPECTED   ng/mg creat   Oxymorphone                    487          EXPECTED   ng/mg creat   Noroxymorphone                 160          EXPECTED   ng/mg creat    Sources of oxycodone include scheduled prescription medications.    Oxymorphone and noroxymorphone are expected  metabolites of oxycodone.    Oxymorphone is also available as a scheduled prescription    medication.   Butalbital                     PRESENT      EXPECTED Drug Present not Declared for Prescription Verification   Hydrocodone                    966          UNEXPECTED ng/mg creat   Hydromorphone                  164          UNEXPECTED ng/mg creat   Dihydrocodeine                 481          UNEXPECTED ng/mg creat   Norhydrocodone                 1358         UNEXPECTED ng/mg creat    Sources of hydrocodone include scheduled prescription medications.    Hydromorphone, dihydrocodeine and norhydrocodone are expected    metabolites of hydrocodone. Hydromorphone and dihydrocodeine are    also available as scheduled prescription medications. ==================================================================== Test                      Result    Flag   Units      Ref Range   Creatinine              53                mg/dL      >=20 ==================================================================== Declared Medications:  The flagging and interpretation on this report are based on the  following declared medications.  Unexpected results may arise from  inaccuracies in the declared medications.  **Note: The testing scope of this panel includes these medications:  Butalbital (Fioricet)  Diazepam  Oxycodone  **Note: The testing scope of this panel does not include the  following reported medications:  Acetaminophen (Fioricet)  Albuterol  Albuterol (Duoneb)  Allopurinol  Caffeine (Fioricet)  Calcium  Colchicine  Docusate  Furosemide  Gabapentin  Helium  Ipratropium (Duoneb)  Lactulose  Linaclotide (Linzess)  Meclizine  Metolazone  Metoprolol  Nitroglycerin  Nortriptyline  Omeprazole  Oxygen  Potassium  Promethazine  Rivaroxaban  Vitamin D  Zolpidem ==================================================================== For clinical consultation, please call [phone]. ====================================================================    Laboratory Chemistry Profile   Renal Lab Results  Component Value Date   BUN 16 01/31/2020   CREATININE 0.87 01/31/2020   BCR 18 10/10/2018   GFRAA >60 01/31/2020   GFRNONAA >60 01/31/2020     Hepatic Lab Results  Component Value Date   AST 22 01/31/2020   ALT 19 01/31/2020   ALBUMIN 4.0 01/31/2020   ALKPHOS 145 (H) 01/31/2020   LIPASE 37 12/01/2018     Electrolytes Lab Results  Component Value Date   NA 138 01/31/2020   K 2.9 (L) 01/31/2020   CL 91 (L) 01/31/2020   CALCIUM 8.9 01/31/2020   MG 2.1 12/03/2018   PHOS 4.1 12/03/2018     Bone Lab Results  Component Value Date   25OHVITD1 38 12/19/2018   25OHVITD2 33 12/19/2018   25OHVITD3 4.7 12/19/2018     Inflammation (CRP: Acute Phase) (ESR: Chronic Phase) Lab Results  Component Value Date   CRP 11 (  H) 10/10/2018   ESRSEDRATE 79 (H) 10/10/2018       Note:  Above Lab results reviewed.  Imaging  CT Head Wo Contrast CLINICAL DATA:  Ataxia  EXAM: CT HEAD WITHOUT CONTRAST  TECHNIQUE: Contiguous axial images were obtained from the base of the skull through the vertex without intravenous contrast.  COMPARISON:  10/11/2018  FINDINGS: Brain: No acute intracranial abnormality. Specifically, no hemorrhage, hydrocephalus, mass lesion, acute infarction, or significant intracranial injury.  Vascular: No hyperdense vessel or unexpected calcification.  Skull: No acute calvarial abnormality.  Sinuses/Orbits: Visualized paranasal sinuses and mastoids clear. Orbital soft tissues unremarkable.  Other: None  IMPRESSION: No acute intracranial abnormality.  Electronically Signed   By: Rolm Baptise M.D.   On: 01/31/2020 12:23  Assessment  The encounter diagnosis was Chronic gout involving toe of foot, unspecified cause (Left).  Plan of Care  Problem-specific:  No problem-specific Assessment & Plan notes found for this encounter.  Ms. Maja Mccaffery has a current medication list which includes the following long-term medication(s): allopurinol, gnp calcium 1200, colchicine, furosemide, gabapentin, ipratropium-albuterol, linaclotide, metolazone, metoprolol tartrate, nitroglycerin, nortriptyline, omeprazole, potassium chloride sa, and promethazine.  Pharmacotherapy (Medications Ordered): Meds ordered this encounter  Medications  . allopurinol (ZYLOPRIM) 100 MG tablet    Sig: Take 1 tablet (100 mg total) by mouth 2 (two) times daily.    Dispense:  60 tablet    Refill:  5    Fill one day early if pharmacy is closed on scheduled refill date. May substitute for generic if available.  . colchicine 0.6 MG tablet    Sig: Take 1 tablet (0.6 mg total) by mouth daily. As directed    Dispense:  30 tablet    Refill:  5    Fill one day early if pharmacy is closed on scheduled refill date. May substitute for generic if available.   Orders:   No orders of the defined types were placed in this encounter.  Follow-up plan:   No follow-ups on file.      Interventional management options: - No more opioid analgesics by our practice. Considering:   NOTE: XARELTOAnticoagulation.(Stop:x 3 days prior to procedures. Re-start:6 hours after) - (NO STEROIDS- CHF) - NO RFA until BMI < 35 Diagnostic bilateral lumbar facet nerve blocks(100/100/40 w/o steroids) Diagnostic bilateral lumbar facet RFA(NO RFA until BMI <35) Diagnostic bilateral sacroiliac joint block  Possible bilateral sacroiliac joint RFA (NO RFA until BMI <35) Diagnostic right knee genicular nerve block Possible right knee genicular nerve RFA   Palliative PRN treatment(s):   None at this time    Recent Visits Date Type Provider Dept  01/15/20 Telemedicine Milinda Pointer, [facility]  12/14/19 Office Visit Milinda Pointer, MD [facility]  12/13/19 Telemedicine Milinda Pointer, MD [facility]  Showing recent visits within past 90 days and meeting all other requirements   Today's Visits Date Type Provider Dept  03/05/20 Telemedicine Milinda Pointer, MD [facility]  Showing today's visits and meeting all other requirements   Future Appointments No visits were found meeting these conditions.  Showing future appointments within next 90 days and meeting all other requirements   I discussed the assessment and treatment plan with the patient. The patient was provided an opportunity to ask questions and all were answered. The patient agreed with the plan and demonstrated an understanding of the instructions.  Patient advised to call back or seek an in-person evaluation if the symptoms or condition worsens.  Duration of encounter:  15 minutes.  Note by: Gaspar Cola, MD Date: 03/05/2020; Time: 3:36 PM

## 2020-03-05 ENCOUNTER — Other Ambulatory Visit: Payer: Self-pay

## 2020-03-05 ENCOUNTER — Ambulatory Visit: Payer: Medicaid Other | Attending: Pain Medicine | Admitting: Pain Medicine

## 2020-03-05 DIAGNOSIS — M1A9XX0 Chronic gout, unspecified, without tophus (tophi): Secondary | ICD-10-CM | POA: Diagnosis not present

## 2020-03-05 MED ORDER — COLCHICINE 0.6 MG PO TABS: 0.6000 mg | ORAL_TABLET | Freq: Every day | ORAL | 5 refills | Status: AC

## 2020-03-05 MED ORDER — ALLOPURINOL 100 MG PO TABS: 100.0000 mg | ORAL_TABLET | Freq: Two times a day (BID) | ORAL | 5 refills | Status: AC

## 2020-03-06 ENCOUNTER — Other Ambulatory Visit: Payer: Self-pay | Admitting: Physician Assistant

## 2020-03-06 DIAGNOSIS — K219 Gastro-esophageal reflux disease without esophagitis: Secondary | ICD-10-CM

## 2020-03-06 NOTE — Telephone Encounter (Signed)
Requested Prescriptions  Pending Prescriptions Disp Refills  . omeprazole (PRILOSEC) 20 MG capsule [Pharmacy Med Name: OMEPRAZOLE DR 20 MG CAP] 120 capsule 2    Sig: TAKE 2 CAPSULES BY MOUTH TWICE A DAY BEFORE A MEAL     Gastroenterology: Proton Pump Inhibitors Passed - 03/06/2020 10:16 AM      Passed - Valid encounter within last 12 months    Recent Outpatient Visits          5 months ago Annual physical exam   [place], [place], PA-C   6 months ago Chronic obstructive pulmonary disease, unspecified COPD type [facility])   [facility] Trinna Post, [place]      Future Appointments            In 3 weeks Trinna Post, PA-C [employer], [place]

## 2020-03-07 ENCOUNTER — Other Ambulatory Visit: Payer: Self-pay | Admitting: Physician Assistant

## 2020-03-07 ENCOUNTER — Other Ambulatory Visit: Payer: Self-pay | Admitting: Family Medicine

## 2020-03-07 ENCOUNTER — Other Ambulatory Visit: Payer: Self-pay | Admitting: Gastroenterology

## 2020-03-07 DIAGNOSIS — K5903 Drug induced constipation: Secondary | ICD-10-CM

## 2020-03-07 DIAGNOSIS — R42 Dizziness and giddiness: Secondary | ICD-10-CM

## 2020-03-07 DIAGNOSIS — T402X5A Adverse effect of other opioids, initial encounter: Secondary | ICD-10-CM

## 2020-03-07 NOTE — Telephone Encounter (Signed)
Medication Refill - Medication: Promethazine 12.5  Has the patient contacted their pharmacy? Yes.   (Agent: If no, request that the patient contact the pharmacy for the refill.) (Agent: If yes, when and what did the pharmacy advise?)  Preferred Pharmacy (with phone number or street name): [facility]: Please be advised that RX refills may take up to 3 business days. We ask that you follow-up with your pharmacy.

## 2020-03-07 NOTE — Telephone Encounter (Signed)
No she is seeing ENT and neurology for this and is managed by them. I have told her this is not a consistent medication that I will be refilling, thanks.

## 2020-03-07 NOTE — Telephone Encounter (Signed)
Requested medication (s) are due for refill today:yes  Requested medication (s) are on the active medication list:yes  Last refill:  02/20/20  Future visit scheduled:  yes, tomorrow  Notes to clinic:  Not delegated    Requested Prescriptions  Pending Prescriptions Disp Refills   promethazine (PHENERGAN) 12.5 MG tablet 30 tablet 0    Sig: Take 1 tablet (12.5 mg total) by mouth every 8 (eight) hours as needed for nausea or vomiting.      Not Delegated - Gastroenterology: Antiemetics Failed - 03/07/2020  2:56 PM      Failed - This refill cannot be delegated      Failed - Valid encounter within last 6 months    Recent Outpatient Visits           5 months ago Annual physical exam   [place], [place], PA-C   6 months ago Chronic obstructive pulmonary disease, unspecified COPD type [facility])   [place], Wendee Beavers, [place]       Future Appointments             Tomorrow Trinna Post, [place], [place]

## 2020-03-07 NOTE — Telephone Encounter (Signed)
Is it okay to refill? Patient has appointment  Tomorrow, 03/08/20.

## 2020-03-07 NOTE — Telephone Encounter (Signed)
[facility] faxed refill request for the following medications:  promethazine (PHENERGAN) 12.5 MG tablet   Please advise.  Thanks, [employer]

## 2020-03-08 ENCOUNTER — Other Ambulatory Visit: Payer: Self-pay | Admitting: Family Medicine

## 2020-03-08 ENCOUNTER — Ambulatory Visit (INDEPENDENT_AMBULATORY_CARE_PROVIDER_SITE_OTHER): Payer: Medicaid Other | Admitting: Physician Assistant

## 2020-03-08 ENCOUNTER — Other Ambulatory Visit: Payer: Self-pay

## 2020-03-08 ENCOUNTER — Encounter: Payer: Self-pay | Admitting: Physician Assistant

## 2020-03-08 VITALS — BP 117/74 | HR 82 | Temp 96.9°F | Wt 306.4 lb

## 2020-03-08 DIAGNOSIS — M170 Bilateral primary osteoarthritis of knee: Secondary | ICD-10-CM | POA: Diagnosis not present

## 2020-03-08 DIAGNOSIS — E119 Type 2 diabetes mellitus without complications: Secondary | ICD-10-CM | POA: Diagnosis not present

## 2020-03-08 DIAGNOSIS — R110 Nausea: Secondary | ICD-10-CM

## 2020-03-08 DIAGNOSIS — R42 Dizziness and giddiness: Secondary | ICD-10-CM

## 2020-03-08 LAB — POCT GLYCOSYLATED HEMOGLOBIN (HGB A1C)
Est. average glucose Bld gHb Est-mCnc: 146
Hemoglobin A1C: 6.7 % — AB (ref 4.0–5.6)

## 2020-03-08 MED ORDER — ONDANSETRON HCL 4 MG PO TABS: 4.0000 mg | ORAL_TABLET | Freq: Three times a day (TID) | ORAL | 0 refills | Status: DC | PRN

## 2020-03-08 NOTE — Telephone Encounter (Signed)
Requested medication (s) are due for refill today:   Requested medication (s) are on the active medication list: yes  Last refill:  02/20/20  Future visit scheduled: yes, schediuled 03/08/20  Notes to clinic: not delegated    Requested Prescriptions  Pending Prescriptions Disp Refills   promethazine (PHENERGAN) 12.5 MG tablet [Pharmacy Med Name: PROMETHAZINE HCL 12.5 MG TAB] 30 tablet 0    Sig: TAKE 1 TABLET BY MOUTH EVERY 8 HOURS AS NEEDED FOR NAUSEA OR VOMITING      Not Delegated - Gastroenterology: Antiemetics Failed - 03/08/2020 11:00 AM      Failed - This refill cannot be delegated      Failed - Valid encounter within last 6 months    Recent Outpatient Visits           Today    [facility], [place]   5 months ago Annual physical exam   [place], [place], PA-C   6 months ago Chronic obstructive pulmonary disease, unspecified COPD type [facility])   [facility], [place], [place]

## 2020-03-08 NOTE — Progress Notes (Signed)
Patient: Chelsea Ramsey Female    DOB: 1966-07-04   54 y.o.   MRN: [id number] Visit Date: 03/12/2020  Today's Provider: Trinna Post, PA-C   Chief Complaint  Patient presents with  . Pre-diabetes  . Knee Pain   Subjective:    I, Porsha McClurkin,CMA am acting as a scribe for [employer].   Knee Pain  The incident occurred more than 1 week ago. There was no injury mechanism. The pain is present in the left knee. The pain is at a severity of 7/10. The pain is severe. The pain has been constant since onset. Associated symptoms include an inability to bear weight. Pertinent negatives include no loss of motion, loss of sensation, numbness or tingling. She reports no foreign bodies present. The symptoms are aggravated by movement and weight bearing. She has tried acetaminophen for the symptoms. The treatment provided no relief.   Patient was recently dismissed from [facility] pain regional for inappropriate urine drug screen which was positive for hydrocodone when patient was receiving oxycodone. She was also noted to be using more Valium more than she admitted when she was called in for a pill count. She was supposed to have upwards of 300 pills according to her usage and she had only around 150 pills. She was being treated for bilateral osteoarthritis and low back pain. She was not found to be a candidate for interventions due to severe obesity. 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 She reports her pain has been so bad since tapering off the medication. She reports it hurts to the extent that she is unable to get up to go to the bathroom and will be incontinent of urine at times.    Pre-Diabetes Patient presents today for pre-diabetes. This has progressed into diabetes. Patient is severely obese with a BMI of 52.   Lab Results  Component Value Date   HGBA1C 6.7 (A) 03/08/2020   HGBA1C 6.2 (H) 08/24/2019   HGBA1C 5.2 08/21/2014   Patient reports continued  nausea associated with vertigo. She has been evaluated extensively with ENT and neurology for this. She previously received phenergan from this clinic and she is requesting more.    Allergies  Allergen Reactions  . Amiodarone Nausea And Vomiting  . Aspirin Swelling  . Flexeril [Cyclobenzaprine] Swelling  . Trazamine [Trazodone & Diet Manage Prod] Nausea And Vomiting  . Codeine Rash  . Tramadol Rash     Current Outpatient Medications:  .  albuterol (PROAIR HFA) 108 (90 Base) MCG/ACT inhaler, Inhale 1-2 puffs into the lungs every 6 (six) hours as needed for wheezing or shortness of breath., Disp: , Rfl:  .  allopurinol (ZYLOPRIM) 100 MG tablet, Take 1 tablet (100 mg total) by mouth 2 (two) times daily., Disp: 60 tablet, Rfl: 5 .  budesonide (PULMICORT) 0.5 MG/2ML nebulizer solution, Inhale into the lungs., Disp: , Rfl:  .  butalbital-acetaminophen-caffeine (FIORICET, ESGIC) 50-325-40 MG tablet, Take 1 tablet by mouth every 6 (six) hours as needed. , Disp: , Rfl:  .  Calcium Carbonate-Vit D-Min (GNP CALCIUM 1200) 1200-1000 MG-UNIT CHEW, Chew 1,200 mg by mouth daily with breakfast. Take in combination with vitamin D and magnesium., Disp: 90 tablet, Rfl: 3 .  Cholecalciferol (D 5000) 125 MCG (5000 UT) capsule, Take by mouth daily. , Disp: , Rfl:  .  colchicine 0.6 MG tablet, Take 1 tablet (0.6 mg total) by mouth daily. As directed, Disp: 30 tablet, Rfl: 5 .  furosemide (LASIX) 40 MG tablet, Take 2 tablets in the am and 1 tablet at night., Disp: 90 tablet, Rfl: 5 .  gabapentin (NEURONTIN) 600 MG tablet, Take 0.5 tablets (300 mg total) by mouth 2 (two) times daily. (Patient taking differently: Take 600 mg by mouth 3 (three) times daily. ), Disp: 60 tablet, Rfl: 0 .  ipratropium-albuterol (DUONEB) 0.5-2.5 (3) MG/3ML SOLN, INHALE THE CONTENTS OF 1 VIAL(3MLS) VIA NEBULIZER 3 TIMES DAILY AS NEEDED, Disp: 360 mL, Rfl: 1 .  lactulose (CHRONULAC) 10 GM/15ML solution, Take 45 mLs (30 g total) by mouth 2  (two) times daily as needed for mild constipation., Disp: 240 mL, Rfl: 0 .  LINZESS 290 MCG CAPS capsule, TAKE 1 CAPSULE BY MOUTH ONCE DAILY BEFORE BREAKFAST., Disp: 30 capsule, Rfl: 1 .  meclizine (ANTIVERT) 12.5 MG tablet, TAKE 2 TABLETS (25 MG TOTAL) BY MOUTH 3 TIMES DAILY AS NEEDED FOR DIZZINESS, Disp: 60 tablet, Rfl: 0 .  metolazone (ZAROXOLYN) 2.5 MG tablet, Take 2.5 mg by mouth. Three times weekly, Disp: , Rfl:  .  metoprolol tartrate (LOPRESSOR) 100 MG tablet, Take 1 tablet (100 mg total) by mouth 2 (two) times daily., Disp: 60 tablet, Rfl: 3 .  nitroGLYCERIN (NITROSTAT) 0.4 MG SL tablet, Place 1 tablet (0.4 mg total) under the tongue every 5 (five) minutes as needed for chest pain., Disp: 30 tablet, Rfl: 12 .  nortriptyline (PAMELOR) 10 MG capsule, Take 20 mg by mouth at bedtime. , Disp: , Rfl:  .  omeprazole (PRILOSEC) 20 MG capsule, TAKE 2 CAPSULES BY MOUTH TWICE A DAY BEFORE A MEAL, Disp: 120 capsule, Rfl: 2 .  OXYGEN, Inhale 4 L/L into the lungs., Disp: , Rfl:  .  potassium chloride (KLOR-CON) 10 MEQ tablet, Take 4 tablets (40 mEq) by mouth twice daily. Take 2 additional tablets on days you take metolazone., Disp: , Rfl:  .  potassium chloride SA (KLOR-CON) 20 MEQ tablet, Take 2 tablets (40 mEq total) by mouth 2 (two) times daily. And additional tablet when taking metolazone, Disp: 130 tablet, Rfl: 5 .  promethazine (PHENERGAN) 12.5 MG tablet, Take 1 tablet (12.5 mg total) by mouth every 8 (eight) hours as needed for nausea or vomiting., Disp: 30 tablet, Rfl: 0 .  rivaroxaban (XARELTO) 20 MG TABS tablet, Take 20 mg by mouth daily with supper., Disp: , Rfl:  .  tiZANidine (ZANAFLEX) 4 MG capsule, Take 4 mg by mouth at bedtime., Disp: , Rfl:  .  zolpidem (AMBIEN) 5 MG tablet, Take 5 mg by mouth at bedtime as needed for sleep., Disp: , Rfl:  .  ondansetron (ZOFRAN) 4 MG tablet, Take 1 tablet (4 mg total) by mouth every 8 (eight) hours as needed for nausea or vomiting., Disp: 20 tablet, Rfl:  0  Review of Systems  Constitutional: Negative.   HENT: Negative.   Eyes: Negative.   Respiratory: Negative.   Cardiovascular: Negative.   Gastrointestinal: Negative.   Endocrine: Negative.   Genitourinary: Negative.   Musculoskeletal: Negative.   Skin: Negative.   Allergic/Immunologic: Negative.   Neurological: Negative.  Negative for tingling and numbness.  Hematological: Negative.   Psychiatric/Behavioral: Negative.     Social History   Tobacco Use  . Smoking status: Current Every Day Smoker    Packs/day: 0.10    Types: Cigarettes  . Smokeless tobacco: Never Used  . Tobacco comment: 4/20 was not able to quit.  Still at 1-3 a day. She would like to wait to set a new quit date  until we get back to class to have the extra in person support  Substance Use Topics  . Alcohol use: Never    Alcohol/week: 3.0 standard drinks    Types: 3 Cans of beer per week    Comment: 16 oz per week      Objective:   BP 117/74 (BP Location: Left Arm, Patient Position: Sitting, Cuff Size: Large)   Pulse 82   Temp (!) 96.9 F (36.1 C) (Temporal)   Wt (!) 306 lb 6.4 oz (139 kg)   LMP 11/08/2009 Comment: menopause  BMI 52.59 kg/m  Vitals:   03/08/20 1539  BP: 117/74  Pulse: 82  Temp: (!) 96.9 F (36.1 C)  TempSrc: Temporal  Weight: (!) 306 lb 6.4 oz (139 kg)  Body mass index is 52.59 kg/m.   Physical Exam Constitutional:      Appearance: Normal appearance.  Cardiovascular:     Rate and Rhythm: Normal rate and regular rhythm.     Heart sounds: Normal heart sounds.  Pulmonary:     Effort: Pulmonary effort is normal.     Breath sounds: Normal breath sounds.  Skin:    General: Skin is warm and dry.  Neurological:     Mental Status: She is alert and oriented to person, place, and time. Mental status is at baseline.  Psychiatric:        Mood and Affect: Mood normal.        Behavior: Behavior normal.      Results for orders placed or performed in visit on 03/08/20  Urine  Microalbumin w/creat. ratio  Result Value Ref Range   Creatinine, Urine 112.4 Not Estab. mg/dL   Microalbumin, Urine 9.1 Not Estab. ug/mL   Microalb/Creat Ratio 8 0 - 29 mg/g creat  POCT glycosylated hemoglobin (Hb A1C)  Result Value Ref Range   Hemoglobin A1C 6.7 (A) 4.0 - 5.6 %   HbA1c POC (<> result, manual entry)     HbA1c, POC (prediabetic range)     HbA1c, POC (controlled diabetic range)     Est. average glucose Bld gHb Est-mCnc 146        Assessment & Plan    1. Type 2 diabetes mellitus without complication, without long-term current use of insulin (HCC)  Counseled this has progressed into diabetes. Refer as below. Meter supplies sent into local pharmacy. Counseled weight loss would likely reverse this. Follow up for remainder of diabetes care including statin. May consider metformin if sugars not controlled.   - POCT glycosylated hemoglobin (Hb A1C) - Urine Microalbumin w/creat. ratio - Ambulatory referral to Ophthalmology - Ambulatory referral to Chronic Care Management Services  2. 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 She is very tearful in the exam room describing her dismissal from pain management. She asks me to refill her pain medicine for any time I am willing. I have declined this and explained to patient that I do not prescribe this medication and she will need to be formally monitored. I have placed a referral to a new pain management clinic.   - Ambulatory referral to Pain Clinic  3. Nausea  I have explained to patient that phenergan was not a long term medication and she has been referred to multiple specialists to manage her dizziness/vertigo symptoms including ENT and neurology. These specialists should be treating her symptoms. I will refill zofran for nausea.   - ondansetron (ZOFRAN) 4 MG tablet; Take 1 tablet (4 mg total)  by mouth every 8 (eight) hours as needed for nausea or vomiting.  Dispense:  20 tablet; Refill: 0  The entirety of the information documented in the History of Present Illness, Review of Systems and Physical Exam were personally obtained by me. Portions of this information were initially documented by [facility] and reviewed by me for thoroughness and accuracy.      Trinna Post, PA-C  [facility]

## 2020-03-08 NOTE — Patient Instructions (Signed)
Diabetes Mellitus and Exercise Exercising regularly is important for your overall health, especially when you have diabetes (diabetes mellitus). Exercising is not only about losing weight. It has many other health benefits, such as increasing muscle strength and bone density and reducing body fat and stress. This leads to improved fitness, flexibility, and endurance, all of which result in better overall health. Exercise has additional benefits for people with diabetes, including:  Reducing appetite.  Helping to lower and control blood glucose.  Lowering blood pressure.  Helping to control amounts of fatty substances (lipids) in the blood, such as cholesterol and triglycerides.  Helping the body to respond better to insulin (improving insulin sensitivity).  Reducing how much insulin the body needs.  Decreasing the risk for heart disease by: ? Lowering cholesterol and triglyceride levels. ? Increasing the levels of good cholesterol. ? Lowering blood glucose levels. What is my activity plan? Your health care provider or certified diabetes educator can help you make a plan for the type and frequency of exercise (activity plan) that works for you. Make sure that you:  Do at least 150 minutes of moderate-intensity or vigorous-intensity exercise each week. This could be brisk walking, biking, or water aerobics. ? Do stretching and strength exercises, such as yoga or weightlifting, at least 2 times a week. ? Spread out your activity over at least 3 days of the week.  Get some form of physical activity every day. ? Do not go more than 2 days in a row without some kind of physical activity. ? Avoid being inactive for more than 30 minutes at a time. Take frequent breaks to walk or stretch.  Choose a type of exercise or activity that you enjoy, and set realistic goals.  Start slowly, and gradually increase the intensity of your exercise over time. What do I need to know about managing my  diabetes?   Check your blood glucose before and after exercising. ? If your blood glucose is 240 mg/dL (13.3 mmol/L) or higher before you exercise, check your urine for ketones. If you have ketones in your urine, do not exercise until your blood glucose returns to normal. ? If your blood glucose is 100 mg/dL (5.6 mmol/L) or lower, eat a snack containing 15-20 grams of carbohydrate. Check your blood glucose 15 minutes after the snack to make sure that your level is above 100 mg/dL (5.6 mmol/L) before you start your exercise.  Know the symptoms of low blood glucose (hypoglycemia) and how to treat it. Your risk for hypoglycemia increases during and after exercise. Common symptoms of hypoglycemia can include: ? Hunger. ? Anxiety. ? Sweating and feeling clammy. ? Confusion. ? Dizziness or feeling light-headed. ? Increased heart rate or palpitations. ? Blurry vision. ? Tingling or numbness around the mouth, lips, or tongue. ? Tremors or shakes. ? Irritability.  Keep a rapid-acting carbohydrate snack available before, during, and after exercise to help prevent or treat hypoglycemia.  Avoid injecting insulin into areas of the body that are going to be exercised. For example, avoid injecting insulin into: ? The arms, when playing tennis. ? The legs, when jogging.  Keep records of your exercise habits. Doing this can help you and your health care provider adjust your diabetes management plan as needed. Write down: ? Food that you eat before and after you exercise. ? Blood glucose levels before and after you exercise. ? The type and amount of exercise you have done. ? When your insulin is expected to peak, if you use   insulin. Avoid exercising at times when your insulin is peaking.  When you start a new exercise or activity, work with your health care provider to make sure the activity is safe for you, and to adjust your insulin, medicines, or food intake as needed.  Drink plenty of water while  you exercise to prevent dehydration or heat stroke. Drink enough fluid to keep your urine clear or pale yellow. Summary  Exercising regularly is important for your overall health, especially when you have diabetes (diabetes mellitus).  Exercising has many health benefits, such as increasing muscle strength and bone density and reducing body fat and stress.  Your health care provider or certified diabetes educator can help you make a plan for the type and frequency of exercise (activity plan) that works for you.  When you start a new exercise or activity, work with your health care provider to make sure the activity is safe for you, and to adjust your insulin, medicines, or food intake as needed. This information is not intended to replace advice given to you by your health care provider. Make sure you discuss any questions you have with your health care provider. Document Revised: 06/10/2017 Document Reviewed: 04/27/2016 Elsevier Patient Education  2020 [employer]  

## 2020-03-08 NOTE — Telephone Encounter (Signed)
I have declined this already. Please see previous note.

## 2020-03-09 LAB — MICROALBUMIN / CREATININE URINE RATIO
Creatinine, Urine: 112.4 mg/dL
Microalb/Creat Ratio: 8 mg/g creat (ref 0–29)
Microalbumin, Urine: 9.1 ug/mL

## 2020-03-11 ENCOUNTER — Telehealth: Payer: Medicaid Other | Admitting: Pain Medicine

## 2020-03-13 ENCOUNTER — Telehealth: Payer: Self-pay

## 2020-03-13 NOTE — Telephone Encounter (Signed)
-----   Message from Trinna Post, [place] sent at 03/11/2020  2:09 PM EDT ----- Urine protein is normal. Can we please fax over a hard script for a glucometer. Can we write out prescription for one glucometer (whatever insurance pays for), strips to check fasting blood sugar once daily and lancets. Please list dx code as well.

## 2020-03-13 NOTE — Telephone Encounter (Signed)
Patient was advised and rx written for glucometer, strips and lancets.

## 2020-03-15 ENCOUNTER — Telehealth: Payer: Self-pay | Admitting: Physician Assistant

## 2020-03-15 NOTE — Chronic Care Management (AMB) (Signed)
  Care Management   Note  03/15/2020 Name: Juniya Maute MRN: [id number] DOB: 1966-05-04  [name] is a 54 y.o. year old female who is a primary care patient of Trinna Post, [place]. I reached out to [name] by phone today in response to a referral sent by Ms. Tomma Lightning Zagami's health plan.    Ms. Mild was given information about care management services today including:  1. Care management services include personalized support from designated clinical staff supervised by her physician, including individualized plan of care and coordination with other care providers 2. 24/7 contact phone numbers for assistance for urgent and routine care needs. 3. The patient may stop care management services at any time by phone call to the office staff.  Patient agreed to services and verbal consent obtained.   Follow up plan: Telephone appointment with care management team member scheduled for:03/19/2020  Glenna Durand, LPN Health Advisor, [facility] ??[email or web address] ??[phone]

## 2020-03-19 ENCOUNTER — Ambulatory Visit: Payer: Medicaid Other

## 2020-03-19 DIAGNOSIS — E119 Type 2 diabetes mellitus without complications: Secondary | ICD-10-CM

## 2020-03-19 DIAGNOSIS — I10 Essential (primary) hypertension: Secondary | ICD-10-CM

## 2020-03-19 DIAGNOSIS — I509 Heart failure, unspecified: Secondary | ICD-10-CM

## 2020-03-19 DIAGNOSIS — J449 Chronic obstructive pulmonary disease, unspecified: Secondary | ICD-10-CM

## 2020-03-19 DIAGNOSIS — R42 Dizziness and giddiness: Secondary | ICD-10-CM

## 2020-03-19 NOTE — Chronic Care Management (AMB) (Signed)
Care Management   Initial Visit Note  03/19/2020 Name: Chelsea Ramsey MRN: [id number] DOB: [birth year]    Chelsea Ramsey is a 54 y.o. year old female who sees Trinna Post, [place] for primary care. The care management team was consulted for assistance with care management and care coordination. A telephonic assessment was conducted today.   Ms. Wishard reports receiving the Covid-19 vaccination.  Review of Ms. Burch's status, including review of consultants reports, relevant labs and test results was conducted today. Collaboration with appropriate care team members was performed as part of the comprehensive evaluation and provision of chronic care management services.     SDOH (Social Determinants of Health) assessments performed: Yes See Care Plan activities for detailed interventions related to SDOH)  SDOH Interventions     Most Recent Value  SDOH Interventions  SDOH Interventions for the Following Domains  Transportation [Info resubmitted for 03/19/20.]  Transportation Interventions  Other (Comment) [Hx of transportation needs. Patient does not drive and may require assistance with future appts. Will follow-up and submit Care Guide referral if needed.]         Outpatient Encounter Medications as of 03/19/2020  Medication Sig  . albuterol (PROAIR HFA) 108 (90 Base) MCG/ACT inhaler Inhale 1-2 puffs into the lungs every 6 (six) hours as needed for wheezing or shortness of breath.  . allopurinol (ZYLOPRIM) 100 MG tablet Take 1 tablet (100 mg total) by mouth 2 (two) times daily.  . budesonide (PULMICORT) 0.5 MG/2ML nebulizer solution Inhale into the lungs.  . butalbital-acetaminophen-caffeine (FIORICET, ESGIC) 50-325-40 MG tablet Take 1 tablet by mouth every 6 (six) hours as needed.   . meclizine (ANTIVERT) 12.5 MG tablet TAKE 2 TABLETS (25 MG TOTAL) BY MOUTH 3 TIMES DAILY AS NEEDED FOR DIZZINESS  . Calcium Carbonate-Vit D-Min (GNP CALCIUM 1200) 1200-1000  MG-UNIT CHEW Chew 1,200 mg by mouth daily with breakfast. Take in combination with vitamin D and magnesium.  . Cholecalciferol (D 5000) 125 MCG (5000 UT) capsule Take by mouth daily.   . colchicine 0.6 MG tablet Take 1 tablet (0.6 mg total) by mouth daily. As directed  . furosemide (LASIX) 40 MG tablet Take 2 tablets in the am and 1 tablet at night.  . gabapentin (NEURONTIN) 600 MG tablet Take 0.5 tablets (300 mg total) by mouth 2 (two) times daily. (Patient taking differently: Take 600 mg by mouth 3 (three) times daily. )  . ipratropium-albuterol (DUONEB) 0.5-2.5 (3) MG/3ML SOLN INHALE THE CONTENTS OF 1 VIAL(3MLS) VIA NEBULIZER 3 TIMES DAILY AS NEEDED  . lactulose (CHRONULAC) 10 GM/15ML solution Take 45 mLs (30 g total) by mouth 2 (two) times daily as needed for mild constipation.  Marland Kitchen LINZESS 290 MCG CAPS capsule TAKE 1 CAPSULE BY MOUTH ONCE DAILY BEFORE BREAKFAST.  Marland Kitchen metolazone (ZAROXOLYN) 2.5 MG tablet Take 2.5 mg by mouth. Three times weekly  . metoprolol tartrate (LOPRESSOR) 100 MG tablet Take 1 tablet (100 mg total) by mouth 2 (two) times daily.  . nitroGLYCERIN (NITROSTAT) 0.4 MG SL tablet Place 1 tablet (0.4 mg total) under the tongue every 5 (five) minutes as needed for chest pain.  . nortriptyline (PAMELOR) 10 MG capsule Take 20 mg by mouth at bedtime.   Marland Kitchen omeprazole (PRILOSEC) 20 MG capsule TAKE 2 CAPSULES BY MOUTH TWICE A DAY BEFORE A MEAL  . ondansetron (ZOFRAN) 4 MG tablet Take 1 tablet (4 mg total) by mouth every 8 (eight) hours as needed for nausea or vomiting.  . OXYGEN Inhale 4 L/L  into the lungs.  . potassium chloride (KLOR-CON) 10 MEQ tablet Take 4 tablets (40 mEq) by mouth twice daily. Take 2 additional tablets on days you take metolazone.  . potassium chloride SA (KLOR-CON) 20 MEQ tablet Take 2 tablets (40 mEq total) by mouth 2 (two) times daily. And additional tablet when taking metolazone  . promethazine (PHENERGAN) 12.5 MG tablet Take 1 tablet (12.5 mg total) by mouth every 8  (eight) hours as needed for nausea or vomiting.  . rivaroxaban (XARELTO) 20 MG TABS tablet Take 20 mg by mouth daily with supper.  Marland Kitchen tiZANidine (ZANAFLEX) 4 MG capsule Take 4 mg by mouth at bedtime.  Marland Kitchen zolpidem (AMBIEN) 5 MG tablet Take 5 mg by mouth at bedtime as needed for sleep.   No facility-administered encounter medications on file as of 03/19/2020.    BP Readings from Last 3 Encounters:  03/08/20 117/74  01/31/20 113/65  12/14/19 106/73   Lab Results  Component Value Date   HGBA1C 6.7 (A) 03/08/2020   Lab Results  Component Value Date   CHOL 96 04/03/2014   HDL 32 (A) 04/03/2014   LDLCALC 51 04/03/2014   TRIG 63 04/03/2014   Wt Readings from Last 3 Encounters:  03/08/20 (!) 306 lb 6.4 oz (139 kg)  01/31/20 290 lb (131.5 kg)  12/11/19 289 lb 9 oz (131.3 kg)    Goals Addressed            This Visit's Progress   . Chronic Disease Management       CARE PLAN ENTRY (see longitudinal plan of care for additional care plan information)  Current Barriers:  . Chronic Disease Management support and education needs related to Hypertension, Diabetes, Congestive Heart Failure and COPD. (Hx of chronic pain and vertigo)  Case Manager Clinical Goal(s):  Marland Kitchen Over the next 90 days, patient will not require hospital admission due to complications r/t chronic illnesses. . Over the next 90 days, patient will take all medications as prescribed.  . Over the next 90 days, patient will attend all scheduled medical appointments. . Over the next 90 days, patient will monitor BP and Ramsey readings. . Over the next 90 days, patient will weigh at least three times a week and maintain a log. . Over the next 90 days, patient will adhere to provider's recommendations r/t blood glucose monitoring and Diabetes management. . Over the next 90 days, patient will adhere to recommended safety and fall prevention measures. . Over the next 30 days, patient will work with care management team and [facility] to obtain required respiratory supplies.   Interventions:  . Inter-disciplinary care team collaboration (see longitudinal plan of care) . Reviewed medications and indications for use. Encouraged to take all medications as prescribed and notify assigned provider if unable to tolerate prescribed regimen. Encouraged to notify care management team with concerns regarding medication management and prescription costs. Reports self administering medications. May require a pill box to assist with medication management. Denies current concerns regarding prescription costs.  . Provided eduction regarding established blood pressure parameters and indications for notifying provider. Encouraged to monitor daily and Ramsey readings. Reports monitoring routinely. Recent reading of 121/91. Marland Kitchen Discussed s/sx of CHF related complications. Encouraged to attempt to weigh daily and inform provider of weight gain greater than 3lbs overnight or 5lbs within a week. Encouraged to weigh at least three times a week if unable to weight daily. Reports weight usually ranges from 307 lbs to 311 lbs. Will attempt to  monitor and Ramsey readings. . Discussed current plan for COPD self-management. Reports using home oxygen as prescribed. Reports current setting of 4L/min. Experienced occasional episodes of shortness of breath. Reports episodes were triggered by the heat and increased pollen. Expressed concerns regarding oxygen concentrator. Will follow up with [facility] to determine if a new device is required. . Reviewed provider's recommendations r/t Diabetes management. Discussed s/sx of hypoglycemia and hyperglycemia along with indications for notifying provider. Encouraged to monitor fasting levels daily and maintain a log. Confirmed receipt of glucometer. Reports fasting range from 111-142mg /dl. . Provided education regarding safety and fall prevention. Encouraged to use assistive device when ambulating. Encouraged to update  team if additional assistance is needed in the home. Reports history of falls.  . Reviewed scheduled appointments. Encouraged to attend medical appointments as scheduled to prevent delays in care. Encouraged to notify team with concerns regarding transportation.  . Discussed plans for ongoing care management and follow up. Provided direct contact information.    Patient Self Care Activities:  . Self administers medications as prescribed . Attends all scheduled provider appointments . Calls pharmacy for medication refills . Performs ADL's independently . Requires driver for transportation.   Initial goal documentation          Ms. Nile was given information about Care Management services  including:  1. Care Management services include personalized support from designated clinical staff supervised by a physician, including individualized plan of care and coordination with other care providers 2. 24/7 contact phone numbers for assistance for urgent and routine care needs. 3. The patient may stop Care Management services at any time (effective at the end of the month) by phone call to the office staff.  Patient agreed to services and verbal consent obtained.    PLAN The care management team will follow-up with Ms. Fazekas within the next two to three weeks.   Horris Latino [facility] Care Management [phone]

## 2020-03-19 NOTE — Patient Instructions (Addendum)
Thank you for allowing the Chronic Care Management team to participate in your care.  Goals Addressed            This Visit's Progress   . Chronic Disease Management       CARE PLAN ENTRY (see longitudinal plan of care for additional care plan information)  Current Barriers:  . Chronic Disease Management support and education needs related to Hypertension, Diabetes, Congestive Heart Failure and COPD. (Hx of chronic pain and vertigo)  Case Manager Clinical Goal(s):  Marland Kitchen Over the next 90 days, patient will not require hospital admission due to complications r/t chronic illnesses. . Over the next 90 days, patient will take all medications as prescribed.  . Over the next 90 days, patient will attend all scheduled medical appointments. . Over the next 90 days, patient will monitor BP and record readings. . Over the next 90 days, patient will weigh at least three times a week and maintain a log. . Over the next 90 days, patient will adhere to provider's recommendations r/t blood glucose monitoring and Diabetes management. . Over the next 90 days, patient will adhere to recommended safety and fall prevention measures. . Over the next 30 days, patient will work with care management team and [facility] to obtain required respiratory supplies.   Interventions:  . Inter-disciplinary care team collaboration (see longitudinal plan of care) . Reviewed medications and indications for use. Encouraged to take all medications as prescribed and notify assigned provider if unable to tolerate prescribed regimen. Encouraged to notify care management team with concerns regarding medication management and prescription costs. Reports self administering medications. May require a pill box to assist with medication management. Denies current concerns regarding prescription costs.  . Provided eduction regarding established blood pressure parameters and indications for notifying provider. Encouraged to monitor  daily and record readings. Reports monitoring routinely. Recent reading of 121/91. Marland Kitchen Discussed s/sx of CHF related complications. Encouraged to attempt to weigh daily and inform provider of weight gain greater than 3lbs overnight or 5lbs within a week. Encouraged to weigh at least three times a week if unable to weight daily. Reports weight usually ranges from 307 lbs to 311 lbs. Will attempt to monitor and record readings. . Discussed current plan for COPD self-management. Reports using home oxygen as prescribed. Reports current setting of 4L/min. Experienced occasional episodes of shortness of breath. Reports episodes were triggered by the heat and increased pollen. Expressed concerns regarding oxygen concentrator. Will follow up with [facility] to determine if a new device is required. . Reviewed provider's recommendations r/t Diabetes management. Discussed s/sx of hypoglycemia and hyperglycemia along with indications for notifying provider. Encouraged to monitor fasting levels daily and maintain a log. Confirmed receipt of glucometer. Reports fasting range from 111-142mg /dl. . Provided education regarding safety and fall prevention. Encouraged to use assistive device when ambulating. Encouraged to update team if additional assistance is needed in the home. Reports history of falls.  . Reviewed scheduled appointments. Encouraged to attend medical appointments as scheduled to prevent delays in care. Encouraged to notify team with concerns regarding transportation.  . Discussed plans for ongoing care management and follow up. Provided direct contact information.    Patient Self Care Activities:  . Self administers medications as prescribed . Attends all scheduled provider appointments . Calls pharmacy for medication refills . Performs ADL's independently . Requires driver for transportation.   Initial goal documentation          Chelsea Ramsey was given information  about Care Management  services  including:  1. Care Management services include personalized support from designated clinical staff supervised by a physician, including individualized plan of care and coordination with other care providers 2. 24/7 contact phone numbers for assistance for urgent and routine care needs. 3. The patient may stop Care Management services at any time (effective at the end of the month) by phone call to the office staff.  Patient agreed to services and verbal consent obtained.    Chelsea Ramsey verbalized understanding of the instructions provided during the telephonic outreach today. Declined need for a printed/mailed copy of the instructions.   The care management team will follow-up with Chelsea Ramsey within the next two to three weeks.    Horris Latino [facility] Care Management [phone]

## 2020-03-20 ENCOUNTER — Telehealth: Payer: Self-pay

## 2020-03-20 ENCOUNTER — Other Ambulatory Visit: Payer: Self-pay | Admitting: Family Medicine

## 2020-03-20 DIAGNOSIS — R42 Dizziness and giddiness: Secondary | ICD-10-CM

## 2020-03-20 NOTE — Telephone Encounter (Signed)
Patient's call was returned and patient states that she already spoke with the person that called her.

## 2020-03-20 NOTE — Telephone Encounter (Signed)
Copied from [place] [phone]. Topic: General - Other >> Mar 20, 2020  4:12 PM Keene Breath wrote: Reason for CRM: Patient stated she is returning a call to the practice.  She did not have a message or any other details.  Please call patient back at [phone]

## 2020-03-20 NOTE — Telephone Encounter (Signed)
Requested medication (s) are due for refill today:yes  Requested medication (s) are on the active medication list:yes  Last refill:  02/20/20  Future visit scheduled: yes  Notes to clinic:  Not delegated. Medication has been refused 4x last week.    Requested Prescriptions  Pending Prescriptions Disp Refills   promethazine (PHENERGAN) 12.5 MG tablet [Pharmacy Med Name: PROMETHAZINE HCL 12.5 MG TAB] 30 tablet 0    Sig: TAKE 1 TABLET BY MOUTH EVERY 8 HOURS AS NEEDED FOR NAUSEA OR VOMITING      Not Delegated - Gastroenterology: Antiemetics Failed - 03/20/2020  4:56 PM      Failed - This refill cannot be delegated      Passed - Valid encounter within last 6 months    Recent Outpatient Visits           1 week ago Type 2 diabetes mellitus without complication, without long-term current use of insulin [facility])   [place], [place], PA-C   5 months ago Annual physical exam   [facility] Carles Collet M, PA-C   6 months ago Chronic obstructive pulmonary disease, unspecified COPD type [facility])   [facility] Trinna Post, [place]       Future Appointments             In 2 months Trinna Post, PA-C [employer], [facility]

## 2020-03-26 ENCOUNTER — Ambulatory Visit: Payer: Self-pay

## 2020-03-27 ENCOUNTER — Ambulatory Visit: Payer: Self-pay | Admitting: Physician Assistant

## 2020-04-01 NOTE — Chronic Care Management (AMB) (Signed)
This encounter was created in error. Please disregard. 

## 2020-04-09 ENCOUNTER — Telehealth: Payer: Self-pay

## 2020-04-10 ENCOUNTER — Telehealth: Payer: Self-pay | Admitting: *Deleted

## 2020-04-10 NOTE — Telephone Encounter (Signed)
Copied from [place] [phone]. Topic: General - Other >> Apr 10, 2020  9:12 AM Rainey Pines A wrote: [name] from [place] with Medicaid called to see if someone could speak with patient to help her understand that she is going to [facility] for pain management and not primary care services. Please advise   [name] [phone]

## 2020-04-10 NOTE — Telephone Encounter (Signed)
Reached out to patient and she states that she no longer see Adriana. Patient states that she goes to [facility] for pain management and primary care.  Spoke w/ [name] @ [facility] [place] with [facility] and she states that [facility] only have notes for pain management and not primary care. I advised [name] that I spoke with the patient and she states that she will no longer see Fabio Bering and patient requested appointments to be cancelled. [name] states that she reviewed Fabio Bering notes and see that she was only referring the patient for pain management and I advised her again that patient stated she was switching to [facility] for both pain management and primary care. [name] states that she has to monitor the patients file and will send medication list and have Romelle Starcher to request any information from [facility] to better treat patient. Also advised [name] that [place] name is removed from patients chart. FYI

## 2020-04-11 ENCOUNTER — Telehealth: Payer: Self-pay

## 2020-04-11 ENCOUNTER — Ambulatory Visit: Payer: Self-pay

## 2020-04-11 NOTE — Chronic Care Management (AMB) (Signed)
  Care Management   Note  04/11/2020 Name: Chelsea Ramsey MRN: [id number] DOB: Nov 09, 1966   Brief outreach with [name]. Confirmed that she will be receiving Primary Care and Pain Management at [facility]. Denied urgent needs. Agreed to contact care management team if she requires assistance with transferring services.   PLAN [name] will receive ongoing care at [facility] Care [facility] [phone]

## 2020-04-23 ENCOUNTER — Other Ambulatory Visit: Payer: Self-pay | Admitting: Family

## 2020-04-24 ENCOUNTER — Telehealth (INDEPENDENT_AMBULATORY_CARE_PROVIDER_SITE_OTHER): Payer: Self-pay

## 2020-04-24 NOTE — Telephone Encounter (Signed)
Pt was returning someone from the office's call.

## 2020-04-26 ENCOUNTER — Other Ambulatory Visit: Payer: Self-pay | Admitting: Family Medicine

## 2020-04-26 DIAGNOSIS — R42 Dizziness and giddiness: Secondary | ICD-10-CM

## 2020-04-26 NOTE — Telephone Encounter (Signed)
Requested medication (s) are due for refill today - unknown  Requested medication (s) are on the active medication list -yes  Future visit scheduled -no  Last refill: 02/20/20  Notes to clinic: Request of non delegated Rx  Requested Prescriptions  Pending Prescriptions Disp Refills   promethazine (PHENERGAN) 12.5 MG tablet [Pharmacy Med Name: PROMETHAZINE HCL 12.5 MG TAB] 30 tablet 0    Sig: TAKE 1 TABLET BY MOUTH EVERY 8 HOURS AS NEEDED FOR NAUSEA OR VOMITING      Not Delegated - Gastroenterology: Antiemetics Failed - 04/26/2020 12:10 PM      Failed - This refill cannot be delegated      Passed - Valid encounter within last 6 months    Recent Outpatient Visits           1 month ago Type 2 diabetes mellitus without complication, without long-term current use of insulin [facility])   [facility] [place], Adriana M, PA-C   7 months ago Annual physical exam   [employer], Adriana M, PA-C   8 months ago Chronic obstructive pulmonary disease, unspecified COPD type [facility])   [facility] [place], Fabio Bering M, [place]                  Requested Prescriptions  Pending Prescriptions Disp Refills   promethazine (PHENERGAN) 12.5 MG tablet [Pharmacy Med Name: PROMETHAZINE HCL 12.5 MG TAB] 30 tablet 0    Sig: TAKE 1 TABLET BY MOUTH EVERY 8 HOURS AS NEEDED FOR NAUSEA OR VOMITING      Not Delegated - Gastroenterology: Antiemetics Failed - 04/26/2020 12:10 PM      Failed - This refill cannot be delegated      Passed - Valid encounter within last 6 months    Recent Outpatient Visits           1 month ago Type 2 diabetes mellitus without complication, without long-term current use of insulin [facility])   [facility] [place], [place], PA-C   7 months ago Annual physical exam   [facility] Carles Collet M, PA-C   8 months ago Chronic obstructive pulmonary disease, unspecified COPD type [facility])   [facility]  [place], [place], [place]

## 2020-04-26 NOTE — Telephone Encounter (Signed)
Patient is not longer a patient here so I have declined the medication. May need to call pharmacy and let them know so they stop requesting it from this clinic.

## 2020-05-10 ENCOUNTER — Other Ambulatory Visit: Payer: Self-pay | Admitting: Physician Assistant

## 2020-05-10 DIAGNOSIS — J449 Chronic obstructive pulmonary disease, unspecified: Secondary | ICD-10-CM

## 2020-05-10 NOTE — Telephone Encounter (Signed)
Requested medication (s) are due for refill today - unknown  Requested medication (s) are on the active medication list -no  Future visit scheduled -no  Last refill: 10/24/19  Notes to clinic: Patient requesting medication not on current medication list and not assigned protocol.  Requested Prescriptions  Pending Prescriptions Disp [place] 100-62.5-25 MCG/INH AEPB [Pharmacy Med Name: TRELEGY ELLIPTA 100-62.5-25 MCG/INH] 60 each 2    Sig: Inhale 1 puff into the lungs every morning.      Off-Protocol Failed - 05/10/2020  1:29 PM      Failed - Medication not assigned to a protocol, review manually.      Passed - Valid encounter within last 12 months    Recent Outpatient Visits           2 months ago Type 2 diabetes mellitus without complication, without long-term current use of insulin [name])   [place], [place], [place]   7 months ago Annual physical exam   [facility] Carles Collet M, PA-C   8 months ago Chronic obstructive pulmonary disease, unspecified COPD type [facility])   [facility] Carles Collet M, [place]                  Requested Prescriptions  Pending Prescriptions Disp Refills   TRELEGY ELLIPTA 100-62.5-25 MCG/INH AEPB [Pharmacy Med Name: TRELEGY ELLIPTA 100-62.5-25 MCG/INH] 60 each 2    Sig: Inhale 1 puff into the lungs every morning.      Off-Protocol Failed - 05/10/2020  1:29 PM      Failed - Medication not assigned to a protocol, review manually.      Passed - Valid encounter within last 12 months    Recent Outpatient Visits           2 months ago Type 2 diabetes mellitus without complication, without long-term current use of insulin [facility])   [place], [place], [place]   7 months ago Annual physical exam   [place], [place], PA-C   8 months ago Chronic obstructive pulmonary disease, unspecified COPD type [name])   [place], [place], [place]

## 2020-06-07 ENCOUNTER — Ambulatory Visit: Payer: Self-pay | Admitting: Physician Assistant

## 2020-06-10 ENCOUNTER — Ambulatory Visit: Payer: Medicaid Other | Admitting: Family

## 2020-06-22 ENCOUNTER — Other Ambulatory Visit: Payer: Self-pay | Admitting: Gastroenterology

## 2020-06-22 DIAGNOSIS — T402X5A Adverse effect of other opioids, initial encounter: Secondary | ICD-10-CM

## 2020-06-22 DIAGNOSIS — K5903 Drug induced constipation: Secondary | ICD-10-CM

## 2020-07-08 ENCOUNTER — Ambulatory Visit: Payer: Medicaid Other | Admitting: Family

## 2020-07-11 ENCOUNTER — Other Ambulatory Visit: Payer: Self-pay | Admitting: Physician Assistant

## 2020-08-29 ENCOUNTER — Other Ambulatory Visit: Payer: Self-pay | Admitting: Nurse Practitioner

## 2020-08-29 DIAGNOSIS — M5137 Other intervertebral disc degeneration, lumbosacral region: Secondary | ICD-10-CM

## 2020-09-10 ENCOUNTER — Ambulatory Visit
Admission: RE | Admit: 2020-09-10 | Discharge: 2020-09-10 | Disposition: A | Discharge status: Home / Self Care | Payer: Medicaid Other | Source: Ambulatory Visit | Attending: Nurse Practitioner | Admitting: Nurse Practitioner

## 2020-09-10 DIAGNOSIS — M5137 Other intervertebral disc degeneration, lumbosacral region: Secondary | ICD-10-CM

## 2020-09-19 ENCOUNTER — Other Ambulatory Visit: Payer: Self-pay | Admitting: Pain Medicine

## 2020-09-19 ENCOUNTER — Ambulatory Visit: Payer: Medicaid Other

## 2020-09-19 DIAGNOSIS — M1A9XX0 Chronic gout, unspecified, without tophus (tophi): Secondary | ICD-10-CM

## 2020-09-23 ENCOUNTER — Other Ambulatory Visit: Payer: Self-pay | Admitting: Pain Medicine

## 2020-09-23 DIAGNOSIS — M1A9XX0 Chronic gout, unspecified, without tophus (tophi): Secondary | ICD-10-CM

## 2020-09-26 ENCOUNTER — Ambulatory Visit: Payer: Medicaid Other | Attending: Neurology

## 2020-11-02 ENCOUNTER — Emergency Department: Payer: Medicaid Other

## 2020-11-02 ENCOUNTER — Other Ambulatory Visit: Payer: Self-pay

## 2020-11-02 ENCOUNTER — Encounter: Payer: Self-pay | Admitting: Emergency Medicine

## 2020-11-02 DIAGNOSIS — Z96651 Presence of right artificial knee joint: Secondary | ICD-10-CM | POA: Diagnosis not present

## 2020-11-02 DIAGNOSIS — Z951 Presence of aortocoronary bypass graft: Secondary | ICD-10-CM | POA: Insufficient documentation

## 2020-11-02 DIAGNOSIS — R0602 Shortness of breath: Secondary | ICD-10-CM | POA: Diagnosis present

## 2020-11-02 DIAGNOSIS — R1110 Vomiting, unspecified: Secondary | ICD-10-CM | POA: Diagnosis not present

## 2020-11-02 DIAGNOSIS — J441 Chronic obstructive pulmonary disease with (acute) exacerbation: Secondary | ICD-10-CM | POA: Diagnosis not present

## 2020-11-02 DIAGNOSIS — F17210 Nicotine dependence, cigarettes, uncomplicated: Secondary | ICD-10-CM | POA: Insufficient documentation

## 2020-11-02 DIAGNOSIS — Z79899 Other long term (current) drug therapy: Secondary | ICD-10-CM | POA: Diagnosis not present

## 2020-11-02 DIAGNOSIS — S20211A Contusion of right front wall of thorax, initial encounter: Secondary | ICD-10-CM | POA: Insufficient documentation

## 2020-11-02 DIAGNOSIS — I2510 Atherosclerotic heart disease of native coronary artery without angina pectoris: Secondary | ICD-10-CM | POA: Diagnosis not present

## 2020-11-02 DIAGNOSIS — I110 Hypertensive heart disease with heart failure: Secondary | ICD-10-CM | POA: Diagnosis not present

## 2020-11-02 DIAGNOSIS — W06XXXA Fall from bed, initial encounter: Secondary | ICD-10-CM | POA: Diagnosis not present

## 2020-11-02 DIAGNOSIS — R0782 Intercostal pain: Secondary | ICD-10-CM | POA: Insufficient documentation

## 2020-11-02 DIAGNOSIS — R7303 Prediabetes: Secondary | ICD-10-CM | POA: Insufficient documentation

## 2020-11-02 DIAGNOSIS — I509 Heart failure, unspecified: Secondary | ICD-10-CM | POA: Insufficient documentation

## 2020-11-02 LAB — CBC
HCT: 34.3 % — ABNORMAL LOW (ref 36.0–46.0)
Hemoglobin: 9.8 g/dL — ABNORMAL LOW (ref 12.0–15.0)
MCH: 23.1 pg — ABNORMAL LOW (ref 26.0–34.0)
MCHC: 28.6 g/dL — ABNORMAL LOW (ref 30.0–36.0)
MCV: 80.9 fL (ref 80.0–100.0)
Platelets: 234 10*3/uL (ref 150–400)
RBC: 4.24 MIL/uL (ref 3.87–5.11)
RDW: 17.2 % — ABNORMAL HIGH (ref 11.5–15.5)
WBC: 7.3 10*3/uL (ref 4.0–10.5)
nRBC: 0 % (ref 0.0–0.2)

## 2020-11-02 LAB — BASIC METABOLIC PANEL
Anion gap: 10 (ref 5–15)
BUN: 6 mg/dL (ref 6–20)
CO2: 32 mmol/L (ref 22–32)
Calcium: 8.7 mg/dL — ABNORMAL LOW (ref 8.9–10.3)
Chloride: 97 mmol/L — ABNORMAL LOW (ref 98–111)
Creatinine, Ser: 0.54 mg/dL (ref 0.44–1.00)
GFR, Estimated: >60 mL/min (ref >60)
Glucose, Bld: 118 mg/dL — ABNORMAL HIGH (ref 70–99)
Potassium: 3.5 mmol/L (ref 3.5–5.1)
Sodium: 139 mmol/L (ref 135–145)

## 2020-11-02 LAB — TROPONIN I (HIGH SENSITIVITY)
Troponin I (High Sensitivity): 10 ng/L (ref <18)
Troponin I (High Sensitivity): 10 ng/L (ref <18)

## 2020-11-02 NOTE — ED Triage Notes (Signed)
Pt to ED via EMS with c/o falling out of her bed 3 days ago. Pt c/o R sided rib pain. Pt states pain worsens with taking deep breaths. Pt also states feels like is having a COPD exacerbation. Pt also c/o 2 episodes of vomiting. Pt states wears 5L O2 chronic for end stage COPD and CHF.   Pt able to speak in full and complete sentences in triage.

## 2020-11-02 NOTE — ED Notes (Signed)
Pt's oxygen tank changed out at this time. Asking about wait times, pt is short of breath, tachypneic. Pt talking on phone at this time.

## 2020-11-02 NOTE — ED Notes (Signed)
To ED via [facility] from home with difficulty breathing. Normallly on 4-5 liters of oxygen at home. Can't take a deep breath without pain. 141/80, pulse 90, RR 16, 100% on 4-5L CBG 97

## 2020-11-03 ENCOUNTER — Emergency Department
Admission: EM | Admit: 2020-11-03 | Discharge: 2020-11-03 | Disposition: A | Discharge status: Home / Self Care | Payer: Medicaid Other | Attending: Emergency Medicine | Admitting: Emergency Medicine

## 2020-11-03 ENCOUNTER — Emergency Department: Payer: Medicaid Other

## 2020-11-03 DIAGNOSIS — R0781 Pleurodynia: Secondary | ICD-10-CM

## 2020-11-03 DIAGNOSIS — S20211A Contusion of right front wall of thorax, initial encounter: Secondary | ICD-10-CM

## 2020-11-03 DIAGNOSIS — J441 Chronic obstructive pulmonary disease with (acute) exacerbation: Secondary | ICD-10-CM

## 2020-11-03 MED ORDER — PREDNISONE 20 MG PO TABS
60.0000 mg | ORAL_TABLET | Freq: Once | ORAL | Status: AC
  Administered 2020-11-03: 60 mg via ORAL
  Filled 2020-11-03: qty 3

## 2020-11-03 MED ORDER — OXYCODONE-ACETAMINOPHEN 5-325 MG PO TABS
1.0000 | ORAL_TABLET | Freq: Once | ORAL | Status: AC
  Administered 2020-11-03: 1 via ORAL
  Filled 2020-11-03: qty 1

## 2020-11-03 MED ORDER — PREDNISONE 20 MG PO TABS: ORAL_TABLET | ORAL | 0 refills | Status: DC

## 2020-11-03 MED ORDER — IPRATROPIUM-ALBUTEROL 0.5-2.5 (3) MG/3ML IN SOLN
3.0000 mL | Freq: Once | RESPIRATORY_TRACT | Status: AC
  Administered 2020-11-03: 3 mL via RESPIRATORY_TRACT
  Filled 2020-11-03: qty 3

## 2020-11-03 MED ORDER — OXYCODONE-ACETAMINOPHEN 5-325 MG PO TABS
1.0000 | ORAL_TABLET | ORAL | 0 refills | Status: DC | PRN
Start: 2020-11-03 — End: 2022-09-16

## 2020-11-03 NOTE — ED Provider Notes (Signed)
[facility] Emergency Department Provider Note   ____________________________________________   First MD Initiated Contact with Patient 11/03/20 0050     (approximate)  I have reviewed the triage vital signs and the nursing notes.   HISTORY  Chief Complaint Shortness of Breath    HPI Chelsea Ramsey is a 54 y.o. female brought to the ED via EMS from home with a chief complaint of right rib pain.  Patient has a history of COPD, on 5 L continuous oxygenation who fell out of her bed while sleeping 3 days ago and struck her right ribs.  Reports increasing pain, worsened with deep breathing, now dry cough.  Also feels like she is having a flareup of her COPD.  2 episodes of vomiting over the past 3 days.  Denies fever, chills, abdominal pain, dysuria or diarrhea.  No anticoagulant use.     Past Medical History:  Diagnosis Date  . Acute drug-induced gout of left foot 03/01/2018   Last Assessment & Plan:  Likely at least partially brought on by diuresis.  Needs to continue to diurese  Will push hydration Stop allopurinol given initiation during acute flare may worsen this, re-broach this when asymptomatic Avoid nsaids given stomach pain Trial colchicine Add acetaminophen Ice, elevate, rest  . Allergy    seasonal  . Anxiety   . Arthritis    Right Knee  . Asthma   . CHF (congestive heart failure) ([place])   . COPD (chronic obstructive pulmonary disease) ([place])   . Coronary artery disease    Leaky heart valve  . Dysrhythmia   . Fibromyalgia   . GERD (gastroesophageal reflux disease)   . Hypertension   . Pneumonia   . PUD (peptic ulcer disease)   . Pulmonary HTN ([place])   . Rheumatic fever/heart disease   . Sleep apnea     Patient Active Problem List   Diagnosis Date Noted  . Non compliance w medication regimen 01/15/2020  . Pain medication agreement broken 01/15/2020  . History of illicit drug use 09/32/6712  . Misuse of prescription only drugs  01/15/2020  . History of gastric ulcer 01/08/2020  . Stomach irritation 01/08/2020  . Esophageal dysphagia 01/08/2020  . Class 3 severe obesity due to excess calories with serious comorbidity in adult ([place]) 01/08/2020  . Pharmacologic therapy 12/14/2019  . Prediabetes 09/27/2019  . Numbness 08/03/2019  . Headache disorder 08/03/2019  . Elevated uric acid in blood 02/08/2019  . Hyperglycemia 02/08/2019  . Secondary osteoarthritis of multiple sites 02/08/2019  . Osteoarthritis of knees (Bilateral) (R>L) 02/08/2019  . Chronic gout of multiple sites, unspecified cause 02/08/2019  . Chronic musculoskeletal pain (Fourth Area of Pain) 02/08/2019  . Epidural lipomatosis 02/08/2019  . 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/08/2019  . History of cocaine use 01/18/2019  . Chronic gout involving toe of foot, unspecified cause (Left) 01/18/2019  . Chronic hip pain (Left) 01/18/2019  . Osteoarthritis of sacroiliac joints (Bilateral) 01/18/2019  . Other specified dorsopathies, sacral and sacrococcygeal region 01/18/2019  . Lumbar facet arthropathy (Bilateral) 01/05/2019  . Melena 01/03/2019  . Acute esophagogastric ulcer 01/03/2019  . Spondylosis without myelopathy or radiculopathy, lumbar region 12/27/2018  . Lumbar facet syndrome (Bilateral) 12/27/2018  . DDD (degenerative disc disease), lumbar 12/27/2018  . Chronic low back pain (Primary Area of Pain) (Bilateral) (L>R) w/o sciatica 12/27/2018  . Abnormal MRI, lumbar spine (01/05/2018) 12/27/2018  . Elevated sed rate 12/19/2018  . Elevated C-reactive protein 12/19/2018  . Diplopia 12/14/2018  .  Dizziness 12/14/2018  . Headache, chronic daily 12/14/2018  . Numbness and tingling of both feet 12/14/2018  . CHF (congestive heart failure) ([place]) 12/04/2018  . Class 3 severe obesity with body mass index (BMI) of 45.0 to 49.9 in adult ([place]) 11/14/2018  . Chronic anticoagulation (XARELTO) 11/14/2018  . Vitamin D deficiency 11/14/2018  . Chronic knee pain  after total replacement (Fifth Area of Pain) (Right) 11/14/2018  . Chest pain with moderate risk of acute coronary syndrome 10/17/2018  . Chronic low back pain (Primary Area of Pain) (Bilateral) w/ sciatica (Left) 10/10/2018  . Chronic lower extremity pain (Secondary Area of Pain) (Left) 10/10/2018  . Sternal pain (Tertiary Area of Pain) 10/10/2018  . Fibromyalgia (Fourth Area of Pain) 10/10/2018  . Chronic knee pain (Right) 10/10/2018  . Chronic sacroiliac joint pain (Bilateral) (L>R) 10/10/2018  . Chronic pain syndrome 10/10/2018  . Long term current use of opiate analgesic 10/10/2018  . Screening for colon cancer 10/10/2018  . Disorder of skeletal system 10/10/2018  . Problems influencing health status 10/10/2018  . HTN (hypertension) 07/26/2018  . Acute CHF (congestive heart failure) ([place]) 07/17/2018  . S/P MVR (mitral valve replacement) 06/20/2018  . Shortness of breath 05/27/2018  . GIB (gastrointestinal bleeding) 05/22/2018  . A-fib ([place]) 04/22/2018  . Insomnia 12/25/2016  . Osteoarthritis of knee (Right) 09/04/2016  . Tobacco dependence 05/08/2015  . GAD (generalized anxiety disorder) 03/13/2015  . Moderate to severe pulmonary hypertension ([place]) 03/13/2015  . Rheumatic heart disease, unspecified 03/13/2015  . Chronic diastolic heart failure ([place]) 02/25/2015  . Depression 02/25/2015  . History of substance abuse ([place]) 02/25/2015    Past Surgical History:  Procedure Laterality Date  . ADENOIDECTOMY    . COLONOSCOPY WITH PROPOFOL N/A 11/06/2019   Procedure: COLONOSCOPY WITH PROPOFOL;  Surgeon: Virgel Manifold, MD;  Location: [facility] ENDOSCOPY;  Service: Endoscopy;  Laterality: N/A;  2 day prep   . CORONARY ARTERY BYPASS GRAFT     June 27 2018  . ESOPHAGOGASTRODUODENOSCOPY (EGD) WITH PROPOFOL N/A 05/25/2018   Procedure: ESOPHAGOGASTRODUODENOSCOPY (EGD) WITH PROPOFOL;  Surgeon: Lucilla Lame, MD;  Location[facility] ENDOSCOPY;  Service: Endoscopy;  Laterality: N/A;  .  ESOPHAGOGASTRODUODENOSCOPY (EGD) WITH PROPOFOL N/A 11/06/2019   Procedure: ESOPHAGOGASTRODUODENOSCOPY (EGD) WITH PROPOFOL;  Surgeon: Virgel Manifold, MD;  Location: [facility] ENDOSCOPY;  Service: Endoscopy;  Laterality: N/A;  . MITRAL VALVE REPLACEMENT    . MULTIPLE TOOTH EXTRACTIONS    . TONSILLECTOMY    . TOTAL KNEE ARTHROPLASTY Right 09/04/2016   Procedure: TOTAL KNEE ARTHROPLASTY; with lateral release;  Surgeon: Earlie Server, MD;  Location: [place];  Service: Orthopedics;  Laterality: Right;    Prior to Admission medications   Medication Sig Start Date End Date Taking? Authorizing Provider  albuterol (PROAIR HFA) 108 (90 Base) MCG/ACT inhaler Inhale 1-2 puffs into the lungs every 6 (six) hours as needed for wheezing or shortness of breath.    [provider]  allopurinol (ZYLOPRIM) 100 MG tablet Take 1 tablet (100 mg total) by mouth 2 (two) times daily. 03/05/20 09/01/20  Milinda Pointer, MD  budesonide (PULMICORT) 0.5 MG/2ML nebulizer solution Inhale into the lungs. 01/24/20 01/23/21  [provider]  butalbital-acetaminophen-caffeine (FIORICET, ESGIC) 50-325-40 MG tablet Take 1 tablet by mouth every 6 (six) hours as needed.     [provider]  Calcium Carbonate-Vit D-Min (GNP CALCIUM 1200) 1200-1000 MG-UNIT CHEW Chew 1,200 mg by mouth daily with breakfast. Take in combination with vitamin D and magnesium. 09/13/19 09/12/20  Milinda Pointer,  MD  Cholecalciferol (D 5000) 125 MCG (5000 UT) capsule Take by mouth daily.  11/14/18   [provider]  colchicine 0.6 MG tablet Take 1 tablet (0.6 mg total) by mouth daily. As directed 03/05/20 09/01/20  Milinda Pointer, MD  furosemide (LASIX) 40 MG tablet TAKE 2 TABLETS BY MOUTH IN THE MORNING AND 1 TABLET AT NIGHT 04/23/20   Darylene Price A, FNP  gabapentin (NEURONTIN) 600 MG tablet Take 0.5 tablets (300 mg total) by mouth 2 (two) times daily. Patient taking differently: Take 600 mg by mouth 3 (three) times daily.   12/06/18   Vaughan Basta, MD  ipratropium-albuterol (DUONEB) 0.5-2.5 (3) MG/3ML SOLN INHALE THE CONTENTS OF 1 VIAL(3MLS) VIA NEBULIZER 3 TIMES DAILY AS NEEDED 10/24/19   Trinna Post, PA-C  lactulose (CHRONULAC) 10 GM/15ML solution Take 45 mLs (30 g total) by mouth 2 (two) times daily as needed for mild constipation. 07/21/18   Epifanio Lesches, MD  LINZESS 290 MCG CAPS capsule TAKE 1 CAPSULE BY MOUTH ONCE DAILY BEFORE BREAKFAST. 03/07/20   Vonda Antigua B, MD  meclizine (ANTIVERT) 12.5 MG tablet TAKE 2 TABLETS (25 MG TOTAL) BY MOUTH 3 TIMES DAILY AS NEEDED FOR DIZZINESS 11/17/19   Trinna Post, PA-C  metolazone (ZAROXOLYN) 2.5 MG tablet Take 2.5 mg by mouth. Three times weekly    [provider]  metoprolol tartrate (LOPRESSOR) 100 MG tablet Take 1 tablet (100 mg total) by mouth 2 (two) times daily. 12/21/18   Alisa Graff, FNP  nitroGLYCERIN (NITROSTAT) 0.4 MG SL tablet Place 1 tablet (0.4 mg total) under the tongue every 5 (five) minutes as needed for chest pain. 11/05/18   Bettey Costa, MD  nortriptyline (PAMELOR) 10 MG capsule Take 20 mg by mouth at bedtime.     [provider]  omeprazole (PRILOSEC) 20 MG capsule TAKE 2 CAPSULES BY MOUTH TWICE A DAY BEFORE A MEAL 03/06/20   Carles Collet M, PA-C  ondansetron (ZOFRAN) 4 MG tablet Take 1 tablet (4 mg total) by mouth every 8 (eight) hours as needed for nausea or vomiting. 03/08/20   Trinna Post, PA-C  oxyCODONE-acetaminophen (PERCOCET/ROXICET) 5-325 MG tablet Take 1 tablet by mouth every 4 (four) hours as needed for severe pain. 11/03/20   Paulette Blanch, MD  OXYGEN Inhale 4 L/L into the lungs.    [provider]  potassium chloride (KLOR-CON) 10 MEQ tablet Take 4 tablets (40 mEq) by mouth twice daily. Take 2 additional tablets on days you take metolazone. 01/09/20   [provider]  potassium chloride SA (KLOR-CON) 20 MEQ tablet Take 2 tablets (40 mEq total) by mouth 2 (two) times daily. And  additional tablet when taking metolazone 10/03/19   Darylene Price A, FNP  predniSONE (DELTASONE) 20 MG tablet 3 tablets daily x 4 days 11/03/20   Paulette Blanch, MD  promethazine (PHENERGAN) 12.5 MG tablet Take 1 tablet (12.5 mg total) by mouth every 8 (eight) hours as needed for nausea or vomiting. 02/20/20   Bacigalupo, Dionne Bucy, MD  rivaroxaban (XARELTO) 20 MG TABS tablet Take 20 mg by mouth daily with supper.    [provider]  tiZANidine (ZANAFLEX) 4 MG capsule Take 4 mg by mouth at bedtime.    [provider]  zolpidem (AMBIEN) 5 MG tablet Take 5 mg by mouth at bedtime as needed for sleep.    [provider]    Allergies Amiodarone, Aspirin, Flexeril [cyclobenzaprine], Trazamine [trazodone & diet manage prod], Codeine, and  Tramadol  Family History  Problem Relation Age of Onset  . COPD Mother   . Cancer Mother        Bone  . Asthma Mother   . Rheum arthritis Mother   . Congestive Heart Failure Father   . Breast cancer Maternal Aunt     Social History Social History   Tobacco Use  . Smoking status: Current Every Day Smoker    Packs/day: 0.10    Types: Cigarettes  . Smokeless tobacco: Never Used  . Tobacco comment: 4/20 was not able to quit.  Still at 1-3 a day. She would like to wait to set a new quit date until we get back to class to have the extra in person support  Vaping Use  . Vaping Use: Never used  Substance Use Topics  . Alcohol use: Never    Alcohol/week: 3.0 standard drinks    Types: 3 Cans of beer per week    Comment: 16 oz per week  . Drug use: Not Currently    Comment: Previous use of cocaine and marijuana last use 07/31/16     Review of Systems  Constitutional: No fever/chills Eyes: No visual changes. ENT: No sore throat. Cardiovascular: Positive for chest pain. Respiratory: Positive for cough and shortness of breath. Gastrointestinal: No abdominal pain.  No nausea, no vomiting.  No diarrhea.  No constipation. Genitourinary:  Negative for dysuria. Musculoskeletal: Negative for back pain. Skin: Negative for rash. Neurological: Negative for headaches, focal weakness or numbness.   ____________________________________________   PHYSICAL EXAM:  VITAL SIGNS: ED Triage Vitals [11/02/20 1553]  Enc Vitals Group     BP (!) 114/98     Pulse Rate 89     Resp (!) 28     Temp 98.5 F (36.9 C)     Temp Source Oral     SpO2 95 %     Weight (!) 321 lb (145.6 kg)     Height 5\' 4"  (1.626 m)     Head Circumference      Peak Flow      Pain Score 10     Pain Loc      Pain Edu?      Excl. in [place]?     Constitutional: Alert and oriented.  Uncomfortable appearing and in mild acute distress. Eyes: Conjunctivae are normal. PERRL. EOMI. Head: Atraumatic. Nose: No congestion/rhinnorhea. Mouth/Throat: Mucous membranes are moist.   Neck: No stridor.   Cardiovascular: Normal rate, regular rhythm. Grossly normal heart sounds.  Good peripheral circulation. Respiratory: Increased respiratory effort.  No retractions. Lungs diminished bibasilarly.  Mild splinting.  No crepitus.  Point tender right lateral lower ribs Gastrointestinal: Obese.  Soft and nontender to light or deep palpation, especially in right upper quadrant. No distention. No abdominal bruits. No CVA tenderness. Musculoskeletal: No lower extremity tenderness nor edema.  No joint effusions. Neurologic:  Normal speech and language. No gross focal neurologic deficits are appreciated. No gait instability. Skin:  Skin is warm, dry and intact. No rash noted. Psychiatric: Mood and affect are normal. Speech and behavior are normal.  ____________________________________________   LABS (all labs ordered are listed, but only abnormal results are displayed)  Labs Reviewed  BASIC METABOLIC PANEL - Abnormal; Notable for the following components:      Result Value   Chloride 97 (*)    Glucose, Bld 118 (*)    Calcium 8.7 (*)    All other components within normal limits    CBC - Abnormal;  Notable for the following components:   Hemoglobin 9.8 (*)    HCT 34.3 (*)    MCH 23.1 (*)    MCHC 28.6 (*)    RDW 17.2 (*)    All other components within normal limits  TROPONIN I (HIGH SENSITIVITY)  TROPONIN I (HIGH SENSITIVITY)   ____________________________________________  EKG  ED ECG REPORT I, Malakie Balis J, the attending physician, personally viewed and interpreted this ECG.   Date: 11/03/2020  EKG Time: 1546  Rate: 93  Rhythm: normal EKG, normal sinus rhythm  Axis: Normal  Intervals:none  ST&T Change: Nonspecific  ____________________________________________  RADIOLOGY I, Isaiahs Chancy J, personally viewed and evaluated these images (plain radiographs) as part of my medical decision making, as well as reviewing the written report by the radiologist.  ED MD interpretation: Mild CHF, no rib fractures, no pneumothorax  Official radiology report(s): DG Ribs Unilateral W/Chest Right  Result Date: 11/02/2020 CLINICAL DATA:  Recent fall with right-sided rib pain, initial encounter EXAM: RIGHT RIBS AND CHEST - 3+ VIEW COMPARISON:  12/01/2018 FINDINGS: Cardiac shadow is enlarged. Postsurgical changes are again seen. Increased vascular congestion is noted with mild edema. No acute rib fracture is noted. No pneumothorax is seen IMPRESSION: Changes of mild CHF. No findings to suggest acute rib fracture noted. Electronically Signed   By: Inez Catalina M.D.   On: 11/02/2020 17:05   CT Chest Wo Contrast  Result Date: 11/03/2020 CLINICAL DATA:  Difficulty breathing. EXAM: CT CHEST WITHOUT CONTRAST TECHNIQUE: Multidetector CT imaging of the chest was performed following the standard protocol without IV contrast. COMPARISON:  November 04, 2018 FINDINGS: Cardiovascular: The heart is enlarged. There appear to be coronary artery calcifications. There are atherosclerotic changes of the thoracic aorta without evidence for an aneurysm. The main pulmonary artery is mildly dilated.  Mediastinum/Nodes: -- No mediastinal lymphadenopathy. -- No hilar lymphadenopathy. -- No axillary lymphadenopathy. -- No supraclavicular lymphadenopathy. -- Normal thyroid gland where visualized. -  Unremarkable esophagus. Lungs/Pleura: Again noted are chronic bilateral reticulonodular airspace opacity. Emphysematous changes are again noted. There is no pneumothorax. There is a small right-sided pleural effusion. There is no focal infiltrate. Stable small pulmonary nodules are noted in the left upper lobe. Upper Abdomen: There is severe hepatic steatosis. Musculoskeletal: No chest wall abnormality. No bony spinal canal stenosis. IMPRESSION: 1. Small right-sided pleural effusion. 2. Cardiomegaly. 3. Chronic lung changes as described above. 4. Severe hepatic steatosis. Aortic Atherosclerosis (ICD10-I70.0) and Emphysema (ICD10-J43.9). Electronically Signed   By: Constance Holster M.D.   On: 11/03/2020 01:32    ____________________________________________   PROCEDURES  Procedure(s) performed (including Critical Care):  .1-3 Lead EKG Interpretation Performed by: Paulette Blanch, MD Authorized by: Paulette Blanch, MD     Interpretation: normal     ECG rate:  90   ECG rate assessment: normal     Rhythm: sinus rhythm     Ectopy: none     Conduction: normal   Comments:     Patient placed on cardiac monitor to evaluate for arrhythmias     ____________________________________________   INITIAL IMPRESSION / ASSESSMENT AND PLAN / ED COURSE  As part of my medical decision making, I reviewed the following data within the [facility] notes reviewed and incorporated, Labs reviewed, EKG interpreted, Old chart reviewed (office visits for cardiology and neurology), Radiograph reviewed and Notes from prior ED visits     54 year old female with COPD on continuous oxygenation presenting with right rib pain after fall from bed 3  days ago Differential includes, but is not limited to,  viral syndrome, bronchitis including COPD exacerbation, pneumonia, reactive airway disease including asthma, CHF including exacerbation with or without pulmonary/interstitial edema, pneumothorax, ACS, thoracic trauma, and pulmonary embolism.  Laboratory results unremarkable including 2 sets of negative troponins. Chest x-ray negative for rib fracture or pneumothorax.  Will obtain CT chest without contrast for further characterization of patient's injury.  Administer prednisone and DuoNeb for COPD exacerbation, Percocet for pain.  Will reassess.  Clinical Course as of Nov 04 719  Nancy Fetter Nov 03, 2020  5329 Updated patient on CT imaging results.  Wheezing and pain improved.  Will discharge home with a prescription for analgesia, prednisone burst and she will follow up closely with her PCP.  Strict return precautions given.  Patient verbalizes understanding and agrees with plan of care.   [JS]    Clinical Course User Index [JS] Paulette Blanch, MD     ____________________________________________   FINAL CLINICAL IMPRESSION(S) / ED DIAGNOSES  Final diagnoses:  Rib pain on right side  Contusion of right chest wall, initial encounter  COPD exacerbation [facility])     ED Discharge Orders         Ordered    predniSONE (DELTASONE) 20 MG tablet        11/03/20 0353    oxyCODONE-acetaminophen (PERCOCET/ROXICET) 5-325 MG tablet  Every 4 hours PRN        11/03/20 0353          *Please note:  Pamlea Finder was evaluated in Emergency Department on 11/03/2020 for the symptoms described in the history of present illness. She was evaluated in the context of the global COVID-19 pandemic, which necessitated consideration that the patient might be at risk for infection with the SARS-CoV-2 virus that causes COVID-19. Institutional protocols and algorithms that pertain to the evaluation of patients at risk for COVID-19 are in a state of rapid change based on information released by regulatory bodies  including the CDC and federal and state organizations. These policies and algorithms were followed during the patient's care in the ED.  Some ED evaluations and interventions may be delayed as a result of limited staffing during and the pandemic.*   Note:  This document was prepared using Dragon voice recognition software and may include unintentional dictation errors.   Paulette Blanch, MD 11/03/20 [phone]

## 2020-11-03 NOTE — ED Notes (Signed)
Patient reports that she fell out of bed landing on her right ribs.  States pain 10/10 and feels "something moving" when she takes a breath or moves.

## 2020-11-03 NOTE — ED Notes (Signed)
Patient transported to CT 

## 2020-11-03 NOTE — ED Notes (Signed)
Pt ambulated to toilet in room with steady gait. Mother called for ride home since pt is dc. Incentive spirometer provided. Teach back method used to demonstrate understanding. AO x4. Awaiting ride

## 2020-11-03 NOTE — Discharge Instructions (Addendum)
1.  Take prednisone daily until finished.  Start your next dose Monday morning. 2.  You may take pain medicines as needed. 3.  Use incentive spirometer as directed. 4.  Return to the ER for worsening symptoms, system vomiting, difficulty breathing or other concerns.

## 2020-11-03 NOTE — ED Notes (Signed)
Assumed care of pt at 0300. Pt resting with eyes closed, breathing regular and unlabored with symmetrical chest rise and fall. Side rails up x2, call bell within reach

## 2020-12-26 ENCOUNTER — Other Ambulatory Visit: Payer: Self-pay

## 2020-12-26 ENCOUNTER — Encounter: Payer: Self-pay | Admitting: Family

## 2020-12-26 ENCOUNTER — Ambulatory Visit: Payer: Medicaid Other | Attending: Family | Admitting: Family

## 2020-12-26 VITALS — BP 151/77 | HR 78 | Resp 20 | Ht 64.0 in | Wt 310.4 lb

## 2020-12-26 DIAGNOSIS — E119 Type 2 diabetes mellitus without complications: Secondary | ICD-10-CM | POA: Insufficient documentation

## 2020-12-26 DIAGNOSIS — G4733 Obstructive sleep apnea (adult) (pediatric): Secondary | ICD-10-CM | POA: Insufficient documentation

## 2020-12-26 DIAGNOSIS — I10 Essential (primary) hypertension: Secondary | ICD-10-CM

## 2020-12-26 DIAGNOSIS — I110 Hypertensive heart disease with heart failure: Secondary | ICD-10-CM | POA: Diagnosis not present

## 2020-12-26 DIAGNOSIS — I480 Paroxysmal atrial fibrillation: Secondary | ICD-10-CM

## 2020-12-26 DIAGNOSIS — I5032 Chronic diastolic (congestive) heart failure: Secondary | ICD-10-CM | POA: Insufficient documentation

## 2020-12-26 DIAGNOSIS — Z7984 Long term (current) use of oral hypoglycemic drugs: Secondary | ICD-10-CM | POA: Insufficient documentation

## 2020-12-26 DIAGNOSIS — Z888 Allergy status to other drugs, medicaments and biological substances status: Secondary | ICD-10-CM | POA: Diagnosis not present

## 2020-12-26 DIAGNOSIS — J449 Chronic obstructive pulmonary disease, unspecified: Secondary | ICD-10-CM | POA: Insufficient documentation

## 2020-12-26 DIAGNOSIS — I2510 Atherosclerotic heart disease of native coronary artery without angina pectoris: Secondary | ICD-10-CM | POA: Insufficient documentation

## 2020-12-26 DIAGNOSIS — Z8711 Personal history of peptic ulcer disease: Secondary | ICD-10-CM | POA: Insufficient documentation

## 2020-12-26 DIAGNOSIS — I4891 Unspecified atrial fibrillation: Secondary | ICD-10-CM | POA: Diagnosis not present

## 2020-12-26 DIAGNOSIS — Z886 Allergy status to analgesic agent status: Secondary | ICD-10-CM | POA: Insufficient documentation

## 2020-12-26 DIAGNOSIS — Z7901 Long term (current) use of anticoagulants: Secondary | ICD-10-CM | POA: Insufficient documentation

## 2020-12-26 DIAGNOSIS — Z79899 Other long term (current) drug therapy: Secondary | ICD-10-CM | POA: Insufficient documentation

## 2020-12-26 DIAGNOSIS — Z885 Allergy status to narcotic agent status: Secondary | ICD-10-CM | POA: Insufficient documentation

## 2020-12-26 DIAGNOSIS — R7303 Prediabetes: Secondary | ICD-10-CM

## 2020-12-26 DIAGNOSIS — I272 Pulmonary hypertension, unspecified: Secondary | ICD-10-CM | POA: Diagnosis not present

## 2020-12-26 DIAGNOSIS — K219 Gastro-esophageal reflux disease without esophagitis: Secondary | ICD-10-CM | POA: Diagnosis not present

## 2020-12-26 DIAGNOSIS — Z951 Presence of aortocoronary bypass graft: Secondary | ICD-10-CM | POA: Diagnosis not present

## 2020-12-26 DIAGNOSIS — F17210 Nicotine dependence, cigarettes, uncomplicated: Secondary | ICD-10-CM | POA: Diagnosis not present

## 2020-12-26 DIAGNOSIS — Z952 Presence of prosthetic heart valve: Secondary | ICD-10-CM | POA: Insufficient documentation

## 2020-12-26 DIAGNOSIS — Z9981 Dependence on supplemental oxygen: Secondary | ICD-10-CM | POA: Diagnosis not present

## 2020-12-26 DIAGNOSIS — Z96651 Presence of right artificial knee joint: Secondary | ICD-10-CM | POA: Insufficient documentation

## 2020-12-26 DIAGNOSIS — Z7951 Long term (current) use of inhaled steroids: Secondary | ICD-10-CM | POA: Diagnosis not present

## 2020-12-26 LAB — GLUCOSE, CAPILLARY: Glucose-Capillary: 141 mg/dL — ABNORMAL HIGH (ref 70–99)

## 2020-12-26 NOTE — Patient Instructions (Signed)
Continue weighing daily and call for an overnight weight gain of > 2 pounds or a weekly weight gain of >5 pounds. 

## 2020-12-26 NOTE — Progress Notes (Signed)
Patient ID: Chelsea Ramsey, female    DOB: 10/23/1966, 55 y.o.   MRN: [id number]   Ms Chelsea Ramsey is a 55 y/o female with a history of CAD (leaky valve), HTN, pulmonary HTN, COPD, GERD, obstructive sleep apnea, previous tobacco use and chronic heart failure.   Echo report from 01/30/20 reviewed and showed an EF of 55% along with mild MR/ moderate TR and moderate LAE. Echo reviewed from 09/06/19 and shows an EF of 50% along with a severely enlarged left atria. Echo report from 11/05/18 reviewed and showed an EF of 50-55% along with mild/moderate AR, mild MR,moderate TR and prosthetic mitral valve. Echo report from 08/11/18 reviewed and showed an EF of 40-45% which has declined from previous echo. + moderate TR and mild/moderately elevated PA pressure.  Echo report from 07/20/18 reviewed and showed an EF of 50-55% along with mild MR and an elevated PA pressure of 50 mm Hg.   Was in the ED 11/03/20 due to right rib pain and shortness of breath. Treated and released.    She presents today for a follow-up visit with a chief complaint of moderate shortness of breath with moderate exertion. She describes this as chronic in nature having been present for several years. She has associated fatigue, abdominal distention, dizziness, left thigh numbness and difficulty sleeping along with this. She denies any palpitations, pedal edema, chest pain, wheezing, cough or weight gain.   Admits that hearing the news about covid makes her very anxious and it makes her not leave the house much.   Past Medical History:  Diagnosis Date  . Acute drug-induced gout of left foot 03/01/2018   Last Assessment & Plan:  Likely at least partially brought on by diuresis.  Needs to continue to diurese  Will push hydration Stop allopurinol given initiation during acute flare may worsen this, re-broach this when asymptomatic Avoid nsaids given stomach pain Trial colchicine Add acetaminophen Ice, elevate, rest  . Allergy    seasonal  .  Anxiety   . Arthritis    Right Knee  . Asthma   . CHF (congestive heart failure) ([place])   . COPD (chronic obstructive pulmonary disease) ([place])   . Coronary artery disease    Leaky heart valve  . Dysrhythmia   . Fibromyalgia   . GERD (gastroesophageal reflux disease)   . Hypertension   . Pneumonia   . PUD (peptic ulcer disease)   . Pulmonary HTN ([place])   . Rheumatic fever/heart disease   . Sleep apnea    Past Surgical History:  Procedure Laterality Date  . ADENOIDECTOMY    . COLONOSCOPY WITH PROPOFOL N/A 11/06/2019   Procedure: COLONOSCOPY WITH PROPOFOL;  Surgeon: Virgel Manifold, MD;  Location: [facility] ENDOSCOPY;  Service: Endoscopy;  Laterality: N/A;  2 day prep   . CORONARY ARTERY BYPASS GRAFT     June 27 2018  . ESOPHAGOGASTRODUODENOSCOPY (EGD) WITH PROPOFOL N/A 05/25/2018   Procedure: ESOPHAGOGASTRODUODENOSCOPY (EGD) WITH PROPOFOL;  Surgeon: Lucilla Lame, MD;  Location: [facility] ENDOSCOPY;  Service: Endoscopy;  Laterality: N/A;  . ESOPHAGOGASTRODUODENOSCOPY (EGD) WITH PROPOFOL N/A 11/06/2019   Procedure: ESOPHAGOGASTRODUODENOSCOPY (EGD) WITH PROPOFOL;  Surgeon: Virgel Manifold, MD;  Location: [facility] ENDOSCOPY;  Service: Endoscopy;  Laterality: N/A;  . MITRAL VALVE REPLACEMENT    . MULTIPLE TOOTH EXTRACTIONS    . TONSILLECTOMY    . TOTAL KNEE ARTHROPLASTY Right 09/04/2016   Procedure: TOTAL KNEE ARTHROPLASTY; with lateral release;  Surgeon: Earlie Server, MD;  Location: [place];  Service:  Orthopedics;  Laterality: Right;   Family History  Problem Relation Age of Onset  . COPD Mother   . Cancer Mother        Bone  . Asthma Mother   . Rheum arthritis Mother   . Congestive Heart Failure Father   . Breast cancer Maternal Aunt    Social History   Tobacco Use  . Smoking status: Current Every Day Smoker    Packs/day: 0.10    Types: Cigarettes  . Smokeless tobacco: Never Used  . Tobacco comment: 4/20 was not able to quit.  Still at 1-3 a day. She would like to wait to set a  new quit date until we get back to class to have the extra in person support  Substance Use Topics  . Alcohol use: Never    Alcohol/week: 3.0 standard drinks    Types: 3 Cans of beer per week    Comment: 16 oz per week   Allergies  Allergen Reactions  . Amiodarone Nausea And Vomiting  . Aspirin Swelling  . Flexeril [Cyclobenzaprine] Swelling  . Trazamine [Trazodone & Diet Manage Prod] Nausea And Vomiting  . Codeine Rash  . Tramadol Rash   Prior to Admission medications   Medication Sig Start Date End Date Taking? Authorizing Provider  albuterol (VENTOLIN HFA) 108 (90 Base) MCG/ACT inhaler Inhale 1-2 puffs into the lungs every 6 (six) hours as needed for wheezing or shortness of breath.   Yes [provider]  allopurinol (ZYLOPRIM) 100 MG tablet Take 1 tablet (100 mg total) by mouth 2 (two) times daily. 03/05/20 09/01/20 Yes Milinda Pointer, MD  budesonide (PULMICORT) 0.5 MG/2ML nebulizer solution Inhale into the lungs. 01/24/20 01/23/21 Yes [provider]  butalbital-acetaminophen-caffeine (FIORICET, ESGIC) 50-325-40 MG tablet Take 1 tablet by mouth every 6 (six) hours as needed.    Yes [provider]  Calcium Carbonate-Vit D-Min (GNP CALCIUM 1200) 1200-1000 MG-UNIT CHEW Chew 1,200 mg by mouth daily with breakfast. Take in combination with vitamin D and magnesium. 09/13/19 09/12/20 Yes Milinda Pointer, MD  Cholecalciferol (D 5000) 125 MCG (5000 UT) capsule Take by mouth daily.  11/14/18  Yes [provider]  colchicine 0.6 MG tablet Take 1 tablet (0.6 mg total) by mouth daily. As directed 03/05/20 09/01/20 Yes Milinda Pointer, MD  empagliflozin (JARDIANCE) 10 MG TABS tablet Take 10 mg by mouth daily.   Yes [provider]  Fluticasone-Umeclidin-Vilant (TRELEGY ELLIPTA) 100-62.5-25 MCG/INH AEPB Inhale 1 puff into the lungs daily.   Yes [provider]  furosemide (LASIX) 40 MG tablet TAKE 2 TABLETS BY MOUTH IN THE MORNING AND 1 TABLET  AT NIGHT 04/23/20  Yes Marrisa Kimber A, FNP  gabapentin (NEURONTIN) 600 MG tablet Take 0.5 tablets (300 mg total) by mouth 2 (two) times daily. Patient taking differently: Take 600 mg by mouth 3 (three) times daily. 12/06/18  Yes Vaughan Basta, MD  ipratropium-albuterol (DUONEB) 0.5-2.5 (3) MG/3ML SOLN INHALE THE CONTENTS OF 1 VIAL(3MLS) VIA NEBULIZER 3 TIMES DAILY AS NEEDED 10/24/19  Yes Carles Collet M, PA-C  lactulose (CHRONULAC) 10 GM/15ML solution Take 45 mLs (30 g total) by mouth 2 (two) times daily as needed for mild constipation. 07/21/18  Yes Epifanio Lesches, MD  LINZESS 290 MCG CAPS capsule TAKE 1 CAPSULE BY MOUTH ONCE DAILY BEFORE BREAKFAST. 03/07/20  Yes Virgel Manifold, MD  meclizine (ANTIVERT) 12.5 MG tablet TAKE 2 TABLETS (25 MG TOTAL) BY MOUTH 3 TIMES DAILY AS NEEDED FOR DIZZINESS 11/17/19  Yes Trinna Post,  PA-C  metolazone (ZAROXOLYN) 2.5 MG tablet Take 2.5 mg by mouth. Three times weekly   Yes [provider]  metoprolol tartrate (LOPRESSOR) 100 MG tablet Take 1 tablet (100 mg total) by mouth 2 (two) times daily. 12/21/18  Yes Evanell Redlich, Otila Kluver A, FNP  nitroGLYCERIN (NITROSTAT) 0.4 MG SL tablet Place 1 tablet (0.4 mg total) under the tongue every 5 (five) minutes as needed for chest pain. 11/05/18  Yes Mody, Ulice Bold, MD  nortriptyline (PAMELOR) 10 MG capsule Take 20 mg by mouth at bedtime.    Yes [provider]  omeprazole (PRILOSEC) 20 MG capsule TAKE 2 CAPSULES BY MOUTH TWICE A DAY BEFORE A MEAL 03/06/20  Yes Pollak, Adriana M, PA-C  ondansetron (ZOFRAN) 4 MG tablet Take 1 tablet (4 mg total) by mouth every 8 (eight) hours as needed for nausea or vomiting. 03/08/20  Yes Trinna Post, PA-C  oxyCODONE-acetaminophen (PERCOCET/ROXICET) 5-325 MG tablet Take 1 tablet by mouth every 4 (four) hours as needed for severe pain. 11/03/20  Yes Paulette Blanch, MD  OXYGEN Inhale 4 L/L into the lungs.   Yes [provider]  potassium chloride (KLOR-CON) 10  MEQ tablet Take 4 tablets (40 mEq) by mouth twice daily. Take 2 additional tablets on days you take metolazone. 01/09/20  Yes [provider]  potassium chloride SA (KLOR-CON) 20 MEQ tablet Take 2 tablets (40 mEq total) by mouth 2 (two) times daily. And additional tablet when taking metolazone 10/03/19  Yes Cola Gane, Aura Fey, FNP  promethazine (PHENERGAN) 12.5 MG tablet Take 1 tablet (12.5 mg total) by mouth every 8 (eight) hours as needed for nausea or vomiting. 02/20/20  Yes Bacigalupo, Dionne Bucy, MD  rivaroxaban (XARELTO) 20 MG TABS tablet Take 20 mg by mouth daily with supper.   Yes [provider]  tiZANidine (ZANAFLEX) 4 MG capsule Take 4 mg by mouth at bedtime.   Yes [provider]  zolpidem (AMBIEN) 5 MG tablet Take 5 mg by mouth at bedtime as needed for sleep.   Yes [provider]     Review of Systems  Constitutional: Positive for fatigue (minimal). Negative for appetite change.  HENT: Negative for congestion, postnasal drip and sore throat.   Eyes: Positive for visual disturbance (blurry vision at times).  Respiratory: Positive for shortness of breath (with moderate exertion). Negative for cough and wheezing.   Cardiovascular: Negative for chest pain, palpitations and leg swelling.  Gastrointestinal: Positive for abdominal distention (comes and goes). Negative for abdominal pain and nausea.  Endocrine: Negative.   Genitourinary: Negative.   Musculoskeletal: Positive for arthralgias (under left breast and up towards sternum). Negative for back pain and neck pain.  Skin: Negative.   Allergic/Immunologic: Negative.   Neurological: Positive for dizziness ("vertigo") and numbness (outer left thigh). Negative for light-headedness.  Hematological: Negative for adenopathy. Does not bruise/bleed easily.  Psychiatric/Behavioral: Positive for sleep disturbance (oxygen at 4L at bedtime, 4 pillows). Negative for dysphoric mood. The patient is nervous/anxious.     Vitals:   12/26/20 1146  BP: (!) 151/77  Pulse: 78  Resp: 20  SpO2: 90%  Weight: (!) 310 lb 6 oz (140.8 kg)  Height: 5\' 4"  (1.626 m)   Wt Readings from Last 3 Encounters:  12/26/20 (!) 310 lb 6 oz (140.8 kg)  11/02/20 (!) 321 lb (145.6 kg)  03/08/20 (!) 306 lb 6.4 oz (139 kg)   Lab Results  Component Value Date   CREATININE 0.54 11/02/2020   CREATININE 0.87 01/31/2020  CREATININE 0.67 10/02/2019    Physical Exam Vitals and nursing note reviewed.  Constitutional:      Appearance: She is well-developed.  HENT:     Head: Normocephalic and atraumatic.  Neck:     Vascular: No JVD.  Cardiovascular:     Rate and Rhythm: Normal rate and regular rhythm.  Pulmonary:     Effort: Pulmonary effort is normal. No respiratory distress.     Breath sounds: No wheezing, rhonchi or rales.  Abdominal:     General: There is no distension.     Palpations: Abdomen is soft.  Musculoskeletal:     Cervical back: Normal range of motion and neck supple.     Right lower leg: No tenderness. No edema.     Left lower leg: No tenderness. No edema.  Skin:    General: Skin is warm and dry.  Neurological:     Mental Status: She is alert and oriented to person, place, and time.     Sensory: Sensation is intact.     Motor: Motor function is intact.  Psychiatric:        Mood and Affect: Mood is not anxious.        Behavior: Behavior normal.    Assessment & Plan:  1: Chronic heart failure with preserved ejection fraction with structural changes (LAE)- - NYHA class II - euvolemic today - weighing daily; reminded to call for an overnight weight gain of >2 pounds or a weekly weight gain of >5 pounds - not adding salt and has been reading food labels. Reviewed the importance of closely following a 2000mg  sodium diet  - encouraged her to limit her fluid intake to 64 ounces daily. - BNP 01/08/20 was 284.0 - had telemedicine visit with cardiology Margarito Courser) 08/28/20 - saw pulmonology Lanney Gins)  05/28/20 - reports receiving her flu vaccine for this season - reports receiving her 3 covid vaccines  2: HTN- - BP mildly elevated today but she admits that she's anxious because she saw that there's a new covid variant; encouraged her to limit how much news she watches - saw PCP Terrilee Croak) 03/08/20  - BMP 11/02/20 reviewed and showed sodium 139, potassium 3.5, creatinine 0.54 and GFR >60  3: Atrial fibrillation- - had mitral valve replaced July 2019 - cardioverted 06/30/18  4: DM- - A1c 03/08/20 was 6.7% - nonfasting glucose in clinic today was 141   Patient did not bring her medications nor a list. Each medication was verbally reviewed with the patient and she was encouraged to bring the bottles to every visit to confirm accuracy of list.  Return in 6 months or sooner for any questions/problems before then.

## 2021-05-28 ENCOUNTER — Other Ambulatory Visit: Payer: Self-pay | Admitting: Neurological Surgery

## 2021-05-28 DIAGNOSIS — G8929 Other chronic pain: Secondary | ICD-10-CM

## 2021-06-06 ENCOUNTER — Other Ambulatory Visit: Payer: Self-pay

## 2021-06-06 ENCOUNTER — Ambulatory Visit
Admission: RE | Admit: 2021-06-06 | Discharge: 2021-06-06 | Disposition: A | Discharge status: Home / Self Care | Payer: Medicaid Other | Source: Ambulatory Visit | Attending: Neurological Surgery | Admitting: Neurological Surgery

## 2021-06-06 DIAGNOSIS — G8929 Other chronic pain: Secondary | ICD-10-CM

## 2021-06-06 DIAGNOSIS — M5442 Lumbago with sciatica, left side: Secondary | ICD-10-CM

## 2021-06-19 ENCOUNTER — Ambulatory Visit: Payer: Medicaid Other | Attending: Family | Admitting: Family

## 2021-06-19 ENCOUNTER — Encounter: Payer: Self-pay | Admitting: Family

## 2021-06-19 ENCOUNTER — Other Ambulatory Visit: Payer: Self-pay

## 2021-06-19 VITALS — BP 109/61 | HR 70 | Resp 18 | Ht 64.0 in | Wt 301.5 lb

## 2021-06-19 DIAGNOSIS — E119 Type 2 diabetes mellitus without complications: Secondary | ICD-10-CM | POA: Insufficient documentation

## 2021-06-19 DIAGNOSIS — Z951 Presence of aortocoronary bypass graft: Secondary | ICD-10-CM | POA: Diagnosis not present

## 2021-06-19 DIAGNOSIS — I110 Hypertensive heart disease with heart failure: Secondary | ICD-10-CM | POA: Insufficient documentation

## 2021-06-19 DIAGNOSIS — R7303 Prediabetes: Secondary | ICD-10-CM

## 2021-06-19 DIAGNOSIS — Z888 Allergy status to other drugs, medicaments and biological substances status: Secondary | ICD-10-CM | POA: Insufficient documentation

## 2021-06-19 DIAGNOSIS — Z952 Presence of prosthetic heart valve: Secondary | ICD-10-CM | POA: Insufficient documentation

## 2021-06-19 DIAGNOSIS — Z720 Tobacco use: Secondary | ICD-10-CM

## 2021-06-19 DIAGNOSIS — I5032 Chronic diastolic (congestive) heart failure: Secondary | ICD-10-CM

## 2021-06-19 DIAGNOSIS — Z886 Allergy status to analgesic agent status: Secondary | ICD-10-CM | POA: Insufficient documentation

## 2021-06-19 DIAGNOSIS — R5383 Other fatigue: Secondary | ICD-10-CM | POA: Diagnosis not present

## 2021-06-19 DIAGNOSIS — I480 Paroxysmal atrial fibrillation: Secondary | ICD-10-CM

## 2021-06-19 DIAGNOSIS — I4891 Unspecified atrial fibrillation: Secondary | ICD-10-CM | POA: Insufficient documentation

## 2021-06-19 DIAGNOSIS — F17210 Nicotine dependence, cigarettes, uncomplicated: Secondary | ICD-10-CM | POA: Insufficient documentation

## 2021-06-19 DIAGNOSIS — Z885 Allergy status to narcotic agent status: Secondary | ICD-10-CM | POA: Diagnosis not present

## 2021-06-19 DIAGNOSIS — R42 Dizziness and giddiness: Secondary | ICD-10-CM | POA: Insufficient documentation

## 2021-06-19 DIAGNOSIS — I10 Essential (primary) hypertension: Secondary | ICD-10-CM

## 2021-06-19 MED ORDER — VITAMIN D 50 MCG (2000 UT) PO CAPS: 1.0000 | ORAL_CAPSULE | Freq: Every day | ORAL | 5 refills | Status: AC

## 2021-06-19 MED ORDER — NITROGLYCERIN 0.4 MG SL SUBL: 0.4000 mg | SUBLINGUAL_TABLET | SUBLINGUAL | 12 refills | Status: AC | PRN

## 2021-06-19 NOTE — Progress Notes (Signed)
Patient ID: Chelsea Ramsey, female    DOB: 1966-10-31, 55 y.o.   MRN: [id number]   Chelsea Ramsey is a 54 y/o female with a history of CAD (leaky valve), HTN, pulmonary HTN, COPD, GERD, obstructive sleep apnea, previous tobacco use and chronic heart failure.   Echo report from 01/30/20 reviewed and showed an EF of 55% along with mild MR/ moderate TR and moderate LAE. Echo reviewed from 09/06/19 and shows an EF of 50% along with a severely enlarged left atria. Echo report from 11/05/18 reviewed and showed an EF of 50-55% along with mild/moderate AR, mild MR,moderate TR and prosthetic mitral valve. Echo report from 08/11/18 reviewed and showed an EF of 40-45% which has declined from previous echo. + moderate TR and mild/moderately elevated PA pressure.  Echo report from 07/20/18 reviewed and showed an EF of 50-55% along with mild MR and an elevated PA pressure of 50 mm Hg.   Has not been admitted or been in the ED in the last 6 months.   She presents today for a follow-up visit with a chief complaint of moderate shortness of breath with moderate exertion. She describes this as chronic in nature having been present for several years. She has associated fatigue, intermittent abdominal distention, anxiety and dizziness along with this. She denies any difficulty sleeping, palpitations, pedal edema, chest pain, wheezing, cough or weight gain.   Past Medical History:  Diagnosis Date   Acute drug-induced gout of left foot 03/01/2018   Last Assessment & Plan:  Likely at least partially brought on by diuresis.  Needs to continue to diurese  Will push hydration Stop allopurinol given initiation during acute flare may worsen this, re-broach this when asymptomatic Avoid nsaids given stomach pain Trial colchicine Add acetaminophen Ice, elevate, rest   Allergy    seasonal   Anxiety    Arthritis    Right Knee   Asthma    CHF (congestive heart failure) (HCC)    COPD (chronic obstructive pulmonary disease) (HCC)     Coronary artery disease    Leaky heart valve   Dysrhythmia    Fibromyalgia    GERD (gastroesophageal reflux disease)    Hypertension    Pneumonia    PUD (peptic ulcer disease)    Pulmonary HTN (HCC)    Rheumatic fever/heart disease    Sleep apnea    Past Surgical History:  Procedure Laterality Date   ADENOIDECTOMY     COLONOSCOPY WITH PROPOFOL N/A 11/06/2019   Procedure: COLONOSCOPY WITH PROPOFOL;  Surgeon: Virgel Manifold, MD;  Location: [facility] ENDOSCOPY;  Service: Endoscopy;  Laterality: N/A;  2 day prep    CORONARY ARTERY BYPASS GRAFT     June 27 2018   ESOPHAGOGASTRODUODENOSCOPY (EGD) WITH PROPOFOL N/A 05/25/2018   Procedure: ESOPHAGOGASTRODUODENOSCOPY (EGD) WITH PROPOFOL;  Surgeon: Lucilla Lame, MD;  Location: [facility] ENDOSCOPY;  Service: Endoscopy;  Laterality: N/A;   ESOPHAGOGASTRODUODENOSCOPY (EGD) WITH PROPOFOL N/A 11/06/2019   Procedure: ESOPHAGOGASTRODUODENOSCOPY (EGD) WITH PROPOFOL;  Surgeon: Virgel Manifold, MD;  Location: [facility] ENDOSCOPY;  Service: Endoscopy;  Laterality: N/A;   MITRAL VALVE REPLACEMENT     MULTIPLE TOOTH EXTRACTIONS     TONSILLECTOMY     TOTAL KNEE ARTHROPLASTY Right 09/04/2016   Procedure: TOTAL KNEE ARTHROPLASTY; with lateral release;  Surgeon: Earlie Server, MD;  Location: [place];  Service: Orthopedics;  Laterality: Right;   Family History  Problem Relation Age of Onset   COPD Mother    Cancer Mother  Bone   Asthma Mother    Rheum arthritis Mother    Congestive Heart Failure Father    Breast cancer Maternal Aunt    Social History   Tobacco Use   Smoking status: Every Day    Packs/day: 0.10    Types: Cigarettes   Smokeless tobacco: Never   Tobacco comments:    4/20 was not able to quit.  Still at 1-3 a day. She would like to wait to set a new quit date until we get back to class to have the extra in person support  Substance Use Topics   Alcohol use: Never    Alcohol/week: 3.0 standard drinks    Types: 3 Cans of beer per  week    Comment: 16 oz per week   Allergies  Allergen Reactions   Amiodarone Nausea And Vomiting   Aspirin Swelling   Flexeril [Cyclobenzaprine] Swelling   Trazamine [Trazodone & Diet Manage Prod] Nausea And Vomiting   Codeine Rash   Tramadol Rash   Prior to Admission medications   Medication Sig Start Date End Date Taking? Authorizing Provider  albuterol (VENTOLIN HFA) 108 (90 Base) MCG/ACT inhaler Inhale 1-2 puffs into the lungs every 6 (six) hours as needed for wheezing or shortness of breath.   Yes [provider]  allopurinol (ZYLOPRIM) 100 MG tablet Take 1 tablet (100 mg total) by mouth 2 (two) times daily. 03/05/20  Yes Milinda Pointer, MD  butalbital-acetaminophen-caffeine (FIORICET, ESGIC) [phone] MG tablet Take 1 tablet by mouth every 6 (six) hours as needed.    Yes [provider]  Cholecalciferol (VITAMIN D) 50 MCG (2000 UT) CAPS Take 1 capsule (2,000 Units total) by mouth daily. 06/19/21  Yes Darylene Price A, FNP  colchicine 0.6 MG tablet Take 1 tablet (0.6 mg total) by mouth daily. As directed 03/05/20  Yes Milinda Pointer, MD  empagliflozin (JARDIANCE) 10 MG TABS tablet Take 10 mg by mouth daily.   Yes [provider]  Fluticasone-Umeclidin-Vilant (TRELEGY ELLIPTA) 100-62.5-25 MCG/INH AEPB Inhale 1 puff into the lungs daily.   Yes [provider]  furosemide (LASIX) 40 MG tablet TAKE 2 TABLETS BY MOUTH IN THE MORNING AND 1 TABLET AT NIGHT 04/23/20  Yes Terrin Imparato A, FNP  gabapentin (NEURONTIN) 600 MG tablet Take 0.5 tablets (300 mg total) by mouth 2 (two) times daily. Patient taking differently: Take 600 mg by mouth 3 (three) times daily. 12/06/18  Yes Vaughan Basta, MD  ipratropium-albuterol (DUONEB) 0.5-2.5 (3) MG/3ML SOLN INHALE THE CONTENTS OF 1 VIAL(3MLS) VIA NEBULIZER 3 TIMES DAILY AS NEEDED 10/24/19  Yes Pollak, Adriana M, PA-C  LINZESS 290 MCG CAPS capsule TAKE 1 CAPSULE BY MOUTH ONCE DAILY BEFORE BREAKFAST. 03/07/20  Yes  Virgel Manifold, MD  meclizine (ANTIVERT) 12.5 MG tablet TAKE 2 TABLETS (25 MG TOTAL) BY MOUTH 3 TIMES DAILY AS NEEDED FOR DIZZINESS 11/17/19  Yes Carles Collet M, PA-C  metolazone (ZAROXOLYN) 2.5 MG tablet Take 2.5 mg by mouth. Three times weekly   Yes [provider]  metoprolol tartrate (LOPRESSOR) 100 MG tablet Take 1 tablet (100 mg total) by mouth 2 (two) times daily. 12/21/18  Yes Emeka Lindner, Otila Kluver A, FNP  nortriptyline (PAMELOR) 10 MG capsule Take 20 mg by mouth at bedtime.    Yes [provider]  omeprazole (PRILOSEC) 20 MG capsule TAKE 2 CAPSULES BY MOUTH TWICE A DAY BEFORE A MEAL 03/06/20  Yes Carles Collet M, PA-C  oxyCODONE-acetaminophen (PERCOCET/ROXICET) 5-325 MG tablet Take 1 tablet by mouth every  4 (four) hours as needed for severe pain. 11/03/20  Yes Paulette Blanch, MD  OXYGEN Inhale 4 L/L into the lungs.   Yes [provider]  potassium chloride (KLOR-CON) 10 MEQ tablet Take 4 tablets (40 mEq) by mouth twice daily. Take 2 additional tablets on days you take metolazone. 01/09/20  Yes [provider]  promethazine (PHENERGAN) 12.5 MG tablet Take 1 tablet (12.5 mg total) by mouth every 8 (eight) hours as needed for nausea or vomiting. 02/20/20  Yes Bacigalupo, Dionne Bucy, MD  rivaroxaban (XARELTO) 20 MG TABS tablet Take 20 mg by mouth daily with supper.   Yes [provider]  Calcium Carbonate-Vit D-Min (GNP CALCIUM 1200) 1200-1000 MG-UNIT CHEW Chew 1,200 mg by mouth daily with breakfast. Take in combination with vitamin D and magnesium. Patient not taking: Reported on 06/19/2021 09/13/19 09/12/20  Milinda Pointer, MD  Cholecalciferol (D 5000) 125 MCG (5000 UT) capsule Take by mouth daily.  Patient not taking: Reported on 06/19/2021 11/14/18   [provider]  lactulose (CHRONULAC) 10 GM/15ML solution Take 45 mLs (30 g total) by mouth 2 (two) times daily as needed for mild constipation. Patient not taking: Reported on 06/19/2021 07/21/18    Epifanio Lesches, MD  nitroGLYCERIN (NITROSTAT) 0.4 MG SL tablet Place 1 tablet (0.4 mg total) under the tongue every 5 (five) minutes as needed for chest pain. 06/19/21   Alisa Graff, FNP  potassium chloride SA (KLOR-CON) 20 MEQ tablet Take 2 tablets (40 mEq total) by mouth 2 (two) times daily. And additional tablet when taking metolazone Patient not taking: Reported on 06/19/2021 10/03/19   Alisa Graff, FNP  zolpidem (AMBIEN) 5 MG tablet Take 5 mg by mouth at bedtime as needed for sleep. Patient not taking: Reported on 06/19/2021    [provider]    Review of Systems  Constitutional:  Positive for fatigue (minimal). Negative for appetite change.  HENT:  Positive for nosebleeds (last week). Negative for congestion, postnasal drip and sore throat.   Eyes:  Positive for visual disturbance (blurry vision at times).  Respiratory:  Positive for shortness of breath (with moderate exertion). Negative for cough and wheezing.   Cardiovascular:  Negative for chest pain, palpitations and leg swelling.  Gastrointestinal:  Positive for abdominal distention (comes and goes). Negative for abdominal pain and nausea.  Endocrine: Negative.   Genitourinary: Negative.   Musculoskeletal:  Positive for arthralgias (under left breast and up towards sternum). Negative for back pain and neck pain.  Skin: Negative.   Allergic/Immunologic: Negative.   Neurological:  Positive for dizziness ("vertigo") and numbness (outer left thigh). Negative for light-headedness.  Hematological:  Negative for adenopathy. Does not bruise/bleed easily.  Psychiatric/Behavioral:  Negative for dysphoric mood and sleep disturbance (oxygen at 4L at bedtime, 4 pillows). The patient is nervous/anxious.    Vitals:   06/19/21 1253  BP: 109/61  Pulse: 70  Resp: 18  SpO2: 96%  Weight: (!) 301 lb 8 oz (136.8 kg)  Height: 5\' 4"  (1.626 m)   Wt Readings from Last 3 Encounters:  06/19/21 (!) 301 lb 8 oz (136.8 kg)   12/26/20 (!) 310 lb 6 oz (140.8 kg)  11/02/20 (!) 321 lb (145.6 kg)   Lab Results  Component Value Date   CREATININE 0.54 11/02/2020   CREATININE 0.87 01/31/2020   CREATININE 0.67 10/02/2019   Physical Exam Vitals reviewed.  Constitutional:      Appearance: She is well-developed.  HENT:     Head: Normocephalic  and atraumatic.  Neck:     Vascular: No JVD.  Cardiovascular:     Rate and Rhythm: Normal rate and regular rhythm.  Pulmonary:     Effort: Pulmonary effort is normal. No respiratory distress.     Breath sounds: No wheezing, rhonchi or rales.  Abdominal:     General: There is no distension.     Palpations: Abdomen is soft.  Musculoskeletal:     Cervical back: Normal range of motion and neck supple.     Right lower leg: No tenderness. No edema.     Left lower leg: No tenderness. No edema.  Skin:    General: Skin is warm and dry.  Neurological:     Mental Status: She is alert and oriented to person, place, and time.     Sensory: Sensation is intact.     Motor: Motor function is intact.  Psychiatric:        Mood and Affect: Mood is not anxious.        Behavior: Behavior normal.   Assessment & Plan:  1: Chronic heart failure with preserved ejection fraction with structural changes (LAE)- - NYHA class III - euvolemic today - weighing daily; reminded to call for an overnight weight gain of >2 pounds or a weekly weight gain of >5 pounds - weight down 9 pounds from last visit here 6 months ago - not adding salt and has been reading food labels. Reviewed the importance of closely following a 2000mg  sodium diet  - BNP 01/08/20 was 284.0 - saw cardiology Margarito Courser) 04/24/21 - saw pulmonology Lanney Gins) 05/28/20  2: HTN- - BP looks good today - saw PCP Terrilee Croak) 03/08/20  - BMP 01/02/21 reviewed and showed sodium 140, potassium 3.2, creatinine 0.7 and GFR 106  3: Atrial fibrillation- - had mitral valve replaced July 2019 - cardioverted 06/30/18  4: DM- - A1c 03/08/20 was  6.7% - says the last time she checked her glucose it was "good"  5: Tobacco use- - says that she's smoking 6 cigarettes daily and isn't interested in stopping - complete cessation discussed for 3 minutes with her   Patient did not bring her medications nor a list. Each medication was verbally reviewed with the patient and she was encouraged to bring the bottles to every visit to confirm accuracy of list.  Return in 6 months or sooner for any questions/problems before then.

## 2021-06-19 NOTE — Patient Instructions (Signed)
Continue weighing daily and call for an overnight weight gain of > 2 pounds or a weekly weight gain of >5 pounds. 

## 2021-06-20 ENCOUNTER — Ambulatory Visit: Payer: Medicaid Other | Admitting: Family

## 2021-12-18 ENCOUNTER — Encounter: Payer: Self-pay | Admitting: Family

## 2021-12-18 ENCOUNTER — Telehealth: Payer: Self-pay | Admitting: Family

## 2021-12-18 ENCOUNTER — Ambulatory Visit (HOSPITAL_BASED_OUTPATIENT_CLINIC_OR_DEPARTMENT_OTHER): Payer: Medicaid Other | Admitting: Family

## 2021-12-18 ENCOUNTER — Other Ambulatory Visit: Payer: Self-pay | Admitting: Family

## 2021-12-18 ENCOUNTER — Other Ambulatory Visit: Payer: Self-pay

## 2021-12-18 ENCOUNTER — Other Ambulatory Visit
Admission: RE | Admit: 2021-12-18 | Discharge: 2021-12-18 | Disposition: A | Discharge status: Home / Self Care | Payer: Medicaid Other | Source: Ambulatory Visit | Attending: Family | Admitting: Family

## 2021-12-18 VITALS — BP 124/52 | HR 88 | Resp 18 | Ht 64.0 in | Wt 281.1 lb

## 2021-12-18 DIAGNOSIS — Z951 Presence of aortocoronary bypass graft: Secondary | ICD-10-CM | POA: Insufficient documentation

## 2021-12-18 DIAGNOSIS — I5032 Chronic diastolic (congestive) heart failure: Secondary | ICD-10-CM | POA: Insufficient documentation

## 2021-12-18 DIAGNOSIS — F17210 Nicotine dependence, cigarettes, uncomplicated: Secondary | ICD-10-CM | POA: Insufficient documentation

## 2021-12-18 DIAGNOSIS — I110 Hypertensive heart disease with heart failure: Secondary | ICD-10-CM | POA: Insufficient documentation

## 2021-12-18 DIAGNOSIS — G4733 Obstructive sleep apnea (adult) (pediatric): Secondary | ICD-10-CM | POA: Insufficient documentation

## 2021-12-18 DIAGNOSIS — J449 Chronic obstructive pulmonary disease, unspecified: Secondary | ICD-10-CM | POA: Insufficient documentation

## 2021-12-18 DIAGNOSIS — R7303 Prediabetes: Secondary | ICD-10-CM

## 2021-12-18 DIAGNOSIS — I10 Essential (primary) hypertension: Secondary | ICD-10-CM | POA: Diagnosis not present

## 2021-12-18 DIAGNOSIS — E119 Type 2 diabetes mellitus without complications: Secondary | ICD-10-CM | POA: Insufficient documentation

## 2021-12-18 DIAGNOSIS — Z720 Tobacco use: Secondary | ICD-10-CM

## 2021-12-18 DIAGNOSIS — I4891 Unspecified atrial fibrillation: Secondary | ICD-10-CM | POA: Insufficient documentation

## 2021-12-18 DIAGNOSIS — I2510 Atherosclerotic heart disease of native coronary artery without angina pectoris: Secondary | ICD-10-CM | POA: Insufficient documentation

## 2021-12-18 DIAGNOSIS — K219 Gastro-esophageal reflux disease without esophagitis: Secondary | ICD-10-CM | POA: Insufficient documentation

## 2021-12-18 DIAGNOSIS — I272 Pulmonary hypertension, unspecified: Secondary | ICD-10-CM | POA: Insufficient documentation

## 2021-12-18 DIAGNOSIS — I480 Paroxysmal atrial fibrillation: Secondary | ICD-10-CM

## 2021-12-18 LAB — BASIC METABOLIC PANEL
Anion gap: 11 (ref 5–15)
BUN: 11 mg/dL (ref 6–20)
CO2: 35 mmol/L — ABNORMAL HIGH (ref 22–32)
Calcium: 8.5 mg/dL — ABNORMAL LOW (ref 8.9–10.3)
Chloride: 90 mmol/L — ABNORMAL LOW (ref 98–111)
Creatinine, Ser: 0.75 mg/dL (ref 0.44–1.00)
GFR, Estimated: >60 mL/min (ref >60)
Glucose, Bld: 159 mg/dL — ABNORMAL HIGH (ref 70–99)
Potassium: 2.8 mmol/L — ABNORMAL LOW (ref 3.5–5.1)
Sodium: 136 mmol/L (ref 135–145)

## 2021-12-18 NOTE — Patient Instructions (Signed)
Continue weighing daily and call for an overnight weight gain of 3 pounds or more or a weekly weight gain of more than 5 pounds.  °

## 2021-12-18 NOTE — Progress Notes (Signed)
Patient ID: Chelsea Ramsey, female    DOB: 09-Apr-1966, 56 y.o.   MRN: [id number]   Chelsea Ramsey is a 56 y/o female with a history of CAD (leaky valve), HTN, pulmonary HTN, COPD, GERD, obstructive sleep apnea, previous tobacco use and chronic heart failure.   Echo report from 07/01/21 reviewed and showed an EF of 55%. Echo report from 01/30/20 reviewed and showed an EF of 55% along with mild MR/ moderate TR and moderate LAE. Echo reviewed from 09/06/19 and shows an EF of 50% along with a severely enlarged left atria. Echo report from 11/05/18 reviewed and showed an EF of 50-55% along with mild/moderate AR, mild MR,moderate TR and prosthetic mitral valve. Echo report from 08/11/18 reviewed and showed an EF of 40-45% which has declined from previous echo. + moderate TR and mild/moderately elevated PA pressure.  Echo report from 07/20/18 reviewed and showed an EF of 50-55% along with mild MR and an elevated PA pressure of 50 mm Hg.   Has not been admitted or been in the ED in the last 6 months.   She presents today for a follow-up visit with a chief complaint of minimal fatigue upon moderate exertion. She describes this as chronic in nature having been present for several years. She has associated cough, shortness of breath, nosebleeds, anxiety and chronic intermittent dizziness along with this. She denies any difficulty sleeping, abdominal distention, palpitations, pedal edema, chest pain, wheezing or weight gain.   She overall feels quite well. Currently drinking a slushy and she knows she shouldn't be drinking these because of the sugar in them.   Past Medical History:  Diagnosis Date   Acute drug-induced gout of left foot 03/01/2018   Last Assessment & Plan:  Likely at least partially brought on by diuresis.  Needs to continue to diurese  Will push hydration Stop allopurinol given initiation during acute flare may worsen this, re-broach this when asymptomatic Avoid nsaids given stomach pain Trial  colchicine Add acetaminophen Ice, elevate, rest   Allergy    seasonal   Anxiety    Arthritis    Right Knee   Asthma    CHF (congestive heart failure) (HCC)    COPD (chronic obstructive pulmonary disease) (HCC)    Coronary artery disease    Leaky heart valve   Dysrhythmia    Fibromyalgia    GERD (gastroesophageal reflux disease)    Hypertension    Pneumonia    PUD (peptic ulcer disease)    Pulmonary HTN (HCC)    Rheumatic fever/heart disease    Sleep apnea    Past Surgical History:  Procedure Laterality Date   ADENOIDECTOMY     COLONOSCOPY WITH PROPOFOL N/A 11/06/2019   Procedure: COLONOSCOPY WITH PROPOFOL;  Surgeon: Virgel Manifold, MD;  Location: [facility] ENDOSCOPY;  Service: Endoscopy;  Laterality: N/A;  2 day prep    CORONARY ARTERY BYPASS GRAFT     June 27 2018   ESOPHAGOGASTRODUODENOSCOPY (EGD) WITH PROPOFOL N/A 05/25/2018   Procedure: ESOPHAGOGASTRODUODENOSCOPY (EGD) WITH PROPOFOL;  Surgeon: Lucilla Lame, MD;  Location: [facility] ENDOSCOPY;  Service: Endoscopy;  Laterality: N/A;   ESOPHAGOGASTRODUODENOSCOPY (EGD) WITH PROPOFOL N/A 11/06/2019   Procedure: ESOPHAGOGASTRODUODENOSCOPY (EGD) WITH PROPOFOL;  Surgeon: Virgel Manifold, MD;  Location: [facility] ENDOSCOPY;  Service: Endoscopy;  Laterality: N/A;   MITRAL VALVE REPLACEMENT     MULTIPLE TOOTH EXTRACTIONS     TONSILLECTOMY     TOTAL KNEE ARTHROPLASTY Right 09/04/2016   Procedure: TOTAL KNEE ARTHROPLASTY; with lateral release;  Surgeon: Earlie Server, MD;  Location: [place];  Service: Orthopedics;  Laterality: Right;   Family History  Problem Relation Age of Onset   COPD Mother    Cancer Mother        Bone   Asthma Mother    Rheum arthritis Mother    Congestive Heart Failure Father    Breast cancer Maternal Aunt    Social History   Tobacco Use   Smoking status: Every Day    Packs/day: 0.10    Types: Cigarettes   Smokeless tobacco: Never   Tobacco comments:    4/20 was not able to quit.  Still at 1-3 a day. She  would like to wait to set a new quit date until we get back to class to have the extra in person support  Substance Use Topics   Alcohol use: Never    Alcohol/week: 3.0 standard drinks    Types: 3 Cans of beer per week    Comment: 16 oz per week   Allergies  Allergen Reactions   Amiodarone Nausea And Vomiting   Aspirin Swelling   Flexeril [Cyclobenzaprine] Swelling   Trazamine [Trazodone & Diet Manage Prod] Nausea And Vomiting   Codeine Rash   Tramadol Rash   Prior to Admission medications   Medication Sig Start Date End Date Taking? Authorizing Provider  albuterol (VENTOLIN HFA) 108 (90 Base) MCG/ACT inhaler Inhale 1-2 puffs into the lungs every 6 (six) hours as needed for wheezing or shortness of breath.   Yes [provider]  allopurinol (ZYLOPRIM) 100 MG tablet Take 1 tablet (100 mg total) by mouth 2 (two) times daily. 03/05/20  Yes Milinda Pointer, MD  Cholecalciferol (D 5000) 125 MCG (5000 UT) capsule Take by mouth daily. 11/14/18  Yes [provider]  Cholecalciferol (VITAMIN D) 50 MCG (2000 UT) CAPS Take 1 capsule (2,000 Units total) by mouth daily. 06/19/21  Yes Darylene Price A, FNP  colchicine 0.6 MG tablet Take 1 tablet (0.6 mg total) by mouth daily. As directed 03/05/20  Yes Milinda Pointer, MD  docusate sodium (COLACE) 100 MG capsule Take 100 mg by mouth 2 (two) times daily.   Yes [provider]  empagliflozin (JARDIANCE) 10 MG TABS tablet Take 10 mg by mouth daily.   Yes [provider]  Fluticasone-Umeclidin-Vilant (TRELEGY ELLIPTA) 100-62.5-25 MCG/INH AEPB Inhale 1 puff into the lungs daily.   Yes [provider]  furosemide (LASIX) 40 MG tablet TAKE 2 TABLETS BY MOUTH IN THE MORNING AND 1 TABLET AT NIGHT Patient taking differently: 40 mg 2 (two) times daily. 04/23/20  Yes Darylene Price A, FNP  gabapentin (NEURONTIN) 600 MG tablet Take 0.5 tablets (300 mg total) by mouth 2 (two) times daily. Patient taking differently: Take  600 mg by mouth 3 (three) times daily. 12/06/18  Yes Vaughan Basta, MD  ipratropium-albuterol (DUONEB) 0.5-2.5 (3) MG/3ML SOLN INHALE THE CONTENTS OF 1 VIAL(3MLS) VIA NEBULIZER 3 TIMES DAILY AS NEEDED 10/24/19  Yes Carles Collet M, PA-C  meclizine (ANTIVERT) 12.5 MG tablet TAKE 2 TABLETS (25 MG TOTAL) BY MOUTH 3 TIMES DAILY AS NEEDED FOR DIZZINESS 11/17/19  Yes Carles Collet M, PA-C  metolazone (ZAROXOLYN) 2.5 MG tablet Take 2.5 mg by mouth. Three times weekly   Yes [provider]  metoprolol tartrate (LOPRESSOR) 100 MG tablet Take 1 tablet (100 mg total) by mouth 2 (two) times daily. 12/21/18  Yes Maaz Spiering, Otila Kluver A, FNP  nitroGLYCERIN (NITROSTAT) 0.4 MG SL tablet Place 1 tablet (0.4 mg total)  under the tongue every 5 (five) minutes as needed for chest pain. 06/19/21  Yes Katherene Dinino, Otila Kluver A, FNP  nortriptyline (PAMELOR) 10 MG capsule Take 20 mg by mouth at bedtime.    Yes [provider]  omeprazole (PRILOSEC) 20 MG capsule TAKE 2 CAPSULES BY MOUTH TWICE A DAY BEFORE A MEAL 03/06/20  Yes Carles Collet M, PA-C  oxyCODONE-acetaminophen (PERCOCET/ROXICET) 5-325 MG tablet Take 1 tablet by mouth every 4 (four) hours as needed for severe pain. 11/03/20  Yes Paulette Blanch, MD  OXYGEN Inhale 4 L/L into the lungs.   Yes [provider]  potassium chloride (KLOR-CON) 10 MEQ tablet Take 40 mEq by mouth 2 (two) times daily. And additional 65meq on metolazone days 01/09/20  Yes [provider]  promethazine (PHENERGAN) 12.5 MG tablet Take 1 tablet (12.5 mg total) by mouth every 8 (eight) hours as needed for nausea or vomiting. 02/20/20  Yes Bacigalupo, Dionne Bucy, MD  butalbital-acetaminophen-caffeine (FIORICET, ESGIC) 50-325-40 MG tablet Take 1 tablet by mouth every 6 (six) hours as needed.  Patient not taking: Reported on 12/18/2021    [provider]  rivaroxaban (XARELTO) 20 MG TABS tablet Take 20 mg by mouth daily with supper. Patient not taking: Reported on 12/18/2021     [provider]  zolpidem (AMBIEN) 5 MG tablet Take 5 mg by mouth at bedtime as needed for sleep. Patient not taking: Reported on 12/18/2021    [provider]   Review of Systems  Constitutional:  Positive for fatigue (minimal). Negative for appetite change.  HENT:  Positive for nosebleeds (in the mornings). Negative for congestion, postnasal drip and sore throat.   Eyes:  Positive for visual disturbance (blurry vision at times).  Respiratory:  Positive for cough (dry) and shortness of breath (with moderate exertion). Negative for wheezing.   Cardiovascular:  Negative for chest pain, palpitations and leg swelling.  Gastrointestinal:  Negative for abdominal distention, abdominal pain and nausea.  Endocrine: Negative.   Genitourinary: Negative.   Musculoskeletal:  Positive for arthralgias (under left breast and up towards sternum). Negative for back pain and neck pain.  Skin: Negative.   Allergic/Immunologic: Negative.   Neurological:  Positive for dizziness ("vertigo") and numbness (outer left thigh). Negative for light-headedness.  Hematological:  Negative for adenopathy. Does not bruise/bleed easily.  Psychiatric/Behavioral:  Negative for dysphoric mood and sleep disturbance (oxygen at 4L at bedtime, 4 pillows). The patient is nervous/anxious.    Vitals:   12/18/21 1437  BP: (!) 124/52  Pulse: 88  Resp: 18  SpO2: 94%  Weight: 281 lb 2 oz (127.5 kg)  Height: 5\' 4"  (1.626 m)   Wt Readings from Last 3 Encounters:  12/18/21 281 lb 2 oz (127.5 kg)  06/19/21 (!) 301 lb 8 oz (136.8 kg)  12/26/20 (!) 310 lb 6 oz (140.8 kg)   Lab Results  Component Value Date   CREATININE 0.75 12/18/2021   CREATININE 0.54 11/02/2020   CREATININE 0.87 01/31/2020   Physical Exam Vitals reviewed.  Constitutional:      Appearance: She is well-developed.  HENT:     Head: Normocephalic and atraumatic.  Neck:     Vascular: No JVD.  Cardiovascular:     Rate and Rhythm: Normal  rate and regular rhythm.  Pulmonary:     Effort: Pulmonary effort is normal. No respiratory distress.     Breath sounds: No wheezing, rhonchi or rales.  Abdominal:     General: There is no distension.  Palpations: Abdomen is soft.  Musculoskeletal:     Cervical back: Normal range of motion and neck supple.     Right lower leg: No tenderness. No edema.     Left lower leg: No tenderness. No edema.  Skin:    General: Skin is warm and dry.  Neurological:     Mental Status: She is alert and oriented to person, place, and time.     Sensory: Sensation is intact.     Motor: Motor function is intact.  Psychiatric:        Mood and Affect: Mood is not anxious.        Behavior: Behavior normal.   Assessment & Plan:  1: Chronic heart failure with preserved ejection fraction with structural changes (LAE)- - NYHA class II - euvolemic today - weighing daily; reminded to call for an overnight weight gain of >2 pounds or a weekly weight gain of >5 pounds - weight down 19 pounds from last visit here 6 months ago - not adding salt and has been reading food labels. Reviewed the importance of closely following a 2000mg  sodium diet  - on GDMT of jardiance - consider adding entresto at next visit - saw cardiology ([facility]) 11/27/21 - saw pulmonology Lanney Gins) 05/28/20 - BNP 01/08/20 was 284.0  2: HTN- - BP looks good today (124/52) - now seeing PCP at [facility]; returns 01/13/22 - BMP 01/02/21 reviewed and showed sodium 140, potassium 3.2, creatinine 0.7 and GFR 106 - will recheck BMP today  3: Atrial fibrillation- - had mitral valve replaced July 2019 - cardioverted 06/30/18  4: DM- - A1c 03/08/20 was 6.7% - drinking a slushy in the office and she is aware of the high sugar content of this  5: Tobacco use- - says that she's smoking 6 cigarettes daily and isn't interested in stopping - complete cessation discussed for 3 minutes with her   Patient did not bring her medications  nor a list. Each medication was verbally reviewed with the patient and she was encouraged to bring the bottles to every visit to confirm accuracy of list.  Return in 3 months or sooner for any questions/problems before then.

## 2021-12-18 NOTE — Telephone Encounter (Signed)
Spoke with patient regarding her BMP results obtained from earlier today. Kidney function looks good but her potassium is low at 2.8.   She says that she takes the 10 meq tablets and that she takes 4 tablets BID and an additional 66meq on the days that she takes the metolazone which is 3 days/ week.   Advised her to take an additional 4 tablets (51meq) today and another 4 tablets (45meq) tomorrow. On the days that she takes the metolazone, she needs to take an additional 4 tablets (7meq). May need to also decrease her metolazone usage.   Will recheck labs on 12/25/21 at 1pm. Patient verbalized understanding.

## 2021-12-23 ENCOUNTER — Telehealth: Payer: Self-pay

## 2021-12-23 NOTE — Telephone Encounter (Signed)
Left message for patient for reminder that lab work is scheduled at [facility] at 1 PM tomorrow, 12/23/21. Provided clinic phone number if patient needs to reschedule or had further questions. Georg Ruddle, RN

## 2021-12-24 ENCOUNTER — Telehealth: Payer: Self-pay | Admitting: Family

## 2021-12-24 ENCOUNTER — Other Ambulatory Visit: Payer: Self-pay

## 2021-12-24 ENCOUNTER — Other Ambulatory Visit
Admission: RE | Admit: 2021-12-24 | Discharge: 2021-12-24 | Disposition: A | Discharge status: Home / Self Care | Payer: Medicaid Other | Source: Ambulatory Visit | Attending: Family | Admitting: Family

## 2021-12-24 DIAGNOSIS — I272 Pulmonary hypertension, unspecified: Secondary | ICD-10-CM | POA: Diagnosis not present

## 2021-12-24 DIAGNOSIS — R7303 Prediabetes: Secondary | ICD-10-CM | POA: Insufficient documentation

## 2021-12-24 DIAGNOSIS — Z01812 Encounter for preprocedural laboratory examination: Secondary | ICD-10-CM | POA: Diagnosis present

## 2021-12-24 DIAGNOSIS — I5032 Chronic diastolic (congestive) heart failure: Secondary | ICD-10-CM | POA: Diagnosis not present

## 2021-12-24 LAB — BASIC METABOLIC PANEL
Anion gap: 11 (ref 5–15)
BUN: 13 mg/dL (ref 6–20)
CO2: 35 mmol/L — ABNORMAL HIGH (ref 22–32)
Calcium: 8.7 mg/dL — ABNORMAL LOW (ref 8.9–10.3)
Chloride: 88 mmol/L — ABNORMAL LOW (ref 98–111)
Creatinine, Ser: 0.74 mg/dL (ref 0.44–1.00)
GFR, Estimated: >60 mL/min (ref >60)
Glucose, Bld: 113 mg/dL — ABNORMAL HIGH (ref 70–99)
Potassium: 2.8 mmol/L — ABNORMAL LOW (ref 3.5–5.1)
Sodium: 134 mmol/L — ABNORMAL LOW (ref 135–145)

## 2021-12-24 NOTE — Telephone Encounter (Signed)
Spoke with patient regarding BMP results from earlier today. Renal function looks great but potassium remains low at 2.8. She says that she did take the extra potassium as discussed last week.   Instructed her to decrease her metolazone to 2 x/week (was taking it 3x/ week). Also instructed to take an additional 64meq potassium today and another 56meq potassium tomorrow in addition to her normal 53meq BID.   Will recheck labs next week on 01/01/22. Patient verbalized understanding.

## 2021-12-25 ENCOUNTER — Other Ambulatory Visit: Payer: Medicaid Other

## 2021-12-31 ENCOUNTER — Telehealth: Payer: Self-pay

## 2021-12-31 NOTE — Telephone Encounter (Signed)
Called patient to remind her of lab work appointment in [facility] tomorrow, 01/01/22 at 1 PM. Patient needed to change this appointment time and requested to come at 1 PM on Friday 01/02/22. Patient informed we will notifiy [facility] of lab work appt being changed to 01/02/22 at 1 PM.

## 2022-01-01 ENCOUNTER — Other Ambulatory Visit: Payer: Medicaid Other

## 2022-01-02 ENCOUNTER — Encounter
Admission: RE | Admit: 2022-01-02 | Discharge: 2022-01-02 | Disposition: A | Discharge status: Home / Self Care | Payer: Medicaid Other | Source: Ambulatory Visit | Attending: Family | Admitting: Family

## 2022-01-02 ENCOUNTER — Other Ambulatory Visit: Payer: Self-pay | Admitting: Family

## 2022-01-02 ENCOUNTER — Other Ambulatory Visit: Payer: Self-pay

## 2022-01-02 ENCOUNTER — Other Ambulatory Visit
Admission: RE | Admit: 2022-01-02 | Discharge: 2022-01-02 | Disposition: A | Discharge status: Home / Self Care | Payer: Medicaid Other | Source: Other Acute Inpatient Hospital | Attending: Family | Admitting: Family

## 2022-01-02 ENCOUNTER — Other Ambulatory Visit: Payer: Medicaid Other

## 2022-01-02 DIAGNOSIS — Z01812 Encounter for preprocedural laboratory examination: Secondary | ICD-10-CM | POA: Insufficient documentation

## 2022-01-02 LAB — BASIC METABOLIC PANEL
Anion gap: 11 (ref 5–15)
BUN: 8 mg/dL (ref 6–20)
CO2: 36 mmol/L — ABNORMAL HIGH (ref 22–32)
Calcium: 9 mg/dL (ref 8.9–10.3)
Chloride: 90 mmol/L — ABNORMAL LOW (ref 98–111)
Creatinine, Ser: 0.78 mg/dL (ref 0.44–1.00)
GFR, Estimated: >60 mL/min (ref >60)
Glucose, Bld: 138 mg/dL — ABNORMAL HIGH (ref 70–99)
Potassium: 3.3 mmol/L — ABNORMAL LOW (ref 3.5–5.1)
Sodium: 137 mmol/L (ref 135–145)

## 2022-01-05 ENCOUNTER — Telehealth: Payer: Self-pay | Admitting: Family

## 2022-01-05 MED ORDER — POTASSIUM CHLORIDE ER 10 MEQ PO TBCR: 40.0000 meq | EXTENDED_RELEASE_TABLET | Freq: Two times a day (BID) | ORAL | 5 refills | Status: AC

## 2022-01-05 NOTE — Telephone Encounter (Signed)
Spoke with patient regarding BMP results obtained on 01/02/22. Renal function looks great and potassium has improved to 3.3.  She's currently taking metolazone 2.5mg  2 x/week and takes 54meq potassium BID and an additional 41meq on the days she takes the metolazone.   Advised patient to decrease her metolazone to once a week but if she needed to occasionally take a second dose she could. Continue potassium. Will recheck her labs at her next visit.   Patient verbalized understanding.

## 2022-01-16 ENCOUNTER — Other Ambulatory Visit: Payer: Self-pay | Admitting: Neurology

## 2022-01-16 DIAGNOSIS — R200 Anesthesia of skin: Secondary | ICD-10-CM

## 2022-01-16 DIAGNOSIS — I639 Cerebral infarction, unspecified: Secondary | ICD-10-CM

## 2022-01-16 DIAGNOSIS — R42 Dizziness and giddiness: Secondary | ICD-10-CM

## 2022-01-16 DIAGNOSIS — R519 Headache, unspecified: Secondary | ICD-10-CM

## 2022-01-16 DIAGNOSIS — H532 Diplopia: Secondary | ICD-10-CM

## 2022-02-01 ENCOUNTER — Ambulatory Visit
Admission: RE | Admit: 2022-02-01 | Discharge: 2022-02-01 | Disposition: A | Discharge status: Home / Self Care | Payer: Medicaid Other | Source: Ambulatory Visit | Attending: Neurology | Admitting: Neurology

## 2022-02-01 ENCOUNTER — Other Ambulatory Visit: Payer: Self-pay

## 2022-02-01 DIAGNOSIS — R42 Dizziness and giddiness: Secondary | ICD-10-CM

## 2022-02-01 DIAGNOSIS — R519 Headache, unspecified: Secondary | ICD-10-CM

## 2022-02-01 DIAGNOSIS — R200 Anesthesia of skin: Secondary | ICD-10-CM

## 2022-02-01 DIAGNOSIS — H532 Diplopia: Secondary | ICD-10-CM

## 2022-02-01 DIAGNOSIS — I639 Cerebral infarction, unspecified: Secondary | ICD-10-CM

## 2022-02-01 MED ORDER — GADOBENATE DIMEGLUMINE 529 MG/ML IV SOLN
20.0000 mL | Freq: Once | INTRAVENOUS | Status: AC | PRN
  Administered 2022-02-01: 20 mL via INTRAVENOUS

## 2022-03-21 NOTE — Progress Notes (Deleted)
Patient ID: Chelsea Ramsey, female    DOB: [birth year], 56 y.o.   MRN: [id number]   Chelsea Ramsey is a 56 y/o female with a history of CAD (leaky valve), HTN, pulmonary HTN, COPD, GERD, obstructive sleep apnea, previous tobacco use and chronic heart failure.   Echo report from 07/01/21 reviewed and showed an EF of 55%. Echo report from 01/30/20 reviewed and showed an EF of 55% along with mild MR/ moderate TR and moderate LAE. Echo reviewed from 09/06/19 and shows an EF of 50% along with a severely enlarged left atria. Echo report from 11/05/18 reviewed and showed an EF of 50-55% along with mild/moderate AR, mild MR,moderate TR and prosthetic mitral valve. Echo report from 08/11/18 reviewed and showed an EF of 40-45% which has declined from previous echo. + moderate TR and mild/moderately elevated PA pressure.  Echo report from 07/20/18 reviewed and showed an EF of 50-55% along with mild MR and an elevated PA pressure of 50 mm Hg.   Has not been admitted or been in the ED in the last 6 months.   She presents today for a follow-up visit with a chief complaint of   Past Medical History:  Diagnosis Date   Acute drug-induced gout of left foot 03/01/2018   Last Assessment & Plan:  Likely at least partially brought on by diuresis.  Needs to continue to diurese  Will push hydration Stop allopurinol given initiation during acute flare may worsen this, re-broach this when asymptomatic Avoid nsaids given stomach pain Trial colchicine Add acetaminophen Ice, elevate, rest   Allergy    seasonal   Anxiety    Arthritis    Right Knee   Asthma    CHF (congestive heart failure) (HCC)    COPD (chronic obstructive pulmonary disease) (HCC)    Coronary artery disease    Leaky heart valve   Dysrhythmia    Fibromyalgia    GERD (gastroesophageal reflux disease)    Hypertension    Pneumonia    PUD (peptic ulcer disease)    Pulmonary HTN (HCC)    Rheumatic fever/heart disease    Sleep apnea    Past Surgical  History:  Procedure Laterality Date   ADENOIDECTOMY     COLONOSCOPY WITH PROPOFOL N/A 11/06/2019   Procedure: COLONOSCOPY WITH PROPOFOL;  Surgeon: Virgel Manifold, MD;  Location: [facility] ENDOSCOPY;  Service: Endoscopy;  Laterality: N/A;  2 day prep    CORONARY ARTERY BYPASS GRAFT     June 27 2018   ESOPHAGOGASTRODUODENOSCOPY (EGD) WITH PROPOFOL N/A 05/25/2018   Procedure: ESOPHAGOGASTRODUODENOSCOPY (EGD) WITH PROPOFOL;  Surgeon: Lucilla Lame, MD;  Location: [name] ENDOSCOPY;  Service: Endoscopy;  Laterality: N/A;   ESOPHAGOGASTRODUODENOSCOPY (EGD) WITH PROPOFOL N/A 11/06/2019   Procedure: ESOPHAGOGASTRODUODENOSCOPY (EGD) WITH PROPOFOL;  Surgeon: Virgel Manifold, MD;  Location: [facility] ENDOSCOPY;  Service: Endoscopy;  Laterality: N/A;   MITRAL VALVE REPLACEMENT     MULTIPLE TOOTH EXTRACTIONS     TONSILLECTOMY     TOTAL KNEE ARTHROPLASTY Right 09/04/2016   Procedure: TOTAL KNEE ARTHROPLASTY; with lateral release;  Surgeon: Earlie Server, MD;  Location: [place];  Service: Orthopedics;  Laterality: Right;   Family History  Problem Relation Age of Onset   COPD Mother    Cancer Mother        Bone   Asthma Mother    Rheum arthritis Mother    Congestive Heart Failure Father    Breast cancer Maternal Aunt    Social History   Tobacco Use  Smoking status: Every Day    Packs/day: 0.10    Types: Cigarettes   Smokeless tobacco: Never   Tobacco comments:    4/20 was not able to quit.  Still at 1-3 a day. She would like to wait to set a new quit date until we get back to class to have the extra in person support  Substance Use Topics   Alcohol use: Never    Alcohol/week: 3.0 standard drinks    Types: 3 Cans of beer per week    Comment: 16 oz per week   Allergies  Allergen Reactions   Amiodarone Nausea And Vomiting   Aspirin Swelling   Flexeril [Cyclobenzaprine] Swelling   Trazamine [Trazodone & Diet Manage Prod] Nausea And Vomiting   Codeine Rash   Tramadol Rash    Review of Systems   Constitutional:  Positive for fatigue (minimal). Negative for appetite change.  HENT:  Positive for nosebleeds (in the mornings). Negative for congestion, postnasal drip and sore throat.   Eyes:  Positive for visual disturbance (blurry vision at times).  Respiratory:  Positive for cough (dry) and shortness of breath (with moderate exertion). Negative for wheezing.   Cardiovascular:  Negative for chest pain, palpitations and leg swelling.  Gastrointestinal:  Negative for abdominal distention, abdominal pain and nausea.  Endocrine: Negative.   Genitourinary: Negative.   Musculoskeletal:  Positive for arthralgias (under left breast and up towards sternum). Negative for back pain and neck pain.  Skin: Negative.   Allergic/Immunologic: Negative.   Neurological:  Positive for dizziness ("vertigo") and numbness (outer left thigh). Negative for light-headedness.  Hematological:  Negative for adenopathy. Does not bruise/bleed easily.  Psychiatric/Behavioral:  Negative for dysphoric mood and sleep disturbance (oxygen at 4L at bedtime, 4 pillows). The patient is nervous/anxious.      Physical Exam Vitals reviewed.  Constitutional:      Appearance: She is well-developed.  HENT:     Head: Normocephalic and atraumatic.  Neck:     Vascular: No JVD.  Cardiovascular:     Rate and Rhythm: Normal rate and regular rhythm.  Pulmonary:     Effort: Pulmonary effort is normal. No respiratory distress.     Breath sounds: No wheezing, rhonchi or rales.  Abdominal:     General: There is no distension.     Palpations: Abdomen is soft.  Musculoskeletal:     Cervical back: Normal range of motion and neck supple.     Right lower leg: No tenderness. No edema.     Left lower leg: No tenderness. No edema.  Skin:    General: Skin is warm and dry.  Neurological:     Mental Status: She is alert and oriented to person, place, and time.     Sensory: Sensation is intact.     Motor: Motor function is intact.   Psychiatric:        Mood and Affect: Mood is not anxious.        Behavior: Behavior normal.   Assessment & Plan:  1: Chronic heart failure with preserved ejection fraction with structural changes (LAE)- - NYHA class II - euvolemic today - weighing daily; reminded to call for an overnight weight gain of >2 pounds or a weekly weight gain of >5 pounds - weight 281.2 pounds from last visit here 3 months ago - not adding salt and has been reading food labels. Reviewed the importance of closely following a '2000mg'$  sodium diet  - on GDMT of jardiance - consider adding entresto  at next visit - saw cardiology ([place]) 11/27/21 - saw pulmonology Lanney Gins) 05/28/20 - BNP 01/08/20 was 284.0  2: HTN- - BP  - now seeing PCP at [facility]; returns 01/13/22 - BMP 01/02/21 reviewed and showed sodium 137, potassium 3.3, creatinine 0.78 and GFR >60  3: Atrial fibrillation- - had mitral valve replaced July 2019 - cardioverted 06/30/18  4: DM- - A1c 03/08/20 was 6.7% - drinking a slushy in the office and she is aware of the high sugar content of this  5: Tobacco use- - says that she's smoking 6 cigarettes daily and isn't interested in stopping - complete cessation discussed for 3 minutes with her   Patient did not bring her medications nor a list. Each medication was verbally reviewed with the patient and she was encouraged to bring the bottles to every visit to confirm accuracy of list.

## 2022-03-23 ENCOUNTER — Ambulatory Visit: Payer: Medicaid Other | Admitting: Family

## 2022-04-07 ENCOUNTER — Ambulatory Visit: Payer: Medicaid Other | Admitting: Family

## 2022-04-16 ENCOUNTER — Ambulatory Visit: Payer: Medicaid Other | Admitting: Family

## 2022-04-23 ENCOUNTER — Ambulatory Visit: Payer: Medicaid Other | Admitting: Family

## 2022-05-02 NOTE — Progress Notes (Deleted)
Patient ID: Chelsea Ramsey, female    DOB: [birth year], 56 y.o.   MRN: [id number]   Chelsea Ramsey is a 56 y/o female with a history of CAD (leaky valve), HTN, pulmonary HTN, COPD, GERD, obstructive sleep apnea, previous tobacco use and chronic heart failure.   Echo report from 07/01/21 reviewed and showed an EF of 55%. Echo report from 01/30/20 reviewed and showed an EF of 55% along with mild MR/ moderate TR and moderate LAE. Echo reviewed from 09/06/19 and shows an EF of 50% along with a severely enlarged left atria. Echo report from 11/05/18 reviewed and showed an EF of 50-55% along with mild/moderate AR, mild MR,moderate TR and prosthetic mitral valve. Echo report from 08/11/18 reviewed and showed an EF of 40-45% which has declined from previous echo. + moderate TR and mild/moderately elevated PA pressure.  Echo report from 07/20/18 reviewed and showed an EF of 50-55% along with mild MR and an elevated PA pressure of 50 mm Hg.   Has not been admitted or been in the ED in the last 6 months.   She presents today for a follow-up visit with a chief complaint of   Past Medical History:  Diagnosis Date   Acute drug-induced gout of left foot 03/01/2018   Last Assessment & Plan:  Likely at least partially brought on by diuresis.  Needs to continue to diurese  Will push hydration Stop allopurinol given initiation during acute flare may worsen this, re-broach this when asymptomatic Avoid nsaids given stomach pain Trial colchicine Add acetaminophen Ice, elevate, rest   Allergy    seasonal   Anxiety    Arthritis    Right Knee   Asthma    CHF (congestive heart failure) (HCC)    COPD (chronic obstructive pulmonary disease) (HCC)    Coronary artery disease    Leaky heart valve   Dysrhythmia    Fibromyalgia    GERD (gastroesophageal reflux disease)    Hypertension    Pneumonia    PUD (peptic ulcer disease)    Pulmonary HTN (HCC)    Rheumatic fever/heart disease    Sleep apnea    Past Surgical  History:  Procedure Laterality Date   ADENOIDECTOMY     COLONOSCOPY WITH PROPOFOL N/A 11/06/2019   Procedure: COLONOSCOPY WITH PROPOFOL;  Surgeon: Virgel Manifold, MD;  Location: [facility] ENDOSCOPY;  Service: Endoscopy;  Laterality: N/A;  2 day prep    CORONARY ARTERY BYPASS GRAFT     June 27 2018   ESOPHAGOGASTRODUODENOSCOPY (EGD) WITH PROPOFOL N/A 05/25/2018   Procedure: ESOPHAGOGASTRODUODENOSCOPY (EGD) WITH PROPOFOL;  Surgeon: Lucilla Lame, MD;  Location[facility] ENDOSCOPY;  Service: Endoscopy;  Laterality: N/A;   ESOPHAGOGASTRODUODENOSCOPY (EGD) WITH PROPOFOL N/A 11/06/2019   Procedure: ESOPHAGOGASTRODUODENOSCOPY (EGD) WITH PROPOFOL;  Surgeon: Virgel Manifold, MD;  Location: [facility] ENDOSCOPY;  Service: Endoscopy;  Laterality: N/A;   MITRAL VALVE REPLACEMENT     MULTIPLE TOOTH EXTRACTIONS     TONSILLECTOMY     TOTAL KNEE ARTHROPLASTY Right 09/04/2016   Procedure: TOTAL KNEE ARTHROPLASTY; with lateral release;  Surgeon: Earlie Server, MD;  Location: [place];  Service: Orthopedics;  Laterality: Right;   Family History  Problem Relation Age of Onset   COPD Mother    Cancer Mother        Bone   Asthma Mother    Rheum arthritis Mother    Congestive Heart Failure Father    Breast cancer Maternal Aunt    Social History   Tobacco Use  Smoking status: Every Day    Packs/day: 0.10    Types: Cigarettes   Smokeless tobacco: Never   Tobacco comments:    4/20 was not able to quit.  Still at 1-3 a day. She would like to wait to set a new quit date until we get back to class to have the extra in person support  Substance Use Topics   Alcohol use: Never    Alcohol/week: 3.0 standard drinks    Types: 3 Cans of beer per week    Comment: 16 oz per week   Allergies  Allergen Reactions   Amiodarone Nausea And Vomiting   Aspirin Swelling   Flexeril [Cyclobenzaprine] Swelling   Trazamine [Trazodone & Diet Manage Prod] Nausea And Vomiting   Codeine Rash   Tramadol Rash    Review of Systems   Constitutional:  Positive for fatigue (minimal). Negative for appetite change.  HENT:  Positive for nosebleeds (in the mornings). Negative for congestion, postnasal drip and sore throat.   Eyes:  Positive for visual disturbance (blurry vision at times).  Respiratory:  Positive for cough (dry) and shortness of breath (with moderate exertion). Negative for wheezing.   Cardiovascular:  Negative for chest pain, palpitations and leg swelling.  Gastrointestinal:  Negative for abdominal distention, abdominal pain and nausea.  Endocrine: Negative.   Genitourinary: Negative.   Musculoskeletal:  Positive for arthralgias (under left breast and up towards sternum). Negative for back pain and neck pain.  Skin: Negative.   Allergic/Immunologic: Negative.   Neurological:  Positive for dizziness ("vertigo") and numbness (outer left thigh). Negative for light-headedness.  Hematological:  Negative for adenopathy. Does not bruise/bleed easily.  Psychiatric/Behavioral:  Negative for dysphoric mood and sleep disturbance (oxygen at 4L at bedtime, 4 pillows). The patient is nervous/anxious.      Physical Exam Vitals reviewed.  Constitutional:      Appearance: She is well-developed.  HENT:     Head: Normocephalic and atraumatic.  Neck:     Vascular: No JVD.  Cardiovascular:     Rate and Rhythm: Normal rate and regular rhythm.  Pulmonary:     Effort: Pulmonary effort is normal. No respiratory distress.     Breath sounds: No wheezing, rhonchi or rales.  Abdominal:     General: There is no distension.     Palpations: Abdomen is soft.  Musculoskeletal:     Cervical back: Normal range of motion and neck supple.     Right lower leg: No tenderness. No edema.     Left lower leg: No tenderness. No edema.  Skin:    General: Skin is warm and dry.  Neurological:     Mental Status: She is alert and oriented to person, place, and time.     Sensory: Sensation is intact.     Motor: Motor function is intact.   Psychiatric:        Mood and Affect: Mood is not anxious.        Behavior: Behavior normal.   Assessment & Plan:  1: Chronic heart failure with preserved ejection fraction with structural changes (LAE)- - NYHA class II - euvolemic today - weighing daily; reminded to call for an overnight weight gain of >2 pounds or a weekly weight gain of >5 pounds - weight 282.2 pounds from last visit here 4.5 months ago - not adding salt and has been reading food labels. Reviewed the importance of closely following a '2000mg'$  sodium diet  - on GDMT of jardiance - consider adding entresto  at next visit - saw cardiology ([place]) 11/27/21 - saw pulmonology Lanney Gins) 05/28/20 - BNP 01/08/20 was 284.0  2: HTN- - BP  - now seeing PCP at [facility]; returns 01/13/22 - BMP 01/02/22 reviewed and showed sodium 137, potassium 3.3, creatinine 0.78 and GFR 106 - recheck labs today  3: Atrial fibrillation- - had mitral valve replaced July 2019 - cardioverted 06/30/18  4: DM- - A1c 03/08/20 was 6.7%  5: Tobacco use- - says that she's smoking 6 cigarettes daily and isn't interested in stopping - complete cessation discussed for 3 minutes with her   Patient did not bring her medications nor a list. Each medication was verbally reviewed with the patient and she was encouraged to bring the bottles to every visit to confirm accuracy of list.

## 2022-05-04 ENCOUNTER — Encounter: Payer: Self-pay | Admitting: Otolaryngology

## 2022-05-04 ENCOUNTER — Ambulatory Visit: Payer: Medicaid Other | Admitting: Family

## 2022-05-04 ENCOUNTER — Telehealth: Payer: Self-pay | Admitting: Family

## 2022-05-04 NOTE — Telephone Encounter (Signed)
Patient did not show for her [facility] appointment on 05/04/22. Will attempt to reschedule.

## 2022-06-24 ENCOUNTER — Telehealth: Payer: Self-pay

## 2022-06-24 ENCOUNTER — Other Ambulatory Visit: Payer: Self-pay

## 2022-06-24 DIAGNOSIS — D509 Iron deficiency anemia, unspecified: Secondary | ICD-10-CM

## 2022-06-24 MED ORDER — NA SULFATE-K SULFATE-MG SULF 17.5-3.13-1.6 GM/177ML PO SOLN: 354.0000 mL | Freq: Once | ORAL | 0 refills | Status: AC

## 2022-06-24 MED ORDER — NA SULFATE-K SULFATE-MG SULF 17.5-3.13-1.6 GM/177ML PO SOLN: 354.0000 mL | Freq: Once | ORAL | 0 refills | Status: DC

## 2022-06-24 NOTE — Telephone Encounter (Signed)
Patient verablized understanding she will come for labs today or tomorrow. She state cardiologist told her to stop the Xarelto yesterday so she did not take one last night and was told not to take till after she talk to her GI doctor. She states she wants to get it done ASAP. Schedule for 06/29/2022 she state her last colonoscopy she was not clean out so have her a 2 day prep. She will pick up instructions when she come for labs. Went over instructions with her

## 2022-06-24 NOTE — Addendum Note (Signed)
Addended by: Ulyess Blossom L on: 06/24/2022 11:44 AM   Modules accepted: Orders

## 2022-06-24 NOTE — Telephone Encounter (Signed)
Patient sent mychart message about scheduling a appointment. I made her a appointment for 09/30/2022 with Dr. [name] since she saw Dr Chelsea Ramsey on 10/18/2020. Patient sent another message back and states  I'm trying to get in to see dr.asap Chelsea Ramsey my pn at cardiologist did labs yesterday and she said I'm bleeding from some where and my hemoglobin has dropped badly anemia is worse it was 11.9 now it's down 9 and I'm black stool and stomach pains and cramps  Called patient and left a message to inform her she has to see Dr. [name] because she saw her on 11/21 and if she wants to switch to Dr. [name] I would have to get approval. Please advise what you recommend for the abdominal cramping, anemia, and black stools.

## 2022-06-24 NOTE — Telephone Encounter (Signed)
Reviewed her chart in detail.  Patient has history of chronic iron deficiency anemia.  If she is having black stools, please check with patient if she is taking any oral iron which can cause black stools.  Her BUNs/creatinine is normal which indicates no active GI bleed at this time Nonetheless, she will need reevaluation for worsening iron deficiency anemia which will include repeat upper endoscopy, colonoscopy and possible video capsule endoscopy if patient is agreeable.  She will also need cardiac clearance for interruption of blood thinner/Xarelto  Recommend to check iron panel, B12 and folate levels  Sherri Sear, MD

## 2022-06-25 ENCOUNTER — Telehealth: Payer: Self-pay

## 2022-06-25 LAB — IRON,TIBC AND FERRITIN PANEL
Ferritin: 26 ng/mL (ref 15–150)
Iron Saturation: 8 % — CL (ref 15–55)
Iron: 44 ug/dL (ref 27–159)
Total Iron Binding Capacity: 519 ug/dL — ABNORMAL HIGH (ref 250–450)
UIBC: 475 ug/dL — ABNORMAL HIGH (ref 131–425)

## 2022-06-25 LAB — B12 AND FOLATE PANEL
Folate: >20 ng/mL (ref >3.0)
Vitamin B-12: 1023 pg/mL (ref 232–1245)

## 2022-06-25 MED ORDER — FUSION PLUS PO CAPS: 1.0000 | ORAL_CAPSULE | Freq: Every day | ORAL | 3 refills | Status: AC

## 2022-06-25 NOTE — Telephone Encounter (Signed)
Patient verbalized understanding of results. She will pick up the fusion plus from pharmacy but not start it till after procedure

## 2022-06-25 NOTE — Telephone Encounter (Signed)
-----   Message from Lin Landsman, MD sent at 06/25/2022 12:10 PM EDT ----- Labs revealed severe iron deficiency anemia.  Recommend to start taking fusion plus daily.  However, she may not be able to start until after the procedures.  RV

## 2022-06-26 ENCOUNTER — Encounter: Payer: Self-pay | Admitting: Gastroenterology

## 2022-06-29 ENCOUNTER — Encounter
Admission: RE | Disposition: A | Discharge status: Home / Self Care | Payer: Self-pay | Source: Home / Self Care | Attending: Gastroenterology

## 2022-06-29 ENCOUNTER — Ambulatory Visit
Admission: RE | Admit: 2022-06-29 | Discharge: 2022-06-29 | Disposition: A | Discharge status: Home / Self Care | Payer: Medicaid Other | Attending: Gastroenterology | Admitting: Gastroenterology

## 2022-06-29 ENCOUNTER — Ambulatory Visit: Payer: Medicaid Other | Admitting: Anesthesiology

## 2022-06-29 DIAGNOSIS — J449 Chronic obstructive pulmonary disease, unspecified: Secondary | ICD-10-CM | POA: Diagnosis not present

## 2022-06-29 DIAGNOSIS — I2510 Atherosclerotic heart disease of native coronary artery without angina pectoris: Secondary | ICD-10-CM | POA: Diagnosis not present

## 2022-06-29 DIAGNOSIS — F17210 Nicotine dependence, cigarettes, uncomplicated: Secondary | ICD-10-CM | POA: Diagnosis not present

## 2022-06-29 DIAGNOSIS — E119 Type 2 diabetes mellitus without complications: Secondary | ICD-10-CM | POA: Diagnosis not present

## 2022-06-29 DIAGNOSIS — M797 Fibromyalgia: Secondary | ICD-10-CM | POA: Insufficient documentation

## 2022-06-29 DIAGNOSIS — M109 Gout, unspecified: Secondary | ICD-10-CM | POA: Diagnosis not present

## 2022-06-29 DIAGNOSIS — K6389 Other specified diseases of intestine: Secondary | ICD-10-CM | POA: Insufficient documentation

## 2022-06-29 DIAGNOSIS — Z9981 Dependence on supplemental oxygen: Secondary | ICD-10-CM | POA: Diagnosis not present

## 2022-06-29 DIAGNOSIS — D509 Iron deficiency anemia, unspecified: Secondary | ICD-10-CM | POA: Insufficient documentation

## 2022-06-29 DIAGNOSIS — K219 Gastro-esophageal reflux disease without esophagitis: Secondary | ICD-10-CM | POA: Diagnosis not present

## 2022-06-29 DIAGNOSIS — Z8711 Personal history of peptic ulcer disease: Secondary | ICD-10-CM | POA: Insufficient documentation

## 2022-06-29 DIAGNOSIS — Z952 Presence of prosthetic heart valve: Secondary | ICD-10-CM | POA: Diagnosis not present

## 2022-06-29 DIAGNOSIS — I272 Pulmonary hypertension, unspecified: Secondary | ICD-10-CM | POA: Diagnosis not present

## 2022-06-29 DIAGNOSIS — I509 Heart failure, unspecified: Secondary | ICD-10-CM | POA: Insufficient documentation

## 2022-06-29 DIAGNOSIS — I110 Hypertensive heart disease with heart failure: Secondary | ICD-10-CM | POA: Insufficient documentation

## 2022-06-29 DIAGNOSIS — Z7984 Long term (current) use of oral hypoglycemic drugs: Secondary | ICD-10-CM | POA: Insufficient documentation

## 2022-06-29 DIAGNOSIS — Z951 Presence of aortocoronary bypass graft: Secondary | ICD-10-CM | POA: Insufficient documentation

## 2022-06-29 DIAGNOSIS — K3189 Other diseases of stomach and duodenum: Secondary | ICD-10-CM | POA: Insufficient documentation

## 2022-06-29 DIAGNOSIS — G4730 Sleep apnea, unspecified: Secondary | ICD-10-CM | POA: Diagnosis not present

## 2022-06-29 DIAGNOSIS — Z8249 Family history of ischemic heart disease and other diseases of the circulatory system: Secondary | ICD-10-CM | POA: Insufficient documentation

## 2022-06-29 HISTORY — PX: COLONOSCOPY WITH PROPOFOL: SHX5780

## 2022-06-29 HISTORY — DX: Type 2 diabetes mellitus without complications: E11.9

## 2022-06-29 HISTORY — PX: ESOPHAGOGASTRODUODENOSCOPY (EGD) WITH PROPOFOL: SHX5813

## 2022-06-29 LAB — GLUCOSE, CAPILLARY: Glucose-Capillary: 99 mg/dL (ref 70–99)

## 2022-06-29 SURGERY — COLONOSCOPY WITH PROPOFOL
Anesthesia: General

## 2022-06-29 MED ORDER — PHENYLEPHRINE HCL (PRESSORS) 10 MG/ML IV SOLN
INTRAVENOUS | Status: DC | PRN
  Administered 2022-06-29: 160 ug via INTRAVENOUS

## 2022-06-29 MED ORDER — PROPOFOL 1000 MG/100ML IV EMUL
INTRAVENOUS | Status: AC
  Filled 2022-06-29: qty 100

## 2022-06-29 MED ORDER — PROPOFOL 500 MG/50ML IV EMUL
INTRAVENOUS | Status: DC | PRN
  Administered 2022-06-29: 150 ug/kg/min via INTRAVENOUS

## 2022-06-29 MED ORDER — PROPOFOL 10 MG/ML IV BOLUS
INTRAVENOUS | Status: DC | PRN
  Administered 2022-06-29: 20 mg via INTRAVENOUS
  Administered 2022-06-29: 60 mg via INTRAVENOUS
  Administered 2022-06-29: 20 mg via INTRAVENOUS

## 2022-06-29 MED ORDER — ONDANSETRON HCL 4 MG/2ML IJ SOLN
INTRAMUSCULAR | Status: DC | PRN
  Administered 2022-06-29: 4 mg via INTRAVENOUS

## 2022-06-29 MED ORDER — SODIUM CHLORIDE 0.9 % IV SOLN
INTRAVENOUS | Status: DC
  Administered 2022-06-29: 20 mL/h via INTRAVENOUS

## 2022-06-29 NOTE — Anesthesia Postprocedure Evaluation (Signed)
Anesthesia Post Note  Patient: Chelsea Ramsey  Procedure(s) Performed: COLONOSCOPY WITH PROPOFOL ESOPHAGOGASTRODUODENOSCOPY (EGD) WITH PROPOFOL  Patient location during evaluation: Endoscopy Anesthesia Type: General Level of consciousness: awake and alert Pain management: pain level controlled Vital Signs Assessment: post-procedure vital signs reviewed and stable Respiratory status: spontaneous breathing, nonlabored ventilation, respiratory function stable and patient connected to nasal cannula oxygen Cardiovascular status: blood pressure returned to baseline and stable Postop Assessment: no apparent nausea or vomiting Anesthetic complications: no   No notable events documented.   Last Vitals:  Vitals:   06/29/22 0944 06/29/22 0954  BP: 109/80 112/74  Pulse: 77 71  Resp: 17 18  Temp:    SpO2: 97% 99%    Last Pain:  Vitals:   06/29/22 0954  TempSrc:   PainSc: 8                  Precious Haws Dionne Rossa

## 2022-06-29 NOTE — Op Note (Signed)
[facility] Gastroenterology Patient Name: Chelsea Ramsey Procedure Date: 06/29/2022 8:35 AM MRN: [id number] Account #: [id number] Date of Birth: July 31, 1966 Admit Type: Outpatient Age: 56 Room: [facility] ENDO ROOM 4 Gender: Female Note Status: Finalized Instrument Name: Upper Endoscope [id number] Procedure:             Upper GI endoscopy Indications:           Unexplained iron deficiency anemia Providers:             Lin Landsman MD, MD Referring MD:          Emelia Loron (Referring MD) Medicines:             General Anesthesia Complications:         No immediate complications. Estimated blood loss: None. Procedure:             Pre-Anesthesia Assessment:                        - Prior to the procedure, a History and Physical was                         performed, and patient medications and allergies were                         reviewed. The patient is competent. The risks and                         benefits of the procedure and the sedation options and                         risks were discussed with the patient. All questions                         were answered and informed consent was obtained.                         Patient identification and proposed procedure were                         verified by the physician, the nurse, the                         anesthesiologist, the anesthetist and the technician                         in the pre-procedure area in the procedure room in the                         endoscopy suite. Mental Status Examination: alert and                         oriented. Airway Examination: normal oropharyngeal                         airway and neck mobility. Respiratory Examination:                         clear to auscultation. CV Examination: normal.  Prophylactic Antibiotics: The patient does not require                         prophylactic antibiotics. Prior Anticoagulants: The                          patient has taken Xarelto (rivaroxaban), last dose was                         3 days prior to procedure. ASA Grade Assessment: III -                         A patient with severe systemic disease. After                         reviewing the risks and benefits, the patient was                         deemed in satisfactory condition to undergo the                         procedure. The anesthesia plan was to use general                         anesthesia. Immediately prior to administration of                         medications, the patient was re-assessed for adequacy                         to receive sedatives. The heart rate, respiratory                         rate, oxygen saturations, blood pressure, adequacy of                         pulmonary ventilation, and response to care were                         monitored throughout the procedure. The physical                         status of the patient was re-assessed after the                         procedure.                        After obtaining informed consent, the endoscope was                         passed under direct vision. Throughout the procedure,                         the patient's blood pressure, pulse, and oxygen                         saturations were monitored continuously. The  Endosonoscope was introduced through the mouth, and                         advanced to the second part of duodenum. The upper GI                         endoscopy was accomplished without difficulty. The                         patient tolerated the procedure well. Findings:      The duodenal bulb and second portion of the duodenum were normal.      Localized moderately erythematous mucosa without bleeding was found in       the gastric antrum. Biopsies were taken with a cold forceps for       histology.      The gastric body and incisura were normal. Biopsies were taken with a       cold forceps for histology.       The cardia and gastric fundus were normal on retroflexion.      The gastroesophageal junction and examined esophagus were normal. Impression:            - Normal duodenal bulb and second portion of the                         duodenum.                        - Erythematous mucosa in the antrum. Biopsied.                        - Normal gastric body and incisura. Biopsied.                        - Normal gastroesophageal junction and esophagus. Recommendation:        - Await pathology results.                        - Proceed with colonoscopy as scheduled                        See colonoscopy report Procedure Code(s):     --- Professional ---                        [phone], Esophagogastroduodenoscopy, flexible,                         transoral; with biopsy, single or multiple Diagnosis Code(s):     --- Professional ---                        K31.89, Other diseases of stomach and duodenum                        D50.9, Iron deficiency anemia, unspecified CPT copyright 2019 American Medical Association. All rights reserved. The codes documented in this report are preliminary and upon coder review may  be revised to meet current compliance requirements. Dr. Ulyess Mort Lin Landsman MD, MD 06/29/2022 9:19:09 AM This report has been signed electronically. Number of Addenda: 0 Note Initiated On: 06/29/2022 8:35  AM Estimated Blood Loss:  Estimated blood loss: none.      [facility]

## 2022-06-29 NOTE — Op Note (Signed)
[facility] Gastroenterology Patient Name: Chelsea Ramsey Procedure Date: 06/29/2022 8:35 AM MRN: [id number] Account #: [id number] Date of Birth: 11-21-1966 Admit Type: Outpatient Age: 56 Room: [facility] ENDO ROOM 4 Gender: Female Note Status: Finalized Instrument Name: Colonscope [id number] Procedure:             Colonoscopy Indications:           Last colonoscopy: December 2020, Unexplained iron                         deficiency anemia Providers:             Lin Landsman MD, MD Referring MD:          Emelia Loron (Referring MD) Medicines:             General Anesthesia Complications:         No immediate complications. Estimated blood loss: None. Procedure:             Pre-Anesthesia Assessment:                        - Prior to the procedure, a History and Physical was                         performed, and patient medications and allergies were                         reviewed. The patient is competent. The risks and                         benefits of the procedure and the sedation options and                         risks were discussed with the patient. All questions                         were answered and informed consent was obtained.                         Patient identification and proposed procedure were                         verified by the physician, the nurse, the                         anesthesiologist, the anesthetist and the technician                         in the pre-procedure area in the procedure room.                         Mental Status Examination: normal. Airway Examination:                         normal oropharyngeal airway and neck mobility.                         Respiratory Examination: clear to auscultation. CV  Examination: normal. Prophylactic Antibiotics: The                         patient does not require prophylactic antibiotics.                         Prior Anticoagulants: The patient has taken  Xarelto                         (rivaroxaban), last dose was 3 days prior to                         procedure. ASA Grade Assessment: III - A patient with                         severe systemic disease. After reviewing the risks and                         benefits, the patient was deemed in satisfactory                         condition to undergo the procedure. The anesthesia                         plan was to use general anesthesia. Immediately prior                         to administration of medications, the patient was                         re-assessed for adequacy to receive sedatives. The                         heart rate, respiratory rate, oxygen saturations,                         blood pressure, adequacy of pulmonary ventilation, and                         response to care were monitored throughout the                         procedure. The physical status of the patient was                         re-assessed after the procedure.                        After obtaining informed consent, the colonoscope was                         passed under direct vision. Throughout the procedure,                         the patient's blood pressure, pulse, and oxygen                         saturations were monitored continuously. The  Colonoscope was introduced through the anus and                         advanced to the the terminal ileum, with                         identification of the appendiceal orifice and IC                         valve. The colonoscopy was performed without                         difficulty. The patient tolerated the procedure well.                         The quality of the bowel preparation was evaluated                         using the BBPS [facility] Bowel Preparation Scale) with                         scores of: Right Colon = 3, Transverse Colon = 3 and                         Left Colon = 3 (entire mucosa seen well with no                          residual staining, small fragments of stool or opaque                         liquid). The total BBPS score equals 9. Findings:      The perianal and digital rectal examinations were normal. Pertinent       negatives include normal sphincter tone and no palpable rectal lesions.      The terminal ileum appeared normal.      The entire examined colon appeared normal.      A diffuse area of moderate melanosis was found in the entire colon.      The retroflexed view of the distal rectum and anal verge was normal and       showed no anal or rectal abnormalities. Impression:            - The examined portion of the ileum was normal.                        - The entire examined colon is normal.                        - Melanosis in the colon.                        - The distal rectum and anal verge are normal on                         retroflexion view.                        - No specimens collected. Recommendation:        -  Discharge patient to home (with escort).                        - Resume previous diet today.                        - Continue present medications.                        - Resume Xarelto (rivaroxaban) today at prior dose.                         Refer to managing physician for further adjustment of                         therapy. Procedure Code(s):     --- Professional ---                        [phone], Colonoscopy, flexible; diagnostic, including                         collection of specimen(s) by brushing or washing, when                         performed (separate procedure) Diagnosis Code(s):     --- Professional ---                        K63.89, Other specified diseases of intestine                        D50.9, Iron deficiency anemia, unspecified CPT copyright 2019 American Medical Association. All rights reserved. The codes documented in this report are preliminary and upon coder review may  be revised to meet current compliance  requirements. Dr. Ulyess Mort Lin Landsman MD, MD 06/29/2022 9:16:26 AM This report has been signed electronically. Number of Addenda: 0 Note Initiated On: 06/29/2022 8:35 AM Scope Withdrawal Time: 0 hours 4 minutes 9 seconds  Total Procedure Duration: 0 hours 9 minutes 25 seconds  Estimated Blood Loss:  Estimated blood loss: none.      [facility]

## 2022-06-29 NOTE — Anesthesia Procedure Notes (Signed)
Date/Time: 06/29/2022 8:44 AM  Performed by: Johnna Acosta, CRNAPre-anesthesia Checklist: Patient identified, Emergency Drugs available, Suction available, Patient being monitored and Timeout performed Patient Re-evaluated:Patient Re-evaluated prior to induction Oxygen Delivery Method: Supernova nasal CPAP Preoxygenation: Pre-oxygenation with 100% oxygen

## 2022-06-29 NOTE — Anesthesia Preprocedure Evaluation (Signed)
Anesthesia Evaluation  Patient identified by MRN, date of birth, ID band Patient awake    Reviewed: Allergy & Precautions, NPO status , Patient's Chart, lab work & pertinent test results  History of Anesthesia Complications Negative for: history of anesthetic complications  Airway Mallampati: III  TM Distance: >3 FB Neck ROM: full    Dental  (+) Missing   Pulmonary shortness of breath and with exertion, asthma , sleep apnea , COPD,  COPD inhaler and oxygen dependent, Current Smoker,    Pulmonary exam normal        Cardiovascular Exercise Tolerance: Good hypertension, + CAD and +CHF  Normal cardiovascular exam+ dysrhythmias      Neuro/Psych  Headaches, PSYCHIATRIC DISORDERS  Neuromuscular disease    GI/Hepatic Neg liver ROS, PUD, GERD  Controlled,  Endo/Other  negative endocrine ROSdiabetes  Renal/GU negative Renal ROS  negative genitourinary   Musculoskeletal   Abdominal   Peds  Hematology negative hematology ROS (+)   Anesthesia Other Findings Past Medical History: 03/01/2018: Acute drug-induced gout of left foot     Comment:  Last Assessment & Plan:  Likely at least partially               brought on by diuresis.  Needs to continue to diurese                Will push hydration Stop allopurinol given initiation               during acute flare may worsen this, re-broach this when               asymptomatic Avoid nsaids given stomach pain Trial               colchicine Add acetaminophen Ice, elevate, rest No date: Allergy     Comment:  seasonal No date: Anxiety No date: Arthritis     Comment:  Right Knee No date: Asthma No date: CHF (congestive heart failure) (HCC) No date: COPD (chronic obstructive pulmonary disease) (HCC) No date: Coronary artery disease     Comment:  Leaky heart valve No date: Diabetes mellitus without complication (HCC) No date: Dysrhythmia No date: Fibromyalgia No date: GERD  (gastroesophageal reflux disease) No date: Hypertension No date: Pneumonia No date: PUD (peptic ulcer disease) No date: Pulmonary HTN (HCC) No date: Rheumatic fever/heart disease No date: Sleep apnea  Past Surgical History: No date: ADENOIDECTOMY 11/06/2019: COLONOSCOPY WITH PROPOFOL; N/A     Comment:  Procedure: COLONOSCOPY WITH PROPOFOL;  Surgeon:               Virgel Manifold, MD;  Location: [facility] ENDOSCOPY;                Service: Endoscopy;  Laterality: N/A;  2 day prep  No date: CORONARY ARTERY BYPASS GRAFT     Comment:  June 27 2018 05/25/2018: ESOPHAGOGASTRODUODENOSCOPY (EGD) WITH PROPOFOL; N/A     Comment:  Procedure: ESOPHAGOGASTRODUODENOSCOPY (EGD) WITH               PROPOFOL;  Surgeon: Lucilla Lame, MD;  Location: [facility]               ENDOSCOPY;  Service: Endoscopy;  Laterality: N/A; 11/06/2019: ESOPHAGOGASTRODUODENOSCOPY (EGD) WITH PROPOFOL; N/A     Comment:  Procedure: ESOPHAGOGASTRODUODENOSCOPY (EGD) WITH               PROPOFOL;  Surgeon: Virgel Manifold, MD;  Location:  [place] [facility]COPY;  Service: Endoscopy;  Laterality: N/A; No date: MITRAL VALVE REPLACEMENT No date: MULTIPLE TOOTH EXTRACTIONS No date: TONSILLECTOMY 09/04/2016: TOTAL KNEE ARTHROPLASTY; Right     Comment:  Procedure: TOTAL KNEE ARTHROPLASTY; with lateral               release;  Surgeon: Earlie Server, MD;  Location: [place];               Service: Orthopedics;  Laterality: Right;  BMI    Body Mass Index: 45.49 kg/m      Reproductive/Obstetrics negative OB ROS                             Anesthesia Physical Anesthesia Plan  ASA: 4  Anesthesia Plan: General   Post-op Pain Management:    Induction: Intravenous  PONV Risk Score and Plan: Propofol infusion and TIVA  Airway Management Planned: Natural Airway and Nasal Cannula  Additional Equipment:   Intra-op Plan:   Post-operative Plan:   Informed Consent: I have reviewed the patients History  and Physical, chart, labs and discussed the procedure including the risks, benefits and alternatives for the proposed anesthesia with the patient or authorized representative who has indicated his/her understanding and acceptance.     Dental Advisory Given  Plan Discussed with: Anesthesiologist, CRNA and Surgeon  Anesthesia Plan Comments: (Patient consented for risks of anesthesia including but not limited to:  - adverse reactions to medications - risk of airway placement if required - damage to eyes, teeth, lips or other oral mucosa - nerve damage due to positioning  - sore throat or hoarseness - Damage to heart, brain, nerves, lungs, other parts of body or loss of life  Patient voiced understanding.)        Anesthesia Quick Evaluation

## 2022-06-29 NOTE — H&P (Signed)
Cephas Darby, MD [address]  [place]  [place], [place] [address]  Main: [phone]  Fax: [phone] Pager: [phone]  Primary Care Physician:  Emelia Loron, NP Primary Gastroenterologist:  Dr. Cephas Darby  Pre-Procedure History & Physical: HPI:  Chelsea Ramsey is a 56 y.o. female is here for an endoscopy and colonoscopy.   Past Medical History:  Diagnosis Date   Acute drug-induced gout of left foot 03/01/2018   Last Assessment & Plan:  Likely at least partially brought on by diuresis.  Needs to continue to diurese  Will push hydration Stop allopurinol given initiation during acute flare may worsen this, re-broach this when asymptomatic Avoid nsaids given stomach pain Trial colchicine Add acetaminophen Ice, elevate, rest   Allergy    seasonal   Anxiety    Arthritis    Right Knee   Asthma    CHF (congestive heart failure) (HCC)    COPD (chronic obstructive pulmonary disease) (HCC)    Coronary artery disease    Leaky heart valve   Diabetes mellitus without complication (HCC)    Dysrhythmia    Fibromyalgia    GERD (gastroesophageal reflux disease)    Hypertension    Pneumonia    PUD (peptic ulcer disease)    Pulmonary HTN (HCC)    Rheumatic fever/heart disease    Sleep apnea     Past Surgical History:  Procedure Laterality Date   ADENOIDECTOMY     COLONOSCOPY WITH PROPOFOL N/A 11/06/2019   Procedure: COLONOSCOPY WITH PROPOFOL;  Surgeon: Virgel Manifold, MD;  Location: [facility] ENDOSCOPY;  Service: Endoscopy;  Laterality: N/A;  2 day prep    CORONARY ARTERY BYPASS GRAFT     June 27 2018   ESOPHAGOGASTRODUODENOSCOPY (EGD) WITH PROPOFOL N/A 05/25/2018   Procedure: ESOPHAGOGASTRODUODENOSCOPY (EGD) WITH PROPOFOL;  Surgeon: Lucilla Lame, MD;  Location: [facility] ENDOSCOPY;  Service: Endoscopy;  Laterality: N/A;   ESOPHAGOGASTRODUODENOSCOPY (EGD) WITH PROPOFOL N/A 11/06/2019   Procedure: ESOPHAGOGASTRODUODENOSCOPY (EGD) WITH PROPOFOL;  Surgeon:  Virgel Manifold, MD;  Location: [facility] ENDOSCOPY;  Service: Endoscopy;  Laterality: N/A;   MITRAL VALVE REPLACEMENT     MULTIPLE TOOTH EXTRACTIONS     TONSILLECTOMY     TOTAL KNEE ARTHROPLASTY Right 09/04/2016   Procedure: TOTAL KNEE ARTHROPLASTY; with lateral release;  Surgeon: Earlie Server, MD;  Location: [facility];  Service: Orthopedics;  Laterality: Right;    Prior to Admission medications   Medication Sig Start Date End Date Taking? Authorizing Provider  albuterol (VENTOLIN HFA) 108 (90 Base) MCG/ACT inhaler Inhale 1-2 puffs into the lungs every 6 (six) hours as needed for wheezing or shortness of breath.   Yes [provider]  allopurinol (ZYLOPRIM) 100 MG tablet Take 1 tablet (100 mg total) by mouth 2 (two) times daily. 03/05/20  Yes Milinda Pointer, MD  Cholecalciferol (D 5000) 125 MCG (5000 UT) capsule Take by mouth daily. 11/14/18  Yes [provider]  Cholecalciferol (VITAMIN D) 50 MCG (2000 UT) CAPS Take 1 capsule (2,000 Units total) by mouth daily. 06/19/21  Yes Darylene Price A, FNP  colchicine 0.6 MG tablet Take 1 tablet (0.6 mg total) by mouth daily. As directed 03/05/20  Yes Milinda Pointer, MD  docusate sodium (COLACE) 100 MG capsule Take 100 mg by mouth 2 (two) times daily.   Yes [provider]  empagliflozin (JARDIANCE) 10 MG TABS tablet Take 10 mg by mouth daily.   Yes [provider]  Fluticasone-Umeclidin-Vilant (TRELEGY ELLIPTA) 100-62.5-25 MCG/INH AEPB Inhale 1 puff  into the lungs daily.   Yes [provider]  furosemide (LASIX) 40 MG tablet TAKE 2 TABLETS BY MOUTH IN THE MORNING AND 1 TABLET AT NIGHT Patient taking differently: 40 mg 2 (two) times daily. 04/23/20  Yes Darylene Price A, FNP  gabapentin (NEURONTIN) 600 MG tablet Take 0.5 tablets (300 mg total) by mouth 2 (two) times daily. Patient taking differently: Take 600 mg by mouth 3 (three) times daily. 12/06/18  Yes Vaughan Basta, MD  ipratropium-albuterol  (DUONEB) 0.5-2.5 (3) MG/3ML SOLN INHALE THE CONTENTS OF 1 VIAL(3MLS) VIA NEBULIZER 3 TIMES DAILY AS NEEDED 10/24/19  Yes Pollak, Adriana M, PA-C  Iron-FA-B Cmp-C-Biot-Probiotic (FUSION PLUS) CAPS Take 1 capsule by mouth daily. 06/25/22  Yes Lin Landsman, MD  meclizine (ANTIVERT) 12.5 MG tablet TAKE 2 TABLETS (25 MG TOTAL) BY MOUTH 3 TIMES DAILY AS NEEDED FOR DIZZINESS 11/17/19  Yes Carles Collet M, PA-C  metolazone (ZAROXOLYN) 2.5 MG tablet Take 2.5 mg by mouth. Once a week; additional dose if needed   Yes [provider]  metoprolol tartrate (LOPRESSOR) 100 MG tablet Take 1 tablet (100 mg total) by mouth 2 (two) times daily. 12/21/18  Yes Hackney, Otila Kluver A, FNP  nitroGLYCERIN (NITROSTAT) 0.4 MG SL tablet Place 1 tablet (0.4 mg total) under the tongue every 5 (five) minutes as needed for chest pain. 06/19/21  Yes Hackney, Otila Kluver A, FNP  nortriptyline (PAMELOR) 10 MG capsule Take 20 mg by mouth at bedtime.    Yes [provider]  omeprazole (PRILOSEC) 20 MG capsule TAKE 2 CAPSULES BY MOUTH TWICE A DAY BEFORE A MEAL 03/06/20  Yes Carles Collet M, PA-C  oxyCODONE-acetaminophen (PERCOCET/ROXICET) 5-325 MG tablet Take 1 tablet by mouth every 4 (four) hours as needed for severe pain. 11/03/20  Yes Paulette Blanch, MD  OXYGEN Inhale 4 L/L into the lungs.   Yes [provider]  potassium chloride (KLOR-CON) 10 MEQ tablet Take 4 tablets (40 mEq total) by mouth 2 (two) times daily. And additional 55mq on metolazone days 01/05/22  Yes Hackney, TAura Fey FNP  promethazine (PHENERGAN) 12.5 MG tablet Take 1 tablet (12.5 mg total) by mouth every 8 (eight) hours as needed for nausea or vomiting. 02/20/20  Yes Bacigalupo, ADionne Bucy MD  rivaroxaban (XARELTO) 20 MG TABS tablet Take 20 mg by mouth daily with supper.   Yes [provider]  zolpidem (AMBIEN) 5 MG tablet Take 5 mg by mouth at bedtime as needed for sleep.   Yes [provider]  butalbital-acetaminophen-caffeine  (FIORICET, ESGIC) 50-325-40 MG tablet Take 1 tablet by mouth every 6 (six) hours as needed.  Patient not taking: Reported on 12/18/2021    [provider]    Allergies as of 06/24/2022 - Review Complete 12/18/2021  Allergen Reaction Noted   Amiodarone Nausea And Vomiting 07/17/2018   Aspirin Swelling 06/08/2015   Flexeril [cyclobenzaprine] Swelling 07/05/2015   Trazamine [trazodone & diet manage prod] Nausea And Vomiting 08/19/2016   Codeine Rash 06/08/2015   Tramadol Rash 06/08/2015    Family History  Problem Relation Age of Onset   COPD Mother    Cancer Mother        Bone   Asthma Mother    Rheum arthritis Mother    Congestive Heart Failure Father    Breast cancer Maternal Aunt     Social History   Socioeconomic History   Marital status: Married    Spouse name: Not on file   Number of children: Not on file  Years of education: Not on file   Highest education level: Not on file  Occupational History   Occupation: disabled  Tobacco Use   Smoking status: Every Day    Packs/day: 0.10    Types: Cigarettes   Smokeless tobacco: Never   Tobacco comments:    4/20 was not able to quit.  Still at 1-3 a day. She would like to wait to set a new quit date until we get back to class to have the extra in person support  Vaping Use   Vaping Use: Never used  Substance and Sexual Activity   Alcohol use: Never    Alcohol/week: 3.0 standard drinks of alcohol    Types: 3 Cans of beer per week    Comment: 16 oz per week   Drug use: Not Currently    Comment: Previous use of cocaine and marijuana last use 07/31/16    Sexual activity: Not Currently  Other Topics Concern   Not on file  Social History Narrative   Not on file   Social Determinants of Health   Financial Resource Strain: Low Risk  (10/02/2019)   Overall Financial Resource Strain (CARDIA)    Difficulty of Paying Living Expenses: Not hard at all  Food Insecurity: No Food Insecurity (03/19/2020)   Hunger Vital  Sign    Worried About Running Out of Food in the Last Year: Never true    Ran Out of Food in the Last Year: Never true  Transportation Needs: Unmet Transportation Needs (03/19/2020)   PRAPARE - Transportation    Lack of Transportation (Medical): No    Lack of Transportation (Non-Medical): Yes  Physical Activity: Insufficiently Active (10/02/2019)   Exercise Vital Sign    Days of Exercise per Week: 7 days    Minutes of Exercise per Session: 20 min  Stress: No Stress Concern Present (10/02/2019)   [employer]    Feeling of Stress : Not at all  Social Connections: Moderately Integrated (10/02/2019)   Social Connection and Isolation Panel [NHANES]    Frequency of Communication with Friends and Family: More than three times a week    Frequency of Social Gatherings with Friends and Family: More than three times a week    Attends Religious Services: 1 to 4 times per year    Active Member of [employer] or Organizations: Yes    Attends Archivist Meetings: 1 to 4 times per year    Marital Status: Never married  Intimate Partner Violence: Not At Risk (10/02/2019)   Humiliation, Afraid, Rape, and Kick questionnaire    Fear of Current or Ex-Partner: No    Emotionally Abused: No    Physically Abused: No    Sexually Abused: No    Review of Systems: See HPI, otherwise negative ROS  Physical Exam: BP (!) 129/93   Pulse 80   Temp (!) 97.3 F (36.3 C) (Temporal)   Resp 20   Ht '5\' 4"'$  (1.626 m)   Wt 120.2 kg   LMP 11/08/2009 Comment: menopause  SpO2 100%   BMI 45.49 kg/m  General:   Alert,  pleasant and cooperative in NAD Head:  Normocephalic and atraumatic. Neck:  Supple; no masses or thyromegaly. Lungs:  Clear throughout to auscultation.    Heart:  Regular rate and rhythm. Abdomen:  Soft, nontender and nondistended. Normal bowel sounds, without guarding, and without rebound.   Neurologic:  Alert and  oriented x4;   grossly normal neurologically.  Impression/Plan: Chelsea Ramsey is here for an endoscopy and colonoscopy to be performed for IDA  Risks, benefits, limitations, and alternatives regarding  endoscopy and colonoscopy have been reviewed with the patient.  Questions have been answered.  All parties agreeable.   Sherri Sear, MD  06/29/2022, 7:49 AM

## 2022-06-29 NOTE — Transfer of Care (Signed)
Immediate Anesthesia Transfer of Care Note  Patient: Chelsea Ramsey  Procedure(s) Performed: COLONOSCOPY WITH PROPOFOL ESOPHAGOGASTRODUODENOSCOPY (EGD) WITH PROPOFOL  Patient Location: PACU  Anesthesia Type:General  Level of Consciousness: awake and alert   Airway & Oxygen Therapy: Patient Spontanous Breathing, Patient connected to nasal cannula oxygen and Patient connected to face mask oxygen  Post-op Assessment: Report given to RN and Post -op Vital signs reviewed and stable  Post vital signs: Reviewed and stable  Last Vitals:  Vitals Value Taken Time  BP 103/86 06/29/22 0937  Temp 36 C 06/29/22 0934  Pulse 84 06/29/22 0937  Resp 24 06/29/22 0937  SpO2 100 % 06/29/22 0937  Vitals shown include unvalidated device data.  Last Pain:  Vitals:   06/29/22 0934  TempSrc: Tympanic  PainSc: Asleep         Complications: No notable events documented.

## 2022-06-30 ENCOUNTER — Encounter: Payer: Self-pay | Admitting: Gastroenterology

## 2022-07-01 ENCOUNTER — Telehealth: Payer: Self-pay

## 2022-07-01 ENCOUNTER — Other Ambulatory Visit: Payer: Self-pay

## 2022-07-01 DIAGNOSIS — D509 Iron deficiency anemia, unspecified: Secondary | ICD-10-CM

## 2022-07-01 LAB — SURGICAL PATHOLOGY

## 2022-07-01 NOTE — Telephone Encounter (Signed)
-----   Message from Lin Landsman, MD sent at 07/01/2022 10:41 AM EDT ----- The pathology results from upper endoscopy came back normal.  I recommend video capsule endoscopy If patient is agreeable  RV

## 2022-07-01 NOTE — Telephone Encounter (Signed)
Patient verbalized understanding of instructions. Schedule capsule study for 07/22/2022 and went over instructions. Mailed them to patient also

## 2022-07-21 ENCOUNTER — Telehealth: Payer: Self-pay

## 2022-07-21 NOTE — Telephone Encounter (Signed)
Patient states pharmacy states the anti gas drops only come in kids and not adults. Informed patient that the kids version was okay to buy it was the same thing

## 2022-07-22 ENCOUNTER — Encounter: Payer: Self-pay | Admitting: Certified Registered"

## 2022-07-22 ENCOUNTER — Encounter
Admission: RE | Disposition: A | Discharge status: Home / Self Care | Payer: Self-pay | Source: Home / Self Care | Attending: Gastroenterology

## 2022-07-22 ENCOUNTER — Ambulatory Visit
Admission: RE | Admit: 2022-07-22 | Discharge: 2022-07-22 | Disposition: A | Discharge status: Home / Self Care | Payer: Medicaid Other | Attending: Gastroenterology | Admitting: Gastroenterology

## 2022-07-22 DIAGNOSIS — D509 Iron deficiency anemia, unspecified: Secondary | ICD-10-CM

## 2022-07-22 HISTORY — PX: GIVENS CAPSULE STUDY: SHX5432

## 2022-07-22 SURGERY — IMAGING PROCEDURE, GI TRACT, INTRALUMINAL, VIA CAPSULE

## 2022-07-22 MED ORDER — PHENYLEPHRINE HCL (PRESSORS) 10 MG/ML IV SOLN
INTRAVENOUS | Status: AC
  Filled 2022-07-22: qty 1

## 2022-07-22 MED ORDER — PROPOFOL 10 MG/ML IV BOLUS
INTRAVENOUS | Status: AC
  Filled 2022-07-22: qty 20

## 2022-07-22 MED ORDER — PROPOFOL 1000 MG/100ML IV EMUL
INTRAVENOUS | Status: AC
  Filled 2022-07-22: qty 100

## 2022-07-23 ENCOUNTER — Encounter: Payer: Self-pay | Admitting: Gastroenterology

## 2022-08-07 DIAGNOSIS — D509 Iron deficiency anemia, unspecified: Secondary | ICD-10-CM

## 2022-08-11 ENCOUNTER — Telehealth: Payer: Self-pay

## 2022-08-11 DIAGNOSIS — D509 Iron deficiency anemia, unspecified: Secondary | ICD-10-CM

## 2022-08-11 NOTE — Telephone Encounter (Signed)
Informed patient and she will get blood work done and try to go to the ER

## 2022-08-11 NOTE — Telephone Encounter (Signed)
I agree, she needs to go to the ER if she is concerned that her symptoms are similar to previous stroke. Will follow up on her blood work results  RV

## 2022-08-11 NOTE — Telephone Encounter (Signed)
-----   Message from Lin Landsman, MD sent at 08/07/2022 12:06 PM EDT ----- Regarding: CApsule results Please inform patient Chelsea Ramsey results came back normal, source of IDA not identified. Recheck CBC, iron panel and follow up with me as scheduled Continue fusion plus  RV

## 2022-08-11 NOTE — Telephone Encounter (Signed)
Patient returned call and verbalized understanding of results and she states she will go for lab work. Patient states since her colonoscopy and EGD. She has not been able to hardly get out of bed. She states the only time she gets out of bed is to go to a appointment or go to the bathroom. She state she is so weak, and has not been able to eat any foods. She states she is drink water and juice. She states her legs are weak and is having abdominal pain. Denies any constipation or diarrhea. She states she is having some SOB and she states it feels like when she had a stoke several years ago. Advised patient she needed to go to the ER to get seen today. She states she will try if she can find a ride. Informed patient I could call 911 for her to go by ambulance. Patient declined and states she will find a ride to take her today or tomorrow

## 2022-08-11 NOTE — Telephone Encounter (Signed)
Order labs. Called patient and left a message for call back  

## 2022-09-15 ENCOUNTER — Other Ambulatory Visit: Payer: Self-pay

## 2022-09-15 ENCOUNTER — Emergency Department: Payer: Medicaid Other

## 2022-09-15 ENCOUNTER — Inpatient Hospital Stay
Admission: EM | Admit: 2022-09-15 | Discharge: 2022-10-01 | DRG: 291 | Disposition: A | Discharge status: Home Health | Payer: Medicaid Other | Attending: Internal Medicine | Admitting: Internal Medicine

## 2022-09-15 DIAGNOSIS — I081 Rheumatic disorders of both mitral and tricuspid valves: Secondary | ICD-10-CM | POA: Diagnosis present

## 2022-09-15 DIAGNOSIS — D72829 Elevated white blood cell count, unspecified: Secondary | ICD-10-CM | POA: Diagnosis not present

## 2022-09-15 DIAGNOSIS — Z951 Presence of aortocoronary bypass graft: Secondary | ICD-10-CM

## 2022-09-15 DIAGNOSIS — G894 Chronic pain syndrome: Secondary | ICD-10-CM | POA: Diagnosis present

## 2022-09-15 DIAGNOSIS — R7989 Other specified abnormal findings of blood chemistry: Secondary | ICD-10-CM | POA: Diagnosis present

## 2022-09-15 DIAGNOSIS — K831 Obstruction of bile duct: Secondary | ICD-10-CM | POA: Diagnosis present

## 2022-09-15 DIAGNOSIS — Z79899 Other long term (current) drug therapy: Secondary | ICD-10-CM

## 2022-09-15 DIAGNOSIS — K7581 Nonalcoholic steatohepatitis (NASH): Secondary | ICD-10-CM | POA: Diagnosis present

## 2022-09-15 DIAGNOSIS — Z8249 Family history of ischemic heart disease and other diseases of the circulatory system: Secondary | ICD-10-CM

## 2022-09-15 DIAGNOSIS — I5043 Acute on chronic combined systolic (congestive) and diastolic (congestive) heart failure: Secondary | ICD-10-CM | POA: Diagnosis present

## 2022-09-15 DIAGNOSIS — I890 Lymphedema, not elsewhere classified: Secondary | ICD-10-CM | POA: Diagnosis present

## 2022-09-15 DIAGNOSIS — J9612 Chronic respiratory failure with hypercapnia: Secondary | ICD-10-CM | POA: Diagnosis present

## 2022-09-15 DIAGNOSIS — R7612 Nonspecific reaction to cell mediated immunity measurement of gamma interferon antigen response without active tuberculosis: Secondary | ICD-10-CM

## 2022-09-15 DIAGNOSIS — Z66 Do not resuscitate: Secondary | ICD-10-CM | POA: Diagnosis present

## 2022-09-15 DIAGNOSIS — F419 Anxiety disorder, unspecified: Secondary | ICD-10-CM | POA: Diagnosis present

## 2022-09-15 DIAGNOSIS — I5023 Acute on chronic systolic (congestive) heart failure: Secondary | ICD-10-CM | POA: Diagnosis present

## 2022-09-15 DIAGNOSIS — R9431 Abnormal electrocardiogram [ECG] [EKG]: Secondary | ICD-10-CM | POA: Diagnosis present

## 2022-09-15 DIAGNOSIS — I272 Pulmonary hypertension, unspecified: Secondary | ICD-10-CM | POA: Diagnosis present

## 2022-09-15 DIAGNOSIS — F1491 Cocaine use, unspecified, in remission: Secondary | ICD-10-CM | POA: Diagnosis present

## 2022-09-15 DIAGNOSIS — T402X5A Adverse effect of other opioids, initial encounter: Secondary | ICD-10-CM | POA: Diagnosis not present

## 2022-09-15 DIAGNOSIS — K810 Acute cholecystitis: Secondary | ICD-10-CM | POA: Diagnosis present

## 2022-09-15 DIAGNOSIS — D509 Iron deficiency anemia, unspecified: Secondary | ICD-10-CM | POA: Diagnosis present

## 2022-09-15 DIAGNOSIS — K219 Gastro-esophageal reflux disease without esophagitis: Secondary | ICD-10-CM | POA: Diagnosis present

## 2022-09-15 DIAGNOSIS — E860 Dehydration: Secondary | ICD-10-CM | POA: Diagnosis present

## 2022-09-15 DIAGNOSIS — Z888 Allergy status to other drugs, medicaments and biological substances status: Secondary | ICD-10-CM

## 2022-09-15 DIAGNOSIS — K828 Other specified diseases of gallbladder: Secondary | ICD-10-CM | POA: Diagnosis present

## 2022-09-15 DIAGNOSIS — E669 Obesity, unspecified: Secondary | ICD-10-CM

## 2022-09-15 DIAGNOSIS — F17200 Nicotine dependence, unspecified, uncomplicated: Secondary | ICD-10-CM | POA: Diagnosis present

## 2022-09-15 DIAGNOSIS — J440 Chronic obstructive pulmonary disease with acute lower respiratory infection: Secondary | ICD-10-CM | POA: Diagnosis present

## 2022-09-15 DIAGNOSIS — Z6841 Body Mass Index (BMI) 40.0 and over, adult: Secondary | ICD-10-CM

## 2022-09-15 DIAGNOSIS — I4892 Unspecified atrial flutter: Secondary | ICD-10-CM | POA: Diagnosis present

## 2022-09-15 DIAGNOSIS — I2729 Other secondary pulmonary hypertension: Secondary | ICD-10-CM | POA: Diagnosis present

## 2022-09-15 DIAGNOSIS — Z803 Family history of malignant neoplasm of breast: Secondary | ICD-10-CM

## 2022-09-15 DIAGNOSIS — Z953 Presence of xenogenic heart valve: Secondary | ICD-10-CM

## 2022-09-15 DIAGNOSIS — R042 Hemoptysis: Secondary | ICD-10-CM | POA: Diagnosis not present

## 2022-09-15 DIAGNOSIS — Z8261 Family history of arthritis: Secondary | ICD-10-CM

## 2022-09-15 DIAGNOSIS — Z7901 Long term (current) use of anticoagulants: Secondary | ICD-10-CM

## 2022-09-15 DIAGNOSIS — Z8673 Personal history of transient ischemic attack (TIA), and cerebral infarction without residual deficits: Secondary | ICD-10-CM

## 2022-09-15 DIAGNOSIS — G9341 Metabolic encephalopathy: Secondary | ICD-10-CM

## 2022-09-15 DIAGNOSIS — I5032 Chronic diastolic (congestive) heart failure: Secondary | ICD-10-CM | POA: Diagnosis present

## 2022-09-15 DIAGNOSIS — R1011 Right upper quadrant pain: Secondary | ICD-10-CM

## 2022-09-15 DIAGNOSIS — K766 Portal hypertension: Secondary | ICD-10-CM | POA: Diagnosis present

## 2022-09-15 DIAGNOSIS — Z91148 Patient's other noncompliance with medication regimen for other reason: Secondary | ICD-10-CM

## 2022-09-15 DIAGNOSIS — M5440 Lumbago with sciatica, unspecified side: Secondary | ICD-10-CM | POA: Diagnosis present

## 2022-09-15 DIAGNOSIS — R040 Epistaxis: Secondary | ICD-10-CM | POA: Diagnosis not present

## 2022-09-15 DIAGNOSIS — I959 Hypotension, unspecified: Secondary | ICD-10-CM | POA: Diagnosis present

## 2022-09-15 DIAGNOSIS — E1122 Type 2 diabetes mellitus with diabetic chronic kidney disease: Secondary | ICD-10-CM | POA: Diagnosis present

## 2022-09-15 DIAGNOSIS — E876 Hypokalemia: Secondary | ICD-10-CM

## 2022-09-15 DIAGNOSIS — Y92239 Unspecified place in hospital as the place of occurrence of the external cause: Secondary | ICD-10-CM | POA: Diagnosis not present

## 2022-09-15 DIAGNOSIS — J9611 Chronic respiratory failure with hypoxia: Secondary | ICD-10-CM | POA: Diagnosis present

## 2022-09-15 DIAGNOSIS — M797 Fibromyalgia: Secondary | ICD-10-CM | POA: Diagnosis present

## 2022-09-15 DIAGNOSIS — R000 Tachycardia, unspecified: Secondary | ICD-10-CM | POA: Diagnosis present

## 2022-09-15 DIAGNOSIS — Z96651 Presence of right artificial knee joint: Secondary | ICD-10-CM | POA: Diagnosis present

## 2022-09-15 DIAGNOSIS — N179 Acute kidney failure, unspecified: Secondary | ICD-10-CM

## 2022-09-15 DIAGNOSIS — K7682 Hepatic encephalopathy: Secondary | ICD-10-CM | POA: Diagnosis present

## 2022-09-15 DIAGNOSIS — K7200 Acute and subacute hepatic failure without coma: Secondary | ICD-10-CM | POA: Diagnosis present

## 2022-09-15 DIAGNOSIS — I130 Hypertensive heart and chronic kidney disease with heart failure and stage 1 through stage 4 chronic kidney disease, or unspecified chronic kidney disease: Principal | ICD-10-CM | POA: Diagnosis present

## 2022-09-15 DIAGNOSIS — K761 Chronic passive congestion of liver: Secondary | ICD-10-CM | POA: Diagnosis present

## 2022-09-15 DIAGNOSIS — J189 Pneumonia, unspecified organism: Secondary | ICD-10-CM | POA: Diagnosis present

## 2022-09-15 DIAGNOSIS — D689 Coagulation defect, unspecified: Secondary | ICD-10-CM | POA: Diagnosis present

## 2022-09-15 DIAGNOSIS — Z515 Encounter for palliative care: Secondary | ICD-10-CM

## 2022-09-15 DIAGNOSIS — G4733 Obstructive sleep apnea (adult) (pediatric): Secondary | ICD-10-CM | POA: Diagnosis present

## 2022-09-15 DIAGNOSIS — F17210 Nicotine dependence, cigarettes, uncomplicated: Secondary | ICD-10-CM | POA: Diagnosis present

## 2022-09-15 DIAGNOSIS — I4891 Unspecified atrial fibrillation: Secondary | ICD-10-CM | POA: Diagnosis present

## 2022-09-15 DIAGNOSIS — N182 Chronic kidney disease, stage 2 (mild): Secondary | ICD-10-CM | POA: Diagnosis present

## 2022-09-15 DIAGNOSIS — Z825 Family history of asthma and other chronic lower respiratory diseases: Secondary | ICD-10-CM

## 2022-09-15 DIAGNOSIS — K529 Noninfective gastroenteritis and colitis, unspecified: Secondary | ICD-10-CM | POA: Diagnosis present

## 2022-09-15 DIAGNOSIS — I2510 Atherosclerotic heart disease of native coronary artery without angina pectoris: Secondary | ICD-10-CM | POA: Diagnosis present

## 2022-09-15 DIAGNOSIS — Z9981 Dependence on supplemental oxygen: Secondary | ICD-10-CM

## 2022-09-15 DIAGNOSIS — R109 Unspecified abdominal pain: Secondary | ICD-10-CM | POA: Diagnosis present

## 2022-09-15 DIAGNOSIS — G8929 Other chronic pain: Secondary | ICD-10-CM | POA: Diagnosis present

## 2022-09-15 DIAGNOSIS — I480 Paroxysmal atrial fibrillation: Secondary | ICD-10-CM | POA: Diagnosis present

## 2022-09-15 DIAGNOSIS — I5082 Biventricular heart failure: Secondary | ICD-10-CM | POA: Diagnosis present

## 2022-09-15 DIAGNOSIS — I10 Essential (primary) hypertension: Secondary | ICD-10-CM | POA: Diagnosis present

## 2022-09-15 DIAGNOSIS — F32A Depression, unspecified: Secondary | ICD-10-CM | POA: Diagnosis present

## 2022-09-15 DIAGNOSIS — Z885 Allergy status to narcotic agent status: Secondary | ICD-10-CM

## 2022-09-15 LAB — CBC WITH DIFFERENTIAL/PLATELET
Abs Immature Granulocytes: 0.02 10*3/uL (ref 0.00–0.07)
Basophils Absolute: 0 10*3/uL (ref 0.0–0.1)
Basophils Relative: 0 %
Eosinophils Absolute: 0 10*3/uL (ref 0.0–0.5)
Eosinophils Relative: 1 %
HCT: 35.6 % — ABNORMAL LOW (ref 36.0–46.0)
Hemoglobin: 9.9 g/dL — ABNORMAL LOW (ref 12.0–15.0)
Immature Granulocytes: 0 %
Lymphocytes Relative: 18 %
Lymphs Abs: 1.2 10*3/uL (ref 0.7–4.0)
MCH: 21.8 pg — ABNORMAL LOW (ref 26.0–34.0)
MCHC: 27.8 g/dL — ABNORMAL LOW (ref 30.0–36.0)
MCV: 78.4 fL — ABNORMAL LOW (ref 80.0–100.0)
Monocytes Absolute: 0.5 10*3/uL (ref 0.1–1.0)
Monocytes Relative: 8 %
Neutro Abs: 4.9 10*3/uL (ref 1.7–7.7)
Neutrophils Relative %: 73 %
Platelets: 269 10*3/uL (ref 150–400)
RBC: 4.54 MIL/uL (ref 3.87–5.11)
RDW: 24 % — ABNORMAL HIGH (ref 11.5–15.5)
WBC: 6.7 10*3/uL (ref 4.0–10.5)
nRBC: 0.9 % — ABNORMAL HIGH (ref 0.0–0.2)

## 2022-09-15 LAB — COMPREHENSIVE METABOLIC PANEL
ALT: 35 U/L (ref 0–44)
AST: 57 U/L — ABNORMAL HIGH (ref 15–41)
Albumin: 3 g/dL — ABNORMAL LOW (ref 3.5–5.0)
Alkaline Phosphatase: 80 U/L (ref 38–126)
Anion gap: 17 — ABNORMAL HIGH (ref 5–15)
BUN: 47 mg/dL — ABNORMAL HIGH (ref 6–20)
CO2: 27 mmol/L (ref 22–32)
Calcium: 9 mg/dL (ref 8.9–10.3)
Chloride: 93 mmol/L — ABNORMAL LOW (ref 98–111)
Creatinine, Ser: 3.54 mg/dL — ABNORMAL HIGH (ref 0.44–1.00)
GFR, Estimated: 15 mL/min — ABNORMAL LOW (ref >60)
Glucose, Bld: 104 mg/dL — ABNORMAL HIGH (ref 70–99)
Potassium: 3.7 mmol/L (ref 3.5–5.1)
Sodium: 137 mmol/L (ref 135–145)
Total Bilirubin: 9.6 mg/dL — ABNORMAL HIGH (ref 0.3–1.2)
Total Protein: 7.4 g/dL (ref 6.5–8.1)

## 2022-09-15 LAB — LIPASE, BLOOD: Lipase: 46 U/L (ref 11–51)

## 2022-09-15 MED ORDER — FENTANYL CITRATE PF 50 MCG/ML IJ SOSY
50.0000 ug | PREFILLED_SYRINGE | Freq: Once | INTRAMUSCULAR | Status: AC
  Administered 2022-09-15: 50 ug via INTRAVENOUS
  Filled 2022-09-15: qty 1

## 2022-09-15 MED ORDER — SODIUM CHLORIDE 0.9 % IV BOLUS
500.0000 mL | Freq: Once | INTRAVENOUS | Status: AC
  Administered 2022-09-15: 500 mL via INTRAVENOUS

## 2022-09-15 NOTE — H&P (Incomplete)
History and Physical    Chief Complaint: AKI   HISTORY OF PRESENT ILLNESS: Chelsea Ramsey is an 56 y.o. female  Seen by pcp today for not feeling well and abd pain for about a month per patient.  Her pcp called and advised she come to ed for abnormal labs.  Pt states she has nausea and pain has been getting worse  and shows the RUQ / Rt flank/ Rt side of abdomen involved.  Pt otherwise does not report any complaints. Continues to smoke.   Pt has PMH as below: Past Medical History:  Diagnosis Date  . Acute drug-induced gout of left foot 03/01/2018   Last Assessment & Plan:  Likely at least partially brought on by diuresis.  Needs to continue to diurese  Will push hydration Stop allopurinol given initiation during acute flare may worsen this, re-broach this when asymptomatic Avoid nsaids given stomach pain Trial colchicine Add acetaminophen Ice, elevate, rest  . Allergy    seasonal  . Anxiety   . Arthritis    Right Knee  . Asthma   . CHF (congestive heart failure) ([place])   . COPD (chronic obstructive pulmonary disease) ([place])   . Coronary artery disease    Leaky heart valve  . Diabetes mellitus without complication ([place])   . Dysrhythmia   . Fibromyalgia   . GERD (gastroesophageal reflux disease)   . Hypertension   . Pneumonia   . PUD (peptic ulcer disease)   . Pulmonary HTN ([place])   . Rheumatic fever/heart disease   . Sleep apnea      Review of Systems  Cardiovascular:  Positive for leg swelling.  Gastrointestinal:  Positive for abdominal pain and nausea.  All other systems reviewed and are negative.     Allergies  Allergen Reactions  . Amiodarone Nausea And Vomiting  . Aspirin Swelling  . Flexeril [Cyclobenzaprine] Swelling  . Trazamine [Trazodone & Diet Manage Prod] Nausea And Vomiting  . Codeine Rash  . Tramadol Rash     Past Surgical History:  Procedure Laterality Date  . ADENOIDECTOMY    . COLONOSCOPY WITH PROPOFOL N/A 11/06/2019    Procedure: COLONOSCOPY WITH PROPOFOL;  Surgeon: Virgel Manifold, MD;  Location: [facility] ENDOSCOPY;  Service: Endoscopy;  Laterality: N/A;  2 day prep   . COLONOSCOPY WITH PROPOFOL N/A 06/29/2022   Procedure: COLONOSCOPY WITH PROPOFOL;  Surgeon: Lin Landsman, MD;  Location: [facility] ENDOSCOPY;  Service: Gastroenterology;  Laterality: N/A;  . CORONARY ARTERY BYPASS GRAFT     June 27 2018  . ESOPHAGOGASTRODUODENOSCOPY (EGD) WITH PROPOFOL N/A 05/25/2018   Procedure: ESOPHAGOGASTRODUODENOSCOPY (EGD) WITH PROPOFOL;  Surgeon: Lucilla Lame, MD;  Location: [facility] ENDOSCOPY;  Service: Endoscopy;  Laterality: N/A;  . ESOPHAGOGASTRODUODENOSCOPY (EGD) WITH PROPOFOL N/A 11/06/2019   Procedure: ESOPHAGOGASTRODUODENOSCOPY (EGD) WITH PROPOFOL;  Surgeon: Virgel Manifold, MD;  Location: [facility] ENDOSCOPY;  Service: Endoscopy;  Laterality: N/A;  . ESOPHAGOGASTRODUODENOSCOPY (EGD) WITH PROPOFOL N/A 06/29/2022   Procedure: ESOPHAGOGASTRODUODENOSCOPY (EGD) WITH PROPOFOL;  Surgeon: Lin Landsman, MD;  Location: [facility] ENDOSCOPY;  Service: Gastroenterology;  Laterality: N/A;  . GIVENS CAPSULE STUDY N/A 07/22/2022   Procedure: GIVENS CAPSULE STUDY;  Surgeon: Lin Landsman, MD;  Location[facility] ENDOSCOPY;  Service: Gastroenterology;  Laterality: N/A;  . MITRAL VALVE REPLACEMENT    . MULTIPLE TOOTH EXTRACTIONS    . TONSILLECTOMY    . TOTAL KNEE ARTHROPLASTY Right 09/04/2016   Procedure: TOTAL KNEE ARTHROPLASTY; with lateral release;  Surgeon: Earlie Server, MD;  Location: [place];  Service: Orthopedics;  Laterality: Right;      Social History   Socioeconomic History  . Marital status: Married    Spouse name: Not on file  . Number of children: Not on file  . Years of education: Not on file  . Highest education level: Not on file  Occupational History  . Occupation: disabled  Tobacco Use  . Smoking status: Every Day    Packs/day: 0.10    Types: Cigarettes  . Smokeless tobacco: Never  . Tobacco comments:     4/20 was not able to quit.  Still at 1-3 a day. She would like to wait to set a new quit date until we get back to class to have the extra in person support  Vaping Use  . Vaping Use: Never used  Substance and Sexual Activity  . Alcohol use: Never    Alcohol/week: 3.0 standard drinks of alcohol    Types: 3 Cans of beer per week    Comment: 16 oz per week  . 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 Sexual activity: Not Currently  Other Topics Concern  . Not on file  Social History Narrative  . Not on file   Social Determinants of Health   Financial Resource Strain: Low Risk  (10/02/2019)   Overall Financial Resource Strain (CARDIA)   . Difficulty of Paying Living Expenses: Not hard at all  Food Insecurity: No Food Insecurity (03/19/2020)   Hunger Vital Sign   . Worried About Charity fundraiser in the Last Year: Never true   . Ran Out of Food in the Last Year: Never true  Transportation Needs: Unmet Transportation Needs (03/19/2020)   PRAPARE - Transportation   . Lack of Transportation (Medical): No   . Lack of Transportation (Non-Medical): Yes  Physical Activity: Insufficiently Active (10/02/2019)   Exercise Vital Sign   . Days of Exercise per Week: 7 days   . Minutes of Exercise per Session: 20 min  Stress: No Stress Concern Present (10/02/2019)   Friendly   . Feeling of Stress : Not at all  Social Connections: Moderately Integrated (10/02/2019)   Social Connection and Isolation Panel [NHANES]   . Frequency of Communication with Friends and Family: More than three times a week   . Frequency of Social Gatherings with Friends and Family: More than three times a week   . Attends Religious Services: 1 to 4 times per year   . Active Member of Clubs or Organizations: Yes   . Attends Archivist Meetings: 1 to 4 times per year   . Marital Status: Never  married      CURRENT MEDS:   Current Outpatient Medications (Endocrine & Metabolic):  .  empagliflozin (JARDIANCE) 10 MG TABS tablet, Take 10 mg by mouth daily.   Current Outpatient Medications (Cardiovascular):  .  furosemide (LASIX) 40 MG tablet, TAKE 2 TABLETS BY MOUTH IN THE MORNING AND 1 TABLET AT NIGHT (Patient taking differently: 40 mg 2 (two) times daily.) .  metolazone (ZAROXOLYN) 2.5 MG tablet, Take 2.5 mg by mouth. Once a week; additional dose if needed .  metoprolol tartrate (LOPRESSOR) 100 MG tablet, Take 1 tablet (100 mg total) by mouth 2 (two) times daily. .  nitroGLYCERIN (NITROSTAT) 0.4 MG SL tablet, Place 1 tablet (0.4 mg total) under the tongue every 5 (five) minutes as needed for chest  pain.   Current Outpatient Medications (Respiratory):  .  albuterol (VENTOLIN HFA) 108 (90 Base) MCG/ACT inhaler, Inhale 1-2 puffs into the lungs every 6 (six) hours as needed for wheezing or shortness of breath. .  Fluticasone-Umeclidin-Vilant (TRELEGY ELLIPTA) 100-62.5-25 MCG/INH AEPB, Inhale 1 puff into the lungs daily. Marland Kitchen  ipratropium-albuterol (DUONEB) 0.5-2.5 (3) MG/3ML SOLN, INHALE THE CONTENTS OF 1 VIAL(3MLS) VIA NEBULIZER 3 TIMES DAILY AS NEEDED .  promethazine (PHENERGAN) 12.5 MG tablet, Take 1 tablet (12.5 mg total) by mouth every 8 (eight) hours as needed for nausea or vomiting.   Current Outpatient Medications (Analgesics):  .  allopurinol (ZYLOPRIM) 100 MG tablet, Take 1 tablet (100 mg total) by mouth 2 (two) times daily. .  butalbital-acetaminophen-caffeine (FIORICET, ESGIC) 50-325-40 MG tablet, Take 1 tablet by mouth every 6 (six) hours as needed.  (Patient not taking: Reported on 12/18/2021) .  colchicine 0.6 MG tablet, Take 1 tablet (0.6 mg total) by mouth daily. As directed .  oxyCODONE-acetaminophen (PERCOCET/ROXICET) 5-325 MG tablet, Take 1 tablet by mouth every 4 (four) hours as needed for severe pain.   Current Outpatient Medications (Hematological):  Marland Kitchen   Iron-FA-B Cmp-C-Biot-Probiotic (FUSION PLUS) CAPS, Take 1 capsule by mouth daily. .  rivaroxaban (XARELTO) 20 MG TABS tablet, Take 20 mg by mouth daily with supper.   Current Outpatient Medications (Other):  Marland Kitchen  Cholecalciferol (D 5000) 125 MCG (5000 UT) capsule, Take by mouth daily. .  Cholecalciferol (VITAMIN D) 50 MCG (2000 UT) CAPS, Take 1 capsule (2,000 Units total) by mouth daily. Marland Kitchen  docusate sodium (COLACE) 100 MG capsule, Take 100 mg by mouth 2 (two) times daily. Marland Kitchen  gabapentin (NEURONTIN) 600 MG tablet, Take 0.5 tablets (300 mg total) by mouth 2 (two) times daily. (Patient taking differently: Take 600 mg by mouth 3 (three) times daily.) .  meclizine (ANTIVERT) 12.5 MG tablet, TAKE 2 TABLETS (25 MG TOTAL) BY MOUTH 3 TIMES DAILY AS NEEDED FOR DIZZINESS .  nortriptyline (PAMELOR) 10 MG capsule, Take 20 mg by mouth at bedtime.  Marland Kitchen  omeprazole (PRILOSEC) 20 MG capsule, TAKE 2 CAPSULES BY MOUTH TWICE A DAY BEFORE A MEAL .  OXYGEN, Inhale 4 L/L into the lungs. .  potassium chloride (KLOR-CON) 10 MEQ tablet, Take 4 tablets (40 mEq total) by mouth 2 (two) times daily. And additional 45mq on metolazone days .  zolpidem (AMBIEN) 5 MG tablet, Take 5 mg by mouth at bedtime as needed for sleep. No current facility-administered medications for this encounter.    ED Course: Pt in Ed *** Vitals:   09/15/22 1246 09/15/22 1256 09/15/22 1257 09/15/22 1700  BP: 100/72   101/60  Pulse: 94   (!) 103  Resp: 18   17  Temp: 97.6 F (36.4 C)   97.8 F (36.6 C)  TempSrc: Oral   Oral  SpO2:  100%  94%  Weight:   120.2 kg   Height:   '5\' 4"'$  (1.626 m)    No intake/output data recorded. SpO2: 94 % O2 Flow Rate (L/min): 3 L/min Blood work in ed shows  Results for orders placed or performed during the hospital encounter of 09/15/22 (from the past 48 hour(s))  CBC with Differential     Status: Abnormal   Collection Time: 09/15/22 12:59 PM  Result Value Ref Range   WBC 6.7 4.0 - 10.5 K/uL   RBC 4.54 3.87  - 5.11 MIL/uL   Hemoglobin 9.9 (L) 12.0 - 15.0 g/dL   HCT 35.6 (L) 36.0 - 46.0 %  MCV 78.4 (L) 80.0 - 100.0 fL   MCH 21.8 (L) 26.0 - 34.0 pg   MCHC 27.8 (L) 30.0 - 36.0 g/dL   RDW 24.0 (H) 11.5 - 15.5 %   Platelets 269 150 - 400 K/uL   nRBC 0.9 (H) 0.0 - 0.2 %   Neutrophils Relative % 73 %   Neutro Abs 4.9 1.7 - 7.7 K/uL   Lymphocytes Relative 18 %   Lymphs Abs 1.2 0.7 - 4.0 K/uL   Monocytes Relative 8 %   Monocytes Absolute 0.5 0.1 - 1.0 K/uL   Eosinophils Relative 1 %   Eosinophils Absolute 0.0 0.0 - 0.5 K/uL   Basophils Relative 0 %   Basophils Absolute 0.0 0.0 - 0.1 K/uL   WBC Morphology MORPHOLOGY UNREMARKABLE    Smear Review MORPHOLOGY UNREMARKABLE    Immature Granulocytes 0 %   Abs Immature Granulocytes 0.02 0.00 - 0.07 K/uL   Polychromasia PRESENT    Target Cells PRESENT     Comment: Performed at [facility], [address] [place], [place] [address]  Comprehensive metabolic panel     Status: Abnormal   Collection Time: 09/15/22 12:59 PM  Result Value Ref Range   Sodium 137 135 - 145 mmol/L   Potassium 3.7 3.5 - 5.1 mmol/L   Chloride 93 (L) 98 - 111 mmol/L   CO2 27 22 - 32 mmol/L   Glucose, Bld 104 (H) 70 - 99 mg/dL    Comment: Glucose reference range applies only to samples taken after fasting for at least 8 hours.   BUN 47 (H) 6 - 20 mg/dL   Creatinine, Ser 3.54 (H) 0.44 - 1.00 mg/dL   Calcium 9.0 8.9 - 10.3 mg/dL   Total Protein 7.4 6.5 - 8.1 g/dL   Albumin 3.0 (L) 3.5 - 5.0 g/dL   AST 57 (H) 15 - 41 U/L   ALT 35 0 - 44 U/L   Alkaline Phosphatase 80 38 - 126 U/L   Total Bilirubin 9.6 (H) 0.3 - 1.2 mg/dL   GFR, Estimated 15 (L) >60 mL/min    Comment: (NOTE) Calculated using the CKD-EPI Creatinine Equation (2021)    Anion gap 17 (H) 5 - 15    Comment: Performed at [facility], [address] [place], [place] [address]  Lipase, blood     Status: None   Collection Time: 09/15/22 12:59 PM  Result Value Ref Range   Lipase 46 11 - 51  U/L    Comment: Performed at [facility] [place], [place] [address]    In Ed pt received  Meds ordered this encounter  Medications  . sodium chloride 0.9 % bolus 500 mL  . fentaNYL (SUBLIMAZE) injection 50 mcg    Unresulted Labs (From admission, onward)     Start     Ordered   09/15/22 1256  Urinalysis, Routine w reflex microscopic  Once,   URGENT        09/15/22 1255             Admission Imaging : US ABDOMEN LIMITED RUQ (LIVER/GB)  Result Date: 09/15/2022 CLINICAL DATA:  Elevated bilirubin EXAM: ULTRASOUND ABDOMEN LIMITED RIGHT UPPER QUADRANT COMPARISON:  CT abdomen and pelvis 12/01/2018 FINDINGS: Gallbladder: Sludge is present within the gallbladder. Small amount of pericholecystic fluid. The gallbladder wall is mildly thickened measuring 5 mm. Sonographic Murphy's sign was positive. Common bile duct: Diameter: 3 mm Liver: No focal lesion identified. Increased echogenicity. Portal vein is patent  on Doppler imaging with bidirectional flow. Other: None. IMPRESSION: 1. Findings compatible with acute cholecystitis. 2. 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 Electronically Signed   By: Placido Sou M.D.   On: 09/15/2022 23:07      Physical Examination: Vitals:   09/15/22 1246 09/15/22 1256 09/15/22 1257 09/15/22 1700  BP: 100/72   101/60  Pulse: 94   (!) 103  Temp: 97.6 F (36.4 C)   97.8 F (36.6 C)  Resp: 18   17  Height:   '5\' 4"'$  (1.626 m)   Weight:   120.2 kg   SpO2:  100%  94%  TempSrc: Oral   Oral  BMI (Calculated):   45.46    Physical Exam Vitals and nursing note reviewed.  Constitutional:      General: She is not in acute distress.    Appearance: Normal appearance. She is not ill-appearing, toxic-appearing or diaphoretic.  HENT:     Head: Normocephalic and atraumatic.     Right Ear: Hearing and external ear normal.     Left Ear: Hearing and external ear normal.      Nose: Nose normal. No nasal deformity.     Mouth/Throat:     Lips: Pink.     Mouth: Mucous membranes are moist.     Tongue: No lesions.     Pharynx: Oropharynx is clear.  Eyes:     Extraocular Movements: Extraocular movements intact.  Neck:     Vascular: No carotid bruit.  Cardiovascular:     Rate and Rhythm: Normal rate and regular rhythm.     Pulses: Normal pulses.     Heart sounds: Normal heart sounds.     Comments: EKG says a.fib but pt is regular on auscultation.   Pulmonary:     Effort: Pulmonary effort is normal.     Breath sounds: Normal breath sounds.  Abdominal:     General: Abdomen is protuberant. Bowel sounds are normal. There is distension.     Palpations: Abdomen is soft. There is no mass.     Tenderness: There is abdominal tenderness in the right upper quadrant, right lower quadrant and periumbilical area. There is no guarding.     Hernia: No hernia is present.    Musculoskeletal:     Right lower leg: Edema present.     Left lower leg: Edema present.  Skin:    General: Skin is warm.  Neurological:     General: No focal deficit present.     Mental Status: She is alert and oriented to person, place, and time.     Cranial Nerves: Cranial nerves 2-12 are intact.     Motor: Motor function is intact.  Psychiatric:        Attention and Perception: Attention normal.        Mood and Affect: Mood normal.        Speech: Speech normal.        Behavior: Behavior is cooperative.        Cognition and Memory: Cognition normal.        Assessment and Plan: No notes have been filed under this hospital service. Service: Hospitalist          DVT prophylaxis:  Heparin  Code Status:  Full code  Family Communication:  Jerilee Field (Mother)  [phone] (Mobile)   Disposition Plan:  Home  Consults called:  General surgery Dr. Hampton Abbot  Admission status: Inpatient  Unit/ Expected LOS: Progressive/4-5 days.  Para Skeans MD [facility]   6 PM- 2 AM. Please contact me via secure Chat 6 PM-2 AM. [phone] ( Pager ) To contact the [facility] Attending or Consulting provider [place] or covering provider during after hours [place], for this patient.   Check the care team in [facility] and look for a) attending/consulting [facility] provider listed and b) the [facility] team listed Log into [email or web address] and use [place]'s universal password to access. If you do not have the password, please contact the hospital operator. Locate the [facility] provider you are looking for under [facility] and page to a number that you can be directly reached. If you still have difficulty reaching the provider, please page the [facility] (Director on Call) for the Hospitalists listed on amion for assistance. [email or web address] 09/15/2022, 11:58 PM

## 2022-09-15 NOTE — ED Provider Notes (Signed)
[facility] Provider Note    Event Date/Time   First MD Initiated Contact with Patient 09/15/22 2121     (approximate)  History   Chief Complaint: Renal Problem  HPI  Denea Cheaney is a 56 y.o. female with a past medical history of anxiety, CHF, COPD on 4 L chronically, CVA, diabetes, gastric reflux, hypertension, presents to the emergency department for abnormal lab work.  According to the patient ever since getting an endoscopy and colonoscopy approximately 1 to 2 months ago she has been experiencing nausea occasional episodes of vomiting she has been experiencing upper abdominal pain.  States she had lab work performed by her doctor which showed significant creatinine elevation the patient was sent to the emergency department for evaluation.  Patient states abdominal pain but states this has been an ongoing issue for months across the entire abdomen more so in the upper abdomen.  Patient's family member is also noted over the past few weeks or more yellowing of the patient's eyes.  Physical Exam   Triage Vital Signs: ED Triage Vitals  Enc Vitals Group     BP 09/15/22 1246 100/72     Pulse Rate 09/15/22 1246 94     Resp 09/15/22 1246 18     Temp 09/15/22 1246 97.6 F (36.4 C)     Temp Source 09/15/22 1246 Oral     SpO2 09/15/22 1256 100 %     Weight 09/15/22 1257 264 lb 15.9 oz (120.2 kg)     Height 09/15/22 1257 '5\' 4"'$  (1.626 m)     Head Circumference --      Peak Flow --      Pain Score 09/15/22 1257 0     Pain Loc --      Pain Edu? --      Excl. in [place]? --     Most recent vital signs: Vitals:   09/15/22 1256 09/15/22 1700  BP:  101/60  Pulse:  (!) 103  Resp:  17  Temp:  97.8 F (36.6 C)  SpO2: 100% 94%    General: Awake, no distress.  Mild scleral icterus CV:  Good peripheral perfusion.  Regular rate and rhythm  Resp:  Normal effort.  Equal breath sounds bilaterally.  Abd:  No distention.  Soft, mild to moderate upper  abdominal tenderness Other:  1+ lower extremity edema bilaterally  ED Results / Procedures / Treatments   EKG  EKG viewed and interpreted by myself appear to show atrial fibrillation 113 bpm with a narrow QRS, right axis deviation, largely normal intervals with nonspecific ST changes.  RADIOLOGY  Ultrasound shows signs of acute cholecystitis.  With normal CBD.   MEDICATIONS ORDERED IN ED: Medications  sodium chloride 0.9 % bolus 500 mL (has no administration in time range)  fentaNYL (SUBLIMAZE) injection 50 mcg (has no administration in time range)     IMPRESSION / MDM / ASSESSMENT AND PLAN / ED COURSE  I reviewed the triage vital signs and the nursing notes.  Patient's presentation is most consistent with acute presentation with potential threat to life or bodily function.  Patient presents emergency department for abnormal lab work.  States she had lab work performed by her doctor and was called saying that her kidney function had gone down she needs to go to the emergency department.  Patient states for the past month or so since her endoscopy/colonoscopy she has been experiencing nausea and occasional episodes of vomiting.  States she has not  been able to tolerate as much orally has not been drinking as much so she has not been taking as much of her diuretics either.  On my examination patient does have upper abdominal tenderness which she states has been an ongoing issue for months which is also one of the reasons why they did the endoscopy in the first place.  Patient does appear to have mild scleral icterus.  On lab work her CBC is reassuring she has chronic anemia largely unchanged, her chemistry however shows significant increase in creatinine her baseline creatinine appears to be around 0.7-0.9 and is currently 3.54 indicating acute renal insufficiency/failure.  We will begin gentle IV hydration.  Patient's bilirubin is also quite elevated at 9.6 however the remainder of her LFTs  are not significantly elevated.  This would explain the patient's scleral icterus.  We will obtain a right upper quadrant ultrasound to further evaluate.  Patient will likely need to be admitted to the hospital service for further work-up and treatment once her emergency department work-up is been completed  Ultrasound has resulted showing signs of acute cholecystitis.  Given the patient's elevated bilirubin and AST I spoke with Dr. Hampton Abbot who has reviewed the patient's chart and ultrasound findings.  He recommends proceeding with an MRCP to further evaluate.  I spoke to the hospitalist given the patient's multiple medical problems and she is agreeable to admit the patient to the hospital service with surgery consulting and possible GI consultation depending on MRCP findings.  FINAL CLINICAL IMPRESSION(S) / ED DIAGNOSES   Acute renal failure Hyperbilirubinemia    Note:  This document was prepared using Dragon voice recognition software and may include unintentional dictation errors.   Harvest Dark, MD 09/15/22 2332

## 2022-09-15 NOTE — H&P (Signed)
History and Physical    Chief Complaint: AKI   HISTORY OF PRESENT ILLNESS: Chelsea Ramsey is an 56 y.o. female  Seen by pcp today for not feeling well and abd pain for about a month per patient.  Her pcp called and advised she come to ed for abnormal labs.  Pt states she has nausea and pain has been getting worse  and shows the RUQ / Rt flank/ Rt side of abdomen involved.  Pt otherwise does not report any complaints. Continues to smoke.  Pt has PMH as below: Past Medical History:  Diagnosis Date   Acute drug-induced gout of left foot 03/01/2018   Last Assessment & Plan:  Likely at least partially brought on by diuresis.  Needs to continue to diurese  Will push hydration Stop allopurinol given initiation during acute flare may worsen this, re-broach this when asymptomatic Avoid nsaids given stomach pain Trial colchicine Add acetaminophen Ice, elevate, rest   Allergy    seasonal   Anxiety    Arthritis    Right Knee   Asthma    CHF (congestive heart failure) (HCC)    COPD (chronic obstructive pulmonary disease) (HCC)    Coronary artery disease    Leaky heart valve   Diabetes mellitus without complication (HCC)    Dysrhythmia    Fibromyalgia    GERD (gastroesophageal reflux disease)    Hypertension    Pneumonia    PUD (peptic ulcer disease)    Pulmonary HTN (HCC)    Rheumatic fever/heart disease    Sleep apnea    Review of Systems  Cardiovascular:  Positive for leg swelling.  Gastrointestinal:  Positive for abdominal pain and nausea.  All other systems reviewed and are negative.  Allergies  Allergen Reactions   Amiodarone Nausea And Vomiting   Aspirin Swelling   Flexeril [Cyclobenzaprine] Swelling   Trazamine [Trazodone & Diet Manage Prod] Nausea And Vomiting   Codeine Rash   Tramadol Rash   Past Surgical History:  Procedure Laterality Date   ADENOIDECTOMY     COLONOSCOPY WITH PROPOFOL N/A 11/06/2019   Procedure: COLONOSCOPY WITH PROPOFOL;  Surgeon:  Virgel Manifold, MD;  Location: [facility] ENDOSCOPY;  Service: Endoscopy;  Laterality: N/A;  2 day prep    COLONOSCOPY WITH PROPOFOL N/A 06/29/2022   Procedure: COLONOSCOPY WITH PROPOFOL;  Surgeon: Lin Landsman, MD;  Location: [facility] ENDOSCOPY;  Service: Gastroenterology;  Laterality: N/A;   CORONARY ARTERY BYPASS GRAFT     June 27 2018   ESOPHAGOGASTRODUODENOSCOPY (EGD) WITH PROPOFOL N/A 05/25/2018   Procedure: ESOPHAGOGASTRODUODENOSCOPY (EGD) WITH PROPOFOL;  Surgeon: Lucilla Lame, MD;  Location: [facility] ENDOSCOPY;  Service: Endoscopy;  Laterality: N/A;   ESOPHAGOGASTRODUODENOSCOPY (EGD) WITH PROPOFOL N/A 11/06/2019   Procedure: ESOPHAGOGASTRODUODENOSCOPY (EGD) WITH PROPOFOL;  Surgeon: Virgel Manifold, MD;  Location: [facility] ENDOSCOPY;  Service: Endoscopy;  Laterality: N/A;   ESOPHAGOGASTRODUODENOSCOPY (EGD) WITH PROPOFOL N/A 06/29/2022   Procedure: ESOPHAGOGASTRODUODENOSCOPY (EGD) WITH PROPOFOL;  Surgeon: Lin Landsman, MD;  Location: [facility] ENDOSCOPY;  Service: Gastroenterology;  Laterality: N/A;   GIVENS CAPSULE STUDY N/A 07/22/2022   Procedure: GIVENS CAPSULE STUDY;  Surgeon: Lin Landsman, MD;  Location: [facility] ENDOSCOPY;  Service: Gastroenterology;  Laterality: N/A;   MITRAL VALVE REPLACEMENT     MULTIPLE TOOTH EXTRACTIONS     TONSILLECTOMY     TOTAL KNEE ARTHROPLASTY Right 09/04/2016   Procedure: TOTAL KNEE ARTHROPLASTY; with lateral release;  Surgeon: Earlie Server, MD;  Location: [place];  Service: Orthopedics;  Laterality: Right;  Social History   Socioeconomic History   Marital status: Married    Spouse name: Not on file   Number of children: Not on file   Years of education: Not on file   Highest education level: Not on file  Occupational History   Occupation: disabled  Tobacco Use   Smoking status: Every Day    Packs/day: 0.10    Types: Cigarettes   Smokeless tobacco: Never   Tobacco comments:    4/20 was not able to quit.  Still at 1-3 a day. She would like  to wait to set a new quit date until we get back to class to have the extra in person support  Vaping Use   Vaping Use: Never used  Substance and Sexual Activity   Alcohol use: Never    Alcohol/week: 3.0 standard drinks of alcohol    Types: 3 Cans of beer per week    Comment: 16 oz per week   Drug use: Not Currently    Comment: Previous use of cocaine and marijuana last use 07/31/16    Sexual activity: Not Currently  Other Topics Concern   Not on file  Social History Narrative   Not on file   Social Determinants of Health   Financial Resource Strain: Low Risk  (10/02/2019)   Overall Financial Resource Strain (CARDIA)    Difficulty of Paying Living Expenses: Not hard at all  Food Insecurity: No Food Insecurity (03/19/2020)   Hunger Vital Sign    Worried About Running Out of Food in the Last Year: Never true    Ran Out of Food in the Last Year: Never true  Transportation Needs: Unmet Transportation Needs (03/19/2020)   PRAPARE - Transportation    Lack of Transportation (Medical): No    Lack of Transportation (Non-Medical): Yes  Physical Activity: Insufficiently Active (10/02/2019)   Exercise Vital Sign    Days of Exercise per Week: 7 days    Minutes of Exercise per Session: 20 min  Stress: No Stress Concern Present (10/02/2019)   [employer] [place]    Feeling of Stress : Not at all  Social Connections: Moderately Integrated (10/02/2019)   Social Connection and Isolation Panel [NHANES]    Frequency of Communication with Friends and Family: More than three times a week    Frequency of Social Gatherings with Friends and Family: More than three times a week    Attends Religious Services: 1 to 4 times per year    Active Member of [employer] or Organizations: Yes    Attends Archivist Meetings: 1 to 4 times per year    Marital Status: Never married      CURRENT MEDS:   Current Outpatient Medications (Endocrine &  Metabolic):    empagliflozin (JARDIANCE) 10 MG TABS tablet, Take 10 mg by mouth daily.  Current Facility-Administered Medications (Cardiovascular):    nitroGLYCERIN (NITROSTAT) SL tablet 0.4 mg  Current Outpatient Medications (Cardiovascular):    furosemide (LASIX) 40 MG tablet, TAKE 2 TABLETS BY MOUTH IN THE MORNING AND 1 TABLET AT NIGHT (Patient taking differently: 40 mg 2 (two) times daily.)   metolazone (ZAROXOLYN) 2.5 MG tablet, Take 2.5 mg by mouth. Once a week; additional dose if needed   metoprolol tartrate (LOPRESSOR) 100 MG tablet, Take 1 tablet (100 mg total) by mouth 2 (two) times daily.   nitroGLYCERIN (NITROSTAT) 0.4 MG SL tablet, Place 1 tablet (0.4 mg total) under the tongue every 5 (five) minutes as  needed for chest pain.  Current Facility-Administered Medications (Respiratory):    albuterol (VENTOLIN HFA) 108 (90 Base) MCG/ACT inhaler 1-2 puff   ipratropium-albuterol (DUONEB) 0.5-2.5 (3) MG/3ML nebulizer solution 3 mL   promethazine (PHENERGAN) tablet 12.5 mg  Current Outpatient Medications (Respiratory):    albuterol (VENTOLIN HFA) 108 (90 Base) MCG/ACT inhaler, Inhale 1-2 puffs into the lungs every 6 (six) hours as needed for wheezing or shortness of breath.   Fluticasone-Umeclidin-Vilant (TRELEGY ELLIPTA) 100-62.5-25 MCG/INH AEPB, Inhale 1 puff into the lungs daily.   ipratropium-albuterol (DUONEB) 0.5-2.5 (3) MG/3ML SOLN, INHALE THE CONTENTS OF 1 VIAL(3MLS) VIA NEBULIZER 3 TIMES DAILY AS NEEDED   promethazine (PHENERGAN) 12.5 MG tablet, Take 1 tablet (12.5 mg total) by mouth every 8 (eight) hours as needed for nausea or vomiting.  Current Facility-Administered Medications (Analgesics):    acetaminophen (TYLENOL) tablet 650 mg **OR** acetaminophen (TYLENOL) suppository 650 mg   allopurinol (ZYLOPRIM) tablet 100 mg   fentaNYL (SUBLIMAZE) injection 25 mcg   HYDROcodone-acetaminophen (NORCO/VICODIN) 5-325 MG per tablet 1 tablet  Current Outpatient Medications  (Analgesics):    allopurinol (ZYLOPRIM) 100 MG tablet, Take 1 tablet (100 mg total) by mouth 2 (two) times daily.   butalbital-acetaminophen-caffeine (FIORICET, ESGIC) 50-325-40 MG tablet, Take 1 tablet by mouth every 6 (six) hours as needed.  (Patient not taking: Reported on 12/18/2021)   colchicine 0.6 MG tablet, Take 1 tablet (0.6 mg total) by mouth daily. As directed   oxyCODONE-acetaminophen (PERCOCET/ROXICET) 5-325 MG tablet, Take 1 tablet by mouth every 4 (four) hours as needed for severe pain.  Current Facility-Administered Medications (Hematological):    rivaroxaban (XARELTO) tablet 20 mg  Current Outpatient Medications (Hematological):    Iron-FA-B Cmp-C-Biot-Probiotic (FUSION PLUS) CAPS, Take 1 capsule by mouth daily.   rivaroxaban (XARELTO) 20 MG TABS tablet, Take 20 mg by mouth daily with supper.  Current Facility-Administered Medications (Other):    0.9 %  sodium chloride infusion   docusate sodium (COLACE) capsule 100 mg   pantoprazole (PROTONIX) injection 40 mg   sodium chloride flush (NS) 0.9 % injection 3 mL  Current Outpatient Medications (Other):    Cholecalciferol (D 5000) 125 MCG (5000 UT) capsule, Take by mouth daily.   Cholecalciferol (VITAMIN D) 50 MCG (2000 UT) CAPS, Take 1 capsule (2,000 Units total) by mouth daily.   docusate sodium (COLACE) 100 MG capsule, Take 100 mg by mouth 2 (two) times daily.   gabapentin (NEURONTIN) 600 MG tablet, Take 0.5 tablets (300 mg total) by mouth 2 (two) times daily. (Patient taking differently: Take 600 mg by mouth 3 (three) times daily.)   meclizine (ANTIVERT) 12.5 MG tablet, TAKE 2 TABLETS (25 MG TOTAL) BY MOUTH 3 TIMES DAILY AS NEEDED FOR DIZZINESS   nortriptyline (PAMELOR) 10 MG capsule, Take 20 mg by mouth at bedtime.    omeprazole (PRILOSEC) 20 MG capsule, TAKE 2 CAPSULES BY MOUTH TWICE A DAY BEFORE A MEAL   OXYGEN, Inhale 4 L/L into the lungs.   potassium chloride (KLOR-CON) 10 MEQ tablet, Take 4 tablets (40 mEq total) by  mouth 2 (two) times daily. And additional 29mq on metolazone days   zolpidem (AMBIEN) 5 MG tablet, Take 5 mg by mouth at bedtime as needed for sleep.    ED Course: Pt in Ed is alert/ oriented and gives history.  Vitals:   09/15/22 1257 09/15/22 1700 09/16/22 0018 09/16/22 0020  BP:  101/60  (!) 117/55  Pulse:  (!) 103  94  Resp:  17  18  Temp:  97.8 F (36.6 C) (!) 97.4 F (36.3 C)   TempSrc:  Oral Oral   SpO2:  94%  94%  Weight: 120.2 kg     Height: '5\' 4"'$  (1.626 m)      No intake/output data recorded. SpO2: 94 % O2 Flow Rate (L/min): 3 L/min Blood work in ed shows: Chronic anemia with a hemoglobin of 9.9 normal platelet count. CMP shows acute kidney injury with a creatinine of 0.54, anion gap of 16 serum bicarb 27, AST 57, bili of 9.6.  Results for orders placed or performed during the hospital encounter of 09/15/22 (from the past 48 hour(s))  CBC with Differential     Status: Abnormal   Collection Time: 09/15/22 12:59 PM  Result Value Ref Range   WBC 6.7 4.0 - 10.5 K/uL   RBC 4.54 3.87 - 5.11 MIL/uL   Hemoglobin 9.9 (L) 12.0 - 15.0 g/dL   HCT 35.6 (L) 36.0 - 46.0 %   MCV 78.4 (L) 80.0 - 100.0 fL   MCH 21.8 (L) 26.0 - 34.0 pg   MCHC 27.8 (L) 30.0 - 36.0 g/dL   RDW 24.0 (H) 11.5 - 15.5 %   Platelets 269 150 - 400 K/uL   nRBC 0.9 (H) 0.0 - 0.2 %   Neutrophils Relative % 73 %   Neutro Abs 4.9 1.7 - 7.7 K/uL   Lymphocytes Relative 18 %   Lymphs Abs 1.2 0.7 - 4.0 K/uL   Monocytes Relative 8 %   Monocytes Absolute 0.5 0.1 - 1.0 K/uL   Eosinophils Relative 1 %   Eosinophils Absolute 0.0 0.0 - 0.5 K/uL   Basophils Relative 0 %   Basophils Absolute 0.0 0.0 - 0.1 K/uL   WBC Morphology MORPHOLOGY UNREMARKABLE    Smear Review MORPHOLOGY UNREMARKABLE    Immature Granulocytes 0 %   Abs Immature Granulocytes 0.02 0.00 - 0.07 K/uL   Polychromasia PRESENT    Target Cells PRESENT     Comment: Performed at [facility], [address] [place], [place] [address]   Comprehensive metabolic panel     Status: Abnormal   Collection Time: 09/15/22 12:59 PM  Result Value Ref Range   Sodium 137 135 - 145 mmol/L   Potassium 3.7 3.5 - 5.1 mmol/L   Chloride 93 (L) 98 - 111 mmol/L   CO2 27 22 - 32 mmol/L   Glucose, Bld 104 (H) 70 - 99 mg/dL    Comment: Glucose reference range applies only to samples taken after fasting for at least 8 hours.   BUN 47 (H) 6 - 20 mg/dL   Creatinine, Ser 3.54 (H) 0.44 - 1.00 mg/dL   Calcium 9.0 8.9 - 10.3 mg/dL   Total Protein 7.4 6.5 - 8.1 g/dL   Albumin 3.0 (L) 3.5 - 5.0 g/dL   AST 57 (H) 15 - 41 U/L   ALT 35 0 - 44 U/L   Alkaline Phosphatase 80 38 - 126 U/L   Total Bilirubin 9.6 (H) 0.3 - 1.2 mg/dL   GFR, Estimated 15 (L) >60 mL/min    Comment: (NOTE) Calculated using the CKD-EPI Creatinine Equation (2021)    Anion gap 17 (H) 5 - 15    Comment: Performed at [facility], [address] [place], [place] [address]  Lipase, blood     Status: None   Collection Time: 09/15/22 12:59 PM  Result Value Ref Range   Lipase 46 11 - 51 U/L    Comment: Performed at [facility],  [place], [place] [address]    In Ed pt received  Meds ordered this encounter  Medications   sodium chloride 0.9 % bolus 500 mL   fentaNYL (SUBLIMAZE) injection 50 mcg   pantoprazole (PROTONIX) injection 40 mg   LORazepam (ATIVAN) injection 1 mg   fentaNYL (SUBLIMAZE) injection 25 mcg   albuterol (VENTOLIN HFA) 108 (90 Base) MCG/ACT inhaler 1-2 puff   allopurinol (ZYLOPRIM) tablet 100 mg   docusate sodium (COLACE) capsule 100 mg   ipratropium-albuterol (DUONEB) 0.5-2.5 (3) MG/3ML nebulizer solution 3 mL   rivaroxaban (XARELTO) tablet 20 mg   promethazine (PHENERGAN) tablet 12.5 mg   nitroGLYCERIN (NITROSTAT) SL tablet 0.4 mg   sodium chloride flush (NS) 0.9 % injection 3 mL   0.9 %  sodium chloride infusion   OR Linked Order Group    acetaminophen (TYLENOL) tablet 650 mg    acetaminophen (TYLENOL) suppository  650 mg   HYDROcodone-acetaminophen (NORCO/VICODIN) 5-325 MG per tablet 1 tablet    Unresulted Labs (From admission, onward)     Start     Ordered   09/16/22 0500  Comprehensive metabolic panel  Tomorrow morning,   STAT        09/16/22 0040   09/16/22 0500  CBC  Tomorrow morning,   STAT        09/16/22 0040   09/16/22 0037  HIV Antibody (routine testing w rflx)  (HIV Antibody (Routine testing w reflex) panel)  Once,   URGENT        09/16/22 0040   09/16/22 0027  Hemoglobin A1c  Add-on,   AD        09/16/22 0026   09/16/22 0003  Urine Drug Screen, Qualitative ([place] only)  Add-on,   AD        09/16/22 0002   09/16/22 0002  Lactic acid, plasma  STAT Now then every 3 hours,   STAT      09/16/22 0001   09/15/22 1256  Urinalysis, Routine w reflex microscopic  Once,   URGENT        09/15/22 1255             Admission Imaging : US ABDOMEN LIMITED RUQ (LIVER/GB)  Result Date: 09/15/2022 CLINICAL DATA:  Elevated bilirubin EXAM: ULTRASOUND ABDOMEN LIMITED RIGHT UPPER QUADRANT COMPARISON:  CT abdomen and pelvis 12/01/2018 FINDINGS: Gallbladder: Sludge is present within the gallbladder. Small amount of pericholecystic fluid. The gallbladder wall is mildly thickened measuring 5 mm. Sonographic Murphy's sign was positive. Common bile duct: Diameter: 3 mm Liver: No focal lesion identified. Increased echogenicity. Portal vein is patent on Doppler imaging with bidirectional flow. Other: None. IMPRESSION: 1. Findings compatible with acute cholecystitis. 2. 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 Electronically Signed   By: Placido Sou M.D.   On: 09/15/2022 23:07      Physical Examination: Vitals:   09/15/22 1257 09/15/22 1700 09/16/22 0018 09/16/22 0020  BP:  101/60  (!) 117/55  Pulse:  (!) 103  94  Temp:  97.8 F (36.6 C) (!) 97.4 F (36.3 C)   Resp:  17  18  Height: '5\' 4"'$  (1.626 m)     Weight: 120.2 kg     SpO2:  94%  94%   TempSrc:  Oral Oral   BMI (Calculated): 45.46      Physical Exam Vitals and nursing note reviewed.  Constitutional:      General: She is not in acute distress.  Appearance: Normal appearance. She is not ill-appearing, toxic-appearing or diaphoretic.  HENT:     Head: Normocephalic and atraumatic.     Right Ear: Hearing and external ear normal.     Left Ear: Hearing and external ear normal.     Nose: Nose normal. No nasal deformity.     Mouth/Throat:     Lips: Pink.     Mouth: Mucous membranes are moist.     Tongue: No lesions.     Pharynx: Oropharynx is clear.  Eyes:     Extraocular Movements: Extraocular movements intact.  Neck:     Vascular: No carotid bruit.  Cardiovascular:     Rate and Rhythm: Normal rate. Rhythm irregular.     Pulses: Normal pulses.     Heart sounds: Normal heart sounds.     Comments: EKG says a.fib but pt is regular on auscultation.   Pulmonary:     Effort: Pulmonary effort is normal.     Breath sounds: Normal breath sounds.  Abdominal:     General: Abdomen is protuberant. Bowel sounds are normal. There is distension.     Palpations: Abdomen is soft. There is no mass.     Tenderness: There is abdominal tenderness in the right upper quadrant, right lower quadrant and periumbilical area. There is no guarding.     Hernia: No hernia is present.    Musculoskeletal:     Right lower leg: Edema present.     Left lower leg: Edema present.  Skin:    General: Skin is warm.  Neurological:     General: No focal deficit present.     Mental Status: She is alert and oriented to person, place, and time.     Cranial Nerves: Cranial nerves 2-12 are intact.     Motor: Motor function is intact.  Psychiatric:        Attention and Perception: Attention normal.        Mood and Affect: Mood normal.        Speech: Speech normal.        Behavior: Behavior is cooperative.        Cognition and Memory: Cognition normal.        Assessment and Plan: * Abdominal  pain Pt c/o right sided abd pain for one month.  She went to pcp who on labs found abnormal lft and sent her to ed.  US imaging shows A/c cholecystitis with elevated Bilirubin and case dw/ surgeon by edmd who recommended admission and MRCP for obstruction.  We will admit and start t/t for pain/ MIVF.   Elevated LFTs Acute cholecystitis/ CBD obstruction : MRCP today. General surgery has been consulted- Dr.Piscoay is following pt.   AKI (acute kidney injury) [facility]) Lab Results  Component Value Date   CREATININE 3.54 (H) 09/15/2022   CREATININE 0.78 01/02/2022   CREATININE 0.74 12/24/2021  Attribute to hypotension from meds.  We will avoid contrast and renally dose meds.    HTN (hypertension) Vitals:   09/15/22 1246 09/15/22 1700  BP: 100/72 101/60  BP is low , we will hold diuretics / metoprolol. Fall precautions.    Iron deficiency anemia Pt has h/o IDA / is currently anemic and is on xarelto for a/fib. In afib rvr on ekg.     Latest Ref Rng & Units 09/15/2022   12:59 PM 11/02/2020    3:56 PM 01/31/2020   11:34 AM  CBC  WBC 4.0 - 10.5 K/uL 6.7  7.3  12.2  Hemoglobin 12.0 - 15.0 g/dL 9.9  9.8  11.1   Hematocrit 36.0 - 46.0 % 35.6  34.3  37.9   Platelets 150 - 400 K/uL 269  234  232   we will follow and cont xarelto for now.  Type and screen/ IV ppi.  Pt being followed by Dr.vanga for her IDA and Eval for GI loss. GI eval showed: Endoscopy Shows Studies Complaint Capsule History Speak, Normal Small Bowel Transit, Scattered Small Bowel Lymphangiectasia's, No Source of Iron Deficiency Anemia Identified - 08/18/2022. Endoscopy shows erythematous  mucosa in the antrum, normal duodenal bulb and second portion of duodenum, colonoscopy showed melanosis coli but was otherwise normal 06/29/2022.   A-fib ([place]) Ekg today shows:      Pt in a.fib and we will continue xarelto and monitor on telemetry.    Tobacco dependence Nicotine patch.   History of cocaine use UDS added  on and pending.    Elevated glucose/morbid obesity: We will obtain an A1c and follow-up. Suspect underlying diabetes mellitus type 2. Patient is on Jardiance.    DVT prophylaxis:  Xarelto   Code Status:  Full code  Family Communication:  Jerilee Field (Mother)  [phone] (Mobile)   Disposition Plan:  Home  Consults called:  General surgery Dr. Hampton Abbot  Admission status: Inpatient  Unit/ Expected LOS: Progressive/4-5 days.   Para Skeans MD [facility]  6 PM- 2 AM. Please contact me via secure Chat 6 PM-2 AM. [phone] ( Pager ) To contact the [facility] Attending or Consulting provider [place] or covering provider during after hours [place], for this patient.   Check the care team in [facility] and look for a) attending/consulting [facility] provider listed and b) the [facility] team listed Log into [email or web address] and use [place]'s universal password to access. If you do not have the password, please contact the hospital operator. Locate the [facility] provider you are looking for under [facility] and page to a number that you can be directly reached. If you still have difficulty reaching the provider, please page the [facility] (Director on Call) for the Hospitalists listed on amion for assistance. [email or web address] 09/16/2022, 12:42 AM

## 2022-09-15 NOTE — ED Triage Notes (Signed)
Pt was sent here by her provider for a kidney issue. Pt states that her labs are abnormal. Pt denies pain. Pt states she is nauseous as well.

## 2022-09-15 NOTE — Progress Notes (Signed)
09/15/22  Case discussed with Dr. Kerman Passey.  Patient presents sent by her PCP due to abnormal outpatient labs showing AKI.  In ED, her labwork confirmed AKI with Cr 3.54, but also showed very high total bilirubin of 9.6.  AST mildly elevated to 57, ALT 35, Alk Phos 80.  U/S was done which showed gallbladder wall thickening to 5 mm with pericholecystic fluid, suspicious for acute cholecystitis.  CBD per U/S was 3 mm.  Unclear the etiology of her very high total bilirubin.  I think this warrants obtaining MRCP in order to evaluate for choledocholithiasis or any extrinsic compression of the CBD.  Would recommend admitting to hospitalist for now given her otherwise extensive comorbidities (valvular heart disease, afib, pulmonary HTN, CHF, HTN, COPD, CAD, TIA, OSA, DM).  Full consult note to follow in AM.  Olean Ree, MD

## 2022-09-15 NOTE — ED Provider Triage Note (Signed)
Emergency Medicine Provider Triage Evaluation Note  Chelsea Ramsey , a 56 y.o. female  was evaluated in triage.  Pt reports sent from her outpatient provider for kidney problems. Patient denies SOB/CP. Reports slight nausea. Poor historian.  Patient Active Problem List   Diagnosis Date Noted   Iron deficiency anemia    Non compliance w medication regimen 01/15/2020   Pain medication agreement broken 44/11/270   History of illicit drug use 53/66/4403   Misuse of prescription only drugs 01/15/2020   History of gastric ulcer 01/08/2020   Stomach irritation 01/08/2020   Esophageal dysphagia 01/08/2020   Class 3 severe obesity due to excess calories with serious comorbidity in adult [facility]) 01/08/2020   Pharmacologic therapy 12/14/2019   Prediabetes 09/27/2019   Numbness 08/03/2019   Headache disorder 08/03/2019   Elevated uric acid in blood 02/08/2019   Hyperglycemia 02/08/2019   Secondary osteoarthritis of multiple sites 02/08/2019   Osteoarthritis of knees (Bilateral) (R>L) 02/08/2019   Chronic gout of multiple sites, unspecified cause 02/08/2019   Chronic musculoskeletal pain (Fourth Area of Pain) 02/08/2019   Epidural lipomatosis 02/08/2019   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/08/2019   History of cocaine use 01/18/2019   Chronic gout involving toe of foot, unspecified cause (Left) 01/18/2019   Chronic hip pain (Left) 01/18/2019   Osteoarthritis of sacroiliac joints (Bilateral) 01/18/2019   Other specified dorsopathies, sacral and sacrococcygeal region 01/18/2019   Lumbar facet arthropathy (Bilateral) 01/05/2019   Melena 01/03/2019   Acute esophagogastric ulcer 01/03/2019   Spondylosis without myelopathy or radiculopathy, lumbar region 12/27/2018   Lumbar facet syndrome (Bilateral) 12/27/2018   DDD (degenerative disc disease), lumbar 12/27/2018   Chronic low back pain (Primary Area of Pain) (Bilateral) (L>R) w/o sciatica 12/27/2018   Abnormal MRI, lumbar spine  (01/05/2018) 12/27/2018   Elevated sed rate 12/19/2018   Elevated C-reactive protein 12/19/2018   Diplopia 12/14/2018   Dizziness 12/14/2018   Headache, chronic daily 12/14/2018   Numbness and tingling of both feet 12/14/2018   CHF (congestive heart failure) ([place]) 12/04/2018   Class 3 severe obesity with body mass index (BMI) of 45.0 to 49.9 in adult ([place]) 11/14/2018   Chronic anticoagulation (XARELTO) 11/14/2018   Vitamin D deficiency 11/14/2018   Chronic knee pain after total replacement (Fifth Area of Pain) (Right) 11/14/2018   Chest pain with moderate risk of acute coronary syndrome 10/17/2018   Chronic low back pain (Primary Area of Pain) (Bilateral) w/ sciatica (Left) 10/10/2018   Chronic lower extremity pain (Secondary Area of Pain) (Left) 10/10/2018   Sternal pain (Tertiary Area of Pain) 10/10/2018   Fibromyalgia (Fourth Area of Pain) 10/10/2018   Chronic knee pain (Right) 10/10/2018   Chronic sacroiliac joint pain (Bilateral) (L>R) 10/10/2018   Chronic pain syndrome 10/10/2018   Long term current use of opiate analgesic 10/10/2018   Screening for colon cancer 10/10/2018   Disorder of skeletal system 10/10/2018   Problems influencing health status 10/10/2018   HTN (hypertension) 07/26/2018   S/P MVR (mitral valve replacement) 06/20/2018   Shortness of breath 05/27/2018   GIB (gastrointestinal bleeding) 05/22/2018   A-fib ([place]) 04/22/2018   Insomnia 12/25/2016   Osteoarthritis of knee (Right) 09/04/2016   Tobacco dependence 05/08/2015   GAD (generalized anxiety disorder) 03/13/2015   Moderate to severe pulmonary hypertension ([place]) 03/13/2015   Rheumatic heart disease, unspecified 03/13/2015   Chronic diastolic heart failure ([place]) 02/25/2015   Depression 02/25/2015   History of substance abuse ([place]) 02/25/2015     Review of  Systems  Positive: Nausea, "kidney problems" Negative: Fever, chest pain, SOB  Physical Exam  LMP 11/08/2009 Comment: menopause Gen:   Awake, no  distress   Resp:  Normal effort, on O2 via [place]  MSK:   Moves extremities without difficulty  Other:    Medical Decision Making  Medically screening exam initiated at 12:52 PM.  Appropriate orders placed.  [name] was informed that the remainder of the evaluation will be completed by another provider, this initial triage assessment does not replace that evaluation, and the importance of remaining in the ED until their evaluation is complete.     Marquette Old, PA-C 09/15/22 1256

## 2022-09-16 ENCOUNTER — Inpatient Hospital Stay: Payer: Medicaid Other

## 2022-09-16 ENCOUNTER — Emergency Department: Payer: Medicaid Other

## 2022-09-16 DIAGNOSIS — Z515 Encounter for palliative care: Secondary | ICD-10-CM | POA: Diagnosis not present

## 2022-09-16 DIAGNOSIS — J440 Chronic obstructive pulmonary disease with acute lower respiratory infection: Secondary | ICD-10-CM | POA: Diagnosis present

## 2022-09-16 DIAGNOSIS — M5442 Lumbago with sciatica, left side: Secondary | ICD-10-CM | POA: Diagnosis not present

## 2022-09-16 DIAGNOSIS — I5023 Acute on chronic systolic (congestive) heart failure: Secondary | ICD-10-CM | POA: Diagnosis not present

## 2022-09-16 DIAGNOSIS — I480 Paroxysmal atrial fibrillation: Secondary | ICD-10-CM

## 2022-09-16 DIAGNOSIS — D509 Iron deficiency anemia, unspecified: Secondary | ICD-10-CM | POA: Diagnosis present

## 2022-09-16 DIAGNOSIS — Y92239 Unspecified place in hospital as the place of occurrence of the external cause: Secondary | ICD-10-CM | POA: Diagnosis not present

## 2022-09-16 DIAGNOSIS — R1010 Upper abdominal pain, unspecified: Secondary | ICD-10-CM | POA: Diagnosis not present

## 2022-09-16 DIAGNOSIS — Z66 Do not resuscitate: Secondary | ICD-10-CM | POA: Diagnosis present

## 2022-09-16 DIAGNOSIS — E1122 Type 2 diabetes mellitus with diabetic chronic kidney disease: Secondary | ICD-10-CM | POA: Diagnosis present

## 2022-09-16 DIAGNOSIS — R109 Unspecified abdominal pain: Secondary | ICD-10-CM | POA: Diagnosis present

## 2022-09-16 DIAGNOSIS — Z7189 Other specified counseling: Secondary | ICD-10-CM | POA: Diagnosis not present

## 2022-09-16 DIAGNOSIS — I081 Rheumatic disorders of both mitral and tricuspid valves: Secondary | ICD-10-CM | POA: Diagnosis present

## 2022-09-16 DIAGNOSIS — I5043 Acute on chronic combined systolic (congestive) and diastolic (congestive) heart failure: Secondary | ICD-10-CM | POA: Diagnosis present

## 2022-09-16 DIAGNOSIS — N179 Acute kidney failure, unspecified: Secondary | ICD-10-CM | POA: Diagnosis present

## 2022-09-16 DIAGNOSIS — I2729 Other secondary pulmonary hypertension: Secondary | ICD-10-CM | POA: Diagnosis present

## 2022-09-16 DIAGNOSIS — K7200 Acute and subacute hepatic failure without coma: Secondary | ICD-10-CM | POA: Diagnosis present

## 2022-09-16 DIAGNOSIS — K810 Acute cholecystitis: Secondary | ICD-10-CM | POA: Diagnosis present

## 2022-09-16 DIAGNOSIS — G9341 Metabolic encephalopathy: Secondary | ICD-10-CM | POA: Diagnosis not present

## 2022-09-16 DIAGNOSIS — I4892 Unspecified atrial flutter: Secondary | ICD-10-CM | POA: Diagnosis present

## 2022-09-16 DIAGNOSIS — R7989 Other specified abnormal findings of blood chemistry: Secondary | ICD-10-CM | POA: Diagnosis not present

## 2022-09-16 DIAGNOSIS — I130 Hypertensive heart and chronic kidney disease with heart failure and stage 1 through stage 4 chronic kidney disease, or unspecified chronic kidney disease: Secondary | ICD-10-CM | POA: Diagnosis present

## 2022-09-16 DIAGNOSIS — R040 Epistaxis: Secondary | ICD-10-CM | POA: Diagnosis not present

## 2022-09-16 DIAGNOSIS — K766 Portal hypertension: Secondary | ICD-10-CM | POA: Diagnosis present

## 2022-09-16 DIAGNOSIS — D689 Coagulation defect, unspecified: Secondary | ICD-10-CM | POA: Diagnosis present

## 2022-09-16 DIAGNOSIS — I5032 Chronic diastolic (congestive) heart failure: Secondary | ICD-10-CM

## 2022-09-16 DIAGNOSIS — Z6841 Body Mass Index (BMI) 40.0 and over, adult: Secondary | ICD-10-CM | POA: Diagnosis not present

## 2022-09-16 DIAGNOSIS — K7682 Hepatic encephalopathy: Secondary | ICD-10-CM | POA: Diagnosis present

## 2022-09-16 DIAGNOSIS — D649 Anemia, unspecified: Secondary | ICD-10-CM | POA: Diagnosis not present

## 2022-09-16 DIAGNOSIS — J9612 Chronic respiratory failure with hypercapnia: Secondary | ICD-10-CM | POA: Diagnosis present

## 2022-09-16 DIAGNOSIS — J9611 Chronic respiratory failure with hypoxia: Secondary | ICD-10-CM | POA: Diagnosis present

## 2022-09-16 DIAGNOSIS — J189 Pneumonia, unspecified organism: Secondary | ICD-10-CM | POA: Diagnosis present

## 2022-09-16 DIAGNOSIS — R7401 Elevation of levels of liver transaminase levels: Secondary | ICD-10-CM | POA: Diagnosis not present

## 2022-09-16 DIAGNOSIS — F32A Depression, unspecified: Secondary | ICD-10-CM | POA: Diagnosis present

## 2022-09-16 DIAGNOSIS — K831 Obstruction of bile duct: Secondary | ICD-10-CM | POA: Diagnosis present

## 2022-09-16 LAB — URINE DRUG SCREEN, QUALITATIVE (ARMC ONLY)
Amphetamines, Ur Screen: NOT DETECTED
Barbiturates, Ur Screen: NOT DETECTED
Benzodiazepine, Ur Scrn: POSITIVE — AB
Cannabinoid 50 Ng, Ur ~~LOC~~: NOT DETECTED
Cocaine Metabolite,Ur ~~LOC~~: NOT DETECTED
MDMA (Ecstasy)Ur Screen: NOT DETECTED
Methadone Scn, Ur: NOT DETECTED
Opiate, Ur Screen: POSITIVE — AB
Phencyclidine (PCP) Ur S: NOT DETECTED
Tricyclic, Ur Screen: POSITIVE — AB

## 2022-09-16 LAB — COMPREHENSIVE METABOLIC PANEL
ALT: 35 U/L (ref 0–44)
AST: 51 U/L — ABNORMAL HIGH (ref 15–41)
Albumin: 2.7 g/dL — ABNORMAL LOW (ref 3.5–5.0)
Alkaline Phosphatase: 68 U/L (ref 38–126)
Anion gap: 10 (ref 5–15)
BUN: 50 mg/dL — ABNORMAL HIGH (ref 6–20)
CO2: 30 mmol/L (ref 22–32)
Calcium: 8.7 mg/dL — ABNORMAL LOW (ref 8.9–10.3)
Chloride: 99 mmol/L (ref 98–111)
Creatinine, Ser: 3.52 mg/dL — ABNORMAL HIGH (ref 0.44–1.00)
GFR, Estimated: 15 mL/min — ABNORMAL LOW (ref >60)
Glucose, Bld: 80 mg/dL (ref 70–99)
Potassium: 3.5 mmol/L (ref 3.5–5.1)
Sodium: 139 mmol/L (ref 135–145)
Total Bilirubin: 9.1 mg/dL — ABNORMAL HIGH (ref 0.3–1.2)
Total Protein: 6.4 g/dL — ABNORMAL LOW (ref 6.5–8.1)

## 2022-09-16 LAB — CBC
HCT: 32.8 % — ABNORMAL LOW (ref 36.0–46.0)
Hemoglobin: 9.2 g/dL — ABNORMAL LOW (ref 12.0–15.0)
MCH: 22 pg — ABNORMAL LOW (ref 26.0–34.0)
MCHC: 28 g/dL — ABNORMAL LOW (ref 30.0–36.0)
MCV: 78.3 fL — ABNORMAL LOW (ref 80.0–100.0)
Platelets: 244 10*3/uL (ref 150–400)
RBC: 4.19 MIL/uL (ref 3.87–5.11)
RDW: 23.6 % — ABNORMAL HIGH (ref 11.5–15.5)
WBC: 6.1 10*3/uL (ref 4.0–10.5)
nRBC: 0.8 % — ABNORMAL HIGH (ref 0.0–0.2)

## 2022-09-16 LAB — HEMOGLOBIN A1C
Hgb A1c MFr Bld: 4.5 % — ABNORMAL LOW (ref 4.8–5.6)
Mean Plasma Glucose: 82.45 mg/dL

## 2022-09-16 LAB — URINALYSIS, ROUTINE W REFLEX MICROSCOPIC
Glucose, UA: NEGATIVE mg/dL
Hgb urine dipstick: NEGATIVE
Ketones, ur: NEGATIVE mg/dL
Leukocytes,Ua: NEGATIVE
Nitrite: NEGATIVE
Protein, ur: 30 mg/dL — AB
Specific Gravity, Urine: 1.016 (ref 1.005–1.030)
pH: 5 (ref 5.0–8.0)

## 2022-09-16 LAB — HEPARIN LEVEL (UNFRACTIONATED)
Heparin Unfractionated: <0.1 IU/mL — ABNORMAL LOW (ref 0.30–0.70)
Heparin Unfractionated: 0.28 IU/mL — ABNORMAL LOW (ref 0.30–0.70)

## 2022-09-16 LAB — APTT
aPTT: 36 seconds (ref 24–36)
aPTT: 150 seconds — ABNORMAL HIGH (ref 24–36)

## 2022-09-16 LAB — PROTIME-INR
INR: 2 — ABNORMAL HIGH (ref 0.8–1.2)
Prothrombin Time: 22.4 seconds — ABNORMAL HIGH (ref 11.4–15.2)

## 2022-09-16 LAB — LACTIC ACID, PLASMA
Lactic Acid, Venous: 2 mmol/L (ref 0.5–1.9)
Lactic Acid, Venous: 1.7 mmol/L (ref 0.5–1.9)
Lactic Acid, Venous: 1.4 mmol/L (ref 0.5–1.9)

## 2022-09-16 LAB — BRAIN NATRIURETIC PEPTIDE: B Natriuretic Peptide: 715.5 pg/mL — ABNORMAL HIGH (ref 0.0–100.0)

## 2022-09-16 MED ORDER — ACETAMINOPHEN 650 MG RE SUPP: 650.0000 mg | Freq: Four times a day (QID) | RECTAL | Status: DC | PRN

## 2022-09-16 MED ORDER — METOPROLOL TARTRATE 5 MG/5ML IV SOLN
2.5000 mg | Freq: Once | INTRAVENOUS | Status: AC
  Administered 2022-09-16: 2.5 mg via INTRAVENOUS
  Filled 2022-09-16: qty 5

## 2022-09-16 MED ORDER — LACTATED RINGERS IV SOLN: INTRAVENOUS | Status: DC

## 2022-09-16 MED ORDER — SODIUM CHLORIDE 0.9 % IV SOLN: INTRAVENOUS | Status: DC

## 2022-09-16 MED ORDER — SODIUM CHLORIDE 0.9% FLUSH
3.0000 mL | Freq: Two times a day (BID) | INTRAVENOUS | Status: DC
  Administered 2022-09-16 – 2022-09-30 (×23): 3 mL via INTRAVENOUS

## 2022-09-16 MED ORDER — HEPARIN BOLUS VIA INFUSION
4000.0000 [IU] | Freq: Once | INTRAVENOUS | Status: AC
  Administered 2022-09-16: 4000 [IU] via INTRAVENOUS
  Filled 2022-09-16: qty 4000

## 2022-09-16 MED ORDER — ALBUTEROL SULFATE (2.5 MG/3ML) 0.083% IN NEBU: 2.5000 mg | INHALATION_SOLUTION | Freq: Four times a day (QID) | RESPIRATORY_TRACT | Status: DC | PRN

## 2022-09-16 MED ORDER — SODIUM CHLORIDE 0.9 % IV BOLUS
1000.0000 mL | Freq: Once | INTRAVENOUS | Status: AC
  Administered 2022-09-16: 1000 mL via INTRAVENOUS

## 2022-09-16 MED ORDER — FENTANYL CITRATE PF 50 MCG/ML IJ SOSY
25.0000 ug | PREFILLED_SYRINGE | Freq: Four times a day (QID) | INTRAMUSCULAR | Status: DC | PRN
  Administered 2022-09-16: 25 ug via INTRAVENOUS
  Filled 2022-09-16: qty 1

## 2022-09-16 MED ORDER — DOCUSATE SODIUM 100 MG PO CAPS
100.0000 mg | ORAL_CAPSULE | Freq: Two times a day (BID) | ORAL | Status: DC
  Administered 2022-09-16 – 2022-10-01 (×14): 100 mg via ORAL
  Filled 2022-09-16 (×21): qty 1

## 2022-09-16 MED ORDER — LORAZEPAM 2 MG/ML IJ SOLN
1.0000 mg | Freq: Once | INTRAMUSCULAR | Status: AC
  Administered 2022-09-16: 1 mg via INTRAVENOUS
  Filled 2022-09-16: qty 1

## 2022-09-16 MED ORDER — HYDROCODONE-ACETAMINOPHEN 5-325 MG PO TABS
1.0000 | ORAL_TABLET | ORAL | Status: DC | PRN
  Administered 2022-09-18 – 2022-09-20 (×9): 1 via ORAL
  Filled 2022-09-16 (×9): qty 1

## 2022-09-16 MED ORDER — HEPARIN (PORCINE) 25000 UT/250ML-% IV SOLN
1250.0000 [IU]/h | INTRAVENOUS | Status: DC
  Administered 2022-09-16: 1200 [IU]/h via INTRAVENOUS
  Administered 2022-09-17: 1250 [IU]/h via INTRAVENOUS
  Filled 2022-09-16 (×2): qty 250

## 2022-09-16 MED ORDER — IPRATROPIUM-ALBUTEROL 0.5-2.5 (3) MG/3ML IN SOLN: 3.0000 mL | Freq: Four times a day (QID) | RESPIRATORY_TRACT | Status: DC | PRN

## 2022-09-16 MED ORDER — ALBUTEROL SULFATE HFA 108 (90 BASE) MCG/ACT IN AERS: 1.0000 | INHALATION_SPRAY | Freq: Four times a day (QID) | RESPIRATORY_TRACT | Status: DC | PRN

## 2022-09-16 MED ORDER — SODIUM CHLORIDE 0.9 % IV SOLN: INTRAVENOUS | Status: AC

## 2022-09-16 MED ORDER — PROMETHAZINE HCL 25 MG PO TABS
12.5000 mg | ORAL_TABLET | Freq: Three times a day (TID) | ORAL | Status: DC | PRN
  Administered 2022-09-16: 12.5 mg via ORAL
  Filled 2022-09-16 (×2): qty 1

## 2022-09-16 MED ORDER — PANTOPRAZOLE SODIUM 40 MG IV SOLR
40.0000 mg | Freq: Two times a day (BID) | INTRAVENOUS | Status: DC
  Administered 2022-09-16 – 2022-09-29 (×28): 40 mg via INTRAVENOUS
  Filled 2022-09-16 (×28): qty 10

## 2022-09-16 MED ORDER — ALLOPURINOL 100 MG PO TABS
100.0000 mg | ORAL_TABLET | Freq: Every day | ORAL | Status: DC
  Administered 2022-09-16 – 2022-10-01 (×14): 100 mg via ORAL
  Filled 2022-09-16 (×16): qty 1

## 2022-09-16 MED ORDER — ACETAMINOPHEN 325 MG PO TABS: 650.0000 mg | ORAL_TABLET | Freq: Four times a day (QID) | ORAL | Status: DC | PRN

## 2022-09-16 MED ORDER — RIVAROXABAN 20 MG PO TABS: 20.0000 mg | ORAL_TABLET | Freq: Every day | ORAL | Status: DC

## 2022-09-16 MED ORDER — NITROGLYCERIN 0.4 MG SL SUBL: 0.4000 mg | SUBLINGUAL_TABLET | SUBLINGUAL | Status: DC | PRN

## 2022-09-16 MED ORDER — FENTANYL CITRATE PF 50 MCG/ML IJ SOSY
25.0000 ug | PREFILLED_SYRINGE | INTRAMUSCULAR | Status: DC | PRN
  Administered 2022-09-16 – 2022-09-18 (×8): 25 ug via INTRAVENOUS
  Filled 2022-09-16 (×8): qty 1

## 2022-09-16 NOTE — Consult Note (Signed)
ANTICOAGULATION CONSULT NOTE - Initial Consult  Pharmacy Consult for Heparin infusion Indication: atrial fibrillation  Allergies  Allergen Reactions   Amiodarone Nausea And Vomiting   Aspirin Swelling   Flexeril [Cyclobenzaprine] Swelling   Trazamine [Trazodone & Diet Manage Prod] Nausea And Vomiting   Codeine Rash   Tramadol Rash    Patient Measurements: Height: '5\' 4"'$  (162.6 cm) Weight: 120.2 kg (264 lb 15.9 oz) IBW/kg (Calculated) : 54.7 Heparin Dosing Weight: 83.9 kg  Vital Signs: Temp: 98.6 F (37 C) (10/18 1906) Temp Source: Oral (10/18 1906) BP: 97/68 (10/18 1542) Pulse Rate: 106 (10/18 1542)  Labs: Recent Labs    09/15/22 1259 09/16/22 0325 09/16/22 0942 09/16/22 1919  HGB 9.9* 9.2*  --   --   HCT 35.6* 32.8*  --   --   PLT 269 244  --   --   APTT  --   --  36 150*  LABPROT  --   --  22.4*  --   INR  --   --  2.0*  --   HEPARINUNFRC  --   --  <0.10* 0.28*  CREATININE 3.54* 3.52*  --   --      Estimated Creatinine Clearance: 23.1 mL/min (A) (by C-G formula based on SCr of 3.52 mg/dL (H)).   Medical History: Past Medical History:  Diagnosis Date   Acute drug-induced gout of left foot 03/01/2018   Last Assessment & Plan:  Likely at least partially brought on by diuresis.  Needs to continue to diurese  Will push hydration Stop allopurinol given initiation during acute flare may worsen this, re-broach this when asymptomatic Avoid nsaids given stomach pain Trial colchicine Add acetaminophen Ice, elevate, rest   Allergy    seasonal   Anxiety    Arthritis    Right Knee   Asthma    CHF (congestive heart failure) (HCC)    COPD (chronic obstructive pulmonary disease) (HCC)    Coronary artery disease    Leaky heart valve   Diabetes mellitus without complication (HCC)    Dysrhythmia    Fibromyalgia    GERD (gastroesophageal reflux disease)    Hypertension    Pneumonia    PUD (peptic ulcer disease)    Pulmonary HTN (HCC)    Rheumatic fever/heart  disease    Sleep apnea     Medications:  Patient on Xarelto '20mg'$  prior to admission (dx A.fib)  Assessment: Patient with PMH of HF, COPD, CAD, DM, HTN, and Afib (on Xarelto) presents to ED with RUQ / Rt flank/ Rt side of abdomen involved. Pharmacy consulted to manage heparin infusion during inpatient stay. Potential surgical intervention needed.  Baseline aPTT, INR and HL ordered and collceted.  Hgb and plt appropriate to start heparin therapy. No emergent surgery needed. Noted AKI on admission.  Last Xarleto dose 09/14/2022 per patient history  Baseline HL <0.10,  aPTT 36   INR 2.0  10/18 1919   HL 0.28/aPTT 150   HL subtherapeutic, aPTT supratherapeutic  does not appear to correlate    Goal of Therapy:  Heparin level 0.3-0.7 units/ml aPTT 66-102 seconds Monitor platelets by anticoagulation protocol: Yes   Plan:  10/18 1919   HL 0.28/aPTT 150   HL subtherapeutic, aPTT supratherapeutic  not correlating but HL level was <0.10 at baseline which would seem to indicate Xarelto not affecting HL (not in patient system/pt not taking?) Will slightly adjust heparin drip based on HL to 1250 units and recheck HL and aPTT in 6  hrs this time to see about correlation. CBC daily  Chinita Greenland PharmD Clinical Pharmacist 09/16/2022

## 2022-09-16 NOTE — Assessment & Plan Note (Addendum)
Pt c/o right sided abd pain for one month.  Work-up so far with MRCP shows biliary sludge, mildly edematous gallbladder, no biliary tract dilation to suggest obstruction.  No choledocholithiasis. 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  T. bili remained stable around 9.1, -Switch Norco with immediate release oxycodone.

## 2022-09-16 NOTE — ED Notes (Signed)
Provider Kateri Plummer, NP made aware of pt's heart in 120's. Repeat EKG done and provider made aware.

## 2022-09-16 NOTE — ED Notes (Signed)
Hospitalist at bedside verbal order received to give the pt a liter fluid bolus due to her bp at 86/76, then lactated ringers, pt is alert and oriented and requesting something for pain, pt informed by this RN and the Dr that at this time due to her bp pain medication would lower it further

## 2022-09-16 NOTE — Assessment & Plan Note (Addendum)
Blood pressure remains soft which interfere with necessary medical management which include diuretics and Cardizem for rate control. She was started on midodrine with some improvement. -Continue with midodrine -Monitor closely when she is being diuresed with Lasix and getting Cardizem, amiodarone allergies were noted.

## 2022-09-16 NOTE — Progress Notes (Signed)
       CROSS COVER NOTE  NAME: Valeda Corzine MRN: [id number] DOB : 1966-10-06 ATTENDING PHYSICIAN: '@ATTENDING'$ @    Date of Service   09/16/2022   HPI/Events of Note   Notified of progressive tachycardia today, HR 128. EKG obtained and shows AFIB w RVR HR 122. BP is currently 101/79 and has been soft.  Interventions   Assessment/Plan:  2.5 mg IV metoprolol x1 PRN metoprolol for Sustained HR >120; hold for SBP <90     This document was prepared using Dragon voice recognition software and may include unintentional dictation errors.  Neomia Glass DNP, MBA, FNP-BC Nurse Practitioner [facility] Pager [phone]

## 2022-09-16 NOTE — Assessment & Plan Note (Addendum)
Pt has h/o IDA / is currently anemic and is on xarelto for a/fib. In afib rvr on ekg.  Pt being followed by Dr.vanga for her IDA and Eval for GI loss. GI eval showed: Endoscopy Shows Studies Complaint Capsule History Speak, Normal Small Bowel Transit, Scattered Small Bowel Lymphangiectasia's, No Source of Iron Deficiency Anemia Identified - 08/18/2022. Endoscopy shows erythematous  mucosa in the antrum, normal duodenal bulb and second portion of duodenum, colonoscopy showed melanosis coli but was otherwise normal 06/29/2022.  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 Currently hemoglobin stable. Can-continue to monitor -Continue with iron supplement-restarting home meds.

## 2022-09-16 NOTE — Assessment & Plan Note (Addendum)
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  Liver enzymes improved with stable T. bili at 9.1. GI and general surgery signed off. Patient is being diuresed. -Continue to monitor

## 2022-09-16 NOTE — ED Notes (Signed)
Pt denies any chest pain at this time. Pt c/o pain in her abdomen.

## 2022-09-16 NOTE — Assessment & Plan Note (Addendum)
Remained in A-fib with going in and out of RVR. -Continue with Cardizem, beta-blocker and Xarelto

## 2022-09-16 NOTE — ED Notes (Signed)
Patient transported to MRI 

## 2022-09-16 NOTE — Consult Note (Signed)
[facility] SURGICAL CONSULTATION NOTE (initial) - cpt: 59563   HISTORY OF PRESENT ILLNESS (HPI):  56 y.o. female presented to [facility] ED yesterday for evaluation of nausea and emesis. Patient reports that over the last month she has felt nauseous almost every single day and endorses episodes of emesis at least 3x weekly. Additionally having abdominal pain but this seems to be rather diffuse and non-specific. She has not been able to tolerate any PO intake and feels quite dehydrated. No reported fever, chills. She is jaundiced. No previous intra-abdominal surgeries. She has a very significant PMHx including COPD on chronic O2, CHF, pulmonary HTN, DM. Work up in the ED revealed a normal WBC to 6.7K, Hgb seems to be baseline at 9.9, AKI with sCr - 3.54, No significant electrolyte derangements, she was found to have quite significant hyperbilirubinemia to 9.6 (now 9.1 this morning), she was also found to have mild lactic acidosis to 2.0 (now normalized 1.4). she underwent RUQ [place] which showed sludge vs stone in GB with mild thickening and fluid. Given the hyperbilirubinemia she underwent MRCP which again showed similar changes to the gallbladder and there was no evidence of choledocholithiasis.   Surgery is consulted by emergency medicine physician Dr. Harvest Dark, MD in this context for evaluation and management of possible cholecystitis in setting of hyperbilirubinemia.  PAST MEDICAL HISTORY (PMH):  Past Medical History:  Diagnosis Date   Acute drug-induced gout of left foot 03/01/2018   Last Assessment & Plan:  Likely at least partially brought on by diuresis.  Needs to continue to diurese  Will push hydration Stop allopurinol given initiation during acute flare may worsen this, re-broach this when asymptomatic Avoid nsaids given stomach pain Trial colchicine Add acetaminophen Ice, elevate, rest   Allergy    seasonal   Anxiety    Arthritis    Right Knee   Asthma    CHF  (congestive heart failure) (HCC)    COPD (chronic obstructive pulmonary disease) (HCC)    Coronary artery disease    Leaky heart valve   Diabetes mellitus without complication (HCC)    Dysrhythmia    Fibromyalgia    GERD (gastroesophageal reflux disease)    Hypertension    Pneumonia    PUD (peptic ulcer disease)    Pulmonary HTN (HCC)    Rheumatic fever/heart disease    Sleep apnea      PAST SURGICAL HISTORY ([place]):  Past Surgical History:  Procedure Laterality Date   ADENOIDECTOMY     COLONOSCOPY WITH PROPOFOL N/A 11/06/2019   Procedure: COLONOSCOPY WITH PROPOFOL;  Surgeon: Virgel Manifold, MD;  Location: [facility] ENDOSCOPY;  Service: Endoscopy;  Laterality: N/A;  2 day prep    COLONOSCOPY WITH PROPOFOL N/A 06/29/2022   Procedure: COLONOSCOPY WITH PROPOFOL;  Surgeon: Lin Landsman, MD;  Location: [facility] ENDOSCOPY;  Service: Gastroenterology;  Laterality: N/A;   CORONARY ARTERY BYPASS GRAFT     June 27 2018   ESOPHAGOGASTRODUODENOSCOPY (EGD) WITH PROPOFOL N/A 05/25/2018   Procedure: ESOPHAGOGASTRODUODENOSCOPY (EGD) WITH PROPOFOL;  Surgeon: Lucilla Lame, MD;  Location: [facility] ENDOSCOPY;  Service: Endoscopy;  Laterality: N/A;   ESOPHAGOGASTRODUODENOSCOPY (EGD) WITH PROPOFOL N/A 11/06/2019   Procedure: ESOPHAGOGASTRODUODENOSCOPY (EGD) WITH PROPOFOL;  Surgeon: Virgel Manifold, MD;  Location: [facility] ENDOSCOPY;  Service: Endoscopy;  Laterality: N/A;   ESOPHAGOGASTRODUODENOSCOPY (EGD) WITH PROPOFOL N/A 06/29/2022   Procedure: ESOPHAGOGASTRODUODENOSCOPY (EGD) WITH PROPOFOL;  Surgeon: Lin Landsman, MD;  Location: [facility] ENDOSCOPY;  Service: Gastroenterology;  Laterality: N/A;   GIVENS CAPSULE  STUDY N/A 07/22/2022   Procedure: GIVENS CAPSULE STUDY;  Surgeon: Lin Landsman, MD;  Location: [facility] ENDOSCOPY;  Service: Gastroenterology;  Laterality: N/A;   MITRAL VALVE REPLACEMENT     MULTIPLE TOOTH EXTRACTIONS     TONSILLECTOMY     TOTAL KNEE ARTHROPLASTY Right 09/04/2016   Procedure:  TOTAL KNEE ARTHROPLASTY; with lateral release;  Surgeon: Earlie Server, MD;  Location: [place];  Service: Orthopedics;  Laterality: Right;     MEDICATIONS:  Prior to Admission medications   Medication Sig Start Date End Date Taking? Authorizing Provider  albuterol (VENTOLIN HFA) 108 (90 Base) MCG/ACT inhaler Inhale 1-2 puffs into the lungs every 6 (six) hours as needed for wheezing or shortness of breath.    [provider]  allopurinol (ZYLOPRIM) 100 MG tablet Take 1 tablet (100 mg total) by mouth 2 (two) times daily. 03/05/20   Milinda Pointer, MD  butalbital-acetaminophen-caffeine (FIORICET, ESGIC) [phone] MG tablet Take 1 tablet by mouth every 6 (six) hours as needed.  Patient not taking: Reported on 12/18/2021    [provider]  Cholecalciferol (D 5000) 125 MCG (5000 UT) capsule Take by mouth daily. 11/14/18   [provider]  Cholecalciferol (VITAMIN D) 50 MCG (2000 UT) CAPS Take 1 capsule (2,000 Units total) by mouth daily. 06/19/21   Alisa Graff, FNP  colchicine 0.6 MG tablet Take 1 tablet (0.6 mg total) by mouth daily. As directed 03/05/20   Milinda Pointer, MD  docusate sodium (COLACE) 100 MG capsule Take 100 mg by mouth 2 (two) times daily.    [provider]  empagliflozin (JARDIANCE) 10 MG TABS tablet Take 10 mg by mouth daily.    [provider]  Fluticasone-Umeclidin-Vilant (TRELEGY ELLIPTA) 100-62.5-25 MCG/INH AEPB Inhale 1 puff into the lungs daily.    [provider]  furosemide (LASIX) 40 MG tablet TAKE 2 TABLETS BY MOUTH IN THE MORNING AND 1 TABLET AT NIGHT Patient taking differently: 40 mg 2 (two) times daily. 04/23/20   Alisa Graff, FNP  gabapentin (NEURONTIN) 600 MG tablet Take 0.5 tablets (300 mg total) by mouth 2 (two) times daily. Patient taking differently: Take 600 mg by mouth 3 (three) times daily. 12/06/18   Vaughan Basta, MD  ipratropium-albuterol (DUONEB) 0.5-2.5 (3) MG/3ML SOLN INHALE THE  CONTENTS OF 1 VIAL(3MLS) VIA NEBULIZER 3 TIMES DAILY AS NEEDED 10/24/19   Carles Collet M, PA-C  Iron-FA-B Cmp-C-Biot-Probiotic (FUSION PLUS) CAPS Take 1 capsule by mouth daily. 06/25/22   Lin Landsman, MD  meclizine (ANTIVERT) 12.5 MG tablet TAKE 2 TABLETS (25 MG TOTAL) BY MOUTH 3 TIMES DAILY AS NEEDED FOR DIZZINESS 11/17/19   Trinna Post, PA-C  metolazone (ZAROXOLYN) 2.5 MG tablet Take 2.5 mg by mouth. Once a week; additional dose if needed    [provider]  metoprolol tartrate (LOPRESSOR) 100 MG tablet Take 1 tablet (100 mg total) by mouth 2 (two) times daily. 12/21/18   Alisa Graff, FNP  nitroGLYCERIN (NITROSTAT) 0.4 MG SL tablet Place 1 tablet (0.4 mg total) under the tongue every 5 (five) minutes as needed for chest pain. 06/19/21   Alisa Graff, FNP  nortriptyline (PAMELOR) 10 MG capsule Take 20 mg by mouth at bedtime.     [provider]  omeprazole (PRILOSEC) 20 MG capsule TAKE 2 CAPSULES BY MOUTH TWICE A DAY BEFORE A MEAL 03/06/20   Carles Collet M, PA-C  oxyCODONE-acetaminophen (PERCOCET/ROXICET) 5-325 MG tablet Take 1 tablet by mouth every 4 (four) hours as  needed for severe pain. 11/03/20   Paulette Blanch, MD  OXYGEN Inhale 4 L/L into the lungs.    [provider]  potassium chloride (KLOR-CON) 10 MEQ tablet Take 4 tablets (40 mEq total) by mouth 2 (two) times daily. And additional 73mq on metolazone days 01/05/22   HAlisa Graff FNP  promethazine (PHENERGAN) 12.5 MG tablet Take 1 tablet (12.5 mg total) by mouth every 8 (eight) hours as needed for nausea or vomiting. 02/20/20   Bacigalupo, ADionne Bucy MD  rivaroxaban (XARELTO) 20 MG TABS tablet Take 20 mg by mouth daily with supper.    [provider]  zolpidem (AMBIEN) 5 MG tablet Take 5 mg by mouth at bedtime as needed for sleep.    [provider]     ALLERGIES:  Allergies  Allergen Reactions   Amiodarone Nausea And Vomiting   Aspirin Swelling   Flexeril  [Cyclobenzaprine] Swelling   Trazamine [Trazodone & Diet Manage Prod] Nausea And Vomiting   Codeine Rash   Tramadol Rash     SOCIAL HISTORY:  Social History   Socioeconomic History   Marital status: Married    Spouse name: Not on file   Number of children: Not on file   Years of education: Not on file   Highest education level: Not on file  Occupational History   Occupation: disabled  Tobacco Use   Smoking status: Every Day    Packs/day: 0.10    Types: Cigarettes   Smokeless tobacco: Never   Tobacco comments:    4/20 was not able to quit.  Still at 1-3 a day. She would like to wait to set a new quit date until we get back to class to have the extra in person support  Vaping Use   Vaping Use: Never used  Substance and Sexual Activity   Alcohol use: Never    Alcohol/week: 3.0 standard drinks of alcohol    Types: 3 Cans of beer per week    Comment: 16 oz per week   Drug use: Not Currently    Comment: Previous use of cocaine and marijuana last use 07/31/16    Sexual activity: Not Currently  Other Topics Concern   Not on file  Social History Narrative   Not on file   Social Determinants of Health   Financial Resource Strain: Low Risk  (10/02/2019)   Overall Financial Resource Strain (CARDIA)    Difficulty of Paying Living Expenses: Not hard at all  Food Insecurity: No Food Insecurity (03/19/2020)   Hunger Vital Sign    Worried About Running Out of Food in the Last Year: Never true    Ran Out of Food in the Last Year: Never true  Transportation Needs: Unmet Transportation Needs (03/19/2020)   PRAPARE - Transportation    Lack of Transportation (Medical): No    Lack of Transportation (Non-Medical): Yes  Physical Activity: Insufficiently Active (10/02/2019)   Exercise Vital Sign    Days of Exercise per Week: 7 days    Minutes of Exercise per Session: 20 min  Stress: No Stress Concern Present (10/02/2019)   [employer] [place] [place]   Feeling of Stress : Not at all  Social Connections: Moderately Integrated (10/02/2019)   Social Connection and Isolation Panel [NHANES]    Frequency of Communication with Friends and Family: More than three times a week    Frequency of Social Gatherings with Friends and Family: More than three times a week  Attends Religious Services: 1 to 4 times per year    Active Member of Clubs or Organizations: Yes    Attends Archivist Meetings: 1 to 4 times per year    Marital Status: Never married  Intimate Partner Violence: Not At Risk (10/02/2019)   Humiliation, Afraid, Rape, and Kick questionnaire    Fear of Current or Ex-Partner: No    Emotionally Abused: No    Physically Abused: No    Sexually Abused: No     FAMILY HISTORY:  Family History  Problem Relation Age of Onset   COPD Mother    Cancer Mother        Bone   Asthma Mother    Rheum arthritis Mother    Congestive Heart Failure Father    Breast cancer Maternal Aunt       REVIEW OF SYSTEMS:  Review of Systems  Constitutional:  Positive for malaise/fatigue. Negative for chills and fever.       + Decreased Appetite   Respiratory:  Negative for cough and shortness of breath.   Cardiovascular:  Negative for chest pain and palpitations.  Gastrointestinal:  Positive for abdominal pain, nausea and vomiting. Negative for constipation and diarrhea.  Genitourinary:  Negative for dysuria and urgency.  Neurological:  Positive for weakness. Negative for dizziness and headaches.  All other systems reviewed and are negative.   VITAL SIGNS:  Temp:  [97.4 F (36.3 C)-97.9 F (36.6 C)] 97.9 F (36.6 C) (10/18 0400) Pulse Rate:  [94-104] 100 (10/18 0600) Resp:  [16-18] 16 (10/18 0600) BP: (91-117)/(55-72) 91/62 (10/18 0600) SpO2:  [94 %-100 %] 100 % (10/18 0600) Weight:  [120.2 kg] 120.2 kg (10/17 1257)     Height: '5\' 4"'$  (162.6 cm) Weight: 120.2 kg BMI (Calculated): 45.46   INTAKE/OUTPUT:  No intake/output  data recorded.  PHYSICAL EXAM:  Physical Exam Vitals and nursing note reviewed. Exam conducted with a chaperone present.  Constitutional:      General: She is not in acute distress.    Appearance: Normal appearance. She is obese.  HENT:     Head: Normocephalic and atraumatic.  Cardiovascular:     Rate and Rhythm: Tachycardia present. Rhythm irregular.  Pulmonary:     Effort: Pulmonary effort is normal. No respiratory distress.     Comments: On [place] Abdominal:     General: There is no distension.     Palpations: Abdomen is soft.     Tenderness: There is generalized abdominal tenderness. There is no guarding or rebound.     Comments: Abdomen is soft, she is tender somewhat throughout her abdomen, seems worse periumbilically, non-distended, no rebound/guarding  Genitourinary:    Comments: Deferred Skin:    General: Skin is warm and dry.     Coloration: Skin is jaundiced. Skin is not pale.  Neurological:     General: No focal deficit present.     Mental Status: She is alert and oriented to person, place, and time.  Psychiatric:        Mood and Affect: Mood normal.      Labs:     Latest Ref Rng & Units 09/16/2022    3:25 AM 09/15/2022   12:59 PM 11/02/2020    3:56 PM  CBC  WBC 4.0 - 10.5 K/uL 6.1  6.7  7.3   Hemoglobin 12.0 - 15.0 g/dL 9.2  9.9  9.8   Hematocrit 36.0 - 46.0 % 32.8  35.6  34.3   Platelets 150 - 400 K/uL 244  269  234       Latest Ref Rng & Units 09/16/2022    3:25 AM 09/15/2022   12:59 PM 01/02/2022    1:45 PM  CMP  Glucose 70 - 99 mg/dL 80  104  138   BUN 6 - 20 mg/dL 50  47  8   Creatinine 0.44 - 1.00 mg/dL 3.52  3.54  0.78   Sodium 135 - 145 mmol/L 139  137  137   Potassium 3.5 - 5.1 mmol/L 3.5  3.7  3.3   Chloride 98 - 111 mmol/L 99  93  90   CO2 22 - 32 mmol/L 30  27  36   Calcium 8.9 - 10.3 mg/dL 8.7  9.0  9.0   Total Protein 6.5 - 8.1 g/dL 6.4  7.4    Total Bilirubin 0.3 - 1.2 mg/dL 9.1  9.6    Alkaline Phos 38 - 126 U/L 68  80    AST 15 -  41 U/L 51  57    ALT 0 - 44 U/L 35  35       Imaging studies:   RUQ [place] (09/15/2022) personally reviewed which showed cholelithiasis vs sludge, and radiologist report reviewed:  IMPRESSION: 1. Findings compatible with acute cholecystitis. 2. 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   MRCP (09/16/2022) personally reviewed and also reviewed IR MD, overall picture appears more consistent with cholestasis, no choledocholithiasis, significantly dilated right atrium and portal venous system, and radiologist report reviewed below:  IMPRESSION: 1. Biliary sludge lying dependently in the gallbladder. Gallbladder wall may be mildly edematous, but is poorly evaluated on today's motion limited examination. If there is any clinical concern for acute cholecystitis, further evaluation with right upper quadrant abdominal ultrasound or nuclear medicine hepatobiliary scan should be considered at this time. 2. No biliary tract dilatation to suggest obstruction. No evidence of choledocholithiasis. 3. Severe cardiomegaly.   Assessment/Plan: (ICD-10's: E80.6) 56 y.o. female with about 1 month of intractable nausea, emesis, and poor PO intake found to have significant hyperbilirubinemia and cholelithiasis without evidence of choledocholithiasis nor cholecystitis, complicated by significant comorbid disease including COPD on chronic O2, CHF, pulmonary HTN, DM   - Overall clinically picture is not clear cut. Case and images reviewed this morning along side interventional radiology. This does not overtly appear to be cholecystitis. There is likely a component of cholestasis. However, there is significant dilation in the right atrium and in the portal venous system concerning for significant right heart failure and likely hyperbilirubinemia is resultant of possible liver failure vs hepatitis. Again, there is no evidence of choledocholithiasis nor bilary  obstruction by imaging. IR does NOT recommend percutaneous cholecystostomy tube as this is likely not cholecystitis at this time.   - No emergent surgical interventions - She will benefit from GI consultation given concern for liver failure, hepatitis panel, INR - Likely also benefit from cardiology consultation, echocardiogram given concerns for right heart failure    - Would continue NPO for now - Continue IVF resuscitation; Monitor AKI  - Monitor abdominal examination; on-going bowel function - Pain control prn; antiemetics prn   - Further management at this time.   All of the above findings and recommendations were discussed with the patient, and all of patient's questions were answered to her expressed satisfaction.  Thank you for the opportunity to participate in this patient's care.   -- Edison Simon, PA-C [facility] 09/16/2022, 7:37 AM M-F: [facility] -  4pm

## 2022-09-16 NOTE — Assessment & Plan Note (Addendum)
UDS done on 09/16/2022 were positive for benzodiazepines, opioids and tricyclic.

## 2022-09-16 NOTE — Assessment & Plan Note (Addendum)
-  Nicotine patch 

## 2022-09-16 NOTE — Consult Note (Addendum)
ANTICOAGULATION CONSULT NOTE - Initial Consult  Pharmacy Consult for Heparin infusion Indication: atrial fibrillation  Allergies  Allergen Reactions   Amiodarone Nausea And Vomiting   Aspirin Swelling   Flexeril [Cyclobenzaprine] Swelling   Trazamine [Trazodone & Diet Manage Prod] Nausea And Vomiting   Codeine Rash   Tramadol Rash    Patient Measurements: Height: '5\' 4"'$  (162.6 cm) Weight: 120.2 kg (264 lb 15.9 oz) IBW/kg (Calculated) : 54.7 Heparin Dosing Weight: 83.9 kg  Vital Signs: Temp: 97.9 F (36.6 C) (10/18 0400) Temp Source: Oral (10/18 0400) BP: 91/62 (10/18 0600) Pulse Rate: 100 (10/18 0600)  Labs: Recent Labs    09/15/22 1259 09/16/22 0325  HGB 9.9* 9.2*  HCT 35.6* 32.8*  PLT 269 244  CREATININE 3.54* 3.52*    Estimated Creatinine Clearance: 23.1 mL/min (A) (by C-G formula based on SCr of 3.52 mg/dL (H)).   Medical History: Past Medical History:  Diagnosis Date   Acute drug-induced gout of left foot 03/01/2018   Last Assessment & Plan:  Likely at least partially brought on by diuresis.  Needs to continue to diurese  Will push hydration Stop allopurinol given initiation during acute flare may worsen this, re-broach this when asymptomatic Avoid nsaids given stomach pain Trial colchicine Add acetaminophen Ice, elevate, rest   Allergy    seasonal   Anxiety    Arthritis    Right Knee   Asthma    CHF (congestive heart failure) (HCC)    COPD (chronic obstructive pulmonary disease) (HCC)    Coronary artery disease    Leaky heart valve   Diabetes mellitus without complication (HCC)    Dysrhythmia    Fibromyalgia    GERD (gastroesophageal reflux disease)    Hypertension    Pneumonia    PUD (peptic ulcer disease)    Pulmonary HTN (HCC)    Rheumatic fever/heart disease    Sleep apnea     Medications:  Patient on Xarelto '20mg'$  prior to admission (dx A.fib)  Assessment: Patient with PMH of HF, COPD, CAD, DM, HTN, and Afib (on Xarelto) presents to ED  with RUQ / Rt flank/ Rt side of abdomen involved. Pharmacy consulted to manage heparin infusion during inpatient stay. Potential surgical intervention needed.  Baseline aPTT, INR and HL ordered and collceted.  Hgb and plt appropriate to start heparin therapy. No emergent surgery needed. Noted AKI on admission.  Last Xarleto dose 09/14/2022 per patient history  Goal of Therapy:  Heparin level 0.3-0.7 units/ml aPTT 66-102 seconds Monitor platelets by anticoagulation protocol: Yes   Plan:  Give 4000 units bolus x 1 Start heparin infusion at 1200 units/hr Check anti-Xa level in 8 hours and daily while on heparin Continue to monitor H&H and platelets  Chelsea Ramsey PharmD, BCPS 09/16/2022 8:39 AM

## 2022-09-16 NOTE — Hospital Course (Addendum)
Taken from prior notes.  Patient is 56 year old female with DM, pulm HTN, COPD, CHF, GERD, OSA was seen by PCP on day of admission for not feeling well and abd pain for about a month. Patient complained of pain in RUQ and right flank with nausea.   abnormal outpatient labs showing AKI.   In ED, her labwork confirmed AKI with Cr 3.54, total bilirubin of 9.6.  AST mildly elevated to 57, ALT 35, Alk Phos 80.  U/S was done which showed gallbladder wall thickening to 5 mm with pericholecystic fluid, suspicious for acute cholecystitis.  CBD per U/S was 3 mm.   Surgery was consulted, recommended MRCP - MRCP showed biliary sludge, GB mild edematous. No biliary tract dilation to suggest obstruction.  No choledocholithiasis. 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  Also found to have acute systolic on chronic diastolic heart failure.  Echocardiogram shows EF of 20 to 25% with grade 3 diastolic dysfunction, right ventricular pressure and volume overload.  Prior echo done in December 2019 with a EF of 50 to 55%.  She was started on IV diuresis.  As blood pressure remained soft which interfere with other medical management, she was started on midodrine.  Remained in A-fib with intermittently going in RVR.  Missed Cardizem doses yesterday due to softer blood pressure.  Patient was on heparin infusion for the past 5 days, do not see any plan for cardiac cath by cardiology.  No clear recommendations. CMP remained with elevated T. bili at 9.1, she continued to have upper abdominal pain. Concern of cardiohepatic with hepatic congestion secondary to heart failure. Secure chat with Dr. Humphrey Rolls and we will discontinue heparin infusion today and restart her  home Xarelto.  Patient will get benefit from ischemic work-up with this new HFrEF. Norco switched with oxycodone immediate release to see if that will better serve her pain. No recorded urinary output as she was receiving IV diuresis-message sent to nursing staff. Potassium at 3.1 which is being repleted.  Renal functions continue to improve slowly.  10/23: Discussed with Dr. Nehemiah Massed on chat, there is no need for immediate ischemic work-up. T. bili started improving, at 8.5 today.  Rest of the labs improved.  Renal function stable. Pain with better control after changing to oxycodone.  Awaiting disposition.  10/24: Patient became more lethargic and somnolent in the evening, ABG and ammonia levels were checked and found to have CO2 of 59 and ammonia of 135.  Had an episode of self-limiting epistaxis followed by coughing up some blood overnight.  Patient is on Xarelto for atrial flutter, which is being held. Lactulose enema was added as she is unable to take much p.o. Remained very lethargic and somnolent this morning.  CMP with improvement in creatinine, T. bili at 8.8 with INR of 3.6.  Concern of hepatic failure, another message sent to GI.  Recent liver ultrasound with hepatic steatosis and portal hypertension, no cirrhosis.  VBG was mostly within normal limit except mildly elevated bicarb. Repeat ammonia levels with some improvement to 65. Currently unable to swallow as pocketing in her mouth, able to answer orientation questions appropriately but response was very slow. Palliative care consult was also placed.  10/25: Patient little more alert and oriented.  Had multiple bowel movements with lactulose enema yesterday,  ammonia at 49.  Worsening BNP at 12/07/2001 today, significant hypokalemia with potassium of 2.2, which is being repleted.  Magnesium at 1.9.  Worsening of T. bili at 9.8 And some improvement of creatinine to 1.25.  Cardiology ordered another limited echocardiogram for reevaluation  of her volume status and heart failure. GI started her on rifaximin.  She also received vitamin K with some improvement of INR to 2.5 today.  Patient will remain very high risk for deterioration and mortality.  Currently DNR and palliative team is following.    10/31.  Discontinued Foley for voiding trial.  Switching heparin drip over to Xarelto lower dose.  Switching Lasix from IV to oral.  TOC working on delivering equipment to home.

## 2022-09-16 NOTE — ED Notes (Signed)
Surgeon at bedside.  

## 2022-09-16 NOTE — Consult Note (Signed)
GI Inpatient Consult Note  Reason for Consult: Hyperbilirubinemia, right-sided abdominal pain    Attending Requesting Consult: Dr. Estill Cotta, MD  History of Present Illness: Chelsea Ramsey is a 56 y.o. female seen for evaluation of hyperbilirubinemia and right-sided abdominal pain at the request of hospitalist - Dr. Estill Cotta. Patient has a PMH of HTN, COPD on chronic O2 supplementation, T2DM, CHF, PAF on chronic anticoagulation s/p ablation, hx of mitral valve replacement 05/2018, GERD, and OSA. She presented to the [facility] ED yesterday afternoon for chief complaint of abnormal labs from primary care physician's office which showed AKI and hyperbilirubinemia. She complains of 6-monthhistory of poor oral intake, decreased appetite, weakness, and intractable nausea with associated abdominal pain. It is hard for her to describe the abdominal pain and reports "it hurts all over." Pain is daily and hard for her to pinpoint any clear alleviating or aggravating factors. Work-up in the ED revealed AKI with serum creatinine 3.54, BUN 50, total bilirubin 9.6, AST 57, ALT 35, alk phos 68, and BNP 715. RUQ [place] showed sludge versus stone in GB with mild wall thickening and pericholcystic fluid. MRCP commented on biliary sludge lying dependently in gallbladder with mildly edematous gallbladder wall. There was no evidence of choledocholithiasis. Per radiology, there was significant dilated right atrium and portal venous system. GI was consulted for further evaluation.   Patient seen and examined in ED this afternoon. She is present with her mother. She reports she continues to have abdominal pain. She reports pain is all over her abdomen and can't pinpoint where it hurts worse. She is very concerned about the possibility of dying and is tearful in our encounter. She reports the past month has been very difficult for her because she has lost her taste for food and had poor PO intake. She hasn't been  eating a lot because she doesn't have the appetite. She cannot tell me if pain is made worse or better with food. She denies any fevers, chills, chest pain, or shortness of breath. She did have work-up for unexplained IDA with Dr. VMarius Ditchwithin the last three months with endoscopy, colonoscopy, and video capsule endoscopy. She had no identifiable causes of iron-deficiency anemia. VCE commented on scattered small bowel lymphangiectasias. She denies any raw seafood consumption. She denies any sick contacts or recent foreign travel. Her PCP put her on Jardiance and she reports she felt like this made her sick. She takes Xarelto at home for stroke prevention. Mother bedside reports she felt like patient hasn't been same since her last mini stroke several months ago.   Past Medical History:  Past Medical History:  Diagnosis Date   Acute drug-induced gout of left foot 03/01/2018   Last Assessment & Plan:  Likely at least partially brought on by diuresis.  Needs to continue to diurese  Will push hydration Stop allopurinol given initiation during acute flare may worsen this, re-broach this when asymptomatic Avoid nsaids given stomach pain Trial colchicine Add acetaminophen Ice, elevate, rest   Allergy    seasonal   Anxiety    Arthritis    Right Knee   Asthma    CHF (congestive heart failure) (HCC)    COPD (chronic obstructive pulmonary disease) (HCC)    Coronary artery disease    Leaky heart valve   Diabetes mellitus without complication (HCC)    Dysrhythmia    Fibromyalgia    GERD (gastroesophageal reflux disease)    Hypertension    Pneumonia  PUD (peptic ulcer disease)    Pulmonary HTN ([place])    Rheumatic fever/heart disease    Sleep apnea     Problem List: Patient Active Problem List   Diagnosis Date Noted   Abdominal pain 09/16/2022   AKI (acute kidney injury) ([place]) 09/16/2022   Elevated LFTs 09/16/2022   Hyperbilirubinemia    Iron deficiency anemia    Non compliance w medication  regimen 01/15/2020   Pain medication agreement broken 35/59/7416   History of illicit drug use 38/45/3646   Misuse of prescription only drugs 01/15/2020   History of gastric ulcer 01/08/2020   Stomach irritation 01/08/2020   Esophageal dysphagia 01/08/2020   Class 3 severe obesity due to excess calories with serious comorbidity in adult [facility]) 01/08/2020   Pharmacologic therapy 12/14/2019   Prediabetes 09/27/2019   Numbness 08/03/2019   Headache disorder 08/03/2019   Elevated uric acid in blood 02/08/2019   Hyperglycemia 02/08/2019   Secondary osteoarthritis of multiple sites 02/08/2019   Osteoarthritis of knees (Bilateral) (R>L) 02/08/2019   Chronic gout of multiple sites, unspecified cause 02/08/2019   Chronic musculoskeletal pain (Fourth Area of Pain) 02/08/2019   Epidural lipomatosis 02/08/2019   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/08/2019   History of cocaine use 01/18/2019   Chronic gout involving toe of foot, unspecified cause (Left) 01/18/2019   Chronic hip pain (Left) 01/18/2019   Osteoarthritis of sacroiliac joints (Bilateral) 01/18/2019   Other specified dorsopathies, sacral and sacrococcygeal region 01/18/2019   Lumbar facet arthropathy (Bilateral) 01/05/2019   Melena 01/03/2019   Acute esophagogastric ulcer 01/03/2019   Spondylosis without myelopathy or radiculopathy, lumbar region 12/27/2018   Lumbar facet syndrome (Bilateral) 12/27/2018   DDD (degenerative disc disease), lumbar 12/27/2018   Chronic low back pain (Primary Area of Pain) (Bilateral) (L>R) w/o sciatica 12/27/2018   Abnormal MRI, lumbar spine (01/05/2018) 12/27/2018   Elevated sed rate 12/19/2018   Elevated C-reactive protein 12/19/2018   Diplopia 12/14/2018   Dizziness 12/14/2018   Headache, chronic daily 12/14/2018   Numbness and tingling of both feet 12/14/2018   CHF (congestive heart failure) ([place]) 12/04/2018   Class 3 severe obesity with body mass index (BMI) of 45.0 to 49.9 in adult ([place]) 11/14/2018    Chronic anticoagulation (XARELTO) 11/14/2018   Vitamin D deficiency 11/14/2018   Chronic knee pain after total replacement (Fifth Area of Pain) (Right) 11/14/2018   Chest pain with moderate risk of acute coronary syndrome 10/17/2018   Chronic low back pain (Primary Area of Pain) (Bilateral) w/ sciatica (Left) 10/10/2018   Chronic lower extremity pain (Secondary Area of Pain) (Left) 10/10/2018   Sternal pain (Tertiary Area of Pain) 10/10/2018   Fibromyalgia (Fourth Area of Pain) 10/10/2018   Chronic knee pain (Right) 10/10/2018   Chronic sacroiliac joint pain (Bilateral) (L>R) 10/10/2018   Chronic pain syndrome 10/10/2018   Long term current use of opiate analgesic 10/10/2018   Screening for colon cancer 10/10/2018   Disorder of skeletal system 10/10/2018   Problems influencing health status 10/10/2018   HTN (hypertension) 07/26/2018   S/P MVR (mitral valve replacement) 06/20/2018   Shortness of breath 05/27/2018   GIB (gastrointestinal bleeding) 05/22/2018   A-fib ([place]) 04/22/2018   Insomnia 12/25/2016   Osteoarthritis of knee (Right) 09/04/2016   Tobacco dependence 05/08/2015   GAD (generalized anxiety disorder) 03/13/2015   Moderate to severe pulmonary hypertension ([place]) 03/13/2015   Rheumatic heart disease, unspecified 03/13/2015   Chronic diastolic heart failure ([place]) 02/25/2015   Depression 02/25/2015   History of  substance abuse ([place]) 02/25/2015    Past Surgical History: Past Surgical History:  Procedure Laterality Date   ADENOIDECTOMY     COLONOSCOPY WITH PROPOFOL N/A 11/06/2019   Procedure: COLONOSCOPY WITH PROPOFOL;  Surgeon: Virgel Manifold, MD;  Location: [facility] ENDOSCOPY;  Service: Endoscopy;  Laterality: N/A;  2 day prep    COLONOSCOPY WITH PROPOFOL N/A 06/29/2022   Procedure: COLONOSCOPY WITH PROPOFOL;  Surgeon: Lin Landsman, MD;  Location: [facility] ENDOSCOPY;  Service: Gastroenterology;  Laterality: N/A;   CORONARY ARTERY BYPASS GRAFT     June 27 2018    ESOPHAGOGASTRODUODENOSCOPY (EGD) WITH PROPOFOL N/A 05/25/2018   Procedure: ESOPHAGOGASTRODUODENOSCOPY (EGD) WITH PROPOFOL;  Surgeon: Lucilla Lame, MD;  Location: [facility] ENDOSCOPY;  Service: Endoscopy;  Laterality: N/A;   ESOPHAGOGASTRODUODENOSCOPY (EGD) WITH PROPOFOL N/A 11/06/2019   Procedure: ESOPHAGOGASTRODUODENOSCOPY (EGD) WITH PROPOFOL;  Surgeon: Virgel Manifold, MD;  Location: [facility] ENDOSCOPY;  Service: Endoscopy;  Laterality: N/A;   ESOPHAGOGASTRODUODENOSCOPY (EGD) WITH PROPOFOL N/A 06/29/2022   Procedure: ESOPHAGOGASTRODUODENOSCOPY (EGD) WITH PROPOFOL;  Surgeon: Lin Landsman, MD;  Location: [facility] ENDOSCOPY;  Service: Gastroenterology;  Laterality: N/A;   GIVENS CAPSULE STUDY N/A 07/22/2022   Procedure: GIVENS CAPSULE STUDY;  Surgeon: Lin Landsman, MD;  Location: [facility] ENDOSCOPY;  Service: Gastroenterology;  Laterality: N/A;   MITRAL VALVE REPLACEMENT     MULTIPLE TOOTH EXTRACTIONS     TONSILLECTOMY     TOTAL KNEE ARTHROPLASTY Right 09/04/2016   Procedure: TOTAL KNEE ARTHROPLASTY; with lateral release;  Surgeon: Earlie Server, MD;  Location: [place];  Service: Orthopedics;  Laterality: Right;    Allergies: Allergies  Allergen Reactions   Amiodarone Nausea And Vomiting   Aspirin Swelling   Flexeril [Cyclobenzaprine] Swelling   Trazamine [Trazodone & Diet Manage Prod] Nausea And Vomiting   Codeine Rash   Tramadol Rash    Home Medications: (Not in a hospital admission)  Home medication reconciliation was completed with the patient.   Scheduled Inpatient Medications:    allopurinol  100 mg Oral Daily   docusate sodium  100 mg Oral BID   pantoprazole (PROTONIX) IV  40 mg Intravenous Q12H   sodium chloride flush  3 mL Intravenous Q12H    Continuous Inpatient Infusions:    sodium chloride Stopped (09/16/22 1410)   sodium chloride 75 mL/hr at 09/16/22 1544   heparin 1,200 Units/hr (09/16/22 1544)    PRN Inpatient Medications:  acetaminophen **OR** acetaminophen,  albuterol, fentaNYL (SUBLIMAZE) injection, HYDROcodone-acetaminophen, ipratropium-albuterol, nitroGLYCERIN, promethazine  Family History: family history includes Asthma in her mother; Breast cancer in her maternal aunt; COPD in her mother; Cancer in her mother; Congestive Heart Failure in her father; Rheum arthritis in her mother.  The patient's family history is negative for inflammatory bowel disorders, GI malignancy, or solid organ transplantation.  Social History:   reports that she has been smoking cigarettes. She has been smoking an average of .1 packs per day. She has never used smokeless tobacco. She reports that she does not currently use drugs. She reports that she does not drink alcohol. The patient denies ETOH, tobacco, or drug use.   Review of Systems: Constitutional: Weight is stable.  Eyes: No changes in vision. ENT: No oral lesions, sore throat.  GI: see HPI.  Heme/Lymph: No easy bruising.  CV: No chest pain.  GU: No hematuria.  Integumentary: No rashes.  Neuro: No headaches.  Psych: No depression/anxiety.  Endocrine: No heat/cold intolerance.  Allergic/Immunologic: No urticaria.  Resp: No cough, SOB.  Musculoskeletal: No joint swelling.  Physical Examination: BP 97/68   Pulse (!) 106   Temp 97.7 F (36.5 C) (Oral)   Resp 18   Ht _0  (1.626 m)   Wt 120.2 kg   LMP 11/08/2009 Comment: menopause  SpO2 100%   BMI 45.49 kg/m  Gen: NAD, alert and oriented x 4, tearful HEENT: PEERLA, EOMI, +scleral icterus  Neck: supple, no JVD or thyromegaly Chest: CTA bilaterally, no wheezes, crackles, or other adventitious sounds CV: RRR, no m/g/c/r Abd: soft, nondistended, obese abdomen, normoactive bowel sounds, tender to light and deep palpation in all four quadrants out of proportion to exam, no rebound or guarding, no HSM or abdominal masses Ext: no edema, well perfused with 2+ pulses, Skin: no rash or lesions noted Lymph: no LAD  Data: Lab Results  Component Value  Date   WBC 6.1 09/16/2022   HGB 9.2 (L) 09/16/2022   HCT 32.8 (L) 09/16/2022   MCV 78.3 (L) 09/16/2022   PLT 244 09/16/2022   Recent Labs  Lab 09/15/22 1259 09/16/22 0325  HGB 9.9* 9.2*   Lab Results  Component Value Date   NA 139 09/16/2022   K 3.5 09/16/2022   CL 99 09/16/2022   CO2 30 09/16/2022   BUN 50 (H) 09/16/2022   CREATININE 3.52 (H) 09/16/2022   GLU 79 08/21/2014   Lab Results  Component Value Date   ALT 35 09/16/2022   AST 51 (H) 09/16/2022   ALKPHOS 68 09/16/2022   BILITOT 9.1 (H) 09/16/2022   Recent Labs  Lab 09/16/22 0942  APTT 36  INR 2.0*    Assessment/Plan:  56 y/o female with a PMH of HTN, COPD on chronic O2 supplementation, T2DM, CHF, PAF on chronic anticoagulation s/p ablation, hx of mitral valve replacement 05/2018, GERD, and OSA presented to the [facility] ED yesterday afternoon for chief complaint of abnormal blood work at [employer] office which showed AKI and hyperbilirubinemia. She endorses a 10-monthhistory of poor PO intake, decreased appetite, intractable nausea, and diffuse, non-specific abdominal pain. GI consulted in context of isolated hyperbilirubinemia.   Isolated hyperbilirubinemia - unclear cause at present time, total bilirubin has not been fractionated so unclear if indirect or direct hyperbilirubinemia. Differential includes viral hepatitis, bilirubin overproduction, impaired bilirubin conjugation, biliary obstruction, hepatic inflammation/injury, congestive heart failure, infectious agents, NASH, etc  Diffuse abdominal pain - non-specific, 2/2 acute gastroenteritis, cholecystitis, functional, MSK, neuropathic, etc  AKI - likely in setting of hypovolemia and dehydration  Right heart strain - mentioned on MRCP, cardiology consulted   COPD on chronic O2 supplementation  PAF on chronic anticoagulation   Morbid obesity   Recommendations:  - Will order other labs to help rule out other intrinsic causes of hyperbilirubinemia - Continue  supportive care with antiemetics prn, pain control, IV fluid hydration - Appreciate General Surgery following. Surgery does not feel GB/cholecystitis is most likely diagnosis.  - Appreciate Cardiology input given evidence of severe right heart strain - No indication for repeat luminal evaluation at this time - GI following along with you for now and will follow up on labs  Thank you for the consult. Please call with questions or concerns.  GGeanie Kenning PA-C [facility] [phone]

## 2022-09-16 NOTE — ED Notes (Signed)
Pt transitioned to a hospital bed to promote comfort.  °

## 2022-09-16 NOTE — Progress Notes (Signed)
[facility]                                                                              Chelsea Ramsey, is a 56 y.o. female, DOB - 10/04/1966, VAN:[id number] Admit date - 09/15/2022    Outpatient Primary MD for the patient is Emelia Loron, NP  LOS - 0  days  Chief Complaint  Patient presents with   Renal Problem       Brief summary   Patient is 56 year old female with DM, pulm HTN, COPD, CHF, GERD, OSA was seen by PCP on day of admission for not feeling well and abd pain for about a month. Patient complained of pain in RUQ and right flank with nausea.   abnormal outpatient labs showing AKI.   In ED, her labwork confirmed AKI with Cr 3.54, total bilirubin of 9.6.  AST mildly elevated to 57, ALT 35, Alk Phos 80.  U/S was done which showed gallbladder wall thickening to 5 mm with pericholecystic fluid, suspicious for acute cholecystitis.  CBD per U/S was 3 mm.   Assessment & Plan    Principal Problem:   Right sided abdominal pain, transaminitis - unclear etiology, suspected cholecystitis  - surgery consulted, recommended MRCP  - MRCP showed biliary sludge, GB mild edematous. No biliary tract dilation to suggest obstruction.  No choledocholithiasis. 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 with extensive comorbidities including valvular heart disease, A-fib, pulmonary HTN, CHF, CAD) -GI and cardiology consulted, will await recommendations -Patient had endoscopy 06/29/2022 which had shown erythematous mucosa in the antrum, normal duodenal bulb and second portion of duodenum.  Colonoscopy had shown melanosis coli but otherwise normal.    Active Problems:    AKI (acute kidney injury) ([place]) -Baseline creatinine 0.78 on 01/02/2022, presented with creatinine of 3.54 on 09/15/2022 -Continue IV fluid hydration    HTN (hypertension) -Borderline BP, hold all antihypertensives    Iron deficiency anemia -Hemoglobin 9.8 on  11/02/2020, on admission 9.9 -H&H stable    A-fib (HCC),  Chronic diastolic heart failure [facility]) -Cardiology consulted, follow 2D echo -Patient was placed on Xarelto however unclear if he will need any further procedures, placed on IV heparin drip for now     Tobacco dependence -Counseled on smoking cessation  Morbid obesity Estimated body mass index is 45.49 kg/m as calculated from the following:   Height as of this encounter: 5' 4"  (1.626 m).   Weight as of this encounter: 120.2 kg.  Code Status: Full DVT Prophylaxis:    IV heparin drip   Level of Care: Level of care: Progressive Family Communication: Updated patient  Disposition Plan:      Remains inpatient appropriate: Work-up in progress   Procedures:  MRCP  Consultants:   General surgery GI Cardiology  Antimicrobials: None    Medications  allopurinol  100 mg Oral Daily   docusate sodium  100 mg Oral BID   pantoprazole (PROTONIX) IV  40 mg Intravenous Q12H   sodium chloride flush  3 mL Intravenous Q12H      Subjective:   Michelle Nasuti was seen and examined  today.  Complaining of right-sided abdominal pain, nausea.  No acute fevers, chest pain or shortness of breath.  BP borderline.  Objective:   Vitals:   09/16/22 0150 09/16/22 0400 09/16/22 0600 09/16/22 0841  BP: 94/65 92/66 91/62  95/71  Pulse: 100 (!) 104 100 (!) 118  Resp: 18 18 16 16   Temp: 97.9 F (36.6 C) 97.9 F (36.6 C)    TempSrc: Oral Oral    SpO2: 100% 100% 100% 100%  Weight:      Height:        Intake/Output Summary (Last 24 hours) at 09/16/2022 1301 Last data filed at 09/16/2022 1034 Gross per 24 hour  Intake --  Output 300 ml  Net -300 ml     Wt Readings from Last 3 Encounters:  09/15/22 120.2 kg  06/29/22 120.2 kg  12/18/21 127.5 kg     Exam General: Alert and oriented x 3, NAD Cardiovascular: Irregular,  RRR, tachycardia Respiratory: Clear to auscultation bilaterally Gastrointestinal: Soft, mild diffuse TTP,  no rebound or guarding, BS+ Ext: no pe no new dal edema bilaterally Neuro: no new FND's Psych: Normal affect    Data Reviewed:  I have personally reviewed following labs    CBC Lab Results  Component Value Date   WBC 6.1 09/16/2022   RBC 4.19 09/16/2022   HGB 9.2 (L) 09/16/2022   HCT 32.8 (L) 09/16/2022   MCV 78.3 (L) 09/16/2022   MCH 22.0 (L) 09/16/2022   PLT 244 09/16/2022   MCHC 28.0 (L) 09/16/2022   RDW 23.6 (H) 09/16/2022   LYMPHSABS 1.2 09/15/2022   MONOABS 0.5 09/15/2022   EOSABS 0.0 09/15/2022   BASOSABS 0.0 58/07/9832     Last metabolic panel Lab Results  Component Value Date   NA 139 09/16/2022   K 3.5 09/16/2022   CL 99 09/16/2022   CO2 30 09/16/2022   BUN 50 (H) 09/16/2022   CREATININE 3.52 (H) 09/16/2022   GLUCOSE 80 09/16/2022   GFRNONAA 15 (L) 09/16/2022   GFRAA >60 01/31/2020   CALCIUM 8.7 (L) 09/16/2022   PHOS 4.1 12/03/2018   PROT 6.4 (L) 09/16/2022   ALBUMIN 2.7 (L) 09/16/2022   LABGLOB 2.9 10/10/2018   AGRATIO 1.5 10/10/2018   BILITOT 9.1 (H) 09/16/2022   ALKPHOS 68 09/16/2022   AST 51 (H) 09/16/2022   ALT 35 09/16/2022   ANIONGAP 10 09/16/2022    CBG (last 3)  No results for input(s): "GLUCAP" in the last 72 hours.    Coagulation Profile: Recent Labs  Lab 09/16/22 0942  INR 2.0*     Radiology Studies: I have personally reviewed the imaging studies  MR ABDOMEN MRCP WO CONTRAST  Result Date: 09/16/2022 CLINICAL DATA:  56 year old female with history of jaundice. Nausea. EXAM: MRI ABDOMEN WITHOUT CONTRAST  (INCLUDING MRCP) TECHNIQUE: Multiplanar multisequence MR imaging of the abdomen was performed. Heavily T2-weighted images of the biliary and pancreatic ducts were obtained, and three-dimensional MRCP images were rendered by post processing. COMPARISON:  No prior abdominal MRI or MRCP. Abdominal ultrasound 09/15/2022. FINDINGS: Comment: Portions of today's examination are substantially limited by patient respiratory motion. The  study is also severely limited for detection and characterization of visceral and/or vascular lesions by lack of IV gadolinium. Lower chest: Severe cardiomegaly. Massively distended inferior vena cava. Hepatobiliary: No definite suspicious cystic or solid hepatic lesions are confidently identified on today's motion limited noncontrast examination. No intra or extrahepatic biliary ductal dilatation noted on MRCP images. Common bile duct measures only 3 mm  in the porta hepatis. No filling defect within the common bile duct to suggest choledocholithiasis. T1 hyperintense, T2 hypointense material lies dependently within the gallbladder. Gallbladder does not appear overly distended. Accurate assessment for gallbladder wall thickening is limited on today's examination secondary to motion, but there may be some very mild gallbladder wall thickening and edema. No overt pericholecystic fluid or surrounding inflammatory changes. Pancreas: No definite pancreatic mass or peripancreatic fluid collections or inflammatory changes noted on today's noncontrast examination. No pancreatic ductal dilatation. Spleen:  Unremarkable. Adrenals/Urinary Tract: Unenhanced appearance of the kidneys and bilateral adrenal glands is normal. No hydroureteronephrosis in the visualized portions of the abdomen. Stomach/Bowel: Visualized portions are unremarkable. Vascular/Lymphatic: No aneurysm identified in the visualized abdominal vasculature. No lymphadenopathy noted in the abdomen. Other: No significant volume of ascites noted in the visualized portions of the peritoneal cavity. Musculoskeletal: No aggressive appearing osseous lesions are noted in the visualized portions of the skeleton. IMPRESSION: 1. Biliary sludge lying dependently in the gallbladder. Gallbladder wall may be mildly edematous, but is poorly evaluated on today's motion limited examination. If there is any clinical concern for acute cholecystitis, further evaluation with right  upper quadrant abdominal ultrasound or nuclear medicine hepatobiliary scan should be considered at this time. 2. No biliary tract dilatation to suggest obstruction. No evidence of choledocholithiasis. 3. Severe cardiomegaly. Electronically Signed   By: Vinnie Langton M.D.   On: 09/16/2022 05:28   US ABDOMEN LIMITED RUQ (LIVER/GB)  Result Date: 09/15/2022 CLINICAL DATA:  Elevated bilirubin EXAM: ULTRASOUND ABDOMEN LIMITED RIGHT UPPER QUADRANT COMPARISON:  CT abdomen and pelvis 12/01/2018 FINDINGS: Gallbladder: Sludge is present within the gallbladder. Small amount of pericholecystic fluid. The gallbladder wall is mildly thickened measuring 5 mm. Sonographic Murphy's sign was positive. Common bile duct: Diameter: 3 mm Liver: No focal lesion identified. Increased echogenicity. Portal vein is patent on Doppler imaging with bidirectional flow. Other: None. IMPRESSION: 1. Findings compatible with acute cholecystitis. 2. 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 Electronically Signed   By: Placido Sou M.D.   On: 09/15/2022 23:07       Meliza Kage M.D. [facility] 09/16/2022, 1:01 PM  Available via Epic secure chat 7am-7pm After 7 pm, please refer to night coverage provider listed on amion.

## 2022-09-16 NOTE — Consult Note (Signed)
CARDIOLOGY CONSULT NOTE               Patient ID: Chelsea Ramsey MRN: [id number] DOB/AGE: 1966/02/20 56 y.o.  Admit date: 09/15/2022 Referring Physician Dr Estill Cotta hospitalist Primary Physician Dr. Veda Canning Primary Cardiologist Paraschos Reason for Consultation shortness of breath weakness fatigue  HPI: 56 year old female significant shortness of breath dyspnea abdominal pain previous atrial fibrillation on anticoagulation recently was placed on antiarrhythmic therapy with chemical cardioversion patient was here with abdominal discomfort thought to be possibly acute cholecystitis altered mental status cardiology was consulted when chest x-ray shows showed significant heart failure and shortness of breath.  Review of systems complete and found to be negative unless listed above     Past Medical History:  Diagnosis Date   Acute drug-induced gout of left foot 03/01/2018   Last Assessment & Plan:  Likely at least partially brought on by diuresis.  Needs to continue to diurese  Will push hydration Stop allopurinol given initiation during acute flare may worsen this, re-broach this when asymptomatic Avoid nsaids given stomach pain Trial colchicine Add acetaminophen Ice, elevate, rest   Allergy    seasonal   Anxiety    Arthritis    Right Knee   Asthma    CHF (congestive heart failure) (HCC)    COPD (chronic obstructive pulmonary disease) (HCC)    Coronary artery disease    Leaky heart valve   Diabetes mellitus without complication (HCC)    Dysrhythmia    Fibromyalgia    GERD (gastroesophageal reflux disease)    Hypertension    Pneumonia    PUD (peptic ulcer disease)    Pulmonary HTN (HCC)    Rheumatic fever/heart disease    Sleep apnea     Past Surgical History:  Procedure Laterality Date   ADENOIDECTOMY     COLONOSCOPY WITH PROPOFOL N/A 11/06/2019   Procedure: COLONOSCOPY WITH PROPOFOL;  Surgeon: Virgel Manifold, MD;  Location: [facility] ENDOSCOPY;   Service: Endoscopy;  Laterality: N/A;  2 day prep    COLONOSCOPY WITH PROPOFOL N/A 06/29/2022   Procedure: COLONOSCOPY WITH PROPOFOL;  Surgeon: Lin Landsman, MD;  Location: [facility] ENDOSCOPY;  Service: Gastroenterology;  Laterality: N/A;   CORONARY ARTERY BYPASS GRAFT     June 27 2018   ESOPHAGOGASTRODUODENOSCOPY (EGD) WITH PROPOFOL N/A 05/25/2018   Procedure: ESOPHAGOGASTRODUODENOSCOPY (EGD) WITH PROPOFOL;  Surgeon: Lucilla Lame, MD;  Location: [facility] ENDOSCOPY;  Service: Endoscopy;  Laterality: N/A;   ESOPHAGOGASTRODUODENOSCOPY (EGD) WITH PROPOFOL N/A 11/06/2019   Procedure: ESOPHAGOGASTRODUODENOSCOPY (EGD) WITH PROPOFOL;  Surgeon: Virgel Manifold, MD;  Location: [facility] ENDOSCOPY;  Service: Endoscopy;  Laterality: N/A;   ESOPHAGOGASTRODUODENOSCOPY (EGD) WITH PROPOFOL N/A 06/29/2022   Procedure: ESOPHAGOGASTRODUODENOSCOPY (EGD) WITH PROPOFOL;  Surgeon: Lin Landsman, MD;  Location: [facility] ENDOSCOPY;  Service: Gastroenterology;  Laterality: N/A;   GIVENS CAPSULE STUDY N/A 07/22/2022   Procedure: GIVENS CAPSULE STUDY;  Surgeon: Lin Landsman, MD;  Location: [facility] ENDOSCOPY;  Service: Gastroenterology;  Laterality: N/A;   MITRAL VALVE REPLACEMENT     MULTIPLE TOOTH EXTRACTIONS     TONSILLECTOMY     TOTAL KNEE ARTHROPLASTY Right 09/04/2016   Procedure: TOTAL KNEE ARTHROPLASTY; with lateral release;  Surgeon: Earlie Server, MD;  Location: [place];  Service: Orthopedics;  Laterality: Right;    (Not in a hospital admission)  Social History   Socioeconomic History   Marital status: Married    Spouse name: Not on file   Number of children: Not on file   Years  of education: Not on file   Highest education level: Not on file  Occupational History   Occupation: disabled  Tobacco Use   Smoking status: Every Day    Packs/day: 0.10    Types: Cigarettes   Smokeless tobacco: Never   Tobacco comments:    4/20 was not able to quit.  Still at 1-3 a day. She would like to wait to set a new  quit date until we get back to class to have the extra in person support  Vaping Use   Vaping Use: Never used  Substance and Sexual Activity   Alcohol use: Never    Alcohol/week: 3.0 standard drinks of alcohol    Types: 3 Cans of beer per week    Comment: 16 oz per week   Drug use: Not Currently    Comment: Previous use of cocaine and marijuana last use 07/31/16    Sexual activity: Not Currently  Other Topics Concern   Not on file  Social History Narrative   Not on file   Social Determinants of Health   Financial Resource Strain: Low Risk  (10/02/2019)   Overall Financial Resource Strain (CARDIA)    Difficulty of Paying Living Expenses: Not hard at all  Food Insecurity: No Food Insecurity (03/19/2020)   Hunger Vital Sign    Worried About Running Out of Food in the Last Year: Never true    Ran Out of Food in the Last Year: Never true  Transportation Needs: Unmet Transportation Needs (03/19/2020)   PRAPARE - Transportation    Lack of Transportation (Medical): No    Lack of Transportation (Non-Medical): Yes  Physical Activity: Insufficiently Active (10/02/2019)   Exercise Vital Sign    Days of Exercise per Week: 7 days    Minutes of Exercise per Session: 20 min  Stress: No Stress Concern Present (10/02/2019)   [employer] [place]    Feeling of Stress : Not at all  Social Connections: Moderately Integrated (10/02/2019)   Social Connection and Isolation Panel [NHANES]    Frequency of Communication with Friends and Family: More than three times a week    Frequency of Social Gatherings with Friends and Family: More than three times a week    Attends Religious Services: 1 to 4 times per year    Active Member of [employer] or Organizations: Yes    Attends Archivist Meetings: 1 to 4 times per year    Marital Status: Never married  Intimate Partner Violence: Not At Risk (10/02/2019)   Humiliation, Afraid, Rape, and Kick  questionnaire    Fear of Current or Ex-Partner: No    Emotionally Abused: No    Physically Abused: No    Sexually Abused: No    Family History  Problem Relation Age of Onset   COPD Mother    Cancer Mother        Bone   Asthma Mother    Rheum arthritis Mother    Congestive Heart Failure Father    Breast cancer Maternal Aunt       Review of systems complete and found to be negative unless listed above      PHYSICAL EXAM  General: Well developed, well nourished, in no acute distress HEENT:  Normocephalic and atramatic Neck:  No JVD.  Lungs: Clear bilaterally to auscultation and percussion. Heart: HRRR . Normal S1 and S2 without gallops or murmurs.  Abdomen: Bowel sounds are positive, abdomen soft and non-tender  Msk:  Back normal, normal gait. Normal strength and tone for age. Extremities: No clubbing, cyanosis or edema.   Neuro: Alert and oriented X 3. Psych:  Good affect, responds appropriately  Labs:   Lab Results  Component Value Date   WBC 6.1 09/16/2022   HGB 9.2 (L) 09/16/2022   HCT 32.8 (L) 09/16/2022   MCV 78.3 (L) 09/16/2022   PLT 244 09/16/2022    Recent Labs  Lab 09/16/22 0325  NA 139  K 3.5  CL 99  CO2 30  BUN 50*  CREATININE 3.52*  CALCIUM 8.7*  PROT 6.4*  BILITOT 9.1*  ALKPHOS 68  ALT 35  AST 51*  GLUCOSE 80   Lab Results  Component Value Date   CKTOTAL 39 12/17/2013   CKMB 0.9 03/28/2013   TROPONINI <0.03 12/02/2018    Lab Results  Component Value Date   CHOL 96 04/03/2014   CHOL 91 12/09/2013   CHOL 145 06/02/2012   Lab Results  Component Value Date   HDL 32 (A) 04/03/2014   HDL 31 (L) 12/09/2013   HDL 66 (H) 06/02/2012   Lab Results  Component Value Date   LDLCALC 51 04/03/2014   LDLCALC 50 12/09/2013   LDLCALC 58 06/02/2012   Lab Results  Component Value Date   TRIG 63 04/03/2014   TRIG 50 12/09/2013   TRIG 106 06/02/2012   No results found for: "CHOLHDL" No results found for: "LDLDIRECT"    Radiology: DG  Chest 1 View  Result Date: 09/16/2022 CLINICAL DATA:  Worsening shortness of breath. 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 Cardiomegaly which has increased from 2021. Slight globular configuration of the heart, query pericardial effusion. Suspect small right pleural effusion. Chronic bilateral lung opacities that appears stable. No acute airspace disease or pneumothorax. On limited assessment, no acute osseous findings. IMPRESSION: 1. Increased cardiomegaly from 2021. Slight globular configuration of the heart, query pericardial effusion. 2. Suspect small right pleural effusion. 3. Stable chronic bilateral lung opacities. Electronically Signed   By: Keith Rake M.D.   On: 09/16/2022 17:21   MR ABDOMEN MRCP WO CONTRAST  Result Date: 09/16/2022 CLINICAL DATA:  56 year old female with history of jaundice. Nausea. EXAM: MRI ABDOMEN WITHOUT CONTRAST  (INCLUDING MRCP) TECHNIQUE: Multiplanar multisequence MR imaging of the abdomen was performed. Heavily T2-weighted images of the biliary and pancreatic ducts were obtained, and three-dimensional MRCP images were rendered by post processing. COMPARISON:  No prior abdominal MRI or MRCP. Abdominal ultrasound 09/15/2022. FINDINGS: Comment: Portions of today's examination are substantially limited by patient respiratory motion. The study is also severely limited for detection and characterization of visceral and/or vascular lesions by lack of IV gadolinium. Lower chest: Severe cardiomegaly. Massively distended inferior vena cava. Hepatobiliary: No definite suspicious cystic or solid hepatic lesions are confidently identified on today's motion limited noncontrast examination. No intra or extrahepatic biliary ductal dilatation noted on MRCP images. Common bile duct measures only 3 mm in the porta hepatis. No filling defect within the  common bile duct to suggest choledocholithiasis. T1 hyperintense, T2 hypointense material lies dependently within the gallbladder. Gallbladder does not appear overly distended. Accurate assessment for gallbladder wall thickening is limited on today's examination secondary to motion, but there may be some very mild gallbladder wall thickening and edema. No overt pericholecystic fluid or surrounding inflammatory changes. Pancreas: No definite pancreatic mass or peripancreatic fluid collections or inflammatory changes noted  on today's noncontrast examination. No pancreatic ductal dilatation. Spleen:  Unremarkable. Adrenals/Urinary Tract: Unenhanced appearance of the kidneys and bilateral adrenal glands is normal. No hydroureteronephrosis in the visualized portions of the abdomen. Stomach/Bowel: Visualized portions are unremarkable. Vascular/Lymphatic: No aneurysm identified in the visualized abdominal vasculature. No lymphadenopathy noted in the abdomen. Other: No significant volume of ascites noted in the visualized portions of the peritoneal cavity. Musculoskeletal: No aggressive appearing osseous lesions are noted in the visualized portions of the skeleton. IMPRESSION: 1. Biliary sludge lying dependently in the gallbladder. Gallbladder wall may be mildly edematous, but is poorly evaluated on today's motion limited examination. If there is any clinical concern for acute cholecystitis, further evaluation with right upper quadrant abdominal ultrasound or nuclear medicine hepatobiliary scan should be considered at this time. 2. No biliary tract dilatation to suggest obstruction. No evidence of choledocholithiasis. 3. Severe cardiomegaly. Electronically Signed   By: Vinnie Langton M.D.   On: 09/16/2022 05:28   US ABDOMEN LIMITED RUQ (LIVER/GB)  Result Date: 09/15/2022 CLINICAL DATA:  Elevated bilirubin EXAM: ULTRASOUND ABDOMEN LIMITED RIGHT UPPER QUADRANT COMPARISON:  CT abdomen and pelvis 12/01/2018 FINDINGS:  Gallbladder: Sludge is present within the gallbladder. Small amount of pericholecystic fluid. The gallbladder wall is mildly thickened measuring 5 mm. Sonographic Murphy's sign was positive. Common bile duct: Diameter: 3 mm Liver: No focal lesion identified. Increased echogenicity. Portal vein is patent on Doppler imaging with bidirectional flow. Other: None. IMPRESSION: 1. Findings compatible with acute cholecystitis. 2. 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 Electronically Signed   By: Placido Sou M.D.   On: 09/15/2022 23:07    EKG: Atrial fibrillation rapid ventricular sponsor rate of 129 with complex  ASSESSMENT AND PLAN:  Congestive heart failure Dysrhythmia Obstructive sleep apnea Asthma COPD Hypertension Pneumonia GERD Coronary artery disease Hypertension . Conclusion Advised patient refrain from tobacco abuse ABNominal pain agree with GI treatment and an evaluation  Elevated LFTs thought to be related to acute cholecystitis agree with MRCP Continue surgical follow-up and evaluation Acute on chronic renal insufficiency recommend hydration follow-up further cardiac studies Echocardiogram with reduced left ventricular function continue current therapy Encephalopathy continue current management Patient may need mild hydration for renal insufficiency Recommend anticoagulation for atrial fibrillation at least short-term With echocardiogram bleeding and findings continue medical therapy   Signed: Yolonda Kida MD, 09/16/2022, 10:44 PM

## 2022-09-16 NOTE — Assessment & Plan Note (Deleted)
Lab Results  Component Value Date   CREATININE 3.54 (H) 09/15/2022   CREATININE 0.78 01/02/2022   CREATININE 0.74 12/24/2021  Attribute to hypotension from meds.  We will avoid contrast and renally dose meds.

## 2022-09-16 NOTE — ED Notes (Signed)
Pt informs this RN that she wears 4L [place] while sleeping.

## 2022-09-17 ENCOUNTER — Inpatient Hospital Stay
Admit: 2022-09-17 | Discharge: 2022-09-17 | Disposition: A | Discharge status: Home / Self Care | Payer: Medicaid Other | Attending: Cardiology | Admitting: Cardiology

## 2022-09-17 ENCOUNTER — Encounter: Payer: Self-pay | Admitting: Internal Medicine

## 2022-09-17 DIAGNOSIS — R7989 Other specified abnormal findings of blood chemistry: Secondary | ICD-10-CM

## 2022-09-17 DIAGNOSIS — I5032 Chronic diastolic (congestive) heart failure: Secondary | ICD-10-CM | POA: Diagnosis not present

## 2022-09-17 DIAGNOSIS — I480 Paroxysmal atrial fibrillation: Secondary | ICD-10-CM | POA: Diagnosis not present

## 2022-09-17 DIAGNOSIS — R1010 Upper abdominal pain, unspecified: Secondary | ICD-10-CM | POA: Diagnosis not present

## 2022-09-17 DIAGNOSIS — D649 Anemia, unspecified: Secondary | ICD-10-CM | POA: Diagnosis not present

## 2022-09-17 DIAGNOSIS — N179 Acute kidney failure, unspecified: Secondary | ICD-10-CM | POA: Diagnosis not present

## 2022-09-17 LAB — CBC WITH DIFFERENTIAL/PLATELET
Abs Immature Granulocytes: 0.01 10*3/uL (ref 0.00–0.07)
Basophils Absolute: 0 10*3/uL (ref 0.0–0.1)
Basophils Relative: 1 %
Eosinophils Absolute: 0.1 10*3/uL (ref 0.0–0.5)
Eosinophils Relative: 1 %
HCT: 32.1 % — ABNORMAL LOW (ref 36.0–46.0)
Hemoglobin: 8.8 g/dL — ABNORMAL LOW (ref 12.0–15.0)
Immature Granulocytes: 0 %
Lymphocytes Relative: 21 %
Lymphs Abs: 1.1 10*3/uL (ref 0.7–4.0)
MCH: 21.7 pg — ABNORMAL LOW (ref 26.0–34.0)
MCHC: 27.4 g/dL — ABNORMAL LOW (ref 30.0–36.0)
MCV: 79.3 fL — ABNORMAL LOW (ref 80.0–100.0)
Monocytes Absolute: 0.4 10*3/uL (ref 0.1–1.0)
Monocytes Relative: 8 %
Neutro Abs: 3.7 10*3/uL (ref 1.7–7.7)
Neutrophils Relative %: 69 %
Platelets: 228 10*3/uL (ref 150–400)
RBC: 4.05 MIL/uL (ref 3.87–5.11)
RDW: 23.8 % — ABNORMAL HIGH (ref 11.5–15.5)
Smear Review: NORMAL
WBC: 5.4 10*3/uL (ref 4.0–10.5)
nRBC: 0.6 % — ABNORMAL HIGH (ref 0.0–0.2)

## 2022-09-17 LAB — HIV ANTIBODY (ROUTINE TESTING W REFLEX): HIV Screen 4th Generation wRfx: NONREACTIVE

## 2022-09-17 LAB — HEPATIC FUNCTION PANEL
ALT: 35 U/L (ref 0–44)
ALT: 32 U/L (ref 0–44)
AST: 55 U/L — ABNORMAL HIGH (ref 15–41)
AST: 51 U/L — ABNORMAL HIGH (ref 15–41)
Albumin: 2.8 g/dL — ABNORMAL LOW (ref 3.5–5.0)
Albumin: 2.6 g/dL — ABNORMAL LOW (ref 3.5–5.0)
Alkaline Phosphatase: 75 U/L (ref 38–126)
Alkaline Phosphatase: 72 U/L (ref 38–126)
Bilirubin, Direct: 5.5 mg/dL — ABNORMAL HIGH (ref 0.0–0.2)
Bilirubin, Direct: 5.1 mg/dL — ABNORMAL HIGH (ref 0.0–0.2)
Indirect Bilirubin: 4.3 mg/dL — ABNORMAL HIGH (ref 0.3–0.9)
Indirect Bilirubin: 4.2 mg/dL — ABNORMAL HIGH (ref 0.3–0.9)
Total Bilirubin: 9.8 mg/dL — ABNORMAL HIGH (ref 0.3–1.2)
Total Bilirubin: 9.3 mg/dL — ABNORMAL HIGH (ref 0.3–1.2)
Total Protein: 7 g/dL (ref 6.5–8.1)
Total Protein: 6.6 g/dL (ref 6.5–8.1)

## 2022-09-17 LAB — RETICULOCYTES
Immature Retic Fract: 38.1 % — ABNORMAL HIGH (ref 2.3–15.9)
RBC.: 4.31 MIL/uL (ref 3.87–5.11)
Retic Count, Absolute: 128.4 10*3/uL (ref 19.0–186.0)
Retic Ct Pct: 3 % (ref 0.4–3.1)

## 2022-09-17 LAB — ECHOCARDIOGRAM COMPLETE
AR max vel: 0.87 cm2
AV Area VTI: 1.14 cm2
AV Area mean vel: 0.9 cm2
AV Mean grad: 6 mmHg
AV Peak grad: 11.3 mmHg
Ao pk vel: 1.68 m/s
Area-P 1/2: 5.95 cm2
Height: 64 in
P 1/2 time: 233 msec
S' Lateral: 3.2 cm
Weight: 4239.89 oz

## 2022-09-17 LAB — CBC
HCT: 31.3 % — ABNORMAL LOW (ref 36.0–46.0)
Hemoglobin: 8.8 g/dL — ABNORMAL LOW (ref 12.0–15.0)
MCH: 21.9 pg — ABNORMAL LOW (ref 26.0–34.0)
MCHC: 28.1 g/dL — ABNORMAL LOW (ref 30.0–36.0)
MCV: 77.9 fL — ABNORMAL LOW (ref 80.0–100.0)
Platelets: 225 10*3/uL (ref 150–400)
RBC: 4.02 MIL/uL (ref 3.87–5.11)
RDW: 23.2 % — ABNORMAL HIGH (ref 11.5–15.5)
WBC: 5.5 10*3/uL (ref 4.0–10.5)
nRBC: 0.5 % — ABNORMAL HIGH (ref 0.0–0.2)

## 2022-09-17 LAB — IRON AND TIBC
Iron: 31 ug/dL (ref 28–170)
Saturation Ratios: 8 % — ABNORMAL LOW (ref 10.4–31.8)
TIBC: 416 ug/dL (ref 250–450)
UIBC: 385 ug/dL

## 2022-09-17 LAB — TECHNOLOGIST SMEAR REVIEW: Plt Morphology: NORMAL

## 2022-09-17 LAB — HEPARIN LEVEL (UNFRACTIONATED)
Heparin Unfractionated: 0.33 IU/mL (ref 0.30–0.70)
Heparin Unfractionated: 0.32 IU/mL (ref 0.30–0.70)

## 2022-09-17 LAB — BASIC METABOLIC PANEL
Anion gap: 12 (ref 5–15)
BUN: 45 mg/dL — ABNORMAL HIGH (ref 6–20)
CO2: 28 mmol/L (ref 22–32)
Calcium: 8.3 mg/dL — ABNORMAL LOW (ref 8.9–10.3)
Chloride: 98 mmol/L (ref 98–111)
Creatinine, Ser: 2.47 mg/dL — ABNORMAL HIGH (ref 0.44–1.00)
GFR, Estimated: 22 mL/min — ABNORMAL LOW (ref >60)
Glucose, Bld: 78 mg/dL (ref 70–99)
Potassium: 3.2 mmol/L — ABNORMAL LOW (ref 3.5–5.1)
Sodium: 138 mmol/L (ref 135–145)

## 2022-09-17 LAB — PROTIME-INR
INR: 2.1 — ABNORMAL HIGH (ref 0.8–1.2)
Prothrombin Time: 23.2 seconds — ABNORMAL HIGH (ref 11.4–15.2)

## 2022-09-17 LAB — GAMMA GT: GGT: 33 U/L (ref 7–50)

## 2022-09-17 LAB — FERRITIN: Ferritin: 30 ng/mL (ref 11–307)

## 2022-09-17 LAB — LACTATE DEHYDROGENASE: LDH: 345 U/L — ABNORMAL HIGH (ref 98–192)

## 2022-09-17 LAB — APTT
aPTT: 175 seconds (ref 24–36)
aPTT: 145 seconds — ABNORMAL HIGH (ref 24–36)
aPTT: 91 seconds — ABNORMAL HIGH (ref 24–36)

## 2022-09-17 LAB — BILIRUBIN, FRACTIONATED(TOT/DIR/INDIR)
Bilirubin, Direct: 5.2 mg/dL — ABNORMAL HIGH (ref 0.0–0.2)
Indirect Bilirubin: 4.1 mg/dL — ABNORMAL HIGH (ref 0.3–0.9)
Total Bilirubin: 9.3 mg/dL — ABNORMAL HIGH (ref 0.3–1.2)

## 2022-09-17 LAB — AMMONIA: Ammonia: 46 umol/L — ABNORMAL HIGH (ref 9–35)

## 2022-09-17 MED ORDER — FUROSEMIDE 10 MG/ML IJ SOLN
40.0000 mg | Freq: Two times a day (BID) | INTRAMUSCULAR | Status: DC
  Administered 2022-09-17 – 2022-09-26 (×17): 40 mg via INTRAVENOUS
  Filled 2022-09-17 (×17): qty 4

## 2022-09-17 MED ORDER — METOPROLOL TARTRATE 5 MG/5ML IV SOLN
2.5000 mg | Freq: Four times a day (QID) | INTRAVENOUS | Status: DC | PRN
  Administered 2022-09-18: 2.5 mg via INTRAVENOUS
  Filled 2022-09-17: qty 5

## 2022-09-17 MED ORDER — ONDANSETRON HCL 4 MG/2ML IJ SOLN
4.0000 mg | Freq: Once | INTRAMUSCULAR | Status: AC
  Administered 2022-09-17: 4 mg via INTRAVENOUS
  Filled 2022-09-17: qty 2

## 2022-09-17 MED ORDER — ONDANSETRON HCL 4 MG/2ML IJ SOLN: 4.0000 mg | Freq: Four times a day (QID) | INTRAMUSCULAR | Status: DC | PRN

## 2022-09-17 MED ORDER — POTASSIUM CHLORIDE CRYS ER 20 MEQ PO TBCR: 40.0000 meq | EXTENDED_RELEASE_TABLET | Freq: Once | ORAL | Status: DC

## 2022-09-17 MED ORDER — HEPARIN (PORCINE) 25000 UT/250ML-% IV SOLN
1350.0000 [IU]/h | INTRAVENOUS | Status: DC
  Administered 2022-09-17 – 2022-09-18 (×2): 1000 [IU]/h via INTRAVENOUS
  Administered 2022-09-19: 1050 [IU]/h via INTRAVENOUS
  Administered 2022-09-20: 1350 [IU]/h via INTRAVENOUS
  Filled 2022-09-17 (×3): qty 250

## 2022-09-17 MED ORDER — METOPROLOL TARTRATE 25 MG PO TABS
12.5000 mg | ORAL_TABLET | Freq: Two times a day (BID) | ORAL | Status: DC
  Filled 2022-09-17 (×2): qty 1

## 2022-09-17 NOTE — ED Notes (Signed)
Pt requesting PRN  pain medication at this time.

## 2022-09-17 NOTE — ED Notes (Signed)
Bedpan removed by family, no BM at this time

## 2022-09-17 NOTE — ED Notes (Signed)
Pt placed on bedpan at this time with assistance from [place], [place].

## 2022-09-17 NOTE — Consult Note (Signed)
[employer] NOTE  Patient Care Team: Emelia Loron, NP as PCP - General (Nurse Practitioner)  CHIEF COMPLAINTS/PURPOSE OF CONSULTATION: Anemia elevated bilirubin  Oncology History   No history exists.     HISTORY OF PRESENTING ILLNESS:  Chelsea Ramsey 56 y.o.  female with multiple medical problems including hypertension COPD chronic O2 supplementation type 2 diabetes morbid obesity CHF; history of mitral valve replacement [July 2019]; history of TIAs- on xarelto is currently admitted to hospital for worsening abdominal pain.   Patient had recent blood work with her PCP that showed-elevated creatinine; and elevated bilirubin up to 9 is fairly unremarkable AST [email or web address] patient has both elevated direct and indirect bilirubin.  Patient had a ultrasound that showed gallbladder sludge.  MRCP showed sludge without any evidence of choledocholithiasis.  Of note patient's noted to be in acute renal failure.  And also noted to have anemia hemoglobin of 9.  LDH slightly elevated around 345.  Haptoglobin pending.  Reticulocyte count slightly high.   Patient also had recent endoscopies  with Dr. Marius Ditch.   Hematology has been consulted for further evaluation recommendations.    At this time patient continues to complain of abdominal pain.  Otherwise no nausea vomiting.  Requesting pain medications.   Review of Systems  Constitutional:  Positive for malaise/fatigue. Negative for chills, diaphoresis, fever and weight loss.  HENT:  Positive for nosebleeds. Negative for sore throat.   Eyes:  Negative for double vision.  Respiratory:  Positive for shortness of breath. Negative for cough, hemoptysis, sputum production and wheezing.   Cardiovascular:  Positive for chest pain and leg swelling. Negative for palpitations and orthopnea.  Gastrointestinal:  Positive for abdominal pain, nausea and vomiting. Negative for blood in stool, constipation, diarrhea, heartburn and  melena.  Genitourinary:  Negative for dysuria, frequency and urgency.  Musculoskeletal:  Positive for joint pain. Negative for back pain.  Skin: Negative.  Negative for itching and rash.  Neurological:  Negative for dizziness, tingling, focal weakness, weakness and headaches.  Endo/Heme/Allergies:  Does not bruise/bleed easily.  Psychiatric/Behavioral:  Negative for depression. The patient is not nervous/anxious and does not have insomnia.     MEDICAL HISTORY:  Past Medical History:  Diagnosis Date   Acute drug-induced gout of left foot 03/01/2018   Last Assessment & Plan:  Likely at least partially brought on by diuresis.  Needs to continue to diurese  Will push hydration Stop allopurinol given initiation during acute flare may worsen this, re-broach this when asymptomatic Avoid nsaids given stomach pain Trial colchicine Add acetaminophen Ice, elevate, rest   Allergy    seasonal   Anxiety    Arthritis    Right Knee   Asthma    CHF (congestive heart failure) (HCC)    COPD (chronic obstructive pulmonary disease) (HCC)    Coronary artery disease    Leaky heart valve   Diabetes mellitus without complication (HCC)    Dysrhythmia    Fibromyalgia    GERD (gastroesophageal reflux disease)    Hypertension    Pneumonia    PUD (peptic ulcer disease)    Pulmonary HTN (HCC)    Rheumatic fever/heart disease    Sleep apnea     SURGICAL HISTORY: Past Surgical History:  Procedure Laterality Date   ADENOIDECTOMY     COLONOSCOPY WITH PROPOFOL N/A 11/06/2019   Procedure: COLONOSCOPY WITH PROPOFOL;  Surgeon: Virgel Manifold, MD;  Location: [facility] ENDOSCOPY;  Service: Endoscopy;  Laterality: N/A;  2 day prep  COLONOSCOPY WITH PROPOFOL N/A 06/29/2022   Procedure: COLONOSCOPY WITH PROPOFOL;  Surgeon: Lin Landsman, MD;  Location: [facility] ENDOSCOPY;  Service: Gastroenterology;  Laterality: N/A;   CORONARY ARTERY BYPASS GRAFT     June 27 2018   ESOPHAGOGASTRODUODENOSCOPY (EGD) WITH  PROPOFOL N/A 05/25/2018   Procedure: ESOPHAGOGASTRODUODENOSCOPY (EGD) WITH PROPOFOL;  Surgeon: Lucilla Lame, MD;  Location: [facility] ENDOSCOPY;  Service: Endoscopy;  Laterality: N/A;   ESOPHAGOGASTRODUODENOSCOPY (EGD) WITH PROPOFOL N/A 11/06/2019   Procedure: ESOPHAGOGASTRODUODENOSCOPY (EGD) WITH PROPOFOL;  Surgeon: Virgel Manifold, MD;  Location: [facility] ENDOSCOPY;  Service: Endoscopy;  Laterality: N/A;   ESOPHAGOGASTRODUODENOSCOPY (EGD) WITH PROPOFOL N/A 06/29/2022   Procedure: ESOPHAGOGASTRODUODENOSCOPY (EGD) WITH PROPOFOL;  Surgeon: Lin Landsman, MD;  Location: [facility] ENDOSCOPY;  Service: Gastroenterology;  Laterality: N/A;   GIVENS CAPSULE STUDY N/A 07/22/2022   Procedure: GIVENS CAPSULE STUDY;  Surgeon: Lin Landsman, MD;  Location: [facility] ENDOSCOPY;  Service: Gastroenterology;  Laterality: N/A;   MITRAL VALVE REPLACEMENT     MULTIPLE TOOTH EXTRACTIONS     TONSILLECTOMY     TOTAL KNEE ARTHROPLASTY Right 09/04/2016   Procedure: TOTAL KNEE ARTHROPLASTY; with lateral release;  Surgeon: Earlie Server, MD;  Location: [place];  Service: Orthopedics;  Laterality: Right;    SOCIAL HISTORY: Social History   Socioeconomic History   Marital status: Married    Spouse name: Not on file   Number of children: Not on file   Years of education: Not on file   Highest education level: Not on file  Occupational History   Occupation: disabled  Tobacco Use   Smoking status: Every Day    Packs/day: 0.10    Types: Cigarettes   Smokeless tobacco: Never   Tobacco comments:    4/20 was not able to quit.  Still at 1-3 a day. She would like to wait to set a new quit date until we get back to class to have the extra in person support  Vaping Use   Vaping Use: Never used  Substance and Sexual Activity   Alcohol use: Never    Alcohol/week: 3.0 standard drinks of alcohol    Types: 3 Cans of beer per week    Comment: 16 oz per week   Drug use: Not Currently    Comment: Previous use of cocaine and  marijuana last use 07/31/16    Sexual activity: Not Currently  Other Topics Concern   Not on file  Social History Narrative   Not on file   Social Determinants of Health   Financial Resource Strain: Low Risk  (10/02/2019)   Overall Financial Resource Strain (CARDIA)    Difficulty of Paying Living Expenses: Not hard at all  Food Insecurity: No Food Insecurity (03/19/2020)   Hunger Vital Sign    Worried About Running Out of Food in the Last Year: Never true    Ran Out of Food in the Last Year: Never true  Transportation Needs: Unmet Transportation Needs (03/19/2020)   PRAPARE - Transportation    Lack of Transportation (Medical): No    Lack of Transportation (Non-Medical): Yes  Physical Activity: Insufficiently Active (10/02/2019)   Exercise Vital Sign    Days of Exercise per Week: 7 days    Minutes of Exercise per Session: 20 min  Stress: No Stress Concern Present (10/02/2019)   [employer] [place]    Feeling of Stress : Not at all  Social Connections: Moderately Integrated (10/02/2019)   Social Connection and Isolation Panel [NHANES]  Frequency of Communication with Friends and Family: More than three times a week    Frequency of Social Gatherings with Friends and Family: More than three times a week    Attends Religious Services: 1 to 4 times per year    Active Member of [employer] or Organizations: Yes    Attends Archivist Meetings: 1 to 4 times per year    Marital Status: Never married  Intimate Partner Violence: Not At Risk (10/02/2019)   Humiliation, Afraid, Rape, and Kick questionnaire    Fear of Current or Ex-Partner: No    Emotionally Abused: No    Physically Abused: No    Sexually Abused: No    FAMILY HISTORY: Family History  Problem Relation Age of Onset   COPD Mother    Cancer Mother        Bone   Asthma Mother    Rheum arthritis Mother    Congestive Heart Failure Father    Breast cancer Maternal  Aunt     ALLERGIES:  is allergic to amiodarone, aspirin, flexeril [cyclobenzaprine], trazamine [trazodone & diet manage prod], codeine, and tramadol.  MEDICATIONS:  Current Facility-Administered Medications  Medication Dose Route Frequency Provider Last Rate Last Admin   0.9 %  sodium chloride infusion   Intravenous Continuous Para Skeans, MD   Stopped at 09/16/22 1410   0.9 %  sodium chloride infusion   Intravenous Continuous Rai, Ripudeep K, MD 75 mL/hr at 09/17/22 1600 Rate Verify at 09/17/22 1600   acetaminophen (TYLENOL) tablet 650 mg  650 mg Oral Q6H PRN Para Skeans, MD       Or   acetaminophen (TYLENOL) suppository 650 mg  650 mg Rectal Q6H PRN Para Skeans, MD       albuterol (PROVENTIL) (2.5 MG/3ML) 0.083% nebulizer solution 2.5 mg  2.5 mg Nebulization Q6H PRN Renda Rolls, RPH       allopurinol (ZYLOPRIM) tablet 100 mg  100 mg Oral Daily Florina Ou V, MD   100 mg at 09/16/22 1051   docusate sodium (COLACE) capsule 100 mg  100 mg Oral BID Para Skeans, MD   100 mg at 09/16/22 1046   fentaNYL (SUBLIMAZE) injection 25 mcg  25 mcg Intravenous Q3H PRN Rai, Ripudeep K, MD   25 mcg at 09/17/22 0630   furosemide (LASIX) injection 40 mg  40 mg Intravenous Q12H Callwood, Dwayne D, MD   40 mg at 09/17/22 1917   heparin ADULT infusion 100 units/mL (25000 units/293m)  1,000 Units/hr Intravenous Continuous ADelena Bali RPH 10 mL/hr at 09/17/22 1600 1,000 Units/hr at 09/17/22 1600   HYDROcodone-acetaminophen (NORCO/VICODIN) 5-325 MG per tablet 1 tablet  1 tablet Oral Q4H PRN PPara Skeans MD       ipratropium-albuterol (DUONEB) 0.5-2.5 (3) MG/3ML nebulizer solution 3 mL  3 mL Nebulization Q6H PRN PPara Skeans MD       metoprolol tartrate (LOPRESSOR) injection 2.5 mg  2.5 mg Intravenous Q6H PRN Foust, Katy L, NP       metoprolol tartrate (LOPRESSOR) tablet 12.5 mg  12.5 mg Oral BID Callwood, Dwayne D, MD       nitroGLYCERIN (NITROSTAT) SL tablet 0.4 mg  0.4 mg Sublingual Q5 min  PRN PPara Skeans MD       pantoprazole (PROTONIX) injection 40 mg  40 mg Intravenous Q12H PFlorina OuV, MD   40 mg at 09/17/22 1033   potassium chloride SA (KLOR-CON M) CR tablet 40 mEq  40 mEq Oral Once Rai, Ripudeep K, MD       promethazine (PHENERGAN) tablet 12.5 mg  12.5 mg Oral Q8H PRN Florina Ou V, MD   12.5 mg at 09/16/22 2158   sodium chloride flush (NS) 0.9 % injection 3 mL  3 mL Intravenous Q12H Para Skeans, MD   3 mL at 09/17/22 1032   Current Outpatient Medications  Medication Sig Dispense Refill   albuterol (VENTOLIN HFA) 108 (90 Base) MCG/ACT inhaler Inhale 1-2 puffs into the lungs every 6 (six) hours as needed for wheezing or shortness of breath.     allopurinol (ZYLOPRIM) 100 MG tablet Take 1 tablet (100 mg total) by mouth 2 (two) times daily. 60 tablet 5   Cholecalciferol (VITAMIN D) 50 MCG (2000 UT) CAPS Take 1 capsule (2,000 Units total) by mouth daily. 30 capsule 5   colchicine 0.6 MG tablet Take 1 tablet (0.6 mg total) by mouth daily. As directed 30 tablet 5   docusate sodium (COLACE) 100 MG capsule Take 100 mg by mouth 2 (two) times daily as needed for mild constipation or moderate constipation.     empagliflozin (JARDIANCE) 10 MG TABS tablet Take 10 mg by mouth daily.     furosemide (LASIX) 40 MG tablet TAKE 2 TABLETS BY MOUTH IN THE MORNING AND 1 TABLET AT NIGHT (Patient taking differently: 40 mg 2 (two) times daily.) 90 tablet 5   gabapentin (NEURONTIN) 600 MG tablet Take 0.5 tablets (300 mg total) by mouth 2 (two) times daily. (Patient taking differently: Take 600 mg by mouth 3 (three) times daily.) 60 tablet 0   ipratropium-albuterol (DUONEB) 0.5-2.5 (3) MG/3ML SOLN INHALE THE CONTENTS OF 1 VIAL(3MLS) VIA NEBULIZER 3 TIMES DAILY AS NEEDED 360 mL 1   metolazone (ZAROXOLYN) 2.5 MG tablet Take 5 mg by mouth 3 (three) times a week.     metoprolol tartrate (LOPRESSOR) 100 MG tablet Take 1 tablet (100 mg total) by mouth 2 (two) times daily. 60 tablet 3   nitroGLYCERIN  (NITROSTAT) 0.4 MG SL tablet Place 1 tablet (0.4 mg total) under the tongue every 5 (five) minutes as needed for chest pain. 30 tablet 12   omeprazole (PRILOSEC) 40 MG capsule Take 40 mg by mouth daily.     oxyCODONE ER 9 MG C12A Take 9 mg by mouth 2 (two) times daily.     potassium chloride (KLOR-CON) 10 MEQ tablet Take 4 tablets (40 mEq total) by mouth 2 (two) times daily. And additional 82mq on metolazone days (Patient taking differently: Take 40 mEq by mouth See admin instructions. Take 4 tablets (464m) by mouth twice daily - take an additional 2 tablets (2022m by mouth when taking metolazone) 64 tablet 5   promethazine (PHENERGAN) 12.5 MG tablet Take 1 tablet (12.5 mg total) by mouth every 8 (eight) hours as needed for nausea or vomiting. (Patient taking differently: Take 12.5 mg by mouth 2 (two) times daily as needed for nausea or vomiting.) 30 tablet 0   rivaroxaban (XARELTO) 20 MG TABS tablet Take 20 mg by mouth daily with supper.     rosuvastatin (CRESTOR) 10 MG tablet Take 10 mg by mouth daily.     Iron-FA-B Cmp-C-Biot-Probiotic (FUSION PLUS) CAPS Take 1 capsule by mouth daily. (Patient not taking: Reported on 09/16/2022) 30 capsule 3    PHYSICAL EXAMINATION:    Vitals:   09/17/22 1700 09/17/22 1919  BP: (!) 162/53 92/74  Pulse: (!) 124 (!) 116  Resp: (!) 36 18  Temp: 98.1  F (36.7 C) 98 F (36.7 C)  SpO2: 95% 98%   Filed Weights   09/15/22 1257  Weight: 264 lb 15.9 oz (120.2 kg)   Positive for jaundice  Physical Exam Vitals and nursing note reviewed.  HENT:     Head: Normocephalic and atraumatic.     Mouth/Throat:     Pharynx: Oropharynx is clear.  Eyes:     Extraocular Movements: Extraocular movements intact.     Pupils: Pupils are equal, round, and reactive to light.  Cardiovascular:     Rate and Rhythm: Normal rate and regular rhythm.  Pulmonary:     Comments: Decreased breath sounds bilaterally.  Abdominal:     Palpations: Abdomen is soft.   Musculoskeletal:        General: Normal range of motion.     Cervical back: Normal range of motion.  Skin:    General: Skin is warm.  Neurological:     General: No focal deficit present.     Mental Status: She is alert and oriented to person, place, and time.  Psychiatric:        Behavior: Behavior normal.        Judgment: Judgment normal.     LABORATORY DATA:  I have reviewed the data as listed Lab Results  Component Value Date   WBC 5.5 09/17/2022   WBC 5.4 09/17/2022   HGB 8.8 (L) 09/17/2022   HGB 8.8 (L) 09/17/2022   HCT 31.3 (L) 09/17/2022   HCT 32.1 (L) 09/17/2022   MCV 77.9 (L) 09/17/2022   MCV 79.3 (L) 09/17/2022   PLT 225 09/17/2022   PLT 228 09/17/2022   Recent Labs    09/15/22 1259 09/16/22 0325 09/17/22 0042 09/17/22 0434 09/17/22 1807  NA 137 139  --  138  --   K 3.7 3.5  --  3.2*  --   CL 93* 99  --  98  --   CO2 27 30  --  28  --   GLUCOSE 104* 80  --  78  --   BUN 47* 50*  --  45*  --   CREATININE 3.54* 3.52*  --  2.47*  --   CALCIUM 9.0 8.7*  --  8.3*  --   GFRNONAA 15* 15*  --  22*  --   PROT 7.4 6.4* 7.0 6.6  --   ALBUMIN 3.0* 2.7* 2.8* 2.6*  --   AST 57* 51* 55* 51*  --   ALT 35 35 35 32  --   ALKPHOS 80 68 75 72  --   BILITOT 9.6* 9.1* 9.8* 9.3* 9.3*  BILIDIR  --   --  5.5* 5.1* 5.2*  IBILI  --   --  4.3* 4.2* 4.1*    RADIOGRAPHIC STUDIES: I have personally reviewed the radiological images as listed and agreed with the findings in the report. ECHOCARDIOGRAM COMPLETE  Result Date: 09/17/2022    ECHOCARDIOGRAM REPORT   Patient Name:   Chelsea Ramsey Date of Exam: 09/17/2022 Medical Rec #:  [id number]                 Height:       64.0 in Accession #:    [id number]                Weight:       265.0 lb Date of Birth:  07-25-1966                 BSA:  2.205 m Patient Age:    81 years                  BP:           93/79 mmHg Patient Gender: F                         HR:           110 bpm. Exam Location:  [facility] Procedure:  2D Echo, Color Doppler and Cardiac Doppler Indications:     I50.31 CHF-Acute Diastolic  History:         Patient has prior history of Echocardiogram examinations, most                  recent 11/05/2018. CHF, CAD, COPD; Risk Factors:Hypertension,                  Diabetes and Sleep Apnea.  Sonographer:     Charmayne Sheer Referring Phys:  [id number] Hart TANG Diagnosing Phys: Yolonda Kida MD  Sonographer Comments: Suboptimal apical window and no subcostal window. Image acquisition challenging due to patient body habitus and Image acquisition challenging due to COPD. IMPRESSIONS  1. Left ventricular ejection fraction, by estimation, is 20 to 25%. The left ventricle has severely decreased function. The left ventricle demonstrates global hypokinesis. Left ventricular diastolic parameters are consistent with Grade III diastolic dysfunction (restrictive). There is the interventricular septum is flattened in systole and diastole, consistent with right ventricular pressure and volume overload.  2. Right ventricular systolic function is moderately reduced. The right ventricular size is moderately enlarged. Mildly increased right ventricular wall thickness.  3. Left atrial size was moderately dilated.  4. Right atrial size was severely dilated.  5. The mitral valve is abnormal. Mild to moderate mitral valve regurgitation.  6. Tricuspid valve regurgitation is severe.  7. The aortic valve is calcified. Aortic valve regurgitation is mild to moderate. Aortic valve sclerosis/calcification is present, without any evidence of aortic stenosis. Conclusion(s)/Recommendation(s): Poor windows for evaluation of left ventricular function by transthoracic echocardiography. Would recommend an alternative means of evaluation. FINDINGS  Left Ventricle: Left ventricular ejection fraction, by estimation, is 20 to 25%. The left ventricle has severely decreased function. The left ventricle demonstrates global hypokinesis. The left  ventricular internal cavity size was normal in size. There is borderline concentric left ventricular hypertrophy. The interventricular septum is flattened in systole and diastole, consistent with right ventricular pressure and volume overload. Left ventricular diastolic parameters are consistent with Grade III diastolic dysfunction (restrictive). Right Ventricle: The right ventricular size is moderately enlarged. Mildly increased right ventricular wall thickness. Right ventricular systolic function is moderately reduced. Left Atrium: Left atrial size was moderately dilated. Right Atrium: Right atrial size was severely dilated. Pericardium: There is no evidence of pericardial effusion. Mitral Valve: The mitral valve is abnormal. Mild to moderate mitral valve regurgitation. Tricuspid Valve: The tricuspid valve is grossly normal. Tricuspid valve regurgitation is severe. Aortic Valve: The aortic valve is calcified. Aortic valve regurgitation is mild to moderate. Aortic regurgitation PHT measures 233 msec. Aortic valve sclerosis/calcification is present, without any evidence of aortic stenosis. Aortic valve mean gradient measures 6.0 mmHg. Aortic valve peak gradient measures 11.3 mmHg. Aortic valve area, by VTI measures 1.14 cm. Pulmonic Valve: The pulmonic valve was normal in structure. Pulmonic valve regurgitation is mild to moderate. Aorta: The ascending aorta was not well visualized. IAS/Shunts: No atrial level shunt detected by color flow  Doppler.  LEFT VENTRICLE PLAX 2D LVIDd:         3.50 cm LVIDs:         3.20 cm LV PW:         1.20 cm LV IVS:        0.90 cm LVOT diam:     1.80 cm LV SV:         25 LV SV Index:   12 LVOT Area:     2.54 cm  RIGHT VENTRICLE RV Basal diam:  5.30 cm RV Mid diam:    5.00 cm RV S prime:     7.62 cm/s LEFT ATRIUM         Index       RIGHT ATRIUM           Index LA diam:    5.50 cm 2.49 cm/m  RA Area:     28.50 cm                                 RA Volume:   104.00 ml 47.17 ml/m   AORTIC VALVE                     PULMONIC VALVE AV Area (Vmax):    0.87 cm      PV Vmax:          0.98 m/s AV Area (Vmean):   0.90 cm      PV Vmean:         69.200 cm/s AV Area (VTI):     1.14 cm      PV VTI:           0.128 m AV Vmax:           168.00 cm/s   PV Peak grad:     3.9 mmHg AV Vmean:          116.000 cm/s  PV Mean grad:     2.5 mmHg AV VTI:            0.223 m       PR End Diast Vel: 10.24 msec AV Peak Grad:      11.3 mmHg AV Mean Grad:      6.0 mmHg LVOT Vmax:         57.60 cm/s LVOT Vmean:        40.900 cm/s LVOT VTI:          0.100 m LVOT/AV VTI ratio: 0.45 AI PHT:            233 msec  AORTA Ao Root diam: 3.10 cm MITRAL VALVE               TRICUSPID VALVE MV Area (PHT): 5.95 cm    TR Peak grad:   55.7 mmHg MV Decel Time: 128 msec    TR Vmax:        373.00 cm/s MV E velocity: 87.60 cm/s                            SHUNTS                            Systemic VTI:  0.10 m  Systemic Diam: 1.80 cm Yolonda Kida MD Electronically signed by Yolonda Kida MD Signature Date/Time: 09/17/2022/4:54:34 PM    Final    DG Chest 1 View  Result Date: 09/16/2022 CLINICAL DATA:  Worsening shortness of breath. 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 Cardiomegaly which has increased from 2021. Slight globular configuration of the heart, query pericardial effusion. Suspect small right pleural effusion. Chronic bilateral lung opacities that appears stable. No acute airspace disease or pneumothorax. On limited assessment, no acute osseous findings. IMPRESSION: 1. Increased cardiomegaly from 2021. Slight globular configuration of the heart, query pericardial effusion. 2. Suspect small right pleural effusion. 3. Stable chronic bilateral lung opacities. Electronically Signed   By: Keith Rake M.D.   On: 09/16/2022 17:21   MR ABDOMEN MRCP WO  CONTRAST  Result Date: 09/16/2022 CLINICAL DATA:  56 year old female with history of jaundice. Nausea. EXAM: MRI ABDOMEN WITHOUT CONTRAST  (INCLUDING MRCP) TECHNIQUE: Multiplanar multisequence MR imaging of the abdomen was performed. Heavily T2-weighted images of the biliary and pancreatic ducts were obtained, and three-dimensional MRCP images were rendered by post processing. COMPARISON:  No prior abdominal MRI or MRCP. Abdominal ultrasound 09/15/2022. FINDINGS: Comment: Portions of today's examination are substantially limited by patient respiratory motion. The study is also severely limited for detection and characterization of visceral and/or vascular lesions by lack of IV gadolinium. Lower chest: Severe cardiomegaly. Massively distended inferior vena cava. Hepatobiliary: No definite suspicious cystic or solid hepatic lesions are confidently identified on today's motion limited noncontrast examination. No intra or extrahepatic biliary ductal dilatation noted on MRCP images. Common bile duct measures only 3 mm in the porta hepatis. No filling defect within the common bile duct to suggest choledocholithiasis. T1 hyperintense, T2 hypointense material lies dependently within the gallbladder. Gallbladder does not appear overly distended. Accurate assessment for gallbladder wall thickening is limited on today's examination secondary to motion, but there may be some very mild gallbladder wall thickening and edema. No overt pericholecystic fluid or surrounding inflammatory changes. Pancreas: No definite pancreatic mass or peripancreatic fluid collections or inflammatory changes noted on today's noncontrast examination. No pancreatic ductal dilatation. Spleen:  Unremarkable. Adrenals/Urinary Tract: Unenhanced appearance of the kidneys and bilateral adrenal glands is normal. No hydroureteronephrosis in the visualized portions of the abdomen. Stomach/Bowel: Visualized portions are unremarkable. Vascular/Lymphatic: No  aneurysm identified in the visualized abdominal vasculature. No lymphadenopathy noted in the abdomen. Other: No significant volume of ascites noted in the visualized portions of the peritoneal cavity. Musculoskeletal: No aggressive appearing osseous lesions are noted in the visualized portions of the skeleton. IMPRESSION: 1. Biliary sludge lying dependently in the gallbladder. Gallbladder wall may be mildly edematous, but is poorly evaluated on today's motion limited examination. If there is any clinical concern for acute cholecystitis, further evaluation with right upper quadrant abdominal ultrasound or nuclear medicine hepatobiliary scan should be considered at this time. 2. No biliary tract dilatation to suggest obstruction. No evidence of choledocholithiasis. 3. Severe cardiomegaly. Electronically Signed   By: Vinnie Langton M.D.   On: 09/16/2022 05:28   US ABDOMEN LIMITED RUQ (LIVER/GB)  Result Date: 09/15/2022 CLINICAL DATA:  Elevated bilirubin EXAM: ULTRASOUND ABDOMEN LIMITED RIGHT UPPER QUADRANT COMPARISON:  CT abdomen and pelvis 12/01/2018 FINDINGS: Gallbladder: Sludge is present within the gallbladder. Small amount of pericholecystic fluid. The gallbladder wall is mildly thickened measuring 5 mm. Sonographic Murphy's sign was positive. Common bile duct: Diameter:  3 mm Liver: No focal lesion identified. Increased echogenicity. Portal vein is patent on Doppler imaging with bidirectional flow. Other: None. IMPRESSION: 1. Findings compatible with acute cholecystitis. 2. 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 Electronically Signed   By: Placido Sou M.D.   On: 09/15/2022 23:07    Chelsea Ramsey 56 y.o.  female with multiple medical problems including hypertension COPD chronic O2 supplementation type 2 diabetes morbid obesity CHF; history of mitral valve replacement [July 2019]; history of TIAs- on xarelto is currently admitted  to hospital for worsening abdominal pain-noted to have acute renal failure; anemia and elevated bilirubin  #Moderate anemia-chronic slightly worse; elevated LDH reticulocyte count.  Rule out hemolysis-pending haptoglobin.  Rule out TTP HUS.  Low clinical suspicion for TTP/HUS.  Again low clinical suspicion for-valvular cause of hemolytic anemia.  #Elevated bilirubin-both direct and indirect-clinically concerning for congestive hepatopathy.  S/p GI evaluation.  #History of mitral valve repair; CAD CHF on Xarelto.  Echo shows -mitral/tricuspid regurgitation.  S/p cardiology evaluation.  #Acute renal failure?  Prerenal/diuretics   #Recommendations:   # We will order review of peripheral smear to rule out schistocytes.   #Await haptoglobin levels.  Recommend checking DAT.  Plasma hemoglobin.   Thank you Dr. Tana Coast for allowing me to participate in the care of your pleasant patient. Please do not hesitate to contact me with questions or concerns in the interim.   Addendum:  discussed with Dr. Ronnald Ramp pathology-no evidence of schistocytes.  Smear suggestive of-target cells secondary to liver/renal dysfunction. Above plan of care was discussed with patient in detail.  My contact information was given to the patient/family.     Cammie Sickle, MD 09/17/2022 9:27 PM

## 2022-09-17 NOTE — Progress Notes (Signed)
[facility]                                                                              Chelsea Ramsey, is a 56 y.o. female, DOB - [birth year], JKD:[id number] Admit date - 09/15/2022    Outpatient Primary MD for the patient is Emelia Loron, NP  LOS - 1  days  Chief Complaint  Patient presents with   Renal Problem       Brief summary   Patient is 56 year old female with DM, pulm HTN, COPD, CHF, GERD, OSA was seen by PCP on day of admission for not feeling well and abd pain for about a month. Patient complained of pain in RUQ and right flank with nausea.   abnormal outpatient labs showing AKI.   In ED, her labwork confirmed AKI with Cr 3.54, total bilirubin of 9.6.  AST mildly elevated to 57, ALT 35, Alk Phos 80.  U/S was done which showed gallbladder wall thickening to 5 mm with pericholecystic fluid, suspicious for acute cholecystitis.  CBD per U/S was 3 mm.   Assessment & Plan    Principal Problem:   Right sided abdominal pain, transaminitis, hyperbilirubinemia - unclear etiology, suspected cholecystitis.  Surgery was consulted, recommended MRCP - MRCP showed biliary sludge, GB mild edematous. No biliary tract dilation to suggest obstruction.  No choledocholithiasis. 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 with extensive comorbidities including valvular heart disease, A-fib, pulmonary HTN, CHF, CAD) -GI following, recommended supportive care, monitor LFTs -AST mildly improving, total bi slightly improving today   Active Problems:    AKI (acute kidney injury) ([place]) -Baseline creatinine 0.78 on 01/02/2022, presented with creatinine of 3.54 on 09/15/2022 -Creatinine improving, 2.4, continue IV fluid hydration     Iron deficiency anemia -Hemoglobin 9.8 on 11/02/2020, on admission 9.9 -Patient had endoscopy 06/29/2022 which had shown erythematous mucosa in the antrum, normal duodenal bulb and second portion of  duodenum.  Colonoscopy had shown melanosis coli but otherwise normal.  -Hemoglobin trended down to 8.8 with increased immature reticulocyte, LDH, haptoglobin process, hyperbilirubinemia, ?  Hemolytic complement -Follow-up fractionated bilirubin, gamma GT -Hematology oncology consulted     HTN (hypertension) -Borderline BP, continue to hold antihypertensives      A-fib (HCC),  Chronic diastolic heart failure [facility]) -Cardiology consulted, follow 2D echo -Continue IV heparin drip for now    Tobacco dependence -Counseled on smoking cessation  Morbid obesity Estimated body mass index is 45.49 kg/m as calculated from the following:   Height as of this encounter: _0  (1.626 m).   Weight as of this encounter: 120.2 kg.  Code Status: Full DVT Prophylaxis:    IV heparin drip   Level of Care: Level of care: Progressive Family Communication: Updated patient  Disposition Plan:      Remains inpatient appropriate: Work-up in progress   Procedures:  MRCP  Consultants:   General surgery GI Cardiology Hematology  Antimicrobials: None    Medications  allopurinol  100 mg Oral Daily   docusate sodium  100 mg Oral BID   pantoprazole (PROTONIX) IV  40 mg Intravenous Q12H  potassium chloride  40 mEq Oral Once   sodium chloride flush  3 mL Intravenous Q12H      Subjective:   Chelsea Ramsey was seen and examined today.  Right-sided abdominal pain but no acute nausea vomiting fevers or chills today.  Objective:   Vitals:   09/17/22 1015 09/17/22 1027 09/17/22 1030 09/17/22 1200  BP: 105/80     Pulse: (!) 114 (!) 115 (!) 126   Resp: _0 Temp:    98.2 F (36.8 C)  TempSrc:    Oral  SpO2: 96% 96% 97%   Weight:      Height:        Intake/Output Summary (Last 24 hours) at 09/17/2022 1345 Last data filed at 09/17/2022 0925 Gross per 24 hour  Intake 1833.46 ml  Output --  Net 1833.46 ml     Wt Readings from Last 3 Encounters:  09/15/22 120.2 kg  06/29/22  120.2 kg  12/18/21 127.5 kg   Physical Exam General: Alert and oriented x 3, NAD, tearful Cardiovascular: S1 S2 clear, RRR.  Respiratory: CTAB Gastrointestinal: Soft, mild diffuse TTP , nondistended, NBS Ext: no pedal edema bilaterally Neuro: no new deficits Psych: anxious and tearful today    Data Reviewed:  I have personally reviewed following labs    CBC Lab Results  Component Value Date   WBC 5.5 09/17/2022   RBC 4.02 09/17/2022   HGB 8.8 (L) 09/17/2022   HCT 31.3 (L) 09/17/2022   MCV 77.9 (L) 09/17/2022   MCH 21.9 (L) 09/17/2022   PLT 225 09/17/2022   MCHC 28.1 (L) 09/17/2022   RDW 23.2 (H) 09/17/2022   LYMPHSABS 1.2 09/15/2022   MONOABS 0.5 09/15/2022   EOSABS 0.0 09/15/2022   BASOSABS 0.0 25/49/8264     Last metabolic panel Lab Results  Component Value Date   NA 138 09/17/2022   K 3.2 (L) 09/17/2022   CL 98 09/17/2022   CO2 28 09/17/2022   BUN 45 (H) 09/17/2022   CREATININE 2.47 (H) 09/17/2022   GLUCOSE 78 09/17/2022   GFRNONAA 22 (L) 09/17/2022   GFRAA >60 01/31/2020   CALCIUM 8.3 (L) 09/17/2022   PHOS 4.1 12/03/2018   PROT 6.6 09/17/2022   ALBUMIN 2.6 (L) 09/17/2022   LABGLOB 2.9 10/10/2018   AGRATIO 1.5 10/10/2018   BILITOT 9.3 (H) 09/17/2022   ALKPHOS 72 09/17/2022   AST 51 (H) 09/17/2022   ALT 32 09/17/2022   ANIONGAP 12 09/17/2022    CBG (last 3)  No results for input(s): "GLUCAP" in the last 72 hours.    Coagulation Profile: Recent Labs  Lab 09/16/22 0942 09/17/22 0434  INR 2.0* 2.1*     Radiology Studies: I have personally reviewed the imaging studies  DG Chest 1 View  Result Date: 09/16/2022 CLINICAL DATA:  Worsening shortness of breath. 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 Cardiomegaly which has increased from 2021. Slight globular configuration of the heart, query pericardial effusion.  Suspect small right pleural effusion. Chronic bilateral lung opacities that appears stable. No acute airspace disease or pneumothorax. On limited assessment, no acute osseous findings. IMPRESSION: 1. Increased cardiomegaly from 2021. Slight globular configuration of the heart, query pericardial effusion. 2. Suspect small right pleural effusion. 3. Stable chronic bilateral lung opacities. Electronically Signed   By: Keith Rake M.D.   On: 09/16/2022 17:21   MR ABDOMEN MRCP  WO CONTRAST  Result Date: 09/16/2022 CLINICAL DATA:  56 year old female with history of jaundice. Nausea. EXAM: MRI ABDOMEN WITHOUT CONTRAST  (INCLUDING MRCP) TECHNIQUE: Multiplanar multisequence MR imaging of the abdomen was performed. Heavily T2-weighted images of the biliary and pancreatic ducts were obtained, and three-dimensional MRCP images were rendered by post processing. COMPARISON:  No prior abdominal MRI or MRCP. Abdominal ultrasound 09/15/2022. FINDINGS: Comment: Portions of today's examination are substantially limited by patient respiratory motion. The study is also severely limited for detection and characterization of visceral and/or vascular lesions by lack of IV gadolinium. Lower chest: Severe cardiomegaly. Massively distended inferior vena cava. Hepatobiliary: No definite suspicious cystic or solid hepatic lesions are confidently identified on today's motion limited noncontrast examination. No intra or extrahepatic biliary ductal dilatation noted on MRCP images. Common bile duct measures only 3 mm in the porta hepatis. No filling defect within the common bile duct to suggest choledocholithiasis. T1 hyperintense, T2 hypointense material lies dependently within the gallbladder. Gallbladder does not appear overly distended. Accurate assessment for gallbladder wall thickening is limited on today's examination secondary to motion, but there may be some very mild gallbladder wall thickening and edema. No overt pericholecystic  fluid or surrounding inflammatory changes. Pancreas: No definite pancreatic mass or peripancreatic fluid collections or inflammatory changes noted on today's noncontrast examination. No pancreatic ductal dilatation. Spleen:  Unremarkable. Adrenals/Urinary Tract: Unenhanced appearance of the kidneys and bilateral adrenal glands is normal. No hydroureteronephrosis in the visualized portions of the abdomen. Stomach/Bowel: Visualized portions are unremarkable. Vascular/Lymphatic: No aneurysm identified in the visualized abdominal vasculature. No lymphadenopathy noted in the abdomen. Other: No significant volume of ascites noted in the visualized portions of the peritoneal cavity. Musculoskeletal: No aggressive appearing osseous lesions are noted in the visualized portions of the skeleton. IMPRESSION: 1. Biliary sludge lying dependently in the gallbladder. Gallbladder wall may be mildly edematous, but is poorly evaluated on today's motion limited examination. If there is any clinical concern for acute cholecystitis, further evaluation with right upper quadrant abdominal ultrasound or nuclear medicine hepatobiliary scan should be considered at this time. 2. No biliary tract dilatation to suggest obstruction. No evidence of choledocholithiasis. 3. Severe cardiomegaly. Electronically Signed   By: Vinnie Langton M.D.   On: 09/16/2022 05:28   US ABDOMEN LIMITED RUQ (LIVER/GB)  Result Date: 09/15/2022 CLINICAL DATA:  Elevated bilirubin EXAM: ULTRASOUND ABDOMEN LIMITED RIGHT UPPER QUADRANT COMPARISON:  CT abdomen and pelvis 12/01/2018 FINDINGS: Gallbladder: Sludge is present within the gallbladder. Small amount of pericholecystic fluid. The gallbladder wall is mildly thickened measuring 5 mm. Sonographic Murphy's sign was positive. Common bile duct: Diameter: 3 mm Liver: No focal lesion identified. Increased echogenicity. Portal vein is patent on Doppler imaging with bidirectional flow. Other: None. IMPRESSION: 1.  Findings compatible with acute cholecystitis. 2. 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 Electronically Signed   By: Placido Sou M.D.   On: 09/15/2022 23:07       Jayle Solarz M.D. [facility] 09/17/2022, 1:45 PM  Available via Epic secure chat 7am-7pm After 7 pm, please refer to night coverage provider listed on amion.

## 2022-09-17 NOTE — Progress Notes (Signed)
GI Inpatient Follow-up Note  Subjective:  Patient seen in follow-up for isolated hyperbilirubinemia. No acute events overnight. She continues to endorse diffuse abdominal pain about the same as yesterday. She denies any overt gastrointestinal blood loss. No nausea, vomiting, or dysphagia. No family bedside this morning at time of encounter. She offers no specific complaints today.   Scheduled Inpatient Medications:   allopurinol  100 mg Oral Daily   docusate sodium  100 mg Oral BID   pantoprazole (PROTONIX) IV  40 mg Intravenous Q12H   potassium chloride  40 mEq Oral Once   sodium chloride flush  3 mL Intravenous Q12H    Continuous Inpatient Infusions:    sodium chloride Stopped (09/16/22 1410)   sodium chloride 75 mL/hr at 09/17/22 0925   heparin      PRN Inpatient Medications:  acetaminophen **OR** acetaminophen, albuterol, fentaNYL (SUBLIMAZE) injection, HYDROcodone-acetaminophen, ipratropium-albuterol, metoprolol tartrate, nitroGLYCERIN, promethazine  Review of Systems: Constitutional: Weight is stable.  Eyes: No changes in vision. ENT: No oral lesions, sore throat.  GI: see HPI.  Heme/Lymph: No easy bruising.  CV: No chest pain.  GU: No hematuria.  Integumentary: No rashes.  Neuro: No headaches.  Psych: No depression/anxiety.  Endocrine: No heat/cold intolerance.  Allergic/Immunologic: No urticaria.  Resp: No cough, SOB.  Musculoskeletal: No joint swelling.    Physical Examination: BP 105/80   Pulse (!) 126   Temp 98.1 F (36.7 C) (Oral)   Resp 14   Ht '5\' 4"'$  (1.626 m)   Wt 120.2 kg   LMP 11/08/2009 Comment: menopause  SpO2 97%   BMI 45.49 kg/m  Gen: NAD, alert and oriented x 4, tearful HEENT: PEERLA, EOMI, +scleral icterus  Neck: supple, no JVD or thyromegaly Chest: CTA bilaterally, no wheezes, crackles, or other adventitious sounds CV: RRR, no m/g/c/r Abd: soft, nondistended, obese abdomen, normoactive bowel sounds, tender to light and deep palpation  in all four quadrants out of proportion to exam, no rebound or guarding, no HSM or abdominal masses Ext: no edema, well perfused with 2+ pulses, Skin: no rash or lesions noted Lymph: no LAD  Data: Lab Results  Component Value Date   WBC 5.5 09/17/2022   HGB 8.8 (L) 09/17/2022   HCT 31.3 (L) 09/17/2022   MCV 77.9 (L) 09/17/2022   PLT 225 09/17/2022   Recent Labs  Lab 09/15/22 1259 09/16/22 0325 09/17/22 0434  HGB 9.9* 9.2* 8.8*   Lab Results  Component Value Date   NA 138 09/17/2022   K 3.2 (L) 09/17/2022   CL 98 09/17/2022   CO2 28 09/17/2022   BUN 45 (H) 09/17/2022   CREATININE 2.47 (H) 09/17/2022   GLU 79 08/21/2014   Lab Results  Component Value Date   ALT 32 09/17/2022   AST 51 (H) 09/17/2022   ALKPHOS 72 09/17/2022   BILITOT 9.3 (H) 09/17/2022   Recent Labs  Lab 09/17/22 0434 09/17/22 1002  APTT  --  145*  INR 2.1*  --     Assessment/Plan:  56 y/o female with a PMH of HTN, COPD on chronic O2 supplementation, T2DM, CHF, PAF on chronic anticoagulation s/p ablation, hx of mitral valve replacement 05/2018, GERD, and OSA presented to the [facility] ED yesterday afternoon for chief complaint of abnormal blood work at [employer] office which showed AKI and hyperbilirubinemia. She endorses a 2-monthhistory of poor PO intake, decreased appetite, intractable nausea, and diffuse, non-specific abdominal pain. GI consulted in context of isolated hyperbilirubinemia.    Isolated hyperbilirubinemia  Diffuse abdominal pain - non-specific, 2/2 acute gastroenteritis, cholecystitis, functional, MSK, neuropathic, etc   AKI - likely in setting of hypovolemia and dehydration   Right heart strain - echo this morning pending    COPD on chronic O2 supplementation   PAF on chronic anticoagulation    Morbid obesity   Recommendations:  - Follow-up on serologies ordered yesterday. Labs are suspicious for possible underlying hemolytic anemia (decreased hemoglobin, increased LHD,  increase immature reticulocyte %, haptoglobin in process) - Consider hematology consult for further work-up of hemolytic anemia - No further GI interventions needed at this time - Continue supportive care with antiemetics prn, pain control, and IV fluid hydration - Follow-up results of echo - Continue management of comorbidities per primary team - Continue to monitor LFTs - GI will sign off and follow peripherally. Please call back if concerns or questions.    Octavia Bruckner, PA-C [facility] [phone]

## 2022-09-17 NOTE — Progress Notes (Signed)
OT Cancellation Note  Patient Details Name: Chelsea Ramsey MRN: [id number] DOB: Jul 04, 1966   Cancelled Treatment:    Reason Eval/Treat Not Completed: Other (comment). OT order received and chart reviewed. OT arrived to room and provider asking therapist to return at another time. OT to re-attempt at next available time.   Darleen Crocker, MS, OTR/L , CBIS ascom [phone]  09/17/22, 2:50 PM

## 2022-09-17 NOTE — ED Notes (Signed)
Meal tray and roomed cleaned up. Pt provided with ice and grape juice

## 2022-09-17 NOTE — Consult Note (Signed)
ANTICOAGULATION CONSULT NOTE  Pharmacy Consult for Heparin infusion Indication: atrial fibrillation  Allergies  Allergen Reactions   Amiodarone Nausea And Vomiting   Aspirin Swelling   Flexeril [Cyclobenzaprine] Swelling   Trazamine [Trazodone & Diet Manage Prod] Nausea And Vomiting   Codeine Rash   Tramadol Rash    Patient Measurements: Height: '5\' 4"'$  (162.6 cm) Weight: 120.2 kg (264 lb 15.9 oz) IBW/kg (Calculated) : 54.7 Heparin Dosing Weight: 83.9 kg  Vital Signs: Temp: 98.1 F (36.7 C) (10/19 1700) Temp Source: Oral (10/19 1700) BP: 162/53 (10/19 1700) Pulse Rate: 124 (10/19 1700)  Labs: Recent Labs    09/15/22 1259 09/16/22 0325 09/16/22 0942 09/16/22 0942 09/16/22 1919 09/17/22 0242 09/17/22 0434 09/17/22 1002 09/17/22 1807  HGB 9.9* 9.2*  --   --   --   --  8.8*  8.8*  --   --   HCT 35.6* 32.8*  --   --   --   --  32.1*  31.3*  --   --   PLT 269 244  --   --   --   --  228  225  --   --   APTT  --   --  36   < > 150* 175*  --  145* 91*  LABPROT  --   --  22.4*  --   --   --  23.2*  --   --   INR  --   --  2.0*  --   --   --  2.1*  --   --   HEPARINUNFRC  --   --  <0.10*   < > 0.28* 0.33  --  0.32  --   CREATININE 3.54* 3.52*  --   --   --   --  2.47*  --   --    < > = values in this interval not displayed.     Estimated Creatinine Clearance: 32.9 mL/min (A) (by C-G formula based on SCr of 2.47 mg/dL (H)).   Medical History: Past Medical History:  Diagnosis Date   Acute drug-induced gout of left foot 03/01/2018   Last Assessment & Plan:  Likely at least partially brought on by diuresis.  Needs to continue to diurese  Will push hydration Stop allopurinol given initiation during acute flare may worsen this, re-broach this when asymptomatic Avoid nsaids given stomach pain Trial colchicine Add acetaminophen Ice, elevate, rest   Allergy    seasonal   Anxiety    Arthritis    Right Knee   Asthma    CHF (congestive heart failure) (HCC)    COPD  (chronic obstructive pulmonary disease) (HCC)    Coronary artery disease    Leaky heart valve   Diabetes mellitus without complication (HCC)    Dysrhythmia    Fibromyalgia    GERD (gastroesophageal reflux disease)    Hypertension    Pneumonia    PUD (peptic ulcer disease)    Pulmonary HTN (HCC)    Rheumatic fever/heart disease    Sleep apnea     Medications:  Patient on Xarelto '20mg'$  prior to admission (dx A.fib) last dose 10/16  Assessment: 56 yo F with PMH of HF, COPD, CAD, DM, HTN, and Afib (on Xarelto) presents to ED with RUQ / Rt flank/ Rt side of abdomen involved. Pharmacy consulted to manage heparin infusion during inpatient stay. Potential surgical intervention needed. Last Xarelto dose 09/14/2022 per patient history. Pt in significant AKI so if Xarelto is restarted  and renal function hasn't improved, would need to consider a dose reduction to 15 mg daily.  Baseline Labs: aPTT - 36; INR - 2.0 Hgb - 9.9; Plts - 225  Date Time aPTT/HL   Rate/Comment 10/18 1919 aPTT 150 / HL 0.28  aPTT supratherapeutic; aPTT and HL not correlating 10/19 0242 aPTT 175 / HL 0.33  aPTT supratherapeutic; aPTT and HL not correlating 10/19 1002 aPTT 145 / HL 0.32  aPTT supratherapeutic; aPTT and HL not correlating 10/19 1807 aPTT 91   aPTT therapeutic.  Goal of Therapy:  Heparin level 0.3-0.7 units/ml aPTT 66-102 seconds Monitor platelets by anticoagulation protocol: Yes   Plan:  aPTT is therapeutic. Will continue heparin at 1000 units/hr. Recheck aPTT in 6 hours. Heparin level and CBC daily while on heparin infusion. Switch to heparin level once aPTT and heparin level correlate.   Eleonore Chiquito, PharmD, BCPS 09/17/2022 6:39 PM

## 2022-09-17 NOTE — Consult Note (Signed)
ANTICOAGULATION CONSULT NOTE  Pharmacy Consult for Heparin infusion Indication: atrial fibrillation  Allergies  Allergen Reactions   Amiodarone Nausea And Vomiting   Aspirin Swelling   Flexeril [Cyclobenzaprine] Swelling   Trazamine [Trazodone & Diet Manage Prod] Nausea And Vomiting   Codeine Rash   Tramadol Rash    Patient Measurements: Height: '5\' 4"'$  (162.6 cm) Weight: 120.2 kg (264 lb 15.9 oz) IBW/kg (Calculated) : 54.7 Heparin Dosing Weight: 83.9 kg  Vital Signs: Temp: 98.1 F (36.7 C) (10/19 0800) Temp Source: Oral (10/19 0800) BP: 105/80 (10/19 1015) Pulse Rate: 126 (10/19 1030)  Labs: Recent Labs    09/15/22 1259 09/16/22 0325 09/16/22 0942 09/16/22 0942 09/16/22 1919 09/17/22 0242 09/17/22 0434 09/17/22 1002  HGB 9.9* 9.2*  --   --   --   --  8.8*  --   HCT 35.6* 32.8*  --   --   --   --  31.3*  --   PLT 269 244  --   --   --   --  225  --   APTT  --   --  36   < > 150* 175*  --  145*  LABPROT  --   --  22.4*  --   --   --  23.2*  --   INR  --   --  2.0*  --   --   --  2.1*  --   HEPARINUNFRC  --   --  <0.10*   < > 0.28* 0.33  --  0.32  CREATININE 3.54* 3.52*  --   --   --   --  2.47*  --    < > = values in this interval not displayed.     Estimated Creatinine Clearance: 32.9 mL/min (A) (by C-G formula based on SCr of 2.47 mg/dL (H)).   Medical History: Past Medical History:  Diagnosis Date   Acute drug-induced gout of left foot 03/01/2018   Last Assessment & Plan:  Likely at least partially brought on by diuresis.  Needs to continue to diurese  Will push hydration Stop allopurinol given initiation during acute flare may worsen this, re-broach this when asymptomatic Avoid nsaids given stomach pain Trial colchicine Add acetaminophen Ice, elevate, rest   Allergy    seasonal   Anxiety    Arthritis    Right Knee   Asthma    CHF (congestive heart failure) (HCC)    COPD (chronic obstructive pulmonary disease) (HCC)    Coronary artery disease     Leaky heart valve   Diabetes mellitus without complication (HCC)    Dysrhythmia    Fibromyalgia    GERD (gastroesophageal reflux disease)    Hypertension    Pneumonia    PUD (peptic ulcer disease)    Pulmonary HTN (HCC)    Rheumatic fever/heart disease    Sleep apnea     Medications:  Patient on Xarelto '20mg'$  prior to admission (dx A.fib) last dose 10/16  Assessment: 56 yo F with PMH of HF, COPD, CAD, DM, HTN, and Afib (on Xarelto) presents to ED with RUQ / Rt flank/ Rt side of abdomen involved. Pharmacy consulted to manage heparin infusion during inpatient stay. Potential surgical intervention needed. Last Xarelto dose 09/14/2022 per patient history. Pt in significant AKI so if Xarelto is restarted and renal function hasn't improved, would need to consider a dose reduction to 15 mg daily.  Baseline Labs: aPTT - 36; INR - 2.0 Hgb - 9.9; Plts -  225  Date Time aPTT/HL   Rate/Comment 10/18 1919 aPTT 150 / HL 0.28  aPTT supratherapeutic; aPTT and HL not correlating 10/19 0242 aPTT 175 / HL 0.33  aPTT supratherapeutic; aPTT and HL not correlating 10/19 1002 aPTT 145 / HL 0.32  aPTT supratherapeutic; aPTT and HL not correlating  Goal of Therapy:  Heparin level 0.3-0.7 units/ml aPTT 66-102 seconds Monitor platelets by anticoagulation protocol: Yes   Plan:  Hold heparin infusion for 1 hour (spoke with RN) and then decrease to 1,000 un/hr Check aPTT/Anti-Xa level in 6 hours and daily once consecutively therapeutic.  Titrate by aPTT's until lab correlation is noted, then titrate by anti-xa alone. Continue to monitor H&H and platelets daily while on heparin gtt.  Dara Hoyer, PharmD PGY-1 Pharmacy Resident 09/17/2022 10:53 AM

## 2022-09-17 NOTE — ED Notes (Signed)
Family member phone numbers added to the chart  [phone]  [place] [phone] [facility]

## 2022-09-17 NOTE — Consult Note (Signed)
ANTICOAGULATION CONSULT NOTE  Pharmacy Consult for Heparin infusion Indication: atrial fibrillation  Allergies  Allergen Reactions   Amiodarone Nausea And Vomiting   Aspirin Swelling   Flexeril [Cyclobenzaprine] Swelling   Trazamine [Trazodone & Diet Manage Prod] Nausea And Vomiting   Codeine Rash   Tramadol Rash    Patient Measurements: Height: '5\' 4"'$  (162.6 cm) Weight: 120.2 kg (264 lb 15.9 oz) IBW/kg (Calculated) : 54.7 Heparin Dosing Weight: 83.9 kg  Vital Signs: Temp: 98.4 F (36.9 C) (10/18 2317) Temp Source: Oral (10/18 2317) BP: 91/70 (10/19 0230) Pulse Rate: 97 (10/19 0230)  Labs: Recent Labs    09/15/22 1259 09/16/22 0325 09/16/22 0942 09/16/22 1919 09/17/22 0242  HGB 9.9* 9.2*  --   --   --   HCT 35.6* 32.8*  --   --   --   PLT 269 244  --   --   --   APTT  --   --  36 150*  --   LABPROT  --   --  22.4*  --   --   INR  --   --  2.0*  --   --   HEPARINUNFRC  --   --  <0.10* 0.28* 0.33  CREATININE 3.54* 3.52*  --   --   --      Estimated Creatinine Clearance: 23.1 mL/min (A) (by C-G formula based on SCr of 3.52 mg/dL (H)).   Medical History: Past Medical History:  Diagnosis Date   Acute drug-induced gout of left foot 03/01/2018   Last Assessment & Plan:  Likely at least partially brought on by diuresis.  Needs to continue to diurese  Will push hydration Stop allopurinol given initiation during acute flare may worsen this, re-broach this when asymptomatic Avoid nsaids given stomach pain Trial colchicine Add acetaminophen Ice, elevate, rest   Allergy    seasonal   Anxiety    Arthritis    Right Knee   Asthma    CHF (congestive heart failure) (HCC)    COPD (chronic obstructive pulmonary disease) (HCC)    Coronary artery disease    Leaky heart valve   Diabetes mellitus without complication (HCC)    Dysrhythmia    Fibromyalgia    GERD (gastroesophageal reflux disease)    Hypertension    Pneumonia    PUD (peptic ulcer disease)    Pulmonary HTN  (HCC)    Rheumatic fever/heart disease    Sleep apnea     Medications:  Patient on Xarelto '20mg'$  prior to admission (dx A.fib)  Assessment: Patient with PMH of HF, COPD, CAD, DM, HTN, and Afib (on Xarelto) presents to ED with RUQ / Rt flank/ Rt side of abdomen involved. Pharmacy consulted to manage heparin infusion during inpatient stay. Potential surgical intervention needed.  Baseline aPTT, INR and HL ordered and collceted.  Hgb and plt appropriate to start heparin therapy. No emergent surgery needed. Noted AKI on admission.  Last Xarleto dose 09/14/2022 per patient history  Baseline HL <0.10,  aPTT 36   INR 2.0  10/18 1919   HL 0.28/aPTT 150   HL subtherapeutic, aPTT supratherapeutic  does not appear to correlate    Goal of Therapy:  Heparin level 0.3-0.7 units/ml aPTT 66-102 seconds Monitor platelets by anticoagulation protocol: Yes   Plan:  10/18 1919   HL 0.28/aPTT 150 10/19 0242  HL 0.33/aPTT 175  HL now therapeutic, aPTT remains supratherapeutic not correlating but trending with increase in HL.  Continue heparin infusion at 1250 units  and recheck HL and aPTT in 6 hrs this time to confirm.  CBC daily  Renda Rolls, PharmD, [facility] 09/17/2022 5:41 AM

## 2022-09-17 NOTE — Progress Notes (Signed)
*  PRELIMINARY RESULTS* Echocardiogram 2D Echocardiogram has been performed.  Chelsea Ramsey 09/17/2022, 9:57 AM

## 2022-09-17 NOTE — Progress Notes (Signed)
[facility]    SUBJECTIVE: Patient resting comfortably still slightly lethargic and confused but improving slowly no fever no chills no sweats no headache reduce abdominal discomfort   Vitals:   09/17/22 1015 09/17/22 1027 09/17/22 1030 09/17/22 1200  BP: 105/80     Pulse: (!) 114 (!) 115 (!) 126   Resp: '14 14 14   '$ Temp:    98.2 F (36.8 C)  TempSrc:    Oral  SpO2: 96% 96% 97%   Weight:      Height:         Intake/Output Summary (Last 24 hours) at 09/17/2022 1252 Last data filed at 09/17/2022 2542 Gross per 24 hour  Intake 1833.46 ml  Output --  Net 1833.46 ml      PHYSICAL EXAM  General: Well developed, well nourished, in no acute distress HEENT:  Normocephalic and atramatic Neck:  No JVD.  Lungs: Clear bilaterally to auscultation and percussion. Heart: Irregularly irregular. Normal S1 and S2 without gallops or murmurs.  Abdomen: Bowel sounds are positive, abdomen soft and non-tender  Msk:  Back normal, normal gait. Normal strength and tone for age. Extremities: No clubbing, cyanosis or edema.   Neuro: Alert and oriented X 3. Psych:  Good affect, responds appropriately   LABS: Basic Metabolic Panel: Recent Labs    09/16/22 0325 09/17/22 0434  NA 139 138  K 3.5 3.2*  CL 99 98  CO2 30 28  GLUCOSE 80 78  BUN 50* 45*  CREATININE 3.52* 2.47*  CALCIUM 8.7* 8.3*   Liver Function Tests: Recent Labs    09/17/22 0042 09/17/22 0434  AST 55* 51*  ALT 35 32  ALKPHOS 75 72  BILITOT 9.8* 9.3*  PROT 7.0 6.6  ALBUMIN 2.8* 2.6*   Recent Labs    09/15/22 1259  LIPASE 46   CBC: Recent Labs    09/15/22 1259 09/16/22 0325 09/17/22 0434  WBC 6.7 6.1 5.5  NEUTROABS 4.9  --   --   HGB 9.9* 9.2* 8.8*  HCT 35.6* 32.8* 31.3*  MCV 78.4* 78.3* 77.9*  PLT 269 244 225   Cardiac Enzymes: No results for input(s): "CKTOTAL", "CKMB", "CKMBINDEX", "TROPONINI" in the last 72 hours. BNP: Invalid input(s): "POCBNP" D-Dimer: No results for input(s): "DDIMER"  in the last 72 hours. Hemoglobin A1C: Recent Labs    09/15/22 1259  HGBA1C 4.5*   Fasting Lipid Panel: No results for input(s): "CHOL", "HDL", "LDLCALC", "TRIG", "CHOLHDL", "LDLDIRECT" in the last 72 hours. Thyroid Function Tests: No results for input(s): "TSH", "T4TOTAL", "T3FREE", "THYROIDAB" in the last 72 hours.  Invalid input(s): "FREET3" Anemia Panel: Recent Labs    09/17/22 0042  FERRITIN 30  TIBC 416  IRON 31  RETICCTPCT 3.0    DG Chest 1 View  Result Date: 09/16/2022 CLINICAL DATA:  Worsening shortness of breath. 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 Cardiomegaly which has increased from 2021. Slight globular configuration of the heart, query pericardial effusion. Suspect small right pleural effusion. Chronic bilateral lung opacities that appears stable. No acute airspace disease or pneumothorax. On limited assessment, no acute osseous findings. IMPRESSION: 1. Increased cardiomegaly from 2021. Slight globular configuration of the heart, query pericardial effusion. 2. Suspect small right pleural effusion. 3. Stable chronic bilateral lung opacities. Electronically Signed   By: Keith Rake M.D.   On: 09/16/2022 17:21   MR ABDOMEN MRCP WO CONTRAST  Result  Date: 09/16/2022 CLINICAL DATA:  56 year old female with history of jaundice. Nausea. EXAM: MRI ABDOMEN WITHOUT CONTRAST  (INCLUDING MRCP) TECHNIQUE: Multiplanar multisequence MR imaging of the abdomen was performed. Heavily T2-weighted images of the biliary and pancreatic ducts were obtained, and three-dimensional MRCP images were rendered by post processing. COMPARISON:  No prior abdominal MRI or MRCP. Abdominal ultrasound 09/15/2022. FINDINGS: Comment: Portions of today's examination are substantially limited by patient respiratory motion. The study is also severely limited for detection and  characterization of visceral and/or vascular lesions by lack of IV gadolinium. Lower chest: Severe cardiomegaly. Massively distended inferior vena cava. Hepatobiliary: No definite suspicious cystic or solid hepatic lesions are confidently identified on today's motion limited noncontrast examination. No intra or extrahepatic biliary ductal dilatation noted on MRCP images. Common bile duct measures only 3 mm in the porta hepatis. No filling defect within the common bile duct to suggest choledocholithiasis. T1 hyperintense, T2 hypointense material lies dependently within the gallbladder. Gallbladder does not appear overly distended. Accurate assessment for gallbladder wall thickening is limited on today's examination secondary to motion, but there may be some very mild gallbladder wall thickening and edema. No overt pericholecystic fluid or surrounding inflammatory changes. Pancreas: No definite pancreatic mass or peripancreatic fluid collections or inflammatory changes noted on today's noncontrast examination. No pancreatic ductal dilatation. Spleen:  Unremarkable. Adrenals/Urinary Tract: Unenhanced appearance of the kidneys and bilateral adrenal glands is normal. No hydroureteronephrosis in the visualized portions of the abdomen. Stomach/Bowel: Visualized portions are unremarkable. Vascular/Lymphatic: No aneurysm identified in the visualized abdominal vasculature. No lymphadenopathy noted in the abdomen. Other: No significant volume of ascites noted in the visualized portions of the peritoneal cavity. Musculoskeletal: No aggressive appearing osseous lesions are noted in the visualized portions of the skeleton. IMPRESSION: 1. Biliary sludge lying dependently in the gallbladder. Gallbladder wall may be mildly edematous, but is poorly evaluated on today's motion limited examination. If there is any clinical concern for acute cholecystitis, further evaluation with right upper quadrant abdominal ultrasound or nuclear  medicine hepatobiliary scan should be considered at this time. 2. No biliary tract dilatation to suggest obstruction. No evidence of choledocholithiasis. 3. Severe cardiomegaly. Electronically Signed   By: Vinnie Langton M.D.   On: 09/16/2022 05:28   US ABDOMEN LIMITED RUQ (LIVER/GB)  Result Date: 09/15/2022 CLINICAL DATA:  Elevated bilirubin EXAM: ULTRASOUND ABDOMEN LIMITED RIGHT UPPER QUADRANT COMPARISON:  CT abdomen and pelvis 12/01/2018 FINDINGS: Gallbladder: Sludge is present within the gallbladder. Small amount of pericholecystic fluid. The gallbladder wall is mildly thickened measuring 5 mm. Sonographic Murphy's sign was positive. Common bile duct: Diameter: 3 mm Liver: No focal lesion identified. Increased echogenicity. Portal vein is patent on Doppler imaging with bidirectional flow. Other: None. IMPRESSION: 1. Findings compatible with acute cholecystitis. 2. 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 Electronically Signed   By: Placido Sou M.D.   On: 09/15/2022 23:07     Echo pending  TELEMETRY: Relation rate of: 90 nonspecific ST-T wave changes  ASSESSMENT AND PLAN:  Principal Problem:   Abdominal pain Active Problems:   A-fib (HCC)   Chronic diastolic heart failure (HCC)   HTN (hypertension)   Tobacco dependence   History of cocaine use   Iron deficiency anemia   AKI (acute kidney injury) (HCC)   Elevated LFTs    Plan Abdominal pain unclear etiology agree with GI and surgical work-up and evaluation Elevated LFTs of unclear etiology patient may MRCP for further assessment Acute on chronic  renal insufficiency recommend gentle hydration avoid nephrotoxic drugs Acute encephalopathy of unclear etiology patient slightly improving continue mild hydration Recommend short-term anticoagulation with heparin for atrial fibrillation consider transitioning to Eliquis later Awaiting echocardiogram for left ventricular  function wall motion Advised the patient to refrain from tobacco abuse   Yolonda Kida, MD 09/17/2022 12:52 PM

## 2022-09-17 NOTE — ED Notes (Signed)
Pt using bedside toilet when she dislodged her IV. This nurse assisted pt back into bed. All leads, iv tubing had to be changed and cleaned due to blood all over them. Floor mopped by EVS.

## 2022-09-17 NOTE — Evaluation (Signed)
Physical Therapy Evaluation Patient Details Name: Chelsea Ramsey MRN: [id number] DOB: 08-Feb-1966 Today's Date: 09/17/2022  History of Present Illness  56 y.o. female  Seen by pcp today for not feeling well and abd pain for about a month per patient.   Her pcp called and advised she come to ed for abnormal labs.  Clinical Impression  Pt was able to move relatively well with PT exam but did have elevated HR t/o the session. She has been maintaining HR of <110 most of her admittance, fluctuated between 110 and 150 t/o most of PT session with only moderate proportionality to the level of activity.  Cardiovascular status precludes prolonged ambulation today but she did better than expected including small bout of ambulation, at least 7 sit to stand efforts from varying heights/ADs and typically without assist.  She reports that 2 falls in the last 6 months (on just before admittance, the other ~1 month ago in the yard).  She typically does not need a RW, and though she was able to do some static standing w/o UEs with ambulation she required UEs.   Pt is not at her baseline and will require further PT, expect we should be able to do more prolonged bout of ambulation when HR is more controlled.     Recommendations for follow up therapy are one component of a multi-disciplinary discharge planning process, led by the attending physician.  Recommendations may be updated based on patient status, additional functional criteria and insurance authorization.  Follow Up Recommendations Home health PT      Assistance Recommended at Discharge Frequent or constant Supervision/Assistance  Patient can return home with the following  A little help with walking and/or transfers;A lot of help with bathing/dressing/bathroom;Assistance with cooking/housework;Assist for transportation;Help with stairs or ramp for entrance    Equipment Recommendations None recommended by PT  Recommendations for Other  Services       Functional Status Assessment Patient has had a recent decline in their functional status and demonstrates the ability to make significant improvements in function in a reasonable and predictable amount of time.     Precautions / Restrictions Precautions Precautions: Fall Restrictions Weight Bearing Restrictions: No      Mobility  Bed Mobility Overal bed mobility: Needs Assistance Bed Mobility: Supine to Sit, Sit to Supine     Supine to sit: Min assist Sit to supine: Mod assist   General bed mobility comments: Pt showed good effort and needed only light assist with transition to sitting, with heavy UE use on rails.  Additionally she showed good effort getting back into bed but was unable to lift L LE up into bed needing assist to get fully back into bed, able to scoot in bed on her own once fully in bed.    Transfers Overall transfer level: Needs assistance Equipment used: Rolling walker (2 wheels), Straight cane Transfers: Sit to/from Stand Sit to Stand: Min guard, Min assist           General transfer comment: First 2 standing attempts with walker required min assist (once raised ~2" and once from standard height bed).  Later after some standing activities we did 5x STS with SPC and supervision only; gradually decreasing height - with final attempt from standard height bed    Ambulation/Gait Ambulation/Gait assistance: Min guard Gait Distance (Feet): 5 Feet Assistive device: Rolling walker (2 wheels), 1 person hand held assist         General Gait Details: Pt's HR remains in  the 120s at rest and up to 130s, 140s and even to 150 with activity so prolonged ambulation deferred.  However we did do multiple side steps along EOB with walker and b/l HHA, additionally she was able to ambulate a few steps forward and back with FWW and showed reasonable confidence and no overt safety issues.  Pt did fatigue with the effort but was eager to keep going.  Stairs             Wheelchair Mobility    Modified Rankin (Stroke Patients Only)       Balance Overall balance assessment: Needs assistance Sitting-balance support: No upper extremity supported, Feet supported Sitting balance-Leahy Scale: Good Sitting balance - Comments: Pt able to maintain sitting balance with relative ease     Standing balance-Leahy Scale: Fair Standing balance comment: Pt reports that just a month ago was able to do some ambulation w/o AD, today clearly reliant on the walker for dynamic tasks, but did maintain static standing with eyes open and eyes closed for >10 seconds w/o UEs.                             Pertinent Vitals/Pain      Home Living Family/patient expects to be discharged to:: Private residence Living Arrangements: Spouse/significant other Available Help at Discharge: Available PRN/intermittently (husband OOH for work during the day) Type of Home: House Home Access: Stairs to enter Entrance Stairs-Rails: None Entrance Stairs-Number of Steps: 2     Home Equipment: Conservation officer, nature (2 wheels);Cane - single point      Prior Function Prior Level of Function : Needs assist             Mobility Comments: Pt reports that until the last few weeks she was either using no AD or just a SPC, up ad lib cooking and doing light house work       Journalist, newspaper        Extremity/Trunk Assessment   Upper Extremity Assessment Upper Extremity Assessment: Generalized weakness;Overall [facility] for tasks assessed    Lower Extremity Assessment Lower Extremity Assessment: Generalized weakness;Overall WFL for tasks assessed       Communication   Communication: No difficulties  Cognition Arousal/Alertness: Awake/alert Behavior During Therapy: WFL for tasks assessed/performed Overall Cognitive Status:  (pt's mother reports she seems to have mild confusion but pt aware and appropriate t/o the session)                                           General Comments      Exercises     Assessment/Plan    PT Assessment Patient needs continued PT services  PT Problem List Decreased strength;Decreased range of motion;Decreased activity tolerance;Decreased balance;Decreased mobility;Decreased safety awareness;Decreased knowledge of use of DME;Pain;Cardiopulmonary status limiting activity       PT Treatment Interventions DME instruction;Gait training;Functional mobility training;Therapeutic activities;Therapeutic exercise;Balance training;Patient/family education    PT Goals (Current goals can be found in the Care Plan section)  Acute Rehab PT Goals Patient Stated Goal: go home PT Goal Formulation: With patient Time For Goal Achievement: 09/30/22 Potential to Achieve Goals: Fair    Frequency Min 2X/week     Co-evaluation               AM-PAC PT "6 Clicks" Mobility  Outcome Measure Help needed  turning from your back to your side while in a flat bed without using bedrails?: A Little Help needed moving from lying on your back to sitting on the side of a flat bed without using bedrails?: A Little Help needed moving to and from a bed to a chair (including a wheelchair)?: A Little Help needed standing up from a chair using your arms (e.g., wheelchair or bedside chair)?: A Little Help needed to walk in hospital room?: A Lot Help needed climbing 3-5 steps with a railing? : A Lot 6 Click Score: 16    End of Session Equipment Utilized During Treatment: Gait belt;Oxygen (2L) Activity Tolerance: Patient tolerated treatment well Patient left: with bed alarm set;with call bell/phone within reach;with family/visitor present Nurse Communication: Mobility status PT Visit Diagnosis: Muscle weakness (generalized) (M62.81);Difficulty in walking, not elsewhere classified (R26.2);Unsteadiness on feet (R26.81)    Time: 5462-7035 PT Time Calculation (min) (ACUTE ONLY): 34 min   Charges:   PT Evaluation $PT Eval Moderate  Complexity: 1 Mod PT Treatments $Therapeutic Activity: 8-22 mins        Kreg Shropshire, DPT 09/17/2022, 1:51 PM

## 2022-09-18 DIAGNOSIS — N179 Acute kidney failure, unspecified: Secondary | ICD-10-CM | POA: Diagnosis not present

## 2022-09-18 DIAGNOSIS — I480 Paroxysmal atrial fibrillation: Secondary | ICD-10-CM | POA: Diagnosis not present

## 2022-09-18 DIAGNOSIS — R1010 Upper abdominal pain, unspecified: Secondary | ICD-10-CM | POA: Diagnosis not present

## 2022-09-18 DIAGNOSIS — I5032 Chronic diastolic (congestive) heart failure: Secondary | ICD-10-CM | POA: Diagnosis not present

## 2022-09-18 LAB — HEPATITIS PANEL, ACUTE
HCV Ab: NONREACTIVE
Hep A IgM: NONREACTIVE
Hep B C IgM: NONREACTIVE
Hepatitis B Surface Ag: NONREACTIVE

## 2022-09-18 LAB — CBC
HCT: 30.9 % — ABNORMAL LOW (ref 36.0–46.0)
Hemoglobin: 8.9 g/dL — ABNORMAL LOW (ref 12.0–15.0)
MCH: 22.3 pg — ABNORMAL LOW (ref 26.0–34.0)
MCHC: 28.8 g/dL — ABNORMAL LOW (ref 30.0–36.0)
MCV: 77.4 fL — ABNORMAL LOW (ref 80.0–100.0)
Platelets: 190 10*3/uL (ref 150–400)
RBC: 3.99 MIL/uL (ref 3.87–5.11)
RDW: 23.8 % — ABNORMAL HIGH (ref 11.5–15.5)
WBC: 5.3 10*3/uL (ref 4.0–10.5)
nRBC: 0.4 % — ABNORMAL HIGH (ref 0.0–0.2)

## 2022-09-18 LAB — COMPREHENSIVE METABOLIC PANEL
ALT: 34 U/L (ref 0–44)
AST: 53 U/L — ABNORMAL HIGH (ref 15–41)
Albumin: 2.7 g/dL — ABNORMAL LOW (ref 3.5–5.0)
Alkaline Phosphatase: 69 U/L (ref 38–126)
Anion gap: 12 (ref 5–15)
BUN: 39 mg/dL — ABNORMAL HIGH (ref 6–20)
CO2: 26 mmol/L (ref 22–32)
Calcium: 8.3 mg/dL — ABNORMAL LOW (ref 8.9–10.3)
Chloride: 102 mmol/L (ref 98–111)
Creatinine, Ser: 1.99 mg/dL — ABNORMAL HIGH (ref 0.44–1.00)
GFR, Estimated: 29 mL/min — ABNORMAL LOW (ref >60)
Glucose, Bld: 87 mg/dL (ref 70–99)
Potassium: 2.9 mmol/L — ABNORMAL LOW (ref 3.5–5.1)
Sodium: 140 mmol/L (ref 135–145)
Total Bilirubin: 9.4 mg/dL — ABNORMAL HIGH (ref 0.3–1.2)
Total Protein: 6.6 g/dL (ref 6.5–8.1)

## 2022-09-18 LAB — APTT
aPTT: 72 seconds — ABNORMAL HIGH (ref 24–36)
aPTT: 56 seconds — ABNORMAL HIGH (ref 24–36)
aPTT: 128 seconds — ABNORMAL HIGH (ref 24–36)

## 2022-09-18 LAB — MAGNESIUM: Magnesium: 1.6 mg/dL — ABNORMAL LOW (ref 1.7–2.4)

## 2022-09-18 LAB — HEPARIN LEVEL (UNFRACTIONATED)
Heparin Unfractionated: 0.1 IU/mL — ABNORMAL LOW (ref 0.30–0.70)
Heparin Unfractionated: 0.23 IU/mL — ABNORMAL LOW (ref 0.30–0.70)

## 2022-09-18 LAB — BRAIN NATRIURETIC PEPTIDE: B Natriuretic Peptide: 558.5 pg/mL — ABNORMAL HIGH (ref 0.0–100.0)

## 2022-09-18 LAB — FIBRINOGEN: Fibrinogen: 212 mg/dL (ref 210–475)

## 2022-09-18 MED ORDER — SODIUM CHLORIDE 0.9% FLUSH: 3.0000 mL | INTRAVENOUS | Status: DC | PRN

## 2022-09-18 MED ORDER — POTASSIUM CHLORIDE CRYS ER 20 MEQ PO TBCR
40.0000 meq | EXTENDED_RELEASE_TABLET | ORAL | Status: AC
  Administered 2022-09-18 (×2): 40 meq via ORAL
  Filled 2022-09-18 (×2): qty 2

## 2022-09-18 MED ORDER — PROCHLORPERAZINE EDISYLATE 10 MG/2ML IJ SOLN
10.0000 mg | Freq: Four times a day (QID) | INTRAMUSCULAR | Status: DC | PRN
  Administered 2022-09-18 – 2022-09-30 (×5): 10 mg via INTRAVENOUS
  Filled 2022-09-18 (×6): qty 2

## 2022-09-18 MED ORDER — MIDODRINE HCL 5 MG PO TABS
5.0000 mg | ORAL_TABLET | Freq: Three times a day (TID) | ORAL | Status: DC
  Administered 2022-09-19 (×2): 5 mg via ORAL
  Filled 2022-09-18 (×2): qty 1

## 2022-09-18 MED ORDER — HEPARIN BOLUS VIA INFUSION
1300.0000 [IU] | Freq: Once | INTRAVENOUS | Status: AC
  Administered 2022-09-18: 1300 [IU] via INTRAVENOUS
  Filled 2022-09-18: qty 1300

## 2022-09-18 MED ORDER — SOTALOL HCL 80 MG PO TABS
80.0000 mg | ORAL_TABLET | ORAL | Status: DC
  Administered 2022-09-18 – 2022-09-20 (×2): 80 mg via ORAL
  Filled 2022-09-18 (×4): qty 1

## 2022-09-18 MED ORDER — IPRATROPIUM-ALBUTEROL 0.5-2.5 (3) MG/3ML IN SOLN
3.0000 mL | Freq: Four times a day (QID) | RESPIRATORY_TRACT | Status: DC
  Administered 2022-09-18 – 2022-09-19 (×5): 3 mL via RESPIRATORY_TRACT
  Filled 2022-09-18 (×5): qty 3

## 2022-09-18 MED ORDER — TRAZODONE HCL 50 MG PO TABS
25.0000 mg | ORAL_TABLET | Freq: Every evening | ORAL | Status: DC | PRN
  Administered 2022-09-18 – 2022-09-29 (×3): 25 mg via ORAL
  Filled 2022-09-18 (×4): qty 1

## 2022-09-18 MED ORDER — SODIUM CHLORIDE 0.9% FLUSH
3.0000 mL | Freq: Two times a day (BID) | INTRAVENOUS | Status: DC
  Administered 2022-09-18 – 2022-09-30 (×23): 3 mL via INTRAVENOUS

## 2022-09-18 MED ORDER — DILTIAZEM HCL 30 MG PO TABS
30.0000 mg | ORAL_TABLET | Freq: Four times a day (QID) | ORAL | Status: DC
  Administered 2022-09-20 (×3): 30 mg via ORAL
  Filled 2022-09-18 (×6): qty 1

## 2022-09-18 MED ORDER — SODIUM CHLORIDE 0.9 % IV SOLN: 250.0000 mL | INTRAVENOUS | Status: DC | PRN

## 2022-09-18 NOTE — Progress Notes (Signed)
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 Ramsey consulted due to acute on chronic combined systolic and diastolic CHF with fluid volume overload, in addition to atrial fibrillation with RVR.  SUBJECTIVE: The patient continues to not be feeling well on today. She c/o BLE edema with pain, dyspnea and heart racing; in addition to continued abdominal pain and worsening in her chronic back pain. She is requesting pain medication. She denies having any chest pain at this time.   OBJECTIVE:   Vitals:   09/18/22 0930 09/18/22 1047 09/18/22 1409 09/18/22 1615  BP: 101/88 107/75  100/71  Pulse: (!) 126 (!) 126  (!) 113  Resp: 18 (!) 22  16  Temp:  97.7 F (36.5 C)  97.7 F (36.5 C)  TempSrc:      SpO2: 100% 100% 100% 100%  Weight:      Height:         Intake/Output Summary (Last 24 hours) at 09/18/2022 1724 Last data filed at 09/18/2022 0818 Gross per 24 hour  Intake 1730.05 ml  Output 800 ml  Net 930.05 ml      PHYSICAL EXAM  General: Well developed, well nourished, in no acute distress, appears drowsy  HEENT:  Normocephalic and atraumatic.  PERRL Neck:  No JVD.  No bruit. Lungs: Diminished bilaterally to auscultation with expiratory wheezes noted in the right upper lobe.  Chest expansion symmetrical.  No rales, rhonchi or crackles. Heart: Regular heart rate and rhythm. Normal S1 and S2 without gallops.  Grade 1/6 cardiac murmur. Abdomen: Bowel sounds are positive, abdomen soft and tender to touch.   Obesity complicates assessment of distention  Msk:  Normal strength and tone for age. Extremities: +1 pitting BLE edema. No clubbing or cyanosis. Neuro: Alert and oriented X 3. Psych:  flat affect, responds appropriately   LABS: Basic Metabolic Panel: Recent Labs    09/17/22 0434 09/18/22 0749  NA 138 140  K 3.2* 2.9*  CL 98 102  CO2 28 26  GLUCOSE 78 87  BUN 45* 39*  CREATININE 2.47* 1.99*  CALCIUM 8.3* 8.3*  MG  --  1.6*   Liver Function Tests: Recent Labs    09/17/22 0434 09/17/22 1807 09/18/22 0749  AST 51*  --  53*  ALT 32  --  34  ALKPHOS 72  --  69  BILITOT 9.3* 9.3* 9.4*  PROT 6.6  --  6.6  ALBUMIN 2.6*  --  2.7*   No results for input(s): "LIPASE", "AMYLASE" in the last 72 hours. CBC: Recent Labs    09/17/22 0434 09/18/22 0617  WBC 5.4  5.5 5.3  NEUTROABS 3.7  --   HGB 8.8*  8.8* 8.9*  HCT 32.1*  31.3* 30.9*  MCV 79.3*  77.9* 77.4*  PLT 228  225 190   Cardiac Enzymes: No results for input(s): "CKTOTAL", "CKMB", "CKMBINDEX", "TROPONINI" in the last 72 hours. BNP: Invalid input(s): "POCBNP" D-Dimer: No results for input(s): "DDIMER" in the last 72 hours. Hemoglobin A1C: No results for input(s): "HGBA1C" in the last 72 hours. Fasting Lipid Panel: No results for input(s): "CHOL", "HDL", "LDLCALC", "TRIG", "CHOLHDL", "  LDLDIRECT" in the last 72 hours. Thyroid Function Tests: No results for input(s): "TSH", "T4TOTAL", "T3FREE", "THYROIDAB" in the last 72 hours.  Invalid input(s): "FREET3" Anemia Panel: Recent Labs    09/17/22 0042  FERRITIN 30  TIBC 416  IRON 31  RETICCTPCT 3.0    ECHOCARDIOGRAM COMPLETE  Result Date: 09/17/2022    ECHOCARDIOGRAM REPORT   Patient Name:   Chelsea Ramsey Date of Exam: 09/17/2022 Medical Rec #:  [id number]                 Height:       64.0 in Accession #:    [id number]                Weight:       265.0 lb Date of Birth:  [birth year]                 BSA:          2.205 m Patient Age:    74  years                  BP:           93/79 mmHg Patient Gender: F                         HR:           110 bpm. Exam Location:  [facility] Procedure: 2D Echo, Color Doppler and Cardiac Doppler Indications:     I50.31 CHF-Acute Diastolic  History:         Patient has prior history of Echocardiogram examinations, most                  recent 11/05/2018. CHF, CAD, COPD; Risk Factors:Hypertension,                  Diabetes and Sleep Apnea.  Sonographer:     Charmayne Sheer Referring Phys:  [id number] [place] TANG Diagnosing Phys: Yolonda Kida MD  Sonographer Comments: Suboptimal apical window and no subcostal window. Image acquisition challenging due to patient body habitus and Image acquisition challenging due to COPD. IMPRESSIONS  1. Left ventricular ejection fraction, by estimation, is 20 to 25%. The left ventricle has severely decreased function. The left ventricle demonstrates global hypokinesis. Left ventricular diastolic parameters are consistent with Grade III diastolic dysfunction (restrictive). There is the interventricular septum is flattened in systole and diastole, consistent with right ventricular pressure and volume overload.  2. Right ventricular systolic function is moderately reduced. The right ventricular size is moderately enlarged. Mildly increased right ventricular wall thickness.  3. Left atrial size was moderately dilated.  4. Right atrial size was severely dilated.  5. The mitral valve is abnormal. Mild to moderate mitral valve regurgitation.  6. Tricuspid valve regurgitation is severe.  7. The aortic valve is calcified. Aortic valve regurgitation is mild to moderate. Aortic valve sclerosis/calcification is present, without any evidence of aortic stenosis. Conclusion(s)/Recommendation(s): Poor windows for evaluation of left ventricular function by transthoracic echocardiography. Would recommend an alternative means of evaluation. FINDINGS  Left Ventricle: Left ventricular ejection fraction, by  estimation, is 20 to 25%. The left ventricle has severely decreased function. The left ventricle demonstrates global hypokinesis. The left ventricular internal cavity size was normal in size. There is borderline concentric left ventricular hypertrophy. The interventricular septum is flattened in systole and diastole, consistent with right ventricular pressure and volume overload. Left ventricular diastolic parameters are  consistent with Grade III diastolic dysfunction (restrictive). Right Ventricle: The right ventricular size is moderately enlarged. Mildly increased right ventricular wall thickness. Right ventricular systolic function is moderately reduced. Left Atrium: Left atrial size was moderately dilated. Right Atrium: Right atrial size was severely dilated. Pericardium: There is no evidence of pericardial effusion. Mitral Valve: The mitral valve is abnormal. Mild to moderate mitral valve regurgitation. Tricuspid Valve: The tricuspid valve is grossly normal. Tricuspid valve regurgitation is severe. Aortic Valve: The aortic valve is calcified. Aortic valve regurgitation is mild to moderate. Aortic regurgitation PHT measures 233 msec. Aortic valve sclerosis/calcification is present, without any evidence of aortic stenosis. Aortic valve mean gradient measures 6.0 mmHg. Aortic valve peak gradient measures 11.3 mmHg. Aortic valve area, by VTI measures 1.14 cm. Pulmonic Valve: The pulmonic valve was normal in structure. Pulmonic valve regurgitation is mild to moderate. Aorta: The ascending aorta was not well visualized. IAS/Shunts: No atrial level shunt detected by color flow Doppler.  LEFT VENTRICLE PLAX 2D LVIDd:         3.50 cm LVIDs:         3.20 cm LV PW:         1.20 cm LV IVS:        0.90 cm LVOT diam:     1.80 cm LV SV:         25 LV SV Index:   12 LVOT Area:     2.54 cm  RIGHT VENTRICLE RV Basal diam:  5.30 cm RV Mid diam:    5.00 cm RV S prime:     7.62 cm/s LEFT ATRIUM         Index       RIGHT ATRIUM            Index LA diam:    5.50 cm 2.49 cm/m  RA Area:     28.50 cm                                 RA Volume:   104.00 ml 47.17 ml/m  AORTIC VALVE                     PULMONIC VALVE AV Area (Vmax):    0.87 cm      PV Vmax:          0.98 m/s AV Area (Vmean):   0.90 cm      PV Vmean:         69.200 cm/s AV Area (VTI):     1.14 cm      PV VTI:           0.128 m AV Vmax:           168.00 cm/s   PV Peak grad:     3.9 mmHg AV Vmean:          116.000 cm/s  PV Mean grad:     2.5 mmHg AV VTI:            0.223 m       PR End Diast Vel: 10.24 msec AV Peak Grad:      11.3 mmHg AV Mean Grad:      6.0 mmHg LVOT Vmax:         57.60 cm/s LVOT Vmean:        40.900 cm/s LVOT VTI:          0.100 m LVOT/AV VTI  ratio: 0.45 AI PHT:            233 msec  AORTA Ao Root diam: 3.10 cm MITRAL VALVE               TRICUSPID VALVE MV Area (PHT): 5.95 cm    TR Peak grad:   55.7 mmHg MV Decel Time: 128 msec    TR Vmax:        373.00 cm/s MV E velocity: 87.60 cm/s                            SHUNTS                            Systemic VTI:  0.10 m                            Systemic Diam: 1.80 cm Dwayne D Callwood MD Electronically signed by Yolonda Kida MD Signature Date/Time: 09/17/2022/4:54:34 PM    Final      TELEMETRY: Atrial fibrillation with RVR with a heart rate of 114-121 bpm at this time   ASSESSMENT AND PLAN:  Principal Problem:   Abdominal pain Active Problems:   A-fib (HCC)   Chronic diastolic heart failure (HCC)   HTN (hypertension)   Tobacco dependence   History of cocaine use   Iron deficiency anemia   AKI (acute kidney injury) (HCC)   Elevated LFTs    #Acute on chronic HFrEF, decompensated  #Rheumatic mitral valve disease s/p MVR # tobacco use  #AKI with improvements  # HTN, with currently soft bp's  The patient continues to be fluid volume overloaded.  The echocardiogram revealed severe LV systolic dysfunction with estimated EF of 20-25%, global hypokinesis, grade 3 diastolic dysfunction with  findings consistent with right ventricular pressure and volume overload, moderately reduced RV function, biatrial dilatation, mild to moderate mitral valve regurgitation and severe tricuspid regurgitation.  Her AKI is likely secondary to acute illness and IV diuresis; however, creatinine has been improving since admission with initial level at  3.54 and today was 1.99.   -Continue Lasix IV 40 mg by mouth twice daily.  - beta blocker as below.   -Strict I's and O's, daily weights, dietary management per GI.   -Recommend nicotine patch due to at home tobacco use.   #Atrial fibrillation with RVR, poorly controlled  The patient is s/p previous ablation. HR poorly controlled at this time. Unable to titrate beta blocker up due to borderline hypertension. Pharmacy consulted and we will start midodrine to increase blood pressure to allow for medications to be added for atrial fibrillation management.   - start sotalol 80 mg orally twice daily.  - start diltiazem '30mg'$  orally every 6 hours.   - start midodrine '5mg'$  orally three times daily to make room in blood pressure.   -Continuous telemetry monitoring.  -Repeat ECG tomorrow morning.  -Continue heparin drip for anticoagulation. Transition back to at home dosage of DOAC as surgery/GI allows.   -Continuous telemetry monitoring.  #Right-sided abdominal pain, transaminitis, hyperbilirubinemia, s/p MRCP  -Agree with current management plan.  - GI following.    The patient's history, physical exam findings and plan of care were all discussed with Dr. Karma Greaser D. Callwood, whom also evaluated the patient, and all decision making was made in collaboration with him. Halma, ACNPC-AG  09/18/2022 5:24 PM

## 2022-09-18 NOTE — Evaluation (Signed)
Occupational Therapy Evaluation Patient Details Name: Chelsea Ramsey MRN: [id number] DOB: 02/09/1966 Today's Date: 09/18/2022   History of Present Illness 56 y.o. female  Seen by pcp today for not feeling well and abd pain for about a month per patient.   Her pcp called and advised she come to ed for abnormal labs.   Clinical Impression   Patient presenting with decreased Ind in self care,balance, functional mobility/transfers, endurance, and safety awareness. Patient reports being tired during session but is awake and agreeable to OT evaluation. Pt reports living at home with mother independently at baseline. However, pt's mother present during PT evaluation yesterday and reports pt living with husband. Pt answers orientation questions correctly but appears confused. Patient currently functioning at max A for bed mobility and refuses all other tasks. HR remains elevated to 140's with bed mobility. Patient will benefit from acute OT to increase overall independence in the areas of ADLs, functional mobility, and safety awareness in order to safely discharge to next venue of care.      Recommendations for follow up therapy are one component of a multi-disciplinary discharge planning process, led by the attending physician.  Recommendations may be updated based on patient status, additional functional criteria and insurance authorization.   Follow Up Recommendations  Skilled nursing-short term rehab (<3 hours/day)    Assistance Recommended at Discharge Intermittent Supervision/Assistance  Patient can return home with the following A lot of help with walking and/or transfers;A lot of help with bathing/dressing/bathroom;Help with stairs or ramp for entrance;Assist for transportation    Functional Status Assessment  Patient has had a recent decline in their functional status and demonstrates the ability to make significant improvements in function in a reasonable and predictable amount of  time.  Equipment Recommendations  Other (comment) (defer to next venue of care)       Precautions / Restrictions Precautions Precautions: Fall      Mobility Bed Mobility Overal bed mobility: Needs Assistance       Supine to sit: Max assist Sit to supine: Max assist        Transfers                   General transfer comment: Pt refusing to attempt          ADL either performed or assessed with clinical judgement   ADL Overall ADL's : Needs assistance/impaired                                       General ADL Comments: Pt need max A to move LBs towards EOB and was unwilling to attempt any other activity. She had difficulty sucking drink from straw.     Vision Patient Visual Report: No change from baseline              Pertinent Vitals/Pain Pain Assessment Pain Assessment: Faces Faces Pain Scale: No hurt        Extremity/Trunk Assessment Upper Extremity Assessment Upper Extremity Assessment: Generalized weakness   Lower Extremity Assessment Lower Extremity Assessment: Generalized weakness       Communication Communication Communication: No difficulties   Cognition Arousal/Alertness: Lethargic Behavior During Therapy: Flat affect Overall Cognitive Status: No family/caregiver present to determine baseline cognitive functioning  General Comments: Pt is oriented but very fatigued throughout session. RN reports more assistance needed over night and this morning than yesterday.                Home Living Family/patient expects to be discharged to:: Private residence Living Arrangements: Spouse/significant other Available Help at Discharge: Available PRN/intermittently (husband works during the day) Type of Home: House Home Access: Stairs to enter Technical brewer of Steps: 2 Entrance Stairs-Rails: None Home Layout: One level     Bathroom Shower/Tub: Tub/shower  unit         [place]: Conservation officer, nature (2 wheels);Cane - single point          Prior Functioning/Environment Prior Level of Function : Needs assist             Mobility Comments: Pt reports that until the last few weeks she was either using no AD or just a SPC, up ad lib cooking and doing light house work ADLs Comments: Pt reports being mod I with self care tasks and some IADLs. Family assist with IADLs as well.        OT Problem List: Decreased strength;Decreased activity tolerance;Impaired balance (sitting and/or standing);Decreased safety awareness      OT Treatment/Interventions: Self-care/ADL training;Therapeutic exercise;Energy conservation;DME and/or AE instruction;Therapeutic activities;Balance training;Patient/family education    OT Goals(Current goals can be found in the care plan section) Acute Rehab OT Goals Patient Stated Goal: to get some rest OT Goal Formulation: With patient Time For Goal Achievement: 10/02/22 Potential to Achieve Goals: Fair ADL Goals Pt Will Perform Grooming: with supervision;standing Pt Will Transfer to Toilet: with supervision;ambulating Pt Will Perform Toileting - Clothing Manipulation and hygiene: with supervision;sit to/from stand Pt/caregiver will Perform Home Exercise Program: Increased strength;Both right and left upper extremity;With written HEP provided;With Supervision;With theraband  OT Frequency: Min 2X/week       AM-PAC OT "6 Clicks" Daily Activity     Outcome Measure Help from another person eating meals?: A Little Help from another person taking care of personal grooming?: A Little Help from another person toileting, which includes using toliet, bedpan, or urinal?: A Lot Help from another person bathing (including washing, rinsing, drying)?: A Lot Help from another person to put on and taking off regular upper body clothing?: A Little Help from another person to put on and taking off regular lower body clothing?:  A Lot 6 Click Score: 15   End of Session Equipment Utilized During Treatment: Oxygen Nurse Communication: Mobility status  Activity Tolerance: Patient limited by fatigue Patient left: in bed;with call bell/phone within reach;with bed alarm set  OT Visit Diagnosis: Unsteadiness on feet (R26.81);Repeated falls (R29.6);Muscle weakness (generalized) (M62.81)                Time: 3007-6226 OT Time Calculation (min): 14 min Charges:  OT General Charges $OT Visit: 1 Visit OT Evaluation $OT Eval Moderate Complexity: [address] [place], OTR/L , CBIS ascom [phone]  09/18/22, 1:05 PM

## 2022-09-18 NOTE — Progress Notes (Addendum)
[facility]                                                                              Chelsea Ramsey, is a 56 y.o. female, DOB - [birth year], HOZ:[id number] Admit date - 09/15/2022    Outpatient Primary MD for the patient is Emelia Loron, NP  LOS - 2  days  Chief Complaint  Patient presents with   Renal Problem       Brief summary   Patient is 56 year old female with DM, pulm HTN, COPD, CHF, GERD, OSA was seen by PCP on day of admission for not feeling well and abd pain for about a month. Patient complained of pain in RUQ and right flank with nausea.   abnormal outpatient labs showing AKI.   In ED, her labwork confirmed AKI with Cr 3.54, total bilirubin of 9.6.  AST mildly elevated to 57, ALT 35, Alk Phos 80.  U/S was done which showed gallbladder wall thickening to 5 mm with pericholecystic fluid, suspicious for acute cholecystitis.  CBD per U/S was 3 mm.   Assessment & Plan    Principal Problem:   Right sided abdominal pain, transaminitis, hyperbilirubinemia - unclear etiology, suspected cholecystitis.  Surgery was consulted, recommended MRCP - MRCP showed biliary sludge, GB mild edematous. No biliary tract dilation to suggest obstruction.  No choledocholithiasis. 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 overload  -2D echo showed EF of 20 to 25% with grade 3 diastolic CHF, right ventricular pressure and volume overload (previous 2D echo in 10/2018 had shown EF of 50 to 55%) -Continue Lasix 40 mg IV every 12 hours, -Strict I's and O's and daily weights -Requested cardiology evaluation  Atrial fibrillation  with RVR -rate not well controlled, up to 120s-130's - on metoprolol 12.5 mg twice daily however not able to titrate up due to borderline BP -Seen by cardiology, Dr Clayborn Bigness on 10/18, requested cardiology to follow-up again     AKI (acute kidney injury) ([place]) -Baseline creatinine 0.78 on 01/02/2022, presented with creatinine of 3.54 on 09/15/2022 -Creatinine improving, 1.9     Iron deficiency anemia -Hemoglobin 9.8 on 11/02/2020, on admission 9.9 -Patient had endoscopy 06/29/2022 which had shown erythematous mucosa in the antrum, normal duodenal bulb and second portion of duodenum.  Colonoscopy had shown melanosis coli but otherwise normal.  -Hematology consulted,  low suspicion for valvular cause of hemolytic anemia, low suspicion for TTP/HUS -Elevated bilirubin both direct and indirect likely congestive hepatopathy.     HTN (hypertension) -Borderline BP, currently on Lasix and beta-blocker   Hypokalemia -Replaced    Tobacco dependence -Counseled on smoking cessation  Morbid obesity Estimated body mass index is 45.49 kg/m as calculated from the following:   Height as of this encounter: 5' 4"  (1.626 m).   Weight as of this encounter: 120.2 kg.  Code Status: Full DVT  Prophylaxis:    IV heparin drip   Level of Care: Level of care: Progressive Family Communication: Updated patient  Disposition Plan:      Remains inpatient appropriate: Work-up in progress   Procedures:  MRCP 2D echo  Consultants:   General surgery GI Cardiology Hematology  Antimicrobials: None    Medications  allopurinol  100 mg Oral Daily   docusate sodium  100 mg Oral BID   furosemide  40 mg Intravenous Q12H   ipratropium-albuterol  3 mL Nebulization Q6H   metoprolol tartrate  12.5 mg Oral BID   pantoprazole (PROTONIX) IV  40 mg Intravenous Q12H   potassium chloride  40 mEq Oral Q4H   sodium chloride flush  3 mL Intravenous Q12H   sodium chloride flush  3 mL Intravenous Q12H       Subjective:   Chelsea Ramsey was seen and examined today.  Does not feel too good, still short of breath, heart rate not well controlled.  No acute nausea or vomiting.  No fevers.   Objective:   Vitals:   09/18/22 0745 09/18/22 0848 09/18/22 0930 09/18/22 1047  BP:  104/78 101/88 107/75  Pulse: (!) 125 (!) 128 (!) 126 (!) 126  Resp: 17 17 18  (!) 22  Temp:  97.7 F (36.5 C)  97.7 F (36.5 C)  TempSrc:  Oral    SpO2: 94% 95% 100% 100%  Weight:      Height:        Intake/Output Summary (Last 24 hours) at 09/18/2022 1328 Last data filed at 09/18/2022 0818 Gross per 24 hour  Intake 1730.05 ml  Output 800 ml  Net 930.05 ml     Wt Readings from Last 3 Encounters:  09/15/22 120.2 kg  06/29/22 120.2 kg  12/18/21 127.5 kg   Physical Exam General: Alert and oriented x 3, NAD Cardiovascular: Irregular, tachycardia Respiratory: Bibasilar crackles, decreased breath sounds bilaterally Gastrointestinal: Soft, mild diffuse TTP, nondistended, NBS Ext: no pedal edema bilaterally Neuro: no new deficits Psych: anxious    Data Reviewed:  I have personally reviewed following labs    CBC Lab Results  Component Value Date   WBC 5.3 09/18/2022   RBC 3.99 09/18/2022   HGB 8.9 (L) 09/18/2022   HCT 30.9 (L) 09/18/2022   MCV 77.4 (L) 09/18/2022   MCH 22.3 (L) 09/18/2022   PLT 190 09/18/2022   MCHC 28.8 (L) 09/18/2022   RDW 23.8 (H) 09/18/2022   LYMPHSABS 1.1 09/17/2022   MONOABS 0.4 09/17/2022   EOSABS 0.1 09/17/2022   BASOSABS 0.0 00/17/4944     Last metabolic panel Lab Results  Component Value Date   NA 140 09/18/2022   K 2.9 (L) 09/18/2022   CL 102 09/18/2022   CO2 26 09/18/2022   BUN 39 (H) 09/18/2022   CREATININE 1.99 (H) 09/18/2022   GLUCOSE 87 09/18/2022   GFRNONAA 29 (L) 09/18/2022   GFRAA >60 01/31/2020   CALCIUM 8.3 (L) 09/18/2022   PHOS 4.1 12/03/2018   PROT 6.6 09/18/2022   ALBUMIN 2.7 (L) 09/18/2022   LABGLOB 2.9 10/10/2018   AGRATIO 1.5  10/10/2018   BILITOT 9.4 (H) 09/18/2022   ALKPHOS 69 09/18/2022   AST 53 (H) 09/18/2022   ALT 34 09/18/2022   ANIONGAP 12 09/18/2022    CBG (last 3)  No results for input(s): "GLUCAP" in the last 72 hours.    Coagulation Profile: Recent Labs  Lab 09/16/22 0942 09/17/22 0434  INR 2.0* 2.1*     Radiology  Studies: I have personally reviewed the imaging studies  ECHOCARDIOGRAM COMPLETE  Result Date: 09/17/2022    ECHOCARDIOGRAM REPORT   Patient Name:   Chelsea Ramsey Date of Exam: 09/17/2022 Medical Rec #:  [id number]                 Height:       64.0 in Accession #:    [id number]                Weight:       265.0 lb Date of Birth:  03-16-1966                 BSA:          2.205 m Patient Age:    54 years                  BP:           93/79 mmHg Patient Gender: F                         HR:           110 bpm. Exam Location:  [facility] Procedure: 2D Echo, Color Doppler and Cardiac Doppler Indications:     I50.31 CHF-Acute Diastolic  History:         Patient has prior history of Echocardiogram examinations, most                  recent 11/05/2018. CHF, CAD, COPD; Risk Factors:Hypertension,                  Diabetes and Sleep Apnea.  Sonographer:     Charmayne Sheer Referring Phys:  [id number] [place] TANG Diagnosing Phys: Yolonda Kida MD  Sonographer Comments: Suboptimal apical window and no subcostal window. Image acquisition challenging due to patient body habitus and Image acquisition challenging due to COPD. IMPRESSIONS  1. Left ventricular ejection fraction, by estimation, is 20 to 25%. The left ventricle has severely decreased function. The left ventricle demonstrates global hypokinesis. Left ventricular diastolic parameters are consistent with Grade III diastolic dysfunction (restrictive). There is the interventricular septum is flattened in systole and diastole, consistent with right ventricular pressure and volume overload.  2. Right ventricular systolic function is moderately  reduced. The right ventricular size is moderately enlarged. Mildly increased right ventricular wall thickness.  3. Left atrial size was moderately dilated.  4. Right atrial size was severely dilated.  5. The mitral valve is abnormal. Mild to moderate mitral valve regurgitation.  6. Tricuspid valve regurgitation is severe.  7. The aortic valve is calcified. Aortic valve regurgitation is mild to moderate. Aortic valve sclerosis/calcification is present, without any evidence of aortic stenosis. Conclusion(s)/Recommendation(s): Poor windows for evaluation of left ventricular function by transthoracic echocardiography. Would recommend an alternative means of evaluation. FINDINGS  Left Ventricle: Left ventricular ejection fraction, by estimation, is 20 to 25%. The left ventricle has severely decreased function. The left ventricle demonstrates global hypokinesis. The left ventricular internal cavity size was normal in size. There is borderline concentric left ventricular hypertrophy. The interventricular septum is flattened in systole and diastole, consistent with right ventricular pressure and volume overload. Left ventricular diastolic parameters are consistent with Grade III diastolic dysfunction (restrictive). Right Ventricle: The right ventricular size is moderately enlarged. Mildly increased right ventricular wall thickness. Right ventricular systolic function is moderately reduced. Left Atrium: Left atrial size was moderately dilated. Right Atrium: Right atrial size  was severely dilated. Pericardium: There is no evidence of pericardial effusion. Mitral Valve: The mitral valve is abnormal. Mild to moderate mitral valve regurgitation. Tricuspid Valve: The tricuspid valve is grossly normal. Tricuspid valve regurgitation is severe. Aortic Valve: The aortic valve is calcified. Aortic valve regurgitation is mild to moderate. Aortic regurgitation PHT measures 233 msec. Aortic valve sclerosis/calcification is present,  without any evidence of aortic stenosis. Aortic valve mean gradient measures 6.0 mmHg. Aortic valve peak gradient measures 11.3 mmHg. Aortic valve area, by VTI measures 1.14 cm. Pulmonic Valve: The pulmonic valve was normal in structure. Pulmonic valve regurgitation is mild to moderate. Aorta: The ascending aorta was not well visualized. IAS/Shunts: No atrial level shunt detected by color flow Doppler.  LEFT VENTRICLE PLAX 2D LVIDd:         3.50 cm LVIDs:         3.20 cm LV PW:         1.20 cm LV IVS:        0.90 cm LVOT diam:     1.80 cm LV SV:         25 LV SV Index:   12 LVOT Area:     2.54 cm  RIGHT VENTRICLE RV Basal diam:  5.30 cm RV Mid diam:    5.00 cm RV S prime:     7.62 cm/s LEFT ATRIUM         Index       RIGHT ATRIUM           Index LA diam:    5.50 cm 2.49 cm/m  RA Area:     28.50 cm                                 RA Volume:   104.00 ml 47.17 ml/m  AORTIC VALVE                     PULMONIC VALVE AV Area (Vmax):    0.87 cm      PV Vmax:          0.98 m/s AV Area (Vmean):   0.90 cm      PV Vmean:         69.200 cm/s AV Area (VTI):     1.14 cm      PV VTI:           0.128 m AV Vmax:           168.00 cm/s   PV Peak grad:     3.9 mmHg AV Vmean:          116.000 cm/s  PV Mean grad:     2.5 mmHg AV VTI:            0.223 m       PR End Diast Vel: 10.24 msec AV Peak Grad:      11.3 mmHg AV Mean Grad:      6.0 mmHg LVOT Vmax:         57.60 cm/s LVOT Vmean:        40.900 cm/s LVOT VTI:          0.100 m LVOT/AV VTI ratio: 0.45 AI PHT:            233 msec  AORTA Ao Root diam: 3.10 cm MITRAL VALVE               TRICUSPID VALVE  MV Area (PHT): 5.95 cm    TR Peak grad:   55.7 mmHg MV Decel Time: 128 msec    TR Vmax:        373.00 cm/s MV E velocity: 87.60 cm/s                            SHUNTS                            Systemic VTI:  0.10 m                            Systemic Diam: 1.80 cm Yolonda Kida MD Electronically signed by Yolonda Kida MD Signature Date/Time: 09/17/2022/4:54:34 PM    Final     DG Chest 1 View  Result Date: 09/16/2022 CLINICAL DATA:  Worsening shortness of breath. 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 Cardiomegaly which has increased from 2021. Slight globular configuration of the heart, query pericardial effusion. Suspect small right pleural effusion. Chronic bilateral lung opacities that appears stable. No acute airspace disease or pneumothorax. On limited assessment, no acute osseous findings. IMPRESSION: 1. Increased cardiomegaly from 2021. Slight globular configuration of the heart, query pericardial effusion. 2. Suspect small right pleural effusion. 3. Stable chronic bilateral lung opacities. Electronically Signed   By: Keith Rake M.D.   On: 09/16/2022 17:21       Chelsea Ramsey M.D. [facility] 09/18/2022, 1:28 PM  Available via Epic secure chat 7am-7pm After 7 pm, please refer to night coverage provider listed on amion.

## 2022-09-18 NOTE — Progress Notes (Signed)
Patient continues to feel poorly.   Clinically patient's hyperbilirubinemia is not suggestive of any primary hematologic disorder.  Hyperbilirubinemia is likely secondary to hepatic insufficiency.  I reached out and spoke to patient's PCP- Emelia Loron, regarding patient's reported new medication.  However as per PCP-no medication prescribed to the patient recently.  Informed hospitalist, Dr.Rai.   GB

## 2022-09-18 NOTE — Consult Note (Signed)
ANTICOAGULATION CONSULT NOTE  Pharmacy Consult for Heparin infusion Indication: atrial fibrillation  Allergies  Allergen Reactions   Amiodarone Nausea And Vomiting   Aspirin Swelling   Flexeril [Cyclobenzaprine] Swelling   Trazamine [Trazodone & Diet Manage Prod] Nausea And Vomiting   Codeine Rash   Tramadol Rash    Patient Measurements: Height: '5\' 4"'$  (162.6 cm) Weight: 120.2 kg (264 lb 15.9 oz) IBW/kg (Calculated) : 54.7 Heparin Dosing Weight: 83.9 kg  Vital Signs: Temp: 98.2 F (36.8 C) (10/19 2250) Temp Source: Oral (10/19 1919) BP: 112/85 (10/19 2250) Pulse Rate: 125 (10/19 2250)  Labs: Recent Labs    09/15/22 1259 09/16/22 0325 09/16/22 0942 09/16/22 1919 09/17/22 0242 09/17/22 0434 09/17/22 1002 09/17/22 1807 09/18/22 0056  HGB 9.9* 9.2*  --   --   --  8.8*  8.8*  --   --   --   HCT 35.6* 32.8*  --   --   --  32.1*  31.3*  --   --   --   PLT 269 244  --   --   --  228  225  --   --   --   APTT  --   --  36   < > 175*  --  145* 91* 72*  LABPROT  --   --  22.4*  --   --  23.2*  --   --   --   INR  --   --  2.0*  --   --  2.1*  --   --   --   HEPARINUNFRC  --   --  <0.10*   < > 0.33  --  0.32  --  0.10*  CREATININE 3.54* 3.52*  --   --   --  2.47*  --   --   --    < > = values in this interval not displayed.     Estimated Creatinine Clearance: 32.9 mL/min (A) (by C-G formula based on SCr of 2.47 mg/dL (H)).   Medical History: Past Medical History:  Diagnosis Date   Acute drug-induced gout of left foot 03/01/2018   Last Assessment & Plan:  Likely at least partially brought on by diuresis.  Needs to continue to diurese  Will push hydration Stop allopurinol given initiation during acute flare may worsen this, re-broach this when asymptomatic Avoid nsaids given stomach pain Trial colchicine Add acetaminophen Ice, elevate, rest   Allergy    seasonal   Anxiety    Arthritis    Right Knee   Asthma    CHF (congestive heart failure) (HCC)    COPD  (chronic obstructive pulmonary disease) ([facility])    Coronary artery disease    Leaky heart valve   Diabetes mellitus without complication (HCC)    Dysrhythmia    Fibromyalgia    GERD (gastroesophageal reflux disease)    Hypertension    Pneumonia    PUD (peptic ulcer disease)    Pulmonary HTN (HCC)    Rheumatic fever/heart disease    Sleep apnea     Medications:  Patient on Xarelto '20mg'$  prior to admission (dx A.fib) last dose 10/16  Assessment: 56 yo F with PMH of HF, COPD, CAD, DM, HTN, and Afib (on Xarelto) presents to ED with RUQ / Rt flank/ Rt side of abdomen involved. Pharmacy consulted to manage heparin infusion during inpatient stay. Potential surgical intervention needed. Last Xarelto dose 09/14/2022 per patient history. Pt in significant AKI so if Xarelto is restarted  and renal function hasn't improved, would need to consider a dose reduction to 15 mg daily.  Baseline Labs: aPTT - 36; INR - 2.0 Hgb - 9.9; Plts - 225  Date Time aPTT/HL   Rate/Comment 10/18 1919 aPTT 150 / HL 0.28  aPTT supratherapeutic; aPTT and HL not correlating 10/19 0242 aPTT 175 / HL 0.33  aPTT supratherapeutic; aPTT and HL not correlating 10/19 1002 aPTT 145 / HL 0.32  aPTT supratherapeutic; aPTT and HL not correlating 10/19 1807 aPTT 91   aPTT therapeutic. 10/20 0056 aPTT 72/ HL 0.10  aPTT therapeutic x 2; aPTT & HL not correlating  Goal of Therapy:  Heparin level 0.3-0.7 units/ml aPTT 66-102 seconds Monitor platelets by anticoagulation protocol: Yes   Plan:  aPTT remains therapeutic. Will continue heparin at 1000 units/hr. Recheck aPTT daily w/ AM labs while therapeutic. Heparin level and CBC daily while on heparin infusion. Switch to heparin level once aPTT and heparin level correlate.   Renda Rolls, PharmD, MBA 09/18/2022 1:42 AM

## 2022-09-18 NOTE — Progress Notes (Signed)
SUBJECTIVE: Chelsea Ramsey is a 56 y.o. female with a past medical history of anxiety, CHF, COPD on 4 L chronically, CVA, diabetes, gastric reflux, hypertension, who presented to the emergency department on 09/15/22 for abnormal lab work, AKI. Patient reports persistent abdominal pain that has been ongoing for months, across the entire abdomen, worse in the upper abdomen. Denies chest pain. Shortness of breath unchanged.    Vitals:   09/18/22 0930 09/18/22 1047 09/18/22 1409 09/18/22 1615  BP: 101/88 107/75  100/71  Pulse: (!) 126 (!) 126  (!) 113  Resp: 18 (!) 22  16  Temp:  97.7 F (36.5 C)  97.7 F (36.5 C)  TempSrc:      SpO2: 100% 100% 100% 100%  Weight:      Height:        Intake/Output Summary (Last 24 hours) at 09/18/2022 1637 Last data filed at 09/18/2022 0818 Gross per 24 hour  Intake 1730.05 ml  Output 800 ml  Net 930.05 ml    LABS: Basic Metabolic Panel: Recent Labs    09/17/22 0434 09/18/22 0749  NA 138 140  K 3.2* 2.9*  CL 98 102  CO2 28 26  GLUCOSE 78 87  BUN 45* 39*  CREATININE 2.47* 1.99*  CALCIUM 8.3* 8.3*  MG  --  1.6*   Liver Function Tests: Recent Labs    09/17/22 0434 09/17/22 1807 09/18/22 0749  AST 51*  --  53*  ALT 32  --  34  ALKPHOS 72  --  69  BILITOT 9.3* 9.3* 9.4*  PROT 6.6  --  6.6  ALBUMIN 2.6*  --  2.7*   No results for input(s): "LIPASE", "AMYLASE" in the last 72 hours. CBC: Recent Labs    09/17/22 0434 09/18/22 0617  WBC 5.4  5.5 5.3  NEUTROABS 3.7  --   HGB 8.8*  8.8* 8.9*  HCT 32.1*  31.3* 30.9*  MCV 79.3*  77.9* 77.4*  PLT 228  225 190   Cardiac Enzymes: No results for input(s): "CKTOTAL", "CKMB", "CKMBINDEX", "TROPONINI" in the last 72 hours. BNP: Invalid input(s): "POCBNP" D-Dimer: No results for input(s): "DDIMER" in the last 72 hours. Hemoglobin A1C: No results for input(s): "HGBA1C" in the last 72 hours. Fasting Lipid Panel: No results for input(s): "CHOL", "HDL", "LDLCALC", "TRIG",  "CHOLHDL", "LDLDIRECT" in the last 72 hours. Thyroid Function Tests: No results for input(s): "TSH", "T4TOTAL", "T3FREE", "THYROIDAB" in the last 72 hours.  Invalid input(s): "FREET3" Anemia Panel: Recent Labs    09/17/22 0042  FERRITIN 30  TIBC 416  IRON 31  RETICCTPCT 3.0     PHYSICAL EXAM General: Well developed, well nourished, in no acute distress HEENT:  Normocephalic and atramatic Neck:  No JVD.  Lungs: Clear bilaterally to auscultation and percussion. Heart: HRRR . Normal S1 and S2 without gallops or murmurs.  Abdomen: Bowel sounds are positive, abdomen soft and non-tender  Msk:  Back normal, normal gait. Normal strength and tone for age. Extremities: No clubbing, cyanosis or edema.   Neuro: Alert and oriented X 3. Psych:  Good affect, responds appropriately  TELEMETRY: atrial fibrillation, HR 114 bpm  ASSESSMENT AND PLAN: Patient with history atrial fibrillation, Echo 09/17/22 revealed EF 20-25%. Patient denies chest pain. Shortness of breath and edema improving on IV Lasix, continue. Atrial fibrillation with HR of 114 bpm. Unable to titrate metoprolol up due to hypotension. Digoxin contraindicated due to decreased kidney function. Amiodarone not started due to abnormal liver enzymes. Will continue to follow.  Principal Problem:   Abdominal pain Active Problems:   A-fib ([facility])   Chronic diastolic heart failure ([facility])   HTN (hypertension)   Tobacco dependence   History of cocaine use   Iron deficiency anemia   AKI (acute kidney injury) ([facility])   Elevated LFTs    Jearl Soto, FNP-C 09/18/2022 4:37 PM

## 2022-09-18 NOTE — Progress Notes (Signed)
Physical Therapy Treatment Patient Details Name: Chelsea Ramsey MRN: [id number] DOB: 1966/04/10 Today's Date: 09/18/2022   History of Present Illness Pt is a 56 y.o. female  Seen by pcp today for not feeling well and abd pain for about a month per patient.   Her pcp called and advised she come to ed for abnormal labs. MD assessment includes: Right sided abdominal pain, transaminitis, hyperbilirubinemia, AKI, and iron deficiency anemia.    PT Comments    Pt somewhat lethargic initially but with lights on and verbal stimulation pt became more alert and put forth good effort during the session.  Pt required no physical assistance with sup to sit but did require extra time, effort, and use of bed rails.  Pt demonstrated fair functional strength and stability with transfers and once up reported sudden urgent need for a BM.  Pt took several steps from bed to [facility] with good stability and required no UE assist for safe stand to sit to [facility].  Pt stated that may need significant time to attempt BM so session ended, nursing notified.  Of note, pt's HR at rest was in the 110's and increased to a max of the 130's during functional tasks, no adverse symptoms noted by pt other than abdominal pain.  Pt will benefit from HHPT upon discharge to safely address deficits listed in patient problem list for decreased caregiver assistance and eventual return to PLOF.    Recommendations for follow up therapy are one component of a multi-disciplinary discharge planning process, led by the attending physician.  Recommendations may be updated based on patient status, additional functional criteria and insurance authorization.  Follow Up Recommendations  Home health PT     Assistance Recommended at Discharge Frequent or constant Supervision/Assistance  Patient can return home with the following A little help with walking and/or transfers;A lot of help with bathing/dressing/bathroom;Assistance with  cooking/housework;Assist for transportation;Help with stairs or ramp for entrance   Equipment Recommendations  None recommended by PT    Recommendations for Other Services       Precautions / Restrictions Precautions Precautions: Fall Restrictions Weight Bearing Restrictions: No Other Position/Activity Restrictions: Watch HR     Mobility  Bed Mobility Overal bed mobility: Needs Assistance         Sit to supine: Supervision   General bed mobility comments: Extra time, effort, and use of bed rail only during sup to sit, no physical assist needed    Transfers Overall transfer level: Needs assistance Equipment used: Rolling walker (2 wheels) Transfers: Sit to/from Stand Sit to Stand: Min guard           General transfer comment: Fair eccentric and concentric control and stability with no assistance or UE assist needed during transfers to/from multiple height surfaces    Ambulation/Gait Ambulation/Gait assistance: Min guard Gait Distance (Feet): 2 Feet Assistive device: Rolling walker (2 wheels) Gait Pattern/deviations: Step-through pattern, Decreased step length - right, Decreased step length - left Gait velocity: decreased     General Gait Details: Pt able to take several small steps with amb distance limited by pt needed urgent BM   Stairs             Wheelchair Mobility    Modified Rankin (Stroke Patients Only)       Balance Overall balance assessment: Needs assistance Sitting-balance support: No upper extremity supported, Feet supported Sitting balance-Leahy Scale: Good     Standing balance support: Bilateral upper extremity supported, During functional activity Standing balance-Leahy Scale:  Fair                              Cognition Arousal/Alertness: Lethargic Behavior During Therapy: Flat affect Overall Cognitive Status: No family/caregiver present to determine baseline cognitive functioning                                           Exercises      General Comments        Pertinent Vitals/Pain Pain Assessment Pain Assessment: 0-10 Pain Score: 5  Pain Location: abdominal pain Pain Descriptors / Indicators: Sore Pain Intervention(s): Repositioned, Premedicated before session, Monitored during session    Home Living Family/patient expects to be discharged to:: Private residence Living Arrangements: Spouse/significant other Available Help at Discharge: Available PRN/intermittently (husband works during the day) Type of Home: House Home Access: Stairs to enter Entrance Stairs-Rails: None Technical brewer of Steps: 2   Home Layout: One level Home Equipment: Conservation officer, nature (2 wheels);Cane - single point      Prior Function            PT Goals (current goals can now be found in the care plan section) Progress towards PT goals: PT to reassess next treatment    Frequency    Min 2X/week      PT Plan Current plan remains appropriate    Co-evaluation              AM-PAC PT "6 Clicks" Mobility   Outcome Measure  Help needed turning from your back to your side while in a flat bed without using bedrails?: A Little Help needed moving from lying on your back to sitting on the side of a flat bed without using bedrails?: A Little Help needed moving to and from a bed to a chair (including a wheelchair)?: A Little Help needed standing up from a chair using your arms (e.g., wheelchair or bedside chair)?: A Little Help needed to walk in hospital room?: A Lot Help needed climbing 3-5 steps with a railing? : A Lot 6 Click Score: 16    End of Session Equipment Utilized During Treatment: Gait belt;Oxygen Activity Tolerance: Patient tolerated treatment well Patient left: Other (comment);with call bell/phone within reach (Pt left on [facility], nursing notified) Nurse Communication: Mobility status;Other (comment) (pt left on [facility] for BM with call bell in reach) PT Visit  Diagnosis: Muscle weakness (generalized) (M62.81);Difficulty in walking, not elsewhere classified (R26.2);Unsteadiness on feet (R26.81)     Time: 4098-1191 PT Time Calculation (min) (ACUTE ONLY): 13 min  Charges:  $Therapeutic Activity: 8-22 mins                    D. Scott Julicia Krieger PT, DPT 09/18/22, 10:24 AM

## 2022-09-18 NOTE — Consult Note (Signed)
ANTICOAGULATION CONSULT NOTE  Pharmacy Consult for Heparin infusion Indication: atrial fibrillation  Allergies  Allergen Reactions   Amiodarone Nausea And Vomiting   Aspirin Swelling   Flexeril [Cyclobenzaprine] Swelling   Trazamine [Trazodone & Diet Manage Prod] Nausea And Vomiting   Codeine Rash   Tramadol Rash    Patient Measurements: Height: '5\' 4"'$  (162.6 cm) Weight: 120.2 kg (264 lb 15.9 oz) IBW/kg (Calculated) : 54.7 Heparin Dosing Weight: 83.9 kg  Vital Signs: Temp: 97.7 F (36.5 C) (10/20 1615) Temp Source: Oral (10/20 0848) BP: 100/71 (10/20 1615) Pulse Rate: 113 (10/20 1615)  Labs: Recent Labs    09/16/22 0325 09/16/22 0942 09/16/22 1919 09/17/22 0434 09/17/22 1002 09/17/22 1807 09/18/22 0056 09/18/22 0617 09/18/22 0749 09/18/22 1435  HGB 9.2*  --   --  8.8*  8.8*  --   --   --  8.9*  --   --   HCT 32.8*  --   --  32.1*  31.3*  --   --   --  30.9*  --   --   PLT 244  --   --  228  225  --   --   --  190  --   --   APTT  --  36   < >  --  145*   < > 72* 56*  --  128*  LABPROT  --  22.4*  --  23.2*  --   --   --   --   --   --   INR  --  2.0*  --  2.1*  --   --   --   --   --   --   HEPARINUNFRC  --  <0.10*   < >  --  0.32  --  0.10*  --   --  0.23*  CREATININE 3.52*  --   --  2.47*  --   --   --   --  1.99*  --    < > = values in this interval not displayed.     Estimated Creatinine Clearance: 40.8 mL/min (A) (by C-G formula based on SCr of 1.99 mg/dL (H)).   Medical History: Past Medical History:  Diagnosis Date   Acute drug-induced gout of left foot 03/01/2018   Last Assessment & Plan:  Likely at least partially brought on by diuresis.  Needs to continue to diurese  Will push hydration Stop allopurinol given initiation during acute flare may worsen this, re-broach this when asymptomatic Avoid nsaids given stomach pain Trial colchicine Add acetaminophen Ice, elevate, rest   Allergy    seasonal   Anxiety    Arthritis    Right Knee   Asthma     CHF (congestive heart failure) (HCC)    COPD (chronic obstructive pulmonary disease) (HCC)    Coronary artery disease    Leaky heart valve   Diabetes mellitus without complication (HCC)    Dysrhythmia    Fibromyalgia    GERD (gastroesophageal reflux disease)    Hypertension    Pneumonia    PUD (peptic ulcer disease)    Pulmonary HTN (HCC)    Rheumatic fever/heart disease    Sleep apnea   Heparin Dosing Weight: 83.9 kg  Medications:  Patient on Xarelto '20mg'$  prior to admission (dx A.fib) last dose 10/16  Assessment: 56 yo F with PMH of HF, COPD, CAD, DM, HTN, and Afib (on Xarelto) presents to ED with RUQ / Rt flank/ Rt side of  abdomen involved. Pharmacy consulted to manage heparin infusion during inpatient stay. Potential surgical intervention needed. Last Xarelto dose 09/14/2022 per patient history. Pt in significant AKI so if Xarelto is restarted and renal function hasn't improved, would need to consider a dose reduction to 15 mg daily.  Baseline Labs: aPTT - 36; INR - 2.0 Hgb - 9.9; Plts - 225  Date Time aPTT/HL   Rate/Comment 10/18 1919 aPTT 150 / HL 0.28  aPTT Supra;  10/19 0242 aPTT 175 / HL 0.33  aPTT Supra;  10/19 1002 aPTT 145 / HL 0.32  aPTT Supra;  10/19 1807 aPTT 91   aPTT Thera. 10/20 0056 aPTT 72/ HL 0.10  aPTT Thera x 2;  10/20 0617 aPTT 56,    aPTT SUBthera;  1000 > 1150 un/h 10/20 1435 aPTT 128s   aPTT Supra; 1150>1100 un/hr  Goal of Therapy:  Heparin level 0.3-0.7 units/ml aPTT 66-102 seconds Monitor platelets by anticoagulation protocol: Yes   Plan:  aPTT now supratherapeutic; w/o recent HL to assess correlation Decrease heparin to 1100 units/hr.  Recheck aPTT & HL in 6 hrs after rate change.  Heparin level and CBC daily while on heparin infusion.  Switch to heparin level once aPTT and heparin level correlate.   Lorna Dibble, PharmD, [facility] Clinical Pharmacist 09/18/2022 4:38 PM

## 2022-09-18 NOTE — Consult Note (Signed)
ANTICOAGULATION CONSULT NOTE  Pharmacy Consult for Heparin infusion Indication: atrial fibrillation  Allergies  Allergen Reactions   Amiodarone Nausea And Vomiting   Aspirin Swelling   Flexeril [Cyclobenzaprine] Swelling   Trazamine [Trazodone & Diet Manage Prod] Nausea And Vomiting   Codeine Rash   Tramadol Rash    Patient Measurements: Height: '5\' 4"'$  (162.6 cm) Weight: 120.2 kg (264 lb 15.9 oz) IBW/kg (Calculated) : 54.7 Heparin Dosing Weight: 83.9 kg  Vital Signs: Temp: 98.1 F (36.7 C) (10/20 0423) Temp Source: Oral (10/20 0423) BP: 96/67 (10/20 0423) Pulse Rate: 124 (10/20 0423)  Labs: Recent Labs    09/15/22 1259 09/16/22 0325 09/16/22 0942 09/16/22 1919 09/17/22 0242 09/17/22 0434 09/17/22 1002 09/17/22 1807 09/18/22 0056 09/18/22 0617  HGB 9.9* 9.2*  --   --   --  8.8*  8.8*  --   --   --  8.9*  HCT 35.6* 32.8*  --   --   --  32.1*  31.3*  --   --   --  30.9*  PLT 269 244  --   --   --  228  225  --   --   --  190  APTT  --   --  36   < > 175*  --  145* 91* 72* 56*  LABPROT  --   --  22.4*  --   --  23.2*  --   --   --   --   INR  --   --  2.0*  --   --  2.1*  --   --   --   --   HEPARINUNFRC  --   --  <0.10*   < > 0.33  --  0.32  --  0.10*  --   CREATININE 3.54* 3.52*  --   --   --  2.47*  --   --   --   --    < > = values in this interval not displayed.     Estimated Creatinine Clearance: 32.9 mL/min (A) (by C-G formula based on SCr of 2.47 mg/dL (H)).   Medical History: Past Medical History:  Diagnosis Date   Acute drug-induced gout of left foot 03/01/2018   Last Assessment & Plan:  Likely at least partially brought on by diuresis.  Needs to continue to diurese  Will push hydration Stop allopurinol given initiation during acute flare may worsen this, re-broach this when asymptomatic Avoid nsaids given stomach pain Trial colchicine Add acetaminophen Ice, elevate, rest   Allergy    seasonal   Anxiety    Arthritis    Right Knee   Asthma     CHF (congestive heart failure) (HCC)    COPD (chronic obstructive pulmonary disease) (HCC)    Coronary artery disease    Leaky heart valve   Diabetes mellitus without complication (HCC)    Dysrhythmia    Fibromyalgia    GERD (gastroesophageal reflux disease)    Hypertension    Pneumonia    PUD (peptic ulcer disease)    Pulmonary HTN (HCC)    Rheumatic fever/heart disease    Sleep apnea     Medications:  Patient on Xarelto '20mg'$  prior to admission (dx A.fib) last dose 10/16  Assessment: 56 yo F with PMH of HF, COPD, CAD, DM, HTN, and Afib (on Xarelto) presents to ED with RUQ / Rt flank/ Rt side of abdomen involved. Pharmacy consulted to manage heparin infusion during inpatient stay. Potential surgical  intervention needed. Last Xarelto dose 09/14/2022 per patient history. Pt in significant AKI so if Xarelto is restarted and renal function hasn't improved, would need to consider a dose reduction to 15 mg daily.  Baseline Labs: aPTT - 36; INR - 2.0 Hgb - 9.9; Plts - 225  Date Time aPTT/HL   Rate/Comment 10/18 1919 aPTT 150 / HL 0.28  aPTT supratherapeutic; aPTT and HL not correlating 10/19 0242 aPTT 175 / HL 0.33  aPTT supratherapeutic; aPTT and HL not correlating 10/19 1002 aPTT 145 / HL 0.32  aPTT supratherapeutic; aPTT and HL not correlating 10/19 1807 aPTT 91   aPTT therapeutic. 10/20 0056 aPTT 72/ HL 0.10  aPTT therapeutic x 2; aPTT & HL not correlating 10/20 0617 aPTT 56, subtherapeutic  Goal of Therapy:  Heparin level 0.3-0.7 units/ml aPTT 66-102 seconds Monitor platelets by anticoagulation protocol: Yes   Plan:  aPTT now subtherapeutic Bolus 1300 units x 1 Increase heparin to 1150 units/hr.  Recheck aPTT & HL in 6 hrs after rate change. Heparin level and CBC daily while on heparin infusion. Switch to heparin level once aPTT and heparin level correlate.   Renda Rolls, PharmD, MBA 09/18/2022 7:02 AM

## 2022-09-18 NOTE — ED Notes (Signed)
Multiple blankets and soiled sheets under pt. Soiled bedding removed, new bedding placed, new chucks pads placed. Pt cleaned and placed in clean gown and socks. New purewick placed. Pt repositioned in bed for comfort. Pt denies needs, call light is in reach. WCTM. PT at bedside at this time.

## 2022-09-19 DIAGNOSIS — N179 Acute kidney failure, unspecified: Secondary | ICD-10-CM | POA: Diagnosis not present

## 2022-09-19 DIAGNOSIS — I480 Paroxysmal atrial fibrillation: Secondary | ICD-10-CM | POA: Diagnosis not present

## 2022-09-19 DIAGNOSIS — R1010 Upper abdominal pain, unspecified: Secondary | ICD-10-CM | POA: Diagnosis not present

## 2022-09-19 LAB — COMPREHENSIVE METABOLIC PANEL
ALT: 31 U/L (ref 0–44)
AST: 50 U/L — ABNORMAL HIGH (ref 15–41)
Albumin: 2.7 g/dL — ABNORMAL LOW (ref 3.5–5.0)
Alkaline Phosphatase: 72 U/L (ref 38–126)
Anion gap: 11 (ref 5–15)
BUN: 36 mg/dL — ABNORMAL HIGH (ref 6–20)
CO2: 28 mmol/L (ref 22–32)
Calcium: 8.3 mg/dL — ABNORMAL LOW (ref 8.9–10.3)
Chloride: 100 mmol/L (ref 98–111)
Creatinine, Ser: 1.97 mg/dL — ABNORMAL HIGH (ref 0.44–1.00)
GFR, Estimated: 29 mL/min — ABNORMAL LOW (ref >60)
Glucose, Bld: 79 mg/dL (ref 70–99)
Potassium: 3.2 mmol/L — ABNORMAL LOW (ref 3.5–5.1)
Sodium: 139 mmol/L (ref 135–145)
Total Bilirubin: 9.4 mg/dL — ABNORMAL HIGH (ref 0.3–1.2)
Total Protein: 6.7 g/dL (ref 6.5–8.1)

## 2022-09-19 LAB — CBC
HCT: 31.8 % — ABNORMAL LOW (ref 36.0–46.0)
Hemoglobin: 9.1 g/dL — ABNORMAL LOW (ref 12.0–15.0)
MCH: 22.2 pg — ABNORMAL LOW (ref 26.0–34.0)
MCHC: 28.6 g/dL — ABNORMAL LOW (ref 30.0–36.0)
MCV: 77.6 fL — ABNORMAL LOW (ref 80.0–100.0)
Platelets: 203 10*3/uL (ref 150–400)
RBC: 4.1 MIL/uL (ref 3.87–5.11)
RDW: 22.8 % — ABNORMAL HIGH (ref 11.5–15.5)
WBC: 5.8 10*3/uL (ref 4.0–10.5)
nRBC: 0.5 % — ABNORMAL HIGH (ref 0.0–0.2)

## 2022-09-19 LAB — APTT
aPTT: 113 seconds — ABNORMAL HIGH (ref 24–36)
aPTT: 122 seconds — ABNORMAL HIGH (ref 24–36)

## 2022-09-19 LAB — HEPARIN LEVEL (UNFRACTIONATED)
Heparin Unfractionated: 0.22 IU/mL — ABNORMAL LOW (ref 0.30–0.70)
Heparin Unfractionated: 0.15 IU/mL — ABNORMAL LOW (ref 0.30–0.70)
Heparin Unfractionated: 0.3 IU/mL (ref 0.30–0.70)

## 2022-09-19 MED ORDER — MIDODRINE HCL 5 MG PO TABS
10.0000 mg | ORAL_TABLET | Freq: Three times a day (TID) | ORAL | Status: DC
  Administered 2022-09-19 – 2022-09-28 (×25): 10 mg via ORAL
  Filled 2022-09-19 (×25): qty 2

## 2022-09-19 MED ORDER — IPRATROPIUM-ALBUTEROL 0.5-2.5 (3) MG/3ML IN SOLN
3.0000 mL | Freq: Three times a day (TID) | RESPIRATORY_TRACT | Status: DC
  Administered 2022-09-19 – 2022-09-20 (×3): 3 mL via RESPIRATORY_TRACT
  Filled 2022-09-19 (×3): qty 3

## 2022-09-19 MED ORDER — ALBUTEROL SULFATE (2.5 MG/3ML) 0.083% IN NEBU: 2.5000 mg | INHALATION_SOLUTION | RESPIRATORY_TRACT | Status: DC | PRN

## 2022-09-19 MED ORDER — POTASSIUM CHLORIDE CRYS ER 20 MEQ PO TBCR
40.0000 meq | EXTENDED_RELEASE_TABLET | Freq: Every day | ORAL | Status: DC
  Administered 2022-09-19 – 2022-09-21 (×3): 40 meq via ORAL
  Filled 2022-09-19 (×3): qty 2

## 2022-09-19 MED ORDER — HEPARIN BOLUS VIA INFUSION
2500.0000 [IU] | Freq: Once | INTRAVENOUS | Status: AC
  Administered 2022-09-19: 2500 [IU] via INTRAVENOUS
  Filled 2022-09-19: qty 2500

## 2022-09-19 NOTE — Consult Note (Signed)
ANTICOAGULATION CONSULT NOTE  Pharmacy Consult for Heparin infusion Indication: atrial fibrillation  Allergies  Allergen Reactions   Amiodarone Nausea And Vomiting   Aspirin Swelling   Flexeril [Cyclobenzaprine] Swelling   Trazamine [Trazodone & Diet Manage Prod] Nausea And Vomiting   Codeine Rash   Tramadol Rash    Patient Measurements: Height: '5\' 4"'$  (162.6 cm) Weight: 113 kg (249 lb 1.9 oz) IBW/kg (Calculated) : 54.7 Heparin Dosing Weight: 83.9 kg  Vital Signs: Temp: 97.7 F (36.5 C) (10/21 0742) Temp Source: Oral (10/21 0426) BP: 97/68 (10/21 0742) Pulse Rate: 99 (10/21 0743)  Labs: Recent Labs    09/16/22 0942 09/16/22 1919 09/17/22 0434 09/17/22 1002 09/18/22 0617 09/18/22 0749 09/18/22 1435 09/18/22 2319 09/19/22 0447 09/19/22 0758 09/19/22 0846  HGB  --    < > 8.8*  8.8*  --  8.9*  --   --   --  9.1*  --   --   HCT  --   --  32.1*  31.3*  --  30.9*  --   --   --  31.8*  --   --   PLT  --   --  228  225  --  190  --   --   --  203  --   --   APTT 36   < >  --    < > 56*  --  128* 113*  --  122*  --   LABPROT 22.4*  --  23.2*  --   --   --   --   --   --   --   --   INR 2.0*  --  2.1*  --   --   --   --   --   --   --   --   HEPARINUNFRC <0.10*   < >  --    < >  --   --  0.23* 0.22*  --   --  0.15*  CREATININE  --   --  2.47*  --   --  1.99*  --   --  1.97*  --   --    < > = values in this interval not displayed.     Estimated Creatinine Clearance: 39.7 mL/min (A) (by C-G formula based on SCr of 1.97 mg/dL (H)).   Medical History: Past Medical History:  Diagnosis Date   Acute drug-induced gout of left foot 03/01/2018   Last Assessment & Plan:  Likely at least partially brought on by diuresis.  Needs to continue to diurese  Will push hydration Stop allopurinol given initiation during acute flare may worsen this, re-broach this when asymptomatic Avoid nsaids given stomach pain Trial colchicine Add acetaminophen Ice, elevate, rest   Allergy     seasonal   Anxiety    Arthritis    Right Knee   Asthma    CHF (congestive heart failure) (HCC)    COPD (chronic obstructive pulmonary disease) (HCC)    Coronary artery disease    Leaky heart valve   Diabetes mellitus without complication (HCC)    Dysrhythmia    Fibromyalgia    GERD (gastroesophageal reflux disease)    Hypertension    Pneumonia    PUD (peptic ulcer disease)    Pulmonary HTN (HCC)    Rheumatic fever/heart disease    Sleep apnea   Heparin Dosing Weight: 83.9 kg  Medications:  Patient on Xarelto '20mg'$  prior to admission (dx A.fib) last dose 10/16  Assessment: 56 yo F with PMH of HF, COPD, CAD, DM, HTN, and Afib (on Xarelto) presents to ED with RUQ / Rt flank/ Rt side of abdomen involved. Pharmacy consulted to manage heparin infusion during inpatient stay. Potential surgical intervention needed. Last Xarelto dose 09/14/2022 per patient history. Pt in significant AKI so if Xarelto is restarted and renal function hasn't improved, would need to consider a dose reduction to 15 mg daily.  Baseline Labs: aPTT - 36; INR - 2.0 Hgb - 9.9; Plts - 225  Date Time aPTT/HL   Rate/Comment 10/18 1919 aPTT 150 / HL 0.28  aPTT Supra;  10/19 0242 aPTT 175 / HL 0.33  aPTT Supra;  10/19 1002 aPTT 145 / HL 0.32  aPTT Supra;  10/19 1807 aPTT 91   aPTT Thera. 10/20 0056 aPTT 72/ HL 0.10  aPTT Thera x 2;  10/20 0617 aPTT 56,    aPTT SUBthera;  1000 > 1150 un/h 10/20 1435 aPTT 128s   aPTT Supra; 1150>1100 un/hr 10/20 2319 aPTT 113, HL 0.22  aPTT Supra; 10/21 0846 aPTT 122, HL 0.15  Goal of Therapy:  Heparin level 0.3-0.7 units/ml aPTT 66-102 seconds Monitor platelets by anticoagulation protocol: Yes   Plan:  aPTT supratherapeutic; HL subtherapeutic. Continued non-correlation possible indication of other factors resulting in elevated aPTT levels. Will transition the HL dosing now that it has been>5 days since last DOAC dose and HL currently subtherapeutic. 2500 unit bolus x  1 Increase heparin to 1300 units/hr.  Recheck HL in 6 hrs after rate change.  Heparin level and CBC daily while on heparin infusion.    Pearla Dubonnet, PharmD Clinical Pharmacist 09/19/2022 9:29 AM

## 2022-09-19 NOTE — TOC Progression Note (Signed)
Transition of Care [facility]) - Progression Note    Patient Details  Name: Talene Glastetter MRN: [id number] Date of Birth: 1965/12/11  Transition of [facility]) CM/SW Contact  Zigmund Daniel Dorian Pod, RN Phone Number: [phone] 09/19/2022, 5:22 PM  Clinical Narrative:    Request for HHPT however unsuccessful outreach to speak with pt (sleeping). Spoke with parents Mr. & Mrs. Clide Dales concerning HHPT recommendations. Both agreed and HHPT was accepted with Alvis Lemmings Tommi Rumps). Parents made aware the Alvis Lemmings will service pt for HHPT.   TOC remains available for additional needs.       Expected Discharge Plan and Services                                                 Social Determinants of Health (SDOH) Interventions    Readmission Risk Interventions     No data to display

## 2022-09-19 NOTE — Consult Note (Signed)
ANTICOAGULATION CONSULT NOTE  Pharmacy Consult for Heparin infusion Indication: atrial fibrillation  Allergies  Allergen Reactions   Amiodarone Nausea And Vomiting   Aspirin Swelling   Flexeril [Cyclobenzaprine] Swelling   Trazamine [Trazodone & Diet Manage Prod] Nausea And Vomiting   Codeine Rash   Tramadol Rash    Patient Measurements: Height: '5\' 4"'$  (162.6 cm) Weight: 120.2 kg (264 lb 15.9 oz) IBW/kg (Calculated) : 54.7 Heparin Dosing Weight: 83.9 kg  Vital Signs: Temp: 97.7 F (36.5 C) (10/21 0012) Temp Source: Oral (10/21 0012) BP: 82/57 (10/21 0012) Pulse Rate: 91 (10/21 0013)  Labs: Recent Labs    09/16/22 0325 09/16/22 0942 09/16/22 1919 09/17/22 0434 09/17/22 1002 09/18/22 0056 09/18/22 0617 09/18/22 0749 09/18/22 1435 09/18/22 2319  HGB 9.2*  --   --  8.8*  8.8*  --   --  8.9*  --   --   --   HCT 32.8*  --   --  32.1*  31.3*  --   --  30.9*  --   --   --   PLT 244  --   --  228  225  --   --  190  --   --   --   APTT  --  36   < >  --    < > 72* 56*  --  128* 113*  LABPROT  --  22.4*  --  23.2*  --   --   --   --   --   --   INR  --  2.0*  --  2.1*  --   --   --   --   --   --   HEPARINUNFRC  --  <0.10*   < >  --    < > 0.10*  --   --  0.23* 0.22*  CREATININE 3.52*  --   --  2.47*  --   --   --  1.99*  --   --    < > = values in this interval not displayed.     Estimated Creatinine Clearance: 40.8 mL/min (A) (by C-G formula based on SCr of 1.99 mg/dL (H)).   Medical History: Past Medical History:  Diagnosis Date   Acute drug-induced gout of left foot 03/01/2018   Last Assessment & Plan:  Likely at least partially brought on by diuresis.  Needs to continue to diurese  Will push hydration Stop allopurinol given initiation during acute flare may worsen this, re-broach this when asymptomatic Avoid nsaids given stomach pain Trial colchicine Add acetaminophen Ice, elevate, rest   Allergy    seasonal   Anxiety    Arthritis    Right Knee   Asthma     CHF (congestive heart failure) (HCC)    COPD (chronic obstructive pulmonary disease) (HCC)    Coronary artery disease    Leaky heart valve   Diabetes mellitus without complication (HCC)    Dysrhythmia    Fibromyalgia    GERD (gastroesophageal reflux disease)    Hypertension    Pneumonia    PUD (peptic ulcer disease)    Pulmonary HTN (HCC)    Rheumatic fever/heart disease    Sleep apnea   Heparin Dosing Weight: 83.9 kg  Medications:  Patient on Xarelto '20mg'$  prior to admission (dx A.fib) last dose 10/16  Assessment: 56 yo F with PMH of HF, COPD, CAD, DM, HTN, and Afib (on Xarelto) presents to ED with RUQ / Rt flank/ Rt side  of abdomen involved. Pharmacy consulted to manage heparin infusion during inpatient stay. Potential surgical intervention needed. Last Xarelto dose 09/14/2022 per patient history. Pt in significant AKI so if Xarelto is restarted and renal function hasn't improved, would need to consider a dose reduction to 15 mg daily.  Baseline Labs: aPTT - 36; INR - 2.0 Hgb - 9.9; Plts - 225  Date Time aPTT/HL   Rate/Comment 10/18 1919 aPTT 150 / HL 0.28  aPTT Supra;  10/19 0242 aPTT 175 / HL 0.33  aPTT Supra;  10/19 1002 aPTT 145 / HL 0.32  aPTT Supra;  10/19 1807 aPTT 91   aPTT Thera. 10/20 0056 aPTT 72/ HL 0.10  aPTT Thera x 2;  10/20 0617 aPTT 56,    aPTT SUBthera;  1000 > 1150 un/h 10/20 1435 aPTT 128s   aPTT Supra; 1150>1100 un/hr 10/20 2319 aPTT 113, HL 0.22  Goal of Therapy:  Heparin level 0.3-0.7 units/ml aPTT 66-102 seconds Monitor platelets by anticoagulation protocol: Yes   Plan:  aPTT supratherapeutic; HL subtherapeutic. Continued non-correlation possible indication of other factors resulting in elevated aPTT levels Decrease heparin to 1050 units/hr.  Recheck aPTT in 6 hrs after rate change.  Heparin level and CBC daily while on heparin infusion.  Switch to heparin level once aPTT and heparin level correlate.   Renda Rolls, PharmD,  MBA 09/19/2022 1:26 AM

## 2022-09-19 NOTE — Progress Notes (Signed)
SUBJECTIVE: Chelsea Ramsey is a 56 y.o. female with a past medical history of anxiety, CHF, COPD on 4 L chronically, CVA, diabetes, gastric reflux, hypertension, who presented to the emergency department on 09/15/22 for abnormal lab work, AKI. Patient reports persistent abdominal pain that has been ongoing for months, across the entire abdomen, worse in the upper abdomen. Denies chest pain. Shortness of breath unchanged. Feels poorly overall.    Vitals:   09/19/22 0541 09/19/22 0742 09/19/22 0743 09/19/22 1207  BP:  97/68  97/61  Pulse:  100 99 (!) 110  Resp:  '17 18 18  '$ Temp:  97.7 F (36.5 C)  97.8 F (36.6 C)  TempSrc:      SpO2:  98% 98% 99%  Weight: 113 kg     Height:        Intake/Output Summary (Last 24 hours) at 09/19/2022 1330 Last data filed at 09/18/2022 2011 Gross per 24 hour  Intake --  Output 500 ml  Net -500 ml    LABS: Basic Metabolic Panel: Recent Labs    09/18/22 0749 09/19/22 0447  NA 140 139  K 2.9* 3.2*  CL 102 100  CO2 26 28  GLUCOSE 87 79  BUN 39* 36*  CREATININE 1.99* 1.97*  CALCIUM 8.3* 8.3*  MG 1.6*  --    Liver Function Tests: Recent Labs    09/18/22 0749 09/19/22 0447  AST 53* 50*  ALT 34 31  ALKPHOS 69 72  BILITOT 9.4* 9.4*  PROT 6.6 6.7  ALBUMIN 2.7* 2.7*   No results for input(s): "LIPASE", "AMYLASE" in the last 72 hours. CBC: Recent Labs    09/17/22 0434 09/18/22 0617 09/19/22 0447  WBC 5.4  5.5 5.3 5.8  NEUTROABS 3.7  --   --   HGB 8.8*  8.8* 8.9* 9.1*  HCT 32.1*  31.3* 30.9* 31.8*  MCV 79.3*  77.9* 77.4* 77.6*  PLT 228  225 190 203   Cardiac Enzymes: No results for input(s): "CKTOTAL", "CKMB", "CKMBINDEX", "TROPONINI" in the last 72 hours. BNP: Invalid input(s): "POCBNP" D-Dimer: No results for input(s): "DDIMER" in the last 72 hours. Hemoglobin A1C: No results for input(s): "HGBA1C" in the last 72 hours. Fasting Lipid Panel: No results for input(s): "CHOL", "HDL", "LDLCALC", "TRIG",  "CHOLHDL", "LDLDIRECT" in the last 72 hours. Thyroid Function Tests: No results for input(s): "TSH", "T4TOTAL", "T3FREE", "THYROIDAB" in the last 72 hours.  Invalid input(s): "FREET3" Anemia Panel: Recent Labs    09/17/22 0042  FERRITIN 30  TIBC 416  IRON 31  RETICCTPCT 3.0     PHYSICAL EXAM General: Well developed, well nourished, in no acute distress HEENT:  Normocephalic and atramatic Neck:  No JVD.  Lungs: Clear bilaterally to auscultation and percussion. Heart: HRRR . Normal S1 and S2 without gallops or murmurs.  Abdomen: Bowel sounds are positive, abdomen soft and non-tender  Msk:  Back normal, normal gait. Normal strength and tone for age. Extremities: No clubbing, cyanosis or edema.   Neuro: Alert and oriented X 3. Psych:  Good affect, responds appropriately  TELEMETRY: atrial fibrillation, HR 76 bpm  ASSESSMENT AND PLAN: Patient resting in bed, in no acute distress. Patient complains of feeling poorly, short of breath. States pain medication she is receiving is not helping her pain. Patient heart rate remains elevated, unable to receive Cardizem due to soft b/p. Midodrine started yesterday, first dose this morning. Will continue to monitor.   Principal Problem:   Abdominal pain Active Problems:   A-fib (Chelsea Ramsey)  Chronic diastolic heart failure (HCC)   HTN (hypertension)   Tobacco dependence   History of cocaine use   Iron deficiency anemia   AKI (acute kidney injury) (Chelsea Ramsey)   Elevated LFTs    Lavarius Doughten, FNP-C 09/19/2022 1:30 PM

## 2022-09-19 NOTE — Consult Note (Addendum)
ANTICOAGULATION CONSULT NOTE  Pharmacy Consult for Heparin infusion Indication: atrial fibrillation  Allergies  Allergen Reactions   Amiodarone Nausea And Vomiting   Aspirin Swelling   Flexeril [Cyclobenzaprine] Swelling   Trazamine [Trazodone & Diet Manage Prod] Nausea And Vomiting   Codeine Rash   Tramadol Rash    Patient Measurements: Height: '5\' 4"'$  (162.6 cm) Weight: 113 kg (249 lb 1.9 oz) IBW/kg (Calculated) : 54.7 Heparin Dosing Weight: 83.9 kg  Vital Signs: Temp: 97.9 F (36.6 C) (10/21 1936) Temp Source: Oral (10/21 1936) BP: 100/66 (10/21 1936) Pulse Rate: 99 (10/21 1936)  Labs: Recent Labs    09/17/22 0434 09/17/22 1002 09/18/22 0617 09/18/22 0749 09/18/22 1435 09/18/22 2319 09/19/22 0447 09/19/22 0758 09/19/22 0846 09/19/22 1538  HGB 8.8*  8.8*  --  8.9*  --   --   --  9.1*  --   --   --   HCT 32.1*  31.3*  --  30.9*  --   --   --  31.8*  --   --   --   PLT 228  225  --  190  --   --   --  203  --   --   --   APTT  --    < > 56*  --  128* 113*  --  122*  --   --   LABPROT 23.2*  --   --   --   --   --   --   --   --   --   INR 2.1*  --   --   --   --   --   --   --   --   --   HEPARINUNFRC  --    < >  --   --  0.23* 0.22*  --   --  0.15* 0.30  CREATININE 2.47*  --   --  1.99*  --   --  1.97*  --   --   --    < > = values in this interval not displayed.     Estimated Creatinine Clearance: 39.7 mL/min (A) (by C-G formula based on SCr of 1.97 mg/dL (H)).   Medical History: Past Medical History:  Diagnosis Date   Acute drug-induced gout of left foot 03/01/2018   Last Assessment & Plan:  Likely at least partially brought on by diuresis.  Needs to continue to diurese  Will push hydration Stop allopurinol given initiation during acute flare may worsen this, re-broach this when asymptomatic Avoid nsaids given stomach pain Trial colchicine Add acetaminophen Ice, elevate, rest   Allergy    seasonal   Anxiety    Arthritis    Right Knee   Asthma     CHF (congestive heart failure) (HCC)    COPD (chronic obstructive pulmonary disease) (HCC)    Coronary artery disease    Leaky heart valve   Diabetes mellitus without complication (HCC)    Dysrhythmia    Fibromyalgia    GERD (gastroesophageal reflux disease)    Hypertension    Pneumonia    PUD (peptic ulcer disease)    Pulmonary HTN (HCC)    Rheumatic fever/heart disease    Sleep apnea   Heparin Dosing Weight: 83.9 kg  Medications:  Patient on Xarelto '20mg'$  prior to admission (dx A.fib) last dose 10/16  Assessment: 56 yo F with PMH of HF, COPD, CAD, DM, HTN, and Afib (on Xarelto) presents to ED with RUQ /  Rt flank/ Rt side of abdomen involved. Pharmacy consulted to manage heparin infusion during inpatient stay. Potential surgical intervention needed. Last Xarelto dose 09/14/2022 per patient history. Pt in significant AKI so if Xarelto is restarted and renal function hasn't improved, would need to consider a dose reduction to 15 mg daily.  Baseline Labs: aPTT - 36; INR - 2.0 Hgb - 9.9; Plts - 225  Date Time aPTT/HL   Rate/Comment 10/18 1919 aPTT 150 / HL 0.28  aPTT Supra;  10/19 0242 aPTT 175 / HL 0.33  aPTT Supra;  10/19 1002 aPTT 145 / HL 0.32  aPTT Supra;  10/19 1807 aPTT 91   aPTT Thera. 10/20 0056 aPTT 72/ HL 0.10  aPTT Thera x 2;  10/20 0617 aPTT 56,    aPTT SUBthera;  1000 > 1150 un/h 10/20 1435 aPTT 128s   aPTT Supra; 1150>1100 un/hr 10/20 2319 aPTT 113, HL 0.22  aPTT Supra; 10/21 0846 aPTT 122, HL 0.15 10/21 1538 HL 0.30   Therapeutic x1  Goal of Therapy:  Heparin level 0.3-0.7 units/ml aPTT 66-102 seconds Monitor platelets by anticoagulation protocol: Yes   Plan:  Increase heparin to 1350 units/hr.  Recheck HL in 6 hrs  Heparin level and CBC daily while on heparin infusion.    Darrick Penna, PharmD Clinical Pharmacist 09/19/2022 9:09 PM

## 2022-09-19 NOTE — Progress Notes (Signed)
[facility]                                                                              Chelsea Ramsey, is a 56 y.o. female, DOB - 07-14-1966, OYD:[id number] Admit date - 09/15/2022    Outpatient Primary MD for the patient is Emelia Loron, NP  LOS - 3  days  Chief Complaint  Patient presents with   Renal Problem       Brief summary   Patient is 56 year old female with DM, pulm HTN, COPD, CHF, GERD, OSA was seen by PCP on day of admission for not feeling well and abd pain for about a month. Patient complained of pain in RUQ and right flank with nausea.   abnormal outpatient labs showing AKI.   In ED, her labwork confirmed AKI with Cr 3.54, total bilirubin of 9.6.  AST mildly elevated to 57, ALT 35, Alk Phos 80.  U/S was done which showed gallbladder wall thickening to 5 mm with pericholecystic fluid, suspicious for acute cholecystitis.  CBD per U/S was 3 mm.   Assessment & Plan    Principal Problem:   Right sided abdominal pain, transaminitis, hyperbilirubinemia - unclear etiology, suspected cholecystitis.  Surgery was consulted, recommended MRCP - MRCP showed biliary sludge, GB mild edematous. No biliary tract dilation to suggest obstruction.  No choledocholithiasis. 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 overload  -2D echo showed EF of 20 to 25% with grade 3 diastolic CHF, right ventricular pressure and volume overload (previous 2D echo in 10/2018 had shown EF of 50 to 55%) -Continue IV Lasix, 40 mg every 12 hours, still positive balance of 1.9 L -Weight 264.9 lbs on 10/17-> improving to 249.1 today -Follow  cardiology recommendations  Atrial fibrillation with RVR -Rate somewhat improving today 97 - 107 -Started on sotalol 80 mg daily, oral Cardizem 30 mg every 6 hours -Continue IV heparin    AKI (acute kidney injury) ([place]) -Baseline creatinine 0.78 on 01/02/2022, presented with creatinine of 3.54 on 09/15/2022 -Creatinine stable at 1.9    Iron deficiency anemia -Hemoglobin 9.8 on 11/02/2020, on admission 9.9 -Patient had endoscopy 06/29/2022 which had shown erythematous mucosa in the antrum, normal duodenal bulb and second portion of duodenum.  Colonoscopy had shown melanosis coli but otherwise normal.  -Hematology consulted,  low suspicion for valvular cause of hemolytic anemia, low suspicion for TTP/HUS -Elevated bilirubin both direct and indirect likely congestive hepatopathy. -H&H stable     HTN (hypertension) -Borderline BP, currently on Lasix, Cardizem, beta-blocker   Hypokalemia -Replaced    Tobacco dependence -Counseled on smoking cessation  Morbid obesity Estimated body mass index is 42.76 kg/m as calculated from the following:   Height as of this encounter: 5' 4" (1.626 m).   Weight as of this encounter: 113 kg.  Code Status: Full DVT Prophylaxis:  IV heparin drip   Level of Care: Level of care: Progressive Family Communication: Updated patient's husband at the bedside today  Disposition Plan:      Remains inpatient appropriate: Work-up in progress   Procedures:  MRCP 2D echo  Consultants:   General surgery GI Cardiology Hematology  Antimicrobials: None    Medications  allopurinol  100 mg Oral Daily   diltiazem  30 mg Oral Q6H   docusate sodium  100 mg Oral BID   furosemide  40 mg Intravenous Q12H   ipratropium-albuterol  3 mL Nebulization TID   midodrine  5 mg Oral TID WC   pantoprazole (PROTONIX) IV  40 mg Intravenous Q12H   sodium chloride flush  3 mL Intravenous Q12H   sodium chloride flush  3 mL Intravenous Q12H   sotalol  80 mg Oral Q24H       Subjective:   Chelsea Ramsey was seen and examined today.  Sleepy but easily arousable.  Heart rate better controlled, BP still borderline.  No fevers or chills, no abdominal pain nausea vomiting.     Objective:   Vitals:   09/19/22 0541 09/19/22 0742 09/19/22 0743 09/19/22 1207  BP:  97/68  97/61  Pulse:  100 99 (!) 110  Resp:  _0 Temp:  97.7 F (36.5 C)  97.8 F (36.6 C)  TempSrc:      SpO2:  98% 98% 99%  Weight: 113 kg     Height:        Intake/Output Summary (Last 24 hours) at 09/19/2022 1330 Last data filed at 09/18/2022 2011 Gross per 24 hour  Intake --  Output 500 ml  Net -500 ml     Wt Readings from Last 3 Encounters:  09/19/22 113 kg  06/29/22 120.2 kg  12/18/21 127.5 kg    Physical Exam General: Alert and oriented x 3, NAD Cardiovascular: Irregular Respiratory: Dec breath sounds at the bases Gastrointestinal: Soft, nontender, nondistended, NBS Ext: no pedal edema bilaterally Neuro: no new deficits Psych: Normal affect    Data Reviewed:  I have personally reviewed following labs    CBC Lab Results  Component Value Date   WBC 5.8 09/19/2022   RBC 4.10 09/19/2022   HGB 9.1 (L) 09/19/2022   HCT 31.8 (L) 09/19/2022   MCV 77.6 (L) 09/19/2022   MCH 22.2 (L) 09/19/2022   PLT 203 09/19/2022   MCHC 28.6 (L) 09/19/2022   RDW 22.8 (H) 09/19/2022   LYMPHSABS 1.1 09/17/2022   MONOABS 0.4 09/17/2022   EOSABS 0.1 09/17/2022   BASOSABS 0.0 71/24/5809     Last metabolic panel Lab Results  Component Value Date   NA 139 09/19/2022   K 3.2 (L) 09/19/2022   CL 100 09/19/2022   CO2 28 09/19/2022   BUN 36 (H) 09/19/2022   CREATININE 1.97 (H) 09/19/2022   GLUCOSE 79 09/19/2022   GFRNONAA 29 (L) 09/19/2022   GFRAA >60 01/31/2020   CALCIUM 8.3 (L) 09/19/2022   PHOS 4.1 12/03/2018   PROT 6.7 09/19/2022   ALBUMIN 2.7 (L) 09/19/2022   LABGLOB 2.9 10/10/2018   AGRATIO 1.5 10/10/2018   BILITOT 9.4 (H) 09/19/2022   ALKPHOS 72  09/19/2022   AST 50 (H) 09/19/2022   ALT 31 09/19/2022   ANIONGAP 11 09/19/2022    CBG (last 3)  No results for input(s): "GLUCAP" in the last 72 hours.    Coagulation Profile: Recent Labs  Lab 09/16/22 0942 09/17/22 0434  INR 2.0* 2.1*  Radiology Studies: I have personally reviewed the imaging studies  No results found.       M.D. [facility] 09/19/2022, 1:30 PM  Available via Epic secure chat 7am-7pm After 7 pm, please refer to night coverage provider listed on amion.    

## 2022-09-20 DIAGNOSIS — E876 Hypokalemia: Secondary | ICD-10-CM

## 2022-09-20 DIAGNOSIS — I5023 Acute on chronic systolic (congestive) heart failure: Secondary | ICD-10-CM | POA: Diagnosis not present

## 2022-09-20 LAB — COMPREHENSIVE METABOLIC PANEL
ALT: 30 U/L (ref 0–44)
AST: 46 U/L — ABNORMAL HIGH (ref 15–41)
Albumin: 2.6 g/dL — ABNORMAL LOW (ref 3.5–5.0)
Alkaline Phosphatase: 68 U/L (ref 38–126)
Anion gap: 8 (ref 5–15)
BUN: 37 mg/dL — ABNORMAL HIGH (ref 6–20)
CO2: 31 mmol/L (ref 22–32)
Calcium: 8.4 mg/dL — ABNORMAL LOW (ref 8.9–10.3)
Chloride: 100 mmol/L (ref 98–111)
Creatinine, Ser: 1.88 mg/dL — ABNORMAL HIGH (ref 0.44–1.00)
GFR, Estimated: 31 mL/min — ABNORMAL LOW (ref >60)
Glucose, Bld: 78 mg/dL (ref 70–99)
Potassium: 3.1 mmol/L — ABNORMAL LOW (ref 3.5–5.1)
Sodium: 139 mmol/L (ref 135–145)
Total Bilirubin: 9.1 mg/dL — ABNORMAL HIGH (ref 0.3–1.2)
Total Protein: 6.6 g/dL (ref 6.5–8.1)

## 2022-09-20 LAB — CBC
HCT: 31.4 % — ABNORMAL LOW (ref 36.0–46.0)
Hemoglobin: 8.9 g/dL — ABNORMAL LOW (ref 12.0–15.0)
MCH: 22.2 pg — ABNORMAL LOW (ref 26.0–34.0)
MCHC: 28.3 g/dL — ABNORMAL LOW (ref 30.0–36.0)
MCV: 78.3 fL — ABNORMAL LOW (ref 80.0–100.0)
Platelets: 174 10*3/uL (ref 150–400)
RBC: 4.01 MIL/uL (ref 3.87–5.11)
RDW: 22.8 % — ABNORMAL HIGH (ref 11.5–15.5)
WBC: 5.5 10*3/uL (ref 4.0–10.5)
nRBC: 0.7 % — ABNORMAL HIGH (ref 0.0–0.2)

## 2022-09-20 LAB — HEPARIN LEVEL (UNFRACTIONATED): Heparin Unfractionated: 0.36 IU/mL (ref 0.30–0.70)

## 2022-09-20 MED ORDER — OXYCODONE HCL 5 MG PO TABS
10.0000 mg | ORAL_TABLET | ORAL | Status: DC | PRN
  Administered 2022-09-20 – 2022-09-21 (×2): 10 mg via ORAL
  Filled 2022-09-20 (×2): qty 2

## 2022-09-20 MED ORDER — RIVAROXABAN 15 MG PO TABS
15.0000 mg | ORAL_TABLET | Freq: Every day | ORAL | Status: DC
  Filled 2022-09-20: qty 1

## 2022-09-20 MED ORDER — RISAQUAD PO CAPS
1.0000 | ORAL_CAPSULE | Freq: Every day | ORAL | Status: DC
  Administered 2022-09-20 – 2022-10-01 (×11): 1 via ORAL
  Filled 2022-09-20 (×11): qty 1

## 2022-09-20 MED ORDER — GABAPENTIN 600 MG PO TABS
300.0000 mg | ORAL_TABLET | Freq: Two times a day (BID) | ORAL | Status: DC
  Administered 2022-09-20 – 2022-10-01 (×21): 300 mg via ORAL
  Filled 2022-09-20 (×23): qty 1

## 2022-09-20 MED ORDER — RIVAROXABAN 20 MG PO TABS
20.0000 mg | ORAL_TABLET | Freq: Every day | ORAL | Status: DC
  Administered 2022-09-20: 20 mg via ORAL
  Filled 2022-09-20: qty 1

## 2022-09-20 MED ORDER — RIVAROXABAN 20 MG PO TABS: 20.0000 mg | ORAL_TABLET | Freq: Every day | ORAL | Status: DC

## 2022-09-20 NOTE — Assessment & Plan Note (Signed)
Chronic respiratory failure. Patient is on 3 to 4 L of oxygen at home.  Currently at baseline. -Continue to monitor

## 2022-09-20 NOTE — Assessment & Plan Note (Signed)
Seems to have an history of chronic pain syndrome and fibromyalgia. She was on oxycodone and Neurontin at home. -Restarting oxycodone and Neurontin

## 2022-09-20 NOTE — Assessment & Plan Note (Signed)
Estimated body mass index is 42.76 kg/m as calculated from the following:   Height as of this encounter: '5\' 4"'$  (1.626 m).   Weight as of this encounter: 113 kg.   -This will complicate overall prognosis

## 2022-09-20 NOTE — Assessment & Plan Note (Signed)
Patient was found to have new onset HFrEF with EF of 20 to 25% and grade 3 diastolic dysfunction.  Had low normal EF on prior echocardiogram done in 2020. Cardiology was consulted-no clear recommendations about ischemic work-up as she will get benefit from.  She is being diuresed with no recorded output. -Continue with IV Lasix 40 mg twice daily. -Need strict intake and output -Daily weight and CMP -Continue to monitor

## 2022-09-20 NOTE — Progress Notes (Signed)
SUBJECTIVE: Chelsea Ramsey is a 56 y.o. female with a past medical history of anxiety, CHF, COPD on 4 L chronically, CVA, diabetes, gastric reflux, hypertension, who presented to the emergency department on 09/15/22 for abnormal lab work, AKI. Patient reports persistent abdominal pain that has been ongoing for months, across the entire abdomen, worse in the upper abdomen. Denies chest pain. Shortness of breath unchanged. Feels poorly overall.    Vitals:   09/20/22 0517 09/20/22 0726 09/20/22 0822 09/20/22 1149  BP: (!) 95/53 101/86  95/70  Pulse: 100 (!) 102 (!) 103 (!) 102  Resp: '20 18 18 18  '$ Temp: 97.9 F (36.6 C) (!) 97.5 F (36.4 C)  97.6 F (36.4 C)  TempSrc: Oral Oral  Oral  SpO2: 100% 99% 97% 100%  Weight:      Height:        Intake/Output Summary (Last 24 hours) at 09/20/2022 1352 Last data filed at 09/20/2022 1351 Gross per 24 hour  Intake 1109.72 ml  Output 750 ml  Net 359.72 ml    LABS: Basic Metabolic Panel: Recent Labs    09/18/22 0749 09/19/22 0447 09/20/22 0317  NA 140 139 139  K 2.9* 3.2* 3.1*  CL 102 100 100  CO2 '26 28 31  '$ GLUCOSE 87 79 78  BUN 39* 36* 37*  CREATININE 1.99* 1.97* 1.88*  CALCIUM 8.3* 8.3* 8.4*  MG 1.6*  --   --    Liver Function Tests: Recent Labs    09/19/22 0447 09/20/22 0317  AST 50* 46*  ALT 31 30  ALKPHOS 72 68  BILITOT 9.4* 9.1*  PROT 6.7 6.6  ALBUMIN 2.7* 2.6*   No results for input(s): "LIPASE", "AMYLASE" in the last 72 hours. CBC: Recent Labs    09/19/22 0447 09/20/22 0317  WBC 5.8 5.5  HGB 9.1* 8.9*  HCT 31.8* 31.4*  MCV 77.6* 78.3*  PLT 203 174   Cardiac Enzymes: No results for input(s): "CKTOTAL", "CKMB", "CKMBINDEX", "TROPONINI" in the last 72 hours. BNP: Invalid input(s): "POCBNP" D-Dimer: No results for input(s): "DDIMER" in the last 72 hours. Hemoglobin A1C: No results for input(s): "HGBA1C" in the last 72 hours. Fasting Lipid Panel: No results for input(s): "CHOL", "HDL",  "LDLCALC", "TRIG", "CHOLHDL", "LDLDIRECT" in the last 72 hours. Thyroid Function Tests: No results for input(s): "TSH", "T4TOTAL", "T3FREE", "THYROIDAB" in the last 72 hours.  Invalid input(s): "FREET3" Anemia Panel: No results for input(s): "VITAMINB12", "FOLATE", "FERRITIN", "TIBC", "IRON", "RETICCTPCT" in the last 72 hours.   PHYSICAL EXAM General: Well developed, well nourished, in no acute distress HEENT:  Normocephalic and atramatic Neck:  No JVD.  Lungs: Clear bilaterally to auscultation and percussion. Heart: HRRR . Normal S1 and S2 without gallops or murmurs.  Abdomen: Bowel sounds are positive, abdomen soft and non-tender  Msk:  Back normal, normal gait. Normal strength and tone for age. Extremities: No clubbing, cyanosis or edema.   Neuro: Alert and oriented X 3. Psych:  Good affect, responds appropriately  TELEMETRY: atrial fibrillation, HR 105 bpm  ASSESSMENT AND PLAN: Patient resting in bed, in no acute distress. Patient complains of feeling poorly, short of breath. States pain medication she is receiving is not helping her pain. Midodrine increased yesterday.  B/p remains soft. Will continue to monitor.   Principal Problem:   Acute on chronic HFrEF (heart failure with reduced ejection fraction) (HCC) Active Problems:   A-fib (HCC)   HTN (hypertension)   Chronic low back pain (Primary Area of Pain) (Bilateral) w/ sciatica (  Left)   Class 3 severe obesity with body mass index (BMI) of 45.0 to 49.9 in adult (HCC)   Moderate to severe pulmonary hypertension (HCC)   Tobacco dependence   History of cocaine use   Iron deficiency anemia   Abdominal pain   AKI (acute kidney injury) ([place])   Elevated LFTs   Hypokalemia    Colston Pyle, FNP-C 09/20/2022 1:52 PM

## 2022-09-20 NOTE — Assessment & Plan Note (Signed)
Replace potassium and monitor

## 2022-09-20 NOTE — Plan of Care (Signed)
Patient remains in PCU. Supplemental O2 at 2 L/min via [place].   Problem: Education: Goal: Knowledge of General Education information will improve Description: Including pain rating scale, medication(s)/side effects and non-pharmacologic comfort measures Outcome: Progressing   Problem: Health Behavior/Discharge Planning: Goal: Ability to manage health-related needs will improve Outcome: Progressing   Problem: Clinical Measurements: Goal: Ability to maintain clinical measurements within normal limits will improve Outcome: Progressing Goal: Will remain free from infection Outcome: Progressing Goal: Diagnostic test results will improve Outcome: Progressing Goal: Respiratory complications will improve Outcome: Progressing Goal: Cardiovascular complication will be avoided Outcome: Progressing   Problem: Activity: Goal: Risk for activity intolerance will decrease Outcome: Progressing   Problem: Nutrition: Goal: Adequate nutrition will be maintained Outcome: Progressing   Problem: Coping: Goal: Level of anxiety will decrease Outcome: Progressing   Problem: Elimination: Goal: Will not experience complications related to bowel motility Outcome: Progressing Goal: Will not experience complications related to urinary retention Outcome: Progressing   Problem: Pain Managment: Goal: General experience of comfort will improve Outcome: Progressing   Problem: Safety: Goal: Ability to remain free from injury will improve Outcome: Progressing   Problem: Skin Integrity: Goal: Risk for impaired skin integrity will decrease Outcome: Progressing

## 2022-09-20 NOTE — Assessment & Plan Note (Signed)
Patient presented with creatinine of 3.54 with baseline around 1. Slowly improving.  Concern of cardiorenal -Monitor renal function while she is being diuresed -Avoid renal toxins

## 2022-09-20 NOTE — Progress Notes (Signed)
Progress Note   Patient: Chelsea Ramsey GYI:[id number] DOB: [birth year] DOA: 09/15/2022     4 DOS: the patient was seen and examined on 09/20/2022   Brief hospital course: Taken from prior notes.  Patient is 56 year old female with DM, pulm HTN, COPD, CHF, GERD, OSA was seen by PCP on day of admission for not feeling well and abd pain for about a month. Patient complained of pain in RUQ and right flank with nausea.   abnormal outpatient labs showing AKI.   In ED, her labwork confirmed AKI with Cr 3.54, total bilirubin of 9.6.  AST mildly elevated to 57, ALT 35, Alk Phos 80.  U/S was done which showed gallbladder wall thickening to 5 mm with pericholecystic fluid, suspicious for acute cholecystitis.  CBD per U/S was 3 mm.   Surgery was consulted, recommended MRCP - MRCP showed biliary sludge, GB mild edematous. No biliary tract dilation to suggest obstruction.  No choledocholithiasis. 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  Also found to have acute systolic on chronic diastolic heart failure.  Echocardiogram shows EF of 20 to 25% with grade 3 diastolic dysfunction, right ventricular pressure and volume overload.  Prior echo done in December 2019 with a EF of 50 to 55%.  She was started on IV diuresis.  As blood pressure remained soft which interfere with other medical management, she was started on midodrine.  Remained in A-fib with intermittently going in RVR.  Missed Cardizem doses yesterday due to softer blood pressure.  Patient was on heparin infusion for the past 5 days, do not see any plan for cardiac cath by cardiology.  No clear recommendations. CMP remained with elevated T. bili at 9.1, she continued to have  upper abdominal pain. Concern of cardiohepatic with hepatic congestion secondary to heart failure. Secure chat with Dr. Humphrey Rolls and we will discontinue heparin infusion today and restart her home Xarelto.  Patient will get benefit from ischemic work-up with this new HFrEF. Norco switched with oxycodone immediate release to see if that will better serve her pain. No recorded urinary output as she was receiving IV diuresis-message sent to nursing staff. Potassium at 3.1 which is being repleted.  Renal functions continue to improve slowly.   Assessment and Plan: * Acute on chronic HFrEF (heart failure with reduced ejection fraction) ([place]) Patient was found to have new onset HFrEF with EF of 20 to 25% and grade 3 diastolic dysfunction.  Had low normal EF on prior echocardiogram done in 2020. Cardiology was consulted-no clear recommendations about ischemic work-up as she will get benefit from.  She is being diuresed with no recorded output. -Continue with IV Lasix 40 mg twice daily. -Need strict intake and output -Daily weight and CMP -Continue to monitor  Abdominal pain Pt c/o right sided abd pain for one month.  Work-up so far with MRCP shows biliary sludge, mildly edematous gallbladder, no biliary tract dilation to suggest obstruction.  No choledocholithiasis. 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  T. bili remained stable around 9.1, -Switch Norco with immediate release oxycodone.    Elevated LFTs Most likely secondary to congestive hepatopathy, also concern of Nash liver disease.  Liver enzymes improved with stable T. bili at 9.1. GI and general surgery signed off. Patient  is being diuresed. -Continue to monitor  AKI (acute kidney injury) [facility]) Patient presented with creatinine of 3.54 with baseline around 1. Slowly improving.  Concern of cardiorenal -Monitor renal function while she is being diuresed -Avoid renal toxins  HTN  (hypertension) Blood pressure remains soft which interfere with necessary medical management which include diuretics and Cardizem for rate control. She was started on midodrine with some improvement. -Continue with midodrine -Monitor closely when she is being diuresed with Lasix and getting Cardizem, amiodarone allergies were noted.    Iron deficiency anemia Pt has h/o IDA / is currently anemic and is on xarelto for a/fib. In afib rvr on ekg.  Pt being followed by Dr.vanga for her IDA and Eval for GI loss. GI eval showed: Endoscopy Shows Studies Complaint Capsule History Speak, Normal Small Bowel Transit, Scattered Small Bowel Lymphangiectasia's, No Source of Iron Deficiency Anemia Identified - 08/18/2022. Endoscopy shows erythematous  mucosa in the antrum, normal duodenal bulb and second portion of duodenum, colonoscopy showed melanosis coli but was otherwise normal 06/29/2022.  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 Currently hemoglobin stable. Can-continue to monitor -Continue with iron supplement-restarting home meds.  A-fib (HCC) Remained in A-fib with going in and out of RVR. -Continue with Cardizem, beta-blocker and Xarelto   Tobacco dependence -Nicotine patch.   History of cocaine use UDS done on 09/16/2022 were positive for benzodiazepines, opioids and tricyclic.   Chronic low back pain (Primary Area of Pain) (Bilateral) w/ sciatica (Left) Seems to have an history of chronic pain syndrome and fibromyalgia. She was on oxycodone and Neurontin at home. -Restarting oxycodone and Neurontin  Class 3 severe obesity with body mass index (BMI) of 45.0 to 49.9 in adult [facility]) Estimated body mass index is 42.76 kg/m as calculated from the following:   Height as of this encounter: 5' 4"  (1.626 m).   Weight as of this encounter: 113 kg.   -This will complicate overall prognosis  Hypokalemia Replace  potassium and monitor  Moderate to severe pulmonary hypertension (HCC) Chronic respiratory failure. Patient is on 3 to 4 L of oxygen at home.  Currently at baseline. -Continue to monitor  Subjective: Patient was seen and examined today.  She was complaining of upper abdominal and lower back pain.  Stating that she was feeling nauseated intermittently.  She was able to take some juices this morning.  Had a bowel movement yesterday. No chest pain or shortness of breath.  Baseline oxygen use of 3 to 4 L.  Physical Exam: Vitals:   09/20/22 0517 09/20/22 0726 09/20/22 0822 09/20/22 1149  BP: (!) 95/53 101/86  95/70  Pulse: 100 (!) 102 (!) 103 (!) 102  Resp: 20 18 18 18   Temp: 97.9 F (36.6 C) (!) 97.5 F (36.4 C)  97.6 F (36.4 C)  TempSrc: Oral Oral  Oral  SpO2: 100% 99% 97% 100%  Weight:      Height:       General.  Chronically ill appearing, obese lady, in no acute distress. Pulmonary.  Lungs clear bilaterally, normal respiratory effort. CV.  Regular rate and rhythm, no JVD, rub or murmur. Abdomen.  Soft, nontender, nondistended, BS positive. CNS.  Alert and oriented .  No focal neurologic deficit. Extremities.  1+ LE edema, no cyanosis, pulses intact and symmetrical. Psychiatry.  Judgment and insight appears normal.  Data Reviewed: Prior data reviewed  Family Communication: Discussed with husband at bedside  Disposition: Status is: Inpatient  Remains inpatient appropriate because: Severity of illness   Planned Discharge Destination: Home with Home Health  DVT prophylaxis.  Xarelto Time spent: 52 minutes  This record has been created using Systems analyst. Errors have been sought and corrected,but may not always be located. Such creation errors do not reflect on the standard of care.  Author: Lorella Nimrod, MD 09/20/2022 1:33 PM  For on call review [email or web address].

## 2022-09-20 NOTE — Consult Note (Signed)
ANTICOAGULATION CONSULT NOTE  Pharmacy Consult for Heparin infusion Indication: atrial fibrillation  Allergies  Allergen Reactions   Amiodarone Nausea And Vomiting   Aspirin Swelling   Flexeril [Cyclobenzaprine] Swelling   Trazamine [Trazodone & Diet Manage Prod] Nausea And Vomiting   Codeine Rash   Tramadol Rash    Patient Measurements: Height: '5\' 4"'$  (162.6 cm) Weight: 113 kg (249 lb 1.9 oz) IBW/kg (Calculated) : 54.7 Heparin Dosing Weight: 83.9 kg  Vital Signs: Temp: 98 F (36.7 C) (10/21 2334) Temp Source: Oral (10/21 2334) BP: 91/71 (10/21 2334) Pulse Rate: 87 (10/21 2334)  Labs: Recent Labs    09/17/22 0434 09/17/22 1002 09/18/22 0617 09/18/22 0749 09/18/22 1435 09/18/22 2319 09/19/22 0447 09/19/22 0758 09/19/22 0846 09/19/22 1538 09/20/22 0317  HGB 8.8*  8.8*  --  8.9*  --   --   --  9.1*  --   --   --  8.9*  HCT 32.1*  31.3*  --  30.9*  --   --   --  31.8*  --   --   --  31.4*  PLT 228  225  --  190  --   --   --  203  --   --   --  174  APTT  --    < > 56*  --  128* 113*  --  122*  --   --   --   LABPROT 23.2*  --   --   --   --   --   --   --   --   --   --   INR 2.1*  --   --   --   --   --   --   --   --   --   --   HEPARINUNFRC  --    < >  --   --  0.23* 0.22*  --   --  0.15* 0.30 0.36  CREATININE 2.47*  --   --  1.99*  --   --  1.97*  --   --   --   --    < > = values in this interval not displayed.     Estimated Creatinine Clearance: 39.7 mL/min (A) (by C-G formula based on SCr of 1.97 mg/dL (H)).   Medical History: Past Medical History:  Diagnosis Date   Acute drug-induced gout of left foot 03/01/2018   Last Assessment & Plan:  Likely at least partially brought on by diuresis.  Needs to continue to diurese  Will push hydration Stop allopurinol given initiation during acute flare may worsen this, re-broach this when asymptomatic Avoid nsaids given stomach pain Trial colchicine Add acetaminophen Ice, elevate, rest   Allergy    seasonal    Anxiety    Arthritis    Right Knee   Asthma    CHF (congestive heart failure) (HCC)    COPD (chronic obstructive pulmonary disease) (HCC)    Coronary artery disease    Leaky heart valve   Diabetes mellitus without complication (HCC)    Dysrhythmia    Fibromyalgia    GERD (gastroesophageal reflux disease)    Hypertension    Pneumonia    PUD (peptic ulcer disease)    Pulmonary HTN (HCC)    Rheumatic fever/heart disease    Sleep apnea   Heparin Dosing Weight: 83.9 kg  Medications:  Patient on Xarelto '20mg'$  prior to admission (dx A.fib) last dose 10/16  Assessment: 56 yo F  with PMH of HF, COPD, CAD, DM, HTN, and Afib (on Xarelto) presents to ED with RUQ / Rt flank/ Rt side of abdomen involved. Pharmacy consulted to manage heparin infusion during inpatient stay. Potential surgical intervention needed. Last Xarelto dose 09/14/2022 per patient history. Pt in significant AKI so if Xarelto is restarted and renal function hasn't improved, would need to consider a dose reduction to 15 mg daily.  Baseline Labs: aPTT - 36; INR - 2.0 Hgb - 9.9; Plts - 225  Date Time aPTT/HL   Rate/Comment 10/18 1919 aPTT 150 / HL 0.28  aPTT Supra;  10/19 0242 aPTT 175 / HL 0.33  aPTT Supra;  10/19 1002 aPTT 145 / HL 0.32  aPTT Supra;  10/19 1807 aPTT 91   aPTT Thera. 10/20 0056 aPTT 72/ HL 0.10  aPTT Thera x 2;  10/20 0617 aPTT 56,    aPTT SUBthera;  1000 > 1150 un/h 10/20 1435 aPTT 128s   aPTT Supra; 1150>1100 un/hr 10/20 2319 aPTT 113, HL 0.22  aPTT Supra; 10/21 0846 aPTT 122, HL 0.15 10/21 1538 HL 0.30   Therapeutic x1 10/22 0317 HL 0.36, therapeutic x 2  Goal of Therapy:  Heparin level 0.3-0.7 units/ml aPTT 66-102 seconds Monitor platelets by anticoagulation protocol: Yes   Plan:  Continue heparin infusion at 1350 units/hr.  Recheck HL w/ AM labs daily while therapeutic CBC daily while on heparin infusion.   Renda Rolls, PharmD, [facility] 09/20/2022 4:04 AM

## 2022-09-21 DIAGNOSIS — I5023 Acute on chronic systolic (congestive) heart failure: Secondary | ICD-10-CM | POA: Diagnosis not present

## 2022-09-21 LAB — CBC
HCT: 32.1 % — ABNORMAL LOW (ref 36.0–46.0)
Hemoglobin: 9 g/dL — ABNORMAL LOW (ref 12.0–15.0)
MCH: 21.7 pg — ABNORMAL LOW (ref 26.0–34.0)
MCHC: 28 g/dL — ABNORMAL LOW (ref 30.0–36.0)
MCV: 77.5 fL — ABNORMAL LOW (ref 80.0–100.0)
Platelets: 156 10*3/uL (ref 150–400)
RBC: 4.14 MIL/uL (ref 3.87–5.11)
RDW: 22.7 % — ABNORMAL HIGH (ref 11.5–15.5)
WBC: 5.4 10*3/uL (ref 4.0–10.5)
nRBC: 0.4 % — ABNORMAL HIGH (ref 0.0–0.2)

## 2022-09-21 LAB — QUANTIFERON-TB GOLD PLUS (RQFGPL)
QuantiFERON Mitogen Value: 5.57 IU/mL
QuantiFERON Nil Value: 0.1 IU/mL
QuantiFERON TB1 Ag Value: 5.08 IU/mL
QuantiFERON TB2 Ag Value: 3.81 IU/mL

## 2022-09-21 LAB — BLOOD GAS, ARTERIAL
Acid-Base Excess: 10.4 mmol/L — ABNORMAL HIGH (ref 0.0–2.0)
Bicarbonate: 37.4 mmol/L — ABNORMAL HIGH (ref 20.0–28.0)
FIO2: 0.28 %
O2 Saturation: 97.5 %
Patient temperature: 37
pCO2 arterial: 59 mmHg — ABNORMAL HIGH (ref 32–48)
pH, Arterial: 7.41 (ref 7.35–7.45)
pO2, Arterial: 80 mmHg — ABNORMAL LOW (ref 83–108)

## 2022-09-21 LAB — COMPREHENSIVE METABOLIC PANEL
ALT: 30 U/L (ref 0–44)
AST: 53 U/L — ABNORMAL HIGH (ref 15–41)
Albumin: 2.6 g/dL — ABNORMAL LOW (ref 3.5–5.0)
Alkaline Phosphatase: 68 U/L (ref 38–126)
Anion gap: 11 (ref 5–15)
BUN: 35 mg/dL — ABNORMAL HIGH (ref 6–20)
CO2: 28 mmol/L (ref 22–32)
Calcium: 8.6 mg/dL — ABNORMAL LOW (ref 8.9–10.3)
Chloride: 98 mmol/L (ref 98–111)
Creatinine, Ser: 1.89 mg/dL — ABNORMAL HIGH (ref 0.44–1.00)
GFR, Estimated: 31 mL/min — ABNORMAL LOW (ref >60)
Glucose, Bld: 75 mg/dL (ref 70–99)
Potassium: 3.7 mmol/L (ref 3.5–5.1)
Sodium: 137 mmol/L (ref 135–145)
Total Bilirubin: 8.5 mg/dL — ABNORMAL HIGH (ref 0.3–1.2)
Total Protein: 6.5 g/dL (ref 6.5–8.1)

## 2022-09-21 LAB — HEPARIN LEVEL (UNFRACTIONATED): Heparin Unfractionated: >1.1 IU/mL — ABNORMAL HIGH (ref 0.30–0.70)

## 2022-09-21 LAB — QUANTIFERON-TB GOLD PLUS: QuantiFERON-TB Gold Plus: POSITIVE — AB

## 2022-09-21 LAB — AMMONIA: Ammonia: 135 umol/L — ABNORMAL HIGH (ref 9–35)

## 2022-09-21 LAB — MAGNESIUM: Magnesium: 1.5 mg/dL — ABNORMAL LOW (ref 1.7–2.4)

## 2022-09-21 MED ORDER — RIVAROXABAN 15 MG PO TABS
15.0000 mg | ORAL_TABLET | Freq: Every day | ORAL | Status: DC
  Filled 2022-09-21: qty 1

## 2022-09-21 MED ORDER — MAGNESIUM SULFATE 2 GM/50ML IV SOLN
2.0000 g | Freq: Once | INTRAVENOUS | Status: AC
  Administered 2022-09-21: 2 g via INTRAVENOUS
  Filled 2022-09-21: qty 50

## 2022-09-21 MED ORDER — POTASSIUM CHLORIDE 20 MEQ PO PACK
40.0000 meq | PACK | Freq: Two times a day (BID) | ORAL | Status: DC
  Administered 2022-09-22 (×2): 40 meq via ORAL
  Filled 2022-09-21 (×2): qty 2

## 2022-09-21 MED ORDER — METOPROLOL TARTRATE 25 MG PO TABS
25.0000 mg | ORAL_TABLET | Freq: Three times a day (TID) | ORAL | Status: DC
  Administered 2022-09-21 – 2022-09-25 (×10): 25 mg via ORAL
  Filled 2022-09-21 (×11): qty 1

## 2022-09-21 MED ORDER — LACTULOSE 10 GM/15ML PO SOLN
30.0000 g | Freq: Three times a day (TID) | ORAL | Status: DC
  Administered 2022-09-21 – 2022-09-22 (×2): 30 g via ORAL
  Filled 2022-09-21 (×2): qty 60

## 2022-09-21 MED ORDER — POTASSIUM CHLORIDE CRYS ER 20 MEQ PO TBCR
40.0000 meq | EXTENDED_RELEASE_TABLET | Freq: Two times a day (BID) | ORAL | Status: DC
  Filled 2022-09-21: qty 2

## 2022-09-21 MED ORDER — RIVAROXABAN 20 MG PO TABS
20.0000 mg | ORAL_TABLET | Freq: Every day | ORAL | Status: DC
  Administered 2022-09-21: 20 mg via ORAL
  Filled 2022-09-21: qty 1

## 2022-09-21 MED ORDER — MAGNESIUM SULFATE 4 GM/100ML IV SOLN
4.0000 g | Freq: Once | INTRAVENOUS | Status: DC
  Filled 2022-09-21: qty 100

## 2022-09-21 NOTE — Assessment & Plan Note (Addendum)
Patient presented with creatinine of 3.54 with baseline around 1. Slowly improving, creatinine at 1.26 today

## 2022-09-21 NOTE — Progress Notes (Signed)
       CROSS COVER NOTE  NAME: Chelsea Ramsey MRN: [id number] DOB : [birth year] ATTENDING PHYSICIAN: Lorella Nimrod, MD      Date of Service   09/21/2022   HPI/Events of Note   Paged to bedside for report of patient vomiting blood. On bedside assessment patient appears to have bloody sputum with small clot on chest and gown. Patient confirms she coughed up the blood and did not vomit. RN also reports M(r)s Ramsey had an episode of epistaxis earlier tonight that self resolved.    M(r)s Ramsey is a 56 year old female with DM, pulm HTN, COPD, CHF, GERD, OSA was seen by PCP on day of admission for not feeling well and abd pain for about a month. Patient complained of pain in RUQ and right flank with nausea. She is on Xarelto for AFIB CVA risk stratification.  Interventions   Assessment/Plan:  Hemoptysis w Small Clot  Consider holding Xarelto CBC, Coags Afrin      This document was prepared using Dragon voice recognition software and may include unintentional dictation errors.  Neomia Glass DNP, MBA, FNP-BC Nurse Practitioner [facility] Pager [phone]

## 2022-09-21 NOTE — Progress Notes (Signed)
Physical Therapy Treatment Patient Details Name: Chelsea Ramsey MRN: [id number] DOB: 1966/02/28 Today's Date: 09/21/2022   History of Present Illness Pt is a 56 yo female seen by PCP secondary to not feeling well with abd pain for about a month. MD assessment includes: Right sided abdominal pain, transaminitis, hyperbilirubinemia, AKI, and iron deficiency anemia.    PT Comments    Pt lethargic during session but easily to arouse. Even when awake pt had difficulty following commands and communicating verbally.  Pt required significantly more assistance with bed mobility this session compared to last and while sitting at the EOB required frequent physical assist to prevent posterior LOB.  Sitting balance improved minimally with extensive time in sitting and with anterior weight shifting activities but she remained unsafe to attempt to stand.  Nursing present during last part of session and aware of pt's decline in status and discharge recommendation change to SNF, MD also notified.  Pt will benefit from PT services in a SNF setting upon discharge to safely address deficits listed in patient problem list for decreased caregiver assistance and eventual return to PLOF.   Recommendations for follow up therapy are one component of a multi-disciplinary discharge planning process, led by the attending physician.  Recommendations may be updated based on patient status, additional functional criteria and insurance authorization.  Follow Up Recommendations  Skilled nursing-short term rehab (<3 hours/day) Can patient physically be transported by private vehicle: No   Assistance Recommended at Discharge Frequent or constant Supervision/Assistance  Patient can return home with the following Two people to help with walking and/or transfers;Two people to help with bathing/dressing/bathroom;Assistance with cooking/housework;Help with stairs or ramp for entrance;Assist for transportation   Equipment  Recommendations  Other (comment) (TBD at next venue of care)    Recommendations for Other Services       Precautions / Restrictions Precautions Precautions: Fall Restrictions Weight Bearing Restrictions: No Other Position/Activity Restrictions: Watch HR     Mobility  Bed Mobility Overal bed mobility: Needs Assistance Bed Mobility: Rolling, Supine to Sit, Sit to Supine Rolling: Max assist   Supine to sit: Max assist, HOB elevated Sit to supine: Max assist, +2 for physical assistance   General bed mobility comments: Max A for BLE and trunk control    Transfers                   General transfer comment: Unable/unsafe to attempt; pt unable to maintain static sitting position at the EOB secondary to posterior instability    Ambulation/Gait                   Stairs             Wheelchair Mobility    Modified Rankin (Stroke Patients Only)       Balance Overall balance assessment: Needs assistance   Sitting balance-Leahy Scale: Poor Sitting balance - Comments: Frequent min to mod A to prevent posterior LOB Postural control: Posterior lean                                  Cognition Arousal/Alertness: Lethargic Behavior During Therapy: Flat affect Overall Cognitive Status: No family/caregiver present to determine baseline cognitive functioning                                 General Comments: Very lethargic this session with  difficulty formulating words and following commands even when awake        Exercises Total Joint Exercises Hip ABduction/ADduction: AAROM, Strengthening, Both, 5 reps Straight Leg Raises: AAROM, Strengthening, Both, 5 reps Long Arc Quad: AROM, Strengthening, Both, 5 reps, 10 reps Knee Flexion: AROM, Strengthening, Both, 5 reps, 10 reps Other Exercises Other Exercises: Anterior weight shifting activities in sitting to address posterior instability Other Exercises: Rolling L/R in bed for  bed mobility training and core therex    General Comments        Pertinent Vitals/Pain Pain Assessment Pain Assessment: No/denies pain    Home Living                          Prior Function            PT Goals (current goals can now be found in the care plan section) Progress towards PT goals: Not progressing toward goals - comment (decreased functional mobility, balance, and level of alertness this session)    Frequency    Min 2X/week      PT Plan Discharge plan needs to be updated    Co-evaluation              AM-PAC PT "6 Clicks" Mobility   Outcome Measure  Help needed turning from your back to your side while in a flat bed without using bedrails?: A Lot Help needed moving from lying on your back to sitting on the side of a flat bed without using bedrails?: Total Help needed moving to and from a bed to a chair (including a wheelchair)?: Total Help needed standing up from a chair using your arms (e.g., wheelchair or bedside chair)?: Total Help needed to walk in hospital room?: Total Help needed climbing 3-5 steps with a railing? : Total 6 Click Score: 7    End of Session Equipment Utilized During Treatment: Oxygen Activity Tolerance: Patient limited by lethargy Patient left: in bed;with call bell/phone within reach;with bed alarm set;with nursing/sitter in room Nurse Communication: Mobility status;Other (comment) (Nursing in room and aware of pt's decline in function, balance, and cognition/alertness) PT Visit Diagnosis: Muscle weakness (generalized) (M62.81);Difficulty in walking, not elsewhere classified (R26.2);Unsteadiness on feet (R26.81)     Time: 5176-1607 PT Time Calculation (min) (ACUTE ONLY): 23 min  Charges:  $Therapeutic Exercise: 8-22 mins $Therapeutic Activity: 8-22 mins                    D. Scott Tabor Bartram PT, DPT 09/21/22, 5:23 PM

## 2022-09-21 NOTE — Progress Notes (Signed)
[place] NOTE       Patient ID: Jenniferlynn Saad MRN: [id number] DOB/AGE: [id number] 55 y.o.  Admit date: 09/15/2022 Referring Physician Dr. Estill Cotta Primary Physician Dr. Gracy Racer Primary Cardiologist Dr. Saralyn Pilar Reason for Consultation acute on chronic HFrEF, paroxysmal atrial fibrillation with RVR  HPI: Jamala C. 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  Labs on admission showed acute renal failure, transaminitis, and hyperbilirubinemia, ultimately attributed to volume overload from her heart failure exacerbation.  Her echo revealed a newly reduced ejection fraction as above and her hospital course has been complicated by atrial fibrillation with RVR.  Interval history: -Continues to have abdominal discomfort, nausea she says has been constant and unchanged since admission -She feels very tired, received 10 mg of oxycodone about an hour prior to my visit -Still has significant peripheral edema and tenderness around her ankles where her socks are, feels occasional heart racing feels short of breath, on her baseline oxygen requirement. -She remains in atrial flutter with 4:1 conduction, rate controlled in the 80s while on sotalol and Xarelto   Past Medical History:  Diagnosis Date   Acute drug-induced gout of left foot 03/01/2018   Last Assessment & Plan:  Likely at least partially brought on by diuresis.  Needs to continue to diurese  Will push hydration Stop allopurinol given initiation during acute flare may worsen this, re-broach this when asymptomatic Avoid nsaids given stomach  pain Trial colchicine Add acetaminophen Ice, elevate, rest   Allergy    seasonal   Anxiety    Arthritis    Right Knee   Asthma    CHF (congestive heart failure) (HCC)    COPD (chronic obstructive pulmonary disease) (HCC)    Coronary artery disease    Leaky heart valve   Diabetes mellitus without complication (HCC)    Dysrhythmia    Fibromyalgia    GERD (gastroesophageal reflux disease)    Hypertension    Pneumonia    PUD (peptic ulcer disease)    Pulmonary HTN (HCC)    Rheumatic fever/heart disease    Sleep apnea     Past Surgical History:  Procedure Laterality Date   ADENOIDECTOMY     COLONOSCOPY WITH PROPOFOL N/A 11/06/2019   Procedure: COLONOSCOPY WITH PROPOFOL;  Surgeon: Virgel Manifold, MD;  Location: [facility] ENDOSCOPY;  Service: Endoscopy;  Laterality: N/A;  2 day prep    COLONOSCOPY WITH PROPOFOL N/A 06/29/2022   Procedure: COLONOSCOPY WITH PROPOFOL;  Surgeon: Lin Landsman, MD;  Location: [facility] ENDOSCOPY;  Service: Gastroenterology;  Laterality: N/A;   CORONARY ARTERY BYPASS GRAFT     June 27 2018   ESOPHAGOGASTRODUODENOSCOPY (EGD) WITH PROPOFOL N/A 05/25/2018   Procedure: ESOPHAGOGASTRODUODENOSCOPY (EGD) WITH PROPOFOL;  Surgeon: Lucilla Lame, MD;  Location: [facility] ENDOSCOPY;  Service: Endoscopy;  Laterality: N/A;   ESOPHAGOGASTRODUODENOSCOPY (EGD) WITH PROPOFOL N/A 11/06/2019   Procedure: ESOPHAGOGASTRODUODENOSCOPY (EGD) WITH PROPOFOL;  Surgeon: Virgel Manifold, MD;  Location: [facility] ENDOSCOPY;  Service: Endoscopy;  Laterality: N/A;   ESOPHAGOGASTRODUODENOSCOPY (EGD) WITH PROPOFOL N/A 06/29/2022   Procedure: ESOPHAGOGASTRODUODENOSCOPY (EGD) WITH PROPOFOL;  Surgeon: Lin Landsman, MD;  Location: [place];  Service:  Gastroenterology;  Laterality: N/A;   GIVENS CAPSULE STUDY N/A 07/22/2022   Procedure: GIVENS CAPSULE STUDY;  Surgeon: Lin Landsman, MD;  Location: [facility] ENDOSCOPY;  Service: Gastroenterology;  Laterality: N/A;   MITRAL VALVE REPLACEMENT      MULTIPLE TOOTH EXTRACTIONS     TONSILLECTOMY     TOTAL KNEE ARTHROPLASTY Right 09/04/2016   Procedure: TOTAL KNEE ARTHROPLASTY; with lateral release;  Surgeon: Earlie Server, MD;  Location: [place];  Service: Orthopedics;  Laterality: Right;    Medications Prior to Admission  Medication Sig Dispense Refill Last Dose   albuterol (VENTOLIN HFA) 108 (90 Base) MCG/ACT inhaler Inhale 1-2 puffs into the lungs every 6 (six) hours as needed for wheezing or shortness of breath.   Unknown at PRN   allopurinol (ZYLOPRIM) 100 MG tablet Take 1 tablet (100 mg total) by mouth 2 (two) times daily. 60 tablet 5 Past Week at Unknown   Cholecalciferol (VITAMIN D) 50 MCG (2000 UT) CAPS Take 1 capsule (2,000 Units total) by mouth daily. 30 capsule 5    colchicine 0.6 MG tablet Take 1 tablet (0.6 mg total) by mouth daily. As directed 30 tablet 5 Unknown at PRN   docusate sodium (COLACE) 100 MG capsule Take 100 mg by mouth 2 (two) times daily as needed for mild constipation or moderate constipation.   Unknown at PRN   empagliflozin (JARDIANCE) 10 MG TABS tablet Take 10 mg by mouth daily.   Past Week at Unknown   furosemide (LASIX) 40 MG tablet TAKE 2 TABLETS BY MOUTH IN THE MORNING AND 1 TABLET AT NIGHT (Patient taking differently: 40 mg 2 (two) times daily.) 90 tablet 5 Unknown at Unknown   gabapentin (NEURONTIN) 600 MG tablet Take 0.5 tablets (300 mg total) by mouth 2 (two) times daily. (Patient taking differently: Take 600 mg by mouth 3 (three) times daily.) 60 tablet 0 Past Week at Unknown   ipratropium-albuterol (DUONEB) 0.5-2.5 (3) MG/3ML SOLN INHALE THE CONTENTS OF 1 VIAL(3MLS) VIA NEBULIZER 3 TIMES DAILY AS NEEDED 360 mL 1    metolazone (ZAROXOLYN) 2.5 MG tablet Take 5 mg by mouth 3 (three) times a week.   Past Week at Unknown   metoprolol tartrate (LOPRESSOR) 100 MG tablet Take 1 tablet (100 mg total) by mouth 2 (two) times daily. 60 tablet 3 Past Week at Unknown   nitroGLYCERIN (NITROSTAT) 0.4 MG SL tablet  Place 1 tablet (0.4 mg total) under the tongue every 5 (five) minutes as needed for chest pain. 30 tablet 12 Unknown at PRN   omeprazole (PRILOSEC) 40 MG capsule Take 40 mg by mouth daily.   Past Week at Unknown   oxyCODONE ER 9 MG C12A Take 9 mg by mouth 2 (two) times daily.   Past Week at Unknown   potassium chloride (KLOR-CON) 10 MEQ tablet Take 4 tablets (40 mEq total) by mouth 2 (two) times daily. And additional 74mq on metolazone days (Patient taking differently: Take 40 mEq by mouth See admin instructions. Take 4 tablets (411m) by mouth twice daily - take an additional 2 tablets (203m by mouth when taking metolazone) 64 tablet 5 Past Week at Unknown   promethazine (PHENERGAN) 12.5 MG tablet Take 1 tablet (12.5 mg total) by mouth every 8 (eight) hours as needed for nausea or vomiting. (Patient taking differently: Take 12.5 mg by mouth 2 (two) times daily as needed for nausea or vomiting.) 30 tablet 0 Unknown at PRN   rivaroxaban (XARELTO) 20 MG TABS tablet Take 20 mg by  mouth daily with supper.   Past Week at Unknown   rosuvastatin (CRESTOR) 10 MG tablet Take 10 mg by mouth daily.   Past Week at Unknown   Iron-FA-B Cmp-C-Biot-Probiotic (FUSION PLUS) CAPS Take 1 capsule by mouth daily. (Patient not taking: Reported on 09/16/2022) 30 capsule 3 Not Taking   Social History   Socioeconomic History   Marital status: Married    Spouse name: Not on file   Number of children: Not on file   Years of education: Not on file   Highest education level: Not on file  Occupational History   Occupation: disabled  Tobacco Use   Smoking status: Every Day    Packs/day: 0.10    Types: Cigarettes   Smokeless tobacco: Never   Tobacco comments:    4/20 was not able to quit.  Still at 1-3 a day. She would like to wait to set a new quit date until we get back to class to have the extra in person support  Vaping Use   Vaping Use: Never used  Substance and Sexual Activity   Alcohol use: Never     Alcohol/week: 3.0 standard drinks of alcohol    Types: 3 Cans of beer per week    Comment: 16 oz per week   Drug use: Not Currently    Comment: Previous use of cocaine and marijuana last use 07/31/16    Sexual activity: Not Currently  Other Topics Concern   Not on file  Social History Narrative   Not on file   Social Determinants of Health   Financial Resource Strain: Low Risk  (10/02/2019)   Overall Financial Resource Strain (CARDIA)    Difficulty of Paying Living Expenses: Not hard at all  Food Insecurity: No Food Insecurity (09/18/2022)   Hunger Vital Sign    Worried About Running Out of Food in the Last Year: Never true    Ran Out of Food in the Last Year: Never true  Transportation Needs: No Transportation Needs (09/18/2022)   PRAPARE - Hydrologist (Medical): No    Lack of Transportation (Non-Medical): No  Physical Activity: Insufficiently Active (10/02/2019)   Exercise Vital Sign    Days of Exercise per Week: 7 days    Minutes of Exercise per Session: 20 min  Stress: No Stress Concern Present (10/02/2019)   [place]    Feeling of Stress : Not at all  Social Connections: Moderately Integrated (10/02/2019)   Social Connection and Isolation Panel [NHANES]    Frequency of Communication with Friends and Family: More than three times a week    Frequency of Social Gatherings with Friends and Family: More than three times a week    Attends Religious Services: 1 to 4 times per year    Active Member of [employer] or Organizations: Yes    Attends Archivist Meetings: 1 to 4 times per year    Marital Status: Never married  Intimate Partner Violence: Not At Risk (09/18/2022)   Humiliation, Afraid, Rape, and Kick questionnaire    Fear of Current or Ex-Partner: No    Emotionally Abused: No    Physically Abused: No    Sexually Abused: No    Family History  Problem Relation Age of Onset    COPD Mother    Cancer Mother        Bone   Asthma Mother    Rheum arthritis Mother    Congestive  Heart Failure Father    Breast cancer Maternal Aunt      Vitals:   09/20/22 2323 09/21/22 0337 09/21/22 0500 09/21/22 0755  BP: 96/70 93/64  106/76  Pulse: 87 76  79  Resp: '16 17  18  '$ Temp: 97.7 F (36.5 C) (!) 97.5 F (36.4 C)  98.2 F (36.8 C)  TempSrc: Oral Oral    SpO2: 96% 97%  99%  Weight:   113.9 kg   Height:        PHYSICAL EXAM General: Chronically ill-appearing black female, well nourished, in no acute distress.  Laying on her left side in hospital bed without family at bedside. HEENT:  Normocephalic and atraumatic. Neck:  No JVD.  Lungs: Normal respiratory effort on oxygen by nasal cannula.  Decreased breath sounds bilaterally without appreciable crackles or wheezes.   Heart: Irregularly irregular with controlled rate. Normal S1 and S2 without gallops or murmurs.  Abdomen: Non-distended appearing with excess adiposity..  Msk: Normal strength and tone for age. Extremities: Warm and well perfused. No clubbing, cyanosis.  1+ bilateral lower extremity edema.  Neuro: Arouses to voice, but closes her eyes often  Psych: Somnolent, is a very limited historian.  Labs: Basic Metabolic Panel: Recent Labs    09/20/22 0317 09/21/22 0548 09/21/22 0555  NA 139  --  137  K 3.1*  --  3.7  CL 100  --  98  CO2 31  --  28  GLUCOSE 78  --  75  BUN 37*  --  35*  CREATININE 1.88*  --  1.89*  CALCIUM 8.4*  --  8.6*  MG  --  1.5*  --    Liver Function Tests: Recent Labs    09/20/22 0317 09/21/22 0555  AST 46* 53*  ALT 30 30  ALKPHOS 68 68  BILITOT 9.1* 8.5*  PROT 6.6 6.5  ALBUMIN 2.6* 2.6*   No results for input(s): "LIPASE", "AMYLASE" in the last 72 hours. CBC: Recent Labs    09/20/22 0317 09/21/22 0555  WBC 5.5 5.4  HGB 8.9* 9.0*  HCT 31.4* 32.1*  MCV 78.3* 77.5*  PLT 174 156   Cardiac Enzymes: No results for input(s): "CKTOTAL", "CKMB", "CKMBINDEX",  "TROPONINIHS" in the last 72 hours. BNP: No results for input(s): "BNP" in the last 72 hours. D-Dimer: No results for input(s): "DDIMER" in the last 72 hours. Hemoglobin A1C: No results for input(s): "HGBA1C" in the last 72 hours. Fasting Lipid Panel: No results for input(s): "CHOL", "HDL", "LDLCALC", "TRIG", "CHOLHDL", "LDLDIRECT" in the last 72 hours. Thyroid Function Tests: No results for input(s): "TSH", "T4TOTAL", "T3FREE", "THYROIDAB" in the last 72 hours.  Invalid input(s): "FREET3" Anemia Panel: No results for input(s): "VITAMINB12", "FOLATE", "FERRITIN", "TIBC", "IRON", "RETICCTPCT" in the last 72 hours.   Radiology: ECHOCARDIOGRAM COMPLETE  Result Date: 09/17/2022    ECHOCARDIOGRAM REPORT   Patient Name:   CAMIA DIPINTO Date of Exam: 09/17/2022 Medical Rec #:  [id number]                 Height:       64.0 in Accession #:    [id number]                Weight:       265.0 lb Date of Birth:  [birth year]                 BSA:          2.205 m Patient Age:  56 years                  BP:           93/79 mmHg Patient Gender: F                         HR:           110 bpm. Exam Location:  [facility] Procedure: 2D Echo, Color Doppler and Cardiac Doppler Indications:     I50.31 CHF-Acute Diastolic  History:         Patient has prior history of Echocardiogram examinations, most                  recent 11/05/2018. CHF, CAD, COPD; Risk Factors:Hypertension,                  Diabetes and Sleep Apnea.  Sonographer:     Charmayne Sheer Referring Phys:  [id number] Maxwell Natausha Jungwirth Diagnosing Phys: Yolonda Kida MD  Sonographer Comments: Suboptimal apical window and no subcostal window. Image acquisition challenging due to patient body habitus and Image acquisition challenging due to COPD. IMPRESSIONS  1. Left ventricular ejection fraction, by estimation, is 20 to 25%. The left ventricle has severely decreased function. The left ventricle demonstrates global hypokinesis. Left ventricular diastolic  parameters are consistent with Grade III diastolic dysfunction (restrictive). There is the interventricular septum is flattened in systole and diastole, consistent with right ventricular pressure and volume overload.  2. Right ventricular systolic function is moderately reduced. The right ventricular size is moderately enlarged. Mildly increased right ventricular wall thickness.  3. Left atrial size was moderately dilated.  4. Right atrial size was severely dilated.  5. The mitral valve is abnormal. Mild to moderate mitral valve regurgitation.  6. Tricuspid valve regurgitation is severe.  7. The aortic valve is calcified. Aortic valve regurgitation is mild to moderate. Aortic valve sclerosis/calcification is present, without any evidence of aortic stenosis. Conclusion(s)/Recommendation(s): Poor windows for evaluation of left ventricular function by transthoracic echocardiography. Would recommend an alternative means of evaluation. FINDINGS  Left Ventricle: Left ventricular ejection fraction, by estimation, is 20 to 25%. The left ventricle has severely decreased function. The left ventricle demonstrates global hypokinesis. The left ventricular internal cavity size was normal in size. There is borderline concentric left ventricular hypertrophy. The interventricular septum is flattened in systole and diastole, consistent with right ventricular pressure and volume overload. Left ventricular diastolic parameters are consistent with Grade III diastolic dysfunction (restrictive). Right Ventricle: The right ventricular size is moderately enlarged. Mildly increased right ventricular wall thickness. Right ventricular systolic function is moderately reduced. Left Atrium: Left atrial size was moderately dilated. Right Atrium: Right atrial size was severely dilated. Pericardium: There is no evidence of pericardial effusion. Mitral Valve: The mitral valve is abnormal. Mild to moderate mitral valve regurgitation. Tricuspid Valve:  The tricuspid valve is grossly normal. Tricuspid valve regurgitation is severe. Aortic Valve: The aortic valve is calcified. Aortic valve regurgitation is mild to moderate. Aortic regurgitation PHT measures 233 msec. Aortic valve sclerosis/calcification is present, without any evidence of aortic stenosis. Aortic valve mean gradient measures 6.0 mmHg. Aortic valve peak gradient measures 11.3 mmHg. Aortic valve area, by VTI measures 1.14 cm. Pulmonic Valve: The pulmonic valve was normal in structure. Pulmonic valve regurgitation is mild to moderate. Aorta: The ascending aorta was not well visualized. IAS/Shunts: No atrial level shunt detected by color flow Doppler.  LEFT VENTRICLE PLAX 2D LVIDd:  3.50 cm LVIDs:         3.20 cm LV PW:         1.20 cm LV IVS:        0.90 cm LVOT diam:     1.80 cm LV SV:         25 LV SV Index:   12 LVOT Area:     2.54 cm  RIGHT VENTRICLE RV Basal diam:  5.30 cm RV Mid diam:    5.00 cm RV S prime:     7.62 cm/s LEFT ATRIUM         Index       RIGHT ATRIUM           Index LA diam:    5.50 cm 2.49 cm/m  RA Area:     28.50 cm                                 RA Volume:   104.00 ml 47.17 ml/m  AORTIC VALVE                     PULMONIC VALVE AV Area (Vmax):    0.87 cm      PV Vmax:          0.98 m/s AV Area (Vmean):   0.90 cm      PV Vmean:         69.200 cm/s AV Area (VTI):     1.14 cm      PV VTI:           0.128 m AV Vmax:           168.00 cm/s   PV Peak grad:     3.9 mmHg AV Vmean:          116.000 cm/s  PV Mean grad:     2.5 mmHg AV VTI:            0.223 m       PR End Diast Vel: 10.24 msec AV Peak Grad:      11.3 mmHg AV Mean Grad:      6.0 mmHg LVOT Vmax:         57.60 cm/s LVOT Vmean:        40.900 cm/s LVOT VTI:          0.100 m LVOT/AV VTI ratio: 0.45 AI PHT:            233 msec  AORTA Ao Root diam: 3.10 cm MITRAL VALVE               TRICUSPID VALVE MV Area (PHT): 5.95 cm    TR Peak grad:   55.7 mmHg MV Decel Time: 128 msec    TR Vmax:        373.00 cm/s MV E  velocity: 87.60 cm/s                            SHUNTS                            Systemic VTI:  0.10 m                            Systemic Diam: 1.80 cm Yolonda Kida MD Electronically signed by Karma Greaser  Prince Rome MD Signature Date/Time: 09/17/2022/4:54:34 PM    Final    DG Chest 1 View  Result Date: 09/16/2022 CLINICAL DATA:  Worsening shortness of breath. 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 Cardiomegaly which has increased from 2021. Slight globular configuration of the heart, query pericardial effusion. Suspect small right pleural effusion. Chronic bilateral lung opacities that appears stable. No acute airspace disease or pneumothorax. On limited assessment, no acute osseous findings. IMPRESSION: 1. Increased cardiomegaly from 2021. Slight globular configuration of the heart, query pericardial effusion. 2. Suspect small right pleural effusion. 3. Stable chronic bilateral lung opacities. Electronically Signed   By: Keith Rake M.D.   On: 09/16/2022 17:21   MR ABDOMEN MRCP WO CONTRAST  Result Date: 09/16/2022 CLINICAL DATA:  56 year old female with history of jaundice. Nausea. EXAM: MRI ABDOMEN WITHOUT CONTRAST  (INCLUDING MRCP) TECHNIQUE: Multiplanar multisequence MR imaging of the abdomen was performed. Heavily T2-weighted images of the biliary and pancreatic ducts were obtained, and three-dimensional MRCP images were rendered by post processing. COMPARISON:  No prior abdominal MRI or MRCP. Abdominal ultrasound 09/15/2022. FINDINGS: Comment: Portions of today's examination are substantially limited by patient respiratory motion. The study is also severely limited for detection and characterization of visceral and/or vascular lesions by lack of IV gadolinium. Lower chest: Severe cardiomegaly. Massively distended inferior vena cava. Hepatobiliary: No definite  suspicious cystic or solid hepatic lesions are confidently identified on today's motion limited noncontrast examination. No intra or extrahepatic biliary ductal dilatation noted on MRCP images. Common bile duct measures only 3 mm in the porta hepatis. No filling defect within the common bile duct to suggest choledocholithiasis. T1 hyperintense, T2 hypointense material lies dependently within the gallbladder. Gallbladder does not appear overly distended. Accurate assessment for gallbladder wall thickening is limited on today's examination secondary to motion, but there may be some very mild gallbladder wall thickening and edema. No overt pericholecystic fluid or surrounding inflammatory changes. Pancreas: No definite pancreatic mass or peripancreatic fluid collections or inflammatory changes noted on today's noncontrast examination. No pancreatic ductal dilatation. Spleen:  Unremarkable. Adrenals/Urinary Tract: Unenhanced appearance of the kidneys and bilateral adrenal glands is normal. No hydroureteronephrosis in the visualized portions of the abdomen. Stomach/Bowel: Visualized portions are unremarkable. Vascular/Lymphatic: No aneurysm identified in the visualized abdominal vasculature. No lymphadenopathy noted in the abdomen. Other: No significant volume of ascites noted in the visualized portions of the peritoneal cavity. Musculoskeletal: No aggressive appearing osseous lesions are noted in the visualized portions of the skeleton. IMPRESSION: 1. Biliary sludge lying dependently in the gallbladder. Gallbladder wall may be mildly edematous, but is poorly evaluated on today's motion limited examination. If there is any clinical concern for acute cholecystitis, further evaluation with right upper quadrant abdominal ultrasound or nuclear medicine hepatobiliary scan should be considered at this time. 2. No biliary tract dilatation to suggest obstruction. No evidence of choledocholithiasis. 3. Severe cardiomegaly.  Electronically Signed   By: Vinnie Langton M.D.   On: 09/16/2022 05:28   US ABDOMEN LIMITED RUQ (LIVER/GB)  Result Date: 09/15/2022 CLINICAL DATA:  Elevated bilirubin EXAM: ULTRASOUND ABDOMEN LIMITED RIGHT UPPER QUADRANT COMPARISON:  CT abdomen and pelvis 12/01/2018 FINDINGS: Gallbladder: Sludge is present within the gallbladder. Small amount of pericholecystic fluid. The gallbladder wall is mildly thickened measuring 5 mm. Sonographic Murphy's sign was positive. Common bile duct: Diameter: 3 mm Liver: No focal lesion identified. Increased echogenicity. Portal vein is  patent on Doppler imaging with bidirectional flow. Other: None. IMPRESSION: 1. Findings compatible with acute cholecystitis. 2. 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 Electronically Signed   By: Placido Sou M.D.   On: 09/15/2022 23:07     TELEMETRY reviewed by me (LT) 09/21/2022 : Atrial flutter with 4: 1 conduction rate 70s to 80s  EKG reviewed by me: Atrial flutter with 4 1 conduction rate 84, QTc 470 on 10/23  Data reviewed by me (LT) 09/21/2022: Admission H&P, hospitalist progress note, CBC, CMP, magnesium, I's and O's, vitals, telemetry, ordered repeat EKG  Principal Problem:   Acute on chronic HFrEF (heart failure with reduced ejection fraction) (HCC) Active Problems:   A-fib (HCC)   HTN (hypertension)   Chronic low back pain (Primary Area of Pain) (Bilateral) w/ sciatica (Left)   Class 3 severe obesity with body mass index (BMI) of 45.0 to 49.9 in adult (HCC)   Moderate to severe pulmonary hypertension (HCC)   Tobacco dependence   History of cocaine use   Iron deficiency anemia   Abdominal pain   AKI (acute kidney injury) ([place])   Elevated LFTs   Hypokalemia    ASSESSMENT AND PLAN:  Carine C. 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  Labs on admission showed acute renal failure, transaminitis, and hyperbilirubinemia, ultimately attributed to volume overload from her heart failure exacerbation.  Her echo revealed a newly reduced ejection fraction as above and her hospital course has been complicated by atrial fibrillation with RVR.  #Acute on chronic HFrEF (EF 20-25%, global hypokinesis and G3 DD, mild to moderate MR) #Rheumatic mitral valve stenosis s/p MVR 2019 #Abdominal pain BNP on admission was elevated at 558.5, echo shows significant reduction in EF from >55% 06/2021.  Her abdominal pain was ultimately attributed to volume overload/hepatic insufficiency after acute cholecystitis, choledocholithiasis, and a hematologic disorder were ruled out by general surgery, GI, and hematology. -S/p IV Lasix 40 mg x 7 doses with some improvement in her renal function -Continue IV Lasix 40 mg twice daily for now -Home diuretics are Lasix 40 p.o. once daily, metolazone 2.5 mg once weekly (was instructed to take 5 mg 3 times weekly) -Unclear etiology of her reduction in EF, she had normal coronaries by [facility] in 2019, query if her atrial fibrillation is contributory versus medication noncompliance.  No plan for ischemic work-up while inpatient.  #Paroxysmal atrial fibrillation with RVR, now in atrial flutter Was previously managed well on metoprolol tartrate 100 mg twice daily. -discontinue sotalol 80 mg once daily (started on 10/20) due to QTc prolongation (401 on 10/21, 470 on 10/23) -Restart metoprolol tartrate 25 mg every 8 hours -Discontinue diltiazem due to reduced EF -She admits compliance to her home Xarelto 20 mg without any missed doses.  She was on a heparin infusion on admission, has  been switched back to her Xarelto, 20 mg okay per her total body weight and creatinine clearance per pharmacy. -Continue rate control strategy, consider DCCV on an outpatient basis -Monitor and replete electrolytes to maintain a K >4, mag >2  #Transaminitis, hyperbilirubinemia s/p MRCP -See above  #AKI  Presented with a BUN/creatinine of 50/3.52 and GFR 15, which was fortunately improved to creatinine 1.89 and GFR of 31 today -Daily CMP   This patient's plan of care was discussed and created with Dr. Nehemiah Massed and he is in agreement.  Signed: Tristan Schroeder , PA-C 09/21/2022, 11:37 AM [facility]

## 2022-09-21 NOTE — Progress Notes (Signed)
Progress Note   Patient: Chelsea Ramsey DQQ:[id number] DOB: 03-Aug-1966 DOA: 09/15/2022     5 DOS: the patient was seen and examined on 09/21/2022   Brief hospital course: Taken from prior notes.  Patient is 56 year old female with DM, pulm HTN, COPD, CHF, GERD, OSA was seen by PCP on day of admission for not feeling well and abd pain for about a month. Patient complained of pain in RUQ and right flank with nausea.   abnormal outpatient labs showing AKI.   In ED, her labwork confirmed AKI with Cr 3.54, total bilirubin of 9.6.  AST mildly elevated to 57, ALT 35, Alk Phos 80.  U/S was done which showed gallbladder wall thickening to 5 mm with pericholecystic fluid, suspicious for acute cholecystitis.  CBD per U/S was 3 mm.   Surgery was consulted, recommended MRCP - MRCP showed biliary sludge, GB mild edematous. No biliary tract dilation to suggest obstruction.  No choledocholithiasis. 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  Also found to have acute systolic on chronic diastolic heart failure.  Echocardiogram shows EF of 20 to 25% with grade 3 diastolic dysfunction, right ventricular pressure and volume overload.  Prior echo done in December 2019 with a EF of 50 to 55%.  She was started on IV diuresis.  As blood pressure remained soft which interfere with other medical management, she was started on midodrine.  Remained in A-fib with intermittently going in RVR.  Missed Cardizem doses yesterday due to softer blood pressure.  Patient was on heparin infusion for the past 5 days, do not see any plan for cardiac cath by cardiology.  No clear recommendations. CMP remained with elevated T. bili at 9.1, she continued to have  upper abdominal pain. Concern of cardiohepatic with hepatic congestion secondary to heart failure. Secure chat with Dr. Humphrey Rolls and we will discontinue heparin infusion today and restart her home Xarelto.  Patient will get benefit from ischemic work-up with this new HFrEF. Norco switched with oxycodone immediate release to see if that will better serve her pain. No recorded urinary output as she was receiving IV diuresis-message sent to nursing staff. Potassium at 3.1 which is being repleted.  Renal functions continue to improve slowly.  10/23: Discussed with Dr. Nehemiah Massed on chat, there is no need for immediate ischemic work-up. T. bili started improving, at 8.5 today.  Rest of the labs improved.  Renal function stable. Pain with better control after changing to oxycodone.  Awaiting disposition   Assessment and Plan: * Acute on chronic HFrEF (heart failure with reduced ejection fraction) ([place]) Patient was found to have new onset HFrEF with EF of 20 to 25% and grade 3 diastolic dysfunction.  Had low normal EF on prior echocardiogram done in 2020. Cardiology was consulted-no clear recommendations about ischemic work-up as she will get benefit from.  She is being diuresed with no recorded output. -Continue with IV Lasix 40 mg twice daily. -Need strict intake and output -Daily weight and CMP -Continue to monitor  Abdominal pain Pt c/o right sided abd pain for one month.  Work-up so far with MRCP shows biliary sludge, mildly edematous gallbladder, no biliary tract dilation to suggest obstruction.  No choledocholithiasis. 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  T. bili  remained stable around 9.1, -Switch Norco with immediate release oxycodone.    Elevated LFTs Most likely secondary to congestive hepatopathy, also concern of [place] liver disease.  Liver enzymes improved with stable T. bili at 9.1. GI and general surgery signed off. Patient  is being diuresed. -Continue to monitor  AKI (acute kidney injury) [facility]) Patient presented with creatinine of 3.54 with baseline around 1. Slowly improving.  Concern of cardiorenal -Monitor renal function while she is being diuresed -Avoid renal toxins  HTN (hypertension) Blood pressure remains soft which interfere with necessary medical management which include diuretics and Cardizem for rate control. She was started on midodrine with some improvement. -Continue with midodrine -Monitor closely when she is being diuresed with Lasix and getting Cardizem, amiodarone allergies were noted.    Iron deficiency anemia Pt has h/o IDA / is currently anemic and is on xarelto for a/fib. In afib rvr on ekg.  Pt being followed by Dr.vanga for her IDA and Eval for GI loss. GI eval showed: Endoscopy Shows Studies Complaint Capsule History Speak, Normal Small Bowel Transit, Scattered Small Bowel Lymphangiectasia's, No Source of Iron Deficiency Anemia Identified - 08/18/2022. Endoscopy shows erythematous  mucosa in the antrum, normal duodenal bulb and second portion of duodenum, colonoscopy showed melanosis coli but was otherwise normal 06/29/2022.  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 Currently hemoglobin stable. Can-continue to monitor -Continue with iron supplement-restarting home meds.  A-fib (HCC) Remained in A-fib with going in and out of RVR. -Continue with Cardizem, beta-blocker and Xarelto   Tobacco dependence -Nicotine patch.   History of cocaine use UDS done on 09/16/2022 were positive for benzodiazepines, opioids and tricyclic.   Chronic low back pain (Primary Area of Pain) (Bilateral) w/ sciatica (Left) Seems to have an history of chronic pain syndrome and fibromyalgia. She was on oxycodone and Neurontin at home. -Restarting oxycodone and Neurontin  Class 3 severe obesity with body mass index (BMI) of  45.0 to 49.9 in adult [facility]) Estimated body mass index is 42.76 kg/m as calculated from the following:   Height as of this encounter: 5' 4"  (1.626 m).   Weight as of this encounter: 113 kg.   -This will complicate overall prognosis  Hypokalemia Replace potassium and monitor  Moderate to severe pulmonary hypertension (HCC) Chronic respiratory failure. Patient is on 3 to 4 L of oxygen at home.  Currently at baseline. -Continue to monitor   Subjective: Patient was seen and examined today.  Upper abdominal pain seems improving, she was happy with the current pain regimen.  Physical Exam: Vitals:   09/21/22 0500 09/21/22 0755 09/21/22 1235 09/21/22 1513  BP:  106/76 95/60 99/64   Pulse:  79 85 81  Resp:  18 17 17   Temp:  98.2 F (36.8 C) 98.4 F (36.9 C) 98.3 F (36.8 C)  TempSrc:   Oral Oral  SpO2:  99% 99% 98%  Weight: 113.9 kg     Height:       General.  Ill-appearing lady, in no acute distress. Pulmonary.  Lungs clear bilaterally, normal respiratory effort. CV.  Regular rate and rhythm, no JVD, rub or murmur. Abdomen.  Soft, nontender, nondistended, BS positive. CNS.  Alert and oriented .  No focal neurologic deficit. Extremities.  1+ LE edema, no cyanosis, pulses intact and symmetrical. Psychiatry.  Appears to have some cognitive impairment  Data Reviewed: Prior data reviewed  Family Communication: Discussed with husband at bedside  Disposition: Status  is: Inpatient Remains inpatient appropriate because: Severity of illness   Planned Discharge Destination: Home with Home Health  DVT prophylaxis.  Xarelto Time spent: 48 minutes  This record has been created using Systems analyst. Errors have been sought and corrected,but may not always be located. Such creation errors do not reflect on the standard of care.  Author: Lorella Nimrod, MD 09/21/2022 3:17 PM  For on call review [email or web address].

## 2022-09-21 NOTE — Progress Notes (Signed)
Occupational Therapy Treatment Patient Details Name: Chelsea Ramsey MRN: [id number] DOB: 10-15-1966 Today's Date: 09/21/2022   History of present illness Pt is a 56 y.o. female  Seen by pcp today for not feeling well and abd pain for about a month per patient.   Her pcp called and advised she come to ed for abnormal labs. MD assessment includes: Right sided abdominal pain, transaminitis, hyperbilirubinemia, AKI, and iron deficiency anemia.   OT comments  Pt received sleeping, partially sidelying in bed; easily awoken to sound of her name. Appearing sleepy and lethargic, improving slightly during session, but still with delayed responses to questions and commands; willing to work with OT on sitting at EOB to eat. T/f SBA to EOB; MOD A back into bed. See flowsheet below for further details of session, including UE deficits during eating. Left sidelying with HOB elevated as pt states she is going to eat some more; ice brought for her soda; pt with all needs in reach. OT communicated with RN and PT about pt status. Bed alarm on.     Recommendations for follow up therapy are one component of a multi-disciplinary discharge planning process, led by the attending physician.  Recommendations may be updated based on patient status, additional functional criteria and insurance authorization.    Follow Up Recommendations  Skilled nursing-short term rehab (<3 hours/day)    Assistance Recommended at Discharge Intermittent Supervision/Assistance  Patient can return home with the following  A lot of help with walking and/or transfers;A lot of help with bathing/dressing/bathroom;Help with stairs or ramp for entrance;Assist for transportation;Direct supervision/assist for medications management;Direct supervision/assist for financial management;Assistance with cooking/housework   Equipment Recommendations  Other (comment) (defer to next venue of care)    Recommendations for Other Services       Precautions / Restrictions Precautions Precautions: Fall Restrictions Weight Bearing Restrictions: No       Mobility Bed Mobility Overal bed mobility: Needs Assistance Bed Mobility: Supine to Sit, Sit to Supine     Supine to sit: HOB elevated, Supervision (greatly increased time) Sit to supine: Mod assist (for LE)   General bed mobility comments: Pt very slow with mobility; needing BIL LE assist for sit to supine. Pt very lethargic and responding slowly.    Transfers                         Balance                                           ADL either performed or assessed with clinical judgement   ADL Overall ADL's : Needs assistance/impaired Eating/Feeding: Set up;Supervision/ safety (much increased time; assistance with silverware she's dropped on the floor) Eating/Feeding Details (indicate cue type and reason): Pt with great difficulty and much increased time with coordinating use of silverware today; RUE appears slightly shaky; poor use of pronation/supination to control wrist for spearing food with fork, as well as difficulty maintaining grip on silverware. Pt declining OT's offer of trying built-up handle.                                        Extremity/Trunk Assessment Upper Extremity Assessment Upper Extremity Assessment: RUE deficits/detail RUE Deficits / Details: Pt eating with RUE; slightly shaky, difficulty  spearing food with fork (decreased coordination); dropped spoon on floor. Using LUE to lean on while seated unsupported at EOB; LUE shaking (likely weakness); able to maintain static sitting on EOB without UE support, but often puts L hand on bed. RUE Coordination: decreased fine motor   Lower Extremity Assessment Lower Extremity Assessment: Defer to PT evaluation (able to slowly move BIL LE off EOB)        Vision       Perception     Praxis      Cognition Arousal/Alertness: Lethargic Behavior During  Therapy: Flat affect Overall Cognitive Status: No family/caregiver present to determine baseline cognitive functioning                                 General Comments: Pt awake but lethargic; extra time to follow commands and do task (eating today seated at EOB).        Exercises      Shoulder Instructions       General Comments      Pertinent Vitals/ Pain       Pain Assessment Pain Assessment: 0-10 Pain Score: 7  Pain Location: abdominal pain Pain Descriptors / Indicators: Sore Pain Intervention(s): Limited activity within patient's tolerance, Repositioned  Home Living                                          Prior Functioning/Environment              Frequency  Min 2X/week        Progress Toward Goals  OT Goals(current goals can now be found in the care plan section)  Progress towards OT goals: Progressing toward goals  Acute Rehab OT Goals Patient Stated Goal: go home OT Goal Formulation: With patient Time For Goal Achievement: 10/02/22 Potential to Achieve Goals: Fair ADL Goals Pt Will Perform Grooming: with supervision;standing Pt Will Transfer to Toilet: with supervision;ambulating Pt Will Perform Toileting - Clothing Manipulation and hygiene: with supervision;sit to/from stand Pt/caregiver will Perform Home Exercise Program: Increased strength;Both right and left upper extremity;With written HEP provided;With Supervision;With theraband  Plan Discharge plan remains appropriate    Co-evaluation                 AM-PAC OT "6 Clicks" Daily Activity     Outcome Measure   Help from another person eating meals?: A Little Help from another person taking care of personal grooming?: A Little Help from another person toileting, which includes using toliet, bedpan, or urinal?: A Lot Help from another person bathing (including washing, rinsing, drying)?: A Lot Help from another person to put on and taking off regular  upper body clothing?: A Little Help from another person to put on and taking off regular lower body clothing?: A Lot 6 Click Score: 15    End of Session Equipment Utilized During Treatment: Oxygen  OT Visit Diagnosis: Unsteadiness on feet (R26.81);Repeated falls (R29.6);Muscle weakness (generalized) (M62.81)   Activity Tolerance Patient limited by fatigue   Patient Left in bed;with call bell/phone within reach;with bed alarm set   Nurse Communication Mobility status        Time: 3154-0086 OT Time Calculation (min): 48 min  Charges: OT General Charges $OT Visit: 1 Visit OT Treatments $Self Care/Home Management : 38-52 mins  Waymon Amato, MS, OTR/L  Vania Rea 09/21/2022, 2:40 PM

## 2022-09-22 DIAGNOSIS — R040 Epistaxis: Secondary | ICD-10-CM | POA: Diagnosis not present

## 2022-09-22 DIAGNOSIS — G9341 Metabolic encephalopathy: Secondary | ICD-10-CM

## 2022-09-22 DIAGNOSIS — I5023 Acute on chronic systolic (congestive) heart failure: Secondary | ICD-10-CM | POA: Diagnosis not present

## 2022-09-22 LAB — COMPREHENSIVE METABOLIC PANEL
ALT: 28 U/L (ref 0–44)
AST: 65 U/L — ABNORMAL HIGH (ref 15–41)
Albumin: 2.7 g/dL — ABNORMAL LOW (ref 3.5–5.0)
Alkaline Phosphatase: 69 U/L (ref 38–126)
Anion gap: 12 (ref 5–15)
BUN: 36 mg/dL — ABNORMAL HIGH (ref 6–20)
CO2: 30 mmol/L (ref 22–32)
Calcium: 8.7 mg/dL — ABNORMAL LOW (ref 8.9–10.3)
Chloride: 99 mmol/L (ref 98–111)
Creatinine, Ser: 1.66 mg/dL — ABNORMAL HIGH (ref 0.44–1.00)
GFR, Estimated: 36 mL/min — ABNORMAL LOW (ref >60)
Glucose, Bld: 80 mg/dL (ref 70–99)
Potassium: 4.1 mmol/L (ref 3.5–5.1)
Sodium: 141 mmol/L (ref 135–145)
Total Bilirubin: 8.8 mg/dL — ABNORMAL HIGH (ref 0.3–1.2)
Total Protein: 7 g/dL (ref 6.5–8.1)

## 2022-09-22 LAB — CBC
HCT: 33.4 % — ABNORMAL LOW (ref 36.0–46.0)
Hemoglobin: 9.2 g/dL — ABNORMAL LOW (ref 12.0–15.0)
MCH: 22 pg — ABNORMAL LOW (ref 26.0–34.0)
MCHC: 27.5 g/dL — ABNORMAL LOW (ref 30.0–36.0)
MCV: 79.9 fL — ABNORMAL LOW (ref 80.0–100.0)
Platelets: 172 10*3/uL (ref 150–400)
RBC: 4.18 MIL/uL (ref 3.87–5.11)
RDW: 22.7 % — ABNORMAL HIGH (ref 11.5–15.5)
WBC: 5.8 10*3/uL (ref 4.0–10.5)
nRBC: 0 % (ref 0.0–0.2)

## 2022-09-22 LAB — BLOOD GAS, ARTERIAL
Acid-Base Excess: 14.6 mmol/L — ABNORMAL HIGH (ref 0.0–2.0)
Bicarbonate: 40.4 mmol/L — ABNORMAL HIGH (ref 20.0–28.0)
O2 Content: 2 L/min
O2 Saturation: 92.3 %
Patient temperature: 37
pCO2 arterial: 53 mmHg — ABNORMAL HIGH (ref 32–48)
pH, Arterial: 7.49 — ABNORMAL HIGH (ref 7.35–7.45)
pO2, Arterial: 61 mmHg — ABNORMAL LOW (ref 83–108)

## 2022-09-22 LAB — BLOOD GAS, VENOUS
Acid-Base Excess: 9 mmol/L — ABNORMAL HIGH (ref 0.0–2.0)
Bicarbonate: 35.9 mmol/L — ABNORMAL HIGH (ref 20.0–28.0)
O2 Saturation: 50.5 %
Patient temperature: 37
pCO2, Ven: 58 mmHg (ref 44–60)
pH, Ven: 7.4 (ref 7.25–7.43)
pO2, Ven: 41 mmHg (ref 32–45)

## 2022-09-22 LAB — AMMONIA: Ammonia: 60 umol/L — ABNORMAL HIGH (ref 9–35)

## 2022-09-22 LAB — MAGNESIUM: Magnesium: 1.6 mg/dL — ABNORMAL LOW (ref 1.7–2.4)

## 2022-09-22 LAB — PROTIME-INR
INR: 3.6 — ABNORMAL HIGH (ref 0.8–1.2)
Prothrombin Time: 35.9 seconds — ABNORMAL HIGH (ref 11.4–15.2)

## 2022-09-22 LAB — APTT: aPTT: 45 seconds — ABNORMAL HIGH (ref 24–36)

## 2022-09-22 LAB — LACTIC ACID, PLASMA: Lactic Acid, Venous: 1.8 mmol/L (ref 0.5–1.9)

## 2022-09-22 MED ORDER — LACTULOSE ENEMA
300.0000 mL | Freq: Two times a day (BID) | ORAL | Status: DC
  Administered 2022-09-22: 300 mL via RECTAL
  Filled 2022-09-22 (×17): qty 300

## 2022-09-22 MED ORDER — OXYMETAZOLINE HCL 0.05 % NA SOLN: 2.0000 | NASAL | Status: AC | PRN

## 2022-09-22 MED ORDER — RIFAXIMIN 550 MG PO TABS
550.0000 mg | ORAL_TABLET | Freq: Two times a day (BID) | ORAL | Status: DC
  Administered 2022-09-22 – 2022-10-01 (×17): 550 mg via ORAL
  Filled 2022-09-22 (×19): qty 1

## 2022-09-22 MED ORDER — MAGNESIUM SULFATE 2 GM/50ML IV SOLN
2.0000 g | Freq: Once | INTRAVENOUS | Status: AC
  Administered 2022-09-22: 2 g via INTRAVENOUS
  Filled 2022-09-22: qty 50

## 2022-09-22 MED ORDER — RIVAROXABAN 20 MG PO TABS: 20.0000 mg | ORAL_TABLET | Freq: Every day | ORAL | Status: DC

## 2022-09-22 MED ORDER — VITAMIN K1 10 MG/ML IJ SOLN
10.0000 mg | Freq: Once | INTRAVENOUS | Status: AC
  Administered 2022-09-22: 10 mg via INTRAVENOUS
  Filled 2022-09-22: qty 1

## 2022-09-22 MED ORDER — METOLAZONE 5 MG PO TABS
5.0000 mg | ORAL_TABLET | Freq: Once | ORAL | Status: AC
  Administered 2022-09-22: 5 mg via ORAL
  Filled 2022-09-22: qty 1

## 2022-09-22 NOTE — Assessment & Plan Note (Addendum)
Replaced during hospital course 

## 2022-09-22 NOTE — Assessment & Plan Note (Addendum)
No further episodes.  Heparin drip.  Will need to convert back to oral blood thinner at some point.

## 2022-09-22 NOTE — Assessment & Plan Note (Addendum)
Patient has a picture of hepatic encephalopathy without any diagnosis of cirrhosis.  Concern of cardiohepatic.  Patient on lactulose and Xifaxan.

## 2022-09-22 NOTE — Progress Notes (Signed)
Pt lethargic ammonia levels 135. Was able to give lactulose with constant supervision. Crushed scheduled metoprolol . Unable to crush pt potassium or give colace due to lethargy pt unable to take whole. Pt responds to verbal stimuli and nods appropriately. Provider Foust notified, see new orders. Will continue to monitor , call bell within reach.

## 2022-09-22 NOTE — Progress Notes (Signed)
Heard pt coughing from hall way went to pt's room pt had moderate amount of bloody sputum on gown with a notable clot present , Pt states that she coughed the blood up. Provider Foust notified and came to bedside, see new orders . Vitals stable . Will continue to monitor call bell within reach.

## 2022-09-22 NOTE — Plan of Care (Signed)

## 2022-09-22 NOTE — TOC Progression Note (Addendum)
Transition of Care [facility]) - Progression Note    Patient Details  Name: Chelsea Ramsey MRN: [id number] Date of Birth: 1966-05-30  Transition of Care [facility]) CM/SW [place], LCSW Phone Number: 09/22/2022, 12:02 PM  Clinical Narrative:   Patient not fully oriented so called her mother. She is agreeable to SNF recommendations. First preference is [facility]. Left message for admissions coordinator asking her to review.  12:46 pm: [facility] is not in network with [employer]. Expanded search to other counties.  Expected Discharge Plan and Services                                                 Social Determinants of Health (SDOH) Interventions    Readmission Risk Interventions     No data to display

## 2022-09-22 NOTE — Progress Notes (Signed)
Heard pt coughing went to assess pt and noted that she had bloody drainage on her left nair and moderate amounts on gown . Pt is on nasal cannula. Pinched bilateral nair with pt leaning forward to stop nose bleed. Nose bleed stopped after a few minutes. Will continue to monitor, call bell within reach.

## 2022-09-22 NOTE — Progress Notes (Signed)
GI Inpatient Follow-up Note  Subjective:  Patient seen and somnolent. Reconsulted for altered mental status.  Scheduled Inpatient Medications:   acidophilus  1 capsule Oral Daily   allopurinol  100 mg Oral Daily   docusate sodium  100 mg Oral BID   furosemide  40 mg Intravenous Q12H   gabapentin  300 mg Oral BID   lactulose  30 g Oral TID   lactulose  300 mL Rectal BID   metoprolol tartrate  25 mg Oral TID   midodrine  10 mg Oral TID WC   pantoprazole (PROTONIX) IV  40 mg Intravenous Q12H   potassium chloride  40 mEq Oral BID   sodium chloride flush  3 mL Intravenous Q12H   sodium chloride flush  3 mL Intravenous Q12H    Continuous Inpatient Infusions:    sodium chloride      PRN Inpatient Medications:  sodium chloride, acetaminophen **OR** acetaminophen, albuterol, fentaNYL (SUBLIMAZE) injection, metoprolol tartrate, nitroGLYCERIN, oxyCODONE, oxymetazoline, prochlorperazine, sodium chloride flush, traZODone  Review of Systems:  Unable to assess due to mental status   Physical Examination: BP 101/75 (BP Location: Right Wrist)   Pulse 92   Temp 98.4 F (36.9 C) (Oral)   Resp (!) 22   Ht '5\' 4"'$  (1.626 m)   Wt 110.4 kg   LMP 11/08/2009 Comment: menopause  SpO2 96%   BMI 41.78 kg/m  Gen: NAD HEENT: PEERLA, EOMI, Neck: bounding carotid pulses Chest: No respiratory distress Abd: soft, non-distended Ext: no edema, warm extremities Skin: no rash or lesions noted Lymph: no LAD  Data: Lab Results  Component Value Date   WBC 5.8 09/22/2022   HGB 9.2 (L) 09/22/2022   HCT 33.4 (L) 09/22/2022   MCV 79.9 (L) 09/22/2022   PLT 172 09/22/2022   Recent Labs  Lab 09/20/22 0317 09/21/22 0555 09/22/22 0019  HGB 8.9* 9.0* 9.2*   Lab Results  Component Value Date   NA 141 09/22/2022   K 4.1 09/22/2022   CL 99 09/22/2022   CO2 30 09/22/2022   BUN 36 (H) 09/22/2022   CREATININE 1.66 (H) 09/22/2022   GLU 79 08/21/2014   Lab Results  Component Value Date   ALT 28  09/22/2022   AST 65 (H) 09/22/2022   GGT 33 09/17/2022   ALKPHOS 69 09/22/2022   BILITOT 8.8 (H) 09/22/2022   Recent Labs  Lab 09/22/22 0019  APTT 45*  INR 3.6*   Assessment/Plan: Ms. Bordelon is a 56 y.o. lady with HFrEF (EF 20-25%, artifact due to a.fib?) and severe diastolic dysfunction as well, pulmonary hypertension (no right heart cath noted), and chronic respiratory failure on home O2 who presented with heart failure and elevated T. Bilirubin ( 50/50 conjugated and unconjugated) with abnormality attributed to chronic hepatic congestion. We have been re-consulted due to mental status change in setting of elevated ammonia. Elevated ammonia has been present since admission. In acute liver failure the level is typically in the thousands. Prior to this episode she had no biochemical evidence of cirrhosis but likely has a component of NAFLD  Recommendations:  # AMS - broad differential in this medically complicated lady. Could have component of HE but other possibilities need to be investigated. She did have an elevated pCO2 on abg so this should be trended but not quite in the level that should cause hypercapnic respiratory failure in someone with chronic respiratory failure. She has been getting regular opioids as well. - will add xifaxin if able to take PO -  continue lactulose, avoid PO if altered - will discontinue opioids - consider repeat abg - consider infectious work-up  # Hyperbilirubinemia - agree with initial diagnosis of hepatic congestion which is made more prominent by likely NAFLD. Recent EGD was unremarkable. The bilirubin is 50% conjugated and 50% unconjugated which is typical of passive congestion. She has visibly elevated JVD consistent with elevated right heart pressure. 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 Appreciate cardiology assistance with this complex case. - would recommend daily CMP and INR - management of heart failure per  cardiology - will order IV vitamin K x 1 as elevated INR likely with malnutrition component  Please call with any questions or concerns.  Raylene Miyamoto MD, MPH [place]

## 2022-09-22 NOTE — Progress Notes (Signed)
Progress Note   Patient: Chelsea Ramsey UYQ:[id number] DOB: 03-31-1966 DOA: 09/15/2022     6 DOS: the patient was seen and examined on 09/22/2022   Brief hospital course: Taken from prior notes.  Patient is 56 year old female with DM, pulm HTN, COPD, CHF, GERD, OSA was seen by PCP on day of admission for not feeling well and abd pain for about a month. Patient complained of pain in RUQ and right flank with nausea.   abnormal outpatient labs showing AKI.   In ED, her labwork confirmed AKI with Cr 3.54, total bilirubin of 9.6.  AST mildly elevated to 57, ALT 35, Alk Phos 80.  U/S was done which showed gallbladder wall thickening to 5 mm with pericholecystic fluid, suspicious for acute cholecystitis.  CBD per U/S was 3 mm.   Surgery was consulted, recommended MRCP - MRCP showed biliary sludge, GB mild edematous. No biliary tract dilation to suggest obstruction.  No choledocholithiasis. 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  Also found to have acute systolic on chronic diastolic heart failure.  Echocardiogram shows EF of 20 to 25% with grade 3 diastolic dysfunction, right ventricular pressure and volume overload.  Prior echo done in December 2019 with a EF of 50 to 55%.  She was started on IV diuresis.  As blood pressure remained soft which interfere with other medical management, she was started on midodrine.  Remained in A-fib with intermittently going in RVR.  Missed Cardizem doses yesterday due to softer blood pressure.  Patient was on heparin infusion for the past 5 days, do not see any plan for cardiac cath by cardiology.  No clear recommendations. CMP remained with elevated T. bili at 9.1, she continued to have  upper abdominal pain. Concern of cardiohepatic with hepatic congestion secondary to heart failure. Secure chat with Dr. Humphrey Rolls and we will discontinue heparin infusion today and restart her home Xarelto.  Patient will get benefit from ischemic work-up with this new HFrEF. Norco switched with oxycodone immediate release to see if that will better serve her pain. No recorded urinary output as she was receiving IV diuresis-message sent to nursing staff. Potassium at 3.1 which is being repleted.  Renal functions continue to improve slowly.  10/23: Discussed with Dr. Nehemiah Massed on chat, there is no need for immediate ischemic work-up. T. bili started improving, at 8.5 today.  Rest of the labs improved.  Renal function stable. Pain with better control after changing to oxycodone.  Awaiting disposition.  10/24: Patient became more lethargic and somnolent in the evening, ABG and ammonia levels were checked and found to have CO2 of 59 and ammonia of 135.  Had an episode of self-limiting epistaxis followed by coughing up some blood overnight.  Patient is on Xarelto for atrial flutter, which is being held. Lactulose enema was added as she is unable to take much p.o. Remained very lethargic and somnolent this morning.  CMP with improvement in creatinine, T. bili at 8.8 with INR of 3.6.  Concern of hepatic failure, another message sent to GI.  Recent liver ultrasound with hepatic steatosis and portal hypertension, no cirrhosis.  VBG was mostly within normal limit except mildly elevated bicarb. Repeat ammonia levels with some improvement to 65. Currently unable to swallow as pocketing in her mouth, able to  answer orientation questions appropriately but response was very slow. Palliative care consult was also placed.   Assessment and Plan: * Acute on chronic HFrEF (heart failure with reduced ejection fraction) ([place]) Patient was found to have new onset HFrEF with EF of 20 to 25% and grade 3 diastolic dysfunction.   Had low normal EF on prior echocardiogram done in 2020. Cardiology was consulted-no plan for more ischemic work-up during current hospitalization as she has clean coronaries in 2019.  Renal function stable with some concern of worsening hepatic function.  T. bili seems stable, INR at 3.6 and elevated ammonia levels. -Continue with IV Lasix 40 mg twice daily. -Need strict intake and output -Daily weight and CMP -Continue to monitor  Epistaxis Patient has an episode of epistaxis overnight followed by a small hemoptysis. INR this morning 3.6.  Hemoglobin stable -Holding Xarelto -Continue to monitor  Acute metabolic encephalopathy Patient became more somnolent and lethargic, although able to answer some questions appropriately.  Concern of hepatic encephalopathy with markedly elevated ammonia.  Recent liver ultrasound with hepatic steatosis and elevated portal pressures.  INR elevated at 3.6 and T. bili of 8.8.  Liver enzymes mostly within normal limit.  VBG this morning within normal limit except mildly elevated bicarb. -Starting her on lactulose -Frequent neurochecks -Continue to monitor  Abdominal pain Pt c/o right sided abd pain for one month.  Work-up so far with MRCP shows biliary sludge, mildly edematous gallbladder, no biliary tract dilation to suggest obstruction.  No choledocholithiasis. 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  T. bili remained stable around 8.8, Her Norco was switched with oxycodone which seems helping. -Continue with oxycodone   Elevated LFTs Most likely secondary to congestive hepatopathy, also concern of [place] liver disease.  Liver enzymes improved with stable T. bili at 9.1. GI and general surgery signed off. Patient is being diuresed. -Continue to monitor  AKI (acute kidney injury) [facility]) Patient presented with creatinine of 3.54 with baseline around 1. Slowly improving.  Concern of  cardiorenal -Monitor renal function while she is being diuresed -Avoid renal toxins  HTN (hypertension) Blood pressure remains soft which interfere with necessary medical management which include diuretics and Cardizem for rate control. She was started on midodrine with some improvement. -Continue with midodrine -Monitor closely when she is being diuresed with Lasix and getting Cardizem, amiodarone allergies were noted.    Iron deficiency anemia Pt has h/o IDA / is currently anemic and is on xarelto for a/fib. In afib rvr on ekg.  Pt being followed by Dr.vanga for her IDA and Eval for GI loss. GI eval showed: Endoscopy Shows Studies Complaint Capsule History Speak, Normal Small Bowel Transit, Scattered Small Bowel Lymphangiectasia's, No Source of Iron Deficiency Anemia Identified - 08/18/2022. Endoscopy shows erythematous  mucosa in the antrum, normal duodenal bulb and second portion of duodenum, colonoscopy showed melanosis coli but was otherwise normal 06/29/2022.  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 Currently hemoglobin stable. Can-continue to monitor -Continue with iron supplement-restarting home meds.  A-fib (HCC) Remained in A-fib with going in and out of RVR.  Concern of atrial flutter -Continue with Cardizem, beta-blocker . -Xarelto is being held due to recent episode of epistaxis and mild hemoptysis and INR of 3.6, concern of hepatic dysfunction. -Patient will need cardioversion as outpatient per cardiology  Tobacco dependence -Nicotine patch.   History of cocaine use UDS done on 09/16/2022 were positive for  benzodiazepines, opioids and tricyclic.   Chronic low back pain (Primary Area of Pain) (Bilateral) w/ sciatica (Left) Seems to have an history of chronic pain syndrome and fibromyalgia. She was on oxycodone and Neurontin at home. -Restarting oxycodone and Neurontin  Class 3 severe obesity  with body mass index (BMI) of 45.0 to 49.9 in adult [facility]) Estimated body mass index is 42.76 kg/m as calculated from the following:   Height as of this encounter: 5' 4"  (1.626 m).   Weight as of this encounter: 113 kg.   -This will complicate overall prognosis  Hypokalemia Replace potassium as needed and monitor  Hypomagnesemia Magnesium of 1.6. -Replete magnesium and monitor  Moderate to severe pulmonary hypertension (HCC) Chronic respiratory failure. Patient is on 3 to 4 L of oxygen at home.  Currently at baseline. -Continue to monitor   Subjective: Patient was seen and examined today.  Appears very lethargic and somnolent but able to answer questions appropriately.  Physical Exam: Vitals:   09/21/22 2354 09/22/22 0436 09/22/22 0500 09/22/22 0830  BP: 114/80 98/68  109/82  Pulse: 80 84  94  Resp: 17 18  20   Temp: 97.8 F (36.6 C) 98.3 F (36.8 C)  98 F (36.7 C)  TempSrc:    Oral  SpO2: 97% 97%  96%  Weight:   110.4 kg   Height:       General.  Lethargic and somnolent lady, in no acute distress. Pulmonary.  Lungs clear bilaterally, normal respiratory effort. CV.  Irregularly irregular. Abdomen.  Soft, nontender, nondistended, BS positive. CNS.  Somnolent but oriented. Extremities.  1+ LE edema, no cyanosis, pulses intact and symmetrical. Psychiatry.  Judgment and insight appears impaired  Data Reviewed: Prior data reviewed  Family Communication: Discussed with mother on phone.  Disposition: Status is: Inpatient Remains inpatient appropriate because: Severity of illness   Planned Discharge Destination: Home with Home Health   Time spent: 52 minutes  This record has been created using Systems analyst. Errors have been sought and corrected,but may not always be located. Such creation errors do not reflect on the standard of care.  Author: Lorella Nimrod, MD 09/22/2022 12:39 PM  For on call review [email or web address].

## 2022-09-22 NOTE — Assessment & Plan Note (Addendum)
Patient was found to have new onset HFrEF with EF of 20 to 25% and grade 3 diastolic dysfunction.  Cardiology was consulted-no plan for more ischemic work-up during current hospitalization as she has clean coronaries in 2019. -Switch Lasix over to 60 mg p.o. daily.  On Zaroxolyn 3 times a week.  Patient on metoprolol, spironolactone

## 2022-09-22 NOTE — Assessment & Plan Note (Addendum)
Pt c/o right sided abd pain for one month.  Work-up so far with MRCP shows biliary sludge, mildly edematous gallbladder, no biliary tract dilation to suggest obstruction.  No choledocholithiasis.

## 2022-09-22 NOTE — NC FL2 (Signed)
[place] LEVEL OF CARE SCREENING TOOL     IDENTIFICATION  Patient Name: Chelsea Ramsey Birthdate: [birth year] Sex: female Admission Date (Current Location): 09/15/2022  [facility] and [place] Number:  Engineering geologist and Address:  [facility], [address] [place], [place] [address]      Provider Number: [id number]  Attending Physician Name and Address:  Lorella Nimrod, MD  Relative Name and Phone Number:       Current Level of Care: Hospital Recommended Level of Care: [place] Prior Approval Number:    Date Approved/Denied:   PASRR Number: [id number]  Discharge Plan: SNF    Current Diagnoses: Patient Active Problem List   Diagnosis Date Noted   Acute on chronic HFrEF (heart failure with reduced ejection fraction) ([place]) 09/20/2022   Hypokalemia 09/20/2022   Symptomatic anemia    Abdominal pain 09/16/2022   AKI (acute kidney injury) ([place]) 09/16/2022   Elevated LFTs 09/16/2022   Hyperbilirubinemia    Iron deficiency anemia    Non compliance w medication regimen 01/15/2020   Pain medication agreement broken 09/73/5329   History of illicit drug use 92/42/6834   Misuse of prescription only drugs 01/15/2020   History of gastric ulcer 01/08/2020   Stomach irritation 01/08/2020   Esophageal dysphagia 01/08/2020   Class 3 severe obesity due to excess calories with serious comorbidity in adult [facility]) 01/08/2020   Pharmacologic therapy 12/14/2019   Prediabetes 09/27/2019   Numbness 08/03/2019   Headache disorder 08/03/2019   Elevated uric acid in blood 02/08/2019   Hyperglycemia 02/08/2019   Secondary osteoarthritis of multiple sites 02/08/2019   Osteoarthritis of knees (Bilateral) (R>L) 02/08/2019   Chronic gout of multiple sites, unspecified cause 02/08/2019   Chronic musculoskeletal pain (Fourth Area of Pain) 02/08/2019   Epidural lipomatosis 02/08/2019   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/08/2019    History of cocaine use 01/18/2019   Chronic gout involving toe of foot, unspecified cause (Left) 01/18/2019   Chronic hip pain (Left) 01/18/2019   Osteoarthritis of sacroiliac joints (Bilateral) 01/18/2019   Other specified dorsopathies, sacral and sacrococcygeal region 01/18/2019   Lumbar facet arthropathy (Bilateral) 01/05/2019   Melena 01/03/2019   Acute esophagogastric ulcer 01/03/2019   Spondylosis without myelopathy or radiculopathy, lumbar region 12/27/2018   Lumbar facet syndrome (Bilateral) 12/27/2018   DDD (degenerative disc disease), lumbar 12/27/2018   Chronic low back pain (Primary Area of Pain) (Bilateral) (L>R) w/o sciatica 12/27/2018   Abnormal MRI, lumbar spine (01/05/2018) 12/27/2018   Elevated sed rate 12/19/2018   Elevated C-reactive protein 12/19/2018   Diplopia 12/14/2018   Dizziness 12/14/2018   Headache, chronic daily 12/14/2018   Numbness and tingling of both feet 12/14/2018   CHF (congestive heart failure) ([place]) 12/04/2018   Class 3 severe obesity with body mass index (BMI) of 45.0 to 49.9 in adult ([place]) 11/14/2018   Chronic anticoagulation (XARELTO) 11/14/2018   Vitamin D deficiency 11/14/2018   Chronic knee pain after total replacement (Fifth Area of Pain) (Right) 11/14/2018   Chest pain with moderate risk of acute coronary syndrome 10/17/2018   Chronic low back pain (Primary Area of Pain) (Bilateral) w/ sciatica (Left) 10/10/2018   Chronic lower extremity pain (Secondary Area of Pain) (Left) 10/10/2018   Sternal pain (Tertiary Area of Pain) 10/10/2018   Fibromyalgia (Fourth Area of Pain) 10/10/2018   Chronic knee pain (Right) 10/10/2018   Chronic sacroiliac joint pain (Bilateral) (L>R) 10/10/2018   Chronic pain syndrome 10/10/2018   Long term  current use of opiate analgesic 10/10/2018   Screening for colon cancer 10/10/2018   Disorder of skeletal system 10/10/2018   Problems influencing health status 10/10/2018   HTN (hypertension) 07/26/2018   S/P  MVR (mitral valve replacement) 06/20/2018   Shortness of breath 05/27/2018   GIB (gastrointestinal bleeding) 05/22/2018   A-fib ([place]) 04/22/2018   Insomnia 12/25/2016   Osteoarthritis of knee (Right) 09/04/2016   Tobacco dependence 05/08/2015   GAD (generalized anxiety disorder) 03/13/2015   Moderate to severe pulmonary hypertension ([place]) 03/13/2015   Rheumatic heart disease, unspecified 03/13/2015   Chronic diastolic heart failure ([place]) 02/25/2015   Depression 02/25/2015   History of substance abuse ([place]) 02/25/2015    Orientation RESPIRATION BLADDER Height & Weight     Self, Place  O2 (Nasal Cannula 2 L) Continent, External catheter Weight: 243 lb 6.2 oz (110.4 kg) Height:  '5\' 4"'$  (162.6 cm)  BEHAVIORAL SYMPTOMS/MOOD NEUROLOGICAL BOWEL NUTRITION STATUS   (None)  (None) Continent Diet (Heart healthy)  AMBULATORY STATUS COMMUNICATION OF NEEDS Skin   Extensive Assist Verbally Bruising                       Personal Care Assistance Level of Assistance  Bathing, Feeding, Dressing Bathing Assistance: Maximum assistance Feeding assistance: Limited assistance Dressing Assistance: Maximum assistance     Functional Limitations Info  Sight, Hearing, Speech Sight Info: Adequate Hearing Info: Adequate Speech Info: Adequate    SPECIAL CARE FACTORS FREQUENCY  PT (By licensed PT), OT (By licensed OT)     PT Frequency: 5 x week OT Frequency: 5 x week            Contractures Contractures Info: Not present    Additional Factors Info  Code Status, Allergies Code Status Info: Full code Allergies Info: Amiodarone, Aspirin, Flexeril (Cyclobenzaprine), Trazamine (Trazodone & Diet Manage Prod), Codeine, Tramadol           Current Medications (09/22/2022):  This is the current hospital active medication list Current Facility-Administered Medications  Medication Dose Route Frequency Provider Last Rate Last Admin   0.9 %  sodium chloride infusion  250 mL Intravenous PRN Rai,  Ripudeep K, MD       acetaminophen (TYLENOL) tablet 650 mg  650 mg Oral Q6H PRN Para Skeans, MD       Or   acetaminophen (TYLENOL) suppository 650 mg  650 mg Rectal Q6H PRN Para Skeans, MD       acidophilus (RISAQUAD) capsule 1 capsule  1 capsule Oral Daily Lorella Nimrod, MD   1 capsule at 09/21/22 1012   albuterol (PROVENTIL) (2.5 MG/3ML) 0.083% nebulizer solution 2.5 mg  2.5 mg Nebulization Q2H PRN Rai, Ripudeep K, MD       allopurinol (ZYLOPRIM) tablet 100 mg  100 mg Oral Daily Florina Ou V, MD   100 mg at 09/22/22 1011   docusate sodium (COLACE) capsule 100 mg  100 mg Oral BID Florina Ou V, MD   100 mg at 09/21/22 1013   fentaNYL (SUBLIMAZE) injection 25 mcg  25 mcg Intravenous Q3H PRN Rai, Ripudeep K, MD   25 mcg at 09/18/22 0415   furosemide (LASIX) injection 40 mg  40 mg Intravenous Q12H [place], Dwayne D, MD   40 mg at 09/22/22 0550   gabapentin (NEURONTIN) tablet 300 mg  300 mg Oral BID Lorella Nimrod, MD   300 mg at 09/22/22 1011   lactulose (CHRONULAC) 10 GM/15ML solution 30 g  30 g Oral  TID Lorella Nimrod, MD   30 g at 09/21/22 2134   lactulose (CHRONULAC) enema 200 gm  300 mL Rectal BID Lorella Nimrod, MD   300 mL at 09/22/22 1107   metoprolol tartrate (LOPRESSOR) injection 2.5 mg  2.5 mg Intravenous Q6H PRN Foust, Katy L, NP   2.5 mg at 09/18/22 1125   metoprolol tartrate (LOPRESSOR) tablet 25 mg  25 mg Oral TID Tristan Schroeder, PA-C   25 mg at 09/22/22 1011   midodrine (PROAMATINE) tablet 10 mg  10 mg Oral TID WC Rai, Ripudeep K, MD   10 mg at 09/22/22 1011   nitroGLYCERIN (NITROSTAT) SL tablet 0.4 mg  0.4 mg Sublingual Q5 min PRN Para Skeans, MD       oxyCODONE (Oxy IR/ROXICODONE) immediate release tablet 10 mg  10 mg Oral Q4H PRN Lorella Nimrod, MD   10 mg at 09/21/22 1013   oxymetazoline (AFRIN) 0.05 % nasal spray 2 spray  2 spray Each Nare PRN Foust, Katy L, NP       pantoprazole (PROTONIX) injection 40 mg  40 mg Intravenous Q12H Florina Ou V, MD   40 mg at 09/22/22  1011   potassium chloride (KLOR-CON) packet 40 mEq  40 mEq Oral BID Foust, Katy L, NP   40 mEq at 09/22/22 0228   prochlorperazine (COMPAZINE) injection 10 mg  10 mg Intravenous Q6H PRN Sharion Settler, NP   10 mg at 09/20/22 1434   sodium chloride flush (NS) 0.9 % injection 3 mL  3 mL Intravenous Q12H Florina Ou V, MD   3 mL at 09/22/22 0821   sodium chloride flush (NS) 0.9 % injection 3 mL  3 mL Intravenous Q12H Rai, Ripudeep K, MD   3 mL at 09/22/22 1638   sodium chloride flush (NS) 0.9 % injection 3 mL  3 mL Intravenous PRN Rai, Ripudeep K, MD       traZODone (DESYREL) tablet 25 mg  25 mg Oral QHS PRN Sharion Settler, NP   25 mg at 09/19/22 2342     Discharge Medications: Please see discharge summary for a list of discharge medications.  Relevant Imaging Results:  Relevant Lab Results:   Additional Information SS#: [id number]  Candie Chroman, LCSW

## 2022-09-22 NOTE — Progress Notes (Signed)
   Heart Failure Nurse Navigator Note  Spoke with patient's nurse, Amy due to the patient being very somnolent, has a high ammonia level felt not to be a good candidate for heart failure teaching.  We will continue to follow along.  Pricilla Riffle RN CHFN

## 2022-09-22 NOTE — Progress Notes (Signed)
Occupational Therapy Treatment Patient Details Name: Chelsea Ramsey MRN: [id number] DOB: 1966-02-22 Today's Date: 09/22/2022   History of present illness Pt is a 56 yo female seen by PCP secondary to not feeling well with abd pain for about a month. MD assessment includes: Right sided abdominal pain, transaminitis, hyperbilirubinemia, AKI, and iron deficiency anemia.   OT comments  Patient in bed upon arrival, very lethargic but agreeable to OT treatment. Patient observed with food pocketed in mouth required max A for oral care and suction (RN notified for safety). Hand-over-hand total A for grooming tasks (washing face). Patient also required max A +2 for bed mobility to sit EOB. Patient with poor sitting balance, but able to hold self up intermittently. Max A to place patient back in supine. Patient left in bed with call bell in reach, bed alarm set, and all needs met.    Recommendations for follow up therapy are one component of a multi-disciplinary discharge planning process, led by the attending physician.  Recommendations may be updated based on patient status, additional functional criteria and insurance authorization.    Follow Up Recommendations  Skilled nursing-short term rehab (<3 hours/day)    Assistance Recommended at Discharge Frequent or constant Supervision/Assistance  Patient can return home with the following  A lot of help with walking and/or transfers;A lot of help with bathing/dressing/bathroom;Help with stairs or ramp for entrance;Assist for transportation;Direct supervision/assist for medications management;Direct supervision/assist for financial management;Assistance with cooking/housework   Equipment Recommendations  Other (comment) (Defer to next venue of care.)       Precautions / Restrictions Precautions Precautions: Fall Restrictions Weight Bearing Restrictions: No       Mobility Bed Mobility   Bed Mobility: Supine to Sit, Sit to Supine      Supine to sit: Max assist, HOB elevated, +2 for physical assistance Sit to supine: Max assist, +2 for physical assistance   General bed mobility comments: Patient attempted to assist, but required Max A for BLE and trunk control    Transfers                   General transfer comment: Unable/unsafe to attempt; pt unable to maintain static sitting position at the EOB secondary to posterior instability     Balance Overall balance assessment: Needs assistance Sitting-balance support: No upper extremity supported, Feet supported Sitting balance-Leahy Scale: Poor                                     ADL either performed or assessed with clinical judgement   ADL       Grooming: Wash/dry face;Oral care;Total assistance Grooming Details (indicate cue type and reason): Patient pocketing food in mouth requiring oral care and suction, RN notified for safety.                                    Extremity/Trunk Assessment Upper Extremity Assessment Upper Extremity Assessment: RUE deficits/detail;Generalized weakness   Lower Extremity Assessment Lower Extremity Assessment: Generalized weakness        Vision Patient Visual Report: No change from baseline            Cognition Arousal/Alertness: Lethargic Behavior During Therapy: Flat affect Overall Cognitive Status: No family/caregiver present to determine baseline cognitive functioning  General Comments: Very lethargic this session with difficulty formulating words and following commands even when awake                   Pertinent Vitals/ Pain       Pain Assessment Pain Assessment: Faces Faces Pain Scale: Hurts little more Pain Location: chest Pain Descriptors / Indicators: Discomfort Pain Intervention(s): Limited activity within patient's tolerance, Repositioned, Monitored during session   Frequency  Min 2X/week        Progress  Toward Goals  OT Goals(current goals can now be found in the care plan section)  Progress towards OT goals: Progressing toward goals  Acute Rehab OT Goals Patient Stated Goal: to return home. OT Goal Formulation: With patient Time For Goal Achievement: 10/02/22 Potential to Achieve Goals: [place] Discharge plan remains appropriate       AM-PAC OT "6 Clicks" Daily Activity     Outcome Measure   Help from another person eating meals?: A Little Help from another person taking care of personal grooming?: A Little Help from another person toileting, which includes using toliet, bedpan, or urinal?: A Lot Help from another person bathing (including washing, rinsing, drying)?: A Lot Help from another person to put on and taking off regular upper body clothing?: A Little Help from another person to put on and taking off regular lower body clothing?: A Lot 6 Click Score: 15    End of Session Equipment Utilized During Treatment: Oxygen  OT Visit Diagnosis: Unsteadiness on feet (R26.81);Repeated falls (R29.6);Muscle weakness (generalized) (M62.81)   Activity Tolerance Patient limited by lethargy;Patient tolerated treatment well   Patient Left in bed;with call bell/phone within reach;with bed alarm set   Nurse Communication Mobility status        Time: 7014-1030 OT Time Calculation (min): 25 min  Charges: OT General Charges $OT Visit: 1 Visit OT Treatments $Self Care/Home Management : 8-22 mins $Therapeutic Activity: 8-22 mins     Brigit Doke, OTS 09/22/2022, 12:38 PM

## 2022-09-22 NOTE — Assessment & Plan Note (Addendum)
Remained in A-fib with going in and out of RVR.  Currently on metoprolol.  Switching back to Xarelto today

## 2022-09-22 NOTE — Assessment & Plan Note (Addendum)
Continue replacement 

## 2022-09-22 NOTE — Progress Notes (Signed)
Patient remains very lethargic.  Sent secure chat to MD to give an update and STAT ABG ordered.

## 2022-09-22 NOTE — Progress Notes (Addendum)
[place] NOTE       Patient ID: Chelsea Ramsey MRN: [id number] DOB/AGE: 1966-06-13 56 y.o.  Admit date: 09/15/2022 Referring Physician Dr. Estill Cotta Primary Physician Dr. Gracy Racer Primary Cardiologist Dr. 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  Labs on admission showed acute renal failure, transaminitis, and hyperbilirubinemia, ultimately attributed to volume overload from her heart failure exacerbation.  Her echo revealed a newly reduced ejection fraction as above and her hospital course has been complicated by atrial fibrillation with RVR.  Interval history: -cross cover notes reviewed re: hempotysis & epistaxis, INR elevated at 3.4, Hgb with slow upward trend at 9.2 (9.0 on 10/23) -renal function continues to improve with diuresis -ammonia elevated overnight to 135 -Remains rather somnolent, takes a few moments of speaking with her before she is interactive.  Denies abdominal pain.  Primarily fixated on needing to urinate and requires significant reassurance and encouragement from myself and her bedside nurse to use the pure wick.   Past Medical History:  Diagnosis Date   Acute drug-induced gout of left foot 03/01/2018   Last Assessment & Plan:  Likely at least partially brought on by diuresis.  Needs to continue to diurese  Will push hydration Stop allopurinol given initiation during acute flare may worsen this, re-broach this when asymptomatic Avoid nsaids given  stomach pain Trial colchicine Add acetaminophen Ice, elevate, rest   Allergy    seasonal   Anxiety    Arthritis    Right Knee   Asthma    CHF (congestive heart failure) (HCC)    COPD (chronic obstructive pulmonary disease) (HCC)    Coronary artery disease    Leaky heart valve   Diabetes mellitus without complication (HCC)    Dysrhythmia    Fibromyalgia    GERD (gastroesophageal reflux disease)    Hypertension    Pneumonia    PUD (peptic ulcer disease)    Pulmonary HTN (HCC)    Rheumatic fever/heart disease    Sleep apnea     Past Surgical History:  Procedure Laterality Date   ADENOIDECTOMY     COLONOSCOPY WITH PROPOFOL N/A 11/06/2019   Procedure: COLONOSCOPY WITH PROPOFOL;  Surgeon: Virgel Manifold, MD;  Location: [facility] ENDOSCOPY;  Service: Endoscopy;  Laterality: N/A;  2 day prep    COLONOSCOPY WITH PROPOFOL N/A 06/29/2022   Procedure: COLONOSCOPY WITH PROPOFOL;  Surgeon: Lin Landsman, MD;  Location: [facility] ENDOSCOPY;  Service: Gastroenterology;  Laterality: N/A;   CORONARY ARTERY BYPASS GRAFT     June 27 2018   ESOPHAGOGASTRODUODENOSCOPY (EGD) WITH PROPOFOL N/A 05/25/2018   Procedure: ESOPHAGOGASTRODUODENOSCOPY (EGD) WITH PROPOFOL;  Surgeon: Lucilla Lame, MD;  Location: [facility] ENDOSCOPY;  Service: Endoscopy;  Laterality: N/A;   ESOPHAGOGASTRODUODENOSCOPY (EGD) WITH PROPOFOL N/A 11/06/2019   Procedure: ESOPHAGOGASTRODUODENOSCOPY (EGD) WITH PROPOFOL;  Surgeon: Virgel Manifold, MD;  Location: [facility] ENDOSCOPY;  Service: Endoscopy;  Laterality: N/A;   ESOPHAGOGASTRODUODENOSCOPY (EGD) WITH PROPOFOL N/A 06/29/2022   Procedure: ESOPHAGOGASTRODUODENOSCOPY (EGD) WITH PROPOFOL;  Surgeon: Lin Landsman, MD;  Location: [place];  Service: Gastroenterology;  Laterality: N/A;   GIVENS CAPSULE STUDY N/A 07/22/2022   Procedure: GIVENS CAPSULE STUDY;  Surgeon: Lin Landsman, MD;  Location: [facility] ENDOSCOPY;  Service: Gastroenterology;  Laterality: N/A;   MITRAL VALVE  REPLACEMENT     MULTIPLE TOOTH EXTRACTIONS     TONSILLECTOMY     TOTAL KNEE ARTHROPLASTY Right 09/04/2016   Procedure: TOTAL KNEE ARTHROPLASTY; with lateral release;  Surgeon: Earlie Server, MD;  Location: [place];  Service: Orthopedics;  Laterality: Right;    Medications Prior to Admission  Medication Sig Dispense Refill Last Dose   albuterol (VENTOLIN HFA) 108 (90 Base) MCG/ACT inhaler Inhale 1-2 puffs into the lungs every 6 (six) hours as needed for wheezing or shortness of breath.   Unknown at PRN   allopurinol (ZYLOPRIM) 100 MG tablet Take 1 tablet (100 mg total) by mouth 2 (two) times daily. 60 tablet 5 Past Week at Unknown   Cholecalciferol (VITAMIN D) 50 MCG (2000 UT) CAPS Take 1 capsule (2,000 Units total) by mouth daily. 30 capsule 5    colchicine 0.6 MG tablet Take 1 tablet (0.6 mg total) by mouth daily. As directed 30 tablet 5 Unknown at PRN   docusate sodium (COLACE) 100 MG capsule Take 100 mg by mouth 2 (two) times daily as needed for mild constipation or moderate constipation.   Unknown at PRN   empagliflozin (JARDIANCE) 10 MG TABS tablet Take 10 mg by mouth daily.   Past Week at Unknown   furosemide (LASIX) 40 MG tablet TAKE 2 TABLETS BY MOUTH IN THE MORNING AND 1 TABLET AT NIGHT (Patient taking differently: 40 mg 2 (two) times daily.) 90 tablet 5 Unknown at Unknown   gabapentin (NEURONTIN) 600 MG tablet Take 0.5 tablets (300 mg total) by mouth 2 (two) times daily. (Patient taking differently: Take 600 mg by mouth 3 (three) times daily.) 60 tablet 0 Past Week at Unknown   ipratropium-albuterol (DUONEB) 0.5-2.5 (3) MG/3ML SOLN INHALE THE CONTENTS OF 1 VIAL(3MLS) VIA NEBULIZER 3 TIMES DAILY AS NEEDED 360 mL 1    metolazone (ZAROXOLYN) 2.5 MG tablet Take 5 mg by mouth 3 (three) times a week.   Past Week at Unknown   metoprolol tartrate (LOPRESSOR) 100 MG tablet Take 1 tablet (100 mg total) by mouth 2 (two) times daily. 60 tablet 3 Past Week at Unknown   nitroGLYCERIN (NITROSTAT) 0.4 MG  SL tablet Place 1 tablet (0.4 mg total) under the tongue every 5 (five) minutes as needed for chest pain. 30 tablet 12 Unknown at PRN   omeprazole (PRILOSEC) 40 MG capsule Take 40 mg by mouth daily.   Past Week at Unknown   oxyCODONE ER 9 MG C12A Take 9 mg by mouth 2 (two) times daily.   Past Week at Unknown   potassium chloride (KLOR-CON) 10 MEQ tablet Take 4 tablets (40 mEq total) by mouth 2 (two) times daily. And additional 73mq on metolazone days (Patient taking differently: Take 40 mEq by mouth See admin instructions. Take 4 tablets (454m) by mouth twice daily - take an additional 2 tablets (2033m by mouth when taking metolazone) 64 tablet 5 Past Week at Unknown   promethazine (PHENERGAN) 12.5 MG tablet Take 1 tablet (12.5 mg total) by mouth every 8 (eight) hours as needed for nausea or vomiting. (Patient taking differently: Take 12.5 mg by mouth 2 (two) times daily as needed for nausea or vomiting.) 30 tablet 0 Unknown at PRN   rivaroxaban (XARELTO) 20 MG TABS tablet Take 20 mg  by mouth daily with supper.   Past Week at Unknown   rosuvastatin (CRESTOR) 10 MG tablet Take 10 mg by mouth daily.   Past Week at Unknown   Iron-FA-B Cmp-C-Biot-Probiotic (FUSION PLUS) CAPS Take 1 capsule by mouth daily. (Patient not taking: Reported on 09/16/2022) 30 capsule 3 Not Taking   Social History   Socioeconomic History   Marital status: Married    Spouse name: Not on file   Number of children: Not on file   Years of education: Not on file   Highest education level: Not on file  Occupational History   Occupation: disabled  Tobacco Use   Smoking status: Every Day    Packs/day: 0.10    Types: Cigarettes   Smokeless tobacco: Never   Tobacco comments:    4/20 was not able to quit.  Still at 1-3 a day. She would like to wait to set a new quit date until we get back to class to have the extra in person support  Vaping Use   Vaping Use: Never used  Substance and Sexual Activity   Alcohol use: Never     Alcohol/week: 3.0 standard drinks of alcohol    Types: 3 Cans of beer per week    Comment: 16 oz per week   Drug use: Not Currently    Comment: Previous use of cocaine and marijuana last use 07/31/16    Sexual activity: Not Currently  Other Topics Concern   Not on file  Social History Narrative   Not on file   Social Determinants of Health   Financial Resource Strain: Low Risk  (10/02/2019)   Overall Financial Resource Strain (CARDIA)    Difficulty of Paying Living Expenses: Not hard at all  Food Insecurity: No Food Insecurity (09/18/2022)   Hunger Vital Sign    Worried About Running Out of Food in the Last Year: Never true    Ran Out of Food in the Last Year: Never true  Transportation Needs: No Transportation Needs (09/18/2022)   PRAPARE - Hydrologist (Medical): No    Lack of Transportation (Non-Medical): No  Physical Activity: Insufficiently Active (10/02/2019)   Exercise Vital Sign    Days of Exercise per Week: 7 days    Minutes of Exercise per Session: 20 min  Stress: No Stress Concern Present (10/02/2019)   [place]    Feeling of Stress : Not at all  Social Connections: Moderately Integrated (10/02/2019)   Social Connection and Isolation Panel [NHANES]    Frequency of Communication with Friends and Family: More than three times a week    Frequency of Social Gatherings with Friends and Family: More than three times a week    Attends Religious Services: 1 to 4 times per year    Active Member of [employer] or Organizations: Yes    Attends Archivist Meetings: 1 to 4 times per year    Marital Status: Never married  Intimate Partner Violence: Not At Risk (09/18/2022)   Humiliation, Afraid, Rape, and Kick questionnaire    Fear of Current or Ex-Partner: No    Emotionally Abused: No    Physically Abused: No    Sexually Abused: No    Family History  Problem Relation Age of Onset    COPD Mother    Cancer Mother        Bone   Asthma Mother    Rheum arthritis Mother  Congestive Heart Failure Father    Breast cancer Maternal Aunt      Vitals:   09/21/22 2132 09/21/22 2354 09/22/22 0436 09/22/22 0500  BP: 106/81 114/80 98/68   Pulse: 73 80 84   Resp: '18 17 18   '$ Temp:  97.8 F (36.6 C) 98.3 F (36.8 C)   TempSrc:      SpO2: 99% 97% 97%   Weight:    110.4 kg  Height:        PHYSICAL EXAM General: Chronically ill-appearing black female, well nourished, in no acute distress.  Laying on her left side in hospital bed without family at bedside. HEENT:  Normocephalic and atraumatic. Neck:  No JVD.  Lungs: Normal respiratory effort on oxygen by nasal cannula.  Decreased breath sounds bilaterally without appreciable crackles or wheezes.   Heart: Irregularly irregular with controlled rate. Normal S1 and S2 without gallops or murmurs.  Abdomen: Non-distended appearing with excess adiposity..  Msk: Normal strength and tone for age. Extremities: Warm and well perfused. No clubbing, cyanosis.  1+ bilateral lower extremity edema.  Neuro: Takes several moments before becoming alert and interactive Psych: Somnolent, is a very limited historian.  Labs: Basic Metabolic Panel: Recent Labs    09/21/22 0548 09/21/22 0555 09/22/22 0439  NA  --  137 141  K  --  3.7 4.1  CL  --  98 99  CO2  --  28 30  GLUCOSE  --  75 80  BUN  --  35* 36*  CREATININE  --  1.89* 1.66*  CALCIUM  --  8.6* 8.7*  MG 1.5*  --   --     Liver Function Tests: Recent Labs    09/21/22 0555 09/22/22 0439  AST 53* 65*  ALT 30 28  ALKPHOS 68 69  BILITOT 8.5* 8.8*  PROT 6.5 7.0  ALBUMIN 2.6* 2.7*    No results for input(s): "LIPASE", "AMYLASE" in the last 72 hours. CBC: Recent Labs    09/21/22 0555 09/22/22 0019  WBC 5.4 5.8  HGB 9.0* 9.2*  HCT 32.1* 33.4*  MCV 77.5* 79.9*  PLT 156 172    Cardiac Enzymes: No results for input(s): "CKTOTAL", "CKMB", "CKMBINDEX",  "TROPONINIHS" in the last 72 hours. BNP: No results for input(s): "BNP" in the last 72 hours. D-Dimer: No results for input(s): "DDIMER" in the last 72 hours. Hemoglobin A1C: No results for input(s): "HGBA1C" in the last 72 hours. Fasting Lipid Panel: No results for input(s): "CHOL", "HDL", "LDLCALC", "TRIG", "CHOLHDL", "LDLDIRECT" in the last 72 hours. Thyroid Function Tests: No results for input(s): "TSH", "T4TOTAL", "T3FREE", "THYROIDAB" in the last 72 hours.  Invalid input(s): "FREET3" Anemia Panel: No results for input(s): "VITAMINB12", "FOLATE", "FERRITIN", "TIBC", "IRON", "RETICCTPCT" in the last 72 hours.   Radiology: ECHOCARDIOGRAM COMPLETE  Result Date: 09/17/2022    ECHOCARDIOGRAM REPORT   Patient Name:   MALAN WERK Date of Exam: 09/17/2022 Medical Rec #:  [id number]                 Height:       64.0 in Accession #:    [id number]                Weight:       265.0 lb Date of Birth:  [birth year]                 BSA:          2.205 m Patient Age:    36  years                  BP:           93/79 mmHg Patient Gender: F                         HR:           110 bpm. Exam Location:  [facility] Procedure: 2D Echo, Color Doppler and Cardiac Doppler Indications:     I50.31 CHF-Acute Diastolic  History:         Patient has prior history of Echocardiogram examinations, most                  recent 11/05/2018. CHF, CAD, COPD; Risk Factors:Hypertension,                  Diabetes and Sleep Apnea.  Sonographer:     Charmayne Sheer Referring Phys:  [id number] [place] Diagnosing Phys: Yolonda Kida MD  Sonographer Comments: Suboptimal apical window and no subcostal window. Image acquisition challenging due to patient body habitus and Image acquisition challenging due to COPD. IMPRESSIONS  1. Left ventricular ejection fraction, by estimation, is 20 to 25%. The left ventricle has severely decreased function. The left ventricle demonstrates global hypokinesis. Left ventricular diastolic  parameters are consistent with Grade III diastolic dysfunction (restrictive). There is the interventricular septum is flattened in systole and diastole, consistent with right ventricular pressure and volume overload.  2. Right ventricular systolic function is moderately reduced. The right ventricular size is moderately enlarged. Mildly increased right ventricular wall thickness.  3. Left atrial size was moderately dilated.  4. Right atrial size was severely dilated.  5. The mitral valve is abnormal. Mild to moderate mitral valve regurgitation.  6. Tricuspid valve regurgitation is severe.  7. The aortic valve is calcified. Aortic valve regurgitation is mild to moderate. Aortic valve sclerosis/calcification is present, without any evidence of aortic stenosis. Conclusion(s)/Recommendation(s): Poor windows for evaluation of left ventricular function by transthoracic echocardiography. Would recommend an alternative means of evaluation. FINDINGS  Left Ventricle: Left ventricular ejection fraction, by estimation, is 20 to 25%. The left ventricle has severely decreased function. The left ventricle demonstrates global hypokinesis. The left ventricular internal cavity size was normal in size. There is borderline concentric left ventricular hypertrophy. The interventricular septum is flattened in systole and diastole, consistent with right ventricular pressure and volume overload. Left ventricular diastolic parameters are consistent with Grade III diastolic dysfunction (restrictive). Right Ventricle: The right ventricular size is moderately enlarged. Mildly increased right ventricular wall thickness. Right ventricular systolic function is moderately reduced. Left Atrium: Left atrial size was moderately dilated. Right Atrium: Right atrial size was severely dilated. Pericardium: There is no evidence of pericardial effusion. Mitral Valve: The mitral valve is abnormal. Mild to moderate mitral valve regurgitation. Tricuspid Valve:  The tricuspid valve is grossly normal. Tricuspid valve regurgitation is severe. Aortic Valve: The aortic valve is calcified. Aortic valve regurgitation is mild to moderate. Aortic regurgitation PHT measures 233 msec. Aortic valve sclerosis/calcification is present, without any evidence of aortic stenosis. Aortic valve mean gradient measures 6.0 mmHg. Aortic valve peak gradient measures 11.3 mmHg. Aortic valve area, by VTI measures 1.14 cm. Pulmonic Valve: The pulmonic valve was normal in structure. Pulmonic valve regurgitation is mild to moderate. Aorta: The ascending aorta was not well visualized. IAS/Shunts: No atrial level shunt detected by color flow Doppler.  LEFT VENTRICLE PLAX 2D LVIDd:  3.50 cm LVIDs:         3.20 cm LV PW:         1.20 cm LV IVS:        0.90 cm LVOT diam:     1.80 cm LV SV:         25 LV SV Index:   12 LVOT Area:     2.54 cm  RIGHT VENTRICLE RV Basal diam:  5.30 cm RV Mid diam:    5.00 cm RV S prime:     7.62 cm/s LEFT ATRIUM         Index       RIGHT ATRIUM           Index LA diam:    5.50 cm 2.49 cm/m  RA Area:     28.50 cm                                 RA Volume:   104.00 ml 47.17 ml/m  AORTIC VALVE                     PULMONIC VALVE AV Area (Vmax):    0.87 cm      PV Vmax:          0.98 m/s AV Area (Vmean):   0.90 cm      PV Vmean:         69.200 cm/s AV Area (VTI):     1.14 cm      PV VTI:           0.128 m AV Vmax:           168.00 cm/s   PV Peak grad:     3.9 mmHg AV Vmean:          116.000 cm/s  PV Mean grad:     2.5 mmHg AV VTI:            0.223 m       PR End Diast Vel: 10.24 msec AV Peak Grad:      11.3 mmHg AV Mean Grad:      6.0 mmHg LVOT Vmax:         57.60 cm/s LVOT Vmean:        40.900 cm/s LVOT VTI:          0.100 m LVOT/AV VTI ratio: 0.45 AI PHT:            233 msec  AORTA Ao Root diam: 3.10 cm MITRAL VALVE               TRICUSPID VALVE MV Area (PHT): 5.95 cm    TR Peak grad:   55.7 mmHg MV Decel Time: 128 msec    TR Vmax:        373.00 cm/s MV E  velocity: 87.60 cm/s                            SHUNTS                            Systemic VTI:  0.10 m                            Systemic Diam: 1.80 cm Yolonda Kida MD Electronically signed by Karma Greaser  Prince Rome MD Signature Date/Time: 09/17/2022/4:54:34 PM    Final    DG Chest 1 View  Result Date: 09/16/2022 CLINICAL DATA:  Worsening shortness of breath. 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 Cardiomegaly which has increased from 2021. Slight globular configuration of the heart, query pericardial effusion. Suspect small right pleural effusion. Chronic bilateral lung opacities that appears stable. No acute airspace disease or pneumothorax. On limited assessment, no acute osseous findings. IMPRESSION: 1. Increased cardiomegaly from 2021. Slight globular configuration of the heart, query pericardial effusion. 2. Suspect small right pleural effusion. 3. Stable chronic bilateral lung opacities. Electronically Signed   By: Keith Rake M.D.   On: 09/16/2022 17:21   MR ABDOMEN MRCP WO CONTRAST  Result Date: 09/16/2022 CLINICAL DATA:  56 year old female with history of jaundice. Nausea. EXAM: MRI ABDOMEN WITHOUT CONTRAST  (INCLUDING MRCP) TECHNIQUE: Multiplanar multisequence MR imaging of the abdomen was performed. Heavily T2-weighted images of the biliary and pancreatic ducts were obtained, and three-dimensional MRCP images were rendered by post processing. COMPARISON:  No prior abdominal MRI or MRCP. Abdominal ultrasound 09/15/2022. FINDINGS: Comment: Portions of today's examination are substantially limited by patient respiratory motion. The study is also severely limited for detection and characterization of visceral and/or vascular lesions by lack of IV gadolinium. Lower chest: Severe cardiomegaly. Massively distended inferior vena cava. Hepatobiliary: No definite  suspicious cystic or solid hepatic lesions are confidently identified on today's motion limited noncontrast examination. No intra or extrahepatic biliary ductal dilatation noted on MRCP images. Common bile duct measures only 3 mm in the porta hepatis. No filling defect within the common bile duct to suggest choledocholithiasis. T1 hyperintense, T2 hypointense material lies dependently within the gallbladder. Gallbladder does not appear overly distended. Accurate assessment for gallbladder wall thickening is limited on today's examination secondary to motion, but there may be some very mild gallbladder wall thickening and edema. No overt pericholecystic fluid or surrounding inflammatory changes. Pancreas: No definite pancreatic mass or peripancreatic fluid collections or inflammatory changes noted on today's noncontrast examination. No pancreatic ductal dilatation. Spleen:  Unremarkable. Adrenals/Urinary Tract: Unenhanced appearance of the kidneys and bilateral adrenal glands is normal. No hydroureteronephrosis in the visualized portions of the abdomen. Stomach/Bowel: Visualized portions are unremarkable. Vascular/Lymphatic: No aneurysm identified in the visualized abdominal vasculature. No lymphadenopathy noted in the abdomen. Other: No significant volume of ascites noted in the visualized portions of the peritoneal cavity. Musculoskeletal: No aggressive appearing osseous lesions are noted in the visualized portions of the skeleton. IMPRESSION: 1. Biliary sludge lying dependently in the gallbladder. Gallbladder wall may be mildly edematous, but is poorly evaluated on today's motion limited examination. If there is any clinical concern for acute cholecystitis, further evaluation with right upper quadrant abdominal ultrasound or nuclear medicine hepatobiliary scan should be considered at this time. 2. No biliary tract dilatation to suggest obstruction. No evidence of choledocholithiasis. 3. Severe cardiomegaly.  Electronically Signed   By: Vinnie Langton M.D.   On: 09/16/2022 05:28   US ABDOMEN LIMITED RUQ (LIVER/GB)  Result Date: 09/15/2022 CLINICAL DATA:  Elevated bilirubin EXAM: ULTRASOUND ABDOMEN LIMITED RIGHT UPPER QUADRANT COMPARISON:  CT abdomen and pelvis 12/01/2018 FINDINGS: Gallbladder: Sludge is present within the gallbladder. Small amount of pericholecystic fluid. The gallbladder wall is mildly thickened measuring 5 mm. Sonographic Murphy's sign was positive. Common bile duct: Diameter: 3 mm Liver: No focal lesion identified. Increased echogenicity. Portal vein is  patent on Doppler imaging with bidirectional flow. Other: None. IMPRESSION: 1. Findings compatible with acute cholecystitis. 2. 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 Electronically Signed   By: Placido Sou M.D.   On: 09/15/2022 23:07     TELEMETRY reviewed by me (LT) 09/22/2022 : Atrial flutter with 4: 1 conduction rate 70s to 80s  EKG reviewed by me: Atrial flutter with 4 1 conduction rate 84, QTc 470 on 10/23  Data reviewed by me (LT) 09/22/2022: Cross-cover progress notes, nursing notes, ordered magnesium, reviewed CBC, CMP, INR  Principal Problem:   Acute on chronic HFrEF (heart failure with reduced ejection fraction) (HCC) Active Problems:   A-fib (HCC)   HTN (hypertension)   Chronic low back pain (Primary Area of Pain) (Bilateral) w/ sciatica (Left)   Class 3 severe obesity with body mass index (BMI) of 45.0 to 49.9 in adult (HCC)   Moderate to severe pulmonary hypertension ([place])   Tobacco dependence   History of cocaine use   Iron deficiency anemia   Abdominal pain   AKI (acute kidney injury) ([place])   Elevated LFTs   Hypokalemia    ASSESSMENT AND PLAN:  Rin C. 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  Labs on admission showed acute renal failure, transaminitis, and hyperbilirubinemia, ultimately attributed to volume overload from her heart failure exacerbation.  Her echo revealed a newly reduced ejection fraction as above and her hospital course has been complicated by atrial fibrillation with RVR.  #Acute on chronic HFrEF (EF 20-25%, global hypokinesis and G3 DD, mild to moderate MR) #Rheumatic mitral valve stenosis s/p MVR 2019 #Abdominal pain BNP on admission was elevated at 558.5, echo shows significant reduction in EF from >55% 06/2021.  Her abdominal pain was ultimately attributed to volume overload/hepatic insufficiency after acute cholecystitis, choledocholithiasis, and a hematologic disorder were ruled out by general surgery, GI, and hematology.  Although her decreased mentation, elevated INR and ammonia, with persistently elevated T. bili are not entirely explained by her heart failure. -S/p IV Lasix 40 mg x 10 doses with some slow improvement in her renal function -Continue IV Lasix 40 mg twice daily for now -Add metolazone 5 mg x 1 today -Home diuretics are Lasix 40 p.o. once daily, metolazone 2.5 mg once weekly (was instructed to take 5 mg 3 times weekly) -Unclear etiology of her reduction in EF, she had normal coronaries by [facility] in 2019, query if her atrial fibrillation is contributory vs medication noncompliance, she is a limited historian.  No plan for ischemic work-up while inpatient. -Consider repeat limited echo to assess her EF now that her heart rate is better controlled, repeat BNP tomorrow morning  #Paroxysmal atrial fibrillation with RVR, now in atrial flutter Was previously managed well on metoprolol tartrate 100 mg twice daily. -discontinue sotalol 80 mg  once daily (started on 10/20) due to QTc prolongation (401 on 10/21, 470 on 10/23) -continue metoprolol tartrate 25 mg every 8 hours -Discontinue diltiazem due to reduced EF -She was on a heparin infusion on admission, has been switched back to her Xarelto, 20 mg okay per her total body  weight and creatinine clearance per pharmacy. -Hold his evening dose of Xarelto with hemoptysis/epistaxis overnight on 10/23 -Continue rate control strategy, consider DCCV on an outpatient basis -Monitor and replete electrolytes to maintain a K >4, mag >2  #Transaminitis, hyperbilirubinemia s/p MRCP #elevated INR  #Acute encephalopathy  -t bili has remained elevated since admission,, 8.8 today (peak of 9.8 on 10/19), ammonia elevated overnight at 135, INR 3.4 with concern for decreased mentation despite being off of opioid analgesics -GI assistance appreciated  #AKI Presented with a BUN/creatinine of 50/3.52 and GFR 15, which was fortunately improved to creatinine 1.6 and GFR 36 today -Daily CMP  This patient's plan of care was discussed and created with Dr. Nehemiah Massed and he is in agreement.  Signed: Tristan Schroeder , PA-C 09/22/2022, 8:30 AM [facility]

## 2022-09-23 ENCOUNTER — Inpatient Hospital Stay
Admit: 2022-09-23 | Discharge: 2022-09-23 | Disposition: A | Discharge status: Home / Self Care | Payer: Medicaid Other | Attending: Cardiology | Admitting: Cardiology

## 2022-09-23 DIAGNOSIS — G9341 Metabolic encephalopathy: Secondary | ICD-10-CM

## 2022-09-23 DIAGNOSIS — Z515 Encounter for palliative care: Secondary | ICD-10-CM

## 2022-09-23 DIAGNOSIS — I480 Paroxysmal atrial fibrillation: Secondary | ICD-10-CM | POA: Diagnosis not present

## 2022-09-23 DIAGNOSIS — M5442 Lumbago with sciatica, left side: Secondary | ICD-10-CM | POA: Diagnosis not present

## 2022-09-23 DIAGNOSIS — I5023 Acute on chronic systolic (congestive) heart failure: Secondary | ICD-10-CM | POA: Diagnosis not present

## 2022-09-23 DIAGNOSIS — Z66 Do not resuscitate: Secondary | ICD-10-CM

## 2022-09-23 DIAGNOSIS — Z6841 Body Mass Index (BMI) 40.0 and over, adult: Secondary | ICD-10-CM

## 2022-09-23 DIAGNOSIS — G8929 Other chronic pain: Secondary | ICD-10-CM

## 2022-09-23 DIAGNOSIS — E876 Hypokalemia: Secondary | ICD-10-CM

## 2022-09-23 LAB — PROTIME-INR
INR: 2.5 — ABNORMAL HIGH (ref 0.8–1.2)
Prothrombin Time: 26.9 seconds — ABNORMAL HIGH (ref 11.4–15.2)

## 2022-09-23 LAB — COMPREHENSIVE METABOLIC PANEL
ALT: 29 U/L (ref 0–44)
AST: 54 U/L — ABNORMAL HIGH (ref 15–41)
Albumin: 2.5 g/dL — ABNORMAL LOW (ref 3.5–5.0)
Alkaline Phosphatase: 69 U/L (ref 38–126)
Anion gap: 13 (ref 5–15)
BUN: 32 mg/dL — ABNORMAL HIGH (ref 6–20)
CO2: 34 mmol/L — ABNORMAL HIGH (ref 22–32)
Calcium: 8.9 mg/dL (ref 8.9–10.3)
Chloride: 97 mmol/L — ABNORMAL LOW (ref 98–111)
Creatinine, Ser: 1.25 mg/dL — ABNORMAL HIGH (ref 0.44–1.00)
GFR, Estimated: 51 mL/min — ABNORMAL LOW (ref >60)
Glucose, Bld: 84 mg/dL (ref 70–99)
Potassium: 2.2 mmol/L — CL (ref 3.5–5.1)
Sodium: 144 mmol/L (ref 135–145)
Total Bilirubin: 9.8 mg/dL — ABNORMAL HIGH (ref 0.3–1.2)
Total Protein: 6.6 g/dL (ref 6.5–8.1)

## 2022-09-23 LAB — HAPTOGLOBIN: Haptoglobin: 34 mg/dL (ref 33–346)

## 2022-09-23 LAB — ECHOCARDIOGRAM LIMITED
Area-P 1/2: 2.63 cm2
Height: 64 in
S' Lateral: 3.1 cm
Weight: 3894.21 oz

## 2022-09-23 LAB — POTASSIUM
Potassium: 2.7 mmol/L — CL (ref 3.5–5.1)
Potassium: 3 mmol/L — ABNORMAL LOW (ref 3.5–5.1)

## 2022-09-23 LAB — MAGNESIUM
Magnesium: 1.9 mg/dL (ref 1.7–2.4)
Magnesium: 1.8 mg/dL (ref 1.7–2.4)
Magnesium: 1.7 mg/dL (ref 1.7–2.4)

## 2022-09-23 LAB — BRAIN NATRIURETIC PEPTIDE: B Natriuretic Peptide: 1803.8 pg/mL — ABNORMAL HIGH (ref 0.0–100.0)

## 2022-09-23 LAB — AMMONIA: Ammonia: 49 umol/L — ABNORMAL HIGH (ref 9–35)

## 2022-09-23 MED ORDER — ENSURE ENLIVE PO LIQD
237.0000 mL | Freq: Three times a day (TID) | ORAL | Status: DC
  Administered 2022-09-23 – 2022-09-30 (×12): 237 mL via ORAL

## 2022-09-23 MED ORDER — POTASSIUM CHLORIDE 10 MEQ/100ML IV SOLN
10.0000 meq | INTRAVENOUS | Status: DC
  Filled 2022-09-23 (×6): qty 100

## 2022-09-23 MED ORDER — POTASSIUM CHLORIDE 10 MEQ/100ML IV SOLN
10.0000 meq | INTRAVENOUS | Status: AC
  Administered 2022-09-23 (×6): 10 meq via INTRAVENOUS
  Filled 2022-09-23 (×6): qty 100

## 2022-09-23 MED ORDER — LACTULOSE 10 GM/15ML PO SOLN
30.0000 g | Freq: Two times a day (BID) | ORAL | Status: DC
  Administered 2022-09-23 – 2022-10-01 (×10): 30 g via ORAL
  Filled 2022-09-23 (×16): qty 60

## 2022-09-23 MED ORDER — POTASSIUM CHLORIDE 20 MEQ PO PACK
40.0000 meq | PACK | Freq: Two times a day (BID) | ORAL | Status: DC
  Administered 2022-09-23 – 2022-09-25 (×6): 40 meq via ORAL
  Filled 2022-09-23 (×7): qty 2

## 2022-09-23 MED ORDER — PROSOURCE PLUS PO LIQD
30.0000 mL | Freq: Three times a day (TID) | ORAL | Status: DC
  Administered 2022-09-23 – 2022-09-30 (×13): 30 mL via ORAL

## 2022-09-23 MED ORDER — ADULT MULTIVITAMIN W/MINERALS CH
1.0000 | ORAL_TABLET | Freq: Every day | ORAL | Status: DC
  Administered 2022-09-23 – 2022-10-01 (×9): 1 via ORAL
  Filled 2022-09-23 (×9): qty 1

## 2022-09-23 MED ORDER — POTASSIUM CHLORIDE CRYS ER 20 MEQ PO TBCR
40.0000 meq | EXTENDED_RELEASE_TABLET | Freq: Once | ORAL | Status: AC
  Administered 2022-09-24: 40 meq via ORAL
  Filled 2022-09-23: qty 2

## 2022-09-23 MED ORDER — SPIRONOLACTONE 12.5 MG HALF TABLET
12.5000 mg | ORAL_TABLET | Freq: Every day | ORAL | Status: DC
  Administered 2022-09-23 – 2022-09-25 (×3): 12.5 mg via ORAL
  Filled 2022-09-23 (×4): qty 1

## 2022-09-23 MED ORDER — MAGNESIUM SULFATE 2 GM/50ML IV SOLN
2.0000 g | Freq: Once | INTRAVENOUS | Status: AC
  Administered 2022-09-24: 2 g via INTRAVENOUS
  Filled 2022-09-23: qty 50

## 2022-09-23 NOTE — Progress Notes (Signed)
*  PRELIMINARY RESULTS* Echocardiogram 2D Echocardiogram has been performed.  Sherrie Sport 09/23/2022, 2:30 PM

## 2022-09-23 NOTE — Progress Notes (Signed)
Initial Nutrition Assessment  DOCUMENTATION CODES:   Morbid obesity  INTERVENTION:   -Downgrade diet to dysphagia 3 for ease of intake (pt with no teeth) -Ensure Enlive po TID, each supplement provides 350 kcal and 20 grams of protein -30 ml Prosource Plus TID, each supplement provides 100 kcals and 15 grams protein -MVI with minerals daily -Feeding assistance with meals  NUTRITION DIAGNOSIS:   Inadequate oral intake related to lethargy/confusion as evidenced by per patient/family report.  GOAL:   Patient will meet greater than or equal to 90% of their needs  MONITOR:   PO intake, Supplement acceptance  REASON FOR ASSESSMENT:   Malnutrition Screening Tool    ASSESSMENT:   Pt with DM, pulm HTN, COPD, CHF, GERD, OSA was seen by PCP on day of admission for not feeling well and abd pain for about a month. Patient complained of pain in RUQ and right flank with nausea.    abnormal outpatient labs showing AKI.  Pt admitted with CHF, epistaxis, and acute metabolic encephalopathy.   Reviewed I/O's: -2.6 L x 24 hours and -728 ml since admission  UOP: 2.7 L x 24 hours   Case discussed with RN and cardiology PA. Pt has been very somnolent over the past few days. However, mental status has improved today. Per RN, pt was unable to take PO's over the past few days (was pocketing), but able to take medications today. Pt also needs help with feeding; RN feels like there is disconnect between brain and lower extremities, as pt was unable to hold a cup. Per cardiology PA, plan for cardiac ultrasound today to better assess fluid status.   Spoke with pt at bedside, who was alert, but difficult to understand at times due to soft speech. Pt typically responded best to yes and no questions. PTA pt reports she was living alone and was able to get around with a walker without difficulty. PTA she shares she was eating well, but usually uses her dentures to eat, which she does not have access to.  Noted meal completions documented at 50%. Pt's breakfast tray was untouched.   Reviewed wt hx; pt has experienced a 8.3% wt loss over the past 3 months, which is significant for time frame.   Discussed importance of good meal and supplement intake to promote healing. Pt amenable to supplements.   Medications reviewed and include lasix, colace, lactulose potassium chloride, and aldactone.   Lab Results  Component Value Date   HGBA1C 4.5 (L) 09/15/2022   PTA DM medications are none.   Labs reviewed: K: 2.2 (on IV supplementation), CBGS: 99 (inpatient orders for glycemic control are none).    NUTRITION - FOCUSED PHYSICAL EXAM:  Flowsheet Row Most Recent Value  Orbital Region Mild depletion  Upper Arm Region No depletion  Thoracic and Lumbar Region No depletion  Buccal Region No depletion  Temple Region No depletion  Clavicle Bone Region No depletion  Clavicle and Acromion Bone Region No depletion  Scapular Bone Region No depletion  Dorsal Hand No depletion  Patellar Region No depletion  Anterior Thigh Region No depletion  Posterior Calf Region No depletion  Edema (RD Assessment) Mild  Hair Reviewed  Eyes Reviewed  Mouth Reviewed  Skin Reviewed  Nails Reviewed      Diet Order:   Diet Order             DIET DYS 3 Room service appropriate? Yes; Fluid consistency: Thin  Diet effective now  EDUCATION NEEDS:   Education needs have been addressed  Skin:  Skin Assessment: Reviewed RN Assessment  Last BM:  09/23/22 (type 6)  Height:   Ht Readings from Last 1 Encounters:  09/15/22 '5\' 4"'$  (1.626 m)    Weight:   Wt Readings from Last 1 Encounters:  09/22/22 110.4 kg    Ideal Body Weight:  54.5 kg  BMI:  Body mass index is 41.78 kg/m.  Estimated Nutritional Needs:   Kcal:  1900-2000  Protein:  105-120 grams  Fluid:  > 1.9 L    Loistine Chance, RD, LDN, [place] Registered Dietitian II Certified Diabetes Care and Education  Specialist Please refer to [facility] for RD and/or RD on-call/weekend/after hours pager

## 2022-09-23 NOTE — Progress Notes (Addendum)
Progress Note   Patient: Chelsea Ramsey JOA:[id number] DOB: 1966/10/23 DOA: 09/15/2022     7 DOS: the patient was seen and examined on 09/23/2022   Brief hospital course: Taken from prior notes.  Patient is 56 year old female with DM, pulm HTN, COPD, CHF, GERD, OSA was seen by PCP on day of admission for not feeling well and abd pain for about a month. Patient complained of pain in RUQ and right flank with nausea.   abnormal outpatient labs showing AKI.   In ED, her labwork confirmed AKI with Cr 3.54, total bilirubin of 9.6.  AST mildly elevated to 57, ALT 35, Alk Phos 80.  U/S was done which showed gallbladder wall thickening to 5 mm with pericholecystic fluid, suspicious for acute cholecystitis.  CBD per U/S was 3 mm.   Surgery was consulted, recommended MRCP - MRCP showed biliary sludge, GB mild edematous. No biliary tract dilation to suggest obstruction.  No choledocholithiasis. 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  Also found to have acute systolic on chronic diastolic heart failure.  Echocardiogram shows EF of 20 to 25% with grade 3 diastolic dysfunction, right ventricular pressure and volume overload.  Prior echo done in December 2019 with a EF of 50 to 55%.  She was started on IV diuresis.  As blood pressure remained soft which interfere with other medical management, she was started on midodrine.  Remained in A-fib with intermittently going in RVR.  Missed Cardizem doses yesterday due to softer blood pressure.  Patient was on heparin infusion for the past 5 days, do not see any plan for cardiac cath by cardiology.  No clear recommendations. CMP remained with elevated T. bili at 9.1, she continued to have  upper abdominal pain. Concern of cardiohepatic with hepatic congestion secondary to heart failure. Secure chat with Dr. Humphrey Rolls and we will discontinue heparin infusion today and restart her home Xarelto.  Patient will get benefit from ischemic work-up with this new HFrEF. Norco switched with oxycodone immediate release to see if that will better serve her pain. No recorded urinary output as she was receiving IV diuresis-message sent to nursing staff. Potassium at 3.1 which is being repleted.  Renal functions continue to improve slowly.  10/23: Discussed with Dr. Nehemiah Massed on chat, there is no need for immediate ischemic work-up. T. bili started improving, at 8.5 today.  Rest of the labs improved.  Renal function stable. Pain with better control after changing to oxycodone.  Awaiting disposition.  10/24: Patient became more lethargic and somnolent in the evening, ABG and ammonia levels were checked and found to have CO2 of 59 and ammonia of 135.  Had an episode of self-limiting epistaxis followed by coughing up some blood overnight.  Patient is on Xarelto for atrial flutter, which is being held. Lactulose enema was added as she is unable to take much p.o. Remained very lethargic and somnolent this morning.  CMP with improvement in creatinine, T. bili at 8.8 with INR of 3.6.  Concern of hepatic failure, another message sent to GI.  Recent liver ultrasound with hepatic steatosis and portal hypertension, no cirrhosis.  VBG was mostly within normal limit except mildly elevated bicarb. Repeat ammonia levels with some improvement to 65. Currently unable to swallow as pocketing in her mouth, able to  answer orientation questions appropriately but response was very slow. Palliative care consult was also placed.  10/25: Patient little more alert and oriented.  Had multiple bowel movements with lactulose enema yesterday, ammonia at 49.  Worsening BNP at 12/07/2001 today, significant hypokalemia with potassium of  2.2, which is being repleted.  Magnesium at 1.9.  Worsening of T. bili at 9.8 And some improvement of creatinine to 1.25.  Cardiology ordered another limited echocardiogram for reevaluation of her volume status and heart failure. GI started her on rifaximin.  She also received vitamin K with some improvement of INR to 2.5 today.  Patient will remain very high risk for deterioration and mortality.  Currently full code and palliative is on board.  She does not want to talk about her wishes and would like her mother to be a Media planner.   Assessment and Plan: * Acute on chronic HFrEF (heart failure with reduced ejection fraction) ([place]) Patient was found to have new onset HFrEF with EF of 20 to 25% and grade 3 diastolic dysfunction.  Had low normal EF on prior echocardiogram done in 2020. Cardiology was consulted-no plan for more ischemic work-up during current hospitalization as she has clean coronaries in 2019.  Renal function stable with some concern of worsening hepatic function.  T. bili worsening with some improvement of renal function. -Continue with IV Lasix 40 mg twice daily. -Need strict intake and output -Daily weight and CMP -Continue to monitor  Epistaxis No more episode. -Holding Xarelto-concern of hepatic coagulopathy and liver failure -Continue to monitor  Acute metabolic encephalopathy Mental status with some improvement today.  Patient has a picture of hepatic encephalopathy without any diagnosis of cirrhosis.  Concern of cardiohepatic.  Recent liver ultrasound with hepatic steatosis and elevated portal pressures.  INR improved to 2.5 after getting 1 dose of vitamin K, worsening T. bili to 9.8, liver enzymes mostly within normal limit.   -Continue lactulose-dose decreased to twice daily due to excessive bowel movements, need to titrate for 2-3 soft bowel movement daily -Continue to monitor  Abdominal pain Pt c/o right sided abd pain for one month.  Work-up so far with  MRCP shows biliary sludge, mildly edematous gallbladder, no biliary tract dilation to suggest obstruction.  No choledocholithiasis. 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  T. bili with some worsening today at 9.8, iron 2.5 -Gastroenterology discontinued opioids. -Can do low-dose tramadol if needed.   Elevated LFTs Most likely secondary to congestive hepatopathy, also concern of Nash liver disease.  Worsening liver synthetic function. GI and general surgery signed off, initially but GI was reconsulted. Patient is being diuresed. -Continue to monitor  AKI (acute kidney injury) [facility]) Patient presented with creatinine of 3.54 with baseline around 1. Slowly improving, creatinine at 1.25 today, concern of cardiorenal -Monitor renal function while she is being diuresed -Avoid renal toxins  HTN (hypertension) Blood pressure remained on lower normal limit. She was started on midodrine with some improvement. -Continue with midodrine -Monitor closely when she is being diuresed with Lasix  amiodarone allergies were noted.    Iron deficiency anemia Pt has h/o IDA / is currently anemic and is on xarelto for a/fib. In afib rvr on ekg.  Pt being followed by Dr.vanga for her IDA and Eval for GI loss. GI eval showed: Endoscopy Shows Studies Complaint Capsule History Speak, Normal Small Bowel Transit, Scattered Small Bowel Lymphangiectasia's, No Source of Iron Deficiency Anemia Identified - 08/18/2022. Endoscopy shows erythematous  mucosa  in the antrum, normal duodenal bulb and second portion of duodenum, colonoscopy showed melanosis coli but was otherwise normal 06/29/2022.  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 Currently hemoglobin stable. Can-continue to monitor -Continue with iron supplement-restarting home meds.  A-fib (HCC) Remained in A-fib with going in and out  of RVR.  Concern of atrial flutter -Continue with beta-blocker . -Xarelto is being held due to recent episode of epistaxis and mild hemoptysis and INR  3.6>2.5 -Patient will need cardioversion as outpatient per cardiology  Tobacco dependence -Nicotine patch.   History of cocaine use UDS done on 09/16/2022 were positive for benzodiazepines, opioids and tricyclic.   Chronic low back pain (Primary Area of Pain) (Bilateral) w/ sciatica (Left) Seems to have an history of chronic pain syndrome and fibromyalgia. She was on oxycodone and Neurontin at home. -Restarting oxycodone and Neurontin  Class 3 severe obesity with body mass index (BMI) of 45.0 to 49.9 in adult [facility]) Estimated body mass index is 42.76 kg/m as calculated from the following:   Height as of this encounter: 5' 4"  (1.626 m).   Weight as of this encounter: 113 kg.   -This will complicate overall prognosis  Hypokalemia Potassium decreased to 2.2 after having multiple bowel movements.  Magnesium at 1.9.  Ordered repletion through IV and p.o. Replace potassium and monitor  Hypomagnesemia Improved with magnesium of 1.9 today -Replete magnesium as needed and monitor  Moderate to severe pulmonary hypertension (HCC) Chronic respiratory failure. Patient is on 3 to 4 L of oxygen at home.  Currently at baseline. -Continue to monitor   Subjective: Patient was seen and examined today.  She was more alert and oriented.  Quite tearful while talking with palliative care and does not want to discuss about CODE STATUS.  She wants her mother to make decisions on her behalf.  Physical Exam: Vitals:   09/22/22 2311 09/23/22 0401 09/23/22 0744 09/23/22 1203  BP: 117/80 104/70 109/71 104/75  Pulse: 99 80 96 87  Resp:  15 18 18   Temp: 98 F (36.7 C) 98.6 F (37 C) 98.5 F (36.9 C) 99.9 F (37.7 C)  TempSrc: Axillary   Axillary  SpO2: 97% 93% 94% 100%  Weight:      Height:       General.  Ill-appearing lady, in no acute  distress. Pulmonary.  Lungs clear bilaterally, normal respiratory effort. CV.  Irregularly irregular, JVD,  Abdomen.  Soft, nontender, nondistended, BS positive. CNS.  Alert and oriented .  No focal neurologic deficit. Extremities.  1+ LE edema, no cyanosis, pulses intact and symmetrical. Psychiatry.  Judgment and insight appears normal.   Data Reviewed: Prior data reviewed  Family Communication: Called mother with no response. Disposition: Status is: Inpatient Remains inpatient appropriate because: Severity of illness   Planned Discharge Destination: SNF   Time spent: 45 minutes  This record has been created using Systems analyst. Errors have been sought and corrected,but may not always be located. Such creation errors do not reflect on the standard of care.  Author: Lorella Nimrod, MD 09/23/2022 12:54 PM  For on call review [email or web address].

## 2022-09-23 NOTE — Consult Note (Signed)
Consultation Note Date: 09/23/2022   Patient Name: Chelsea Ramsey  DOB: 1966/05/27  MRN: [id number]  Age / Sex: 56 y.o., female  PCP: Emelia Loron, NP Referring Physician: Lorella Nimrod, MD  Reason for Consultation: Establishing goals of care  HPI/Patient Profile: 56 y.o. female  with past medical history of type 2 diabetes, pulmonary HTN, COPD, chronic low back pain, CHF, obesity, chronic respiratory failure (3-4 L at home), GERD, OSA, iron deficiency anemia, A-fib (Xarelto), and history of cocaine use (UDS on 10/18 positive for benzodiazepines, opioids, and tricyclics) admitted on 20/91/9802 with RUQ/flank pain with nausea for approximately 1 month.   Patient is being treated for acute on chronic HFrEF (EF of 20 to 25%), acute encephalopathy (metabolic), and diffuse abdominal pain.  GI is following patient closely.  PMT was consulted to discuss [place].   Clinical Assessment and Goals of Care: I have reviewed medical records including EPIC notes, labs and imaging, assessed the patient and then met with patient at bedside  to discuss diagnosis prognosis, GOC, EOL wishes, disposition and options.  I introduced Palliative Medicine as specialized medical care for people living with serious illness. It focuses on providing relief from the symptoms and stress of a serious illness. The goal is to improve quality of life for both the patient and the family.  Patient is able to say yes or no to questions but has difficulty forming words to voice.  She shares she misses her "daddy ".  When asked what she understands about her current medical situation, she says "my liver". She is unable to elaborate further.  Medical update and discussion of HF, AKI, and cardio/hepatic issues had with Dr. Reesa Chew at bedside. Pt became tearful.  Therapeutic silence, active listening, and emotional support provided by myself and Dr.  Reesa Chew.  We discussed patient's current illness and what it means in the larger context of patient's on-going co-morbidities.  I attempted to elicit values and goals of care important to the patient. She said and continually repeated "my momma". When asked her thought on who would make medical decisions for her in the event that she is unable to speak for herself, the patient said "my mama".  Patient confirms that she has a husband but was not able to confirm she is legally married to him.  When asked if she would want her husband to make her decisions she said no.  When asked if she would want her mother to make her decisions she said yes.   I attempted to discuss advance directives, CODE STATUS, and other advanced care planning decisions.  However, patient continues to say that she would want her mother to make decisions for her.    I described a CODE BLUE and full code in detail to the patient.  When asked if this is something she is in agreement with she says she will "do what my mama says". Patient defers all decisions to her mother.   After my discussions with the patient, I spoke with patient's mother over the  phone. I attempted to ellicit value and goals important to the patient and her mother. Her mother shares she does not want her daughter to suffer.  She wants everything to be done to try to treat what can be treated.  However, her mother is in agreement to allow a natural, peaceful passing if that should occur.  CODE STATUS discussed in detail.  DNR versus full code reviewed.  Patient's mother was in agreement to change patient's CODE STATUS to DNR.   Attending and RN made aware of change of CODE STATUS to DNR.  Education offered regarding concept specific to human mortality and the limitations of medical interventions to prolong life when the body begins to fail to thrive. Patient's mother wants to minimize upsetting the patient with news of her poor health. We discussed importance of  informing the patient but also not upsetting/overwhelming her. Mothers conveyed that patient has had several family members with liver/kidney failure so patient is sensitive to having this as her diagnosis.   Questions and concerns were addressed. The family was encouraged to call with questions or concerns.   PMT will continue to follow the patient and support her/her family throughout this hospitalization.   Primary Decision Maker NEXT OF KIN - mother - patient defers medical decision making to her mother  Physical Exam Vitals reviewed.  Constitutional:      Appearance: She is ill-appearing. She is not toxic-appearing.  HENT:     Head: Normocephalic.     Mouth/Throat:     Mouth: Mucous membranes are moist.  Eyes:     General: Scleral icterus present.  Cardiovascular:     Rate and Rhythm: Normal rate.     Pulses: Normal pulses.  Pulmonary:     Effort: Pulmonary effort is normal.  Abdominal:     Tenderness: There is abdominal tenderness.  Skin:    General: Skin is warm and dry.  Neurological:     Mental Status: Mental status is at baseline.     Palliative Assessment/Data: 30-40%     Thank you for this consult. Palliative medicine will continue to follow and assist holistically.   Time Total: 75 minutes Greater than 50%  of this time was spent counseling and coordinating care related to the above assessment and plan.  Signed by: Jordan Hawks, DNP, FNP-BC Palliative Medicine    Please contact Palliative Medicine Team phone at [phone] for questions and concerns.  For individual provider: See Shea Evans

## 2022-09-23 NOTE — Progress Notes (Signed)
Lab called with critical potassium level of 2.2 , provider [place] called and aware. Pt asymptomatic , call bell within reach.

## 2022-09-23 NOTE — Progress Notes (Addendum)
[place] NOTE       Patient ID: Chelsea Ramsey MRN: [id number] DOB/AGE: 1966-07-11 56 y.o.  Admit date: 09/15/2022 Referring Physician Dr. Estill Cotta Primary Physician Dr. Gracy Racer Primary Cardiologist Dr. 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  Labs on admission showed acute renal failure, transaminitis, and hyperbilirubinemia, ultimately attributed to volume overload from her heart failure exacerbation.  Her echo revealed a newly reduced ejection fraction as above and her hospital course has been complicated by atrial fibrillation with RVR and persistently elevated T. bili.  Ongoing primary complaint is generalized abdominal pain.  Interval history: -Mentation improved today she is alert and oriented to self, year, place, and situation.  Her voice is very soft and sometimes takes a few moments of repeating herself before getting out what she wants to say.  She is able to tell me she feels "winded" and overall very weak.  Her main complaint is generalized abdominal pain that is not improved since admission. -BNP worsening despite diuresis and improvement in renal function (BUN/creatinine 32/1.25 and GFR 51), elevated at 1800 (was 558 on 10/20) -Admits to smoking tobacco, denies alcohol or illicit substance use.   Past Medical History:  Diagnosis Date   Acute drug-induced gout of left foot 03/01/2018   Last Assessment & Plan:  Likely at least partially brought  on by diuresis.  Needs to continue to diurese  Will push hydration Stop allopurinol given initiation during acute flare may worsen this, re-broach this when asymptomatic Avoid nsaids given stomach pain Trial colchicine Add acetaminophen Ice, elevate, rest   Allergy    seasonal   Anxiety    Arthritis    Right Knee   Asthma    CHF (congestive heart failure) (HCC)    COPD (chronic obstructive pulmonary disease) (HCC)    Coronary artery disease    Leaky heart valve   Diabetes mellitus without complication (HCC)    Dysrhythmia    Fibromyalgia    GERD (gastroesophageal reflux disease)    Hypertension    Pneumonia    PUD (peptic ulcer disease)    Pulmonary HTN (HCC)    Rheumatic fever/heart disease    Sleep apnea     Past Surgical History:  Procedure Laterality Date   ADENOIDECTOMY     COLONOSCOPY WITH PROPOFOL N/A 11/06/2019   Procedure: COLONOSCOPY WITH PROPOFOL;  Surgeon: Virgel Manifold, MD;  Location: [facility] ENDOSCOPY;  Service: Endoscopy;  Laterality: N/A;  2 day prep    COLONOSCOPY WITH PROPOFOL N/A 06/29/2022   Procedure: COLONOSCOPY WITH PROPOFOL;  Surgeon: Lin Landsman, MD;  Location: [facility] ENDOSCOPY;  Service: Gastroenterology;  Laterality: N/A;   CORONARY ARTERY BYPASS GRAFT     June 27 2018   ESOPHAGOGASTRODUODENOSCOPY (EGD) WITH PROPOFOL N/A 05/25/2018   Procedure: ESOPHAGOGASTRODUODENOSCOPY (EGD) WITH PROPOFOL;  Surgeon: Lucilla Lame, MD;  Location: [facility] ENDOSCOPY;  Service: Endoscopy;  Laterality: N/A;   ESOPHAGOGASTRODUODENOSCOPY (EGD) WITH PROPOFOL N/A 11/06/2019   Procedure: ESOPHAGOGASTRODUODENOSCOPY (EGD) WITH PROPOFOL;  Surgeon: Virgel Manifold,  MD;  Location: [facility] ENDOSCOPY;  Service: Endoscopy;  Laterality: N/A;   ESOPHAGOGASTRODUODENOSCOPY (EGD) WITH PROPOFOL N/A 06/29/2022   Procedure: ESOPHAGOGASTRODUODENOSCOPY (EGD) WITH PROPOFOL;  Surgeon: Lin Landsman, MD;  Location: [facility] ENDOSCOPY;  Service: Gastroenterology;  Laterality: N/A;   GIVENS  CAPSULE STUDY N/A 07/22/2022   Procedure: GIVENS CAPSULE STUDY;  Surgeon: Lin Landsman, MD;  Location: [facility] ENDOSCOPY;  Service: Gastroenterology;  Laterality: N/A;   MITRAL VALVE REPLACEMENT     MULTIPLE TOOTH EXTRACTIONS     TONSILLECTOMY     TOTAL KNEE ARTHROPLASTY Right 09/04/2016   Procedure: TOTAL KNEE ARTHROPLASTY; with lateral release;  Surgeon: Earlie Server, MD;  Location: [facility];  Service: Orthopedics;  Laterality: Right;    Medications Prior to Admission  Medication Sig Dispense Refill Last Dose   albuterol (VENTOLIN HFA) 108 (90 Base) MCG/ACT inhaler Inhale 1-2 puffs into the lungs every 6 (six) hours as needed for wheezing or shortness of breath.   Unknown at PRN   allopurinol (ZYLOPRIM) 100 MG tablet Take 1 tablet (100 mg total) by mouth 2 (two) times daily. 60 tablet 5 Past Week at Unknown   Cholecalciferol (VITAMIN D) 50 MCG (2000 UT) CAPS Take 1 capsule (2,000 Units total) by mouth daily. 30 capsule 5    colchicine 0.6 MG tablet Take 1 tablet (0.6 mg total) by mouth daily. As directed 30 tablet 5 Unknown at PRN   docusate sodium (COLACE) 100 MG capsule Take 100 mg by mouth 2 (two) times daily as needed for mild constipation or moderate constipation.   Unknown at PRN   empagliflozin (JARDIANCE) 10 MG TABS tablet Take 10 mg by mouth daily.   Past Week at Unknown   furosemide (LASIX) 40 MG tablet TAKE 2 TABLETS BY MOUTH IN THE MORNING AND 1 TABLET AT NIGHT (Patient taking differently: 40 mg 2 (two) times daily.) 90 tablet 5 Unknown at Unknown   gabapentin (NEURONTIN) 600 MG tablet Take 0.5 tablets (300 mg total) by mouth 2 (two) times daily. (Patient taking differently: Take 600 mg by mouth 3 (three) times daily.) 60 tablet 0 Past Week at Unknown   ipratropium-albuterol (DUONEB) 0.5-2.5 (3) MG/3ML SOLN INHALE THE CONTENTS OF 1 VIAL(3MLS) VIA NEBULIZER 3 TIMES DAILY AS NEEDED 360 mL 1    metolazone (ZAROXOLYN) 2.5 MG tablet Take 5 mg by mouth 3 (three) times a week.   Past Week  at Unknown   metoprolol tartrate (LOPRESSOR) 100 MG tablet Take 1 tablet (100 mg total) by mouth 2 (two) times daily. 60 tablet 3 Past Week at Unknown   nitroGLYCERIN (NITROSTAT) 0.4 MG SL tablet Place 1 tablet (0.4 mg total) under the tongue every 5 (five) minutes as needed for chest pain. 30 tablet 12 Unknown at PRN   omeprazole (PRILOSEC) 40 MG capsule Take 40 mg by mouth daily.   Past Week at Unknown   oxyCODONE ER 9 MG C12A Take 9 mg by mouth 2 (two) times daily.   Past Week at Unknown   potassium chloride (KLOR-CON) 10 MEQ tablet Take 4 tablets (40 mEq total) by mouth 2 (two) times daily. And additional 14mq on metolazone days (Patient taking differently: Take 40 mEq by mouth See admin instructions. Take 4 tablets (419m) by mouth twice daily - take an additional 2 tablets (2070m by mouth when taking metolazone) 64 tablet 5 Past Week at Unknown   promethazine (PHENERGAN) 12.5 MG tablet Take 1 tablet (12.5 mg total) by mouth every 8 (eight) hours as needed for nausea  or vomiting. (Patient taking differently: Take 12.5 mg by mouth 2 (two) times daily as needed for nausea or vomiting.) 30 tablet 0 Unknown at PRN   rivaroxaban (XARELTO) 20 MG TABS tablet Take 20 mg by mouth daily with supper.   Past Week at Unknown   rosuvastatin (CRESTOR) 10 MG tablet Take 10 mg by mouth daily.   Past Week at Unknown   Iron-FA-B Cmp-C-Biot-Probiotic (FUSION PLUS) CAPS Take 1 capsule by mouth daily. (Patient not taking: Reported on 09/16/2022) 30 capsule 3 Not Taking   Social History   Socioeconomic History   Marital status: Married    Spouse name: Not on file   Number of children: Not on file   Years of education: Not on file   Highest education level: Not on file  Occupational History   Occupation: disabled  Tobacco Use   Smoking status: Every Day    Packs/day: 0.10    Types: Cigarettes   Smokeless tobacco: Never   Tobacco comments:    4/20 was not able to quit.  Still at 1-3 a day. She would like to  wait to set a new quit date until we get back to class to have the extra in person support  Vaping Use   Vaping Use: Never used  Substance and Sexual Activity   Alcohol use: Never    Alcohol/week: 3.0 standard drinks of alcohol    Types: 3 Cans of beer per week    Comment: 16 oz per week   Drug use: Not Currently    Comment: Previous use of cocaine and marijuana last use 07/31/16    Sexual activity: Not Currently  Other Topics Concern   Not on file  Social History Narrative   Not on file   Social Determinants of Health   Financial Resource Strain: Low Risk  (10/02/2019)   Overall Financial Resource Strain (CARDIA)    Difficulty of Paying Living Expenses: Not hard at all  Food Insecurity: No Food Insecurity (09/18/2022)   Hunger Vital Sign    Worried About Running Out of Food in the Last Year: Never true    Ran Out of Food in the Last Year: Never true  Transportation Needs: No Transportation Needs (09/18/2022)   PRAPARE - Hydrologist (Medical): No    Lack of Transportation (Non-Medical): No  Physical Activity: Insufficiently Active (10/02/2019)   Exercise Vital Sign    Days of Exercise per Week: 7 days    Minutes of Exercise per Session: 20 min  Stress: No Stress Concern Present (10/02/2019)   [place]    Feeling of Stress : Not at all  Social Connections: Moderately Integrated (10/02/2019)   Social Connection and Isolation Panel [NHANES]    Frequency of Communication with Friends and Family: More than three times a week    Frequency of Social Gatherings with Friends and Family: More than three times a week    Attends Religious Services: 1 to 4 times per year    Active Member of [employer] or Organizations: Yes    Attends Archivist Meetings: 1 to 4 times per year    Marital Status: Never married  Intimate Partner Violence: Not At Risk (09/18/2022)   Humiliation, Afraid, Rape,  and Kick questionnaire    Fear of Current or Ex-Partner: No    Emotionally Abused: No    Physically Abused: No    Sexually Abused: No    Family  History  Problem Relation Age of Onset   COPD Mother    Cancer Mother        Bone   Asthma Mother    Rheum arthritis Mother    Congestive Heart Failure Father    Breast cancer Maternal Aunt      Vitals:   09/22/22 2200 09/22/22 2311 09/23/22 0401 09/23/22 0744  BP:  117/80 104/70 109/71  Pulse:  99 80 96  Resp:   15 18  Temp:  98 F (36.7 C) 98.6 F (37 C) 98.5 F (36.9 C)  TempSrc:  Axillary    SpO2: 97% 97% 93% 94%  Weight:      Height:        PHYSICAL EXAM General: Chronically ill-appearing black female, well nourished, in no acute distress.  Sitting upright in hospital bed with nurse and nutritionist at bedside. HEENT:  Normocephalic and atraumatic. Neck:  + JVD.  Lungs: Somewhat short of breath appearing with conversational dyspnea by nasal cannula.  Decreased breath sounds bilaterally without appreciable crackles or wheezes.   Heart: Irregularly irregular with controlled rate.  Grade 3/6 systolic rumbling murmur and soft S2. abdomen: Non-distended appearing with a large pannus.  Generalized tenderness and nausea with palpation of her abdomen. Msk: Normal strength and tone for age. Extremities: Feet are cool to the touch.  No clubbing, cyanosis.  1+ bilateral lower extremity edema.  Neuro: Alert and oriented x4. Psych:   Labs: Basic Metabolic Panel: Recent Labs    09/22/22 0439 09/22/22 0839 09/23/22 0546  NA 141  --  144  K 4.1  --  2.2*  CL 99  --  97*  CO2 30  --  34*  GLUCOSE 80  --  84  BUN 36*  --  32*  CREATININE 1.66*  --  1.25*  CALCIUM 8.7*  --  8.9  MG  --  1.6* 1.9    Liver Function Tests: Recent Labs    09/22/22 0439 09/23/22 0546  AST 65* 54*  ALT 28 29  ALKPHOS 69 69  BILITOT 8.8* 9.8*  PROT 7.0 6.6  ALBUMIN 2.7* 2.5*    No results for input(s): "LIPASE", "AMYLASE" in the last 72  hours. CBC: Recent Labs    09/21/22 0555 09/22/22 0019  WBC 5.4 5.8  HGB 9.0* 9.2*  HCT 32.1* 33.4*  MCV 77.5* 79.9*  PLT 156 172    Cardiac Enzymes: No results for input(s): "CKTOTAL", "CKMB", "CKMBINDEX", "TROPONINIHS" in the last 72 hours. BNP: Recent Labs    09/23/22 0546  BNP 1,803.8*   D-Dimer: No results for input(s): "DDIMER" in the last 72 hours. Hemoglobin A1C: No results for input(s): "HGBA1C" in the last 72 hours. Fasting Lipid Panel: No results for input(s): "CHOL", "HDL", "LDLCALC", "TRIG", "CHOLHDL", "LDLDIRECT" in the last 72 hours. Thyroid Function Tests: No results for input(s): "TSH", "T4TOTAL", "T3FREE", "THYROIDAB" in the last 72 hours.  Invalid input(s): "FREET3" Anemia Panel: No results for input(s): "VITAMINB12", "FOLATE", "FERRITIN", "TIBC", "IRON", "RETICCTPCT" in the last 72 hours.   Radiology: ECHOCARDIOGRAM COMPLETE  Result Date: 09/17/2022    ECHOCARDIOGRAM REPORT   Patient Name:   HIBBA SCHRAM Date of Exam: 09/17/2022 Medical Rec #:  [id number]                 Height:       64.0 in Accession #:    [id number]                Weight:  265.0 lb Date of Birth:  01/25/1966                 BSA:          2.205 m Patient Age:    88 years                  BP:           93/79 mmHg Patient Gender: F                         HR:           110 bpm. Exam Location:  [facility] Procedure: 2D Echo, Color Doppler and Cardiac Doppler Indications:     I50.31 CHF-Acute Diastolic  History:         Patient has prior history of Echocardiogram examinations, most                  recent 11/05/2018. CHF, CAD, COPD; Risk Factors:Hypertension,                  Diabetes and Sleep Apnea.  Sonographer:     Charmayne Sheer Referring Phys:  [id number] [place] Diagnosing Phys: Chelsea Kida MD  Sonographer Comments: Suboptimal apical window and no subcostal window. Image acquisition challenging due to patient body habitus and Image acquisition challenging due to COPD.  IMPRESSIONS  1. Left ventricular ejection fraction, by estimation, is 20 to 25%. The left ventricle has severely decreased function. The left ventricle demonstrates global hypokinesis. Left ventricular diastolic parameters are consistent with Grade III diastolic dysfunction (restrictive). There is the interventricular septum is flattened in systole and diastole, consistent with right ventricular pressure and volume overload.  2. Right ventricular systolic function is moderately reduced. The right ventricular size is moderately enlarged. Mildly increased right ventricular wall thickness.  3. Left atrial size was moderately dilated.  4. Right atrial size was severely dilated.  5. The mitral valve is abnormal. Mild to moderate mitral valve regurgitation.  6. Tricuspid valve regurgitation is severe.  7. The aortic valve is calcified. Aortic valve regurgitation is mild to moderate. Aortic valve sclerosis/calcification is present, without any evidence of aortic stenosis. Conclusion(s)/Recommendation(s): Poor windows for evaluation of left ventricular function by transthoracic echocardiography. Would recommend an alternative means of evaluation. FINDINGS  Left Ventricle: Left ventricular ejection fraction, by estimation, is 20 to 25%. The left ventricle has severely decreased function. The left ventricle demonstrates global hypokinesis. The left ventricular internal cavity size was normal in size. There is borderline concentric left ventricular hypertrophy. The interventricular septum is flattened in systole and diastole, consistent with right ventricular pressure and volume overload. Left ventricular diastolic parameters are consistent with Grade III diastolic dysfunction (restrictive). Right Ventricle: The right ventricular size is moderately enlarged. Mildly increased right ventricular wall thickness. Right ventricular systolic function is moderately reduced. Left Atrium: Left atrial size was moderately dilated. Right  Atrium: Right atrial size was severely dilated. Pericardium: There is no evidence of pericardial effusion. Mitral Valve: The mitral valve is abnormal. Mild to moderate mitral valve regurgitation. Tricuspid Valve: The tricuspid valve is grossly normal. Tricuspid valve regurgitation is severe. Aortic Valve: The aortic valve is calcified. Aortic valve regurgitation is mild to moderate. Aortic regurgitation PHT measures 233 msec. Aortic valve sclerosis/calcification is present, without any evidence of aortic stenosis. Aortic valve mean gradient measures 6.0 mmHg. Aortic valve peak gradient measures 11.3 mmHg. Aortic valve area, by VTI measures 1.14 cm.  Pulmonic Valve: The pulmonic valve was normal in structure. Pulmonic valve regurgitation is mild to moderate. Aorta: The ascending aorta was not well visualized. IAS/Shunts: No atrial level shunt detected by color flow Doppler.  LEFT VENTRICLE PLAX 2D LVIDd:         3.50 cm LVIDs:         3.20 cm LV PW:         1.20 cm LV IVS:        0.90 cm LVOT diam:     1.80 cm LV SV:         25 LV SV Index:   12 LVOT Area:     2.54 cm  RIGHT VENTRICLE RV Basal diam:  5.30 cm RV Mid diam:    5.00 cm RV S prime:     7.62 cm/s LEFT ATRIUM         Index       RIGHT ATRIUM           Index LA diam:    5.50 cm 2.49 cm/m  RA Area:     28.50 cm                                 RA Volume:   104.00 ml 47.17 ml/m  AORTIC VALVE                     PULMONIC VALVE AV Area (Vmax):    0.87 cm      PV Vmax:          0.98 m/s AV Area (Vmean):   0.90 cm      PV Vmean:         69.200 cm/s AV Area (VTI):     1.14 cm      PV VTI:           0.128 m AV Vmax:           168.00 cm/s   PV Peak grad:     3.9 mmHg AV Vmean:          116.000 cm/s  PV Mean grad:     2.5 mmHg AV VTI:            0.223 m       PR End Diast Vel: 10.24 msec AV Peak Grad:      11.3 mmHg AV Mean Grad:      6.0 mmHg LVOT Vmax:         57.60 cm/s LVOT Vmean:        40.900 cm/s LVOT VTI:          0.100 m LVOT/AV VTI ratio: 0.45 AI PHT:             233 msec  AORTA Ao Root diam: 3.10 cm MITRAL VALVE               TRICUSPID VALVE MV Area (PHT): 5.95 cm    TR Peak grad:   55.7 mmHg MV Decel Time: 128 msec    TR Vmax:        373.00 cm/s MV E velocity: 87.60 cm/s                            SHUNTS  Systemic VTI:  0.10 m                            Systemic Diam: 1.80 cm Chelsea Kida MD Electronically signed by Chelsea Kida MD Signature Date/Time: 09/17/2022/4:54:34 PM    Final    DG Chest 1 View  Result Date: 09/16/2022 CLINICAL DATA:  Worsening shortness of breath. 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 Cardiomegaly which has increased from 2021. Slight globular configuration of the heart, query pericardial effusion. Suspect small right pleural effusion. Chronic bilateral lung opacities that appears stable. No acute airspace disease or pneumothorax. On limited assessment, no acute osseous findings. IMPRESSION: 1. Increased cardiomegaly from 2021. Slight globular configuration of the heart, query pericardial effusion. 2. Suspect small right pleural effusion. 3. Stable chronic bilateral lung opacities. Electronically Signed   By: Keith Rake M.D.   On: 09/16/2022 17:21   MR ABDOMEN MRCP WO CONTRAST  Result Date: 09/16/2022 CLINICAL DATA:  56 year old female with history of jaundice. Nausea. EXAM: MRI ABDOMEN WITHOUT CONTRAST  (INCLUDING MRCP) TECHNIQUE: Multiplanar multisequence MR imaging of the abdomen was performed. Heavily T2-weighted images of the biliary and pancreatic ducts were obtained, and three-dimensional MRCP images were rendered by post processing. COMPARISON:  No prior abdominal MRI or MRCP. Abdominal ultrasound 09/15/2022. FINDINGS: Comment: Portions of today's examination are substantially limited by patient respiratory motion. The study is also severely  limited for detection and characterization of visceral and/or vascular lesions by lack of IV gadolinium. Lower chest: Severe cardiomegaly. Massively distended inferior vena cava. Hepatobiliary: No definite suspicious cystic or solid hepatic lesions are confidently identified on today's motion limited noncontrast examination. No intra or extrahepatic biliary ductal dilatation noted on MRCP images. Common bile duct measures only 3 mm in the porta hepatis. No filling defect within the common bile duct to suggest choledocholithiasis. T1 hyperintense, T2 hypointense material lies dependently within the gallbladder. Gallbladder does not appear overly distended. Accurate assessment for gallbladder wall thickening is limited on today's examination secondary to motion, but there may be some very mild gallbladder wall thickening and edema. No overt pericholecystic fluid or surrounding inflammatory changes. Pancreas: No definite pancreatic mass or peripancreatic fluid collections or inflammatory changes noted on today's noncontrast examination. No pancreatic ductal dilatation. Spleen:  Unremarkable. Adrenals/Urinary Tract: Unenhanced appearance of the kidneys and bilateral adrenal glands is normal. No hydroureteronephrosis in the visualized portions of the abdomen. Stomach/Bowel: Visualized portions are unremarkable. Vascular/Lymphatic: No aneurysm identified in the visualized abdominal vasculature. No lymphadenopathy noted in the abdomen. Other: No significant volume of ascites noted in the visualized portions of the peritoneal cavity. Musculoskeletal: No aggressive appearing osseous lesions are noted in the visualized portions of the skeleton. IMPRESSION: 1. Biliary sludge lying dependently in the gallbladder. Gallbladder wall may be mildly edematous, but is poorly evaluated on today's motion limited examination. If there is any clinical concern for acute cholecystitis, further evaluation with right upper quadrant abdominal  ultrasound or nuclear medicine hepatobiliary scan should be considered at this time. 2. No biliary tract dilatation to suggest obstruction. No evidence of choledocholithiasis. 3. Severe cardiomegaly. Electronically Signed   By: Vinnie Langton M.D.   On: 09/16/2022 05:28   US ABDOMEN LIMITED RUQ (LIVER/GB)  Result Date: 09/15/2022 CLINICAL DATA:  Elevated bilirubin EXAM: ULTRASOUND ABDOMEN LIMITED RIGHT UPPER QUADRANT COMPARISON:  CT abdomen and pelvis  12/01/2018 FINDINGS: Gallbladder: Sludge is present within the gallbladder. Small amount of pericholecystic fluid. The gallbladder wall is mildly thickened measuring 5 mm. Sonographic Murphy's sign was positive. Common bile duct: Diameter: 3 mm Liver: No focal lesion identified. Increased echogenicity. Portal vein is patent on Doppler imaging with bidirectional flow. Other: None. IMPRESSION: 1. Findings compatible with acute cholecystitis. 2. 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 Electronically Signed   By: Placido Sou M.D.   On: 09/15/2022 23:07     TELEMETRY reviewed by me (LT) 09/23/2022 : Atrial fibrillation rate 90s to low 100s  EKG reviewed by me: Atrial flutter with 4 1 conduction rate 84, QTc 470 on 10/23  Data reviewed by me (LT) 09/23/2022: GI note, hospitalist progress note, cross cover note, nursing notes, CBC, CMP, INR, BNP, ordered repeat limited echo vitals, telemetry  Principal Problem:   Acute on chronic HFrEF (heart failure with reduced ejection fraction) ([place]) Active Problems:   A-fib (HCC)   HTN (hypertension)   Chronic low back pain (Primary Area of Pain) (Bilateral) w/ sciatica (Left)   Class 3 severe obesity with body mass index (BMI) of 45.0 to 49.9 in adult (HCC)   Moderate to severe pulmonary hypertension (HCC)   Tobacco dependence   History of cocaine use   Iron deficiency anemia   Abdominal pain   AKI (acute kidney injury) ([place])   Elevated LFTs    Hypokalemia   Hypomagnesemia   Acute metabolic encephalopathy   Epistaxis    ASSESSMENT AND PLAN:  Aymar C. 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  Labs on admission showed acute renal failure, transaminitis, and hyperbilirubinemia, ultimately attributed to volume overload from her heart failure exacerbation.  Her echo revealed a newly reduced ejection fraction as above and her hospital course has been complicated by atrial fibrillation with RVR and persistently elevated T. bili.  Ongoing primary complaint is generalized abdominal pain.  #Acute on chronic HFrEF (EF 20-25%, global hypokinesis and G3 DD, RV dilation and dysfunction mild to moderate MR, AR) #Rheumatic mitral valve stenosis s/p MVR 2019 #Abdominal pain BNP on admission was elevated at 558.5 and uptrending to 1800 on 10/25 despite diuresis. Echo 10/19 shows significant reduction in EF from >55% 06/2021 and new AR and MR compared to prior study. Her abdominal pain was ultimately attributed to volume overload/hepatic insufficiency after acute cholecystitis, choledocholithiasis, and a hematologic disorder were ruled out by general surgery, GI, and hematology.  She remains clinically volume overloaded appearing, mentation improved enough on 10/25 to tell me she feels "winded" and appears short of breath, primary complaint is abdominal discomfort and nausea.  Ongoing concern for valvular insufficiencies contributing to her symptoms versus GI pathologies. -S/p IV Lasix 40 mg x 11 doses  -Continue IV Lasix 40 mg twice daily for now -S/p metolazone 5 mg x 1 10/24  with brisk diuresis and significant improvement in renal function, favor redosing after recheck of potassium this afternoon -Add low-dose spironolactone 12.5 mg once daily -Home diuretics are Lasix 40 p.o. once daily, metolazone 5 mg 3 times weekly -Unclear etiology of  her reduction in EF, she had normal coronaries by [facility] in 2019, query if her atrial fibrillation is contributory vs a valvular issue. -repeat limited echo today now that her heart rate is better controlled -BNP worsening 1800 today.  #Paroxysmal atrial fibrillation with RVR, now in atrial flutter Was previously managed well on metoprolol tartrate 100 mg twice daily. -S/p sotalol 80 mg once daily (started on 10/20) due to QTc prolongation (401 on 10/21, 470 on 10/23) -continue metoprolol tartrate 25 mg every 8 hours (home dose 100 mg twice daily)  -Discontinue diltiazem due to reduced EF -She was on a heparin infusion on admission, has been switched back to her Xarelto, 20 mg okay per her total body weight and creatinine clearance per pharmacy. -Hold Xarelto with hemoptysis/epistaxis overnight on 10/23, favor restarting 10/26 if possible. -Continue rate control strategy, consider DCCV on an outpatient basis -Monitor and replete electrolytes to maintain a K >4, mag >2  #Transaminitis, hyperbilirubinemia s/p MRCP #elevated INR  #Acute encephalopathy  -t bili has remained elevated since admission, back up to 9.8 today (peak of 9.8 on 10/19), ammonia improved from 135 with lactulose to 49 on 10/25. INR 3.4 with concern for decreased mentation despite being off of opioid analgesics -GI assistance appreciated  #AKI Presented with a BUN/creatinine of 50/3.52 and GFR 15, much improved after metolazone x1 on 10/24 2-Daily CMP  This patient's plan of care was discussed and created with Dr. Nehemiah Massed and he is in agreement.  Signed: Tristan Ramsey , PA-C 09/23/2022, 9:31 AM [facility]

## 2022-09-23 NOTE — Assessment & Plan Note (Addendum)
Patient on midodrine to elevate blood pressure, Lasix, metoprolol and Aldactone

## 2022-09-23 NOTE — Assessment & Plan Note (Addendum)
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  Severe hepatic steatosis seen on imaging.  Total bilirubin still elevated 7.6, AST slightly elevated at 58 and ALT normalized at 30.  Alkaline phosphatase normal

## 2022-09-23 NOTE — Progress Notes (Signed)
Handoff received from shift RN Amy. Patient plan of care discussed and patient and family in agreement.

## 2022-09-23 NOTE — Progress Notes (Signed)
       CROSS COVER NOTE  NAME: Chelsea Ramsey MRN: [id number] DOB : August 16, 1966 ATTENDING PHYSICIAN: Val Riles, MD    Date of Service   09/23/2022   HPI/Events of Note   Notified of Critical K-->2.2.  Interventions   Assessment/Plan: 60 mEQ IV K ordered Continue 40 mEQ PO K BID Recheck K at 1400      This document was prepared using Dragon voice recognition software and may include unintentional dictation errors.  Neomia Glass DNP, MBA, FNP-BC Nurse Practitioner [facility] Pager [phone]

## 2022-09-23 NOTE — Progress Notes (Signed)
GI Inpatient Follow-up Note  Subjective:  Patient seen and is much more alert this morning.  Scheduled Inpatient Medications:   (feeding supplement) PROSource Plus  30 mL Oral TID BM   acidophilus  1 capsule Oral Daily   allopurinol  100 mg Oral Daily   docusate sodium  100 mg Oral BID   feeding supplement  237 mL Oral TID BM   furosemide  40 mg Intravenous Q12H   gabapentin  300 mg Oral BID   lactulose  30 g Oral TID   lactulose  300 mL Rectal BID   metoprolol tartrate  25 mg Oral TID   midodrine  10 mg Oral TID WC   multivitamin with minerals  1 tablet Oral Daily   pantoprazole (PROTONIX) IV  40 mg Intravenous Q12H   potassium chloride  40 mEq Oral BID   rifaximin  550 mg Oral BID   sodium chloride flush  3 mL Intravenous Q12H   sodium chloride flush  3 mL Intravenous Q12H   spironolactone  12.5 mg Oral Daily    Continuous Inpatient Infusions:    sodium chloride     potassium chloride 10 mEq (09/23/22 1102)    PRN Inpatient Medications:  sodium chloride, acetaminophen **OR** acetaminophen, albuterol, metoprolol tartrate, nitroGLYCERIN, oxymetazoline, prochlorperazine, sodium chloride flush, traZODone  Review of Systems:  Review of Systems  Constitutional:  Negative for chills and fever.  HENT:  Positive for nosebleeds.   Respiratory:  Positive for shortness of breath.   Cardiovascular:  Negative for chest pain.  Gastrointestinal:  Positive for abdominal pain and nausea.  Musculoskeletal:  Positive for joint pain.  Skin:  Negative for rash.  Neurological:  Negative for focal weakness.  Psychiatric/Behavioral:  Negative for substance abuse.   All other systems reviewed and are negative.    Physical Examination: BP 104/75 (BP Location: Right Wrist)   Pulse 87   Temp 98.5 F (36.9 C)   Resp 18   Ht '5\' 4"'$  (1.626 m)   Wt 110.4 kg   LMP 11/08/2009 Comment: menopause  SpO2 100%   BMI 41.78 kg/m  Gen: NAD, alert and oriented x 2-3 HEENT: PEERLA, EOMI, Neck:  supple, no JVD or thyromegaly Chest: No respiratory distress Abd: soft, mild tenderness Ext: warm extremities Skin: no rash or lesions noted Lymph: no LAD  Data: Lab Results  Component Value Date   WBC 5.8 09/22/2022   HGB 9.2 (L) 09/22/2022   HCT 33.4 (L) 09/22/2022   MCV 79.9 (L) 09/22/2022   PLT 172 09/22/2022   Recent Labs  Lab 09/20/22 0317 09/21/22 0555 09/22/22 0019  HGB 8.9* 9.0* 9.2*   Lab Results  Component Value Date   NA 144 09/23/2022   K 2.2 (LL) 09/23/2022   CL 97 (L) 09/23/2022   CO2 34 (H) 09/23/2022   BUN 32 (H) 09/23/2022   CREATININE 1.25 (H) 09/23/2022   GLU 79 08/21/2014   Lab Results  Component Value Date   ALT 29 09/23/2022   AST 54 (H) 09/23/2022   GGT 33 09/17/2022   ALKPHOS 69 09/23/2022   BILITOT 9.8 (H) 09/23/2022   Recent Labs  Lab 09/22/22 0019 09/23/22 0546  APTT 45*  --   INR 3.6* 2.5*   Assessment/Plan: Chelsea Ramsey is a 56 y.o. lady with HFrEF (EF 20-25%, artifact due to a.fib?) and severe diastolic dysfunction as well, pulmonary hypertension (no right heart cath noted), and chronic respiratory failure on home O2 who presented with heart failure and elevated  T. Bilirubin ( 50/50 conjugated and unconjugated) with abnormality attributed to chronic hepatic congestion. We have been re-consulted due to mental status change in setting of elevated ammonia. Elevated ammonia has been present since admission. In acute liver failure the level is typically in the thousands. Prior to this episode she had no biochemical evidence of cirrhosis but likely has a component of NAFLD. Mental status improved today so altered mental status likely from opioids  Recommendations:  # AMS - likely secondary to opioids. Her abdominal pain is likely from capsular stretch from volume overload. Cardiology plans to get repeat echo so would be interesting to see if IVC is still dilated - avoid opioid pain medicines - ok to continue xifaxin - maintain two to  three bowel movements daily, either with miralax or lactulose - no need to trend ammonia   # Hyperbilirubinemia - agree with initial diagnosis of hepatic congestion which is made more prominent by likely NAFLD. Recent EGD was unremarkable. The bilirubin is 50% conjugated and 50% unconjugated which is typical of passive congestion. She has visibly elevated JVD consistent with elevated right heart pressure. 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 Appreciate cardiology assistance with this complex case. - would recommend daily CMP and INR - management of heart failure per cardiology   # Coagulopathy - likely secondary to malnutrition as evidenced by the response to vitamin K - monitor INR as above - nutrition    Please call with any questions or concerns.   Chelsea Miyamoto MD, MPH [place]

## 2022-09-23 NOTE — Progress Notes (Signed)
Date and time results received: 09/23/22 2:33 PM  Test: K+ Critical Value: 2.7  Name of Provider Notified: amin  Orders Received? Or Actions Taken?: verbal order to give additional 32mq of K

## 2022-09-23 NOTE — Progress Notes (Signed)
PT Cancellation Note  Patient Details Name: Christmas Faraci MRN: [id number] DOB: 05-07-1966   Cancelled Treatment:    Reason Eval/Treat Not Completed: Patient declined, no reason specified. Pt's chart reviewed. Per RN pt wanting to work with PT and try to mobilize. Upon entry, pt emotional, declining PT attempts. Pt requesting need to be "cleaned up" but attempting to verbalize fear and concerns about her medical issues. She begins crying talking to Chief Strategy Officer. PT providing therapeutic listening but pt hard to understand speech. Asking pt if she would like a chaplain to visit her. Pt nodding "yes". RN notified about pt concerns. Will re-attempt as medically appropriate.    Salem Caster. Fairly IV, PT, DPT Physical Therapist- [facility]  09/23/2022, 1:58 PM

## 2022-09-24 DIAGNOSIS — R1010 Upper abdominal pain, unspecified: Secondary | ICD-10-CM | POA: Diagnosis not present

## 2022-09-24 DIAGNOSIS — I480 Paroxysmal atrial fibrillation: Secondary | ICD-10-CM | POA: Diagnosis not present

## 2022-09-24 DIAGNOSIS — G9341 Metabolic encephalopathy: Secondary | ICD-10-CM | POA: Diagnosis not present

## 2022-09-24 DIAGNOSIS — I5023 Acute on chronic systolic (congestive) heart failure: Secondary | ICD-10-CM | POA: Diagnosis not present

## 2022-09-24 LAB — CBC
HCT: 35.2 % — ABNORMAL LOW (ref 36.0–46.0)
Hemoglobin: 9.7 g/dL — ABNORMAL LOW (ref 12.0–15.0)
MCH: 22.4 pg — ABNORMAL LOW (ref 26.0–34.0)
MCHC: 27.6 g/dL — ABNORMAL LOW (ref 30.0–36.0)
MCV: 81.1 fL (ref 80.0–100.0)
Platelets: 127 10*3/uL — ABNORMAL LOW (ref 150–400)
RBC: 4.34 MIL/uL (ref 3.87–5.11)
RDW: 23 % — ABNORMAL HIGH (ref 11.5–15.5)
WBC: 13.7 10*3/uL — ABNORMAL HIGH (ref 4.0–10.5)
nRBC: 0.4 % — ABNORMAL HIGH (ref 0.0–0.2)

## 2022-09-24 LAB — COMPREHENSIVE METABOLIC PANEL
ALT: 27 U/L (ref 0–44)
AST: 47 U/L — ABNORMAL HIGH (ref 15–41)
Albumin: 2.5 g/dL — ABNORMAL LOW (ref 3.5–5.0)
Alkaline Phosphatase: 76 U/L (ref 38–126)
Anion gap: 13 (ref 5–15)
BUN: 33 mg/dL — ABNORMAL HIGH (ref 6–20)
CO2: 37 mmol/L — ABNORMAL HIGH (ref 22–32)
Calcium: 9 mg/dL (ref 8.9–10.3)
Chloride: 97 mmol/L — ABNORMAL LOW (ref 98–111)
Creatinine, Ser: 1.27 mg/dL — ABNORMAL HIGH (ref 0.44–1.00)
GFR, Estimated: 50 mL/min — ABNORMAL LOW (ref >60)
Glucose, Bld: 90 mg/dL (ref 70–99)
Potassium: 3.1 mmol/L — ABNORMAL LOW (ref 3.5–5.1)
Sodium: 147 mmol/L — ABNORMAL HIGH (ref 135–145)
Total Bilirubin: 9.8 mg/dL — ABNORMAL HIGH (ref 0.3–1.2)
Total Protein: 6.6 g/dL (ref 6.5–8.1)

## 2022-09-24 LAB — POTASSIUM: Potassium: 3.5 mmol/L (ref 3.5–5.1)

## 2022-09-24 LAB — PROTIME-INR
INR: 1.8 — ABNORMAL HIGH (ref 0.8–1.2)
Prothrombin Time: 20.3 seconds — ABNORMAL HIGH (ref 11.4–15.2)

## 2022-09-24 LAB — MAGNESIUM: Magnesium: 2.4 mg/dL (ref 1.7–2.4)

## 2022-09-24 LAB — OSMOLALITY: Osmolality: 310 mOsm/kg — ABNORMAL HIGH (ref 275–295)

## 2022-09-24 MED ORDER — METOLAZONE 2.5 MG PO TABS
2.5000 mg | ORAL_TABLET | Freq: Once | ORAL | Status: AC
  Administered 2022-09-24: 2.5 mg via ORAL
  Filled 2022-09-24: qty 1

## 2022-09-24 MED ORDER — RIVAROXABAN 20 MG PO TABS
20.0000 mg | ORAL_TABLET | Freq: Every day | ORAL | Status: DC
  Administered 2022-09-24: 20 mg via ORAL
  Filled 2022-09-24: qty 1

## 2022-09-24 NOTE — Progress Notes (Signed)
Physical Therapy Treatment Patient Details Name: Chelsea Ramsey MRN: [id number] DOB: 1966/10/14 Today's Date: 09/24/2022   History of Present Illness Pt is a 56 yo female seen by PCP secondary to not feeling well with abd pain for about a month. MD assessment includes: Right sided abdominal pain, transaminitis, hyperbilirubinemia, AKI, and iron deficiency anemia.   PT Comments    The patient was sleeping soundly on arrival to room. She required stimulation to remain alert at first but is lethargic overall. She was able to follow single step commands most of the time with increased time and multi-modal cueing. She required significant assistance for bed mobility. She was unable to stand with one person assistance, poor sitting balance that required external support initially. The patient required total assistance for peri-care after having a large bowel movement. Recommend to continue PT to maximize independence and facilitate return to prior level of function. SNF recommended at discharge.    Recommendations for follow up therapy are one component of a multi-disciplinary discharge planning process, led by the attending physician.  Recommendations may be updated based on patient status, additional functional criteria and insurance authorization.  Follow Up Recommendations  Skilled nursing-short term rehab (<3 hours/day) Can patient physically be transported by private vehicle: No   Assistance Recommended at Discharge Frequent or constant Supervision/Assistance  Patient can return home with the following Two people to help with walking and/or transfers;Two people to help with bathing/dressing/bathroom;Assistance with cooking/housework;Help with stairs or ramp for entrance;Assist for transportation   Equipment Recommendations   (if patient is going home, recommend hospital bed, wheelchair, 3-in-1)    Recommendations for Other Services       Precautions / Restrictions  Precautions Precautions: Fall Restrictions Weight Bearing Restrictions: No     Mobility  Bed Mobility Overal bed mobility: Needs Assistance Bed Mobility: Sit to Supine, Supine to Sit Rolling: Max assist   Supine to sit: Max assist Sit to supine: Max assist, +2 for physical assistance   General bed mobility comments: assistance for LE and trunk support. significant assistance is required for bed mobility. increased time and effort with occasional tactile cues required for task initiation.    Transfers                   General transfer comment: unable to stand with one person assistance. poor sitting balance. patient had large volume of loose stool that needed to be cleaned in the bed. nurse aide assisted with clean up/peri-care in bed    Ambulation/Gait                   Stairs             Wheelchair Mobility    Modified Rankin (Stroke Patients Only)       Balance Overall balance assessment: Needs assistance Sitting-balance support: Bilateral upper extremity supported Sitting balance-Leahy Scale: Poor Sitting balance - Comments: external support required to maintain sitting balance with posterior lean. with increased sitting time, patient required close stand by assistance.                                    Cognition Arousal/Alertness: Lethargic Behavior During Therapy: Flat affect Overall Cognitive Status: No family/caregiver present to determine baseline cognitive functioning  General Comments: patient initially sleeping on arrival to room and needs stimulation to remain awake/alert at the start of session. she has some confusion and is a poor historian. she is able to follow single step commands some of the time with increased time        Exercises      General Comments        Pertinent Vitals/Pain Pain Assessment Pain Assessment: No/denies pain    Home Living                           Prior Function            PT Goals (current goals can now be found in the care plan section) Acute Rehab PT Goals Patient Stated Goal: to go home PT Goal Formulation: With patient Time For Goal Achievement: 09/30/22 Potential to Achieve Goals: Fair Progress towards PT goals: Progressing toward goals    Frequency    Min 2X/week      PT Plan Current plan remains appropriate    Co-evaluation              AM-PAC PT "6 Clicks" Mobility   Outcome Measure  Help needed turning from your back to your side while in a flat bed without using bedrails?: A Lot Help needed moving from lying on your back to sitting on the side of a flat bed without using bedrails?: Total Help needed moving to and from a bed to a chair (including a wheelchair)?: Total Help needed standing up from a chair using your arms (e.g., wheelchair or bedside chair)?: Total Help needed to walk in hospital room?: Total Help needed climbing 3-5 steps with a railing? : Total 6 Click Score: 7    End of Session   Activity Tolerance: Patient limited by fatigue;Patient limited by lethargy Patient left: in bed;with call bell/phone within reach;with bed alarm set (set-up with breakfast tray with head of bed elevated. MD in the room) Nurse Communication: Mobility status PT Visit Diagnosis: Muscle weakness (generalized) (M62.81);Difficulty in walking, not elsewhere classified (R26.2);Unsteadiness on feet (R26.81)     Time: 3382-5053 PT Time Calculation (min) (ACUTE ONLY): 39 min  Charges:  $Therapeutic Activity: 38-52 mins                     Minna Merritts, PT, MPT    Percell Locus 09/24/2022, 12:31 PM

## 2022-09-24 NOTE — Progress Notes (Addendum)
[place] NOTE       Patient ID: Chelsea Ramsey MRN: [id number] DOB/AGE: December 30, 1965 56 y.o.  Admit date: 09/15/2022 Referring Physician Dr. Estill Cotta Primary Physician Dr. Gracy Racer Primary Cardiologist Dr. 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  Labs on admission showed acute renal failure (improved with diuresis), transaminitis, and hyperbilirubinemia, ultimately attributed to volume overload from her heart failure exacerbation.  Initial echo on 10/19 revealed a newly reduced LVEF with moderately reduced RV function, and severe TR. Hospital course complicated by atrial fibrillation with RVR and persistently elevated T. bili.  Ongoing primary complaint is generalized abdominal pain. Repeat limited echo on 10/25 after aggressive diuresis showed persistent dilation of hepatic vein, moderate RV enlargement, severely elevated PASP, and severe TR, c/w severe pulmonary hypertension.  Interval history: -seen with physical therapy at bedside, requiring significant assistance to maintain upright posture with legs off bed -when asked about how her abdominal pain is she says "not good" and does not elaborate further. Asked further questions regarding possible missed doses of diuretics or other medications prior to this admission, she does not answer me or respond to further questions -renal function stabilized, T bili remains  significantly elevated at 9.8, BMP otherwise trending towards euvolemia.    Past Medical History:  Diagnosis Date   Acute drug-induced gout of left foot 03/01/2018   Last Assessment & Plan:  Likely at least partially brought on by diuresis.  Needs to continue to diurese  Will push hydration Stop allopurinol given initiation during acute flare may worsen this, re-broach this when asymptomatic Avoid nsaids given stomach pain Trial colchicine Add acetaminophen Ice, elevate, rest   Allergy    seasonal   Anxiety    Arthritis    Right Knee   Asthma    CHF (congestive heart failure) (HCC)    COPD (chronic obstructive pulmonary disease) (HCC)    Coronary artery disease    Leaky heart valve   Diabetes mellitus without complication (HCC)    Dysrhythmia    Fibromyalgia    GERD (gastroesophageal reflux disease)    Hypertension    Pneumonia    PUD (peptic ulcer disease)    Pulmonary HTN (HCC)    Rheumatic fever/heart disease    Sleep apnea     Past Surgical History:  Procedure Laterality Date   ADENOIDECTOMY     COLONOSCOPY WITH PROPOFOL N/A 11/06/2019   Procedure: COLONOSCOPY WITH PROPOFOL;  Surgeon: Virgel Manifold, MD;  Location: [facility] ENDOSCOPY;  Service: Endoscopy;  Laterality: N/A;  2 day prep    COLONOSCOPY WITH PROPOFOL N/A 06/29/2022   Procedure: COLONOSCOPY WITH PROPOFOL;  Surgeon: Lin Landsman, MD;  Location: [facility] ENDOSCOPY;  Service: Gastroenterology;  Laterality: N/A;   CORONARY ARTERY BYPASS GRAFT     June 27 2018   ESOPHAGOGASTRODUODENOSCOPY (EGD) WITH PROPOFOL N/A 05/25/2018   Procedure: ESOPHAGOGASTRODUODENOSCOPY (EGD) WITH PROPOFOL;  Surgeon: Lucilla Lame, MD;  Location: [facility] ENDOSCOPY;  Service: Endoscopy;  Laterality: N/A;   ESOPHAGOGASTRODUODENOSCOPY (EGD) WITH PROPOFOL N/A 11/06/2019   Procedure: ESOPHAGOGASTRODUODENOSCOPY (EGD) WITH PROPOFOL;  Surgeon: Virgel Manifold, MD;  Location: [facility] ENDOSCOPY;  Service: Endoscopy;  Laterality: N/A;    ESOPHAGOGASTRODUODENOSCOPY (EGD) WITH PROPOFOL N/A 06/29/2022   Procedure: ESOPHAGOGASTRODUODENOSCOPY (EGD) WITH PROPOFOL;  Surgeon: Lin Landsman, MD;  Location: [facility] ENDOSCOPY;  Service: Gastroenterology;  Laterality: N/A;   GIVENS CAPSULE STUDY N/A 07/22/2022   Procedure: GIVENS CAPSULE STUDY;  Surgeon: Lin Landsman, MD;  Location: [facility] ENDOSCOPY;  Service: Gastroenterology;  Laterality: N/A;   MITRAL VALVE REPLACEMENT     MULTIPLE TOOTH EXTRACTIONS     TONSILLECTOMY     TOTAL KNEE ARTHROPLASTY Right 09/04/2016   Procedure: TOTAL KNEE ARTHROPLASTY; with lateral release;  Surgeon: Earlie Server, MD;  Location: [place];  Service: Orthopedics;  Laterality: Right;    Medications Prior to Admission  Medication Sig Dispense Refill Last Dose   albuterol (VENTOLIN HFA) 108 (90 Base) MCG/ACT inhaler Inhale 1-2 puffs into the lungs every 6 (six) hours as needed for wheezing or shortness of breath.   Unknown at PRN   allopurinol (ZYLOPRIM) 100 MG tablet Take 1 tablet (100 mg total) by mouth 2 (two) times daily. 60 tablet 5 Past Week at Unknown   Cholecalciferol (VITAMIN D) 50 MCG (2000 UT) CAPS Take 1 capsule (2,000 Units total) by mouth daily. 30 capsule 5    colchicine 0.6 MG tablet Take 1 tablet (0.6 mg total) by mouth daily. As directed 30 tablet 5 Unknown at PRN   docusate sodium (COLACE) 100 MG capsule Take 100 mg by mouth 2 (two) times daily as needed for mild constipation or moderate constipation.   Unknown at PRN   empagliflozin (JARDIANCE) 10 MG TABS tablet Take 10 mg by mouth daily.   Past Week at Unknown   furosemide (LASIX) 40 MG tablet TAKE 2 TABLETS BY MOUTH IN THE MORNING AND 1 TABLET AT NIGHT (Patient taking differently: 40 mg 2 (two) times daily.) 90 tablet 5 Unknown at Unknown   gabapentin (NEURONTIN) 600 MG tablet Take 0.5 tablets (300 mg total) by mouth 2 (two) times daily. (Patient taking differently: Take 600 mg by mouth 3 (three) times daily.) 60 tablet 0 Past Week at  Unknown   ipratropium-albuterol (DUONEB) 0.5-2.5 (3) MG/3ML SOLN INHALE THE CONTENTS OF 1 VIAL(3MLS) VIA NEBULIZER 3 TIMES DAILY AS NEEDED 360 mL 1    metolazone (ZAROXOLYN) 2.5 MG tablet Take 5 mg by mouth 3 (three) times a week.   Past Week at Unknown   metoprolol tartrate (LOPRESSOR) 100 MG tablet Take 1 tablet (100 mg total) by mouth 2 (two) times daily. 60 tablet 3 Past Week at Unknown   nitroGLYCERIN (NITROSTAT) 0.4 MG SL tablet Place 1 tablet (0.4 mg total) under the tongue every 5 (five) minutes as needed for chest pain. 30 tablet 12 Unknown at PRN   omeprazole (PRILOSEC) 40 MG capsule Take 40 mg by mouth daily.   Past Week at Unknown   oxyCODONE ER 9 MG C12A Take 9 mg by mouth 2 (two) times daily.   Past Week at Unknown   potassium chloride (KLOR-CON) 10 MEQ tablet Take 4 tablets (40 mEq total) by mouth 2 (two) times daily. And additional 97mq on metolazone days (Patient taking differently: Take 40 mEq by mouth See admin instructions. Take 4 tablets (479m) by mouth twice daily - take an additional 2 tablets (2032m by mouth when taking metolazone) 64 tablet 5 Past Week at Unknown  promethazine (PHENERGAN) 12.5 MG tablet Take 1 tablet (12.5 mg total) by mouth every 8 (eight) hours as needed for nausea or vomiting. (Patient taking differently: Take 12.5 mg by mouth 2 (two) times daily as needed for nausea or vomiting.) 30 tablet 0 Unknown at PRN   rivaroxaban (XARELTO) 20 MG TABS tablet Take 20 mg by mouth daily with supper.   Past Week at Unknown   rosuvastatin (CRESTOR) 10 MG tablet Take 10 mg by mouth daily.   Past Week at Unknown   Iron-FA-B Cmp-C-Biot-Probiotic (FUSION PLUS) CAPS Take 1 capsule by mouth daily. (Patient not taking: Reported on 09/16/2022) 30 capsule 3 Not Taking   Social History   Socioeconomic History   Marital status: Married    Spouse name: Not on file   Number of children: Not on file   Years of education: Not on file   Highest education level: Not on file   Occupational History   Occupation: disabled  Tobacco Use   Smoking status: Every Day    Packs/day: 0.10    Types: Cigarettes   Smokeless tobacco: Never   Tobacco comments:    4/20 was not able to quit.  Still at 1-3 a day. She would like to wait to set a new quit date until we get back to class to have the extra in person support  Vaping Use   Vaping Use: Never used  Substance and Sexual Activity   Alcohol use: Never    Alcohol/week: 3.0 standard drinks of alcohol    Types: 3 Cans of beer per week    Comment: 16 oz per week   Drug use: Not Currently    Comment: Previous use of cocaine and marijuana last use 07/31/16    Sexual activity: Not Currently  Other Topics Concern   Not on file  Social History Narrative   Not on file   Social Determinants of Health   Financial Resource Strain: Low Risk  (10/02/2019)   Overall Financial Resource Strain (CARDIA)    Difficulty of Paying Living Expenses: Not hard at all  Food Insecurity: No Food Insecurity (09/18/2022)   Hunger Vital Sign    Worried About Running Out of Food in the Last Year: Never true    Ran Out of Food in the Last Year: Never true  Transportation Needs: No Transportation Needs (09/18/2022)   PRAPARE - Hydrologist (Medical): No    Lack of Transportation (Non-Medical): No  Physical Activity: Insufficiently Active (10/02/2019)   Exercise Vital Sign    Days of Exercise per Week: 7 days    Minutes of Exercise per Session: 20 min  Stress: No Stress Concern Present (10/02/2019)   [place]    Feeling of Stress : Not at all  Social Connections: Moderately Integrated (10/02/2019)   Social Connection and Isolation Panel [NHANES]    Frequency of Communication with Friends and Family: More than three times a week    Frequency of Social Gatherings with Friends and Family: More than three times a week    Attends Religious Services: 1 to  4 times per year    Active Member of [employer] or Organizations: Yes    Attends Archivist Meetings: 1 to 4 times per year    Marital Status: Never married  Intimate Partner Violence: Not At Risk (09/18/2022)   Humiliation, Afraid, Rape, and Kick questionnaire    Fear of Current or Ex-Partner: No  Emotionally Abused: No    Physically Abused: No    Sexually Abused: No    Family History  Problem Relation Age of Onset   COPD Mother    Cancer Mother        Bone   Asthma Mother    Rheum arthritis Mother    Congestive Heart Failure Father    Breast cancer Maternal Aunt      Vitals:   09/23/22 1203 09/23/22 1522 09/23/22 2138 09/24/22 0527  BP: 104/75 119/71 103/82 99/69  Pulse: 87 (!) 101 96 87  Resp: 18 19    Temp: 99.9 F (37.7 C) 98.7 F (37.1 C) 98.1 F (36.7 C)   TempSrc: Axillary Oral Oral   SpO2: 100% 96% 97% 95%  Weight:      Height:        PHYSICAL EXAM General: Chronically ill-appearing black female, well nourished, in no acute distress.  Sitting upright in hospital bed with PT at bedside. HEENT:  Normocephalic and atraumatic. Neck:  + JVD.  Lungs: Somewhat short of breath appearing on her baseline O2 by nasal cannula.  Decreased breath sounds bilaterally without appreciable crackles or wheezes.   Heart: Irregularly irregular with controlled rate.  Grade 3/6 systolic rumbling murmur and soft S2. abdomen: Non-distended appearing with a large pannus.   Msk: generalized weakness, appears very deconditioned Extremities: No clubbing, cyanosis.  1+ bilateral lower extremity edema, warm to touch, somewhat improved than yesterday. Neuro: lethargic and requires significant encouragement  Psych: anxious appearing.   Labs: Basic Metabolic Panel: Recent Labs    09/23/22 0546 09/23/22 1338 09/23/22 2131 09/24/22 0427  NA 144  --   --  147*  K 2.2*   < > 3.0* 3.1*  CL 97*  --   --  97*  CO2 34*  --   --  37*  GLUCOSE 84  --   --  90  BUN 32*  --   --  33*   CREATININE 1.25*  --   --  1.27*  CALCIUM 8.9  --   --  9.0  MG 1.9   < > 1.7 2.4   < > = values in this interval not displayed.    Liver Function Tests: Recent Labs    09/23/22 0546 09/24/22 0427  AST 54* 47*  ALT 29 27  ALKPHOS 69 76  BILITOT 9.8* 9.8*  PROT 6.6 6.6  ALBUMIN 2.5* 2.5*    No results for input(s): "LIPASE", "AMYLASE" in the last 72 hours. CBC: Recent Labs    09/22/22 0019 09/24/22 0427  WBC 5.8 13.7*  HGB 9.2* 9.7*  HCT 33.4* 35.2*  MCV 79.9* 81.1  PLT 172 127*    Cardiac Enzymes: No results for input(s): "CKTOTAL", "CKMB", "CKMBINDEX", "TROPONINIHS" in the last 72 hours. BNP: Recent Labs    09/23/22 0546  BNP 1,803.8*    D-Dimer: No results for input(s): "DDIMER" in the last 72 hours. Hemoglobin A1C: No results for input(s): "HGBA1C" in the last 72 hours. Fasting Lipid Panel: No results for input(s): "CHOL", "HDL", "LDLCALC", "TRIG", "CHOLHDL", "LDLDIRECT" in the last 72 hours. Thyroid Function Tests: No results for input(s): "TSH", "T4TOTAL", "T3FREE", "THYROIDAB" in the last 72 hours.  Invalid input(s): "FREET3" Anemia Panel: No results for input(s): "VITAMINB12", "FOLATE", "FERRITIN", "TIBC", "IRON", "RETICCTPCT" in the last 72 hours.   Radiology: ECHOCARDIOGRAM LIMITED  Result Date: 09/23/2022    ECHOCARDIOGRAM LIMITED REPORT   Patient Name:   Chelsea Ramsey Date of Exam: 09/23/2022 Medical  Rec #:  [id number]                 Height:       64.0 in Accession #:    [id number]                Weight:       243.4 lb Date of Birth:  16-Jan-1966                 BSA:          2.127 m Patient Age:    89 years                  BP:           109/71 mmHg Patient Gender: F                         HR:           96 bpm. Exam Location:  [facility] Procedure: Limited Echo Indications:     CHF I50.9  History:         Patient has prior history of Echocardiogram examinations, most                  recent 09/17/2022. CHF, Prior Cardiac Surgery, COPD; Risk                   Factors:Hypertension.                   Mitral Valve: bioprosthetic valve valve is present in the                  mitral position.  Sonographer:     Sherrie Sport Referring Phys:  [id number] Parnell Maudean Hoffmann Diagnosing Phys: Serafina Royals MD  Sonographer Comments: Technically difficult study due to poor echo windows, suboptimal apical window and suboptimal parasternal window. IMPRESSIONS  1. Left ventricular ejection fraction, by estimation, is 35 to 40%. The left ventricle has moderately decreased function. The left ventricle demonstrates global hypokinesis. The left ventricular internal cavity size was mildly dilated.  2. There is LV septal flattenting in systole and diastole and dilated hepatic vein consistant with severe phtn. Right ventricular systolic function is moderately reduced. The right ventricular size is moderately enlarged. Mildly increased right ventricular wall thickness. There is severely elevated pulmonary artery systolic pressure.  3. Left atrial size was severely dilated.  4. Right atrial size was severely dilated.  5. Cannot rule out mild stenosis. doppler difficult to interpret. The mitral valve is degenerative. Mild mitral valve regurgitation. 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  6. The tricuspid valve is abnormal. Tricuspid valve regurgitation is severe.  7. The aortic valve is calcified. Aortic valve regurgitation is moderate. FINDINGS  Left Ventricle: Left ventricular ejection fraction, by estimation, is 35 to 40%. The left ventricle has moderately decreased function. The left ventricle demonstrates global hypokinesis. The left ventricular internal cavity size was mildly dilated. Right Ventricle: There is LV septal flattenting in systole and diastole and dilated hepatic vein consistant with severe phtn. The right ventricular size is moderately enlarged. Mildly increased right ventricular wall thickness. Right ventricular systolic  function is  moderately reduced. There is severely elevated pulmonary artery systolic pressure. Left Atrium: Left atrial size was severely dilated. Right Atrium: Right atrial size was severely dilated. Pericardium: There is no evidence of pericardial effusion. Mitral Valve: Cannot rule out mild stenosis. doppler difficult to  interpret. The mitral valve is degenerative in appearance. Mild mitral valve regurgitation. 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 MV peak gradient, 19.2 mmHg. The mean mitral valve gradient is 11.0 mmHg. Tricuspid Valve: The tricuspid valve is abnormal. Tricuspid valve regurgitation is severe. Aortic Valve: The aortic valve is calcified. Aortic valve regurgitation is moderate. Pulmonic Valve: The pulmonic valve was normal in structure. Pulmonic valve regurgitation is mild. Aorta: The aortic root and ascending aorta are structurally normal, with no evidence of dilitation. IAS/Shunts: No atrial level shunt detected by color flow Doppler. LEFT VENTRICLE PLAX 2D LVIDd:         4.70 cm LVIDs:         3.10 cm LV PW:         0.90 cm LV IVS:        0.90 cm  RIGHT VENTRICLE RV Basal diam:  5.00 cm RV Mid diam:    6.50 cm LEFT ATRIUM         Index LA diam:    6.50 cm 3.06 cm/m   AORTA Ao Root diam: 2.90 cm MITRAL VALVE                TRICUSPID VALVE MV Area (PHT): 2.63 cm     TR Peak grad:   53.0 mmHg MV Peak grad:  19.2 mmHg    TR Vmax:        364.00 cm/s MV Mean grad:  11.0 mmHg MV Vmax:       2.19 m/s MV Vmean:      161.0 cm/s MV Decel Time: 288 msec MV E velocity: 239.00 cm/s Serafina Royals MD Electronically signed by Serafina Royals MD Signature Date/Time: 09/23/2022/4:02:55 PM    Final    ECHOCARDIOGRAM COMPLETE  Result Date: 09/17/2022    ECHOCARDIOGRAM REPORT   Patient Name:   Chelsea Ramsey Date of Exam: 09/17/2022 Medical Rec #:  [id number]                 Height:       64.0 in Accession #:    [id number]                Weight:       265.0 lb Date of Birth:  [birth year]                  BSA:          2.205 m Patient Age:    77 years                  BP:           93/79 mmHg Patient Gender: F                         HR:           110 bpm. Exam Location:  [facility] Procedure: 2D Echo, Color Doppler and Cardiac Doppler Indications:     I50.31 CHF-Acute Diastolic  History:         Patient has prior history of Echocardiogram examinations, most                  recent 11/05/2018. CHF, CAD, COPD; Risk Factors:Hypertension,                  Diabetes and Sleep Apnea.  Sonographer:     Charmayne Sheer Referring Phys:  [id number] [place] Diagnosing Phys: Yolonda Kida  MD  Sonographer Comments: Suboptimal apical window and no subcostal window. Image acquisition challenging due to patient body habitus and Image acquisition challenging due to COPD. IMPRESSIONS  1. Left ventricular ejection fraction, by estimation, is 20 to 25%. The left ventricle has severely decreased function. The left ventricle demonstrates global hypokinesis. Left ventricular diastolic parameters are consistent with Grade III diastolic dysfunction (restrictive). There is the interventricular septum is flattened in systole and diastole, consistent with right ventricular pressure and volume overload.  2. Right ventricular systolic function is moderately reduced. The right ventricular size is moderately enlarged. Mildly increased right ventricular wall thickness.  3. Left atrial size was moderately dilated.  4. Right atrial size was severely dilated.  5. The mitral valve is abnormal. Mild to moderate mitral valve regurgitation.  6. Tricuspid valve regurgitation is severe.  7. The aortic valve is calcified. Aortic valve regurgitation is mild to moderate. Aortic valve sclerosis/calcification is present, without any evidence of aortic stenosis. Conclusion(s)/Recommendation(s): Poor windows for evaluation of left ventricular function by transthoracic echocardiography. Would recommend an alternative means of evaluation. FINDINGS   Left Ventricle: Left ventricular ejection fraction, by estimation, is 20 to 25%. The left ventricle has severely decreased function. The left ventricle demonstrates global hypokinesis. The left ventricular internal cavity size was normal in size. There is borderline concentric left ventricular hypertrophy. The interventricular septum is flattened in systole and diastole, consistent with right ventricular pressure and volume overload. Left ventricular diastolic parameters are consistent with Grade III diastolic dysfunction (restrictive). Right Ventricle: The right ventricular size is moderately enlarged. Mildly increased right ventricular wall thickness. Right ventricular systolic function is moderately reduced. Left Atrium: Left atrial size was moderately dilated. Right Atrium: Right atrial size was severely dilated. Pericardium: There is no evidence of pericardial effusion. Mitral Valve: The mitral valve is abnormal. Mild to moderate mitral valve regurgitation. Tricuspid Valve: The tricuspid valve is grossly normal. Tricuspid valve regurgitation is severe. Aortic Valve: The aortic valve is calcified. Aortic valve regurgitation is mild to moderate. Aortic regurgitation PHT measures 233 msec. Aortic valve sclerosis/calcification is present, without any evidence of aortic stenosis. Aortic valve mean gradient measures 6.0 mmHg. Aortic valve peak gradient measures 11.3 mmHg. Aortic valve area, by VTI measures 1.14 cm. Pulmonic Valve: The pulmonic valve was normal in structure. Pulmonic valve regurgitation is mild to moderate. Aorta: The ascending aorta was not well visualized. IAS/Shunts: No atrial level shunt detected by color flow Doppler.  LEFT VENTRICLE PLAX 2D LVIDd:         3.50 cm LVIDs:         3.20 cm LV PW:         1.20 cm LV IVS:        0.90 cm LVOT diam:     1.80 cm LV SV:         25 LV SV Index:   12 LVOT Area:     2.54 cm  RIGHT VENTRICLE RV Basal diam:  5.30 cm RV Mid diam:    5.00 cm RV S prime:      7.62 cm/s LEFT ATRIUM         Index       RIGHT ATRIUM           Index LA diam:    5.50 cm 2.49 cm/m  RA Area:     28.50 cm  RA Volume:   104.00 ml 47.17 ml/m  AORTIC VALVE                     PULMONIC VALVE AV Area (Vmax):    0.87 cm      PV Vmax:          0.98 m/s AV Area (Vmean):   0.90 cm      PV Vmean:         69.200 cm/s AV Area (VTI):     1.14 cm      PV VTI:           0.128 m AV Vmax:           168.00 cm/s   PV Peak grad:     3.9 mmHg AV Vmean:          116.000 cm/s  PV Mean grad:     2.5 mmHg AV VTI:            0.223 m       PR End Diast Vel: 10.24 msec AV Peak Grad:      11.3 mmHg AV Mean Grad:      6.0 mmHg LVOT Vmax:         57.60 cm/s LVOT Vmean:        40.900 cm/s LVOT VTI:          0.100 m LVOT/AV VTI ratio: 0.45 AI PHT:            233 msec  AORTA Ao Root diam: 3.10 cm MITRAL VALVE               TRICUSPID VALVE MV Area (PHT): 5.95 cm    TR Peak grad:   55.7 mmHg MV Decel Time: 128 msec    TR Vmax:        373.00 cm/s MV E velocity: 87.60 cm/s                            SHUNTS                            Systemic VTI:  0.10 m                            Systemic Diam: 1.80 cm Yolonda Kida MD Electronically signed by Yolonda Kida MD Signature Date/Time: 09/17/2022/4:54:34 PM    Final    DG Chest 1 View  Result Date: 09/16/2022 CLINICAL DATA:  Worsening shortness of breath. 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 Cardiomegaly which has increased from 2021. Slight globular configuration of the heart, query pericardial effusion. Suspect small right pleural effusion. Chronic bilateral lung opacities that appears stable. No acute airspace disease or pneumothorax. On limited assessment, no acute osseous findings. IMPRESSION: 1. Increased cardiomegaly from 2021. Slight globular configuration of the heart, query pericardial effusion.  2. Suspect small right pleural effusion. 3. Stable chronic bilateral lung opacities. Electronically Signed   By: Keith Rake M.D.   On: 09/16/2022 17:21   MR ABDOMEN MRCP WO CONTRAST  Result Date: 09/16/2022 CLINICAL DATA:  56 year old female with history of jaundice. Nausea. EXAM: MRI ABDOMEN WITHOUT CONTRAST  (INCLUDING MRCP) TECHNIQUE: Multiplanar multisequence MR imaging of the abdomen was performed. Heavily  T2-weighted images of the biliary and pancreatic ducts were obtained, and three-dimensional MRCP images were rendered by post processing. COMPARISON:  No prior abdominal MRI or MRCP. Abdominal ultrasound 09/15/2022. FINDINGS: Comment: Portions of today's examination are substantially limited by patient respiratory motion. The study is also severely limited for detection and characterization of visceral and/or vascular lesions by lack of IV gadolinium. Lower chest: Severe cardiomegaly. Massively distended inferior vena cava. Hepatobiliary: No definite suspicious cystic or solid hepatic lesions are confidently identified on today's motion limited noncontrast examination. No intra or extrahepatic biliary ductal dilatation noted on MRCP images. Common bile duct measures only 3 mm in the porta hepatis. No filling defect within the common bile duct to suggest choledocholithiasis. T1 hyperintense, T2 hypointense material lies dependently within the gallbladder. Gallbladder does not appear overly distended. Accurate assessment for gallbladder wall thickening is limited on today's examination secondary to motion, but there may be some very mild gallbladder wall thickening and edema. No overt pericholecystic fluid or surrounding inflammatory changes. Pancreas: No definite pancreatic mass or peripancreatic fluid collections or inflammatory changes noted on today's noncontrast examination. No pancreatic ductal dilatation. Spleen:  Unremarkable. Adrenals/Urinary Tract: Unenhanced appearance of the kidneys and  bilateral adrenal glands is normal. No hydroureteronephrosis in the visualized portions of the abdomen. Stomach/Bowel: Visualized portions are unremarkable. Vascular/Lymphatic: No aneurysm identified in the visualized abdominal vasculature. No lymphadenopathy noted in the abdomen. Other: No significant volume of ascites noted in the visualized portions of the peritoneal cavity. Musculoskeletal: No aggressive appearing osseous lesions are noted in the visualized portions of the skeleton. IMPRESSION: 1. Biliary sludge lying dependently in the gallbladder. Gallbladder wall may be mildly edematous, but is poorly evaluated on today's motion limited examination. If there is any clinical concern for acute cholecystitis, further evaluation with right upper quadrant abdominal ultrasound or nuclear medicine hepatobiliary scan should be considered at this time. 2. No biliary tract dilatation to suggest obstruction. No evidence of choledocholithiasis. 3. Severe cardiomegaly. Electronically Signed   By: Vinnie Langton M.D.   On: 09/16/2022 05:28   US ABDOMEN LIMITED RUQ (LIVER/GB)  Result Date: 09/15/2022 CLINICAL DATA:  Elevated bilirubin EXAM: ULTRASOUND ABDOMEN LIMITED RIGHT UPPER QUADRANT COMPARISON:  CT abdomen and pelvis 12/01/2018 FINDINGS: Gallbladder: Sludge is present within the gallbladder. Small amount of pericholecystic fluid. The gallbladder wall is mildly thickened measuring 5 mm. Sonographic Murphy's sign was positive. Common bile duct: Diameter: 3 mm Liver: No focal lesion identified. Increased echogenicity. Portal vein is patent on Doppler imaging with bidirectional flow. Other: None. IMPRESSION: 1. Findings compatible with acute cholecystitis. 2. 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 Electronically Signed   By: Placido Sou M.D.   On: 09/15/2022 23:07    L/RHC 05/13/2018 Date of Procedure: 05/06/18   ___________________________________________________________________________  _  Findings:  1. No angiographically apparent coronary artery disease.  2. Elevated pulmonary capillary (23 mmHg)  3. Preserved cardiac output (6.2) and index (2.8)   Recommendations:  1. Intensive secondary prevention.  2. Follow up with primary cardiologist.   Complications:   None  ___________________________________________________________________________  _  Referring Physician: Vella Raring, MD  Primary Cardiologist: Vella Raring, MD  Primary Care Physician: Kathee Delton   Performing Attending: Nena Polio, MD  Diagnostic Fellow: Lorrene Reid, MD  Interventional Fellow: NA   Procedures performed: Right Heart Catheterization and Coronary angiography  Access Site: Right Radial Artery  Arterial Closure: TR Band   Contrast Volume: 30 mL  Fluoroscopy Time:  6.3 mins  Radiation Dose: 372 mGy  ___________________________________________________________________________  _  HISTORY   56 yo female with history of HTN, moderate to severe MR and 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     FINDINGS   Right Heart Catheterization   RA mean: 11 mmHg  RV: 63/1 (11) mmHg  PA (mean): 64/29 (44) mmHg  PCWP mean: 23 mmHg  PA Sat: 60 %  Art Sat: 95 %  Fick Cardiac output: 6.2 L/min  Fick Cardiac index: 2.8 L/min/m2   Hemodynamics and Coronary Angiography:    Aortic pressure: 104/73 mmHg   Coronary Angiography   Dominance: Right   Left main (LMCA): LMCA is a large-caliber vessel that originates from  the left coronary sinus. It bifurcates into the left anterior descending  (LAD) and left circumflex (LCx) arteries. There is no angiographic  evidence of significant disease in the LMCA.   Left anterior descending (LAD): The LAD is a large-caliber  vessel that  gives off two diagonal (D) branches before it wraps around the apex. D1 is  a moderate caliber vessel with a high take-off. D2 is a small caliber  vessel. There is no angiographic evidence of significant disease in the  LAD.   Circumflex (LCx): The LCx is a medium-caliber vessel that gives off two  obtuse marginal (OM) branches and then continues as a small vessel in the  AV groove. OM1 is a small caliber vessel. OM2 is a moderate caliber  branching vessel. There is no angiographic evidence of significant disease  in the LCx.   Right Coronary (RCA): The RCA is a large-caliber vessel originating from  the right coronary sinus. It bifurcates distally into the posterior  descending artery (PDA) and three posterolateral branches (PL) consistent  with a right dominant system. There is no angiographic evidence of  significant disease in the RCA.   TELEMETRY reviewed by me (LT) 09/24/2022 : Atrial fibrillation rate 90s to low 100s  EKG reviewed by me: Atrial flutter with 4 1 conduction rate 84, QTc 470 on 10/23  Data reviewed by me (LT) 09/24/2022:  hospitalist progress note, PT ntoes, nursing notes, CBC, CMP, INR, BNP, vitals, telemetry, I/O, repeat K+  Principal Problem:   Acute on chronic HFrEF (heart failure with reduced ejection fraction) (HCC) Active Problems:   A-fib (HCC)   HTN (hypertension)   Chronic low back pain (Primary Area of Pain) (Bilateral) w/ sciatica (Left)   Class 3 severe obesity with body mass index (BMI) of 45.0 to 49.9 in adult (HCC)   Moderate to severe pulmonary hypertension (HCC)   Tobacco dependence   History of cocaine use   Iron deficiency anemia   Abdominal pain   AKI (acute kidney injury) ([place])   Elevated LFTs   Hypokalemia   Hypomagnesemia   Acute metabolic encephalopathy   Epistaxis    ASSESSMENT AND PLAN:  Chelsea Ramsey is a 76yoF with a PMH of HFrEF (current EF 35-40% G3 DD, global hypokinesis, previous EF >55% without MR or  AR 2022), 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  Labs on admission showed acute renal failure (improved with diuresis), transaminitis, and hyperbilirubinemia, ultimately attributed to volume overload from her heart failure exacerbation.  Initial echo on 10/19 revealed a newly reduced LVEF with moderately reduced RV function, and severe TR. Hospital course complicated by atrial fibrillation with RVR and persistently elevated T. bili.  Ongoing primary complaint is generalized abdominal pain. Repeat limited echo on 10/25 after aggressive diuresis showed persistent dilation of hepatic vein, moderate RV enlargement, severely elevated PASP, and severe TR, c/w severe pulmonary hypertension.  #severe pulmonary hypertension #RV dysfunction Seen on repeat echo 10/25 with severe TR, moderately reduced RV function, severely elevated PASP. [place] in 2019 showed mildly elevated PA pressure 23 mmHg.  She was last seen by pulmonology in 04/2020 for her COPD/asthma but does not seem to be on therapy specific for pulmonary hypertension. Again, unclear what led to her decompensation, per chart review she had not been feeling well (abdominal pain, weakness) since the end of August after her upper endoscopy. -We will continue diuresis as below -recommend outpatient follow up with pulmonology (established with Dr. Lanney Gins)  #Acute on chronic HFrEF (EF 35-40%, global hypokinesis and G3 DD, RV dilation and dysfunction, mild to moderate MR, AR) #Rheumatic mitral valve stenosis s/p MVR 2019 #Abdominal pain BNP on admission was elevated at 558.5 and uptrending to 1800 on 10/25 despite diuresis. Echo 10/19 shows significant reduction  in EF 20-25% while in AF RVR from >55% 06/2021 and new AR and MR compared to prior study. Repeat on 10/25 while rate was better controlled and after diuresis was 35-40%. Her abdominal pain was ultimately attributed to volume overload/hepatic insufficiency after acute cholecystitis, choledocholithiasis, and a hematologic disorder were ruled out by general surgery, GI, and hematology.  She remains clinically volume overloaded appearing on 10/26 but somewhat improved, although labs are trending toward euvolemia. -S/p IV Lasix 40 mg x 11 doses  -Continue IV Lasix 40 mg twice daily for now -S/p metolazone 5 mg x 1 10/24 with brisk diuresis and significant improvement in renal function, favor redosing after recheck of potassium this afternoon -Continue spironolactone 12.5 mg once daily -Continue potassium replacement 40 mEq twice daily -Home diuretics are Lasix 40 p.o. once daily, metolazone 5 mg 3 times weekly  #Paroxysmal atrial fibrillation with RVR, now in atrial flutter Was previously managed well on metoprolol tartrate 100 mg twice daily.  Tachyarrhythmia on admission worsening her RV dysfunction. -S/p sotalol 80 mg once daily (started on 10/20) due to QTc prolongation (401 on 10/21, 470 on 10/23).  Not a great option with aggressive diuresis, persistent hypokalemia. -Intolerant of amiodarone (N/V), digoxin not initiated on admission due to acute renal failure -continue metoprolol tartrate 25 mg every 8 hours (home dose 100 mg twice daily)  -Discontinue diltiazem due to reduced EF and hypotension -She was on a heparin infusion on admission, has been switched back to her Xarelto, 20 mg okay per her total body weight and creatinine clearance per pharmacy on 10/23. -Xarelto held with hemoptysis/epistaxis overnight on 10/23 restart today, 10/26.  Would recommend GI input for further classification of her hepatic dysfunction, if they recommend switching from a DOAC to another anticoagulant based on this.   Hopefully, her hepatic function (T bili) will improve with diuresis. -Continue rate control strategy, consider DCCV on an outpatient basis after 3 weeks of appropriate anticoagulation -Monitor and replete electrolytes to maintain a K >4, mag >2  #Transaminitis, hyperbilirubinemia s/p MRCP #elevated INR, resolved #Acute encephalopathy, resolved -t bili has remained elevated  since admission, back up to 9.8 today (peak of 9.8 on 10/19), ammonia improved from 135 with lactulose to 49 on 10/25. INR improved after vitamin K.   -Encephalopathy felt to be related to opioid analgesics, which have now been discontinued  #AKI Presented with a BUN/creatinine of 50/3.52 and GFR 15, much improved after metolazone x1 on 10/24  -Current renal function BUN/creatinine 33/1.27 and GFR 50 -Daily CMP  This patient's plan of care was discussed and created with Dr. Nehemiah Massed and he is in agreement.  Signed: Tristan Schroeder , PA-C 09/24/2022, 7:40 AM [facility]

## 2022-09-24 NOTE — Progress Notes (Signed)
   09/24/22 1300  Clinical Encounter Type  Visited With Patient and family together  Visit Type Follow-up   Chaplain followed up on earlier visit when patient was sleeping. This time patient had company and they were working through some important paperwork. Chaplain said that when she was ready for a visit to let nurse know to call Chaplain.

## 2022-09-24 NOTE — TOC Progression Note (Addendum)
Transition of Care [facility]) - Progression Note    Patient Details  Name: Chelsea Ramsey MRN: [id number] Date of Birth: 1966-10-03  [facility] CM/SW [place], LCSW Phone Number: 09/24/2022, 10:24 AM  Clinical Narrative:  Left voicemail for patient's mother. Will provide bed offers once she calls back.   12:19 pm: Called mom again. Patient's bed offers are [facility] and [facility] in [place] and [facility] in [place]. Mom does not want her placed outside of [facility]. All local SNF's declined. Mom prefers to bring her home. Starting home health search. So far [facility], [place], [place], [place], [place], [place], and [facility] are unable to accept. Left messages for [facility], and [facility].  Expected Discharge Plan and Services                                                 Social Determinants of Health (SDOH) Interventions    Readmission Risk Interventions     No data to display

## 2022-09-24 NOTE — Progress Notes (Signed)
[facility] Progress Note  Patient: Chelsea Ramsey    OIT:[id number]  DOA: 09/15/2022     Date of Service: the patient was seen and examined on 09/24/2022  Chief Complaint  Patient presents with   Renal Problem   Brief hospital course: Taken from prior notes.   Patient is 56 year old female with DM, pulm HTN, COPD, CHF, GERD, OSA was seen by PCP on day of admission for not feeling well and abd pain for about a month. Patient complained of pain in RUQ and right flank with nausea.   abnormal outpatient labs showing AKI.   In ED, her labwork confirmed AKI with Cr 3.54, total bilirubin of 9.6.  AST mildly elevated to 57, ALT 35, Alk Phos 80.  U/S was done which showed gallbladder wall thickening to 5 mm with pericholecystic fluid, suspicious for acute cholecystitis.  CBD per U/S was 3 mm.   Surgery was consulted, recommended MRCP - MRCP showed biliary sludge, GB mild edematous. No biliary tract dilation to suggest obstruction.  No choledocholithiasis. 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   Also found to have acute systolic on chronic diastolic heart failure.  Echocardiogram shows EF of 20 to 25% with grade 3 diastolic dysfunction, right ventricular pressure and volume overload.  Prior echo done in December 2019 with a EF of 50 to 55%.  She was started on IV diuresis.   As blood pressure remained soft which interfere with other medical management, she was started on midodrine.  Remained in A-fib with intermittently going in RVR.  Missed Cardizem doses yesterday due to softer blood pressure.  Patient was on heparin infusion for the past 5 days, do not see any plan for cardiac cath by cardiology.  No clear  recommendations. CMP remained with elevated T. bili at 9.1, she continued to have upper abdominal pain. Concern of cardiohepatic with hepatic congestion secondary to heart failure. Secure chat with Dr. Humphrey Rolls and we will discontinue heparin infusion today and restart her home Xarelto.  Patient will get benefit from ischemic work-up with this new HFrEF. Norco switched with oxycodone immediate release to see if that will better serve her pain. No recorded urinary output as she was receiving IV diuresis-message sent to nursing staff. Potassium at 3.1 which is being repleted.  Renal functions continue to improve slowly.   10/23: Discussed with Dr. Nehemiah Massed on chat, there is no need for immediate ischemic work-up. T. bili started improving, at 8.5 today.  Rest of the labs improved.  Renal function stable. Pain with better control after changing to oxycodone.  Awaiting disposition.   10/24: Patient became more lethargic and somnolent in the evening, ABG and ammonia levels were checked and found to have CO2 of 59 and ammonia of 135.  Had an episode of self-limiting epistaxis followed by coughing up some blood overnight.  Patient is on Xarelto for atrial flutter, which is being held. Lactulose enema was added as she is unable to take much p.o. Remained very lethargic and somnolent this morning.  CMP with improvement in creatinine, T. bili at 8.8 with INR of 3.6.  Concern of hepatic failure, another message sent to GI.  Recent liver ultrasound with hepatic steatosis and portal hypertension, no cirrhosis.  VBG was mostly within normal limit except mildly elevated  bicarb. Repeat ammonia levels with some improvement to 65. Currently unable to swallow as pocketing in her mouth, able to answer orientation questions appropriately but response was very slow. Palliative care consult was also placed.   10/25: Patient little more alert and oriented.  Had multiple bowel movements with lactulose enema yesterday,  ammonia at 49.  Worsening BNP at 12/07/2001 today, significant hypokalemia with potassium of 2.2, which is being repleted.  Magnesium at 1.9.  Worsening of T. bili at 9.8 And some improvement of creatinine to 1.25.  Cardiology ordered another limited echocardiogram for reevaluation of her volume status and heart failure. GI started her on rifaximin.  She also received vitamin K with some improvement of INR to 2.5 today.   Patient will remain very high risk for deterioration and mortality.  Currently full code and palliative is on board.  She does not want to talk about her wishes and would like her mother to be a Media planner.    Assessment and Plan:  # Acute on chronic HFrEF (heart failure with reduced ejection fraction)  10/25 TTE shows LVEF 35 to 40%, LV function moderately decreased, global hypokinesis, severe pulmonary hypertension right ventricle function moderately reduced, and moderately enlarged.  Severely dilated right and left atrium. Cardiology was consulted-no plan for more ischemic work-up during current hospitalization as she has clean coronaries in 2019.  Renal function stable with some concern of worsening hepatic function.  T. bili worsening with some improvement of renal function. -Continue with IV Lasix 40 mg twice daily. -Need strict intake and output -Daily weight and CMP -Continue to monitor   Epistaxis No more episode. -Holding Xarelto-concern of hepatic coagulopathy and liver failure -Continue to monitor   Acute metabolic encephalopathy Patient has a picture of hepatic encephalopathy without any diagnosis of cirrhosis.  Concern of cardiohepatic.   Child's Pugh class C (10 point), not a good candidate for anticoagulation, high risk of bleeding   Recent liver ultrasound with hepatic steatosis and elevated portal pressures.  INR improved to 2.5 after getting 1 dose of vitamin K, worsening T. bili to 9.8, liver enzymes mostly within normal limit.   -Continue  lactulose-dose decreased to twice daily due to excessive bowel movements, need to titrate for 2-3 soft bowel movement daily -Continue to monitor    Abdominal pain Pt c/o right sided abd pain for one month.  Work-up so far with MRCP shows biliary sludge, mildly edematous gallbladder, no biliary tract dilation to suggest obstruction.  No choledocholithiasis. 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  T. bili with some worsening at 9.8, iron 2.5 -Gastroenterology discontinued opioids. -Can do low-dose tramadol if needed.     Elevated LFTs Most likely secondary to congestive hepatopathy, also concern of Nash liver disease.  Worsening liver synthetic function. GI and general surgery signed off, initially but GI was reconsulted. Patient is being diuresed. -Continue to monitor   AKI (acute kidney injury) Patient presented with creatinine of 3.54 with baseline around 1. Slowly improving, creatinine at 1.25--1.27  concern of cardiorenal -Monitor renal function while she is being diuresed -Avoid renal toxins   HTN (hypertension) Blood pressure remained on lower normal limit. She was started on midodrine with some improvement. -Continue with midodrine -Monitor closely when she is being diuresed with Lasix  amiodarone allergies were noted.       Iron deficiency anemia Pt has h/o IDA / is currently anemic and is on xarelto for a/fib. In afib rvr on ekg.  Pt being followed by Dr.vanga for her IDA and Eval for GI loss. GI eval showed: Endoscopy Shows Studies Complaint Capsule History Speak, Normal Small Bowel Transit, Scattered Small Bowel Lymphangiectasia's, No Source of Iron Deficiency Anemia Identified - 08/18/2022. Endoscopy shows erythematous  mucosa in the antrum, normal duodenal bulb and second portion of duodenum, colonoscopy showed melanosis coli but was otherwise normal 06/29/2022.  Hematology was also consulted, low suspicion for  valvular cause of hemolytic anemia, low suspicion for TTF/HUS. Currently hemoglobin stable. Can-continue to monitor -Continue with iron supplement-restarting home meds.   A-fib (HCC) Remained in A-fib with going in and out of RVR.  Concern of atrial flutter -Continue with beta-blocker . -Xarelto is being held due to recent episode of epistaxis and mild hemoptysis and INR  3.6>2.5 -Patient will need cardioversion as outpatient per cardiology   Tobacco dependence -Nicotine patch.    History of cocaine use UDS done on 09/16/2022 were positive for benzodiazepines, opioids and tricyclic.     Chronic low back pain (Primary Area of Pain) (Bilateral) w/ sciatica (Left) Seems to have an history of chronic pain syndrome and fibromyalgia. She was on oxycodone and Neurontin at home. -Restarting oxycodone and Neurontin   Class 3 severe obesity with body mass index (BMI) of 45.0 to 49.9 in adult [facility]) Estimated body mass index is 42.76 kg/m as calculated from the following:   Height as of this encounter: $RemoveBeforeD'5\' 4"'VIqxahesKpiNRh$  (1.626 m).   Weight as of this encounter: 113 kg.    -This will complicate overall prognosis   Hypokalemia Potassium decreased to 2.2 after having multiple bowel movements.  Magnesium at 1.9.  Ordered repletion through IV and p.o. Replace potassium and monitor   Hypomagnesemia Improved with magnesium of 1.9 today -Replete magnesium as needed and monitor   Moderate to severe pulmonary hypertension (HCC) Chronic respiratory failure. Patient is on 3 to 4 L of oxygen at home.  Currently at baseline. -Continue to monitor    Body mass index is 41.78 kg/m.  Nutrition Problem: Inadequate oral intake Etiology: lethargy/confusion Interventions: Interventions: Ensure Enlive (each supplement provides 350kcal and 20 grams of protein), Magic cup, MVI, Prostat  Diet: Dysphagia 3 diet  DVT Prophylaxis: SCD, pharmacological prophylaxis contraindicated due to high risk of bleeding     Advance goals of care discussion: DNR  Family Communication: family was NOT present at bedside, at the time of interview.  The pt provided permission to discuss medical plan with the family. Opportunity was given to ask question and all questions were answered satisfactorily.   Disposition:  Pt is from Home, admitted with hepatic anticoagulopathy, CHF and volume overload, still has confusion and erythematous, which precludes a safe discharge. Discharge to SNF, when clinically stable, may require few days more.  Subjective: No significant overnight events, patient is slightly confused, AO x2, complaining of back pain and mild abdominal pain.  Physical Exam: General:  lethargic oriented to place and person.  Appear in mild distress, affect appropriate Eyes: PERRLA ENT: Oral Mucosa Clear, moist  Neck: no JVD,  Cardiovascular: Irregular rhythm, no Murmur appreciated,  Respiratory: good respiratory effort, Bilateral Air entry equal and Decreased, mild Crackles, no wheezes Abdomen: Bowel Sound present, Soft and mild generalized tenderness,  Skin: No rashes Extremities: 2-3+ Pedal edema, no calf tenderness Neurologic: without any new focal findings Gait not checked due to patient safety concerns  Vitals:   09/23/22 2138 09/24/22 0527 09/24/22 0747 09/24/22 1145  BP: 103/82 99/69 101/76 112/82  Pulse: 96 87 93 (!)  102  Resp:   17 19  Temp: 98.1 F (36.7 C)  98.7 F (37.1 C) 98.6 F (37 C)  TempSrc: Oral  Axillary Oral  SpO2: 97% 95% 96% 99%  Weight:      Height:        Intake/Output Summary (Last 24 hours) at 09/24/2022 1444 Last data filed at 09/24/2022 1146 Gross per 24 hour  Intake 1081.49 ml  Output 1400 ml  Net -318.51 ml   Filed Weights   09/19/22 0541 09/21/22 0500 09/22/22 0500  Weight: 113 kg 113.9 kg 110.4 kg    Data Reviewed: I have personally reviewed and interpreted daily labs, tele strips, imagings as discussed above. I reviewed all nursing notes,  pharmacy notes, vitals, pertinent old records I have discussed plan of care as described above with RN and patient/family.  CBC: Recent Labs  Lab 09/19/22 0447 09/20/22 0317 09/21/22 0555 09/22/22 0019 09/24/22 0427  WBC 5.8 5.5 5.4 5.8 13.7*  HGB 9.1* 8.9* 9.0* 9.2* 9.7*  HCT 31.8* 31.4* 32.1* 33.4* 35.2*  MCV 77.6* 78.3* 77.5* 79.9* 81.1  PLT 203 174 156 172 737*   Basic Metabolic Panel: Recent Labs  Lab 09/20/22 0317 09/21/22 0548 09/21/22 0555 09/22/22 0439 09/22/22 0839 09/23/22 0546 09/23/22 1338 09/23/22 2131 09/24/22 0427  NA 139  --  137 141  --  144  --   --  147*  K 3.1*  --  3.7 4.1  --  2.2* 2.7* 3.0* 3.1*  CL 100  --  98 99  --  97*  --   --  97*  CO2 31  --  28 30  --  34*  --   --  37*  GLUCOSE 78  --  75 80  --  84  --   --  90  BUN 37*  --  35* 36*  --  32*  --   --  33*  CREATININE 1.88*  --  1.89* 1.66*  --  1.25*  --   --  1.27*  CALCIUM 8.4*  --  8.6* 8.7*  --  8.9  --   --  9.0  MG  --    < >  --   --  1.6* 1.9 1.8 1.7 2.4   < > = values in this interval not displayed.    Studies: No results found.  Scheduled Meds:  (feeding supplement) PROSource Plus  30 mL Oral TID BM   acidophilus  1 capsule Oral Daily   allopurinol  100 mg Oral Daily   docusate sodium  100 mg Oral BID   feeding supplement  237 mL Oral TID BM   furosemide  40 mg Intravenous Q12H   gabapentin  300 mg Oral BID   lactulose  30 g Oral BID   lactulose  300 mL Rectal BID   metoprolol tartrate  25 mg Oral TID   midodrine  10 mg Oral TID WC   multivitamin with minerals  1 tablet Oral Daily   pantoprazole (PROTONIX) IV  40 mg Intravenous Q12H   potassium chloride  40 mEq Oral BID   rifaximin  550 mg Oral BID   rivaroxaban  20 mg Oral Daily   sodium chloride flush  3 mL Intravenous Q12H   sodium chloride flush  3 mL Intravenous Q12H   spironolactone  12.5 mg Oral Daily   Continuous Infusions:  sodium chloride     PRN Meds: sodium chloride, acetaminophen **OR**  acetaminophen, albuterol, metoprolol  tartrate, nitroGLYCERIN, oxymetazoline, prochlorperazine, sodium chloride flush, traZODone  Time spent: 35 minutes  Author: Val Riles. MD [facility] 09/24/2022 2:44 PM  To reach On-call, see care teams to locate the attending and reach out to them via [email or web address]. If 7PM-7AM, please contact night-coverage If you still have difficulty reaching the attending provider, please page the [facility] (Director on Call) for [facility] on amion for assistance.

## 2022-09-24 NOTE — Progress Notes (Signed)
       CROSS COVER NOTE  NAME: Chelsea Ramsey MRN: [id number] DOB : Jul 04, 1966       HPI/Events of Note   Nurse report emesis with bright red blood. Xerelto therapy had just restarted day prior  Assessment and  Interventions   Assessment: VSS.  Plan: Monitor Follow up with cbc results
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
## 2022-09-24 NOTE — Progress Notes (Signed)
   09/24/22 0900  Clinical Encounter Type  Visited With Patient  Visit Type Initial  Referral From Nurse  Consult/Referral To Chaplain   Chaplain responded to [facility] consult. Patient sleeping soundly. Chaplain will follow up.

## 2022-09-25 ENCOUNTER — Inpatient Hospital Stay: Payer: Medicaid Other

## 2022-09-25 DIAGNOSIS — N179 Acute kidney failure, unspecified: Secondary | ICD-10-CM | POA: Diagnosis not present

## 2022-09-25 DIAGNOSIS — G9341 Metabolic encephalopathy: Secondary | ICD-10-CM | POA: Diagnosis not present

## 2022-09-25 DIAGNOSIS — I272 Pulmonary hypertension, unspecified: Secondary | ICD-10-CM

## 2022-09-25 DIAGNOSIS — F17210 Nicotine dependence, cigarettes, uncomplicated: Secondary | ICD-10-CM

## 2022-09-25 DIAGNOSIS — R040 Epistaxis: Secondary | ICD-10-CM

## 2022-09-25 DIAGNOSIS — I5023 Acute on chronic systolic (congestive) heart failure: Secondary | ICD-10-CM | POA: Diagnosis not present

## 2022-09-25 DIAGNOSIS — I480 Paroxysmal atrial fibrillation: Secondary | ICD-10-CM | POA: Diagnosis not present

## 2022-09-25 LAB — BASIC METABOLIC PANEL
Anion gap: 11 (ref 5–15)
BUN: 32 mg/dL — ABNORMAL HIGH (ref 6–20)
CO2: 38 mmol/L — ABNORMAL HIGH (ref 22–32)
Calcium: 9.1 mg/dL (ref 8.9–10.3)
Chloride: 97 mmol/L — ABNORMAL LOW (ref 98–111)
Creatinine, Ser: 1.1 mg/dL — ABNORMAL HIGH (ref 0.44–1.00)
GFR, Estimated: 59 mL/min — ABNORMAL LOW (ref >60)
Glucose, Bld: 93 mg/dL (ref 70–99)
Potassium: 3 mmol/L — ABNORMAL LOW (ref 3.5–5.1)
Sodium: 146 mmol/L — ABNORMAL HIGH (ref 135–145)

## 2022-09-25 LAB — BILIRUBIN, TOTAL: Total Bilirubin: 9 mg/dL — ABNORMAL HIGH (ref 0.3–1.2)

## 2022-09-25 LAB — CBC
HCT: 34.6 % — ABNORMAL LOW (ref 36.0–46.0)
Hemoglobin: 9.6 g/dL — ABNORMAL LOW (ref 12.0–15.0)
MCH: 22.5 pg — ABNORMAL LOW (ref 26.0–34.0)
MCHC: 27.7 g/dL — ABNORMAL LOW (ref 30.0–36.0)
MCV: 81.2 fL (ref 80.0–100.0)
Platelets: 132 10*3/uL — ABNORMAL LOW (ref 150–400)
RBC: 4.26 MIL/uL (ref 3.87–5.11)
RDW: 23.3 % — ABNORMAL HIGH (ref 11.5–15.5)
WBC: 10.8 10*3/uL — ABNORMAL HIGH (ref 4.0–10.5)
nRBC: 0.6 % — ABNORMAL HIGH (ref 0.0–0.2)

## 2022-09-25 LAB — AMMONIA: Ammonia: 53 umol/L — ABNORMAL HIGH (ref 9–35)

## 2022-09-25 LAB — POTASSIUM: Potassium: 3.1 mmol/L — ABNORMAL LOW (ref 3.5–5.1)

## 2022-09-25 LAB — PROTIME-INR
INR: 3 — ABNORMAL HIGH (ref 0.8–1.2)
Prothrombin Time: 31 seconds — ABNORMAL HIGH (ref 11.4–15.2)

## 2022-09-25 LAB — HEPARIN LEVEL (UNFRACTIONATED): Heparin Unfractionated: >1.1 IU/mL — ABNORMAL HIGH (ref 0.30–0.70)

## 2022-09-25 LAB — MAGNESIUM: Magnesium: 1.9 mg/dL (ref 1.7–2.4)

## 2022-09-25 LAB — PHOSPHORUS: Phosphorus: 2.8 mg/dL (ref 2.5–4.6)

## 2022-09-25 LAB — APTT: aPTT: 40 seconds — ABNORMAL HIGH (ref 24–36)

## 2022-09-25 MED ORDER — HEPARIN (PORCINE) 25000 UT/250ML-% IV SOLN
1500.0000 [IU]/h | INTRAVENOUS | Status: DC
  Administered 2022-09-25 – 2022-09-27 (×3): 1200 [IU]/h via INTRAVENOUS
  Administered 2022-09-28: 1400 [IU]/h via INTRAVENOUS
  Filled 2022-09-25 (×3): qty 250

## 2022-09-25 MED ORDER — METOPROLOL TARTRATE 25 MG PO TABS
37.5000 mg | ORAL_TABLET | Freq: Three times a day (TID) | ORAL | Status: DC
  Administered 2022-09-25 – 2022-09-28 (×7): 37.5 mg via ORAL
  Filled 2022-09-25 (×9): qty 2

## 2022-09-25 MED ORDER — DM-GUAIFENESIN ER 30-600 MG PO TB12
1.0000 | ORAL_TABLET | Freq: Two times a day (BID) | ORAL | Status: DC
  Administered 2022-09-25 – 2022-10-01 (×13): 1 via ORAL
  Filled 2022-09-25 (×14): qty 1

## 2022-09-25 MED ORDER — HYDROCODONE BIT-HOMATROP MBR 5-1.5 MG/5ML PO SOLN
5.0000 mL | Freq: Four times a day (QID) | ORAL | Status: DC | PRN
  Administered 2022-10-01: 5 mL via ORAL
  Filled 2022-09-25 (×3): qty 5

## 2022-09-25 NOTE — Progress Notes (Signed)
Patient refused oral medications at this time.  IV antiemetic given to assist with nausea.  Patient asked for phenergan.  The NP explained to the patient the concern for its affects on her cognition.   Will continue to monitor and try again to give patient her medications at another time.

## 2022-09-25 NOTE — TOC Progression Note (Signed)
Transition of Care [facility]) - Progression Note    Patient Details  Name: Chelsea Ramsey MRN: [id number] Date of Birth: 05/14/1966  Transition of Care [facility]) CM/SW [place], LCSW Phone Number: 09/25/2022, 12:04 PM  Clinical Narrative:   Chelsea Ramsey is still able to accept patient for PT and can add OT, RN, aide. Called and updated mother. She is also agreeable to DME recommendations for hospital bed, wheelchair, and 3-in-1. Will order closer to discharge. Patient will likely come to her home at discharge. Mother confirmed patient is not legally married.  Expected Discharge Plan and Services                                                 Social Determinants of Health (SDOH) Interventions    Readmission Risk Interventions     No data to display

## 2022-09-25 NOTE — Progress Notes (Signed)
Occupational Therapy Treatment Patient Details Name: Chelsea Ramsey MRN: [id number] DOB: 1965/12/09 Today's Date: 09/25/2022   History of present illness Pt is a 56 yo female seen by PCP secondary to not feeling well with abd pain for about a month. MD assessment includes: Right sided abdominal pain, transaminitis, hyperbilirubinemia, AKI, and iron deficiency anemia.   OT comments  Chelsea Ramsey was seen for OT/PT co-treatment on this date. Upon arrival to room pt reclined in bed, agreeable to tx. Pt requires MAX A don B socks seated. MOD A x2 + RW for BSC t/f. MAX A pericare in standing. Pt making good progress toward goals, will continue to follow POC. Discharge recommendation remains appropriate.     Recommendations for follow up therapy are one component of a multi-disciplinary discharge planning process, led by the attending physician.  Recommendations may be updated based on patient status, additional functional criteria and insurance authorization.    Follow Up Recommendations  Skilled nursing-short term rehab (<3 hours/day)    Assistance Recommended at Discharge Frequent or constant Supervision/Assistance  Patient can return home with the following  A lot of help with walking and/or transfers;A lot of help with bathing/dressing/bathroom;Help with stairs or ramp for entrance;Assist for transportation;Direct supervision/assist for medications management;Direct supervision/assist for financial management;Assistance with cooking/housework   Equipment Recommendations  Hospital bed    Recommendations for Other Services      Precautions / Restrictions Precautions Precautions: Fall Restrictions Weight Bearing Restrictions: No       Mobility Bed Mobility Overal bed mobility: Needs Assistance Bed Mobility: Supine to Sit, Sit to Supine     Supine to sit: Max assist, HOB elevated Sit to supine: Max assist, +2 for physical assistance        Transfers Overall  transfer level: Needs assistance Equipment used: Rolling walker (2 wheels) Transfers: Bed to chair/wheelchair/BSC, Sit to/from Stand Sit to Stand: Mod assist, +2 physical assistance     Step pivot transfers: Min assist, +2 safety/equipment           Balance Overall balance assessment: Needs assistance Sitting-balance support: Bilateral upper extremity supported Sitting balance-Leahy Scale: Fair     Standing balance support: Bilateral upper extremity supported Standing balance-Leahy Scale: Fair                             ADL either performed or assessed with clinical judgement   ADL Overall ADL's : Needs assistance/impaired                                       General ADL Comments: MAX A don B socks seated. MOD A x2 + RW for BSC t/f. MAX A pericare in standing      Cognition Arousal/Alertness: Awake/alert Behavior During Therapy: Flat affect Overall Cognitive Status: No family/caregiver present to determine baseline cognitive functioning                                 General Comments: cues for safety with transfer              General Comments patient has significant coughing during the session    Pertinent Vitals/ Pain       Pain Assessment Pain Assessment: No/denies pain   Frequency  Min 2X/week  Progress Toward Goals  OT Goals(current goals can now be found in the care plan section)  Progress towards OT goals: Progressing toward goals  Acute Rehab OT Goals Patient Stated Goal: to go home OT Goal Formulation: With patient Time For Goal Achievement: 10/02/22 Potential to Achieve Goals: Fair ADL Goals Pt Will Perform Grooming: with supervision;standing Pt Will Transfer to Toilet: with supervision;ambulating Pt Will Perform Toileting - Clothing Manipulation and hygiene: with supervision;sit to/from stand Pt/caregiver will Perform Home Exercise Program: Increased strength;Both right and left upper  extremity;With written HEP provided;With Supervision;With theraband  Plan Discharge plan remains appropriate;Frequency remains appropriate    Co-evaluation    PT/OT/SLP Co-Evaluation/Treatment: Yes Reason for Co-Treatment: For patient/therapist safety;To address functional/ADL transfers PT goals addressed during session: Balance OT goals addressed during session: ADL's and self-care      AM-PAC OT "6 Clicks" Daily Activity     Outcome Measure   Help from another person eating meals?: None Help from another person taking care of personal grooming?: A Little Help from another person toileting, which includes using toliet, bedpan, or urinal?: A Lot Help from another person bathing (including washing, rinsing, drying)?: A Lot Help from another person to put on and taking off regular upper body clothing?: A Little Help from another person to put on and taking off regular lower body clothing?: A Lot 6 Click Score: 16    End of Session Equipment Utilized During Treatment: Oxygen  OT Visit Diagnosis: Unsteadiness on feet (R26.81);Repeated falls (R29.6);Muscle weakness (generalized) (M62.81)   Activity Tolerance Patient tolerated treatment well   Patient Left in bed;with call bell/phone within reach;with bed alarm set   Nurse Communication Mobility status        Time: 9767-3419 OT Time Calculation (min): 31 min  Charges: OT General Charges $OT Visit: 1 Visit OT Treatments $Self Care/Home Management : 8-22 mins  Dessie Coma, M.S. OTR/L  09/25/22, 1:45 PM  ascom [phone]

## 2022-09-25 NOTE — Consult Note (Signed)
NAME: Chelsea Ramsey  DOB: December 30, 1965  MRN: [id number]  Date/Time: 09/25/2022 2:28 PM  REQUESTING PROVIDER: [email or web address] Subjective:  REASON FOR CONSULT: positive quantiferon gold and hemoptysis ?pt is a limited historian because of her medical condition, mental status Chelsea Ramsey is a 56 y.o. with a history of PAF, chronic diastolic CHF. DM< COPD, CAD, mitral valve replacement , GERD< PUD  presented to the hopsital on 09/15/22 after being sent by her PCP for abdominal pain- RUQ/rt flank and rt side of abdomien pain for 1 months. She also had abnormal renal labs Vitals in the ED  09/15/22  BP 101/60  Temp 97.8 F (36.6 C)  Pulse Rate 103 !  Resp 17  SpO2 94 %  O2 Flow Rate (L/min) 3 L/min  Weight 264 lb 15.9 oz   Labs in the ED  Latest Reference Range & Units 09/15/22  WBC 4.0 - 10.5 K/uL 6.7  Hemoglobin 12.0 - 15.0 g/dL 9.9 (L)  HCT 36.0 - 46.0 % 35.6 (L)  Platelets 150 - 400 K/uL 269  Creatinine 0.44 - 1.00 mg/dL 3.54 (H)  US abdomen revealed acute cholecystitis, hepatic steatosis On admission she had a bilirubin of 9.6, AST mildly elevated> 50 Evaluated by surgery who did not think there was need for any intervention as MRCP did not show any CBD/GB issue GI was consulted for abnormal Bilirubin- They were concerned about hemolysis and heme was consulted Cariology saw patient-  NASH and congestive hepatopathy were condisered for abnormal lfts On 09/21/22 she had coughed up blood Apparently had epistaxis and xarelto was placed on hold NH3 was 135 on 10/23 - it was checked for AMS- ABG also showed increase in co2 at 59 Given lactulose and mentation improved Because of hemoptyis- quantiferon gold sent and was positive I am asked to see the patient for the same  Pt had for IDA in sept had capsule endoscopy which was N Past Medical History:  Diagnosis Date   Acute drug-induced gout of left foot 03/01/2018   Last Assessment & Plan:  Likely at least partially  brought on by diuresis.  Needs to continue to diurese  Will push hydration Stop allopurinol given initiation during acute flare may worsen this, re-broach this when asymptomatic Avoid nsaids given stomach pain Trial colchicine Add acetaminophen Ice, elevate, rest   Allergy    seasonal   Anxiety    Arthritis    Right Knee   Asthma    CHF (congestive heart failure) (HCC)    COPD (chronic obstructive pulmonary disease) (HCC)    Coronary artery disease    Leaky heart valve   Diabetes mellitus without complication (HCC)    Dysrhythmia    Fibromyalgia    GERD (gastroesophageal reflux disease)    Hypertension    Pneumonia    PUD (peptic ulcer disease)    Pulmonary HTN (HCC)    Rheumatic fever/heart disease    Sleep apnea     Past Surgical History:  Procedure Laterality Date   ADENOIDECTOMY     COLONOSCOPY WITH PROPOFOL N/A 11/06/2019   Procedure: COLONOSCOPY WITH PROPOFOL;  Surgeon: Virgel Manifold, MD;  Location: [facility] ENDOSCOPY;  Service: Endoscopy;  Laterality: N/A;  2 day prep    COLONOSCOPY WITH PROPOFOL N/A 06/29/2022   Procedure: COLONOSCOPY WITH PROPOFOL;  Surgeon: Lin Landsman, MD;  Location: [facility] ENDOSCOPY;  Service: Gastroenterology;  Laterality: N/A;   CORONARY ARTERY BYPASS GRAFT     June 27 2018   ESOPHAGOGASTRODUODENOSCOPY (EGD) WITH PROPOFOL  N/A 05/25/2018   Procedure: ESOPHAGOGASTRODUODENOSCOPY (EGD) WITH PROPOFOL;  Surgeon: Lucilla Lame, MD;  Location: [name] ENDOSCOPY;  Service: Endoscopy;  Laterality: N/A;   ESOPHAGOGASTRODUODENOSCOPY (EGD) WITH PROPOFOL N/A 11/06/2019   Procedure: ESOPHAGOGASTRODUODENOSCOPY (EGD) WITH PROPOFOL;  Surgeon: Virgel Manifold, MD;  Location: [facility] ENDOSCOPY;  Service: Endoscopy;  Laterality: N/A;   ESOPHAGOGASTRODUODENOSCOPY (EGD) WITH PROPOFOL N/A 06/29/2022   Procedure: ESOPHAGOGASTRODUODENOSCOPY (EGD) WITH PROPOFOL;  Surgeon: Lin Landsman, MD;  Location: [facility] ENDOSCOPY;  Service: Gastroenterology;  Laterality: N/A;    GIVENS CAPSULE STUDY N/A 07/22/2022   Procedure: GIVENS CAPSULE STUDY;  Surgeon: Lin Landsman, MD;  Location: [facility] ENDOSCOPY;  Service: Gastroenterology;  Laterality: N/A;   MITRAL VALVE REPLACEMENT     MULTIPLE TOOTH EXTRACTIONS     TONSILLECTOMY     TOTAL KNEE ARTHROPLASTY Right 09/04/2016   Procedure: TOTAL KNEE ARTHROPLASTY; with lateral release;  Surgeon: Earlie Server, MD;  Location: [place];  Service: Orthopedics;  Laterality: Right;    Social History   Socioeconomic History   Marital status: Married    Spouse name: Not on file   Number of children: Not on file   Years of education: Not on file   Highest education level: Not on file  Occupational History   Occupation: disabled  Tobacco Use   Smoking status: Every Day    Packs/day: 0.10    Types: Cigarettes   Smokeless tobacco: Never   Tobacco comments:    4/20 was not able to quit.  Still at 1-3 a day. She would like to wait to set a new quit date until we get back to class to have the extra in person support  Vaping Use   Vaping Use: Never used  Substance and Sexual Activity   Alcohol use: Never    Alcohol/week: 3.0 standard drinks of alcohol    Types: 3 Cans of beer per week    Comment: 16 oz per week   Drug use: Not Currently    Comment: Previous use of cocaine and marijuana last use 07/31/16    Sexual activity: Not Currently  Other Topics Concern   Not on file  Social History Narrative   Not on file   Social Determinants of Health   Financial Resource Strain: Low Risk  (10/02/2019)   Overall Financial Resource Strain (CARDIA)    Difficulty of Paying Living Expenses: Not hard at all  Food Insecurity: No Food Insecurity (09/18/2022)   Hunger Vital Sign    Worried About Running Out of Food in the Last Year: Never true    Ran Out of Food in the Last Year: Never true  Transportation Needs: No Transportation Needs (09/18/2022)   PRAPARE - Hydrologist (Medical): No    Lack of  Transportation (Non-Medical): No  Physical Activity: Insufficiently Active (10/02/2019)   Exercise Vital Sign    Days of Exercise per Week: 7 days    Minutes of Exercise per Session: 20 min  Stress: No Stress Concern Present (10/02/2019)   [place]    Feeling of Stress : Not at all  Social Connections: Moderately Integrated (10/02/2019)   Social Connection and Isolation Panel [NHANES]    Frequency of Communication with Friends and Family: More than three times a week    Frequency of Social Gatherings with Friends and Family: More than three times a week    Attends Religious Services: 1 to 4 times per year  Active Member of Clubs or Organizations: Yes    Attends Archivist Meetings: 1 to 4 times per year    Marital Status: Never married  Intimate Partner Violence: Not At Risk (09/18/2022)   Humiliation, Afraid, Rape, and Kick questionnaire    Fear of Current or Ex-Partner: No    Emotionally Abused: No    Physically Abused: No    Sexually Abused: No    Family History  Problem Relation Age of Onset   COPD Mother    Cancer Mother        Bone   Asthma Mother    Rheum arthritis Mother    Congestive Heart Failure Father    Breast cancer Maternal Aunt    Allergies  Allergen Reactions   Amiodarone Nausea And Vomiting   Aspirin Swelling   Flexeril [Cyclobenzaprine] Swelling   Trazamine [Trazodone & Diet Manage Prod] Nausea And Vomiting   Codeine Rash   Tramadol Rash   I? Current Facility-Administered Medications  Medication Dose Route Frequency Provider Last Rate Last Admin   (feeding supplement) PROSource Plus liquid 30 mL  30 mL Oral TID BM Amin, Soundra Pilon, MD   30 mL at 09/25/22 1222   0.9 %  sodium chloride infusion  250 mL Intravenous PRN Rai, Ripudeep K, MD       acetaminophen (TYLENOL) tablet 650 mg  650 mg Oral Q6H PRN Para Skeans, MD       Or   acetaminophen (TYLENOL) suppository 650 mg  650  mg Rectal Q6H PRN Para Skeans, MD       acidophilus (RISAQUAD) capsule 1 capsule  1 capsule Oral Daily Lorella Nimrod, MD   1 capsule at 09/25/22 0816   albuterol (PROVENTIL) (2.5 MG/3ML) 0.083% nebulizer solution 2.5 mg  2.5 mg Nebulization Q2H PRN Rai, Ripudeep K, MD       allopurinol (ZYLOPRIM) tablet 100 mg  100 mg Oral Daily Florina Ou V, MD   100 mg at 09/25/22 0815   dextromethorphan-guaiFENesin (Buchtel DM) 30-600 MG per 12 hr tablet 1 tablet  1 tablet Oral BID Val Riles, MD   1 tablet at 09/25/22 1157   docusate sodium (COLACE) capsule 100 mg  100 mg Oral BID Florina Ou V, MD   100 mg at 09/25/22 0816   feeding supplement (ENSURE ENLIVE / ENSURE PLUS) liquid 237 mL  237 mL Oral TID BM Lorella Nimrod, MD   237 mL at 09/25/22 1223   furosemide (LASIX) injection 40 mg  40 mg Intravenous Q12H Callwood, Dwayne D, MD   40 mg at 09/25/22 4270   gabapentin (NEURONTIN) tablet 300 mg  300 mg Oral BID Lorella Nimrod, MD   300 mg at 09/25/22 0816   HYDROcodone bit-homatropine (HYCODAN) 5-1.5 MG/5ML syrup 5 mL  5 mL Oral Q6H PRN Val Riles, MD       lactulose (CHRONULAC) 10 GM/15ML solution 30 g  30 g Oral BID Lorella Nimrod, MD   30 g at 09/25/22 0817   lactulose (CHRONULAC) enema 200 gm  300 mL Rectal BID Lorella Nimrod, MD   300 mL at 09/22/22 1107   metoprolol tartrate (LOPRESSOR) injection 2.5 mg  2.5 mg Intravenous Q6H PRN Foust, Katy L, NP   2.5 mg at 09/18/22 1125   metoprolol tartrate (LOPRESSOR) tablet 37.5 mg  37.5 mg Oral TID Tristan Schroeder, PA-C       midodrine (PROAMATINE) tablet 10 mg  10 mg Oral TID WC Rai, Vernelle Emerald, MD  10 mg at 09/25/22 1157   multivitamin with minerals tablet 1 tablet  1 tablet Oral Daily Lorella Nimrod, MD   1 tablet at 09/25/22 0816   nitroGLYCERIN (NITROSTAT) SL tablet 0.4 mg  0.4 mg Sublingual Q5 min PRN Para Skeans, MD       pantoprazole (PROTONIX) injection 40 mg  40 mg Intravenous Q12H Para Skeans, MD   40 mg at 09/25/22 0816   potassium  chloride (KLOR-CON) packet 40 mEq  40 mEq Oral BID Foust, Katy L, NP   40 mEq at 09/25/22 0816   prochlorperazine (COMPAZINE) injection 10 mg  10 mg Intravenous Q6H PRN Sharion Settler, NP   10 mg at 09/24/22 1416   rifaximin (XIFAXAN) tablet 550 mg  550 mg Oral BID Lesly Rubenstein, MD   550 mg at 09/25/22 0817   sodium chloride flush (NS) 0.9 % injection 3 mL  3 mL Intravenous Q12H Florina Ou V, MD   3 mL at 09/24/22 2300   sodium chloride flush (NS) 0.9 % injection 3 mL  3 mL Intravenous Q12H Rai, Ripudeep K, MD   3 mL at 09/25/22 0817   sodium chloride flush (NS) 0.9 % injection 3 mL  3 mL Intravenous PRN Rai, Ripudeep K, MD       spironolactone (ALDACTONE) tablet 12.5 mg  12.5 mg Oral Daily Tristan Schroeder, PA-C   12.5 mg at 09/25/22 0815   traZODone (DESYREL) tablet 25 mg  25 mg Oral QHS PRN Sharion Settler, NP   25 mg at 09/19/22 2342     Abtx:  Anti-infectives (From admission, onward)    Start     Dose/Rate Route Frequency Ordered Stop   09/22/22 2200  rifaximin (XIFAXAN) tablet 550 mg        550 mg Oral 2 times daily 09/22/22 1546         REVIEW OF SYSTEMS: \pt  in bed but lethargic- speaks softly Unable to get a good Review of system Objective:  VITALS:  BP (!) 117/93 (BP Location: Right Arm)   Pulse 92   Temp 98.1 F (36.7 C)   Resp 18   Ht '5\' 4"'$  (1.626 m)   Wt 106.8 kg   LMP 11/08/2009 Comment: menopause  SpO2 100%   BMI 40.42 kg/m   PHYSICAL EXAM:  General: lethargic , awake, some tachypnea, speaks very softly Oriented in place and person- responds to questions appropriately Head: Normocephalic, without obvious abnormality, atraumatic. Eyes: Conjunctivae clear, icteric sclerae. Pupils are equal ENT Nares normal. No drainage or sinus tenderness. Lips, mucosa, and tongue normal. No Thrush edentulous Neck: symmetrical, no adenopathy, thyroid: non tender no carotid bruit and no JVD.  Lungs: b/l air entry- crepts bases Heart: s1s2 Abdomen: Soft,  tender all over , more rt upper quadrant Extremities: edema ankles Skin: No rashes or lesions. Or bruising Lymph: Cervical, supraclavicular normal. Neurologic: Grossly non-focal Pertinent Labs Lab Results CBC    Component Value Date/Time   WBC 10.8 (H) 09/25/2022 0449   RBC 4.26 09/25/2022 0449   HGB 9.6 (L) 09/25/2022 0449   HGB 13.3 02/20/2015 1015   HCT 34.6 (L) 09/25/2022 0449   HCT 41.4 02/20/2015 1015   PLT 132 (L) 09/25/2022 0449   PLT 182 02/20/2015 1015   MCV 81.2 09/25/2022 0449   MCV 90 02/20/2015 1015   MCH 22.5 (L) 09/25/2022 0449   MCHC 27.7 (L) 09/25/2022 0449   RDW 23.3 (H) 09/25/2022 0449   RDW 13.7 02/20/2015  1015   LYMPHSABS 1.1 09/17/2022 0434   LYMPHSABS 1.4 02/20/2015 1015   MONOABS 0.4 09/17/2022 0434   MONOABS 0.7 02/20/2015 1015   EOSABS 0.1 09/17/2022 0434   EOSABS 0.1 02/20/2015 1015   BASOSABS 0.0 09/17/2022 0434   BASOSABS 0.0 02/20/2015 1015       Latest Ref Rng & Units 09/25/2022    4:49 AM 09/24/2022    2:35 PM 09/24/2022    4:27 AM  CMP  Glucose 70 - 99 mg/dL 93   90   BUN 6 - 20 mg/dL 32   33   Creatinine 0.44 - 1.00 mg/dL 1.10   1.27   Sodium 135 - 145 mmol/L 146   147   Potassium 3.5 - 5.1 mmol/L 3.0  3.5  3.1   Chloride 98 - 111 mmol/L 97   97   CO2 22 - 32 mmol/L 38   37   Calcium 8.9 - 10.3 mg/dL 9.1   9.0   Total Protein 6.5 - 8.1 g/dL   6.6   Total Bilirubin 0.3 - 1.2 mg/dL 9.0   9.8   Alkaline Phos 38 - 126 U/L   76   AST 15 - 41 U/L   47   ALT 0 - 44 U/L   27    BNP 1803    Microbiology: No results found for this or any previous visit (from the past 240 hour(s)).  ECHO Severe PHTN Left atrium severely dilated Rt atrium severely dilated EF 35-40%  IMAGING RESULTS: Cardiomegaly with pulmonary edema  I have personally reviewed the films ? Impression/Recommendation  Encephalopathy- hepatic VS co2 narcossis- may need BIPAP  Hemoptysis could be from pulmonary HTN, Pumonary edema also was on xarelto which  has been stopped Because quantiferon gold is positive check sputum for afb X 3 every 8 hrs Less likely to be TB No treatment needed currently ? CHF/ cardiomeglay Pulmonary hypertension Rt heart failure  Hepatic congestion Fatty liver Hyperbilirubinemia Hyper ammonemia  ?? Decompensated liver  Anemia  AKI- improved  Borderline low platelet PAF was on xarelto stopped due to hemoptysis and epistaxis   ?H/o mitral valve repair ___________________________________________________ Discussed with requesting provider and nurse ID will follow her peripherally this weekend - call if needed Note:  This document was prepared using Dragon voice recognition software and may include unintentional dictation errors.

## 2022-09-25 NOTE — Progress Notes (Signed)
Physical Therapy Treatment Patient Details Name: Chelsea Ramsey MRN: [id number] DOB: [birth year] Today's Date: 09/25/2022   History of Present Illness Pt is a 56 yo female seen by PCP secondary to not feeling well with abd pain for about a month. MD assessment includes: Right sided abdominal pain, transaminitis, hyperbilirubinemia, AKI, and iron deficiency anemia.    PT Comments    Patient is more alert today and able to follow commands more consistently than yesterday. Significant coughing is noted today. The patient required intermittent +2 person assistance with functional mobility. She was able to get to the bed side commode with stand step transfer using rolling walker with cues for technique. Increased time and effort required with all mobility. SNF is still the most appropriate discharge plan. If family wants to bring the patient home, hospital bed and wheelchair recommended.    Recommendations for follow up therapy are one component of a multi-disciplinary discharge planning process, led by the attending physician.  Recommendations may be updated based on patient status, additional functional criteria and insurance authorization.  Follow Up Recommendations  Skilled nursing-short term rehab (<3 hours/day) Can patient physically be transported by private vehicle: No   Assistance Recommended at Discharge Frequent or constant Supervision/Assistance  Patient can return home with the following Two people to help with walking and/or transfers;Two people to help with bathing/dressing/bathroom;Assistance with cooking/housework;Help with stairs or ramp for entrance;Assist for transportation   Equipment Recommendations       Recommendations for Other Services       Precautions / Restrictions Precautions Precautions: Fall Restrictions Weight Bearing Restrictions: No     Mobility  Bed Mobility Overal bed mobility: Needs Assistance Bed Mobility: Supine to Sit, Sit to  Supine     Supine to sit: Max assist, HOB elevated Sit to supine: Max assist, +2 for physical assistance   General bed mobility comments: verbal cues for technique. assistance for trunk and BLE support. increased time and effort required with all functional mobility tasks    Transfers Overall transfer level: Needs assistance Equipment used: Rolling walker (2 wheels) Transfers: Bed to chair/wheelchair/BSC, Sit to/from Stand Sit to Stand: Mod assist, +2 physical assistance   Step pivot transfers: Min assist       General transfer comment: patient required Mod A for stand from the bed and Mod +2 for standing from the bed side commode. Min A for stepping several steps to and from the bed side commode    Ambulation/Gait               General Gait Details: not attempted more than taking a few steps to and from the toilet. activity tolerance limited by fatigue   Stairs             Wheelchair Mobility    Modified Rankin (Stroke Patients Only)       Balance Overall balance assessment: Needs assistance Sitting-balance support: Bilateral upper extremity supported Sitting balance-Leahy Scale: Fair Sitting balance - Comments: once feet on the floor, patient required only Min guard assistance   Standing balance support: Bilateral upper extremity supported Standing balance-Leahy Scale: Fair Standing balance comment: Min guard for safety                            Cognition Arousal/Alertness: Awake/alert Behavior During Therapy: Flat affect Overall Cognitive Status: No family/caregiver present to determine baseline cognitive functioning  General Comments: patient is able to follow single step commands with increased time. increased time for processing        Exercises      General Comments General comments (skin integrity, edema, etc.): patient has significant coughing during the session       Pertinent Vitals/Pain Pain Assessment Pain Assessment: No/denies pain    Home Living                          Prior Function            PT Goals (current goals can now be found in the care plan section) Acute Rehab PT Goals Patient Stated Goal: to go home PT Goal Formulation: With patient Time For Goal Achievement: 09/30/22 Potential to Achieve Goals: Fair Progress towards PT goals: Progressing toward goals    Frequency    Min 2X/week      PT Plan Current plan remains appropriate    Co-evaluation PT/OT/SLP Co-Evaluation/Treatment: Yes Reason for Co-Treatment: Complexity of the patient's impairments (multi-system involvement) PT goals addressed during session: Mobility/safety with mobility        AM-PAC PT "6 Clicks" Mobility   Outcome Measure  Help needed turning from your back to your side while in a flat bed without using bedrails?: A Lot Help needed moving from lying on your back to sitting on the side of a flat bed without using bedrails?: Total Help needed moving to and from a bed to a chair (including a wheelchair)?: Total Help needed standing up from a chair using your arms (e.g., wheelchair or bedside chair)?: Total Help needed to walk in hospital room?: Total Help needed climbing 3-5 steps with a railing? : Total 6 Click Score: 7    End of Session Equipment Utilized During Treatment: Oxygen Activity Tolerance: Patient limited by fatigue Patient left: in bed;with call bell/phone within reach;with bed alarm set Nurse Communication: Mobility status PT Visit Diagnosis: Muscle weakness (generalized) (M62.81);Difficulty in walking, not elsewhere classified (R26.2);Unsteadiness on feet (R26.81)     Time: 5188-4166 PT Time Calculation (min) (ACUTE ONLY): 32 min  Charges:  $Therapeutic Activity: 8-22 mins                    Minna Merritts, PT, MPT    Percell Locus 09/25/2022, 11:37 AM

## 2022-09-25 NOTE — Progress Notes (Signed)
[facility] NOTE       Patient ID: Ranae Casebier MRN: [id number] DOB/AGE: [birth year] 55 y.o.  Admit date: 09/15/2022 Referring Physician Dr. Estill Cotta Primary Physician Dr. Gracy Racer Primary Cardiologist Dr. Saralyn Pilar Reason for Consultation acute on chronic HFrEF, paroxysmal atrial fibrillation with RVR  HPI: Geraline C. 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  Labs on admission showed acute renal failure (improved with diuresis), transaminitis, and hyperbilirubinemia, ultimately attributed to volume overload from her heart failure exacerbation.  Initial echo on 10/19 revealed a newly reduced LVEF with moderately reduced RV function, and severe TR. Hospital course complicated by atrial fibrillation with RVR and persistently elevated T. bili.  Ongoing primary complaint is generalized abdominal pain. Repeat limited echo on 10/25 after aggressive diuresis showed persistent dilation of hepatic vein, moderate RV enlargement, severely elevated PASP, and severe TR, c/w severe pulmonary hypertension.  Interval history: -More alert and interactive today, says she feels "so-so" but overall better than yesterday.  Abdominal pain has somewhat improved.  Discussed her great response to diuresis, echo results, and persistently abnormal liver tests -Main complaint is a significant cough, had some bloody sputum last night and again this morning.  I restarted Xarelto on 10/26, but will hold today's dose of this. -I reviewed her respiratory tests to  see if any had resulted from admission, noticed that her QuantiFERON gold was positive  -Repeat chest x-ray shows interval improvement in patchy infiltrates, radiology read as this likely pulmonary edema -She was tachycardic this morning, in the 120s-130s, was unable to get her morning dose of p.o. metoprolol due to significant coughing   Past Medical History:  Diagnosis Date   Acute drug-induced gout of left foot 03/01/2018   Last Assessment & Plan:  Likely at least partially brought on by diuresis.  Needs to continue to diurese  Will push hydration Stop allopurinol given initiation during acute flare may worsen this, re-broach this when asymptomatic Avoid nsaids given stomach pain Trial colchicine Add acetaminophen Ice, elevate, rest   Allergy    seasonal   Anxiety    Arthritis    Right Knee   Asthma    CHF (congestive heart failure) (HCC)    COPD (chronic obstructive pulmonary disease) (HCC)    Coronary artery disease    Leaky heart valve   Diabetes mellitus without complication (HCC)    Dysrhythmia    Fibromyalgia    GERD (gastroesophageal reflux disease)    Hypertension    Pneumonia    PUD (peptic ulcer disease)    Pulmonary HTN (HCC)    Rheumatic fever/heart disease    Sleep apnea     Past Surgical History:  Procedure Laterality Date   ADENOIDECTOMY     COLONOSCOPY WITH PROPOFOL N/A 11/06/2019   Procedure: COLONOSCOPY WITH PROPOFOL;  Surgeon: Virgel Manifold, MD;  Location: [facility] ENDOSCOPY;  Service: Endoscopy;  Laterality: N/A;  2 day prep    COLONOSCOPY WITH PROPOFOL N/A 06/29/2022   Procedure: COLONOSCOPY WITH PROPOFOL;  Surgeon: Lin Landsman, MD;  Location: [place];  Service: Gastroenterology;  Laterality: N/A;   CORONARY ARTERY BYPASS GRAFT     June 27 2018   ESOPHAGOGASTRODUODENOSCOPY (EGD) WITH PROPOFOL N/A 05/25/2018   Procedure: ESOPHAGOGASTRODUODENOSCOPY (EGD) WITH PROPOFOL;  Surgeon: Lucilla Lame, MD;  Location: [facility] ENDOSCOPY;  Service:  Endoscopy;  Laterality: N/A;   ESOPHAGOGASTRODUODENOSCOPY (EGD) WITH PROPOFOL N/A 11/06/2019   Procedure: ESOPHAGOGASTRODUODENOSCOPY (EGD) WITH PROPOFOL;  Surgeon: Virgel Manifold, MD;  Location: [facility] ENDOSCOPY;  Service: Endoscopy;  Laterality: N/A;   ESOPHAGOGASTRODUODENOSCOPY (EGD) WITH PROPOFOL N/A 06/29/2022   Procedure: ESOPHAGOGASTRODUODENOSCOPY (EGD) WITH PROPOFOL;  Surgeon: Lin Landsman, MD;  Location: [facility] ENDOSCOPY;  Service: Gastroenterology;  Laterality: N/A;   GIVENS CAPSULE STUDY N/A 07/22/2022   Procedure: GIVENS CAPSULE STUDY;  Surgeon: Lin Landsman, MD;  Location: [facility] ENDOSCOPY;  Service: Gastroenterology;  Laterality: N/A;   MITRAL VALVE REPLACEMENT     MULTIPLE TOOTH EXTRACTIONS     TONSILLECTOMY     TOTAL KNEE ARTHROPLASTY Right 09/04/2016   Procedure: TOTAL KNEE ARTHROPLASTY; with lateral release;  Surgeon: Earlie Server, MD;  Location: [place];  Service: Orthopedics;  Laterality: Right;    Medications Prior to Admission  Medication Sig Dispense Refill Last Dose   albuterol (VENTOLIN HFA) 108 (90 Base) MCG/ACT inhaler Inhale 1-2 puffs into the lungs every 6 (six) hours as needed for wheezing or shortness of breath.   Unknown at PRN   allopurinol (ZYLOPRIM) 100 MG tablet Take 1 tablet (100 mg total) by mouth 2 (two) times daily. 60 tablet 5 Past Week at Unknown   Cholecalciferol (VITAMIN D) 50 MCG (2000 UT) CAPS Take 1 capsule (2,000 Units total) by mouth daily. 30 capsule 5    colchicine 0.6 MG tablet Take 1 tablet (0.6 mg total) by mouth daily. As directed 30 tablet 5 Unknown at PRN   docusate sodium (COLACE) 100 MG capsule Take 100 mg by mouth 2 (two) times daily as needed for mild constipation or moderate constipation.   Unknown at PRN   empagliflozin (JARDIANCE) 10 MG TABS tablet Take 10 mg by mouth daily.   Past Week at Unknown   furosemide (LASIX) 40 MG tablet TAKE 2 TABLETS BY MOUTH IN THE MORNING AND 1 TABLET AT NIGHT (Patient taking differently: 40  mg 2 (two) times daily.) 90 tablet 5 Unknown at Unknown   gabapentin (NEURONTIN) 600 MG tablet Take 0.5 tablets (300 mg total) by mouth 2 (two) times daily. (Patient taking differently: Take 600 mg by mouth 3 (three) times daily.) 60 tablet 0 Past Week at Unknown   ipratropium-albuterol (DUONEB) 0.5-2.5 (3) MG/3ML SOLN INHALE THE CONTENTS OF 1 VIAL(3MLS) VIA NEBULIZER 3 TIMES DAILY AS NEEDED 360 mL 1    metolazone (ZAROXOLYN) 2.5 MG tablet Take 5 mg by mouth 3 (three) times a week.   Past Week at Unknown   metoprolol tartrate (LOPRESSOR) 100 MG tablet Take 1 tablet (100 mg total) by mouth 2 (two) times daily. 60 tablet 3 Past Week at Unknown   nitroGLYCERIN (NITROSTAT) 0.4 MG SL tablet Place 1 tablet (0.4 mg total) under the tongue every 5 (five) minutes as needed for chest pain. 30 tablet 12 Unknown at PRN   omeprazole (PRILOSEC) 40 MG capsule Take 40 mg by mouth daily.   Past Week at Unknown   oxyCODONE ER 9 MG C12A Take 9 mg by mouth 2 (two) times daily.   Past Week at Unknown   potassium chloride (KLOR-CON) 10 MEQ tablet Take 4 tablets (40 mEq total) by mouth 2 (two) times  daily. And additional 25mq on metolazone days (Patient taking differently: Take 40 mEq by mouth See admin instructions. Take 4 tablets (416m) by mouth twice daily - take an additional 2 tablets (2051m by mouth when taking metolazone) 64 tablet 5 Past Week at Unknown   promethazine (PHENERGAN) 12.5 MG tablet Take 1 tablet (12.5 mg total) by mouth every 8 (eight) hours as needed for nausea or vomiting. (Patient taking differently: Take 12.5 mg by mouth 2 (two) times daily as needed for nausea or vomiting.) 30 tablet 0 Unknown at PRN   rivaroxaban (XARELTO) 20 MG TABS tablet Take 20 mg by mouth daily with supper.   Past Week at Unknown   rosuvastatin (CRESTOR) 10 MG tablet Take 10 mg by mouth daily.   Past Week at Unknown   Iron-FA-B Cmp-C-Biot-Probiotic (FUSION PLUS) CAPS Take 1 capsule by mouth daily. (Patient not taking:  Reported on 09/16/2022) 30 capsule 3 Not Taking   Social History   Socioeconomic History   Marital status: Married    Spouse name: Not on file   Number of children: Not on file   Years of education: Not on file   Highest education level: Not on file  Occupational History   Occupation: disabled  Tobacco Use   Smoking status: Every Day    Packs/day: 0.10    Types: Cigarettes   Smokeless tobacco: Never   Tobacco comments:    4/20 was not able to quit.  Still at 1-3 a day. She would like to wait to set a new quit date until we get back to class to have the extra in person support  Vaping Use   Vaping Use: Never used  Substance and Sexual Activity   Alcohol use: Never    Alcohol/week: 3.0 standard drinks of alcohol    Types: 3 Cans of beer per week    Comment: 16 oz per week   Drug use: Not Currently    Comment: Previous use of cocaine and marijuana last use 07/31/16    Sexual activity: Not Currently  Other Topics Concern   Not on file  Social History Narrative   Not on file   Social Determinants of Health   Financial Resource Strain: Low Risk  (10/02/2019)   Overall Financial Resource Strain (CARDIA)    Difficulty of Paying Living Expenses: Not hard at all  Food Insecurity: No Food Insecurity (09/18/2022)   Hunger Vital Sign    Worried About Running Out of Food in the Last Year: Never true    Ran Out of Food in the Last Year: Never true  Transportation Needs: No Transportation Needs (09/18/2022)   PRAPARE - TraHydrologistedical): No    Lack of Transportation (Non-Medical): No  Physical Activity: Insufficiently Active (10/02/2019)   Exercise Vital Sign    Days of Exercise per Week: 7 days    Minutes of Exercise per Session: 20 min  Stress: No Stress Concern Present (10/02/2019)   [place] Feeling of Stress : Not at all  Social Connections: Moderately Integrated (10/02/2019)    Social Connection and Isolation Panel [NHANES]    Frequency of Communication with Friends and Family: More than three times a week    Frequency of Social Gatherings with Friends and Family: More than three times a week    Attends Religious Services: 1 to 4 times per year    Active Member of [employer] Organizations: Yes  Attends Club or Organization Meetings: 1 to 4 times per year    Marital Status: Never married  Intimate Partner Violence: Not At Risk (09/18/2022)   Humiliation, Afraid, Rape, and Kick questionnaire    Fear of Current or Ex-Partner: No    Emotionally Abused: No    Physically Abused: No    Sexually Abused: No    Family History  Problem Relation Age of Onset   COPD Mother    Cancer Mother        Bone   Asthma Mother    Rheum arthritis Mother    Congestive Heart Failure Father    Breast cancer Maternal Aunt      Vitals:   09/25/22 0100 09/25/22 0505 09/25/22 0756 09/25/22 1138  BP: 98/62 103/75 107/76 (!) 117/93  Pulse: 99 98 95 92  Resp: '18 16 18 18  '$ Temp: 98.6 F (37 C) 98 F (36.7 C) 98.4 F (36.9 C) 98.1 F (36.7 C)  TempSrc: Oral Axillary Axillary   SpO2: 100% 100% 100% 100%  Weight:  106.8 kg    Height:        PHYSICAL EXAM General: Chronically ill-appearing black female, persistent coughing throughout interview. HEENT:  Normocephalic and atraumatic. Neck:  + JVD.  Lungs: Significant coughing throughout interview, sometimes with productive sputum.  Decreased breath sounds bilaterally without appreciable crackles or wheezes.   Heart: Tachycardic irregularly irregular Grade 3/6 systolic rumbling murmur and soft S2. abdomen: Non-distended appearing with a large pannus.   Msk: generalized weakness, appears very deconditioned Extremities: No clubbing, cyanosis.  Trace bilateral lower extremity edema, warm to touch, continuing to improve Neuro: More alert and oriented today, asking appropriate questions regarding how she is doing clinically Psych:  Appropriate for situation.  Labs: Basic Metabolic Panel: Recent Labs    09/24/22 0427 09/24/22 1435 09/25/22 0449  NA 147*  --  146*  K 3.1* 3.5 3.0*  CL 97*  --  97*  CO2 37*  --  38*  GLUCOSE 90  --  93  BUN 33*  --  32*  CREATININE 1.27*  --  1.10*  CALCIUM 9.0  --  9.1  MG 2.4  --  1.9  PHOS  --   --  2.8    Liver Function Tests: Recent Labs    09/23/22 0546 09/24/22 0427 09/25/22 0449  AST 54* 47*  --   ALT 29 27  --   ALKPHOS 69 76  --   BILITOT 9.8* 9.8* 9.0*  PROT 6.6 6.6  --   ALBUMIN 2.5* 2.5*  --     No results for input(s): "LIPASE", "AMYLASE" in the last 72 hours. CBC: Recent Labs    09/24/22 0427 09/25/22 0449  WBC 13.7* 10.8*  HGB 9.7* 9.6*  HCT 35.2* 34.6*  MCV 81.1 81.2  PLT 127* 132*    Cardiac Enzymes: No results for input(s): "CKTOTAL", "CKMB", "CKMBINDEX", "TROPONINIHS" in the last 72 hours. BNP: Recent Labs    09/23/22 0546  BNP 1,803.8*    D-Dimer: No results for input(s): "DDIMER" in the last 72 hours. Hemoglobin A1C: No results for input(s): "HGBA1C" in the last 72 hours. Fasting Lipid Panel: No results for input(s): "CHOL", "HDL", "LDLCALC", "TRIG", "CHOLHDL", "LDLDIRECT" in the last 72 hours. Thyroid Function Tests: No results for input(s): "TSH", "T4TOTAL", "T3FREE", "THYROIDAB" in the last 72 hours.  Invalid input(s): "FREET3" Anemia Panel: No results for input(s): "VITAMINB12", "FOLATE", "FERRITIN", "TIBC", "IRON", "RETICCTPCT" in the last 72 hours.   Radiology: DG  Chest Port 1 View  Result Date: 09/25/2022 CLINICAL DATA:  Shortness of breath, renal failure. EXAM: PORTABLE CHEST 1 VIEW COMPARISON:  Chest radiograph dated 09/16/2022. FINDINGS: The heart is enlarged. Mild-to-moderate bilateral interstitial and airspace opacities are redemonstrated. There is no pleural effusion or pneumothorax. Degenerative changes are seen in the spine. A mitral valve prosthesis and atrial appendage clip, and median sternotomy wires  are redemonstrated. IMPRESSION: Cardiomegaly with mild-to-moderate bilateral interstitial and airspace opacities, likely pulmonary edema. Electronically Signed   By: Zerita Boers M.D.   On: 09/25/2022 10:23   ECHOCARDIOGRAM LIMITED  Result Date: 09/23/2022    ECHOCARDIOGRAM LIMITED REPORT   Patient Name:   ROBERTTA HALFHILL Date of Exam: 09/23/2022 Medical Rec #:  [id number]                 Height:       64.0 in Accession #:    [id number]                Weight:       243.4 lb Date of Birth:  1966-01-18                 BSA:          2.127 m Patient Age:    56 years                  BP:           109/71 mmHg Patient Gender: F                         HR:           96 bpm. Exam Location:  [facility] Procedure: Limited Echo Indications:     CHF I50.9  History:         Patient has prior history of Echocardiogram examinations, most                  recent 09/17/2022. CHF, Prior Cardiac Surgery, COPD; Risk                  Factors:Hypertension.                   Mitral Valve: bioprosthetic valve valve is present in the                  mitral position.  Sonographer:     Sherrie Sport Referring Phys:  [id number] [place] Kaly Mcquary Diagnosing Phys: Serafina Royals MD  Sonographer Comments: Technically difficult study due to poor echo windows, suboptimal apical window and suboptimal parasternal window. IMPRESSIONS  1. Left ventricular ejection fraction, by estimation, is 35 to 40%. The left ventricle has moderately decreased function. The left ventricle demonstrates global hypokinesis. The left ventricular internal cavity size was mildly dilated.  2. There is LV septal flattenting in systole and diastole and dilated hepatic vein consistant with severe phtn. Right ventricular systolic function is moderately reduced. The right ventricular size is moderately enlarged. Mildly increased right ventricular wall thickness. There is severely elevated pulmonary artery systolic pressure.  3. Left atrial size was severely dilated.  4.  Right atrial size was severely dilated.  5. Cannot rule out mild stenosis. doppler difficult to interpret. The mitral valve is degenerative. Mild mitral valve regurgitation. 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  6. The tricuspid valve is abnormal. Tricuspid valve regurgitation is severe.  7. The aortic valve is calcified. Aortic  valve regurgitation is moderate. FINDINGS  Left Ventricle: Left ventricular ejection fraction, by estimation, is 35 to 40%. The left ventricle has moderately decreased function. The left ventricle demonstrates global hypokinesis. The left ventricular internal cavity size was mildly dilated. Right Ventricle: There is LV septal flattenting in systole and diastole and dilated hepatic vein consistant with severe phtn. The right ventricular size is moderately enlarged. Mildly increased right ventricular wall thickness. Right ventricular systolic  function is moderately reduced. There is severely elevated pulmonary artery systolic pressure. Left Atrium: Left atrial size was severely dilated. Right Atrium: Right atrial size was severely dilated. Pericardium: There is no evidence of pericardial effusion. Mitral Valve: Cannot rule out mild stenosis. doppler difficult to interpret. The mitral valve is degenerative in appearance. Mild mitral valve regurgitation. 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 MV peak gradient, 19.2 mmHg. The mean mitral valve gradient is 11.0 mmHg. Tricuspid Valve: The tricuspid valve is abnormal. Tricuspid valve regurgitation is severe. Aortic Valve: The aortic valve is calcified. Aortic valve regurgitation is moderate. Pulmonic Valve: The pulmonic valve was normal in structure. Pulmonic valve regurgitation is mild. Aorta: The aortic root and ascending aorta are structurally normal, with no evidence of dilitation. IAS/Shunts: No atrial level shunt detected by color flow Doppler. LEFT VENTRICLE PLAX 2D LVIDd:         4.70 cm  LVIDs:         3.10 cm LV PW:         0.90 cm LV IVS:        0.90 cm  RIGHT VENTRICLE RV Basal diam:  5.00 cm RV Mid diam:    6.50 cm LEFT ATRIUM         Index LA diam:    6.50 cm 3.06 cm/m   AORTA Ao Root diam: 2.90 cm MITRAL VALVE                TRICUSPID VALVE MV Area (PHT): 2.63 cm     TR Peak grad:   53.0 mmHg MV Peak grad:  19.2 mmHg    TR Vmax:        364.00 cm/s MV Mean grad:  11.0 mmHg MV Vmax:       2.19 m/s MV Vmean:      161.0 cm/s MV Decel Time: 288 msec MV E velocity: 239.00 cm/s Serafina Royals MD Electronically signed by Serafina Royals MD Signature Date/Time: 09/23/2022/4:02:55 PM    Final    ECHOCARDIOGRAM COMPLETE  Result Date: 09/17/2022    ECHOCARDIOGRAM REPORT   Patient Name:   LAURETTA SALLAS Date of Exam: 09/17/2022 Medical Rec #:  [id number]                 Height:       64.0 in Accession #:    [id number]                Weight:       265.0 lb Date of Birth:  09/21/1966                 BSA:          2.205 m Patient Age:    3 years                  BP:           93/79 mmHg Patient Gender: F  HR:           110 bpm. Exam Location:  [facility] Procedure: 2D Echo, Color Doppler and Cardiac Doppler Indications:     I50.31 CHF-Acute Diastolic  History:         Patient has prior history of Echocardiogram examinations, most                  recent 11/05/2018. CHF, CAD, COPD; Risk Factors:Hypertension,                  Diabetes and Sleep Apnea.  Sonographer:     Charmayne Sheer Referring Phys:  [id number] [place] Larken Urias Diagnosing Phys: Yolonda Kida MD  Sonographer Comments: Suboptimal apical window and no subcostal window. Image acquisition challenging due to patient body habitus and Image acquisition challenging due to COPD. IMPRESSIONS  1. Left ventricular ejection fraction, by estimation, is 20 to 25%. The left ventricle has severely decreased function. The left ventricle demonstrates global hypokinesis. Left ventricular diastolic parameters are consistent with Grade  III diastolic dysfunction (restrictive). There is the interventricular septum is flattened in systole and diastole, consistent with right ventricular pressure and volume overload.  2. Right ventricular systolic function is moderately reduced. The right ventricular size is moderately enlarged. Mildly increased right ventricular wall thickness.  3. Left atrial size was moderately dilated.  4. Right atrial size was severely dilated.  5. The mitral valve is abnormal. Mild to moderate mitral valve regurgitation.  6. Tricuspid valve regurgitation is severe.  7. The aortic valve is calcified. Aortic valve regurgitation is mild to moderate. Aortic valve sclerosis/calcification is present, without any evidence of aortic stenosis. Conclusion(s)/Recommendation(s): Poor windows for evaluation of left ventricular function by transthoracic echocardiography. Would recommend an alternative means of evaluation. FINDINGS  Left Ventricle: Left ventricular ejection fraction, by estimation, is 20 to 25%. The left ventricle has severely decreased function. The left ventricle demonstrates global hypokinesis. The left ventricular internal cavity size was normal in size. There is borderline concentric left ventricular hypertrophy. The interventricular septum is flattened in systole and diastole, consistent with right ventricular pressure and volume overload. Left ventricular diastolic parameters are consistent with Grade III diastolic dysfunction (restrictive). Right Ventricle: The right ventricular size is moderately enlarged. Mildly increased right ventricular wall thickness. Right ventricular systolic function is moderately reduced. Left Atrium: Left atrial size was moderately dilated. Right Atrium: Right atrial size was severely dilated. Pericardium: There is no evidence of pericardial effusion. Mitral Valve: The mitral valve is abnormal. Mild to moderate mitral valve regurgitation. Tricuspid Valve: The tricuspid valve is grossly  normal. Tricuspid valve regurgitation is severe. Aortic Valve: The aortic valve is calcified. Aortic valve regurgitation is mild to moderate. Aortic regurgitation PHT measures 233 msec. Aortic valve sclerosis/calcification is present, without any evidence of aortic stenosis. Aortic valve mean gradient measures 6.0 mmHg. Aortic valve peak gradient measures 11.3 mmHg. Aortic valve area, by VTI measures 1.14 cm. Pulmonic Valve: The pulmonic valve was normal in structure. Pulmonic valve regurgitation is mild to moderate. Aorta: The ascending aorta was not well visualized. IAS/Shunts: No atrial level shunt detected by color flow Doppler.  LEFT VENTRICLE PLAX 2D LVIDd:         3.50 cm LVIDs:         3.20 cm LV PW:         1.20 cm LV IVS:        0.90 cm LVOT diam:     1.80 cm LV SV:  25 LV SV Index:   12 LVOT Area:     2.54 cm  RIGHT VENTRICLE RV Basal diam:  5.30 cm RV Mid diam:    5.00 cm RV S prime:     7.62 cm/s LEFT ATRIUM         Index       RIGHT ATRIUM           Index LA diam:    5.50 cm 2.49 cm/m  RA Area:     28.50 cm                                 RA Volume:   104.00 ml 47.17 ml/m  AORTIC VALVE                     PULMONIC VALVE AV Area (Vmax):    0.87 cm      PV Vmax:          0.98 m/s AV Area (Vmean):   0.90 cm      PV Vmean:         69.200 cm/s AV Area (VTI):     1.14 cm      PV VTI:           0.128 m AV Vmax:           168.00 cm/s   PV Peak grad:     3.9 mmHg AV Vmean:          116.000 cm/s  PV Mean grad:     2.5 mmHg AV VTI:            0.223 m       PR End Diast Vel: 10.24 msec AV Peak Grad:      11.3 mmHg AV Mean Grad:      6.0 mmHg LVOT Vmax:         57.60 cm/s LVOT Vmean:        40.900 cm/s LVOT VTI:          0.100 m LVOT/AV VTI ratio: 0.45 AI PHT:            233 msec  AORTA Ao Root diam: 3.10 cm MITRAL VALVE               TRICUSPID VALVE MV Area (PHT): 5.95 cm    TR Peak grad:   55.7 mmHg MV Decel Time: 128 msec    TR Vmax:        373.00 cm/s MV E velocity: 87.60 cm/s                             SHUNTS                            Systemic VTI:  0.10 m                            Systemic Diam: 1.80 cm Yolonda Kida MD Electronically signed by Yolonda Kida MD Signature Date/Time: 09/17/2022/4:54:34 PM    Final    DG Chest 1 View  Result Date: 09/16/2022 CLINICAL DATA:  Worsening shortness of breath. 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  Cardiomegaly which has increased from 2021. Slight globular configuration of the heart, query pericardial effusion. Suspect small right pleural effusion. Chronic bilateral lung opacities that appears stable. No acute airspace disease or pneumothorax. On limited assessment, no acute osseous findings. IMPRESSION: 1. Increased cardiomegaly from 2021. Slight globular configuration of the heart, query pericardial effusion. 2. Suspect small right pleural effusion. 3. Stable chronic bilateral lung opacities. Electronically Signed   By: Keith Rake M.D.   On: 09/16/2022 17:21   MR ABDOMEN MRCP WO CONTRAST  Result Date: 09/16/2022 CLINICAL DATA:  56 year old female with history of jaundice. Nausea. EXAM: MRI ABDOMEN WITHOUT CONTRAST  (INCLUDING MRCP) TECHNIQUE: Multiplanar multisequence MR imaging of the abdomen was performed. Heavily T2-weighted images of the biliary and pancreatic ducts were obtained, and three-dimensional MRCP images were rendered by post processing. COMPARISON:  No prior abdominal MRI or MRCP. Abdominal ultrasound 09/15/2022. FINDINGS: Comment: Portions of today's examination are substantially limited by patient respiratory motion. The study is also severely limited for detection and characterization of visceral and/or vascular lesions by lack of IV gadolinium. Lower chest: Severe cardiomegaly. Massively distended inferior vena cava. Hepatobiliary: No definite suspicious cystic or solid hepatic lesions  are confidently identified on today's motion limited noncontrast examination. No intra or extrahepatic biliary ductal dilatation noted on MRCP images. Common bile duct measures only 3 mm in the porta hepatis. No filling defect within the common bile duct to suggest choledocholithiasis. T1 hyperintense, T2 hypointense material lies dependently within the gallbladder. Gallbladder does not appear overly distended. Accurate assessment for gallbladder wall thickening is limited on today's examination secondary to motion, but there may be some very mild gallbladder wall thickening and edema. No overt pericholecystic fluid or surrounding inflammatory changes. Pancreas: No definite pancreatic mass or peripancreatic fluid collections or inflammatory changes noted on today's noncontrast examination. No pancreatic ductal dilatation. Spleen:  Unremarkable. Adrenals/Urinary Tract: Unenhanced appearance of the kidneys and bilateral adrenal glands is normal. No hydroureteronephrosis in the visualized portions of the abdomen. Stomach/Bowel: Visualized portions are unremarkable. Vascular/Lymphatic: No aneurysm identified in the visualized abdominal vasculature. No lymphadenopathy noted in the abdomen. Other: No significant volume of ascites noted in the visualized portions of the peritoneal cavity. Musculoskeletal: No aggressive appearing osseous lesions are noted in the visualized portions of the skeleton. IMPRESSION: 1. Biliary sludge lying dependently in the gallbladder. Gallbladder wall may be mildly edematous, but is poorly evaluated on today's motion limited examination. If there is any clinical concern for acute cholecystitis, further evaluation with right upper quadrant abdominal ultrasound or nuclear medicine hepatobiliary scan should be considered at this time. 2. No biliary tract dilatation to suggest obstruction. No evidence of choledocholithiasis. 3. Severe cardiomegaly. Electronically Signed   By: Vinnie Langton M.D.    On: 09/16/2022 05:28   US ABDOMEN LIMITED RUQ (LIVER/GB)  Result Date: 09/15/2022 CLINICAL DATA:  Elevated bilirubin EXAM: ULTRASOUND ABDOMEN LIMITED RIGHT UPPER QUADRANT COMPARISON:  CT abdomen and pelvis 12/01/2018 FINDINGS: Gallbladder: Sludge is present within the gallbladder. Small amount of pericholecystic fluid. The gallbladder wall is mildly thickened measuring 5 mm. Sonographic Murphy's sign was positive. Common bile duct: Diameter: 3 mm Liver: No focal lesion identified. Increased echogenicity. Portal vein is patent on Doppler imaging with bidirectional flow. Other: None. IMPRESSION: 1. Findings compatible with acute cholecystitis. 2. 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 Electronically Signed   By: Placido Sou M.D.   On: 09/15/2022 23:07    L/RHC 05/13/2018 Date of Procedure: 05/06/18  ___________________________________________________________________________  _  Findings:  1. No angiographically apparent coronary artery disease.  2. Elevated pulmonary capillary (23 mmHg)  3. Preserved cardiac output (6.2) and index (2.8)   Recommendations:  1. Intensive secondary prevention.  2. Follow up with primary cardiologist.   Complications:   None  ___________________________________________________________________________  _  Referring Physician: Vella Raring, MD  Primary Cardiologist: Vella Raring, MD  Primary Care Physician: Kathee Delton   Performing Attending: Nena Polio, MD  Diagnostic Fellow: Lorrene Reid, MD  Interventional Fellow: NA   Procedures performed: Right Heart Catheterization and Coronary angiography  Access Site: Right Radial Artery  Arterial Closure: TR Band   Contrast Volume: 30 mL  Fluoroscopy Time: 6.3 mins  Radiation Dose: 372 mGy  ___________________________________________________________________________  _  HISTORY   56 yo female with history of HTN, moderate to  severe MR and mild MS  (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     FINDINGS   Right Heart Catheterization   RA mean: 11 mmHg  RV: 63/1 (11) mmHg  PA (mean): 64/29 (44) mmHg  PCWP mean: 23 mmHg  PA Sat: 60 %  Art Sat: 95 %  Fick Cardiac output: 6.2 L/min  Fick Cardiac index: 2.8 L/min/m2   Hemodynamics and Coronary Angiography:    Aortic pressure: 104/73 mmHg   Coronary Angiography   Dominance: Right   Left main (LMCA): LMCA is a large-caliber vessel that originates from  the left coronary sinus. It bifurcates into the left anterior descending  (LAD) and left circumflex (LCx) arteries. There is no angiographic  evidence of significant disease in the LMCA.   Left anterior descending (LAD): The LAD is a large-caliber vessel that  gives off two diagonal (D) branches before it wraps around the apex. D1 is  a moderate caliber vessel with a high take-off. D2 is a small caliber  vessel. There is no angiographic evidence of significant disease in the  LAD.   Circumflex (LCx): The LCx is a medium-caliber vessel that gives off two  obtuse marginal (OM) branches and then continues as a small vessel in the  AV groove. OM1 is a small caliber vessel. OM2 is a moderate caliber  branching vessel. There is no angiographic evidence of significant disease  in the LCx.   Right Coronary (RCA): The RCA is a large-caliber vessel originating from  the right coronary sinus. It bifurcates distally into the posterior  descending artery (PDA) and three posterolateral branches (PL) consistent  with a right dominant system. There is no angiographic evidence of  significant disease in the RCA.   TELEMETRY reviewed by me (LT) 09/25/2022 : Atrial fibrillation versus flutter with variable conduction, rate elevated in the 120s to 130s after missing her morning dose of metoprolol  EKG  reviewed by me: Atrial flutter with 4 1 conduction rate 84, QTc 470 on 10/23  Data reviewed by me (LT) 09/25/2022:  hospitalist progress note, PT ntoes, nursing notes, CBC, CMP, INR, BNP, vitals, telemetry, I/O, repeat K+, quant gold  Principal Problem:   Acute on chronic HFrEF (heart failure with reduced ejection fraction) (HCC) Active Problems:   A-fib (HCC)   HTN (hypertension)   Chronic low back pain (Primary Area of Pain) (Bilateral) w/ sciatica (Left)   Class 3 severe obesity with body mass index (BMI) of 45.0 to 49.9 in adult (HCC)   Moderate to  severe pulmonary hypertension (HCC)   Tobacco dependence   History of cocaine use   Iron deficiency anemia   Abdominal pain   AKI (acute kidney injury) ([place])   Elevated LFTs   Hypokalemia   Hypomagnesemia   Acute metabolic encephalopathy   Epistaxis    ASSESSMENT AND PLAN:  Iracema C. 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  Labs on admission showed acute renal failure (improved with diuresis), transaminitis, and hyperbilirubinemia, ultimately attributed to volume overload from her heart failure exacerbation.  Initial echo on 10/19 revealed a newly reduced LVEF with moderately reduced RV function, and severe TR. Hospital course complicated by atrial fibrillation with RVR and persistently elevated T. bili.  Ongoing primary complaint is generalized abdominal pain. Repeat limited echo on 10/25 after aggressive diuresis showed persistent dilation of hepatic vein, moderate RV enlargement, severely elevated PASP, and severe TR,  c/w severe pulmonary hypertension.  #Positive QuantiFERON gold #Cough -Cough started overnight on 10/26, leukocytosis to 13,700 on 10/26, improved today to 10,800.  She has been afebrile, sputum culture and ID consult pending.   #severe pulmonary hypertension #RV dysfunction Seen on repeat echo 10/25 with severe TR, moderately reduced RV function, severely elevated PASP. [place] in 2019 showed mildly elevated PA pressure 23 mmHg.  She was last seen by pulmonology in 04/2020 for her COPD/asthma but does not seem to be on therapy specific for pulmonary hypertension. Again, unclear what led to her decompensation, per chart review she had not been feeling well (abdominal pain, weakness) since the end of August after her upper endoscopy. -We will continue diuresis as below -recommend outpatient follow up with pulmonology (established with Dr. Lanney Gins)  #Acute on chronic HFrEF (EF 35-40%, global hypokinesis and G3 DD, RV dilation and dysfunction, mild to moderate MR, AR) #Rheumatic mitral valve stenosis s/p MVR 2019 #Abdominal pain BNP on admission was elevated at 558.5 and uptrending to 1800 on 10/25 despite diuresis. Echo 10/19 shows significant reduction in EF 20-25% while in AF RVR from >55% 06/2021 and new AR and MR compared to prior study. Repeat on 10/25 while rate was better controlled and after diuresis was 35-40%. Her abdominal pain was ultimately attributed to volume overload/hepatic insufficiency after acute cholecystitis, choledocholithiasis, and a hematologic disorder were ruled out by general surgery, GI, and hematology.  Her volume status has much improved with aggressive diuresis, and finally feels a little bit better today, 10/27.  Labs trending towards euvolemia. -S/p IV Lasix 40 mg x 15 doses  -Continue IV Lasix 40 mg twice daily for now -S/p metolazone 5 mg x 1 10/24 and 2.5x1 on 10/26.  Will dose another 5 mg today. -Continue spironolactone 12.5 mg once daily -Continue potassium  replacement 40 mEq twice daily -Home diuretics are Lasix 40 p.o. once daily, metolazone 5 mg 3 times weekly  #Paroxysmal atrial fibrillation with RVR, now in atrial flutter Was previously managed well on metoprolol tartrate 100 mg twice daily.  Tachyarrhythmia on admission worsening her RV dysfunction. -S/p sotalol 80 mg once daily (started on 10/20) due to QTc prolongation (401 on 10/21,  470 on 10/23).  Not a great option with aggressive diuresis, persistent hypokalemia. -Intolerant of amiodarone (N/V), digoxin not initiated on admission due to acute renal failure -Increase metoprolol tartrate from 25 mg to 37.5 mg every 8 hours (home dose 100 mg twice daily)  -Discontinue diltiazem due to reduced EF and hypotension -She was on a heparin infusion on admission, has been switched back to her Xarelto, 20 mg okay per her total body weight and creatinine clearance per pharmacy on 10/23. -Xarelto held with hemoptysis/epistaxis overnight on 10/23 was given once on 10/26, but held further today 10/27 due to bloody sputum.  Would recommend GI input for further classification of her hepatic dysfunction, if they recommend switching from a DOAC to another anticoagulant based on this in the long-term, will start heparin per pharmacy consult which can be easily adjusted while she is inpatient and reversed if needed.  Hopefully, her hepatic function (T bili) will improve with diuresis. -Continue rate control strategy, consider DCCV on an outpatient basis after 3 weeks of appropriate anticoagulation -Monitor and replete electrolytes to maintain a K >4, mag >2  #Transaminitis, hyperbilirubinemia s/p MRCP #Labile and elevated INR #Acute encephalopathy, resolved -t bili has remained elevated since admission, back up to 9.0 today (peak of 9.8 on 10/19), ammonia improved from 135 with lactulose to 49 on 10/25. INR improved after vitamin K.   -Encephalopathy felt to be related to opioid analgesics, which have now been  discontinued -Appreciate GI input.  #AKI Presented with a BUN/creatinine of 50/3.52 and GFR 15, much improved after metolazone x1 on 10/24  -Current renal function BUN/creatinine 33/1.27 and GFR 50 -Daily CMP  This patient's plan of care was discussed and created with Dr. Nehemiah Massed and he is in agreement.  Signed: Tristan Schroeder , PA-C 09/25/2022, 1:16 PM [facility]

## 2022-09-25 NOTE — Consult Note (Addendum)
ANTICOAGULATION CONSULT NOTE - Initial Consult  Pharmacy Consult for Heparin infusion Indication: atrial fibrillation  Allergies  Allergen Reactions   Amiodarone Nausea And Vomiting   Aspirin Swelling   Flexeril [Cyclobenzaprine] Swelling   Trazamine [Trazodone & Diet Manage Prod] Nausea And Vomiting   Codeine Rash   Tramadol Rash    Patient Measurements: Height: '5\' 4"'$  (162.6 cm) Weight: 106.8 kg (235 lb 7.2 oz) IBW/kg (Calculated) : 54.7 Heparin Dosing Weight: 83.9 kg  Vital Signs: Temp: 98.1 F (36.7 C) (10/27 1138) Temp Source: Axillary (10/27 0756) BP: 117/93 (10/27 1138) Pulse Rate: 92 (10/27 1138)  Labs: Recent Labs    09/23/22 0546 09/24/22 0427 09/25/22 0449  HGB  --  9.7* 9.6*  HCT  --  35.2* 34.6*  PLT  --  127* 132*  LABPROT 26.9* 20.3* 31.0*  INR 2.5* 1.8* 3.0*  CREATININE 1.25* 1.27* 1.10*    Estimated Creatinine Clearance: 68.9 mL/min (A) (by C-G formula based on SCr of 1.1 mg/dL (H)).   Medical History: Past Medical History:  Diagnosis Date   Acute drug-induced gout of left foot 03/01/2018   Last Assessment & Plan:  Likely at least partially brought on by diuresis.  Needs to continue to diurese  Will push hydration Stop allopurinol given initiation during acute flare may worsen this, re-broach this when asymptomatic Avoid nsaids given stomach pain Trial colchicine Add acetaminophen Ice, elevate, rest   Allergy    seasonal   Anxiety    Arthritis    Right Knee   Asthma    CHF (congestive heart failure) (HCC)    COPD (chronic obstructive pulmonary disease) (HCC)    Coronary artery disease    Leaky heart valve   Diabetes mellitus without complication (HCC)    Dysrhythmia    Fibromyalgia    GERD (gastroesophageal reflux disease)    Hypertension    Pneumonia    PUD (peptic ulcer disease)    Pulmonary HTN (HCC)    Rheumatic fever/heart disease    Sleep apnea     Medications:  Patient was on Xarelto 20 mg po daily with last dose 10/26 @  1905  Assessment: 56yo female presenting with Acute on chronic HF with PMH of Afib.  Patient also found to have potential hepatic dysfunction so PTA Xarelto being held at this time time and heparin drip initiated  Baseline Hl and aPTT pending.  Hgb = 9.6, hct = 34.6, plt = 132  Goal of Therapy:  Will target aPTT goal 66-85 sec and HL 0.3-05 once levels corollate due to bleeding risk.  Monitor platelets by anticoagulation protocol: Yes   Plan:  --No bolus --Start heparin infusion at 1200 units/hr 10/27 @ 2000, which will be approximately 24 hours since last Xarelto dose. --Check aPTT 6 hours after initiation of drip --Check HL and CBC with AM labs  Lorin Picket, PharmD 09/25/2022,2:27 PM

## 2022-09-25 NOTE — Progress Notes (Signed)
[facility] Progress Note  Patient: Chelsea Ramsey    QBV:[id number]  DOA: 09/15/2022     Date of Service: the patient was seen and examined on 09/25/2022  Chief Complaint  Patient presents with   Renal Problem   Brief hospital course: Taken from prior notes.   Patient is 56 year old female with DM, pulm HTN, COPD, CHF, GERD, OSA was seen by PCP on day of admission for not feeling well and abd pain for about a month. Patient complained of pain in RUQ and right flank with nausea.   abnormal outpatient labs showing AKI.   In ED, her labwork confirmed AKI with Cr 3.54, total bilirubin of 9.6.  AST mildly elevated to 57, ALT 35, Alk Phos 80.  U/S was done which showed gallbladder wall thickening to 5 mm with pericholecystic fluid, suspicious for acute cholecystitis.  CBD per U/S was 3 mm.   Surgery was consulted, recommended MRCP - MRCP showed biliary sludge, GB mild edematous. No biliary tract dilation to suggest obstruction.  No choledocholithiasis. 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   Also found to have acute systolic on chronic diastolic heart failure.  Echocardiogram shows EF of 20 to 25% with grade 3 diastolic dysfunction, right ventricular pressure and volume overload.  Prior echo done in December 2019 with a EF of 50 to 55%.  She was started on IV diuresis.   As blood pressure remained soft which interfere with other medical management, she was started on midodrine.  Remained in A-fib with intermittently going in RVR.  Missed Cardizem doses yesterday due to softer blood pressure.  Patient was on heparin infusion for the past 5 days, do not see any plan for cardiac cath by cardiology.  No clear  recommendations. CMP remained with elevated T. bili at 9.1, she continued to have upper abdominal pain. Concern of cardiohepatic with hepatic congestion secondary to heart failure. Secure chat with Dr. Humphrey Rolls and we will discontinue heparin infusion today and restart her home Xarelto.  Patient will get benefit from ischemic work-up with this new HFrEF. Norco switched with oxycodone immediate release to see if that will better serve her pain. No recorded urinary output as she was receiving IV diuresis-message sent to nursing staff. Potassium at 3.1 which is being repleted.  Renal functions continue to improve slowly.   10/23: Discussed with Dr. Nehemiah Massed on chat, there is no need for immediate ischemic work-up. T. bili started improving, at 8.5 today.  Rest of the labs improved.  Renal function stable. Pain with better control after changing to oxycodone.  Awaiting disposition.   10/24: Patient became more lethargic and somnolent in the evening, ABG and ammonia levels were checked and found to have CO2 of 59 and ammonia of 135.  Had an episode of self-limiting epistaxis followed by coughing up some blood overnight.  Patient is on Xarelto for atrial flutter, which is being held. Lactulose enema was added as she is unable to take much p.o. Remained very lethargic and somnolent this morning.  CMP with improvement in creatinine, T. bili at 8.8 with INR of 3.6.  Concern of hepatic failure, another message sent to GI.  Recent liver ultrasound with hepatic steatosis and portal hypertension, no cirrhosis.  VBG was mostly within normal limit except mildly elevated  bicarb. Repeat ammonia levels with some improvement to 65. Currently unable to swallow as pocketing in her mouth, able to answer orientation questions appropriately but response was very slow. Palliative care consult was also placed.   10/25: Patient little more alert and oriented.  Had multiple bowel movements with lactulose enema yesterday,  ammonia at 49.  Worsening BNP at 12/07/2001 today, significant hypokalemia with potassium of 2.2, which is being repleted.  Magnesium at 1.9.  Worsening of T. bili at 9.8 And some improvement of creatinine to 1.25.  Cardiology ordered another limited echocardiogram for reevaluation of her volume status and heart failure. GI started her on rifaximin.  She also received vitamin K with some improvement of INR to 2.5 today.   Patient will remain very high risk for deterioration and mortality.  Currently full code and palliative is on board.  She does not want to talk about her wishes and would like her mother to be a Media planner.    Assessment and Plan:  # Acute on chronic HFrEF (heart failure with reduced ejection fraction)  10/25 TTE shows LVEF 35 to 40%, LV function moderately decreased, global hypokinesis, severe pulmonary hypertension right ventricle function moderately reduced, and moderately enlarged.  Severely dilated right and left atrium. Cardiology was consulted-no plan for more ischemic work-up during current hospitalization as she has clean coronaries in 2019.  Renal function stable with some concern of worsening hepatic function.  T. bili worsening with some improvement of renal function. -Continue with IV Lasix 40 mg twice daily. -Need strict intake and output -Daily weight and CMP -Continue to monitor    Hemoptysis Patient was having cough and hemoptysis Discontinued Xarelto for now, patient is not a very good candidate for anticoagulation due to liver cirrhosis, consider discontinuing on discharge. QuantiFERON gold was sent on 10/19, reported positive on 10/23 CXR Cardiomegaly with mild-to-moderate bilateral interstitial and airspace opacities, likely pulmonary edema Started Mucinex 600 mg p.o. twice daily, Hycodan as needed for cough Follow sputum AFB daily x3 ID consulted   Epistaxis, resolved -Holding Xarelto-concern of hepatic coagulopathy and liver failure -Continue  to monitor   Acute metabolic encephalopathy Patient has a picture of hepatic encephalopathy without any diagnosis of cirrhosis.  Concern of cardiohepatic.   Child's Pugh class C (10 point), not a good candidate for anticoagulation, high risk of bleeding   Recent liver ultrasound with hepatic steatosis and elevated portal pressures.  INR improved to 2.5 after getting 1 dose of vitamin K, worsening T. bili to 9.8, liver enzymes mostly within normal limit.   -Continue lactulose-dose decreased to twice daily due to excessive bowel movements, need to titrate for 2-3 soft bowel movement daily -Continue to monitor    Abdominal pain Pt c/o right sided abd pain for one month.  Work-up so far with MRCP shows biliary sludge, mildly edematous gallbladder, no biliary tract dilation to suggest obstruction.  No choledocholithiasis. 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  T. bili with some worsening at 9.8, iron 2.5 -Gastroenterology discontinued opioids. -Can do low-dose tramadol if needed.     Elevated LFTs Most likely secondary to congestive hepatopathy, also concern of Nash liver disease.  Worsening liver synthetic function. GI and general surgery signed off, initially but GI was reconsulted. Patient is being diuresed. -Continue to monitor   AKI (acute kidney injury) Patient presented with creatinine of 3.54 with baseline around 1. Slowly improving, creatinine at 1.25--1.27--1.1  concern of cardiorenal -Monitor renal function while she  is being diuresed -Avoid renal toxins   HTN (hypertension) Blood pressure remained on lower normal limit. She was started on midodrine with some improvement. -Continue with midodrine -Monitor closely when she is being diuresed with Lasix  amiodarone allergies were noted.       Iron deficiency anemia Pt has h/o IDA / is currently anemic and is on xarelto for a/fib. In afib rvr on ekg.  Pt being  followed by Dr.vanga for her IDA and Eval for GI loss. GI eval showed: Endoscopy Shows Studies Complaint Capsule History Speak, Normal Small Bowel Transit, Scattered Small Bowel Lymphangiectasia's, No Source of Iron Deficiency Anemia Identified - 08/18/2022. Endoscopy shows erythematous  mucosa in the antrum, normal duodenal bulb and second portion of duodenum, colonoscopy showed melanosis coli but was otherwise normal 06/29/2022.  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 Currently hemoglobin stable. Can-continue to monitor -Continue with iron supplement-restarting home meds.   A-fib (HCC) Remained in A-fib with going in and out of RVR.  Concern of atrial flutter -Continue with beta-blocker . -Xarelto is being held due to recent episode of epistaxis and mild hemoptysis and INR  3.0 -Patient will need cardioversion as outpatient per cardiology   Tobacco dependence -Nicotine patch.    History of cocaine use UDS done on 09/16/2022 were positive for benzodiazepines, opioids and tricyclic.     Chronic low back pain (Primary Area of Pain) (Bilateral) w/ sciatica (Left) Seems to have an history of chronic pain syndrome and fibromyalgia. She was on oxycodone and Neurontin at home. -Restarting oxycodone and Neurontin   Class 3 severe obesity with body mass index (BMI) of 45.0 to 49.9 in adult [facility]) Estimated body mass index is 42.76 kg/m as calculated from the following:   Height as of this encounter: 5' 4" (1.626 m).   Weight as of this encounter: 113 kg.    -This will complicate overall prognosis     Moderate to severe pulmonary hypertension (HCC) Chronic respiratory failure. Patient is on 3 to 4 L of oxygen at home.  Currently at baseline. -Continue to monitor    Body mass index is 41.78 kg/m.  Nutrition Problem: Inadequate oral intake Etiology: lethargy/confusion Interventions: Interventions: Ensure Enlive  (each supplement provides 350kcal and 20 grams of protein), Magic cup, MVI, Prostat  Diet: Dysphagia 3 diet  DVT Prophylaxis: SCD, pharmacological prophylaxis contraindicated due to high risk of bleeding    Advance goals of care discussion: DNR  Family Communication: family was NOT present at bedside, at the time of interview.  The pt provided permission to discuss medical plan with the family. Opportunity was given to ask question and all questions were answered satisfactorily.   Disposition:  Pt is from Home, admitted with hepatic anticoagulopathy, CHF and volume overload, still has confusion and erythematous, which precludes a safe discharge. Discharge to SNF, when clinically stable, may require few days more.  Subjective: Overnight patient had an episode of hemoptysis as per RN, patient has not a very good historian, patient was having cough and holding suction which shows bloody mucus. Patient is slightly confused, still complaining of back pain and pointing to epigastric area for abdominal pain.   Physical Exam: General:  awake, but very alert, knows her name and aware that she is in the hospital Appear in mild distress, affect appropriate Eyes: PERRLA ENT: Oral Mucosa Clear, moist  Neck: no JVD,  Cardiovascular: Irregular rhythm, no Murmur appreciated,  Respiratory: good respiratory effort,  Bilateral Air entry equal and Decreased, mild Crackles, no wheezes Abdomen: Bowel Sound present, Soft and mild generalized tenderness,  Skin: No rashes Extremities: 2-3+ Pedal edema, no calf tenderness Neurologic: without any new focal findings Gait not checked due to patient safety concerns  Vitals:   09/25/22 0100 09/25/22 0505 09/25/22 0756 09/25/22 1138  BP: 98/62 103/75 107/76 (!) 117/93  Pulse: 99 98 95 92  Resp: _0 Temp: 98.6 F (37 C) 98 F (36.7 C) 98.4 F (36.9 C) 98.1 F (36.7 C)  TempSrc: Oral Axillary Axillary   SpO2: 100% 100% 100% 100%  Weight:  106.8 kg     Height:        Intake/Output Summary (Last 24 hours) at 09/25/2022 1427 Last data filed at 09/25/2022 0500 Gross per 24 hour  Intake 480 ml  Output 1450 ml  Net -970 ml   Filed Weights   09/21/22 0500 09/22/22 0500 09/25/22 0505  Weight: 113.9 kg 110.4 kg 106.8 kg    Data Reviewed: I have personally reviewed and interpreted daily labs, tele strips, imagings as discussed above. I reviewed all nursing notes, pharmacy notes, vitals, pertinent old records I have discussed plan of care as described above with RN and patient/family.  CBC: Recent Labs  Lab 09/20/22 0317 09/21/22 0555 09/22/22 0019 09/24/22 0427 09/25/22 0449  WBC 5.5 5.4 5.8 13.7* 10.8*  HGB 8.9* 9.0* 9.2* 9.7* 9.6*  HCT 31.4* 32.1* 33.4* 35.2* 34.6*  MCV 78.3* 77.5* 79.9* 81.1 81.2  PLT 174 156 172 127* 630*   Basic Metabolic Panel: Recent Labs  Lab 09/21/22 0555 09/22/22 0439 09/22/22 0839 09/23/22 0546 09/23/22 1338 09/23/22 2131 09/24/22 0427 09/24/22 1435 09/25/22 0449  NA 137 141  --  144  --   --  147*  --  146*  K 3.7 4.1  --  2.2* 2.7* 3.0* 3.1* 3.5 3.0*  CL 98 99  --  97*  --   --  97*  --  97*  CO2 28 30  --  34*  --   --  37*  --  38*  GLUCOSE 75 80  --  84  --   --  90  --  93  BUN 35* 36*  --  32*  --   --  33*  --  32*  CREATININE 1.89* 1.66*  --  1.25*  --   --  1.27*  --  1.10*  CALCIUM 8.6* 8.7*  --  8.9  --   --  9.0  --  9.1  MG  --   --    < > 1.9 1.8 1.7 2.4  --  1.9  PHOS  --   --   --   --   --   --   --   --  2.8   < > = values in this interval not displayed.    Studies: DG Chest Port 1 View  Result Date: 09/25/2022 CLINICAL DATA:  Shortness of breath, renal failure. EXAM: PORTABLE CHEST 1 VIEW COMPARISON:  Chest radiograph dated 09/16/2022. FINDINGS: The heart is enlarged. Mild-to-moderate bilateral interstitial and airspace opacities are redemonstrated. There is no pleural effusion or pneumothorax. Degenerative changes are seen in the spine. A mitral valve  prosthesis and atrial appendage clip, and median sternotomy wires are redemonstrated. IMPRESSION: Cardiomegaly with mild-to-moderate bilateral interstitial and airspace opacities, likely pulmonary edema. Electronically Signed   By: Zerita Boers M.D.   On: 09/25/2022 10:23  Scheduled Meds:  (feeding supplement) PROSource Plus  30 mL Oral TID BM   acidophilus  1 capsule Oral Daily   allopurinol  100 mg Oral Daily   dextromethorphan-guaiFENesin  1 tablet Oral BID   docusate sodium  100 mg Oral BID   feeding supplement  237 mL Oral TID BM   furosemide  40 mg Intravenous Q12H   gabapentin  300 mg Oral BID   lactulose  30 g Oral BID   lactulose  300 mL Rectal BID   metoprolol tartrate  37.5 mg Oral TID   midodrine  10 mg Oral TID WC   multivitamin with minerals  1 tablet Oral Daily   pantoprazole (PROTONIX) IV  40 mg Intravenous Q12H   potassium chloride  40 mEq Oral BID   rifaximin  550 mg Oral BID   sodium chloride flush  3 mL Intravenous Q12H   sodium chloride flush  3 mL Intravenous Q12H   spironolactone  12.5 mg Oral Daily   Continuous Infusions:  sodium chloride     PRN Meds: sodium chloride, acetaminophen **OR** acetaminophen, albuterol, HYDROcodone bit-homatropine, metoprolol tartrate, nitroGLYCERIN, prochlorperazine, sodium chloride flush, traZODone  Time spent: 55 minutes  Author: Val Riles. MD [facility] 09/25/2022 2:27 PM  To reach On-call, see care teams to locate the attending and reach out to them via [email or web address]. If 7PM-7AM, please contact night-coverage If you still have difficulty reaching the attending provider, please page the [facility] (Director on Call) for [facility] on amion for assistance.

## 2022-09-26 DIAGNOSIS — I5023 Acute on chronic systolic (congestive) heart failure: Secondary | ICD-10-CM | POA: Diagnosis not present

## 2022-09-26 LAB — APTT
aPTT: 70 seconds — ABNORMAL HIGH (ref 24–36)
aPTT: 86 seconds — ABNORMAL HIGH (ref 24–36)

## 2022-09-26 LAB — PROTIME-INR
INR: 1.8 — ABNORMAL HIGH (ref 0.8–1.2)
Prothrombin Time: 20.7 seconds — ABNORMAL HIGH (ref 11.4–15.2)

## 2022-09-26 LAB — BASIC METABOLIC PANEL
Anion gap: 11 (ref 5–15)
BUN: 36 mg/dL — ABNORMAL HIGH (ref 6–20)
CO2: 39 mmol/L — ABNORMAL HIGH (ref 22–32)
Calcium: 9 mg/dL (ref 8.9–10.3)
Chloride: 92 mmol/L — ABNORMAL LOW (ref 98–111)
Creatinine, Ser: 1.52 mg/dL — ABNORMAL HIGH (ref 0.44–1.00)
GFR, Estimated: 40 mL/min — ABNORMAL LOW (ref >60)
Glucose, Bld: 118 mg/dL — ABNORMAL HIGH (ref 70–99)
Potassium: 3 mmol/L — ABNORMAL LOW (ref 3.5–5.1)
Sodium: 142 mmol/L (ref 135–145)

## 2022-09-26 LAB — MAGNESIUM: Magnesium: 1.9 mg/dL (ref 1.7–2.4)

## 2022-09-26 LAB — CBC
HCT: 34.2 % — ABNORMAL LOW (ref 36.0–46.0)
Hemoglobin: 9.3 g/dL — ABNORMAL LOW (ref 12.0–15.0)
MCH: 22.8 pg — ABNORMAL LOW (ref 26.0–34.0)
MCHC: 27.2 g/dL — ABNORMAL LOW (ref 30.0–36.0)
MCV: 83.8 fL (ref 80.0–100.0)
Platelets: 118 10*3/uL — ABNORMAL LOW (ref 150–400)
RBC: 4.08 MIL/uL (ref 3.87–5.11)
RDW: 23.7 % — ABNORMAL HIGH (ref 11.5–15.5)
WBC: 11.9 10*3/uL — ABNORMAL HIGH (ref 4.0–10.5)
nRBC: 1.1 % — ABNORMAL HIGH (ref 0.0–0.2)

## 2022-09-26 LAB — BLOOD GAS, ARTERIAL
Acid-Base Excess: 21 mmol/L — ABNORMAL HIGH (ref 0.0–2.0)
Bicarbonate: 48.2 mmol/L — ABNORMAL HIGH (ref 20.0–28.0)
O2 Content: 4 L/min
O2 Saturation: 99.2 %
Patient temperature: 37
pCO2 arterial: 71 mmHg (ref 32–48)
pH, Arterial: 7.44 (ref 7.35–7.45)
pO2, Arterial: 94 mmHg (ref 83–108)

## 2022-09-26 LAB — PHOSPHORUS: Phosphorus: 3.1 mg/dL (ref 2.5–4.6)

## 2022-09-26 LAB — HEPARIN LEVEL (UNFRACTIONATED): Heparin Unfractionated: 0.82 IU/mL — ABNORMAL HIGH (ref 0.30–0.70)

## 2022-09-26 MED ORDER — POTASSIUM CHLORIDE 20 MEQ PO PACK
40.0000 meq | PACK | Freq: Four times a day (QID) | ORAL | Status: AC
  Administered 2022-09-26 (×2): 40 meq via ORAL
  Filled 2022-09-26 (×2): qty 2

## 2022-09-26 MED ORDER — SPIRONOLACTONE 12.5 MG HALF TABLET
12.5000 mg | ORAL_TABLET | Freq: Every day | ORAL | Status: DC
  Administered 2022-09-29 – 2022-09-30 (×2): 12.5 mg via ORAL
  Filled 2022-09-26 (×3): qty 1

## 2022-09-26 MED ORDER — POTASSIUM CHLORIDE 20 MEQ PO PACK: 40.0000 meq | PACK | Freq: Four times a day (QID) | ORAL | Status: DC

## 2022-09-26 MED ORDER — FUROSEMIDE 10 MG/ML IJ SOLN: 40.0000 mg | Freq: Every day | INTRAMUSCULAR | Status: DC

## 2022-09-26 NOTE — Progress Notes (Signed)
SUBJECTIVE: Patient is less short of breath no chest pain   Vitals:   09/26/22 0037 09/26/22 0100 09/26/22 0300 09/26/22 0819  BP: 110/77  110/77 95/77  Pulse: (!) 102 99  92  Resp: (!) '23 20 15 14  '$ Temp: 97.7 F (36.5 C)  98.2 F (36.8 C) (!) 97.5 F (36.4 C)  TempSrc: Oral  Oral Axillary  SpO2: 100%   100%  Weight:      Height:        Intake/Output Summary (Last 24 hours) at 09/26/2022 1148 Last data filed at 09/26/2022 0500 Gross per 24 hour  Intake --  Output 400 ml  Net -400 ml    LABS: Basic Metabolic Panel: Recent Labs    09/25/22 0449 09/25/22 1352 09/26/22 0314  NA 146*  --  142  K 3.0* 3.1* 3.0*  CL 97*  --  92*  CO2 38*  --  39*  GLUCOSE 93  --  118*  BUN 32*  --  36*  CREATININE 1.10*  --  1.52*  CALCIUM 9.1  --  9.0  MG 1.9  --  1.9  PHOS 2.8  --  3.1   Liver Function Tests: Recent Labs    09/24/22 0427 09/25/22 0449  AST 47*  --   ALT 27  --   ALKPHOS 76  --   BILITOT 9.8* 9.0*  PROT 6.6  --   ALBUMIN 2.5*  --    No results for input(s): "LIPASE", "AMYLASE" in the last 72 hours. CBC: Recent Labs    09/25/22 0449 09/26/22 0314  WBC 10.8* 11.9*  HGB 9.6* 9.3*  HCT 34.6* 34.2*  MCV 81.2 83.8  PLT 132* 118*   Cardiac Enzymes: No results for input(s): "CKTOTAL", "CKMB", "CKMBINDEX", "TROPONINI" in the last 72 hours. BNP: Invalid input(s): "POCBNP" D-Dimer: No results for input(s): "DDIMER" in the last 72 hours. Hemoglobin A1C: No results for input(s): "HGBA1C" in the last 72 hours. Fasting Lipid Panel: No results for input(s): "CHOL", "HDL", "LDLCALC", "TRIG", "CHOLHDL", "LDLDIRECT" in the last 72 hours. Thyroid Function Tests: No results for input(s): "TSH", "T4TOTAL", "T3FREE", "THYROIDAB" in the last 72 hours.  Invalid input(s): "FREET3" Anemia Panel: No results for input(s): "VITAMINB12", "FOLATE", "FERRITIN", "TIBC", "IRON", "RETICCTPCT" in the last 72 hours.   PHYSICAL EXAM General: Well developed, well nourished,  in no acute distress HEENT:  Normocephalic and atramatic Neck:  No JVD.  Lungs: Clear bilaterally to auscultation and percussion. Heart: HRRR . Normal S1 and S2 without gallops or murmurs.  Abdomen: Bowel sounds are positive, abdomen soft and non-tender  Msk:  Back normal, normal gait. Normal strength and tone for age. Extremities: No clubbing, cyanosis or edema.   Neuro: Alert and oriented X 3. Psych:  Good affect, responds appropriately  TELEMETRY: A-fib with controlled rate  ASSESSMENT AND PLAN: Atrial fibrillation with controlled ventricular rate at this time.  #2 HFrEF #3 rule out tuberculosis.  Patient is under isolation and is being seen by pulmonary.  Appears to be less short of breath and feeling much better.  Principal Problem:   Acute on chronic HFrEF (heart failure with reduced ejection fraction) (HCC) Active Problems:   A-fib (HCC)   HTN (hypertension)   Chronic low back pain (Primary Area of Pain) (Bilateral) w/ sciatica (Left)   Class 3 severe obesity with body mass index (BMI) of 45.0 to 49.9 in adult (HCC)   Moderate to severe pulmonary hypertension ([place])   Tobacco dependence   History of cocaine  use   Iron deficiency anemia   Abdominal pain   AKI (acute kidney injury) ([place])   Elevated LFTs   Hypokalemia   Hypomagnesemia   Acute metabolic encephalopathy   Epistaxis    Esmeralda Blanford A, MD, [facility] 09/26/2022 11:48 AM

## 2022-09-26 NOTE — Consult Note (Addendum)
ANTICOAGULATION CONSULT NOTE   Pharmacy Consult for Heparin infusion Indication: atrial fibrillation  Allergies  Allergen Reactions   Amiodarone Nausea And Vomiting   Aspirin Swelling   Flexeril [Cyclobenzaprine] Swelling   Trazamine [Trazodone & Diet Manage Prod] Nausea And Vomiting   Codeine Rash   Tramadol Rash    Patient Measurements: Height: '5\' 4"'$  (162.6 cm) Weight: 106.8 kg (235 lb 7.2 oz) IBW/kg (Calculated) : 54.7 Heparin Dosing Weight: 83.9 kg  Vital Signs: Temp: 97.5 F (36.4 C) (10/28 0819) Temp Source: Axillary (10/28 0819) BP: 95/77 (10/28 0819) Pulse Rate: 92 (10/28 0819)  Labs: Recent Labs    09/24/22 0427 09/25/22 0449 09/25/22 1529 09/26/22 0314 09/26/22 0912  HGB 9.7* 9.6*  --  9.3*  --   HCT 35.2* 34.6*  --  34.2*  --   PLT 127* 132*  --  118*  --   APTT  --   --  40* 70* 86*  LABPROT 20.3* 31.0*  --  20.7*  --   INR 1.8* 3.0*  --  1.8*  --   HEPARINUNFRC  --   --  >1.10* 0.82*  --   CREATININE 1.27* 1.10*  --  1.52*  --      Estimated Creatinine Clearance: 49.8 mL/min (A) (by C-G formula based on SCr of 1.52 mg/dL (H)).   Medical History: Past Medical History:  Diagnosis Date   Acute drug-induced gout of left foot 03/01/2018   Last Assessment & Plan:  Likely at least partially brought on by diuresis.  Needs to continue to diurese  Will push hydration Stop allopurinol given initiation during acute flare may worsen this, re-broach this when asymptomatic Avoid nsaids given stomach pain Trial colchicine Add acetaminophen Ice, elevate, rest   Allergy    seasonal   Anxiety    Arthritis    Right Knee   Asthma    CHF (congestive heart failure) (HCC)    COPD (chronic obstructive pulmonary disease) (HCC)    Coronary artery disease    Leaky heart valve   Diabetes mellitus without complication (HCC)    Dysrhythmia    Fibromyalgia    GERD (gastroesophageal reflux disease)    Hypertension    Pneumonia    PUD (peptic ulcer disease)     Pulmonary HTN (HCC)    Rheumatic fever/heart disease    Sleep apnea     Medications:  Patient was on Xarelto 20 mg po daily with last dose 10/26 @ 1905  Assessment: 56yo female presenting with Acute on chronic HF with PMH of Afib.  Patient also found to have potential hepatic dysfunction so PTA Xarelto being held at this time time and heparin drip initiated. Pt had some hemoptysis/epistaxis.   10/23 15:29 HL > 1.1 aPTT 70 10/28 03:14 HL > 0.82 aPTT 86  Goal of Therapy:  Will target aPTT goal 66-85 sec and HL 0.3-0.5 once levels corellate due to bleeding risk.  Monitor platelets by anticoagulation protocol: Yes   Plan:  aPTT is therapeutic (borderline on the upper limit of goal), not yet correlating with heparin level. Will continue with heparin infusion at 1200 units/hr. Recheck aPTT/HL/CBC with AM labs. Switch to heparin level monitoring once aPTT and heparin level correlate.    Oswald Hillock, PharmD 09/26/2022,10:34 AM

## 2022-09-26 NOTE — Progress Notes (Signed)
[facility] Progress Note  Patient: Chelsea Ramsey    MEQ:[id number]  DOA: 09/15/2022     Date of Service: the patient was seen and examined on 09/26/2022  Chief Complaint  Patient presents with   Renal Problem   Brief hospital course: Taken from prior notes.   Patient is 56 year old female with DM, pulm HTN, COPD, CHF, GERD, OSA was seen by PCP on day of admission for not feeling well and abd pain for about a month. Patient complained of pain in RUQ and right flank with nausea.   abnormal outpatient labs showing AKI.   In ED, her labwork confirmed AKI with Cr 3.54, total bilirubin of 9.6.  AST mildly elevated to 57, ALT 35, Alk Phos 80.  U/S was done which showed gallbladder wall thickening to 5 mm with pericholecystic fluid, suspicious for acute cholecystitis.  CBD per U/S was 3 mm.   Surgery was consulted, recommended MRCP - MRCP showed biliary sludge, GB mild edematous. No biliary tract dilation to suggest obstruction.  No choledocholithiasis. 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   Also found to have acute systolic on chronic diastolic heart failure.  Echocardiogram shows EF of 20 to 25% with grade 3 diastolic dysfunction, right ventricular pressure and volume overload.  Prior echo done in December 2019 with a EF of 50 to 55%.  She was started on IV diuresis.   As blood pressure remained soft which interfere with other medical management, she was started on midodrine.  Remained in A-fib with intermittently going in RVR.  Missed Cardizem doses yesterday due to softer blood pressure.  Patient was on heparin infusion for the past 5 days, do not see any plan for cardiac cath by cardiology.  No clear  recommendations. CMP remained with elevated T. bili at 9.1, she continued to have upper abdominal pain. Concern of cardiohepatic with hepatic congestion secondary to heart failure. Secure chat with Dr. Humphrey Rolls and we will discontinue heparin infusion today and restart her home Xarelto.  Patient will get benefit from ischemic work-up with this new HFrEF. Norco switched with oxycodone immediate release to see if that will better serve her pain. No recorded urinary output as she was receiving IV diuresis-message sent to nursing staff. Potassium at 3.1 which is being repleted.  Renal functions continue to improve slowly.   10/23: Discussed with Dr. Nehemiah Massed on chat, there is no need for immediate ischemic work-up. T. bili started improving, at 8.5 today.  Rest of the labs improved.  Renal function stable. Pain with better control after changing to oxycodone.  Awaiting disposition.   10/24: Patient became more lethargic and somnolent in the evening, ABG and ammonia levels were checked and found to have CO2 of 59 and ammonia of 135.  Had an episode of self-limiting epistaxis followed by coughing up some blood overnight.  Patient is on Xarelto for atrial flutter, which is being held. Lactulose enema was added as she is unable to take much p.o. Remained very lethargic and somnolent this morning.  CMP with improvement in creatinine, T. bili at 8.8 with INR of 3.6.  Concern of hepatic failure, another message sent to GI.  Recent liver ultrasound with hepatic steatosis and portal hypertension, no cirrhosis.  VBG was mostly within normal limit except mildly elevated  bicarb. Repeat ammonia levels with some improvement to 65. Currently unable to swallow as pocketing in her mouth, able to answer orientation questions appropriately but response was very slow. Palliative care consult was also placed.   10/25: Patient little more alert and oriented.  Had multiple bowel movements with lactulose enema yesterday,  ammonia at 49.  Worsening BNP at 12/07/2001 today, significant hypokalemia with potassium of 2.2, which is being repleted.  Magnesium at 1.9.  Worsening of T. bili at 9.8 And some improvement of creatinine to 1.25.  Cardiology ordered another limited echocardiogram for reevaluation of her volume status and heart failure. GI started her on rifaximin.  She also received vitamin K with some improvement of INR to 2.5 today.   Patient will remain very high risk for deterioration and mortality.  Currently full code and palliative is on board.  She does not want to talk about her wishes and would like her mother to be a Media planner.    Assessment and Plan:  # Acute on chronic HFrEF (heart failure with reduced ejection fraction)  10/25 TTE shows LVEF 35 to 40%, LV function moderately decreased, global hypokinesis, severe pulmonary hypertension right ventricle function moderately reduced, and moderately enlarged.  Severely dilated right and left atrium. Cardiology was consulted-no plan for more ischemic work-up during current hospitalization as she has clean coronaries in 2019.  Renal function stable with some concern of worsening hepatic function.  T. bili worsening with some improvement of renal function. -Home diuretics are Lasix 40 p.o. once daily, metolazone 5 mg 3 times weekly -s/p IV Lasix 40 mg twice daily. S/p metolazone 5 mg x 1 10/24 and 2.5x1 on 10/26, and 5 mg on 10/27 -Need strict intake and output -Daily weight and CMP -Continue to monitor Cr 1.52 gradually getting worse so held Lasix on 10/28   A-fib ([place]) Remained in A-fib with going in and out of RVR.  Concern of atrial flutter -S/p sotalol 80 mg once daily (started on 10/20) due to QTc prolongation (401 on 10/21, 470 on 10/23). -Intolerant of amiodarone (N/V), digoxin not initiated on admission due to acute renal failure -Discontinue diltiazem due to reduced EF and hypotension -Continue with beta-blocker . -Xarelto is being held  due to recent episode of epistaxis and mild hemoptysis and INR  1.8 -Patient will need cardioversion as outpatient per cardiology 10/27 IV heparin infusion was started by cardiology. consider DCCV on an outpatient basis after 3 weeks of appropriate anticoagulation   Hemoptysis, resolved Patient was having cough and hemoptysis Discontinued Xarelto for now, patient is not a very good candidate for anticoagulation due to liver cirrhosis, consider discontinuing on discharge. QuantiFERON gold was sent on 10/19, reported positive on 10/23 CXR Cardiomegaly with mild-to-moderate bilateral interstitial and airspace opacities, likely pulmonary edema Started Mucinex 600 mg p.o. twice daily, Hycodan as needed for cough Follow sputum AFB every 8 hourly x3 ID consulted   Epistaxis, resolved -Holding Xarelto-concern of hepatic coagulopathy and liver failure -Continue to monitor   Acute metabolic encephalopathy Patient has a picture of hepatic encephalopathy without any diagnosis of cirrhosis.  Concern of cardiohepatic.   Child's Pugh class C (10 point), not a good candidate for anticoagulation, high risk of bleeding   Recent liver ultrasound with hepatic steatosis and elevated portal pressures.  INR improved to 2.5 after getting 1 dose of vitamin K, worsening T. bili to 9.8, liver enzymes mostly within normal limit.   -Continue lactulose-dose decreased to twice daily due to excessive bowel movements,  need to titrate for 2-3 soft bowel movement daily -Continue to monitor    Abdominal pain Pt c/o right sided abd pain for one month.  Work-up so far with MRCP shows biliary sludge, mildly edematous gallbladder, no biliary tract dilation to suggest obstruction.  No choledocholithiasis. 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  T. bili with some worsening at 9.8, iron 2.5 -Gastroenterology discontinued opioids. -Can do low-dose tramadol if needed.      Elevated LFTs Most likely secondary to congestive hepatopathy, also concern of Nash liver disease.  Worsening liver synthetic function. GI and general surgery signed off, initially but GI was reconsulted. Patient is being diuresed. -Continue to monitor   AKI (acute kidney injury) Patient presented with creatinine of 3.54 with baseline around 1. creatinine at 1.25--1.27--1.1--1.52  concern of cardiorenal -Monitor renal function while she is being diuresed -Avoid renal toxins 10/28 skipped Lasix today    HTN (hypertension) Blood pressure remained on lower normal limit. She was started on midodrine with some improvement. -Continue with midodrine -Monitor closely when she is being diuresed with Lasix  amiodarone allergies were noted.       Iron deficiency anemia Pt has h/o IDA / is currently anemic and is on xarelto for a/fib. In afib rvr on ekg.  Pt being followed by Dr.vanga for her IDA and Eval for GI loss. GI eval showed: Endoscopy Shows Studies Complaint Capsule History Speak, Normal Small Bowel Transit, Scattered Small Bowel Lymphangiectasia's, No Source of Iron Deficiency Anemia Identified - 08/18/2022. Endoscopy shows erythematous  mucosa in the antrum, normal duodenal bulb and second portion of duodenum, colonoscopy showed melanosis coli but was otherwise normal 06/29/2022.  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 Currently hemoglobin stable. Can-continue to monitor -Continue with iron supplement-restarting home meds.     Tobacco dependence -Nicotine patch.    History of cocaine use UDS done on 09/16/2022 were positive for benzodiazepines, opioids and tricyclic.     Chronic low back pain (Primary Area of Pain) (Bilateral) w/ sciatica (Left) Seems to have an history of chronic pain syndrome and fibromyalgia. She was on oxycodone and Neurontin at home. -Restarting oxycodone and Neurontin    Class 3 severe obesity with body mass index (BMI) of 45.0 to 49.9 in adult [facility]) Estimated body mass index is 42.76 kg/m as calculated from the following:   Height as of this encounter: _0  (1.626 m).   Weight as of this encounter: 113 kg.    -This will complicate overall prognosis     Moderate to severe pulmonary hypertension (HCC) Chronic respiratory failure. Patient is on 3 to 4 L of oxygen at home.  Currently at baseline. -Continue to monitor    Body mass index is 41.78 kg/m.  Nutrition Problem: Inadequate oral intake Etiology: lethargy/confusion Interventions: Interventions: Ensure Enlive (each supplement provides 350kcal and 20 grams of protein), Magic cup, MVI, Prostat  Diet: Dysphagia 3 diet  DVT Prophylaxis: SCD, pharmacological prophylaxis contraindicated due to high risk of bleeding    Advance goals of care discussion: DNR  Family Communication: family was NOT present at bedside, at the time of interview.  The pt provided permission to discuss medical plan with the family. Opportunity was given to ask question and all questions were answered satisfactorily.   Disposition:  Pt is from Home, admitted with hepatic anticoagulopathy, CHF and volume overload, still has confusion and erythematous, which precludes a safe discharge. Discharge  to SNF, when clinically stable, may require few days more.  Subjective: No significant overnight events, patient feels improvement in the breathing, denies any worsening of shortness of breath, no chest pain or palpitation.  Patient is AAOx3.   Physical Exam: General:  awake, but very alert, knows her name and aware that she is in the hospital Appear in mild distress, affect appropriate Eyes: PERRLA ENT: Oral Mucosa Clear, moist  Neck: no JVD,  Cardiovascular: Irregular rhythm, no Murmur appreciated,  Respiratory: good respiratory effort, Bilateral Air entry equal and Decreased, mild Crackles, no wheezes Abdomen: Bowel Sound  present, Soft and mild generalized tenderness,  Skin: No rashes Extremities: 2-3+ Pedal edema, no calf tenderness Neurologic: without any new focal findings Gait not checked due to patient safety concerns  Vitals:   09/26/22 0037 09/26/22 0100 09/26/22 0300 09/26/22 0819  BP: 110/77  110/77 95/77  Pulse: (!) 102 99  92  Resp: (!) _0 Temp: 97.7 F (36.5 C)  98.2 F (36.8 C) (!) 97.5 F (36.4 C)  TempSrc: Oral  Oral Axillary  SpO2: 100%   100%  Weight:      Height:        Intake/Output Summary (Last 24 hours) at 09/26/2022 1248 Last data filed at 09/26/2022 0500 Gross per 24 hour  Intake --  Output 400 ml  Net -400 ml   Filed Weights   09/21/22 0500 09/22/22 0500 09/25/22 0505  Weight: 113.9 kg 110.4 kg 106.8 kg    Data Reviewed: I have personally reviewed and interpreted daily labs, tele strips, imagings as discussed above. I reviewed all nursing notes, pharmacy notes, vitals, pertinent old records I have discussed plan of care as described above with RN and patient/family.  CBC: Recent Labs  Lab 09/21/22 0555 09/22/22 0019 09/24/22 0427 09/25/22 0449 09/26/22 0314  WBC 5.4 5.8 13.7* 10.8* 11.9*  HGB 9.0* 9.2* 9.7* 9.6* 9.3*  HCT 32.1* 33.4* 35.2* 34.6* 34.2*  MCV 77.5* 79.9* 81.1 81.2 83.8  PLT 156 172 127* 132* 300*   Basic Metabolic Panel: Recent Labs  Lab 09/22/22 0439 09/22/22 0839 09/23/22 0546 09/23/22 1338 09/23/22 2131 09/24/22 0427 09/24/22 1435 09/25/22 0449 09/25/22 1352 09/26/22 0314  NA 141  --  144  --   --  147*  --  146*  --  142  K 4.1  --  2.2* 2.7* 3.0* 3.1* 3.5 3.0* 3.1* 3.0*  CL 99  --  97*  --   --  97*  --  97*  --  92*  CO2 30  --  34*  --   --  37*  --  38*  --  39*  GLUCOSE 80  --  84  --   --  90  --  93  --  118*  BUN 36*  --  32*  --   --  33*  --  32*  --  36*  CREATININE 1.66*  --  1.25*  --   --  1.27*  --  1.10*  --  1.52*  CALCIUM 8.7*  --  8.9  --   --  9.0  --  9.1  --  9.0  MG  --    < > 1.9 1.8  1.7 2.4  --  1.9  --  1.9  PHOS  --   --   --   --   --   --   --  2.8  --  3.1   < > =  values in this interval not displayed.    Studies: No results found.  Scheduled Meds:  (feeding supplement) PROSource Plus  30 mL Oral TID BM   acidophilus  1 capsule Oral Daily   allopurinol  100 mg Oral Daily   dextromethorphan-guaiFENesin  1 tablet Oral BID   docusate sodium  100 mg Oral BID   feeding supplement  237 mL Oral TID BM   [START ON 09/29/2022] furosemide  40 mg Intravenous Daily   gabapentin  300 mg Oral BID   lactulose  30 g Oral BID   lactulose  300 mL Rectal BID   metoprolol tartrate  37.5 mg Oral TID   midodrine  10 mg Oral TID WC   multivitamin with minerals  1 tablet Oral Daily   pantoprazole (PROTONIX) IV  40 mg Intravenous Q12H   potassium chloride  40 mEq Oral Q6H   rifaximin  550 mg Oral BID   sodium chloride flush  3 mL Intravenous Q12H   sodium chloride flush  3 mL Intravenous Q12H   [START ON 09/29/2022] spironolactone  12.5 mg Oral Daily   Continuous Infusions:  sodium chloride     heparin 1,200 Units/hr (09/25/22 2133)   PRN Meds: sodium chloride, acetaminophen **OR** acetaminophen, albuterol, HYDROcodone bit-homatropine, metoprolol tartrate, nitroGLYCERIN, prochlorperazine, sodium chloride flush, traZODone  Time spent: 55 minutes  Author: Val Riles. MD [facility] 09/26/2022 12:48 PM  To reach On-call, see care teams to locate the attending and reach out to them via [email or web address]. If 7PM-7AM, please contact night-coverage If you still have difficulty reaching the attending provider, please page the [facility] (Director on Call) for [facility] on amion for assistance.

## 2022-09-26 NOTE — Consult Note (Signed)
ANTICOAGULATION CONSULT NOTE - Initial Consult  Pharmacy Consult for Heparin infusion Indication: atrial fibrillation  Allergies  Allergen Reactions   Amiodarone Nausea And Vomiting   Aspirin Swelling   Flexeril [Cyclobenzaprine] Swelling   Trazamine [Trazodone & Diet Manage Prod] Nausea And Vomiting   Codeine Rash   Tramadol Rash    Patient Measurements: Height: '5\' 4"'$  (162.6 cm) Weight: 106.8 kg (235 lb 7.2 oz) IBW/kg (Calculated) : 54.7 Heparin Dosing Weight: 83.9 kg  Vital Signs: Temp: 97.7 F (36.5 C) (10/28 0037) Temp Source: Oral (10/28 0037) BP: 110/77 (10/28 0037) Pulse Rate: 99 (10/28 0100)  Labs: Recent Labs    09/24/22 0427 09/25/22 0449 09/25/22 1529 09/26/22 0314  HGB 9.7* 9.6*  --  9.3*  HCT 35.2* 34.6*  --  34.2*  PLT 127* 132*  --  118*  APTT  --   --  40* 70*  LABPROT 20.3* 31.0*  --  20.7*  INR 1.8* 3.0*  --  1.8*  HEPARINUNFRC  --   --  >1.10* 0.82*  CREATININE 1.27* 1.10*  --  1.52*     Estimated Creatinine Clearance: 49.8 mL/min (A) (by C-G formula based on SCr of 1.52 mg/dL (H)).   Medical History: Past Medical History:  Diagnosis Date   Acute drug-induced gout of left foot 03/01/2018   Last Assessment & Plan:  Likely at least partially brought on by diuresis.  Needs to continue to diurese  Will push hydration Stop allopurinol given initiation during acute flare may worsen this, re-broach this when asymptomatic Avoid nsaids given stomach pain Trial colchicine Add acetaminophen Ice, elevate, rest   Allergy    seasonal   Anxiety    Arthritis    Right Knee   Asthma    CHF (congestive heart failure) (HCC)    COPD (chronic obstructive pulmonary disease) (HCC)    Coronary artery disease    Leaky heart valve   Diabetes mellitus without complication (HCC)    Dysrhythmia    Fibromyalgia    GERD (gastroesophageal reflux disease)    Hypertension    Pneumonia    PUD (peptic ulcer disease)    Pulmonary HTN (HCC)    Rheumatic fever/heart  disease    Sleep apnea     Medications:  Patient was on Xarelto 20 mg po daily with last dose 10/26 @ 1905  Assessment: 56yo female presenting with Acute on chronic HF with PMH of Afib.  Patient also found to have potential hepatic dysfunction so PTA Xarelto being held at this time time and heparin drip initiated  Baseline Hl and aPTT pending.  Hgb = 9.6, hct = 34.6, plt = 132  Goal of Therapy:  Will target aPTT goal 66-85 sec and HL 0.3-05 once levels corellate due to bleeding risk.  Monitor platelets by anticoagulation protocol: Yes   Plan: aPTT therapeutic x 1 --Continue heparin infusion at 1200 units/hr --Confirmatory aPTT 6 hours  --Check HL and CBC with AM labs  Wynelle Cleveland, PharmD 09/26/2022,3:52 AM

## 2022-09-27 DIAGNOSIS — I5023 Acute on chronic systolic (congestive) heart failure: Secondary | ICD-10-CM | POA: Diagnosis not present

## 2022-09-27 LAB — CBC
HCT: 33.1 % — ABNORMAL LOW (ref 36.0–46.0)
Hemoglobin: 9.3 g/dL — ABNORMAL LOW (ref 12.0–15.0)
MCH: 23 pg — ABNORMAL LOW (ref 26.0–34.0)
MCHC: 28.1 g/dL — ABNORMAL LOW (ref 30.0–36.0)
MCV: 81.9 fL (ref 80.0–100.0)
Platelets: 112 10*3/uL — ABNORMAL LOW (ref 150–400)
RBC: 4.04 MIL/uL (ref 3.87–5.11)
RDW: 24 % — ABNORMAL HIGH (ref 11.5–15.5)
WBC: 7.5 10*3/uL (ref 4.0–10.5)
nRBC: 0.8 % — ABNORMAL HIGH (ref 0.0–0.2)

## 2022-09-27 LAB — BASIC METABOLIC PANEL
Anion gap: 10 (ref 5–15)
BUN: 37 mg/dL — ABNORMAL HIGH (ref 6–20)
CO2: 38 mmol/L — ABNORMAL HIGH (ref 22–32)
Calcium: 8.6 mg/dL — ABNORMAL LOW (ref 8.9–10.3)
Chloride: 91 mmol/L — ABNORMAL LOW (ref 98–111)
Creatinine, Ser: 1.3 mg/dL — ABNORMAL HIGH (ref 0.44–1.00)
GFR, Estimated: 49 mL/min — ABNORMAL LOW (ref >60)
Glucose, Bld: 77 mg/dL (ref 70–99)
Potassium: 3.6 mmol/L (ref 3.5–5.1)
Sodium: 139 mmol/L (ref 135–145)

## 2022-09-27 LAB — APTT
aPTT: 105 seconds — ABNORMAL HIGH (ref 24–36)
aPTT: 44 seconds — ABNORMAL HIGH (ref 24–36)

## 2022-09-27 LAB — HEPARIN LEVEL (UNFRACTIONATED)
Heparin Unfractionated: 0.39 IU/mL (ref 0.30–0.70)
Heparin Unfractionated: 0.11 IU/mL — ABNORMAL LOW (ref 0.30–0.70)
Heparin Unfractionated: 0.22 IU/mL — ABNORMAL LOW (ref 0.30–0.70)

## 2022-09-27 LAB — PROTIME-INR
INR: 1.5 — ABNORMAL HIGH (ref 0.8–1.2)
Prothrombin Time: 17.7 seconds — ABNORMAL HIGH (ref 11.4–15.2)

## 2022-09-27 LAB — AMMONIA: Ammonia: 51 umol/L — ABNORMAL HIGH (ref 9–35)

## 2022-09-27 LAB — PHOSPHORUS: Phosphorus: 2.7 mg/dL (ref 2.5–4.6)

## 2022-09-27 LAB — MAGNESIUM: Magnesium: 1.8 mg/dL (ref 1.7–2.4)

## 2022-09-27 MED ORDER — FUROSEMIDE 10 MG/ML IJ SOLN
40.0000 mg | Freq: Every day | INTRAMUSCULAR | Status: AC
  Administered 2022-09-27 – 2022-09-29 (×3): 40 mg via INTRAVENOUS
  Filled 2022-09-27 (×3): qty 4

## 2022-09-27 NOTE — Consult Note (Addendum)
ANTICOAGULATION CONSULT NOTE   Pharmacy Consult for Heparin infusion Indication: atrial fibrillation  Allergies  Allergen Reactions   Amiodarone Nausea And Vomiting   Aspirin Swelling   Flexeril [Cyclobenzaprine] Swelling   Trazamine [Trazodone & Diet Manage Prod] Nausea And Vomiting   Codeine Rash   Tramadol Rash    Patient Measurements: Height: '5\' 4"'$  (162.6 cm) Weight: 106 kg (233 lb 11 oz) IBW/kg (Calculated) : 54.7 Heparin Dosing Weight: 83.9 kg  Vital Signs: Temp: 98.6 F (37 C) (10/29 0319) Temp Source: Oral (10/28 2344) BP: 123/70 (10/29 0319) Pulse Rate: 87 (10/29 0319)  Labs: Recent Labs    09/25/22 0449 09/25/22 0449 09/25/22 1529 09/26/22 0314 09/26/22 0912 09/27/22 0551  HGB 9.6*  --   --  9.3*  --  9.3*  HCT 34.6*  --   --  34.2*  --  33.1*  PLT 132*  --   --  118*  --  112*  APTT  --    < > 40* 70* 86* 105*  LABPROT 31.0*  --   --  20.7*  --  17.7*  INR 3.0*  --   --  1.8*  --  1.5*  HEPARINUNFRC  --   --  >1.10* 0.82*  --  0.39  CREATININE 1.10*  --   --  1.52*  --  1.30*   < > = values in this interval not displayed.     Estimated Creatinine Clearance: 58 mL/min (A) (by C-G formula based on SCr of 1.3 mg/dL (H)).   Medical History: Past Medical History:  Diagnosis Date   Acute drug-induced gout of left foot 03/01/2018   Last Assessment & Plan:  Likely at least partially brought on by diuresis.  Needs to continue to diurese  Will push hydration Stop allopurinol given initiation during acute flare may worsen this, re-broach this when asymptomatic Avoid nsaids given stomach pain Trial colchicine Add acetaminophen Ice, elevate, rest   Allergy    seasonal   Anxiety    Arthritis    Right Knee   Asthma    CHF (congestive heart failure) (HCC)    COPD (chronic obstructive pulmonary disease) (HCC)    Coronary artery disease    Leaky heart valve   Diabetes mellitus without complication (HCC)    Dysrhythmia    Fibromyalgia    GERD  (gastroesophageal reflux disease)    Hypertension    Pneumonia    PUD (peptic ulcer disease)    Pulmonary HTN (HCC)    Rheumatic fever/heart disease    Sleep apnea     Medications:  Patient was on Xarelto 20 mg po daily with last dose 10/26 @ 1905  Assessment: 56yo female presenting with Acute on chronic HF with PMH of Afib.  Patient also found to have potential hepatic dysfunction so PTA Xarelto being held at this time time and heparin drip initiated. Pt had some hemoptysis/epistaxis.   10/23 15:29 HL > 1.1 aPTT 70 10/28 03:14 HL > 0.82 aPTT 86 10/29 05:51 HL > 0.39 aPTT 105  Goal of Therapy:  Will target aPTT goal 66-85 sec and HL 0.3-0.5 once levels corellate due to bleeding risk.  Monitor platelets by anticoagulation protocol: Yes   Plan:  aPTT is elevated, not yet correlating with heparin level. Will decrease heparin infusion to  1050 units/hr. Recheck aPTT in 6 hours. Switch to heparin level monitoring once aPTT and heparin level correlate.    Alison Murray, PharmD 09/27/2022,7:24 AM

## 2022-09-27 NOTE — Consult Note (Signed)
ANTICOAGULATION CONSULT NOTE   Pharmacy Consult for Heparin infusion Indication: atrial fibrillation  Allergies  Allergen Reactions   Amiodarone Nausea And Vomiting   Aspirin Swelling   Flexeril [Cyclobenzaprine] Swelling   Trazamine [Trazodone & Diet Manage Prod] Nausea And Vomiting   Codeine Rash   Tramadol Rash    Patient Measurements: Height: '5\' 4"'$  (162.6 cm) Weight: 106 kg (233 lb 11 oz) IBW/kg (Calculated) : 54.7 Heparin Dosing Weight: 83.9 kg  Vital Signs: Temp: 97.6 F (36.4 C) (10/29 0700) Temp Source: Axillary (10/29 0700) BP: 106/83 (10/29 1100) Pulse Rate: 80 (10/29 1100)  Labs: Recent Labs    09/25/22 0449 09/25/22 1529 09/26/22 0314 09/26/22 0912 09/27/22 0551 09/27/22 1357  HGB 9.6*  --  9.3*  --  9.3*  --   HCT 34.6*  --  34.2*  --  33.1*  --   PLT 132*  --  118*  --  112*  --   APTT  --    < > 70* 86* 105* 44*  LABPROT 31.0*  --  20.7*  --  17.7*  --   INR 3.0*  --  1.8*  --  1.5*  --   HEPARINUNFRC  --    < > 0.82*  --  0.39 0.11*  CREATININE 1.10*  --  1.52*  --  1.30*  --    < > = values in this interval not displayed.     Estimated Creatinine Clearance: 58 mL/min (A) (by C-G formula based on SCr of 1.3 mg/dL (H)).   Medical History: Past Medical History:  Diagnosis Date   Acute drug-induced gout of left foot 03/01/2018   Last Assessment & Plan:  Likely at least partially brought on by diuresis.  Needs to continue to diurese  Will push hydration Stop allopurinol given initiation during acute flare may worsen this, re-broach this when asymptomatic Avoid nsaids given stomach pain Trial colchicine Add acetaminophen Ice, elevate, rest   Allergy    seasonal   Anxiety    Arthritis    Right Knee   Asthma    CHF (congestive heart failure) (HCC)    COPD (chronic obstructive pulmonary disease) (HCC)    Coronary artery disease    Leaky heart valve   Diabetes mellitus without complication (HCC)    Dysrhythmia    Fibromyalgia    GERD  (gastroesophageal reflux disease)    Hypertension    Pneumonia    PUD (peptic ulcer disease)    Pulmonary HTN (HCC)    Rheumatic fever/heart disease    Sleep apnea     Medications:  Patient was on Xarelto 20 mg po daily with last dose 10/26 @ 1905  Assessment: 56yo female presenting with Acute on chronic HF with PMH of Afib.  Patient also found to have potential hepatic dysfunction so PTA Xarelto being held at this time time and heparin drip initiated. Pt had some hemoptysis/epistaxis.   10/23 15:29 HL > 1.1 aPTT 70 10/28 03:14 HL > 0.82 aPTT 86 10/29 05:51 HL > 0.39 aPTT 105 10/29 1357 HL 0.11 aPTT 44.   Goal of Therapy:  Will target aPTT goal 66-85 sec and HL 0.3-0.5 once levels corellate due to bleeding risk.  Monitor platelets by anticoagulation protocol: Yes   Plan:  aPTT and heparin level are subtherapeutic. Will adjust heparin infusion back to 1200 units/hr as the patient was therapeutic at this rate. Will switch to heparin level monitoring. Recheck heparin level in 6 hours. CBC daily while on  heparin.    Oswald Hillock, PharmD, BCPS 09/27/2022,2:40 PM

## 2022-09-27 NOTE — Progress Notes (Addendum)
[facility] Progress Note  Patient: Chelsea Ramsey    NTZ:[id number]  DOA: 09/15/2022     Date of Service: the patient was seen and examined on 09/27/2022  Chief Complaint  Patient presents with   Renal Problem   Brief hospital course: Taken from prior notes.   Patient is 56 year old female with DM, pulm HTN, COPD, CHF, GERD, OSA was seen by PCP on day of admission for not feeling well and abd pain for about a month. Patient complained of pain in RUQ and right flank with nausea.   abnormal outpatient labs showing AKI.   In ED, her labwork confirmed AKI with Cr 3.54, total bilirubin of 9.6.  AST mildly elevated to 57, ALT 35, Alk Phos 80.  U/S was done which showed gallbladder wall thickening to 5 mm with pericholecystic fluid, suspicious for acute cholecystitis.  CBD per U/S was 3 mm.   Surgery was consulted, recommended MRCP - MRCP showed biliary sludge, GB mild edematous. No biliary tract dilation to suggest obstruction.  No choledocholithiasis. 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   Also found to have acute systolic on chronic diastolic heart failure.  Echocardiogram shows EF of 20 to 25% with grade 3 diastolic dysfunction, right ventricular pressure and volume overload.  Prior echo done in December 2019 with a EF of 50 to 55%.  She was started on IV diuresis.   As blood pressure remained soft which interfere with other medical management, she was started on midodrine.  Remained in A-fib with intermittently going in RVR.  Missed Cardizem doses yesterday due to softer blood pressure.  Patient was on heparin infusion for the past 5 days, do not see any plan for cardiac cath by cardiology.  No clear  recommendations. CMP remained with elevated T. bili at 9.1, she continued to have upper abdominal pain. Concern of cardiohepatic with hepatic congestion secondary to heart failure. Secure chat with Dr. Humphrey Rolls and we will discontinue heparin infusion today and restart her home Xarelto.  Patient will get benefit from ischemic work-up with this new HFrEF. Norco switched with oxycodone immediate release to see if that will better serve her pain. No recorded urinary output as she was receiving IV diuresis-message sent to nursing staff. Potassium at 3.1 which is being repleted.  Renal functions continue to improve slowly.   10/23: Discussed with Dr. Nehemiah Massed on chat, there is no need for immediate ischemic work-up. T. bili started improving, at 8.5 today.  Rest of the labs improved.  Renal function stable. Pain with better control after changing to oxycodone.  Awaiting disposition.   10/24: Patient became more lethargic and somnolent in the evening, ABG and ammonia levels were checked and found to have CO2 of 59 and ammonia of 135.  Had an episode of self-limiting epistaxis followed by coughing up some blood overnight.  Patient is on Xarelto for atrial flutter, which is being held. Lactulose enema was added as she is unable to take much p.o. Remained very lethargic and somnolent this morning.  CMP with improvement in creatinine, T. bili at 8.8 with INR of 3.6.  Concern of hepatic failure, another message sent to GI.  Recent liver ultrasound with hepatic steatosis and portal hypertension, no cirrhosis.  VBG was mostly within normal limit except mildly elevated  bicarb. Repeat ammonia levels with some improvement to 65. Currently unable to swallow as pocketing in her mouth, able to answer orientation questions appropriately but response was very slow. Palliative care consult was also placed.   10/25: Patient little more alert and oriented.  Had multiple bowel movements with lactulose enema yesterday,  ammonia at 49.  Worsening BNP at 12/07/2001 today, significant hypokalemia with potassium of 2.2, which is being repleted.  Magnesium at 1.9.  Worsening of T. bili at 9.8 And some improvement of creatinine to 1.25.  Cardiology ordered another limited echocardiogram for reevaluation of her volume status and heart failure. GI started her on rifaximin.  She also received vitamin K with some improvement of INR to 2.5 today.   Patient remains at very high risk for deterioration and mortality.  Currently full code and palliative is on board.  She does not want to talk about her wishes and would like her mother to be a Media planner.    Assessment and Plan:  # Acute on chronic HFrEF (heart failure with reduced ejection fraction)  10/25 TTE shows LVEF 35 to 40%, LV function moderately decreased, global hypokinesis, severe pulmonary hypertension right ventricle function moderately reduced, and moderately enlarged.  Severely dilated right and left atrium. Cardiology was consulted-no plan for more ischemic work-up during current hospitalization as she has clean coronaries in 2019.  Renal function stable with some concern of worsening hepatic function.  T. bili worsening with some improvement of renal function. -Home diuretics are Lasix 40 p.o. once daily, metolazone 5 mg 3 times weekly -s/p IV Lasix 40 mg twice daily. S/p metolazone 5 mg x 1 10/24 and 2.5x1 on 10/26, and 5 mg on 10/27 -Need strict intake and output -Daily weight and CMP -Continue to monitor Cr 1.52--1.3 improved after skipping dose of Lasix on 10/28, Resumed lasix 40 mg today    A-fib (HCC) Remained in A-fib with going in and out of RVR.  Concern of atrial flutter -S/p sotalol 80 mg once daily (started on 10/20) due to QTc prolongation (401 on 10/21, 470 on 10/23). -Intolerant of amiodarone (N/V), digoxin not initiated on admission due to acute renal failure -Discontinue diltiazem due to reduced EF and hypotension -Continue with  beta-blocker . -Xarelto is being held due to recent episode of epistaxis and mild hemoptysis and INR  1.5 -Patient will need cardioversion as outpatient per cardiology 10/27 IV heparin infusion was started by cardiology. Cardio is considering DCCV on an outpatient basis after 3 weeks of appropriate anticoagulation   Hemoptysis, resolved Patient was having cough and hemoptysis Discontinued Xarelto for now, patient is not a very good candidate for anticoagulation due to liver cirrhosis, consider discontinuing on discharge. QuantiFERON gold was sent on 10/19, reported positive on 10/23 CXR Cardiomegaly with mild-to-moderate bilateral interstitial and airspace opacities, likely pulmonary edema Started Mucinex 600 mg p.o. twice daily, Hycodan as needed for cough Follow sputum AFB every 8 hourly x3 ID consulted   Epistaxis, resolved -Holding Xarelto-concern of hepatic coagulopathy and liver failure -Continue to monitor   Acute metabolic encephalopathy Patient has a picture of hepatic encephalopathy without any diagnosis of cirrhosis.  Concern of cardiohepatic.   Child's Pugh class C (10 point), not a good candidate for anticoagulation, high risk of bleeding   Recent liver ultrasound with hepatic steatosis and elevated portal pressures.  INR improved to 2.5 after getting 1 dose of vitamin K, worsening T. bili to 9.8, liver enzymes mostly within normal limit.   -Continue lactulose-dose decreased  to twice daily due to excessive bowel movements, need to titrate for 2-3 soft bowel movement daily -Continue to monitor    Abdominal pain Pt c/o right sided abd pain for one month.  Work-up so far with MRCP shows biliary sludge, mildly edematous gallbladder, no biliary tract dilation to suggest obstruction.  No choledocholithiasis. 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  T. bili with some worsening at 9.8, iron 2.5 -Gastroenterology  discontinued opioids. -Can do low-dose tramadol if needed.     Elevated LFTs Most likely secondary to congestive hepatopathy, also concern of Nash liver disease.  Worsening liver synthetic function. GI and general surgery signed off, initially but GI was reconsulted. Patient is being diuresed. -Continue to monitor   AKI (acute kidney injury) Patient presented with creatinine of 3.54 with baseline around 1. creatinine at 1.25--1.27--1.1--1.52 --1.3  concern of cardiorenal -Monitor renal function while she is being diuresed -Avoid renal toxins skipped Lasix on 10/28 and resumed on 10/29    HTN (hypertension) Blood pressure remained on lower normal limit. She was started on midodrine with some improvement. -Continue with midodrine -Monitor closely when she is being diuresed with Lasix  amiodarone allergies were noted.       Iron deficiency anemia Pt has h/o IDA / is currently anemic and is on xarelto for a/fib. In afib rvr on ekg.  Pt being followed by Dr.vanga for her IDA and Eval for GI loss. GI eval showed: Endoscopy Shows Studies Complaint Capsule History Speak, Normal Small Bowel Transit, Scattered Small Bowel Lymphangiectasia's, No Source of Iron Deficiency Anemia Identified - 08/18/2022. Endoscopy shows erythematous  mucosa in the antrum, normal duodenal bulb and second portion of duodenum, colonoscopy showed melanosis coli but was otherwise normal 06/29/2022.  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 Currently hemoglobin stable. Can-continue to monitor -Continue with iron supplement-restarting home meds.     Tobacco dependence -Nicotine patch.    History of cocaine use UDS done on 09/16/2022 were positive for benzodiazepines, opioids and tricyclic.     Chronic low back pain (Primary Area of Pain) (Bilateral) w/ sciatica (Left) Seems to have an history of chronic pain syndrome and  fibromyalgia. She was on oxycodone and Neurontin at home. -Restarting oxycodone and Neurontin   Class 3 severe obesity with body mass index (BMI) of 45.0 to 49.9 in adult [facility]) Estimated body mass index is 42.76 kg/m as calculated from the following:   Height as of this encounter: _0  (1.626 m).   Weight as of this encounter: 113 kg.    -This will complicate overall prognosis     Moderate to severe pulmonary hypertension (HCC) Chronic respiratory failure. Patient is on 3 to 4 L of oxygen at home.  Currently at baseline. -Continue to monitor    Body mass index is 41.78 kg/m.  Nutrition Problem: Inadequate oral intake Etiology: lethargy/confusion Interventions: Interventions: Ensure Enlive (each supplement provides 350kcal and 20 grams of protein), Magic cup, MVI, Prostat  Diet: Dysphagia 3 diet  DVT Prophylaxis: SCD, pharmacological prophylaxis contraindicated due to high risk of bleeding    Advance goals of care discussion: DNR  Family Communication: family was present at bedside, at the time of interview.  The pt provided permission to discuss medical plan with the family. Opportunity was given to ask question and all questions were answered satisfactorily.   Disposition:  Pt is from Home, admitted with hepatic anticoagulopathy, CHF and volume  overload, still has confusion and erythematous, which precludes a safe discharge. Discharge to Home with [facility], when clinically stable, may require few days more.  Subjective: No significant overnight events, patient feels improvement in the breathing, denies any worsening of shortness of breath, no chest pain or palpitation.  Patient is AAOx3. Patient said that she is feeling good  Physical Exam: General:  awake, but very alert, knows her name and aware that she is in the hospital Appear in mild distress, affect appropriate Eyes: PERRLA ENT: Oral Mucosa Clear, moist  Neck: no JVD,  Cardiovascular: Irregular rhythm, no Murmur  appreciated,  Respiratory: good respiratory effort, Bilateral Air entry equal and Decreased, mild Crackles, no wheezes Abdomen: Bowel Sound present, Soft and mild generalized tenderness,  Skin: No rashes Extremities: 2-3+ Pedal edema, no calf tenderness Neurologic: without any new focal findings Gait not checked due to patient safety concerns  Vitals:   09/26/22 2344 09/27/22 0313 09/27/22 0319 09/27/22 0700  BP: 96/73  123/70 96/75  Pulse: 82  87 78  Resp: (!) 22  (!) 22   Temp: 98.4 F (36.9 C)  98.6 F (37 C) 97.6 F (36.4 C)  TempSrc: Oral   Axillary  SpO2: 100%  91% 100%  Weight:  106 kg    Height:        Intake/Output Summary (Last 24 hours) at 09/27/2022 1223 Last data filed at 09/27/2022 3428 Gross per 24 hour  Intake 206.94 ml  Output 450 ml  Net -243.06 ml   Filed Weights   09/22/22 0500 09/25/22 0505 09/27/22 0313  Weight: 110.4 kg 106.8 kg 106 kg    Data Reviewed: I have personally reviewed and interpreted daily labs, tele strips, imagings as discussed above. I reviewed all nursing notes, pharmacy notes, vitals, pertinent old records I have discussed plan of care as described above with RN and patient/family.  CBC: Recent Labs  Lab 09/22/22 0019 09/24/22 0427 09/25/22 0449 09/26/22 0314 09/27/22 0551  WBC 5.8 13.7* 10.8* 11.9* 7.5  HGB 9.2* 9.7* 9.6* 9.3* 9.3*  HCT 33.4* 35.2* 34.6* 34.2* 33.1*  MCV 79.9* 81.1 81.2 83.8 81.9  PLT 172 127* 132* 118* 768*   Basic Metabolic Panel: Recent Labs  Lab 09/23/22 0546 09/23/22 1338 09/23/22 2131 09/24/22 0427 09/24/22 1435 09/25/22 0449 09/25/22 1352 09/26/22 0314 09/27/22 0551  NA 144  --   --  147*  --  146*  --  142 139  K 2.2*   < > 3.0* 3.1* 3.5 3.0* 3.1* 3.0* 3.6  CL 97*  --   --  97*  --  97*  --  92* 91*  CO2 34*  --   --  37*  --  38*  --  39* 38*  GLUCOSE 84  --   --  90  --  93  --  118* 77  BUN 32*  --   --  33*  --  32*  --  36* 37*  CREATININE 1.25*  --   --  1.27*  --  1.10*  --   1.52* 1.30*  CALCIUM 8.9  --   --  9.0  --  9.1  --  9.0 8.6*  MG 1.9   < > 1.7 2.4  --  1.9  --  1.9 1.8  PHOS  --   --   --   --   --  2.8  --  3.1 2.7   < > = values in this interval not displayed.  Studies: No results found.  Scheduled Meds:  (feeding supplement) PROSource Plus  30 mL Oral TID BM   acidophilus  1 capsule Oral Daily   allopurinol  100 mg Oral Daily   dextromethorphan-guaiFENesin  1 tablet Oral BID   docusate sodium  100 mg Oral BID   feeding supplement  237 mL Oral TID BM   [START ON 09/29/2022] furosemide  40 mg Intravenous Daily   gabapentin  300 mg Oral BID   lactulose  30 g Oral BID   lactulose  300 mL Rectal BID   metoprolol tartrate  37.5 mg Oral TID   midodrine  10 mg Oral TID WC   multivitamin with minerals  1 tablet Oral Daily   pantoprazole (PROTONIX) IV  40 mg Intravenous Q12H   rifaximin  550 mg Oral BID   sodium chloride flush  3 mL Intravenous Q12H   sodium chloride flush  3 mL Intravenous Q12H   [START ON 09/29/2022] spironolactone  12.5 mg Oral Daily   Continuous Infusions:  sodium chloride     heparin 1,050 Units/hr (09/27/22 0948)   PRN Meds: sodium chloride, acetaminophen **OR** acetaminophen, albuterol, HYDROcodone bit-homatropine, metoprolol tartrate, nitroGLYCERIN, prochlorperazine, sodium chloride flush, traZODone  Time spent: 55 minutes  Author: Val Riles. MD [facility] 09/27/2022 12:23 PM  To reach On-call, see care teams to locate the attending and reach out to them via [email or web address]. If 7PM-7AM, please contact night-coverage If you still have difficulty reaching the attending provider, please page the [facility] (Director on Call) for [facility] on amion for assistance.

## 2022-09-27 NOTE — Plan of Care (Signed)

## 2022-09-27 NOTE — Progress Notes (Signed)
Patient states she does not want to wear hospital unit tonight and also states she does not see the point in wearing it at all because she does not wear a unit at home. Unit remains at bedside.

## 2022-09-27 NOTE — Progress Notes (Signed)
SUBJECTIVE: Patient feels much better less short of breath with some cough but no hemoptysis   Vitals:   09/27/22 0313 09/27/22 0319 09/27/22 0700 09/27/22 1100  BP:  123/70 96/75 106/83  Pulse:  87 78 80  Resp:  (!) 22    Temp:  98.6 F (37 C) 97.6 F (36.4 C)   TempSrc:   Axillary   SpO2:  91% 100% 100%  Weight: 106 kg     Height:        Intake/Output Summary (Last 24 hours) at 09/27/2022 1305 Last data filed at 09/27/2022 0311 Gross per 24 hour  Intake 206.94 ml  Output 450 ml  Net -243.06 ml    LABS: Basic Metabolic Panel: Recent Labs    09/26/22 0314 09/27/22 0551  NA 142 139  K 3.0* 3.6  CL 92* 91*  CO2 39* 38*  GLUCOSE 118* 77  BUN 36* 37*  CREATININE 1.52* 1.30*  CALCIUM 9.0 8.6*  MG 1.9 1.8  PHOS 3.1 2.7   Liver Function Tests: Recent Labs    09/25/22 0449  BILITOT 9.0*   No results for input(s): "LIPASE", "AMYLASE" in the last 72 hours. CBC: Recent Labs    09/26/22 0314 09/27/22 0551  WBC 11.9* 7.5  HGB 9.3* 9.3*  HCT 34.2* 33.1*  MCV 83.8 81.9  PLT 118* 112*   Cardiac Enzymes: No results for input(s): "CKTOTAL", "CKMB", "CKMBINDEX", "TROPONINI" in the last 72 hours. BNP: Invalid input(s): "POCBNP" D-Dimer: No results for input(s): "DDIMER" in the last 72 hours. Hemoglobin A1C: No results for input(s): "HGBA1C" in the last 72 hours. Fasting Lipid Panel: No results for input(s): "CHOL", "HDL", "LDLCALC", "TRIG", "CHOLHDL", "LDLDIRECT" in the last 72 hours. Thyroid Function Tests: No results for input(s): "TSH", "T4TOTAL", "T3FREE", "THYROIDAB" in the last 72 hours.  Invalid input(s): "FREET3" Anemia Panel: No results for input(s): "VITAMINB12", "FOLATE", "FERRITIN", "TIBC", "IRON", "RETICCTPCT" in the last 72 hours.   PHYSICAL EXAM General: Well developed, well nourished, in no acute distress HEENT:  Normocephalic and atramatic Neck:  No JVD.  Lungs: Clear bilaterally to auscultation and percussion. Heart: HRRR . Normal S1  and S2 without gallops or murmurs.  Abdomen: Bowel sounds are positive, abdomen soft and non-tender  Msk:  Back normal, normal gait. Normal strength and tone for age. Extremities: No clubbing, cyanosis or edema.   Neuro: Alert and oriented X 3. Psych:  Good affect, responds appropriately  TELEMETRY: A-fib with controlled rate  ASSESSMENT AND PLAN: Atrial fibrillation/HFpEF/chronic renal insufficiency.  Patient is on isolation but infectious disease does not feel patient has tuberculosis.  Patient had no further episodes of hemoptysis but he has had some cough and shortness of breath is significantly improved.  Continue to monitor the patient.  Principal Problem:   Acute on chronic HFrEF (heart failure with reduced ejection fraction) (HCC) Active Problems:   A-fib (HCC)   HTN (hypertension)   Chronic low back pain (Primary Area of Pain) (Bilateral) w/ sciatica (Left)   Class 3 severe obesity with body mass index (BMI) of 45.0 to 49.9 in adult (HCC)   Moderate to severe pulmonary hypertension (HCC)   Tobacco dependence   History of cocaine use   Iron deficiency anemia   Abdominal pain   AKI (acute kidney injury) ([place])   Elevated LFTs   Hypokalemia   Hypomagnesemia   Acute metabolic encephalopathy   Epistaxis    Pearson Picou A, MD, [facility] 09/27/2022 1:05 PM

## 2022-09-27 NOTE — Consult Note (Signed)
ANTICOAGULATION CONSULT NOTE   Pharmacy Consult for Heparin infusion Indication: atrial fibrillation  Allergies  Allergen Reactions   Amiodarone Nausea And Vomiting   Aspirin Swelling   Flexeril [Cyclobenzaprine] Swelling   Trazamine [Trazodone & Diet Manage Prod] Nausea And Vomiting   Codeine Rash   Tramadol Rash    Patient Measurements: Height: '5\' 4"'$  (162.6 cm) Weight: 106 kg (233 lb 11 oz) IBW/kg (Calculated) : 54.7 Heparin Dosing Weight: 83.9 kg  Vital Signs: Temp: 98.6 F (37 C) (10/29 2139) Temp Source: Oral (10/29 2139) BP: 113/76 (10/29 2139) Pulse Rate: 91 (10/29 2139)  Labs: Recent Labs    09/25/22 0449 09/25/22 1529 09/26/22 0314 09/26/22 0912 09/27/22 0551 09/27/22 1357 09/27/22 2114  HGB 9.6*  --  9.3*  --  9.3*  --   --   HCT 34.6*  --  34.2*  --  33.1*  --   --   PLT 132*  --  118*  --  112*  --   --   APTT  --    < > 70* 86* 105* 44*  --   LABPROT 31.0*  --  20.7*  --  17.7*  --   --   INR 3.0*  --  1.8*  --  1.5*  --   --   HEPARINUNFRC  --    < > 0.82*  --  0.39 0.11* 0.22*  CREATININE 1.10*  --  1.52*  --  1.30*  --   --    < > = values in this interval not displayed.     Estimated Creatinine Clearance: 58 mL/min (A) (by C-G formula based on SCr of 1.3 mg/dL (H)).   Medical History: Past Medical History:  Diagnosis Date   Acute drug-induced gout of left foot 03/01/2018   Last Assessment & Plan:  Likely at least partially brought on by diuresis.  Needs to continue to diurese  Will push hydration Stop allopurinol given initiation during acute flare may worsen this, re-broach this when asymptomatic Avoid nsaids given stomach pain Trial colchicine Add acetaminophen Ice, elevate, rest   Allergy    seasonal   Anxiety    Arthritis    Right Knee   Asthma    CHF (congestive heart failure) (HCC)    COPD (chronic obstructive pulmonary disease) (HCC)    Coronary artery disease    Leaky heart valve   Diabetes mellitus without complication  (HCC)    Dysrhythmia    Fibromyalgia    GERD (gastroesophageal reflux disease)    Hypertension    Pneumonia    PUD (peptic ulcer disease)    Pulmonary HTN (HCC)    Rheumatic fever/heart disease    Sleep apnea     Medications:  Patient was on Xarelto 20 mg po daily with last dose 10/26 @ 1905  Assessment: 56yo female presenting with Acute on chronic HF with PMH of Afib.  Patient also found to have potential hepatic dysfunction so PTA Xarelto being held at this time time and heparin drip initiated. Pt had some hemoptysis/epistaxis.   10/23 15:29 HL > 1.1 aPTT 70 10/28 03:14 HL > 0.82 aPTT 86 10/29 05:51 HL > 0.39 aPTT 105 10/29 1357 HL 0.11 aPTT 44.  10/29 2114 HL 0.22  Goal of Therapy:  HL 0.3-0.5 due to bleeding risk (correlated with aPTT goal 66-85 sec)  Monitor platelets by anticoagulation protocol: Yes   Plan: heparin level subtherapeutic but up-trending  --Will increase heparin to 1300 units/hr (adjusted slightly-predict that  a continued uptrend may occur after previous adjustment) Recheck heparin level in 6 hours after rate change CBC daily while on heparin.    Glean Salvo, PharmD Clinical Pharmacist  09/27/2022 9:55 PM

## 2022-09-28 ENCOUNTER — Encounter (INDEPENDENT_AMBULATORY_CARE_PROVIDER_SITE_OTHER): Payer: Self-pay

## 2022-09-28 DIAGNOSIS — I10 Essential (primary) hypertension: Secondary | ICD-10-CM

## 2022-09-28 DIAGNOSIS — Z515 Encounter for palliative care: Secondary | ICD-10-CM | POA: Diagnosis not present

## 2022-09-28 DIAGNOSIS — I5023 Acute on chronic systolic (congestive) heart failure: Secondary | ICD-10-CM | POA: Diagnosis not present

## 2022-09-28 DIAGNOSIS — R040 Epistaxis: Secondary | ICD-10-CM | POA: Diagnosis not present

## 2022-09-28 DIAGNOSIS — E669 Obesity, unspecified: Secondary | ICD-10-CM

## 2022-09-28 DIAGNOSIS — D509 Iron deficiency anemia, unspecified: Secondary | ICD-10-CM

## 2022-09-28 DIAGNOSIS — R7612 Nonspecific reaction to cell mediated immunity measurement of gamma interferon antigen response without active tuberculosis: Secondary | ICD-10-CM

## 2022-09-28 DIAGNOSIS — Z7189 Other specified counseling: Secondary | ICD-10-CM

## 2022-09-28 DIAGNOSIS — F1491 Cocaine use, unspecified, in remission: Secondary | ICD-10-CM

## 2022-09-28 DIAGNOSIS — G9341 Metabolic encephalopathy: Secondary | ICD-10-CM | POA: Diagnosis not present

## 2022-09-28 DIAGNOSIS — N179 Acute kidney failure, unspecified: Secondary | ICD-10-CM | POA: Diagnosis not present

## 2022-09-28 DIAGNOSIS — R1010 Upper abdominal pain, unspecified: Secondary | ICD-10-CM | POA: Diagnosis not present

## 2022-09-28 DIAGNOSIS — Z66 Do not resuscitate: Secondary | ICD-10-CM | POA: Diagnosis not present

## 2022-09-28 DIAGNOSIS — R7989 Other specified abnormal findings of blood chemistry: Secondary | ICD-10-CM | POA: Diagnosis not present

## 2022-09-28 LAB — HEPARIN LEVEL (UNFRACTIONATED)
Heparin Unfractionated: 0.28 IU/mL — ABNORMAL LOW (ref 0.30–0.70)
Heparin Unfractionated: 0.31 IU/mL (ref 0.30–0.70)
Heparin Unfractionated: 0.37 IU/mL (ref 0.30–0.70)

## 2022-09-28 LAB — CBC
HCT: 32 % — ABNORMAL LOW (ref 36.0–46.0)
Hemoglobin: 9.1 g/dL — ABNORMAL LOW (ref 12.0–15.0)
MCH: 23.6 pg — ABNORMAL LOW (ref 26.0–34.0)
MCHC: 28.4 g/dL — ABNORMAL LOW (ref 30.0–36.0)
MCV: 83.1 fL (ref 80.0–100.0)
Platelets: 121 10*3/uL — ABNORMAL LOW (ref 150–400)
RBC: 3.85 MIL/uL — ABNORMAL LOW (ref 3.87–5.11)
RDW: 24 % — ABNORMAL HIGH (ref 11.5–15.5)
WBC: 8.7 10*3/uL (ref 4.0–10.5)
nRBC: 0.7 % — ABNORMAL HIGH (ref 0.0–0.2)

## 2022-09-28 LAB — HEPATIC FUNCTION PANEL
ALT: 27 U/L (ref 0–44)
AST: 50 U/L — ABNORMAL HIGH (ref 15–41)
Albumin: 2.3 g/dL — ABNORMAL LOW (ref 3.5–5.0)
Alkaline Phosphatase: 81 U/L (ref 38–126)
Bilirubin, Direct: 4 mg/dL — ABNORMAL HIGH (ref 0.0–0.2)
Indirect Bilirubin: 3.4 mg/dL — ABNORMAL HIGH (ref 0.3–0.9)
Total Bilirubin: 7.4 mg/dL — ABNORMAL HIGH (ref 0.3–1.2)
Total Protein: 6.3 g/dL — ABNORMAL LOW (ref 6.5–8.1)

## 2022-09-28 LAB — PHOSPHORUS: Phosphorus: 3 mg/dL (ref 2.5–4.6)

## 2022-09-28 LAB — BASIC METABOLIC PANEL
Anion gap: 10 (ref 5–15)
BUN: 37 mg/dL — ABNORMAL HIGH (ref 6–20)
CO2: 37 mmol/L — ABNORMAL HIGH (ref 22–32)
Calcium: 8.7 mg/dL — ABNORMAL LOW (ref 8.9–10.3)
Chloride: 90 mmol/L — ABNORMAL LOW (ref 98–111)
Creatinine, Ser: 1.26 mg/dL — ABNORMAL HIGH (ref 0.44–1.00)
GFR, Estimated: 50 mL/min — ABNORMAL LOW (ref >60)
Glucose, Bld: 80 mg/dL (ref 70–99)
Potassium: 3.7 mmol/L (ref 3.5–5.1)
Sodium: 137 mmol/L (ref 135–145)

## 2022-09-28 LAB — MAGNESIUM: Magnesium: 1.8 mg/dL (ref 1.7–2.4)

## 2022-09-28 MED ORDER — METOLAZONE 2.5 MG PO TABS
2.5000 mg | ORAL_TABLET | ORAL | Status: DC
  Administered 2022-09-28 – 2022-09-30 (×2): 2.5 mg via ORAL
  Filled 2022-09-28 (×3): qty 1

## 2022-09-28 MED ORDER — METOPROLOL TARTRATE 50 MG PO TABS
50.0000 mg | ORAL_TABLET | Freq: Two times a day (BID) | ORAL | Status: DC
  Administered 2022-09-28 – 2022-09-30 (×3): 50 mg via ORAL
  Filled 2022-09-28 (×4): qty 1

## 2022-09-28 NOTE — Progress Notes (Signed)
Date of Admission:  09/15/2022      ID: Chelsea Ramsey is a 56 y.o. female  Principal Problem:   Acute on chronic HFrEF (heart failure with reduced ejection fraction) (HCC) Active Problems:   A-fib (HCC)   HTN (hypertension)   Chronic low back pain (Primary Area of Pain) (Bilateral) w/ sciatica (Left)   Class 3 severe obesity with body mass index (BMI) of 45.0 to 49.9 in adult (HCC)   Moderate to severe pulmonary hypertension (HCC)   Tobacco dependence   History of cocaine use   Iron deficiency anemia   Abdominal pain   AKI (acute kidney injury) ([place])   Elevated LFTs   Hypokalemia   Hypomagnesemia   Acute metabolic encephalopathy   Epistaxis    Subjective: Pt is doing better More alert and responding to questions appropriately Hemoptysis improved Breathing better  Medications:   (feeding supplement) PROSource Plus  30 mL Oral TID BM   acidophilus  1 capsule Oral Daily   allopurinol  100 mg Oral Daily   dextromethorphan-guaiFENesin  1 tablet Oral BID   docusate sodium  100 mg Oral BID   feeding supplement  237 mL Oral TID BM   furosemide  40 mg Intravenous Daily   gabapentin  300 mg Oral BID   lactulose  30 g Oral BID   lactulose  300 mL Rectal BID   metoprolol tartrate  50 mg Oral BID   midodrine  10 mg Oral TID WC   multivitamin with minerals  1 tablet Oral Daily   pantoprazole (PROTONIX) IV  40 mg Intravenous Q12H   rifaximin  550 mg Oral BID   sodium chloride flush  3 mL Intravenous Q12H   sodium chloride flush  3 mL Intravenous Q12H   [START ON 09/29/2022] spironolactone  12.5 mg Oral Daily    Objective: Vital signs in last 24 hours: Temp:  [97.7 F (36.5 C)-98.6 F (37 C)] 97.8 F (36.6 C) (10/30 1347) Pulse Rate:  [82-100] 93 (10/30 1347) Resp:  [18-20] 18 (10/30 1347) BP: (103-113)/(66-80) 106/76 (10/30 1347) SpO2:  [99 %-100 %] 100 % (10/30 1347) Weight:  [104.8 kg] 104.8 kg (10/30 0434)    PHYSICAL EXAM:  General: Alert,  cooperative, no distress, appears stated age.  Head: Normocephalic, without obvious abnormality, atraumatic. Eyes: Conjunctivae clear, anicteric sclerae. Pupils are equal ENT Nares normal. No drainage or sinus tenderness. Lips, mucosa, and tongue normal. No Thrush edentulous Neck: Supple, symmetrical, no adenopathy, thyroid: non tender no carotid bruit and no JVD. Back: No CVA tenderness. Lungs: b/l air entry Heart: s1s2 Abdomen: Soft, non-tender,not distended. Bowel sounds normal. No masses Extremities: atraumatic, no cyanosis. No edema. No clubbing Skin: No rashes or lesions. Or bruising Lymph: Cervical, supraclavicular normal. Neurologic: Grossly non-focal  Lab Results     Latest Ref Rng & Units 09/28/2022    6:28 AM 09/27/2022    5:51 AM 09/26/2022    3:14 AM  CMP  Glucose 70 - 99 mg/dL 80  77  118   BUN 6 - 20 mg/dL 37  37  36   Creatinine 0.44 - 1.00 mg/dL 1.26  1.30  1.52   Sodium 135 - 145 mmol/L 137  139  142   Potassium 3.5 - 5.1 mmol/L 3.7  3.6  3.0   Chloride 98 - 111 mmol/L 90  91  92   CO2 22 - 32 mmol/L 37  38  39   Calcium 8.9 - 10.3 mg/dL 8.7  8.6  9.0   Total Protein 6.5 - 8.1 g/dL 6.3     Total Bilirubin 0.3 - 1.2 mg/dL 7.4     Alkaline Phos 38 - 126 U/L 81     AST 15 - 41 U/L 50     ALT 0 - 44 U/L 27           Latest Ref Rng & Units 09/28/2022    6:28 AM 09/27/2022    5:51 AM 09/26/2022    3:14 AM  CBC  WBC 4.0 - 10.5 K/uL 8.7  7.5  11.9   Hemoglobin 12.0 - 15.0 g/dL 9.1  9.3  9.3   Hematocrit 36.0 - 46.0 % 32.0  33.1  34.2   Platelets 150 - 400 K/uL 121  112  118       Assessment/Plan: Encephalopathy- hepatic VS co2 narcossis- much better  Hemoptysis could be from pulmonary HTN, Pulmonary edema also was on xarelto which has been stopped Much improved Because quantiferon gold is positive check sputum for afb X 3 Less likely to be TB No treatment needed currently ? CHF/ cardiomeglay Pulmonary hypertension Rt heart failure   Hepatic  congestion Fatty liver Hyperbilirubinemia Hyper ammonemia   ?? Decompensated liver   Anemia   AKI- improved   Borderline low platelet PAF was on xarelto stopped due to hemoptysis and epistaxis     ?H/o mitral valve repair  Discussed the management with patient and hospitalist

## 2022-09-28 NOTE — TOC CM/SW Note (Signed)
Patient is not able to walk the distance required to go the bathroom, or he/she is unable to safely negotiate stairs required to access the bathroom.  A 3in1 BSC will alleviate this problem  

## 2022-09-28 NOTE — TOC CM/SW Note (Signed)
    Durable Medical Equipment  (From admission, onward)           Start     Ordered   09/28/22 1347  For home use only DME Hospital bed  Once       Question Answer Comment  Length of Need Lifetime   The above medical condition requires: Patient requires the ability to reposition frequently   Head must be elevated greater than: 30 degrees   Bed type Semi-electric   Hoyer Lift Yes   Support Surface: Gel Overlay      09/28/22 1347   09/28/22 1347  For home use only DME 3 n 1  Once        09/28/22 1347   09/28/22 1347  For home use only DME standard manual wheelchair with seat cushion  Once       Comments: Patient suffers from Hypoxia, CHF which impairs their ability to perform daily activities like bathing in the home.  A walker will not resolve issue with performing activities of daily living. A wheelchair will allow patient to safely perform daily activities. Patient can safely propel the wheelchair in the home or has a caregiver who can provide assistance. Length of need Lifetime. Accessories: elevating leg rests (ELRs), wheel locks, extensions and anti-tippers.   09/28/22 1347

## 2022-09-28 NOTE — Consult Note (Signed)
ANTICOAGULATION CONSULT NOTE   Pharmacy Consult for Heparin infusion Indication: atrial fibrillation  Allergies  Allergen Reactions   Amiodarone Nausea And Vomiting   Aspirin Swelling   Flexeril [Cyclobenzaprine] Swelling   Trazamine [Trazodone & Diet Manage Prod] Nausea And Vomiting   Codeine Rash   Tramadol Rash    Patient Measurements: Height: '5\' 4"'$  (162.6 cm) Weight: 104.8 kg (231 lb 0.7 oz) IBW/kg (Calculated) : 54.7 Heparin Dosing Weight: 83.9 kg  Vital Signs: Temp: 98.6 F (37 C) (10/30 0057) Temp Source: Oral (10/30 0057) BP: 112/66 (10/30 0057) Pulse Rate: 82 (10/30 0057)  Labs: Recent Labs    09/26/22 0314 09/26/22 0912 09/27/22 0551 09/27/22 1357 09/27/22 2114 09/28/22 0628  HGB 9.3*  --  9.3*  --   --  9.1*  HCT 34.2*  --  33.1*  --   --  32.0*  PLT 118*  --  112*  --   --  121*  APTT 70* 86* 105* 44*  --   --   LABPROT 20.7*  --  17.7*  --   --   --   INR 1.8*  --  1.5*  --   --   --   HEPARINUNFRC 0.82*  --  0.39 0.11* 0.22* 0.28*  CREATININE 1.52*  --  1.30*  --   --  1.26*     Estimated Creatinine Clearance: 59.5 mL/min (A) (by C-G formula based on SCr of 1.26 mg/dL (H)).   Medical History: Past Medical History:  Diagnosis Date   Acute drug-induced gout of left foot 03/01/2018   Last Assessment & Plan:  Likely at least partially brought on by diuresis.  Needs to continue to diurese  Will push hydration Stop allopurinol given initiation during acute flare may worsen this, re-broach this when asymptomatic Avoid nsaids given stomach pain Trial colchicine Add acetaminophen Ice, elevate, rest   Allergy    seasonal   Anxiety    Arthritis    Right Knee   Asthma    CHF (congestive heart failure) (HCC)    COPD (chronic obstructive pulmonary disease) (HCC)    Coronary artery disease    Leaky heart valve   Diabetes mellitus without complication (HCC)    Dysrhythmia    Fibromyalgia    GERD (gastroesophageal reflux disease)    Hypertension     Pneumonia    PUD (peptic ulcer disease)    Pulmonary HTN (HCC)    Rheumatic fever/heart disease    Sleep apnea     Medications:  Scheduled:   (feeding supplement) PROSource Plus  30 mL Oral TID BM   acidophilus  1 capsule Oral Daily   allopurinol  100 mg Oral Daily   dextromethorphan-guaiFENesin  1 tablet Oral BID   docusate sodium  100 mg Oral BID   feeding supplement  237 mL Oral TID BM   furosemide  40 mg Intravenous Daily   gabapentin  300 mg Oral BID   lactulose  30 g Oral BID   lactulose  300 mL Rectal BID   metoprolol tartrate  37.5 mg Oral TID   midodrine  10 mg Oral TID WC   multivitamin with minerals  1 tablet Oral Daily   pantoprazole (PROTONIX) IV  40 mg Intravenous Q12H   rifaximin  550 mg Oral BID   sodium chloride flush  3 mL Intravenous Q12H   sodium chloride flush  3 mL Intravenous Q12H   [START ON 09/29/2022] spironolactone  12.5 mg Oral Daily  Infusions:   sodium chloride     heparin 1,300 Units/hr (09/27/22 2244)   PRN: sodium chloride, acetaminophen **OR** acetaminophen, albuterol, HYDROcodone bit-homatropine, metoprolol tartrate, nitroGLYCERIN, prochlorperazine, sodium chloride flush, traZODone  Assessment: Chelsea Ramsey is a 56 y.o. female presenting with acute on chronic HF. PMH significant for DM, pulm HTN, COPD, CHF, GERD, OSA, Afib on Xarelto. Patient was on [facility] PTA per chart review, with last dose Xarelto 20 mg 10/26 @ 1905. Patient found to have potential hepatic dysfunction so PTA Xarelto being held at this time. Unclear etiology of liver dysfunction, continue to follow diagnosis/etiology for oral AC options at discharge. Pharmacy has been consulted to initiate and manage heparin infusion.   Baseline Labs 10/27: aPTT 40, HL >1.1, PT 31.0, INR 3.0, Hgb 9.6, Hct 34.6, Plt 132   Date Time HL aPTT Comment  10/28 0314  0.82 86 aPTT therapeutic x1 10/29 0551 0.39  105 aPTT SUPRAtherapeutic 10/29 1357 0.11 44  correlation confirmed, aPTT &  HL SUBtherapeutic  10/29 2114 0.22 --- SUBtherapeutic 10/30 0628 0.28 --- SUBtherapeutic  Goal of Therapy:  HL 0.3-0.5 due to bleeding risk (correlated with aPTT goal 66-85 sec) Monitor platelets by anticoagulation protocol: Yes   Plan:  Increase heparin infusion rate to 1400 units/hr No boluses due to history of hemoptysis Check anti-Xa level in 6 hours and daily while on heparin Continue to monitor H&H and platelets  Thank you for allowing pharmacy to be a part of this patient's care.  Gretel Acre, PharmD PGY1 Pharmacy Resident 09/28/2022 8:09 AM

## 2022-09-28 NOTE — Progress Notes (Signed)
Daily Progress Note   Patient Name: Chelsea Ramsey       Date: 09/28/2022 DOB: [birth year]  Age: 56 y.o. MRN#: [id number] Attending Physician: Loletha Grayer, MD Primary Care Physician: Emelia Loron, NP Admit Date: 09/15/2022 Length of Stay: 12 days  Reason for Consultation/Follow-up: Establishing goals of care  HPI/Patient Profile:  56 y.o. female  with past medical history of type 2 diabetes, pulmonary HTN, COPD, chronic low back pain, CHF, obesity, chronic respiratory failure (3-4 L at home), GERD, OSA, iron deficiency anemia, A-fib (Xarelto), and history of cocaine use (UDS on 10/18 positive for benzodiazepines, opioids, and tricyclics) admitted on 39/76/7341 with RUQ/flank pain with nausea for approximately 1 month.    Patient is being treated for acute on chronic HFrEF (EF of 20 to 25%), acute encephalopathy (metabolic), and diffuse abdominal pain.  GI is following patient closely.   PMT was consulted to discuss [place].   Subjective:   Subjective: Chart Reviewed. Updates received. Patient Assessed. Created space and opportunity for patient  and family to explore thoughts and feelings regarding current medical situation.  Today's Discussion: Per my colleague Jordan Hawks, NP's request I went to bedside to evaluate the patient's mental status and see if goals of care discussion can be had with her.  Previously [place] discussion was had with the patient's mom/next of kin because she was unable to make her decisions.  When I saw her today she was sitting in the edge of bed with physical therapy.  She is awake, alert, oriented x3.  However, she is unable to verbally express her overall health conditions other than "I am here for my heart because it is racing."  At this time her mental status is surely substantially improved, but I am not confident that she has capacity to make her own decisions.  I spoke with the patient's RN for the day who agrees that she often is talking without  saying much, does not think she truly understands what is going on.  At this point I am not confident that she could make her own decisions regarding CODE STATUS and treatment options.  I provided emotional and general support through therapeutic listening, empathy, sharing of stories, and other techniques. I answered all questions and addressed all concerns to the best of my ability.  Review of Systems  Constitutional:  Positive for fatigue.  Respiratory:  Negative for chest tightness and shortness of breath (Improved).   Gastrointestinal:  Negative for abdominal pain, nausea and vomiting.    Objective:   Vital Signs:  BP 103/77 (BP Location: Right Arm)   Pulse 89   Temp 97.7 F (36.5 C) (Oral)   Resp 18   Ht '5\' 4"'$  (1.626 m)   Wt 104.8 kg   LMP 11/08/2009 Comment: menopause  SpO2 100%   BMI 39.66 kg/m   Physical Exam: Physical Exam Vitals and nursing note reviewed.  Constitutional:      General: She is not in acute distress.    Appearance: She is ill-appearing.  HENT:     Head: Normocephalic and atraumatic.  Cardiovascular:     Comments: Noted chest surgical scar Pulmonary:     Effort: Pulmonary effort is normal. No respiratory distress.  Neurological:     Mental Status: She is oriented to person, place, and time.  Psychiatric:        Mood and Affect: Mood normal.        Behavior: Behavior normal.     Palliative Assessment/Data: 50%  Existing Vynca/ACP Documentation: [place] document dated 09/23/2022  Assessment & Plan:   Impression: Present on Admission:  Tobacco dependence  HTN (hypertension)  Iron deficiency anemia  History of cocaine use  A-fib (HCC)  Abdominal pain  AKI (acute kidney injury) ([place])  Elevated LFTs  Acute on chronic HFrEF (heart failure with reduced ejection fraction) (HCC)  Chronic low back pain (Primary Area of Pain) (Bilateral) w/ sciatica (Left)  Moderate to severe pulmonary hypertension ([place])  SUMMARY OF RECOMMENDATIONS    Remain DNR for now Continue current scope of care Continue attempts to have [place] discussions with the patient Spiritual care consult for HCPOA documentation completion PMT will continue to follow  Symptom Management:  Per primary team PMT is available to assist as needed  Code Status: DNR  Prognosis: Unable to determine  Discharge Planning: To Be Determined  Discussed with: Patient, medical team, nursing team  Thank you for allowing [place] to participate in the care of [name] PMT will continue to support holistically.  Billing based on MDM: High  Problems Addressed: One acute or chronic illness or injury that poses a threat to life or bodily function  Amount and/or Complexity of Data: Category 3:Discussion of management or test interpretation with external physician/other qualified health care professional/appropriate source (not separately reported)  Risks: Decision not to resuscitate or to de-escalate care because of poor prognosis (Decision to remain DNR until patient has capacity to discuss/understand CODE STATUS)   Walden Field, NP Palliative Medicine Team  Team Phone # [phone] (Nights/Weekends)  07/29/2021, 8:17 AM

## 2022-09-28 NOTE — Consult Note (Signed)
ANTICOAGULATION CONSULT NOTE   Pharmacy Consult for Heparin infusion Indication: atrial fibrillation  Allergies  Allergen Reactions   Amiodarone Nausea And Vomiting   Aspirin Swelling   Flexeril [Cyclobenzaprine] Swelling   Trazamine [Trazodone & Diet Manage Prod] Nausea And Vomiting   Codeine Rash   Tramadol Rash    Patient Measurements: Height: '5\' 4"'$  (162.6 cm) Weight: 104.8 kg (231 lb 0.7 oz) IBW/kg (Calculated) : 54.7 Heparin Dosing Weight: 83.9 kg  Vital Signs: Temp: 97.8 F (36.6 C) (10/30 1539) Temp Source: Oral (10/30 1539) BP: 102/72 (10/30 1539) Pulse Rate: 92 (10/30 1539)  Labs: Recent Labs    09/26/22 0314 09/26/22 0912 09/27/22 0551 09/27/22 1357 09/27/22 2114 09/28/22 0628 09/28/22 1648  HGB 9.3*  --  9.3*  --   --  9.1*  --   HCT 34.2*  --  33.1*  --   --  32.0*  --   PLT 118*  --  112*  --   --  121*  --   APTT 70* 86* 105* 44*  --   --   --   LABPROT 20.7*  --  17.7*  --   --   --   --   INR 1.8*  --  1.5*  --   --   --   --   HEPARINUNFRC 0.82*  --  0.39 0.11* 0.22* 0.28* 0.31  CREATININE 1.52*  --  1.30*  --   --  1.26*  --      Estimated Creatinine Clearance: 59.5 mL/min (A) (by C-G formula based on SCr of 1.26 mg/dL (H)).   Medical History: Past Medical History:  Diagnosis Date   Acute drug-induced gout of left foot 03/01/2018   Last Assessment & Plan:  Likely at least partially brought on by diuresis.  Needs to continue to diurese  Will push hydration Stop allopurinol given initiation during acute flare may worsen this, re-broach this when asymptomatic Avoid nsaids given stomach pain Trial colchicine Add acetaminophen Ice, elevate, rest   Allergy    seasonal   Anxiety    Arthritis    Right Knee   Asthma    CHF (congestive heart failure) (HCC)    COPD (chronic obstructive pulmonary disease) (HCC)    Coronary artery disease    Leaky heart valve   Diabetes mellitus without complication (HCC)    Dysrhythmia    Fibromyalgia     GERD (gastroesophageal reflux disease)    Hypertension    Pneumonia    PUD (peptic ulcer disease)    Pulmonary HTN (HCC)    Rheumatic fever/heart disease    Sleep apnea     Medications:  Scheduled:   (feeding supplement) PROSource Plus  30 mL Oral TID BM   acidophilus  1 capsule Oral Daily   allopurinol  100 mg Oral Daily   dextromethorphan-guaiFENesin  1 tablet Oral BID   docusate sodium  100 mg Oral BID   feeding supplement  237 mL Oral TID BM   furosemide  40 mg Intravenous Daily   gabapentin  300 mg Oral BID   lactulose  30 g Oral BID   lactulose  300 mL Rectal BID   metolazone  2.5 mg Oral Once per day on Mon Wed Fri   metoprolol tartrate  50 mg Oral BID   midodrine  10 mg Oral TID WC   multivitamin with minerals  1 tablet Oral Daily   pantoprazole (PROTONIX) IV  40 mg Intravenous Q12H   rifaximin  550 mg Oral BID   sodium chloride flush  3 mL Intravenous Q12H   sodium chloride flush  3 mL Intravenous Q12H   [START ON 09/29/2022] spironolactone  12.5 mg Oral Daily   Infusions:   sodium chloride     heparin 1,400 Units/hr (09/28/22 1428)   PRN: sodium chloride, acetaminophen **OR** acetaminophen, albuterol, HYDROcodone bit-homatropine, metoprolol tartrate, nitroGLYCERIN, prochlorperazine, sodium chloride flush, traZODone  Assessment: Chelsea Ramsey is a 56 y.o. female presenting with acute on chronic HF. PMH significant for DM, pulm HTN, COPD, CHF, GERD, OSA, Afib on Xarelto. Patient was on [facility] PTA per chart review, with last dose Xarelto 20 mg 10/26 @ 1905. Patient found to have potential hepatic dysfunction so PTA Xarelto being held at this time. Unclear etiology of liver dysfunction, continue to follow diagnosis/etiology for oral AC options at discharge. Pharmacy has been consulted to initiate and manage heparin infusion.   Baseline Labs 10/27: aPTT 40, HL >1.1, PT 31.0, INR 3.0, Hgb 9.6, Hct 34.6, Plt 132   Date Time HL aPTT Comment  10/28 0314   0.82 86 aPTT therapeutic x1 10/29 0551 0.39  105 aPTT SUPRAtherapeutic 10/29 1357 0.11 44  correlation confirmed, aPTT & HL SUBtherapeutic  10/29 2114 0.22 --- SUBtherapeutic 10/30 0628 0.28 --- SUBtherapeutic 10/30  1648     0.31  therapeuticx1  Goal of Therapy:  HL 0.3-0.5 due to bleeding risk (correlated with aPTT goal 66-85 sec) Monitor platelets by anticoagulation protocol: Yes   Plan:  10/30  1648  HL= 0.31    therapeutic x1 continue heparin infusion rate at 1400 units/hr No boluses due to history of hemoptysis Check confirmatory anti-Xa level in 6 hours  Continue to monitor H&H and platelets daily  Thank you for allowing pharmacy to be a part of this patient's care.  Chinita Greenland PharmD Clinical Pharmacist 09/28/2022

## 2022-09-28 NOTE — Assessment & Plan Note (Signed)
Last hemoglobin 9.3.  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

## 2022-09-28 NOTE — Assessment & Plan Note (Signed)
Last BMI 39.66 with current height and weight in computer.

## 2022-09-28 NOTE — Assessment & Plan Note (Signed)
Chronic respiratory failure. Patient is on 3 to 4 L of oxygen at home.   Severe pulmonary hypertension on echocardiogram

## 2022-09-28 NOTE — TOC Progression Note (Addendum)
Transition of Care [facility]) - Progression Note    Patient Details  Name: Chelsea Ramsey MRN: [id number] Date of Birth: 07-04-1966  Transition of Care [facility]) CM/SW [place], LCSW Phone Number: 09/28/2022, 2:09 PM  Clinical Narrative:   Ordered hospital bed, wheelchair, and 3-in-1 through [employer]. Left voicemail for mom to notify.  2:34 pm: Received call from mom. Provided update. Asked MD to call her with update on her medical condition.  Expected Discharge Plan and Services                                                 Social Determinants of Health (SDOH) Interventions    Readmission Risk Interventions     No data to display

## 2022-09-28 NOTE — Progress Notes (Signed)
Progress Note   Patient: Chelsea Ramsey MEQ:[id number] DOB: 03-19-1966 DOA: 09/15/2022     12 DOS: the patient was seen and examined on 09/28/2022   Brief hospital course: Taken from prior notes.  Patient is 56 year old female with DM, pulm HTN, COPD, CHF, GERD, OSA was seen by PCP on day of admission for not feeling well and abd pain for about a month. Patient complained of pain in RUQ and right flank with nausea.   abnormal outpatient labs showing AKI.   In ED, her labwork confirmed AKI with Cr 3.54, total bilirubin of 9.6.  AST mildly elevated to 57, ALT 35, Alk Phos 80.  U/S was done which showed gallbladder wall thickening to 5 mm with pericholecystic fluid, suspicious for acute cholecystitis.  CBD per U/S was 3 mm.   Surgery was consulted, recommended MRCP - MRCP showed biliary sludge, GB mild edematous. No biliary tract dilation to suggest obstruction.  No choledocholithiasis. 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  Also found to have acute systolic on chronic diastolic heart failure.  Echocardiogram shows EF of 20 to 25% with grade 3 diastolic dysfunction, right ventricular pressure and volume overload.  Prior echo done in December 2019 with a EF of 50 to 55%.  She was started on IV diuresis.  As blood pressure remained soft which interfere with other medical management, she was started on midodrine.  Remained in A-fib with intermittently going in RVR.  Missed Cardizem doses yesterday due to softer blood pressure.  Patient was on heparin infusion for the past 5 days, do not see any plan for cardiac cath by cardiology.  No clear recommendations. CMP remained with elevated T. bili at 9.1, she continued to have  upper abdominal pain. Concern of cardiohepatic with hepatic congestion secondary to heart failure. Secure chat with Dr. Humphrey Rolls and we will discontinue heparin infusion today and restart her home Xarelto.  Patient will get benefit from ischemic work-up with this new HFrEF. Norco switched with oxycodone immediate release to see if that will better serve her pain. No recorded urinary output as she was receiving IV diuresis-message sent to nursing staff. Potassium at 3.1 which is being repleted.  Renal functions continue to improve slowly.  10/23: Discussed with Dr. Nehemiah Massed on chat, there is no need for immediate ischemic work-up. T. bili started improving, at 8.5 today.  Rest of the labs improved.  Renal function stable. Pain with better control after changing to oxycodone.  Awaiting disposition.  10/24: Patient became more lethargic and somnolent in the evening, ABG and ammonia levels were checked and found to have CO2 of 59 and ammonia of 135.  Had an episode of self-limiting epistaxis followed by coughing up some blood overnight.  Patient is on Xarelto for atrial flutter, which is being held. Lactulose enema was added as she is unable to take much p.o. Remained very lethargic and somnolent this morning.  CMP with improvement in creatinine, T. bili at 8.8 with INR of 3.6.  Concern of hepatic failure, another message sent to GI.  Recent liver ultrasound with hepatic steatosis and portal hypertension, no cirrhosis.  VBG was mostly within normal limit except mildly elevated bicarb. Repeat ammonia levels with some improvement to 65. Currently unable to swallow as pocketing in her mouth, able to  answer orientation questions appropriately but response was very slow. Palliative care consult was also placed.  10/25: Patient little more alert and oriented.  Had multiple bowel movements with lactulose enema yesterday, ammonia at 49.  Worsening BNP at 12/07/2001 today, significant hypokalemia with potassium of  2.2, which is being repleted.  Magnesium at 1.9.  Worsening of T. bili at 9.8 And some improvement of creatinine to 1.25.  Cardiology ordered another limited echocardiogram for reevaluation of her volume status and heart failure. GI started her on rifaximin.  She also received vitamin K with some improvement of INR to 2.5 today.  Patient will remain very high risk for deterioration and mortality.  Currently DNR and palliative team is following.    Assessment and Plan: * Acute on chronic HFrEF (heart failure with reduced ejection fraction) ([facility]) Patient was found to have new onset HFrEF with EF of 20 to 25% and grade 3 diastolic dysfunction.  Cardiology was consulted-no plan for more ischemic work-up during current hospitalization as she has clean coronaries in 2019. -Continue with IV Lasix 40 mg daily.  Patient on metoprolol, spironolactone   Epistaxis No further episodes.  Heparin drip.  Will need to convert back to oral blood thinner at some point.  Acute metabolic encephalopathy Patient has a picture of hepatic encephalopathy without any diagnosis of cirrhosis.  Concern of cardiohepatic.  Patient on lactulose and Xifaxan.   Abdominal pain Pt c/o right sided abd pain for one month.  Work-up so far with MRCP shows biliary sludge, mildly edematous gallbladder, no biliary tract dilation to suggest obstruction.  No choledocholithiasis.    Elevated LFTs Most likely secondary to congestive hepatopathy.  Severe hepatic steatosis seen on imaging.  Total bilirubin still elevated 7.4, AST slightly elevated at 59 and ALT normalized at 27.  Alkaline phosphatase normal  AKI (acute kidney injury) [place]) Patient presented with creatinine of 3.54 with baseline around 1. Slowly improving, creatinine at 1.26 today  HTN (hypertension) Patient on midodrine to elevate blood pressure, Lasix, metoprolol and Aldactone    Iron deficiency anemia Last hemoglobin 9.1.  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  A-fib (HCC) Remained in A-fib with going in and out of RVR.  Currently on metoprolol.  Currently on heparin drip.  Tobacco dependence -Nicotine patch.   History of cocaine use UDS done on 09/16/2022 were positive for benzodiazepines, opioids and tricyclic.   Chronic low back pain (Primary Area of Pain) (Bilateral) w/ sciatica (Left) Seems to have an history of chronic pain syndrome and fibromyalgia. She was on oxycodone and Neurontin at home. -Restarting oxycodone and Neurontin  Hypokalemia Replaced during hospital course  Hypomagnesemia Continue replacement.  Moderate to severe pulmonary hypertension (HCC) Chronic respiratory failure. Patient is on 3 to 4 L of oxygen at home.   Severe pulmonary hypertension on echocardiogram  Obesity (BMI 30-39.9) Last BMI 39.66 with current height and weight in computer.  Positive QuantiFERON-TB Gold test Patient having difficulty bringing up sputum.  We will get 2 more inductions for AF[employer].  No current hemoptysis        Subjective:   Physical Exam: Vitals:   09/28/22 0434 09/28/22 0834 09/28/22 1347 09/28/22 1539  BP:  103/77 106/76 102/72  Pulse:  89 93 92  Resp:  _0 Temp:  97.7 F (36.5 C) 97.8 F (36.6 C) 97.8 F (36.6 C)  TempSrc:  Oral Oral Oral  SpO2:  100% 100% 100%  Weight: 104.8 kg     Height:       Physical Exam HENT:     Head: Normocephalic.     Mouth/Throat:     Pharynx: No oropharyngeal exudate.  Eyes:     General: Lids are normal.     Conjunctiva/sclera: Conjunctivae normal.  Cardiovascular:     Rate and Rhythm: Normal rate and regular rhythm.     Heart sounds: Normal heart sounds, S1 normal and S2 normal.  Pulmonary:     Breath sounds: Examination of the right-lower field reveals decreased breath sounds. Examination of the left-lower field reveals decreased breath sounds. Decreased breath  sounds present. No wheezing, rhonchi or rales.  Abdominal:     Palpations: Abdomen is soft.     Tenderness: There is no abdominal tenderness.  Musculoskeletal:     Right lower leg: Swelling present.     Left lower leg: Swelling present.  Skin:    General: Skin is warm.     Findings: No rash.  Neurological:     Mental Status: She is alert.     Data Reviewed:   Family Communication: spoke with Mother on the phone  Disposition: Status is: Inpatient Remains inpatient appropriate because: Still very weak.  Planned Discharge Destination: Rehab    Time spent: 28 minutes  Author: Loletha Grayer, MD 09/28/2022 5:46 PM  For on call review [email or web address].

## 2022-09-28 NOTE — Progress Notes (Signed)
Physical Therapy Treatment Patient Details Name: Kursten Kruk MRN: [id number] DOB: [birth year] Today's Date: 09/28/2022   History of Present Illness Pt is a 56 yo female seen by PCP secondary to not feeling well with abd pain for about a month. MD assessment includes: Right sided abdominal pain, transaminitis, hyperbilirubinemia, AKI, and iron deficiency anemia.    PT Comments    Patient is eager to mobilize. She reports she wants to go home and is planning to return home with support from family. She continues to require significant assistance for mobility. Several unsuccessful standing attempts performed and patient finally able to stand from an elevated bed height with maximal assistance from therapist. She was able to stand ~ 2 minutes with rolling walker for support and take 2 smalls steps with moderate assistance along the edge of bed. Poor dynamic standing balance with loss of balance posteriorly. Would recommend +2 person for any true ambulation efforts. She has generalized weakness and will require physical assistance for mobility at discharge. SNF is recommended, although patient still wants to return home. Will continue to follow to maximize independence and decrease caregiver burden.    Recommendations for follow up therapy are one component of a multi-disciplinary discharge planning process, led by the attending physician.  Recommendations may be updated based on patient status, additional functional criteria and insurance authorization.  Follow Up Recommendations  Skilled nursing-short term rehab (<3 hours/day) Can patient physically be transported by private vehicle: No   Assistance Recommended at Discharge Frequent or constant Supervision/Assistance  Patient can return home with the following Two people to help with walking and/or transfers;Two people to help with bathing/dressing/bathroom;Assistance with cooking/housework;Help with stairs or ramp for entrance;Assist  for transportation   Equipment Recommendations  Hospital bed;Wheelchair (measurements PT);BSC/3in1 (if going home)    Recommendations for Other Services       Precautions / Restrictions Precautions Precautions: Fall Restrictions Weight Bearing Restrictions: No     Mobility  Bed Mobility Overal bed mobility: Needs Assistance Bed Mobility: Supine to Sit, Sit to Supine     Supine to sit: Mod assist Sit to supine: Mod assist   General bed mobility comments: assistance for RLE and trunk support to sit upright. assistance for BLE to return to bed. verbal cues for technique with increased time required to complete tasks. no coughing is noted with mobility today    Transfers Overall transfer level: Needs assistance Equipment used: Rolling walker (2 wheels) Transfers: Sit to/from Stand Sit to Stand: Max assist, From elevated surface          Lateral/Scoot Transfers: Max assist General transfer comment: patient was unable to stand with one person assistance with the bed in the lowest height. with the bed height slightly elevated, patient still required maximal assistance with increased effort required. several standing bouts attempted unsuccessfully. maximal assistance for lateral scooting transfer to the left performed x 4 bouts. maximal cues for technique, BLE positioning, hand placement    Ambulation/Gait               General Gait Details: with moderate assistance, patient was able to take 2 smalle side steps to the left with rolling walker (relying on bed for posterior leg support and + loss of balance posteriorly). activity tolerance limited by fatigue in standing with standing tolerance of less than 2 minutes   Stairs             Wheelchair Mobility    Modified Rankin (Stroke Patients Only)  Balance Overall balance assessment: Needs assistance Sitting-balance support: Feet supported Sitting balance-Leahy Scale: Fair     Standing balance  support: Bilateral upper extremity supported Standing balance-Leahy Scale: Fair Standing balance comment: Min guard for safety                            Cognition Arousal/Alertness: Awake/alert Behavior During Therapy: Flat affect Overall Cognitive Status: No family/caregiver present to determine baseline cognitive functioning                                 General Comments: patient is able to follow single step commands with increased time        Exercises      General Comments        Pertinent Vitals/Pain Pain Assessment Pain Assessment: No/denies pain    Home Living                          Prior Function            PT Goals (current goals can now be found in the care plan section) Acute Rehab PT Goals Patient Stated Goal: to go home PT Goal Formulation: With patient Time For Goal Achievement: 09/30/22 Potential to Achieve Goals: Fair Progress towards PT goals: Progressing toward goals    Frequency    Min 2X/week      PT Plan Current plan remains appropriate    Co-evaluation              AM-PAC PT "6 Clicks" Mobility   Outcome Measure  Help needed turning from your back to your side while in a flat bed without using bedrails?: A Lot Help needed moving from lying on your back to sitting on the side of a flat bed without using bedrails?: Total Help needed moving to and from a bed to a chair (including a wheelchair)?: A Lot Help needed standing up from a chair using your arms (e.g., wheelchair or bedside chair)?: A Lot Help needed to walk in hospital room?: Total Help needed climbing 3-5 steps with a railing? : Total 6 Click Score: 9    End of Session Equipment Utilized During Treatment: Oxygen Activity Tolerance: Patient limited by fatigue Patient left: in bed;with call bell/phone within reach;with bed alarm set (bed in chair position to promote pulmonary hygiene and upright conditioning) Nurse  Communication: Mobility status PT Visit Diagnosis: Muscle weakness (generalized) (M62.81);Difficulty in walking, not elsewhere classified (R26.2);Unsteadiness on feet (R26.81)     Time: 6948-5462 PT Time Calculation (min) (ACUTE ONLY): 48 min  Charges:  $Therapeutic Activity: 38-52 mins                     Minna Merritts, PT, MPT    Percell Locus 09/28/2022, 11:41 AM

## 2022-09-28 NOTE — Assessment & Plan Note (Signed)
Patient having difficulty bringing up sputum.  We will get 2 more inductions for [employer].  No current hemoptysis

## 2022-09-28 NOTE — Progress Notes (Addendum)
Patient was seen evaluated and examined by me and the PA on 09/28/22.  Course of action, evaluation, and management decisions were developed solely by me, but detailed below in the PA's note. [place] NOTE       Patient ID: Chelsea Ramsey MRN: [id number] DOB/AGE: July 05, 1966 56 y.o.  Admit date: 09/15/2022 Referring Physician Dr. Estill Cotta Primary Physician Dr. Gracy Racer Primary Cardiologist Dr. 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  Labs on admission showed acute renal failure (improved with diuresis), transaminitis, and hyperbilirubinemia, ultimately attributed to volume overload from her heart failure exacerbation.  Initial echo on 10/19 revealed a newly reduced LVEF with moderately reduced RV function, and severe TR. Hospital course complicated by atrial fibrillation with RVR and persistently elevated T. bili.  Ongoing primary complaint is generalized abdominal pain. Repeat limited echo on 10/25 after aggressive diuresis showed persistent dilation of hepatic vein, moderate RV enlargement, severely elevated PASP, and severe TR, c/w severe pulmonary hypertension.  Interval history: -alert and the most interactive and conversational I have seen her since admission. Feels "awesome" today and denies abdominal pain, chest pain, or shortness of breath.  -no further hemoptysis or productive cough, has a dry cough which is  chronic for her -Able to talk at length about how she stopped taking most of her medications in the weeks preceding her hospitalization because she was depressed. She did not feel like eating, drank many sodas which was unusual for her, might have missed a couple doses of her lasix but kept taking her metolazone. -remains significantly weak, requiring max PT assist to stand with a walker. -denies recent travel outside of the [place], known contacts with someone with TB, lives at home with her husband -renal function stable, T bili improving slightly     Past Medical History:  Diagnosis Date   Acute drug-induced gout of left foot 03/01/2018   Last Assessment & Plan:  Likely at least partially brought on by diuresis.  Needs to continue to diurese  Will push hydration Stop allopurinol given initiation during acute flare may worsen this, re-broach this when asymptomatic Avoid nsaids given stomach pain Trial colchicine Add acetaminophen Ice, elevate, rest   Allergy    seasonal   Anxiety    Arthritis    Right Knee   Asthma    CHF (congestive heart failure) (HCC)    COPD (chronic obstructive pulmonary disease) (HCC)    Coronary artery disease    Leaky heart valve   Diabetes mellitus without complication (HCC)    Dysrhythmia    Fibromyalgia    GERD (gastroesophageal reflux disease)    Hypertension    Pneumonia    PUD (peptic ulcer disease)    Pulmonary HTN (HCC)    Rheumatic fever/heart disease    Sleep apnea     Past Surgical History:  Procedure Laterality Date   ADENOIDECTOMY     COLONOSCOPY WITH PROPOFOL N/A 11/06/2019   Procedure: COLONOSCOPY WITH PROPOFOL;  Surgeon: Virgel Manifold, MD;  Location[facility] ENDOSCOPY;  Service: Endoscopy;  Laterality: N/A;  2 day prep    COLONOSCOPY WITH PROPOFOL N/A 06/29/2022   Procedure: COLONOSCOPY WITH PROPOFOL;  Surgeon: Lin Landsman, MD;  Location: [facility] ENDOSCOPY;  Service: Gastroenterology;  Laterality: N/A;   CORONARY ARTERY BYPASS GRAFT      June 27 2018   ESOPHAGOGASTRODUODENOSCOPY (EGD) WITH PROPOFOL N/A 05/25/2018   Procedure: ESOPHAGOGASTRODUODENOSCOPY (EGD) WITH PROPOFOL;  Surgeon: Lucilla Lame, MD;  Location: [facility] ENDOSCOPY;  Service: Endoscopy;  Laterality: N/A;   ESOPHAGOGASTRODUODENOSCOPY (EGD) WITH PROPOFOL N/A 11/06/2019   Procedure: ESOPHAGOGASTRODUODENOSCOPY (EGD) WITH PROPOFOL;  Surgeon: Virgel Manifold, MD;  Location: [facility] ENDOSCOPY;  Service: Endoscopy;  Laterality: N/A;   ESOPHAGOGASTRODUODENOSCOPY (EGD) WITH PROPOFOL N/A 06/29/2022   Procedure: ESOPHAGOGASTRODUODENOSCOPY (EGD) WITH PROPOFOL;  Surgeon: Lin Landsman, MD;  Location: [facility] ENDOSCOPY;  Service: Gastroenterology;  Laterality: N/A;   GIVENS CAPSULE STUDY N/A 07/22/2022   Procedure: GIVENS CAPSULE STUDY;  Surgeon: Lin Landsman, MD;  Location: [facility] ENDOSCOPY;  Service: Gastroenterology;  Laterality: N/A;   MITRAL VALVE REPLACEMENT     MULTIPLE TOOTH EXTRACTIONS     TONSILLECTOMY     TOTAL KNEE ARTHROPLASTY Right 09/04/2016   Procedure: TOTAL KNEE ARTHROPLASTY; with lateral release;  Surgeon: Earlie Server, MD;  Location: [place];  Service: Orthopedics;  Laterality: Right;    Medications Prior to Admission  Medication Sig Dispense Refill Last Dose   albuterol (VENTOLIN HFA) 108 (90 Base) MCG/ACT inhaler Inhale 1-2 puffs into the lungs every 6 (six) hours as needed for wheezing or shortness of breath.   Unknown at PRN   allopurinol (ZYLOPRIM) 100 MG tablet Take 1 tablet (100 mg total) by mouth 2 (two) times daily. 60 tablet 5 Past Week at Unknown   Cholecalciferol (VITAMIN D) 50 MCG (2000 UT) CAPS Take 1 capsule (2,000 Units total) by mouth daily. 30 capsule 5    colchicine 0.6 MG tablet Take 1 tablet (0.6 mg total) by mouth daily. As directed 30 tablet 5 Unknown at PRN   docusate sodium (COLACE) 100 MG capsule Take 100 mg by mouth 2 (two) times daily as needed for mild constipation or moderate constipation.   Unknown at PRN   empagliflozin  (JARDIANCE) 10 MG TABS tablet Take 10 mg by mouth daily.   Past Week at Unknown   furosemide (LASIX) 40 MG tablet TAKE 2 TABLETS BY MOUTH IN THE MORNING AND 1 TABLET AT NIGHT (Patient taking differently: 40 mg 2 (two) times daily.) 90 tablet 5 Unknown at Unknown   gabapentin (NEURONTIN) 600 MG tablet Take 0.5 tablets (300 mg total) by mouth 2 (two) times daily. (Patient taking differently: Take 600 mg by mouth 3 (three) times daily.) 60 tablet 0 Past Week at Unknown   ipratropium-albuterol (DUONEB) 0.5-2.5 (3) MG/3ML SOLN INHALE THE CONTENTS OF 1 VIAL(3MLS) VIA NEBULIZER 3 TIMES DAILY AS NEEDED 360 mL 1    metolazone (ZAROXOLYN) 2.5 MG tablet Take 5 mg by mouth 3 (three) times a week.   Past Week at Unknown   metoprolol tartrate (LOPRESSOR) 100 MG tablet Take 1 tablet (100 mg total) by mouth 2 (two) times daily. 60 tablet 3 Past Week at Unknown   nitroGLYCERIN (NITROSTAT) 0.4 MG SL tablet Place 1 tablet (0.4 mg total) under the tongue every 5 (five) minutes as needed for chest pain. 30 tablet 12 Unknown at PRN   omeprazole (PRILOSEC) 40 MG capsule Take 40 mg by mouth daily.   Past  Week at Unknown   oxyCODONE ER 9 MG C12A Take 9 mg by mouth 2 (two) times daily.   Past Week at Unknown   potassium chloride (KLOR-CON) 10 MEQ tablet Take 4 tablets (40 mEq total) by mouth 2 (two) times daily. And additional 5mq on metolazone days (Patient taking differently: Take 40 mEq by mouth See admin instructions. Take 4 tablets (45m) by mouth twice daily - take an additional 2 tablets (2054m by mouth when taking metolazone) 64 tablet 5 Past Week at Unknown   promethazine (PHENERGAN) 12.5 MG tablet Take 1 tablet (12.5 mg total) by mouth every 8 (eight) hours as needed for nausea or vomiting. (Patient taking differently: Take 12.5 mg by mouth 2 (two) times daily as needed for nausea or vomiting.) 30 tablet 0 Unknown at PRN   rivaroxaban (XARELTO) 20 MG TABS tablet Take 20 mg by mouth daily with supper.   Past Week at  Unknown   rosuvastatin (CRESTOR) 10 MG tablet Take 10 mg by mouth daily.   Past Week at Unknown   Iron-FA-B Cmp-C-Biot-Probiotic (FUSION PLUS) CAPS Take 1 capsule by mouth daily. (Patient not taking: Reported on 09/16/2022) 30 capsule 3 Not Taking   Social History   Socioeconomic History   Marital status: Married    Spouse name: Not on file   Number of children: Not on file   Years of education: Not on file   Highest education level: Not on file  Occupational History   Occupation: disabled  Tobacco Use   Smoking status: Every Day    Packs/day: 0.10    Types: Cigarettes   Smokeless tobacco: Never   Tobacco comments:    4/20 was not able to quit.  Still at 1-3 a day. She would like to wait to set a new quit date until we get back to class to have the extra in person support  Vaping Use   Vaping Use: Never used  Substance and Sexual Activity   Alcohol use: Never    Alcohol/week: 3.0 standard drinks of alcohol    Types: 3 Cans of beer per week    Comment: 16 oz per week   Drug use: Not Currently    Comment: Previous use of cocaine and marijuana last use 07/31/16    Sexual activity: Not Currently  Other Topics Concern   Not on file  Social History Narrative   Not on file   Social Determinants of Health   Financial Resource Strain: Low Risk  (10/02/2019)   Overall Financial Resource Strain (CARDIA)    Difficulty of Paying Living Expenses: Not hard at all  Food Insecurity: No Food Insecurity (09/18/2022)   Hunger Vital Sign    Worried About Running Out of Food in the Last Year: Never true    Ran Out of Food in the Last Year: Never true  Transportation Needs: No Transportation Needs (09/18/2022)   PRAPARE - TraHydrologistedical): No    Lack of Transportation (Non-Medical): No  Physical Activity: Insufficiently Active (10/02/2019)   Exercise Vital Sign    Days of Exercise per Week: 7 days    Minutes of Exercise per Session: 20 min  Stress: No  Stress Concern Present (10/02/2019)   [place] Feeling of Stress : Not at all  Social Connections: Moderately Integrated (10/02/2019)   Social Connection and Isolation Panel [NHANES]    Frequency of Communication with Friends and Family: More  than three times a week    Frequency of Social Gatherings with Friends and Family: More than three times a week    Attends Religious Services: 1 to 4 times per year    Active Member of Clubs or Organizations: Yes    Attends Archivist Meetings: 1 to 4 times per year    Marital Status: Never married  Intimate Partner Violence: Not At Risk (09/18/2022)   Humiliation, Afraid, Rape, and Kick questionnaire    Fear of Current or Ex-Partner: No    Emotionally Abused: No    Physically Abused: No    Sexually Abused: No    Family History  Problem Relation Age of Onset   COPD Mother    Cancer Mother        Bone   Asthma Mother    Rheum arthritis Mother    Congestive Heart Failure Father    Breast cancer Maternal Aunt      Vitals:   09/28/22 0057 09/28/22 0434 09/28/22 0834 09/28/22 1347  BP: 112/66  103/77 106/76  Pulse: 82  89 93  Resp: '20  18 18  '$ Temp: 98.6 F (37 C)  97.7 F (36.5 C) 97.8 F (36.6 C)  TempSrc: Oral  Oral Oral  SpO2: 100%  100% 100%  Weight:  104.8 kg    Height:        PHYSICAL EXAM General: Chronically ill-appearing black female, persistent coughing throughout interview. HEENT:  Normocephalic and atraumatic. Neck:  + JVD.  Lungs: normal respiratory effort on O2 by [place]. Decreased breath sounds bilaterally without appreciable crackles or wheezes.   Heart: irregularly irregular rhythm with controlled rate. Grade 3/6 systolic rumbling murmur and soft S2. abdomen: Non-distended appearing with a large pannus.   Msk: generalized weakness, appears very deconditioned Extremities: No clubbing, cyanosis.  Trace left  lower extremity edema, warm to  touch.  Neuro: alert and oriented today, asking appropriate questions regarding how she is doing clinically Psych: Appropriate for situation.  Labs: Basic Metabolic Panel: Recent Labs    09/27/22 0551 09/28/22 0628  NA 139 137  K 3.6 3.7  CL 91* 90*  CO2 38* 37*  GLUCOSE 77 80  BUN 37* 37*  CREATININE 1.30* 1.26*  CALCIUM 8.6* 8.7*  MG 1.8 1.8  PHOS 2.7 3.0    Liver Function Tests: Recent Labs    09/28/22 0628  AST 50*  ALT 27  ALKPHOS 81  BILITOT 7.4*  PROT 6.3*  ALBUMIN 2.3*    No results for input(s): "LIPASE", "AMYLASE" in the last 72 hours. CBC: Recent Labs    09/27/22 0551 09/28/22 0628  WBC 7.5 8.7  HGB 9.3* 9.1*  HCT 33.1* 32.0*  MCV 81.9 83.1  PLT 112* 121*    Cardiac Enzymes: No results for input(s): "CKTOTAL", "CKMB", "CKMBINDEX", "TROPONINIHS" in the last 72 hours. BNP: No results for input(s): "BNP" in the last 72 hours.  D-Dimer: No results for input(s): "DDIMER" in the last 72 hours. Hemoglobin A1C: No results for input(s): "HGBA1C" in the last 72 hours. Fasting Lipid Panel: No results for input(s): "CHOL", "HDL", "LDLCALC", "TRIG", "CHOLHDL", "LDLDIRECT" in the last 72 hours. Thyroid Function Tests: No results for input(s): "TSH", "T4TOTAL", "T3FREE", "THYROIDAB" in the last 72 hours.  Invalid input(s): "FREET3" Anemia Panel: No results for input(s): "VITAMINB12", "FOLATE", "FERRITIN", "TIBC", "IRON", "RETICCTPCT" in the last 72 hours.   Radiology: [facility] Chest Port 1 View  Result Date: 09/25/2022 CLINICAL DATA:  Shortness of breath, renal failure. EXAM: PORTABLE  CHEST 1 VIEW COMPARISON:  Chest radiograph dated 09/16/2022. FINDINGS: The heart is enlarged. Mild-to-moderate bilateral interstitial and airspace opacities are redemonstrated. There is no pleural effusion or pneumothorax. Degenerative changes are seen in the spine. A mitral valve prosthesis and atrial appendage clip, and median sternotomy wires are redemonstrated. IMPRESSION:  Cardiomegaly with mild-to-moderate bilateral interstitial and airspace opacities, likely pulmonary edema. Electronically Signed   By: Zerita Boers M.D.   On: 09/25/2022 10:23   ECHOCARDIOGRAM LIMITED  Result Date: 09/23/2022    ECHOCARDIOGRAM LIMITED REPORT   Patient Name:   Chelsea Ramsey Date of Exam: 09/23/2022 Medical Rec #:  [id number]                 Height:       64.0 in Accession #:    [id number]                Weight:       243.4 lb Date of Birth:  05/21/1966                 BSA:          2.127 m Patient Age:    86 years                  BP:           109/71 mmHg Patient Gender: F                         HR:           96 bpm. Exam Location:  [facility] Procedure: Limited Echo Indications:     CHF I50.9  History:         Patient has prior history of Echocardiogram examinations, most                  recent 09/17/2022. CHF, Prior Cardiac Surgery, COPD; Risk                  Factors:Hypertension.                   Mitral Valve: bioprosthetic valve valve is present in the                  mitral position.  Sonographer:     Sherrie Sport Referring Phys:  [id number] [place] TANG Diagnosing Phys: Serafina Royals MD  Sonographer Comments: Technically difficult study due to poor echo windows, suboptimal apical window and suboptimal parasternal window. IMPRESSIONS  1. Left ventricular ejection fraction, by estimation, is 35 to 40%. The left ventricle has moderately decreased function. The left ventricle demonstrates global hypokinesis. The left ventricular internal cavity size was mildly dilated.  2. There is LV septal flattenting in systole and diastole and dilated hepatic vein consistant with severe phtn. Right ventricular systolic function is moderately reduced. The right ventricular size is moderately enlarged. Mildly increased right ventricular wall thickness. There is severely elevated pulmonary artery systolic pressure.  3. Left atrial size was severely dilated.  4. Right atrial size was severely  dilated.  5. Cannot rule out mild stenosis. doppler difficult to interpret. The mitral valve is degenerative. Mild mitral valve regurgitation. 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  6. The tricuspid valve is abnormal. Tricuspid valve regurgitation is severe.  7. The aortic valve is calcified. Aortic valve regurgitation is moderate. FINDINGS  Left Ventricle: Left ventricular ejection fraction, by estimation, is 35 to 40%.  The left ventricle has moderately decreased function. The left ventricle demonstrates global hypokinesis. The left ventricular internal cavity size was mildly dilated. Right Ventricle: There is LV septal flattenting in systole and diastole and dilated hepatic vein consistant with severe phtn. The right ventricular size is moderately enlarged. Mildly increased right ventricular wall thickness. Right ventricular systolic  function is moderately reduced. There is severely elevated pulmonary artery systolic pressure. Left Atrium: Left atrial size was severely dilated. Right Atrium: Right atrial size was severely dilated. Pericardium: There is no evidence of pericardial effusion. Mitral Valve: Cannot rule out mild stenosis. doppler difficult to interpret. The mitral valve is degenerative in appearance. Mild mitral valve regurgitation. 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 MV peak gradient, 19.2 mmHg. The mean mitral valve gradient is 11.0 mmHg. Tricuspid Valve: The tricuspid valve is abnormal. Tricuspid valve regurgitation is severe. Aortic Valve: The aortic valve is calcified. Aortic valve regurgitation is moderate. Pulmonic Valve: The pulmonic valve was normal in structure. Pulmonic valve regurgitation is mild. Aorta: The aortic root and ascending aorta are structurally normal, with no evidence of dilitation. IAS/Shunts: No atrial level shunt detected by color flow Doppler. LEFT VENTRICLE PLAX 2D LVIDd:         4.70 cm LVIDs:         3.10 cm LV PW:          0.90 cm LV IVS:        0.90 cm  RIGHT VENTRICLE RV Basal diam:  5.00 cm RV Mid diam:    6.50 cm LEFT ATRIUM         Index LA diam:    6.50 cm 3.06 cm/m   AORTA Ao Root diam: 2.90 cm MITRAL VALVE                TRICUSPID VALVE MV Area (PHT): 2.63 cm     TR Peak grad:   53.0 mmHg MV Peak grad:  19.2 mmHg    TR Vmax:        364.00 cm/s MV Mean grad:  11.0 mmHg MV Vmax:       2.19 m/s MV Vmean:      161.0 cm/s MV Decel Time: 288 msec MV E velocity: 239.00 cm/s Serafina Royals MD Electronically signed by Serafina Royals MD Signature Date/Time: 09/23/2022/4:02:55 PM    Final    ECHOCARDIOGRAM COMPLETE  Result Date: 09/17/2022    ECHOCARDIOGRAM REPORT   Patient Name:   Chelsea Ramsey Date of Exam: 09/17/2022 Medical Rec #:  [id number]                 Height:       64.0 in Accession #:    [id number]                Weight:       265.0 lb Date of Birth:  May 06, 1966                 BSA:          2.205 m Patient Age:    21 years                  BP:           93/79 mmHg Patient Gender: F                         HR:           110  bpm. Exam Location:  [facility] Procedure: 2D Echo, Color Doppler and Cardiac Doppler Indications:     I50.31 CHF-Acute Diastolic  History:         Patient has prior history of Echocardiogram examinations, most                  recent 11/05/2018. CHF, CAD, COPD; Risk Factors:Hypertension,                  Diabetes and Sleep Apnea.  Sonographer:     Charmayne Sheer Referring Phys:  [id number] [place] TANG Diagnosing Phys: Yolonda Kida MD  Sonographer Comments: Suboptimal apical window and no subcostal window. Image acquisition challenging due to patient body habitus and Image acquisition challenging due to COPD. IMPRESSIONS  1. Left ventricular ejection fraction, by estimation, is 20 to 25%. The left ventricle has severely decreased function. The left ventricle demonstrates global hypokinesis. Left ventricular diastolic parameters are consistent with Grade III diastolic dysfunction  (restrictive). There is the interventricular septum is flattened in systole and diastole, consistent with right ventricular pressure and volume overload.  2. Right ventricular systolic function is moderately reduced. The right ventricular size is moderately enlarged. Mildly increased right ventricular wall thickness.  3. Left atrial size was moderately dilated.  4. Right atrial size was severely dilated.  5. The mitral valve is abnormal. Mild to moderate mitral valve regurgitation.  6. Tricuspid valve regurgitation is severe.  7. The aortic valve is calcified. Aortic valve regurgitation is mild to moderate. Aortic valve sclerosis/calcification is present, without any evidence of aortic stenosis. Conclusion(s)/Recommendation(s): Poor windows for evaluation of left ventricular function by transthoracic echocardiography. Would recommend an alternative means of evaluation. FINDINGS  Left Ventricle: Left ventricular ejection fraction, by estimation, is 20 to 25%. The left ventricle has severely decreased function. The left ventricle demonstrates global hypokinesis. The left ventricular internal cavity size was normal in size. There is borderline concentric left ventricular hypertrophy. The interventricular septum is flattened in systole and diastole, consistent with right ventricular pressure and volume overload. Left ventricular diastolic parameters are consistent with Grade III diastolic dysfunction (restrictive). Right Ventricle: The right ventricular size is moderately enlarged. Mildly increased right ventricular wall thickness. Right ventricular systolic function is moderately reduced. Left Atrium: Left atrial size was moderately dilated. Right Atrium: Right atrial size was severely dilated. Pericardium: There is no evidence of pericardial effusion. Mitral Valve: The mitral valve is abnormal. Mild to moderate mitral valve regurgitation. Tricuspid Valve: The tricuspid valve is grossly normal. Tricuspid valve  regurgitation is severe. Aortic Valve: The aortic valve is calcified. Aortic valve regurgitation is mild to moderate. Aortic regurgitation PHT measures 233 msec. Aortic valve sclerosis/calcification is present, without any evidence of aortic stenosis. Aortic valve mean gradient measures 6.0 mmHg. Aortic valve peak gradient measures 11.3 mmHg. Aortic valve area, by VTI measures 1.14 cm. Pulmonic Valve: The pulmonic valve was normal in structure. Pulmonic valve regurgitation is mild to moderate. Aorta: The ascending aorta was not well visualized. IAS/Shunts: No atrial level shunt detected by color flow Doppler.  LEFT VENTRICLE PLAX 2D LVIDd:         3.50 cm LVIDs:         3.20 cm LV PW:         1.20 cm LV IVS:        0.90 cm LVOT diam:     1.80 cm LV SV:         25 LV SV Index:   12 LVOT Area:  2.54 cm  RIGHT VENTRICLE RV Basal diam:  5.30 cm RV Mid diam:    5.00 cm RV S prime:     7.62 cm/s LEFT ATRIUM         Index       RIGHT ATRIUM           Index LA diam:    5.50 cm 2.49 cm/m  RA Area:     28.50 cm                                 RA Volume:   104.00 ml 47.17 ml/m  AORTIC VALVE                     PULMONIC VALVE AV Area (Vmax):    0.87 cm      PV Vmax:          0.98 m/s AV Area (Vmean):   0.90 cm      PV Vmean:         69.200 cm/s AV Area (VTI):     1.14 cm      PV VTI:           0.128 m AV Vmax:           168.00 cm/s   PV Peak grad:     3.9 mmHg AV Vmean:          116.000 cm/s  PV Mean grad:     2.5 mmHg AV VTI:            0.223 m       PR End Diast Vel: 10.24 msec AV Peak Grad:      11.3 mmHg AV Mean Grad:      6.0 mmHg LVOT Vmax:         57.60 cm/s LVOT Vmean:        40.900 cm/s LVOT VTI:          0.100 m LVOT/AV VTI ratio: 0.45 AI PHT:            233 msec  AORTA Ao Root diam: 3.10 cm MITRAL VALVE               TRICUSPID VALVE MV Area (PHT): 5.95 cm    TR Peak grad:   55.7 mmHg MV Decel Time: 128 msec    TR Vmax:        373.00 cm/s MV E velocity: 87.60 cm/s                            SHUNTS                             Systemic VTI:  0.10 m                            Systemic Diam: 1.80 cm Yolonda Kida MD Electronically signed by Yolonda Kida MD Signature Date/Time: 09/17/2022/4:54:34 PM    Final    DG Chest 1 View  Result Date: 09/16/2022 CLINICAL DATA:  Worsening shortness of breath. 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 Cardiomegaly which has increased from 2021. Slight globular configuration of the heart, query  pericardial effusion. Suspect small right pleural effusion. Chronic bilateral lung opacities that appears stable. No acute airspace disease or pneumothorax. On limited assessment, no acute osseous findings. IMPRESSION: 1. Increased cardiomegaly from 2021. Slight globular configuration of the heart, query pericardial effusion. 2. Suspect small right pleural effusion. 3. Stable chronic bilateral lung opacities. Electronically Signed   By: Keith Rake M.D.   On: 09/16/2022 17:21   MR ABDOMEN MRCP WO CONTRAST  Result Date: 09/16/2022 CLINICAL DATA:  56 year old female with history of jaundice. Nausea. EXAM: MRI ABDOMEN WITHOUT CONTRAST  (INCLUDING MRCP) TECHNIQUE: Multiplanar multisequence MR imaging of the abdomen was performed. Heavily T2-weighted images of the biliary and pancreatic ducts were obtained, and three-dimensional MRCP images were rendered by post processing. COMPARISON:  No prior abdominal MRI or MRCP. Abdominal ultrasound 09/15/2022. FINDINGS: Comment: Portions of today's examination are substantially limited by patient respiratory motion. The study is also severely limited for detection and characterization of visceral and/or vascular lesions by lack of IV gadolinium. Lower chest: Severe cardiomegaly. Massively distended inferior vena cava. Hepatobiliary: No definite suspicious cystic or solid hepatic lesions are confidently  identified on today's motion limited noncontrast examination. No intra or extrahepatic biliary ductal dilatation noted on MRCP images. Common bile duct measures only 3 mm in the porta hepatis. No filling defect within the common bile duct to suggest choledocholithiasis. T1 hyperintense, T2 hypointense material lies dependently within the gallbladder. Gallbladder does not appear overly distended. Accurate assessment for gallbladder wall thickening is limited on today's examination secondary to motion, but there may be some very mild gallbladder wall thickening and edema. No overt pericholecystic fluid or surrounding inflammatory changes. Pancreas: No definite pancreatic mass or peripancreatic fluid collections or inflammatory changes noted on today's noncontrast examination. No pancreatic ductal dilatation. Spleen:  Unremarkable. Adrenals/Urinary Tract: Unenhanced appearance of the kidneys and bilateral adrenal glands is normal. No hydroureteronephrosis in the visualized portions of the abdomen. Stomach/Bowel: Visualized portions are unremarkable. Vascular/Lymphatic: No aneurysm identified in the visualized abdominal vasculature. No lymphadenopathy noted in the abdomen. Other: No significant volume of ascites noted in the visualized portions of the peritoneal cavity. Musculoskeletal: No aggressive appearing osseous lesions are noted in the visualized portions of the skeleton. IMPRESSION: 1. Biliary sludge lying dependently in the gallbladder. Gallbladder wall may be mildly edematous, but is poorly evaluated on today's motion limited examination. If there is any clinical concern for acute cholecystitis, further evaluation with right upper quadrant abdominal ultrasound or nuclear medicine hepatobiliary scan should be considered at this time. 2. No biliary tract dilatation to suggest obstruction. No evidence of choledocholithiasis. 3. Severe cardiomegaly. Electronically Signed   By: Vinnie Langton M.D.   On:  09/16/2022 05:28   US ABDOMEN LIMITED RUQ (LIVER/GB)  Result Date: 09/15/2022 CLINICAL DATA:  Elevated bilirubin EXAM: ULTRASOUND ABDOMEN LIMITED RIGHT UPPER QUADRANT COMPARISON:  CT abdomen and pelvis 12/01/2018 FINDINGS: Gallbladder: Sludge is present within the gallbladder. Small amount of pericholecystic fluid. The gallbladder wall is mildly thickened measuring 5 mm. Sonographic Murphy's sign was positive. Common bile duct: Diameter: 3 mm Liver: No focal lesion identified. Increased echogenicity. Portal vein is patent on Doppler imaging with bidirectional flow. Other: None. IMPRESSION: 1. Findings compatible with acute cholecystitis. 2. 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 Electronically Signed   By: Placido Sou M.D.   On: 09/15/2022 23:07    L/RHC 05/13/2018 Date of Procedure: 05/06/18  ___________________________________________________________________________  _  Findings:  1. No angiographically apparent coronary artery disease.  2. Elevated pulmonary capillary (23 mmHg)  3. Preserved cardiac output (6.2) and index (2.8)   Recommendations:  1. Intensive secondary prevention.  2. Follow up with primary cardiologist.   Complications:   None  ___________________________________________________________________________  _  Referring Physician: Vella Raring, MD  Primary Cardiologist: Vella Raring, MD  Primary Care Physician: Kathee Delton   Performing Attending: Nena Polio, MD  Diagnostic Fellow: Lorrene Reid, MD  Interventional Fellow: NA   Procedures performed: Right Heart Catheterization and Coronary angiography  Access Site: Right Radial Artery  Arterial Closure: TR Band   Contrast Volume: 30 mL  Fluoroscopy Time: 6.3 mins  Radiation Dose: 372 mGy  ___________________________________________________________________________  _  HISTORY   56 yo female with history of HTN, moderate to severe  MR and mild MS  (likely secondary to RHD), afib on [facility] with Xarelto who is scheduled for  surgical MVR on 05/30/18 and has been referred for [facility] for pre-operative  planning.     FINDINGS   Right Heart Catheterization   RA mean: 11 mmHg  RV: 63/1 (11) mmHg  PA (mean): 64/29 (44) mmHg  PCWP mean: 23 mmHg  PA Sat: 60 %  Art Sat: 95 %  Fick Cardiac output: 6.2 L/min  Fick Cardiac index: 2.8 L/min/m2   Hemodynamics and Coronary Angiography:    Aortic pressure: 104/73 mmHg   Coronary Angiography   Dominance: Right   Left main (LMCA): LMCA is a large-caliber vessel that originates from  the left coronary sinus. It bifurcates into the left anterior descending  (LAD) and left circumflex (LCx) arteries. There is no angiographic  evidence of significant disease in the LMCA.   Left anterior descending (LAD): The LAD is a large-caliber vessel that  gives off two diagonal (D) branches before it wraps around the apex. D1 is  a moderate caliber vessel with a high take-off. D2 is a small caliber  vessel. There is no angiographic evidence of significant disease in the  LAD.   Circumflex (LCx): The LCx is a medium-caliber vessel that gives off two  obtuse marginal (OM) branches and then continues as a small vessel in the  AV groove. OM1 is a small caliber vessel. OM2 is a moderate caliber  branching vessel. There is no angiographic evidence of significant disease  in the LCx.   Right Coronary (RCA): The RCA is a large-caliber vessel originating from  the right coronary sinus. It bifurcates distally into the posterior  descending artery (PDA) and three posterolateral branches (PL) consistent  with a right dominant system. There is no angiographic evidence of  significant disease in the RCA.   TELEMETRY reviewed by me (LT) 09/28/2022 : Atrial fibrillation versus flutter rates 80-90s  EKG reviewed by me: Atrial flutter with 4 1 conduction rate 84, QTc 470 on 10/23  Data reviewed by  me (LT) 09/28/2022:  hospitalist progress note, PT notes, nursing notes, CBC, CMP, INR, BNP, vitals, telemetry, I/O, ID note  Principal Problem:   Acute on chronic HFrEF (heart failure with reduced ejection fraction) (HCC) Active Problems:   A-fib (HCC)   HTN (hypertension)   Chronic low back pain (Primary Area of Pain) (Bilateral) w/ sciatica (Left)   Class 3 severe obesity with body mass index (BMI) of 45.0 to 49.9 in adult (HCC)   Moderate to severe pulmonary hypertension (HCC)   Tobacco dependence   History of cocaine use   Iron deficiency anemia   Abdominal pain   AKI (acute kidney injury) ([place])  Elevated LFTs   Hypokalemia   Hypomagnesemia   Acute metabolic encephalopathy   Epistaxis    ASSESSMENT AND PLAN:  Channel C. 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  Labs on admission showed acute renal failure (improved with diuresis), transaminitis, and hyperbilirubinemia, ultimately attributed to volume overload from her heart failure exacerbation.  Initial echo on 10/19 revealed a newly reduced LVEF with moderately reduced RV function, and severe TR. Hospital course complicated by atrial fibrillation with RVR and persistently elevated T. bili.  Ongoing primary complaint is generalized abdominal pain. Repeat limited echo on 10/25 after aggressive diuresis showed persistent dilation of hepatic vein, moderate RV enlargement, severely elevated PASP, and severe TR, c/w severe pulmonary hypertension.  #Positive QuantiFERON gold #Cough, hemoptysis -Cough and hemoptysis  started overnight on 10/26, leukocytosis to 13,700 on 10/26, improved. She has been afebrile - seen by ID, thinks hemoptysis could be from pulmonary HTN. - sputum culture pending.   #severe pulmonary hypertension #RV dysfunction Seen on repeat echo 10/25 with severe TR, moderately reduced RV function, severely elevated PASP. [place] in 2019 showed mildly elevated PA pressure 23 mmHg.  She was last seen by pulmonology in 04/2020 for her COPD/asthma but does not seem to be on therapy specific for pulmonary hypertension. Per chart review she had not been feeling well (abdominal pain, weakness) since the end of August after her upper endoscopy.  -We will continue diuresis as below -recommend outpatient follow up with pulmonology (established with Dr. Lanney Gins)  #Acute on chronic HFrEF (EF 35-40%, global hypokinesis and G3 DD, RV dilation and dysfunction, mild to moderate MR, AR) #Rheumatic mitral valve stenosis s/p MVR 2019 #Abdominal pain BNP on admission was elevated at 558.5 and uptrending to 1800 on 10/25 despite diuresis. Echo 10/19 shows significant reduction in EF 20-25% while in AF RVR from >55% 06/2021 and new AR and MR compared to prior study. Repeat on 10/25 while rate was better controlled and after diuresis was 35-40%. Her abdominal pain was ultimately attributed to volume overload/hepatic insufficiency after acute cholecystitis, choledocholithiasis, and a hematologic disorder were ruled out by general surgery, GI, and hematology.  Her volume status has much improved with aggressive diuresis, and feels "awesome" on 10/30.  Appears euvolemic on exam today.   On 10/30 she was able to tell me that she stopped taking most of her medications in the several weeks preceding her admission and was drinking lots of soda and not eating a lot of food which she attributes to her depression.  She says she might of missed a couple doses of Lasix but otherwise admits to compliance with her diuretics despite not  taking her other medications.  -S/p IV Lasix 40 mg BID x 8 days.  -agree with IV Lasix 40 mg daily.  -S/p metolazone 5 mg x 1 10/24, 2.5x1 on 10/26.   -will schedule metolazone 2.'5mg'$  PO on MWF  -Continue spironolactone 12.5 mg once daily -Continue potassium replacement 40 mEq twice daily -Hold off on Jardiance for now, consider restarting on an outpatient basis. -Home diuretics are Lasix 40 p.o. once  daily, metolazone 5 mg 3 times weekly  #Paroxysmal atrial fibrillation with RVR, now in atrial flutter Was previously managed well on metoprolol tartrate 100 mg twice daily.  Tachyarrhythmia on admission worsening her RV dysfunction. -S/p sotalol 80 mg once daily (started on 10/20) due to QTc prolongation (401 on 10/21, 470 on 10/23).  Not a great option with aggressive diuresis, persistent hypokalemia. -Intolerant of amiodarone (N/V), digoxin not initiated on admission due to acute renal failure -consolidate metoprolol tartrate to '50mg'$  BID (home dose 100 mg twice daily)  -Discontinue diltiazem due to reduced EF and hypotension -She was on a heparin infusion on admission, has been switched back to her Xarelto, 20 mg okay per her total body weight and creatinine clearance per pharmacy on 10/23. -Xarelto held with hemoptysis/epistaxis overnight on 10/23 was given once on 10/26, but switched back to heparin on 10/27 due to hemoptsis.   -Discussed with Dr. Clayborn Bigness who recommends dose reduced Xarelto at '15mg'$  at discharge (d/t hepatic dysfunction)  -Continue rate control strategy, consider DCCV on an outpatient basis after 3 weeks of appropriate anticoagulation -Monitor and replete electrolytes to maintain a K >4, mag >2  #Hyperbilirubinemia s/p MRCP #Labile and elevated INR #Acute encephalopathy, resolved -t bili has remained elevated since admission, finally improving from 9.0 to 7.4 today.  -ammonia improved from 135 with lactulose to 49 on 10/25. INR improved after vitamin K.   -Encephalopathy  felt to be related to opioid analgesics, which have now been discontinued. -Appreciate GI input.  #AKI Presented with a BUN/creatinine of 50/3.52 and GFR 15, much improved after metolazone x1 on 10/24  -Current renal function BUN/creatinine 33/1.27 and GFR 50 -Daily CMP  This patient's plan of care was discussed and created with Dr. Clayborn Bigness and he is in agreement.  Signed: Tristan Schroeder , PA-C 09/28/2022, 3:08 PM [facility]

## 2022-09-29 DIAGNOSIS — I5023 Acute on chronic systolic (congestive) heart failure: Secondary | ICD-10-CM | POA: Diagnosis not present

## 2022-09-29 DIAGNOSIS — R1010 Upper abdominal pain, unspecified: Secondary | ICD-10-CM | POA: Diagnosis not present

## 2022-09-29 DIAGNOSIS — R040 Epistaxis: Secondary | ICD-10-CM | POA: Diagnosis not present

## 2022-09-29 DIAGNOSIS — R7989 Other specified abnormal findings of blood chemistry: Secondary | ICD-10-CM | POA: Diagnosis not present

## 2022-09-29 LAB — CBC
HCT: 32.4 % — ABNORMAL LOW (ref 36.0–46.0)
Hemoglobin: 9.3 g/dL — ABNORMAL LOW (ref 12.0–15.0)
MCH: 23.7 pg — ABNORMAL LOW (ref 26.0–34.0)
MCHC: 28.7 g/dL — ABNORMAL LOW (ref 30.0–36.0)
MCV: 82.4 fL (ref 80.0–100.0)
Platelets: 124 10*3/uL — ABNORMAL LOW (ref 150–400)
RBC: 3.93 MIL/uL (ref 3.87–5.11)
RDW: 24.5 % — ABNORMAL HIGH (ref 11.5–15.5)
WBC: 9.1 10*3/uL (ref 4.0–10.5)
nRBC: 0.3 % — ABNORMAL HIGH (ref 0.0–0.2)

## 2022-09-29 LAB — COMPREHENSIVE METABOLIC PANEL
ALT: 30 U/L (ref 0–44)
AST: 58 U/L — ABNORMAL HIGH (ref 15–41)
Albumin: 2.4 g/dL — ABNORMAL LOW (ref 3.5–5.0)
Alkaline Phosphatase: 86 U/L (ref 38–126)
Anion gap: 10 (ref 5–15)
BUN: 35 mg/dL — ABNORMAL HIGH (ref 6–20)
CO2: 39 mmol/L — ABNORMAL HIGH (ref 22–32)
Calcium: 8.7 mg/dL — ABNORMAL LOW (ref 8.9–10.3)
Chloride: 89 mmol/L — ABNORMAL LOW (ref 98–111)
Creatinine, Ser: 1.23 mg/dL — ABNORMAL HIGH (ref 0.44–1.00)
GFR, Estimated: 52 mL/min — ABNORMAL LOW (ref >60)
Glucose, Bld: 78 mg/dL (ref 70–99)
Potassium: 3.3 mmol/L — ABNORMAL LOW (ref 3.5–5.1)
Sodium: 138 mmol/L (ref 135–145)
Total Bilirubin: 7.6 mg/dL — ABNORMAL HIGH (ref 0.3–1.2)
Total Protein: 6.7 g/dL (ref 6.5–8.1)

## 2022-09-29 LAB — PROTIME-INR
INR: 1.4 — ABNORMAL HIGH (ref 0.8–1.2)
Prothrombin Time: 16.7 seconds — ABNORMAL HIGH (ref 11.4–15.2)

## 2022-09-29 LAB — HEPARIN LEVEL (UNFRACTIONATED): Heparin Unfractionated: 0.26 IU/mL — ABNORMAL LOW (ref 0.30–0.70)

## 2022-09-29 MED ORDER — RIVAROXABAN 15 MG PO TABS
15.0000 mg | ORAL_TABLET | Freq: Every day | ORAL | Status: DC
  Administered 2022-09-29 – 2022-10-01 (×3): 15 mg via ORAL
  Filled 2022-09-29 (×3): qty 1

## 2022-09-29 MED ORDER — FUROSEMIDE 40 MG PO TABS
60.0000 mg | ORAL_TABLET | Freq: Every day | ORAL | Status: DC
  Administered 2022-09-30 – 2022-10-01 (×2): 60 mg via ORAL
  Filled 2022-09-29 (×2): qty 1

## 2022-09-29 MED ORDER — MIDODRINE HCL 5 MG PO TABS
5.0000 mg | ORAL_TABLET | Freq: Three times a day (TID) | ORAL | Status: DC
  Administered 2022-09-29 – 2022-10-01 (×7): 5 mg via ORAL
  Filled 2022-09-29 (×6): qty 1

## 2022-09-29 MED ORDER — POTASSIUM CHLORIDE CRYS ER 20 MEQ PO TBCR
40.0000 meq | EXTENDED_RELEASE_TABLET | Freq: Once | ORAL | Status: AC
  Administered 2022-09-29: 40 meq via ORAL
  Filled 2022-09-29: qty 2

## 2022-09-29 MED ORDER — PANTOPRAZOLE SODIUM 40 MG PO TBEC
40.0000 mg | DELAYED_RELEASE_TABLET | Freq: Two times a day (BID) | ORAL | Status: DC
  Administered 2022-09-29 – 2022-10-01 (×4): 40 mg via ORAL
  Filled 2022-09-29 (×4): qty 1

## 2022-09-29 MED ORDER — FUROSEMIDE 40 MG PO TABS: 40.0000 mg | ORAL_TABLET | Freq: Every day | ORAL | Status: DC

## 2022-09-29 NOTE — Consult Note (Signed)
ANTICOAGULATION CONSULT NOTE   Pharmacy Consult for Heparin infusion Indication: atrial fibrillation  Allergies  Allergen Reactions   Amiodarone Nausea And Vomiting   Aspirin Swelling   Flexeril [Cyclobenzaprine] Swelling   Trazamine [Trazodone & Diet Manage Prod] Nausea And Vomiting   Codeine Rash   Tramadol Rash    Patient Measurements: Height: '5\' 4"'$  (162.6 cm) Weight: 104.8 kg (231 lb 0.7 oz) IBW/kg (Calculated) : 54.7 Heparin Dosing Weight: 83.9 kg  Vital Signs: Temp: 98.6 F (37 C) (10/31 0414) Temp Source: Oral (10/31 0414) BP: 106/70 (10/31 0414) Pulse Rate: 87 (10/31 0414)  Labs: Recent Labs    09/26/22 0912 09/27/22 0551 09/27/22 0551 09/27/22 1357 09/27/22 2114 09/28/22 0628 09/28/22 1648 09/28/22 2319 09/29/22 0344  HGB  --  9.3*   < >  --   --  9.1*  --   --  9.3*  HCT  --  33.1*  --   --   --  32.0*  --   --  32.4*  PLT  --  112*  --   --   --  121*  --   --  124*  APTT 86* 105*  --  44*  --   --   --   --   --   LABPROT  --  17.7*  --   --   --   --   --   --  16.7*  INR  --  1.5*  --   --   --   --   --   --  1.4*  HEPARINUNFRC  --  0.39   < > 0.11*   < > 0.28* 0.31 0.37 0.26*  CREATININE  --  1.30*  --   --   --  1.26*  --   --  1.23*   < > = values in this interval not displayed.     Estimated Creatinine Clearance: 60.9 mL/min (A) (by C-G formula based on SCr of 1.23 mg/dL (H)).   Medical History: Past Medical History:  Diagnosis Date   Acute drug-induced gout of left foot 03/01/2018   Last Assessment & Plan:  Likely at least partially brought on by diuresis.  Needs to continue to diurese  Will push hydration Stop allopurinol given initiation during acute flare may worsen this, re-broach this when asymptomatic Avoid nsaids given stomach pain Trial colchicine Add acetaminophen Ice, elevate, rest   Allergy    seasonal   Anxiety    Arthritis    Right Knee   Asthma    CHF (congestive heart failure) (HCC)    COPD (chronic obstructive  pulmonary disease) (HCC)    Coronary artery disease    Leaky heart valve   Diabetes mellitus without complication (HCC)    Dysrhythmia    Fibromyalgia    GERD (gastroesophageal reflux disease)    Hypertension    Pneumonia    PUD (peptic ulcer disease)    Pulmonary HTN (HCC)    Rheumatic fever/heart disease    Sleep apnea     Medications:  Scheduled:   (feeding supplement) PROSource Plus  30 mL Oral TID BM   acidophilus  1 capsule Oral Daily   allopurinol  100 mg Oral Daily   dextromethorphan-guaiFENesin  1 tablet Oral BID   docusate sodium  100 mg Oral BID   feeding supplement  237 mL Oral TID BM   furosemide  40 mg Intravenous Daily   gabapentin  300 mg Oral BID   lactulose  30 g Oral BID   lactulose  300 mL Rectal BID   metolazone  2.5 mg Oral Once per day on Mon Wed Fri   metoprolol tartrate  50 mg Oral BID   midodrine  10 mg Oral TID WC   multivitamin with minerals  1 tablet Oral Daily   pantoprazole (PROTONIX) IV  40 mg Intravenous Q12H   rifaximin  550 mg Oral BID   sodium chloride flush  3 mL Intravenous Q12H   sodium chloride flush  3 mL Intravenous Q12H   spironolactone  12.5 mg Oral Daily   Infusions:   sodium chloride     heparin 1,400 Units/hr (09/28/22 1428)   PRN: sodium chloride, acetaminophen **OR** acetaminophen, albuterol, HYDROcodone bit-homatropine, metoprolol tartrate, nitroGLYCERIN, prochlorperazine, sodium chloride flush, traZODone  Assessment: Chelsea Ramsey is a 56 y.o. female presenting with acute on chronic HF. PMH significant for DM, pulm HTN, COPD, CHF, GERD, OSA, Afib on Xarelto. Patient was on [facility] PTA per chart review, with last dose Xarelto 20 mg 10/26 @ 1905. Patient found to have potential hepatic dysfunction so PTA Xarelto being held at this time. Unclear etiology of liver dysfunction, continue to follow diagnosis/etiology for oral AC options at discharge. Pharmacy has been consulted to initiate and manage heparin infusion.    Baseline Labs 10/27: aPTT 40, HL >1.1, PT 31.0, INR 3.0, Hgb 9.6, Hct 34.6, Plt 132   Date Time HL aPTT Comment  10/28 0314  0.82 86 aPTT therapeutic x1 10/29 0551 0.39  105 aPTT SUPRAtherapeutic 10/29 1357 0.11 44  correlation confirmed, aPTT & HL SUBtherapeutic  10/29 2114 0.22 --- SUBtherapeutic 10/30 0628 0.28 --- SUBtherapeutic 10/30  1648     0.31 --- Therapeutic x1 10/30 2319 0.37 --- Therapeutic x2 10/31 0344 0.26 --- SUBtherapeutic  Goal of Therapy:  HL 0.3-0.5 due to bleeding risk (correlated with aPTT goal 66-85 sec) Monitor platelets by anticoagulation protocol: Yes   Plan:  Increase heparin infusion rate to 1500 units/hr No boluses due to history of hemoptysis Check HL in 6 hours and daily while on heparin Continue to monitor H&H and platelets daily while on heparin  Thank you for allowing pharmacy to be a part of this patient's care.  Gretel Acre, PharmD PGY1 Pharmacy Resident 09/29/2022 7:02 AM

## 2022-09-29 NOTE — Progress Notes (Signed)
Physical Therapy Treatment Patient Details Name: Chelsea Ramsey MRN: [id number] DOB: June 16, 1966 Today's Date: 09/29/2022   History of Present Illness Pt is a 56 yo female seen by PCP secondary to not feeling well with abd pain for about a month. MD assessment includes: Right sided abdominal pain, transaminitis, hyperbilirubinemia, AKI, and iron deficiency anemia.    PT Comments    Patient was agreeable to PT session. She had a large loose stool in the bed and was agreeable to get up to the bed side commode. +2 person assistance required for transfer from bed to bed side commode and for standing from the commode. She ambulated 77f with rolling walker with minimal assistance for steadying. Increased standing tolerance this session, however activity tolerance is still limited by fatigue and generalized weakness. Recommend to continue PT to maximize independence and facilitate return to prior level of function. She will need family assistance to mobilize if going home.    Recommendations for follow up therapy are one component of a multi-disciplinary discharge planning process, led by the attending physician.  Recommendations may be updated based on patient status, additional functional criteria and insurance authorization.  Follow Up Recommendations  Skilled nursing-short term rehab (<3 hours/day) Can patient physically be transported by private vehicle: No   Assistance Recommended at Discharge Frequent or constant Supervision/Assistance  Patient can return home with the following Two people to help with walking and/or transfers;Two people to help with bathing/dressing/bathroom;Assistance with cooking/housework;Help with stairs or ramp for entrance;Assist for transportation   Equipment Recommendations  Hospital bed;Wheelchair (measurements PT);BSC/3in1    Recommendations for Other Services       Precautions / Restrictions Precautions Precautions: Fall Restrictions Weight  Bearing Restrictions: No     Mobility  Bed Mobility Overal bed mobility: Needs Assistance Bed Mobility: Supine to Sit, Sit to Supine     Supine to sit: Mod assist, +2 for physical assistance     General bed mobility comments: assistance for LE and trunk support. verbal cues for technique    Transfers Overall transfer level: Needs assistance Equipment used: Rolling walker (2 wheels) Transfers: Sit to/from Stand Sit to Stand: Mod assist, +2 physical assistance   Step pivot transfers: Mod assist, +2 physical assistance       General transfer comment: lifting assistance required for standing. verbal cues for technique to complete transfers    Ambulation/Gait Ambulation/Gait assistance: Min assist Gait Distance (Feet): 6 Feet Assistive device: Rolling walker (2 wheels) Gait Pattern/deviations: Step-to pattern Gait velocity: decreased     General Gait Details: very slow cadence with steadying assistance provided. activity tolerance limited by fatigue   Stairs             Wheelchair Mobility    Modified Rankin (Stroke Patients Only)       Balance Overall balance assessment: Needs assistance Sitting-balance support: Feet supported Sitting balance-Leahy Scale: Fair     Standing balance support: Bilateral upper extremity supported Standing balance-Leahy Scale: Fair Standing balance comment: Min guard for safety. standing tolerance of at least 3 minutes today                            Cognition Arousal/Alertness: Awake/alert Behavior During Therapy: Flat affect Overall Cognitive Status: No family/caregiver present to determine baseline cognitive functioning  General Comments: patient able to follow single step commands with increased time        Exercises      General Comments        Pertinent Vitals/Pain Pain Assessment Pain Assessment: No/denies pain    Home Living                           Prior Function            PT Goals (current goals can now be found in the care plan section) Acute Rehab PT Goals Patient Stated Goal: to go home PT Goal Formulation: With patient Time For Goal Achievement: 09/30/22 Potential to Achieve Goals: Fair Progress towards PT goals: Progressing toward goals    Frequency    Min 2X/week      PT Plan Current plan remains appropriate    Co-evaluation PT/OT/SLP Co-Evaluation/Treatment: Yes Reason for Co-Treatment: To address functional/ADL transfers PT goals addressed during session: Mobility/safety with mobility OT goals addressed during session: ADL's and self-care      AM-PAC PT "6 Clicks" Mobility   Outcome Measure  Help needed turning from your back to your side while in a flat bed without using bedrails?: A Lot Help needed moving from lying on your back to sitting on the side of a flat bed without using bedrails?: Total Help needed moving to and from a bed to a chair (including a wheelchair)?: A Lot Help needed standing up from a chair using your arms (e.g., wheelchair or bedside chair)?: A Lot Help needed to walk in hospital room?: Total Help needed climbing 3-5 steps with a railing? : Total 6 Click Score: 9    End of Session Equipment Utilized During Treatment: Oxygen Activity Tolerance: Patient tolerated treatment well Patient left: in chair;with call bell/phone within reach;with chair alarm set Nurse Communication: Mobility status PT Visit Diagnosis: Muscle weakness (generalized) (M62.81);Difficulty in walking, not elsewhere classified (R26.2);Unsteadiness on feet (R26.81)     Time: 1430-1505 PT Time Calculation (min) (ACUTE ONLY): 35 min  Charges:  $Therapeutic Activity: 8-22 mins                    Chelsea Ramsey, PT, MPT    Chelsea Ramsey 09/29/2022, 3:27 PM

## 2022-09-29 NOTE — Plan of Care (Signed)
  Problem: Education: Goal: Knowledge of General Education information will improve Description: Including pain rating scale, medication(s)/side effects and non-pharmacologic comfort measures Outcome: Progressing   Problem: Activity: Goal: Risk for activity intolerance will decrease Outcome: Progressing   Problem: Coping: Goal: Level of anxiety will decrease Outcome: Progressing   Problem: Elimination: Goal: Will not experience complications related to bowel motility Outcome: Progressing Goal: Will not experience complications related to urinary retention Outcome: Progressing   Problem: Pain Managment: Goal: General experience of comfort will improve Outcome: Progressing   

## 2022-09-29 NOTE — Consult Note (Signed)
   Heart Failure Nurse Navigator Note   HFrEF 35 to 40%.  Ventricular internal cavity is mildly dilated.  Flattening noted of LV septum.  Right ventricular systolic function is moderately reduced.  Right ventricular size is moderately enlarged.  Severely elevated pulmonary artery systolic pressures.  Severe biatrial enlargement.  Mild mitral regurgitation.  Tricuspid regurgitation is severe.  Aortic regurgitation is moderate.  She presented to the emergency room after she was advised that she come to the ED due to abnormal lab work.  Complained of nausea and worsening abdominal discomfort.  Comorbidities:  Arthritis Anxiety Asthma COPD Diabetes Fibromyalgia GERD Pulmonary hypertension Gout Mitral valve replacement Obesity  Medications:  Halazone 2.5 mg Monday Wednesday Friday Metoprolol tartrate 50 mg 2 times a day Midodrine to 5 mg 3 times with meals Xarelto 15 mg daily Spironolactone 12 and half milligrams daily  Scheduled to start furosemide 60 mg daily  Labs:  Sodium 138, potassium 3.3, chloride 89, CO2 39, BUN 35, creatinine 1.23, albumin 2.4, AST 58, ALT 30, bilirubin total 7.6, estimated GFR 52 Weight today not documented Intake 360 mL Output 800 mL Blood pressure 111/75  Initial meeting with patient on this admission, she is awake and alert sitting up in the bed having just finished taking her medications and working on her breakfast tray.  She states that she is familiar with the term heart failure and has followed with Ms. Hackney in the outpatient heart failure clinic.  She states that the doctors have told her that her that her function has weakened.  States that she lives at home with her husband.  States that she does not have a scale for weighing herself.  Went over the importance of daily weights, what to report, fluid and sodium restriction along with abstaining from salt.  She voices understanding.  She was given the living with heart failure  teaching booklet, zone magnet, info on low-sodium and heart failure along with weight chart.  I will continue to follow as long as she is an inpatient.  She had no further questions at this time.  Pricilla Riffle RN [facility]

## 2022-09-29 NOTE — Progress Notes (Signed)
Occupational Therapy Treatment Patient Details Name: Chelsea Ramsey MRN: [id number] DOB: 09/07/1966 Today's Date: 09/29/2022   History of present illness Pt is a 56 yo female seen by PCP secondary to not feeling well with abd pain for about a month. MD assessment includes: Right sided abdominal pain, transaminitis, hyperbilirubinemia, AKI, and iron deficiency anemia.   OT comments  Chelsea Ramsey was seen for OT/PT co-treatment on this date. Upon arrival to room pt reclined in bed, found to have loose BM in bed, agreeable to [facility]. Pt requires MOD A x2 exit bed.  MOD A x2 + RW for BSC t/f, MAX A pericare in standing, tolerates ~5 min static standing. Pt making good progress toward goals, will continue to follow POC. Discharge recommendation remains appropriate.     Recommendations for follow up therapy are one component of a multi-disciplinary discharge planning process, led by the attending physician.  Recommendations may be updated based on patient status, additional functional criteria and insurance authorization.    Follow Up Recommendations  Skilled nursing-short term rehab (<3 hours/day)    Assistance Recommended at Discharge Frequent or constant Supervision/Assistance  Patient can return home with the following  A lot of help with walking and/or transfers;A lot of help with bathing/dressing/bathroom;Help with stairs or ramp for entrance;Assist for transportation;Direct supervision/assist for medications management;Direct supervision/assist for financial management;Assistance with cooking/housework   Equipment Recommendations  Hospital bed    Recommendations for Other Services      Precautions / Restrictions Precautions Precautions: Fall Restrictions Weight Bearing Restrictions: No       Mobility Bed Mobility Overal bed mobility: Needs Assistance Bed Mobility: Supine to Sit, Sit to Supine     Supine to sit: Mod assist, +2 for physical assistance     General bed  mobility comments: assistance for LE and trunk support. verbal cues for technique    Transfers Overall transfer level: Needs assistance Equipment used: Rolling walker (2 wheels) Transfers: Sit to/from Stand Sit to Stand: Mod assist, +2 physical assistance     Step pivot transfers: Mod assist, +2 physical assistance           Balance Overall balance assessment: Needs assistance Sitting-balance support: Feet supported Sitting balance-Leahy Scale: Fair     Standing balance support: Bilateral upper extremity supported Standing balance-Leahy Scale: Fair Standing balance comment: Min guard for safety. standing tolerance of at least 3 minutes today                           ADL either performed or assessed with clinical judgement   ADL Overall ADL's : Needs assistance/impaired                                       General ADL Comments: MOD A x2 + RW for BSC t/f, MAX A pericare in standing, tolerates ~5 min static standing.      Cognition Arousal/Alertness: Awake/alert Behavior During Therapy: Flat affect Overall Cognitive Status: No family/caregiver present to determine baseline cognitive functioning                                 General Comments: patient able to follow single step commands with increased time                   Pertinent Vitals/ Pain  Pain Assessment Pain Assessment: No/denies pain   Frequency  Min 2X/week        Progress Toward Goals  OT Goals(current goals can now be found in the care plan section)  Progress towards OT goals: Progressing toward goals  Acute Rehab OT Goals Patient Stated Goal: to improve diarheea OT Goal Formulation: With patient Time For Goal Achievement: 10/02/22 Potential to Achieve Goals: Fair ADL Goals Pt Will Perform Grooming: with supervision;standing Pt Will Transfer to Toilet: with supervision;ambulating Pt Will Perform Toileting - Clothing Manipulation and  hygiene: with supervision;sit to/from stand Pt/caregiver will Perform Home Exercise Program: Increased strength;Both right and left upper extremity;With written HEP provided;With Supervision;With theraband  Plan Discharge plan remains appropriate;Frequency remains appropriate    Co-evaluation    PT/OT/SLP Co-Evaluation/Treatment: Yes Reason for Co-Treatment: For patient/therapist safety;To address functional/ADL transfers PT goals addressed during session: Mobility/safety with mobility OT goals addressed during session: ADL's and self-care      AM-PAC OT "6 Clicks" Daily Activity     Outcome Measure   Help from another person eating meals?: None Help from another person taking care of personal grooming?: A Little Help from another person toileting, which includes using toliet, bedpan, or urinal?: A Lot Help from another person bathing (including washing, rinsing, drying)?: A Lot Help from another person to put on and taking off regular upper body clothing?: A Little Help from another person to put on and taking off regular lower body clothing?: A Lot 6 Click Score: 16    End of Session Equipment Utilized During Treatment: Oxygen  OT Visit Diagnosis: Unsteadiness on feet (R26.81);Repeated falls (R29.6);Muscle weakness (generalized) (M62.81)   Activity Tolerance Patient tolerated treatment well   Patient Left with call bell/phone within reach;in chair;with chair alarm set   Nurse Communication          Time: 4008-6761 OT Time Calculation (min): 27 min  Charges: OT General Charges $OT Visit: 1 Visit OT Treatments $Self Care/Home Management : 8-22 mins  Dessie Coma, M.S. OTR/L  09/29/22, 4:12 PM  ascom [phone]

## 2022-09-29 NOTE — Consult Note (Signed)
ANTICOAGULATION CONSULT NOTE   Pharmacy Consult for Heparin infusion Indication: atrial fibrillation  Allergies  Allergen Reactions   Amiodarone Nausea And Vomiting   Aspirin Swelling   Flexeril [Cyclobenzaprine] Swelling   Trazamine [Trazodone & Diet Manage Prod] Nausea And Vomiting   Codeine Rash   Tramadol Rash    Patient Measurements: Height: '5\' 4"'$  (162.6 cm) Weight: 104.8 kg (231 lb 0.7 oz) IBW/kg (Calculated) : 54.7 Heparin Dosing Weight: 83.9 kg  Vital Signs: Temp: 99 F (37.2 C) (10/31 0103) Temp Source: Oral (10/30 2222) BP: 103/81 (10/31 0103) Pulse Rate: 79 (10/31 0103)  Labs: Recent Labs    09/26/22 0314 09/26/22 0912 09/27/22 0551 09/27/22 1357 09/27/22 2114 09/28/22 0628 09/28/22 1648 09/28/22 2319  HGB 9.3*  --  9.3*  --   --  9.1*  --   --   HCT 34.2*  --  33.1*  --   --  32.0*  --   --   PLT 118*  --  112*  --   --  121*  --   --   APTT 70* 86* 105* 44*  --   --   --   --   LABPROT 20.7*  --  17.7*  --   --   --   --   --   INR 1.8*  --  1.5*  --   --   --   --   --   HEPARINUNFRC 0.82*  --  0.39 0.11*   < > 0.28* 0.31 0.37  CREATININE 1.52*  --  1.30*  --   --  1.26*  --   --    < > = values in this interval not displayed.     Estimated Creatinine Clearance: 59.5 mL/min (A) (by C-G formula based on SCr of 1.26 mg/dL (H)).   Medical History: Past Medical History:  Diagnosis Date   Acute drug-induced gout of left foot 03/01/2018   Last Assessment & Plan:  Likely at least partially brought on by diuresis.  Needs to continue to diurese  Will push hydration Stop allopurinol given initiation during acute flare may worsen this, re-broach this when asymptomatic Avoid nsaids given stomach pain Trial colchicine Add acetaminophen Ice, elevate, rest   Allergy    seasonal   Anxiety    Arthritis    Right Knee   Asthma    CHF (congestive heart failure) (HCC)    COPD (chronic obstructive pulmonary disease) (HCC)    Coronary artery disease    Leaky  heart valve   Diabetes mellitus without complication (HCC)    Dysrhythmia    Fibromyalgia    GERD (gastroesophageal reflux disease)    Hypertension    Pneumonia    PUD (peptic ulcer disease)    Pulmonary HTN (HCC)    Rheumatic fever/heart disease    Sleep apnea     Medications:  Scheduled:   (feeding supplement) PROSource Plus  30 mL Oral TID BM   acidophilus  1 capsule Oral Daily   allopurinol  100 mg Oral Daily   dextromethorphan-guaiFENesin  1 tablet Oral BID   docusate sodium  100 mg Oral BID   feeding supplement  237 mL Oral TID BM   furosemide  40 mg Intravenous Daily   gabapentin  300 mg Oral BID   lactulose  30 g Oral BID   lactulose  300 mL Rectal BID   metolazone  2.5 mg Oral Once per day on Mon Wed Fri   metoprolol  tartrate  50 mg Oral BID   midodrine  10 mg Oral TID WC   multivitamin with minerals  1 tablet Oral Daily   pantoprazole (PROTONIX) IV  40 mg Intravenous Q12H   rifaximin  550 mg Oral BID   sodium chloride flush  3 mL Intravenous Q12H   sodium chloride flush  3 mL Intravenous Q12H   spironolactone  12.5 mg Oral Daily   Infusions:   sodium chloride     heparin 1,400 Units/hr (09/28/22 1428)   PRN: sodium chloride, acetaminophen **OR** acetaminophen, albuterol, HYDROcodone bit-homatropine, metoprolol tartrate, nitroGLYCERIN, prochlorperazine, sodium chloride flush, traZODone  Assessment: Chelsea Ramsey is a 56 y.o. female presenting with acute on chronic HF. PMH significant for DM, pulm HTN, COPD, CHF, GERD, OSA, Afib on Xarelto. Patient was on [facility] PTA per chart review, with last dose Xarelto 20 mg 10/26 @ 1905. Patient found to have potential hepatic dysfunction so PTA Xarelto being held at this time. Unclear etiology of liver dysfunction, continue to follow diagnosis/etiology for oral AC options at discharge. Pharmacy has been consulted to initiate and manage heparin infusion.   Baseline Labs 10/27: aPTT 40, HL >1.1, PT 31.0, INR 3.0, Hgb  9.6, Hct 34.6, Plt 132   Date Time HL aPTT Comment  10/28 0314  0.82 86 aPTT therapeutic x1 10/29 0551 0.39  105 aPTT SUPRAtherapeutic 10/29 1357 0.11 44  correlation confirmed, aPTT & HL SUBtherapeutic  10/29 2114 0.22 --- SUBtherapeutic 10/30 0628 0.28 --- SUBtherapeutic 10/30  1648     0.31  Therapeuticx1 10/30 2319 0.37  Therapeutic x 2  Goal of Therapy:  HL 0.3-0.5 due to bleeding risk (correlated with aPTT goal 66-85 sec) Monitor platelets by anticoagulation protocol: Yes   Plan:  continue heparin infusion rate at 1400 units/hr No boluses due to history of hemoptysis Recheck HL daily while therapeutic Continue to monitor H&H and platelets daily  Thank you for allowing pharmacy to be a part of this patient's care.  Chelsea Ramsey, PharmD, MBA 09/29/2022 1:15 AM

## 2022-09-29 NOTE — Progress Notes (Signed)
Progress Note   Patient: Chelsea Ramsey LNL:[id number] DOB: February 19, 1966 DOA: 09/15/2022     13 DOS: the patient was seen and examined on 09/29/2022   Brief hospital course: Taken from prior notes.  Patient is 56 year old female with DM, pulm HTN, COPD, CHF, GERD, OSA was seen by PCP on day of admission for not feeling well and abd pain for about a month. Patient complained of pain in RUQ and right flank with nausea.   abnormal outpatient labs showing AKI.   In ED, her labwork confirmed AKI with Cr 3.54, total bilirubin of 9.6.  AST mildly elevated to 57, ALT 35, Alk Phos 80.  U/S was done which showed gallbladder wall thickening to 5 mm with pericholecystic fluid, suspicious for acute cholecystitis.  CBD per U/S was 3 mm.   Surgery was consulted, recommended MRCP - MRCP showed biliary sludge, GB mild edematous. No biliary tract dilation to suggest obstruction.  No choledocholithiasis. 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  Also found to have acute systolic on chronic diastolic heart failure.  Echocardiogram shows EF of 20 to 25% with grade 3 diastolic dysfunction, right ventricular pressure and volume overload.  Prior echo done in December 2019 with a EF of 50 to 55%.  She was started on IV diuresis.  As blood pressure remained soft which interfere with other medical management, she was started on midodrine.  Remained in A-fib with intermittently going in RVR.  Missed Cardizem doses yesterday due to softer blood pressure.  Patient was on heparin infusion for the past 5 days, do not see any plan for cardiac cath by cardiology.  No clear recommendations. CMP remained with elevated T. bili at 9.1, she continued to have  upper abdominal pain. Concern of cardiohepatic with hepatic congestion secondary to heart failure. Secure chat with Dr. Humphrey Rolls and we will discontinue heparin infusion today and restart her home Xarelto.  Patient will get benefit from ischemic work-up with this new HFrEF. Norco switched with oxycodone immediate release to see if that will better serve her pain. No recorded urinary output as she was receiving IV diuresis-message sent to nursing staff. Potassium at 3.1 which is being repleted.  Renal functions continue to improve slowly.  10/23: Discussed with Dr. Nehemiah Massed on chat, there is no need for immediate ischemic work-up. T. bili started improving, at 8.5 today.  Rest of the labs improved.  Renal function stable. Pain with better control after changing to oxycodone.  Awaiting disposition.  10/24: Patient became more lethargic and somnolent in the evening, ABG and ammonia levels were checked and found to have CO2 of 59 and ammonia of 135.  Had an episode of self-limiting epistaxis followed by coughing up some blood overnight.  Patient is on Xarelto for atrial flutter, which is being held. Lactulose enema was added as she is unable to take much p.o. Remained very lethargic and somnolent this morning.  CMP with improvement in creatinine, T. bili at 8.8 with INR of 3.6.  Concern of hepatic failure, another message sent to GI.  Recent liver ultrasound with hepatic steatosis and portal hypertension, no cirrhosis.  VBG was mostly within normal limit except mildly elevated bicarb. Repeat ammonia levels with some improvement to 65. Currently unable to swallow as pocketing in her mouth, able to  answer orientation questions appropriately but response was very slow. Palliative care consult was also placed.  10/25: Patient little more alert and oriented.  Had multiple bowel movements with lactulose enema yesterday, ammonia at 49.  Worsening BNP at 12/07/2001 today, significant hypokalemia with potassium of  2.2, which is being repleted.  Magnesium at 1.9.  Worsening of T. bili at 9.8 And some improvement of creatinine to 1.25.  Cardiology ordered another limited echocardiogram for reevaluation of her volume status and heart failure. GI started her on rifaximin.  She also received vitamin K with some improvement of INR to 2.5 today.  Patient will remain very high risk for deterioration and mortality.  Currently DNR and palliative team is following.    10/31.  Discontinued Foley for voiding trial.  Switching heparin drip over to Xarelto lower dose.  Switching Lasix from IV to oral.  TOC working on delivering equipment to home.  Assessment and Plan: * Acute on chronic HFrEF (heart failure with reduced ejection fraction) ([place]) Patient was found to have new onset HFrEF with EF of 20 to 25% and grade 3 diastolic dysfunction.  Cardiology was consulted-no plan for more ischemic work-up during current hospitalization as she has clean coronaries in 2019. -Switch Lasix over to 60 mg p.o. daily.  On Zaroxolyn 3 times a week.  Patient on metoprolol, spironolactone   Epistaxis No further episodes.  Converting back to lower dose Xarelto.  Acute metabolic encephalopathy Patient has a picture of hepatic encephalopathy without any diagnosis of cirrhosis.  Concern of cardiohepatic.  Patient on lactulose and Xifaxan.   Abdominal pain Pt c/o right sided abd pain for one month.  Work-up so far with MRCP shows biliary sludge, mildly edematous gallbladder, no biliary tract dilation to suggest obstruction.  No choledocholithiasis.    Elevated LFTs Most likely secondary to congestive hepatopathy.  Severe hepatic steatosis seen on imaging.  Total bilirubin still elevated 7.6, AST slightly elevated at 58 and ALT normalized at 30.  Alkaline phosphatase normal  Acute renal failure [facility]) Patient presented with creatinine of 3.54 with baseline around 1. Slowly improving, creatinine at 1.23 today  HTN  (hypertension) Patient on midodrine 5 mg 3 times daily to elevate blood pressure.continue Lasix, metoprolol and Aldactone    Iron deficiency anemia Last hemoglobin 9.3.  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  A-fib (HCC) Remained in A-fib with going in and out of RVR.  Currently on metoprolol.  Switching back to Xarelto today  Tobacco dependence -Nicotine patch.   History of cocaine use UDS done on 09/16/2022 were positive for benzodiazepines, opioids and tricyclic.   Chronic low back pain (Primary Area of Pain) (Bilateral) w/ sciatica (Left) Seems to have an history of chronic pain syndrome and fibromyalgia. She was on oxycodone and Neurontin at home. -Restarting oxycodone and Neurontin  Hypokalemia Replaced during hospital course  Hypomagnesemia Continue replacement.  Moderate to severe pulmonary hypertension (HCC) Chronic respiratory failure. Patient is on 3 to 4 L of oxygen at home.   Severe pulmonary hypertension on echocardiogram  Obesity (BMI 30-39.9) Last BMI 39.66 with current height and weight in computer.  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        Subjective: Patient feels okay.  Foley catheter taken out this morning.  Urinated a little bit.  Initially came in with abdominal pain and acute kidney injury.  Physical Exam: Vitals:   09/29/22 0103 09/29/22  0414 09/29/22 0830 09/29/22 1230  BP: 103/81 106/70 111/75 101/77  Pulse: 79 87 86 89  Resp: _0 Temp: 99 F (37.2 C) 98.6 F (37 C) 98.1 F (36.7 C) 97.7 F (36.5 C)  TempSrc:  Oral Oral Oral  SpO2: 100% 100% 100% 100%  Weight:      Height:       Physical Exam HENT:     Head: Normocephalic.     Mouth/Throat:     Pharynx: No oropharyngeal exudate.  Eyes:     General: Lids are normal.     Conjunctiva/sclera: Conjunctivae normal.  Cardiovascular:     Rate  and Rhythm: Normal rate and regular rhythm.     Heart sounds: Normal heart sounds, S1 normal and S2 normal.  Pulmonary:     Breath sounds: Examination of the right-lower field reveals decreased breath sounds. Examination of the left-lower field reveals decreased breath sounds. Decreased breath sounds present. No wheezing, rhonchi or rales.  Abdominal:     Palpations: Abdomen is soft.     Tenderness: There is no abdominal tenderness.  Musculoskeletal:     Right lower leg: Swelling present.     Left lower leg: Swelling present.  Skin:    General: Skin is warm.     Findings: No rash.  Neurological:     Mental Status: She is alert.     Data Reviewed: QuantiFERON gold positive, 3 acid-fast cultures pending Creatinine 1.23, potassium 3.3, total bilirubin 7.6, hemoglobin 9.3, platelet count 124  Family Communication: Left message for patient's mother  Disposition: Status is: Inpatient Remains inpatient appropriate because: Setting up equipment at home.  Titrating off heparin drip back onto Xarelto today.  Discontinue Foley to see if she is able to urinate.  Planned Discharge Destination: Rehab recommended by physical therapy but family wants to take home with home health    Time spent: 28 minutes  Author: Loletha Grayer, MD 09/29/2022 3:41 PM  For on call review [email or web address].

## 2022-09-29 NOTE — Progress Notes (Signed)
PHARMACIST - PHYSICIAN COMMUNICATION  DR: Loletha Grayer, MD  CONCERNING: IV to Oral Route Change Policy  RECOMMENDATION: This patient is receiving pantoprazole by the intravenous route.  Based on criteria approved by the Pharmacy and Therapeutics Committee, the intravenous medication(s) is/are being converted to the equivalent oral dose form(s).   DESCRIPTION: These criteria include: The patient is eating (either orally or via tube) and/or has been taking other orally administered medications for a least 24 hours The patient has no evidence of active gastrointestinal bleeding or impaired GI absorption (gastrectomy, short bowel, patient on TNA or NPO).  If you have questions about this conversion, please contact the Pharmacy Department  '[]'$   [phone] )  [facility] '[x]'$   [phone] )  [name] '[]'$   [phone] )  [facility] '[]'$   [phone] )  [facility] '[]'$   [phone] )  [place], PharmD PGY1 Pharmacy Resident 09/29/2022 12:21 PM

## 2022-09-29 NOTE — Progress Notes (Addendum)
Patient was seen evaluated and examined by me and the PA on 09/29/22.  Course of action, evaluation, and management decisions were developed solely by me, but detailed below in the PA's note.  [facility] NOTE       Patient ID: Chelsea Ramsey MRN: [id number] DOB/AGE: [birth year] 56 y.o.  Admit date: 09/15/2022 Referring Physician Dr. Estill Cotta Primary Physician Dr. Gracy Racer Primary Cardiologist Dr. 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  Labs on admission showed acute renal failure (improved with diuresis), transaminitis, and hyperbilirubinemia, ultimately attributed to volume overload from her heart failure exacerbation.  Initial echo on 10/19 revealed a newly reduced LVEF with moderately reduced RV function, and severe TR. Hospital course complicated by atrial fibrillation with RVR and persistently elevated T. bili.  Ongoing primary complaint is generalized abdominal pain. Repeat limited echo on 10/25 after aggressive diuresis showed persistent dilation of hepatic vein, moderate RV enlargement, severely elevated PASP, and severe TR, c/w severe pulmonary hypertension.  Interval history: -Seen being assisted by 2 nurse techs getting off the commode after having bowel movement. -Feels good today, denies shortness of breath, chest pain, palpitations, abdominal pain. -Still has trace to 1+ edema in her bilateral lower  extremities/feet worse on the left side. -AFB sputum cultures pending.  She denies further hemoptysis.  Still has "a cough that is usual for everyone" with "white-colored phlegm." -Denies ever having treatment for tuberculosis in the past.   Past Medical History:  Diagnosis Date   Acute drug-induced gout of left foot 03/01/2018   Last Assessment & Plan:  Likely at least partially brought on by diuresis.  Needs to continue to diurese  Will push hydration Stop allopurinol given initiation during acute flare may worsen this, re-broach this when asymptomatic Avoid nsaids given stomach pain Trial colchicine Add acetaminophen Ice, elevate, rest   Allergy    seasonal   Anxiety    Arthritis    Right Knee   Asthma    CHF (congestive heart failure) (HCC)    COPD (chronic obstructive pulmonary disease) (HCC)    Coronary artery disease    Leaky heart valve   Diabetes mellitus without complication (HCC)    Dysrhythmia    Fibromyalgia    GERD (gastroesophageal reflux disease)    Hypertension    Pneumonia    PUD (peptic ulcer disease)    Pulmonary HTN (HCC)    Rheumatic fever/heart disease    Sleep apnea     Past Surgical History:  Procedure Laterality Date   ADENOIDECTOMY     COLONOSCOPY WITH PROPOFOL N/A 11/06/2019   Procedure: COLONOSCOPY WITH PROPOFOL;  Surgeon: Virgel Manifold, MD;  Location: [facility] ENDOSCOPY;  Service: Endoscopy;  Laterality: N/A;  2 day prep    COLONOSCOPY WITH PROPOFOL N/A 06/29/2022   Procedure: COLONOSCOPY WITH PROPOFOL;  Surgeon: Lin Landsman, MD;  Location: [facility] ENDOSCOPY;  Service: Gastroenterology;  Laterality: N/A;   CORONARY ARTERY BYPASS GRAFT  June 27 2018   ESOPHAGOGASTRODUODENOSCOPY (EGD) WITH PROPOFOL N/A 05/25/2018   Procedure: ESOPHAGOGASTRODUODENOSCOPY (EGD) WITH PROPOFOL;  Surgeon: Lucilla Lame, MD;  Location: [facility] ENDOSCOPY;  Service: Endoscopy;  Laterality: N/A;   ESOPHAGOGASTRODUODENOSCOPY (EGD) WITH PROPOFOL N/A 11/06/2019   Procedure:  ESOPHAGOGASTRODUODENOSCOPY (EGD) WITH PROPOFOL;  Surgeon: Virgel Manifold, MD;  Location: [facility] ENDOSCOPY;  Service: Endoscopy;  Laterality: N/A;   ESOPHAGOGASTRODUODENOSCOPY (EGD) WITH PROPOFOL N/A 06/29/2022   Procedure: ESOPHAGOGASTRODUODENOSCOPY (EGD) WITH PROPOFOL;  Surgeon: Lin Landsman, MD;  Location: [facility] ENDOSCOPY;  Service: Gastroenterology;  Laterality: N/A;   GIVENS CAPSULE STUDY N/A 07/22/2022   Procedure: GIVENS CAPSULE STUDY;  Surgeon: Lin Landsman, MD;  Location: [facility] ENDOSCOPY;  Service: Gastroenterology;  Laterality: N/A;   MITRAL VALVE REPLACEMENT     MULTIPLE TOOTH EXTRACTIONS     TONSILLECTOMY     TOTAL KNEE ARTHROPLASTY Right 09/04/2016   Procedure: TOTAL KNEE ARTHROPLASTY; with lateral release;  Surgeon: Earlie Server, MD;  Location: [place];  Service: Orthopedics;  Laterality: Right;    Medications Prior to Admission  Medication Sig Dispense Refill Last Dose   albuterol (VENTOLIN HFA) 108 (90 Base) MCG/ACT inhaler Inhale 1-2 puffs into the lungs every 6 (six) hours as needed for wheezing or shortness of breath.   Unknown at PRN   allopurinol (ZYLOPRIM) 100 MG tablet Take 1 tablet (100 mg total) by mouth 2 (two) times daily. 60 tablet 5 Past Week at Unknown   Cholecalciferol (VITAMIN D) 50 MCG (2000 UT) CAPS Take 1 capsule (2,000 Units total) by mouth daily. 30 capsule 5    colchicine 0.6 MG tablet Take 1 tablet (0.6 mg total) by mouth daily. As directed 30 tablet 5 Unknown at PRN   docusate sodium (COLACE) 100 MG capsule Take 100 mg by mouth 2 (two) times daily as needed for mild constipation or moderate constipation.   Unknown at PRN   empagliflozin (JARDIANCE) 10 MG TABS tablet Take 10 mg by mouth daily.   Past Week at Unknown   furosemide (LASIX) 40 MG tablet TAKE 2 TABLETS BY MOUTH IN THE MORNING AND 1 TABLET AT NIGHT (Patient taking differently: 40 mg 2 (two) times daily.) 90 tablet 5 Unknown at Unknown   gabapentin (NEURONTIN) 600 MG tablet Take 0.5  tablets (300 mg total) by mouth 2 (two) times daily. (Patient taking differently: Take 600 mg by mouth 3 (three) times daily.) 60 tablet 0 Past Week at Unknown   ipratropium-albuterol (DUONEB) 0.5-2.5 (3) MG/3ML SOLN INHALE THE CONTENTS OF 1 VIAL(3MLS) VIA NEBULIZER 3 TIMES DAILY AS NEEDED 360 mL 1    metolazone (ZAROXOLYN) 2.5 MG tablet Take 5 mg by mouth 3 (three) times a week.   Past Week at Unknown   metoprolol tartrate (LOPRESSOR) 100 MG tablet Take 1 tablet (100 mg total) by mouth 2 (two) times daily. 60 tablet 3 Past Week at Unknown   nitroGLYCERIN (NITROSTAT) 0.4 MG SL tablet Place 1 tablet (0.4 mg total) under the tongue every 5 (five) minutes as needed for chest pain. 30 tablet 12 Unknown at PRN   omeprazole (PRILOSEC) 40 MG capsule Take 40 mg by mouth daily.   Past Week at Unknown   oxyCODONE ER 9 MG C12A Take 9 mg by mouth 2 (two) times daily.   Past Week at Unknown   potassium chloride (KLOR-CON) 10 MEQ tablet Take 4 tablets (40 mEq total) by mouth 2 (two) times daily. And additional 20mq on metolazone days (Patient taking differently: Take 40 mEq by mouth  See admin instructions. Take 4 tablets (85mq) by mouth twice daily - take an additional 2 tablets (230m) by mouth when taking metolazone) 64 tablet 5 Past Week at Unknown   promethazine (PHENERGAN) 12.5 MG tablet Take 1 tablet (12.5 mg total) by mouth every 8 (eight) hours as needed for nausea or vomiting. (Patient taking differently: Take 12.5 mg by mouth 2 (two) times daily as needed for nausea or vomiting.) 30 tablet 0 Unknown at PRN   rivaroxaban (XARELTO) 20 MG TABS tablet Take 20 mg by mouth daily with supper.   Past Week at Unknown   rosuvastatin (CRESTOR) 10 MG tablet Take 10 mg by mouth daily.   Past Week at Unknown   Iron-FA-B Cmp-C-Biot-Probiotic (FUSION PLUS) CAPS Take 1 capsule by mouth daily. (Patient not taking: Reported on 09/16/2022) 30 capsule 3 Not Taking   Social History   Socioeconomic History   Marital status:  Married    Spouse name: Not on file   Number of children: Not on file   Years of education: Not on file   Highest education level: Not on file  Occupational History   Occupation: disabled  Tobacco Use   Smoking status: Every Day    Packs/day: 0.10    Types: Cigarettes   Smokeless tobacco: Never   Tobacco comments:    4/20 was not able to quit.  Still at 1-3 a day. She would like to wait to set a new quit date until we get back to class to have the extra in person support  Vaping Use   Vaping Use: Never used  Substance and Sexual Activity   Alcohol use: Never    Alcohol/week: 3.0 standard drinks of alcohol    Types: 3 Cans of beer per week    Comment: 16 oz per week   Drug use: Not Currently    Comment: Previous use of cocaine and marijuana last use 07/31/16    Sexual activity: Not Currently  Other Topics Concern   Not on file  Social History Narrative   Not on file   Social Determinants of Health   Financial Resource Strain: Low Risk  (10/02/2019)   Overall Financial Resource Strain (CARDIA)    Difficulty of Paying Living Expenses: Not hard at all  Food Insecurity: No Food Insecurity (09/18/2022)   Hunger Vital Sign    Worried About Running Out of Food in the Last Year: Never true    Ran Out of Food in the Last Year: Never true  Transportation Needs: No Transportation Needs (09/18/2022)   PRAPARE - TrHydrologistMedical): No    Lack of Transportation (Non-Medical): No  Physical Activity: Insufficiently Active (10/02/2019)   Exercise Vital Sign    Days of Exercise per Week: 7 days    Minutes of Exercise per Session: 20 min  Stress: No Stress Concern Present (10/02/2019)   [place]  Feeling of Stress : Not at all  Social Connections: Moderately Integrated (10/02/2019)   Social Connection and Isolation Panel [NHANES]    Frequency of Communication with Friends and Family: More  than three times a week    Frequency of Social Gatherings with Friends and Family: More than three times a week    Attends Religious Services: 1 to 4 times per year    Active Member of [employer] Organizations: Yes    Attends ClArchivisteetings: 1 to 4 times per year  Marital Status: Never married  Intimate Partner Violence: Not At Risk (09/18/2022)   Humiliation, Afraid, Rape, and Kick questionnaire    Fear of Current or Ex-Partner: No    Emotionally Abused: No    Physically Abused: No    Sexually Abused: No    Family History  Problem Relation Age of Onset   COPD Mother    Cancer Mother        Bone   Asthma Mother    Rheum arthritis Mother    Congestive Heart Failure Father    Breast cancer Maternal Aunt      Vitals:   09/29/22 0103 09/29/22 0414 09/29/22 0830 09/29/22 1230  BP: 103/81 106/70 111/75 101/77  Pulse: 79 87 86 89  Resp: '20 18 16   '$ Temp: 99 F (37.2 C) 98.6 F (37 C) 98.1 F (36.7 C) 97.7 F (36.5 C)  TempSrc:  Oral Oral Oral  SpO2: 100% 100% 100% 100%  Weight:      Height:        PHYSICAL EXAM General: Chronically ill-appearing black female, persistent coughing throughout interview. HEENT:  Normocephalic and atraumatic. Neck:  + JVD.  Lungs: normal respiratory effort on O2 by [place]. Decreased breath sounds bilaterally without appreciable crackles or wheezes.   Heart: irregularly irregular rhythm with controlled rate. Grade 3/6 systolic rumbling murmur and soft S2. abdomen: Non-distended appearing with a large pannus.   Msk: generalized weakness, appears very deconditioned Extremities: No clubbing, cyanosis.  Trace to 1+ left lower extremity edema, trace right lower extremity edema both feet are warm to touch  Neuro: alert and oriented today, asking appropriate questions regarding how she is doing clinically Psych: Appropriate for situation.  Labs: Basic Metabolic Panel: Recent Labs    09/27/22 0551 09/28/22 0628 09/29/22 0344  NA 139  137 138  K 3.6 3.7 3.3*  CL 91* 90* 89*  CO2 38* 37* 39*  GLUCOSE 77 80 78  BUN 37* 37* 35*  CREATININE 1.30* 1.26* 1.23*  CALCIUM 8.6* 8.7* 8.7*  MG 1.8 1.8  --   PHOS 2.7 3.0  --     Liver Function Tests: Recent Labs    09/28/22 0628 09/29/22 0344  AST 50* 58*  ALT 27 30  ALKPHOS 81 86  BILITOT 7.4* 7.6*  PROT 6.3* 6.7  ALBUMIN 2.3* 2.4*    No results for input(s): "LIPASE", "AMYLASE" in the last 72 hours. CBC: Recent Labs    09/28/22 0628 09/29/22 0344  WBC 8.7 9.1  HGB 9.1* 9.3*  HCT 32.0* 32.4*  MCV 83.1 82.4  PLT 121* 124*    Cardiac Enzymes: No results for input(s): "CKTOTAL", "CKMB", "CKMBINDEX", "TROPONINIHS" in the last 72 hours. BNP: No results for input(s): "BNP" in the last 72 hours.  D-Dimer: No results for input(s): "DDIMER" in the last 72 hours. Hemoglobin A1C: No results for input(s): "HGBA1C" in the last 72 hours. Fasting Lipid Panel: No results for input(s): "CHOL", "HDL", "LDLCALC", "TRIG", "CHOLHDL", "LDLDIRECT" in the last 72 hours. Thyroid Function Tests: No results for input(s): "TSH", "T4TOTAL", "T3FREE", "THYROIDAB" in the last 72 hours.  Invalid input(s): "FREET3" Anemia Panel: No results for input(s): "VITAMINB12", "FOLATE", "FERRITIN", "TIBC", "IRON", "RETICCTPCT" in the last 72 hours.   Radiology: [facility] Chest Port 1 View  Result Date: 09/25/2022 CLINICAL DATA:  Shortness of breath, renal failure. EXAM: PORTABLE CHEST 1 VIEW COMPARISON:  Chest radiograph dated 09/16/2022. FINDINGS: The heart is enlarged. Mild-to-moderate bilateral interstitial and airspace opacities are redemonstrated. There is no pleural  effusion or pneumothorax. Degenerative changes are seen in the spine. A mitral valve prosthesis and atrial appendage clip, and median sternotomy wires are redemonstrated. IMPRESSION: Cardiomegaly with mild-to-moderate bilateral interstitial and airspace opacities, likely pulmonary edema. Electronically Signed   By: Zerita Boers  M.D.   On: 09/25/2022 10:23   ECHOCARDIOGRAM LIMITED  Result Date: 09/23/2022    ECHOCARDIOGRAM LIMITED REPORT   Patient Name:   Chelsea Ramsey Date of Exam: 09/23/2022 Medical Rec #:  [id number]                 Height:       64.0 in Accession #:    [id number]                Weight:       243.4 lb Date of Birth:  02-01-1966                 BSA:          2.127 m Patient Age:    69 years                  BP:           109/71 mmHg Patient Gender: F                         HR:           96 bpm. Exam Location:  [facility] Procedure: Limited Echo Indications:     CHF I50.9  History:         Patient has prior history of Echocardiogram examinations, most                  recent 09/17/2022. CHF, Prior Cardiac Surgery, COPD; Risk                  Factors:Hypertension.                   Mitral Valve: bioprosthetic valve valve is present in the                  mitral position.  Sonographer:     Sherrie Sport Referring Phys:  [id number] [place] TANG Diagnosing Phys: Serafina Royals MD  Sonographer Comments: Technically difficult study due to poor echo windows, suboptimal apical window and suboptimal parasternal window. IMPRESSIONS  1. Left ventricular ejection fraction, by estimation, is 35 to 40%. The left ventricle has moderately decreased function. The left ventricle demonstrates global hypokinesis. The left ventricular internal cavity size was mildly dilated.  2. There is LV septal flattenting in systole and diastole and dilated hepatic vein consistant with severe phtn. Right ventricular systolic function is moderately reduced. The right ventricular size is moderately enlarged. Mildly increased right ventricular wall thickness. There is severely elevated pulmonary artery systolic pressure.  3. Left atrial size was severely dilated.  4. Right atrial size was severely dilated.  5. Cannot rule out mild stenosis. doppler difficult to interpret. The mitral valve is degenerative. Mild mitral valve regurgitation. There is  a bioprosthetic valve present in the mitral position.  6. The tricuspid valve is abnormal. Tricuspid valve regurgitation is severe.  7. The aortic valve is calcified. Aortic valve regurgitation is moderate. FINDINGS  Left Ventricle: Left ventricular ejection fraction, by estimation, is 35 to 40%. The left ventricle has moderately decreased function. The left ventricle demonstrates global hypokinesis. The left ventricular internal cavity size was mildly dilated. Right Ventricle: There is  LV septal flattenting in systole and diastole and dilated hepatic vein consistant with severe phtn. The right ventricular size is moderately enlarged. Mildly increased right ventricular wall thickness. Right ventricular systolic  function is moderately reduced. There is severely elevated pulmonary artery systolic pressure. Left Atrium: Left atrial size was severely dilated. Right Atrium: Right atrial size was severely dilated. Pericardium: There is no evidence of pericardial effusion. Mitral Valve: Cannot rule out mild stenosis. doppler difficult to interpret. The mitral valve is degenerative in appearance. Mild mitral valve regurgitation. 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 MV peak gradient, 19.2 mmHg. The mean mitral valve gradient is 11.0 mmHg. Tricuspid Valve: The tricuspid valve is abnormal. Tricuspid valve regurgitation is severe. Aortic Valve: The aortic valve is calcified. Aortic valve regurgitation is moderate. Pulmonic Valve: The pulmonic valve was normal in structure. Pulmonic valve regurgitation is mild. Aorta: The aortic root and ascending aorta are structurally normal, with no evidence of dilitation. IAS/Shunts: No atrial level shunt detected by color flow Doppler. LEFT VENTRICLE PLAX 2D LVIDd:         4.70 cm LVIDs:         3.10 cm LV PW:         0.90 cm LV IVS:        0.90 cm  RIGHT VENTRICLE RV Basal diam:  5.00 cm RV Mid diam:    6.50 cm LEFT ATRIUM         Index LA diam:    6.50 cm 3.06  cm/m   AORTA Ao Root diam: 2.90 cm MITRAL VALVE                TRICUSPID VALVE MV Area (PHT): 2.63 cm     TR Peak grad:   53.0 mmHg MV Peak grad:  19.2 mmHg    TR Vmax:        364.00 cm/s MV Mean grad:  11.0 mmHg MV Vmax:       2.19 m/s MV Vmean:      161.0 cm/s MV Decel Time: 288 msec MV E velocity: 239.00 cm/s Serafina Royals MD Electronically signed by Serafina Royals MD Signature Date/Time: 09/23/2022/4:02:55 PM    Final    ECHOCARDIOGRAM COMPLETE  Result Date: 09/17/2022    ECHOCARDIOGRAM REPORT   Patient Name:   Chelsea Ramsey Date of Exam: 09/17/2022 Medical Rec #:  [id number]                 Height:       64.0 in Accession #:    [id number]                Weight:       265.0 lb Date of Birth:  October 25, 1966                 BSA:          2.205 m Patient Age:    20 years                  BP:           93/79 mmHg Patient Gender: F                         HR:           110 bpm. Exam Location:  [facility] Procedure: 2D Echo, Color Doppler and Cardiac Doppler Indications:     I50.31 CHF-Acute Diastolic  History:  Patient has prior history of Echocardiogram examinations, most                  recent 11/05/2018. CHF, CAD, COPD; Risk Factors:Hypertension,                  Diabetes and Sleep Apnea.  Sonographer:     Charmayne Sheer Referring Phys:  [id number] [place] TANG Diagnosing Phys: Yolonda Kida MD  Sonographer Comments: Suboptimal apical window and no subcostal window. Image acquisition challenging due to patient body habitus and Image acquisition challenging due to COPD. IMPRESSIONS  1. Left ventricular ejection fraction, by estimation, is 20 to 25%. The left ventricle has severely decreased function. The left ventricle demonstrates global hypokinesis. Left ventricular diastolic parameters are consistent with Grade III diastolic dysfunction (restrictive). There is the interventricular septum is flattened in systole and diastole, consistent with right ventricular pressure and volume overload.  2.  Right ventricular systolic function is moderately reduced. The right ventricular size is moderately enlarged. Mildly increased right ventricular wall thickness.  3. Left atrial size was moderately dilated.  4. Right atrial size was severely dilated.  5. The mitral valve is abnormal. Mild to moderate mitral valve regurgitation.  6. Tricuspid valve regurgitation is severe.  7. The aortic valve is calcified. Aortic valve regurgitation is mild to moderate. Aortic valve sclerosis/calcification is present, without any evidence of aortic stenosis. Conclusion(s)/Recommendation(s): Poor windows for evaluation of left ventricular function by transthoracic echocardiography. Would recommend an alternative means of evaluation. FINDINGS  Left Ventricle: Left ventricular ejection fraction, by estimation, is 20 to 25%. The left ventricle has severely decreased function. The left ventricle demonstrates global hypokinesis. The left ventricular internal cavity size was normal in size. There is borderline concentric left ventricular hypertrophy. The interventricular septum is flattened in systole and diastole, consistent with right ventricular pressure and volume overload. Left ventricular diastolic parameters are consistent with Grade III diastolic dysfunction (restrictive). Right Ventricle: The right ventricular size is moderately enlarged. Mildly increased right ventricular wall thickness. Right ventricular systolic function is moderately reduced. Left Atrium: Left atrial size was moderately dilated. Right Atrium: Right atrial size was severely dilated. Pericardium: There is no evidence of pericardial effusion. Mitral Valve: The mitral valve is abnormal. Mild to moderate mitral valve regurgitation. Tricuspid Valve: The tricuspid valve is grossly normal. Tricuspid valve regurgitation is severe. Aortic Valve: The aortic valve is calcified. Aortic valve regurgitation is mild to moderate. Aortic regurgitation PHT measures 233 msec.  Aortic valve sclerosis/calcification is present, without any evidence of aortic stenosis. Aortic valve mean gradient measures 6.0 mmHg. Aortic valve peak gradient measures 11.3 mmHg. Aortic valve area, by VTI measures 1.14 cm. Pulmonic Valve: The pulmonic valve was normal in structure. Pulmonic valve regurgitation is mild to moderate. Aorta: The ascending aorta was not well visualized. IAS/Shunts: No atrial level shunt detected by color flow Doppler.  LEFT VENTRICLE PLAX 2D LVIDd:         3.50 cm LVIDs:         3.20 cm LV PW:         1.20 cm LV IVS:        0.90 cm LVOT diam:     1.80 cm LV SV:         25 LV SV Index:   12 LVOT Area:     2.54 cm  RIGHT VENTRICLE RV Basal diam:  5.30 cm RV Mid diam:    5.00 cm RV S prime:     7.62  cm/s LEFT ATRIUM         Index       RIGHT ATRIUM           Index LA diam:    5.50 cm 2.49 cm/m  RA Area:     28.50 cm                                 RA Volume:   104.00 ml 47.17 ml/m  AORTIC VALVE                     PULMONIC VALVE AV Area (Vmax):    0.87 cm      PV Vmax:          0.98 m/s AV Area (Vmean):   0.90 cm      PV Vmean:         69.200 cm/s AV Area (VTI):     1.14 cm      PV VTI:           0.128 m AV Vmax:           168.00 cm/s   PV Peak grad:     3.9 mmHg AV Vmean:          116.000 cm/s  PV Mean grad:     2.5 mmHg AV VTI:            0.223 m       PR End Diast Vel: 10.24 msec AV Peak Grad:      11.3 mmHg AV Mean Grad:      6.0 mmHg LVOT Vmax:         57.60 cm/s LVOT Vmean:        40.900 cm/s LVOT VTI:          0.100 m LVOT/AV VTI ratio: 0.45 AI PHT:            233 msec  AORTA Ao Root diam: 3.10 cm MITRAL VALVE               TRICUSPID VALVE MV Area (PHT): 5.95 cm    TR Peak grad:   55.7 mmHg MV Decel Time: 128 msec    TR Vmax:        373.00 cm/s MV E velocity: 87.60 cm/s                            SHUNTS                            Systemic VTI:  0.10 m                            Systemic Diam: 1.80 cm Yolonda Kida MD Electronically signed by Yolonda Kida MD  Signature Date/Time: 09/17/2022/4:54:34 PM    Final    DG Chest 1 View  Result Date: 09/16/2022 CLINICAL DATA:  Worsening shortness of breath. 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 Cardiomegaly which has increased from 2021. Slight globular configuration of the heart, query pericardial effusion. Suspect small right pleural effusion. Chronic bilateral lung opacities that appears stable. No acute airspace disease or pneumothorax. On limited assessment, no acute osseous findings.  IMPRESSION: 1. Increased cardiomegaly from 2021. Slight globular configuration of the heart, query pericardial effusion. 2. Suspect small right pleural effusion. 3. Stable chronic bilateral lung opacities. Electronically Signed   By: Keith Rake M.D.   On: 09/16/2022 17:21   MR ABDOMEN MRCP WO CONTRAST  Result Date: 09/16/2022 CLINICAL DATA:  56 year old female with history of jaundice. Nausea. EXAM: MRI ABDOMEN WITHOUT CONTRAST  (INCLUDING MRCP) TECHNIQUE: Multiplanar multisequence MR imaging of the abdomen was performed. Heavily T2-weighted images of the biliary and pancreatic ducts were obtained, and three-dimensional MRCP images were rendered by post processing. COMPARISON:  No prior abdominal MRI or MRCP. Abdominal ultrasound 09/15/2022. FINDINGS: Comment: Portions of today's examination are substantially limited by patient respiratory motion. The study is also severely limited for detection and characterization of visceral and/or vascular lesions by lack of IV gadolinium. Lower chest: Severe cardiomegaly. Massively distended inferior vena cava. Hepatobiliary: No definite suspicious cystic or solid hepatic lesions are confidently identified on today's motion limited noncontrast examination. No intra or extrahepatic biliary ductal dilatation noted on MRCP images. Common bile duct measures only  3 mm in the porta hepatis. No filling defect within the common bile duct to suggest choledocholithiasis. T1 hyperintense, T2 hypointense material lies dependently within the gallbladder. Gallbladder does not appear overly distended. Accurate assessment for gallbladder wall thickening is limited on today's examination secondary to motion, but there may be some very mild gallbladder wall thickening and edema. No overt pericholecystic fluid or surrounding inflammatory changes. Pancreas: No definite pancreatic mass or peripancreatic fluid collections or inflammatory changes noted on today's noncontrast examination. No pancreatic ductal dilatation. Spleen:  Unremarkable. Adrenals/Urinary Tract: Unenhanced appearance of the kidneys and bilateral adrenal glands is normal. No hydroureteronephrosis in the visualized portions of the abdomen. Stomach/Bowel: Visualized portions are unremarkable. Vascular/Lymphatic: No aneurysm identified in the visualized abdominal vasculature. No lymphadenopathy noted in the abdomen. Other: No significant volume of ascites noted in the visualized portions of the peritoneal cavity. Musculoskeletal: No aggressive appearing osseous lesions are noted in the visualized portions of the skeleton. IMPRESSION: 1. Biliary sludge lying dependently in the gallbladder. Gallbladder wall may be mildly edematous, but is poorly evaluated on today's motion limited examination. If there is any clinical concern for acute cholecystitis, further evaluation with right upper quadrant abdominal ultrasound or nuclear medicine hepatobiliary scan should be considered at this time. 2. No biliary tract dilatation to suggest obstruction. No evidence of choledocholithiasis. 3. Severe cardiomegaly. Electronically Signed   By: Vinnie Langton M.D.   On: 09/16/2022 05:28   US ABDOMEN LIMITED RUQ (LIVER/GB)  Result Date: 09/15/2022 CLINICAL DATA:  Elevated bilirubin EXAM: ULTRASOUND ABDOMEN LIMITED RIGHT UPPER QUADRANT  COMPARISON:  CT abdomen and pelvis 12/01/2018 FINDINGS: Gallbladder: Sludge is present within the gallbladder. Small amount of pericholecystic fluid. The gallbladder wall is mildly thickened measuring 5 mm. Sonographic Murphy's sign was positive. Common bile duct: Diameter: 3 mm Liver: No focal lesion identified. Increased echogenicity. Portal vein is patent on Doppler imaging with bidirectional flow. Other: None. IMPRESSION: 1. Findings compatible with acute cholecystitis. 2. 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 Electronically Signed   By: Placido Sou M.D.   On: 09/15/2022 23:07    L/RHC 05/13/2018 Date of Procedure: 05/06/18  ___________________________________________________________________________  _  Findings:  1. No angiographically apparent coronary artery disease.  2. Elevated pulmonary capillary (23 mmHg)  3. Preserved cardiac output (6.2) and index (2.8)   Recommendations:  1. Intensive secondary prevention.  2. Follow  up with primary cardiologist.   Complications:   None  ___________________________________________________________________________  _  Referring Physician: Vella Raring, MD  Primary Cardiologist: Vella Raring, MD  Primary Care Physician: Kathee Delton   Performing Attending: Nena Polio, MD  Diagnostic Fellow: Lorrene Reid, MD  Interventional Fellow: NA   Procedures performed: Right Heart Catheterization and Coronary angiography  Access Site: Right Radial Artery  Arterial Closure: TR Band   Contrast Volume: 30 mL  Fluoroscopy Time: 6.3 mins  Radiation Dose: 372 mGy  ___________________________________________________________________________  _  HISTORY   56 yo female with history of HTN, moderate to severe MR and 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     FINDINGS   Right Heart Catheterization   RA mean: 11 mmHg  RV: 63/1 (11) mmHg  PA (mean): 64/29 (44) mmHg  PCWP mean: 23 mmHg  PA Sat: 60 %  Art Sat: 95 %  Fick Cardiac output: 6.2 L/min  Fick Cardiac index: 2.8 L/min/m2   Hemodynamics and Coronary Angiography:    Aortic pressure: 104/73 mmHg   Coronary Angiography   Dominance: Right   Left main (LMCA): LMCA is a large-caliber vessel that originates from  the left coronary sinus. It bifurcates into the left anterior descending  (LAD) and left circumflex (LCx) arteries. There is no angiographic  evidence of significant disease in the LMCA.   Left anterior descending (LAD): The LAD is a large-caliber vessel that  gives off two diagonal (D) branches before it wraps around the apex. D1 is  a moderate caliber vessel with a high take-off. D2 is a small caliber  vessel. There is no angiographic evidence of significant disease in the  LAD.   Circumflex (LCx): The LCx is a medium-caliber vessel that gives off two  obtuse marginal (OM) branches and then continues as a small vessel in the  AV groove. OM1 is a small caliber vessel. OM2 is a moderate caliber  branching vessel. There is no angiographic evidence of significant disease  in the LCx.   Right Coronary (RCA): The RCA is a large-caliber vessel originating from  the right coronary sinus. It bifurcates distally into the posterior  descending artery (PDA) and three posterolateral branches (PL) consistent  with a right dominant system. There is no angiographic evidence of  significant disease in the RCA.   TELEMETRY reviewed by me (LT) 09/29/2022 : Atrial fibrillation versus flutter rates 80-90s  EKG reviewed by me: Atrial flutter with 4 1 conduction rate 84, QTc 470 on 10/23  Data reviewed by me (LT) 09/29/2022:  hospitalist progress note, PT notes, nursing notes, CBC, CMP, INR, BNP, vitals, telemetry, I/O, ID note  Principal Problem:   Acute on chronic  HFrEF (heart failure with reduced ejection fraction) (HCC) Active Problems:   A-fib (HCC)   HTN (hypertension)   Chronic low back pain (Primary Area of Pain) (Bilateral) w/ sciatica (Left)   Moderate to severe pulmonary hypertension (HCC)   Tobacco dependence   History of cocaine use   Iron deficiency anemia   Abdominal pain   Acute renal failure (HCC)   Elevated LFTs   Hypokalemia   Hypomagnesemia   Acute metabolic encephalopathy   Epistaxis   Positive QuantiFERON-TB Gold test   Obesity (BMI 30-39.9)    ASSESSMENT AND PLAN:  Chelsea Ramsey is a 63yoF with a PMH of  HFrEF (current EF 35-40% G3 DD, global hypokinesis, previous EF >55% without MR or AR 2022), 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  Labs on admission showed acute renal failure (improved with diuresis), transaminitis, and hyperbilirubinemia, ultimately attributed to volume overload from her heart failure exacerbation.  Initial echo on 10/19 revealed a newly reduced LVEF with moderately reduced RV function, and severe TR. Hospital course complicated by atrial fibrillation with RVR and persistently elevated T. bili.  Ongoing primary complaint is generalized abdominal pain. Repeat limited echo on 10/25 after aggressive diuresis showed persistent dilation of hepatic vein, moderate RV enlargement, severely elevated PASP, and severe TR, c/w severe pulmonary hypertension.  #Positive QuantiFERON gold #Cough, hemoptysis -Cough and hemoptysis started overnight on 10/26, leukocytosis to 13,700 on 10/26, improved. She has been afebrile - seen by ID, thinks hemoptysis could be from pulmonary HTN. - sputum cultures pending.   #severe  pulmonary hypertension #RV dysfunction Seen on repeat echo 10/25 with severe TR, moderately reduced RV function, severely elevated PASP. [place] in 2019 showed mildly elevated PA pressure 23 mmHg.  She was last seen by pulmonology in 04/2020 for her COPD/asthma but does not seem to be on therapy specific for pulmonary hypertension. Per chart review she had not been feeling well (abdominal pain, weakness) since the end of August after her upper endoscopy.  -We will continue diuresis as below -recommend outpatient follow up with pulmonology (established with Dr. Lanney Gins)  #Acute on chronic HFrEF (EF 35-40%, global hypokinesis and G3 DD, RV dilation and dysfunction, mild to moderate MR, AR) #Rheumatic mitral valve stenosis s/p MVR 2019 #Abdominal pain BNP on admission was elevated at 558.5 and uptrending to 1800 on 10/25 despite diuresis. Echo 10/19 shows significant reduction in EF 20-25% while in AF RVR from >55% 06/2021 and new AR and MR compared to prior study. Repeat on 10/25 while rate was better controlled and after diuresis was 35-40%. Her abdominal pain was ultimately attributed to volume overload/hepatic insufficiency after acute cholecystitis, choledocholithiasis, and a hematologic disorder were ruled out by general surgery, GI, and hematology.  Her volume status has much improved with aggressive diuresis, and feels "awesome" on 10/30.  She admitted to medication noncompliance on 10/30 once more alert and mental status that improved.  Suspect this led to her decompensation.  Labs trending towards euvolemia, remains with trace lower extremity edema, but overall volume status is much improved. -S/p IV Lasix 40 mg BID x 8 days.  -We will stop IV Lasix 40 mg after today's dose, then start 60 mg p.o. Lasix tomorrow 11/1 -S/p metolazone 5 mg x 1 10/24, 2.5x1 on 10/26.   -schedule metolazone 2.'5mg'$  PO on MWF  -Continue spironolactone 12.5 mg once daily -Continue potassium replacement 40 mEq twice  daily -Wean midodrine from 10 mg 3 times daily to 5 mg 3 times daily, favor continuing to decrease this as able -Will consider addition of ACEi/ARB/Entresto if her blood pressure allows, still with relative hypotension requiring midodrine at 101/77 this afternoon. -Hold off on Jardiance for now, consider restarting on an outpatient basis. -Home diuretics are Lasix 40 p.o. once daily, metolazone 5 mg 3 times weekly with 40 mEq potassium twice daily and 60 mEq twice daily on  days she takes metolazone.  #Paroxysmal atrial fibrillation with RVR, now in atrial flutter Was previously managed well on metoprolol tartrate 100 mg twice daily.  Tachyarrhythmia on admission worsening her RV dysfunction. -S/p sotalol 80 mg once daily (started on 10/20) due to QTc prolongation (401 on 10/21, 470 on 10/23).  Not a great option with aggressive diuresis, persistent hypokalemia. -Intolerant of amiodarone (N/V), digoxin not initiated on admission due to acute renal failure -consolidate metoprolol tartrate to '50mg'$  BID (home dose 100 mg twice daily)  -Discontinue diltiazem due to reduced EF and hypotension -She was on a heparin infusion on admission, has been switched back to her Xarelto, 20 mg okay per her total body weight and creatinine clearance per pharmacy on 10/23. -Xarelto held with hemoptysis/epistaxis overnight on 10/23 was given once on 10/26, but switched back to heparin on 10/27 due to hemoptsis.   -Discussed with Dr. Clayborn Bigness who recommends dose reduced Xarelto at '15mg'$  at discharge (d/t hepatic dysfunction), will stop heparin and start Xarelto today 10/31. -Continue rate control strategy, consider DCCV on an outpatient basis after 3 weeks of appropriate anticoagulation -Monitor and replete electrolytes to maintain a K >4, mag >2  #Hyperbilirubinemia s/p MRCP #Labile and elevated INR #Acute encephalopathy, resolved -t bili has remained elevated since admission, finally improving from 9.0 to 7.6 today.   -ammonia improved from 135 with lactulose to 49 on 10/25. -INR improved almost WNL at 1.4. -Encephalopathy felt to be related to opioid analgesics, which have now been discontinued. -Appreciate GI input.  #AKI Presented with a BUN/creatinine of 50/3.52 and GFR 15 -Has normalized with diuresis -Daily CMP  This patient's plan of care was discussed and created with Dr. Clayborn Bigness and he is in agreement.  Signed: Tristan Schroeder , PA-C 09/29/2022, 1:51 PM [facility]

## 2022-09-30 ENCOUNTER — Ambulatory Visit: Payer: Medicaid Other | Admitting: Gastroenterology

## 2022-09-30 DIAGNOSIS — R7401 Elevation of levels of liver transaminase levels: Secondary | ICD-10-CM

## 2022-09-30 DIAGNOSIS — N179 Acute kidney failure, unspecified: Secondary | ICD-10-CM | POA: Diagnosis not present

## 2022-09-30 DIAGNOSIS — I5023 Acute on chronic systolic (congestive) heart failure: Secondary | ICD-10-CM | POA: Diagnosis not present

## 2022-09-30 LAB — CBC
HCT: 32.8 % — ABNORMAL LOW (ref 36.0–46.0)
Hemoglobin: 9.3 g/dL — ABNORMAL LOW (ref 12.0–15.0)
MCH: 24 pg — ABNORMAL LOW (ref 26.0–34.0)
MCHC: 28.4 g/dL — ABNORMAL LOW (ref 30.0–36.0)
MCV: 84.8 fL (ref 80.0–100.0)
Platelets: 143 10*3/uL — ABNORMAL LOW (ref 150–400)
RBC: 3.87 MIL/uL (ref 3.87–5.11)
RDW: 25.3 % — ABNORMAL HIGH (ref 11.5–15.5)
WBC: 8.1 10*3/uL (ref 4.0–10.5)
nRBC: 0.2 % (ref 0.0–0.2)

## 2022-09-30 LAB — BASIC METABOLIC PANEL
Anion gap: 11 (ref 5–15)
BUN: 33 mg/dL — ABNORMAL HIGH (ref 6–20)
CO2: 38 mmol/L — ABNORMAL HIGH (ref 22–32)
Calcium: 8.9 mg/dL (ref 8.9–10.3)
Chloride: 89 mmol/L — ABNORMAL LOW (ref 98–111)
Creatinine, Ser: 1.13 mg/dL — ABNORMAL HIGH (ref 0.44–1.00)
GFR, Estimated: 57 mL/min — ABNORMAL LOW (ref >60)
Glucose, Bld: 105 mg/dL — ABNORMAL HIGH (ref 70–99)
Potassium: 3.5 mmol/L (ref 3.5–5.1)
Sodium: 138 mmol/L (ref 135–145)

## 2022-09-30 LAB — ACID FAST SMEAR (AFB, MYCOBACTERIA): Acid Fast Smear: NEGATIVE

## 2022-09-30 LAB — HEPATIC FUNCTION PANEL
ALT: 30 U/L (ref 0–44)
AST: 61 U/L — ABNORMAL HIGH (ref 15–41)
Albumin: 2.5 g/dL — ABNORMAL LOW (ref 3.5–5.0)
Alkaline Phosphatase: 94 U/L (ref 38–126)
Bilirubin, Direct: 3.9 mg/dL — ABNORMAL HIGH (ref 0.0–0.2)
Indirect Bilirubin: 3.8 mg/dL — ABNORMAL HIGH (ref 0.3–0.9)
Total Bilirubin: 7.7 mg/dL — ABNORMAL HIGH (ref 0.3–1.2)
Total Protein: 6.6 g/dL (ref 6.5–8.1)

## 2022-09-30 LAB — ACID FAST CULTURE WITH REFLEXED SENSITIVITIES (MYCOBACTERIA)

## 2022-09-30 LAB — MAGNESIUM: Magnesium: 1.7 mg/dL (ref 1.7–2.4)

## 2022-09-30 MED ORDER — POTASSIUM CHLORIDE CRYS ER 20 MEQ PO TBCR
40.0000 meq | EXTENDED_RELEASE_TABLET | Freq: Every day | ORAL | Status: DC
  Administered 2022-09-30: 40 meq via ORAL
  Filled 2022-09-30: qty 2

## 2022-09-30 MED ORDER — METOPROLOL TARTRATE 50 MG PO TABS
50.0000 mg | ORAL_TABLET | Freq: Two times a day (BID) | ORAL | Status: DC
  Administered 2022-10-01: 50 mg via ORAL
  Filled 2022-09-30 (×2): qty 1

## 2022-09-30 MED ORDER — POTASSIUM CHLORIDE CRYS ER 20 MEQ PO TBCR
40.0000 meq | EXTENDED_RELEASE_TABLET | Freq: Two times a day (BID) | ORAL | Status: DC
  Administered 2022-09-30: 40 meq via ORAL
  Filled 2022-09-30: qty 2

## 2022-09-30 MED ORDER — METOPROLOL TARTRATE 50 MG PO TABS: 75.0000 mg | ORAL_TABLET | Freq: Two times a day (BID) | ORAL | Status: DC

## 2022-09-30 NOTE — TOC Progression Note (Signed)
Transition of Care [facility]) - Progression Note    Patient Details  Name: Chelsea Ramsey MRN: [id number] Date of Birth: 03-Feb-1966  Transition of Care [facility]) CM/SW [place], [place] Phone Number: 09/30/2022, 2:59 PM  Clinical Narrative:     CSW has lvm with patient's mother [name] to ensure all dme was delivered to home and they have all equipment needs.        Expected Discharge Plan and Services                                                 Social Determinants of Health (SDOH) Interventions    Readmission Risk Interventions     No data to display

## 2022-09-30 NOTE — Progress Notes (Addendum)
Patient was seen evaluated and examined by me and the PA on 09/30/22.  Course of action, evaluation, and management decisions were developed solely by me, but detailed below in the PA's note. [place] NOTE       Patient ID: Chelsea Ramsey MRN: [id number] DOB/AGE: 1966/02/11 56 y.o.  Admit date: 09/15/2022 Referring Physician Dr. Estill Cotta Primary Physician Dr. Gracy Racer Primary Cardiologist Dr. 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  Labs on admission showed acute renal failure (improved with diuresis), transaminitis, and hyperbilirubinemia, ultimately attributed to volume overload from her heart failure exacerbation.  Initial echo on 10/19 revealed a newly reduced LVEF with moderately reduced RV function, and severe TR. Hospital course complicated by atrial fibrillation with RVR and persistently elevated T. bili.  Ongoing primary complaint is generalized abdominal pain. Repeat limited echo on 10/25 after aggressive diuresis showed persistent dilation of hepatic vein, moderate RV enlargement, severely elevated PASP, and severe TR, c/w severe pulmonary hypertension.  Interval history: -Did not sleep good last night, issues with getting comfortable positioning in the bed  -No further cough or hemoptysis, does not feel short of breath, no chest pain or heart racing.  Feels very anxious and is asking about her Valium and  pain meds -She is tachycardic on telemetry, in the 110s to 120s but has not taken her morning metoprolol yet -She has diuresed well, net -3.6 L that has been documented -Pending DME delivered to her home   Past Medical History:  Diagnosis Date   Acute drug-induced gout of left foot 03/01/2018   Last Assessment & Plan:  Likely at least partially brought on by diuresis.  Needs to continue to diurese  Will push hydration Stop allopurinol given initiation during acute flare may worsen this, re-broach this when asymptomatic Avoid nsaids given stomach pain Trial colchicine Add acetaminophen Ice, elevate, rest   Allergy    seasonal   Anxiety    Arthritis    Right Knee   Asthma    CHF (congestive heart failure) (HCC)    COPD (chronic obstructive pulmonary disease) (HCC)    Coronary artery disease    Leaky heart valve   Diabetes mellitus without complication (HCC)    Dysrhythmia    Fibromyalgia    GERD (gastroesophageal reflux disease)    Hypertension    Pneumonia    PUD (peptic ulcer disease)    Pulmonary HTN (HCC)    Rheumatic fever/heart disease    Sleep apnea     Past Surgical History:  Procedure Laterality Date   ADENOIDECTOMY     COLONOSCOPY WITH PROPOFOL N/A 11/06/2019   Procedure: COLONOSCOPY WITH PROPOFOL;  Surgeon: Virgel Manifold, MD;  Location: [facility] ENDOSCOPY;  Service: Endoscopy;  Laterality: N/A;  2 day prep    COLONOSCOPY WITH PROPOFOL N/A 06/29/2022   Procedure: COLONOSCOPY WITH PROPOFOL;  Surgeon: Lin Landsman, MD;  Location: [facility] ENDOSCOPY;  Service: Gastroenterology;  Laterality: N/A;   CORONARY ARTERY  BYPASS GRAFT     June 27 2018   ESOPHAGOGASTRODUODENOSCOPY (EGD) WITH PROPOFOL N/A 05/25/2018   Procedure: ESOPHAGOGASTRODUODENOSCOPY (EGD) WITH PROPOFOL;  Surgeon: Lucilla Lame, MD;  Location: [facility] ENDOSCOPY;  Service: Endoscopy;  Laterality: N/A;   ESOPHAGOGASTRODUODENOSCOPY (EGD) WITH PROPOFOL N/A 11/06/2019   Procedure: ESOPHAGOGASTRODUODENOSCOPY (EGD)  WITH PROPOFOL;  Surgeon: Virgel Manifold, MD;  Location: [facility] ENDOSCOPY;  Service: Endoscopy;  Laterality: N/A;   ESOPHAGOGASTRODUODENOSCOPY (EGD) WITH PROPOFOL N/A 06/29/2022   Procedure: ESOPHAGOGASTRODUODENOSCOPY (EGD) WITH PROPOFOL;  Surgeon: Lin Landsman, MD;  Location: [facility] ENDOSCOPY;  Service: Gastroenterology;  Laterality: N/A;   GIVENS CAPSULE STUDY N/A 07/22/2022   Procedure: GIVENS CAPSULE STUDY;  Surgeon: Lin Landsman, MD;  Location: [facility] ENDOSCOPY;  Service: Gastroenterology;  Laterality: N/A;   MITRAL VALVE REPLACEMENT     MULTIPLE TOOTH EXTRACTIONS     TONSILLECTOMY     TOTAL KNEE ARTHROPLASTY Right 09/04/2016   Procedure: TOTAL KNEE ARTHROPLASTY; with lateral release;  Surgeon: Earlie Server, MD;  Location: [place];  Service: Orthopedics;  Laterality: Right;    Medications Prior to Admission  Medication Sig Dispense Refill Last Dose   albuterol (VENTOLIN HFA) 108 (90 Base) MCG/ACT inhaler Inhale 1-2 puffs into the lungs every 6 (six) hours as needed for wheezing or shortness of breath.   Unknown at PRN   allopurinol (ZYLOPRIM) 100 MG tablet Take 1 tablet (100 mg total) by mouth 2 (two) times daily. 60 tablet 5 Past Week at Unknown   Cholecalciferol (VITAMIN D) 50 MCG (2000 UT) CAPS Take 1 capsule (2,000 Units total) by mouth daily. 30 capsule 5    colchicine 0.6 MG tablet Take 1 tablet (0.6 mg total) by mouth daily. As directed 30 tablet 5 Unknown at PRN   docusate sodium (COLACE) 100 MG capsule Take 100 mg by mouth 2 (two) times daily as needed for mild constipation or moderate constipation.   Unknown at PRN   empagliflozin (JARDIANCE) 10 MG TABS tablet Take 10 mg by mouth daily.   Past Week at Unknown   furosemide (LASIX) 40 MG tablet TAKE 2 TABLETS BY MOUTH IN THE MORNING AND 1 TABLET AT NIGHT (Patient taking differently: 40 mg 2 (two) times daily.) 90 tablet 5 Unknown at Unknown   gabapentin (NEURONTIN) 600 MG tablet Take 0.5 tablets (300 mg total) by mouth 2  (two) times daily. (Patient taking differently: Take 600 mg by mouth 3 (three) times daily.) 60 tablet 0 Past Week at Unknown   ipratropium-albuterol (DUONEB) 0.5-2.5 (3) MG/3ML SOLN INHALE THE CONTENTS OF 1 VIAL(3MLS) VIA NEBULIZER 3 TIMES DAILY AS NEEDED 360 mL 1    metolazone (ZAROXOLYN) 2.5 MG tablet Take 5 mg by mouth 3 (three) times a week.   Past Week at Unknown   metoprolol tartrate (LOPRESSOR) 100 MG tablet Take 1 tablet (100 mg total) by mouth 2 (two) times daily. 60 tablet 3 Past Week at Unknown   nitroGLYCERIN (NITROSTAT) 0.4 MG SL tablet Place 1 tablet (0.4 mg total) under the tongue every 5 (five) minutes as needed for chest pain. 30 tablet 12 Unknown at PRN   omeprazole (PRILOSEC) 40 MG capsule Take 40 mg by mouth daily.   Past Week at Unknown   oxyCODONE ER 9 MG C12A Take 9 mg by mouth 2 (two) times daily.   Past Week at Unknown   potassium chloride (KLOR-CON) 10 MEQ tablet Take 4 tablets (40 mEq total) by mouth 2 (two) times daily. And additional 66mq on metolazone days (Patient taking  differently: Take 40 mEq by mouth See admin instructions. Take 4 tablets (47mq) by mouth twice daily - take an additional 2 tablets (228m) by mouth when taking metolazone) 64 tablet 5 Past Week at Unknown   promethazine (PHENERGAN) 12.5 MG tablet Take 1 tablet (12.5 mg total) by mouth every 8 (eight) hours as needed for nausea or vomiting. (Patient taking differently: Take 12.5 mg by mouth 2 (two) times daily as needed for nausea or vomiting.) 30 tablet 0 Unknown at PRN   rivaroxaban (XARELTO) 20 MG TABS tablet Take 20 mg by mouth daily with supper.   Past Week at Unknown   rosuvastatin (CRESTOR) 10 MG tablet Take 10 mg by mouth daily.   Past Week at Unknown   Iron-FA-B Cmp-C-Biot-Probiotic (FUSION PLUS) CAPS Take 1 capsule by mouth daily. (Patient not taking: Reported on 09/16/2022) 30 capsule 3 Not Taking   Social History   Socioeconomic History   Marital status: Married    Spouse name: Not on  file   Number of children: Not on file   Years of education: Not on file   Highest education level: Not on file  Occupational History   Occupation: disabled  Tobacco Use   Smoking status: Every Day    Packs/day: 0.10    Types: Cigarettes   Smokeless tobacco: Never   Tobacco comments:    4/20 was not able to quit.  Still at 1-3 a day. She would like to wait to set a new quit date until we get back to class to have the extra in person support  Vaping Use   Vaping Use: Never used  Substance and Sexual Activity   Alcohol use: Never    Alcohol/week: 3.0 standard drinks of alcohol    Types: 3 Cans of beer per week    Comment: 16 oz per week   Drug use: Not Currently    Comment: Previous use of cocaine and marijuana last use 07/31/16    Sexual activity: Not Currently  Other Topics Concern   Not on file  Social History Narrative   Not on file   Social Determinants of Health   Financial Resource Strain: Low Risk  (10/02/2019)   Overall Financial Resource Strain (CARDIA)    Difficulty of Paying Living Expenses: Not hard at all  Food Insecurity: No Food Insecurity (09/18/2022)   Hunger Vital Sign    Worried About Running Out of Food in the Last Year: Never true    Ran Out of Food in the Last Year: Never true  Transportation Needs: No Transportation Needs (09/18/2022)   PRAPARE - TrHydrologistMedical): No    Lack of Transportation (Non-Medical): No  Physical Activity: Insufficiently Active (10/02/2019)   Exercise Vital Sign    Days of Exercise per Week: 7 days    Minutes of Exercise per Session: 20 min  Stress: No Stress Concern Present (10/02/2019)   [place]  Feeling of Stress : Not at all  Social Connections: Moderately Integrated (10/02/2019)   Social Connection and Isolation Panel [NHANES]    Frequency of Communication with Friends and Family: More than three times a week     Frequency of Social Gatherings with Friends and Family: More than three times a week    Attends Religious Services: 1 to 4 times per year    Active Member of [employer] Organizations: Yes    Attends ClArchivisteetings: 1  to 4 times per year    Marital Status: Never married  Intimate Partner Violence: Not At Risk (09/18/2022)   Humiliation, Afraid, Rape, and Kick questionnaire    Fear of Current or Ex-Partner: No    Emotionally Abused: No    Physically Abused: No    Sexually Abused: No    Family History  Problem Relation Age of Onset   COPD Mother    Cancer Mother        Bone   Asthma Mother    Rheum arthritis Mother    Congestive Heart Failure Father    Breast cancer Maternal Aunt      Vitals:   09/30/22 0200 09/30/22 0455 09/30/22 0503 09/30/22 0517  BP:  99/61  101/81  Pulse:  (!) 113 (!) 107 (!) 101  Resp:  20 20   Temp:  98 F (36.7 C)    TempSrc:  Oral    SpO2:   96%   Weight: 105.6 kg     Height:        PHYSICAL EXAM General: Chronically ill-appearing black female, sitting upright in hospital bed  HEENT:  Normocephalic and atraumatic. Neck:  + JVD.  Lungs: normal respiratory effort on O2 by [place]. Decreased breath sounds bilaterally without appreciable crackles or wheezes.   Heart: Tachycardic irregularly irregular rhythm grade 3/6 systolic rumbling murmur and soft S2. abdomen: Non-distended appearing with a large pannus.   Msk: generalized weakness, appears very deconditioned Extremities: No clubbing, cyanosis.  Trace to 1+ left lower extremity edema, no right lower extremity edema both feet are warm to touch  Neuro: alert and oriented x3 Psych: Appropriate for situation.  Labs: Basic Metabolic Panel: Recent Labs    09/28/22 0628 09/29/22 0344 09/30/22 0719  NA 137 138 138  K 3.7 3.3* 3.5  CL 90* 89* 89*  CO2 37* 39* 38*  GLUCOSE 80 78 105*  BUN 37* 35* 33*  CREATININE 1.26* 1.23* 1.13*  CALCIUM 8.7* 8.7* 8.9  MG 1.8  --  1.7  PHOS 3.0  --    --     Liver Function Tests: Recent Labs    09/29/22 0344 09/30/22 0719  AST 58* 61*  ALT 30 30  ALKPHOS 86 94  BILITOT 7.6* 7.7*  PROT 6.7 6.6  ALBUMIN 2.4* 2.5*    No results for input(s): "LIPASE", "AMYLASE" in the last 72 hours. CBC: Recent Labs    09/28/22 0628 09/29/22 0344  WBC 8.7 9.1  HGB 9.1* 9.3*  HCT 32.0* 32.4*  MCV 83.1 82.4  PLT 121* 124*    Cardiac Enzymes: No results for input(s): "CKTOTAL", "CKMB", "CKMBINDEX", "TROPONINIHS" in the last 72 hours. BNP: No results for input(s): "BNP" in the last 72 hours.  D-Dimer: No results for input(s): "DDIMER" in the last 72 hours. Hemoglobin A1C: No results for input(s): "HGBA1C" in the last 72 hours. Fasting Lipid Panel: No results for input(s): "CHOL", "HDL", "LDLCALC", "TRIG", "CHOLHDL", "LDLDIRECT" in the last 72 hours. Thyroid Function Tests: No results for input(s): "TSH", "T4TOTAL", "T3FREE", "THYROIDAB" in the last 72 hours.  Invalid input(s): "FREET3" Anemia Panel: No results for input(s): "VITAMINB12", "FOLATE", "FERRITIN", "TIBC", "IRON", "RETICCTPCT" in the last 72 hours.   Radiology: [facility] Chest Port 1 View  Result Date: 09/25/2022 CLINICAL DATA:  Shortness of breath, renal failure. EXAM: PORTABLE CHEST 1 VIEW COMPARISON:  Chest radiograph dated 09/16/2022. FINDINGS: The heart is enlarged. Mild-to-moderate bilateral interstitial and airspace opacities are redemonstrated. There is no pleural effusion or pneumothorax. Degenerative  changes are seen in the spine. A mitral valve prosthesis and atrial appendage clip, and median sternotomy wires are redemonstrated. IMPRESSION: Cardiomegaly with mild-to-moderate bilateral interstitial and airspace opacities, likely pulmonary edema. Electronically Signed   By: Zerita Boers M.D.   On: 09/25/2022 10:23   ECHOCARDIOGRAM LIMITED  Result Date: 09/23/2022    ECHOCARDIOGRAM LIMITED REPORT   Patient Name:   Chelsea Ramsey Date of Exam: 09/23/2022  Medical Rec #:  [id number]                 Height:       64.0 in Accession #:    [id number]                Weight:       243.4 lb Date of Birth:  [birth year]                 BSA:          2.127 m Patient Age:    66 years                  BP:           109/71 mmHg Patient Gender: F                         HR:           96 bpm. Exam Location:  [facility] Procedure: Limited Echo Indications:     CHF I50.9  History:         Patient has prior history of Echocardiogram examinations, most                  recent 09/17/2022. CHF, Prior Cardiac Surgery, COPD; Risk                  Factors:Hypertension.                   Mitral Valve: bioprosthetic valve valve is present in the                  mitral position.  Sonographer:     Sherrie Sport Referring Phys:  [id number] Tucker TANG Diagnosing Phys: Serafina Royals MD  Sonographer Comments: Technically difficult study due to poor echo windows, suboptimal apical window and suboptimal parasternal window. IMPRESSIONS  1. Left ventricular ejection fraction, by estimation, is 35 to 40%. The left ventricle has moderately decreased function. The left ventricle demonstrates global hypokinesis. The left ventricular internal cavity size was mildly dilated.  2. There is LV septal flattenting in systole and diastole and dilated hepatic vein consistant with severe phtn. Right ventricular systolic function is moderately reduced. The right ventricular size is moderately enlarged. Mildly increased right ventricular wall thickness. There is severely elevated pulmonary artery systolic pressure.  3. Left atrial size was severely dilated.  4. Right atrial size was severely dilated.  5. Cannot rule out mild stenosis. doppler difficult to interpret. The mitral valve is degenerative. Mild mitral valve regurgitation. 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  6. The tricuspid valve is abnormal. Tricuspid valve regurgitation is severe.  7. The aortic valve is calcified. Aortic valve  regurgitation is moderate. FINDINGS  Left Ventricle: Left ventricular ejection fraction, by estimation, is 35 to 40%. The left ventricle has moderately decreased function. The left ventricle demonstrates global hypokinesis. The left ventricular internal cavity size was mildly dilated. Right Ventricle: There is LV septal flattenting in  systole and diastole and dilated hepatic vein consistant with severe phtn. The right ventricular size is moderately enlarged. Mildly increased right ventricular wall thickness. Right ventricular systolic  function is moderately reduced. There is severely elevated pulmonary artery systolic pressure. Left Atrium: Left atrial size was severely dilated. Right Atrium: Right atrial size was severely dilated. Pericardium: There is no evidence of pericardial effusion. Mitral Valve: Cannot rule out mild stenosis. doppler difficult to interpret. The mitral valve is degenerative in appearance. Mild mitral valve regurgitation. 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 MV peak gradient, 19.2 mmHg. The mean mitral valve gradient is 11.0 mmHg. Tricuspid Valve: The tricuspid valve is abnormal. Tricuspid valve regurgitation is severe. Aortic Valve: The aortic valve is calcified. Aortic valve regurgitation is moderate. Pulmonic Valve: The pulmonic valve was normal in structure. Pulmonic valve regurgitation is mild. Aorta: The aortic root and ascending aorta are structurally normal, with no evidence of dilitation. IAS/Shunts: No atrial level shunt detected by color flow Doppler. LEFT VENTRICLE PLAX 2D LVIDd:         4.70 cm LVIDs:         3.10 cm LV PW:         0.90 cm LV IVS:        0.90 cm  RIGHT VENTRICLE RV Basal diam:  5.00 cm RV Mid diam:    6.50 cm LEFT ATRIUM         Index LA diam:    6.50 cm 3.06 cm/m   AORTA Ao Root diam: 2.90 cm MITRAL VALVE                TRICUSPID VALVE MV Area (PHT): 2.63 cm     TR Peak grad:   53.0 mmHg MV Peak grad:  19.2 mmHg    TR Vmax:         364.00 cm/s MV Mean grad:  11.0 mmHg MV Vmax:       2.19 m/s MV Vmean:      161.0 cm/s MV Decel Time: 288 msec MV E velocity: 239.00 cm/s Serafina Royals MD Electronically signed by Serafina Royals MD Signature Date/Time: 09/23/2022/4:02:55 PM    Final    ECHOCARDIOGRAM COMPLETE  Result Date: 09/17/2022    ECHOCARDIOGRAM REPORT   Patient Name:   Chelsea Ramsey Date of Exam: 09/17/2022 Medical Rec #:  [id number]                 Height:       64.0 in Accession #:    [id number]                Weight:       265.0 lb Date of Birth:  [birth year]                 BSA:          2.205 m Patient Age:    89 years                  BP:           93/79 mmHg Patient Gender: F                         HR:           110 bpm. Exam Location:  [facility] Procedure: 2D Echo, Color Doppler and Cardiac Doppler Indications:     I50.31 CHF-Acute Diastolic  History:  Patient has prior history of Echocardiogram examinations, most                  recent 11/05/2018. CHF, CAD, COPD; Risk Factors:Hypertension,                  Diabetes and Sleep Apnea.  Sonographer:     Charmayne Sheer Referring Phys:  [id number] Golden TANG Diagnosing Phys: Yolonda Kida MD  Sonographer Comments: Suboptimal apical window and no subcostal window. Image acquisition challenging due to patient body habitus and Image acquisition challenging due to COPD. IMPRESSIONS  1. Left ventricular ejection fraction, by estimation, is 20 to 25%. The left ventricle has severely decreased function. The left ventricle demonstrates global hypokinesis. Left ventricular diastolic parameters are consistent with Grade III diastolic dysfunction (restrictive). There is the interventricular septum is flattened in systole and diastole, consistent with right ventricular pressure and volume overload.  2. Right ventricular systolic function is moderately reduced. The right ventricular size is moderately enlarged. Mildly increased right ventricular wall thickness.  3. Left atrial  size was moderately dilated.  4. Right atrial size was severely dilated.  5. The mitral valve is abnormal. Mild to moderate mitral valve regurgitation.  6. Tricuspid valve regurgitation is severe.  7. The aortic valve is calcified. Aortic valve regurgitation is mild to moderate. Aortic valve sclerosis/calcification is present, without any evidence of aortic stenosis. Conclusion(s)/Recommendation(s): Poor windows for evaluation of left ventricular function by transthoracic echocardiography. Would recommend an alternative means of evaluation. FINDINGS  Left Ventricle: Left ventricular ejection fraction, by estimation, is 20 to 25%. The left ventricle has severely decreased function. The left ventricle demonstrates global hypokinesis. The left ventricular internal cavity size was normal in size. There is borderline concentric left ventricular hypertrophy. The interventricular septum is flattened in systole and diastole, consistent with right ventricular pressure and volume overload. Left ventricular diastolic parameters are consistent with Grade III diastolic dysfunction (restrictive). Right Ventricle: The right ventricular size is moderately enlarged. Mildly increased right ventricular wall thickness. Right ventricular systolic function is moderately reduced. Left Atrium: Left atrial size was moderately dilated. Right Atrium: Right atrial size was severely dilated. Pericardium: There is no evidence of pericardial effusion. Mitral Valve: The mitral valve is abnormal. Mild to moderate mitral valve regurgitation. Tricuspid Valve: The tricuspid valve is grossly normal. Tricuspid valve regurgitation is severe. Aortic Valve: The aortic valve is calcified. Aortic valve regurgitation is mild to moderate. Aortic regurgitation PHT measures 233 msec. Aortic valve sclerosis/calcification is present, without any evidence of aortic stenosis. Aortic valve mean gradient measures 6.0 mmHg. Aortic valve peak gradient measures 11.3  mmHg. Aortic valve area, by VTI measures 1.14 cm. Pulmonic Valve: The pulmonic valve was normal in structure. Pulmonic valve regurgitation is mild to moderate. Aorta: The ascending aorta was not well visualized. IAS/Shunts: No atrial level shunt detected by color flow Doppler.  LEFT VENTRICLE PLAX 2D LVIDd:         3.50 cm LVIDs:         3.20 cm LV PW:         1.20 cm LV IVS:        0.90 cm LVOT diam:     1.80 cm LV SV:         25 LV SV Index:   12 LVOT Area:     2.54 cm  RIGHT VENTRICLE RV Basal diam:  5.30 cm RV Mid diam:    5.00 cm RV S prime:     7.62  cm/s LEFT ATRIUM         Index       RIGHT ATRIUM           Index LA diam:    5.50 cm 2.49 cm/m  RA Area:     28.50 cm                                 RA Volume:   104.00 ml 47.17 ml/m  AORTIC VALVE                     PULMONIC VALVE AV Area (Vmax):    0.87 cm      PV Vmax:          0.98 m/s AV Area (Vmean):   0.90 cm      PV Vmean:         69.200 cm/s AV Area (VTI):     1.14 cm      PV VTI:           0.128 m AV Vmax:           168.00 cm/s   PV Peak grad:     3.9 mmHg AV Vmean:          116.000 cm/s  PV Mean grad:     2.5 mmHg AV VTI:            0.223 m       PR End Diast Vel: 10.24 msec AV Peak Grad:      11.3 mmHg AV Mean Grad:      6.0 mmHg LVOT Vmax:         57.60 cm/s LVOT Vmean:        40.900 cm/s LVOT VTI:          0.100 m LVOT/AV VTI ratio: 0.45 AI PHT:            233 msec  AORTA Ao Root diam: 3.10 cm MITRAL VALVE               TRICUSPID VALVE MV Area (PHT): 5.95 cm    TR Peak grad:   55.7 mmHg MV Decel Time: 128 msec    TR Vmax:        373.00 cm/s MV E velocity: 87.60 cm/s                            SHUNTS                            Systemic VTI:  0.10 m                            Systemic Diam: 1.80 cm Yolonda Kida MD Electronically signed by Yolonda Kida MD Signature Date/Time: 09/17/2022/4:54:34 PM    Final    DG Chest 1 View  Result Date: 09/16/2022 CLINICAL DATA:  Worsening shortness of breath. EXAM: CHEST  1 VIEW COMPARISON:   Chest radiograph 11/02/2020, chest CT 11/03/2020 FINDINGS: Prior median sternotomy with prosthetic aortic valve and left atrial clipping. Cardiomegaly which has increased from 2021. Slight globular configuration of the heart, query pericardial effusion. Suspect small right pleural effusion. Chronic bilateral lung opacities that appears stable. No acute airspace disease or pneumothorax. On limited assessment, no acute osseous findings.  IMPRESSION: 1. Increased cardiomegaly from 2021. Slight globular configuration of the heart, query pericardial effusion. 2. Suspect small right pleural effusion. 3. Stable chronic bilateral lung opacities. Electronically Signed   By: Keith Rake M.D.   On: 09/16/2022 17:21   MR ABDOMEN MRCP WO CONTRAST  Result Date: 09/16/2022 CLINICAL DATA:  56 year old female with history of jaundice. Nausea. EXAM: MRI ABDOMEN WITHOUT CONTRAST  (INCLUDING MRCP) TECHNIQUE: Multiplanar multisequence MR imaging of the abdomen was performed. Heavily T2-weighted images of the biliary and pancreatic ducts were obtained, and three-dimensional MRCP images were rendered by post processing. COMPARISON:  No prior abdominal MRI or MRCP. Abdominal ultrasound 09/15/2022. FINDINGS: Comment: Portions of today's examination are substantially limited by patient respiratory motion. The study is also severely limited for detection and characterization of visceral and/or vascular lesions by lack of IV gadolinium. Lower chest: Severe cardiomegaly. Massively distended inferior vena cava. Hepatobiliary: No definite suspicious cystic or solid hepatic lesions are confidently identified on today's motion limited noncontrast examination. No intra or extrahepatic biliary ductal dilatation noted on MRCP images. Common bile duct measures only 3 mm in the porta hepatis. No filling defect within the common bile duct to suggest choledocholithiasis. T1 hyperintense, T2 hypointense material lies dependently within the  gallbladder. Gallbladder does not appear overly distended. Accurate assessment for gallbladder wall thickening is limited on today's examination secondary to motion, but there may be some very mild gallbladder wall thickening and edema. No overt pericholecystic fluid or surrounding inflammatory changes. Pancreas: No definite pancreatic mass or peripancreatic fluid collections or inflammatory changes noted on today's noncontrast examination. No pancreatic ductal dilatation. Spleen:  Unremarkable. Adrenals/Urinary Tract: Unenhanced appearance of the kidneys and bilateral adrenal glands is normal. No hydroureteronephrosis in the visualized portions of the abdomen. Stomach/Bowel: Visualized portions are unremarkable. Vascular/Lymphatic: No aneurysm identified in the visualized abdominal vasculature. No lymphadenopathy noted in the abdomen. Other: No significant volume of ascites noted in the visualized portions of the peritoneal cavity. Musculoskeletal: No aggressive appearing osseous lesions are noted in the visualized portions of the skeleton. IMPRESSION: 1. Biliary sludge lying dependently in the gallbladder. Gallbladder wall may be mildly edematous, but is poorly evaluated on today's motion limited examination. If there is any clinical concern for acute cholecystitis, further evaluation with right upper quadrant abdominal ultrasound or nuclear medicine hepatobiliary scan should be considered at this time. 2. No biliary tract dilatation to suggest obstruction. No evidence of choledocholithiasis. 3. Severe cardiomegaly. Electronically Signed   By: Vinnie Langton M.D.   On: 09/16/2022 05:28   US ABDOMEN LIMITED RUQ (LIVER/GB)  Result Date: 09/15/2022 CLINICAL DATA:  Elevated bilirubin EXAM: ULTRASOUND ABDOMEN LIMITED RIGHT UPPER QUADRANT COMPARISON:  CT abdomen and pelvis 12/01/2018 FINDINGS: Gallbladder: Sludge is present within the gallbladder. Small amount of pericholecystic fluid. The gallbladder wall is  mildly thickened measuring 5 mm. Sonographic Murphy's sign was positive. Common bile duct: Diameter: 3 mm Liver: No focal lesion identified. Increased echogenicity. Portal vein is patent on Doppler imaging with bidirectional flow. Other: None. IMPRESSION: 1. Findings compatible with acute cholecystitis. 2. 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 Electronically Signed   By: Placido Sou M.D.   On: 09/15/2022 23:07    L/RHC 05/13/2018 Date of Procedure: 05/06/18  ___________________________________________________________________________  _  Findings:  1. No angiographically apparent coronary artery disease.  2. Elevated pulmonary capillary (23 mmHg)  3. Preserved cardiac output (6.2) and index (2.8)   Recommendations:  1. Intensive secondary prevention.  2. Follow  up with primary cardiologist.   Complications:   None  ___________________________________________________________________________  _  Referring Physician: Vella Raring, MD  Primary Cardiologist: Vella Raring, MD  Primary Care Physician: Kathee Delton   Performing Attending: Nena Polio, MD  Diagnostic Fellow: Lorrene Reid, MD  Interventional Fellow: NA   Procedures performed: Right Heart Catheterization and Coronary angiography  Access Site: Right Radial Artery  Arterial Closure: TR Band   Contrast Volume: 30 mL  Fluoroscopy Time: 6.3 mins  Radiation Dose: 372 mGy  ___________________________________________________________________________  _  HISTORY   56 yo female with history of HTN, moderate to severe MR and mild MS  (likely secondary to RHD), afib on [facility] with Xarelto who is scheduled for  surgical MVR on 05/30/18 and has been referred for [facility] for pre-operative  planning.     FINDINGS   Right Heart Catheterization   RA mean: 11 mmHg  RV: 63/1 (11) mmHg  PA (mean): 64/29 (44) mmHg  PCWP mean: 23 mmHg  PA Sat: 60 %  Art Sat: 95  %  Fick Cardiac output: 6.2 L/min  Fick Cardiac index: 2.8 L/min/m2   Hemodynamics and Coronary Angiography:    Aortic pressure: 104/73 mmHg   Coronary Angiography   Dominance: Right   Left main (LMCA): LMCA is a large-caliber vessel that originates from  the left coronary sinus. It bifurcates into the left anterior descending  (LAD) and left circumflex (LCx) arteries. There is no angiographic  evidence of significant disease in the LMCA.   Left anterior descending (LAD): The LAD is a large-caliber vessel that  gives off two diagonal (D) branches before it wraps around the apex. D1 is  a moderate caliber vessel with a high take-off. D2 is a small caliber  vessel. There is no angiographic evidence of significant disease in the  LAD.   Circumflex (LCx): The LCx is a medium-caliber vessel that gives off two  obtuse marginal (OM) branches and then continues as a small vessel in the  AV groove. OM1 is a small caliber vessel. OM2 is a moderate caliber  branching vessel. There is no angiographic evidence of significant disease  in the LCx.   Right Coronary (RCA): The RCA is a large-caliber vessel originating from  the right coronary sinus. It bifurcates distally into the posterior  descending artery (PDA) and three posterolateral branches (PL) consistent  with a right dominant system. There is no angiographic evidence of  significant disease in the RCA.   TELEMETRY reviewed by me (LT) 09/30/2022 : Atrial flutter with variable conduction rate 100s to 110s with paroxysms to the 120s before taking morning metoprolol  EKG reviewed by me: Atrial flutter with 4 1 conduction rate 84, QTc 470 on 10/23  Data reviewed by me (LT) 09/30/2022:  hospitalist progress note, PT notes, nursing notes, ordered CBC, reviewed CMP, INR, BNP, vitals, telemetry, I/O, ID note  Principal Problem:   Acute on chronic HFrEF (heart failure with reduced ejection fraction) (HCC) Active Problems:   A-fib (HCC)    HTN (hypertension)   Chronic low back pain (Primary Area of Pain) (Bilateral) w/ sciatica (Left)   Moderate to severe pulmonary hypertension (HCC)   Tobacco dependence   History of cocaine use   Iron deficiency anemia   Abdominal pain   Acute renal failure (HCC)   Elevated LFTs   Hypokalemia   Hypomagnesemia   Acute metabolic encephalopathy   Epistaxis   Positive QuantiFERON-TB Gold test   Obesity (BMI 30-39.9)  ASSESSMENT AND PLAN:  Chelsea Ramsey is a 72yoF with a PMH of HFrEF (current EF 35-40% G3 DD, global hypokinesis, previous EF >55% without MR or AR 2022), 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  Labs on admission showed acute renal failure (improved with diuresis), transaminitis, and hyperbilirubinemia, ultimately attributed to volume overload from her heart failure exacerbation.  Initial echo on 10/19 revealed a newly reduced LVEF with moderately reduced RV function, and severe TR. Hospital course complicated by atrial fibrillation with RVR and persistently elevated T. bili.  Ongoing primary complaint is generalized abdominal pain. Repeat limited echo on 10/25 after aggressive diuresis showed persistent dilation of hepatic vein, moderate RV enlargement, severely elevated PASP, and severe TR, c/w severe pulmonary hypertension.  #Positive QuantiFERON gold #Cough, hemoptysis -Cough and hemoptysis started overnight on 10/26, leukocytosis to 13,700 on 10/26, improved. She has been afebrile. - seen by ID, thinks hemoptysis could be from pulmonary HTN. - sputum cultures pending.   #severe pulmonary hypertension #RV dysfunction Seen on repeat echo 10/25 with severe TR, moderately  reduced RV function, severely elevated PASP. [place] in 2019 showed mildly elevated PA pressure 23 mmHg.  She was last seen by pulmonology in 04/2020 for her COPD/asthma but does not seem to be on therapy specific for pulmonary hypertension. Per chart review she had not been feeling well (abdominal pain, weakness) since the end of August after her upper endoscopy.  -We will continue diuresis as below -recommend outpatient follow up with pulmonology (established with Dr. Lanney Gins)  #Acute on chronic HFrEF (EF 35-40%, global hypokinesis and G3 DD, RV dilation and dysfunction, mild to moderate MR, AR) #Rheumatic mitral valve stenosis s/p MVR 2019 #Abdominal pain BNP on admission was elevated at 558.5 and uptrending to 1800 on 10/25 despite diuresis. Echo 10/19 shows significant reduction in EF 20-25% while in AF RVR from >55% 06/2021 and new AR and MR compared to prior study. Repeat on 10/25 while rate was better controlled and after diuresis was 35-40%. Her abdominal pain was ultimately attributed to volume overload/hepatic insufficiency after acute cholecystitis, choledocholithiasis, and a hematologic disorder were ruled out by general surgery, GI, and hematology.  Her volume status has much improved with aggressive diuresis, and feels "awesome" on 10/30.  She admitted to medication noncompliance on 10/30 once more alert and mental status that improved.  Suspect this led to her decompensation.  Labs trending towards euvolemia, remains with trace lower extremity edema, but overall volume status is much improved. -S/p IV Lasix 40 mg BID x 8 days.  -Start 60 mg p.o. Lasix today -S/p metolazone 5 mg x 1 10/24, 2.5x1 on 10/26.   -schedule metolazone 2.'5mg'$  PO on MWF  -Continue spironolactone 12.5 mg once daily -Continue potassium replacement 40 mEq twice daily -Continue midodrine 5 mg 3 times daily, favor continuing to decrease this as able -Will consider addition of ACEi/ARB/Entresto if her blood pressure  allows, still with relative hypotension requiring midodrine at 101/77 this afternoon. -Hold off on Jardiance for now, consider restarting on an outpatient basis. -Home diuretics are Lasix 40 p.o. once daily, metolazone 5 mg 3 times weekly with 40 mEq potassium twice daily and 60 mEq twice daily on days she takes metolazone.  #  Paroxysmal atrial fibrillation with RVR, now in atrial flutter Was previously managed well on metoprolol tartrate 100 mg twice daily.  Tachyarrhythmia on admission worsening her RV dysfunction. -S/p sotalol 80 mg once daily (started on 10/20) due to QTc prolongation (401 on 10/21, 470 on 10/23).  Not a great option with aggressive diuresis, persistent hypokalemia. -Intolerant of amiodarone (N/V), digoxin not initiated on admission due to acute renal failure -Continue metoprolol tartrate to '50mg'$  BID (home dose 100 mg twice daily)  -Discontinue diltiazem due to reduced EF and hypotension -Discussed with Dr. Clayborn Bigness who recommends dose reduced Xarelto at '15mg'$  (d/t hepatic dysfunction),  -Continue Xarelto -Continue rate control strategy, consider DCCV on an outpatient basis after 3 weeks of appropriate anticoagulation -Monitor and replete electrolytes to maintain a K >4, mag >2  #Hyperbilirubinemia s/p MRCP #Labile and elevated INR #Acute encephalopathy, resolved -t bili has remained elevated since admission, improving from 9.0 to 7.6 today.  -ammonia improved from 135 with lactulose to 49 on 10/25. -INR improved almost WNL at 1.4. -Encephalopathy felt to be related to opioid analgesics, which have now been discontinued. -Appreciate GI input.  #AKI Presented with a BUN/creatinine of 50/3.52 and GFR 15 -Renal function has normalized with diuresis -Daily CMP  She has been adequately diuresed, no further diagnostics necessary from a cardiac standpoint.  Follow-up with Dr. Saralyn Pilar in 1 to 2 weeks.  This patient's plan of care was discussed and created with Dr. Clayborn Bigness  and he is in agreement.  Signed: Tristan Schroeder , PA-C 09/30/2022, 11:02 AM [facility]

## 2022-09-30 NOTE — Progress Notes (Signed)
Nutrition Follow-up  DOCUMENTATION CODES:   Morbid obesity  INTERVENTION:   -Continue dysphagia 3 for ease of intake (pt with no teeth) -Continue Ensure Enlive po TID, each supplement provides 350 kcal and 20 grams of protein -Continue 30 ml Prosource Plus TID, each supplement provides 100 kcals and 15 grams protein -Continue MVI with minerals daily -Continue feeding assistance with meals   NUTRITION DIAGNOSIS:   Inadequate oral intake related to lethargy/confusion as evidenced by per patient/family report.  Ongoing  GOAL:   Patient will meet greater than or equal to 90% of their needs  Progressing   MONITOR:   PO intake, Supplement acceptance  REASON FOR ASSESSMENT:   Malnutrition Screening Tool    ASSESSMENT:   Pt with DM, pulm HTN, COPD, CHF, GERD, OSA was seen by PCP on day of admission for not feeling well and abd pain for about a month. Patient complained of pain in RUQ and right flank with nausea.    abnormal outpatient labs showing AKI.  Reviewed I/O's: -159 ml x 24 hours and -3.8 L since admission  UOP: 1.2 L x 24 hours   Pt unavailable at time of visit. Attempted to speak with pt via call to hospital room phone, however, unable to reach.   Pt with improved oral intake. Noted meal completions 25-75%. Pt is drinking oral nutrition supplements.   Pt approaching discharge soon. Plan to discharge home with help of family members.   Medications reviewed and include colace, lactulose, lasix, and potassium chloride.   Labs reviewed: CBGS: 99 (inpatient orders for glycemic control are none).    Diet Order:   Diet Order             DIET DYS 3 Room service appropriate? Yes; Fluid consistency: Thin  Diet effective now                   EDUCATION NEEDS:   Education needs have been addressed  Skin:  Skin Assessment: Reviewed RN Assessment  Last BM:  09/23/22 (type 6)  Height:   Ht Readings from Last 1 Encounters:  09/15/22 '5\' 4"'$  (1.626 m)     Weight:   Wt Readings from Last 1 Encounters:  09/30/22 105.6 kg    Ideal Body Weight:  54.5 kg  BMI:  Body mass index is 39.96 kg/m.  Estimated Nutritional Needs:   Kcal:  1900-2000  Protein:  105-120 grams  Fluid:  > 1.9 L    Loistine Chance, RD, LDN, [place] Registered Dietitian II Certified Diabetes Care and Education Specialist Please refer to [facility] for RD and/or RD on-call/weekend/after hours pager

## 2022-09-30 NOTE — Progress Notes (Signed)
PROGRESS NOTE    Chelsea Ramsey  WER:[id number] DOB: 1966/07/17 DOA: 09/15/2022 PCP: Emelia Loron, NP  Assessment & Plan:   Principal Problem:   Acute on chronic HFrEF (heart failure with reduced ejection fraction) (HCC) Active Problems:   Acute metabolic encephalopathy   Epistaxis   Abdominal pain   Elevated LFTs   Acute renal failure (HCC)   HTN (hypertension)   Iron deficiency anemia   A-fib (HCC)   Tobacco dependence   History of cocaine use   Chronic low back pain (Primary Area of Pain) (Bilateral) w/ sciatica (Left)   Hypokalemia   Hypomagnesemia   Moderate to severe pulmonary hypertension (HCC)   Positive QuantiFERON-TB Gold test   Obesity (BMI 30-39.9)  Assessment and Plan: Acute on chronic combined CHF: new onset HFrEF with EF of 20 to 25% and grade 3 diastolic dysfunction.  Continue on lasix, metoprolol, aldactone, metolazone as per cardio. F/u w/ cardio, Dr. Saralyn Pilar, in 1-2 weeks   Chronic hypoxic respiratory failure: continue on supplemental oxygen, uses 3-4 L [place] at home    Epistaxis: resolved   Acute metabolic encephalopathy: etiology unclear. Continue on lactulose, rifaximin. No hx of cirrhosis   Abdominal pain: etiology unclear. Work-up so far with MRCP shows biliary sludge, mildly edematous gallbladder, no biliary tract dilation to suggest obstruction.  No choledocholithiasis. No abd pain currently   Transaminitis: likely secondary to congestive hepatopathy. Labile. Severe hepatic steatosis seen on imaging.    AKI: Cr baseline around 1. Cr is trending down from day prior    HTN: continue on metoprolol, lasix, aldactone  Iron deficiency anemia: no need for a transfusion currently. 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   A-fib: likely PAF. Continue on metoprolol, xarelto    Tobacco dependence: continue on nicotine patch to prevent  w/drawal   Hx of cocaine use: UDS done on 09/16/2022 were positive for benzodiazepines, opioids and tricyclic.   Chronic low back pain: w/ sciatica. W/ history of chronic pain syndrome and fibromyalgia. Continue on home dose of oxycodone, gabapentin   Hypokalemia: WNL today    Hypomagnesemia: WNL today    Moderate to severe pulmonary hypertension: continue w/ supportive care   Obesity: BMI 39.9. Complicates overall care & prognosis   Positive QuantiFERON-TB Gold test: 3 AFBs currently pending.      DVT prophylaxis: xarelto  Code Status:  DNR Family Communication: called pt's mother, [name], no answer so I left a voicemail  Disposition Plan: likely d/c home w/ home health. Making sure all DME equipment has been delivered. Likely d/c home tomorrow   Level of care: Progressive  Status is: Inpatient Remains inpatient appropriate because: can likely d/c home tomorrow     Consultants:  Cardio   Procedures:  Antimicrobials:    Subjective: Pt c/o not sleeping well   Objective: Vitals:   09/30/22 0200 09/30/22 0455 09/30/22 0503 09/30/22 0517  BP:  99/61  101/81  Pulse:  (!) 113 (!) 107 (!) 101  Resp:  20 20   Temp:  98 F (36.7 C)    TempSrc:  Oral    SpO2:   96%   Weight: 105.6 kg     Height:        Intake/Output Summary (Last 24 hours) at 09/30/2022 0819 Last data filed at 09/29/2022 2330 Gross per 24 hour  Intake 1016.36 ml  Output 850 ml  Net 166.36 ml   [employer]  09/27/22 0313 09/28/22 0434 09/30/22 0200  Weight: 106 kg 104.8 kg 105.6 kg    Examination:  General exam: Appears calm and comfortable  Respiratory system: decreased breath sounds b/l  Cardiovascular system: irregularly irregular.  No rubs, gallops or clicks.  Gastrointestinal system: Abdomen is nondistended, soft and nontender. Normal bowel sounds heard. Central nervous system: Alert and awake. Moves all extremities  Psychiatry: Judgement and insight appears at baseline. Mood &  affect appropriate.     Data Reviewed: I have personally reviewed following labs and imaging studies  CBC: Recent Labs  Lab 09/25/22 0449 09/26/22 0314 09/27/22 0551 09/28/22 0628 09/29/22 0344  WBC 10.8* 11.9* 7.5 8.7 9.1  HGB 9.6* 9.3* 9.3* 9.1* 9.3*  HCT 34.6* 34.2* 33.1* 32.0* 32.4*  MCV 81.2 83.8 81.9 83.1 82.4  PLT 132* 118* 112* 121* 956*   Basic Metabolic Panel: Recent Labs  Lab 09/24/22 0427 09/24/22 1435 09/25/22 0449 09/25/22 1352 09/26/22 0314 09/27/22 0551 09/28/22 0628 09/29/22 0344  NA 147*  --  146*  --  142 139 137 138  K 3.1*   < > 3.0* 3.1* 3.0* 3.6 3.7 3.3*  CL 97*  --  97*  --  92* 91* 90* 89*  CO2 37*  --  38*  --  39* 38* 37* 39*  GLUCOSE 90  --  93  --  118* 77 80 78  BUN 33*  --  32*  --  36* 37* 37* 35*  CREATININE 1.27*  --  1.10*  --  1.52* 1.30* 1.26* 1.23*  CALCIUM 9.0  --  9.1  --  9.0 8.6* 8.7* 8.7*  MG 2.4  --  1.9  --  1.9 1.8 1.8  --   PHOS  --   --  2.8  --  3.1 2.7 3.0  --    < > = values in this interval not displayed.   GFR: Estimated Creatinine Clearance: 61.3 mL/min (A) (by C-G formula based on SCr of 1.23 mg/dL (H)). Liver Function Tests: Recent Labs  Lab 09/24/22 0427 09/25/22 0449 09/28/22 0628 09/29/22 0344  AST 47*  --  50* 58*  ALT 27  --  27 30  ALKPHOS 76  --  81 86  BILITOT 9.8* 9.0* 7.4* 7.6*  PROT 6.6  --  6.3* 6.7  ALBUMIN 2.5*  --  2.3* 2.4*   No results for input(s): "LIPASE", "AMYLASE" in the last 168 hours. Recent Labs  Lab 09/23/22 0850 09/25/22 0449 09/27/22 0551  AMMONIA 49* 53* 51*   Coagulation Profile: Recent Labs  Lab 09/24/22 0427 09/25/22 0449 09/26/22 0314 09/27/22 0551 09/29/22 0344  INR 1.8* 3.0* 1.8* 1.5* 1.4*   Cardiac Enzymes: No results for input(s): "CKTOTAL", "CKMB", "CKMBINDEX", "TROPONINI" in the last 168 hours. BNP (last 3 results) No results for input(s): "PROBNP" in the last 8760 hours. HbA1C: No results for input(s): "HGBA1C" in the last 72  hours. CBG: No results for input(s): "GLUCAP" in the last 168 hours. Lipid Profile: No results for input(s): "CHOL", "HDL", "LDLCALC", "TRIG", "CHOLHDL", "LDLDIRECT" in the last 72 hours. Thyroid Function Tests: No results for input(s): "TSH", "T4TOTAL", "FREET4", "T3FREE", "THYROIDAB" in the last 72 hours. Anemia Panel: No results for input(s): "VITAMINB12", "FOLATE", "FERRITIN", "TIBC", "IRON", "RETICCTPCT" in the last 72 hours. Sepsis Labs: No results for input(s): "PROCALCITON", "LATICACIDVEN" in the last 168 hours.  No results found for this or any previous visit (from the past 240 hour(s)).       Radiology Studies: No  results found.      Scheduled Meds:  (feeding supplement) PROSource Plus  30 mL Oral TID BM   acidophilus  1 capsule Oral Daily   allopurinol  100 mg Oral Daily   dextromethorphan-guaiFENesin  1 tablet Oral BID   docusate sodium  100 mg Oral BID   feeding supplement  237 mL Oral TID BM   furosemide  60 mg Oral Daily   gabapentin  300 mg Oral BID   lactulose  30 g Oral BID   metolazone  2.5 mg Oral Once per day on Mon Wed Fri   metoprolol tartrate  50 mg Oral BID   midodrine  5 mg Oral TID WC   multivitamin with minerals  1 tablet Oral Daily   pantoprazole  40 mg Oral BID   rifaximin  550 mg Oral BID   rivaroxaban  15 mg Oral Daily   sodium chloride flush  3 mL Intravenous Q12H   sodium chloride flush  3 mL Intravenous Q12H   spironolactone  12.5 mg Oral Daily   Continuous Infusions:  sodium chloride       LOS: 14 days    Time spent: 30 mins     Wyvonnia Dusky, MD [facility] Pager [phone]  If 7PM-7AM, please contact night-coverage [email or web address] 09/30/2022, 8:19 AM

## 2022-09-30 NOTE — Progress Notes (Signed)
Heart Failure Nurse Navigator Note  It was supplied with a scale from [facility] for doing her daily weights at home.  She had no further questions.  Pricilla Riffle RN CHFN

## 2022-10-01 DIAGNOSIS — J9611 Chronic respiratory failure with hypoxia: Secondary | ICD-10-CM | POA: Diagnosis not present

## 2022-10-01 DIAGNOSIS — N179 Acute kidney failure, unspecified: Secondary | ICD-10-CM | POA: Diagnosis not present

## 2022-10-01 DIAGNOSIS — I5023 Acute on chronic systolic (congestive) heart failure: Secondary | ICD-10-CM | POA: Diagnosis not present

## 2022-10-01 LAB — COMPREHENSIVE METABOLIC PANEL
ALT: 34 U/L (ref 0–44)
AST: 63 U/L — ABNORMAL HIGH (ref 15–41)
Albumin: 2.4 g/dL — ABNORMAL LOW (ref 3.5–5.0)
Alkaline Phosphatase: 89 U/L (ref 38–126)
Anion gap: 12 (ref 5–15)
BUN: 36 mg/dL — ABNORMAL HIGH (ref 6–20)
CO2: 37 mmol/L — ABNORMAL HIGH (ref 22–32)
Calcium: 9.1 mg/dL (ref 8.9–10.3)
Chloride: 89 mmol/L — ABNORMAL LOW (ref 98–111)
Creatinine, Ser: 1.28 mg/dL — ABNORMAL HIGH (ref 0.44–1.00)
GFR, Estimated: 49 mL/min — ABNORMAL LOW (ref >60)
Glucose, Bld: 78 mg/dL (ref 70–99)
Potassium: 4.4 mmol/L (ref 3.5–5.1)
Sodium: 138 mmol/L (ref 135–145)
Total Bilirubin: 7.8 mg/dL — ABNORMAL HIGH (ref 0.3–1.2)
Total Protein: 6.7 g/dL (ref 6.5–8.1)

## 2022-10-01 LAB — CBC
HCT: 31.8 % — ABNORMAL LOW (ref 36.0–46.0)
Hemoglobin: 9.1 g/dL — ABNORMAL LOW (ref 12.0–15.0)
MCH: 24.4 pg — ABNORMAL LOW (ref 26.0–34.0)
MCHC: 28.6 g/dL — ABNORMAL LOW (ref 30.0–36.0)
MCV: 85.3 fL (ref 80.0–100.0)
Platelets: 132 10*3/uL — ABNORMAL LOW (ref 150–400)
RBC: 3.73 MIL/uL — ABNORMAL LOW (ref 3.87–5.11)
RDW: 25.8 % — ABNORMAL HIGH (ref 11.5–15.5)
WBC: 7.6 10*3/uL (ref 4.0–10.5)
nRBC: 0.5 % — ABNORMAL HIGH (ref 0.0–0.2)

## 2022-10-01 MED ORDER — FUROSEMIDE 20 MG PO TABS: 60.0000 mg | ORAL_TABLET | Freq: Every day | ORAL | 0 refills | Status: AC

## 2022-10-01 MED ORDER — METOPROLOL TARTRATE 50 MG PO TABS: 50.0000 mg | ORAL_TABLET | Freq: Two times a day (BID) | ORAL | 0 refills | Status: AC

## 2022-10-01 MED ORDER — METOLAZONE 2.5 MG PO TABS: 2.5000 mg | ORAL_TABLET | ORAL | 0 refills | Status: AC

## 2022-10-01 MED ORDER — DIAZEPAM 5 MG PO TABS
10.0000 mg | ORAL_TABLET | Freq: Two times a day (BID) | ORAL | Status: DC | PRN
  Administered 2022-10-01: 10 mg via ORAL
  Filled 2022-10-01: qty 2

## 2022-10-01 MED ORDER — POTASSIUM CHLORIDE CRYS ER 20 MEQ PO TBCR
40.0000 meq | EXTENDED_RELEASE_TABLET | Freq: Every day | ORAL | Status: DC
  Administered 2022-10-01: 40 meq via ORAL
  Filled 2022-10-01: qty 2

## 2022-10-01 MED ORDER — SPIRONOLACTONE 25 MG PO TABS: 12.5000 mg | ORAL_TABLET | Freq: Every day | ORAL | 0 refills | Status: AC

## 2022-10-01 MED ORDER — RIFAXIMIN 550 MG PO TABS: 550.0000 mg | ORAL_TABLET | Freq: Two times a day (BID) | ORAL | 0 refills | Status: AC

## 2022-10-01 MED ORDER — LACTULOSE 10 GM/15ML PO SOLN: 30.0000 g | Freq: Two times a day (BID) | ORAL | 0 refills | Status: AC

## 2022-10-01 NOTE — Plan of Care (Signed)

## 2022-10-01 NOTE — Progress Notes (Signed)
This RN attempted to call Mother as requested when patient was on the way. No answer  Thorough report given to EMS prior to transport. All belongings packed and taken with patient.    Patient transported via stretcher with 2 EMS personnel.

## 2022-10-01 NOTE — Discharge Summary (Signed)
Physician Discharge Summary  Delancey Moraes BSJ:[id number] DOB: [birth year] DOA: 09/15/2022  PCP: Emelia Loron, NP  Admit date: 09/15/2022 Discharge date: 10/01/2022  Admitted From: home  Disposition:  home w/ home health   Recommendations for Outpatient Follow-up:  Follow up with PCP in 1-2 weeks F/u w/ cardio, Dr. Saralyn Pilar, in 1-2 weeks  F/u w/ GI., Dr. Alice Reichert, in 1-2 weeks  Home Health: yes  Equipment/Devices: uses 3-4L [place] chronically   Discharge Condition: stable  CODE STATUS: DNR  Diet recommendation: Heart Healthy / Carb Modified   Brief/Interim Summary: HPI was taken from Dr. Florina Ou: Tenille Morrill is an 56 y.o. female  Seen by pcp today for not feeling well and abd pain for about a month per patient.  Her pcp called and advised she come to ed for abnormal labs.  Pt states she has nausea and pain has been getting worse  and shows the RUQ / Rt flank/ Rt side of abdomen involved.  Pt otherwise does not report any complaints. Continues to smoke.  As per Dr. Leslye Peer: Taken from prior notes.   Patient is 56 year old female with DM, pulm HTN, COPD, CHF, GERD, OSA was seen by PCP on day of admission for not feeling well and abd pain for about a month. Patient complained of pain in RUQ and right flank with nausea.   abnormal outpatient labs showing AKI.   In ED, her labwork confirmed AKI with Cr 3.54, total bilirubin of 9.6.  AST mildly elevated to 57, ALT 35, Alk Phos 80.  U/S was done which showed gallbladder wall thickening to 5 mm with pericholecystic fluid, suspicious for acute cholecystitis.  CBD per U/S was 3 mm.   Surgery was consulted, recommended MRCP - MRCP showed biliary sludge, GB mild edematous. No biliary tract dilation to suggest obstruction.  No choledocholithiasis. 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   Also found to have acute systolic on chronic diastolic heart failure.  Echocardiogram shows EF of 20 to 25% with grade 3 diastolic dysfunction, right ventricular pressure and volume overload.  Prior echo done in December 2019 with a EF of 50 to 55%.  She was started on IV diuresis.   As blood pressure remained soft which interfere with other medical management, she was started on midodrine.  Remained in A-fib with intermittently going in RVR.  Missed Cardizem doses yesterday due to softer blood pressure.  Patient was on heparin infusion for the past 5 days, do not see any plan for cardiac cath by cardiology.  No clear recommendations. CMP remained with elevated T. bili at 9.1, she continued to have upper abdominal pain. Concern of cardiohepatic with hepatic congestion secondary to heart failure. Secure chat with Dr. Humphrey Rolls and we will discontinue heparin infusion today and restart her home Xarelto.  Patient will get benefit from ischemic work-up with this new HFrEF. Norco switched with oxycodone immediate release to see if that will better serve her pain. No recorded urinary output as she was receiving IV diuresis-message sent to nursing staff. Potassium at 3.1 which is being repleted.  Renal functions continue to improve slowly.   10/23: Discussed with Dr. Nehemiah Massed on chat, there is no need for immediate ischemic work-up. T. bili started improving, at 8.5 today.  Rest of the  labs improved.  Renal function stable. Pain with better control after changing to oxycodone.  Awaiting disposition.   10/24: Patient became more lethargic and somnolent in the evening, ABG and ammonia levels were checked and found to have CO2 of 59 and ammonia of 135.  Had an episode of self-limiting epistaxis followed by coughing up some blood overnight.  Patient is on Xarelto for atrial  flutter, which is being held. Lactulose enema was added as she is unable to take much p.o. Remained very lethargic and somnolent this morning.  CMP with improvement in creatinine, T. bili at 8.8 with INR of 3.6.  Concern of hepatic failure, another message sent to GI.  Recent liver ultrasound with hepatic steatosis and portal hypertension, no cirrhosis.  VBG was mostly within normal limit except mildly elevated bicarb. Repeat ammonia levels with some improvement to 65. Currently unable to swallow as pocketing in her mouth, able to answer orientation questions appropriately but response was very slow. Palliative care consult was also placed.   10/25: Patient little more alert and oriented.  Had multiple bowel movements with lactulose enema yesterday, ammonia at 49.  Worsening BNP at 12/07/2001 today, significant hypokalemia with potassium of 2.2, which is being repleted.  Magnesium at 1.9.  Worsening of T. bili at 9.8 And some improvement of creatinine to 1.25.  Cardiology ordered another limited echocardiogram for reevaluation of her volume status and heart failure. GI started her on rifaximin.  She also received vitamin K with some improvement of INR to 2.5 today.   Patient will remain very high risk for deterioration and mortality.  Currently DNR and palliative team is following.     10/31.  Discontinued Foley for voiding trial.  Switching heparin drip over to Xarelto lower dose.  Switching Lasix from IV to oral.  TOC working on delivering equipment to home.   As per Dr. Jimmye Norman 11/1-11/2/23: Pt will continue on metolazone (qMWF), lasix, metoprolol & aldactone at d/c as per cardio. Pt will need to f/u w/ cardio, Dr. Saralyn Pilar, in 1-2 weeks. Pt was medically stable for d/c 11/1 but waiting on DME equipment to be delivered to pt's mother's house prior to d/c. PT/OT evaluated the pt and recommended SNF but pt refused and agreed to home w/ home health. For more information, please see previous  progress/consult notes.    Discharge Diagnoses:  Principal Problem:   Acute on chronic HFrEF (heart failure with reduced ejection fraction) (HCC) Active Problems:   Acute metabolic encephalopathy   Epistaxis   Abdominal pain   Elevated LFTs   Acute renal failure (HCC)   HTN (hypertension)   Iron deficiency anemia   A-fib (HCC)   Tobacco dependence   History of cocaine use   Chronic low back pain (Primary Area of Pain) (Bilateral) w/ sciatica (Left)   Hypokalemia   Hypomagnesemia   Moderate to severe pulmonary hypertension (HCC)   Positive QuantiFERON-TB Gold test   Obesity (BMI 30-39.9)  Acute on chronic combined CHF: new onset HFrEF with EF of 20 to 25% and grade 3 diastolic dysfunction.  Continue on lasix, metoprolol, aldactone, metolazone as per cardio. F/u w/ cardio, Dr. Saralyn Pilar, in 1-2 weeks   Chronic hypoxic respiratory failure: continue on supplemental oxygen, uses 3-4 L [place] at home    Epistaxis: resolved   Acute metabolic encephalopathy: etiology unclear. Continue on lactulose, rifaximin. No hx of cirrhosis   Abdominal pain: etiology unclear. Work-up so far with MRCP shows biliary sludge, mildly edematous gallbladder, no biliary tract  dilation to suggest obstruction.  No choledocholithiasis. No abd pain currently   Transaminitis: likely secondary to congestive hepatopathy. Labile. Severe hepatic steatosis seen on imaging.    AKI: Cr baseline around 1. Cr is labile    HTN: continue on metoprolol, lasix, aldactone  Iron deficiency anemia: no need for a transfusion currently. 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   A-fib: likely PAF. Continue on metoprolol, xarelto    Tobacco dependence: continue on nicotine patch to prevent w/drawal   Hx of cocaine use: UDS done on 09/16/2022 were positive for benzodiazepines, opioids and tricyclic.   Chronic low back pain: w/  sciatica. W/ history of chronic pain syndrome and fibromyalgia. Continue on home dose of oxycodone, gabapentin   Hypokalemia: WNL today    Hypomagnesemia: WNL today    Moderate to severe pulmonary hypertension: continue w/ supportive care   Obesity: BMI 39.9. Complicates overall care & prognosis   Positive QuantiFERON-TB Gold test: 3 AFBs currently pending.  Discharge Instructions  Discharge Instructions     Diet - low sodium heart healthy   Complete by: As directed    Dysphagia III diet   Discharge instructions   Complete by: As directed    F/u w/ PCP in 1-2 weeks. F/u w/ cardio, Dr. Saralyn Pilar, in 1-2 weeks. F/u w/ GI, Dr. Alice Reichert, in 1-2 weeks   Increase activity slowly   Complete by: As directed       Allergies as of 10/01/2022       Reactions   Amiodarone Nausea And Vomiting   Aspirin Swelling   Flexeril [cyclobenzaprine] Swelling   Trazamine [trazodone & Diet Manage Prod] Nausea And Vomiting   Codeine Rash   Tramadol Rash        Medication List     TAKE these medications    albuterol 108 (90 Base) MCG/ACT inhaler Commonly known as: VENTOLIN HFA Inhale 1-2 puffs into the lungs every 6 (six) hours as needed for wheezing or shortness of breath.   allopurinol 100 MG tablet Commonly known as: ZYLOPRIM Take 1 tablet (100 mg total) by mouth 2 (two) times daily.   colchicine 0.6 MG tablet Take 1 tablet (0.6 mg total) by mouth daily. As directed   diazepam 10 MG tablet Commonly known as: VALIUM Take 10 mg by mouth 2 (two) times daily as needed for anxiety.   docusate sodium 100 MG capsule Commonly known as: COLACE Take 100 mg by mouth 2 (two) times daily as needed for mild constipation or moderate constipation.   empagliflozin 10 MG Tabs tablet Commonly known as: JARDIANCE Take 10 mg by mouth daily.   furosemide 20 MG tablet Commonly known as: LASIX Take 3 tablets (60 mg total) by mouth daily. Start taking on: October 02, 2022 What changed:   medication strength how much to take how to take this when to take this additional instructions   Fusion Plus Caps Take 1 capsule by mouth daily.   gabapentin 600 MG tablet Commonly known as: NEURONTIN Take 0.5 tablets (300 mg total) by mouth 2 (two) times daily. What changed:  how much to take when to take this   ipratropium-albuterol 0.5-2.5 (3) MG/3ML Soln Commonly known as: DUONEB INHALE THE CONTENTS OF 1 VIAL(3MLS) VIA NEBULIZER 3 TIMES DAILY AS NEEDED   lactulose 10 GM/15ML solution Commonly known as: CHRONULAC Take 45 mLs (30 g total) by mouth 2 (two) times daily.   metolazone 2.5  MG tablet Commonly known as: ZAROXOLYN Take 1 tablet (2.5 mg total) by mouth every Monday, Wednesday, and Friday. Start taking on: October 02, 2022 What changed:  how much to take when to take this   metoprolol tartrate 50 MG tablet Commonly known as: LOPRESSOR Take 1 tablet (50 mg total) by mouth 2 (two) times daily. What changed:  medication strength how much to take   nitroGLYCERIN 0.4 MG SL tablet Commonly known as: NITROSTAT Place 1 tablet (0.4 mg total) under the tongue every 5 (five) minutes as needed for chest pain.   omeprazole 40 MG capsule Commonly known as: PRILOSEC Take 40 mg by mouth daily.   oxyCODONE ER 9 MG C12a Take 9 mg by mouth 2 (two) times daily.   potassium chloride 10 MEQ tablet Commonly known as: KLOR-CON Take 4 tablets (40 mEq total) by mouth 2 (two) times daily. And additional 107mq on metolazone days What changed:  when to take this additional instructions   promethazine 12.5 MG tablet Commonly known as: PHENERGAN Take 1 tablet (12.5 mg total) by mouth every 8 (eight) hours as needed for nausea or vomiting. What changed: when to take this   rifaximin 550 MG Tabs tablet Commonly known as: XIFAXAN Take 1 tablet (550 mg total) by mouth 2 (two) times daily.   rivaroxaban 20 MG Tabs tablet Commonly known as: XARELTO Take 20 mg by mouth daily  with supper.   rosuvastatin 10 MG tablet Commonly known as: CRESTOR Take 10 mg by mouth daily.   spironolactone 25 MG tablet Commonly known as: ALDACTONE Take 0.5 tablets (12.5 mg total) by mouth daily. Start taking on: October 02, 2022   Vitamin D 50 MCG (2000 UT) Caps Take 1 capsule (2,000 Units total) by mouth daily.               Durable Medical Equipment  (From admission, onward)           Start     Ordered   09/28/22 1347  For home use only DME Hospital bed  Once       Question Answer Comment  Length of Need Lifetime   The above medical condition requires: Patient requires the ability to reposition frequently   Head must be elevated greater than: 30 degrees   Bed type Semi-electric   Hoyer Lift Yes   Support Surface: Gel Overlay      09/28/22 1347   09/28/22 1347  For home use only DME 3 n 1  Once        09/28/22 1347   09/28/22 1347  For home use only DME standard manual wheelchair with seat cushion  Once       Comments: Patient suffers from Hypoxia, CHF which impairs their ability to perform daily activities like bathing in the home.  A walker will not resolve issue with performing activities of daily living. A wheelchair will allow patient to safely perform daily activities. Patient can safely propel the wheelchair in the home or has a caregiver who can provide assistance. Length of need Lifetime. Accessories: elevating leg rests (ELRs), wheel locks, extensions and anti-tippers.   09/28/22 1347            Follow-up Information     Paraschos, ASheppard Coil MD. Go in 2 week(s).   Specialty: Cardiology Contact information: [facility] [phone]        [facility]   Specialty: Home Health  Services Why: They will follow up with you for your home health needs. Contact information: [address] [place] [address] [phone]          Efrain Sella, MD Follow up.   Specialty: Gastroenterology Why: F/u in 1-2 weeks Contact information: [place] [address] [phone]         Emelia Loron, NP Follow up.   Specialty: Nurse Practitioner Why: F/u in 1-2 weeks Contact information: [place] [address] [phone]                Allergies  Allergen Reactions   Amiodarone Nausea And Vomiting   Aspirin Swelling   Flexeril [Cyclobenzaprine] Swelling   Trazamine [Trazodone & Diet Manage Prod] Nausea And Vomiting   Codeine Rash   Tramadol Rash    Consultations: Cardio  ID GI heme   Procedures/Studies: DG Chest Port 1 View  Result Date: 09/25/2022 CLINICAL DATA:  Shortness of breath, renal failure. EXAM: PORTABLE CHEST 1 VIEW COMPARISON:  Chest radiograph dated 09/16/2022. FINDINGS: The heart is enlarged. Mild-to-moderate bilateral interstitial and airspace opacities are redemonstrated. There is no pleural effusion or pneumothorax. Degenerative changes are seen in the spine. A mitral valve prosthesis and atrial appendage clip, and median sternotomy wires are redemonstrated. IMPRESSION: Cardiomegaly with mild-to-moderate bilateral interstitial and airspace opacities, likely pulmonary edema. Electronically Signed   By: Zerita Boers M.D.   On: 09/25/2022 10:23   ECHOCARDIOGRAM LIMITED  Result Date: 09/23/2022    ECHOCARDIOGRAM LIMITED REPORT   Patient Name:   ZOYA SPRECHER Date of Exam: 09/23/2022 Medical Rec #:  [id number]                 Height:       64.0 in Accession #:    [id number]                Weight:       243.4 lb Date of Birth:  12/09/1965                 BSA:          2.127 m Patient Age:    56 years                  BP:           109/71 mmHg Patient Gender: F                         HR:           96 bpm. Exam Location:  [facility] Procedure: Limited Echo Indications:     CHF I50.9  History:         Patient has prior history of  Echocardiogram examinations, most                  recent 09/17/2022. CHF, Prior Cardiac Surgery, COPD; Risk                  Factors:Hypertension.                   Mitral Valve: bioprosthetic valve valve is present in the                  mitral position.  Sonographer:     Sherrie Sport Referring Phys:  [id number] [place] TANG Diagnosing Phys: Serafina Royals MD  Sonographer Comments: Technically difficult study due to poor echo windows,  suboptimal apical window and suboptimal parasternal window. IMPRESSIONS  1. Left ventricular ejection fraction, by estimation, is 35 to 40%. The left ventricle has moderately decreased function. The left ventricle demonstrates global hypokinesis. The left ventricular internal cavity size was mildly dilated.  2. There is LV septal flattenting in systole and diastole and dilated hepatic vein consistant with severe phtn. Right ventricular systolic function is moderately reduced. The right ventricular size is moderately enlarged. Mildly increased right ventricular wall thickness. There is severely elevated pulmonary artery systolic pressure.  3. Left atrial size was severely dilated.  4. Right atrial size was severely dilated.  5. Cannot rule out mild stenosis. doppler difficult to interpret. The mitral valve is degenerative. Mild mitral valve regurgitation. 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  6. The tricuspid valve is abnormal. Tricuspid valve regurgitation is severe.  7. The aortic valve is calcified. Aortic valve regurgitation is moderate. FINDINGS  Left Ventricle: Left ventricular ejection fraction, by estimation, is 35 to 40%. The left ventricle has moderately decreased function. The left ventricle demonstrates global hypokinesis. The left ventricular internal cavity size was mildly dilated. Right Ventricle: There is LV septal flattenting in systole and diastole and dilated hepatic vein consistant with severe phtn. The right ventricular size is  moderately enlarged. Mildly increased right ventricular wall thickness. Right ventricular systolic  function is moderately reduced. There is severely elevated pulmonary artery systolic pressure. Left Atrium: Left atrial size was severely dilated. Right Atrium: Right atrial size was severely dilated. Pericardium: There is no evidence of pericardial effusion. Mitral Valve: Cannot rule out mild stenosis. doppler difficult to interpret. The mitral valve is degenerative in appearance. Mild mitral valve regurgitation. 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 MV peak gradient, 19.2 mmHg. The mean mitral valve gradient is 11.0 mmHg. Tricuspid Valve: The tricuspid valve is abnormal. Tricuspid valve regurgitation is severe. Aortic Valve: The aortic valve is calcified. Aortic valve regurgitation is moderate. Pulmonic Valve: The pulmonic valve was normal in structure. Pulmonic valve regurgitation is mild. Aorta: The aortic root and ascending aorta are structurally normal, with no evidence of dilitation. IAS/Shunts: No atrial level shunt detected by color flow Doppler. LEFT VENTRICLE PLAX 2D LVIDd:         4.70 cm LVIDs:         3.10 cm LV PW:         0.90 cm LV IVS:        0.90 cm  RIGHT VENTRICLE RV Basal diam:  5.00 cm RV Mid diam:    6.50 cm LEFT ATRIUM         Index LA diam:    6.50 cm 3.06 cm/m   AORTA Ao Root diam: 2.90 cm MITRAL VALVE                TRICUSPID VALVE MV Area (PHT): 2.63 cm     TR Peak grad:   53.0 mmHg MV Peak grad:  19.2 mmHg    TR Vmax:        364.00 cm/s MV Mean grad:  11.0 mmHg MV Vmax:       2.19 m/s MV Vmean:      161.0 cm/s MV Decel Time: 288 msec MV E velocity: 239.00 cm/s Serafina Royals MD Electronically signed by Serafina Royals MD Signature Date/Time: 09/23/2022/4:02:55 PM    Final    ECHOCARDIOGRAM COMPLETE  Result Date: 09/17/2022    ECHOCARDIOGRAM REPORT   Patient Name:   [facility]  Date of Exam: 09/17/2022 Medical Rec #:  [id number]                  Height:       64.0 in Accession #:    [id number]                Weight:       265.0 lb Date of Birth:  07-22-1966                 BSA:          2.205 m Patient Age:    59 years                  BP:           93/79 mmHg Patient Gender: F                         HR:           110 bpm. Exam Location:  [facility] Procedure: 2D Echo, Color Doppler and Cardiac Doppler Indications:     I50.31 CHF-Acute Diastolic  History:         Patient has prior history of Echocardiogram examinations, most                  recent 11/05/2018. CHF, CAD, COPD; Risk Factors:Hypertension,                  Diabetes and Sleep Apnea.  Sonographer:     Charmayne Sheer Referring Phys:  [id number] [place] TANG Diagnosing Phys: Yolonda Kida MD  Sonographer Comments: Suboptimal apical window and no subcostal window. Image acquisition challenging due to patient body habitus and Image acquisition challenging due to COPD. IMPRESSIONS  1. Left ventricular ejection fraction, by estimation, is 20 to 25%. The left ventricle has severely decreased function. The left ventricle demonstrates global hypokinesis. Left ventricular diastolic parameters are consistent with Grade III diastolic dysfunction (restrictive). There is the interventricular septum is flattened in systole and diastole, consistent with right ventricular pressure and volume overload.  2. Right ventricular systolic function is moderately reduced. The right ventricular size is moderately enlarged. Mildly increased right ventricular wall thickness.  3. Left atrial size was moderately dilated.  4. Right atrial size was severely dilated.  5. The mitral valve is abnormal. Mild to moderate mitral valve regurgitation.  6. Tricuspid valve regurgitation is severe.  7. The aortic valve is calcified. Aortic valve regurgitation is mild to moderate. Aortic valve sclerosis/calcification is present, without any evidence of aortic stenosis. Conclusion(s)/Recommendation(s): Poor windows for evaluation of left  ventricular function by transthoracic echocardiography. Would recommend an alternative means of evaluation. FINDINGS  Left Ventricle: Left ventricular ejection fraction, by estimation, is 20 to 25%. The left ventricle has severely decreased function. The left ventricle demonstrates global hypokinesis. The left ventricular internal cavity size was normal in size. There is borderline concentric left ventricular hypertrophy. The interventricular septum is flattened in systole and diastole, consistent with right ventricular pressure and volume overload. Left ventricular diastolic parameters are consistent with Grade III diastolic dysfunction (restrictive). Right Ventricle: The right ventricular size is moderately enlarged. Mildly increased right ventricular wall thickness. Right ventricular systolic function is moderately reduced. Left Atrium: Left atrial size was moderately dilated. Right Atrium: Right atrial size was severely dilated. Pericardium: There is no evidence of pericardial effusion. Mitral Valve: The mitral valve is abnormal. Mild to moderate mitral valve regurgitation. Tricuspid Valve: The tricuspid  valve is grossly normal. Tricuspid valve regurgitation is severe. Aortic Valve: The aortic valve is calcified. Aortic valve regurgitation is mild to moderate. Aortic regurgitation PHT measures 233 msec. Aortic valve sclerosis/calcification is present, without any evidence of aortic stenosis. Aortic valve mean gradient measures 6.0 mmHg. Aortic valve peak gradient measures 11.3 mmHg. Aortic valve area, by VTI measures 1.14 cm. Pulmonic Valve: The pulmonic valve was normal in structure. Pulmonic valve regurgitation is mild to moderate. Aorta: The ascending aorta was not well visualized. IAS/Shunts: No atrial level shunt detected by color flow Doppler.  LEFT VENTRICLE PLAX 2D LVIDd:         3.50 cm LVIDs:         3.20 cm LV PW:         1.20 cm LV IVS:        0.90 cm LVOT diam:     1.80 cm LV SV:         25 LV SV  Index:   12 LVOT Area:     2.54 cm  RIGHT VENTRICLE RV Basal diam:  5.30 cm RV Mid diam:    5.00 cm RV S prime:     7.62 cm/s LEFT ATRIUM         Index       RIGHT ATRIUM           Index LA diam:    5.50 cm 2.49 cm/m  RA Area:     28.50 cm                                 RA Volume:   104.00 ml 47.17 ml/m  AORTIC VALVE                     PULMONIC VALVE AV Area (Vmax):    0.87 cm      PV Vmax:          0.98 m/s AV Area (Vmean):   0.90 cm      PV Vmean:         69.200 cm/s AV Area (VTI):     1.14 cm      PV VTI:           0.128 m AV Vmax:           168.00 cm/s   PV Peak grad:     3.9 mmHg AV Vmean:          116.000 cm/s  PV Mean grad:     2.5 mmHg AV VTI:            0.223 m       PR End Diast Vel: 10.24 msec AV Peak Grad:      11.3 mmHg AV Mean Grad:      6.0 mmHg LVOT Vmax:         57.60 cm/s LVOT Vmean:        40.900 cm/s LVOT VTI:          0.100 m LVOT/AV VTI ratio: 0.45 AI PHT:            233 msec  AORTA Ao Root diam: 3.10 cm MITRAL VALVE               TRICUSPID VALVE MV Area (PHT): 5.95 cm    TR Peak grad:   55.7 mmHg MV Decel Time: 128 msec    TR Vmax:  373.00 cm/s MV E velocity: 87.60 cm/s                            SHUNTS                            Systemic VTI:  0.10 m                            Systemic Diam: 1.80 cm Yolonda Kida MD Electronically signed by Yolonda Kida MD Signature Date/Time: 09/17/2022/4:54:34 PM    Final    DG Chest 1 View  Result Date: 09/16/2022 CLINICAL DATA:  Worsening shortness of breath. 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 Cardiomegaly which has increased from 2021. Slight globular configuration of the heart, query pericardial effusion. Suspect small right pleural effusion. Chronic bilateral lung opacities that appears stable. No acute airspace disease or pneumothorax. On limited assessment, no acute osseous findings.  IMPRESSION: 1. Increased cardiomegaly from 2021. Slight globular configuration of the heart, query pericardial effusion. 2. Suspect small right pleural effusion. 3. Stable chronic bilateral lung opacities. Electronically Signed   By: Keith Rake M.D.   On: 09/16/2022 17:21   MR ABDOMEN MRCP WO CONTRAST  Result Date: 09/16/2022 CLINICAL DATA:  56 year old female with history of jaundice. Nausea. EXAM: MRI ABDOMEN WITHOUT CONTRAST  (INCLUDING MRCP) TECHNIQUE: Multiplanar multisequence MR imaging of the abdomen was performed. Heavily T2-weighted images of the biliary and pancreatic ducts were obtained, and three-dimensional MRCP images were rendered by post processing. COMPARISON:  No prior abdominal MRI or MRCP. Abdominal ultrasound 09/15/2022. FINDINGS: Comment: Portions of today's examination are substantially limited by patient respiratory motion. The study is also severely limited for detection and characterization of visceral and/or vascular lesions by lack of IV gadolinium. Lower chest: Severe cardiomegaly. Massively distended inferior vena cava. Hepatobiliary: No definite suspicious cystic or solid hepatic lesions are confidently identified on today's motion limited noncontrast examination. No intra or extrahepatic biliary ductal dilatation noted on MRCP images. Common bile duct measures only 3 mm in the porta hepatis. No filling defect within the common bile duct to suggest choledocholithiasis. T1 hyperintense, T2 hypointense material lies dependently within the gallbladder. Gallbladder does not appear overly distended. Accurate assessment for gallbladder wall thickening is limited on today's examination secondary to motion, but there may be some very mild gallbladder wall thickening and edema. No overt pericholecystic fluid or surrounding inflammatory changes. Pancreas: No definite pancreatic mass or peripancreatic fluid collections or inflammatory changes noted on today's noncontrast examination.  No pancreatic ductal dilatation. Spleen:  Unremarkable. Adrenals/Urinary Tract: Unenhanced appearance of the kidneys and bilateral adrenal glands is normal. No hydroureteronephrosis in the visualized portions of the abdomen. Stomach/Bowel: Visualized portions are unremarkable. Vascular/Lymphatic: No aneurysm identified in the visualized abdominal vasculature. No lymphadenopathy noted in the abdomen. Other: No significant volume of ascites noted in the visualized portions of the peritoneal cavity. Musculoskeletal: No aggressive appearing osseous lesions are noted in the visualized portions of the skeleton. IMPRESSION: 1. Biliary sludge lying dependently in the gallbladder. Gallbladder wall may be mildly edematous, but is poorly evaluated on today's motion limited examination. If there is any clinical concern for acute cholecystitis, further evaluation with right upper quadrant abdominal ultrasound or nuclear medicine hepatobiliary scan should be  considered at this time. 2. No biliary tract dilatation to suggest obstruction. No evidence of choledocholithiasis. 3. Severe cardiomegaly. Electronically Signed   By: Vinnie Langton M.D.   On: 09/16/2022 05:28   US ABDOMEN LIMITED RUQ (LIVER/GB)  Result Date: 09/15/2022 CLINICAL DATA:  Elevated bilirubin EXAM: ULTRASOUND ABDOMEN LIMITED RIGHT UPPER QUADRANT COMPARISON:  CT abdomen and pelvis 12/01/2018 FINDINGS: Gallbladder: Sludge is present within the gallbladder. Small amount of pericholecystic fluid. The gallbladder wall is mildly thickened measuring 5 mm. Sonographic Murphy's sign was positive. Common bile duct: Diameter: 3 mm Liver: No focal lesion identified. Increased echogenicity. Portal vein is patent on Doppler imaging with bidirectional flow. Other: None. IMPRESSION: 1. Findings compatible with acute cholecystitis. 2. 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 Electronically Signed   By: Placido Sou M.D.   On: 09/15/2022 23:07   (Echo, Carotid, EGD, Colonoscopy, ERCP)    Subjective: Pt c/o intermittent anxiety    Discharge Exam: Vitals:   10/01/22 0851 10/01/22 1215  BP: 115/83 106/85  Pulse: (!) 107 97  Resp: 18 20  Temp: 97.6 F (36.4 C) 98 F (36.7 C)  SpO2: 92% 97%   Vitals:   10/01/22 0403 10/01/22 0412 10/01/22 0851 10/01/22 1215  BP:  123/62 115/83 106/85  Pulse:  (!) 106 (!) 107 97  Resp:  _0 Temp:  97.6 F (36.4 C) 97.6 F (36.4 C) 98 F (36.7 C)  TempSrc:  Oral Oral Oral  SpO2:  100% 92% 97%  Weight: 106.3 kg     Height:        General: Pt is alert, awake, not in acute distress Cardiovascular: S1/S2 +, no rubs, no gallops Respiratory: diminished breath sounds b/l  Abdominal: Soft, NT, obese, bowel sounds + Extremities: no cyanosis    The results of significant diagnostics from this hospitalization (including imaging, microbiology, ancillary and laboratory) are listed below for reference.     Microbiology: Recent Results (from the past 240 hour(s))  Acid Fast Smear (AFB)     Status: None   Collection Time: 09/28/22  2:45 PM   Specimen: Sputum  Result Value Ref Range Status   AFB Specimen Processing Concentration  Final   Acid Fast Smear Negative  Final    Comment: (NOTE) Performed At: [facility] [place], [place] [id number] Rush Farmer MD KJ:[id number]    Source (AFB) SPUTUM  Final    Comment: Performed at [facility], [address] [place], [place] [address]  Acid Fast Culture with reflexed sensitivities     Status: None   Collection Time: 09/29/22 11:10 AM   Specimen: Sputum  Result Value Ref Range Status   Acid Fast Culture DUPLICATE  Final    Comment: (NOTE) Duplicate procedure ordered. Please refer to the following specimen for additional lab results.      [phone] Performed At: [facility] [place], [place] [id number] Rush Farmer MD  FE:[id number]    Source of Sample SPUTUM  Final    Comment: Performed at [facility], [address] [place], [place] [address]     Labs: BNP (last 3 results) Recent Labs    09/16/22 0336 09/18/22 0617 09/23/22 0546  BNP 715.5* 558.5* 8,264.1*   Basic Metabolic Panel: Recent Labs  Lab 09/25/22 0449 09/25/22 1352 09/26/22 0314 09/27/22 0551 09/28/22 0628 09/29/22 0344 09/30/22 0719 10/01/22 0627  NA 146*  --  142 139 137 138 138  138  K 3.0*   < > 3.0* 3.6 3.7 3.3* 3.5 4.4  CL 97*  --  92* 91* 90* 89* 89* 89*  CO2 38*  --  39* 38* 37* 39* 38* 37*  GLUCOSE 93  --  118* 77 80 78 105* 78  BUN 32*  --  36* 37* 37* 35* 33* 36*  CREATININE 1.10*  --  1.52* 1.30* 1.26* 1.23* 1.13* 1.28*  CALCIUM 9.1  --  9.0 8.6* 8.7* 8.7* 8.9 9.1  MG 1.9  --  1.9 1.8 1.8  --  1.7  --   PHOS 2.8  --  3.1 2.7 3.0  --   --   --    < > = values in this interval not displayed.   Liver Function Tests: Recent Labs  Lab 09/25/22 0449 09/28/22 0628 09/29/22 0344 09/30/22 0719 10/01/22 0627  AST  --  50* 58* 61* 63*  ALT  --  _0 34  ALKPHOS  --  81 86 94 89  BILITOT 9.0* 7.4* 7.6* 7.7* 7.8*  PROT  --  6.3* 6.7 6.6 6.7  ALBUMIN  --  2.3* 2.4* 2.5* 2.4*   No results for input(s): "LIPASE", "AMYLASE" in the last 168 hours. Recent Labs  Lab 09/25/22 0449 09/27/22 0551  AMMONIA 53* 51*   CBC: Recent Labs  Lab 09/27/22 0551 09/28/22 0628 09/29/22 0344 09/30/22 1049 10/01/22 0627  WBC 7.5 8.7 9.1 8.1 7.6  HGB 9.3* 9.1* 9.3* 9.3* 9.1*  HCT 33.1* 32.0* 32.4* 32.8* 31.8*  MCV 81.9 83.1 82.4 84.8 85.3  PLT 112* 121* 124* 143* 132*   Cardiac Enzymes: No results for input(s): "CKTOTAL", "CKMB", "CKMBINDEX", "TROPONINI" in the last 168 hours. BNP: Invalid input(s): "POCBNP" CBG: No results for input(s): "GLUCAP" in the last 168 hours. D-Dimer No results for input(s): "DDIMER" in the last 72 hours. Hgb A1c No results for input(s): "HGBA1C" in the last 72  hours. Lipid Profile No results for input(s): "CHOL", "HDL", "LDLCALC", "TRIG", "CHOLHDL", "LDLDIRECT" in the last 72 hours. Thyroid function studies No results for input(s): "TSH", "T4TOTAL", "T3FREE", "THYROIDAB" in the last 72 hours.  Invalid input(s): "FREET3" Anemia work up No results for input(s): "VITAMINB12", "FOLATE", "FERRITIN", "TIBC", "IRON", "RETICCTPCT" in the last 72 hours. Urinalysis    Component Value Date/Time   COLORURINE AMBER (A) 09/16/2022 0942   APPEARANCEUR HAZY (A) 09/16/2022 0942   APPEARANCEUR CLEAR 02/20/2015 1015   LABSPEC 1.016 09/16/2022 0942   LABSPEC 1.016 02/20/2015 1015   PHURINE 5.0 09/16/2022 0942   GLUCOSEU NEGATIVE 09/16/2022 0942   GLUCOSEU NEGATIVE 02/20/2015 1015   HGBUR NEGATIVE 09/16/2022 0942   BILIRUBINUR SMALL (A) 09/16/2022 0942   BILIRUBINUR NEGATIVE 02/20/2015 1015   KETONESUR NEGATIVE 09/16/2022 0942   PROTEINUR 30 (A) 09/16/2022 0942   NITRITE NEGATIVE 09/16/2022 0942   LEUKOCYTESUR NEGATIVE 09/16/2022 0942   LEUKOCYTESUR 2+ 02/20/2015 1015   Sepsis Labs Recent Labs  Lab 09/28/22 0628 09/29/22 0344 09/30/22 1049 10/01/22 0627  WBC 8.7 9.1 8.1 7.6   Microbiology Recent Results (from the past 240 hour(s))  Acid Fast Smear (AFB)     Status: None   Collection Time: 09/28/22  2:45 PM   Specimen: Sputum  Result Value Ref Range Status   AFB Specimen Processing Concentration  Final   Acid Fast Smear Negative  Final    Comment: (NOTE) Performed At: [facility] [address] [place], [place] [id number] Rush Farmer MD UK:[id number]    Source (AFB) SPUTUM  Final    Comment: Performed at [facility], [place], [place] [address]  Acid Fast Culture with reflexed sensitivities     Status: None   Collection Time: 09/29/22 11:10 AM   Specimen: Sputum  Result Value Ref Range Status   Acid Fast Culture DUPLICATE  Final    Comment: (NOTE) Duplicate procedure ordered. Please refer to the  following specimen for additional lab results.      [phone] Performed At: [facility] [place], [place] [id number] Rush Farmer MD JZ:[id number]    Source of Sample SPUTUM  Final    Comment: Performed at [facility], [address] [place], [place] [address]     Time coordinating discharge: Over 30 minutes  SIGNED:   Wyvonnia Dusky, MD  [facility] 10/01/2022, 4:46 PM Pager   If 7PM-7AM, please contact night-coverage [email or web address]

## 2022-10-01 NOTE — Progress Notes (Signed)
Physical Therapy Treatment Patient Details Name: Chelsea Ramsey MRN: [id number] DOB: Mar 04, 1966 Today's Date: 10/01/2022   History of Present Illness Pt is a 56 yo female seen by PCP secondary to not feeling well with abd pain for about a month. MD assessment includes: Right sided abdominal pain, transaminitis, hyperbilirubinemia, AKI, and iron deficiency anemia.    PT Comments    Patient is agreeable to PT. She is eager to return home and hopeful to discharge home today. She is motivated to participate with PT. She continues to require assistance for bed mobility, transfers, and ambulation. She has a standing tolerance of around 3 minutes. She is making progress but will need physical assistance for mobility at discharge. SNF was recommended, however patient is planning to return home with family. Recommend PT follow up to maximize independence and facilitate return to prior level of function.    Recommendations for follow up therapy are one component of a multi-disciplinary discharge planning process, led by the attending physician.  Recommendations may be updated based on patient status, additional functional criteria and insurance authorization.  Follow Up Recommendations  Skilled nursing-short term rehab (<3 hours/day) Can patient physically be transported by private vehicle: No   Assistance Recommended at Discharge Frequent or constant Supervision/Assistance  Patient can return home with the following Two people to help with walking and/or transfers;Two people to help with bathing/dressing/bathroom;Assistance with cooking/housework;Help with stairs or ramp for entrance;Assist for transportation   Equipment Recommendations  Hospital bed;Wheelchair (measurements PT);BSC/3in1    Recommendations for Other Services       Precautions / Restrictions Precautions Precautions: Fall Restrictions Weight Bearing Restrictions: No     Mobility  Bed Mobility Overal bed mobility:  Needs Assistance Bed Mobility: Supine to Sit     Supine to sit: Mod assist, +2 for physical assistance     General bed mobility comments: increased time required to complete tasks    Transfers Overall transfer level: Needs assistance Equipment used: Rolling walker (2 wheels) Transfers: Sit to/from Stand Sit to Stand: Max assist, +2 physical assistance, From elevated surface           General transfer comment: lifting assistance required for standing. Max +2 with the bed in lowest position and Mod A with the bed elevated. verbal cues for technique and task initiation    Ambulation/Gait Ambulation/Gait assistance: Min assist Gait Distance (Feet): 2 Feet Assistive device: Rolling walker (2 wheels) Gait Pattern/deviations: Step-to pattern Gait velocity: decreased     General Gait Details: patient walked a short distance from bed to chair with steadying assistance provided. activity tolerance limited by fatigue   Stairs             Wheelchair Mobility    Modified Rankin (Stroke Patients Only)       Balance Overall balance assessment: Needs assistance Sitting-balance support: Feet supported Sitting balance-Leahy Scale: Fair     Standing balance support: Bilateral upper extremity supported Standing balance-Leahy Scale: Fair Standing balance comment: Min guard for safety. standing tolerance of around 3 minutes today                            Cognition Arousal/Alertness: Awake/alert Behavior During Therapy: Flat affect, WFL for tasks assessed/performed Overall Cognitive Status: No family/caregiver present to determine baseline cognitive functioning  General Comments: patient able to follow all commands with increased time        Exercises      General Comments General comments (skin integrity, edema, etc.): patient was very pleased to be up in the chair at end of session. she is hopeful for  discharge home today and reports she will have physical assistance from several family members if needed      Pertinent Vitals/Pain Pain Assessment Pain Assessment: Faces Faces Pain Scale: Hurts a little bit Pain Location: bottocks Pain Descriptors / Indicators: Discomfort Pain Intervention(s): Limited activity within patient's tolerance, Repositioned    Home Living                          Prior Function            PT Goals (current goals can now be found in the care plan section) Acute Rehab PT Goals Patient Stated Goal: to go home PT Goal Formulation: With patient Time For Goal Achievement: 10/15/22 Potential to Achieve Goals: Fair Progress towards PT goals: Progressing toward goals    Frequency    Min 2X/week      PT Plan Current plan remains appropriate    Co-evaluation   Reason for Co-Treatment: To address functional/ADL transfers PT goals addressed during session: Mobility/safety with mobility        AM-PAC PT "6 Clicks" Mobility   Outcome Measure  Help needed turning from your back to your side while in a flat bed without using bedrails?: A Lot Help needed moving from lying on your back to sitting on the side of a flat bed without using bedrails?: Total Help needed moving to and from a bed to a chair (including a wheelchair)?: A Lot Help needed standing up from a chair using your arms (e.g., wheelchair or bedside chair)?: A Lot Help needed to walk in hospital room?: Total Help needed climbing 3-5 steps with a railing? : Total 6 Click Score: 9    End of Session Equipment Utilized During Treatment: Oxygen Activity Tolerance: Patient tolerated treatment well Patient left: in chair;with call bell/phone within reach;with chair alarm set Nurse Communication: Mobility status PT Visit Diagnosis: Muscle weakness (generalized) (M62.81);Difficulty in walking, not elsewhere classified (R26.2);Unsteadiness on feet (R26.81)     Time: 6389-3734 PT  Time Calculation (min) (ACUTE ONLY): 29 min  Charges:  $Therapeutic Activity: 8-22 mins                     Chelsea Ramsey, PT, MPT    Percell Locus 10/01/2022, 1:02 PM

## 2022-10-01 NOTE — Progress Notes (Signed)
Occupational Therapy Treatment Patient Details Name: Chelsea Ramsey MRN: [id number] DOB: 1966/03/16 Today's Date: 10/01/2022   History of present illness Pt is a 56 yo female seen by PCP secondary to not feeling well with abd pain for about a month. MD assessment includes: Right sided abdominal pain, transaminitis, hyperbilirubinemia, AKI, and iron deficiency anemia.   OT comments  Ms Demma was seen for OT/PT co-treatment on this date. Upon arrival to room pt reclined in bed, agreeable to tx. Pt requires SETUP + SUPERVISION seated gtooming/feeding tasks. MAX A x2 + RW sit<>stand from low bed, MOD A x2 from elevated bed, improves to MIN A for steps to chair.  Pt continues to require heavy physical assist for sit<>stand however reports plan to have father/uncles to assist. Discharge recommendation remains appropriate.     Recommendations for follow up therapy are one component of a multi-disciplinary discharge planning process, led by the attending physician.  Recommendations may be updated based on patient status, additional functional criteria and insurance authorization.    Follow Up Recommendations  Skilled nursing-short term rehab (<3 hours/day)    Assistance Recommended at Discharge Frequent or constant Supervision/Assistance  Patient can return home with the following  A lot of help with walking and/or transfers;A lot of help with bathing/dressing/bathroom;Help with stairs or ramp for entrance;Assist for transportation;Direct supervision/assist for medications management;Direct supervision/assist for financial management;Assistance with cooking/housework   Equipment Recommendations  Hospital bed    Recommendations for Other Services      Precautions / Restrictions Precautions Precautions: Fall Restrictions Weight Bearing Restrictions: No       Mobility Bed Mobility Overal bed mobility: Needs Assistance Bed Mobility: Supine to Sit     Supine to sit: Mod  assist, +2 for physical assistance     General bed mobility comments: increased time required to complete tasks    Transfers Overall transfer level: Needs assistance Equipment used: Rolling walker (2 wheels) Transfers: Sit to/from Stand Sit to Stand: +2 physical assistance, Mod assist, From elevated surface           General transfer comment: MAX A from low heihgt. MIN A for steps     Balance Overall balance assessment: Needs assistance Sitting-balance support: Feet supported Sitting balance-Leahy Scale: Fair     Standing balance support: Bilateral upper extremity supported Standing balance-Leahy Scale: Fair                             ADL either performed or assessed with clinical judgement   ADL Overall ADL's : Needs assistance/impaired                                       General ADL Comments: SETUP + SUPERVISION seated gtooming/feeding tasks. MAX A x2 + RW for simulated BSC t/f. MAX A for LB access in sitting      Cognition Arousal/Alertness: Awake/alert Behavior During Therapy: WFL for tasks assessed/performed Overall Cognitive Status: No family/caregiver present to determine baseline cognitive functioning                                 General Comments: increased time but good insight              General Comments patient was very pleased to be up in the chair at end of  session. she is hopeful for discharge home today and reports she will have physical assistance from several family members if needed    Pertinent Vitals/ Pain       Pain Assessment Pain Assessment: Faces Faces Pain Scale: Hurts a little bit Pain Location: bottocks Pain Descriptors / Indicators: Discomfort Pain Intervention(s): Limited activity within patient's tolerance   Frequency  Min 2X/week        Progress Toward Goals  OT Goals(current goals can now be found in the care plan section)  Progress towards OT goals: Progressing  toward goals  Acute Rehab OT Goals Patient Stated Goal: to go home OT Goal Formulation: With patient Time For Goal Achievement: 10/02/22 Potential to Achieve Goals: Fair ADL Goals Pt Will Perform Grooming: with supervision;standing Pt Will Transfer to Toilet: with supervision;ambulating Pt Will Perform Toileting - Clothing Manipulation and hygiene: with supervision;sit to/from stand Pt/caregiver will Perform Home Exercise Program: Increased strength;Both right and left upper extremity;With written HEP provided;With Supervision;With theraband  Plan Discharge plan remains appropriate;Frequency remains appropriate    Co-evaluation    PT/OT/SLP Co-Evaluation/Treatment: Yes Reason for Co-Treatment: For patient/therapist safety;To address functional/ADL transfers PT goals addressed during session: Mobility/safety with mobility OT goals addressed during session: ADL's and self-care      AM-PAC OT "6 Clicks" Daily Activity     Outcome Measure   Help from another person eating meals?: None Help from another person taking care of personal grooming?: A Little Help from another person toileting, which includes using toliet, bedpan, or urinal?: A Lot Help from another person bathing (including washing, rinsing, drying)?: A Lot Help from another person to put on and taking off regular upper body clothing?: A Little Help from another person to put on and taking off regular lower body clothing?: A Lot 6 Click Score: 16    End of Session    OT Visit Diagnosis: Unsteadiness on feet (R26.81);Repeated falls (R29.6);Muscle weakness (generalized) (M62.81)   Activity Tolerance Patient tolerated treatment well   Patient Left in chair;with call bell/phone within reach;with chair alarm set   Nurse Communication          Time: 5625-6389 OT Time Calculation (min): 24 min  Charges: OT General Charges $OT Visit: 1 Visit OT Treatments $Self Care/Home Management : 8-22 mins  Dessie Coma,  M.S. OTR/L  10/01/22, 1:58 PM  ascom [phone]

## 2022-10-01 NOTE — Progress Notes (Signed)
     Referral received for Chelsea Ramsey for goals of care discussion. Chart reviewed and updates received from RN. Patient assessed and is sleeping and declines to engage appropriately in discussions.   PMT will continue to attempt [place] discussions with patient. DNR previously elected by next of kin/Mother.  Thank you for your referral and allowing PMT to assist in [place] care.   Walden Field, NP Palliative Medicine Team Phone: [phone]  NO CHARGE

## 2022-10-01 NOTE — TOC Progression Note (Addendum)
Transition of Care [facility]) - Progression Note    Patient Details  Name: Chelsea Ramsey MRN: [id number] Date of Birth: 1966-02-25  Transition of Care [facility]) CM/SW [place], LCSW Phone Number: 10/01/2022, 10:36 AM  Clinical Narrative:   [employer] will call mom regarding DME delivery. Mom confirmed patient was on oxygen prior to admission and will get her tank from her previous house.   3:09 pm: Mom said [employer] is delivering DME now and she will call CSW once they are done.  3:44 pm: DME delivered. Family will go get her oxygen. Asked mom to call back once it is at the home.  4:20 pm: Left voicemail for mom to check status of oxygen.  4:57 pm: Mom said all she had was an oxygen tank. Tried calling patient in the room to see what she had prior to admission but no answer. RN said she was sleeping. [employer] does not provide her home oxygen. [employer] is checking.  Expected Discharge Plan and Services                                                 Social Determinants of Health (SDOH) Interventions    Readmission Risk Interventions     No data to display

## 2022-10-01 NOTE — TOC Transition Note (Addendum)
Transition of Care [facility]) - CM/SW Discharge Note   Patient Details  Name: Chelsea Ramsey MRN: [id number] Date of Birth: 11/19/1966  Transition of Care [facility]) CM/SW Contact:  Candie Chroman, LCSW Phone Number: 10/01/2022, 5:06 PM   Clinical Narrative: Patient has orders to discharge home today. EMS transport has been arranged to take her to her mother's home at [address] [place], [place] [address]. Confirmed address with mother again today. She is aware they will likely receive bill for transport home. Mom called and said she found patient's concentrator and it was ready for her. Mom had a question about the tubing so asked RN to call her. Also asked him to notify her when EMS arrived to pick her up. MD is aware that DNR needs to be signed. Left message for [facility] liaison to let him know dc order is in. No further concerns. CSW signing off.    Final next level of care: [place] Barriers to Discharge: Barriers Resolved   Patient Goals and CMS Choice        Discharge Placement                Patient to be transferred to facility by: EMS Name of family member notified: Hurley Cisco Patient and family notified of of transfer: 10/01/22  Discharge Plan and Services                DME Arranged: 3-N-1, Wheelchair manual, Hospital bed DME Agency: [employer] Date DME Agency Contacted: 10/01/22   Representative spoke with at DME Agency: Suanne Marker HH Arranged: RN, PT, OT, Nurse's Aide [facility] Agency: [place] Date [place]: 10/01/22   Representative spoke with at [name]: Adela Lank  Social Determinants of Health (SDOH) Interventions     Readmission Risk Interventions     No data to display

## 2022-10-04 ENCOUNTER — Emergency Department: Payer: Medicaid Other

## 2022-10-04 ENCOUNTER — Inpatient Hospital Stay
Admission: EM | Admit: 2022-10-04 | Discharge: 2022-10-30 | DRG: 871 | Disposition: E | Payer: Medicaid Other | Attending: Pulmonary Disease | Admitting: Pulmonary Disease

## 2022-10-04 ENCOUNTER — Inpatient Hospital Stay: Payer: Medicaid Other

## 2022-10-04 ENCOUNTER — Other Ambulatory Visit: Payer: Self-pay

## 2022-10-04 DIAGNOSIS — G9341 Metabolic encephalopathy: Secondary | ICD-10-CM | POA: Diagnosis present

## 2022-10-04 DIAGNOSIS — R6520 Severe sepsis without septic shock: Secondary | ICD-10-CM | POA: Diagnosis not present

## 2022-10-04 DIAGNOSIS — Z8261 Family history of arthritis: Secondary | ICD-10-CM

## 2022-10-04 DIAGNOSIS — I2510 Atherosclerotic heart disease of native coronary artery without angina pectoris: Secondary | ICD-10-CM | POA: Diagnosis present

## 2022-10-04 DIAGNOSIS — F17210 Nicotine dependence, cigarettes, uncomplicated: Secondary | ICD-10-CM | POA: Diagnosis present

## 2022-10-04 DIAGNOSIS — J9621 Acute and chronic respiratory failure with hypoxia: Secondary | ICD-10-CM | POA: Diagnosis present

## 2022-10-04 DIAGNOSIS — Z96651 Presence of right artificial knee joint: Secondary | ICD-10-CM | POA: Diagnosis present

## 2022-10-04 DIAGNOSIS — Z9981 Dependence on supplemental oxygen: Secondary | ICD-10-CM

## 2022-10-04 DIAGNOSIS — Z515 Encounter for palliative care: Secondary | ICD-10-CM

## 2022-10-04 DIAGNOSIS — R6521 Severe sepsis with septic shock: Secondary | ICD-10-CM | POA: Diagnosis present

## 2022-10-04 DIAGNOSIS — I10 Essential (primary) hypertension: Secondary | ICD-10-CM | POA: Diagnosis present

## 2022-10-04 DIAGNOSIS — A419 Sepsis, unspecified organism: Principal | ICD-10-CM | POA: Diagnosis present

## 2022-10-04 DIAGNOSIS — Z7901 Long term (current) use of anticoagulants: Secondary | ICD-10-CM

## 2022-10-04 DIAGNOSIS — R4182 Altered mental status, unspecified: Secondary | ICD-10-CM

## 2022-10-04 DIAGNOSIS — E785 Hyperlipidemia, unspecified: Secondary | ICD-10-CM | POA: Diagnosis present

## 2022-10-04 DIAGNOSIS — I071 Rheumatic tricuspid insufficiency: Secondary | ICD-10-CM | POA: Diagnosis present

## 2022-10-04 DIAGNOSIS — E872 Acidosis, unspecified: Secondary | ICD-10-CM | POA: Diagnosis present

## 2022-10-04 DIAGNOSIS — N179 Acute kidney failure, unspecified: Secondary | ICD-10-CM | POA: Diagnosis present

## 2022-10-04 DIAGNOSIS — I5082 Biventricular heart failure: Secondary | ICD-10-CM | POA: Diagnosis present

## 2022-10-04 DIAGNOSIS — Z8673 Personal history of transient ischemic attack (TIA), and cerebral infarction without residual deficits: Secondary | ICD-10-CM

## 2022-10-04 DIAGNOSIS — K760 Fatty (change of) liver, not elsewhere classified: Secondary | ICD-10-CM | POA: Diagnosis present

## 2022-10-04 DIAGNOSIS — Z1152 Encounter for screening for COVID-19: Secondary | ICD-10-CM

## 2022-10-04 DIAGNOSIS — G4700 Insomnia, unspecified: Secondary | ICD-10-CM | POA: Diagnosis present

## 2022-10-04 DIAGNOSIS — D631 Anemia in chronic kidney disease: Secondary | ICD-10-CM | POA: Diagnosis present

## 2022-10-04 DIAGNOSIS — I959 Hypotension, unspecified: Secondary | ICD-10-CM | POA: Diagnosis not present

## 2022-10-04 DIAGNOSIS — F1911 Other psychoactive substance abuse, in remission: Secondary | ICD-10-CM | POA: Diagnosis not present

## 2022-10-04 DIAGNOSIS — Z886 Allergy status to analgesic agent status: Secondary | ICD-10-CM

## 2022-10-04 DIAGNOSIS — E875 Hyperkalemia: Secondary | ICD-10-CM | POA: Diagnosis present

## 2022-10-04 DIAGNOSIS — M797 Fibromyalgia: Secondary | ICD-10-CM | POA: Diagnosis present

## 2022-10-04 DIAGNOSIS — R531 Weakness: Secondary | ICD-10-CM | POA: Diagnosis present

## 2022-10-04 DIAGNOSIS — Z885 Allergy status to narcotic agent status: Secondary | ICD-10-CM

## 2022-10-04 DIAGNOSIS — E1122 Type 2 diabetes mellitus with diabetic chronic kidney disease: Secondary | ICD-10-CM | POA: Diagnosis present

## 2022-10-04 DIAGNOSIS — I5023 Acute on chronic systolic (congestive) heart failure: Secondary | ICD-10-CM | POA: Diagnosis present

## 2022-10-04 DIAGNOSIS — R34 Anuria and oliguria: Secondary | ICD-10-CM | POA: Diagnosis not present

## 2022-10-04 DIAGNOSIS — Z6841 Body Mass Index (BMI) 40.0 and over, adult: Secondary | ICD-10-CM | POA: Diagnosis not present

## 2022-10-04 DIAGNOSIS — B9620 Unspecified Escherichia coli [E. coli] as the cause of diseases classified elsewhere: Secondary | ICD-10-CM | POA: Diagnosis present

## 2022-10-04 DIAGNOSIS — N3090 Cystitis, unspecified without hematuria: Secondary | ICD-10-CM | POA: Diagnosis present

## 2022-10-04 DIAGNOSIS — N183 Chronic kidney disease, stage 3 unspecified: Secondary | ICD-10-CM | POA: Diagnosis present

## 2022-10-04 DIAGNOSIS — Z952 Presence of prosthetic heart valve: Secondary | ICD-10-CM

## 2022-10-04 DIAGNOSIS — Z825 Family history of asthma and other chronic lower respiratory diseases: Secondary | ICD-10-CM

## 2022-10-04 DIAGNOSIS — Z7189 Other specified counseling: Secondary | ICD-10-CM | POA: Diagnosis not present

## 2022-10-04 DIAGNOSIS — I2729 Other secondary pulmonary hypertension: Secondary | ICD-10-CM | POA: Diagnosis present

## 2022-10-04 DIAGNOSIS — W050XXA Fall from non-moving wheelchair, initial encounter: Secondary | ICD-10-CM | POA: Diagnosis present

## 2022-10-04 DIAGNOSIS — E1142 Type 2 diabetes mellitus with diabetic polyneuropathy: Secondary | ICD-10-CM | POA: Diagnosis present

## 2022-10-04 DIAGNOSIS — Z7401 Bed confinement status: Secondary | ICD-10-CM

## 2022-10-04 DIAGNOSIS — F329 Major depressive disorder, single episode, unspecified: Secondary | ICD-10-CM | POA: Diagnosis present

## 2022-10-04 DIAGNOSIS — Z8249 Family history of ischemic heart disease and other diseases of the circulatory system: Secondary | ICD-10-CM

## 2022-10-04 DIAGNOSIS — B952 Enterococcus as the cause of diseases classified elsewhere: Secondary | ICD-10-CM | POA: Diagnosis present

## 2022-10-04 DIAGNOSIS — J302 Other seasonal allergic rhinitis: Secondary | ICD-10-CM | POA: Diagnosis present

## 2022-10-04 DIAGNOSIS — I130 Hypertensive heart and chronic kidney disease with heart failure and stage 1 through stage 4 chronic kidney disease, or unspecified chronic kidney disease: Secondary | ICD-10-CM | POA: Diagnosis present

## 2022-10-04 DIAGNOSIS — Z888 Allergy status to other drugs, medicaments and biological substances status: Secondary | ICD-10-CM

## 2022-10-04 DIAGNOSIS — J449 Chronic obstructive pulmonary disease, unspecified: Secondary | ICD-10-CM | POA: Diagnosis present

## 2022-10-04 DIAGNOSIS — F411 Generalized anxiety disorder: Secondary | ICD-10-CM | POA: Diagnosis present

## 2022-10-04 DIAGNOSIS — Z66 Do not resuscitate: Secondary | ICD-10-CM | POA: Diagnosis present

## 2022-10-04 DIAGNOSIS — Z7984 Long term (current) use of oral hypoglycemic drugs: Secondary | ICD-10-CM

## 2022-10-04 DIAGNOSIS — I4891 Unspecified atrial fibrillation: Secondary | ICD-10-CM | POA: Diagnosis present

## 2022-10-04 DIAGNOSIS — K761 Chronic passive congestion of liver: Secondary | ICD-10-CM | POA: Diagnosis present

## 2022-10-04 DIAGNOSIS — K828 Other specified diseases of gallbladder: Secondary | ICD-10-CM | POA: Diagnosis present

## 2022-10-04 DIAGNOSIS — Z79891 Long term (current) use of opiate analgesic: Secondary | ICD-10-CM

## 2022-10-04 DIAGNOSIS — Z79899 Other long term (current) drug therapy: Secondary | ICD-10-CM

## 2022-10-04 DIAGNOSIS — I480 Paroxysmal atrial fibrillation: Secondary | ICD-10-CM | POA: Diagnosis present

## 2022-10-04 DIAGNOSIS — K219 Gastro-esophageal reflux disease without esophagitis: Secondary | ICD-10-CM | POA: Diagnosis present

## 2022-10-04 DIAGNOSIS — R339 Retention of urine, unspecified: Secondary | ICD-10-CM | POA: Diagnosis not present

## 2022-10-04 DIAGNOSIS — Z91148 Patient's other noncompliance with medication regimen for other reason: Secondary | ICD-10-CM

## 2022-10-04 DIAGNOSIS — R7989 Other specified abnormal findings of blood chemistry: Secondary | ICD-10-CM | POA: Diagnosis present

## 2022-10-04 DIAGNOSIS — G4733 Obstructive sleep apnea (adult) (pediatric): Secondary | ICD-10-CM | POA: Diagnosis present

## 2022-10-04 DIAGNOSIS — R54 Age-related physical debility: Secondary | ICD-10-CM | POA: Diagnosis present

## 2022-10-04 DIAGNOSIS — Z951 Presence of aortocoronary bypass graft: Secondary | ICD-10-CM

## 2022-10-04 LAB — HEPATIC FUNCTION PANEL
ALT: 36 U/L (ref 0–44)
AST: 70 U/L — ABNORMAL HIGH (ref 15–41)
Albumin: 2.6 g/dL — ABNORMAL LOW (ref 3.5–5.0)
Alkaline Phosphatase: 93 U/L (ref 38–126)
Bilirubin, Direct: 4.7 mg/dL — ABNORMAL HIGH (ref 0.0–0.2)
Indirect Bilirubin: 4.3 mg/dL — ABNORMAL HIGH (ref 0.3–0.9)
Total Bilirubin: 9 mg/dL — ABNORMAL HIGH (ref 0.3–1.2)
Total Protein: 6.9 g/dL (ref 6.5–8.1)

## 2022-10-04 LAB — CBC WITH DIFFERENTIAL/PLATELET
Abs Immature Granulocytes: 0.06 10*3/uL (ref 0.00–0.07)
Basophils Absolute: 0.1 10*3/uL (ref 0.0–0.1)
Basophils Relative: 1 %
Eosinophils Absolute: 0.2 10*3/uL (ref 0.0–0.5)
Eosinophils Relative: 1 %
HCT: 30.7 % — ABNORMAL LOW (ref 36.0–46.0)
Hemoglobin: 8.6 g/dL — ABNORMAL LOW (ref 12.0–15.0)
Immature Granulocytes: 1 %
Lymphocytes Relative: 14 %
Lymphs Abs: 1.6 10*3/uL (ref 0.7–4.0)
MCH: 24.8 pg — ABNORMAL LOW (ref 26.0–34.0)
MCHC: 28 g/dL — ABNORMAL LOW (ref 30.0–36.0)
MCV: 88.5 fL (ref 80.0–100.0)
Monocytes Absolute: 0.7 10*3/uL (ref 0.1–1.0)
Monocytes Relative: 6 %
Neutro Abs: 9.4 10*3/uL — ABNORMAL HIGH (ref 1.7–7.7)
Neutrophils Relative %: 77 %
Platelets: 258 10*3/uL (ref 150–400)
RBC: 3.47 MIL/uL — ABNORMAL LOW (ref 3.87–5.11)
RDW: 26.7 % — ABNORMAL HIGH (ref 11.5–15.5)
Smear Review: NORMAL
WBC: 12 10*3/uL — ABNORMAL HIGH (ref 4.0–10.5)
nRBC: 0.8 % — ABNORMAL HIGH (ref 0.0–0.2)

## 2022-10-04 LAB — URINALYSIS, ROUTINE W REFLEX MICROSCOPIC
RBC / HPF: >50 RBC/hpf — ABNORMAL HIGH (ref 0–5)
Specific Gravity, Urine: 1.024 (ref 1.005–1.030)
Squamous Epithelial / HPF: >50 — ABNORMAL HIGH (ref 0–5)
WBC, UA: >50 WBC/hpf — ABNORMAL HIGH (ref 0–5)

## 2022-10-04 LAB — TROPONIN I (HIGH SENSITIVITY)
Troponin I (High Sensitivity): 51 ng/L — ABNORMAL HIGH (ref <18)
Troponin I (High Sensitivity): 49 ng/L — ABNORMAL HIGH (ref <18)

## 2022-10-04 LAB — BASIC METABOLIC PANEL
Anion gap: 13 (ref 5–15)
BUN: 50 mg/dL — ABNORMAL HIGH (ref 6–20)
CO2: 34 mmol/L — ABNORMAL HIGH (ref 22–32)
Calcium: 9.3 mg/dL (ref 8.9–10.3)
Chloride: 90 mmol/L — ABNORMAL LOW (ref 98–111)
Creatinine, Ser: 3.83 mg/dL — ABNORMAL HIGH (ref 0.44–1.00)
GFR, Estimated: 13 mL/min — ABNORMAL LOW (ref >60)
Glucose, Bld: 64 mg/dL — ABNORMAL LOW (ref 70–99)
Potassium: 5.5 mmol/L — ABNORMAL HIGH (ref 3.5–5.1)
Sodium: 137 mmol/L (ref 135–145)

## 2022-10-04 LAB — RESP PANEL BY RT-PCR (FLU A&B, COVID) ARPGX2
Influenza A by PCR: NEGATIVE
Influenza B by PCR: NEGATIVE
SARS Coronavirus 2 by RT PCR: NEGATIVE

## 2022-10-04 LAB — BRAIN NATRIURETIC PEPTIDE: B Natriuretic Peptide: 785.2 pg/mL — ABNORMAL HIGH (ref 0.0–100.0)

## 2022-10-04 LAB — MAGNESIUM: Magnesium: 2.2 mg/dL (ref 1.7–2.4)

## 2022-10-04 LAB — LACTIC ACID, PLASMA: Lactic Acid, Venous: 2.1 mmol/L (ref 0.5–1.9)

## 2022-10-04 LAB — PROCALCITONIN: Procalcitonin: 0.69 ng/mL

## 2022-10-04 MED ORDER — SODIUM CHLORIDE 0.9 % IV BOLUS
1000.0000 mL | Freq: Once | INTRAVENOUS | Status: AC
  Administered 2022-10-04: 1000 mL via INTRAVENOUS

## 2022-10-04 MED ORDER — ACETAMINOPHEN 650 MG RE SUPP: 650.0000 mg | Freq: Four times a day (QID) | RECTAL | Status: DC | PRN

## 2022-10-04 MED ORDER — VANCOMYCIN HCL IN DEXTROSE 1-5 GM/200ML-% IV SOLN
1000.0000 mg | Freq: Once | INTRAVENOUS | Status: AC
  Administered 2022-10-04: 1000 mg via INTRAVENOUS
  Filled 2022-10-04: qty 200

## 2022-10-04 MED ORDER — ALBUMIN HUMAN 25 % IV SOLN
25.0000 g | Freq: Once | INTRAVENOUS | Status: AC
  Administered 2022-10-04: 25 g via INTRAVENOUS
  Filled 2022-10-04: qty 100

## 2022-10-04 MED ORDER — SENNOSIDES-DOCUSATE SODIUM 8.6-50 MG PO TABS: 1.0000 | ORAL_TABLET | Freq: Every evening | ORAL | Status: DC | PRN

## 2022-10-04 MED ORDER — MIDODRINE HCL 5 MG PO TABS
5.0000 mg | ORAL_TABLET | Freq: Once | ORAL | Status: AC
  Administered 2022-10-04: 5 mg via ORAL
  Filled 2022-10-04: qty 1

## 2022-10-04 MED ORDER — ACETAMINOPHEN 325 MG PO TABS: 650.0000 mg | ORAL_TABLET | Freq: Four times a day (QID) | ORAL | Status: DC | PRN

## 2022-10-04 MED ORDER — SODIUM CHLORIDE 0.9 % IV SOLN
2.0000 g | Freq: Once | INTRAVENOUS | Status: AC
  Administered 2022-10-04: 2 g via INTRAVENOUS
  Filled 2022-10-04: qty 12.5

## 2022-10-04 MED ORDER — SODIUM CHLORIDE 0.9 % IV SOLN: INTRAVENOUS | Status: AC

## 2022-10-04 MED ORDER — HEPARIN SODIUM (PORCINE) 5000 UNIT/ML IJ SOLN
5000.0000 [IU] | Freq: Three times a day (TID) | INTRAMUSCULAR | Status: DC
  Administered 2022-10-04 – 2022-10-05 (×3): 5000 [IU] via SUBCUTANEOUS
  Filled 2022-10-04 (×3): qty 1

## 2022-10-04 MED ORDER — NITROGLYCERIN 0.4 MG SL SUBL: 0.4000 mg | SUBLINGUAL_TABLET | SUBLINGUAL | Status: DC | PRN

## 2022-10-04 MED ORDER — THIAMINE HCL 100 MG/ML IJ SOLN
100.0000 mg | Freq: Every day | INTRAMUSCULAR | Status: AC
  Administered 2022-10-04 – 2022-10-06 (×3): 100 mg via INTRAVENOUS
  Filled 2022-10-04 (×3): qty 2

## 2022-10-04 MED ORDER — VANCOMYCIN VARIABLE DOSE PER UNSTABLE RENAL FUNCTION (PHARMACIST DOSING): Status: DC

## 2022-10-04 MED ORDER — DOCUSATE SODIUM 100 MG PO CAPS: 100.0000 mg | ORAL_CAPSULE | Freq: Two times a day (BID) | ORAL | Status: DC | PRN

## 2022-10-04 MED ORDER — PANTOPRAZOLE SODIUM 40 MG PO TBEC
80.0000 mg | DELAYED_RELEASE_TABLET | Freq: Every day | ORAL | Status: DC
  Administered 2022-10-06 – 2022-10-08 (×3): 80 mg via ORAL
  Filled 2022-10-04 (×5): qty 2

## 2022-10-04 MED ORDER — LORAZEPAM 2 MG/ML IJ SOLN
1.0000 mg | INTRAMUSCULAR | Status: AC | PRN
  Administered 2022-10-06 – 2022-10-08 (×4): 1 mg via INTRAVENOUS
  Filled 2022-10-04 (×4): qty 1

## 2022-10-04 MED ORDER — SODIUM CHLORIDE 0.9 % IV SOLN: 2.0000 g | INTRAVENOUS | Status: DC

## 2022-10-04 MED ORDER — ONDANSETRON HCL 4 MG PO TABS: 4.0000 mg | ORAL_TABLET | Freq: Four times a day (QID) | ORAL | Status: DC | PRN

## 2022-10-04 MED ORDER — OXYCODONE-ACETAMINOPHEN 5-325 MG PO TABS
1.0000 | ORAL_TABLET | Freq: Three times a day (TID) | ORAL | Status: AC | PRN
  Administered 2022-10-04 – 2022-10-07 (×2): 1 via ORAL
  Filled 2022-10-04 (×2): qty 1

## 2022-10-04 MED ORDER — FUROSEMIDE 10 MG/ML IJ SOLN
80.0000 mg | Freq: Once | INTRAMUSCULAR | Status: DC
  Filled 2022-10-04: qty 8

## 2022-10-04 MED ORDER — ROSUVASTATIN CALCIUM 10 MG PO TABS
10.0000 mg | ORAL_TABLET | Freq: Every day | ORAL | Status: DC
  Administered 2022-10-06 – 2022-10-08 (×3): 10 mg via ORAL
  Filled 2022-10-04 (×6): qty 1

## 2022-10-04 MED ORDER — SODIUM CHLORIDE 0.9 % IV BOLUS (SEPSIS)
1000.0000 mL | Freq: Once | INTRAVENOUS | Status: AC
  Administered 2022-10-04: 1000 mL via INTRAVENOUS

## 2022-10-04 MED ORDER — RIFAXIMIN 550 MG PO TABS
550.0000 mg | ORAL_TABLET | Freq: Two times a day (BID) | ORAL | Status: DC
  Administered 2022-10-04 – 2022-10-08 (×8): 550 mg via ORAL
  Filled 2022-10-04 (×9): qty 1

## 2022-10-04 MED ORDER — MELATONIN 5 MG PO TABS
5.0000 mg | ORAL_TABLET | Freq: Every evening | ORAL | Status: DC | PRN
  Administered 2022-10-04 – 2022-10-07 (×2): 5 mg via ORAL
  Filled 2022-10-04 (×2): qty 1

## 2022-10-04 MED ORDER — HYDRALAZINE HCL 20 MG/ML IJ SOLN: 5.0000 mg | Freq: Three times a day (TID) | INTRAMUSCULAR | Status: AC | PRN

## 2022-10-04 MED ORDER — ONDANSETRON HCL 4 MG/2ML IJ SOLN
4.0000 mg | Freq: Four times a day (QID) | INTRAMUSCULAR | Status: DC | PRN
  Administered 2022-10-04: 4 mg via INTRAVENOUS
  Filled 2022-10-04: qty 2

## 2022-10-04 NOTE — Hospital Course (Signed)
[name] is a 56 year old female with history of morbid obesity, history of drug use including cocaine and THC, history of EtOH abuse, neuropathy, depression, atrial fibrillation on Xarelto, hyperlipidemia, hypertension, who presents emergency department for chief concerns of shortness of breath.  Initial vitals in the emergency department showed temperature of 97.8, respiration rate of 21, heart rate of 99, blood pressure 129/93, SPO2 of 98% on 4 L nasal cannula.  Serum sodium is 137, potassium 5.5, chloride of 90, bicarb 34, BUN of 50, serum creatinine of 3.83, GFR 13, nonfasting blood glucose 64, WBC 12, hemoglobin 8.6, platelets of 258.  ED treatment: Cefepime 2 g, vancomycin, sodium chloride 1 L bolus.  Initial Lasix was ordered however was discontinued when patient became hypotensive.

## 2022-10-04 NOTE — Consult Note (Signed)
Pharmacy Antibiotic Note  Chelsea Ramsey is a 56 y.o. female w/ h/o HFrEF (EF 20-25% & G3DD), cocaine use, HTN, mod-sev pulm HTN, IDA, & Afib recently discharge on 11/2 after admission for new onset HFrEF with AKI and congestive hepatophathy now returning on 10/19/2022 with sepsis and acute organ dysfunction.  Pharmacy has been consulted for vancomycin & cefepime dosing.  Plan: F/u MRSA PCR ordered & cultures. CTM AKI/acute renal failure closely, Scr 11/5 3.83 when 11/1-11/2 was 1.13-1.28.  Pt received Vancomycin 1g IV x1 in ED will complete loading dose with additional 1g IV x1 now; followed by: Vancomycin currently calculates around Q48h dosing interval. No maintenance dose ordered yet, will follow up with AM labs to see if improving or dose by level would be appropriate.   Cefepime 2g IV x1 in ED; followed by Cefepime 2g IV q24h (11/5 1800>>    Temp (24hrs), Avg:97.8 F (36.6 C), Min:97.8 F (36.6 C), Max:97.8 F (36.6 C)  Recent Labs  Lab 09/28/22 0628 09/29/22 0344 09/30/22 0719 09/30/22 1049 10/01/22 0627 10/03/2022 1602 10/12/2022 1707  WBC 8.7 9.1  --  8.1 7.6 12.0*  --   CREATININE 1.26* 1.23* 1.13*  --  1.28* 3.83*  --   LATICACIDVEN  --   --   --   --   --   --  2.1*    Estimated Creatinine Clearance: 19.7 mL/min (A) (by C-G formula based on SCr of 3.83 mg/dL (H)).    Allergies  Allergen Reactions   Amiodarone Nausea And Vomiting   Aspirin Swelling   Flexeril [Cyclobenzaprine] Swelling   Trazamine [Trazodone & Diet Manage Prod] Nausea And Vomiting   Codeine Rash   Tramadol Rash    Antimicrobials this admission: VAN/CFP (11/5 >>  Dose adjustments this admission: Severe AKI CTM closely daily for adjustments  Microbiology results: 11/5 BCx: ordered 11/5 Covid: negative  11/5 MRSA PCR: ordered  Thank you for allowing pharmacy to be a part of this patient's care.  Chelsea Ramsey Chelsea Ramsey 10/01/2022 6:05 PM

## 2022-10-04 NOTE — Assessment & Plan Note (Addendum)
-   Query metabolic encephalopathy - Per husband, patient is a strong and since about 2 months ago, after procedure, she has not been the same since.  He reports that she was able to walk before even weekly and with assistance.  He reports that she no murmurs nearly incoherently and has difficulty walking.  Now she is mostly bedbound except for walking to the restroom - CT scan of the head without contrast has been ordered - Check UDS, urine culture, B1, B12, check ammonia - There may also be a component of polypharmacy as patient is on Valium 10 mg p.o. twice daily for anxiety, oxycodone 9 mg twice daily, gabapentin 300 mg twice daily and with acute kidney injury this may contribute to patient being altered - I did not resume home Valium on admission - I ordered Ativan 1 mg every 4 hours as needed for anxiety and seizure - We do not have oxycodone 9 mg on formulary and given that patient has altered mental status and hypotensive, judicious use will be in place - Oxycodone/acetaminophen 5-325 mg p.o. every 8 hours.  For moderate-severe pain ordered 2 doses - AM team to reevaluate patient at bedside in the a.m. to determine continued opioid requirements - Cross coverage provider aware

## 2022-10-04 NOTE — Progress Notes (Signed)
       CROSS COVER NOTE  NAME: Chelsea Ramsey MRN: [id number] DOB : 12-06-1965 ATTENDING PHYSICIAN: Cox, Amy N, DO    Date of Service   09/30/2022   HPI/Events of Note   Notified of ongoing hypotension BP 87/35 MAP 53.  Chelsea Ramsey is a 56 year old female with history of morbid obesity, history of drug use including cocaine and THC, history of EtOH abuse, neuropathy, depression, atrial fibrillation on Xarelto, hyperlipidemia, hypertension, who presents emergency department for chief concerns of shortness of breath. She is being treated for severe sepsis.  Interventions   Assessment/Plan:  Septic Shock Albumin x1 Levophed, MAP goal >65 PCCM consulted  Husband Chelsea Ramsey updated via phone at [phone]    This document was prepared using Dragon voice recognition software and may include unintentional dictation errors.  Neomia Glass DNP, MBA, FNP-BC Nurse Practitioner [facility] Pager [phone]

## 2022-10-04 NOTE — Assessment & Plan Note (Signed)
-   Home Valium not resumed on admission - Ativan 1 mg IV every 4 hours.  Procedure and anxiety, 4 doses ordered

## 2022-10-04 NOTE — ED Notes (Signed)
Iv consult declined order, Lab contacted for collection.

## 2022-10-04 NOTE — ED Provider Notes (Incomplete)
[facility] Provider Note    Event Date/Time   First MD Initiated Contact with Patient 10/11/2022 1500     (approximate)   History   Shortness of Breath   HPI  Chelsea Ramsey is a 56 y.o. female with a history of DM, pulmonary hypertension, COPD, CHF, OSA, and GERD who presents with generalized weakness, shortness of breath, and itching.  The patient was discharged from the hospital on 11/2.  The patient herself states that her main complaint is itching as well as pain in her "poo poo."  She denies any shortness of breath currently.  I talked to the mother on the phone who states that the patient has been weak and not able to walk.  She has not been taking her medications appropriately.  I also spoke to the husband who is here with her.  He confirms that she has been weak, short of breath, and requiring oxygen.  He states he does not know why she was discharged in his condition.  I reviewed the past medical records.  The patient was just admitted with a complicated course; per the hospitalist discharge summary on 11/2 she presented with abd pain and feeling unwell, and outpatient labs showed AKI and elevated LFTs.  She was found to have acute on chronic heart failure, and there was concern for hepatic congestion.  She subsequently had multiple electrolyte abnormalities and worsening mental status.  Plan was discharge to SNF but apparently a local SNF bed was not found and the family did not want her leaving the county so the plan was changed to discharge home with home health and DME.  From the information I can gather it appears that the mother is the healthcare proxy.  The patient is DNR.     Physical Exam   Triage Vital Signs: ED Triage Vitals  Enc Vitals Group     BP 10/17/2022 1430 (!) 129/93     Pulse Rate 10/19/2022 1430 99     Resp 10/27/2022 1430 (!) 21     Temp 10/24/2022 1430 97.8 F (36.6 C)     Temp Source 10/02/2022 1430 Oral     SpO2 10/23/2022  1430 98 %     Weight --      Height --      Head Circumference --      Peak Flow --      Pain Score 10/28/2022 1452 2     Pain Loc --      Pain Edu? --      Excl. in [place]? --     Most recent vital signs: Vitals:   10/29/2022 2243 10/05/22 0000  BP: 104/70 108/64  Pulse: 93 90  Resp: 18 12  Temp: 98.4 F (36.9 C) 98.5 F (36.9 C)  SpO2: 100% 99%     General: Somnolent but arousable, no acute distress. CV:  Good peripheral perfusion.  Resp:  Increased effort.  Somewhat diminished breath sounds bilaterally. Abd:  Soft and nontender.  No distention.  Other:  No significant peripheral edema.  Motor intact in all extremities.  Very dry mucous membranes.  Sacral and perianal areas with no erythema, induration, or open wounds.  Mildly irritated skin.  No blood.   ED Results / Procedures / Treatments   Labs (all labs ordered are listed, but only abnormal results are displayed) Labs Reviewed  BASIC METABOLIC PANEL - Abnormal; Notable for the following components:      Result Value  Potassium 5.5 (*)    Chloride 90 (*)    CO2 34 (*)    Glucose, Bld 64 (*)    BUN 50 (*)    Creatinine, Ser 3.83 (*)    GFR, Estimated 13 (*)    All other components within normal limits  HEPATIC FUNCTION PANEL - Abnormal; Notable for the following components:   Albumin 2.6 (*)    AST 70 (*)    Total Bilirubin 9.0 (*)    Bilirubin, Direct 4.7 (*)    Indirect Bilirubin 4.3 (*)    All other components within normal limits  CBC WITH DIFFERENTIAL/PLATELET - Abnormal; Notable for the following components:   WBC 12.0 (*)    RBC 3.47 (*)    Hemoglobin 8.6 (*)    HCT 30.7 (*)    MCH 24.8 (*)    MCHC 28.0 (*)    RDW 26.7 (*)    nRBC 0.8 (*)    Neutro Abs 9.4 (*)    All other components within normal limits  BRAIN NATRIURETIC PEPTIDE - Abnormal; Notable for the following components:   B Natriuretic Peptide 785.2 (*)    All other components within normal limits  URINALYSIS, ROUTINE W REFLEX  MICROSCOPIC - Abnormal; Notable for the following components:   Color, Urine BROWN (*)    APPearance TURBID (*)    Glucose, UA   (*)    Value: TEST NOT REPORTED DUE TO COLOR INTERFERENCE OF URINE PIGMENT   Hgb urine dipstick   (*)    Value: TEST NOT REPORTED DUE TO COLOR INTERFERENCE OF URINE PIGMENT   Bilirubin Urine   (*)    Value: TEST NOT REPORTED DUE TO COLOR INTERFERENCE OF URINE PIGMENT   Ketones, ur   (*)    Value: TEST NOT REPORTED DUE TO COLOR INTERFERENCE OF URINE PIGMENT   Protein, ur   (*)    Value: TEST NOT REPORTED DUE TO COLOR INTERFERENCE OF URINE PIGMENT   Nitrite   (*)    Value: TEST NOT REPORTED DUE TO COLOR INTERFERENCE OF URINE PIGMENT   Leukocytes,Ua   (*)    Value: TEST NOT REPORTED DUE TO COLOR INTERFERENCE OF URINE PIGMENT   RBC / HPF >50 (*)    WBC, UA >50 (*)    Bacteria, UA MANY (*)    Squamous Epithelial / LPF >50 (*)    Non Squamous Epithelial PRESENT (*)    All other components within normal limits  LACTIC ACID, PLASMA - Abnormal; Notable for the following components:   Lactic Acid, Venous 2.1 (*)    All other components within normal limits  TROPONIN I (HIGH SENSITIVITY) - Abnormal; Notable for the following components:   Troponin I (High Sensitivity) 51 (*)    All other components within normal limits  TROPONIN I (HIGH SENSITIVITY) - Abnormal; Notable for the following components:   Troponin I (High Sensitivity) 49 (*)    All other components within normal limits  RESP PANEL BY RT-PCR (FLU A&B, COVID) ARPGX2  CULTURE, BLOOD (ROUTINE X 2)  CULTURE, BLOOD (ROUTINE X 2)  SURGICAL PCR SCREEN  URINE CULTURE  MAGNESIUM  PROCALCITONIN  LACTIC ACID, PLASMA  COMPREHENSIVE METABOLIC PANEL  CBC  URINE DRUG SCREEN, QUALITATIVE ([facility] ONLY)  VITAMIN B1  VITAMIN B12  AMMONIA     EKG  ED ECG REPORT I, Arta Silence, the attending physician, personally viewed and interpreted this ECG.  Date: 10/15/2022 EKG Time: 1434 Rate: 110 Rhythm:  Atrial fibrillation QRS  Axis: normal Intervals: normal ST/T Wave abnormalities: RVH with nonspecific abnormalities Narrative Interpretation: no evidence of acute ischemia    RADIOLOGY  Chest x-ray: I independently viewed and interpreted the images; there are bilateral interstitial opacities with no focal consolidation   PROCEDURES:  Critical Care performed: Yes, see critical care procedure note(s)  .Critical Care  Performed by: Arta Silence, MD Authorized by: Arta Silence, MD   Critical care provider statement:    Critical care time (minutes):  30   Critical care was necessary to treat or prevent imminent or life-threatening deterioration of the following conditions:  Sepsis and circulatory failure   Critical care was time spent personally by me on the following activities:  Development of treatment plan with patient or surrogate, discussions with consultants, evaluation of patient's response to treatment, examination of patient, ordering and review of laboratory studies, ordering and review of radiographic studies, ordering and performing treatments and interventions, pulse oximetry, re-evaluation of patient's condition, review of old charts and obtaining history from patient or surrogate   Care discussed with: admitting provider      MEDICATIONS ORDERED IN ED: Medications  rosuvastatin (CRESTOR) tablet 10 mg (has no administration in time range)  acetaminophen (TYLENOL) tablet 650 mg (has no administration in time range)    Or  acetaminophen (TYLENOL) suppository 650 mg (has no administration in time range)  ondansetron (ZOFRAN) tablet 4 mg ( Oral See Alternative 10/09/2022 2252)    Or  ondansetron (ZOFRAN) injection 4 mg (4 mg Intravenous Given 10/25/2022 2252)  senna-docusate (Senokot-S) tablet 1 tablet (has no administration in time range)  vancomycin variable dose per unstable renal function (pharmacist dosing) (has no administration in time range)  ceFEPIme  (MAXIPIME) 2 g in sodium chloride 0.9 % 100 mL IVPB (has no administration in time range)  thiamine (VITAMIN B1) injection 100 mg (100 mg Intravenous Given 09/30/2022 2249)  rifaximin (XIFAXAN) tablet 550 mg (550 mg Oral Given 09/30/2022 2250)  nitroGLYCERIN (NITROSTAT) SL tablet 0.4 mg (has no administration in time range)  docusate sodium (COLACE) capsule 100 mg (has no administration in time range)  pantoprazole (PROTONIX) EC tablet 80 mg (has no administration in time range)  LORazepam (ATIVAN) injection 1 mg (has no administration in time range)  oxyCODONE-acetaminophen (PERCOCET/ROXICET) 5-325 MG per tablet 1 tablet (1 tablet Oral Given 10/28/2022 2252)  heparin injection 5,000 Units (5,000 Units Subcutaneous Given 10/28/2022 2251)  hydrALAZINE (APRESOLINE) injection 5 mg (has no administration in time range)  melatonin tablet 5 mg (5 mg Oral Given 10/08/2022 2251)  0.9 %  sodium chloride infusion ( Intravenous New Bag/Given 10/05/2022 2300)  sodium chloride 0.9 % bolus 1,000 mL (0 mLs Intravenous Stopped 10/14/2022 1825)  vancomycin (VANCOCIN) IVPB 1000 mg/200 mL premix (0 mg Intravenous Stopped 10/09/2022 2001)  ceFEPIme (MAXIPIME) 2 g in sodium chloride 0.9 % 100 mL IVPB (0 g Intravenous Stopped 10/23/2022 2001)  sodium chloride 0.9 % bolus 1,000 mL (0 mLs Intravenous Stopped 10/19/2022 1930)  vancomycin (VANCOCIN) IVPB 1000 mg/200 mL premix (0 mg Intravenous Stopped 10/25/2022 2350)  sodium chloride 0.9 % bolus 1,000 mL (0 mLs Intravenous Stopped 10/01/2022 2150)  midodrine (PROAMATINE) tablet 5 mg (5 mg Oral Given 10/15/2022 2250)  albumin human 25 % solution 25 g (25 g Intravenous New Bag/Given 10/01/2022 2313)     IMPRESSION / MDM / [place] / ED COURSE  I reviewed the triage vital signs and the nursing notes.  56 year old female with PMH as noted above and status post recent  admission presents with worsening generalized weakness, shortness of breath requiring oxygen, and also reporting itching and some pain  with bowel movements.  On exam the patient is weak appearing.  O2 saturation is in the mid to high 90s on 2 L by nasal cannula.  The patient has some increased work of breathing.  Abdomen is nontender.  Neurologic exam is nonfocal.  Differential diagnosis includes, but is not limited to, worsening CHF, electrolyte abnormality, diabetic crisis, other metabolic cause, ACS or other cardiac etiology, UTI or other infection.  Patient's presentation is most consistent with acute presentation with potential threat to life or bodily function.  The patient is on the cardiac monitor to evaluate for evidence of arrhythmia and/or significant heart rate changes.  ----------------------------------------- 7:27 PM on 10/29/2022 -----------------------------------------  Chest x-ray showed interstitial opacities but no obvious evidence of pneumonia.  However the work-up is concerning for sepsis with elevated lactate and leukocytosis.  Urinalysis shows significant blood.  Creatinine is significant elevated for when the patient left the hospital.  The patient had an episode of hypotension with BP as low as the 16X systolic.  The blood pressure was rechecked several times on both arms, however it started to come up on its own.  The patient remained alert and oriented throughout this episode.  I ordered IV fluids and empiric antibiotics per the sepsis protocol.  I consulted Dr. Tobie Poet from the hospitalist service; based on her discussion she agrees to admit the patient.  FINAL CLINICAL IMPRESSION(S) / ED DIAGNOSES   Final diagnoses:  Sepsis, due to unspecified organism, unspecified whether acute organ dysfunction present ([place])  AKI (acute kidney injury) ([place])  Generalized weakness  Acute on chronic respiratory failure with hypoxia ([place])     Rx / DC Orders   ED Discharge Orders     None        Note:  This document was prepared using Dragon voice recognition software and may include unintentional  dictation errors.    Arta Silence, MD 10/05/22 [place] Saa, MD 10/05/22 [phone]

## 2022-10-04 NOTE — ED Notes (Signed)
Iv team consult placed unable to obtain labs after multiple attempts. MD aware.

## 2022-10-04 NOTE — Assessment & Plan Note (Signed)
-   Melatonin 5 mg nightly as needed for sleep ordered

## 2022-10-04 NOTE — Assessment & Plan Note (Signed)
-   Lactulose not resumed on admission due to acute kidney injury - Rifaximin resumed

## 2022-10-04 NOTE — ED Triage Notes (Addendum)
PT arrivres via EMS, c/o SOB. No home O2, Per EMS 88 on RA, 100 on 12L.Started this morning and got progressively worse. Hospitalized last week, was to follow up with cardiology. 110/60, AFIb at 110 on 12 lead., unsure of HX. Cbg 94. Placed on [place] at 4L with saturation 98%. Husband reports pt bed bound since last admission.

## 2022-10-04 NOTE — Assessment & Plan Note (Signed)
-   Patient states that she has not smoked, drink alcohol, use cocaine for 13 years

## 2022-10-04 NOTE — Consult Note (Signed)
PHARMACY -  BRIEF ANTIBIOTIC NOTE   Pharmacy has received consult(s) for vancomycin & cefepime from an ED provider.  The patient's profile has been reviewed for ht/wt/allergies/indication/available labs.    One time order(s) placed for: Vancomycin 1g IV x1 in ED Cefepime 2g IV x1 in ED  Further antibiotics/pharmacy consults should be ordered by admitting physician if indicated.                       Thank you, Lorna Dibble 10/13/2022  5:13 PM

## 2022-10-04 NOTE — Assessment & Plan Note (Signed)
-  Patient has EGFR of 13 on admission - We will order heparin 5000 units subcutaneous every 8 hours at this time - AM team to resume Xarelto when benefit outweigh the risk

## 2022-10-04 NOTE — Assessment & Plan Note (Addendum)
-   Appears to have responded to IV fluid bolus, 1 L of sodium chloride per EDP - I ordered additional 2 L of sodium chloride to complete sepsis bolus - Sodium chloride 150 mL/h ordered - Midodrine 5 mg p.o. one-time dose ordered

## 2022-10-04 NOTE — ED Notes (Signed)
Pt found at edge of bed. Pt put back in bed, MD notified of low BP. Fluids given, pts remains at baseline.

## 2022-10-04 NOTE — Assessment & Plan Note (Addendum)
-   Home antihypertensive medications not resumed on admission due to severe sepsis with hypotension on initial presentation - Hydralazine 5 mg IV every 8 hours.  For SBP greater than 180, 4 days ordered

## 2022-10-04 NOTE — Assessment & Plan Note (Signed)
-  Home Xarelto not resumed on admission, EGFR is 13

## 2022-10-04 NOTE — Assessment & Plan Note (Addendum)
-   Patient meets criteria for severe sepsis with increased respiration rate, heart rate, leukocytosis, elevated lactic acid of 2.1, organ involvement are respiratory and renal with hypotension - Blood cultures x2 are in process, procalcitonin are in process - Check a urine culture - Patient initially was hypotensive and responded to sodium chloride 1 L bolus - Ordered additional sodium chloride 1 L bolus, 2 L total - Continue with cefepime, vancomycin - Discussed with cross coverage provider - Admit to stepdown, inpatient

## 2022-10-04 NOTE — H&P (Addendum)
History and Physical   Chelsea Ramsey HWK:[id number] DOB: [birth year] DOA: 10/11/2022  PCP: Emelia Loron, NP  Outpatient Specialists: Dr. Marius Ditch, GI Patient coming from: Home via EMS  I have personally briefly reviewed patient's old medical records in [place].  Chief Concern: Shortness of breath  HPI: [name] is a 56 year old female with history of morbid obesity, history of drug use including cocaine and THC, history of EtOH abuse, neuropathy, depression, atrial fibrillation on Xarelto, hyperlipidemia, hypertension, who presents emergency department for chief concerns of shortness of breath.  Initial vitals in the emergency department showed temperature of 97.8, respiration rate of 21, heart rate of 99, blood pressure 129/93, SPO2 of 98% on 4 L nasal cannula.  Serum sodium is 137, potassium 5.5, chloride of 90, bicarb 34, BUN of 50, serum creatinine of 3.83, GFR 13, nonfasting blood glucose 64, WBC 12, hemoglobin 8.6, platelets of 258.  ED treatment: Cefepime 2 g, vancomycin, sodium chloride 1 L bolus.  Initial Lasix was ordered however was discontinued when patient became hypotensive. -------------------- At bedside patient was able to tell me her name, her age, she knows she is in the hospital and she knows the current year is 41.  She reports that EMS was called because she fell from her wheelchair.  She denies hitting her head.  She denies loss Of consciousness. She also endorses shortness of breath.  She denies known sick contacts.  Patient has been slurring her speech for two months. Family denies fever, nausea, vomiting, diarrhea. She has not been able ambulate as appropriate for two months.   Social history: She lives at home with her mother and father. She denies current tobacco, etoh, and recreational drug use.   ROS: unable to complete as patient has slurring of speech.  ED Course: Discussed with emergency medicine provider, patient requiring  hospitalization for chief concerns of meeting sepsis criteria.  Assessment/Plan  Principal Problem:   Severe sepsis with acute organ dysfunction (HCC) Active Problems:   Altered mental status   Chronic anticoagulation (XARELTO)   Acute renal failure (HCC)   HTN (hypertension)   A-fib (HCC)   GAD (generalized anxiety disorder)   History of substance abuse (HCC)   Insomnia   Hypotension   Hepatic steatosis   Assessment and Plan:  * Severe sepsis with acute organ dysfunction (HCC) - Patient meets criteria for severe sepsis with increased respiration rate, heart rate, leukocytosis, elevated lactic acid of 2.1, organ involvement are respiratory and renal with hypotension - Blood cultures x2 are in process, procalcitonin are in process - Check a urine culture - Patient initially was hypotensive and responded to sodium chloride 1 L bolus - Ordered additional sodium chloride 1 L bolus, 2 L total - Continue with cefepime, vancomycin - Discussed with cross coverage provider - Admit to stepdown, inpatient  Altered mental status - Query metabolic encephalopathy - Per husband, patient is a strong and since about 2 months ago, after procedure, she has not been the same since.  He reports that she was able to walk before even weekly and with assistance.  He reports that she no murmurs nearly incoherently and has difficulty walking.  Now she is mostly bedbound except for walking to the restroom - CT scan of the head without contrast has been ordered - Check UDS, urine culture, B1, B12, check ammonia - There may also be a component of polypharmacy as patient is on Valium 10 mg p.o. twice daily for anxiety, oxycodone 9 mg  twice daily, gabapentin 300 mg twice daily and with acute kidney injury this may contribute to patient being altered - I did not resume home Valium on admission - I ordered Ativan 1 mg every 4 hours as needed for anxiety and seizure - We do not have oxycodone 9 mg on formulary  and given that patient has altered mental status and hypotensive, judicious use will be in place - Oxycodone/acetaminophen 5-325 mg p.o. every 8 hours.  For moderate-severe pain ordered 2 doses - AM team to reevaluate patient at bedside in the a.m. to determine continued opioid requirements - Cross coverage provider aware  Chronic anticoagulation [name]) - Patient has EGFR of 13 on admission - We will order heparin 5000 units subcutaneous every 8 hours at this time - AM team to resume Xarelto when benefit outweigh the risk  HTN (hypertension) - Home antihypertensive medications not resumed on admission due to severe sepsis with hypotension on initial presentation - Hydralazine 5 mg IV every 8 hours.  For SBP greater than 180, 4 days ordered  A-fib (HCC) - Home Xarelto not resumed on admission, EGFR is 13  Hepatic steatosis - Lactulose not resumed on admission due to acute kidney injury - Rifaximin resumed  Hypotension - Appears to have responded to IV fluid bolus, 1 L of sodium chloride per EDP - I ordered additional 2 L of sodium chloride to complete sepsis bolus - Sodium chloride 150 mL/h ordered - Midodrine 5 mg p.o. one-time dose ordered  Insomnia - Melatonin 5 mg nightly as needed for sleep ordered  History of substance abuse ([place]) - Patient states that she has not smoked, drink alcohol, use cocaine for 13 years  GAD (generalized anxiety disorder) - Home Valium not resumed on admission - Ativan 1 mg IV every 4 hours.  Procedure and anxiety, 4 doses ordered  Fall precautions, aspiration precaution  TOC consulted Patient is not a candidate to be discharged home and she may benefit from SNF   Chart reviewed.   DVT prophylaxis: Heparin 5000 units Code Status: DNR, MOST form at bedside Diet: N.p.o. except for sips with meds Family Communication: Called and discussed with husband, Elwin Sleight over the phone at [phone] Disposition Plan: Pending clinical  course Consults called: TOC Admission status: Stepdown, inpatient  Past Medical History:  Diagnosis Date   Acute drug-induced gout of left foot 03/01/2018   Last Assessment & Plan:  Likely at least partially brought on by diuresis.  Needs to continue to diurese  Will push hydration Stop allopurinol given initiation during acute flare may worsen this, re-broach this when asymptomatic Avoid nsaids given stomach pain Trial colchicine Add acetaminophen Ice, elevate, rest   Allergy    seasonal   Anxiety    Arthritis    Right Knee   Asthma    CHF (congestive heart failure) (HCC)    COPD (chronic obstructive pulmonary disease) (HCC)    Coronary artery disease    Leaky heart valve   Diabetes mellitus without complication (HCC)    Dysrhythmia    Fibromyalgia    GERD (gastroesophageal reflux disease)    Hypertension    Pneumonia    PUD (peptic ulcer disease)    Pulmonary HTN ([place])    Rheumatic fever/heart disease    Sleep apnea    Past Surgical History:  Procedure Laterality Date   ADENOIDECTOMY     COLONOSCOPY WITH PROPOFOL N/A 11/06/2019   Procedure: COLONOSCOPY WITH PROPOFOL;  Surgeon: Virgel Manifold, MD;  Location: [facility] ENDOSCOPY;  Service: Endoscopy;  Laterality: N/A;  2 day prep    COLONOSCOPY WITH PROPOFOL N/A 06/29/2022   Procedure: COLONOSCOPY WITH PROPOFOL;  Surgeon: Lin Landsman, MD;  Location: [facility] ENDOSCOPY;  Service: Gastroenterology;  Laterality: N/A;   CORONARY ARTERY BYPASS GRAFT     June 27 2018   ESOPHAGOGASTRODUODENOSCOPY (EGD) WITH PROPOFOL N/A 05/25/2018   Procedure: ESOPHAGOGASTRODUODENOSCOPY (EGD) WITH PROPOFOL;  Surgeon: Lucilla Lame, MD;  Location: [facility] ENDOSCOPY;  Service: Endoscopy;  Laterality: N/A;   ESOPHAGOGASTRODUODENOSCOPY (EGD) WITH PROPOFOL N/A 11/06/2019   Procedure: ESOPHAGOGASTRODUODENOSCOPY (EGD) WITH PROPOFOL;  Surgeon: Virgel Manifold, MD;  Location: [facility] ENDOSCOPY;  Service: Endoscopy;  Laterality: N/A;    ESOPHAGOGASTRODUODENOSCOPY (EGD) WITH PROPOFOL N/A 06/29/2022   Procedure: ESOPHAGOGASTRODUODENOSCOPY (EGD) WITH PROPOFOL;  Surgeon: Lin Landsman, MD;  Location: [facility] ENDOSCOPY;  Service: Gastroenterology;  Laterality: N/A;   GIVENS CAPSULE STUDY N/A 07/22/2022   Procedure: GIVENS CAPSULE STUDY;  Surgeon: Lin Landsman, MD;  Location: [name] ENDOSCOPY;  Service: Gastroenterology;  Laterality: N/A;   MITRAL VALVE REPLACEMENT     MULTIPLE TOOTH EXTRACTIONS     TONSILLECTOMY     TOTAL KNEE ARTHROPLASTY Right 09/04/2016   Procedure: TOTAL KNEE ARTHROPLASTY; with lateral release;  Surgeon: Earlie Server, MD;  Location: [place];  Service: Orthopedics;  Laterality: Right;   Social History:  reports that she has been smoking cigarettes. She has been smoking an average of .1 packs per day. She has never used smokeless tobacco. She reports that she does not currently use drugs. She reports that she does not drink alcohol.  Allergies  Allergen Reactions   Amiodarone Nausea And Vomiting   Aspirin Swelling   Flexeril [Cyclobenzaprine] Swelling   Trazamine [Trazodone & Diet Manage Prod] Nausea And Vomiting   Codeine Rash   Tramadol Rash   Family History  Problem Relation Age of Onset   COPD Mother    Cancer Mother        Bone   Asthma Mother    Rheum arthritis Mother    Congestive Heart Failure Father    Breast cancer Maternal Aunt    Family history: Family history reviewed and not pertinent  Prior to Admission medications   Medication Sig Start Date End Date Taking? Authorizing Provider  albuterol (VENTOLIN HFA) 108 (90 Base) MCG/ACT inhaler Inhale 1-2 puffs into the lungs every 6 (six) hours as needed for wheezing or shortness of breath.    [provider]  allopurinol (ZYLOPRIM) 100 MG tablet Take 1 tablet (100 mg total) by mouth 2 (two) times daily. 03/05/20   Milinda Pointer, MD  Cholecalciferol (VITAMIN D) 50 MCG (2000 UT) CAPS Take 1 capsule (2,000 Units total) by mouth  daily. 06/19/21   Alisa Graff, FNP  colchicine 0.6 MG tablet Take 1 tablet (0.6 mg total) by mouth daily. As directed 03/05/20   Milinda Pointer, MD  diazepam (VALIUM) 10 MG tablet Take 10 mg by mouth 2 (two) times daily as needed for anxiety. 07/16/22   [provider]  docusate sodium (COLACE) 100 MG capsule Take 100 mg by mouth 2 (two) times daily as needed for mild constipation or moderate constipation.    [provider]  empagliflozin (JARDIANCE) 10 MG TABS tablet Take 10 mg by mouth daily.    [provider]  furosemide (LASIX) 20 MG tablet Take 3 tablets (60 mg total) by mouth daily. 10/02/22 11/01/22  Wyvonnia Dusky, MD  gabapentin (NEURONTIN) 600 MG tablet Take 0.5 tablets (300 mg total)  by mouth 2 (two) times daily. Patient taking differently: Take 600 mg by mouth 3 (three) times daily. 12/06/18   Vaughan Basta, MD  ipratropium-albuterol (DUONEB) 0.5-2.5 (3) MG/3ML SOLN INHALE THE CONTENTS OF 1 VIAL(3MLS) VIA NEBULIZER 3 TIMES DAILY AS NEEDED 10/24/19   Carles Collet M, PA-C  Iron-FA-B Cmp-C-Biot-Probiotic (FUSION PLUS) CAPS Take 1 capsule by mouth daily. Patient not taking: Reported on 09/16/2022 06/25/22   Lin Landsman, MD  lactulose (CHRONULAC) 10 GM/15ML solution Take 45 mLs (30 g total) by mouth 2 (two) times daily. 10/01/22 10/31/22  Wyvonnia Dusky, MD  metolazone (ZAROXOLYN) 2.5 MG tablet Take 1 tablet (2.5 mg total) by mouth every Monday, Wednesday, and Friday. 10/02/22 11/01/22  Wyvonnia Dusky, MD  metoprolol tartrate (LOPRESSOR) 50 MG tablet Take 1 tablet (50 mg total) by mouth 2 (two) times daily. 10/01/22 10/31/22  Wyvonnia Dusky, MD  nitroGLYCERIN (NITROSTAT) 0.4 MG SL tablet Place 1 tablet (0.4 mg total) under the tongue every 5 (five) minutes as needed for chest pain. 06/19/21   Alisa Graff, FNP  omeprazole (PRILOSEC) 40 MG capsule Take 40 mg by mouth daily.    [provider]  oxyCODONE ER 9 MG C12A Take 9  mg by mouth 2 (two) times daily.    [provider]  potassium chloride (KLOR-CON) 10 MEQ tablet Take 4 tablets (40 mEq total) by mouth 2 (two) times daily. And additional 65mq on metolazone days Patient taking differently: Take 40 mEq by mouth See admin instructions. Take 4 tablets (457m) by mouth twice daily - take an additional 2 tablets (2046m by mouth when taking metolazone 01/05/22   HacAlisa GraffNP  promethazine (PHENERGAN) 12.5 MG tablet Take 1 tablet (12.5 mg total) by mouth every 8 (eight) hours as needed for nausea or vomiting. Patient taking differently: Take 12.5 mg by mouth 2 (two) times daily as needed for nausea or vomiting. 02/20/20   Bacigalupo, AngDionne BucyD  rifaximin (XIFAXAN) 550 MG TABS tablet Take 1 tablet (550 mg total) by mouth 2 (two) times daily. 10/01/22 10/31/22  WilWyvonnia DuskyD  rivaroxaban (XARELTO) 20 MG TABS tablet Take 20 mg by mouth daily with supper.    [provider]  rosuvastatin (CRESTOR) 10 MG tablet Take 10 mg by mouth daily.    [provider]  spironolactone (ALDACTONE) 25 MG tablet Take 0.5 tablets (12.5 mg total) by mouth daily. 10/02/22 11/01/22  WilWyvonnia DuskyD   Physical Exam: Vitals:   10/27/2022 1430 10/25/2022 1639 10/17/2022 1641 10/16/2022 1643  BP: (!) 129/93 (!) 89/62 (!) 63/54 (!) 54/41  Pulse: 99  91   Resp: (!) 21     Temp: 97.8 F (36.6 C)     TempSrc: Oral     SpO2: 98%      Constitutional: appears older than chronological age, frail, chronically ill, NAD Eyes: PERRL, lids and conjunctivae normal ENMT: Mucous membranes are moist. Posterior pharynx clear of any exudate or lesions. Age-appropriate dentition. Hearing appropriate Neck: normal, supple, no masses, no thyromegaly Respiratory: clear to auscultation bilaterally, no wheezing, no crackles. Normal respiratory effort. No accessory muscle use.  Cardiovascular: Regular rate and rhythm, no murmurs / rubs / gallops. No extremity edema. 2+ pedal  pulses. No carotid bruits.  Abdomen: Morbidly obese abdomen, no tenderness, no masses palpated, no hepatosplenomegaly. Bowel sounds positive.  Musculoskeletal: no clubbing / cyanosis. No joint deformity upper and lower extremities. Good ROM, no contractures, no atrophy. Normal muscle tone.  Skin: no rashes, lesions, ulcers. No induration Neurologic: Sensation intact.  Generalized weakness Psychiatric: Normal judgment and insight. Alert and oriented x 3.  Depressed mood.   EKG: independently reviewed, showing atrial fibrillation with rate of 110, QTc 456  Chest x-ray on Admission: I personally reviewed and I agree with radiologist reading as below.  CT HEAD WO CONTRAST (5MM)  Result Date: 10/26/2022 CLINICAL DATA:  Mental status change EXAM: CT HEAD WITHOUT CONTRAST TECHNIQUE: Contiguous axial images were obtained from the base of the skull through the vertex without intravenous contrast. RADIATION DOSE REDUCTION: This exam was performed according to the departmental dose-optimization program which includes automated exposure control, adjustment of the mA and/or kV according to patient size and/or use of iterative reconstruction technique. COMPARISON:  MRI brain February 01, 2022 and head CT January 31, 2020 FINDINGS: Brain: Tiny remote left cerebellar infarct. No evidence of acute infarction, hemorrhage, hydrocephalus, extra-axial collection or mass lesion/mass effect. Vascular: No hyperdense vessel or unexpected calcification. Skull: Similar appearance of the left inferior orbital wall fracture with herniation of fat through the fracture defect. Negative for acute fracture or focal lesion. Sinuses/Orbits: Visualized portions of the paranasal sinuses predominantly clear. Other: None IMPRESSION: 1. No acute intracranial abnormality. 2. Tiny remote left cerebellar infarct. 3. Similar appearance of the left inferior orbital wall fracture with herniation of fat through the fracture defect. Electronically Signed    By: Dahlia Bailiff M.D.   On: 10/24/2022 19:14   DG Chest Port 1 View  Result Date: 10/21/2022 CLINICAL DATA:  Shortness of breath. EXAM: PORTABLE CHEST 1 VIEW COMPARISON:  09/25/2022 FINDINGS: Stable enlarged cardiac silhouette, median sternotomy wires, prosthetic mitral valve and left atrial clip. Stable prominence of the interstitial markings with no Kerley lines. Possible small left pleural effusion. Thoracic spine degenerative changes. IMPRESSION: 1. Stable cardiomegaly and chronic interstitial lung disease with possible superimposed interstitial edema. 2. Possible small left pleural effusion. Electronically Signed   By: Claudie Revering M.D.   On: 10/02/2022 16:06    Labs on Admission: I have personally reviewed following labs CBC: Recent Labs  Lab 09/28/22 0628 09/29/22 0344 09/30/22 1049 10/01/22 0627 10/22/2022 1602  WBC 8.7 9.1 8.1 7.6 12.0*  NEUTROABS  --   --   --   --  9.4*  HGB 9.1* 9.3* 9.3* 9.1* 8.6*  HCT 32.0* 32.4* 32.8* 31.8* 30.7*  MCV 83.1 82.4 84.8 85.3 88.5  PLT 121* 124* 143* 132* 270   Basic Metabolic Panel: Recent Labs  Lab 09/28/22 0628 09/29/22 0344 09/30/22 0719 10/01/22 0627 10/25/2022 1602  NA 137 138 138 138 137  K 3.7 3.3* 3.5 4.4 5.5*  CL 90* 89* 89* 89* 90*  CO2 37* 39* 38* 37* 34*  GLUCOSE 80 78 105* 78 64*  BUN 37* 35* 33* 36* 50*  CREATININE 1.26* 1.23* 1.13* 1.28* 3.83*  CALCIUM 8.7* 8.7* 8.9 9.1 9.3  MG 1.8  --  1.7  --  2.2  PHOS 3.0  --   --   --   --    GFR: Estimated Creatinine Clearance: 19.7 mL/min (A) (by C-G formula based on SCr of 3.83 mg/dL (H)).  Liver Function Tests: Recent Labs  Lab 09/28/22 0628 09/29/22 0344 09/30/22 0719 10/01/22 0627 10/20/2022 1602  AST 50* 58* 61* 63* 70*  ALT _0 34 36  ALKPHOS 81 86 94 89 93  BILITOT 7.4* 7.6* 7.7* 7.8* 9.0*  PROT 6.3* 6.7 6.6 6.7 6.9  ALBUMIN 2.3* 2.4* 2.5* 2.4* 2.6*  Coagulation Profile: Recent Labs  Lab 09/29/22 0344  INR 1.4*   Urine analysis:    Component  Value Date/Time   COLORURINE BROWN (A) 10/11/2022 1641   APPEARANCEUR TURBID (A) 10/07/2022 1641   APPEARANCEUR CLEAR 02/20/2015 1015   LABSPEC 1.024 10/03/2022 1641   LABSPEC 1.016 02/20/2015 1015   PHURINE  10/20/2022 1641    TEST NOT REPORTED DUE TO COLOR INTERFERENCE OF URINE PIGMENT   GLUCOSEU (A) 10/05/2022 1641    TEST NOT REPORTED DUE TO COLOR INTERFERENCE OF URINE PIGMENT   GLUCOSEU NEGATIVE 02/20/2015 1015   HGBUR (A) 10/18/2022 1641    TEST NOT REPORTED DUE TO COLOR INTERFERENCE OF URINE PIGMENT   BILIRUBINUR (A) 10/18/2022 1641    TEST NOT REPORTED DUE TO COLOR INTERFERENCE OF URINE PIGMENT   BILIRUBINUR NEGATIVE 02/20/2015 1015   KETONESUR (A) 10/17/2022 1641    TEST NOT REPORTED DUE TO COLOR INTERFERENCE OF URINE PIGMENT   PROTEINUR (A) 10/17/2022 1641    TEST NOT REPORTED DUE TO COLOR INTERFERENCE OF URINE PIGMENT   NITRITE (A) 10/11/2022 1641    TEST NOT REPORTED DUE TO COLOR INTERFERENCE OF URINE PIGMENT   LEUKOCYTESUR (A) 10/28/2022 1641    TEST NOT REPORTED DUE TO COLOR INTERFERENCE OF URINE PIGMENT   LEUKOCYTESUR 2+ 02/20/2015 1015   CRITICAL CARE Performed by: Dr. Tobie Poet  Total critical care time: 35 minutes  Critical care time was exclusive of separately billable procedures and treating other patients.  Critical care was necessary to treat or prevent imminent or life-threatening deterioration.  Critical care was time spent personally by me on the following activities: development of treatment plan with patient and/or surrogate as well as nursing, discussions with consultants, evaluation of patient's response to treatment, examination of patient, obtaining history from patient or surrogate, ordering and performing treatments and interventions, ordering and review of laboratory studies, ordering and review of radiographic studies, pulse oximetry and re-evaluation of patient's condition.  Dr. Tobie Poet [facility]  If 7PM-7AM, please contact overnight-coverage  provider If 7AM-7PM, please contact day coverage provider [email or web address]  10/22/2022, 7:21 PM

## 2022-10-05 ENCOUNTER — Inpatient Hospital Stay: Payer: Medicaid Other

## 2022-10-05 DIAGNOSIS — R6520 Severe sepsis without septic shock: Secondary | ICD-10-CM | POA: Diagnosis not present

## 2022-10-05 DIAGNOSIS — A419 Sepsis, unspecified organism: Secondary | ICD-10-CM | POA: Diagnosis not present

## 2022-10-05 LAB — CBG MONITORING, ED: Glucose-Capillary: 85 mg/dL (ref 70–99)

## 2022-10-05 LAB — COMPREHENSIVE METABOLIC PANEL
ALT: 40 U/L (ref 0–44)
AST: 73 U/L — ABNORMAL HIGH (ref 15–41)
Albumin: 2.8 g/dL — ABNORMAL LOW (ref 3.5–5.0)
Alkaline Phosphatase: 91 U/L (ref 38–126)
Anion gap: 12 (ref 5–15)
BUN: 53 mg/dL — ABNORMAL HIGH (ref 6–20)
CO2: 32 mmol/L (ref 22–32)
Calcium: 9 mg/dL (ref 8.9–10.3)
Chloride: 93 mmol/L — ABNORMAL LOW (ref 98–111)
Creatinine, Ser: 3.87 mg/dL — ABNORMAL HIGH (ref 0.44–1.00)
GFR, Estimated: 13 mL/min — ABNORMAL LOW (ref >60)
Glucose, Bld: 93 mg/dL (ref 70–99)
Potassium: 5.2 mmol/L — ABNORMAL HIGH (ref 3.5–5.1)
Sodium: 137 mmol/L (ref 135–145)
Total Bilirubin: 9.4 mg/dL — ABNORMAL HIGH (ref 0.3–1.2)
Total Protein: 7.1 g/dL (ref 6.5–8.1)

## 2022-10-05 LAB — BLOOD GAS, ARTERIAL
Acid-Base Excess: 6.5 mmol/L — ABNORMAL HIGH (ref 0.0–2.0)
Acid-Base Excess: 6.2 mmol/L — ABNORMAL HIGH (ref 0.0–2.0)
Bicarbonate: 33.9 mmol/L — ABNORMAL HIGH (ref 20.0–28.0)
Bicarbonate: 32.7 mmol/L — ABNORMAL HIGH (ref 20.0–28.0)
FIO2: 28 %
O2 Saturation: 98.8 %
O2 Saturation: 96.9 %
Patient temperature: 37
Patient temperature: 37
pCO2 arterial: 60 mmHg — ABNORMAL HIGH (ref 32–48)
pCO2 arterial: 54 mmHg — ABNORMAL HIGH (ref 32–48)
pH, Arterial: 7.36 (ref 7.35–7.45)
pH, Arterial: 7.39 (ref 7.35–7.45)
pO2, Arterial: 112 mmHg — ABNORMAL HIGH (ref 83–108)
pO2, Arterial: 76 mmHg — ABNORMAL LOW (ref 83–108)

## 2022-10-05 LAB — URINE DRUG SCREEN, QUALITATIVE (ARMC ONLY)
Amphetamines, Ur Screen: NOT DETECTED
Barbiturates, Ur Screen: NOT DETECTED
Benzodiazepine, Ur Scrn: POSITIVE — AB
Cannabinoid 50 Ng, Ur ~~LOC~~: NOT DETECTED
Cocaine Metabolite,Ur ~~LOC~~: NOT DETECTED
MDMA (Ecstasy)Ur Screen: NOT DETECTED
Methadone Scn, Ur: NOT DETECTED
Opiate, Ur Screen: POSITIVE — AB
Phencyclidine (PCP) Ur S: NOT DETECTED
Tricyclic, Ur Screen: POSITIVE — AB

## 2022-10-05 LAB — MRSA NEXT GEN BY PCR, NASAL: MRSA by PCR Next Gen: NOT DETECTED

## 2022-10-05 LAB — COOXEMETRY PANEL
Carboxyhemoglobin: 2.5 % — ABNORMAL HIGH (ref 0.5–1.5)
Methemoglobin: <0.7 % (ref 0.0–1.5)
O2 Saturation: 60 %
Total hemoglobin: 9.5 g/dL — ABNORMAL LOW (ref 12.0–16.0)
Total oxygen content: 58.4 %

## 2022-10-05 LAB — CBC
HCT: 32.5 % — ABNORMAL LOW (ref 36.0–46.0)
Hemoglobin: 9 g/dL — ABNORMAL LOW (ref 12.0–15.0)
MCH: 24.6 pg — ABNORMAL LOW (ref 26.0–34.0)
MCHC: 27.7 g/dL — ABNORMAL LOW (ref 30.0–36.0)
MCV: 88.8 fL (ref 80.0–100.0)
Platelets: 290 10*3/uL (ref 150–400)
RBC: 3.66 MIL/uL — ABNORMAL LOW (ref 3.87–5.11)
RDW: 26.9 % — ABNORMAL HIGH (ref 11.5–15.5)
WBC: 12.7 10*3/uL — ABNORMAL HIGH (ref 4.0–10.5)
nRBC: 0.4 % — ABNORMAL HIGH (ref 0.0–0.2)

## 2022-10-05 LAB — GLUCOSE, CAPILLARY
Glucose-Capillary: 96 mg/dL (ref 70–99)
Glucose-Capillary: 82 mg/dL (ref 70–99)
Glucose-Capillary: 98 mg/dL (ref 70–99)

## 2022-10-05 LAB — LACTIC ACID, PLASMA
Lactic Acid, Venous: 1.8 mmol/L (ref 0.5–1.9)
Lactic Acid, Venous: 1.3 mmol/L (ref 0.5–1.9)

## 2022-10-05 LAB — PROTIME-INR
INR: 1.5 — ABNORMAL HIGH (ref 0.8–1.2)
Prothrombin Time: 18 seconds — ABNORMAL HIGH (ref 11.4–15.2)

## 2022-10-05 LAB — AMMONIA: Ammonia: 67 umol/L — ABNORMAL HIGH (ref 9–35)

## 2022-10-05 LAB — T4, FREE: Free T4: 0.98 ng/dL (ref 0.61–1.12)

## 2022-10-05 LAB — TSH: TSH: 8.916 u[IU]/mL — ABNORMAL HIGH (ref 0.350–4.500)

## 2022-10-05 LAB — VITAMIN B12: Vitamin B-12: 1380 pg/mL — ABNORMAL HIGH (ref 180–914)

## 2022-10-05 LAB — PROCALCITONIN: Procalcitonin: 0.53 ng/mL

## 2022-10-05 MED ORDER — NOREPINEPHRINE 4 MG/250ML-% IV SOLN
2.0000 ug/min | INTRAVENOUS | Status: DC
  Administered 2022-10-05: 3 ug/min via INTRAVENOUS
  Administered 2022-10-05: 10 ug/min via INTRAVENOUS
  Filled 2022-10-05 (×2): qty 250

## 2022-10-05 MED ORDER — MIDAZOLAM HCL 2 MG/2ML IJ SOLN
1.0000 mg | Freq: Once | INTRAMUSCULAR | Status: AC
  Administered 2022-10-05: 1 mg via INTRAVENOUS

## 2022-10-05 MED ORDER — FENTANYL CITRATE PF 50 MCG/ML IJ SOSY
PREFILLED_SYRINGE | INTRAMUSCULAR | Status: AC
  Administered 2022-10-05: 25 ug via INTRAVENOUS
  Filled 2022-10-05: qty 1

## 2022-10-05 MED ORDER — SODIUM CHLORIDE 0.9 % IV SOLN: 250.0000 mL | INTRAVENOUS | Status: DC

## 2022-10-05 MED ORDER — MIDAZOLAM HCL 2 MG/2ML IJ SOLN: 1.0000 mg | Freq: Once | INTRAMUSCULAR | Status: AC

## 2022-10-05 MED ORDER — FENTANYL CITRATE PF 50 MCG/ML IJ SOSY: 25.0000 ug | PREFILLED_SYRINGE | Freq: Once | INTRAMUSCULAR | Status: AC

## 2022-10-05 MED ORDER — FENTANYL CITRATE PF 50 MCG/ML IJ SOSY
12.5000 ug | PREFILLED_SYRINGE | Freq: Once | INTRAMUSCULAR | Status: AC
  Administered 2022-10-05: 12.5 ug via INTRAVENOUS

## 2022-10-05 MED ORDER — PIPERACILLIN-TAZOBACTAM IN DEX 2-0.25 GM/50ML IV SOLN
2.2500 g | Freq: Three times a day (TID) | INTRAVENOUS | Status: DC
  Administered 2022-10-05 – 2022-10-06 (×3): 2.25 g via INTRAVENOUS
  Filled 2022-10-05 (×4): qty 50

## 2022-10-05 MED ORDER — NOREPINEPHRINE 16 MG/250ML-% IV SOLN
0.0000 ug/min | INTRAVENOUS | Status: DC
  Administered 2022-10-05: 12 ug/min via INTRAVENOUS
  Administered 2022-10-06: 5 ug/min via INTRAVENOUS
  Administered 2022-10-08: 8 ug/min via INTRAVENOUS
  Filled 2022-10-05 (×4): qty 250

## 2022-10-05 MED ORDER — FENTANYL CITRATE PF 50 MCG/ML IJ SOSY
12.5000 ug | PREFILLED_SYRINGE | INTRAMUSCULAR | Status: DC | PRN
  Administered 2022-10-05 – 2022-10-08 (×11): 25 ug via INTRAVENOUS
  Filled 2022-10-05 (×11): qty 1

## 2022-10-05 MED ORDER — CHLORHEXIDINE GLUCONATE CLOTH 2 % EX PADS
6.0000 | MEDICATED_PAD | Freq: Every day | CUTANEOUS | Status: DC
  Administered 2022-10-05 – 2022-10-10 (×5): 6 via TOPICAL

## 2022-10-05 MED ORDER — LACTULOSE 10 GM/15ML PO SOLN
30.0000 g | Freq: Two times a day (BID) | ORAL | Status: DC
  Administered 2022-10-05 – 2022-10-08 (×7): 30 g via ORAL
  Filled 2022-10-05 (×7): qty 60

## 2022-10-05 MED ORDER — HEPARIN (PORCINE) 25000 UT/250ML-% IV SOLN
1350.0000 [IU]/h | INTRAVENOUS | Status: DC
  Administered 2022-10-05 – 2022-10-06 (×2): 1250 [IU]/h via INTRAVENOUS
  Administered 2022-10-07: 1450 [IU]/h via INTRAVENOUS
  Administered 2022-10-08: 1350 [IU]/h via INTRAVENOUS
  Filled 2022-10-05 (×4): qty 250

## 2022-10-05 MED ORDER — HEPARIN BOLUS VIA INFUSION
4000.0000 [IU] | Freq: Once | INTRAVENOUS | Status: AC
  Administered 2022-10-05: 4000 [IU] via INTRAVENOUS
  Filled 2022-10-05: qty 4000

## 2022-10-05 MED ORDER — VASOPRESSIN 20 UNITS/100 ML INFUSION FOR SHOCK
0.0000 [IU]/min | INTRAVENOUS | Status: DC
  Administered 2022-10-05 – 2022-10-09 (×8): 0.03 [IU]/min via INTRAVENOUS
  Filled 2022-10-05 (×8): qty 100

## 2022-10-05 MED ORDER — MIDAZOLAM HCL 2 MG/2ML IJ SOLN
INTRAMUSCULAR | Status: AC
  Administered 2022-10-05: 1 mg via INTRAVENOUS
  Filled 2022-10-05: qty 2

## 2022-10-05 NOTE — Progress Notes (Signed)
Progress Note   Patient: Chelsea Ramsey JQB:[id number] DOB: Jan 28, 1966 DOA: 10/01/2022     1 DOS: the patient was seen and examined on 10/05/2022   Brief hospital course: [name] is a 56 year old female with history of morbid obesity, history of drug use including cocaine and THC, history of EtOH abuse, neuropathy, depression, atrial fibrillation on Xarelto, hyperlipidemia, hypertension, who presented on 10/27/2022 for evaluation or progressively worsening shortness of breath.  Recent admissions for similar presentation, discharge most recently on 11/2.  Prior admission records reviewed.  Patient has had hyperbilirubinemia secondary to congestive hepatopathy.  Prior admissions, GI and hematology/oncology were consulted and evaluation was unrevealing.  Possible acalculous cholecystitis was in the differential, but patient poor candidate for surgery or procedures given poor cardiac function and pulmonary hypertension, morbid obesity also a complicating factor.  PCCM was consulted follow admission and assumed care due to circulatory shock requiring vasopressors.    Patient has poor prognosis. Code status is DNR.   High risk for morbidity and mortality.  Case discussed with PCCM today.    TRH will continue to follow peripherally and resume as primary once patient is hemodynamically stable.   Assessment and Plan: * Severe sepsis with acute organ dysfunction ([place]) - Patient meets criteria for severe sepsis with increased respiration rate, heart rate, leukocytosis, elevated lactic acid of 2.1, organ involvement are respiratory and renal with hypotension - Blood cultures x2 are in process, procalcitonin are in process - Check a urine culture - Patient initially was hypotensive and responded to sodium chloride 1 L bolus - Ordered additional sodium chloride 1 L bolus, 2 L total - Continue with cefepime, vancomycin - Discussed with cross coverage provider - Admit to stepdown,  inpatient  Altered mental status - Query metabolic encephalopathy - Per husband, patient is a strong and since about 2 months ago, after procedure, she has not been the same since.  He reports that she was able to walk before even weekly and with assistance.  He reports that she no murmurs nearly incoherently and has difficulty walking.  Now she is mostly bedbound except for walking to the restroom - CT scan of the head without contrast has been ordered - Check UDS, urine culture, B1, B12, check ammonia - There may also be a component of polypharmacy as patient is on Valium 10 mg p.o. twice daily for anxiety, oxycodone 9 mg twice daily, gabapentin 300 mg twice daily and with acute kidney injury this may contribute to patient being altered - I did not resume home Valium on admission - I ordered Ativan 1 mg every 4 hours as needed for anxiety and seizure - We do not have oxycodone 9 mg on formulary and given that patient has altered mental status and hypotensive, judicious use will be in place - Oxycodone/acetaminophen 5-325 mg p.o. every 8 hours.  For moderate-severe pain ordered 2 doses - AM team to reevaluate patient at bedside in the a.m. to determine continued opioid requirements - Cross coverage provider aware  Chronic anticoagulation Chelsea Ramsey) - Patient has EGFR of 13 on admission - We will order heparin 5000 units subcutaneous every 8 hours at this time - AM team to resume Xarelto when benefit outweigh the risk  HTN (hypertension) - Home antihypertensive medications not resumed on admission due to severe sepsis with hypotension on initial presentation - Hydralazine 5 mg IV every 8 hours.  For SBP greater than 180, 4 days ordered  A-fib ([place]) - Home Xarelto not resumed on admission,  EGFR is 13  Hepatic steatosis - Lactulose not resumed on admission due to acute kidney injury - Rifaximin resumed  Hypotension - Appears to have responded to IV fluid bolus, 1 L of sodium chloride per  EDP - I ordered additional 2 L of sodium chloride to complete sepsis bolus - Sodium chloride 150 mL/h ordered - Midodrine 5 mg p.o. one-time dose ordered  Insomnia - Melatonin 5 mg nightly as needed for sleep ordered  History of substance abuse (Chelsea Ramsey) - Patient states that she has not smoked, drink alcohol, use cocaine for 13 years  GAD (generalized anxiety disorder) - Home Valium not resumed on admission - Ativan 1 mg IV every 4 hours.  Procedure and anxiety, 4 doses ordered      {Tip this will not be part of the note when signed Body mass index is 40.23 kg/m. ,  Nutrition Documentation    Flowsheet Row ED to Hosp-Admission (Current) from 10/20/2022 in [place] [facility]  Nutrition Problem Inadequate oral intake  Etiology acute illness  [AMS]  Nutrition Goal Patient will meet greater than or equal to 90% of their needs     ,  (Optional):26781}  Subjective: ***  Physical Exam: Vitals:   10/05/22 1800 10/05/22 1815 10/05/22 1830 10/05/22 1845  BP: (!) 85/61     Pulse: (!) 56 (!) 117 94 91  Resp: 19 (!) 30 20 (!) 21  Temp:      TempSrc:      SpO2: 93% 93% 93% 94%  Weight:      Height:       *** Data Reviewed: {Tip this will not be part of the note when signed- Document your independent interpretation of telemetry tracing, EKG, lab, Radiology test or any other diagnostic tests. Add any new diagnostic test ordered today. (Optional):26781} {Results:26384}  Family Communication: ***  Disposition: Status is: Inpatient {Inpatient:23812}  Planned Discharge Destination: {DISCHARGE DESTINATION_TRH:27031} {Tip this will not be part of the note when signed  DVT Prophylaxis  ., Place ted hose  (Optional):26781}   Time spent: *** minutes  Author: Ezekiel Slocumb, DO 10/05/2022 8:31 PM  For on call review [email or web address].

## 2022-10-05 NOTE — Consult Note (Signed)
[facility]  CONSULT NOTE    Date: 10/05/2022                  Patient Name:  Chelsea Ramsey  MRN: [id number]  DOB: Oct 08, 1966  Age / Sex: 56 y.o., female         PCP: Emelia Loron, NP                 Service Requesting Consult: Critical care                 Reason for Consult: Acute kidney injury            History of Present Illness: Chelsea Ramsey is a 56 y.o.  female with past medical conditions including drug and alcohol abuse, hypertension, hyperlipidemia, depression and atrial fib, who was admitted to [facility] on 10/07/2022 for Generalized weakness [R53.1] AKI (acute kidney injury) ([place]) [N17.9] Severe sepsis with acute organ dysfunction ([place]) [A41.9, R65.20] Acute on chronic respiratory failure with hypoxia ([place]) [J96.21] Sepsis, due to unspecified organism, unspecified whether acute organ dysfunction present [name]) [A41.9]  Patient presented to the ED with complaints of shortness of breath.  She reports to the ED by EMS with husband at bedside.  Husband states patient has been bedbound since last admission.  Patient is seen and evaluated at bedside in ICU.  Currently no family at bedside.  Patient's main complaint today involves itching.  Currently on 3 L nasal cannula.  Alert and oriented.  Denies recent drug or alcohol use.   Pertinent labs on ED arrival include potassium 5 serum bicarb 34, glucose 64 BUN 50, creatinine 3.83 with GFR 13, albumin 2.6 BMP 785 with troponin 51, lactic acid 2.1, and hemoglobin 8.6.  Urinalysis inconclusive due to turbidity.  Urine tox positive for benzodiazepines, opiates, and tricyclic's.  Urine culture pending.  Blood cultures pending.  CT head shows a tiny remote left cerebral infarct.Abdominal ultrasound shows a small amount of gallbladder sludge with mild gallbladder wall thickening, no gallstones.  Currently receiving levo for blood pressure support   Medications: Outpatient  medications: Medications Prior to Admission  Medication Sig Dispense Refill Last Dose   albuterol (VENTOLIN HFA) 108 (90 Base) MCG/ACT inhaler Inhale 1-2 puffs into the lungs every 6 (six) hours as needed for wheezing or shortness of breath.   prn   allopurinol (ZYLOPRIM) 100 MG tablet Take 1 tablet (100 mg total) by mouth 2 (two) times daily. 60 tablet 5 10/02/2022   Cholecalciferol (VITAMIN D) 50 MCG (2000 UT) CAPS Take 1 capsule (2,000 Units total) by mouth daily. 30 capsule 5 10/02/2022   colchicine 0.6 MG tablet Take 1 tablet (0.6 mg total) by mouth daily. As directed 30 tablet 5 prn   diazepam (VALIUM) 10 MG tablet Take 10 mg by mouth 2 (two) times daily as needed for anxiety.   prn   docusate sodium (COLACE) 100 MG capsule Take 100 mg by mouth 2 (two) times daily as needed for mild constipation or moderate constipation.   prn   empagliflozin (JARDIANCE) 10 MG TABS tablet Take 10 mg by mouth daily.   10/02/2022   furosemide (LASIX) 20 MG tablet Take 3 tablets (60 mg total) by mouth daily. 90 tablet 0 10/02/2022   gabapentin (NEURONTIN) 600 MG tablet Take 0.5 tablets (300 mg total) by mouth 2 (two) times daily. (Patient taking differently: Take 600 mg by mouth 3 (three) times daily.) 60 tablet 0 prn   ipratropium-albuterol (DUONEB) 0.5-2.5 (  3) MG/3ML SOLN INHALE THE CONTENTS OF 1 VIAL(3MLS) VIA NEBULIZER 3 TIMES DAILY AS NEEDED 360 mL 1 prn   Iron-FA-B Cmp-C-Biot-Probiotic (FUSION PLUS) CAPS Take 1 capsule by mouth daily. (Patient not taking: Reported on 09/16/2022) 30 capsule 3    lactulose (CHRONULAC) 10 GM/15ML solution Take 45 mLs (30 g total) by mouth 2 (two) times daily. 2700 mL 0 10/02/2022   metolazone (ZAROXOLYN) 2.5 MG tablet Take 1 tablet (2.5 mg total) by mouth every Monday, Wednesday, and Friday. 12 tablet 0 10/02/2022   metoprolol tartrate (LOPRESSOR) 50 MG tablet Take 1 tablet (50 mg total) by mouth 2 (two) times daily. 60 tablet 0 10/02/2022   nitroGLYCERIN (NITROSTAT) 0.4 MG SL tablet  Place 1 tablet (0.4 mg total) under the tongue every 5 (five) minutes as needed for chest pain. 30 tablet 12 prn   omeprazole (PRILOSEC) 40 MG capsule Take 40 mg by mouth daily.   10/02/2022   oxyCODONE ER 9 MG C12A Take 9 mg by mouth 2 (two) times daily.   10/02/2022   potassium chloride (KLOR-CON) 10 MEQ tablet Take 4 tablets (40 mEq total) by mouth 2 (two) times daily. And additional 29mq on metolazone days (Patient taking differently: Take 40 mEq by mouth See admin instructions. Take 4 tablets (462m) by mouth twice daily - take an additional 2 tablets (2012m by mouth when taking metolazone) 64 tablet 5 10/02/2022   promethazine (PHENERGAN) 12.5 MG tablet Take 1 tablet (12.5 mg total) by mouth every 8 (eight) hours as needed for nausea or vomiting. (Patient taking differently: Take 12.5 mg by mouth 2 (two) times daily as needed for nausea or vomiting.) 30 tablet 0 prn   rifaximin (XIFAXAN) 550 MG TABS tablet Take 1 tablet (550 mg total) by mouth 2 (two) times daily. 60 tablet 0 10/02/2022   rivaroxaban (XARELTO) 20 MG TABS tablet Take 20 mg by mouth daily with supper.   10/02/2022   rosuvastatin (CRESTOR) 10 MG tablet Take 10 mg by mouth daily.   10/02/2022   spironolactone (ALDACTONE) 25 MG tablet Take 0.5 tablets (12.5 mg total) by mouth daily. 15 tablet 0 10/02/2022    Current medications: Current Facility-Administered Medications  Medication Dose Route Frequency Provider Last Rate Last Admin   0.9 %  sodium chloride infusion   Intravenous Continuous Cox, Amy N, DO   Stopped at 10/05/22 1121   0.9 %  sodium chloride infusion  250 mL Intravenous Continuous Foust, Katy L, NP       acetaminophen (TYLENOL) tablet 650 mg  650 mg Oral Q6H PRN Cox, Amy N, DO       Or   acetaminophen (TYLENOL) suppository 650 mg  650 mg Rectal Q6H PRN Cox, Amy N, DO       Chlorhexidine Gluconate Cloth 2 % PADS 6 each  6 each Topical Daily Cox, Amy N, DO   6 each at 10/05/22 0942   docusate sodium (COLACE) capsule 100  mg  100 mg Oral BID PRN Cox, Amy N, DO       heparin injection 5,000 Units  5,000 Units Subcutaneous Q8H Cox, Amy N, DO   5,000 Units at 10/05/22 1441   hydrALAZINE (APRESOLINE) injection 5 mg  5 mg Intravenous Q8H PRN Cox, Amy N, DO       lactulose (CHRONULAC) 10 GM/15ML solution 30 g  30 g Oral BID AleOttie GlazierD   30 g at 10/05/22 1436   LORazepam (ATIVAN) injection 1 mg  1 mg Intravenous  Q4H PRN Cox, Amy N, DO       melatonin tablet 5 mg  5 mg Oral QHS PRN Cox, Amy N, DO   5 mg at 10/29/2022 2251   nitroGLYCERIN (NITROSTAT) SL tablet 0.4 mg  0.4 mg Sublingual Q5 min PRN Cox, Amy N, DO       norepinephrine (LEVOPHED) 16 mg in 276m (0.064 mg/mL) premix infusion  0-40 mcg/min Intravenous Titrated CBenita Gutter RPH 11.25 mL/hr at 10/05/22 1440 12 mcg/min at 10/05/22 1440   oxyCODONE-acetaminophen (PERCOCET/ROXICET) 5-325 MG per tablet 1 tablet  1 tablet Oral Q8H PRN Cox, Amy N, DO   1 tablet at 10/16/2022 2252   pantoprazole (PROTONIX) EC tablet 80 mg  80 mg Oral Daily Cox, Amy N, DO       piperacillin-tazobactam (ZOSYN) IVPB 2.25 g  2.25 g Intravenous Q8H Aleskerov, Fuad, MD       rifaximin (XIFAXAN) tablet 550 mg  550 mg Oral BID Cox, Amy N, DO   550 mg at 10/20/2022 2250   rosuvastatin (CRESTOR) tablet 10 mg  10 mg Oral Daily Cox, Amy N, DO       senna-docusate (Senokot-S) tablet 1 tablet  1 tablet Oral QHS PRN Cox, Amy N, DO       thiamine (VITAMIN B1) injection 100 mg  100 mg Intravenous Daily Cox, Amy N, DO   100 mg at 10/05/22 09371     Allergies: Allergies  Allergen Reactions   Amiodarone Nausea And Vomiting   Aspirin Swelling   Flexeril [Cyclobenzaprine] Swelling   Trazamine [Trazodone & Diet Manage Prod] Nausea And Vomiting   Codeine Rash   Tramadol Rash      Past Medical History: Past Medical History:  Diagnosis Date   Acute drug-induced gout of left foot 03/01/2018   Last Assessment & Plan:  Likely at least partially brought on by diuresis.  Needs to continue to  diurese  Will push hydration Stop allopurinol given initiation during acute flare may worsen this, re-broach this when asymptomatic Avoid nsaids given stomach pain Trial colchicine Add acetaminophen Ice, elevate, rest   Allergy    seasonal   Anxiety    Arthritis    Right Knee   Asthma    CHF (congestive heart failure) (HCC)    COPD (chronic obstructive pulmonary disease) (HCC)    Coronary artery disease    Leaky heart valve   Diabetes mellitus without complication (HCC)    Dysrhythmia    Fibromyalgia    GERD (gastroesophageal reflux disease)    Hypertension    Pneumonia    PUD (peptic ulcer disease)    Pulmonary HTN (HCC)    Rheumatic fever/heart disease    Sleep apnea      Past Surgical History: Past Surgical History:  Procedure Laterality Date   ADENOIDECTOMY     COLONOSCOPY WITH PROPOFOL N/A 11/06/2019   Procedure: COLONOSCOPY WITH PROPOFOL;  Surgeon: TVirgel Manifold MD;  Location: [facility] ENDOSCOPY;  Service: Endoscopy;  Laterality: N/A;  2 day prep    COLONOSCOPY WITH PROPOFOL N/A 06/29/2022   Procedure: COLONOSCOPY WITH PROPOFOL;  Surgeon: VLin Landsman MD;  [facility];  Service: Gastroenterology;  Laterality: N/A;   CORONARY ARTERY BYPASS GRAFT     June 27 2018   ESOPHAGOGASTRODUODENOSCOPY (EGD) WITH PROPOFOL N/A 05/25/2018   Procedure: ESOPHAGOGASTRODUODENOSCOPY (EGD) WITH PROPOFOL;  Surgeon: WLucilla Lame MD;  [facility];  Service: Endoscopy;  Laterality: N/A;   ESOPHAGOGASTRODUODENOSCOPY (EGD) WITH PROPOFOL N/A 11/06/2019  Procedure: ESOPHAGOGASTRODUODENOSCOPY (EGD) WITH PROPOFOL;  Surgeon: Virgel Manifold, MD;  Location: [facility] ENDOSCOPY;  Service: Endoscopy;  Laterality: N/A;   ESOPHAGOGASTRODUODENOSCOPY (EGD) WITH PROPOFOL N/A 06/29/2022   Procedure: ESOPHAGOGASTRODUODENOSCOPY (EGD) WITH PROPOFOL;  Surgeon: Lin Landsman, MD;  Location[facility] ENDOSCOPY;  Service: Gastroenterology;  Laterality: N/A;   GIVENS CAPSULE STUDY  N/A 07/22/2022   Procedure: GIVENS CAPSULE STUDY;  Surgeon: Lin Landsman, MD;  Location: [facility] ENDOSCOPY;  Service: Gastroenterology;  Laterality: N/A;   MITRAL VALVE REPLACEMENT     MULTIPLE TOOTH EXTRACTIONS     TONSILLECTOMY     TOTAL KNEE ARTHROPLASTY Right 09/04/2016   Procedure: TOTAL KNEE ARTHROPLASTY; with lateral release;  Surgeon: Earlie Server, MD;  Location: [place];  Service: Orthopedics;  Laterality: Right;     Family History: Family History  Problem Relation Age of Onset   COPD Mother    Cancer Mother        Bone   Asthma Mother    Rheum arthritis Mother    Congestive Heart Failure Father    Breast cancer Maternal Aunt      Social History: Social History   Socioeconomic History   Marital status: Married    Spouse name: Not on file   Number of children: Not on file   Years of education: Not on file   Highest education level: Not on file  Occupational History   Occupation: disabled  Tobacco Use   Smoking status: Every Day    Packs/day: 0.10    Types: Cigarettes   Smokeless tobacco: Never   Tobacco comments:    4/20 was not able to quit.  Still at 1-3 a day. She would like to wait to set a new quit date until we get back to class to have the extra in person support  Vaping Use   Vaping Use: Never used  Substance and Sexual Activity   Alcohol use: Never    Alcohol/week: 3.0 standard drinks of alcohol    Types: 3 Cans of beer per week    Comment: 16 oz per week   Drug use: Not Currently    Comment: Previous use of cocaine and marijuana last use 07/31/16    Sexual activity: Not Currently  Other Topics Concern   Not on file  Social History Narrative   Not on file   Social Determinants of Health   Financial Resource Strain: Low Risk  (10/02/2019)   Overall Financial Resource Strain (CARDIA)    Difficulty of Paying Living Expenses: Not hard at all  Food Insecurity: No Food Insecurity (09/18/2022)   Hunger Vital Sign    Worried About Running Out of  Food in the Last Year: Never true    Ran Out of Food in the Last Year: Never true  Transportation Needs: No Transportation Needs (09/18/2022)   PRAPARE - Hydrologist (Medical): No    Lack of Transportation (Non-Medical): No  Physical Activity: Insufficiently Active (10/02/2019)   Exercise Vital Sign    Days of Exercise per Week: 7 days    Minutes of Exercise per Session: 20 min  Stress: No Stress Concern Present (10/02/2019)   [place]    Feeling of Stress : Not at all  Social Connections: Moderately Integrated (10/02/2019)   Social Connection and Isolation Panel [NHANES]    Frequency of Communication with Friends and Family: More than three times a week    Frequency of Social Gatherings  with Friends and Family: More than three times a week    Attends Religious Services: 1 to 4 times per year    Active Member of Clubs or Organizations: Yes    Attends Archivist Meetings: 1 to 4 times per year    Marital Status: Never married  Intimate Partner Violence: Not At Risk (09/18/2022)   Humiliation, Afraid, Rape, and Kick questionnaire    Fear of Current or Ex-Partner: No    Emotionally Abused: No    Physically Abused: No    Sexually Abused: No     Review of Systems: Review of Systems  Constitutional:  Negative for chills, fever and malaise/fatigue.  HENT:  Negative for congestion, sore throat and tinnitus.   Eyes:  Negative for blurred vision and redness.  Respiratory:  Positive for shortness of breath. Negative for cough and wheezing.   Cardiovascular:  Negative for chest pain, palpitations, claudication and leg swelling.  Gastrointestinal:  Negative for abdominal pain, blood in stool, diarrhea, nausea and vomiting.  Genitourinary:  Negative for flank pain, frequency and hematuria.  Musculoskeletal:  Negative for back pain, falls and myalgias.  Skin:  Positive for itching.  Negative for rash.  Neurological:  Negative for dizziness, weakness and headaches.  Endo/Heme/Allergies:  Does not bruise/bleed easily.  Psychiatric/Behavioral:  Negative for depression. The patient is not nervous/anxious and does not have insomnia.     Vital Signs: Blood pressure (!) 78/66, pulse 83, temperature 97.6 F (36.4 C), temperature source Oral, resp. rate 19, last menstrual period 11/08/2009, SpO2 (!) 60 %.  Weight trends: There were no vitals filed for this visit.  Physical Exam: General: NAD, restless  Head: Normocephalic, atraumatic. Moist oral mucosal membranes  Eyes: Anicteric  Lungs:  Clear to auscultation, normal effort, [place] O2  Heart: Irregular rate and rhythm  Abdomen:  Soft, nontender  Extremities: 1+ peripheral edema.  Neurologic: Nonfocal, moving all four extremities  Skin: No lesions  Access: None     Lab results: Basic Metabolic Panel: Recent Labs  Lab 09/30/22 0719 10/01/22 0627 10/02/2022 1602 10/05/22 0306  NA 138 138 137 137  K 3.5 4.4 5.5* 5.2*  CL 89* 89* 90* 93*  CO2 38* 37* 34* 32  GLUCOSE 105* 78 64* 93  BUN 33* 36* 50* 53*  CREATININE 1.13* 1.28* 3.83* 3.87*  CALCIUM 8.9 9.1 9.3 9.0  MG 1.7  --  2.2  --     Liver Function Tests: Recent Labs  Lab 10/01/22 0627 10/26/2022 1602 10/05/22 0306  AST 63* 70* 73*  ALT 34 36 40  ALKPHOS 89 93 91  BILITOT 7.8* 9.0* 9.4*  PROT 6.7 6.9 7.1  ALBUMIN 2.4* 2.6* 2.8*   No results for input(s): "LIPASE", "AMYLASE" in the last 168 hours. Recent Labs  Lab 10/05/22 0306  AMMONIA 67*    CBC: Recent Labs  Lab 09/29/22 0344 09/30/22 1049 10/01/22 0627 10/15/2022 1602 10/05/22 0306  WBC 9.1 8.1 7.6 12.0* 12.7*  NEUTROABS  --   --   --  9.4*  --   HGB 9.3* 9.3* 9.1* 8.6* 9.0*  HCT 32.4* 32.8* 31.8* 30.7* 32.5*  MCV 82.4 84.8 85.3 88.5 88.8  PLT 124* 143* 132* 258 290    Cardiac Enzymes: No results for input(s): "CKTOTAL", "CKMB", "CKMBINDEX", "TROPONINI" in the last 168  hours.  BNP: Invalid input(s): "POCBNP"  CBG: Recent Labs  Lab 10/05/22 0058 10/05/22 0815  GLUCAP 85 96    Microbiology: Results for orders placed or performed during  the hospital encounter of 10/24/2022  Resp Panel by RT-PCR (Flu A&B, Covid) Anterior Nasal Swab     Status: None   Collection Time: 10/07/2022  3:44 PM   Specimen: Anterior Nasal Swab  Result Value Ref Range Status   SARS Coronavirus 2 by RT PCR NEGATIVE NEGATIVE Final    Comment: (NOTE) SARS-CoV-2 target nucleic acids are NOT DETECTED.  The SARS-CoV-2 RNA is generally detectable in upper respiratory specimens during the acute phase of infection. The lowest concentration of SARS-CoV-2 viral copies this assay can detect is 138 copies/mL. A negative result does not preclude SARS-Cov-2 infection and should not be used as the sole basis for treatment or other patient management decisions. A negative result may occur with  improper specimen collection/handling, submission of specimen other than nasopharyngeal swab, presence of viral mutation(s) within the areas targeted by this assay, and inadequate number of viral copies(<138 copies/mL). A negative result must be combined with clinical observations, patient history, and epidemiological information. The expected result is Negative.  Fact Sheet for Patients:  EntrepreneurPulse.com.au  Fact Sheet for Healthcare Providers:  IncredibleEmployment.be  This test is no t yet approved or cleared by the [place] FDA and  has been authorized for detection and/or diagnosis of SARS-CoV-2 by FDA under an Emergency Use Authorization (EUA). This EUA will remain  in effect (meaning this test can be used) for the duration of the COVID-19 declaration under Section 564(b)(1) of the Act, 21 U.S.C.section 360bbb-3(b)(1), unless the authorization is terminated  or revoked sooner.       Influenza A by PCR NEGATIVE NEGATIVE Final   Influenza B  by PCR NEGATIVE NEGATIVE Final    Comment: (NOTE) The Xpert Xpress SARS-CoV-2/FLU/RSV plus assay is intended as an aid in the diagnosis of influenza from Nasopharyngeal swab specimens and should not be used as a sole basis for treatment. Nasal washings and aspirates are unacceptable for Xpert Xpress SARS-CoV-2/FLU/RSV testing.  Fact Sheet for Patients: EntrepreneurPulse.com.au  Fact Sheet for Healthcare Providers: IncredibleEmployment.be  This test is not yet approved or cleared by the [place] FDA and has been authorized for detection and/or diagnosis of SARS-CoV-2 by FDA under an Emergency Use Authorization (EUA). This EUA will remain in effect (meaning this test can be used) for the duration of the COVID-19 declaration under Section 564(b)(1) of the Act, 21 U.S.C. section 360bbb-3(b)(1), unless the authorization is terminated or revoked.  Performed at [facility], [address] [place], [place] [address]   MRSA Next Gen by PCR, Nasal     Status: None   Collection Time: 10/05/22  9:12 AM   Specimen: Nasal Mucosa; Nasal Swab  Result Value Ref Range Status   MRSA by PCR Next Gen NOT DETECTED NOT DETECTED Final    Comment: (NOTE) The GeneXpert MRSA Assay (FDA approved for NASAL specimens only), is one component of a comprehensive MRSA colonization surveillance program. It is not intended to diagnose MRSA infection nor to guide or monitor treatment for MRSA infections. Test performance is not FDA approved in patients less than 55 years old. Performed at [facility], [address] [place], [place] [address]     Coagulation Studies: No results for input(s): "LABPROT", "INR" in the last 72 hours.  Urinalysis: Recent Labs    10/01/2022 1641  COLORURINE BROWN*  LABSPEC 1.024  PHURINE TEST NOT REPORTED DUE TO COLOR INTERFERENCE OF URINE PIGMENT  GLUCOSEU TEST NOT REPORTED DUE TO COLOR INTERFERENCE OF URINE  PIGMENT*  HGBUR TEST NOT  REPORTED DUE TO COLOR INTERFERENCE OF URINE PIGMENT*  BILIRUBINUR TEST NOT REPORTED DUE TO COLOR INTERFERENCE OF URINE PIGMENT*  KETONESUR TEST NOT REPORTED DUE TO COLOR INTERFERENCE OF URINE PIGMENT*  PROTEINUR TEST NOT REPORTED DUE TO COLOR INTERFERENCE OF URINE PIGMENT*  NITRITE TEST NOT REPORTED DUE TO COLOR INTERFERENCE OF URINE PIGMENT*  LEUKOCYTESUR TEST NOT REPORTED DUE TO COLOR INTERFERENCE OF URINE PIGMENT*      Imaging: DG Chest Port 1 View  Result Date: 10/05/2022 CLINICAL DATA:  Evaluate central venous catheter placement EXAM: PORTABLE CHEST 1 VIEW COMPARISON:  10/27/2022 FINDINGS: Signs of previous median sternotomy. 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 There is a right IJ catheter with tip in the projection of the SVC. No pneumothorax identified. Cardiac enlargement is unchanged. Unchanged appearance of diffuse reticular interstitial opacities throughout both lungs. No sign of pleural effusion. IMPRESSION: Right IJ catheter with tip in the projection of the SVC. No pneumothorax. No change in diffuse increase interstitial opacities which may reflect interstitial edema, atypical infection or chronic lung disease. Electronically Signed   By: Kerby Moors M.D.   On: 10/05/2022 14:14   US Abdomen Limited RUQ (LIVER/GB)  Result Date: 10/05/2022 CLINICAL DATA:  Hyperbilirubinemia EXAM: ULTRASOUND ABDOMEN LIMITED RIGHT UPPER QUADRANT COMPARISON:  MRI and ultrasound October of 2023. FINDINGS: Gallbladder: No gallstones. No Murphy sign. Small amount of gallbladder sludge. Mild gallbladder wall thickening, 4.3 mm. The differential diagnosis is hydrops due to poor right heart function versus acalculous cholecystitis peer Common bile duct: Diameter: 4.2 mm.  No visible stone. Liver: No focal lesion. Increased echogenicity suggesting steatosis. Trace amount of ascites. Portal vein is patent on color Doppler imaging with to and fro flow directionality.  Other: None. IMPRESSION: 1. Small amount of gallbladder sludge. No gallstones. Mild gallbladder wall thickening. The differential diagnosis is hydrops due to poor right heart function versus acalculous cholecystitis. 2. Hepatic steatosis. Trace amount of ascites. 3. To and fro flow directionality in the portal vein. This constellation of findings is consistent with chronic hepatic parenchymal disease. Electronically Signed   By: Nelson Chimes M.D.   On: 10/05/2022 09:24   CT HEAD WO CONTRAST (5MM)  Result Date: 10/08/2022 CLINICAL DATA:  Mental status change EXAM: CT HEAD WITHOUT CONTRAST TECHNIQUE: Contiguous axial images were obtained from the base of the skull through the vertex without intravenous contrast. RADIATION DOSE REDUCTION: This exam was performed according to the departmental dose-optimization program which includes automated exposure control, adjustment of the mA and/or kV according to patient size and/or use of iterative reconstruction technique. COMPARISON:  MRI brain February 01, 2022 and head CT January 31, 2020 FINDINGS: Brain: Tiny remote left cerebellar infarct. No evidence of acute infarction, hemorrhage, hydrocephalus, extra-axial collection or mass lesion/mass effect. Vascular: No hyperdense vessel or unexpected calcification. Skull: Similar appearance of the left inferior orbital wall fracture with herniation of fat through the fracture defect. Negative for acute fracture or focal lesion. Sinuses/Orbits: Visualized portions of the paranasal sinuses predominantly clear. Other: None IMPRESSION: 1. No acute intracranial abnormality. 2. Tiny remote left cerebellar infarct. 3. Similar appearance of the left inferior orbital wall fracture with herniation of fat through the fracture defect. Electronically Signed   By: Dahlia Bailiff M.D.   On: 10/03/2022 19:14   DG Chest Port 1 View  Result Date: 10/27/2022 CLINICAL DATA:  Shortness of breath. EXAM: PORTABLE CHEST 1 VIEW COMPARISON:  09/25/2022  FINDINGS: Stable enlarged cardiac silhouette, median sternotomy wires, prosthetic mitral valve and left atrial  clip. Stable prominence of the interstitial markings with no Kerley lines. Possible small left pleural effusion. Thoracic spine degenerative changes. IMPRESSION: 1. Stable cardiomegaly and chronic interstitial lung disease with possible superimposed interstitial edema. 2. Possible small left pleural effusion. Electronically Signed   By: Claudie Revering M.D.   On: 10/09/2022 16:06     Assessment & Plan: Chelsea Ramsey is a 56 y.o.  female with past medical conditions including drug and alcohol abuse, hypertension, hyperlipidemia, depression and atrial fib, who was admitted to [facility] on 10/27/2022 for Generalized weakness [R53.1] AKI (acute kidney injury) ([place]) [N17.9] Severe sepsis with acute organ dysfunction ([place]) [A41.9, R65.20] Acute on chronic respiratory failure with hypoxia ([place]) [J96.21] Sepsis, due to unspecified organism, unspecified whether acute organ dysfunction present ([place]) [A41.9]   Acute kidney injury with hyperkalemia likely secondary to cardiorenal syndrome and possible UTI, urine culture pending. Normal renal function in July 2023. Creatinine on admission , 3.83. Potassium 5.2. Agree with current IVF. Continue supportive care and treatment of underlying cause.   2. Anemia, cause unknown at this time. Hgb 9.0. Will continue to monitor  3. Hypotension likely secondary to sepsis. Receiving Levo for BP support. Primary team to manage.    LOS: 1   11/6/20232:43 PM

## 2022-10-05 NOTE — Progress Notes (Signed)
Initial Nutrition Assessment  DOCUMENTATION CODES:   Obesity unspecified  INTERVENTION:   If tube feeds initiated, recommend:   Osmolite 1.5'@55ml'$ /hr- Initiate at 70m/hr and increase by 181mhr q 8 hours until goal rate is reached.   ProSource TF 20- Give 6071maily via tube, each supplement provides 80kcal and 20g of protein.   Free water flushes 80m61m hours to maintain tube patency   Regimen provides 2060kcal/day, 103g/day protein and 1186ml34m of free water.   Pt at high refeed risk; recommend monitor potassium, magnesium and phosphorus labs daily until stable  NUTRITION DIAGNOSIS:   Inadequate oral intake related to acute illness (AMS) as evidenced by NPO status.  GOAL:   Patient will meet greater than or equal to 90% of their needs  MONITOR:   Diet advancement, Labs, Weight trends, Skin, I & O's  REASON FOR ASSESSMENT:   Rounds    ASSESSMENT:   56 y/7female with h/o Afib, HTN, substance abuse, CHF, gastric ulcer, small bowel lymphangiectasias, fibromyalgia, DDD, OSA, CKD III, DM, depression, anxiety, COPD, GERD and chronic hepatic parenchymal disease who is admitted with AKI, sepsis, AMS and respiratory failure.  Visited pt's room today. Pt with AMS and is unable to provide any nutrition related history. Per RN, pt has been lethargic today and has been unable to take anything by mouth. MD and RN contemplating NGT placement for lactulose and nutrition support. Pt is likely at refeed risk. Per chart, pt is down 47lbs(17%) over the past 10 months; this is significant weight loss.   Medications reviewed and include: heparin, lactulose, protonix, xifaxan, thiamine, zosyn   Labs reviewed: K 5.2(H), BUN 53(H), creat 3.87(H), AST 73(H), ammonia 67(H), tbili 9.4(H) Wbc- 12.7(H), Hgb 9.0(L), Hct 32.5(L), MCH 24.6(L), MCHC 27.7(L) Cbgs- 96, 85 x 24 hrs AIC 4.5- 10/17  NUTRITION - FOCUSED PHYSICAL EXAM:  Flowsheet Row Most Recent Value  Orbital Region No depletion   Upper Arm Region No depletion  Thoracic and Lumbar Region No depletion  Buccal Region No depletion  Temple Region No depletion  Clavicle Bone Region No depletion  Clavicle and Acromion Bone Region No depletion  Scapular Bone Region No depletion  Dorsal Hand No depletion  Patellar Region No depletion  Anterior Thigh Region No depletion  Posterior Calf Region No depletion  Edema (RD Assessment) Mild  Hair Reviewed  Eyes Reviewed  Mouth Reviewed  Skin Reviewed  Nails Reviewed   Diet Order:   Diet Order             Diet NPO time specified Except for: Sips with Meds  Diet effective now                  EDUCATION NEEDS:   No education needs have been identified at this time  Skin:  Skin Assessment: Reviewed RN Assessment (ecchymosis)  Last BM:  11/2  Height:   Ht Readings from Last 1 Encounters:  09/15/22 '5\' 4"'$  (1.626 m)    Weight:   Wt Readings from Last 1 Encounters:  10/01/22 106.3 kg    Ideal Body Weight:  54.5 kg  Estimated Nutritional Needs:   Kcal:  2000-2300kcal/day  Protein:  100-115g/day  Fluid:  1.7-1.9L/day  CaseyKoleen DistanceRD, LDN Please refer to AMIONSanta [facility] Medical CenterRD and/or RD on-call/weekend/after hours pager

## 2022-10-05 NOTE — Procedures (Signed)
Central Venous Catheter Insertion Procedure Note  Chelsea Ramsey  [id number]  02-22-1966  Date:10/05/22  Time:1:29 PM   Provider Performing:Srihith Aquilino D Dewaine Conger   Procedure: Insertion of Non-tunneled Central Venous [phone]) with US guidance (31517)   Indication(s) Medication administration and Difficult access  Consent Risks of the procedure as well as the alternatives and risks of each were explained to the patient and/or caregiver.  Consent for the procedure was obtained and is signed in the bedside chart  Anesthesia Topical only with 1% lidocaine   Timeout Verified patient identification, verified procedure, site/side was marked, verified correct patient position, special equipment/implants available, medications/allergies/relevant history reviewed, required imaging and test results available.  Sterile Technique Maximal sterile technique including full sterile barrier drape, hand hygiene, sterile gown, sterile gloves, mask, hair covering, sterile ultrasound probe cover (if used).  Procedure Description Area of catheter insertion was cleaned with chlorhexidine and draped in sterile fashion.  With real-time ultrasound guidance a central venous catheter was placed into the right internal jugular vein. Nonpulsatile blood flow and easy flushing noted in all ports.  The catheter was sutured in place and sterile dressing applied.  Complications/Tolerance None; patient tolerated the procedure well. Chest X-ray is ordered to verify placement for internal jugular or subclavian cannulation.   Chest x-ray is not ordered for femoral cannulation.  EBL Minimal  Specimen(s) None   Line secured at the 16 cm mark. BIOPATCH applied to the insertion site.   Darel Hong, AGACNP-BC [facility] Prefer epic messenger for cross cover needs If after hours, please call E-link

## 2022-10-05 NOTE — ED Notes (Signed)
Provider, Neomia Glass, NP at bedside

## 2022-10-05 NOTE — Procedures (Signed)
Arterial Catheter Insertion Procedure Note  Hang Ammon  [id number]  01/19/1966  Date:10/05/22  Time:1:30 PM    Provider Performing: Bradly Bienenstock    Procedure: Insertion of Arterial Line [phone]) with US guidance ([id number])   Indication(s) Blood pressure monitoring and/or need for frequent ABGs  Consent Risks of the procedure as well as the alternatives and risks of each were explained to the patient and/or caregiver.  Consent for the procedure was obtained and is signed in the bedside chart  Anesthesia None   Time Out Verified patient identification, verified procedure, site/side was marked, verified correct patient position, special equipment/implants available, medications/allergies/relevant history reviewed, required imaging and test results available.   Sterile Technique Maximal sterile technique including full sterile barrier drape, hand hygiene, sterile gown, sterile gloves, mask, hair covering, sterile ultrasound probe cover (if used).   Procedure Description Area of catheter insertion was cleaned with chlorhexidine and draped in sterile fashion. With real-time ultrasound guidance an arterial catheter was placed into the right radial artery.  Appropriate arterial tracings confirmed on monitor.     Complications/Tolerance None; patient tolerated the procedure well.   EBL Minimal   Specimen(s) None   BIOPATCH applied to the insertion site.   Darel Hong, AGACNP-BC [facility] Prefer epic messenger for cross cover needs If after hours, please call E-link

## 2022-10-05 NOTE — Consult Note (Signed)
[place] NOTE       Patient ID: Chelsea Ramsey MRN: [id number] DOB/AGE: Apr 22, 1966 56 y.o.  Admit date: 10/22/2022 Referring Physician Darel Hong, NP  Primary Physician Dr. Gracy Racer Primary Cardiologist Dr. 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  She was admitted to stepdown and is being treated for septic vs cardiogenic shock with suspected urinary source, confounded by acute on chronic heart failure.  Cardiology is consulted for further assistance.  She is well-known to our service from her recent hospitalization from 10/17 - 11/2 with multiple issues, including initial chief complaint of abdominal pain x1 month with hyperbilirubinemia that was ultimately felt to be related to hepatic congestion from her right heart failure exacerbation and not related to a surgical, hematologic, or GI cause.  She also presented in acute renal failure with BUN/creatinine 47/3.54 and GFR 15 which normalized after days of IV diuresis.  For a few days during this admission she had some decreased mentation and slow responsiveness that improved with cessation of her opioid pain medicines, she was eventually able to tell me that she had stopped taking most of her prescription medications for several weeks prior to her hospitalization.  This admission was also complicated by atrial fibrillation with RVR, ultimately rate controlled with metoprolol, and her QuantiFERON gold was also positive on admission  although fortunately her sputum cultures were negative x3.  It was suspected that noncompliance to her medications ultimately led to her decompensation, she was diuresed adequately and she was back to her baseline oxygen requirement by discharge although she remained significantly weak.  A SNF was recommended by PT/OT but the patient and her family declined, opting to take her home with home health instead.  Per chart review, she was basically bedbound for the 3 days she was at home prior to representation.  Per chart review, following discharge on 11/2 she was unable to take her medications as prescribed or ambulate, and she fell out of her wheelchair but denied hitting her head.  At my time of evaluation she is sitting upright in bed in the ICU, without family at bedside.  She is rather lethargic nodding off during interview.  She is oriented to self and to [facility], but frequently repeats what I am saying rather than answering questions that I asked.  When asked if anything is bothering her she simply says "itching."   In the ED she was given a total of 3 L of IV fluids in addition to empiric vancomycin that is now been switched to IV Zosyn.  She was started on Levophed and admitted to stepdown.  Vitals are notable for significant hypotension with recent blood pressure 75/61, her heart rate is in the 80s to 90s in atrial fibrillation on telemetry.  She was given 25 mcg of fentanyl, followed by another 12.5 mcg of fentanyl an hour prior to my evaluation.   Labs on admission are notable for hyperkalemia with potassium 5.5, BUN/creatinine 50/3.3 and GFR of 13, mildly elevated AST at 70, hyperbilirubinemia with T.  bili 9.0 uptrending to 9.4 today with almost equally elevated direct and indirect bili at 4.7/4.3 respectively.  BNP was elevated at 795.  High-sensitivity troponin minimally elevated with a flat trend at 51/49.  Lactic acidosis with initial lactate 2.1, procalcitonin elevated at 0.69.  Leukocytosis with  WBCs uptrending 12.0-12.7, H&H around baseline at 9.0/32.5, platelets 2 9.  TSH is elevated at 8.9 with T4 within normal limits at 0.98.  Urinalysis with significant RBCs, WBCs in her urine was brown in color unable to detect leukocytes, nitrites because of this.  Her chest x-ray showed stable cardiomegaly and chronic ILD with superimposed interstitial edema and possible small left pleural effusion.  Review of systems complete and found to be negative unless listed above     Past Medical History:  Diagnosis Date   Acute drug-induced gout of left foot 03/01/2018   Last Assessment & Plan:  Likely at least partially brought on by diuresis.  Needs to continue to diurese  Will push hydration Stop allopurinol given initiation during acute flare may worsen this, re-broach this when asymptomatic Avoid nsaids given stomach pain Trial colchicine Add acetaminophen Ice, elevate, rest   Allergy    seasonal   Anxiety    Arthritis    Right Knee   Asthma    CHF (congestive heart failure) (HCC)    COPD (chronic obstructive pulmonary disease) (HCC)    Coronary artery disease    Leaky heart valve   Diabetes mellitus without complication (HCC)    Dysrhythmia    Fibromyalgia    GERD (gastroesophageal reflux disease)    Hypertension    Pneumonia    PUD (peptic ulcer disease)    Pulmonary HTN (HCC)    Rheumatic fever/heart disease    Sleep apnea     Past Surgical History:  Procedure Laterality Date   ADENOIDECTOMY     COLONOSCOPY WITH PROPOFOL N/A 11/06/2019   Procedure: COLONOSCOPY WITH PROPOFOL;  Surgeon: Virgel Manifold, MD;  Location: [facility] ENDOSCOPY;  Service: Endoscopy;  Laterality: N/A;  2 day prep    COLONOSCOPY WITH PROPOFOL N/A 06/29/2022   Procedure: COLONOSCOPY WITH PROPOFOL;  Surgeon: Lin Landsman, MD;  Location: [facility] ENDOSCOPY;  Service: Gastroenterology;  Laterality: N/A;   CORONARY ARTERY BYPASS GRAFT     June 27 2018   ESOPHAGOGASTRODUODENOSCOPY (EGD) WITH PROPOFOL N/A  05/25/2018   Procedure: ESOPHAGOGASTRODUODENOSCOPY (EGD) WITH PROPOFOL;  Surgeon: Lucilla Lame, MD;  Location: [facility] ENDOSCOPY;  Service: Endoscopy;  Laterality: N/A;   ESOPHAGOGASTRODUODENOSCOPY (EGD) WITH PROPOFOL N/A 11/06/2019   Procedure: ESOPHAGOGASTRODUODENOSCOPY (EGD) WITH PROPOFOL;  Surgeon: Virgel Manifold, MD;  Location: [facility] ENDOSCOPY;  Service: Endoscopy;  Laterality: N/A;   ESOPHAGOGASTRODUODENOSCOPY (EGD) WITH PROPOFOL N/A 06/29/2022   Procedure: ESOPHAGOGASTRODUODENOSCOPY (EGD) WITH PROPOFOL;  Surgeon: Lin Landsman, MD;  Location: [facility] ENDOSCOPY;  Service: Gastroenterology;  Laterality: N/A;   GIVENS CAPSULE STUDY N/A 07/22/2022   Procedure: GIVENS CAPSULE STUDY;  Surgeon: Lin Landsman, MD;  Location: [facility] ENDOSCOPY;  Service: Gastroenterology;  Laterality: N/A;   MITRAL VALVE REPLACEMENT     MULTIPLE TOOTH EXTRACTIONS     TONSILLECTOMY     TOTAL KNEE ARTHROPLASTY Right 09/04/2016   Procedure: TOTAL KNEE ARTHROPLASTY; with lateral release;  Surgeon: Earlie Server, MD;  Location: [place];  Service: Orthopedics;  Laterality: Right;    Medications Prior to Admission  Medication Sig Dispense Refill Last Dose   albuterol (VENTOLIN HFA) 108 (90 Base) MCG/ACT inhaler Inhale 1-2 puffs into the lungs every 6 (six) hours  as needed for wheezing or shortness of breath.   prn   allopurinol (ZYLOPRIM) 100 MG tablet Take 1 tablet (100 mg total) by mouth 2 (two) times daily. 60 tablet 5 10/02/2022   Cholecalciferol (VITAMIN D) 50 MCG (2000 UT) CAPS Take 1 capsule (2,000 Units total) by mouth daily. 30 capsule 5 10/02/2022   colchicine 0.6 MG tablet Take 1 tablet (0.6 mg total) by mouth daily. As directed 30 tablet 5 prn   diazepam (VALIUM) 10 MG tablet Take 10 mg by mouth 2 (two) times daily as needed for anxiety.   prn   docusate sodium (COLACE) 100 MG capsule Take 100 mg by mouth 2 (two) times daily as needed for mild constipation or moderate constipation.   prn   empagliflozin  (JARDIANCE) 10 MG TABS tablet Take 10 mg by mouth daily.   10/02/2022   furosemide (LASIX) 20 MG tablet Take 3 tablets (60 mg total) by mouth daily. 90 tablet 0 10/02/2022   gabapentin (NEURONTIN) 600 MG tablet Take 0.5 tablets (300 mg total) by mouth 2 (two) times daily. (Patient taking differently: Take 600 mg by mouth 3 (three) times daily.) 60 tablet 0 prn   ipratropium-albuterol (DUONEB) 0.5-2.5 (3) MG/3ML SOLN INHALE THE CONTENTS OF 1 VIAL(3MLS) VIA NEBULIZER 3 TIMES DAILY AS NEEDED 360 mL 1 prn   Iron-FA-B Cmp-C-Biot-Probiotic (FUSION PLUS) CAPS Take 1 capsule by mouth daily. (Patient not taking: Reported on 09/16/2022) 30 capsule 3    lactulose (CHRONULAC) 10 GM/15ML solution Take 45 mLs (30 g total) by mouth 2 (two) times daily. 2700 mL 0 10/02/2022   metolazone (ZAROXOLYN) 2.5 MG tablet Take 1 tablet (2.5 mg total) by mouth every Monday, Wednesday, and Friday. 12 tablet 0 10/02/2022   metoprolol tartrate (LOPRESSOR) 50 MG tablet Take 1 tablet (50 mg total) by mouth 2 (two) times daily. 60 tablet 0 10/02/2022   nitroGLYCERIN (NITROSTAT) 0.4 MG SL tablet Place 1 tablet (0.4 mg total) under the tongue every 5 (five) minutes as needed for chest pain. 30 tablet 12 prn   omeprazole (PRILOSEC) 40 MG capsule Take 40 mg by mouth daily.   10/02/2022   oxyCODONE ER 9 MG C12A Take 9 mg by mouth 2 (two) times daily.   10/02/2022   potassium chloride (KLOR-CON) 10 MEQ tablet Take 4 tablets (40 mEq total) by mouth 2 (two) times daily. And additional 32mq on metolazone days (Patient taking differently: Take 40 mEq by mouth See admin instructions. Take 4 tablets (430m) by mouth twice daily - take an additional 2 tablets (2075m by mouth when taking metolazone) 64 tablet 5 10/02/2022   promethazine (PHENERGAN) 12.5 MG tablet Take 1 tablet (12.5 mg total) by mouth every 8 (eight) hours as needed for nausea or vomiting. (Patient taking differently: Take 12.5 mg by mouth 2 (two) times daily as needed for nausea or  vomiting.) 30 tablet 0 prn   rifaximin (XIFAXAN) 550 MG TABS tablet Take 1 tablet (550 mg total) by mouth 2 (two) times daily. 60 tablet 0 10/02/2022   rivaroxaban (XARELTO) 20 MG TABS tablet Take 20 mg by mouth daily with supper.   10/02/2022   rosuvastatin (CRESTOR) 10 MG tablet Take 10 mg by mouth daily.   10/02/2022   spironolactone (ALDACTONE) 25 MG tablet Take 0.5 tablets (12.5 mg total) by mouth daily. 15 tablet 0 10/02/2022   Social History   Socioeconomic History   Marital status: Married    Spouse name: Not on file   Number of children:  Not on file   Years of education: Not on file   Highest education level: Not on file  Occupational History   Occupation: disabled  Tobacco Use   Smoking status: Every Day    Packs/day: 0.10    Types: Cigarettes   Smokeless tobacco: Never   Tobacco comments:    4/20 was not able to quit.  Still at 1-3 a day. She would like to wait to set a new quit date until we get back to class to have the extra in person support  Vaping Use   Vaping Use: Never used  Substance and Sexual Activity   Alcohol use: Never    Alcohol/week: 3.0 standard drinks of alcohol    Types: 3 Cans of beer per week    Comment: 16 oz per week   Drug use: Not Currently    Comment: Previous use of cocaine and marijuana last use 07/31/16    Sexual activity: Not Currently  Other Topics Concern   Not on file  Social History Narrative   Not on file   Social Determinants of Health   Financial Resource Strain: Low Risk  (10/02/2019)   Overall Financial Resource Strain (CARDIA)    Difficulty of Paying Living Expenses: Not hard at all  Food Insecurity: No Food Insecurity (09/18/2022)   Hunger Vital Sign    Worried About Running Out of Food in the Last Year: Never true    Ran Out of Food in the Last Year: Never true  Transportation Needs: No Transportation Needs (09/18/2022)   PRAPARE - Hydrologist (Medical): No    Lack of Transportation  (Non-Medical): No  Physical Activity: Insufficiently Active (10/02/2019)   Exercise Vital Sign    Days of Exercise per Week: 7 days    Minutes of Exercise per Session: 20 min  Stress: No Stress Concern Present (10/02/2019)   [place]    Feeling of Stress : Not at all  Social Connections: Moderately Integrated (10/02/2019)   Social Connection and Isolation Panel [NHANES]    Frequency of Communication with Friends and Family: More than three times a week    Frequency of Social Gatherings with Friends and Family: More than three times a week    Attends Religious Services: 1 to 4 times per year    Active Member of Clubs or Organizations: Yes    Attends Archivist Meetings: 1 to 4 times per year    Marital Status: Never married  Intimate Partner Violence: Not At Risk (09/18/2022)   Humiliation, Afraid, Rape, and Kick questionnaire    Fear of Current or Ex-Partner: No    Emotionally Abused: No    Physically Abused: No    Sexually Abused: No    Family History  Problem Relation Age of Onset   COPD Mother    Cancer Mother        Bone   Asthma Mother    Rheum arthritis Mother    Congestive Heart Failure Father    Breast cancer Maternal Aunt      Vitals:   10/05/22 1100 10/05/22 1115 10/05/22 1130 10/05/22 1200  BP: (!) 90/55 93/63 (!) 80/58 (!) 74/53  Pulse: 88 99 64 94  Resp: (!) '23 17 16 18  '$ Temp:      TempSrc:      SpO2: 96% 96% (!) 61% 97%    PHYSICAL EXAM General: Acute on chronically ill-appearing black female, in  no acute distress.  Sitting upright in ICU bed, no family at bedside. HEENT:  Normocephalic and atraumatic. Neck:  + JVD.  Lungs: Normal respiratory effort on 3 L by nasal cannula.  Decreased breath sounds bilaterally with coarseness anteriorly, noncooperative with exam Heart: Tachycardic irregularly irregular rhythm. Normal S1 and S2 without gallops.  2/6 systolic murmur best heard at  the RUSB.  Abdomen: Non-distended appearing with excess adiposity and a large pannus.  Msk: Generalized weakness Extremities: Warm and well perfused. No clubbing, cyanosis.  2+ bilateral lower extremity edema, feet cool to touch.  Neuro: Lethargic, nods off during interview Psych: Difficult to assess, often repeats statements rather than answers questions that are asked  Labs: Basic Metabolic Panel: Recent Labs    10/11/2022 1602 10/05/22 0306  NA 137 137  K 5.5* 5.2*  CL 90* 93*  CO2 34* 32  GLUCOSE 64* 93  BUN 50* 53*  CREATININE 3.83* 3.87*  CALCIUM 9.3 9.0  MG 2.2  --    Liver Function Tests: Recent Labs    10/01/2022 1602 10/05/22 0306  AST 70* 73*  ALT 36 40  ALKPHOS 93 91  BILITOT 9.0* 9.4*  PROT 6.9 7.1  ALBUMIN 2.6* 2.8*   No results for input(s): "LIPASE", "AMYLASE" in the last 72 hours. CBC: Recent Labs    10/19/2022 1602 10/05/22 0306  WBC 12.0* 12.7*  NEUTROABS 9.4*  --   HGB 8.6* 9.0*  HCT 30.7* 32.5*  MCV 88.5 88.8  PLT 258 290   Cardiac Enzymes: Recent Labs    10/09/2022 1602 10/11/2022 1707  TROPONINIHS 51* 49*   BNP: Recent Labs    10/08/2022 1602  BNP 785.2*   D-Dimer: No results for input(s): "DDIMER" in the last 72 hours. Hemoglobin A1C: No results for input(s): "HGBA1C" in the last 72 hours. Fasting Lipid Panel: No results for input(s): "CHOL", "HDL", "LDLCALC", "TRIG", "CHOLHDL", "LDLDIRECT" in the last 72 hours. Thyroid Function Tests: Recent Labs    10/05/22 0306  TSH 8.916*   Anemia Panel: Recent Labs    10/05/22 0306  VITAMINB12 1,380*     Radiology: US Abdomen Limited RUQ (LIVER/GB)  Result Date: 10/05/2022 CLINICAL DATA:  Hyperbilirubinemia EXAM: ULTRASOUND ABDOMEN LIMITED RIGHT UPPER QUADRANT COMPARISON:  MRI and ultrasound October of 2023. FINDINGS: Gallbladder: No gallstones. No Murphy sign. Small amount of gallbladder sludge. Mild gallbladder wall thickening, 4.3 mm. The differential diagnosis is hydrops due to  poor right heart function versus acalculous cholecystitis peer Common bile duct: Diameter: 4.2 mm.  No visible stone. Liver: No focal lesion. Increased echogenicity suggesting steatosis. Trace amount of ascites. Portal vein is patent on color Doppler imaging with to and fro flow directionality. Other: None. IMPRESSION: 1. Small amount of gallbladder sludge. No gallstones. Mild gallbladder wall thickening. The differential diagnosis is hydrops due to poor right heart function versus acalculous cholecystitis. 2. Hepatic steatosis. Trace amount of ascites. 3. To and fro flow directionality in the portal vein. This constellation of findings is consistent with chronic hepatic parenchymal disease. Electronically Signed   By: Nelson Chimes M.D.   On: 10/05/2022 09:24   CT HEAD WO CONTRAST (5MM)  Result Date: 10/03/2022 CLINICAL DATA:  Mental status change EXAM: CT HEAD WITHOUT CONTRAST TECHNIQUE: Contiguous axial images were obtained from the base of the skull through the vertex without intravenous contrast. RADIATION DOSE REDUCTION: This exam was performed according to the departmental dose-optimization program which includes automated exposure control, adjustment of the mA and/or kV according to patient size  and/or use of iterative reconstruction technique. COMPARISON:  MRI brain February 01, 2022 and head CT January 31, 2020 FINDINGS: Brain: Tiny remote left cerebellar infarct. No evidence of acute infarction, hemorrhage, hydrocephalus, extra-axial collection or mass lesion/mass effect. Vascular: No hyperdense vessel or unexpected calcification. Skull: Similar appearance of the left inferior orbital wall fracture with herniation of fat through the fracture defect. Negative for acute fracture or focal lesion. Sinuses/Orbits: Visualized portions of the paranasal sinuses predominantly clear. Other: None IMPRESSION: 1. No acute intracranial abnormality. 2. Tiny remote left cerebellar infarct. 3. Similar appearance of the left  inferior orbital wall fracture with herniation of fat through the fracture defect. Electronically Signed   By: Dahlia Bailiff M.D.   On: 10/12/2022 19:14   DG Chest Port 1 View  Result Date: 10/20/2022 CLINICAL DATA:  Shortness of breath. EXAM: PORTABLE CHEST 1 VIEW COMPARISON:  09/25/2022 FINDINGS: Stable enlarged cardiac silhouette, median sternotomy wires, prosthetic mitral valve and left atrial clip. Stable prominence of the interstitial markings with no Kerley lines. Possible small left pleural effusion. Thoracic spine degenerative changes. IMPRESSION: 1. Stable cardiomegaly and chronic interstitial lung disease with possible superimposed interstitial edema. 2. Possible small left pleural effusion. Electronically Signed   By: Claudie Revering M.D.   On: 10/21/2022 16:06   DG Chest Port 1 View  Result Date: 09/25/2022 CLINICAL DATA:  Shortness of breath, renal failure. EXAM: PORTABLE CHEST 1 VIEW COMPARISON:  Chest radiograph dated 09/16/2022. FINDINGS: The heart is enlarged. Mild-to-moderate bilateral interstitial and airspace opacities are redemonstrated. There is no pleural effusion or pneumothorax. Degenerative changes are seen in the spine. A mitral valve prosthesis and atrial appendage clip, and median sternotomy wires are redemonstrated. IMPRESSION: Cardiomegaly with mild-to-moderate bilateral interstitial and airspace opacities, likely pulmonary edema. Electronically Signed   By: Zerita Boers M.D.   On: 09/25/2022 10:23   ECHOCARDIOGRAM LIMITED  Result Date: 09/23/2022    ECHOCARDIOGRAM LIMITED REPORT   Patient Name:   Chelsea Ramsey Date of Exam: 09/23/2022 Medical Rec #:  [id number]                 Height:       64.0 in Accession #:    [id number]                Weight:       243.4 lb Date of Birth:  [birth year]                 BSA:          2.127 m Patient Age:    5 years                  BP:           109/71 mmHg Patient Gender: F                         HR:           96 bpm. Exam  Location:  [facility] Procedure: Limited Echo Indications:     CHF I50.9  History:         Patient has prior history of Echocardiogram examinations, most                  recent 09/17/2022. CHF, Prior Cardiac Surgery, COPD; Risk                  Factors:Hypertension.  Mitral Valve: bioprosthetic valve valve is present in the                  mitral position.  Sonographer:     Sherrie Sport Referring Phys:  [id number] [place] Alison Breeding Diagnosing Phys: Serafina Royals MD  Sonographer Comments: Technically difficult study due to poor echo windows, suboptimal apical window and suboptimal parasternal window. IMPRESSIONS  1. Left ventricular ejection fraction, by estimation, is 35 to 40%. The left ventricle has moderately decreased function. The left ventricle demonstrates global hypokinesis. The left ventricular internal cavity size was mildly dilated.  2. There is LV septal flattenting in systole and diastole and dilated hepatic vein consistant with severe phtn. Right ventricular systolic function is moderately reduced. The right ventricular size is moderately enlarged. Mildly increased right ventricular wall thickness. There is severely elevated pulmonary artery systolic pressure.  3. Left atrial size was severely dilated.  4. Right atrial size was severely dilated.  5. Cannot rule out mild stenosis. doppler difficult to interpret. The mitral valve is degenerative. Mild mitral valve regurgitation. 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  6. The tricuspid valve is abnormal. Tricuspid valve regurgitation is severe.  7. The aortic valve is calcified. Aortic valve regurgitation is moderate. FINDINGS  Left Ventricle: Left ventricular ejection fraction, by estimation, is 35 to 40%. The left ventricle has moderately decreased function. The left ventricle demonstrates global hypokinesis. The left ventricular internal cavity size was mildly dilated. Right Ventricle: There is LV septal flattenting in  systole and diastole and dilated hepatic vein consistant with severe phtn. The right ventricular size is moderately enlarged. Mildly increased right ventricular wall thickness. Right ventricular systolic  function is moderately reduced. There is severely elevated pulmonary artery systolic pressure. Left Atrium: Left atrial size was severely dilated. Right Atrium: Right atrial size was severely dilated. Pericardium: There is no evidence of pericardial effusion. Mitral Valve: Cannot rule out mild stenosis. doppler difficult to interpret. The mitral valve is degenerative in appearance. Mild mitral valve regurgitation. 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 MV peak gradient, 19.2 mmHg. The mean mitral valve gradient is 11.0 mmHg. Tricuspid Valve: The tricuspid valve is abnormal. Tricuspid valve regurgitation is severe. Aortic Valve: The aortic valve is calcified. Aortic valve regurgitation is moderate. Pulmonic Valve: The pulmonic valve was normal in structure. Pulmonic valve regurgitation is mild. Aorta: The aortic root and ascending aorta are structurally normal, with no evidence of dilitation. IAS/Shunts: No atrial level shunt detected by color flow Doppler. LEFT VENTRICLE PLAX 2D LVIDd:         4.70 cm LVIDs:         3.10 cm LV PW:         0.90 cm LV IVS:        0.90 cm  RIGHT VENTRICLE RV Basal diam:  5.00 cm RV Mid diam:    6.50 cm LEFT ATRIUM         Index LA diam:    6.50 cm 3.06 cm/m   AORTA Ao Root diam: 2.90 cm MITRAL VALVE                TRICUSPID VALVE MV Area (PHT): 2.63 cm     TR Peak grad:   53.0 mmHg MV Peak grad:  19.2 mmHg    TR Vmax:        364.00 cm/s MV Mean grad:  11.0 mmHg MV Vmax:       2.19 m/s  MV Vmean:      161.0 cm/s MV Decel Time: 288 msec MV E velocity: 239.00 cm/s Serafina Royals MD Electronically signed by Serafina Royals MD Signature Date/Time: 09/23/2022/4:02:55 PM    Final    ECHOCARDIOGRAM COMPLETE  Result Date: 09/17/2022    ECHOCARDIOGRAM REPORT    Patient Name:   Chelsea Ramsey Date of Exam: 09/17/2022 Medical Rec #:  [id number]                 Height:       64.0 in Accession #:    [id number]                Weight:       265.0 lb Date of Birth:  1966/01/26                 BSA:          2.205 m Patient Age:    66 years                  BP:           93/79 mmHg Patient Gender: F                         HR:           110 bpm. Exam Location:  [facility] Procedure: 2D Echo, Color Doppler and Cardiac Doppler Indications:     I50.31 CHF-Acute Diastolic  History:         Patient has prior history of Echocardiogram examinations, most                  recent 11/05/2018. CHF, CAD, COPD; Risk Factors:Hypertension,                  Diabetes and Sleep Apnea.  Sonographer:     Charmayne Sheer Referring Phys:  [id number] [place] Romir Klimowicz Diagnosing Phys: Yolonda Kida MD  Sonographer Comments: Suboptimal apical window and no subcostal window. Image acquisition challenging due to patient body habitus and Image acquisition challenging due to COPD. IMPRESSIONS  1. Left ventricular ejection fraction, by estimation, is 20 to 25%. The left ventricle has severely decreased function. The left ventricle demonstrates global hypokinesis. Left ventricular diastolic parameters are consistent with Grade III diastolic dysfunction (restrictive). There is the interventricular septum is flattened in systole and diastole, consistent with right ventricular pressure and volume overload.  2. Right ventricular systolic function is moderately reduced. The right ventricular size is moderately enlarged. Mildly increased right ventricular wall thickness.  3. Left atrial size was moderately dilated.  4. Right atrial size was severely dilated.  5. The mitral valve is abnormal. Mild to moderate mitral valve regurgitation.  6. Tricuspid valve regurgitation is severe.  7. The aortic valve is calcified. Aortic valve regurgitation is mild to moderate. Aortic valve sclerosis/calcification is present, without  any evidence of aortic stenosis. Conclusion(s)/Recommendation(s): Poor windows for evaluation of left ventricular function by transthoracic echocardiography. Would recommend an alternative means of evaluation. FINDINGS  Left Ventricle: Left ventricular ejection fraction, by estimation, is 20 to 25%. The left ventricle has severely decreased function. The left ventricle demonstrates global hypokinesis. The left ventricular internal cavity size was normal in size. There is borderline concentric left ventricular hypertrophy. The interventricular septum is flattened in systole and diastole, consistent with right ventricular pressure and volume overload. Left ventricular diastolic parameters are consistent with Grade III diastolic dysfunction (restrictive). Right Ventricle: The right  ventricular size is moderately enlarged. Mildly increased right ventricular wall thickness. Right ventricular systolic function is moderately reduced. Left Atrium: Left atrial size was moderately dilated. Right Atrium: Right atrial size was severely dilated. Pericardium: There is no evidence of pericardial effusion. Mitral Valve: The mitral valve is abnormal. Mild to moderate mitral valve regurgitation. Tricuspid Valve: The tricuspid valve is grossly normal. Tricuspid valve regurgitation is severe. Aortic Valve: The aortic valve is calcified. Aortic valve regurgitation is mild to moderate. Aortic regurgitation PHT measures 233 msec. Aortic valve sclerosis/calcification is present, without any evidence of aortic stenosis. Aortic valve mean gradient measures 6.0 mmHg. Aortic valve peak gradient measures 11.3 mmHg. Aortic valve area, by VTI measures 1.14 cm. Pulmonic Valve: The pulmonic valve was normal in structure. Pulmonic valve regurgitation is mild to moderate. Aorta: The ascending aorta was not well visualized. IAS/Shunts: No atrial level shunt detected by color flow Doppler.  LEFT VENTRICLE PLAX 2D LVIDd:         3.50 cm LVIDs:          3.20 cm LV PW:         1.20 cm LV IVS:        0.90 cm LVOT diam:     1.80 cm LV SV:         25 LV SV Index:   12 LVOT Area:     2.54 cm  RIGHT VENTRICLE RV Basal diam:  5.30 cm RV Mid diam:    5.00 cm RV S prime:     7.62 cm/s LEFT ATRIUM         Index       RIGHT ATRIUM           Index LA diam:    5.50 cm 2.49 cm/m  RA Area:     28.50 cm                                 RA Volume:   104.00 ml 47.17 ml/m  AORTIC VALVE                     PULMONIC VALVE AV Area (Vmax):    0.87 cm      PV Vmax:          0.98 m/s AV Area (Vmean):   0.90 cm      PV Vmean:         69.200 cm/s AV Area (VTI):     1.14 cm      PV VTI:           0.128 m AV Vmax:           168.00 cm/s   PV Peak grad:     3.9 mmHg AV Vmean:          116.000 cm/s  PV Mean grad:     2.5 mmHg AV VTI:            0.223 m       PR End Diast Vel: 10.24 msec AV Peak Grad:      11.3 mmHg AV Mean Grad:      6.0 mmHg LVOT Vmax:         57.60 cm/s LVOT Vmean:        40.900 cm/s LVOT VTI:          0.100 m LVOT/AV VTI ratio: 0.45 AI PHT:  233 msec  AORTA Ao Root diam: 3.10 cm MITRAL VALVE               TRICUSPID VALVE MV Area (PHT): 5.95 cm    TR Peak grad:   55.7 mmHg MV Decel Time: 128 msec    TR Vmax:        373.00 cm/s MV E velocity: 87.60 cm/s                            SHUNTS                            Systemic VTI:  0.10 m                            Systemic Diam: 1.80 cm Yolonda Kida MD Electronically signed by Yolonda Kida MD Signature Date/Time: 09/17/2022/4:54:34 PM    Final    DG Chest 1 View  Result Date: 09/16/2022 CLINICAL DATA:  Worsening shortness of breath. 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 Cardiomegaly which has increased from 2021. Slight globular configuration of the heart, query pericardial effusion. Suspect small right pleural effusion. Chronic bilateral lung opacities that appears  stable. No acute airspace disease or pneumothorax. On limited assessment, no acute osseous findings. IMPRESSION: 1. Increased cardiomegaly from 2021. Slight globular configuration of the heart, query pericardial effusion. 2. Suspect small right pleural effusion. 3. Stable chronic bilateral lung opacities. Electronically Signed   By: Keith Rake M.D.   On: 09/16/2022 17:21   MR ABDOMEN MRCP WO CONTRAST  Result Date: 09/16/2022 CLINICAL DATA:  56 year old female with history of jaundice. Nausea. EXAM: MRI ABDOMEN WITHOUT CONTRAST  (INCLUDING MRCP) TECHNIQUE: Multiplanar multisequence MR imaging of the abdomen was performed. Heavily T2-weighted images of the biliary and pancreatic ducts were obtained, and three-dimensional MRCP images were rendered by post processing. COMPARISON:  No prior abdominal MRI or MRCP. Abdominal ultrasound 09/15/2022. FINDINGS: Comment: Portions of today's examination are substantially limited by patient respiratory motion. The study is also severely limited for detection and characterization of visceral and/or vascular lesions by lack of IV gadolinium. Lower chest: Severe cardiomegaly. Massively distended inferior vena cava. Hepatobiliary: No definite suspicious cystic or solid hepatic lesions are confidently identified on today's motion limited noncontrast examination. No intra or extrahepatic biliary ductal dilatation noted on MRCP images. Common bile duct measures only 3 mm in the porta hepatis. No filling defect within the common bile duct to suggest choledocholithiasis. T1 hyperintense, T2 hypointense material lies dependently within the gallbladder. Gallbladder does not appear overly distended. Accurate assessment for gallbladder wall thickening is limited on today's examination secondary to motion, but there may be some very mild gallbladder wall thickening and edema. No overt pericholecystic fluid or surrounding inflammatory changes. Pancreas: No definite pancreatic mass or  peripancreatic fluid collections or inflammatory changes noted on today's noncontrast examination. No pancreatic ductal dilatation. Spleen:  Unremarkable. Adrenals/Urinary Tract: Unenhanced appearance of the kidneys and bilateral adrenal glands is normal. No hydroureteronephrosis in the visualized portions of the abdomen. Stomach/Bowel: Visualized portions are unremarkable. Vascular/Lymphatic: No aneurysm identified in the visualized abdominal vasculature. No lymphadenopathy noted in the abdomen. Other: No significant volume of ascites noted in the visualized portions of the peritoneal cavity. Musculoskeletal: No aggressive appearing  osseous lesions are noted in the visualized portions of the skeleton. IMPRESSION: 1. Biliary sludge lying dependently in the gallbladder. Gallbladder wall may be mildly edematous, but is poorly evaluated on today's motion limited examination. If there is any clinical concern for acute cholecystitis, further evaluation with right upper quadrant abdominal ultrasound or nuclear medicine hepatobiliary scan should be considered at this time. 2. No biliary tract dilatation to suggest obstruction. No evidence of choledocholithiasis. 3. Severe cardiomegaly. Electronically Signed   By: Vinnie Langton M.D.   On: 09/16/2022 05:28   US ABDOMEN LIMITED RUQ (LIVER/GB)  Result Date: 09/15/2022 CLINICAL DATA:  Elevated bilirubin EXAM: ULTRASOUND ABDOMEN LIMITED RIGHT UPPER QUADRANT COMPARISON:  CT abdomen and pelvis 12/01/2018 FINDINGS: Gallbladder: Sludge is present within the gallbladder. Small amount of pericholecystic fluid. The gallbladder wall is mildly thickened measuring 5 mm. Sonographic Murphy's sign was positive. Common bile duct: Diameter: 3 mm Liver: No focal lesion identified. Increased echogenicity. Portal vein is patent on Doppler imaging with bidirectional flow. Other: None. IMPRESSION: 1. Findings compatible with acute cholecystitis. 2. Hepatic steatosis with bidirectional flow  in the portal vein suggesting portal venous hypertension. Electronically Signed   By: Placido Sou M.D.   On: 09/15/2022 23:07    ECHO limited 09/23/22  1. Left ventricular ejection fraction, by estimation, is 35 to 40%. The left ventricle has moderately decreased function. The left ventricle demonstrates global hypokinesis. The left ventricular internal cavity size was mildly dilated.  2. There is LV septal flattenting in systole and diastole and dilated hepatic vein consistant with severe phtn. Right ventricular systolic function is moderately reduced. The right ventricular size is moderately enlarged. Mildly increased right ventricular wall thickness. There is severely elevated pulmonary artery systolic pressure.  3. Left atrial size was severely dilated.  4. Right atrial size was severely dilated.  5. Cannot rule out mild stenosis. doppler difficult to interpret. The mitral valve is degenerative. Mild mitral valve regurgitation. 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  6. The tricuspid valve is abnormal. Tricuspid valve regurgitation is severe.  7. The aortic valve is calcified. Aortic valve regurgitation is moderate.    TELEMETRY reviewed by me (LT) 10/05/2022 : Atrial fibrillation rate 90s to low 100s  EKG reviewed by me: Atrial fibrillation rate 110  Data reviewed by me (LT) 10/05/2022: ED note, admission H&P, PCCM consult note, nursing notes, CBC, BMP, vitals, telemetry, troponins, BNP, I's and O's  Principal Problem:   Severe sepsis with acute organ dysfunction ([place]) Active Problems:   A-fib ([place])   HTN (hypertension)   Chronic anticoagulation ([facility])   GAD (generalized anxiety disorder)   History of substance abuse ([place])   Insomnia   Acute renal failure ([place])   Altered mental status   Hypotension   Hepatic steatosis    ASSESSMENT AND PLAN:  Chelsea Ramsey "Chelsea Ramsey" is a 55yoF with a PMH of HFrEF (EF 35-40% G3 DD, global hypo), 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  She was admitted to stepdown and is being treated for septic vs cardiogenic shock with suspected urinary source, confounded by acute on chronic heart failure.  Cardiology is consulted for further assistance.  #Acute encephalopathy #  Septic shock, suspected urinary source UA on admission significant for WBCs, RBCs and was brown in color, limiting evaluation of nitrites and leukocytes.  Culture pending. -Antibiotics narrowed from vancomycin to Zosyn, management per primary team -Recommend cautious use of IV fluids, anticipate need for diuresis -Wean vasopressor support as tolerated -Favor minimizing use of opioids and other sedating medicines if at all possible  #Acute on chronic hypoxic respiratory failure #Pulmonary hypertension Remains on her baseline 3 L by nasal cannula.   #Acute on chronic HFrEF (EF 35-40%, G3 DD, global hypokinesis) #Rheumatic MS s/p porcine MVR 2019 BNP elevated at 785 on admission, notably less than it was on 10/25 (1800).  Appears clinically volume overloaded on exam with peripheral edema, JVD, and interstitial edema on chest x-ray.  Net +5 L.  -Anticipate need for diuresis. Renal function has not improved with IV fluids. -Discussed with nephrology who agrees with holding diuresis today while she is requiring so much Levophed.  Will consider Lasix tomorrow pending recheck of vitals, BMP -Reinstate GDMT as her blood pressure allows with metoprolol and spironolactone -Diuretics on discharge 11/2: Lasix 60 mg once daily and metolazone 2.5 mg 3 times weekly in addition to daily potassium supplementation.    #Paroxysmal  atrial fibrillation on Xarelto In atrial fibrillation on telemetry, rate controlled in the 80s to 90s. -Hold metoprolol while requiring vasopressor support -Hold Xarelto with AKI, recommend heparin until her renal function normalizes.  #Acute renal failure on CKD 3 Renal function on admission severely decreased with BUN/creatinine 50/3.83 and GFR of 13.  Worsened with IV fluids.  Anticipate need for diuresis. During her prior admission with a similar significant degree of AKI, her renal function essentially normalized after multiple days of IV diuresis. -Nephrology following, appreciate input.  #Hyperbilirubinemia #Hepatic steatosis T. bili uptrending from 9.0-9.4 today.  Almost equal elevation of direct and indirect bilirubin, seen by GI and hematology during her last admission who felt this was related to underlying hepatic dysfunction from her right heart failure.  Her T. bili slowly improved after days of diuresis.  -Continue to monitor, INR is okay today at 1.5  #Elevated TSH TSH 8.9, free T4 within normal limits.  -Management per primary team  #Elevated troponin Minimally elevated with a flat trend at 51-49, in the absence of chest pain or EKG changes this is most consistent with demand/supply mismatch likely from her renal failure and not ACS   This patient's plan of care was discussed and created with Dr. Saralyn Pilar and he is in agreement.  Signed: Tristan Schroeder , PA-C 10/05/2022, 1:48 PM [facility]

## 2022-10-05 NOTE — Progress Notes (Signed)
An USGPIV (ultrasound guided PIV) has been placed for short-term vasopressor infusion. A correctly placed ivWatch must be used when administering Vasopressors. Should this treatment be needed beyond 72 hours, central line access should be obtained.  It will be the responsibility of the bedside nurse to follow best practice to prevent extravasations.   ?

## 2022-10-05 NOTE — ED Notes (Signed)
Admitting provider at bedside.

## 2022-10-05 NOTE — ED Notes (Signed)
Pt is sleeping, but hypotensive. She is easily awakened and alert and oriented after awakening. CBG performed and results 84. BP taken in bilateral limbs as well as manually with similar results; manual BP right arm 80/40, manual BP left arm 78/42. Able to palpate bilateral radial pulses. Admitting provider made aware and also aware that pt has not produced any urine while this RN has taken over care. Orders will be placed per provider and bladder scan will be performed. Charge RN, Lorriane Shire, also made aware.

## 2022-10-05 NOTE — Progress Notes (Signed)
1223- Time out complete for right IJ central line placement by Tana Conch, NP.   1235-59mg fent  1245-R IJ complete. VSS.   1312- Right radial arterial line placement by JTana Conch NP.

## 2022-10-05 NOTE — Consult Note (Signed)
NAME:  Chelsea Ramsey, MRN:  [id number], DOB:  1966-01-05, LOS: 1 ADMISSION DATE:  10/27/2022, CONSULTATION DATE:  10/05/2022 REFERRING MD:  Neomia Glass NP  REASON FOR CONSULT:  Hypotension   HPI  56 y.o female with significant PMH of drug-induced gout of left foot (03/01/2018), Anxiety, Arthritis, Asthma,COPD, Coronary artery disease, HFrEF (current EF 35-40%) DM, Fibromyalgia, GERD, Hypertension, Pulmonary HTN,  Rheumatic fever/heart disease, paroxysmal atrial fibrillation on Xarelto, COPD w/ chronic respiratory failure on baseline 3 L, ongoing tobacco use, TIA/CVA, and Sleep apnea. who presented to the ED with chief complaints of generalized weakness shortness of breath and itching.  On review of her chart, patient was recently discharged on 10/17 following admission for acute on chronic CHF exacerbation, transaminitis, AKI and altered mental status.  Per ED reports, since discharge patient has been progressively weak and not able to walk or take her medication as prescribed. Per EMS report, patient fell from her wheelchair but denied hitting her head or LOC.   ED Course: Initial vital signs showed HR of 99 beats/minute, BP  129/93 mm Hg, the RR 21 breaths/minute, and the oxygen saturation  98% on 4L [place] and a temperature of 97.19F (36.9C).   Pertinent Labs/Diagnostics Findings: Chemistry:Na+/ K+: 137/5.5.  Glucose: 64.  BUN/Cr.:50/3.83  AST/ALT: 70/36, bilirubin direct/indirect 4.7/4.3 CBC: WBC: 12.  Hgb/Hct: 8.6/30.7   Other Lab findings:   PCT: 0.69.  Lactic acid: 2.1 negative, Troponin: 51.  BNP: 785.2 Imaging:  CXR> chronic interstitial lung disease with questionable superimposed edema and small left pleural effusion.  CTH> no acute intracranial abnormality  Patient given 30 cc/kg of fluids and started on broad-spectrum antibiotics Vanco cefepime and Flagyl for spectated sepsis.  She was subsequently admitted to hospitalist service. Patient remained hypotensive despite IVF  boluses therefore was started on Levophed.  (Sepsis reassessment completed). PCCM consulted.  Past Medical History  drug-induced gout of left foot (03/01/2018), Anxiety, Arthritis, Asthma,COPD, Coronary artery disease, HFrEF (current EF 35-40%) DM, Fibromyalgia, GERD, Hypertension, Pulmonary HTN,  Rheumatic fever/heart disease, paroxysmal atrial fibrillation on Xarelto, COPD w/ chronic respiratory failure on baseline 3 L, ongoing tobacco use, TIA/CVA, and Sleep apnea  Significant Hospital Events   11/5: Admit to hospitalist service with sepsis of unknown origin and AMS. 11/6: Remained hypotensive despite IVF and started on pressors. PCCM consulted  Consults:  PCCM Cardiology  Procedures:  None  Significant Diagnostic Tests:  11/5: Chest Xray> 11/5: Noncontrast CT head>  Micro Data:  11/5: SARS-CoV-2 PCR> negative 11/5: Influenza PCR> negative 11/5: Blood culture x2> 11/5: Urine Culture> 11/5: MRSA PCR>>   Antimicrobials:  Vancomycin 11/5>> Cefepime 11/5>>  OBJECTIVE  Blood pressure (!) 88/68, pulse 83, temperature (!) 97.4 F (36.3 C), temperature source Oral, resp. rate 14, last menstrual period 11/08/2009, SpO2 96 %.        Intake/Output Summary (Last 24 hours) at 10/05/2022 0154 Last data filed at 10/05/2022 0125 Gross per 24 hour  Intake 2962.5 ml  Output --  Net 2962.5 ml   There were no vitals filed for this visit.  Physical Examination  GENERAL: 56 year-old critically ill patient lying in the bed with no acute distress.  EYES: Pupils equal, round, reactive to light and accommodation. No scleral icterus. Extraocular muscles intact.  HEENT: Head atraumatic, normocephalic. Oropharynx and nasopharynx clear.  NECK:  Supple, no jugular venous distention. No thyroid enlargement, no tenderness.  LUNGS: Normal breath sounds bilaterally, no wheezing, rales,rhonchi or crepitation. No use of accessory muscles of respiration.  CARDIOVASCULAR: S1, S2 normal. No murmurs,  rubs, or gallops.  ABDOMEN: Soft, nontender, nondistended. Bowel sounds present. No organomegaly or mass.  EXTREMITIES: Upper and lower extremities are atraumatic in appearance without tenderness or deformity. Mild trace edema of bilateral lower extremities. Unable to assess muscle strength i. Capillary refill is less than 3 seconds in all extremities. Pulses palpable.  NEUROLOGIC:The patient is  lethargic but arouses easily  with somewhat slurred speech. Moves all extremities. Sensation is intact bilaterally. Reflexes 2+ bilaterally.  intact. Gait not checked.  PSYCHIATRIC: The patient is drowsy, oriented to self only SKIN: No obvious rash, lesion, or ulcer.   Labs/imaging that I havepersonally reviewed  (right click and "Reselect all SmartList Selections" daily)     Labs   CBC: Recent Labs  Lab 09/28/22 0628 09/29/22 0344 09/30/22 1049 10/01/22 0627 10/01/2022 1602  WBC 8.7 9.1 8.1 7.6 12.0*  NEUTROABS  --   --   --   --  9.4*  HGB 9.1* 9.3* 9.3* 9.1* 8.6*  HCT 32.0* 32.4* 32.8* 31.8* 30.7*  MCV 83.1 82.4 84.8 85.3 88.5  PLT 121* 124* 143* 132* 818    Basic Metabolic Panel: Recent Labs  Lab 09/28/22 0628 09/29/22 0344 09/30/22 0719 10/01/22 0627 10/03/2022 1602  NA 137 138 138 138 137  K 3.7 3.3* 3.5 4.4 5.5*  CL 90* 89* 89* 89* 90*  CO2 37* 39* 38* 37* 34*  GLUCOSE 80 78 105* 78 64*  BUN 37* 35* 33* 36* 50*  CREATININE 1.26* 1.23* 1.13* 1.28* 3.83*  CALCIUM 8.7* 8.7* 8.9 9.1 9.3  MG 1.8  --  1.7  --  2.2  PHOS 3.0  --   --   --   --    GFR: Estimated Creatinine Clearance: 19.7 mL/min (A) (by C-G formula based on SCr of 3.83 mg/dL (H)). Recent Labs  Lab 09/29/22 0344 09/30/22 1049 10/01/22 0627 10/07/2022 1602 10/13/2022 1707  PROCALCITON  --   --   --   --  0.69  WBC 9.1 8.1 7.6 12.0*  --   LATICACIDVEN  --   --   --   --  2.1*    Liver Function Tests: Recent Labs  Lab 09/28/22 0628 09/29/22 0344 09/30/22 0719 10/01/22 0627 10/18/2022 1602  AST 50* 58*  61* 63* 70*  ALT '27 30 30 '$ 34 36  ALKPHOS 81 86 94 89 93  BILITOT 7.4* 7.6* 7.7* 7.8* 9.0*  PROT 6.3* 6.7 6.6 6.7 6.9  ALBUMIN 2.3* 2.4* 2.5* 2.4* 2.6*   No results for input(s): "LIPASE", "AMYLASE" in the last 168 hours. No results for input(s): "AMMONIA" in the last 168 hours.  ABG    Component Value Date/Time   PHART 7.44 09/26/2022 0830   PCO2ART 71 ([place]) 09/26/2022 0830   PO2ART 94 09/26/2022 0830   HCO3 48.2 (H) 09/26/2022 0830   ACIDBASEDEF 0.2 12/20/2017 2033   O2SAT 99.2 09/26/2022 0830     Coagulation Profile: Recent Labs  Lab 09/29/22 0344  INR 1.4*    Cardiac Enzymes: No results for input(s): "CKTOTAL", "CKMB", "CKMBINDEX", "TROPONINI" in the last 168 hours.  HbA1C: Hemoglobin A1C  Date/Time Value Ref Range Status  03/08/2020 03:52 PM 6.7 (A) 4.0 - 5.6 % Final  08/21/2014 12:00 AM 5.2  Final  06/03/2012 04:52 AM 4.5 4.2 - 6.3 % Final    Comment:    The American Diabetes Association recommends that a primary goal of therapy should be <7% and that physicians should reevaluate the treatment  regimen in patients with HbA1c values consistently >8%.    Hgb A1c MFr Bld  Date/Time Value Ref Range Status  09/15/2022 12:59 PM 4.5 (L) 4.8 - 5.6 % Final    Comment:    (NOTE) Pre diabetes:          5.7%-6.4%  Diabetes:              >6.4%  Glycemic control for   <7.0% adults with diabetes   08/24/2019 04:54 PM 6.2 (H) 4.8 - 5.6 % Final    Comment:    (NOTE)         Prediabetes: 5.7 - 6.4         Diabetes: >6.4         Glycemic control for adults with diabetes: <7.0     CBG: Recent Labs  Lab 10/05/22 0058  GLUCAP 85    Review of Systems:   UNABLE TO OBTAIN PATIENT IS ALTERED  Past Medical History  She,  has a past medical history of Acute drug-induced gout of left foot (03/01/2018), Allergy, Anxiety, Arthritis, Asthma, CHF (congestive heart failure) ([place]), COPD (chronic obstructive pulmonary disease) ([place]), Coronary artery disease, Diabetes  mellitus without complication ([place]), Dysrhythmia, Fibromyalgia, GERD (gastroesophageal reflux disease), Hypertension, Pneumonia, PUD (peptic ulcer disease), Pulmonary HTN ([place]), Rheumatic fever/heart disease, and Sleep apnea.   Surgical History    Past Surgical History:  Procedure Laterality Date   ADENOIDECTOMY     COLONOSCOPY WITH PROPOFOL N/A 11/06/2019   Procedure: COLONOSCOPY WITH PROPOFOL;  Surgeon: Virgel Manifold, MD;  Location: [facility] ENDOSCOPY;  Service: Endoscopy;  Laterality: N/A;  2 day prep    COLONOSCOPY WITH PROPOFOL N/A 06/29/2022   Procedure: COLONOSCOPY WITH PROPOFOL;  Surgeon: Lin Landsman, MD;  Location: [facility] ENDOSCOPY;  Service: Gastroenterology;  Laterality: N/A;   CORONARY ARTERY BYPASS GRAFT     June 27 2018   ESOPHAGOGASTRODUODENOSCOPY (EGD) WITH PROPOFOL N/A 05/25/2018   Procedure: ESOPHAGOGASTRODUODENOSCOPY (EGD) WITH PROPOFOL;  Surgeon: Lucilla Lame, MD;  Location: [facility] ENDOSCOPY;  Service: Endoscopy;  Laterality: N/A;   ESOPHAGOGASTRODUODENOSCOPY (EGD) WITH PROPOFOL N/A 11/06/2019   Procedure: ESOPHAGOGASTRODUODENOSCOPY (EGD) WITH PROPOFOL;  Surgeon: Virgel Manifold, MD;  Location: [facility] ENDOSCOPY;  Service: Endoscopy;  Laterality: N/A;   ESOPHAGOGASTRODUODENOSCOPY (EGD) WITH PROPOFOL N/A 06/29/2022   Procedure: ESOPHAGOGASTRODUODENOSCOPY (EGD) WITH PROPOFOL;  Surgeon: Lin Landsman, MD;  Location: [facility] ENDOSCOPY;  Service: Gastroenterology;  Laterality: N/A;   GIVENS CAPSULE STUDY N/A 07/22/2022   Procedure: GIVENS CAPSULE STUDY;  Surgeon: Lin Landsman, MD;  Location: [facility] ENDOSCOPY;  Service: Gastroenterology;  Laterality: N/A;   MITRAL VALVE REPLACEMENT     MULTIPLE TOOTH EXTRACTIONS     TONSILLECTOMY     TOTAL KNEE ARTHROPLASTY Right 09/04/2016   Procedure: TOTAL KNEE ARTHROPLASTY; with lateral release;  Surgeon: Earlie Server, MD;  Location: [place];  Service: Orthopedics;  Laterality: Right;     Social History   reports that she has  been smoking cigarettes. She has been smoking an average of .1 packs per day. She has never used smokeless tobacco. She reports that she does not currently use drugs. She reports that she does not drink alcohol.   Family History   Her family history includes Asthma in her mother; Breast cancer in her maternal aunt; COPD in her mother; Cancer in her mother; Congestive Heart Failure in her father; Rheum arthritis in her mother.   Allergies Allergies  Allergen Reactions   Amiodarone Nausea And Vomiting   Aspirin Swelling  Flexeril [Cyclobenzaprine] Swelling   Trazamine [Trazodone & Diet Manage Prod] Nausea And Vomiting   Codeine Rash   Tramadol Rash     Home Medications  Prior to Admission medications   Medication Sig Start Date End Date Taking? Authorizing Provider  albuterol (VENTOLIN HFA) 108 (90 Base) MCG/ACT inhaler Inhale 1-2 puffs into the lungs every 6 (six) hours as needed for wheezing or shortness of breath.    [provider]  allopurinol (ZYLOPRIM) 100 MG tablet Take 1 tablet (100 mg total) by mouth 2 (two) times daily. 03/05/20   Milinda Pointer, MD  Cholecalciferol (VITAMIN D) 50 MCG (2000 UT) CAPS Take 1 capsule (2,000 Units total) by mouth daily. 06/19/21   Alisa Graff, FNP  colchicine 0.6 MG tablet Take 1 tablet (0.6 mg total) by mouth daily. As directed 03/05/20   Milinda Pointer, MD  diazepam (VALIUM) 10 MG tablet Take 10 mg by mouth 2 (two) times daily as needed for anxiety. 07/16/22   [provider]  docusate sodium (COLACE) 100 MG capsule Take 100 mg by mouth 2 (two) times daily as needed for mild constipation or moderate constipation.    [provider]  empagliflozin (JARDIANCE) 10 MG TABS tablet Take 10 mg by mouth daily.    [provider]  furosemide (LASIX) 20 MG tablet Take 3 tablets (60 mg total) by mouth daily. 10/02/22 11/01/22  Wyvonnia Dusky, MD  gabapentin (NEURONTIN) 600 MG tablet Take 0.5 tablets (300 mg total)  by mouth 2 (two) times daily. Patient taking differently: Take 600 mg by mouth 3 (three) times daily. 12/06/18   Vaughan Basta, MD  ipratropium-albuterol (DUONEB) 0.5-2.5 (3) MG/3ML SOLN INHALE THE CONTENTS OF 1 VIAL(3MLS) VIA NEBULIZER 3 TIMES DAILY AS NEEDED 10/24/19   Carles Collet M, PA-C  Iron-FA-B Cmp-C-Biot-Probiotic (FUSION PLUS) CAPS Take 1 capsule by mouth daily. Patient not taking: Reported on 09/16/2022 06/25/22   Lin Landsman, MD  lactulose (CHRONULAC) 10 GM/15ML solution Take 45 mLs (30 g total) by mouth 2 (two) times daily. 10/01/22 10/31/22  Wyvonnia Dusky, MD  metolazone (ZAROXOLYN) 2.5 MG tablet Take 1 tablet (2.5 mg total) by mouth every Monday, Wednesday, and Friday. 10/02/22 11/01/22  Wyvonnia Dusky, MD  metoprolol tartrate (LOPRESSOR) 50 MG tablet Take 1 tablet (50 mg total) by mouth 2 (two) times daily. 10/01/22 10/31/22  Wyvonnia Dusky, MD  nitroGLYCERIN (NITROSTAT) 0.4 MG SL tablet Place 1 tablet (0.4 mg total) under the tongue every 5 (five) minutes as needed for chest pain. 06/19/21   Alisa Graff, FNP  omeprazole (PRILOSEC) 40 MG capsule Take 40 mg by mouth daily.    [provider]  oxyCODONE ER 9 MG C12A Take 9 mg by mouth 2 (two) times daily.    [provider]  potassium chloride (KLOR-CON) 10 MEQ tablet Take 4 tablets (40 mEq total) by mouth 2 (two) times daily. And additional 41mq on metolazone days Patient taking differently: Take 40 mEq by mouth See admin instructions. Take 4 tablets (410m) by mouth twice daily - take an additional 2 tablets (2058m by mouth when taking metolazone 01/05/22   HacAlisa GraffNP  promethazine (PHENERGAN) 12.5 MG tablet Take 1 tablet (12.5 mg total) by mouth every 8 (eight) hours as needed for nausea or vomiting. Patient taking differently: Take 12.5 mg by mouth 2 (two) times daily as needed for nausea or vomiting. 02/20/20   BacBrita RompngDionne BucyD  rifaximin (XIFAXAN) 550 MG TABS tablet  Take 1 tablet (550 mg total) by mouth 2 (two) times daily. 10/01/22 10/31/22  Wyvonnia Dusky, MD  rivaroxaban (XARELTO) 20 MG TABS tablet Take 20 mg by mouth daily with supper.    [provider]  rosuvastatin (CRESTOR) 10 MG tablet Take 10 mg by mouth daily.    [provider]  spironolactone (ALDACTONE) 25 MG tablet Take 0.5 tablets (12.5 mg total) by mouth daily. 10/02/22 11/01/22  Wyvonnia Dusky, MD  Scheduled Meds:  heparin injection (subcutaneous)  5,000 Units Subcutaneous Q8H   pantoprazole  80 mg Oral Daily   rifaximin  550 mg Oral BID   rosuvastatin  10 mg Oral Daily   thiamine (VITAMIN B1) injection  100 mg Intravenous Daily   vancomycin variable dose per unstable renal function (pharmacist dosing)   Does not apply See admin instructions   Continuous Infusions:  sodium chloride 150 mL/hr at 10/05/22 0125   sodium chloride     ceFEPime (MAXIPIME) IV     norepinephrine (LEVOPHED) Adult infusion 5 mcg/min (10/05/22 0153)   PRN Meds:.acetaminophen **OR** acetaminophen, docusate sodium, hydrALAZINE, LORazepam, melatonin, nitroGLYCERIN, ondansetron **OR** ondansetron (ZOFRAN) IV, oxyCODONE-acetaminophen, senna-docusate  Active Hospital Problem list     Assessment & Plan:  Acute on Chronic  Hypoxic Respiratory Failure secondary to Acute on chronic Decompensated HFrEF, AECOPD and ?underlying Pneumonia PMHx: COPD w/ chronic respiratory failure on baseline 3 L, ongoing tobacco use , OSA on CPAP -Supplemental O2 as needed to maintain O2 saturations 88 to 92% -Consider NIPPV/BiPAP, wean as tolerated -High risk for intubation -Follow intermittent ABG and chest x-ray as needed -As needed bronchodilators -Encourage smoking cessation  Sepsis with septic shock of unknown source Lactic: 2.1, Baseline PCT: 0.69, UA:? UTI , CXR: ? Pneumonia Initial interventions/workup included: 3 L of NS/LR & Cefepime/ Vancomycin/ meets SIRS criteria: Shock Index  (SI)2 -Supplemental oxygen as needed, to maintain SpO2 > 90% -F/u cultures, trend lactic/ PCT -Monitor WBC/ fever curve -IV antibiotics: cefepime & vancomycin -Gentle IVF hydration as needed -Pressors for MAP goal >65 -Strict I/O's  Septic vs Cardiogenic Shock  Acute on Chronic Decompensated HFrEF (current EF 20-25%) PAF PMHx: CAD, HLD, HTNsevere pulmonary hypertension, RV dysfunction,Rheumatic mitral valve stenosis s/p MVR 2019  -BNP on admission was elevated at 558.5 with small probable interstitial edema and small left pleural effusion on cxr. -Continuous cardiac monitoring -Diuresis as BP and renal function permits ~ currently holding due to AKI and hypotension -Hold metoprolol and Spironolactone for now -Hold Xarelto -Continue midodrine -Cardiology consult if appropriate   AKI on CKD Stage III Hyperkalemia -Monitor I&O's / urinary output -Follow BMP -Ensure adequate renal perfusion -Avoid nephrotoxic agents as able -Replace electrolytes as indicated   Acute Metabolic Encephalopathy ~ Hx: TIA/CVA, polypharmacy on multiple sedating meds -Provide supportive care -Avoid sedating meds as able -CT Head  negative for acute intracranial abnormality   Diabetes mellitus HgbA1c  -CBG's Q4; Target range of 140 to 180 -SSI -Follow ICU Hypo/Hyperglycemia protocol   Best practice:  Diet:  NPO Pain/Anxiety/Delirium protocol (if indicated): No VAP protocol (if indicated): Not indicated DVT prophylaxis: Subcutaneous Heparin GI prophylaxis: PPI Glucose control:  SSI Yes Central venous access:  N/A Arterial line:  N/A Foley:  N/A Mobility:  bed rest  PT consulted: N/A Last date of multidisciplinary goals of care discussion [11/6] Code Status:  DNR Disposition: Stepdown   = Goals of Care = Code Status Order: DNR  Primary Emergency Contact: Smith,Jerry Patient wishes to pursue ongoing treatment, but concurred  that if deteriorated to pulselessness, patient would prefer a  natural death as opposed to invasive measures such as CPR and intubation.  Family should be immediately contacted in such situations.  Critical care time: 45 minutes       Rufina Falco, DNP, CCRN, FNP-C, AGACNP-BC Acute Care Nurse Practitioner [facility]  PCCM on call pager [phone] until 7 am

## 2022-10-05 NOTE — Progress Notes (Signed)
09/26/22 S[employer] smear neg 09/28/22 X 2 samples- Smear neg No active TB

## 2022-10-05 NOTE — Consult Note (Signed)
ANTICOAGULATION CONSULT NOTE - Follow Up Consult  Pharmacy Consult for heparin gtt Indication: atrial fibrillation  Allergies  Allergen Reactions   Amiodarone Nausea And Vomiting   Aspirin Swelling   Flexeril [Cyclobenzaprine] Swelling   Trazamine [Trazodone & Diet Manage Prod] Nausea And Vomiting   Codeine Rash   Tramadol Rash    Patient Measurements: Height: '5\' 4"'$  (162.6 cm) Weight: 106.3 kg (234 lb 5.6 oz) IBW/kg (Calculated) : 54.7 Heparin Dosing Weight: 79.8 kg  Vital Signs: Temp: 97.8 F (36.6 C) (11/06 1600) Temp Source: Axillary (11/06 1600) BP: 98/67 (11/06 1700) Pulse Rate: 91 (11/06 1700)  Labs: Recent Labs    10/24/2022 1602 09/30/2022 1707 10/05/22 0306 10/05/22 1419  HGB 8.6*  --  9.0*  --   HCT 30.7*  --  32.5*  --   PLT 258  --  290  --   LABPROT  --   --   --  18.0*  INR  --   --   --  1.5*  CREATININE 3.83*  --  3.87*  --   TROPONINIHS 51* 49*  --   --    Estimated Creatinine Clearance: 19.5 mL/min (A) (by C-G formula based on SCr of 3.87 mg/dL (H)).  Medications:  Heparin Dosing Weight: 79.8 kg PTA: Xarelto 20 QHS (last dose on 11/03 PTA) Inpatient: [facility] 5k Q8h  >> heparin gtt   Assessment: 56yo F w/ h/o drug and alcohol abuse, HTN, HLD, MDD and AFib, who was admitted to [facility] on 10/27/2022 for severe sepsis with acute organ dysfunction. Pharmacy consulted to transition of prophylactic heparin to infusion. Pt xarelto for Afib PTA is on hold ISO severe AKI until renal fxn normalizes.   Date Time aPTT/HL Rate/Comment       Baseline Labs: aPTT - unk; INR - unk Hgb - 9.0; Plts - 290  Goal of Therapy:  Heparin level 0.3-0.7 units/ml aPTT 66-102 seconds Monitor platelets by anticoagulation protocol: Yes   Plan:  Last dose of Xarelto '20mg'$  on 11/03 PM PTA, but pt with significant AKI/new organ dysfunction so anti-xa lab interference may persist beyond typical half-life. Give 4000 units bolus x1; then start heparin infusion at 1250 units/hr Check  aPTT/Anti-Xa level in 8 hours and daily once consecutively therapeutic.  Titrate by aPTT's until lab correlation is noted, then titrate by anti-xa alone. Continue to monitor H&H and platelets daily while on heparin gtt.  Shanon Brow Rhegan Trunnell 10/05/2022,5:33 PM

## 2022-10-05 NOTE — ED Notes (Signed)
IV team at bedside to assist with access and blood draw

## 2022-10-05 NOTE — ED Notes (Signed)
Pt has increased lethargy and responds to verbal stimuli with eye opening but only responds to verbal questions with inaudible moans. Admitting providers made aware of change

## 2022-10-06 ENCOUNTER — Ambulatory Visit: Payer: Medicaid Other | Admitting: Family

## 2022-10-06 ENCOUNTER — Inpatient Hospital Stay: Payer: Medicaid Other

## 2022-10-06 DIAGNOSIS — F1911 Other psychoactive substance abuse, in remission: Secondary | ICD-10-CM

## 2022-10-06 DIAGNOSIS — Z7901 Long term (current) use of anticoagulants: Secondary | ICD-10-CM

## 2022-10-06 DIAGNOSIS — I959 Hypotension, unspecified: Secondary | ICD-10-CM

## 2022-10-06 DIAGNOSIS — Z515 Encounter for palliative care: Secondary | ICD-10-CM | POA: Diagnosis not present

## 2022-10-06 DIAGNOSIS — R531 Weakness: Secondary | ICD-10-CM

## 2022-10-06 DIAGNOSIS — Z66 Do not resuscitate: Secondary | ICD-10-CM | POA: Diagnosis not present

## 2022-10-06 DIAGNOSIS — K760 Fatty (change of) liver, not elsewhere classified: Secondary | ICD-10-CM

## 2022-10-06 DIAGNOSIS — N179 Acute kidney failure, unspecified: Secondary | ICD-10-CM

## 2022-10-06 DIAGNOSIS — A419 Sepsis, unspecified organism: Secondary | ICD-10-CM | POA: Diagnosis not present

## 2022-10-06 DIAGNOSIS — R6520 Severe sepsis without septic shock: Secondary | ICD-10-CM | POA: Diagnosis not present

## 2022-10-06 DIAGNOSIS — I480 Paroxysmal atrial fibrillation: Secondary | ICD-10-CM

## 2022-10-06 DIAGNOSIS — J9621 Acute and chronic respiratory failure with hypoxia: Secondary | ICD-10-CM

## 2022-10-06 DIAGNOSIS — Z7189 Other specified counseling: Secondary | ICD-10-CM | POA: Diagnosis not present

## 2022-10-06 LAB — COMPREHENSIVE METABOLIC PANEL
ALT: 40 U/L (ref 0–44)
AST: 70 U/L — ABNORMAL HIGH (ref 15–41)
Albumin: 2.7 g/dL — ABNORMAL LOW (ref 3.5–5.0)
Alkaline Phosphatase: 91 U/L (ref 38–126)
Anion gap: 11 (ref 5–15)
BUN: 51 mg/dL — ABNORMAL HIGH (ref 6–20)
CO2: 30 mmol/L (ref 22–32)
Calcium: 8.5 mg/dL — ABNORMAL LOW (ref 8.9–10.3)
Chloride: 95 mmol/L — ABNORMAL LOW (ref 98–111)
Creatinine, Ser: 3.59 mg/dL — ABNORMAL HIGH (ref 0.44–1.00)
GFR, Estimated: 14 mL/min — ABNORMAL LOW (ref >60)
Glucose, Bld: 101 mg/dL — ABNORMAL HIGH (ref 70–99)
Potassium: 4.6 mmol/L (ref 3.5–5.1)
Sodium: 136 mmol/L (ref 135–145)
Total Bilirubin: 9.4 mg/dL — ABNORMAL HIGH (ref 0.3–1.2)
Total Protein: 7 g/dL (ref 6.5–8.1)

## 2022-10-06 LAB — GLUCOSE, CAPILLARY
Glucose-Capillary: 101 mg/dL — ABNORMAL HIGH (ref 70–99)
Glucose-Capillary: 104 mg/dL — ABNORMAL HIGH (ref 70–99)
Glucose-Capillary: 104 mg/dL — ABNORMAL HIGH (ref 70–99)
Glucose-Capillary: 104 mg/dL — ABNORMAL HIGH (ref 70–99)
Glucose-Capillary: 94 mg/dL (ref 70–99)
Glucose-Capillary: 98 mg/dL (ref 70–99)

## 2022-10-06 LAB — HEPARIN LEVEL (UNFRACTIONATED)
Heparin Unfractionated: 0.27 IU/mL — ABNORMAL LOW (ref 0.30–0.70)
Heparin Unfractionated: 0.32 IU/mL (ref 0.30–0.70)
Heparin Unfractionated: 0.14 IU/mL — ABNORMAL LOW (ref 0.30–0.70)

## 2022-10-06 LAB — COOXEMETRY PANEL
Carboxyhemoglobin: 2.6 % — ABNORMAL HIGH (ref 0.5–1.5)
Methemoglobin: <0.7 % (ref 0.0–1.5)
O2 Saturation: 64.5 %
Total hemoglobin: 9.2 g/dL — ABNORMAL LOW (ref 12.0–16.0)
Total oxygen content: 62.6 %

## 2022-10-06 LAB — CBC
HCT: 32.4 % — ABNORMAL LOW (ref 36.0–46.0)
Hemoglobin: 9.2 g/dL — ABNORMAL LOW (ref 12.0–15.0)
MCH: 24.7 pg — ABNORMAL LOW (ref 26.0–34.0)
MCHC: 28.4 g/dL — ABNORMAL LOW (ref 30.0–36.0)
MCV: 86.9 fL (ref 80.0–100.0)
Platelets: 307 10*3/uL (ref 150–400)
RBC: 3.73 MIL/uL — ABNORMAL LOW (ref 3.87–5.11)
RDW: 26.6 % — ABNORMAL HIGH (ref 11.5–15.5)
WBC: 13.5 10*3/uL — ABNORMAL HIGH (ref 4.0–10.5)
nRBC: 0.8 % — ABNORMAL HIGH (ref 0.0–0.2)

## 2022-10-06 LAB — MTB-RIF NAA WITH AFB CULTURE, SPUTUM

## 2022-10-06 LAB — PHOSPHORUS: Phosphorus: 5.5 mg/dL — ABNORMAL HIGH (ref 2.5–4.6)

## 2022-10-06 LAB — APTT
aPTT: 87 seconds — ABNORMAL HIGH (ref 24–36)
aPTT: 93 seconds — ABNORMAL HIGH (ref 24–36)
aPTT: 112 seconds — ABNORMAL HIGH (ref 24–36)
aPTT: 74 seconds — ABNORMAL HIGH (ref 24–36)

## 2022-10-06 LAB — MAGNESIUM: Magnesium: 1.9 mg/dL (ref 1.7–2.4)

## 2022-10-06 LAB — BASIC METABOLIC PANEL
Anion gap: 13 (ref 5–15)
BUN: 53 mg/dL — ABNORMAL HIGH (ref 6–20)
CO2: 29 mmol/L (ref 22–32)
Calcium: 8.8 mg/dL — ABNORMAL LOW (ref 8.9–10.3)
Chloride: 97 mmol/L — ABNORMAL LOW (ref 98–111)
Creatinine, Ser: 3.29 mg/dL — ABNORMAL HIGH (ref 0.44–1.00)
GFR, Estimated: 16 mL/min — ABNORMAL LOW (ref >60)
Glucose, Bld: 105 mg/dL — ABNORMAL HIGH (ref 70–99)
Potassium: 4.1 mmol/L (ref 3.5–5.1)
Sodium: 139 mmol/L (ref 135–145)

## 2022-10-06 LAB — CORTISOL-AM, BLOOD: Cortisol - AM: 15.5 ug/dL (ref 6.7–22.6)

## 2022-10-06 LAB — PROCALCITONIN: Procalcitonin: 0.57 ng/mL

## 2022-10-06 LAB — PROTIME-INR
INR: 1.8 — ABNORMAL HIGH (ref 0.8–1.2)
Prothrombin Time: 20.8 seconds — ABNORMAL HIGH (ref 11.4–15.2)

## 2022-10-06 MED ORDER — HEPARIN BOLUS VIA INFUSION
2400.0000 [IU] | Freq: Once | INTRAVENOUS | Status: AC
  Administered 2022-10-06: 2400 [IU] via INTRAVENOUS
  Filled 2022-10-06: qty 2400

## 2022-10-06 MED ORDER — HALOPERIDOL LACTATE 5 MG/ML IJ SOLN
2.5000 mg | Freq: Once | INTRAMUSCULAR | Status: AC | PRN
  Administered 2022-10-06: 2.5 mg via INTRAVENOUS
  Filled 2022-10-06: qty 1

## 2022-10-06 MED ORDER — TECHNETIUM TC 99M MEBROFENIN IV KIT
7.2700 | PACK | Freq: Once | INTRAVENOUS | Status: AC | PRN
  Administered 2022-10-06: 7.27 via INTRAVENOUS

## 2022-10-06 MED ORDER — PIPERACILLIN-TAZOBACTAM 3.375 G IVPB
3.3750 g | Freq: Three times a day (TID) | INTRAVENOUS | Status: AC
  Administered 2022-10-06 – 2022-10-07 (×5): 3.375 g via INTRAVENOUS
  Filled 2022-10-06 (×5): qty 50

## 2022-10-06 MED ORDER — FUROSEMIDE 10 MG/ML IJ SOLN
4.0000 mg/h | INTRAVENOUS | Status: DC
  Administered 2022-10-06 – 2022-10-08 (×2): 4 mg/h via INTRAVENOUS
  Filled 2022-10-06 (×2): qty 20

## 2022-10-06 NOTE — TOC Initial Note (Signed)
Transition of Care [facility]) - Initial/Assessment Note    Patient Details  Name: Chelsea Ramsey MRN: [id number] Date of Birth: 21-Dec-1965  Transition of Care [facility]) CM/SW Contact:    Shelbie Hutching, RN Phone Number: 10/06/2022, 3:24 PM  Clinical Narrative:                 Patient admitted to the hospital for sepsis, recently discharged on 11/2.  Patient is currently in the ICU requiring vasopressors to maintain hemodynamic stability.   RNCM met with patient at the bedside this morning, patient reports not feeling well.  She wants to go home.  Patient lives with her mother, she says that when she was discharged last she was not feeling well.  Patient was set up with DME and [place] during last admission.    TOC will cont to follow.   Expected Discharge Plan: Skilled Nursing Facility Barriers to Discharge: Continued Medical Work up   Patient Goals and CMS Choice Patient states their goals for this hospitalization and ongoing recovery are:: patient wants to go home when she feels better      Expected Discharge Plan and Services Expected Discharge Plan: [place]   Discharge Planning Services: CM Consult   Living arrangements for the past 2 months: Single Family Home                 DME Arranged: N/A DME Agency: NA                  Prior Living Arrangements/Services Living arrangements for the past 2 months: Single Family Home Lives with:: Parents Patient language and need for interpreter reviewed:: Yes Do you feel safe going back to the place where you live?: Yes      Need for Family Participation in Patient Care: Yes (Comment) Care giver support system in place?: Yes (comment) Current home services: DME, Home PT, Home RN, Home OT (wheelchair, hospital bed, [facility]) Criminal Activity/Legal Involvement Pertinent to Current Situation/Hospitalization: No - Comment as needed  Activities of Daily Living      Permission Sought/Granted Permission sought to  share information with : Case Manager, Family Supports Permission granted to share information with : Yes, Verbal Permission Granted  Share Information with NAME: Jerilee Field     Permission granted to share info w Relationship: mother  Permission granted to share info w Contact Information: [phone]  Emotional Assessment Appearance:: Appears stated age Attitude/Demeanor/Rapport: Engaged Affect (typically observed): Accepting Orientation: : Oriented to Self, Oriented to Place, Oriented to  Time, Oriented to Situation Alcohol / Substance Use: Not Applicable Psych Involvement: No (comment)  Admission diagnosis:  Generalized weakness [R53.1] AKI (acute kidney injury) ([place]) [N17.9] Severe sepsis with acute organ dysfunction ([place]) [A41.9, R65.20] Acute on chronic respiratory failure with hypoxia ([place]) [J96.21] Sepsis, due to unspecified organism, unspecified whether acute organ dysfunction present [facility]) [A41.9] Patient Active Problem List   Diagnosis Date Noted   Severe sepsis with acute organ dysfunction ([place]) 10/03/2022   Altered mental status 10/21/2022   Hypotension 10/22/2022   Hepatic steatosis 10/11/2022   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   Obesity (BMI 30-39.9) 09/28/2022   Hypomagnesemia 82/42/3536   Acute metabolic encephalopathy 14/43/1540   Epistaxis 09/22/2022   Acute on chronic HFrEF (heart failure with reduced ejection fraction) ([place]) 09/20/2022   Hypokalemia 09/20/2022   Symptomatic anemia    Abdominal pain 09/16/2022   Acute renal failure ([place]) 09/16/2022   Elevated LFTs 09/16/2022   Hyperbilirubinemia  Iron deficiency anemia    Non compliance w medication regimen 01/15/2020   Pain medication agreement broken 27/25/3664   History of illicit drug use 40/34/7425   Misuse of prescription only drugs 01/15/2020   History of gastric ulcer 01/08/2020   Stomach irritation 01/08/2020   Esophageal dysphagia 01/08/2020   Class 3 severe obesity due to  excess calories with serious comorbidity in adult [facility]) 01/08/2020   Pharmacologic therapy 12/14/2019   Prediabetes 09/27/2019   Numbness 08/03/2019   Headache disorder 08/03/2019   Elevated uric acid in blood 02/08/2019   Hyperglycemia 02/08/2019   Secondary osteoarthritis of multiple sites 02/08/2019   Osteoarthritis of knees (Bilateral) (R>L) 02/08/2019   Chronic gout of multiple sites, unspecified cause 02/08/2019   Chronic musculoskeletal pain (Fourth Area of Pain) 02/08/2019   Epidural lipomatosis 02/08/2019   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/08/2019   History of cocaine use 01/18/2019   Chronic gout involving toe of foot, unspecified cause (Left) 01/18/2019   Chronic hip pain (Left) 01/18/2019   Osteoarthritis of sacroiliac joints (Bilateral) 01/18/2019   Other specified dorsopathies, sacral and sacrococcygeal region 01/18/2019   Lumbar facet arthropathy (Bilateral) 01/05/2019   Melena 01/03/2019   Acute esophagogastric ulcer 01/03/2019   Spondylosis without myelopathy or radiculopathy, lumbar region 12/27/2018   Lumbar facet syndrome (Bilateral) 12/27/2018   DDD (degenerative disc disease), lumbar 12/27/2018   Chronic low back pain (Primary Area of Pain) (Bilateral) (L>R) w/o sciatica 12/27/2018   Abnormal MRI, lumbar spine (01/05/2018) 12/27/2018   Elevated sed rate 12/19/2018   Elevated C-reactive protein 12/19/2018   Diplopia 12/14/2018   Dizziness 12/14/2018   Headache, chronic daily 12/14/2018   Numbness and tingling of both feet 12/14/2018   CHF (congestive heart failure) ([place]) 12/04/2018   Chronic anticoagulation (XARELTO) 11/14/2018   Vitamin D deficiency 11/14/2018   Chronic knee pain after total replacement (Fifth Area of Pain) (Right) 11/14/2018   Chest pain with moderate risk of acute coronary syndrome 10/17/2018   Chronic low back pain (Primary Area of Pain) (Bilateral) w/ sciatica (Left) 10/10/2018   Chronic lower extremity pain (Secondary Area of Pain)  (Left) 10/10/2018   Sternal pain (Tertiary Area of Pain) 10/10/2018   Fibromyalgia (Fourth Area of Pain) 10/10/2018   Chronic knee pain (Right) 10/10/2018   Chronic sacroiliac joint pain (Bilateral) (L>R) 10/10/2018   Chronic pain syndrome 10/10/2018   Long term current use of opiate analgesic 10/10/2018   Screening for colon cancer 10/10/2018   Disorder of skeletal system 10/10/2018   Problems influencing health status 10/10/2018   HTN (hypertension) 07/26/2018   S/P MVR (mitral valve replacement) 06/20/2018   Shortness of breath 05/27/2018   GIB (gastrointestinal bleeding) 05/22/2018   A-fib ([place]) 04/22/2018   Insomnia 12/25/2016   Osteoarthritis of knee (Right) 09/04/2016   Tobacco dependence 05/08/2015   GAD (generalized anxiety disorder) 03/13/2015   Moderate to severe pulmonary hypertension ([place]) 03/13/2015   Rheumatic heart disease, unspecified 03/13/2015   Chronic diastolic heart failure ([place]) 02/25/2015   Depression 02/25/2015   History of substance abuse ([place]) 02/25/2015   PCP:  Emelia Loron, NP Pharmacy:   [facility], [place] [address] [place] [address] Phone: [phone] Fax: [phone]     Social Determinants of Health (SDOH) Interventions    Readmission Risk Interventions     No data to display

## 2022-10-06 NOTE — Consult Note (Signed)
ANTICOAGULATION CONSULT NOTE  Pharmacy Consult for heparin gtt Indication: atrial fibrillation  Patient Measurements: Height: '5\' 4"'$  (162.6 cm) Weight: 106.3 kg (234 lb 5.6 oz) IBW/kg (Calculated) : 54.7 Heparin Dosing Weight: 79.8 kg  Labs: Recent Labs    10/03/2022 1602 10/05/2022 1707 10/05/22 0306 10/05/22 1419 10/06/22 0215 10/06/22 0418  HGB 8.6*  --  9.0*  --  9.2*  --   HCT 30.7*  --  32.5*  --  32.4*  --   PLT 258  --  290  --  307  --   APTT  --   --   --   --  87* 93*  LABPROT  --   --   --  18.0* 20.8*  --   INR  --   --   --  1.5* 1.8*  --   HEPARINUNFRC  --   --   --   --  0.27*  --   CREATININE 3.83*  --  3.87*  --  3.59*  --   TROPONINIHS 51* 49*  --   --   --   --     Estimated Creatinine Clearance: 21 mL/min (A) (by C-G formula based on SCr of 3.59 mg/dL (H)).  Medications:  Heparin Dosing Weight: 79.8 kg PTA: Xarelto 20 QHS (last dose on 11/03 PTA) Inpatient: [facility] 5k Q8h  >> heparin gtt   Assessment: 56yo F w/ h/o drug and alcohol abuse, HTN, HLD, MDD and AFib, who was admitted to [facility] on 10/14/2022 for severe sepsis with acute organ dysfunction. Pharmacy consulted to transition of prophylactic heparin to infusion. Pt xarelto for Afib PTA is on hold ISO severe AKI until renal fxn normalizes.   Date Time aPTT/HL Rate/Comment 11/7 0215 87  / 0.27 Thera?; 1250u/hr 11/7 1219 112 / 0.32 Thera; 1250u/hr       Baseline Labs: Hgb - 9.0; Plts - 290  Goal of Therapy:  Heparin level 0.3-0.7 units/ml aPTT 66-102 seconds Monitor platelets by anticoagulation protocol: Yes   Plan:  --aPTT and HL are not correlating; note HL is therapeutic and aPTT is supratherapeutic. There does not appear to be any significant impact of residual Xarelto on HL. Baseline aPTT was not collected so difficult to interpret. Suspect hepatic congestion issues contributing --Will continue heparin at 1250 units/hr --Will re-check aPTT and HL in 8 hours. Plan to follow HL primarily, but  want to check aPTT to ensure stabilization given trend --Daily CBC per protocol; monitor closely for bleeding given coagulopathy  Benita Gutter 10/06/2022,11:44 AM

## 2022-10-06 NOTE — Consult Note (Signed)
Lucilla Lame, MD [facility]  [address] [place], [place] [address] Phone: [phone] Fax : [phone]  Consultation Preconsult chart review Referring Provider:     Dr. Lanney Gins Primary Care Physician:  Emelia Loron, NP Primary Gastroenterologist:  Dr. Marius Ditch         Reason for Consultation:     Sepsis  Date of Admission:  10/27/2022 Date of Consultation:  10/06/2022         HPI:   Joice Nazario is a 56 y.o. female who was recently hospital for hyperbilirubinemia and right-sided abdominal pain.  The patient was seen by Dr. Haig Prophet and his PA.  Patient had been admitted with 1 month history of poor p.o. intake weakness and intractable nausea with abdominal pain.  The patient had a MRI on October 18 that showed:  IMPRESSION: 1. Biliary sludge lying dependently in the gallbladder. Gallbladder wall may be mildly edematous, but is poorly evaluated on today's motion limited examination. If there is any clinical concern for acute cholecystitis, further evaluation with right upper quadrant abdominal ultrasound or nuclear medicine hepatobiliary scan should be considered at this time. 2. No biliary tract dilatation to suggest obstruction. No evidence of choledocholithiasis. 3. Severe cardiomegaly.  The patient was now admitted with sepsis and had a ultrasound that showed:  IMPRESSION: 1. Small amount of gallbladder sludge. No gallstones. Mild gallbladder wall thickening. The differential diagnosis is hydrops due to poor right heart function versus acalculous cholecystitis. 2. Hepatic steatosis. Trace amount of ascites. 3. To and fro flow directionality in the portal vein. This constellation of findings is consistent with chronic hepatic parenchymal disease.  The patient was also noted to have a history of cocaine use with history of alcohol abuse depression A-fib on Xarelto with neuropathy.  Patient was admitted with circulatory shock possibly septic shock  as per the ICU.  The etiology was thought to be a UTI.  The patient remains on pressors.  The patient denies any abdominal pain at the present time.  She is answering questions  Past Medical History:  Diagnosis Date   Acute drug-induced gout of left foot 03/01/2018   Last Assessment & Plan:  Likely at least partially brought on by diuresis.  Needs to continue to diurese  Will push hydration Stop allopurinol given initiation during acute flare may worsen this, re-broach this when asymptomatic Avoid nsaids given stomach pain Trial colchicine Add acetaminophen Ice, elevate, rest   Allergy    seasonal   Anxiety    Arthritis    Right Knee   Asthma    CHF (congestive heart failure) (HCC)    COPD (chronic obstructive pulmonary disease) (HCC)    Coronary artery disease    Leaky heart valve   Diabetes mellitus without complication (HCC)    Dysrhythmia    Fibromyalgia    GERD (gastroesophageal reflux disease)    Hypertension    Pneumonia    PUD (peptic ulcer disease)    Pulmonary HTN (HCC)    Rheumatic fever/heart disease    Sleep apnea     Past Surgical History:  Procedure Laterality Date   ADENOIDECTOMY     COLONOSCOPY WITH PROPOFOL N/A 11/06/2019   Procedure: COLONOSCOPY WITH PROPOFOL;  Surgeon: Virgel Manifold, MD;  Location: [facility] ENDOSCOPY;  Service: Endoscopy;  Laterality: N/A;  2 day prep    COLONOSCOPY WITH PROPOFOL N/A 06/29/2022   Procedure: COLONOSCOPY WITH PROPOFOL;  Surgeon: Lin Landsman, MD;  Location: [place];  Service: Gastroenterology;  Laterality: N/A;   CORONARY ARTERY BYPASS GRAFT     June 27 2018   ESOPHAGOGASTRODUODENOSCOPY (EGD) WITH PROPOFOL N/A 05/25/2018   Procedure: ESOPHAGOGASTRODUODENOSCOPY (EGD) WITH PROPOFOL;  Surgeon: Lucilla Lame, MD;  Location: [facility] ENDOSCOPY;  Service: Endoscopy;  Laterality: N/A;   ESOPHAGOGASTRODUODENOSCOPY (EGD) WITH PROPOFOL N/A 11/06/2019   Procedure: ESOPHAGOGASTRODUODENOSCOPY (EGD) WITH PROPOFOL;  Surgeon:  Virgel Manifold, MD;  Location: [facility] ENDOSCOPY;  Service: Endoscopy;  Laterality: N/A;   ESOPHAGOGASTRODUODENOSCOPY (EGD) WITH PROPOFOL N/A 06/29/2022   Procedure: ESOPHAGOGASTRODUODENOSCOPY (EGD) WITH PROPOFOL;  Surgeon: Lin Landsman, MD;  Location: [facility] ENDOSCOPY;  Service: Gastroenterology;  Laterality: N/A;   GIVENS CAPSULE STUDY N/A 07/22/2022   Procedure: GIVENS CAPSULE STUDY;  Surgeon: Lin Landsman, MD;  Location: [facility] ENDOSCOPY;  Service: Gastroenterology;  Laterality: N/A;   MITRAL VALVE REPLACEMENT     MULTIPLE TOOTH EXTRACTIONS     TONSILLECTOMY     TOTAL KNEE ARTHROPLASTY Right 09/04/2016   Procedure: TOTAL KNEE ARTHROPLASTY; with lateral release;  Surgeon: Earlie Server, MD;  Location: [place];  Service: Orthopedics;  Laterality: Right;    Prior to Admission medications   Medication Sig Start Date End Date Taking? Authorizing Provider  albuterol (VENTOLIN HFA) 108 (90 Base) MCG/ACT inhaler Inhale 1-2 puffs into the lungs every 6 (six) hours as needed for wheezing or shortness of breath.    [provider]  allopurinol (ZYLOPRIM) 100 MG tablet Take 1 tablet (100 mg total) by mouth 2 (two) times daily. 03/05/20   Milinda Pointer, MD  Cholecalciferol (VITAMIN D) 50 MCG (2000 UT) CAPS Take 1 capsule (2,000 Units total) by mouth daily. 06/19/21   Alisa Graff, FNP  colchicine 0.6 MG tablet Take 1 tablet (0.6 mg total) by mouth daily. As directed 03/05/20   Milinda Pointer, MD  diazepam (VALIUM) 10 MG tablet Take 10 mg by mouth 2 (two) times daily as needed for anxiety. 07/16/22   [provider]  docusate sodium (COLACE) 100 MG capsule Take 100 mg by mouth 2 (two) times daily as needed for mild constipation or moderate constipation.    [provider]  empagliflozin (JARDIANCE) 10 MG TABS tablet Take 10 mg by mouth daily.    [provider]  furosemide (LASIX) 20 MG tablet Take 3 tablets (60 mg total) by mouth daily. 10/02/22 11/01/22   Wyvonnia Dusky, MD  gabapentin (NEURONTIN) 600 MG tablet Take 0.5 tablets (300 mg total) by mouth 2 (two) times daily. Patient taking differently: Take 600 mg by mouth 3 (three) times daily. 12/06/18   Vaughan Basta, MD  ipratropium-albuterol (DUONEB) 0.5-2.5 (3) MG/3ML SOLN INHALE THE CONTENTS OF 1 VIAL(3MLS) VIA NEBULIZER 3 TIMES DAILY AS NEEDED 10/24/19   Carles Collet M, PA-C  Iron-FA-B Cmp-C-Biot-Probiotic (FUSION PLUS) CAPS Take 1 capsule by mouth daily. Patient not taking: Reported on 09/16/2022 06/25/22   Lin Landsman, MD  lactulose (CHRONULAC) 10 GM/15ML solution Take 45 mLs (30 g total) by mouth 2 (two) times daily. 10/01/22 10/31/22  Wyvonnia Dusky, MD  metolazone (ZAROXOLYN) 2.5 MG tablet Take 1 tablet (2.5 mg total) by mouth every Monday, Wednesday, and Friday. 10/02/22 11/01/22  Wyvonnia Dusky, MD  metoprolol tartrate (LOPRESSOR) 50 MG tablet Take 1 tablet (50 mg total) by mouth 2 (two) times daily. 10/01/22 10/31/22  Wyvonnia Dusky, MD  nitroGLYCERIN (NITROSTAT) 0.4 MG SL tablet Place 1 tablet (0.4 mg total) under the tongue every 5 (five) minutes as needed for chest pain. 06/19/21  Darylene Price A, FNP  omeprazole (PRILOSEC) 40 MG capsule Take 40 mg by mouth daily.    [provider]  oxyCODONE ER 9 MG C12A Take 9 mg by mouth 2 (two) times daily.    [provider]  potassium chloride (KLOR-CON) 10 MEQ tablet Take 4 tablets (40 mEq total) by mouth 2 (two) times daily. And additional 34mq on metolazone days Patient taking differently: Take 40 mEq by mouth See admin instructions. Take 4 tablets (418m) by mouth twice daily - take an additional 2 tablets (2070m by mouth when taking metolazone 01/05/22   HacAlisa GraffNP  promethazine (PHENERGAN) 12.5 MG tablet Take 1 tablet (12.5 mg total) by mouth every 8 (eight) hours as needed for nausea or vomiting. Patient taking differently: Take 12.5 mg by mouth 2 (two) times daily as needed for  nausea or vomiting. 02/20/20   Bacigalupo, AngDionne BucyD  rifaximin (XIFAXAN) 550 MG TABS tablet Take 1 tablet (550 mg total) by mouth 2 (two) times daily. 10/01/22 10/31/22  WilWyvonnia DuskyD  rivaroxaban (XARELTO) 20 MG TABS tablet Take 20 mg by mouth daily with supper.    [provider]  rosuvastatin (CRESTOR) 10 MG tablet Take 10 mg by mouth daily.    [provider]  spironolactone (ALDACTONE) 25 MG tablet Take 0.5 tablets (12.5 mg total) by mouth daily. 10/02/22 11/01/22  WilWyvonnia DuskyD    Family History  Problem Relation Age of Onset   COPD Mother    Cancer Mother        Bone   Asthma Mother    Rheum arthritis Mother    Congestive Heart Failure Father    Breast cancer Maternal Aunt      Social History   Tobacco Use   Smoking status: Every Day    Packs/day: 0.10    Types: Cigarettes   Smokeless tobacco: Never   Tobacco comments:    4/20 was not able to quit.  Still at 1-3 a day. She would like to wait to set a new quit date until we get back to class to have the extra in person support  Vaping Use   Vaping Use: Never used  Substance Use Topics   Alcohol use: Never    Alcohol/week: 3.0 standard drinks of alcohol    Types: 3 Cans of beer per week    Comment: 16 oz per week   Drug use: Not Currently    Comment: Previous use of cocaine and marijuana last use 07/31/16     Allergies as of 10/12/2022 - Review Complete 10/14/2022  Allergen Reaction Noted   Amiodarone Nausea And Vomiting 07/17/2018   Aspirin Swelling 06/08/2015   Flexeril [cyclobenzaprine] Swelling 07/05/2015   Trazamine [trazodone & diet manage prod] Nausea And Vomiting 08/19/2016   Codeine Rash 06/08/2015   Tramadol Rash 06/08/2015    Review of Systems:    All systems reviewed and negative except where noted in HPI.   Physical Exam:  Vital signs in last 24 hours: Temp:  [97.5 F (36.4 C)-98.3 F (36.8 C)] 97.5 F (36.4 C) (11/07 0400) Pulse Rate:  [25-190] 116 (11/07  1030) Resp:  [8-31] 17 (11/07 1030) BP: (74-134)/(11-105) 89/52 (11/07 0900) SpO2:  [6 %-100 %] 97 % (11/07 1030) Arterial Line BP: (84-121)/(56-87) 109/69 (11/07 1030) Weight:  [106.3 kg] 106.3 kg (11/06 1700) Last BM Date : 10/06/22 General:   Pleasant, cooperative in NAD Head:  Normocephalic and atraumatic. Eyes:   No icterus.  Conjunctiva pink. PERRLA. Ears:  Normal auditory acuity. Neck:  Supple; no masses or thyroidomegaly Lungs: Respirations even and unlabored. Lungs clear to auscultation bilaterally.   No wheezes, crackles, or rhonchi.  Heart: Regular rate and rhythm;  Without murmur, clicks, rubs or gallops Abdomen:  Soft, nondistended, nontender. Normal bowel sounds. No appreciable masses or hepatomegaly.  No rebound or guarding.  Rectal:  Not performed. Msk:  Symmetrical without gross deformities.   Extremities: Positive pitting edema, cyanosis or clubbing. Neurologic:  Alert and oriented x3;  grossly normal neurologically. Skin:  Intact without significant lesions or rashes. Cervical Nodes:  No significant cervical adenopathy. Psych:  Alert and cooperative. Normal affect.  LAB RESULTS: Recent Labs    10/20/2022 1602 10/05/22 0306 10/06/22 0215  WBC 12.0* 12.7* 13.5*  HGB 8.6* 9.0* 9.2*  HCT 30.7* 32.5* 32.4*  PLT 258 290 307   BMET Recent Labs    10/06/2022 1602 10/05/22 0306 10/06/22 0215  NA 137 137 136  K 5.5* 5.2* 4.6  CL 90* 93* 95*  CO2 34* 32 30  GLUCOSE 64* 93 101*  BUN 50* 53* 51*  CREATININE 3.83* 3.87* 3.59*  CALCIUM 9.3 9.0 8.5*   LFT Recent Labs    10/01/2022 1602 10/05/22 0306 10/06/22 0215  PROT 6.9   < > 7.0  ALBUMIN 2.6*   < > 2.7*  AST 70*   < > 70*  ALT 36   < > 40  ALKPHOS 93   < > 91  BILITOT 9.0*   < > 9.4*  BILIDIR 4.7*  --   --   IBILI 4.3*  --   --    < > = values in this interval not displayed.   PT/INR Recent Labs    10/05/22 1419 10/06/22 0215  LABPROT 18.0* 20.8*  INR 1.5* 1.8*    STUDIES: DG Chest Port 1  View  Result Date: 10/05/2022 CLINICAL DATA:  Evaluate central venous catheter placement EXAM: PORTABLE CHEST 1 VIEW COMPARISON:  10/21/2022 FINDINGS: Signs of previous median sternotomy. 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 There is a right IJ catheter with tip in the projection of the SVC. No pneumothorax identified. Cardiac enlargement is unchanged. Unchanged appearance of diffuse reticular interstitial opacities throughout both lungs. No sign of pleural effusion. IMPRESSION: Right IJ catheter with tip in the projection of the SVC. No pneumothorax. No change in diffuse increase interstitial opacities which may reflect interstitial edema, atypical infection or chronic lung disease. Electronically Signed   By: Kerby Moors M.D.   On: 10/05/2022 14:14   US Abdomen Limited RUQ (LIVER/GB)  Result Date: 10/05/2022 CLINICAL DATA:  Hyperbilirubinemia EXAM: ULTRASOUND ABDOMEN LIMITED RIGHT UPPER QUADRANT COMPARISON:  MRI and ultrasound October of 2023. FINDINGS: Gallbladder: No gallstones. No Murphy sign. Small amount of gallbladder sludge. Mild gallbladder wall thickening, 4.3 mm. The differential diagnosis is hydrops due to poor right heart function versus acalculous cholecystitis peer Common bile duct: Diameter: 4.2 mm.  No visible stone. Liver: No focal lesion. Increased echogenicity suggesting steatosis. Trace amount of ascites. Portal vein is patent on color Doppler imaging with to and fro flow directionality. Other: None. IMPRESSION: 1. Small amount of gallbladder sludge. No gallstones. Mild gallbladder wall thickening. The differential diagnosis is hydrops due to poor right heart function versus acalculous cholecystitis. 2. Hepatic steatosis. Trace amount of ascites. 3. To and fro flow directionality in the portal vein. This constellation of findings is consistent with chronic hepatic parenchymal disease. Electronically Signed  By: Nelson Chimes M.D.   On: 10/05/2022 09:24   CT HEAD WO  CONTRAST (5MM)  Result Date: 10/23/2022 CLINICAL DATA:  Mental status change EXAM: CT HEAD WITHOUT CONTRAST TECHNIQUE: Contiguous axial images were obtained from the base of the skull through the vertex without intravenous contrast. RADIATION DOSE REDUCTION: This exam was performed according to the departmental dose-optimization program which includes automated exposure control, adjustment of the mA and/or kV according to patient size and/or use of iterative reconstruction technique. COMPARISON:  MRI brain February 01, 2022 and head CT January 31, 2020 FINDINGS: Brain: Tiny remote left cerebellar infarct. No evidence of acute infarction, hemorrhage, hydrocephalus, extra-axial collection or mass lesion/mass effect. Vascular: No hyperdense vessel or unexpected calcification. Skull: Similar appearance of the left inferior orbital wall fracture with herniation of fat through the fracture defect. Negative for acute fracture or focal lesion. Sinuses/Orbits: Visualized portions of the paranasal sinuses predominantly clear. Other: None IMPRESSION: 1. No acute intracranial abnormality. 2. Tiny remote left cerebellar infarct. 3. Similar appearance of the left inferior orbital wall fracture with herniation of fat through the fracture defect. Electronically Signed   By: Dahlia Bailiff M.D.   On: 10/22/2022 19:14   DG Chest Port 1 View  Result Date: 10/17/2022 CLINICAL DATA:  Shortness of breath. EXAM: PORTABLE CHEST 1 VIEW COMPARISON:  09/25/2022 FINDINGS: Stable enlarged cardiac silhouette, median sternotomy wires, prosthetic mitral valve and left atrial clip. Stable prominence of the interstitial markings with no Kerley lines. Possible small left pleural effusion. Thoracic spine degenerative changes. IMPRESSION: 1. Stable cardiomegaly and chronic interstitial lung disease with possible superimposed interstitial edema. 2. Possible small left pleural effusion. Electronically Signed   By: Claudie Revering M.D.   On: 10/24/2022 16:06       Impression / Plan:   Assessment: Principal Problem:   Severe sepsis with acute organ dysfunction (HCC) Active Problems:   A-fib (HCC)   HTN (hypertension)   Chronic anticoagulation (XARELTO)   GAD (generalized anxiety disorder)   History of substance abuse ([name])   Insomnia   Acute renal failure ([place])   Altered mental status   Hypotension   Hepatic steatosis   Mellissa Marianny Goris is a 56 y.o. y/o female with septic shock and hypotension on pressors with sludge found in the gallbladder.  Has been question of a calculus cholecystitis.  The common bile duct is normal in size and the patient is not in need of any ERCP since this will not fix the gallbladder issues.  The patient had this sustained hyperbilirubinemia and an elevated AST despite the patient reporting that she no longer drinks alcohol.  It is unlikely that her hyperbilirubinemia is the cause of her sepsis and a calculus cholecystitis may be a more likely cause versus a extraintestinal/GI cause. I recommend surgery seeing the patient due to the abnormal imaging of the gallbladder and possible consideration for a cholecystostomy. The patient's hyperbilirubinemia was fractionated and showed that about half of it is direct while the other half is indirect.   Plan:  The patient's imaging showed the patient to have biliary sludge with gallbladder wall thickening.  The patient should be seen by surgery and decide whether they want to do a HIDA scan to see if the patient has acute cholecystitis.  The patient's chronic liver disease and reversal of flow of the portal vein are likely caused by the patient's history of drinking and her present history of cardiac issues causing congestion with the findings of fatty liver.  Nothing other than the gallbladder points to any GI cause for her sepsis and hypotension.  Thank you for involving me in the care of this patient.      LOS: 2 days   Lucilla Lame, MD, [facility] 10/06/2022, 11:40  AM,  Pager [phone] 7am-5pm  Check AMION for 5pm -7am coverage and on weekends   Note: This dictation was prepared with Dragon dictation along with smaller phrase technology. Any transcriptional errors that result from this process are unintentional.

## 2022-10-06 NOTE — Progress Notes (Addendum)
[facility]  ROUNDING NOTE   Subjective:   Patient seen sitting up in bed in ICU Alert Patient experienced shortness of breath, now on 10L [place] Generalized edema Little to no urine output, bladder scans  Levo and Vaso in place Heparin drip in place   Objective:  Vital signs in last 24 hours:  Temp:  [97.5 F (36.4 C)-98.3 F (36.8 C)] 97.5 F (36.4 C) (11/07 0400) Pulse Rate:  [25-190] 116 (11/07 1030) Resp:  [8-31] 17 (11/07 1030) BP: (74-134)/(11-105) 89/52 (11/07 0900) SpO2:  [6 %-100 %] 97 % (11/07 1030) Arterial Line BP: (84-121)/(56-87) 109/69 (11/07 1030) Weight:  [106.3 kg] 106.3 kg (11/06 1700)  Weight change:  Filed Weights   10/05/22 1700  Weight: 106.3 kg    Intake/Output: I/O last 3 completed shifts: In: 5513.6 [P.O.:50; I.V.:2713.6; IV Piggyback:2749.9] Out: 250 [Urine:150; Stool:100]   Intake/Output this shift:  No intake/output data recorded.  Physical Exam: General: NAD  Head: Normocephalic, atraumatic. Moist oral mucosal membranes  Eyes: Anicteric  Lungs:  Rt lower base diminished, Left basilar crackles,  O2  Heart: Irregular rate and rhythm  Abdomen:  Soft, nontender  Extremities:  1+ peripheral edema.  Neurologic: Nonfocal, moving all four extremities  Skin: No lesions  Access: None    Basic Metabolic Panel: Recent Labs  Lab 09/30/22 0719 10/01/22 0627 10/01/2022 1602 10/05/22 0306 10/06/22 0215  NA 138 138 137 137 136  K 3.5 4.4 5.5* 5.2* 4.6  CL 89* 89* 90* 93* 95*  CO2 38* 37* 34* 32 30  GLUCOSE 105* 78 64* 93 101*  BUN 33* 36* 50* 53* 51*  CREATININE 1.13* 1.28* 3.83* 3.87* 3.59*  CALCIUM 8.9 9.1 9.3 9.0 8.5*  MG 1.7  --  2.2  --  1.9  PHOS  --   --   --   --  5.5*    Liver Function Tests: Recent Labs  Lab 09/30/22 0719 10/01/22 0627 10/21/2022 1602 10/05/22 0306 10/06/22 0215  AST 61* 63* 70* 73* 70*  ALT 30 34 36 40 40  ALKPHOS 94 89 93 91 91  BILITOT 7.7* 7.8* 9.0* 9.4* 9.4*  PROT 6.6 6.7 6.9  7.1 7.0  ALBUMIN 2.5* 2.4* 2.6* 2.8* 2.7*   No results for input(s): "LIPASE", "AMYLASE" in the last 168 hours. Recent Labs  Lab 10/05/22 0306  AMMONIA 67*    CBC: Recent Labs  Lab 09/30/22 1049 10/01/22 0627 10/08/2022 1602 10/05/22 0306 10/06/22 0215  WBC 8.1 7.6 12.0* 12.7* 13.5*  NEUTROABS  --   --  9.4*  --   --   HGB 9.3* 9.1* 8.6* 9.0* 9.2*  HCT 32.8* 31.8* 30.7* 32.5* 32.4*  MCV 84.8 85.3 88.5 88.8 86.9  PLT 143* 132* 258 290 307    Cardiac Enzymes: No results for input(s): "CKTOTAL", "CKMB", "CKMBINDEX", "TROPONINI" in the last 168 hours.  BNP: Invalid input(s): "POCBNP"  CBG: Recent Labs  Lab 10/05/22 0815 10/05/22 1549 10/05/22 2008 10/06/22 0022 10/06/22 0732  GLUCAP 96 82 98 98 94    Microbiology: Results for orders placed or performed during the hospital encounter of 10/29/2022  Resp Panel by RT-PCR (Flu A&B, Covid) Anterior Nasal Swab     Status: None   Collection Time: 10/03/2022  3:44 PM   Specimen: Anterior Nasal Swab  Result Value Ref Range Status   SARS Coronavirus 2 by RT PCR NEGATIVE NEGATIVE Final    Comment: (NOTE) SARS-CoV-2 target nucleic acids are NOT DETECTED.  The SARS-CoV-2 RNA is  generally detectable in upper respiratory specimens during the acute phase of infection. The lowest concentration of SARS-CoV-2 viral copies this assay can detect is 138 copies/mL. A negative result does not preclude SARS-Cov-2 infection and should not be used as the sole basis for treatment or other patient management decisions. A negative result may occur with  improper specimen collection/handling, submission of specimen other than nasopharyngeal swab, presence of viral mutation(s) within the areas targeted by this assay, and inadequate number of viral copies(<138 copies/mL). A negative result must be combined with clinical observations, patient history, and epidemiological information. The expected result is Negative.  Fact Sheet for Patients:   EntrepreneurPulse.com.au  Fact Sheet for Healthcare Providers:  IncredibleEmployment.be  This test is no t yet approved or cleared by the [place] FDA and  has been authorized for detection and/or diagnosis of SARS-CoV-2 by FDA under an Emergency Use Authorization (EUA). This EUA will remain  in effect (meaning this test can be used) for the duration of the COVID-19 declaration under Section 564(b)(1) of the Act, 21 U.S.C.section 360bbb-3(b)(1), unless the authorization is terminated  or revoked sooner.       Influenza A by PCR NEGATIVE NEGATIVE Final   Influenza B by PCR NEGATIVE NEGATIVE Final    Comment: (NOTE) The Xpert Xpress SARS-CoV-2/FLU/RSV plus assay is intended as an aid in the diagnosis of influenza from Nasopharyngeal swab specimens and should not be used as a sole basis for treatment. Nasal washings and aspirates are unacceptable for Xpert Xpress SARS-CoV-2/FLU/RSV testing.  Fact Sheet for Patients: EntrepreneurPulse.com.au  Fact Sheet for Healthcare Providers: IncredibleEmployment.be  This test is not yet approved or cleared by the [place] FDA and has been authorized for detection and/or diagnosis of SARS-CoV-2 by FDA under an Emergency Use Authorization (EUA). This EUA will remain in effect (meaning this test can be used) for the duration of the COVID-19 declaration under Section 564(b)(1) of the Act, 21 U.S.C. section 360bbb-3(b)(1), unless the authorization is terminated or revoked.  Performed at [facility], [address], [place], [place] [address]   Urine Culture     Status: Abnormal (Preliminary result)   Collection Time: 10/03/2022  4:41 PM   Specimen: Urine, Clean Catch  Result Value Ref Range Status   Specimen Description   Final    URINE, CLEAN CATCH Performed at [facility], [address], [place], [place] [address]    Special Requests    Final    NONE Performed at [facility], [address] [place], [place] [address]    Culture >=100,000 COLONIES/mL GRAM NEGATIVE RODS (A)  Final   Report Status PENDING  Incomplete  Culture, blood (routine x 2)     Status: None (Preliminary result)   Collection Time: 10/03/2022  5:08 PM   Specimen: BLOOD LEFT HAND  Result Value Ref Range Status   Specimen Description BLOOD LEFT HAND  Final   Special Requests   Final    BOTTLES DRAWN AEROBIC ONLY Blood Culture results may not be optimal due to an inadequate volume of blood received in culture bottles   Culture   Final    NO GROWTH 2 DAYS Performed at [facility], [address] [place], [place] [address]    Report Status PENDING  Incomplete  Culture, blood (routine x 2)     Status: None (Preliminary result)   Collection Time: 10/08/2022  5:42 PM   Specimen: BLOOD LEFT HAND  Result Value Ref Range Status  Specimen Description BLOOD LEFT HAND  Final   Special Requests   Final    BOTTLES DRAWN AEROBIC AND ANAEROBIC Blood Culture adequate volume   Culture   Final    NO GROWTH 2 DAYS Performed at [facility], [address] [place], [place] [address]    Report Status PENDING  Incomplete  MRSA Next Gen by PCR, Nasal     Status: None   Collection Time: 10/05/22  9:12 AM   Specimen: Nasal Mucosa; Nasal Swab  Result Value Ref Range Status   MRSA by PCR Next Gen NOT DETECTED NOT DETECTED Final    Comment: (NOTE) The GeneXpert MRSA Assay (FDA approved for NASAL specimens only), is one component of a comprehensive MRSA colonization surveillance program. It is not intended to diagnose MRSA infection nor to guide or monitor treatment for MRSA infections. Test performance is not FDA approved in patients less than 34 years old. Performed at [facility], [address] [place], [place] [address]     Coagulation Studies: Recent Labs    10/05/22 1419 10/06/22 0215  LABPROT 18.0* 20.8*  INR  1.5* 1.8*    Urinalysis: Recent Labs    10/18/2022 [place] 1.024  PHURINE TEST NOT REPORTED DUE TO COLOR INTERFERENCE OF URINE PIGMENT  GLUCOSEU TEST NOT REPORTED DUE TO COLOR INTERFERENCE OF URINE PIGMENT*  HGBUR TEST NOT REPORTED DUE TO COLOR INTERFERENCE OF URINE PIGMENT*  BILIRUBINUR TEST NOT REPORTED DUE TO COLOR INTERFERENCE OF URINE PIGMENT*  KETONESUR TEST NOT REPORTED DUE TO COLOR INTERFERENCE OF URINE PIGMENT*  PROTEINUR TEST NOT REPORTED DUE TO COLOR INTERFERENCE OF URINE PIGMENT*  NITRITE TEST NOT REPORTED DUE TO COLOR INTERFERENCE OF URINE PIGMENT*  LEUKOCYTESUR TEST NOT REPORTED DUE TO COLOR INTERFERENCE OF URINE PIGMENT*      Imaging: DG Chest Port 1 View  Result Date: 10/05/2022 CLINICAL DATA:  Evaluate central venous catheter placement EXAM: PORTABLE CHEST 1 VIEW COMPARISON:  10/25/2022 FINDINGS: Signs of previous median sternotomy. 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 There is a right IJ catheter with tip in the projection of the SVC. No pneumothorax identified. Cardiac enlargement is unchanged. Unchanged appearance of diffuse reticular interstitial opacities throughout both lungs. No sign of pleural effusion. IMPRESSION: Right IJ catheter with tip in the projection of the SVC. No pneumothorax. No change in diffuse increase interstitial opacities which may reflect interstitial edema, atypical infection or chronic lung disease. Electronically Signed   By: Kerby Moors M.D.   On: 10/05/2022 14:14   US Abdomen Limited RUQ (LIVER/GB)  Result Date: 10/05/2022 CLINICAL DATA:  Hyperbilirubinemia EXAM: ULTRASOUND ABDOMEN LIMITED RIGHT UPPER QUADRANT COMPARISON:  MRI and ultrasound October of 2023. FINDINGS: Gallbladder: No gallstones. No Murphy sign. Small amount of gallbladder sludge. Mild gallbladder wall thickening, 4.3 mm. The differential diagnosis is hydrops due to poor right heart function versus acalculous cholecystitis peer  Common bile duct: Diameter: 4.2 mm.  No visible stone. Liver: No focal lesion. Increased echogenicity suggesting steatosis. Trace amount of ascites. Portal vein is patent on color Doppler imaging with to and fro flow directionality. Other: None. IMPRESSION: 1. Small amount of gallbladder sludge. No gallstones. Mild gallbladder wall thickening. The differential diagnosis is hydrops due to poor right heart function versus acalculous cholecystitis. 2. Hepatic steatosis. Trace amount of ascites. 3. To and fro flow directionality in the portal vein. This constellation of findings is consistent with chronic hepatic parenchymal disease. Electronically Signed   By: Elta Guadeloupe  Shogry M.D.   On: 10/05/2022 09:24   CT HEAD WO CONTRAST (5MM)  Result Date: 10/13/2022 CLINICAL DATA:  Mental status change EXAM: CT HEAD WITHOUT CONTRAST TECHNIQUE: Contiguous axial images were obtained from the base of the skull through the vertex without intravenous contrast. RADIATION DOSE REDUCTION: This exam was performed according to the departmental dose-optimization program which includes automated exposure control, adjustment of the mA and/or kV according to patient size and/or use of iterative reconstruction technique. COMPARISON:  MRI brain February 01, 2022 and head CT January 31, 2020 FINDINGS: Brain: Tiny remote left cerebellar infarct. No evidence of acute infarction, hemorrhage, hydrocephalus, extra-axial collection or mass lesion/mass effect. Vascular: No hyperdense vessel or unexpected calcification. Skull: Similar appearance of the left inferior orbital wall fracture with herniation of fat through the fracture defect. Negative for acute fracture or focal lesion. Sinuses/Orbits: Visualized portions of the paranasal sinuses predominantly clear. Other: None IMPRESSION: 1. No acute intracranial abnormality. 2. Tiny remote left cerebellar infarct. 3. Similar appearance of the left inferior orbital wall fracture with herniation of fat through  the fracture defect. Electronically Signed   By: Dahlia Bailiff M.D.   On: 10/02/2022 19:14   DG Chest Port 1 View  Result Date: 10/02/2022 CLINICAL DATA:  Shortness of breath. EXAM: PORTABLE CHEST 1 VIEW COMPARISON:  09/25/2022 FINDINGS: Stable enlarged cardiac silhouette, median sternotomy wires, prosthetic mitral valve and left atrial clip. Stable prominence of the interstitial markings with no Kerley lines. Possible small left pleural effusion. Thoracic spine degenerative changes. IMPRESSION: 1. Stable cardiomegaly and chronic interstitial lung disease with possible superimposed interstitial edema. 2. Possible small left pleural effusion. Electronically Signed   By: Claudie Revering M.D.   On: 10/26/2022 16:06     Medications:    sodium chloride     furosemide (LASIX) 200 mg in dextrose 5 % 100 mL (2 mg/mL) infusion     heparin 1,250 Units/hr (10/06/22 0600)   norepinephrine (LEVOPHED) Adult infusion 7 mcg/min (10/06/22 0600)   piperacillin-tazobactam (ZOSYN)  IV     vasopressin 0.03 Units/min (10/06/22 0622)    Chlorhexidine Gluconate Cloth  6 each Topical Daily   lactulose  30 g Oral BID   pantoprazole  80 mg Oral Daily   rifaximin  550 mg Oral BID   rosuvastatin  10 mg Oral Daily   acetaminophen **OR** acetaminophen, docusate sodium, fentaNYL (SUBLIMAZE) injection, hydrALAZINE, LORazepam, melatonin, nitroGLYCERIN, oxyCODONE-acetaminophen, senna-docusate  Assessment/ Plan:  [name] is a 56 y.o.  female with past medical conditions including drug and alcohol abuse, hypertension, hyperlipidemia, depression and atrial fib, who was admitted to [facility] on 10/11/2022 for Generalized weakness [R53.1] AKI (acute kidney injury) ([place]) [N17.9] Severe sepsis with acute organ dysfunction ([place]) [A41.9, R65.20] Acute on chronic respiratory failure with hypoxia (HCC) [J96.21] Sepsis, due to unspecified organism, unspecified whether acute organ dysfunction present ([place])  [A41.9]   Acute kidney injury with hyperkalemia likely secondary to cardiorenal syndrome and possible UTI, urine culture pending. Normal renal function in July 2023. Creatinine on admission , 3.83.   Renal function slightly improved today, however patient remains oliguric. Bladder scans show low volume. We feel, due to commodities and recent decline of health, patient may not be a good candidate long term dialysis. Recommend palliative care for goals of care.   Lab Results  Component Value Date   CREATININE 3.59 (H) 10/06/2022   CREATININE 3.87 (H) 10/05/2022   CREATININE 3.83 (H) 10/23/2022    Intake/Output Summary (Last 24 hours) at  10/06/2022 1052 Last data filed at 10/06/2022 0600 [place] per 24 hour  Intake 2551.08 ml  Output 250 ml  Net 2301.08 ml   2. Anemia, cause unknown at this time. Hgb 9.2   3. Hypotension likely secondary to sepsis.Placed on Levo and vaso.  4. Chronic systolic heart failure. ECHO on 09/23/22 shows EF 35-40%, right ventricular enlarged with severe tricuspid regurgitation and aortic calcification.  Will order furosemide drip at '4mg'$ /hr to manage fluid.    LOS: 2 [place] 11/7/202310:52 AM

## 2022-10-06 NOTE — Consult Note (Signed)
Palliative Care Consult Note                                  Date: 10/06/2022   Patient Name: Chelsea Ramsey  DOB: 06-Mar-1966  MRN: [id number]  Age / Sex: 56 y.o., female  PCP: Emelia Loron, NP Referring Physician: Ottie Glazier, MD  Reason for Consultation: Establishing goals of care  HPI/Patient Profile: 56 y.o. female  with past medical history of morbid obesity, history of drug use including cocaine and THC, history of EtOH abuse, neuropathy, depression, atrial fibrillation on Xarelto, hyperlipidemia, hypertension, who presented on 10/13/2022 for evaluation or progressively worsening shortness of breath.   Noted recent admissions for similar presentation, discharge most recently on 11/2.  Prior admission records reviewed.  Patient has had hyperbilirubinemia secondary to congestive hepatopathy.  Prior admissions, GI and hematology/oncology were consulted and evaluation was unrevealing.  Possible acalculous cholecystitis was in the differential, but patient poor candidate for surgery or procedures given poor cardiac function and pulmonary hypertension, morbid obesity also a complicating factor.  PCCM assumed care to the circulatory shock requiring vasopressors.  She was admitted on 10/21/2022 with severe sepsis with acute organ dysfunction, altered mental status, A-fib on chronic anticoagulation, hypertension, history of substance abuse, circulatory shock, hepatic steatosis, and others.   PMT was consulted for [place] conversations.  Past Medical History:  Diagnosis Date   Acute drug-induced gout of left foot 03/01/2018   Last Assessment & Plan:  Likely at least partially brought on by diuresis.  Needs to continue to diurese  Will push hydration Stop allopurinol given initiation during acute flare may worsen this, re-broach this when asymptomatic Avoid nsaids given stomach pain Trial colchicine Add acetaminophen Ice, elevate, rest    Allergy    seasonal   Anxiety    Arthritis    Right Knee   Asthma    CHF (congestive heart failure) (HCC)    COPD (chronic obstructive pulmonary disease) (HCC)    Coronary artery disease    Leaky heart valve   Diabetes mellitus without complication (HCC)    Dysrhythmia    Fibromyalgia    GERD (gastroesophageal reflux disease)    Hypertension    Pneumonia    PUD (peptic ulcer disease)    Pulmonary HTN (HCC)    Rheumatic fever/heart disease    Sleep apnea     Subjective:   This NP Walden Field reviewed medical records, received report from team, assessed the patient and then meet at the patient's bedside to discuss diagnosis, prognosis, GOC, EOL wishes disposition and options.  I met with the patient at the bedside.  No family was present.   Concept of Palliative Care was introduced as specialized medical care for people and their families living with serious illness.  If focuses on providing relief from the symptoms and stress of a serious illness.  The goal is to improve quality of life for both the patient and the family. Values and goals of care important to patient and family were attempted to be elicited.  Created space and opportunity for patient  and family to explore thoughts and feelings regarding current medical situation   Natural trajectory and current clinical status were discussed. Questions and concerns addressed. Patient  encouraged to call with questions or concerns.    Patient/Family Understanding of Illness: She understands that she is very sick.  However, she does not know many specifics.  She knows  her heart is bad and her liver has problems.  Life Review: Deferred  Patient Values: Deferred  Goals: Mom to be surrogate decision maker  Today's Discussion: In addition to the discussions we had additional discussions on various topics.  The patient is awake, alert, oriented.  However, she does not fully understand her current medical situation.  During her  last admission it was clear that she wanted her mother to be her surrogate decision-maker.  There is a "spouse" listed on her facesheet Elwin Sleight").  However, during her last admission it was unable to be confirmed whether they are legally married.  I again attempted to confirm this and the only response she would give me as "it is complicated".  Per nursing staff the patient's mother said that Elwin Sleight is not her husband legally.  However, during this admission patient is clear that she wants her mother to be her surrogate decision-maker as outlined in previous notes.  Attempted to call patient's mother unsuccessful.  I provided emotional and general support through therapeutic listening, empathy, sharing of stories, and other techniques. I answered all questions and addressed all concerns to the best of my ability.  Review of Systems  Constitutional:  Positive for fatigue.       Minimal to no pain verbalized  Respiratory:  Negative for shortness of breath.   Gastrointestinal:  Negative for nausea and vomiting.    Objective:   Primary Diagnoses: Present on Admission:  Severe sepsis with acute organ dysfunction (HCC)  Insomnia  HTN (hypertension)  GAD (generalized anxiety disorder)  A-fib (HCC)  Acute renal failure ([place])  History of substance abuse ([place])   Physical Exam Vitals and nursing note reviewed.  Constitutional:      General: She is sleeping. She is not in acute distress.    Appearance: She is ill-appearing.  HENT:     Head: Normocephalic and atraumatic.  Cardiovascular:     Rate and Rhythm: Tachycardia present. Rhythm irregular.  Pulmonary:     Effort: Pulmonary effort is normal. No respiratory distress.     Comments: Noted HFNC Abdominal:     General: Bowel sounds are normal.     Palpations: Abdomen is soft.  Skin:    General: Skin is warm and dry.  Neurological:     Mental Status: She is easily aroused.  Psychiatric:        Mood and Affect: Mood normal.         Behavior: Behavior normal.     Vital Signs:  BP 113/89 (BP Location: Left Arm)   Pulse (!) 136   Temp 97.8 F (36.6 C) (Axillary)   Resp 17   Ht _0  (1.626 m)   Wt 106.3 kg   LMP 11/08/2009 Comment: menopause  SpO2 99%   BMI 40.23 kg/m   Palliative Assessment/Data: 20-30%    Advanced Care Planning:   Existing Vynca/ACP Documentation: [place] document completed 09/23/22  Primary Decision Maker: PATIENT and mother  Code Status/Advance Care Planning: DNR  A discussion was had today regarding advanced directives. Concepts specific to code status, artifical feeding and hydration, continued IV antibiotics and rehospitalization was had.  The difference between a aggressive medical intervention path and a palliative comfort care path for this patient at this time was had.   Decisions/Changes to ACP: None today  Assessment & Plan:   Impression: 56 year old female with multiple chronic comorbidities and acute presentations described above.  She is currently quite ill requiring pressors including Levophed and vasopressin.  She is on high flow nasal cannula for oxygen Support.  She remains in A-fib with RVR.  The patient seems to have an element of capacity today.  She states she wants to make her decisions now but if she cannot make her decision she would want her mother to make her decisions, not the "spouse versus significant other".  Overall prognosis poor given multisystem organ failure.  SUMMARY OF RECOMMENDATIONS   Remain DNR Spiritual care consult for HCPOA completion Ongoing [place] discussions with patient and her mother as clinical picture evolves PMT will follow  Symptom Management:  Per primary team PMT is available to assist as needed  Prognosis:  Unable to determine  Discharge Planning:  To Be Determined   Discussed with: Patient, medical team, nursing team, [facility] team, chaplain    Thank you for allowing [place] to participate in the care of [name] PMT will continue to support holistically.  Billing based on MDM: High  Problems Addressed: One acute or chronic illness or injury that poses a threat to life or bodily function  Amount and/or Complexity of Data: Category 1:Review of prior external note(s) from each unique source and Review of the result(s) of each unique test and Category 3:Discussion of management or test interpretation with external physician/other qualified health care professional/appropriate source (not separately reported) (outpatient cardiology and GI notes; current labs today including met panel, CBC, CoOx, procalcitonin, INR, ABG)  Risks: Decision not to resuscitate or to de-escalate care because of poor prognosis  Signed by: Walden Field, NP Palliative Medicine Team  Team Phone # [phone] (Nights/Weekends)  10/06/2022, 2:13 PM

## 2022-10-06 NOTE — Consult Note (Signed)
ANTICOAGULATION CONSULT NOTE - Follow Up Consult  Pharmacy Consult for heparin gtt Indication: atrial fibrillation  Allergies  Allergen Reactions   Amiodarone Nausea And Vomiting   Aspirin Swelling   Flexeril [Cyclobenzaprine] Swelling   Trazamine [Trazodone & Diet Manage Prod] Nausea And Vomiting   Codeine Rash   Tramadol Rash    Patient Measurements: Height: '5\' 4"'$  (162.6 cm) Weight: 106.3 kg (234 lb 5.6 oz) IBW/kg (Calculated) : 54.7 Heparin Dosing Weight: 79.8 kg  Vital Signs: Temp: 98.3 F (36.8 C) (11/06 2000) Temp Source: Oral (11/06 2000) BP: 87/78 (11/07 0205) Pulse Rate: 102 (11/07 0235)  Labs: Recent Labs    10/18/2022 1602 10/20/2022 1707 10/05/22 0306 10/05/22 1419 10/06/22 0215  HGB 8.6*  --  9.0*  --  9.2*  HCT 30.7*  --  32.5*  --  32.4*  PLT 258  --  290  --  307  APTT  --   --   --   --  87*  LABPROT  --   --   --  18.0* 20.8*  INR  --   --   --  1.5* 1.8*  HEPARINUNFRC  --   --   --   --  0.27*  CREATININE 3.83*  --  3.87*  --  3.59*  TROPONINIHS 51* 49*  --   --   --     Estimated Creatinine Clearance: 21 mL/min (A) (by C-G formula based on SCr of 3.59 mg/dL (H)).  Medications:  Heparin Dosing Weight: 79.8 kg PTA: Xarelto 20 QHS (last dose on 11/03 PTA) Inpatient: [facility] 5k Q8h  >> heparin gtt   Assessment: 56yo F w/ h/o drug and alcohol abuse, HTN, HLD, MDD and AFib, who was admitted to [facility] on 10/06/2022 for severe sepsis with acute organ dysfunction. Pharmacy consulted to transition of prophylactic heparin to infusion. Pt xarelto for Afib PTA is on hold ISO severe AKI until renal fxn normalizes.   Date Time aPTT/HL Rate/Comment       Baseline Labs: aPTT - unk; INR - unk Hgb - 9.0; Plts - 290  Goal of Therapy:  Heparin level 0.3-0.7 units/ml aPTT 66-102 seconds Monitor platelets by anticoagulation protocol: Yes   Plan:  Last dose of Xarelto '20mg'$  on 11/03 PM PTA, but pt with significant AKI/new organ dysfunction so anti-xa lab  interference may persist beyond typical half-life.  11/7 @ 0215:  aPTT = 87,  HL = 90.27 - aPTT therapeutic X 1 , HL subtherapeutic Will continue pt on current rate and draw confirmation aPTT on 11/7 @ 1000 , will recheck HL on 11/8 @ 0500.   Check aPTT/Anti-Xa level in 8 hours and daily once consecutively therapeutic.  Titrate by aPTT's until lab correlation is noted, then titrate by anti-xa alone. Continue to monitor H&H and platelets daily while on heparin gtt.  Shresta Risden D 10/06/2022,3:06 AM

## 2022-10-06 NOTE — Progress Notes (Signed)
PHARMACY NOTE:  ANTIMICROBIAL RENAL DOSAGE ADJUSTMENT  Current antimicrobial regimen includes a mismatch between antimicrobial dosage and estimated renal function.  As per policy approved by the Pharmacy & Therapeutics and Medical Executive Committees, the antimicrobial dosage will be adjusted accordingly.  Current antimicrobial dosage: Zosyn 2.25 g IV q8h (30-minute infusion)  Indication: Sepsis  Renal Function:  Estimated Creatinine Clearance: 21 mL/min (A) (by C-G formula based on SCr of 3.59 mg/dL (H)).  Antimicrobial dosage has been changed to:  Zosyn 3.375 g IV q8h (4-hr infusion)  Thank you for allowing pharmacy to be a part of this patient's care.  Benita Gutter, [facility] 10/06/2022 9:08 AM

## 2022-10-06 NOTE — Progress Notes (Signed)
[facility] NOTE       Patient ID: Chelsea Ramsey MRN: [id number] DOB/AGE: November 03, 1966 56 y.o.  Admit date: 10/01/2022 Referring Physician Darel Hong, NP  Primary Physician Dr. Gracy Racer Primary Cardiologist Dr. 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  She was admitted to stepdown and is being treated for septic vs cardiogenic shock with suspected urinary source, confounded by acute on chronic heart failure.  Cardiology is consulted for further assistance.  Interval History:   -on levo and vasopressin. Bps by A line are 110/60s. In AF RVR 110s-120s.  -Mentating better today, able to tell me she feels better in general.  Denies missing medications when she was at home after discharge.  She is "not quite sure what happened" to bring her back to the hospital. -Feels short of breath, primarily complaining of back pain.  No chest pain or heart racing that she can feel. -Renal function remains poor, discussed with nephrology regarding starting diuresis, they will order a Lasix infusion  Review of systems complete and found to be negative unless listed above     Past Medical History:  Diagnosis Date   Acute drug-induced gout of left foot 03/01/2018   Last Assessment & Plan:  Likely at least partially brought on by diuresis.  Needs to continue to diurese  Will push hydration Stop allopurinol given initiation during acute flare may worsen this, re-broach this when asymptomatic Avoid  nsaids given stomach pain Trial colchicine Add acetaminophen Ice, elevate, rest   Allergy    seasonal   Anxiety    Arthritis    Right Knee   Asthma    CHF (congestive heart failure) (HCC)    COPD (chronic obstructive pulmonary disease) (HCC)    Coronary artery disease    Leaky heart valve   Diabetes mellitus without complication (HCC)    Dysrhythmia    Fibromyalgia    GERD (gastroesophageal reflux disease)    Hypertension    Pneumonia    PUD (peptic ulcer disease)    Pulmonary HTN (HCC)    Rheumatic fever/heart disease    Sleep apnea     Past Surgical History:  Procedure Laterality Date   ADENOIDECTOMY     COLONOSCOPY WITH PROPOFOL N/A 11/06/2019   Procedure: COLONOSCOPY WITH PROPOFOL;  Surgeon: Virgel Manifold, MD;  Location: [facility] ENDOSCOPY;  Service: Endoscopy;  Laterality: N/A;  2 day prep    COLONOSCOPY WITH PROPOFOL N/A 06/29/2022   Procedure: COLONOSCOPY WITH PROPOFOL;  Surgeon: Lin Landsman, MD;  Location: [facility] ENDOSCOPY;  Service: Gastroenterology;  Laterality: N/A;   CORONARY ARTERY BYPASS GRAFT     June 27 2018   ESOPHAGOGASTRODUODENOSCOPY (EGD) WITH PROPOFOL N/A 05/25/2018   Procedure: ESOPHAGOGASTRODUODENOSCOPY (EGD) WITH PROPOFOL;  Surgeon: Lucilla Lame, MD;  Location: [facility] ENDOSCOPY;  Service: Endoscopy;  Laterality: N/A;   ESOPHAGOGASTRODUODENOSCOPY (EGD) WITH PROPOFOL N/A 11/06/2019   Procedure: ESOPHAGOGASTRODUODENOSCOPY (EGD) WITH PROPOFOL;  Surgeon: Virgel Manifold, MD;  Location: [facility] ENDOSCOPY;  Service: Endoscopy;  Laterality: N/A;   ESOPHAGOGASTRODUODENOSCOPY (EGD) WITH  PROPOFOL N/A 06/29/2022   Procedure: ESOPHAGOGASTRODUODENOSCOPY (EGD) WITH PROPOFOL;  Surgeon: Lin Landsman, MD;  Location: [facility] ENDOSCOPY;  Service: Gastroenterology;  Laterality: N/A;   GIVENS CAPSULE STUDY N/A 07/22/2022   Procedure: GIVENS CAPSULE STUDY;  Surgeon: Lin Landsman, MD;  Location: [facility] ENDOSCOPY;  Service: Gastroenterology;  Laterality: N/A;    MITRAL VALVE REPLACEMENT     MULTIPLE TOOTH EXTRACTIONS     TONSILLECTOMY     TOTAL KNEE ARTHROPLASTY Right 09/04/2016   Procedure: TOTAL KNEE ARTHROPLASTY; with lateral release;  Surgeon: Earlie Server, MD;  Location: [place];  Service: Orthopedics;  Laterality: Right;    Medications Prior to Admission  Medication Sig Dispense Refill Last Dose   albuterol (VENTOLIN HFA) 108 (90 Base) MCG/ACT inhaler Inhale 1-2 puffs into the lungs every 6 (six) hours as needed for wheezing or shortness of breath.   prn   allopurinol (ZYLOPRIM) 100 MG tablet Take 1 tablet (100 mg total) by mouth 2 (two) times daily. 60 tablet 5 10/02/2022   Cholecalciferol (VITAMIN D) 50 MCG (2000 UT) CAPS Take 1 capsule (2,000 Units total) by mouth daily. 30 capsule 5 10/02/2022   colchicine 0.6 MG tablet Take 1 tablet (0.6 mg total) by mouth daily. As directed 30 tablet 5 prn   diazepam (VALIUM) 10 MG tablet Take 10 mg by mouth 2 (two) times daily as needed for anxiety.   prn   docusate sodium (COLACE) 100 MG capsule Take 100 mg by mouth 2 (two) times daily as needed for mild constipation or moderate constipation.   prn   empagliflozin (JARDIANCE) 10 MG TABS tablet Take 10 mg by mouth daily.   10/02/2022   furosemide (LASIX) 20 MG tablet Take 3 tablets (60 mg total) by mouth daily. 90 tablet 0 10/02/2022   gabapentin (NEURONTIN) 600 MG tablet Take 0.5 tablets (300 mg total) by mouth 2 (two) times daily. (Patient taking differently: Take 600 mg by mouth 3 (three) times daily.) 60 tablet 0 prn   ipratropium-albuterol (DUONEB) 0.5-2.5 (3) MG/3ML SOLN INHALE THE CONTENTS OF 1 VIAL(3MLS) VIA NEBULIZER 3 TIMES DAILY AS NEEDED 360 mL 1 prn   Iron-FA-B Cmp-C-Biot-Probiotic (FUSION PLUS) CAPS Take 1 capsule by mouth daily. (Patient not taking: Reported on 09/16/2022) 30 capsule 3    lactulose (CHRONULAC) 10 GM/15ML solution Take 45 mLs (30 g total) by mouth 2 (two) times daily. 2700 mL 0 10/02/2022   metolazone (ZAROXOLYN) 2.5 MG tablet Take 1  tablet (2.5 mg total) by mouth every Monday, Wednesday, and Friday. 12 tablet 0 10/02/2022   metoprolol tartrate (LOPRESSOR) 50 MG tablet Take 1 tablet (50 mg total) by mouth 2 (two) times daily. 60 tablet 0 10/02/2022   nitroGLYCERIN (NITROSTAT) 0.4 MG SL tablet Place 1 tablet (0.4 mg total) under the tongue every 5 (five) minutes as needed for chest pain. 30 tablet 12 prn   omeprazole (PRILOSEC) 40 MG capsule Take 40 mg by mouth daily.   10/02/2022   oxyCODONE ER 9 MG C12A Take 9 mg by mouth 2 (two) times daily.   10/02/2022   potassium chloride (KLOR-CON) 10 MEQ tablet Take 4 tablets (40 mEq total) by mouth 2 (two) times daily. And additional 69mq on metolazone days (Patient taking differently: Take 40 mEq by mouth See admin instructions. Take 4 tablets (462m) by mouth twice daily - take an additional 2 tablets (2072m by mouth when taking metolazone) 64 tablet 5 10/02/2022   promethazine (PHENERGAN) 12.5 MG tablet Take 1 tablet (12.5 mg total)  by mouth every 8 (eight) hours as needed for nausea or vomiting. (Patient taking differently: Take 12.5 mg by mouth 2 (two) times daily as needed for nausea or vomiting.) 30 tablet 0 prn   rifaximin (XIFAXAN) 550 MG TABS tablet Take 1 tablet (550 mg total) by mouth 2 (two) times daily. 60 tablet 0 10/02/2022   rivaroxaban (XARELTO) 20 MG TABS tablet Take 20 mg by mouth daily with supper.   10/02/2022   rosuvastatin (CRESTOR) 10 MG tablet Take 10 mg by mouth daily.   10/02/2022   spironolactone (ALDACTONE) 25 MG tablet Take 0.5 tablets (12.5 mg total) by mouth daily. 15 tablet 0 10/02/2022   Social History   Socioeconomic History   Marital status: Married    Spouse name: Not on file   Number of children: Not on file   Years of education: Not on file   Highest education level: Not on file  Occupational History   Occupation: disabled  Tobacco Use   Smoking status: Every Day    Packs/day: 0.10    Types: Cigarettes   Smokeless tobacco: Never   Tobacco  comments:    4/20 was not able to quit.  Still at 1-3 a day. She would like to wait to set a new quit date until we get back to class to have the extra in person support  Vaping Use   Vaping Use: Never used  Substance and Sexual Activity   Alcohol use: Never    Alcohol/week: 3.0 standard drinks of alcohol    Types: 3 Cans of beer per week    Comment: 16 oz per week   Drug use: Not Currently    Comment: Previous use of cocaine and marijuana last use 07/31/16    Sexual activity: Not Currently  Other Topics Concern   Not on file  Social History Narrative   Not on file   Social Determinants of Health   Financial Resource Strain: Low Risk  (10/02/2019)   Overall Financial Resource Strain (CARDIA)    Difficulty of Paying Living Expenses: Not hard at all  Food Insecurity: No Food Insecurity (09/18/2022)   Hunger Vital Sign    Worried About Running Out of Food in the Last Year: Never true    Ran Out of Food in the Last Year: Never true  Transportation Needs: No Transportation Needs (09/18/2022)   PRAPARE - Hydrologist (Medical): No    Lack of Transportation (Non-Medical): No  Physical Activity: Insufficiently Active (10/02/2019)   Exercise Vital Sign    Days of Exercise per Week: 7 days    Minutes of Exercise per Session: 20 min  Stress: No Stress Concern Present (10/02/2019)   [place]    Feeling of Stress : Not at all  Social Connections: Moderately Integrated (10/02/2019)   Social Connection and Isolation Panel [NHANES]    Frequency of Communication with Friends and Family: More than three times a week    Frequency of Social Gatherings with Friends and Family: More than three times a week    Attends Religious Services: 1 to 4 times per year    Active Member of [employer] or Organizations: Yes    Attends Archivist Meetings: 1 to 4 times per year    Marital Status: Never married   Intimate Partner Violence: Not At Risk (09/18/2022)   Humiliation, Afraid, Rape, and Kick questionnaire    Fear of Current or Ex-Partner: No  Emotionally Abused: No    Physically Abused: No    Sexually Abused: No    Family History  Problem Relation Age of Onset   COPD Mother    Cancer Mother        Bone   Asthma Mother    Rheum arthritis Mother    Congestive Heart Failure Father    Breast cancer Maternal Aunt      Vitals:   10/06/22 0930 10/06/22 0945 10/06/22 1000 10/06/22 1015  BP:      Pulse:  (!) 114 (!) 120 (!) 114  Resp: '14 14 17 18  '$ Temp:      TempSrc:      SpO2:  99% 94% 100%  Weight:      Height:        PHYSICAL EXAM General: Acute on chronically ill-appearing black female, in no acute distress.  Sitting upright in ICU bed, no family at bedside. HEENT:  Normocephalic and atraumatic. Neck:  + JVD.  Lungs: Normal respiratory effort on 3 L by nasal cannula.  Decreased breath sounds bilaterally with coarseness anteriorly, noncooperative with exam Heart: Tachycardic irregularly irregular rhythm. Normal S1 and S2 without gallops.  2/6 systolic murmur best heard at the RUSB.  Abdomen: Non-distended appearing with excess adiposity and a large pannus.  Msk: Generalized weakness Extremities: Warm and well perfused. No clubbing, cyanosis.  2+ bilateral lower extremity edema, feet cool to touch.  Neuro: Lethargic, nods off during interview Psych: Difficult to assess, often repeats statements rather than answers questions that are asked  Labs: Basic Metabolic Panel: Recent Labs    10/07/2022 1602 10/05/22 0306 10/06/22 0215  NA 137 137 136  K 5.5* 5.2* 4.6  CL 90* 93* 95*  CO2 34* 32 30  GLUCOSE 64* 93 101*  BUN 50* 53* 51*  CREATININE 3.83* 3.87* 3.59*  CALCIUM 9.3 9.0 8.5*  MG 2.2  --  1.9  PHOS  --   --  5.5*    Liver Function Tests: Recent Labs    10/05/22 0306 10/06/22 0215  AST 73* 70*  ALT 40 40  ALKPHOS 91 91  BILITOT 9.4* 9.4*  PROT 7.1 7.0   ALBUMIN 2.8* 2.7*    No results for input(s): "LIPASE", "AMYLASE" in the last 72 hours. CBC: Recent Labs    09/30/2022 1602 10/05/22 0306 10/06/22 0215  WBC 12.0* 12.7* 13.5*  NEUTROABS 9.4*  --   --   HGB 8.6* 9.0* 9.2*  HCT 30.7* 32.5* 32.4*  MCV 88.5 88.8 86.9  PLT 258 290 307    Cardiac Enzymes: Recent Labs    10/03/2022 1602 09/30/2022 1707  TROPONINIHS 51* 49*    BNP: Recent Labs    10/21/2022 1602  BNP 785.2*    D-Dimer: No results for input(s): "DDIMER" in the last 72 hours. Hemoglobin A1C: No results for input(s): "HGBA1C" in the last 72 hours. Fasting Lipid Panel: No results for input(s): "CHOL", "HDL", "LDLCALC", "TRIG", "CHOLHDL", "LDLDIRECT" in the last 72 hours. Thyroid Function Tests: Recent Labs    10/05/22 0306  TSH 8.916*    Anemia Panel: Recent Labs    10/05/22 0306  VITAMINB12 1,380*      Radiology: [facility] Chest Port 1 View  Result Date: 10/05/2022 CLINICAL DATA:  Evaluate central venous catheter placement EXAM: PORTABLE CHEST 1 VIEW COMPARISON:  10/11/2022 FINDINGS: Signs of previous median sternotomy. 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 There is a right IJ catheter with tip in the projection of the SVC.  No pneumothorax identified. Cardiac enlargement is unchanged. Unchanged appearance of diffuse reticular interstitial opacities throughout both lungs. No sign of pleural effusion. IMPRESSION: Right IJ catheter with tip in the projection of the SVC. No pneumothorax. No change in diffuse increase interstitial opacities which may reflect interstitial edema, atypical infection or chronic lung disease. Electronically Signed   By: Kerby Moors M.D.   On: 10/05/2022 14:14   US Abdomen Limited RUQ (LIVER/GB)  Result Date: 10/05/2022 CLINICAL DATA:  Hyperbilirubinemia EXAM: ULTRASOUND ABDOMEN LIMITED RIGHT UPPER QUADRANT COMPARISON:  MRI and ultrasound October of 2023. FINDINGS: Gallbladder: No gallstones. No Murphy sign. Small amount  of gallbladder sludge. Mild gallbladder wall thickening, 4.3 mm. The differential diagnosis is hydrops due to poor right heart function versus acalculous cholecystitis peer Common bile duct: Diameter: 4.2 mm.  No visible stone. Liver: No focal lesion. Increased echogenicity suggesting steatosis. Trace amount of ascites. Portal vein is patent on color Doppler imaging with to and fro flow directionality. Other: None. IMPRESSION: 1. Small amount of gallbladder sludge. No gallstones. Mild gallbladder wall thickening. The differential diagnosis is hydrops due to poor right heart function versus acalculous cholecystitis. 2. Hepatic steatosis. Trace amount of ascites. 3. To and fro flow directionality in the portal vein. This constellation of findings is consistent with chronic hepatic parenchymal disease. Electronically Signed   By: Nelson Chimes M.D.   On: 10/05/2022 09:24   CT HEAD WO CONTRAST (5MM)  Result Date: 10/25/2022 CLINICAL DATA:  Mental status change EXAM: CT HEAD WITHOUT CONTRAST TECHNIQUE: Contiguous axial images were obtained from the base of the skull through the vertex without intravenous contrast. RADIATION DOSE REDUCTION: This exam was performed according to the departmental dose-optimization program which includes automated exposure control, adjustment of the mA and/or kV according to patient size and/or use of iterative reconstruction technique. COMPARISON:  MRI brain February 01, 2022 and head CT January 31, 2020 FINDINGS: Brain: Tiny remote left cerebellar infarct. No evidence of acute infarction, hemorrhage, hydrocephalus, extra-axial collection or mass lesion/mass effect. Vascular: No hyperdense vessel or unexpected calcification. Skull: Similar appearance of the left inferior orbital wall fracture with herniation of fat through the fracture defect. Negative for acute fracture or focal lesion. Sinuses/Orbits: Visualized portions of the paranasal sinuses predominantly clear. Other: None IMPRESSION: 1.  No acute intracranial abnormality. 2. Tiny remote left cerebellar infarct. 3. Similar appearance of the left inferior orbital wall fracture with herniation of fat through the fracture defect. Electronically Signed   By: Dahlia Bailiff M.D.   On: 10/22/2022 19:14   DG Chest Port 1 View  Result Date: 10/22/2022 CLINICAL DATA:  Shortness of breath. EXAM: PORTABLE CHEST 1 VIEW COMPARISON:  09/25/2022 FINDINGS: Stable enlarged cardiac silhouette, median sternotomy wires, prosthetic mitral valve and left atrial clip. Stable prominence of the interstitial markings with no Kerley lines. Possible small left pleural effusion. Thoracic spine degenerative changes. IMPRESSION: 1. Stable cardiomegaly and chronic interstitial lung disease with possible superimposed interstitial edema. 2. Possible small left pleural effusion. Electronically Signed   By: Claudie Revering M.D.   On: 10/27/2022 16:06   DG Chest Port 1 View  Result Date: 09/25/2022 CLINICAL DATA:  Shortness of breath, renal failure. EXAM: PORTABLE CHEST 1 VIEW COMPARISON:  Chest radiograph dated 09/16/2022. FINDINGS: The heart is enlarged. Mild-to-moderate bilateral interstitial and airspace opacities are redemonstrated. There is no pleural effusion or pneumothorax. Degenerative changes are seen in the spine. A mitral valve prosthesis and atrial appendage clip, and median sternotomy wires are redemonstrated. IMPRESSION: Cardiomegaly  with mild-to-moderate bilateral interstitial and airspace opacities, likely pulmonary edema. Electronically Signed   By: Zerita Boers M.D.   On: 09/25/2022 10:23   ECHOCARDIOGRAM LIMITED  Result Date: 09/23/2022    ECHOCARDIOGRAM LIMITED REPORT   Patient Name:   Chelsea Ramsey Date of Exam: 09/23/2022 Medical Rec #:  [id number]                 Height:       64.0 in Accession #:    [id number]                Weight:       243.4 lb Date of Birth:  Apr 26, 1966                 BSA:          2.127 m Patient Age:    28 years                   BP:           109/71 mmHg Patient Gender: F                         HR:           96 bpm. Exam Location:  [facility] Procedure: Limited Echo Indications:     CHF I50.9  History:         Patient has prior history of Echocardiogram examinations, most                  recent 09/17/2022. CHF, Prior Cardiac Surgery, COPD; Risk                  Factors:Hypertension.                   Mitral Valve: bioprosthetic valve valve is present in the                  mitral position.  Sonographer:     Sherrie Sport Referring Phys:  [id number] [place] Dylyn Mclaren Diagnosing Phys: Serafina Royals MD  Sonographer Comments: Technically difficult study due to poor echo windows, suboptimal apical window and suboptimal parasternal window. IMPRESSIONS  1. Left ventricular ejection fraction, by estimation, is 35 to 40%. The left ventricle has moderately decreased function. The left ventricle demonstrates global hypokinesis. The left ventricular internal cavity size was mildly dilated.  2. There is LV septal flattenting in systole and diastole and dilated hepatic vein consistant with severe phtn. Right ventricular systolic function is moderately reduced. The right ventricular size is moderately enlarged. Mildly increased right ventricular wall thickness. There is severely elevated pulmonary artery systolic pressure.  3. Left atrial size was severely dilated.  4. Right atrial size was severely dilated.  5. Cannot rule out mild stenosis. doppler difficult to interpret. The mitral valve is degenerative. Mild mitral valve regurgitation. 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  6. The tricuspid valve is abnormal. Tricuspid valve regurgitation is severe.  7. The aortic valve is calcified. Aortic valve regurgitation is moderate. FINDINGS  Left Ventricle: Left ventricular ejection fraction, by estimation, is 35 to 40%. The left ventricle has moderately decreased function. The left ventricle demonstrates global hypokinesis. The  left ventricular internal cavity size was mildly dilated. Right Ventricle: There is LV septal flattenting in systole and diastole and dilated hepatic vein consistant with severe phtn. The right ventricular size is moderately enlarged. Mildly increased right ventricular  wall thickness. Right ventricular systolic  function is moderately reduced. There is severely elevated pulmonary artery systolic pressure. Left Atrium: Left atrial size was severely dilated. Right Atrium: Right atrial size was severely dilated. Pericardium: There is no evidence of pericardial effusion. Mitral Valve: Cannot rule out mild stenosis. doppler difficult to interpret. The mitral valve is degenerative in appearance. Mild mitral valve regurgitation. 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 MV peak gradient, 19.2 mmHg. The mean mitral valve gradient is 11.0 mmHg. Tricuspid Valve: The tricuspid valve is abnormal. Tricuspid valve regurgitation is severe. Aortic Valve: The aortic valve is calcified. Aortic valve regurgitation is moderate. Pulmonic Valve: The pulmonic valve was normal in structure. Pulmonic valve regurgitation is mild. Aorta: The aortic root and ascending aorta are structurally normal, with no evidence of dilitation. IAS/Shunts: No atrial level shunt detected by color flow Doppler. LEFT VENTRICLE PLAX 2D LVIDd:         4.70 cm LVIDs:         3.10 cm LV PW:         0.90 cm LV IVS:        0.90 cm  RIGHT VENTRICLE RV Basal diam:  5.00 cm RV Mid diam:    6.50 cm LEFT ATRIUM         Index LA diam:    6.50 cm 3.06 cm/m   AORTA Ao Root diam: 2.90 cm MITRAL VALVE                TRICUSPID VALVE MV Area (PHT): 2.63 cm     TR Peak grad:   53.0 mmHg MV Peak grad:  19.2 mmHg    TR Vmax:        364.00 cm/s MV Mean grad:  11.0 mmHg MV Vmax:       2.19 m/s MV Vmean:      161.0 cm/s MV Decel Time: 288 msec MV E velocity: 239.00 cm/s Serafina Royals MD Electronically signed by Serafina Royals MD Signature Date/Time:  09/23/2022/4:02:55 PM    Final    ECHOCARDIOGRAM COMPLETE  Result Date: 09/17/2022    ECHOCARDIOGRAM REPORT   Patient Name:   Chelsea Ramsey Date of Exam: 09/17/2022 Medical Rec #:  [id number]                 Height:       64.0 in Accession #:    [id number]                Weight:       265.0 lb Date of Birth:  02-16-1966                 BSA:          2.205 m Patient Age:    72 years                  BP:           93/79 mmHg Patient Gender: F                         HR:           110 bpm. Exam Location:  [facility] Procedure: 2D Echo, Color Doppler and Cardiac Doppler Indications:     I50.31 CHF-Acute Diastolic  History:         Patient has prior history of Echocardiogram examinations, most  recent 11/05/2018. CHF, CAD, COPD; Risk Factors:Hypertension,                  Diabetes and Sleep Apnea.  Sonographer:     Charmayne Sheer Referring Phys:  [id number] [place] Talicia Sui Diagnosing Phys: Yolonda Kida MD  Sonographer Comments: Suboptimal apical window and no subcostal window. Image acquisition challenging due to patient body habitus and Image acquisition challenging due to COPD. IMPRESSIONS  1. Left ventricular ejection fraction, by estimation, is 20 to 25%. The left ventricle has severely decreased function. The left ventricle demonstrates global hypokinesis. Left ventricular diastolic parameters are consistent with Grade III diastolic dysfunction (restrictive). There is the interventricular septum is flattened in systole and diastole, consistent with right ventricular pressure and volume overload.  2. Right ventricular systolic function is moderately reduced. The right ventricular size is moderately enlarged. Mildly increased right ventricular wall thickness.  3. Left atrial size was moderately dilated.  4. Right atrial size was severely dilated.  5. The mitral valve is abnormal. Mild to moderate mitral valve regurgitation.  6. Tricuspid valve regurgitation is severe.  7. The aortic valve is  calcified. Aortic valve regurgitation is mild to moderate. Aortic valve sclerosis/calcification is present, without any evidence of aortic stenosis. Conclusion(s)/Recommendation(s): Poor windows for evaluation of left ventricular function by transthoracic echocardiography. Would recommend an alternative means of evaluation. FINDINGS  Left Ventricle: Left ventricular ejection fraction, by estimation, is 20 to 25%. The left ventricle has severely decreased function. The left ventricle demonstrates global hypokinesis. The left ventricular internal cavity size was normal in size. There is borderline concentric left ventricular hypertrophy. The interventricular septum is flattened in systole and diastole, consistent with right ventricular pressure and volume overload. Left ventricular diastolic parameters are consistent with Grade III diastolic dysfunction (restrictive). Right Ventricle: The right ventricular size is moderately enlarged. Mildly increased right ventricular wall thickness. Right ventricular systolic function is moderately reduced. Left Atrium: Left atrial size was moderately dilated. Right Atrium: Right atrial size was severely dilated. Pericardium: There is no evidence of pericardial effusion. Mitral Valve: The mitral valve is abnormal. Mild to moderate mitral valve regurgitation. Tricuspid Valve: The tricuspid valve is grossly normal. Tricuspid valve regurgitation is severe. Aortic Valve: The aortic valve is calcified. Aortic valve regurgitation is mild to moderate. Aortic regurgitation PHT measures 233 msec. Aortic valve sclerosis/calcification is present, without any evidence of aortic stenosis. Aortic valve mean gradient measures 6.0 mmHg. Aortic valve peak gradient measures 11.3 mmHg. Aortic valve area, by VTI measures 1.14 cm. Pulmonic Valve: The pulmonic valve was normal in structure. Pulmonic valve regurgitation is mild to moderate. Aorta: The ascending aorta was not well visualized. IAS/Shunts:  No atrial level shunt detected by color flow Doppler.  LEFT VENTRICLE PLAX 2D LVIDd:         3.50 cm LVIDs:         3.20 cm LV PW:         1.20 cm LV IVS:        0.90 cm LVOT diam:     1.80 cm LV SV:         25 LV SV Index:   12 LVOT Area:     2.54 cm  RIGHT VENTRICLE RV Basal diam:  5.30 cm RV Mid diam:    5.00 cm RV S prime:     7.62 cm/s LEFT ATRIUM         Index       RIGHT ATRIUM  Index LA diam:    5.50 cm 2.49 cm/m  RA Area:     28.50 cm                                 RA Volume:   104.00 ml 47.17 ml/m  AORTIC VALVE                     PULMONIC VALVE AV Area (Vmax):    0.87 cm      PV Vmax:          0.98 m/s AV Area (Vmean):   0.90 cm      PV Vmean:         69.200 cm/s AV Area (VTI):     1.14 cm      PV VTI:           0.128 m AV Vmax:           168.00 cm/s   PV Peak grad:     3.9 mmHg AV Vmean:          116.000 cm/s  PV Mean grad:     2.5 mmHg AV VTI:            0.223 m       PR End Diast Vel: 10.24 msec AV Peak Grad:      11.3 mmHg AV Mean Grad:      6.0 mmHg LVOT Vmax:         57.60 cm/s LVOT Vmean:        40.900 cm/s LVOT VTI:          0.100 m LVOT/AV VTI ratio: 0.45 AI PHT:            233 msec  AORTA Ao Root diam: 3.10 cm MITRAL VALVE               TRICUSPID VALVE MV Area (PHT): 5.95 cm    TR Peak grad:   55.7 mmHg MV Decel Time: 128 msec    TR Vmax:        373.00 cm/s MV E velocity: 87.60 cm/s                            SHUNTS                            Systemic VTI:  0.10 m                            Systemic Diam: 1.80 cm Yolonda Kida MD Electronically signed by Yolonda Kida MD Signature Date/Time: 09/17/2022/4:54:34 PM    Final    DG Chest 1 View  Result Date: 09/16/2022 CLINICAL DATA:  Worsening shortness of breath. 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 Cardiomegaly which has increased from 2021. Slight globular configuration of the heart,  query pericardial effusion. Suspect small right pleural effusion. Chronic bilateral lung opacities that appears stable. No acute airspace disease or pneumothorax. On limited assessment, no acute osseous findings. IMPRESSION: 1. Increased cardiomegaly from 2021. Slight globular configuration of the heart, query pericardial effusion. 2. Suspect small right pleural effusion. 3. Stable chronic bilateral lung opacities. Electronically Signed  By: Keith Rake M.D.   On: 09/16/2022 17:21   MR ABDOMEN MRCP WO CONTRAST  Result Date: 09/16/2022 CLINICAL DATA:  56 year old female with history of jaundice. Nausea. EXAM: MRI ABDOMEN WITHOUT CONTRAST  (INCLUDING MRCP) TECHNIQUE: Multiplanar multisequence MR imaging of the abdomen was performed. Heavily T2-weighted images of the biliary and pancreatic ducts were obtained, and three-dimensional MRCP images were rendered by post processing. COMPARISON:  No prior abdominal MRI or MRCP. Abdominal ultrasound 09/15/2022. FINDINGS: Comment: Portions of today's examination are substantially limited by patient respiratory motion. The study is also severely limited for detection and characterization of visceral and/or vascular lesions by lack of IV gadolinium. Lower chest: Severe cardiomegaly. Massively distended inferior vena cava. Hepatobiliary: No definite suspicious cystic or solid hepatic lesions are confidently identified on today's motion limited noncontrast examination. No intra or extrahepatic biliary ductal dilatation noted on MRCP images. Common bile duct measures only 3 mm in the porta hepatis. No filling defect within the common bile duct to suggest choledocholithiasis. T1 hyperintense, T2 hypointense material lies dependently within the gallbladder. Gallbladder does not appear overly distended. Accurate assessment for gallbladder wall thickening is limited on today's examination secondary to motion, but there may be some very mild gallbladder wall thickening and  edema. No overt pericholecystic fluid or surrounding inflammatory changes. Pancreas: No definite pancreatic mass or peripancreatic fluid collections or inflammatory changes noted on today's noncontrast examination. No pancreatic ductal dilatation. Spleen:  Unremarkable. Adrenals/Urinary Tract: Unenhanced appearance of the kidneys and bilateral adrenal glands is normal. No hydroureteronephrosis in the visualized portions of the abdomen. Stomach/Bowel: Visualized portions are unremarkable. Vascular/Lymphatic: No aneurysm identified in the visualized abdominal vasculature. No lymphadenopathy noted in the abdomen. Other: No significant volume of ascites noted in the visualized portions of the peritoneal cavity. Musculoskeletal: No aggressive appearing osseous lesions are noted in the visualized portions of the skeleton. IMPRESSION: 1. Biliary sludge lying dependently in the gallbladder. Gallbladder wall may be mildly edematous, but is poorly evaluated on today's motion limited examination. If there is any clinical concern for acute cholecystitis, further evaluation with right upper quadrant abdominal ultrasound or nuclear medicine hepatobiliary scan should be considered at this time. 2. No biliary tract dilatation to suggest obstruction. No evidence of choledocholithiasis. 3. Severe cardiomegaly. Electronically Signed   By: Vinnie Langton M.D.   On: 09/16/2022 05:28   US ABDOMEN LIMITED RUQ (LIVER/GB)  Result Date: 09/15/2022 CLINICAL DATA:  Elevated bilirubin EXAM: ULTRASOUND ABDOMEN LIMITED RIGHT UPPER QUADRANT COMPARISON:  CT abdomen and pelvis 12/01/2018 FINDINGS: Gallbladder: Sludge is present within the gallbladder. Small amount of pericholecystic fluid. The gallbladder wall is mildly thickened measuring 5 mm. Sonographic Murphy's sign was positive. Common bile duct: Diameter: 3 mm Liver: No focal lesion identified. Increased echogenicity. Portal vein is patent on Doppler imaging with bidirectional flow.  Other: None. IMPRESSION: 1. Findings compatible with acute cholecystitis. 2. 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 Electronically Signed   By: Placido Sou M.D.   On: 09/15/2022 23:07    ECHO limited 09/23/22  1. Left ventricular ejection fraction, by estimation, is 35 to 40%. The left ventricle has moderately decreased function. The left ventricle demonstrates global hypokinesis. The left ventricular internal cavity size was mildly dilated.  2. There is LV septal flattenting in systole and diastole and dilated hepatic vein consistant with severe phtn. Right ventricular systolic function is moderately reduced. The right ventricular size is moderately enlarged. Mildly increased right ventricular wall thickness. There is  severely elevated pulmonary artery systolic pressure.  3. Left atrial size was severely dilated.  4. Right atrial size was severely dilated.  5. Cannot rule out mild stenosis. doppler difficult to interpret. The mitral valve is degenerative. Mild mitral valve regurgitation. 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  6. The tricuspid valve is abnormal. Tricuspid valve regurgitation is severe.  7. The aortic valve is calcified. Aortic valve regurgitation is moderate.    TELEMETRY reviewed by me (LT) 10/06/2022 : Atrial fibrillation rate 90s to low 100s  EKG reviewed by me: Atrial fibrillation rate 110  Data reviewed by me (LT) 10/06/2022: ED note, admission H&P, PCCM consult note, nursing notes, CBC, BMP, vitals, telemetry, troponins, BNP, I's and O's  Principal Problem:   Severe sepsis with acute organ dysfunction ([place]) Active Problems:   A-fib ([place])   HTN (hypertension)   Chronic anticoagulation ([place])   GAD (generalized anxiety disorder)   History of substance abuse ([place])   Insomnia   Acute renal failure ([place])   Altered mental status   Hypotension   Hepatic steatosis     ASSESSMENT AND PLAN:  Chelsea Ramsey "Chelsea Ramsey" is a 37yoF with a PMH of HFrEF (EF 35-40% G3 DD, global hypo), 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  She was admitted to stepdown and is being treated for septic vs cardiogenic shock with suspected urinary source, confounded by acute on chronic heart failure.  Cardiology is consulted for further assistance.  #Acute encephalopathy #Septic shock, suspected urinary source UA on admission significant for WBCs, RBCs and was brown in color, limiting evaluation of nitrites and leukocytes.  Culture pending. -Antibiotics narrowed from vancomycin to Zosyn, management per primary team -Recommend cautious use of IV fluids, anticipate need for diuresis -Wean vasopressor support as tolerated, unfortunately still requiring Levophed and vasopressin -Favor minimizing use of opioids and other sedating medicines if at all possible  #Acute on chronic hypoxic respiratory failure #Pulmonary hypertension Increased requirement overnight to 10 L by high flow  #Acute on chronic HFrEF (EF 35-40%, G3 DD, global hypokinesis) #Rheumatic MS s/p porcine MVR 2019 BNP elevated at 785 on admission, notably less than it was on 10/25 (1800).  Appears clinically volume overloaded on exam with peripheral edema, JVD, and interstitial edema on chest x-ray.  Net +5.7 L.  Minimal urine output so far. -Discussed with nephrology who is agreeable to start a low-dose of a Lasix infusion at 4 mg/h -Reinstate GDMT as her blood pressure allows with metoprolol and spironolactone -Diuretics on discharge 11/2: Lasix 60 mg once daily and metolazone 2.5 mg 3 times weekly in  addition to daily potassium supplementation.    #Paroxysmal atrial fibrillation on Xarelto In atrial fibrillation on telemetry, tachycardic in the 110s to 120s while on two vasopressors -Hold metoprolol while requiring vasopressor support -Hold Xarelto with AKI, recommend heparin until her renal function normalizes. -Notably, she has allergies to amiodarone, digoxin contraindicated in renal failure, sotalol is also not a great option as we start IV diuresis (electrolyte abnormalities anticipated)  #Acute renal failure on CKD 3 Renal function on admission severely decreased with BUN/creatinine 50/3.83 and GFR of 13.  Worsened with IV fluids.  Anticipate need  for diuresis. During her prior admission with a similar significant degree of AKI, her renal function essentially normalized after multiple days of IV diuresis. -Nephrology following, appreciate input.  #Hyperbilirubinemia #Hepatic steatosis T. bili remains elevated at 9.4 today.  Almost equal elevation of direct and indirect bilirubin, seen by surgery, GI and hematology during her last admission who felt this was related to underlying hepatic dysfunction from her right heart failure. No acute surgical need.  Her T. bili slowly improved after days of diuresis.  -Continue to monitor, INR is okay today at 1.5  #Elevated TSH TSH 8.9, free T4 within normal limits.  -Management per primary team  #Elevated troponin Minimally elevated with a flat trend at 51-49, in the absence of chest pain or EKG changes this is most consistent with demand/supply mismatch likely from her renal failure and not ACS   This patient's plan of care was discussed and created with Dr. Saralyn Pilar and he is in agreement.  Signed: Tristan Schroeder , PA-C 10/06/2022, 10:30 AM [name]

## 2022-10-06 NOTE — Consult Note (Signed)
ANTICOAGULATION CONSULT NOTE  Pharmacy Consult for heparin gtt Indication: atrial fibrillation  Patient Measurements: Height: '5\' 4"'$  (162.6 cm) Weight: 106.3 kg (234 lb 5.6 oz) IBW/kg (Calculated) : 54.7 Heparin Dosing Weight: 79.8 kg  Labs: Recent Labs    10/08/2022 1602 10/13/2022 1707 10/05/22 0306 10/05/22 1419 10/06/22 0215 10/06/22 0215 10/06/22 0418 10/06/22 1219 10/06/22 2050  HGB 8.6*  --  9.0*  --  9.2*  --   --   --   --   HCT 30.7*  --  32.5*  --  32.4*  --   --   --   --   PLT 258  --  290  --  307  --   --   --   --   APTT  --   --   --   --  87*   < > 93* 112* 74*  LABPROT  --   --   --  18.0* 20.8*  --   --   --   --   INR  --   --   --  1.5* 1.8*  --   --   --   --   HEPARINUNFRC  --   --   --   --  0.27*  --   --  0.32 0.14*  CREATININE 3.83*  --  3.87*  --  3.59*  --   --   --  3.29*  TROPONINIHS 51* 49*  --   --   --   --   --   --   --    < > = values in this interval not displayed.    Estimated Creatinine Clearance: 23 mL/min (A) (by C-G formula based on SCr of 3.29 mg/dL (H)).  Medications:  Heparin Dosing Weight: 79.8 kg PTA: Xarelto 20 QHS (last dose on 11/03 PTA) Inpatient: [facility] 5k Q8h  >> heparin gtt   Assessment: 56yo F w/ h/o drug and alcohol abuse, HTN, HLD, MDD and AFib, who was admitted to [facility] on 10/12/2022 for severe sepsis with acute organ dysfunction. Pharmacy consulted to transition of prophylactic heparin to infusion. Pt xarelto for Afib PTA is on hold ISO severe AKI until renal fxn normalizes.   Date Time aPTT/HL Rate/Comment 11/7 0215 87  / 0.27 Thera?; 1250u/hr 11/7 1219 112 / 0.32 Thera; 1250u/hr  11/7 2050 74  / 0.14 Subthera; 1250u/hr      Baseline Labs: Hgb - 9.0; Plts - 290  Goal of Therapy:  Heparin level 0.3-0.7 units/ml aPTT 66-102 seconds Monitor platelets by anticoagulation protocol: Yes   Plan:  --aPTT and HL are not correlating; note HL is subtherapeutic and aPTT is therapeutic. There does not appear to be any  significant impact of residual Xarelto on HL. Baseline aPTT was not collected so difficult to interpret. Suspect hepatic congestion issues contributing --Will ordere 2400 unit bolus x 1 and increase drip rate to 1450 units/hr --Will re-check aPTT and HL in 8 hours. Plan to follow HL primarily, but want to check aPTT to ensure stabilization given trend --Daily CBC per protocol; monitor closely for bleeding given coagulopathy  Pearla Dubonnet, PharmD Clinical Pharmacist 10/06/2022 10:06 PM

## 2022-10-07 ENCOUNTER — Ambulatory Visit: Payer: Medicaid Other | Admitting: Family

## 2022-10-07 DIAGNOSIS — Z66 Do not resuscitate: Secondary | ICD-10-CM | POA: Diagnosis not present

## 2022-10-07 DIAGNOSIS — Z7189 Other specified counseling: Secondary | ICD-10-CM | POA: Diagnosis not present

## 2022-10-07 DIAGNOSIS — A419 Sepsis, unspecified organism: Secondary | ICD-10-CM | POA: Diagnosis not present

## 2022-10-07 DIAGNOSIS — Z515 Encounter for palliative care: Secondary | ICD-10-CM | POA: Diagnosis not present

## 2022-10-07 LAB — GLUCOSE, CAPILLARY
Glucose-Capillary: 118 mg/dL — ABNORMAL HIGH (ref 70–99)
Glucose-Capillary: 122 mg/dL — ABNORMAL HIGH (ref 70–99)
Glucose-Capillary: 130 mg/dL — ABNORMAL HIGH (ref 70–99)
Glucose-Capillary: 149 mg/dL — ABNORMAL HIGH (ref 70–99)
Glucose-Capillary: 96 mg/dL (ref 70–99)
Glucose-Capillary: 99 mg/dL (ref 70–99)

## 2022-10-07 LAB — COMPREHENSIVE METABOLIC PANEL
ALT: 39 U/L (ref 0–44)
AST: 60 U/L — ABNORMAL HIGH (ref 15–41)
Albumin: 2.6 g/dL — ABNORMAL LOW (ref 3.5–5.0)
Alkaline Phosphatase: 81 U/L (ref 38–126)
Anion gap: 12 (ref 5–15)
BUN: 48 mg/dL — ABNORMAL HIGH (ref 6–20)
CO2: 30 mmol/L (ref 22–32)
Calcium: 8.7 mg/dL — ABNORMAL LOW (ref 8.9–10.3)
Chloride: 97 mmol/L — ABNORMAL LOW (ref 98–111)
Creatinine, Ser: 3.03 mg/dL — ABNORMAL HIGH (ref 0.44–1.00)
GFR, Estimated: 18 mL/min — ABNORMAL LOW (ref >60)
Glucose, Bld: 98 mg/dL (ref 70–99)
Potassium: 3.8 mmol/L (ref 3.5–5.1)
Sodium: 139 mmol/L (ref 135–145)
Total Bilirubin: 8.6 mg/dL — ABNORMAL HIGH (ref 0.3–1.2)
Total Protein: 6.8 g/dL (ref 6.5–8.1)

## 2022-10-07 LAB — URINE CULTURE: Culture: >=100000 — AB

## 2022-10-07 LAB — PROCALCITONIN: Procalcitonin: 0.42 ng/mL

## 2022-10-07 LAB — HEPARIN LEVEL (UNFRACTIONATED)
Heparin Unfractionated: 0.48 IU/mL (ref 0.30–0.70)
Heparin Unfractionated: 0.35 IU/mL (ref 0.30–0.70)

## 2022-10-07 LAB — CBC
HCT: 30.3 % — ABNORMAL LOW (ref 36.0–46.0)
Hemoglobin: 8.8 g/dL — ABNORMAL LOW (ref 12.0–15.0)
MCH: 25.1 pg — ABNORMAL LOW (ref 26.0–34.0)
MCHC: 29 g/dL — ABNORMAL LOW (ref 30.0–36.0)
MCV: 86.6 fL (ref 80.0–100.0)
Platelets: 309 10*3/uL (ref 150–400)
RBC: 3.5 MIL/uL — ABNORMAL LOW (ref 3.87–5.11)
RDW: 26.9 % — ABNORMAL HIGH (ref 11.5–15.5)
WBC: 12.8 10*3/uL — ABNORMAL HIGH (ref 4.0–10.5)
nRBC: 0.5 % — ABNORMAL HIGH (ref 0.0–0.2)

## 2022-10-07 LAB — MAGNESIUM: Magnesium: 1.7 mg/dL (ref 1.7–2.4)

## 2022-10-07 LAB — VITAMIN B1: Vitamin B1 (Thiamine): 233.9 nmol/L — ABNORMAL HIGH (ref 66.5–200.0)

## 2022-10-07 LAB — APTT
aPTT: 159 seconds — ABNORMAL HIGH (ref 24–36)
aPTT: 129 seconds — ABNORMAL HIGH (ref 24–36)

## 2022-10-07 LAB — PHOSPHORUS: Phosphorus: 4.6 mg/dL (ref 2.5–4.6)

## 2022-10-07 MED ORDER — METOPROLOL TARTRATE 5 MG/5ML IV SOLN
INTRAVENOUS | Status: AC
  Filled 2022-10-07: qty 5

## 2022-10-07 MED ORDER — ADULT MULTIVITAMIN W/MINERALS CH
1.0000 | ORAL_TABLET | Freq: Every day | ORAL | Status: DC
  Administered 2022-10-08: 1 via ORAL
  Filled 2022-10-07: qty 1

## 2022-10-07 MED ORDER — ENSURE ENLIVE PO LIQD
237.0000 mL | Freq: Three times a day (TID) | ORAL | Status: DC
  Administered 2022-10-07 – 2022-10-08 (×5): 237 mL via ORAL

## 2022-10-07 MED ORDER — AMOXICILLIN-POT CLAVULANATE 500-125 MG PO TABS
1.0000 | ORAL_TABLET | Freq: Two times a day (BID) | ORAL | Status: AC
  Administered 2022-10-08 (×2): 1 via ORAL
  Filled 2022-10-07 (×2): qty 1

## 2022-10-07 MED ORDER — HALOPERIDOL LACTATE 5 MG/ML IJ SOLN: 2.0000 mg | Freq: Four times a day (QID) | INTRAMUSCULAR | Status: DC | PRN

## 2022-10-07 MED ORDER — METOPROLOL TARTRATE 5 MG/5ML IV SOLN
2.5000 mg | Freq: Once | INTRAVENOUS | Status: AC
  Administered 2022-10-07: 2.5 mg via INTRAVENOUS

## 2022-10-07 MED ORDER — MAGNESIUM SULFATE 2 GM/50ML IV SOLN
2.0000 g | Freq: Once | INTRAVENOUS | Status: AC
  Administered 2022-10-07: 2 g via INTRAVENOUS
  Filled 2022-10-07: qty 50

## 2022-10-07 NOTE — Consult Note (Signed)
ANTICOAGULATION CONSULT NOTE  Pharmacy Consult for heparin gtt Indication: atrial fibrillation  Patient Measurements: Height: '5\' 4"'$  (162.6 cm) Weight: 105.9 kg (233 lb 7.5 oz) IBW/kg (Calculated) : 54.7 Heparin Dosing Weight: 79.8 kg  Labs: Recent Labs    10/07/2022 1602 10/23/2022 1707 10/05/22 0306 10/05/22 0306 10/05/22 1419 10/06/22 0215 10/06/22 0418 10/06/22 1219 10/06/22 2050 10/07/22 0547  HGB 8.6*  --  9.0*  --   --  9.2*  --   --   --  8.8*  HCT 30.7*  --  32.5*  --   --  32.4*  --   --   --  30.3*  PLT 258  --  290  --   --  307  --   --   --  309  APTT  --   --   --    < >  --  87* 93* 112* 74*  --   LABPROT  --   --   --   --  18.0* 20.8*  --   --   --   --   INR  --   --   --   --  1.5* 1.8*  --   --   --   --   HEPARINUNFRC  --   --   --    < >  --  0.27*  --  0.32 0.14* 0.48  CREATININE 3.83*  --  3.87*  --   --  3.59*  --   --  3.29* 3.03*  TROPONINIHS 51* 49*  --   --   --   --   --   --   --   --    < > = values in this interval not displayed.    Estimated Creatinine Clearance: 24.9 mL/min (A) (by C-G formula based on SCr of 3.03 mg/dL (H)).  Medications:  Heparin Dosing Weight: 79.8 kg PTA: Xarelto 20 QHS (last dose on 11/03 PTA) Inpatient: [facility] 5k Q8h  >> heparin gtt   Assessment: 56yo F w/ h/o drug and alcohol abuse, HTN, HLD, MDD and AFib, who was [facility] on 10/18/2022 for severe sepsis with acute organ dysfunction. Pharmacy consulted to transition of prophylactic heparin to infusion. Pt xarelto for Afib PTA is on hold ISO severe AKI until renal fxn normalizes.   Date Time aPTT/HL Rate/Comment 11/7 0215 87  / 0.27 Thera; 1250u/hr 11/7 1219 112 / 0.32 Thera; 1250u/hr 11/7 2050 74  / 0.14 Subth; 1250u/hr 11/7 0547 159 / 0.48 Thera; 1450u/hr      Baseline Labs: Hgb - 9.0; Plts - 290  Goal of Therapy:  Heparin level 0.3-0.7 units/ml aPTT 66-102 seconds Monitor platelets by anticoagulation protocol: Yes   Plan:  --aPTT and HL are not  correlating; note HL is therapeutic and aPTT is supratherapeutic. There does not appear to be any significant impact of residual Xarelto on HL. Baseline aPTT was not collected so difficult to interpret. Suspect hepatic congestion issues contributing --Given starkly elevated aPTT but therapeutic HL will try to balance coagulation parameters and decrease heparin infusion to 1350 units/hr for now --Will re-check aPTT and HL in 8 hours. Plan to follow HL primarily, but want to check aPTT to ensure stabilization given trend --Daily CBC per protocol; monitor closely for bleeding given coagulopathy  Chelsea Ramsey 10/07/2022 7:37 AM

## 2022-10-07 NOTE — Progress Notes (Signed)
[facility]  ROUNDING NOTE   Subjective:   Patient seen sitting up in bed in ICU Alert Now placed on HFNC Pressors: Levo and Vaso remain Generalized edema remains Urine output 739m on Furosemide drip Foley catheter Heparin drip in place   Objective:  Vital signs in last 24 hours:  Temp:  [97.5 F (36.4 C)-97.8 F (36.6 C)] 97.5 F (36.4 C) (11/07 2049) Pulse Rate:  [81-231] 121 (11/08 0922) Resp:  [12-34] 21 (11/08 0922) BP: (92-113)/(60-89) 93/68 (11/08 0800) SpO2:  [74 %-100 %] 100 % (11/08 0922) Arterial Line BP: (76-116)/(57-79) 103/71 (11/08 0815) FiO2 (%):  [45 %-46 %] 45 % (11/08 0922) Weight:  [105.9 kg] 105.9 kg (11/08 0456)  Weight change: -0.4 kg Filed Weights   10/05/22 1700 10/07/22 0456  Weight: 106.3 kg 105.9 kg    Intake/Output: I/O last 3 completed shifts: In: 1127.4 [P.O.:100; I.V.:854.5; IV Piggyback:173] Out: 1510 [Urine:935; Stool:575]   Intake/Output this shift:  Total I/O In: -  Out: 135 [Urine:85; Stool:50]  Physical Exam: General: NAD  Head: Normocephalic, atraumatic. Moist oral mucosal membranes  Eyes: Anicteric  Lungs:  Fine basilar crackles, HFNC   Heart: Irregular rate and rhythm  Abdomen:  Soft, nontender  Extremities:  1+ peripheral edema.  Neurologic: Nonfocal, moving all four extremities  Skin: No lesions  Access: None    Basic Metabolic Panel: Recent Labs  Lab 10/03/2022 1602 10/05/22 0306 10/06/22 0215 10/06/22 2050 10/07/22 0547  NA 137 137 136 139 139  K 5.5* 5.2* 4.6 4.1 3.8  CL 90* 93* 95* 97* 97*  CO2 34* 32 '30 29 30  '$ GLUCOSE 64* 93 101* 105* 98  BUN 50* 53* 51* 53* 48*  CREATININE 3.83* 3.87* 3.59* 3.29* 3.03*  CALCIUM 9.3 9.0 8.5* 8.8* 8.7*  MG 2.2  --  1.9  --  1.7  PHOS  --   --  5.5*  --  4.6     Liver Function Tests: Recent Labs  Lab 10/01/22 0627 10/19/2022 1602 10/05/22 0306 10/06/22 0215 10/07/22 0547  AST 63* 70* 73* 70* 60*  ALT 34 36 40 40 39  ALKPHOS 89 93 91 91 81   BILITOT 7.8* 9.0* 9.4* 9.4* 8.6*  PROT 6.7 6.9 7.1 7.0 6.8  ALBUMIN 2.4* 2.6* 2.8* 2.7* 2.6*    No results for input(s): "LIPASE", "AMYLASE" in the last 168 hours. Recent Labs  Lab 10/05/22 0306  AMMONIA 67*     CBC: Recent Labs  Lab 10/01/22 0627 10/26/2022 1602 10/05/22 0306 10/06/22 0215 10/07/22 0547  WBC 7.6 12.0* 12.7* 13.5* 12.8*  NEUTROABS  --  9.4*  --   --   --   HGB 9.1* 8.6* 9.0* 9.2* 8.8*  HCT 31.8* 30.7* 32.5* 32.4* 30.3*  MCV 85.3 88.5 88.8 86.9 86.6  PLT 132* 258 290 307 309     Cardiac Enzymes: No results for input(s): "CKTOTAL", "CKMB", "CKMBINDEX", "TROPONINI" in the last 168 hours.  BNP: Invalid input(s): "POCBNP"  CBG: Recent Labs  Lab 10/06/22 1643 10/06/22 1939 10/06/22 2338 10/07/22 0414 10/07/22 0717  GLUCAP 104* 104* 104* 99 916    Microbiology: Results for orders placed or performed during the hospital encounter of 10/18/2022  Resp Panel by RT-PCR (Flu A&B, Covid) Anterior Nasal Swab     Status: None   Collection Time: 10/14/2022  3:44 PM   Specimen: Anterior Nasal Swab  Result Value Ref Range Status   SARS Coronavirus 2 by RT PCR NEGATIVE NEGATIVE Final  Comment: (NOTE) SARS-CoV-2 target nucleic acids are NOT DETECTED.  The SARS-CoV-2 RNA is generally detectable in upper respiratory specimens during the acute phase of infection. The lowest concentration of SARS-CoV-2 viral copies this assay can detect is 138 copies/mL. A negative result does not preclude SARS-Cov-2 infection and should not be used as the sole basis for treatment or other patient management decisions. A negative result may occur with  improper specimen collection/handling, submission of specimen other than nasopharyngeal swab, presence of viral mutation(s) within the areas targeted by this assay, and inadequate number of viral copies(<138 copies/mL). A negative result must be combined with clinical observations, patient history, and  epidemiological information. The expected result is Negative.  Fact Sheet for Patients:  EntrepreneurPulse.com.au  Fact Sheet for Healthcare Providers:  IncredibleEmployment.be  This test is no t yet approved or cleared by the [place] FDA and  has been authorized for detection and/or diagnosis of SARS-CoV-2 by FDA under an Emergency Use Authorization (EUA). This EUA will remain  in effect (meaning this test can be used) for the duration of the COVID-19 declaration under Section 564(b)(1) of the Act, 21 U.S.C.section 360bbb-3(b)(1), unless the authorization is terminated  or revoked sooner.       Influenza A by PCR NEGATIVE NEGATIVE Final   Influenza B by PCR NEGATIVE NEGATIVE Final    Comment: (NOTE) The Xpert Xpress SARS-CoV-2/FLU/RSV plus assay is intended as an aid in the diagnosis of influenza from Nasopharyngeal swab specimens and should not be used as a sole basis for treatment. Nasal washings and aspirates are unacceptable for Xpert Xpress SARS-CoV-2/FLU/RSV testing.  Fact Sheet for Patients: EntrepreneurPulse.com.au  Fact Sheet for Healthcare Providers: IncredibleEmployment.be  This test is not yet approved or cleared by the [place] FDA and has been authorized for detection and/or diagnosis of SARS-CoV-2 by FDA under an Emergency Use Authorization (EUA). This EUA will remain in effect (meaning this test can be used) for the duration of the COVID-19 declaration under Section 564(b)(1) of the Act, 21 U.S.C. section 360bbb-3(b)(1), unless the authorization is terminated or revoked.  Performed at [facility], [address] [place], [place] [address]   Urine Culture     Status: Abnormal   Collection Time: 10/20/2022  4:41 PM   Specimen: Urine, Clean Catch  Result Value Ref Range Status   Specimen Description   Final    URINE, CLEAN CATCH Performed at [facility], [address], [place], [place] [address]    Special Requests   Final    NONE Performed at [facility], [place], [place] [address]    Culture (A)  Final    >=100,000 COLONIES/mL ESCHERICHIA COLI >=100,000 COLONIES/mL ENTEROCOCCUS FAECALIS    Report Status 10/07/2022 FINAL  Final   Organism ID, Bacteria ESCHERICHIA COLI (A)  Final   Organism ID, Bacteria ENTEROCOCCUS FAECALIS (A)  Final      Susceptibility   Escherichia coli - MIC*    AMPICILLIN <=2 SENSITIVE Sensitive     CEFAZOLIN <=4 SENSITIVE Sensitive     CEFEPIME <=0.12 SENSITIVE Sensitive     CEFTRIAXONE <=0.25 SENSITIVE Sensitive     CIPROFLOXACIN <=0.25 SENSITIVE Sensitive     GENTAMICIN <=1 SENSITIVE Sensitive     IMIPENEM <=0.25 SENSITIVE Sensitive     NITROFURANTOIN <=16 SENSITIVE Sensitive     TRIMETH/SULFA <=20 SENSITIVE Sensitive     AMPICILLIN/SULBACTAM <=2 SENSITIVE Sensitive     PIP/TAZO <=4 SENSITIVE Sensitive     * >=  100,000 COLONIES/mL ESCHERICHIA COLI   Enterococcus faecalis - MIC*    AMPICILLIN <=2 SENSITIVE Sensitive     NITROFURANTOIN <=16 SENSITIVE Sensitive     VANCOMYCIN 1 SENSITIVE Sensitive     * >=100,000 COLONIES/mL ENTEROCOCCUS FAECALIS  Culture, blood (routine x 2)     Status: None (Preliminary result)   Collection Time: 10/02/2022  5:08 PM   Specimen: BLOOD LEFT HAND  Result Value Ref Range Status   Specimen Description BLOOD LEFT HAND  Final   Special Requests   Final    BOTTLES DRAWN AEROBIC ONLY Blood Culture results may not be optimal due to an inadequate volume of blood received in culture bottles   Culture   Final    NO GROWTH 3 DAYS Performed at [facility], [address], [place], [place] [address]    Report Status PENDING  Incomplete  Culture, blood (routine x 2)     Status: None (Preliminary result)   Collection Time: 10/26/2022  5:42 PM   Specimen: BLOOD LEFT HAND  Result Value Ref Range Status   Specimen Description BLOOD LEFT HAND   Final   Special Requests   Final    BOTTLES DRAWN AEROBIC AND ANAEROBIC Blood Culture adequate volume   Culture   Final    NO GROWTH 3 DAYS Performed at [facility], [address] [place], [place] [address]    Report Status PENDING  Incomplete  MRSA Next Gen by PCR, Nasal     Status: None   Collection Time: 10/05/22  9:12 AM   Specimen: Nasal Mucosa; Nasal Swab  Result Value Ref Range Status   MRSA by PCR Next Gen NOT DETECTED NOT DETECTED Final    Comment: (NOTE) The GeneXpert MRSA Assay (FDA approved for NASAL specimens only), is one component of a comprehensive MRSA colonization surveillance program. It is not intended to diagnose MRSA infection nor to guide or monitor treatment for MRSA infections. Test performance is not FDA approved in patients less than 23 years old. Performed at [facility], [address] [place], [place] [address]     Coagulation Studies: Recent Labs    10/05/22 1419 10/06/22 0215  LABPROT 18.0* 20.8*  INR 1.5* 1.8*     Urinalysis: Recent Labs    10/01/2022 1641  COLORURINE [place]*  LABSPEC 1.024  PHURINE TEST NOT REPORTED DUE TO COLOR INTERFERENCE OF URINE PIGMENT  GLUCOSEU TEST NOT REPORTED DUE TO COLOR INTERFERENCE OF URINE PIGMENT*  HGBUR TEST NOT REPORTED DUE TO COLOR INTERFERENCE OF URINE PIGMENT*  BILIRUBINUR TEST NOT REPORTED DUE TO COLOR INTERFERENCE OF URINE PIGMENT*  KETONESUR TEST NOT REPORTED DUE TO COLOR INTERFERENCE OF URINE PIGMENT*  PROTEINUR TEST NOT REPORTED DUE TO COLOR INTERFERENCE OF URINE PIGMENT*  NITRITE TEST NOT REPORTED DUE TO COLOR INTERFERENCE OF URINE PIGMENT*  LEUKOCYTESUR TEST NOT REPORTED DUE TO COLOR INTERFERENCE OF URINE PIGMENT*       Imaging: NM Hepatobiliary Liver Func  Result Date: 10/06/2022 CLINICAL DATA:  Hyperbilirubinemia.  Concern for cholecystitis. EXAM: NUCLEAR MEDICINE HEPATOBILIARY IMAGING TECHNIQUE: Sequential images of the abdomen were obtained out to 60 minutes  following intravenous administration of radiopharmaceutical. RADIOPHARMACEUTICALS:  7.3 mCi Tc-82m Choletec IV COMPARISON:  Ultrasound 10/05/2022 FINDINGS: There is delayed clearance radiotracer from the blood pool. Delayed clearance of radiotracer from the liver into the bile ducts. The gallbladder begins to fill at 60 minutes. The gallbladder is elongated nondistended. No counts evident within common bile duct or small bowel over the  course the study. IMPRESSION: 1. Filling of the gallbladder consistent with patent cystic duct and NOT consistent with acute cholecystitis. 2. Poor clearance radiotracer from blood pool and poor clearance of radiotracer from the liver. Favor liver dysfunction over biliary obstruction. The common bile duct is normal on ultrasound 1 day prior. The gallbladder is nondistended. Electronically Signed   By: Suzy Bouchard M.D.   On: 10/06/2022 16:52   DG Chest Port 1 View  Result Date: 10/05/2022 CLINICAL DATA:  Evaluate central venous catheter placement EXAM: PORTABLE CHEST 1 VIEW COMPARISON:  10/03/2022 FINDINGS: Signs of previous median sternotomy. 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 There is a right IJ catheter with tip in the projection of the SVC. No pneumothorax identified. Cardiac enlargement is unchanged. Unchanged appearance of diffuse reticular interstitial opacities throughout both lungs. No sign of pleural effusion. IMPRESSION: Right IJ catheter with tip in the projection of the SVC. No pneumothorax. No change in diffuse increase interstitial opacities which may reflect interstitial edema, atypical infection or chronic lung disease. Electronically Signed   By: Kerby Moors M.D.   On: 10/05/2022 14:14     Medications:    sodium chloride     furosemide (LASIX) 200 mg in dextrose 5 % 100 mL (2 mg/mL) infusion 4 mg/hr (10/07/22 0000)   heparin 1,450 Units/hr (10/07/22 0552)   magnesium sulfate bolus IVPB 2 g (10/07/22 0956)   norepinephrine  (LEVOPHED) Adult infusion 6 mcg/min (10/07/22 0000)   piperacillin-tazobactam (ZOSYN)  IV 3.375 g (10/07/22 0544)   vasopressin 0.03 Units/min (10/07/22 0408)    Chlorhexidine Gluconate Cloth  6 each Topical Daily   lactulose  30 g Oral BID   pantoprazole  80 mg Oral Daily   rifaximin  550 mg Oral BID   rosuvastatin  10 mg Oral Daily   acetaminophen **OR** acetaminophen, docusate sodium, fentaNYL (SUBLIMAZE) injection, haloperidol lactate, hydrALAZINE, LORazepam, melatonin, nitroGLYCERIN, senna-docusate  Assessment/ Plan:  [name] is a 56 y.o.  female with past medical conditions including drug and alcohol abuse, hypertension, hyperlipidemia, depression and atrial fib, who was admitted to [facility] on 10/09/2022 for Generalized weakness [R53.1] AKI (acute kidney injury) ([place]) [N17.9] Severe sepsis with acute organ dysfunction ([place]) [A41.9, R65.20] Acute on chronic respiratory failure with hypoxia ([place]) [J96.21] Sepsis, due to unspecified organism, unspecified whether acute organ dysfunction present ([place]) [A41.9]   Acute kidney injury with hyperkalemia likely secondary to cardiorenal syndrome and possible UTI, urine culture pending. Normal renal function in July 2023. Creatinine on admission , 3.83.   Creatinine slowly improving with increased urine output noted.  785 mL recorded in Foley catheter.  No acute need for dialysis at this time.  We will continue to monitor renal function during diuretic therapy.  Lab Results  Component Value Date   CREATININE 3.03 (H) 10/07/2022   CREATININE 3.29 (H) 10/06/2022   CREATININE 3.59 (H) 10/06/2022    Intake/Output Summary (Last 24 hours) at 10/07/2022 1020 Last data filed at 10/07/2022 0800 Gross per 24 hour  Intake 661.24 ml  Output 1195 ml  Net -533.76 ml    2. Anemia, likely due to kidney injury.  We will continue to   3. Hypotension likely secondary to sepsis.remains on pressors.  4. Chronic systolic heart failure.  ECHO on 09/23/22 shows EF 35-40%, right ventricular enlarged with severe tricuspid regurgitation and aortic calcification.  Continue furosemide drip, titration deferred to cardiac.   LOS: 3   11/8/202310:20 AM

## 2022-10-07 NOTE — Progress Notes (Signed)
Nutrition Follow-up  DOCUMENTATION CODES:   Obesity unspecified  INTERVENTION:   -MVI with minerals daily -Ensure Enlive po TID, each supplement provides 350 kcal and 20 grams of protein -Liberalize diet to 2 gram sodium for wider variety of meal selections  NUTRITION DIAGNOSIS:   Inadequate oral intake related to acute illness (AMS) as evidenced by NPO status.  Progressing; advanced to PO diet 10/07/22  GOAL:   Patient will meet greater than or equal to 90% of their needs  Progressing   MONITOR:   Diet advancement, Labs, Weight trends, Skin, I & O's  REASON FOR ASSESSMENT:   Rounds    ASSESSMENT:   56 y/o female with h/o Afib, HTN, substance abuse, CHF, gastric ulcer, small bowel lymphangiectasias, fibromyalgia, DDD, OSA, CKD III, DM, depression, anxiety, COPD, GERD and chronic hepatic parenchymal disease who is admitted with AKI, sepsis, AMS and respiratory failure.  Reviewed I/O's: -549 ml x 24 hours and +4.7 L since admission  UOP: 785 ml x 24 hours  Rectal tube output: 475 ml x 24 hours  Spoke with pt at bedside, who reports feeling better today. She smiled when RD told pt that she recalled her from prior admission. Pt reports she ate a few bites of french toast and a few bites of scrambled eggs this morning; observed breakfast tray and confirmed this report to be accurate. She is happy to be able to eat. Per pt, she does not have teeth and did not find the dysphagia diet helpful to her last admission and desires to continue without a mechanically altered diet.   Discussed importance of good meal and supplement intake to promote healing. Pt shares that she "loved" the ENsure supplements and would like to have them again.   Medications reviewed and include lactulose and IV lasix.   Labs reviewed: CBGS: 96-149 (inpatient orders for glycemic control are none).    Diet Order:   Diet Order             Diet 2 gram sodium Room service appropriate? Yes; Fluid  consistency: Thin  Diet effective now                   EDUCATION NEEDS:   No education needs have been identified at this time  Skin:  Skin Assessment: Reviewed RN Assessment (ecchymosis)  Last BM:  10/07/22 (200 ml via rectal tube)  Height:   Ht Readings from Last 1 Encounters:  10/05/22 '5\' 4"'$  (1.626 m)    Weight:   Wt Readings from Last 1 Encounters:  10/07/22 105.9 kg    Ideal Body Weight:  54.5 kg  BMI:  Body mass index is 40.07 kg/m.  Estimated Nutritional Needs:   Kcal:  2000-2300kcal/day  Protein:  100-115g/day  Fluid:  1.7-1.9L/day    Loistine Chance, RD, LDN, [place] Registered Dietitian II Certified Diabetes Care and Education Specialist Please refer to [facility] for RD and/or RD on-call/weekend/after hours pager

## 2022-10-07 NOTE — Progress Notes (Signed)
NAME:  Chelsea Ramsey, MRN:  [id number], DOB:  1966-03-28, LOS: 3 ADMISSION DATE:  10/20/2022, CONSULTATION DATE:  10/05/2022 REFERRING MD:  Neomia Glass NP  REASON FOR CONSULT:  Hypotension   HPI  56 y.o female with significant PMH of drug-induced gout of left foot (03/01/2018), Anxiety, Arthritis, Asthma,COPD, Coronary artery disease, HFrEF (current EF 35-40%) DM, Fibromyalgia, GERD, Hypertension, Pulmonary HTN,  Rheumatic fever/heart disease, paroxysmal atrial fibrillation on Xarelto, COPD w/ chronic respiratory failure on baseline 3 L, ongoing tobacco use, TIA/CVA, and Sleep apnea. who presented to the ED with chief complaints of generalized weakness shortness of breath and itching.  On review of her chart, patient was recently discharged on 10/17 following admission for acute on chronic CHF exacerbation, transaminitis, AKI and altered mental status.  Per ED reports, since discharge patient has been progressively weak and not able to walk or take her medication as prescribed. Per EMS report, patient fell from her wheelchair but denied hitting her head or LOC.   ED Course: Initial vital signs showed HR of 99 beats/minute, BP  129/93 mm Hg, the RR 21 breaths/minute, and the oxygen saturation  98% on 4L [place] and a temperature of 97.1F (36.9C).   Pertinent Labs/Diagnostics Findings: Chemistry:Na+/ K+: 137/5.5.  Glucose: 64.  BUN/Cr.:50/3.83  AST/ALT: 70/36, bilirubin direct/indirect 4.7/4.3 CBC: WBC: 12.  Hgb/Hct: 8.6/30.7   Other Lab findings:   PCT: 0.69.  Lactic acid: 2.1 negative, Troponin: 51.  BNP: 785.2 Imaging:  CXR> chronic interstitial lung disease with questionable superimposed edema and small left pleural effusion.  CTH> no acute intracranial abnormality  Patient given 30 cc/kg of fluids and started on broad-spectrum antibiotics Vanco cefepime and Flagyl for spectated sepsis.  She was subsequently admitted to hospitalist service. Patient remained hypotensive despite IVF  boluses therefore was started on Levophed.  (Sepsis reassessment completed). PCCM consulted.  Past Medical History  drug-induced gout of left foot (03/01/2018), Anxiety, Arthritis, Asthma,COPD, Coronary artery disease, HFrEF (current EF 35-40%) DM, Fibromyalgia, GERD, Hypertension, Pulmonary HTN,  Rheumatic fever/heart disease, paroxysmal atrial fibrillation on Xarelto, COPD w/ chronic respiratory failure on baseline 3 L, ongoing tobacco use, TIA/CVA, and Sleep apnea  Significant Hospital Events   11/5: Admit to hospitalist service with sepsis of unknown origin and AMS. 11/6: Remained hypotensive despite IVF and started on pressors. PCCM consulted 10/06/22- patient confused with encephalopathy, rectal tube with loose stools  Consults:  PCCM Cardiology  Procedures:  None  Significant Diagnostic Tests:  11/5: Chest Xray> 11/5: Noncontrast CT head>  Micro Data:  11/5: SARS-CoV-2 PCR> negative 11/5: Influenza PCR> negative 11/5: Blood culture x2> 11/5: Urine Culture> 11/5: MRSA PCR>>   Antimicrobials:  Vancomycin 11/5>> Cefepime 11/5>>  OBJECTIVE  Blood pressure 93/68, pulse (!) 126, temperature (!) 97.5 F (36.4 C), temperature source Axillary, resp. rate (!) 21, height '5\' 4"'$  (1.626 m), weight 105.9 kg, last menstrual period 11/08/2009, SpO2 91 %.    FiO2 (%):  [45 %-46 %] 45 %   Intake/Output Summary (Last 24 hours) at 10/07/2022 1250 Last data filed at 10/07/2022 1125 Gross per 24 hour  Intake 1096.63 ml  Output 1210 ml  Net -113.37 ml    Filed Weights   10/05/22 1700 10/07/22 0456  Weight: 106.3 kg 105.9 kg    Physical Examination  GENERAL: 56 year-old critically ill patient lying in the bed with no acute distress.  EYES: Pupils equal, round, reactive to light and accommodation. No scleral icterus. Extraocular muscles intact.  HEENT: Head atraumatic, normocephalic. Oropharynx  and nasopharynx clear.  NECK:  Supple, no jugular venous distention. No thyroid  enlargement, no tenderness.  LUNGS: Normal breath sounds bilaterally, no wheezing, rales,rhonchi or crepitation. No use of accessory muscles of respiration.  CARDIOVASCULAR: S1, S2 normal. No murmurs, rubs, or gallops.  ABDOMEN: Soft, nontender, nondistended. Bowel sounds present. No organomegaly or mass.  EXTREMITIES: Upper and lower extremities are atraumatic in appearance without tenderness or deformity. Mild trace edema of bilateral lower extremities. Unable to assess muscle strength i. Capillary refill is less than 3 seconds in all extremities. Pulses palpable.  NEUROLOGIC:The patient is  lethargic but arouses easily  with somewhat slurred speech. Moves all extremities. Sensation is intact bilaterally. Reflexes 2+ bilaterally.  intact. Gait not checked.  PSYCHIATRIC: The patient is drowsy, oriented to self only SKIN: No obvious rash, lesion, or ulcer.   Labs/imaging that I havepersonally reviewed  (right click and "Reselect all SmartList Selections" daily)     Labs   CBC: Recent Labs  Lab 10/01/22 0627 10/08/2022 1602 10/05/22 0306 10/06/22 0215 10/07/22 0547  WBC 7.6 12.0* 12.7* 13.5* 12.8*  NEUTROABS  --  9.4*  --   --   --   HGB 9.1* 8.6* 9.0* 9.2* 8.8*  HCT 31.8* 30.7* 32.5* 32.4* 30.3*  MCV 85.3 88.5 88.8 86.9 86.6  PLT 132* 258 290 307 309     Basic Metabolic Panel: Recent Labs  Lab 10/23/2022 1602 10/05/22 0306 10/06/22 0215 10/06/22 2050 10/07/22 0547  NA 137 137 136 139 139  K 5.5* 5.2* 4.6 4.1 3.8  CL 90* 93* 95* 97* 97*  CO2 34* 32 '30 29 30  '$ GLUCOSE 64* 93 101* 105* 98  BUN 50* 53* 51* 53* 48*  CREATININE 3.83* 3.87* 3.59* 3.29* 3.03*  CALCIUM 9.3 9.0 8.5* 8.8* 8.7*  MG 2.2  --  1.9  --  1.7  PHOS  --   --  5.5*  --  4.6    GFR: Estimated Creatinine Clearance: 24.9 mL/min (A) (by C-G formula based on SCr of 3.03 mg/dL (H)). Recent Labs  Lab 10/22/2022 1602 10/12/2022 1707 10/05/22 0306 10/05/22 1357 10/06/22 0215 10/07/22 0547  PROCALCITON  --   0.69 0.53  --  0.57 0.42  WBC 12.0*  --  12.7*  --  13.5* 12.8*  LATICACIDVEN  --  2.1* 1.8 1.3  --   --      Liver Function Tests: Recent Labs  Lab 10/01/22 0627 10/28/2022 1602 10/05/22 0306 10/06/22 0215 10/07/22 0547  AST 63* 70* 73* 70* 60*  ALT 34 36 40 40 39  ALKPHOS 89 93 91 91 81  BILITOT 7.8* 9.0* 9.4* 9.4* 8.6*  PROT 6.7 6.9 7.1 7.0 6.8  ALBUMIN 2.4* 2.6* 2.8* 2.7* 2.6*    No results for input(s): "LIPASE", "AMYLASE" in the last 168 hours. Recent Labs  Lab 10/05/22 0306  AMMONIA 67*    ABG    Component Value Date/Time   PHART 7.39 10/05/2022 1401   PCO2ART 54 (H) 10/05/2022 1401   PO2ART 76 (L) 10/05/2022 1401   HCO3 32.7 (H) 10/05/2022 1401   ACIDBASEDEF 0.2 12/20/2017 2033   O2SAT 64.5 10/06/2022 0418     Coagulation Profile: Recent Labs  Lab 10/05/22 1419 10/06/22 0215  INR 1.5* 1.8*     Cardiac Enzymes: No results for input(s): "CKTOTAL", "CKMB", "CKMBINDEX", "TROPONINI" in the last 168 hours.  HbA1C: Hemoglobin A1C  Date/Time Value Ref Range Status  03/08/2020 03:52 PM 6.7 (A) 4.0 - 5.6 % Final  08/21/2014 12:00 AM 5.2  Final  06/03/2012 04:52 AM 4.5 4.2 - 6.3 % Final    Comment:    The American Diabetes Association recommends that a primary goal of therapy should be <7% and that physicians should reevaluate the treatment regimen in patients with HbA1c values consistently >8%.    Hgb A1c MFr Bld  Date/Time Value Ref Range Status  09/15/2022 12:59 PM 4.5 (L) 4.8 - 5.6 % Final    Comment:    (NOTE) Pre diabetes:          5.7%-6.4%  Diabetes:              >6.4%  Glycemic control for   <7.0% adults with diabetes   08/24/2019 04:54 PM 6.2 (H) 4.8 - 5.6 % Final    Comment:    (NOTE)         Prediabetes: 5.7 - 6.4         Diabetes: >6.4         Glycemic control for adults with diabetes: <7.0     CBG: Recent Labs  Lab 10/06/22 1939 10/06/22 2338 10/07/22 0414 10/07/22 0717 10/07/22 1124  GLUCAP 104* 104* 99 96 149*      Review of Systems:   UNABLE TO OBTAIN PATIENT IS ALTERED  Past Medical History  She,  has a past medical history of Acute drug-induced gout of left foot (03/01/2018), Allergy, Anxiety, Arthritis, Asthma, CHF (congestive heart failure) ([name]), COPD (chronic obstructive pulmonary disease) ([place]), Coronary artery disease, Diabetes mellitus without complication ([place]), Dysrhythmia, Fibromyalgia, GERD (gastroesophageal reflux disease), Hypertension, Pneumonia, PUD (peptic ulcer disease), Pulmonary HTN ([place]), Rheumatic fever/heart disease, and Sleep apnea.   Surgical History    Past Surgical History:  Procedure Laterality Date   ADENOIDECTOMY     COLONOSCOPY WITH PROPOFOL N/A 11/06/2019   Procedure: COLONOSCOPY WITH PROPOFOL;  Surgeon: Virgel Manifold, MD;  Location: [facility] ENDOSCOPY;  Service: Endoscopy;  Laterality: N/A;  2 day prep    COLONOSCOPY WITH PROPOFOL N/A 06/29/2022   Procedure: COLONOSCOPY WITH PROPOFOL;  Surgeon: Lin Landsman, MD;  Location: [facility] ENDOSCOPY;  Service: Gastroenterology;  Laterality: N/A;   CORONARY ARTERY BYPASS GRAFT     June 27 2018   ESOPHAGOGASTRODUODENOSCOPY (EGD) WITH PROPOFOL N/A 05/25/2018   Procedure: ESOPHAGOGASTRODUODENOSCOPY (EGD) WITH PROPOFOL;  Surgeon: Lucilla Lame, MD;  Location: [facility] ENDOSCOPY;  Service: Endoscopy;  Laterality: N/A;   ESOPHAGOGASTRODUODENOSCOPY (EGD) WITH PROPOFOL N/A 11/06/2019   Procedure: ESOPHAGOGASTRODUODENOSCOPY (EGD) WITH PROPOFOL;  Surgeon: Virgel Manifold, MD;  Location: [facility] ENDOSCOPY;  Service: Endoscopy;  Laterality: N/A;   ESOPHAGOGASTRODUODENOSCOPY (EGD) WITH PROPOFOL N/A 06/29/2022   Procedure: ESOPHAGOGASTRODUODENOSCOPY (EGD) WITH PROPOFOL;  Surgeon: Lin Landsman, MD;  Location: [facility] ENDOSCOPY;  Service: Gastroenterology;  Laterality: N/A;   GIVENS CAPSULE STUDY N/A 07/22/2022   Procedure: GIVENS CAPSULE STUDY;  Surgeon: Lin Landsman, MD;  Location: [facility] ENDOSCOPY;  Service: Gastroenterology;   Laterality: N/A;   MITRAL VALVE REPLACEMENT     MULTIPLE TOOTH EXTRACTIONS     TONSILLECTOMY     TOTAL KNEE ARTHROPLASTY Right 09/04/2016   Procedure: TOTAL KNEE ARTHROPLASTY; with lateral release;  Surgeon: Earlie Server, MD;  Location: [facility];  Service: Orthopedics;  Laterality: Right;     Social History   reports that she has been smoking cigarettes. She has been smoking an average of .1 packs per day. She has never used smokeless tobacco. She reports that she does not currently use drugs. She reports that she does not drink  alcohol.   Family History   Her family history includes Asthma in her mother; Breast cancer in her maternal aunt; COPD in her mother; Cancer in her mother; Congestive Heart Failure in her father; Rheum arthritis in her mother.   Allergies Allergies  Allergen Reactions   Amiodarone Nausea And Vomiting   Aspirin Swelling   Flexeril [Cyclobenzaprine] Swelling   Trazamine [Trazodone & Diet Manage Prod] Nausea And Vomiting   Codeine Rash   Tramadol Rash     Home Medications  Prior to Admission medications   Medication Sig Start Date End Date Taking? Authorizing Provider  albuterol (VENTOLIN HFA) 108 (90 Base) MCG/ACT inhaler Inhale 1-2 puffs into the lungs every 6 (six) hours as needed for wheezing or shortness of breath.    [provider]  allopurinol (ZYLOPRIM) 100 MG tablet Take 1 tablet (100 mg total) by mouth 2 (two) times daily. 03/05/20   Milinda Pointer, MD  Cholecalciferol (VITAMIN D) 50 MCG (2000 UT) CAPS Take 1 capsule (2,000 Units total) by mouth daily. 06/19/21   Alisa Graff, FNP  colchicine 0.6 MG tablet Take 1 tablet (0.6 mg total) by mouth daily. As directed 03/05/20   Milinda Pointer, MD  diazepam (VALIUM) 10 MG tablet Take 10 mg by mouth 2 (two) times daily as needed for anxiety. 07/16/22   [provider]  docusate sodium (COLACE) 100 MG capsule Take 100 mg by mouth 2 (two) times daily as needed for mild constipation or  moderate constipation.    [provider]  empagliflozin (JARDIANCE) 10 MG TABS tablet Take 10 mg by mouth daily.    [provider]  furosemide (LASIX) 20 MG tablet Take 3 tablets (60 mg total) by mouth daily. 10/02/22 11/01/22  Wyvonnia Dusky, MD  gabapentin (NEURONTIN) 600 MG tablet Take 0.5 tablets (300 mg total) by mouth 2 (two) times daily. Patient taking differently: Take 600 mg by mouth 3 (three) times daily. 12/06/18   Vaughan Basta, MD  ipratropium-albuterol (DUONEB) 0.5-2.5 (3) MG/3ML SOLN INHALE THE CONTENTS OF 1 VIAL(3MLS) VIA NEBULIZER 3 TIMES DAILY AS NEEDED 10/24/19   Carles Collet M, PA-C  Iron-FA-B Cmp-C-Biot-Probiotic (FUSION PLUS) CAPS Take 1 capsule by mouth daily. Patient not taking: Reported on 09/16/2022 06/25/22   Lin Landsman, MD  lactulose (CHRONULAC) 10 GM/15ML solution Take 45 mLs (30 g total) by mouth 2 (two) times daily. 10/01/22 10/31/22  Wyvonnia Dusky, MD  metolazone (ZAROXOLYN) 2.5 MG tablet Take 1 tablet (2.5 mg total) by mouth every Monday, Wednesday, and Friday. 10/02/22 11/01/22  Wyvonnia Dusky, MD  metoprolol tartrate (LOPRESSOR) 50 MG tablet Take 1 tablet (50 mg total) by mouth 2 (two) times daily. 10/01/22 10/31/22  Wyvonnia Dusky, MD  nitroGLYCERIN (NITROSTAT) 0.4 MG SL tablet Place 1 tablet (0.4 mg total) under the tongue every 5 (five) minutes as needed for chest pain. 06/19/21   Alisa Graff, FNP  omeprazole (PRILOSEC) 40 MG capsule Take 40 mg by mouth daily.    [provider]  oxyCODONE ER 9 MG C12A Take 9 mg by mouth 2 (two) times daily.    [provider]  potassium chloride (KLOR-CON) 10 MEQ tablet Take 4 tablets (40 mEq total) by mouth 2 (two) times daily. And additional 50mq on metolazone days Patient taking differently: Take 40 mEq by mouth See admin instructions. Take 4 tablets (458m) by mouth twice daily - take an additional 2 tablets (2029m by mouth when taking metolazone 01/05/22  Darylene Price A, FNP  promethazine (PHENERGAN) 12.5 MG tablet Take 1 tablet (12.5 mg total) by mouth every 8 (eight) hours as needed for nausea or vomiting. Patient taking differently: Take 12.5 mg by mouth 2 (two) times daily as needed for nausea or vomiting. 02/20/20   Bacigalupo, Dionne Bucy, MD  rifaximin (XIFAXAN) 550 MG TABS tablet Take 1 tablet (550 mg total) by mouth 2 (two) times daily. 10/01/22 10/31/22  Wyvonnia Dusky, MD  rivaroxaban (XARELTO) 20 MG TABS tablet Take 20 mg by mouth daily with supper.    [provider]  rosuvastatin (CRESTOR) 10 MG tablet Take 10 mg by mouth daily.    [provider]  spironolactone (ALDACTONE) 25 MG tablet Take 0.5 tablets (12.5 mg total) by mouth daily. 10/02/22 11/01/22  Wyvonnia Dusky, MD  Scheduled Meds:  Derrill Memo ON 10/08/2022] amoxicillin-clavulanate  1 tablet Oral Q12H   Chlorhexidine Gluconate Cloth  6 each Topical Daily   feeding supplement  237 mL Oral TID BM   lactulose  30 g Oral BID   multivitamin with minerals  1 tablet Oral Daily   pantoprazole  80 mg Oral Daily   rifaximin  550 mg Oral BID   rosuvastatin  10 mg Oral Daily   Continuous Infusions:  sodium chloride     furosemide (LASIX) 200 mg in dextrose 5 % 100 mL (2 mg/mL) infusion 4 mg/hr (10/07/22 1125)   heparin 1,350 Units/hr (10/07/22 1125)   norepinephrine (LEVOPHED) Adult infusion 6 mcg/min (10/07/22 1125)   piperacillin-tazobactam (ZOSYN)  IV Stopped (10/07/22 0944)   vasopressin 0.03 Units/min (10/07/22 1125)   PRN Meds:.acetaminophen **OR** acetaminophen, docusate sodium, fentaNYL (SUBLIMAZE) injection, haloperidol lactate, hydrALAZINE, LORazepam, melatonin, nitroGLYCERIN, senna-docusate  Active Hospital Problem list     Assessment & Plan:  Acute on Chronic  Hypoxic Respiratory Failure secondary to Acute on chronic Decompensated HFrEF, AECOPD and ?underlying Pneumonia PMHx: COPD w/ chronic respiratory failure on baseline 3 L, ongoing tobacco  use , OSA on CPAP -Supplemental O2 as needed to maintain O2 saturations 88 to 92% -Consider NIPPV/BiPAP, wean as tolerated -High risk for intubation -Follow intermittent ABG and chest x-ray as needed -As needed bronchodilators -Encourage smoking cessation  Sepsis with septic shock -urinary source - PRESENT ON ADMISSION Lactic: 2.1, Baseline PCT: 0.69, UA:? UTI , CXR: ? Pneumonia Initial interventions/workup included: 3 L of NS/LR & Cefepime/ Vancomycin/ meets SIRS criteria: Shock Index (SI)2 -Supplemental oxygen as needed, to maintain SpO2 > 90% -F/u cultures, trend lactic/ PCT -Monitor WBC/ fever curve -IV antibiotics: cefepime & vancomycin -Gentle IVF hydration as needed -Pressors for MAP goal >65 -Strict I/O's   Acute on Chronic Decompensated HFrEF (current EF 20-25%) PAF PMHx: CAD, HLD, HTNsevere pulmonary hypertension, RV dysfunction,Rheumatic mitral valve stenosis s/p MVR 2019  -BNP on admission was elevated at 558.5 with small probable interstitial edema and small left pleural effusion on cxr. -Continuous cardiac monitoring -Diuresis as BP and renal function permits ~ currently holding due to AKI and hypotension -Hold metoprolol and Spironolactone for now -Hold Xarelto -Continue midodrine -Cardiology consult if appropriate   AKI on CKD Stage III Hyperkalemia -Monitor I&O's / urinary output -Follow BMP -Ensure adequate renal perfusion -Avoid nephrotoxic agents as able -Replace electrolytes as indicated   Acute Metabolic Encephalopathy ~ Hx: TIA/CVA, polypharmacy on multiple sedating meds -Provide supportive care -Avoid sedating meds as able -CT Head  negative for acute intracranial abnormality   Diabetes mellitus HgbA1c  -CBG's Q4; Target range of 140 to 180 -SSI -Follow  ICU Hypo/Hyperglycemia protocol   Best practice:  Diet:  NPO Pain/Anxiety/Delirium protocol (if indicated): No VAP protocol (if indicated): Not indicated DVT prophylaxis: Subcutaneous  Heparin GI prophylaxis: PPI Glucose control:  SSI Yes Central venous access:  N/A Arterial line:  N/A Foley:  N/A Mobility:  bed rest  PT consulted: N/A Last date of multidisciplinary goals of care discussion [11/6] Code Status:  DNR Disposition: Stepdown   = Goals of Care = Code Status Order: DNR  Primary Emergency Contact: Murdock,[name] Patient wishes to pursue ongoing treatment, but concurred that if deteriorated to pulselessness, patient would prefer a natural death as opposed to invasive measures such as CPR and intubation.  Family should be immediately contacted in such situations.  Critical care provider statement:   Total critical care time: 33 minutes   Performed by: Lanney Gins MD   Critical care time was exclusive of separately billable procedures and treating other patients.   Critical care was necessary to treat or prevent imminent or life-threatening deterioration.   Critical care was time spent personally by me on the following activities: development of treatment plan with patient and/or surrogate as well as nursing, discussions with consultants, evaluation of patient's response to treatment, examination of patient, obtaining history from patient or surrogate, ordering and performing treatments and interventions, ordering and review of laboratory studies, ordering and review of radiographic studies, pulse oximetry and re-evaluation of patient's condition.    Ottie Glazier, M.D.  Pulmonary & Critical Care Medicine

## 2022-10-07 NOTE — Progress Notes (Signed)
Daily Progress Note   Patient Name: Chelsea Ramsey       Date: 10/07/2022 DOB: [birth year]  Age: 56 y.o. MRN#: [id number] Attending Physician: Ottie Glazier, MD Primary Care Physician: Emelia Loron, NP Admit Date: 10/26/2022 Length of Stay: 3 days  Reason for Consultation/Follow-up: Establishing goals of care  HPI/Patient Profile:  56 y.o. female  with past medical history of morbid obesity, history of drug use including cocaine and THC, history of EtOH abuse, neuropathy, depression, atrial fibrillation on Xarelto, hyperlipidemia, hypertension, who presented on 10/16/2022 for evaluation or progressively worsening shortness of breath.    Noted recent admissions for similar presentation, discharge most recently on 11/2.  Prior admission records reviewed.  Patient has had hyperbilirubinemia secondary to congestive hepatopathy.  Prior admissions, GI and hematology/oncology were consulted and evaluation was unrevealing.  Possible acalculous cholecystitis was in the differential, but patient poor candidate for surgery or procedures given poor cardiac function and pulmonary hypertension, morbid obesity also a complicating factor.  PCCM assumed care to the circulatory shock requiring vasopressors.   She was admitted on 10/24/2022 with severe sepsis with acute organ dysfunction, altered mental status, A-fib on chronic anticoagulation, hypertension, history of substance abuse, circulatory shock, hepatic steatosis, and others.    PMT was consulted for [place] conversations.  Subjective:   Subjective: Chart Reviewed. Updates received. Patient Assessed. Created space and opportunity for patient  and family to explore thoughts and feelings regarding current medical situation.  Today's Discussion: Today I met with the patient at the bedside.  She was sleeping but easily aroused.  No family is present.  She states she is feeling better overall today.   We again discussed the current situation with a  possible spouse versus significant other, patient's mother, other complex social and family dynamics.  She again made it clear that she wants her mother to be her surrogate decision-maker.  Due to the patient's significant other claiming to be husband and requesting to be called for consents, and other complexities I informed her I feel it is best to have advanced directives completed and naming her mother as her legal decision maker.  She understands.  She states that currently she would want to be her own decision-maker, until she can no longer make her decisions.  I am not sure if she has the ability to understand the ramifications of decisions that she makes.  However, I feel she does have the capacity to designate a surrogate.  She does understand that she is very ill and has a lot of health problems.  She knows that her liver and heart are having problems.  Given the social complexities of the situation I will plan to consult spiritual care for [facility] completion.  Overall the patient feels better today.  She states she is not having any shortness of breath, minimal to no pain.  No nausea or vomiting.  She does have increased oxygen requirements and is currently on heated high flow nasal.  She states that she can make her decisions in general.  The patient wants to continue her current level of care.  Review of Systems  Constitutional:        No significant pain in general  Respiratory:  Negative for shortness of breath (Improved).   Gastrointestinal:  Negative for abdominal pain, nausea and vomiting.    Objective:   Vital Signs:  BP 93/68   Pulse (!) 126   Temp (!) 97.5 F (36.4 C) (Axillary)   Resp (!) 21  Ht _0  (1.626 m)   Wt 105.9 kg   LMP 11/08/2009 Comment: menopause  SpO2 91%   BMI 40.07 kg/m   Physical Exam: Physical Exam Vitals and nursing note reviewed.  Constitutional:      General: She is sleeping. She is not in acute distress.    Appearance: She is ill-appearing.   HENT:     Head: Normocephalic and atraumatic.  Cardiovascular:     Rate and Rhythm: Tachycardia present. Rhythm irregular.  Pulmonary:     Effort: Pulmonary effort is normal. No respiratory distress.     Comments: HFNC present Abdominal:     General: Bowel sounds are normal.     Palpations: Abdomen is soft.  Skin:    General: Skin is warm and dry.  Neurological:     Mental Status: She is easily aroused.  Psychiatric:        Mood and Affect: Mood normal.        Behavior: Behavior normal.     Palliative Assessment/Data: 20-30%    Existing Vynca/ACP Documentation: GOC document dated 09/23/2022  Assessment & Plan:   Impression: Present on Admission:  Severe sepsis with acute organ dysfunction (HCC)  Insomnia  HTN (hypertension)  GAD (generalized anxiety disorder)  A-fib (HCC)  Acute renal failure (HCC)  History of substance abuse ([place])  56 year old female with multiple chronic comorbidities and acute presentations described above.  She is currently quite ill requiring pressors including Levophed and vasopressin.  She is on high flow nasal cannula for oxygen Support.  She remains in A-fib with RVR.  The patient seems to have an element of capacity today.  She states she wants to make her decisions now but if she cannot make her decision she would want her mother to make her decisions, not the "spouse versus significant other".  Spiritual care consulted for advanced directive completion to name her mother as Optician, dispensing. Overall prognosis poor given multisystem organ failure.   SUMMARY OF RECOMMENDATIONS   Remain DNR Spiritual care consult for HCPOA completion Ongoing [place] discussions with patient and her mother as clinical picture evolves PMT will follow  Symptom Management:  Per primary team PMT is available to assist as needed  Code Status: DNR  Prognosis: Unable to determine  Discharge Planning: To Be Determined  Discussed with: Patient, medical team,  nursing team  Thank you for allowing [place] to participate in the care of [name] PMT will continue to support holistically.  Billing based on MDM: High  Problems Addressed: One acute or chronic illness or injury that poses a threat to life or bodily function  Amount and/or Complexity of Data: Category 3:Discussion of management or test interpretation with external physician/other qualified health care professional/appropriate source (not separately reported)  Risks: Decision not to resuscitate or to de-escalate care because of poor prognosis  Walden Field, NP Palliative Medicine Team  Team Phone # [phone] (Nights/Weekends)  07/29/2021, 8:17 AM

## 2022-10-07 NOTE — Progress Notes (Signed)
NAME:  Chelsea Ramsey, MRN:  [id number], DOB:  07-01-1966, LOS: 3 ADMISSION DATE:  10/19/2022, CONSULTATION DATE:  10/05/2022 REFERRING MD:  Neomia Glass NP  REASON FOR CONSULT:  Hypotension   HPI  56 y.o female with significant PMH of drug-induced gout of left foot (03/01/2018), Anxiety, Arthritis, Asthma,COPD, Coronary artery disease, HFrEF (current EF 35-40%) DM, Fibromyalgia, GERD, Hypertension, Pulmonary HTN,  Rheumatic fever/heart disease, paroxysmal atrial fibrillation on Xarelto, COPD w/ chronic respiratory failure on baseline 3 L, ongoing tobacco use, TIA/CVA, and Sleep apnea. who presented to the ED with chief complaints of generalized weakness shortness of breath and itching.  On review of her chart, patient was recently discharged on 10/17 following admission for acute on chronic CHF exacerbation, transaminitis, AKI and altered mental status.  Per ED reports, since discharge patient has been progressively weak and not able to walk or take her medication as prescribed. Per EMS report, patient fell from her wheelchair but denied hitting her head or LOC.   ED Course: Initial vital signs showed HR of 99 beats/minute, BP  129/93 mm Hg, the RR 21 breaths/minute, and the oxygen saturation  98% on 4L [place] and a temperature of 97.8F (36.9C).   Pertinent Labs/Diagnostics Findings: Chemistry:Na+/ K+: 137/5.5.  Glucose: 64.  BUN/Cr.:50/3.83  AST/ALT: 70/36, bilirubin direct/indirect 4.7/4.3 CBC: WBC: 12.  Hgb/Hct: 8.6/30.7   Other Lab findings:   PCT: 0.69.  Lactic acid: 2.1 negative, Troponin: 51.  BNP: 785.2 Imaging:  CXR> chronic interstitial lung disease with questionable superimposed edema and small left pleural effusion.  CTH> no acute intracranial abnormality  Patient given 30 cc/kg of fluids and started on broad-spectrum antibiotics Vanco cefepime and Flagyl for spectated sepsis.  She was subsequently admitted to hospitalist service. Patient remained hypotensive despite IVF  boluses therefore was started on Levophed.  (Sepsis reassessment completed). PCCM consulted.  Past Medical History  drug-induced gout of left foot (03/01/2018), Anxiety, Arthritis, Asthma,COPD, Coronary artery disease, HFrEF (current EF 35-40%) DM, Fibromyalgia, GERD, Hypertension, Pulmonary HTN,  Rheumatic fever/heart disease, paroxysmal atrial fibrillation on Xarelto, COPD w/ chronic respiratory failure on baseline 3 L, ongoing tobacco use, TIA/CVA, and Sleep apnea  Significant Hospital Events   11/5: Admit to hospitalist service with sepsis of unknown origin and AMS. 11/6: Remained hypotensive despite IVF and started on pressors. PCCM consulted 10/06/22- patient confused with encephalopathy, rectal tube with loose stools 10/07/22- mentation has improved, blood work improved with improved renal function and improved transaminitis, GI and Surgery have seen patient and feel gall bladder can be addressed on outpatient.   Consults:  PCCM Cardiology  Procedures:  None  Significant Diagnostic Tests:  11/5: Chest Xray> 11/5: Noncontrast CT head>  Micro Data:  11/5: SARS-CoV-2 PCR> negative 11/5: Influenza PCR> negative 11/5: Blood culture x2> 11/5: Urine Culture> 11/5: MRSA PCR>>   Antimicrobials:  Vancomycin 11/5>> Cefepime 11/5>>  OBJECTIVE  Blood pressure 93/68, pulse (!) 126, temperature (!) 97.5 F (36.4 C), temperature source Axillary, resp. rate (!) 21, height '5\' 4"'$  (1.626 m), weight 105.9 kg, last menstrual period 11/08/2009, SpO2 91 %.    FiO2 (%):  [45 %-46 %] 45 %   Intake/Output Summary (Last 24 hours) at 10/07/2022 1252 Last data filed at 10/07/2022 1125 Gross per 24 hour  Intake 1096.63 ml  Output 1210 ml  Net -113.37 ml    Filed Weights   10/05/22 1700 10/07/22 0456  Weight: 106.3 kg 105.9 kg    Physical Examination  GENERAL: 56 year-old critically ill patient  lying in the bed with no acute distress.  EYES: Pupils equal, round, reactive to light and  accommodation. No scleral icterus. Extraocular muscles intact.  HEENT: Head atraumatic, normocephalic. Oropharynx and nasopharynx clear.  NECK:  Supple, no jugular venous distention. No thyroid enlargement, no tenderness.  LUNGS: Normal breath sounds bilaterally, no wheezing, rales,rhonchi or crepitation. No use of accessory muscles of respiration.  CARDIOVASCULAR: S1, S2 normal. No murmurs, rubs, or gallops.  ABDOMEN: Soft, nontender, nondistended. Bowel sounds present. No organomegaly or mass.  EXTREMITIES: Upper and lower extremities are atraumatic in appearance without tenderness or deformity. Mild trace edema of bilateral lower extremities. Unable to assess muscle strength i. Capillary refill is less than 3 seconds in all extremities. Pulses palpable.  NEUROLOGIC:The patient is  lethargic but arouses easily  with somewhat slurred speech. Moves all extremities. Sensation is intact bilaterally. Reflexes 2+ bilaterally.  intact. Gait not checked.  PSYCHIATRIC: The patient is drowsy, oriented to self only SKIN: No obvious rash, lesion, or ulcer.   Labs/imaging that I havepersonally reviewed  (right click and "Reselect all SmartList Selections" daily)     Labs   CBC: Recent Labs  Lab 10/01/22 0627 10/05/2022 1602 10/05/22 0306 10/06/22 0215 10/07/22 0547  WBC 7.6 12.0* 12.7* 13.5* 12.8*  NEUTROABS  --  9.4*  --   --   --   HGB 9.1* 8.6* 9.0* 9.2* 8.8*  HCT 31.8* 30.7* 32.5* 32.4* 30.3*  MCV 85.3 88.5 88.8 86.9 86.6  PLT 132* 258 290 307 309     Basic Metabolic Panel: Recent Labs  Lab 10/02/2022 1602 10/05/22 0306 10/06/22 0215 10/06/22 2050 10/07/22 0547  NA 137 137 136 139 139  K 5.5* 5.2* 4.6 4.1 3.8  CL 90* 93* 95* 97* 97*  CO2 34* 32 '30 29 30  '$ GLUCOSE 64* 93 101* 105* 98  BUN 50* 53* 51* 53* 48*  CREATININE 3.83* 3.87* 3.59* 3.29* 3.03*  CALCIUM 9.3 9.0 8.5* 8.8* 8.7*  MG 2.2  --  1.9  --  1.7  PHOS  --   --  5.5*  --  4.6    GFR: Estimated Creatinine Clearance:  24.9 mL/min (A) (by C-G formula based on SCr of 3.03 mg/dL (H)). Recent Labs  Lab 10/05/2022 1602 10/12/2022 1707 10/05/22 0306 10/05/22 1357 10/06/22 0215 10/07/22 0547  PROCALCITON  --  0.69 0.53  --  0.57 0.42  WBC 12.0*  --  12.7*  --  13.5* 12.8*  LATICACIDVEN  --  2.1* 1.8 1.3  --   --      Liver Function Tests: Recent Labs  Lab 10/01/22 0627 10/22/2022 1602 10/05/22 0306 10/06/22 0215 10/07/22 0547  AST 63* 70* 73* 70* 60*  ALT 34 36 40 40 39  ALKPHOS 89 93 91 91 81  BILITOT 7.8* 9.0* 9.4* 9.4* 8.6*  PROT 6.7 6.9 7.1 7.0 6.8  ALBUMIN 2.4* 2.6* 2.8* 2.7* 2.6*    No results for input(s): "LIPASE", "AMYLASE" in the last 168 hours. Recent Labs  Lab 10/05/22 0306  AMMONIA 67*     ABG    Component Value Date/Time   PHART 7.39 10/05/2022 1401   PCO2ART 54 (H) 10/05/2022 1401   PO2ART 76 (L) 10/05/2022 1401   HCO3 32.7 (H) 10/05/2022 1401   ACIDBASEDEF 0.2 12/20/2017 2033   O2SAT 64.5 10/06/2022 0418     Coagulation Profile: Recent Labs  Lab 10/05/22 1419 10/06/22 0215  INR 1.5* 1.8*     Cardiac Enzymes: No results for input(s): "  CKTOTAL", "CKMB", "CKMBINDEX", "TROPONINI" in the last 168 hours.  HbA1C: Hemoglobin A1C  Date/Time Value Ref Range Status  03/08/2020 03:52 PM 6.7 (A) 4.0 - 5.6 % Final  08/21/2014 12:00 AM 5.2  Final  06/03/2012 04:52 AM 4.5 4.2 - 6.3 % Final    Comment:    The American Diabetes Association recommends that a primary goal of therapy should be <7% and that physicians should reevaluate the treatment regimen in patients with HbA1c values consistently >8%.    Hgb A1c MFr Bld  Date/Time Value Ref Range Status  09/15/2022 12:59 PM 4.5 (L) 4.8 - 5.6 % Final    Comment:    (NOTE) Pre diabetes:          5.7%-6.4%  Diabetes:              >6.4%  Glycemic control for   <7.0% adults with diabetes   08/24/2019 04:54 PM 6.2 (H) 4.8 - 5.6 % Final    Comment:    (NOTE)         Prediabetes: 5.7 - 6.4         Diabetes: >6.4          Glycemic control for adults with diabetes: <7.0     CBG: Recent Labs  Lab 10/06/22 1939 10/06/22 2338 10/07/22 0414 10/07/22 0717 10/07/22 1124  GLUCAP 104* 104* 99 96 149*     Review of Systems:   UNABLE TO OBTAIN PATIENT IS ALTERED  Past Medical History  She,  has a past medical history of Acute drug-induced gout of left foot (03/01/2018), Allergy, Anxiety, Arthritis, Asthma, CHF (congestive heart failure) ([place]), COPD (chronic obstructive pulmonary disease) ([place]), Coronary artery disease, Diabetes mellitus without complication ([place]), Dysrhythmia, Fibromyalgia, GERD (gastroesophageal reflux disease), Hypertension, Pneumonia, PUD (peptic ulcer disease), Pulmonary HTN ([place]), Rheumatic fever/heart disease, and Sleep apnea.   Surgical History    Past Surgical History:  Procedure Laterality Date   ADENOIDECTOMY     COLONOSCOPY WITH PROPOFOL N/A 11/06/2019   Procedure: COLONOSCOPY WITH PROPOFOL;  Surgeon: Virgel Manifold, MD;  Location: [facility] ENDOSCOPY;  Service: Endoscopy;  Laterality: N/A;  2 day prep    COLONOSCOPY WITH PROPOFOL N/A 06/29/2022   Procedure: COLONOSCOPY WITH PROPOFOL;  Surgeon: Lin Landsman, MD;  Location: [facility] ENDOSCOPY;  Service: Gastroenterology;  Laterality: N/A;   CORONARY ARTERY BYPASS GRAFT     June 27 2018   ESOPHAGOGASTRODUODENOSCOPY (EGD) WITH PROPOFOL N/A 05/25/2018   Procedure: ESOPHAGOGASTRODUODENOSCOPY (EGD) WITH PROPOFOL;  Surgeon: Lucilla Lame, MD;  Location: [facility] ENDOSCOPY;  Service: Endoscopy;  Laterality: N/A;   ESOPHAGOGASTRODUODENOSCOPY (EGD) WITH PROPOFOL N/A 11/06/2019   Procedure: ESOPHAGOGASTRODUODENOSCOPY (EGD) WITH PROPOFOL;  Surgeon: Virgel Manifold, MD;  Location: [facility] ENDOSCOPY;  Service: Endoscopy;  Laterality: N/A;   ESOPHAGOGASTRODUODENOSCOPY (EGD) WITH PROPOFOL N/A 06/29/2022   Procedure: ESOPHAGOGASTRODUODENOSCOPY (EGD) WITH PROPOFOL;  Surgeon: Lin Landsman, MD;  Location: [facility] ENDOSCOPY;  Service:  Gastroenterology;  Laterality: N/A;   GIVENS CAPSULE STUDY N/A 07/22/2022   Procedure: GIVENS CAPSULE STUDY;  Surgeon: Lin Landsman, MD;  Location: [facility] ENDOSCOPY;  Service: Gastroenterology;  Laterality: N/A;   MITRAL VALVE REPLACEMENT     MULTIPLE TOOTH EXTRACTIONS     TONSILLECTOMY     TOTAL KNEE ARTHROPLASTY Right 09/04/2016   Procedure: TOTAL KNEE ARTHROPLASTY; with lateral release;  Surgeon: Earlie Server, MD;  Location: [place];  Service: Orthopedics;  Laterality: Right;     Social History   reports that she has been smoking cigarettes. She has  been smoking an average of .1 packs per day. She has never used smokeless tobacco. She reports that she does not currently use drugs. She reports that she does not drink alcohol.   Family History   Her family history includes Asthma in her mother; Breast cancer in her maternal aunt; COPD in her mother; Cancer in her mother; Congestive Heart Failure in her father; Rheum arthritis in her mother.   Allergies Allergies  Allergen Reactions   Amiodarone Nausea And Vomiting   Aspirin Swelling   Flexeril [Cyclobenzaprine] Swelling   Trazamine [Trazodone & Diet Manage Prod] Nausea And Vomiting   Codeine Rash   Tramadol Rash     Home Medications  Prior to Admission medications   Medication Sig Start Date End Date Taking? Authorizing Provider  albuterol (VENTOLIN HFA) 108 (90 Base) MCG/ACT inhaler Inhale 1-2 puffs into the lungs every 6 (six) hours as needed for wheezing or shortness of breath.    [provider]  allopurinol (ZYLOPRIM) 100 MG tablet Take 1 tablet (100 mg total) by mouth 2 (two) times daily. 03/05/20   Milinda Pointer, MD  Cholecalciferol (VITAMIN D) 50 MCG (2000 UT) CAPS Take 1 capsule (2,000 Units total) by mouth daily. 06/19/21   Alisa Graff, FNP  colchicine 0.6 MG tablet Take 1 tablet (0.6 mg total) by mouth daily. As directed 03/05/20   Milinda Pointer, MD  diazepam (VALIUM) 10 MG tablet Take 10 mg by mouth  2 (two) times daily as needed for anxiety. 07/16/22   [provider]  docusate sodium (COLACE) 100 MG capsule Take 100 mg by mouth 2 (two) times daily as needed for mild constipation or moderate constipation.    [provider]  empagliflozin (JARDIANCE) 10 MG TABS tablet Take 10 mg by mouth daily.    [provider]  furosemide (LASIX) 20 MG tablet Take 3 tablets (60 mg total) by mouth daily. 10/02/22 11/01/22  Wyvonnia Dusky, MD  gabapentin (NEURONTIN) 600 MG tablet Take 0.5 tablets (300 mg total) by mouth 2 (two) times daily. Patient taking differently: Take 600 mg by mouth 3 (three) times daily. 12/06/18   Vaughan Basta, MD  ipratropium-albuterol (DUONEB) 0.5-2.5 (3) MG/3ML SOLN INHALE THE CONTENTS OF 1 VIAL(3MLS) VIA NEBULIZER 3 TIMES DAILY AS NEEDED 10/24/19   Carles Collet M, PA-C  Iron-FA-B Cmp-C-Biot-Probiotic (FUSION PLUS) CAPS Take 1 capsule by mouth daily. Patient not taking: Reported on 09/16/2022 06/25/22   Lin Landsman, MD  lactulose (CHRONULAC) 10 GM/15ML solution Take 45 mLs (30 g total) by mouth 2 (two) times daily. 10/01/22 10/31/22  Wyvonnia Dusky, MD  metolazone (ZAROXOLYN) 2.5 MG tablet Take 1 tablet (2.5 mg total) by mouth every Monday, Wednesday, and Friday. 10/02/22 11/01/22  Wyvonnia Dusky, MD  metoprolol tartrate (LOPRESSOR) 50 MG tablet Take 1 tablet (50 mg total) by mouth 2 (two) times daily. 10/01/22 10/31/22  Wyvonnia Dusky, MD  nitroGLYCERIN (NITROSTAT) 0.4 MG SL tablet Place 1 tablet (0.4 mg total) under the tongue every 5 (five) minutes as needed for chest pain. 06/19/21   Alisa Graff, FNP  omeprazole (PRILOSEC) 40 MG capsule Take 40 mg by mouth daily.    [provider]  oxyCODONE ER 9 MG C12A Take 9 mg by mouth 2 (two) times daily.    [provider]  potassium chloride (KLOR-CON) 10 MEQ tablet Take 4 tablets (40 mEq total) by mouth 2 (two) times daily. And additional 20mq on metolazone  days Patient taking differently:  Take 40 mEq by mouth See admin instructions. Take 4 tablets (39mq) by mouth twice daily - take an additional 2 tablets (277m) by mouth when taking metolazone 01/05/22   HaAlisa GraffFNP  promethazine (PHENERGAN) 12.5 MG tablet Take 1 tablet (12.5 mg total) by mouth every 8 (eight) hours as needed for nausea or vomiting. Patient taking differently: Take 12.5 mg by mouth 2 (two) times daily as needed for nausea or vomiting. 02/20/20   Bacigalupo, AnDionne BucyMD  rifaximin (XIFAXAN) 550 MG TABS tablet Take 1 tablet (550 mg total) by mouth 2 (two) times daily. 10/01/22 10/31/22  WiWyvonnia DuskyMD  rivaroxaban (XARELTO) 20 MG TABS tablet Take 20 mg by mouth daily with supper.    [provider]  rosuvastatin (CRESTOR) 10 MG tablet Take 10 mg by mouth daily.    [provider]  spironolactone (ALDACTONE) 25 MG tablet Take 0.5 tablets (12.5 mg total) by mouth daily. 10/02/22 11/01/22  WiWyvonnia DuskyMD  Scheduled Meds:  [SDerrill MemoN 10/08/2022] amoxicillin-clavulanate  1 tablet Oral Q12H   Chlorhexidine Gluconate Cloth  6 each Topical Daily   feeding supplement  237 mL Oral TID BM   lactulose  30 g Oral BID   multivitamin with minerals  1 tablet Oral Daily   pantoprazole  80 mg Oral Daily   rifaximin  550 mg Oral BID   rosuvastatin  10 mg Oral Daily   Continuous Infusions:  sodium chloride     furosemide (LASIX) 200 mg in dextrose 5 % 100 mL (2 mg/mL) infusion 4 mg/hr (10/07/22 1125)   heparin 1,350 Units/hr (10/07/22 1125)   norepinephrine (LEVOPHED) Adult infusion 6 mcg/min (10/07/22 1125)   piperacillin-tazobactam (ZOSYN)  IV Stopped (10/07/22 0944)   vasopressin 0.03 Units/min (10/07/22 1125)   PRN Meds:.acetaminophen **OR** acetaminophen, docusate sodium, fentaNYL (SUBLIMAZE) injection, haloperidol lactate, hydrALAZINE, LORazepam, melatonin, nitroGLYCERIN, senna-docusate  Active Hospital Problem list     Assessment & Plan:  Acute  on Chronic  Hypoxic Respiratory Failure secondary to Acute on chronic Decompensated HFrEF, AECOPD and ?underlying Pneumonia PMHx: COPD w/ chronic respiratory failure on baseline 3 L, ongoing tobacco use , OSA on CPAP -Supplemental O2 as needed to maintain O2 saturations 88 to 92% -Consider NIPPV/BiPAP, wean as tolerated -High risk for intubation -Follow intermittent ABG and chest x-ray as needed -As needed bronchodilators -Encourage smoking cessation  Sepsis with septic shock -urinary source - PRESENT ON ADMISSION Lactic: 2.1, Baseline PCT: 0.69, UA:? UTI , CXR: ? Pneumonia Initial interventions/workup included: 3 L of NS/LR & Cefepime/ Vancomycin/ meets SIRS criteria: Shock Index (SI)2 -Supplemental oxygen as needed, to maintain SpO2 > 90% -F/u cultures, trend lactic/ PCT -Monitor WBC/ fever curve -IV antibiotics: cefepime & vancomycin -Gentle IVF hydration as needed -Pressors for MAP goal >65 -Strict I/O's   Acute on Chronic Decompensated HFrEF (current EF 20-25%) PAF PMHx: CAD, HLD, HTNsevere pulmonary hypertension, RV dysfunction,Rheumatic mitral valve stenosis s/p MVR 2019  -BNP on admission was elevated at 558.5 with small probable interstitial edema and small left pleural effusion on cxr. -Continuous cardiac monitoring -Diuresis as BP and renal function permits ~ currently holding due to AKI and hypotension -Hold metoprolol and Spironolactone for now -Hold Xarelto -Continue midodrine -Cardiology consult if appropriate   AKI on CKD Stage III Hyperkalemia -Monitor I&O's / urinary output -Follow BMP -Ensure adequate renal perfusion -Avoid nephrotoxic agents as able -Replace electrolytes as indicated   Acute Metabolic Encephalopathy ~ Hx: TIA/CVA, polypharmacy on multiple sedating meds -Provide  supportive care -Avoid sedating meds as able -CT Head  negative for acute intracranial abnormality   Diabetes mellitus HgbA1c  -CBG's Q4; Target range of 140 to  180 -SSI -Follow ICU Hypo/Hyperglycemia protocol   Best practice:  Diet:  NPO Pain/Anxiety/Delirium protocol (if indicated): No VAP protocol (if indicated): Not indicated DVT prophylaxis: Subcutaneous Heparin GI prophylaxis: PPI Glucose control:  SSI Yes Central venous access:  N/A Arterial line:  N/A Foley:  N/A Mobility:  bed rest  PT consulted: N/A Last date of multidisciplinary goals of care discussion [11/6] Code Status:  DNR Disposition: Stepdown   = Goals of Care = Code Status Order: DNR  Primary Emergency Contact: Murdock,[name] Patient wishes to pursue ongoing treatment, but concurred that if deteriorated to pulselessness, patient would prefer a natural death as opposed to invasive measures such as CPR and intubation.  Family should be immediately contacted in such situations.  Critical care provider statement:   Total critical care time: 33 minutes   Performed by: Lanney Gins MD   Critical care time was exclusive of separately billable procedures and treating other patients.   Critical care was necessary to treat or prevent imminent or life-threatening deterioration.   Critical care was time spent personally by me on the following activities: development of treatment plan with patient and/or surrogate as well as nursing, discussions with consultants, evaluation of patient's response to treatment, examination of patient, obtaining history from patient or surrogate, ordering and performing treatments and interventions, ordering and review of laboratory studies, ordering and review of radiographic studies, pulse oximetry and re-evaluation of patient's condition.    Ottie Glazier, M.D.  Pulmonary & Critical Care Medicine

## 2022-10-07 NOTE — Consult Note (Signed)
[place] for heparin gtt Indication: atrial fibrillation  Patient Measurements: Height: '5\' 4"'$  (162.6 cm) Weight: 105.9 kg (233 lb 7.5 oz) IBW/kg (Calculated) : 54.7 Heparin Dosing Weight: 79.8 kg  Labs: Recent Labs    10/29/2022 1602 10/26/2022 1707 10/05/22 0306 10/05/22 0306 10/05/22 1419 10/06/22 0215 10/06/22 0418 10/06/22 1219 10/06/22 2050 10/07/22 0547  HGB 8.6*  --  9.0*  --   --  9.2*  --   --   --  8.8*  HCT 30.7*  --  32.5*  --   --  32.4*  --   --   --  30.3*  PLT 258  --  290  --   --  307  --   --   --  309  APTT  --   --   --   --   --  87*   < > 112* 74* 159*  LABPROT  --   --   --   --  18.0* 20.8*  --   --   --   --   INR  --   --   --   --  1.5* 1.8*  --   --   --   --   HEPARINUNFRC  --   --   --    < >  --  0.27*  --  0.32 0.14* 0.48  CREATININE 3.83*  --  3.87*  --   --  3.59*  --   --  3.29* 3.03*  TROPONINIHS 51* 49*  --   --   --   --   --   --   --   --    < > = values in this interval not displayed.    Estimated Creatinine Clearance: 24.9 mL/min (A) (by C-G formula based on SCr of 3.03 mg/dL (H)).  Medications:  Heparin Dosing Weight: 79.8 kg PTA: Xarelto 20 QHS (last dose on 11/03 PTA) Inpatient: [facility] 5k Q8h  >> heparin gtt   Assessment: 56yo F w/ h/o drug and alcohol abuse, HTN, HLD, MDD and AFib, who was admitted to [facility] on 10/25/2022 for severe sepsis with acute organ dysfunction. Pharmacy consulted to transition of prophylactic heparin to infusion. Pt xarelto for Afib PTA is on hold ISO severe AKI until renal fxn normalizes.  Baseline Labs: Hgb - 9.0; Plts - 290  Goal of Therapy:  Heparin level 0.3-0.7 units/ml aPTT 66-102 seconds Monitor platelets by anticoagulation protocol: Yes   Plan:  --aPTT and HL are not correlating; note HL is therapeutic and aPTT is supratherapeutic. There does not appear to be any significant impact of residual Xarelto on HL. Baseline aPTT was not collected so difficult to  interpret. Suspect hepatic congestion issues contributing --Given therapeutic heparin level and lack of residual Xarelto will continue heparin infusion at 1350 units/hr for now --Will re-check aPTT and HL in 8 hours. Plan to follow HL primarily, but want to check aPTT to ensure stabilization given trend --Daily CBC per protocol; monitor closely for bleeding given coagulopathy  Dallie Piles 10/07/2022 3:45 PM

## 2022-10-07 NOTE — Progress Notes (Signed)
[place] NOTE       Patient ID: Chelsea Ramsey MRN: [id number] DOB/AGE: 04-Jan-1966 56 y.o.  Admit date: 10/17/2022 Referring Physician Darel Hong, NP  Primary Physician Dr. Gracy Racer Primary Cardiologist Dr. 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  She was admitted to stepdown and is being treated for septic vs cardiogenic shock with suspected urinary source, confounded by acute on chronic heart failure.  Cardiology is consulted for further assistance.  Interval History:   -Still requiring levo and vasopressin. Bps by A line are high 50K-938 systolic.  In A-fib with RVR rate steady in the 120s with some paroxysms to the 130s. -Primary complaint is wanting to eat, tearful because of this.  On HFNC that was started last night.  Remains restless.  No chest pain, abdominal pain.  Shortness of breath and peripheral edema persistent. -T. bili, renal function and urine output slightly improving after initiation of Lasix infusion yesterday -Seen by GI yesterday out of concern for hepatic dysfunction/hyperbilirubinemia.  Nuclear hepatobiliary scan was not consistent with acute cholecystitis, favoring liver dysfunction.   Review of systems complete and found to be negative unless listed above     Past Medical History:  Diagnosis Date   Acute drug-induced gout of left foot 03/01/2018   Last Assessment & Plan:  Likely at least partially brought on by  diuresis.  Needs to continue to diurese  Will push hydration Stop allopurinol given initiation during acute flare may worsen this, re-broach this when asymptomatic Avoid nsaids given stomach pain Trial colchicine Add acetaminophen Ice, elevate, rest   Allergy    seasonal   Anxiety    Arthritis    Right Knee   Asthma    CHF (congestive heart failure) (HCC)    COPD (chronic obstructive pulmonary disease) (HCC)    Coronary artery disease    Leaky heart valve   Diabetes mellitus without complication (HCC)    Dysrhythmia    Fibromyalgia    GERD (gastroesophageal reflux disease)    Hypertension    Pneumonia    PUD (peptic ulcer disease)    Pulmonary HTN (HCC)    Rheumatic fever/heart disease    Sleep apnea     Past Surgical History:  Procedure Laterality Date   ADENOIDECTOMY     COLONOSCOPY WITH PROPOFOL N/A 11/06/2019   Procedure: COLONOSCOPY WITH PROPOFOL;  Surgeon: Virgel Manifold, MD;  Location: [facility] ENDOSCOPY;  Service: Endoscopy;  Laterality: N/A;  2 day prep    COLONOSCOPY WITH PROPOFOL N/A 06/29/2022   Procedure: COLONOSCOPY WITH PROPOFOL;  Surgeon: Lin Landsman, MD;  Location: [facility] ENDOSCOPY;  Service: Gastroenterology;  Laterality: N/A;   CORONARY ARTERY BYPASS GRAFT     June 27 2018   ESOPHAGOGASTRODUODENOSCOPY (EGD) WITH PROPOFOL N/A 05/25/2018   Procedure: ESOPHAGOGASTRODUODENOSCOPY (EGD) WITH PROPOFOL;  Surgeon: Lucilla Lame, MD;  Location: [facility] ENDOSCOPY;  Service: Endoscopy;  Laterality: N/A;   ESOPHAGOGASTRODUODENOSCOPY (EGD) WITH PROPOFOL N/A 11/06/2019   Procedure: ESOPHAGOGASTRODUODENOSCOPY (EGD) WITH PROPOFOL;  Surgeon: Bonna Gains,  Lennette Bihari, MD;  Location: [facility] ENDOSCOPY;  Service: Endoscopy;  Laterality: N/A;   ESOPHAGOGASTRODUODENOSCOPY (EGD) WITH PROPOFOL N/A 06/29/2022   Procedure: ESOPHAGOGASTRODUODENOSCOPY (EGD) WITH PROPOFOL;  Surgeon: Lin Landsman, MD;  Location: [facility] ENDOSCOPY;  Service: Gastroenterology;  Laterality: N/A;   GIVENS CAPSULE  STUDY N/A 07/22/2022   Procedure: GIVENS CAPSULE STUDY;  Surgeon: Lin Landsman, MD;  Location: [name] ENDOSCOPY;  Service: Gastroenterology;  Laterality: N/A;   MITRAL VALVE REPLACEMENT     MULTIPLE TOOTH EXTRACTIONS     TONSILLECTOMY     TOTAL KNEE ARTHROPLASTY Right 09/04/2016   Procedure: TOTAL KNEE ARTHROPLASTY; with lateral release;  Surgeon: Earlie Server, MD;  Location: [place];  Service: Orthopedics;  Laterality: Right;    Medications Prior to Admission  Medication Sig Dispense Refill Last Dose   albuterol (VENTOLIN HFA) 108 (90 Base) MCG/ACT inhaler Inhale 1-2 puffs into the lungs every 6 (six) hours as needed for wheezing or shortness of breath.   prn   allopurinol (ZYLOPRIM) 100 MG tablet Take 1 tablet (100 mg total) by mouth 2 (two) times daily. 60 tablet 5 10/02/2022   Cholecalciferol (VITAMIN D) 50 MCG (2000 UT) CAPS Take 1 capsule (2,000 Units total) by mouth daily. 30 capsule 5 10/02/2022   colchicine 0.6 MG tablet Take 1 tablet (0.6 mg total) by mouth daily. As directed 30 tablet 5 prn   diazepam (VALIUM) 10 MG tablet Take 10 mg by mouth 2 (two) times daily as needed for anxiety.   prn   docusate sodium (COLACE) 100 MG capsule Take 100 mg by mouth 2 (two) times daily as needed for mild constipation or moderate constipation.   prn   empagliflozin (JARDIANCE) 10 MG TABS tablet Take 10 mg by mouth daily.   10/02/2022   furosemide (LASIX) 20 MG tablet Take 3 tablets (60 mg total) by mouth daily. 90 tablet 0 10/02/2022   gabapentin (NEURONTIN) 600 MG tablet Take 0.5 tablets (300 mg total) by mouth 2 (two) times daily. (Patient taking differently: Take 600 mg by mouth 3 (three) times daily.) 60 tablet 0 prn   ipratropium-albuterol (DUONEB) 0.5-2.5 (3) MG/3ML SOLN INHALE THE CONTENTS OF 1 VIAL(3MLS) VIA NEBULIZER 3 TIMES DAILY AS NEEDED 360 mL 1 prn   Iron-FA-B Cmp-C-Biot-Probiotic (FUSION PLUS) CAPS Take 1 capsule by mouth daily. (Patient not taking: Reported on 09/16/2022) 30 capsule 3     lactulose (CHRONULAC) 10 GM/15ML solution Take 45 mLs (30 g total) by mouth 2 (two) times daily. 2700 mL 0 10/02/2022   metolazone (ZAROXOLYN) 2.5 MG tablet Take 1 tablet (2.5 mg total) by mouth every Monday, Wednesday, and Friday. 12 tablet 0 10/02/2022   metoprolol tartrate (LOPRESSOR) 50 MG tablet Take 1 tablet (50 mg total) by mouth 2 (two) times daily. 60 tablet 0 10/02/2022   nitroGLYCERIN (NITROSTAT) 0.4 MG SL tablet Place 1 tablet (0.4 mg total) under the tongue every 5 (five) minutes as needed for chest pain. 30 tablet 12 prn   omeprazole (PRILOSEC) 40 MG capsule Take 40 mg by mouth daily.   10/02/2022   oxyCODONE ER 9 MG C12A Take 9 mg by mouth 2 (two) times daily.   10/02/2022   potassium chloride (KLOR-CON) 10 MEQ tablet Take 4 tablets (40 mEq total) by mouth 2 (two) times daily. And additional 82mq on metolazone days (Patient taking differently: Take 40 mEq by mouth See admin instructions. Take 4 tablets (45m) by mouth twice daily - take an additional 2 tablets (2049m by mouth when taking  metolazone) 64 tablet 5 10/02/2022   promethazine (PHENERGAN) 12.5 MG tablet Take 1 tablet (12.5 mg total) by mouth every 8 (eight) hours as needed for nausea or vomiting. (Patient taking differently: Take 12.5 mg by mouth 2 (two) times daily as needed for nausea or vomiting.) 30 tablet 0 prn   rifaximin (XIFAXAN) 550 MG TABS tablet Take 1 tablet (550 mg total) by mouth 2 (two) times daily. 60 tablet 0 10/02/2022   rivaroxaban (XARELTO) 20 MG TABS tablet Take 20 mg by mouth daily with supper.   10/02/2022   rosuvastatin (CRESTOR) 10 MG tablet Take 10 mg by mouth daily.   10/02/2022   spironolactone (ALDACTONE) 25 MG tablet Take 0.5 tablets (12.5 mg total) by mouth daily. 15 tablet 0 10/02/2022   Social History   Socioeconomic History   Marital status: Married    Spouse name: Not on file   Number of children: Not on file   Years of education: Not on file   Highest education level: Not on file   Occupational History   Occupation: disabled  Tobacco Use   Smoking status: Every Day    Packs/day: 0.10    Types: Cigarettes   Smokeless tobacco: Never   Tobacco comments:    4/20 was not able to quit.  Still at 1-3 a day. She would like to wait to set a new quit date until we get back to class to have the extra in person support  Vaping Use   Vaping Use: Never used  Substance and Sexual Activity   Alcohol use: Never    Alcohol/week: 3.0 standard drinks of alcohol    Types: 3 Cans of beer per week    Comment: 16 oz per week   Drug use: Not Currently    Comment: Previous use of cocaine and marijuana last use 07/31/16    Sexual activity: Not Currently  Other Topics Concern   Not on file  Social History Narrative   Not on file   Social Determinants of Health   Financial Resource Strain: Low Risk  (10/02/2019)   Overall Financial Resource Strain (CARDIA)    Difficulty of Paying Living Expenses: Not hard at all  Food Insecurity: No Food Insecurity (09/18/2022)   Hunger Vital Sign    Worried About Running Out of Food in the Last Year: Never true    Ran Out of Food in the Last Year: Never true  Transportation Needs: No Transportation Needs (09/18/2022)   PRAPARE - Hydrologist (Medical): No    Lack of Transportation (Non-Medical): No  Physical Activity: Insufficiently Active (10/02/2019)   Exercise Vital Sign    Days of Exercise per Week: 7 days    Minutes of Exercise per Session: 20 min  Stress: No Stress Concern Present (10/02/2019)   [place]    Feeling of Stress : Not at all  Social Connections: Moderately Integrated (10/02/2019)   Social Connection and Isolation Panel [NHANES]    Frequency of Communication with Friends and Family: More than three times a week    Frequency of Social Gatherings with Friends and Family: More than three times a week    Attends Religious Services: 1 to  4 times per year    Active Member of [employer] or Organizations: Yes    Attends Archivist Meetings: 1 to 4 times per year    Marital Status: Never married  Intimate Partner Violence: Not At Risk (  09/18/2022)   Humiliation, Afraid, Rape, and Kick questionnaire    Fear of Current or Ex-Partner: No    Emotionally Abused: No    Physically Abused: No    Sexually Abused: No    Family History  Problem Relation Age of Onset   COPD Mother    Cancer Mother        Bone   Asthma Mother    Rheum arthritis Mother    Congestive Heart Failure Father    Breast cancer Maternal Aunt      Vitals:   10/07/22 0945 10/07/22 1000 10/07/22 1015 10/07/22 1023  BP:      Pulse: (!) 138 (!) 139 (!) 133 (!) 135  Resp: (!) 27 (!) 26 (!) 26 (!) 22  Temp:      TempSrc:      SpO2: 96% 92% 94% 91%  Weight:      Height:        PHYSICAL EXAM General: Acute on chronically ill-appearing black female, in no acute distress.  Laying on her right side in ICU bed, safety mitts in place. HEENT:  Normocephalic and atraumatic. Neck:  + JVD.  Lungs: Short of breath appearing on HFNC.  Decreased breath sounds bilaterally with coarseness to auscultation.   Heart: Tachycardic irregularly irregular rhythm. Normal S1 and S2 without gallops.  2/6 systolic murmur best heard at the RUSB.  Abdomen: Non-distended appearing with excess adiposity and a large pannus.  Msk: Generalized weakness Extremities: No clubbing, cyanosis.  2+ bilateral lower extremity edema, feet cool to touch.  Neuro: Awake and alert Psych: Dysphoric mood.  Labs: Basic Metabolic Panel: Recent Labs    10/06/22 0215 10/06/22 2050 10/07/22 0547  NA 136 139 139  K 4.6 4.1 3.8  CL 95* 97* 97*  CO2 '30 29 30  '$ GLUCOSE 101* 105* 98  BUN 51* 53* 48*  CREATININE 3.59* 3.29* 3.03*  CALCIUM 8.5* 8.8* 8.7*  MG 1.9  --  1.7  PHOS 5.5*  --  4.6    Liver Function Tests: Recent Labs    10/06/22 0215 10/07/22 0547  AST 70* 60*  ALT 40 39   ALKPHOS 91 81  BILITOT 9.4* 8.6*  PROT 7.0 6.8  ALBUMIN 2.7* 2.6*    No results for input(s): "LIPASE", "AMYLASE" in the last 72 hours. CBC: Recent Labs    10/21/2022 1602 10/05/22 0306 10/06/22 0215 10/07/22 0547  WBC 12.0*   < > 13.5* 12.8*  NEUTROABS 9.4*  --   --   --   HGB 8.6*   < > 9.2* 8.8*  HCT 30.7*   < > 32.4* 30.3*  MCV 88.5   < > 86.9 86.6  PLT 258   < > 307 309   < > = values in this interval not displayed.    Cardiac Enzymes: Recent Labs    10/05/2022 1602 10/23/2022 1707  TROPONINIHS 51* 49*    BNP: Recent Labs    10/17/2022 1602  BNP 785.2*    D-Dimer: No results for input(s): "DDIMER" in the last 72 hours. Hemoglobin A1C: No results for input(s): "HGBA1C" in the last 72 hours. Fasting Lipid Panel: No results for input(s): "CHOL", "HDL", "LDLCALC", "TRIG", "CHOLHDL", "LDLDIRECT" in the last 72 hours. Thyroid Function Tests: Recent Labs    10/05/22 0306  TSH 8.916*    Anemia Panel: Recent Labs    10/05/22 0306  VITAMINB12 1,380*      Radiology: NM Hepatobiliary Liver Func  Result Date: 10/06/2022 CLINICAL DATA:  Hyperbilirubinemia.  Concern for cholecystitis. EXAM: NUCLEAR MEDICINE HEPATOBILIARY IMAGING TECHNIQUE: Sequential images of the abdomen were obtained out to 60 minutes following intravenous administration of radiopharmaceutical. RADIOPHARMACEUTICALS:  7.3 mCi Tc-70m Choletec IV COMPARISON:  Ultrasound 10/05/2022 FINDINGS: There is delayed clearance radiotracer from the blood pool. Delayed clearance of radiotracer from the liver into the bile ducts. The gallbladder begins to fill at 60 minutes. The gallbladder is elongated nondistended. No counts evident within common bile duct or small bowel over the course the study. IMPRESSION: 1. Filling of the gallbladder consistent with patent cystic duct and NOT consistent with acute cholecystitis. 2. Poor clearance radiotracer from blood pool and poor clearance of radiotracer from the liver.  Favor liver dysfunction over biliary obstruction. The common bile duct is normal on ultrasound 1 day prior. The gallbladder is nondistended. Electronically Signed   By: SSuzy BouchardM.D.   On: 10/06/2022 16:52   DG Chest Port 1 View  Result Date: 10/05/2022 CLINICAL DATA:  Evaluate central venous catheter placement EXAM: PORTABLE CHEST 1 VIEW COMPARISON:  10/03/2022 FINDINGS: Signs of previous median sternotomy. 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 There is a right IJ catheter with tip in the projection of the SVC. No pneumothorax identified. Cardiac enlargement is unchanged. Unchanged appearance of diffuse reticular interstitial opacities throughout both lungs. No sign of pleural effusion. IMPRESSION: Right IJ catheter with tip in the projection of the SVC. No pneumothorax. No change in diffuse increase interstitial opacities which may reflect interstitial edema, atypical infection or chronic lung disease. Electronically Signed   By: TKerby MoorsM.D.   On: 10/05/2022 14:14   UKoreaAbdomen Limited RUQ (LIVER/GB)  Result Date: 10/05/2022 CLINICAL DATA:  Hyperbilirubinemia EXAM: ULTRASOUND ABDOMEN LIMITED RIGHT UPPER QUADRANT COMPARISON:  MRI and ultrasound October of 2023. FINDINGS: Gallbladder: No gallstones. No Murphy sign. Small amount of gallbladder sludge. Mild gallbladder wall thickening, 4.3 mm. The differential diagnosis is hydrops due to poor right heart function versus acalculous cholecystitis peer Common bile duct: Diameter: 4.2 mm.  No visible stone. Liver: No focal lesion. Increased echogenicity suggesting steatosis. Trace amount of ascites. Portal vein is patent on color Doppler imaging with to and fro flow directionality. Other: None. IMPRESSION: 1. Small amount of gallbladder sludge. No gallstones. Mild gallbladder wall thickening. The differential diagnosis is hydrops due to poor right heart function versus acalculous cholecystitis. 2. Hepatic steatosis. Trace amount of  ascites. 3. To and fro flow directionality in the portal vein. This constellation of findings is consistent with chronic hepatic parenchymal disease. Electronically Signed   By: MNelson ChimesM.D.   On: 10/05/2022 09:24   CT HEAD WO CONTRAST (5MM)  Result Date: 10/15/2022 CLINICAL DATA:  Mental status change EXAM: CT HEAD WITHOUT CONTRAST TECHNIQUE: Contiguous axial images were obtained from the base of the skull through the vertex without intravenous contrast. RADIATION DOSE REDUCTION: This exam was performed according to the departmental dose-optimization program which includes automated exposure control, adjustment of the mA and/or kV according to patient size and/or use of iterative reconstruction technique. COMPARISON:  MRI brain February 01, 2022 and head CT January 31, 2020 FINDINGS: Brain: Tiny remote left cerebellar infarct. No evidence of acute infarction, hemorrhage, hydrocephalus, extra-axial collection or mass lesion/mass effect. Vascular: No hyperdense vessel or unexpected calcification. Skull: Similar appearance of the left inferior orbital wall fracture with herniation of fat through the fracture defect. Negative for acute fracture or focal lesion. Sinuses/Orbits: Visualized portions of the paranasal sinuses predominantly clear. Other: None  IMPRESSION: 1. No acute intracranial abnormality. 2. Tiny remote left cerebellar infarct. 3. Similar appearance of the left inferior orbital wall fracture with herniation of fat through the fracture defect. Electronically Signed   By: Dahlia Bailiff M.D.   On: 10/01/2022 19:14   DG Chest Port 1 View  Result Date: 10/06/2022 CLINICAL DATA:  Shortness of breath. EXAM: PORTABLE CHEST 1 VIEW COMPARISON:  09/25/2022 FINDINGS: Stable enlarged cardiac silhouette, median sternotomy wires, prosthetic mitral valve and left atrial clip. Stable prominence of the interstitial markings with no Kerley lines. Possible small left pleural effusion. Thoracic spine degenerative  changes. IMPRESSION: 1. Stable cardiomegaly and chronic interstitial lung disease with possible superimposed interstitial edema. 2. Possible small left pleural effusion. Electronically Signed   By: Claudie Revering M.D.   On: 10/12/2022 16:06   DG Chest Port 1 View  Result Date: 09/25/2022 CLINICAL DATA:  Shortness of breath, renal failure. EXAM: PORTABLE CHEST 1 VIEW COMPARISON:  Chest radiograph dated 09/16/2022. FINDINGS: The heart is enlarged. Mild-to-moderate bilateral interstitial and airspace opacities are redemonstrated. There is no pleural effusion or pneumothorax. Degenerative changes are seen in the spine. A mitral valve prosthesis and atrial appendage clip, and median sternotomy wires are redemonstrated. IMPRESSION: Cardiomegaly with mild-to-moderate bilateral interstitial and airspace opacities, likely pulmonary edema. Electronically Signed   By: Zerita Boers M.D.   On: 09/25/2022 10:23   ECHOCARDIOGRAM LIMITED  Result Date: 09/23/2022    ECHOCARDIOGRAM LIMITED REPORT   Patient Name:   NIVEAH BOERNER Date of Exam: 09/23/2022 Medical Rec #:  [id number]                 Height:       64.0 in Accession #:    [id number]                Weight:       243.4 lb Date of Birth:  08/29/1966                 BSA:          2.127 m Patient Age:    72 years                  BP:           109/71 mmHg Patient Gender: F                         HR:           96 bpm. Exam Location:  [facility] Procedure: Limited Echo Indications:     CHF I50.9  History:         Patient has prior history of Echocardiogram examinations, most                  recent 09/17/2022. CHF, Prior Cardiac Surgery, COPD; Risk                  Factors:Hypertension.                   Mitral Valve: bioprosthetic valve valve is present in the                  mitral position.  Sonographer:     Sherrie Sport Referring Phys:  [id number] [place] Taneeka Curtner Diagnosing Phys: Serafina Royals MD  Sonographer Comments: Technically difficult study due to poor  echo windows, suboptimal apical window and suboptimal parasternal window. IMPRESSIONS  1. Left ventricular ejection fraction, by  estimation, is 35 to 40%. The left ventricle has moderately decreased function. The left ventricle demonstrates global hypokinesis. The left ventricular internal cavity size was mildly dilated.  2. There is LV septal flattenting in systole and diastole and dilated hepatic vein consistant with severe phtn. Right ventricular systolic function is moderately reduced. The right ventricular size is moderately enlarged. Mildly increased right ventricular wall thickness. There is severely elevated pulmonary artery systolic pressure.  3. Left atrial size was severely dilated.  4. Right atrial size was severely dilated.  5. Cannot rule out mild stenosis. doppler difficult to interpret. The mitral valve is degenerative. Mild mitral valve regurgitation. 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  6. The tricuspid valve is abnormal. Tricuspid valve regurgitation is severe.  7. The aortic valve is calcified. Aortic valve regurgitation is moderate. FINDINGS  Left Ventricle: Left ventricular ejection fraction, by estimation, is 35 to 40%. The left ventricle has moderately decreased function. The left ventricle demonstrates global hypokinesis. The left ventricular internal cavity size was mildly dilated. Right Ventricle: There is LV septal flattenting in systole and diastole and dilated hepatic vein consistant with severe phtn. The right ventricular size is moderately enlarged. Mildly increased right ventricular wall thickness. Right ventricular systolic  function is moderately reduced. There is severely elevated pulmonary artery systolic pressure. Left Atrium: Left atrial size was severely dilated. Right Atrium: Right atrial size was severely dilated. Pericardium: There is no evidence of pericardial effusion. Mitral Valve: Cannot rule out mild stenosis. doppler difficult to interpret.  The mitral valve is degenerative in appearance. Mild mitral valve regurgitation. 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 MV peak gradient, 19.2 mmHg. The mean mitral valve gradient is 11.0 mmHg. Tricuspid Valve: The tricuspid valve is abnormal. Tricuspid valve regurgitation is severe. Aortic Valve: The aortic valve is calcified. Aortic valve regurgitation is moderate. Pulmonic Valve: The pulmonic valve was normal in structure. Pulmonic valve regurgitation is mild. Aorta: The aortic root and ascending aorta are structurally normal, with no evidence of dilitation. IAS/Shunts: No atrial level shunt detected by color flow Doppler. LEFT VENTRICLE PLAX 2D LVIDd:         4.70 cm LVIDs:         3.10 cm LV PW:         0.90 cm LV IVS:        0.90 cm  RIGHT VENTRICLE RV Basal diam:  5.00 cm RV Mid diam:    6.50 cm LEFT ATRIUM         Index LA diam:    6.50 cm 3.06 cm/m   AORTA Ao Root diam: 2.90 cm MITRAL VALVE                TRICUSPID VALVE MV Area (PHT): 2.63 cm     TR Peak grad:   53.0 mmHg MV Peak grad:  19.2 mmHg    TR Vmax:        364.00 cm/s MV Mean grad:  11.0 mmHg MV Vmax:       2.19 m/s MV Vmean:      161.0 cm/s MV Decel Time: 288 msec MV E velocity: 239.00 cm/s Serafina Royals MD Electronically signed by Serafina Royals MD Signature Date/Time: 09/23/2022/4:02:55 PM    Final    ECHOCARDIOGRAM COMPLETE  Result Date: 09/17/2022    ECHOCARDIOGRAM REPORT   Patient Name:   Chelsea Ramsey Date of Exam: 09/17/2022 Medical Rec #:  [id number]  Height:       64.0 in Accession #:    [id number]                Weight:       265.0 lb Date of Birth:  [birth year]                 BSA:          2.205 m Patient Age:    44 years                  BP:           93/79 mmHg Patient Gender: F                         HR:           110 bpm. Exam Location:  [facility] Procedure: 2D Echo, Color Doppler and Cardiac Doppler Indications:     I50.31 CHF-Acute Diastolic  History:         Patient has prior  history of Echocardiogram examinations, most                  recent 11/05/2018. CHF, CAD, COPD; Risk Factors:Hypertension,                  Diabetes and Sleep Apnea.  Sonographer:     Charmayne Sheer Referring Phys:  [id number] [place] Shina Wass Diagnosing Phys: Yolonda Kida MD  Sonographer Comments: Suboptimal apical window and no subcostal window. Image acquisition challenging due to patient body habitus and Image acquisition challenging due to COPD. IMPRESSIONS  1. Left ventricular ejection fraction, by estimation, is 20 to 25%. The left ventricle has severely decreased function. The left ventricle demonstrates global hypokinesis. Left ventricular diastolic parameters are consistent with Grade III diastolic dysfunction (restrictive). There is the interventricular septum is flattened in systole and diastole, consistent with right ventricular pressure and volume overload.  2. Right ventricular systolic function is moderately reduced. The right ventricular size is moderately enlarged. Mildly increased right ventricular wall thickness.  3. Left atrial size was moderately dilated.  4. Right atrial size was severely dilated.  5. The mitral valve is abnormal. Mild to moderate mitral valve regurgitation.  6. Tricuspid valve regurgitation is severe.  7. The aortic valve is calcified. Aortic valve regurgitation is mild to moderate. Aortic valve sclerosis/calcification is present, without any evidence of aortic stenosis. Conclusion(s)/Recommendation(s): Poor windows for evaluation of left ventricular function by transthoracic echocardiography. Would recommend an alternative means of evaluation. FINDINGS  Left Ventricle: Left ventricular ejection fraction, by estimation, is 20 to 25%. The left ventricle has severely decreased function. The left ventricle demonstrates global hypokinesis. The left ventricular internal cavity size was normal in size. There is borderline concentric left ventricular hypertrophy. The  interventricular septum is flattened in systole and diastole, consistent with right ventricular pressure and volume overload. Left ventricular diastolic parameters are consistent with Grade III diastolic dysfunction (restrictive). Right Ventricle: The right ventricular size is moderately enlarged. Mildly increased right ventricular wall thickness. Right ventricular systolic function is moderately reduced. Left Atrium: Left atrial size was moderately dilated. Right Atrium: Right atrial size was severely dilated. Pericardium: There is no evidence of pericardial effusion. Mitral Valve: The mitral valve is abnormal. Mild to moderate mitral valve regurgitation. Tricuspid Valve: The tricuspid valve is grossly normal. Tricuspid valve regurgitation is severe. Aortic Valve: The aortic valve is calcified. Aortic valve regurgitation is mild to moderate. Aortic regurgitation  PHT measures 233 msec. Aortic valve sclerosis/calcification is present, without any evidence of aortic stenosis. Aortic valve mean gradient measures 6.0 mmHg. Aortic valve peak gradient measures 11.3 mmHg. Aortic valve area, by VTI measures 1.14 cm. Pulmonic Valve: The pulmonic valve was normal in structure. Pulmonic valve regurgitation is mild to moderate. Aorta: The ascending aorta was not well visualized. IAS/Shunts: No atrial level shunt detected by color flow Doppler.  LEFT VENTRICLE PLAX 2D LVIDd:         3.50 cm LVIDs:         3.20 cm LV PW:         1.20 cm LV IVS:        0.90 cm LVOT diam:     1.80 cm LV SV:         25 LV SV Index:   12 LVOT Area:     2.54 cm  RIGHT VENTRICLE RV Basal diam:  5.30 cm RV Mid diam:    5.00 cm RV S prime:     7.62 cm/s LEFT ATRIUM         Index       RIGHT ATRIUM           Index LA diam:    5.50 cm 2.49 cm/m  RA Area:     28.50 cm                                 RA Volume:   104.00 ml 47.17 ml/m  AORTIC VALVE                     PULMONIC VALVE AV Area (Vmax):    0.87 cm      PV Vmax:          0.98 m/s AV Area  (Vmean):   0.90 cm      PV Vmean:         69.200 cm/s AV Area (VTI):     1.14 cm      PV VTI:           0.128 m AV Vmax:           168.00 cm/s   PV Peak grad:     3.9 mmHg AV Vmean:          116.000 cm/s  PV Mean grad:     2.5 mmHg AV VTI:            0.223 m       PR End Diast Vel: 10.24 msec AV Peak Grad:      11.3 mmHg AV Mean Grad:      6.0 mmHg LVOT Vmax:         57.60 cm/s LVOT Vmean:        40.900 cm/s LVOT VTI:          0.100 m LVOT/AV VTI ratio: 0.45 AI PHT:            233 msec  AORTA Ao Root diam: 3.10 cm MITRAL VALVE               TRICUSPID VALVE MV Area (PHT): 5.95 cm    TR Peak grad:   55.7 mmHg MV Decel Time: 128 msec    TR Vmax:        373.00 cm/s MV E velocity: 87.60 cm/s  SHUNTS                            Systemic VTI:  0.10 m                            Systemic Diam: 1.80 cm Yolonda Kida MD Electronically signed by Yolonda Kida MD Signature Date/Time: 09/17/2022/4:54:34 PM    Final    DG Chest 1 View  Result Date: 09/16/2022 CLINICAL DATA:  Worsening shortness of breath. 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 Cardiomegaly which has increased from 2021. Slight globular configuration of the heart, query pericardial effusion. Suspect small right pleural effusion. Chronic bilateral lung opacities that appears stable. No acute airspace disease or pneumothorax. On limited assessment, no acute osseous findings. IMPRESSION: 1. Increased cardiomegaly from 2021. Slight globular configuration of the heart, query pericardial effusion. 2. Suspect small right pleural effusion. 3. Stable chronic bilateral lung opacities. Electronically Signed   By: Keith Rake M.D.   On: 09/16/2022 17:21   MR ABDOMEN MRCP WO CONTRAST  Result Date: 09/16/2022 CLINICAL DATA:  56 year old female with history of jaundice. Nausea. EXAM: MRI ABDOMEN  WITHOUT CONTRAST  (INCLUDING MRCP) TECHNIQUE: Multiplanar multisequence MR imaging of the abdomen was performed. Heavily T2-weighted images of the biliary and pancreatic ducts were obtained, and three-dimensional MRCP images were rendered by post processing. COMPARISON:  No prior abdominal MRI or MRCP. Abdominal ultrasound 09/15/2022. FINDINGS: Comment: Portions of today's examination are substantially limited by patient respiratory motion. The study is also severely limited for detection and characterization of visceral and/or vascular lesions by lack of IV gadolinium. Lower chest: Severe cardiomegaly. Massively distended inferior vena cava. Hepatobiliary: No definite suspicious cystic or solid hepatic lesions are confidently identified on today's motion limited noncontrast examination. No intra or extrahepatic biliary ductal dilatation noted on MRCP images. Common bile duct measures only 3 mm in the porta hepatis. No filling defect within the common bile duct to suggest choledocholithiasis. T1 hyperintense, T2 hypointense material lies dependently within the gallbladder. Gallbladder does not appear overly distended. Accurate assessment for gallbladder wall thickening is limited on today's examination secondary to motion, but there may be some very mild gallbladder wall thickening and edema. No overt pericholecystic fluid or surrounding inflammatory changes. Pancreas: No definite pancreatic mass or peripancreatic fluid collections or inflammatory changes noted on today's noncontrast examination. No pancreatic ductal dilatation. Spleen:  Unremarkable. Adrenals/Urinary Tract: Unenhanced appearance of the kidneys and bilateral adrenal glands is normal. No hydroureteronephrosis in the visualized portions of the abdomen. Stomach/Bowel: Visualized portions are unremarkable. Vascular/Lymphatic: No aneurysm identified in the visualized abdominal vasculature. No lymphadenopathy noted in the abdomen. Other: No significant  volume of ascites noted in the visualized portions of the peritoneal cavity. Musculoskeletal: No aggressive appearing osseous lesions are noted in the visualized portions of the skeleton. IMPRESSION: 1. Biliary sludge lying dependently in the gallbladder. Gallbladder wall may be mildly edematous, but is poorly evaluated on today's motion limited examination. If there is any clinical concern for acute cholecystitis, further evaluation with right upper quadrant abdominal ultrasound or nuclear medicine hepatobiliary scan should be considered at this time. 2. No biliary tract dilatation to suggest obstruction. No evidence of choledocholithiasis. 3. Severe cardiomegaly. Electronically Signed   By: Vinnie Langton M.D.   On: 09/16/2022 05:28  US ABDOMEN LIMITED RUQ (LIVER/GB)  Result Date: 09/15/2022 CLINICAL DATA:  Elevated bilirubin EXAM: ULTRASOUND ABDOMEN LIMITED RIGHT UPPER QUADRANT COMPARISON:  CT abdomen and pelvis 12/01/2018 FINDINGS: Gallbladder: Sludge is present within the gallbladder. Small amount of pericholecystic fluid. The gallbladder wall is mildly thickened measuring 5 mm. Sonographic Murphy's sign was positive. Common bile duct: Diameter: 3 mm Liver: No focal lesion identified. Increased echogenicity. Portal vein is patent on Doppler imaging with bidirectional flow. Other: None. IMPRESSION: 1. Findings compatible with acute cholecystitis. 2. 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 Electronically Signed   By: Placido Sou M.D.   On: 09/15/2022 23:07    ECHO limited 09/23/22  1. Left ventricular ejection fraction, by estimation, is 35 to 40%. The left ventricle has moderately decreased function. The left ventricle demonstrates global hypokinesis. The left ventricular internal cavity size was mildly dilated.  2. There is LV septal flattenting in systole and diastole and dilated hepatic vein consistant with severe phtn. Right  ventricular systolic function is moderately reduced. The right ventricular size is moderately enlarged. Mildly increased right ventricular wall thickness. There is severely elevated pulmonary artery systolic pressure.  3. Left atrial size was severely dilated.  4. Right atrial size was severely dilated.  5. Cannot rule out mild stenosis. doppler difficult to interpret. The mitral valve is degenerative. Mild mitral valve regurgitation. 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  6. The tricuspid valve is abnormal. Tricuspid valve regurgitation is severe.  7. The aortic valve is calcified. Aortic valve regurgitation is moderate.    TELEMETRY reviewed by me (LT) 10/07/2022 : Atrial fibrillation rate 120s with paroxysms to the 130s.  EKG reviewed by me: Atrial fibrillation rate 110  Data reviewed by me (LT) 10/07/2022: Hospitalist progress note, GI consult note, nephrology note, nursing notes, CBC, BMP, vitals, telemetry, I's and O's  Principal Problem:   Severe sepsis with acute organ dysfunction ([place]) Active Problems:   A-fib ([place])   HTN (hypertension)   Chronic anticoagulation ([place])   GAD (generalized anxiety disorder)   History of substance abuse ([place])   Insomnia   Acute renal failure ([place])   Altered mental status   Hypotension   Hepatic steatosis    ASSESSMENT AND PLAN:  Chelsea C. 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  She was admitted to stepdown and is being treated for septic vs cardiogenic shock with suspected urinary source, confounded by acute on  chronic heart failure.  Cardiology is consulted for further assistance.  #Acute encephalopathy #Septic shock, suspected urinary source #Enterococcus cystitis UA on admission significant for WBCs, RBCs and was brown in color, limiting evaluation of nitrites and leukocytes.  Culture growing Enterococcus. -On IV Zosyn -Recommend cautious use of IV fluids -Wean vasopressor support as tolerated, unfortunately still requiring Levophed and vasopressin -Favor minimizing use of opioids and other sedating medicines if at all possible  #Acute on chronic hypoxic respiratory failure #Pulmonary hypertension Increased requirement overnight from high flow to HFNC  #Acute on chronic HFrEF (EF 35-40%, G3 DD, global hypokinesis) #Rheumatic MS s/p  porcine MVR 2019 BNP elevated at 785 on admission, notably less than it was on 10/25 (1800).  Appears clinically volume overloaded on exam with peripheral edema, JVD, and interstitial edema on chest x-ray.  Net +5.7 L.   -Nephrology started a low-dose of a Lasix infusion at 4 mg/h on 11/7, favor increasing this as able if her blood pressure allows. -Reinstate GDMT as her blood pressure allows with metoprolol tartrate 25 twice daily (home dose was 100 mg twice daily) and spironolactone -Diuretics on discharge 11/2: Lasix 60 mg once daily and metolazone 2.5 mg 3 times weekly in addition to daily potassium supplementation.    #Paroxysmal atrial fibrillation on Xarelto In atrial fibrillation on telemetry, tachycardic in the 120s while on two vasopressors -Hold metoprolol while requiring vasopressor support, favor restarting this as able -Hold Xarelto with AKI, recommend heparin until her renal function normalizes. -In terms of rhythm control digoxin contraindicated in renal failure, sotalol is also not a great option as we start IV diuresis (electrolyte abnormalities anticipated) -Consider starting IV amiodarone if her rate remains significantly elevated (>130 bpm) if  vasopressors can be titrated off.  Antiemetics for N/V.  #Acute renal failure on CKD 3 Renal function on admission severely decreased with BUN/creatinine 50/3.83 and GFR of 13.  Worsened with IV fluids.  Anticipate need for diuresis. During her prior admission with a similar significant degree of AKI, her renal function essentially normalized after multiple days of IV diuresis. -Nephrology following, appreciate input.  #Hyperbilirubinemia #Hepatic steatosis T. bili downtrending from 9.4 to 8.6 today.  Almost equal elevation of direct and indirect bilirubin, seen by surgery, GI and hematology during her last admission who felt this was related to underlying hepatic dysfunction from her right heart failure. No acute surgical need.  Her T. bili slowly improved after days of diuresis.  -Continue to monitor, INR is okay today at 1.5  #Elevated TSH TSH 8.9, free T4 within normal limits.  -Management per primary team  #Elevated troponin Minimally elevated with a flat trend at 51-49, in the absence of chest pain or EKG changes this is most consistent with demand/supply mismatch likely from her renal failure and not ACS   This patient's plan of care was discussed and created with Dr. Saralyn Pilar and he is in agreement.  Signed: Tristan Schroeder , PA-C 10/07/2022, 10:48 AM [facility]

## 2022-10-08 DIAGNOSIS — Z66 Do not resuscitate: Secondary | ICD-10-CM | POA: Diagnosis not present

## 2022-10-08 DIAGNOSIS — A419 Sepsis, unspecified organism: Secondary | ICD-10-CM | POA: Diagnosis not present

## 2022-10-08 DIAGNOSIS — R6520 Severe sepsis without septic shock: Secondary | ICD-10-CM | POA: Diagnosis not present

## 2022-10-08 DIAGNOSIS — Z515 Encounter for palliative care: Secondary | ICD-10-CM | POA: Diagnosis not present

## 2022-10-08 DIAGNOSIS — Z7189 Other specified counseling: Secondary | ICD-10-CM | POA: Diagnosis not present

## 2022-10-08 DIAGNOSIS — F411 Generalized anxiety disorder: Secondary | ICD-10-CM

## 2022-10-08 LAB — COMPREHENSIVE METABOLIC PANEL
ALT: 39 U/L (ref 0–44)
AST: 65 U/L — ABNORMAL HIGH (ref 15–41)
Albumin: 2.6 g/dL — ABNORMAL LOW (ref 3.5–5.0)
Alkaline Phosphatase: 82 U/L (ref 38–126)
Anion gap: 12 (ref 5–15)
BUN: 48 mg/dL — ABNORMAL HIGH (ref 6–20)
CO2: 30 mmol/L (ref 22–32)
Calcium: 9 mg/dL (ref 8.9–10.3)
Chloride: 99 mmol/L (ref 98–111)
Creatinine, Ser: 2.73 mg/dL — ABNORMAL HIGH (ref 0.44–1.00)
GFR, Estimated: 20 mL/min — ABNORMAL LOW (ref >60)
Glucose, Bld: 124 mg/dL — ABNORMAL HIGH (ref 70–99)
Potassium: 3.3 mmol/L — ABNORMAL LOW (ref 3.5–5.1)
Sodium: 141 mmol/L (ref 135–145)
Total Bilirubin: 8.5 mg/dL — ABNORMAL HIGH (ref 0.3–1.2)
Total Protein: 6.7 g/dL (ref 6.5–8.1)

## 2022-10-08 LAB — HEPARIN LEVEL (UNFRACTIONATED)
Heparin Unfractionated: 0.3 IU/mL (ref 0.30–0.70)
Heparin Unfractionated: 0.1 IU/mL — ABNORMAL LOW (ref 0.30–0.70)

## 2022-10-08 LAB — CBC
HCT: 29.2 % — ABNORMAL LOW (ref 36.0–46.0)
Hemoglobin: 8.4 g/dL — ABNORMAL LOW (ref 12.0–15.0)
MCH: 24.9 pg — ABNORMAL LOW (ref 26.0–34.0)
MCHC: 28.8 g/dL — ABNORMAL LOW (ref 30.0–36.0)
MCV: 86.4 fL (ref 80.0–100.0)
Platelets: 337 10*3/uL (ref 150–400)
RBC: 3.38 MIL/uL — ABNORMAL LOW (ref 3.87–5.11)
RDW: 26.7 % — ABNORMAL HIGH (ref 11.5–15.5)
WBC: 12.1 10*3/uL — ABNORMAL HIGH (ref 4.0–10.5)
nRBC: 0.7 % — ABNORMAL HIGH (ref 0.0–0.2)

## 2022-10-08 LAB — GLUCOSE, CAPILLARY
Glucose-Capillary: 129 mg/dL — ABNORMAL HIGH (ref 70–99)
Glucose-Capillary: 114 mg/dL — ABNORMAL HIGH (ref 70–99)
Glucose-Capillary: 164 mg/dL — ABNORMAL HIGH (ref 70–99)
Glucose-Capillary: 163 mg/dL — ABNORMAL HIGH (ref 70–99)
Glucose-Capillary: 137 mg/dL — ABNORMAL HIGH (ref 70–99)
Glucose-Capillary: 117 mg/dL — ABNORMAL HIGH (ref 70–99)

## 2022-10-08 LAB — MAGNESIUM: Magnesium: 2.3 mg/dL (ref 1.7–2.4)

## 2022-10-08 LAB — APTT
aPTT: 140 seconds — ABNORMAL HIGH (ref 24–36)
aPTT: 69 s — ABNORMAL HIGH (ref 24–36)

## 2022-10-08 LAB — PHOSPHORUS: Phosphorus: 3.8 mg/dL (ref 2.5–4.6)

## 2022-10-08 MED ORDER — HEPARIN (PORCINE) 25000 UT/250ML-% IV SOLN
1650.0000 [IU]/h | INTRAVENOUS | Status: DC
  Administered 2022-10-09: 1350 [IU]/h via INTRAVENOUS
  Filled 2022-10-08: qty 250

## 2022-10-08 MED ORDER — LACTULOSE 10 GM/15ML PO SOLN
10.0000 g | Freq: Two times a day (BID) | ORAL | Status: DC
  Administered 2022-10-08: 10 g via ORAL
  Filled 2022-10-08: qty 30

## 2022-10-08 MED ORDER — METOPROLOL TARTRATE 5 MG/5ML IV SOLN
2.5000 mg | Freq: Four times a day (QID) | INTRAVENOUS | Status: DC | PRN
  Administered 2022-10-08 – 2022-10-09 (×2): 2.5 mg via INTRAVENOUS
  Filled 2022-10-08 (×2): qty 5

## 2022-10-08 MED ORDER — DIAZEPAM 5 MG/ML IJ SOLN
5.0000 mg | Freq: Four times a day (QID) | INTRAMUSCULAR | Status: DC | PRN
  Administered 2022-10-08 – 2022-10-09 (×2): 10 mg via INTRAVENOUS
  Filled 2022-10-08 (×2): qty 2

## 2022-10-08 MED ORDER — POTASSIUM CHLORIDE 20 MEQ PO PACK
40.0000 meq | PACK | Freq: Two times a day (BID) | ORAL | Status: AC
  Administered 2022-10-08: 40 meq via ORAL
  Filled 2022-10-08: qty 2

## 2022-10-08 MED ORDER — AMIODARONE LOAD VIA INFUSION
150.0000 mg | Freq: Once | INTRAVENOUS | Status: AC
  Administered 2022-10-08: 150 mg via INTRAVENOUS
  Filled 2022-10-08: qty 83.34

## 2022-10-08 MED ORDER — OXYCODONE HCL 5 MG PO TABS
10.0000 mg | ORAL_TABLET | ORAL | Status: DC | PRN
  Administered 2022-10-09 (×2): 10 mg via ORAL
  Filled 2022-10-08 (×2): qty 2

## 2022-10-08 MED ORDER — AMIODARONE HCL IN DEXTROSE 360-4.14 MG/200ML-% IV SOLN
30.0000 mg/h | INTRAVENOUS | Status: DC
  Administered 2022-10-08 (×2): 30 mg/h via INTRAVENOUS
  Filled 2022-10-08 (×2): qty 200

## 2022-10-08 MED ORDER — AMIODARONE HCL IN DEXTROSE 360-4.14 MG/200ML-% IV SOLN
60.0000 mg/h | INTRAVENOUS | Status: DC
  Administered 2022-10-08 (×2): 60 mg/h via INTRAVENOUS
  Filled 2022-10-08 (×2): qty 200

## 2022-10-08 MED ORDER — POTASSIUM CHLORIDE CRYS ER 20 MEQ PO TBCR
40.0000 meq | EXTENDED_RELEASE_TABLET | Freq: Two times a day (BID) | ORAL | Status: DC
  Administered 2022-10-08: 40 meq via ORAL
  Filled 2022-10-08: qty 2

## 2022-10-08 NOTE — Progress Notes (Signed)
[facility]  ROUNDING NOTE   Subjective:   Patient seen sitting up in bed in ICU Alert and oriented, states she feels better than yesterday Weaned to HFNC 6L Pressors: Levo and Vaso Generalized edema remains but improving Urine output 1.3L on Furosemide drip Foley catheter and Flexiseal Heparin drip in place   Objective:  Vital signs in last 24 hours:  Temp:  [97.7 F (36.5 C)-98.4 F (36.9 C)] 98.1 F (36.7 C) (11/09 0449) Pulse Rate:  [110-158] 129 (11/09 0600) Resp:  [12-35] 24 (11/09 0600) BP: (79-107)/(62-91) 79/67 (11/09 0400) SpO2:  [84 %-100 %] 98 % (11/09 0600) Arterial Line BP: (84-123)/(54-82) 93/64 (11/09 0600) FiO2 (%):  [45 %] 45 % (11/08 1200) Weight:  [113.1 kg] 113.1 kg (11/09 0500)  Weight change: 7.2 kg Filed Weights   10/05/22 1700 10/07/22 0456 10/08/22 0500  Weight: 106.3 kg 105.9 kg 113.1 kg    Intake/Output: I/O last 3 completed shifts: In: 1689.2 [P.O.:420; I.V.:1067; IV Piggyback:202.2] Out: 2400 [Urine:1750; Stool:650]   Intake/Output this shift:  No intake/output data recorded.  Physical Exam: General: NAD  Head: Normocephalic, atraumatic. Moist oral mucosal membranes  Eyes: Anicteric  Lungs:  diminished, HFNC   Heart: Irregular rate and rhythm  Abdomen:  Soft, nontender  Extremities:  1+ peripheral edema.  Neurologic: Nonfocal, moving all four extremities  Skin: No lesions  Access: None    Basic Metabolic Panel: Recent Labs  Lab 10/03/2022 1602 10/05/22 0306 10/06/22 0215 10/06/22 2050 10/07/22 0547 10/08/22 0450  NA 137 137 136 139 139 141  K 5.5* 5.2* 4.6 4.1 3.8 3.3*  CL 90* 93* 95* 97* 97* 99  CO2 34* 32 '30 29 30 30  '$ GLUCOSE 64* 93 101* 105* 98 124*  BUN 50* 53* 51* 53* 48* 48*  CREATININE 3.83* 3.87* 3.59* 3.29* 3.03* 2.73*  CALCIUM 9.3 9.0 8.5* 8.8* 8.7* 9.0  MG 2.2  --  1.9  --  1.7 2.3  PHOS  --   --  5.5*  --  4.6 3.8     Liver Function Tests: Recent Labs  Lab 10/19/2022 1602  10/05/22 0306 10/06/22 0215 10/07/22 0547 10/08/22 0450  AST 70* 73* 70* 60* 65*  ALT 36 40 40 39 39  ALKPHOS 93 91 91 81 82  BILITOT 9.0* 9.4* 9.4* 8.6* 8.5*  PROT 6.9 7.1 7.0 6.8 6.7  ALBUMIN 2.6* 2.8* 2.7* 2.6* 2.6*    No results for input(s): "LIPASE", "AMYLASE" in the last 168 hours. Recent Labs  Lab 10/05/22 0306  AMMONIA 67*     CBC: Recent Labs  Lab 10/18/2022 1602 10/05/22 0306 10/06/22 0215 10/07/22 0547 10/08/22 0450  WBC 12.0* 12.7* 13.5* 12.8* 12.1*  NEUTROABS 9.4*  --   --   --   --   HGB 8.6* 9.0* 9.2* 8.8* 8.4*  HCT 30.7* 32.5* 32.4* 30.3* 29.2*  MCV 88.5 88.8 86.9 86.6 86.4  PLT 258 290 307 309 337     Cardiac Enzymes: No results for input(s): "CKTOTAL", "CKMB", "CKMBINDEX", "TROPONINI" in the last 168 hours.  BNP: Invalid input(s): "POCBNP"  CBG: Recent Labs  Lab 10/07/22 1559 10/07/22 1930 10/07/22 2335 10/08/22 0355 10/08/22 0739  GLUCAP 118* 122* 130* 129* 114*     Microbiology: Results for orders placed or performed during the hospital encounter of 10/17/2022  Resp Panel by RT-PCR (Flu A&B, Covid) Anterior Nasal Swab     Status: None   Collection Time: 10/25/2022  3:44 PM   Specimen: Anterior Nasal Swab  Result Value Ref Range Status   SARS Coronavirus 2 by RT PCR NEGATIVE NEGATIVE Final    Comment: (NOTE) SARS-CoV-2 target nucleic acids are NOT DETECTED.  The SARS-CoV-2 RNA is generally detectable in upper respiratory specimens during the acute phase of infection. The lowest concentration of SARS-CoV-2 viral copies this assay can detect is 138 copies/mL. A negative result does not preclude SARS-Cov-2 infection and should not be used as the sole basis for treatment or other patient management decisions. A negative result may occur with  improper specimen collection/handling, submission of specimen other than nasopharyngeal swab, presence of viral mutation(s) within the areas targeted by this assay, and inadequate number of  viral copies(<138 copies/mL). A negative result must be combined with clinical observations, patient history, and epidemiological information. The expected result is Negative.  Fact Sheet for Patients:  EntrepreneurPulse.com.au  Fact Sheet for Healthcare Providers:  IncredibleEmployment.be  This test is no t yet approved or cleared by the [place] FDA and  has been authorized for detection and/or diagnosis of SARS-CoV-2 by FDA under an Emergency Use Authorization (EUA). This EUA will remain  in effect (meaning this test can be used) for the duration of the COVID-19 declaration under Section 564(b)(1) of the Act, 21 U.S.C.section 360bbb-3(b)(1), unless the authorization is terminated  or revoked sooner.       Influenza A by PCR NEGATIVE NEGATIVE Final   Influenza B by PCR NEGATIVE NEGATIVE Final    Comment: (NOTE) The Xpert Xpress SARS-CoV-2/FLU/RSV plus assay is intended as an aid in the diagnosis of influenza from Nasopharyngeal swab specimens and should not be used as a sole basis for treatment. Nasal washings and aspirates are unacceptable for Xpert Xpress SARS-CoV-2/FLU/RSV testing.  Fact Sheet for Patients: EntrepreneurPulse.com.au  Fact Sheet for Healthcare Providers: IncredibleEmployment.be  This test is not yet approved or cleared by the [place] FDA and has been authorized for detection and/or diagnosis of SARS-CoV-2 by FDA under an Emergency Use Authorization (EUA). This EUA will remain in effect (meaning this test can be used) for the duration of the COVID-19 declaration under Section 564(b)(1) of the Act, 21 U.S.C. section 360bbb-3(b)(1), unless the authorization is terminated or revoked.  Performed at [facility], [address] [place], [place] [address]   Urine Culture     Status: Abnormal   Collection Time: 10/13/2022  4:41 PM   Specimen: Urine, Clean Catch   Result Value Ref Range Status   Specimen Description   Final    URINE, CLEAN CATCH Performed at [facility], [address] [place], [place] [address]    Special Requests   Final    NONE Performed at [facility], [place], [place] [address]    Culture (A)  Final    >=100,000 COLONIES/mL ESCHERICHIA COLI >=100,000 COLONIES/mL ENTEROCOCCUS FAECALIS    Report Status 10/07/2022 FINAL  Final   Organism ID, Bacteria ESCHERICHIA COLI (A)  Final   Organism ID, Bacteria ENTEROCOCCUS FAECALIS (A)  Final      Susceptibility   Escherichia coli - MIC*    AMPICILLIN <=2 SENSITIVE Sensitive     CEFAZOLIN <=4 SENSITIVE Sensitive     CEFEPIME <=0.12 SENSITIVE Sensitive     CEFTRIAXONE <=0.25 SENSITIVE Sensitive     CIPROFLOXACIN <=0.25 SENSITIVE Sensitive     GENTAMICIN <=1 SENSITIVE Sensitive     IMIPENEM <=0.25 SENSITIVE Sensitive     NITROFURANTOIN <=16 SENSITIVE Sensitive     TRIMETH/SULFA <=20 SENSITIVE Sensitive  AMPICILLIN/SULBACTAM <=2 SENSITIVE Sensitive     PIP/TAZO <=4 SENSITIVE Sensitive     * >=100,000 COLONIES/mL ESCHERICHIA COLI   Enterococcus faecalis - MIC*    AMPICILLIN <=2 SENSITIVE Sensitive     NITROFURANTOIN <=16 SENSITIVE Sensitive     VANCOMYCIN 1 SENSITIVE Sensitive     * >=100,000 COLONIES/mL ENTEROCOCCUS FAECALIS  Culture, blood (routine x 2)     Status: None (Preliminary result)   Collection Time: 10/06/2022  5:08 PM   Specimen: BLOOD LEFT HAND  Result Value Ref Range Status   Specimen Description BLOOD LEFT HAND  Final   Special Requests   Final    BOTTLES DRAWN AEROBIC ONLY Blood Culture results may not be optimal due to an inadequate volume of blood received in culture bottles   Culture   Final    NO GROWTH 4 DAYS Performed at [facility], [address] [place], [place] [address]    Report Status PENDING  Incomplete  Culture, blood (routine x 2)     Status: None (Preliminary result)   Collection Time:  10/22/2022  5:42 PM   Specimen: BLOOD LEFT HAND  Result Value Ref Range Status   Specimen Description BLOOD LEFT HAND  Final   Special Requests   Final    BOTTLES DRAWN AEROBIC AND ANAEROBIC Blood Culture adequate volume   Culture   Final    NO GROWTH 4 DAYS Performed at [facility], [address] [place], [place] [address]    Report Status PENDING  Incomplete  MRSA Next Gen by PCR, Nasal     Status: None   Collection Time: 10/05/22  9:12 AM   Specimen: Nasal Mucosa; Nasal Swab  Result Value Ref Range Status   MRSA by PCR Next Gen NOT DETECTED NOT DETECTED Final    Comment: (NOTE) The GeneXpert MRSA Assay (FDA approved for NASAL specimens only), is one component of a comprehensive MRSA colonization surveillance program. It is not intended to diagnose MRSA infection nor to guide or monitor treatment for MRSA infections. Test performance is not FDA approved in patients less than 78 years old. Performed at [facility], [address] [place], [place] [address]     Coagulation Studies: Recent Labs    10/05/22 1419 10/06/22 0215  LABPROT 18.0* 20.8*  INR 1.5* 1.8*     Urinalysis: No results for input(s): "COLORURINE", "LABSPEC", "PHURINE", "GLUCOSEU", "HGBUR", "BILIRUBINUR", "KETONESUR", "PROTEINUR", "UROBILINOGEN", "NITRITE", "LEUKOCYTESUR" in the last 72 hours.  Invalid input(s): "APPERANCEUR"     Imaging: NM Hepatobiliary Liver Func  Result Date: 10/06/2022 CLINICAL DATA:  Hyperbilirubinemia.  Concern for cholecystitis. EXAM: NUCLEAR MEDICINE HEPATOBILIARY IMAGING TECHNIQUE: Sequential images of the abdomen were obtained out to 60 minutes following intravenous administration of radiopharmaceutical. RADIOPHARMACEUTICALS:  7.3 mCi Tc-54m Choletec IV COMPARISON:  Ultrasound 10/05/2022 FINDINGS: There is delayed clearance radiotracer from the blood pool. Delayed clearance of radiotracer from the liver into the bile ducts. The gallbladder begins to fill  at 60 minutes. The gallbladder is elongated nondistended. No counts evident within common bile duct or small bowel over the course the study. IMPRESSION: 1. Filling of the gallbladder consistent with patent cystic duct and NOT consistent with acute cholecystitis. 2. Poor clearance radiotracer from blood pool and poor clearance of radiotracer from the liver. Favor liver dysfunction over biliary obstruction. The common bile duct is normal on ultrasound 1 day prior. The gallbladder is nondistended. Electronically Signed   By: SSuzy BouchardM.D.   On: 10/06/2022 16:52  Medications:    sodium chloride     amiodarone 60 mg/hr (10/08/22 0927)   Followed by   amiodarone     furosemide (LASIX) 200 mg in dextrose 5 % 100 mL (2 mg/mL) infusion 4 mg/hr (10/08/22 0600)   heparin 1,100 Units/hr (10/08/22 0722)   norepinephrine (LEVOPHED) Adult infusion 6 mcg/min (10/08/22 0600)   vasopressin 0.03 Units/min (10/08/22 0600)    amiodarone  150 mg Intravenous Once   amoxicillin-clavulanate  1 tablet Oral Q12H   Chlorhexidine Gluconate Cloth  6 each Topical Daily   feeding supplement  237 mL Oral TID BM   lactulose  30 g Oral BID   multivitamin with minerals  1 tablet Oral Daily   pantoprazole  80 mg Oral Daily   potassium chloride  40 mEq Oral BID   rifaximin  550 mg Oral BID   rosuvastatin  10 mg Oral Daily   acetaminophen **OR** acetaminophen, docusate sodium, fentaNYL (SUBLIMAZE) injection, haloperidol lactate, hydrALAZINE, LORazepam, melatonin, metoprolol tartrate, nitroGLYCERIN, senna-docusate  Assessment/ Plan:  [name] is a 56 y.o.  female with past medical conditions including drug and alcohol abuse, hypertension, hyperlipidemia, depression and atrial fib, who was admitted to [facility] on 10/02/2022 for Generalized weakness [R53.1] AKI (acute kidney injury) ([place]) [N17.9] Severe sepsis with acute organ dysfunction ([place]) [A41.9, R65.20] Acute on chronic respiratory failure  with hypoxia ([place]) [J96.21] Sepsis, due to unspecified organism, unspecified whether acute organ dysfunction present ([place]) [A41.9]   Acute kidney injury with hyperkalemia likely secondary to cardiorenal syndrome and possible UTI, urine culture pending. Normal renal function in July 2023. Creatinine on admission , 3.83.   Creatinine continues to improve with fluid status. Urine output remains acceptable. No need for dialysis at this time. Will continue to monitor progress. Avoid hypotension.  Lab Results  Component Value Date   CREATININE 2.73 (H) 10/08/2022   CREATININE 3.03 (H) 10/07/2022   CREATININE 3.29 (H) 10/06/2022    Intake/Output Summary (Last 24 hours) at 10/08/2022 0954 Last data filed at 10/08/2022 0600 Gross per 24 hour  Intake 1289.17 ml  Output 1615 ml  Net -325.83 ml    2. Anemia, likely due to kidney injury.  Hgb slowly decreasing. May consider iron or ESA, if needed.    3. Hypotension likely secondary to sepsis.Blood pressure remains decreased, Levo and Vaso infusing.   4. Chronic systolic heart failure. ECHO on 09/23/22 shows EF 35-40%, right ventricular enlarged with severe tricuspid regurgitation and aortic calcification.  Continue furosemide drip, titration deferred to cardiology.   LOS: 4   11/9/20239:54 AM

## 2022-10-08 NOTE — Consult Note (Signed)
ANTICOAGULATION CONSULT NOTE  Pharmacy Consult for heparin gtt Indication: atrial fibrillation  Patient Measurements: Height: '5\' 4"'$  (162.6 cm) Weight: 113.1 kg (249 lb 5.4 oz) IBW/kg (Calculated) : 54.7 Heparin Dosing Weight: 79.8 kg  Labs: Recent Labs     0000 10/05/22 1419 10/06/22 0215 10/06/22 0418 10/06/22 2050 10/07/22 0547 10/07/22 1757 10/08/22 0450  HGB   < >  --  9.2*  --   --  8.8*  --  8.4*  HCT  --   --  32.4*  --   --  30.3*  --  29.2*  PLT  --   --  307  --   --  309  --  337  APTT  --   --  87*   < > 74* 159* 129* 140*  LABPROT  --  18.0* 20.8*  --   --   --   --   --   INR  --  1.5* 1.8*  --   --   --   --   --   HEPARINUNFRC  --   --  0.27*   < > 0.14* 0.48 0.35 0.30  CREATININE  --   --  3.59*  --  3.29* 3.03*  --   --    < > = values in this interval not displayed.    Estimated Creatinine Clearance: 25.9 mL/min (A) (by C-G formula based on SCr of 3.03 mg/dL (H)).  Medications:  Heparin Dosing Weight: 79.8 kg PTA: Xarelto 20 QHS (last dose on 11/03 PTA) Inpatient: [facility] 5k Q8h  >> heparin gtt   Assessment: 56yo F w/ h/o drug and alcohol abuse, HTN, HLD, MDD and AFib, who was admitted to [facility] on 10/07/2022 for severe sepsis with acute organ dysfunction. Pharmacy consulted to transition of prophylactic heparin to infusion. Pt xarelto for Afib PTA is on hold ISO severe AKI until renal fxn normalizes.  Baseline Labs: Hgb - 9.0; Plts - 290  Goal of Therapy:  Heparin level 0.3-0.7 units/ml aPTT 66-102 seconds Monitor platelets by anticoagulation protocol: Yes   Plan:  11/9 @ 0450:  APTT = 140, HL = 0.30 - aPTT elevated but HL is within normal range - Will hold heparin drip for 1 hr and restart @ 1100 units. - Will recheck aPTT 8 hrs after restart  - Will recheck HL on 11/10 with AM labs , use aPTT to dose until HL and aPTT are therapeutic  --Daily CBC per protocol; monitor closely for bleeding given coagulopathy  Dhalia Zingaro D 10/08/2022 5:51  AM

## 2022-10-08 NOTE — Progress Notes (Signed)
[place] NOTE       Patient ID: Chelsea Ramsey MRN: [id number] DOB/AGE: 07-Jun-1966 56 y.o.  Admit date: 10/27/2022 Referring Physician Darel Hong, NP  Primary Physician Dr. Gracy Racer Primary Cardiologist Dr. 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  She was admitted to stepdown and is being treated for septic vs cardiogenic shock with suspected urinary source, confounded by acute on chronic heart failure.  Cardiology is consulted for further assistance.  Interval History:   -seen and examined this morning. She wants to get out of bed to use the commode. Complains of back pain, generalized leg pain. No chest pain or shortness of breath. Weaned from [facility] to 6L HFNC this morning -Still requiring levo and vasopressin, unable to be titrated off yesterday. Bps by A line are high 97J-883G systolic.  In A-fib with RVR rates steady in the 120s with some paroxysms to the 130s-140s. -UOP acceptable, renal function improving slowly.    Review of systems complete and found to be negative unless listed above    Past Medical History:  Diagnosis Date   Acute drug-induced gout of left foot 03/01/2018   Last Assessment & Plan:  Likely at least partially brought on by diuresis.  Needs to continue to diurese  Will push hydration Stop allopurinol given initiation during acute flare may worsen this, re-broach this when asymptomatic Avoid nsaids given stomach pain  Trial colchicine Add acetaminophen Ice, elevate, rest   Allergy    seasonal   Anxiety    Arthritis    Right Knee   Asthma    CHF (congestive heart failure) (HCC)    COPD (chronic obstructive pulmonary disease) (HCC)    Coronary artery disease    Leaky heart valve   Diabetes mellitus without complication (HCC)    Dysrhythmia    Fibromyalgia    GERD (gastroesophageal reflux disease)    Hypertension    Pneumonia    PUD (peptic ulcer disease)    Pulmonary HTN (HCC)    Rheumatic fever/heart disease    Sleep apnea     Past Surgical History:  Procedure Laterality Date   ADENOIDECTOMY     COLONOSCOPY WITH PROPOFOL N/A 11/06/2019   Procedure: COLONOSCOPY WITH PROPOFOL;  Surgeon: Virgel Manifold, MD;  Location: [facility] ENDOSCOPY;  Service: Endoscopy;  Laterality: N/A;  2 day prep    COLONOSCOPY WITH PROPOFOL N/A 06/29/2022   Procedure: COLONOSCOPY WITH PROPOFOL;  Surgeon: Lin Landsman, MD;  Location: [facility] ENDOSCOPY;  Service: Gastroenterology;  Laterality: N/A;   CORONARY ARTERY BYPASS GRAFT     June 27 2018   ESOPHAGOGASTRODUODENOSCOPY (EGD) WITH PROPOFOL N/A 05/25/2018   Procedure: ESOPHAGOGASTRODUODENOSCOPY (EGD) WITH PROPOFOL;  Surgeon: Lucilla Lame, MD;  Location: [facility] ENDOSCOPY;  Service: Endoscopy;  Laterality: N/A;   ESOPHAGOGASTRODUODENOSCOPY (EGD) WITH PROPOFOL N/A 11/06/2019   Procedure: ESOPHAGOGASTRODUODENOSCOPY (EGD) WITH PROPOFOL;  Surgeon: Virgel Manifold, MD;  Location: [facility] ENDOSCOPY;  Service: Endoscopy;  Laterality: N/A;   ESOPHAGOGASTRODUODENOSCOPY (EGD) WITH PROPOFOL N/A 06/29/2022  Procedure: ESOPHAGOGASTRODUODENOSCOPY (EGD) WITH PROPOFOL;  Surgeon: Lin Landsman, MD;  Location: [facility] ENDOSCOPY;  Service: Gastroenterology;  Laterality: N/A;   GIVENS CAPSULE STUDY N/A 07/22/2022   Procedure: GIVENS CAPSULE STUDY;  Surgeon: Lin Landsman, MD;  Location: [facility] ENDOSCOPY;  Service: Gastroenterology;  Laterality: N/A;   MITRAL VALVE REPLACEMENT      MULTIPLE TOOTH EXTRACTIONS     TONSILLECTOMY     TOTAL KNEE ARTHROPLASTY Right 09/04/2016   Procedure: TOTAL KNEE ARTHROPLASTY; with lateral release;  Surgeon: Earlie Server, MD;  Location: [place];  Service: Orthopedics;  Laterality: Right;    Medications Prior to Admission  Medication Sig Dispense Refill Last Dose   albuterol (VENTOLIN HFA) 108 (90 Base) MCG/ACT inhaler Inhale 1-2 puffs into the lungs every 6 (six) hours as needed for wheezing or shortness of breath.   prn   allopurinol (ZYLOPRIM) 100 MG tablet Take 1 tablet (100 mg total) by mouth 2 (two) times daily. 60 tablet 5 10/02/2022   Cholecalciferol (VITAMIN D) 50 MCG (2000 UT) CAPS Take 1 capsule (2,000 Units total) by mouth daily. 30 capsule 5 10/02/2022   colchicine 0.6 MG tablet Take 1 tablet (0.6 mg total) by mouth daily. As directed 30 tablet 5 prn   diazepam (VALIUM) 10 MG tablet Take 10 mg by mouth 2 (two) times daily as needed for anxiety.   prn   docusate sodium (COLACE) 100 MG capsule Take 100 mg by mouth 2 (two) times daily as needed for mild constipation or moderate constipation.   prn   empagliflozin (JARDIANCE) 10 MG TABS tablet Take 10 mg by mouth daily.   10/02/2022   furosemide (LASIX) 20 MG tablet Take 3 tablets (60 mg total) by mouth daily. 90 tablet 0 10/02/2022   gabapentin (NEURONTIN) 600 MG tablet Take 0.5 tablets (300 mg total) by mouth 2 (two) times daily. (Patient taking differently: Take 600 mg by mouth 3 (three) times daily.) 60 tablet 0 prn   ipratropium-albuterol (DUONEB) 0.5-2.5 (3) MG/3ML SOLN INHALE THE CONTENTS OF 1 VIAL(3MLS) VIA NEBULIZER 3 TIMES DAILY AS NEEDED 360 mL 1 prn   Iron-FA-B Cmp-C-Biot-Probiotic (FUSION PLUS) CAPS Take 1 capsule by mouth daily. (Patient not taking: Reported on 09/16/2022) 30 capsule 3    lactulose (CHRONULAC) 10 GM/15ML solution Take 45 mLs (30 g total) by mouth 2 (two) times daily. 2700 mL 0 10/02/2022   metolazone (ZAROXOLYN) 2.5 MG tablet Take 1 tablet (2.5 mg total) by  mouth every Monday, Wednesday, and Friday. 12 tablet 0 10/02/2022   metoprolol tartrate (LOPRESSOR) 50 MG tablet Take 1 tablet (50 mg total) by mouth 2 (two) times daily. 60 tablet 0 10/02/2022   nitroGLYCERIN (NITROSTAT) 0.4 MG SL tablet Place 1 tablet (0.4 mg total) under the tongue every 5 (five) minutes as needed for chest pain. 30 tablet 12 prn   omeprazole (PRILOSEC) 40 MG capsule Take 40 mg by mouth daily.   10/02/2022   oxyCODONE ER 9 MG C12A Take 9 mg by mouth 2 (two) times daily.   10/02/2022   potassium chloride (KLOR-CON) 10 MEQ tablet Take 4 tablets (40 mEq total) by mouth 2 (two) times daily. And additional 19mq on metolazone days (Patient taking differently: Take 40 mEq by mouth See admin instructions. Take 4 tablets (410m) by mouth twice daily - take an additional 2 tablets (2023m by mouth when taking metolazone) 64 tablet 5 10/02/2022   promethazine (PHENERGAN) 12.5 MG tablet Take 1 tablet (12.5 mg total) by mouth every 8 (eight)  hours as needed for nausea or vomiting. (Patient taking differently: Take 12.5 mg by mouth 2 (two) times daily as needed for nausea or vomiting.) 30 tablet 0 prn   rifaximin (XIFAXAN) 550 MG TABS tablet Take 1 tablet (550 mg total) by mouth 2 (two) times daily. 60 tablet 0 10/02/2022   rivaroxaban (XARELTO) 20 MG TABS tablet Take 20 mg by mouth daily with supper.   10/02/2022   rosuvastatin (CRESTOR) 10 MG tablet Take 10 mg by mouth daily.   10/02/2022   spironolactone (ALDACTONE) 25 MG tablet Take 0.5 tablets (12.5 mg total) by mouth daily. 15 tablet 0 10/02/2022   Social History   Socioeconomic History   Marital status: Married    Spouse name: Not on file   Number of children: Not on file   Years of education: Not on file   Highest education level: Not on file  Occupational History   Occupation: disabled  Tobacco Use   Smoking status: Every Day    Packs/day: 0.10    Types: Cigarettes   Smokeless tobacco: Never   Tobacco comments:    4/20 was not able  to quit.  Still at 1-3 a day. She would like to wait to set a new quit date until we get back to class to have the extra in person support  Vaping Use   Vaping Use: Never used  Substance and Sexual Activity   Alcohol use: Never    Alcohol/week: 3.0 standard drinks of alcohol    Types: 3 Cans of beer per week    Comment: 16 oz per week   Drug use: Not Currently    Comment: Previous use of cocaine and marijuana last use 07/31/16    Sexual activity: Not Currently  Other Topics Concern   Not on file  Social History Narrative   Not on file   Social Determinants of Health   Financial Resource Strain: Low Risk  (10/02/2019)   Overall Financial Resource Strain (CARDIA)    Difficulty of Paying Living Expenses: Not hard at all  Food Insecurity: No Food Insecurity (09/18/2022)   Hunger Vital Sign    Worried About Running Out of Food in the Last Year: Never true    Ran Out of Food in the Last Year: Never true  Transportation Needs: No Transportation Needs (09/18/2022)   PRAPARE - Hydrologist (Medical): No    Lack of Transportation (Non-Medical): No  Physical Activity: Insufficiently Active (10/02/2019)   Exercise Vital Sign    Days of Exercise per Week: 7 days    Minutes of Exercise per Session: 20 min  Stress: No Stress Concern Present (10/02/2019)   [place]    Feeling of Stress : Not at all  Social Connections: Moderately Integrated (10/02/2019)   Social Connection and Isolation Panel [NHANES]    Frequency of Communication with Friends and Family: More than three times a week    Frequency of Social Gatherings with Friends and Family: More than three times a week    Attends Religious Services: 1 to 4 times per year    Active Member of [employer] or Organizations: Yes    Attends Archivist Meetings: 1 to 4 times per year    Marital Status: Never married  Intimate Partner Violence: Not At  Risk (09/18/2022)   Humiliation, Afraid, Rape, and Kick questionnaire    Fear of Current or Ex-Partner: No    Emotionally Abused:  No    Physically Abused: No    Sexually Abused: No    Family History  Problem Relation Age of Onset   COPD Mother    Cancer Mother        Bone   Asthma Mother    Rheum arthritis Mother    Congestive Heart Failure Father    Breast cancer Maternal Aunt      Vitals:   10/08/22 0500 10/08/22 0503 10/08/22 0530 10/08/22 0600  BP:      Pulse: (!) 110 (!) 110 (!) 122 (!) 129  Resp: '18 14 19 '$ (!) 24  Temp:      TempSrc:      SpO2: 98% 97% 98% 98%  Weight: 113.1 kg     Height:        PHYSICAL EXAM General: Acute on chronically ill-appearing black female, in no acute distress. Sitting upright in ICU bed, safety mitts on hands. No family at bedside. HEENT:  Normocephalic and atraumatic. Neck:  + JVD. R IJ line.  Lungs: Short of breath appearing on HFNC.  Decreased breath sounds bilaterally with coarseness to auscultation.   Heart: Tachycardic irregularly irregular rhythm. Normal S1 and S2 without gallops.  2/6 systolic murmur best heard at the RUSB.  Abdomen: Non-distended appearing with excess adiposity and a large pannus. Rectal tube in place Msk: Generalized weakness Extremities: No clubbing, cyanosis.  1+ bilateral lower extremity edema, feet cool to touch.  Neuro: Awake and alert Psych: Frustrated mood.  Labs: Basic Metabolic Panel: Recent Labs    10/07/22 0547 10/08/22 0450  NA 139 141  K 3.8 3.3*  CL 97* 99  CO2 30 30  GLUCOSE 98 124*  BUN 48* 48*  CREATININE 3.03* 2.73*  CALCIUM 8.7* 9.0  MG 1.7 2.3  PHOS 4.6 3.8    Liver Function Tests: Recent Labs    10/07/22 0547 10/08/22 0450  AST 60* 65*  ALT 39 39  ALKPHOS 81 82  BILITOT 8.6* 8.5*  PROT 6.8 6.7  ALBUMIN 2.6* 2.6*    No results for input(s): "LIPASE", "AMYLASE" in the last 72 hours. CBC: Recent Labs    10/07/22 0547 10/08/22 0450  WBC 12.8* 12.1*  HGB 8.8*  8.4*  HCT 30.3* 29.2*  MCV 86.6 86.4  PLT 309 337    Cardiac Enzymes: No results for input(s): "CKTOTAL", "CKMB", "CKMBINDEX", "TROPONINIHS" in the last 72 hours.  BNP: No results for input(s): "BNP" in the last 72 hours.  D-Dimer: No results for input(s): "DDIMER" in the last 72 hours. Hemoglobin A1C: No results for input(s): "HGBA1C" in the last 72 hours. Fasting Lipid Panel: No results for input(s): "CHOL", "HDL", "LDLCALC", "TRIG", "CHOLHDL", "LDLDIRECT" in the last 72 hours. Thyroid Function Tests: No results for input(s): "TSH", "T4TOTAL", "T3FREE", "THYROIDAB" in the last 72 hours.  Invalid input(s): "FREET3"  Anemia Panel: No results for input(s): "VITAMINB12", "FOLATE", "FERRITIN", "TIBC", "IRON", "RETICCTPCT" in the last 72 hours.    Radiology: NM Hepatobiliary Liver Func  Result Date: 10/06/2022 CLINICAL DATA:  Hyperbilirubinemia.  Concern for cholecystitis. EXAM: NUCLEAR MEDICINE HEPATOBILIARY IMAGING TECHNIQUE: Sequential images of the abdomen were obtained out to 60 minutes following intravenous administration of radiopharmaceutical. RADIOPHARMACEUTICALS:  7.3 mCi Tc-32m Choletec IV COMPARISON:  Ultrasound 10/05/2022 FINDINGS: There is delayed clearance radiotracer from the blood pool. Delayed clearance of radiotracer from the liver into the bile ducts. The gallbladder begins to fill at 60 minutes. The gallbladder is elongated nondistended. No counts evident within common bile duct or small  bowel over the course the study. IMPRESSION: 1. Filling of the gallbladder consistent with patent cystic duct and NOT consistent with acute cholecystitis. 2. Poor clearance radiotracer from blood pool and poor clearance of radiotracer from the liver. Favor liver dysfunction over biliary obstruction. The common bile duct is normal on ultrasound 1 day prior. The gallbladder is nondistended. Electronically Signed   By: Suzy Bouchard M.D.   On: 10/06/2022 16:52   DG Chest Port 1  View  Result Date: 10/05/2022 CLINICAL DATA:  Evaluate central venous catheter placement EXAM: PORTABLE CHEST 1 VIEW COMPARISON:  10/13/2022 FINDINGS: Signs of previous median sternotomy. 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 There is a right IJ catheter with tip in the projection of the SVC. No pneumothorax identified. Cardiac enlargement is unchanged. Unchanged appearance of diffuse reticular interstitial opacities throughout both lungs. No sign of pleural effusion. IMPRESSION: Right IJ catheter with tip in the projection of the SVC. No pneumothorax. No change in diffuse increase interstitial opacities which may reflect interstitial edema, atypical infection or chronic lung disease. Electronically Signed   By: Kerby Moors M.D.   On: 10/05/2022 14:14   US Abdomen Limited RUQ (LIVER/GB)  Result Date: 10/05/2022 CLINICAL DATA:  Hyperbilirubinemia EXAM: ULTRASOUND ABDOMEN LIMITED RIGHT UPPER QUADRANT COMPARISON:  MRI and ultrasound October of 2023. FINDINGS: Gallbladder: No gallstones. No Murphy sign. Small amount of gallbladder sludge. Mild gallbladder wall thickening, 4.3 mm. The differential diagnosis is hydrops due to poor right heart function versus acalculous cholecystitis peer Common bile duct: Diameter: 4.2 mm.  No visible stone. Liver: No focal lesion. Increased echogenicity suggesting steatosis. Trace amount of ascites. Portal vein is patent on color Doppler imaging with to and fro flow directionality. Other: None. IMPRESSION: 1. Small amount of gallbladder sludge. No gallstones. Mild gallbladder wall thickening. The differential diagnosis is hydrops due to poor right heart function versus acalculous cholecystitis. 2. Hepatic steatosis. Trace amount of ascites. 3. To and fro flow directionality in the portal vein. This constellation of findings is consistent with chronic hepatic parenchymal disease. Electronically Signed   By: Nelson Chimes M.D.   On: 10/05/2022 09:24   CT HEAD WO  CONTRAST (5MM)  Result Date: 10/12/2022 CLINICAL DATA:  Mental status change EXAM: CT HEAD WITHOUT CONTRAST TECHNIQUE: Contiguous axial images were obtained from the base of the skull through the vertex without intravenous contrast. RADIATION DOSE REDUCTION: This exam was performed according to the departmental dose-optimization program which includes automated exposure control, adjustment of the mA and/or kV according to patient size and/or use of iterative reconstruction technique. COMPARISON:  MRI brain February 01, 2022 and head CT January 31, 2020 FINDINGS: Brain: Tiny remote left cerebellar infarct. No evidence of acute infarction, hemorrhage, hydrocephalus, extra-axial collection or mass lesion/mass effect. Vascular: No hyperdense vessel or unexpected calcification. Skull: Similar appearance of the left inferior orbital wall fracture with herniation of fat through the fracture defect. Negative for acute fracture or focal lesion. Sinuses/Orbits: Visualized portions of the paranasal sinuses predominantly clear. Other: None IMPRESSION: 1. No acute intracranial abnormality. 2. Tiny remote left cerebellar infarct. 3. Similar appearance of the left inferior orbital wall fracture with herniation of fat through the fracture defect. Electronically Signed   By: Dahlia Bailiff M.D.   On: 10/05/2022 19:14   DG Chest Port 1 View  Result Date: 10/15/2022 CLINICAL DATA:  Shortness of breath. EXAM: PORTABLE CHEST 1 VIEW COMPARISON:  09/25/2022 FINDINGS: Stable enlarged cardiac silhouette, median sternotomy wires, prosthetic mitral valve and left  atrial clip. Stable prominence of the interstitial markings with no Kerley lines. Possible small left pleural effusion. Thoracic spine degenerative changes. IMPRESSION: 1. Stable cardiomegaly and chronic interstitial lung disease with possible superimposed interstitial edema. 2. Possible small left pleural effusion. Electronically Signed   By: Claudie Revering M.D.   On: 10/15/2022 16:06    DG Chest Port 1 View  Result Date: 09/25/2022 CLINICAL DATA:  Shortness of breath, renal failure. EXAM: PORTABLE CHEST 1 VIEW COMPARISON:  Chest radiograph dated 09/16/2022. FINDINGS: The heart is enlarged. Mild-to-moderate bilateral interstitial and airspace opacities are redemonstrated. There is no pleural effusion or pneumothorax. Degenerative changes are seen in the spine. A mitral valve prosthesis and atrial appendage clip, and median sternotomy wires are redemonstrated. IMPRESSION: Cardiomegaly with mild-to-moderate bilateral interstitial and airspace opacities, likely pulmonary edema. Electronically Signed   By: Zerita Boers M.D.   On: 09/25/2022 10:23   ECHOCARDIOGRAM LIMITED  Result Date: 09/23/2022    ECHOCARDIOGRAM LIMITED REPORT   Patient Name:   Chelsea Ramsey Date of Exam: 09/23/2022 Medical Rec #:  [id number]                 Height:       64.0 in Accession #:    [id number]                Weight:       243.4 lb Date of Birth:  [birth year]                 BSA:          2.127 m Patient Age:    26 years                  BP:           109/71 mmHg Patient Gender: F                         HR:           96 bpm. Exam Location:  [facility] Procedure: Limited Echo Indications:     CHF I50.9  History:         Patient has prior history of Echocardiogram examinations, most                  recent 09/17/2022. CHF, Prior Cardiac Surgery, COPD; Risk                  Factors:Hypertension.                   Mitral Valve: bioprosthetic valve valve is present in the                  mitral position.  Sonographer:     Sherrie Sport Referring Phys:  [id number] [place] Chelsea Ramsey Diagnosing Phys: Serafina Royals MD  Sonographer Comments: Technically difficult study due to poor echo windows, suboptimal apical window and suboptimal parasternal window. IMPRESSIONS  1. Left ventricular ejection fraction, by estimation, is 35 to 40%. The left ventricle has moderately decreased function. The left ventricle demonstrates  global hypokinesis. The left ventricular internal cavity size was mildly dilated.  2. There is LV septal flattenting in systole and diastole and dilated hepatic vein consistant with severe phtn. Right ventricular systolic function is moderately reduced. The right ventricular size is moderately enlarged. Mildly increased right ventricular wall thickness. There is severely elevated pulmonary artery systolic pressure.  3. Left atrial size was severely dilated.  4. Right atrial size was severely dilated.  5. Cannot rule out mild stenosis. doppler difficult to interpret. The mitral valve is degenerative. Mild mitral valve regurgitation. 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  6. The tricuspid valve is abnormal. Tricuspid valve regurgitation is severe.  7. The aortic valve is calcified. Aortic valve regurgitation is moderate. FINDINGS  Left Ventricle: Left ventricular ejection fraction, by estimation, is 35 to 40%. The left ventricle has moderately decreased function. The left ventricle demonstrates global hypokinesis. The left ventricular internal cavity size was mildly dilated. Right Ventricle: There is LV septal flattenting in systole and diastole and dilated hepatic vein consistant with severe phtn. The right ventricular size is moderately enlarged. Mildly increased right ventricular wall thickness. Right ventricular systolic  function is moderately reduced. There is severely elevated pulmonary artery systolic pressure. Left Atrium: Left atrial size was severely dilated. Right Atrium: Right atrial size was severely dilated. Pericardium: There is no evidence of pericardial effusion. Mitral Valve: Cannot rule out mild stenosis. doppler difficult to interpret. The mitral valve is degenerative in appearance. Mild mitral valve regurgitation. 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 MV peak gradient, 19.2 mmHg. The mean mitral valve gradient is 11.0 mmHg. Tricuspid Valve: The  tricuspid valve is abnormal. Tricuspid valve regurgitation is severe. Aortic Valve: The aortic valve is calcified. Aortic valve regurgitation is moderate. Pulmonic Valve: The pulmonic valve was normal in structure. Pulmonic valve regurgitation is mild. Aorta: The aortic root and ascending aorta are structurally normal, with no evidence of dilitation. IAS/Shunts: No atrial level shunt detected by color flow Doppler. LEFT VENTRICLE PLAX 2D LVIDd:         4.70 cm LVIDs:         3.10 cm LV PW:         0.90 cm LV IVS:        0.90 cm  RIGHT VENTRICLE RV Basal diam:  5.00 cm RV Mid diam:    6.50 cm LEFT ATRIUM         Index LA diam:    6.50 cm 3.06 cm/m   AORTA Ao Root diam: 2.90 cm MITRAL VALVE                TRICUSPID VALVE MV Area (PHT): 2.63 cm     TR Peak grad:   53.0 mmHg MV Peak grad:  19.2 mmHg    TR Vmax:        364.00 cm/s MV Mean grad:  11.0 mmHg MV Vmax:       2.19 m/s MV Vmean:      161.0 cm/s MV Decel Time: 288 msec MV E velocity: 239.00 cm/s Serafina Royals MD Electronically signed by Serafina Royals MD Signature Date/Time: 09/23/2022/4:02:55 PM    Final    ECHOCARDIOGRAM COMPLETE  Result Date: 09/17/2022    ECHOCARDIOGRAM REPORT   Patient Name:   Chelsea Ramsey Date of Exam: 09/17/2022 Medical Rec #:  [id number]                 Height:       64.0 in Accession #:    [id number]                Weight:       265.0 lb Date of Birth:  1966/10/16                 BSA:          2.205 m Patient  Age:    63 years                  BP:           93/79 mmHg Patient Gender: F                         HR:           110 bpm. Exam Location:  [facility] Procedure: 2D Echo, Color Doppler and Cardiac Doppler Indications:     I50.31 CHF-Acute Diastolic  History:         Patient has prior history of Echocardiogram examinations, most                  recent 11/05/2018. CHF, CAD, COPD; Risk Factors:Hypertension,                  Diabetes and Sleep Apnea.  Sonographer:     Charmayne Sheer Referring Phys:  [id number] Chelsea Ramsey  Diagnosing Phys: Yolonda Kida MD  Sonographer Comments: Suboptimal apical window and no subcostal window. Image acquisition challenging due to patient body habitus and Image acquisition challenging due to COPD. IMPRESSIONS  1. Left ventricular ejection fraction, by estimation, is 20 to 25%. The left ventricle has severely decreased function. The left ventricle demonstrates global hypokinesis. Left ventricular diastolic parameters are consistent with Grade III diastolic dysfunction (restrictive). There is the interventricular septum is flattened in systole and diastole, consistent with right ventricular pressure and volume overload.  2. Right ventricular systolic function is moderately reduced. The right ventricular size is moderately enlarged. Mildly increased right ventricular wall thickness.  3. Left atrial size was moderately dilated.  4. Right atrial size was severely dilated.  5. The mitral valve is abnormal. Mild to moderate mitral valve regurgitation.  6. Tricuspid valve regurgitation is severe.  7. The aortic valve is calcified. Aortic valve regurgitation is mild to moderate. Aortic valve sclerosis/calcification is present, without any evidence of aortic stenosis. Conclusion(s)/Recommendation(s): Poor windows for evaluation of left ventricular function by transthoracic echocardiography. Would recommend an alternative means of evaluation. FINDINGS  Left Ventricle: Left ventricular ejection fraction, by estimation, is 20 to 25%. The left ventricle has severely decreased function. The left ventricle demonstrates global hypokinesis. The left ventricular internal cavity size was normal in size. There is borderline concentric left ventricular hypertrophy. The interventricular septum is flattened in systole and diastole, consistent with right ventricular pressure and volume overload. Left ventricular diastolic parameters are consistent with Grade III diastolic dysfunction (restrictive). Right Ventricle: The  right ventricular size is moderately enlarged. Mildly increased right ventricular wall thickness. Right ventricular systolic function is moderately reduced. Left Atrium: Left atrial size was moderately dilated. Right Atrium: Right atrial size was severely dilated. Pericardium: There is no evidence of pericardial effusion. Mitral Valve: The mitral valve is abnormal. Mild to moderate mitral valve regurgitation. Tricuspid Valve: The tricuspid valve is grossly normal. Tricuspid valve regurgitation is severe. Aortic Valve: The aortic valve is calcified. Aortic valve regurgitation is mild to moderate. Aortic regurgitation PHT measures 233 msec. Aortic valve sclerosis/calcification is present, without any evidence of aortic stenosis. Aortic valve mean gradient measures 6.0 mmHg. Aortic valve peak gradient measures 11.3 mmHg. Aortic valve area, by VTI measures 1.14 cm. Pulmonic Valve: The pulmonic valve was normal in structure. Pulmonic valve regurgitation is mild to moderate. Aorta: The ascending aorta was not well visualized. IAS/Shunts: No atrial level shunt detected by color flow Doppler.  LEFT  VENTRICLE PLAX 2D LVIDd:         3.50 cm LVIDs:         3.20 cm LV PW:         1.20 cm LV IVS:        0.90 cm LVOT diam:     1.80 cm LV SV:         25 LV SV Index:   12 LVOT Area:     2.54 cm  RIGHT VENTRICLE RV Basal diam:  5.30 cm RV Mid diam:    5.00 cm RV S prime:     7.62 cm/s LEFT ATRIUM         Index       RIGHT ATRIUM           Index LA diam:    5.50 cm 2.49 cm/m  RA Area:     28.50 cm                                 RA Volume:   104.00 ml 47.17 ml/m  AORTIC VALVE                     PULMONIC VALVE AV Area (Vmax):    0.87 cm      PV Vmax:          0.98 m/s AV Area (Vmean):   0.90 cm      PV Vmean:         69.200 cm/s AV Area (VTI):     1.14 cm      PV VTI:           0.128 m AV Vmax:           168.00 cm/s   PV Peak grad:     3.9 mmHg AV Vmean:          116.000 cm/s  PV Mean grad:     2.5 mmHg AV VTI:             0.223 m       PR End Diast Vel: 10.24 msec AV Peak Grad:      11.3 mmHg AV Mean Grad:      6.0 mmHg LVOT Vmax:         57.60 cm/s LVOT Vmean:        40.900 cm/s LVOT VTI:          0.100 m LVOT/AV VTI ratio: 0.45 AI PHT:            233 msec  AORTA Ao Root diam: 3.10 cm MITRAL VALVE               TRICUSPID VALVE MV Area (PHT): 5.95 cm    TR Peak grad:   55.7 mmHg MV Decel Time: 128 msec    TR Vmax:        373.00 cm/s MV E velocity: 87.60 cm/s                            SHUNTS                            Systemic VTI:  0.10 m  Systemic Diam: 1.80 cm Yolonda Kida MD Electronically signed by Yolonda Kida MD Signature Date/Time: 09/17/2022/4:54:34 PM    Final    DG Chest 1 View  Result Date: 09/16/2022 CLINICAL DATA:  Worsening shortness of breath. 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 Cardiomegaly which has increased from 2021. Slight globular configuration of the heart, query pericardial effusion. Suspect small right pleural effusion. Chronic bilateral lung opacities that appears stable. No acute airspace disease or pneumothorax. On limited assessment, no acute osseous findings. IMPRESSION: 1. Increased cardiomegaly from 2021. Slight globular configuration of the heart, query pericardial effusion. 2. Suspect small right pleural effusion. 3. Stable chronic bilateral lung opacities. Electronically Signed   By: Keith Rake M.D.   On: 09/16/2022 17:21   MR ABDOMEN MRCP WO CONTRAST  Result Date: 09/16/2022 CLINICAL DATA:  56 year old female with history of jaundice. Nausea. EXAM: MRI ABDOMEN WITHOUT CONTRAST  (INCLUDING MRCP) TECHNIQUE: Multiplanar multisequence MR imaging of the abdomen was performed. Heavily T2-weighted images of the biliary and pancreatic ducts were obtained, and three-dimensional MRCP images were rendered by post  processing. COMPARISON:  No prior abdominal MRI or MRCP. Abdominal ultrasound 09/15/2022. FINDINGS: Comment: Portions of today's examination are substantially limited by patient respiratory motion. The study is also severely limited for detection and characterization of visceral and/or vascular lesions by lack of IV gadolinium. Lower chest: Severe cardiomegaly. Massively distended inferior vena cava. Hepatobiliary: No definite suspicious cystic or solid hepatic lesions are confidently identified on today's motion limited noncontrast examination. No intra or extrahepatic biliary ductal dilatation noted on MRCP images. Common bile duct measures only 3 mm in the porta hepatis. No filling defect within the common bile duct to suggest choledocholithiasis. T1 hyperintense, T2 hypointense material lies dependently within the gallbladder. Gallbladder does not appear overly distended. Accurate assessment for gallbladder wall thickening is limited on today's examination secondary to motion, but there may be some very mild gallbladder wall thickening and edema. No overt pericholecystic fluid or surrounding inflammatory changes. Pancreas: No definite pancreatic mass or peripancreatic fluid collections or inflammatory changes noted on today's noncontrast examination. No pancreatic ductal dilatation. Spleen:  Unremarkable. Adrenals/Urinary Tract: Unenhanced appearance of the kidneys and bilateral adrenal glands is normal. No hydroureteronephrosis in the visualized portions of the abdomen. Stomach/Bowel: Visualized portions are unremarkable. Vascular/Lymphatic: No aneurysm identified in the visualized abdominal vasculature. No lymphadenopathy noted in the abdomen. Other: No significant volume of ascites noted in the visualized portions of the peritoneal cavity. Musculoskeletal: No aggressive appearing osseous lesions are noted in the visualized portions of the skeleton. IMPRESSION: 1. Biliary sludge lying dependently in the  gallbladder. Gallbladder wall may be mildly edematous, but is poorly evaluated on today's motion limited examination. If there is any clinical concern for acute cholecystitis, further evaluation with right upper quadrant abdominal ultrasound or nuclear medicine hepatobiliary scan should be considered at this time. 2. No biliary tract dilatation to suggest obstruction. No evidence of choledocholithiasis. 3. Severe cardiomegaly. Electronically Signed   By: Vinnie Langton M.D.   On: 09/16/2022 05:28   US ABDOMEN LIMITED RUQ (LIVER/GB)  Result Date: 09/15/2022 CLINICAL DATA:  Elevated bilirubin EXAM: ULTRASOUND ABDOMEN LIMITED RIGHT UPPER QUADRANT COMPARISON:  CT abdomen and pelvis 12/01/2018 FINDINGS: Gallbladder: Sludge is present within the gallbladder. Small amount of pericholecystic fluid. The gallbladder wall is mildly thickened measuring 5 mm. Sonographic Murphy's sign was positive. Common bile duct: Diameter:  3 mm Liver: No focal lesion identified. Increased echogenicity. Portal vein is patent on Doppler imaging with bidirectional flow. Other: None. IMPRESSION: 1. Findings compatible with acute cholecystitis. 2. 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 Electronically Signed   By: Placido Sou M.D.   On: 09/15/2022 23:07    ECHO limited 09/23/22  1. Left ventricular ejection fraction, by estimation, is 35 to 40%. The left ventricle has moderately decreased function. The left ventricle demonstrates global hypokinesis. The left ventricular internal cavity size was mildly dilated.  2. There is LV septal flattenting in systole and diastole and dilated hepatic vein consistant with severe phtn. Right ventricular systolic function is moderately reduced. The right ventricular size is moderately enlarged. Mildly increased right ventricular wall thickness. There is severely elevated pulmonary artery systolic pressure.  3. Left atrial size was  severely dilated.  4. Right atrial size was severely dilated.  5. Cannot rule out mild stenosis. doppler difficult to interpret. The mitral valve is degenerative. Mild mitral valve regurgitation. 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  6. The tricuspid valve is abnormal. Tricuspid valve regurgitation is severe.  7. The aortic valve is calcified. Aortic valve regurgitation is moderate.    TELEMETRY reviewed by me (LT) 10/08/2022 : Atrial fibrillation rate 120s with paroxysms to the 130s/40s  EKG reviewed by me: Atrial fibrillation rate 110  Data reviewed by me (LT) 10/08/2022: critical care progress note, GI consult note, nephrology note, nursing notes, CBC, BMP, vitals, telemetry, I's and O's  Principal Problem:   Severe sepsis with acute organ dysfunction ([place]) Active Problems:   A-fib ([place])   HTN (hypertension)   Chronic anticoagulation ([place])   GAD (generalized anxiety disorder)   History of substance abuse ([place])   Insomnia   Acute renal failure ([place])   Altered mental status   Hypotension   Hepatic steatosis    ASSESSMENT AND PLAN:  Esta C. 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  She was admitted to stepdown and is being treated for septic vs cardiogenic shock with suspected urinary source, confounded by acute on chronic heart failure.  Cardiology is consulted for further assistance.  #Acute encephalopathy #Septic shock, suspected urinary source #Enterococcus cystitis UA on admission significant for WBCs, RBCs and was brown in color, limiting  evaluation of nitrites and leukocytes.  Culture growing Enterococcus. -zosyn switched to augmentin  -Recommend cautious use of IV fluids -Wean vasopressor support as tolerated, unfortunately still requiring Levophed and vasopressin today -Favor minimizing use of opioids and other sedating medicines if at all possible  #Acute on chronic hypoxic respiratory failure #Pulmonary hypertension On baseline 3-4L [place]. O2 weaned from heated HFNC to 6L HFNC today  #Acute on chronic HFrEF (EF 35-40%, G3 DD, global hypokinesis) #Rheumatic MS s/p porcine MVR 2019 BNP elevated at 785 on admission, notably less than it was on 10/25 (1800).  remains clinically volume overloaded on exam 11/9 with peripheral edema, JVD, and interstitial edema on chest x-ray.  Net +4.6 L.   -Nephrology started a low-dose of a Lasix infusion at  4 mg/h on 11/7, favor increasing this as able if her blood pressure allows. -Reinstate GDMT as her blood pressure allows with metoprolol tartrate 25 twice daily (home dose was 100 mg twice daily) and spironolactone -Diuretics on discharge 11/2: Lasix 60 mg once daily and metolazone 2.5 mg 3 times weekly in addition to daily potassium supplementation.    #Paroxysmal atrial fibrillation on Xarelto In atrial fibrillation on telemetry, tachycardic in the 120s while on two vasopressors -Hold metoprolol while requiring vasopressor support -Hold Xarelto with AKI, recommend heparin until her renal function normalizes. -In terms of rhythm control digoxin contraindicated in renal failure, sotalol is also not a great option as we start IV diuresis (electrolyte abnormalities anticipated) -as an attempt to control rate, start amiodarone bolus and infusion x 24 hours, then switch to PO dosing tomorrow.  Antiemetics for N/V.  #Acute renal failure on CKD 3 Renal function on admission severely decreased with BUN/creatinine 50/3.83 and GFR of 13.  Worsened with IV fluids.  During her prior admission with a  similar significant degree of AKI, her renal function essentially normalized after multiple days of IV diuresis. -Nephrology following, appreciate input. -on 11/9 slowly improving with BUN/Cr. 48/2.73, GFR 20  #Hyperbilirubinemia #Hepatic steatosis T. bili downtrending from 9.4, 8.6, 8.5 today.  Almost equal elevation of direct and indirect bilirubin, seen by surgery, GI and hematology during her last admission who felt this was related to underlying hepatic dysfunction from her right heart failure. No acute surgical need.  Her T. bili slowly improved after days of diuresis.  -Continue to monitor  #Elevated TSH TSH 8.9, free T4 within normal limits.  -Management per primary team  #Elevated troponin Minimally elevated with a flat trend at 51-49, in the absence of chest pain or EKG changes this is most consistent with demand/supply mismatch likely from her renal failure and not ACS   This patient's plan of care was discussed and created with Dr. Saralyn Pilar and he is in agreement.  Signed: Tristan Schroeder , PA-C 10/08/2022, 1:03 PM [facility]

## 2022-10-08 NOTE — Consult Note (Signed)
ANTICOAGULATION CONSULT NOTE  Pharmacy Consult for heparin gtt Indication: atrial fibrillation  Patient Measurements: Height: '5\' 4"'$  (162.6 cm) Weight: 113.1 kg (249 lb 5.4 oz) IBW/kg (Calculated) : 54.7 Heparin Dosing Weight: 79.8 kg  Labs: Recent Labs    10/06/22 0215 10/06/22 0418 10/06/22 2050 10/07/22 0547 10/07/22 1757 10/08/22 0450 10/08/22 1557  HGB 9.2*  --   --  8.8*  --  8.4*  --   HCT 32.4*  --   --  30.3*  --  29.2*  --   PLT 307  --   --  309  --  337  --   APTT 87*   < > 74* 159* 129* 140* 69*  LABPROT 20.8*  --   --   --   --   --   --   INR 1.8*  --   --   --   --   --   --   HEPARINUNFRC 0.27*   < > 0.14* 0.48 0.35 0.30 0.10*  CREATININE 3.59*  --  3.29* 3.03*  --  2.73*  --    < > = values in this interval not displayed.    Estimated Creatinine Clearance: 28.7 mL/min (A) (by C-G formula based on SCr of 2.73 mg/dL (H)).  Medications:  Heparin Dosing Weight: 79.8 kg PTA: Xarelto 20 QHS (last dose on 11/03 PTA) Inpatient: [facility] 5k Q8h  >> heparin gtt   Assessment: 56yo F w/ h/o drug and alcohol abuse, HTN, HLD, MDD and AFib, who was admitted to [facility] on 10/29/2022 for severe sepsis with acute organ dysfunction. Pharmacy consulted to transition of prophylactic heparin to infusion. Pt xarelto for Afib PTA is on hold ISO severe AKI until renal fxn normalizes.  Date    Time    aPTT/HL         Rate/Comment 11/7     0215    87  / 0.27         Thera; 1250u/hr 11/7     1219    112 / 0.32        Thera; 1250u/hr 11/7     2050    74  / 0.14         Subth; 1250u/hr 11/7     0547    159 / 0.48        Thera; 1450u/hr 11/9 1557 69 / 0.1 Subth;  1100 u/hr   Goal of Therapy:  Heparin level 0.3-0.7 units/ml Monitor platelets by anticoagulation protocol: Yes   Plan:  Will use heparin level for heparin monitor as that is the preferred lab for heparin monitoring. No false elevation of heparin level shown/noted at this time. Elevated aPTT suggest patient might be at a  higher bleed risk. Heparin level was 0.3-0.35 with the heparin infusion running at 1350 units/hr. Will increase heparin infusion back to 1350 units/hr. No need to monitor aPTT levels. Recheck heparin level in 8 hours. CBC daily while on heparin.    Oswald Hillock, PharmD, BCPS 10/08/2022 4:34 PM

## 2022-10-08 NOTE — Progress Notes (Addendum)
Pt's father and pt's significant other updated.  We discussed pt's critical illness with multiorgan failure ultimately stemming from severe/end stage combined systolic & diastolic biventricular CHF.  Discussed that pt requiring multiple pressors to maintain BP, amio for A.fib, and IV infusion of diuretics, and likely needs inotropes (but unable to utilize due to shock). The pt continues to slowly improve with full supportive care.  The concern is that given severity of CHF, even with optimization this hospitalization, there is extremely high likelihood that pt will have frequent CHF exacerbations and recurrent hospitalizations with poor quality of life.  Palliative Care is following, and pt and her mother (Nurse, adult by pt) seem to be leaning towards comfort approach.  Pt's father states he understands this and would be in agreement with comfort approach, he doesn't want her to suffer.  They are very appreciative of update, all questions answered.   Darel Hong, AGACNP-BC [facility] Prefer epic messenger for cross cover needs If after hours, please call E-link

## 2022-10-08 NOTE — Progress Notes (Signed)
Daily Progress Note   Patient Name: Chelsea Ramsey       Date: 10/08/2022 DOB: 02/02/1966  Age: 56 y.o. MRN#: [id number] Attending Physician: Ottie Glazier, MD Primary Care Physician: Emelia Loron, NP Admit Date: 10/03/2022 Length of Stay: 4 days  Reason for Consultation/Follow-up: Establishing goals of care  HPI/Patient Profile:  56 y.o. female  with past medical history of morbid obesity, history of drug use including cocaine and THC, history of EtOH abuse, neuropathy, depression, atrial fibrillation on Xarelto, hyperlipidemia, hypertension, who presented on 10/18/2022 for evaluation or progressively worsening shortness of breath.    Noted recent admissions for similar presentation, discharge most recently on 11/2.  Prior admission records reviewed.  Patient has had hyperbilirubinemia secondary to congestive hepatopathy.  Prior admissions, GI and hematology/oncology were consulted and evaluation was unrevealing.  Possible acalculous cholecystitis was in the differential, but patient poor candidate for surgery or procedures given poor cardiac function and pulmonary hypertension, morbid obesity also a complicating factor.  PCCM assumed care to the circulatory shock requiring vasopressors.   She was admitted on 10/20/2022 with severe sepsis with acute organ dysfunction, altered mental status, A-fib on chronic anticoagulation, hypertension, history of substance abuse, circulatory shock, hepatic steatosis, and others.    PMT was consulted for [place] conversations.  Subjective:   Subjective: Chart Reviewed. Updates received. Patient Assessed. Created space and opportunity for patient  and family to explore thoughts and feelings regarding current medical situation.  Today's Discussion:  Today I met with the patient at the bedside.  She was more awake today, currently eating.  No family is present.  She states she is feeling better overall today. She is more interactive. I asked her to tell  me about her health and why she is here and she could not. Still not understanding of current health state and ramifications of decisions, despite continued re-education. I asked if her mom had been here and she said no. She did give me permission to speak with her mom.  We again discussed the current situation with a possible spouse versus significant other, patient's mother, other complex social and family dynamics.  She again made it clear that she wants her mother to be her surrogate decision-maker.  Due to the patient's significant other claiming to be husband and requesting to be called for consents, and other complexities I informed her I feel it is best to have advanced directives completed and naming her mother as her legal decision maker.  She understands.  She states that currently she would want to be her own decision-maker, until she can no longer make her decisions.  I am not sure if she has the ability to understand the ramifications of decisions that she makes.  However, I feel she does have the capacity to designate a surrogate.  She does understand that she is very ill and has a lot of health problems.  She knows that her liver and heart are having problems.  Continued work with spiritual care on [facility] paperwork. Patient has been clear over several days she would want her mom to make her healthcare decisions for her if she is unable to make her healthcare decisions.  Overall the patient feels better today.  She states she is not having any shortness of breath, minimal to no pain.  No nausea or vomiting.  She does have increased oxygen requirements and is currently on heated high flow nasal. I explained that she is very very sick and we are worried about her.  She gave me permission to call her mother.  I called the patient's Mom Jerilee Field, who that patient has consistently said she wants to make her decisions if she cannot. I updated the patient's mom about her current clinical  situation, including in depth explanations about her decompensated heart failure with subsequent AKI, hepatopathy, severe sepsis on presors, increased oxygen requirements, etc. The patient's mom was very tearful saying "I cannot take it when I come to see her, she's my baby and I can't stand to see her dying in the bed" and kept repeating "I don't want her to suffer anymore!  Let my baby go!" I explained options for continued current care with no escalation versus comfort care and she again repeated "Let my baby go".   She shares they recently lost their son and another child is also very ill. We appreciated the significant amount of loss they have suffered recently. I told her of her daughter's wishes for her mom to make her decisions and she shares that the patient has always been a fearful person and remains so today. She's fearful of death and has always requested her mother to make her decisions. She again repeated she cannot stand to see her continuing to suffer.  I told her I would speak with the ICU doctor about our conversation and see how we can best work with the patient and her family to honor her wishes. I expressed my desire to see how the patient feels about this.  I spoke with Dr. Laural Roes about our conversation and he shares that he feels the whole medical team thinks she is very seriously sick and unlikely to survive long. He appreciates the mother's wishes, accepts her as surrogate (based on multiple conversations with the patient) and states they will work on possible transition to comfort.  I provided emotional and general support through therapeutic listening, empathy, sharing of stories, and other techniques. I answered all questions and addressed all concerns to the best of my ability.  Review of Systems  Constitutional:        No significant pain in general  Respiratory:  Negative for shortness of breath (Improved).   Gastrointestinal:  Negative for abdominal pain, nausea and  vomiting.   Objective:   Vital Signs:  BP (!) 79/67   Pulse (!) 129   Temp 98.1 F (36.7 C) (Axillary)   Resp (!) 24   Ht _0  (1.626 m)   Wt 113.1 kg   LMP 11/08/2009 Comment: menopause  SpO2 98%   BMI 42.80 kg/m   Physical Exam: Physical Exam Vitals and nursing note reviewed.  Constitutional:      General: She is sleeping. She is not in acute distress.    Appearance: She is ill-appearing.  HENT:     Head: Normocephalic and atraumatic.  Cardiovascular:     Rate and Rhythm: Tachycardia present. Rhythm irregular.  Pulmonary:     Effort: Pulmonary effort is normal. No respiratory distress.     Comments: HFNC present Abdominal:     General: Bowel sounds are normal.     Palpations: Abdomen is soft.  Skin:    General: Skin is warm and dry.  Neurological:     Mental Status: She is easily aroused.  Psychiatric:        Mood and Affect: Mood normal.        Behavior: Behavior normal.     Palliative Assessment/Data: 20-30%    Existing Vynca/ACP Documentation: GOC document dated 09/23/2022  Assessment &  Plan:   Impression: Present on Admission:  Severe sepsis with acute organ dysfunction (HCC)  Insomnia  HTN (hypertension)  GAD (generalized anxiety disorder)  A-fib (HCC)  Acute renal failure (HCC)  History of substance abuse ([place])  56 year old female with multiple chronic comorbidities and acute presentations described above.  She is currently quite ill requiring pressors including Levophed and vasopressin.  She is on high flow nasal cannula for oxygen support.  She remains in A-fib with RVR.  The patient seems to have an element of capacity today.  She states if she cannot make her decision she would want her mother to make her decisions, not the "spouse versus significant other".  Patient's mother today is in significant distress and doesn't want her daughter to continue to suffer. Wants [place] to "let my baby go" and seems to be leaning toward comfort care. ICU team  is aware and plans to discuss further with her. Overall prognosis poor given multisystem organ failure.   SUMMARY OF RECOMMENDATIONS   Remain DNR Continued patient and family emotional support ICU MD plans to meet with patient and her mother tomorrow about how to proceed PMT will follow  Symptom Management:  Per primary team PMT is available to assist as needed  Code Status: DNR  Prognosis: Unable to determine  Discharge Planning: To Be Determined  Discussed with: Patient, medical team, nursing team  Thank you for allowing [place] to participate in the care of [name] PMT will continue to support holistically.  Billing based on MDM: High  Problems Addressed: One acute or chronic illness or injury that poses a threat to life or bodily function  Amount and/or Complexity of Data: Category 3:Discussion of management or test interpretation with external physician/other qualified health care professional/appropriate source (not separately reported)  Risks: Decision not to resuscitate or to de-escalate care because of poor prognosis  Walden Field, NP Palliative Medicine Team  Team Phone # [phone] (Nights/Weekends)  07/29/2021, 8:17 AM

## 2022-10-08 NOTE — Progress Notes (Signed)
NAME:  Chelsea Ramsey, MRN:  [id number], DOB:  26-Apr-1966, LOS: 4 ADMISSION DATE:  10/07/2022, CONSULTATION DATE:  10/05/2022 REFERRING MD:  Neomia Glass NP  REASON FOR CONSULT:  Hypotension   HPI  56 y.o female with significant PMH of drug-induced gout of left foot (03/01/2018), Anxiety, Arthritis, Asthma,COPD, Coronary artery disease, HFrEF (current EF 35-40%) DM, Fibromyalgia, GERD, Hypertension, Pulmonary HTN,  Rheumatic fever/heart disease, paroxysmal atrial fibrillation on Xarelto, COPD w/ chronic respiratory failure on baseline 3 L, ongoing tobacco use, TIA/CVA, and Sleep apnea. who presented to the ED with chief complaints of generalized weakness shortness of breath and itching.  On review of her chart, patient was recently discharged on 10/17 following admission for acute on chronic CHF exacerbation, transaminitis, AKI and altered mental status.  Per ED reports, since discharge patient has been progressively weak and not able to walk or take her medication as prescribed. Per EMS report, patient fell from her wheelchair but denied hitting her head or LOC.    10/08/22-patient continues to have arrythmia with anasarca, requires multiple vasopressors. Starting amiodarone as per cardiology.     Past Medical History  drug-induced gout of left foot (03/01/2018), Anxiety, Arthritis, Asthma,COPD, Coronary artery disease, HFrEF (current EF 35-40%) DM, Fibromyalgia, GERD, Hypertension, Pulmonary HTN,  Rheumatic fever/heart disease, paroxysmal atrial fibrillation on Xarelto, COPD w/ chronic respiratory failure on baseline 3 L, ongoing tobacco use, TIA/CVA, and Sleep apnea  Significant Hospital Events   11/5: Admit to hospitalist service with sepsis of unknown origin and AMS. 11/6: Remained hypotensive despite IVF and started on pressors. PCCM consulted 10/06/22- patient confused with encephalopathy, rectal tube with loose stools 10/07/22- mentation has improved, blood work improved with  improved renal function and improved transaminitis, GI and Surgery have seen patient and feel gall bladder can be addressed on outpatient.   Consults:  PCCM Cardiology  Procedures:  None  Significant Diagnostic Tests:  11/5: Chest Xray> 11/5: Noncontrast CT head>  Micro Data:  11/5: SARS-CoV-2 PCR> negative 11/5: Influenza PCR> negative 11/5: Blood culture x2> 11/5: Urine Culture> 11/5: MRSA PCR>>   Antimicrobials:  Vancomycin 11/5>> Cefepime 11/5>>  OBJECTIVE  Blood pressure (!) 79/67, pulse (!) 129, temperature 98.1 F (36.7 C), temperature source Axillary, resp. rate (!) 24, height '5\' 4"'$  (1.626 m), weight 113.1 kg, last menstrual period 11/08/2009, SpO2 98 %.    FiO2 (%):  [45 %] 45 %   Intake/Output Summary (Last 24 hours) at 10/08/2022 1043 Last data filed at 10/08/2022 0600 Gross per 24 hour  Intake 1289.17 ml  Output 1525 ml  Net -235.83 ml    Filed Weights   10/05/22 1700 10/07/22 0456 10/08/22 0500  Weight: 106.3 kg 105.9 kg 113.1 kg    Physical Examination  GENERAL: 56 year-old critically ill patient lying in the bed with no acute distress.  EYES: Pupils equal, round, reactive to light and accommodation. No scleral icterus. Extraocular muscles intact.  HEENT: Head atraumatic, normocephalic. Oropharynx and nasopharynx clear.  NECK:  Supple, no jugular venous distention. No thyroid enlargement, no tenderness.  LUNGS: Normal breath sounds bilaterally, no wheezing, rales,rhonchi or crepitation. No use of accessory muscles of respiration.  CARDIOVASCULAR: S1, S2 normal. No murmurs, rubs, or gallops.  ABDOMEN: Soft, nontender, nondistended. Bowel sounds present. No organomegaly or mass.  EXTREMITIES: Upper and lower extremities are atraumatic in appearance without tenderness or deformity. Mild trace edema of bilateral lower extremities. Unable to assess muscle strength i. Capillary refill is less than 3 seconds in all  extremities. Pulses palpable.  NEUROLOGIC:The  patient is  lethargic but arouses easily  with somewhat slurred speech. Moves all extremities. Sensation is intact bilaterally. Reflexes 2+ bilaterally.  intact. Gait not checked.  PSYCHIATRIC: The patient is drowsy, oriented to self only SKIN: No obvious rash, lesion, or ulcer.   Labs/imaging that I havepersonally reviewed  (right click and "Reselect all SmartList Selections" daily)     Labs   CBC: Recent Labs  Lab 10/21/2022 1602 10/05/22 0306 10/06/22 0215 10/07/22 0547 10/08/22 0450  WBC 12.0* 12.7* 13.5* 12.8* 12.1*  NEUTROABS 9.4*  --   --   --   --   HGB 8.6* 9.0* 9.2* 8.8* 8.4*  HCT 30.7* 32.5* 32.4* 30.3* 29.2*  MCV 88.5 88.8 86.9 86.6 86.4  PLT 258 290 307 309 337     Basic Metabolic Panel: Recent Labs  Lab 10/24/2022 1602 10/05/22 0306 10/06/22 0215 10/06/22 2050 10/07/22 0547 10/08/22 0450  NA 137 137 136 139 139 141  K 5.5* 5.2* 4.6 4.1 3.8 3.3*  CL 90* 93* 95* 97* 97* 99  CO2 34* 32 '30 29 30 30  '$ GLUCOSE 64* 93 101* 105* 98 124*  BUN 50* 53* 51* 53* 48* 48*  CREATININE 3.83* 3.87* 3.59* 3.29* 3.03* 2.73*  CALCIUM 9.3 9.0 8.5* 8.8* 8.7* 9.0  MG 2.2  --  1.9  --  1.7 2.3  PHOS  --   --  5.5*  --  4.6 3.8    GFR: Estimated Creatinine Clearance: 28.7 mL/min (A) (by C-G formula based on SCr of 2.73 mg/dL (H)). Recent Labs  Lab 10/24/2022 1707 10/05/22 0306 10/05/22 1357 10/06/22 0215 10/07/22 0547 10/08/22 0450  PROCALCITON 0.69 0.53  --  0.57 0.42  --   WBC  --  12.7*  --  13.5* 12.8* 12.1*  LATICACIDVEN 2.1* 1.8 1.3  --   --   --      Liver Function Tests: Recent Labs  Lab 10/21/2022 1602 10/05/22 0306 10/06/22 0215 10/07/22 0547 10/08/22 0450  AST 70* 73* 70* 60* 65*  ALT 36 40 40 39 39  ALKPHOS 93 91 91 81 82  BILITOT 9.0* 9.4* 9.4* 8.6* 8.5*  PROT 6.9 7.1 7.0 6.8 6.7  ALBUMIN 2.6* 2.8* 2.7* 2.6* 2.6*    No results for input(s): "LIPASE", "AMYLASE" in the last 168 hours. Recent Labs  Lab 10/05/22 0306  AMMONIA 67*     ABG     Component Value Date/Time   PHART 7.39 10/05/2022 1401   PCO2ART 54 (H) 10/05/2022 1401   PO2ART 76 (L) 10/05/2022 1401   HCO3 32.7 (H) 10/05/2022 1401   ACIDBASEDEF 0.2 12/20/2017 2033   O2SAT 64.5 10/06/2022 0418     Coagulation Profile: Recent Labs  Lab 10/05/22 1419 10/06/22 0215  INR 1.5* 1.8*     Cardiac Enzymes: No results for input(s): "CKTOTAL", "CKMB", "CKMBINDEX", "TROPONINI" in the last 168 hours.  HbA1C: Hemoglobin A1C  Date/Time Value Ref Range Status  03/08/2020 03:52 PM 6.7 (A) 4.0 - 5.6 % Final  08/21/2014 12:00 AM 5.2  Final  06/03/2012 04:52 AM 4.5 4.2 - 6.3 % Final    Comment:    The American Diabetes Association recommends that a primary goal of therapy should be <7% and that physicians should reevaluate the treatment regimen in patients with HbA1c values consistently >8%.    Hgb A1c MFr Bld  Date/Time Value Ref Range Status  09/15/2022 12:59 PM 4.5 (L) 4.8 - 5.6 % Final  Comment:    (NOTE) Pre diabetes:          5.7%-6.4%  Diabetes:              >6.4%  Glycemic control for   <7.0% adults with diabetes   08/24/2019 04:54 PM 6.2 (H) 4.8 - 5.6 % Final    Comment:    (NOTE)         Prediabetes: 5.7 - 6.4         Diabetes: >6.4         Glycemic control for adults with diabetes: <7.0     CBG: Recent Labs  Lab 10/07/22 1559 10/07/22 1930 10/07/22 2335 10/08/22 0355 10/08/22 0739  GLUCAP 118* 122* 130* 129* 114*     Review of Systems:   UNABLE TO OBTAIN PATIENT IS ALTERED  Past Medical History  She,  has a past medical history of Acute drug-induced gout of left foot (03/01/2018), Allergy, Anxiety, Arthritis, Asthma, CHF (congestive heart failure) ([place]), COPD (chronic obstructive pulmonary disease) ([place]), Coronary artery disease, Diabetes mellitus without complication ([place]), Dysrhythmia, Fibromyalgia, GERD (gastroesophageal reflux disease), Hypertension, Pneumonia, PUD (peptic ulcer disease), Pulmonary HTN ([place]), Rheumatic  fever/heart disease, and Sleep apnea.   Surgical History    Past Surgical History:  Procedure Laterality Date   ADENOIDECTOMY     COLONOSCOPY WITH PROPOFOL N/A 11/06/2019   Procedure: COLONOSCOPY WITH PROPOFOL;  Surgeon: Virgel Manifold, MD;  Location: [facility] ENDOSCOPY;  Service: Endoscopy;  Laterality: N/A;  2 day prep    COLONOSCOPY WITH PROPOFOL N/A 06/29/2022   Procedure: COLONOSCOPY WITH PROPOFOL;  Surgeon: Lin Landsman, MD;  Location: [facility] ENDOSCOPY;  Service: Gastroenterology;  Laterality: N/A;   CORONARY ARTERY BYPASS GRAFT     June 27 2018   ESOPHAGOGASTRODUODENOSCOPY (EGD) WITH PROPOFOL N/A 05/25/2018   Procedure: ESOPHAGOGASTRODUODENOSCOPY (EGD) WITH PROPOFOL;  Surgeon: Lucilla Lame, MD;  Location: [facility] ENDOSCOPY;  Service: Endoscopy;  Laterality: N/A;   ESOPHAGOGASTRODUODENOSCOPY (EGD) WITH PROPOFOL N/A 11/06/2019   Procedure: ESOPHAGOGASTRODUODENOSCOPY (EGD) WITH PROPOFOL;  Surgeon: Virgel Manifold, MD;  Location: [facility] ENDOSCOPY;  Service: Endoscopy;  Laterality: N/A;   ESOPHAGOGASTRODUODENOSCOPY (EGD) WITH PROPOFOL N/A 06/29/2022   Procedure: ESOPHAGOGASTRODUODENOSCOPY (EGD) WITH PROPOFOL;  Surgeon: Lin Landsman, MD;  Location: [facility] ENDOSCOPY;  Service: Gastroenterology;  Laterality: N/A;   GIVENS CAPSULE STUDY N/A 07/22/2022   Procedure: GIVENS CAPSULE STUDY;  Surgeon: Lin Landsman, MD;  Location: [facility] ENDOSCOPY;  Service: Gastroenterology;  Laterality: N/A;   MITRAL VALVE REPLACEMENT     MULTIPLE TOOTH EXTRACTIONS     TONSILLECTOMY     TOTAL KNEE ARTHROPLASTY Right 09/04/2016   Procedure: TOTAL KNEE ARTHROPLASTY; with lateral release;  Surgeon: Earlie Server, MD;  Location: [facility];  Service: Orthopedics;  Laterality: Right;     Social History   reports that she has been smoking cigarettes. She has been smoking an average of .1 packs per day. She has never used smokeless tobacco. She reports that she does not currently use drugs. She reports that she  does not drink alcohol.   Family History   Her family history includes Asthma in her mother; Breast cancer in her maternal aunt; COPD in her mother; Cancer in her mother; Congestive Heart Failure in her father; Rheum arthritis in her mother.   Allergies Allergies  Allergen Reactions   Amiodarone Nausea And Vomiting   Aspirin Swelling   Flexeril [Cyclobenzaprine] Swelling   Trazamine [Trazodone & Diet Manage Prod] Nausea And Vomiting   Codeine Rash  Tramadol Rash     Home Medications  Prior to Admission medications   Medication Sig Start Date End Date Taking? Authorizing Provider  albuterol (VENTOLIN HFA) 108 (90 Base) MCG/ACT inhaler Inhale 1-2 puffs into the lungs every 6 (six) hours as needed for wheezing or shortness of breath.    [provider]  allopurinol (ZYLOPRIM) 100 MG tablet Take 1 tablet (100 mg total) by mouth 2 (two) times daily. 03/05/20   Milinda Pointer, MD  Cholecalciferol (VITAMIN D) 50 MCG (2000 UT) CAPS Take 1 capsule (2,000 Units total) by mouth daily. 06/19/21   Alisa Graff, FNP  colchicine 0.6 MG tablet Take 1 tablet (0.6 mg total) by mouth daily. As directed 03/05/20   Milinda Pointer, MD  diazepam (VALIUM) 10 MG tablet Take 10 mg by mouth 2 (two) times daily as needed for anxiety. 07/16/22   [provider]  docusate sodium (COLACE) 100 MG capsule Take 100 mg by mouth 2 (two) times daily as needed for mild constipation or moderate constipation.    [provider]  empagliflozin (JARDIANCE) 10 MG TABS tablet Take 10 mg by mouth daily.    [provider]  furosemide (LASIX) 20 MG tablet Take 3 tablets (60 mg total) by mouth daily. 10/02/22 11/01/22  Wyvonnia Dusky, MD  gabapentin (NEURONTIN) 600 MG tablet Take 0.5 tablets (300 mg total) by mouth 2 (two) times daily. Patient taking differently: Take 600 mg by mouth 3 (three) times daily. 12/06/18   Vaughan Basta, MD  ipratropium-albuterol (DUONEB) 0.5-2.5 (3)  MG/3ML SOLN INHALE THE CONTENTS OF 1 VIAL(3MLS) VIA NEBULIZER 3 TIMES DAILY AS NEEDED 10/24/19   Carles Collet M, PA-C  Iron-FA-B Cmp-C-Biot-Probiotic (FUSION PLUS) CAPS Take 1 capsule by mouth daily. Patient not taking: Reported on 09/16/2022 06/25/22   Lin Landsman, MD  lactulose (CHRONULAC) 10 GM/15ML solution Take 45 mLs (30 g total) by mouth 2 (two) times daily. 10/01/22 10/31/22  Wyvonnia Dusky, MD  metolazone (ZAROXOLYN) 2.5 MG tablet Take 1 tablet (2.5 mg total) by mouth every Monday, Wednesday, and Friday. 10/02/22 11/01/22  Wyvonnia Dusky, MD  metoprolol tartrate (LOPRESSOR) 50 MG tablet Take 1 tablet (50 mg total) by mouth 2 (two) times daily. 10/01/22 10/31/22  Wyvonnia Dusky, MD  nitroGLYCERIN (NITROSTAT) 0.4 MG SL tablet Place 1 tablet (0.4 mg total) under the tongue every 5 (five) minutes as needed for chest pain. 06/19/21   Alisa Graff, FNP  omeprazole (PRILOSEC) 40 MG capsule Take 40 mg by mouth daily.    [provider]  oxyCODONE ER 9 MG C12A Take 9 mg by mouth 2 (two) times daily.    [provider]  potassium chloride (KLOR-CON) 10 MEQ tablet Take 4 tablets (40 mEq total) by mouth 2 (two) times daily. And additional 4mq on metolazone days Patient taking differently: Take 40 mEq by mouth See admin instructions. Take 4 tablets (468m) by mouth twice daily - take an additional 2 tablets (2069m by mouth when taking metolazone 01/05/22   HacAlisa GraffNP  promethazine (PHENERGAN) 12.5 MG tablet Take 1 tablet (12.5 mg total) by mouth every 8 (eight) hours as needed for nausea or vomiting. Patient taking differently: Take 12.5 mg by mouth 2 (two) times daily as needed for nausea or vomiting. 02/20/20   Bacigalupo, AngDionne BucyD  rifaximin (XIFAXAN) 550 MG TABS tablet Take 1 tablet (550 mg total) by mouth 2 (two) times daily. 10/01/22 10/31/22  WilWyvonnia DuskyD  rivaroxaban (XARELTO) 20 MG TABS tablet Take 20 mg by mouth daily with supper.     [provider]  rosuvastatin (CRESTOR) 10 MG tablet Take 10 mg by mouth daily.    [provider]  spironolactone (ALDACTONE) 25 MG tablet Take 0.5 tablets (12.5 mg total) by mouth daily. 10/02/22 11/01/22  Wyvonnia Dusky, MD  Scheduled Meds:  amoxicillin-clavulanate  1 tablet Oral Q12H   Chlorhexidine Gluconate Cloth  6 each Topical Daily   feeding supplement  237 mL Oral TID BM   lactulose  10 g Oral BID   multivitamin with minerals  1 tablet Oral Daily   pantoprazole  80 mg Oral Daily   potassium chloride  40 mEq Oral BID   rifaximin  550 mg Oral BID   rosuvastatin  10 mg Oral Daily   Continuous Infusions:  sodium chloride     amiodarone 60 mg/hr (10/08/22 0927)   Followed by   amiodarone     furosemide (LASIX) 200 mg in dextrose 5 % 100 mL (2 mg/mL) infusion 4 mg/hr (10/08/22 0600)   heparin 1,100 Units/hr (10/08/22 0722)   norepinephrine (LEVOPHED) Adult infusion 6 mcg/min (10/08/22 0600)   vasopressin 0.03 Units/min (10/08/22 0600)   PRN Meds:.acetaminophen **OR** acetaminophen, docusate sodium, fentaNYL (SUBLIMAZE) injection, hydrALAZINE, LORazepam, melatonin, metoprolol tartrate, nitroGLYCERIN, senna-docusate  Active Hospital Problem list     Assessment & Plan:  Acute on Chronic  Hypoxic Respiratory Failure secondary to Acute on chronic Decompensated HFrEF, AECOPD and ?underlying Pneumonia PMHx: COPD w/ chronic respiratory failure on baseline 3 L, ongoing tobacco use , OSA on CPAP -Supplemental O2 as needed to maintain O2 saturations 88 to 92% -Consider NIPPV/BiPAP, wean as tolerated -High risk for intubation -Follow intermittent ABG and chest x-ray as needed -As needed bronchodilators -Encourage smoking cessation  Sepsis with septic shock -urinary source - PRESENT ON ADMISSION Lactic: 2.1, Baseline PCT: 0.69, UA:? UTI , CXR: ? Pneumonia Initial interventions/workup included: 3 L of NS/LR & Cefepime/ Vancomycin/ meets SIRS criteria: Shock Index  (SI)2 -Supplemental oxygen as needed, to maintain SpO2 > 90% -F/u cultures, trend lactic/ PCT -Monitor WBC/ fever curve -IV antibiotics: cefepime & vancomycin -Gentle IVF hydration as needed -Pressors for MAP goal >65 -Strict I/O's   Acute on Chronic Decompensated HFrEF (current EF 20-25%) PAF PMHx: CAD, HLD, HTNsevere pulmonary hypertension, RV dysfunction,Rheumatic mitral valve stenosis s/p MVR 2019  -BNP on admission was elevated at 558.5 with small probable interstitial edema and small left pleural effusion on cxr. -Continuous cardiac monitoring -Diuresis as BP and renal function permits ~ currently holding due to AKI and hypotension -Hold metoprolol and Spironolactone for now -Hold Xarelto -Continue midodrine -Cardiology consult if appropriate   AKI on CKD Stage III Hyperkalemia -Monitor I&O's / urinary output -Follow BMP -Ensure adequate renal perfusion -Avoid nephrotoxic agents as able -Replace electrolytes as indicated   Acute Metabolic Encephalopathy ~ Hx: TIA/CVA, polypharmacy on multiple sedating meds -Provide supportive care -Avoid sedating meds as able -CT Head  negative for acute intracranial abnormality   Diabetes mellitus HgbA1c  -CBG's Q4; Target range of 140 to 180 -SSI -Follow ICU Hypo/Hyperglycemia protocol   Best practice:  Diet:  NPO Pain/Anxiety/Delirium protocol (if indicated): No VAP protocol (if indicated): Not indicated DVT prophylaxis: Subcutaneous Heparin GI prophylaxis: PPI Glucose control:  SSI Yes Central venous access:  N/A Arterial line:  N/A Foley:  N/A Mobility:  bed rest  PT consulted: N/A Last date of multidisciplinary goals of care discussion [11/6] Code Status:  DNR Disposition: Stepdown   = Goals of Care = Code Status Order: DNR  Primary Emergency Contact: Murdock,[name] Patient wishes to pursue ongoing treatment, but concurred that if deteriorated to pulselessness, patient would prefer a natural death as opposed  to invasive measures such as CPR and intubation.  Family should be immediately contacted in such situations.  Critical care provider statement:   Total critical care time: 33 minutes   Performed by: Lanney Gins MD   Critical care time was exclusive of separately billable procedures and treating other patients.   Critical care was necessary to treat or prevent imminent or life-threatening deterioration.   Critical care was time spent personally by me on the following activities: development of treatment plan with patient and/or surrogate as well as nursing, discussions with consultants, evaluation of patient's response to treatment, examination of patient, obtaining history from patient or surrogate, ordering and performing treatments and interventions, ordering and review of laboratory studies, ordering and review of radiographic studies, pulse oximetry and re-evaluation of patient's condition.    Ottie Glazier, M.D.  Pulmonary & Critical Care Medicine

## 2022-10-08 NOTE — Progress Notes (Signed)
This patient remains in the ICU with multiple medical issues.  The patient's consult for sepsis has not presented itself to be from a GI luminal issue.  Of concern is the continued thought of possible gallbladder disease.  A HIDA scan may be considered when the patient is more stable and a surgical consult versus interventional radiology for a cholecystectomy versus a cholecystostomy.  Either way there is nothing further to do from a GI point of view.  I will sign off.  Please call if any further GI concerns or questions.  We would like to thank you for the opportunity to participate in the care of Chelsea Ramsey.

## 2022-10-08 NOTE — Evaluation (Signed)
Occupational Therapy Evaluation Patient Details Name: Chelsea Ramsey MRN: [id number] DOB: [birth year] Today's Date: 10/08/2022   History of Present Illness [name] is a 56 year old female with history of morbid obesity, history of drug use including cocaine and THC, history of EtOH abuse, neuropathy, depression, atrial fibrillation on Xarelto, hyperlipidemia, hypertension, who presents emergency department for chief concerns of shortness of breath.   Clinical Impression   Patient presenting with decreased independence in self-care, functional mobility, balance, safety awareness, strength, and endurance. Patient recently discharged from hospital 11/2 readmitted 11/5, living at home with husband at [facility] I level. Patient currently functioning at max A +2 for bed mobility and transfers. ~10 minute standing tolerance to perform peri-care, total A. Patient was able to progress to taking side steps to [facility]. During session RR was at 45 and HR increased to 130's. Rectal tube leaking. Nurse present during session. Patient left in supine with nurse present. Patient will benefit from acute OT to increase overall independence in the areas of ADLs, functional mobility, in order to safely discharge to the next venue of care.       Recommendations for follow up therapy are one component of a multi-disciplinary discharge planning process, led by the attending physician.  Recommendations may be updated based on patient status, additional functional criteria and insurance authorization.   Follow Up Recommendations  Skilled nursing-short term rehab (<3 hours/day)    Assistance Recommended at Discharge Frequent or constant Supervision/Assistance  Patient can return home with the following A lot of help with walking and/or transfers;A lot of help with bathing/dressing/bathroom;Help with stairs or ramp for entrance;Assist for transportation;Direct supervision/assist for medications management;Direct  supervision/assist for financial management;Assistance with cooking/housework    Functional Status Assessment  Patient has had a recent decline in their functional status and demonstrates the ability to make significant improvements in function in a reasonable and predictable amount of time.  Equipment Recommendations  Other (comment) (Defer to next venur of care)       Precautions / Restrictions Precautions Precautions: Fall;Other (comment) Precaution Comments: Rectal tube, foley Restrictions Weight Bearing Restrictions: No      Mobility Bed Mobility Overal bed mobility: Needs Assistance Bed Mobility: Supine to Sit, Sit to Supine     Supine to sit: Max assist, +2 for physical assistance, +2 for safety/equipment Sit to supine: Max assist, +2 for physical assistance, +2 for safety/equipment        Transfers Overall transfer level: Needs assistance Equipment used: Rolling walker (2 wheels)   Sit to Stand: +2 physical assistance, Max assist, From elevated surface                  Balance Overall balance assessment: Needs assistance Sitting-balance support: Feet supported Sitting balance-Leahy Scale: Fair Sitting balance - Comments: once feet on the floor, patient required only Min guard assistance   Standing balance support: Bilateral upper extremity supported, Reliant on assistive device for balance Standing balance-Leahy Scale: Fair Standing balance comment: standing tolerance of around 5 minutes today                           ADL either performed or assessed with clinical judgement   ADL Overall ADL's : Needs assistance/impaired                             Toileting- Clothing Manipulation and Hygiene: Total assistance  Vision Patient Visual Report: No change from baseline              Pertinent Vitals/Pain Pain Assessment Pain Assessment: Faces Faces Pain Scale: Hurts even more Pain Location:  [place] Pain Descriptors / Indicators: Discomfort Pain Intervention(s): Limited activity within patient's tolerance, Repositioned        Extremity/Trunk Assessment Upper Extremity Assessment Upper Extremity Assessment: Generalized weakness   Lower Extremity Assessment Lower Extremity Assessment: Generalized weakness          Cognition Arousal/Alertness: Awake/alert Behavior During Therapy: WFL for tasks assessed/performed Overall Cognitive Status: No family/caregiver present to determine baseline cognitive functioning                                 General Comments: increased time but good insight                Home Living Family/patient expects to be discharged to:: Private residence Living Arrangements: Spouse/significant other Available Help at Discharge: Available PRN/intermittently Type of Home: House Home Access: Stairs to enter [employer] of Steps: 2 Entrance Stairs-Rails: None Home Layout: One level     Bathroom Shower/Tub: Teacher, early years/pre: Standard     Home Equipment: Conservation officer, nature (2 wheels);Cane - single point          Prior Functioning/Environment Prior Level of Function : Needs assist             Mobility Comments: Pt reports that until the last few weeks she was either using no AD or just a SPC, up ad lib cooking and doing light house work ADLs Comments: Pt reports being mod I with self care tasks and some IADLs. Family assist with IADLs as well.        OT Problem List: Decreased strength;Decreased activity tolerance;Impaired balance (sitting and/or standing);Decreased safety awareness      OT Treatment/Interventions: Self-care/ADL training;Therapeutic exercise;Energy conservation;DME and/or AE instruction;Therapeutic activities;Balance training;Patient/family education    OT Goals(Current goals can be found in the care plan section) Acute Rehab OT Goals Patient Stated Goal: get  better OT Goal Formulation: With patient Time For Goal Achievement: 10/22/22 Potential to Achieve Goals: Fair ADL Goals Pt Will Perform Lower Body Bathing: with supervision Pt Will Perform Lower Body Dressing: with supervision Pt Will Transfer to Toilet: with supervision Pt Will Perform Toileting - Clothing Manipulation and hygiene: sit to/from stand;with supervision Pt/caregiver will Perform Home Exercise Program: Increased strength;Both right and left upper extremity;With theraband;With Supervision  OT Frequency: Min 2X/week       AM-PAC OT "6 Clicks" Daily Activity     Outcome Measure Help from another person eating meals?: None Help from another person taking care of personal grooming?: A Little Help from another person toileting, which includes using toliet, bedpan, or urinal?: A Lot Help from another person bathing (including washing, rinsing, drying)?: A Lot Help from another person to put on and taking off regular upper body clothing?: A Little Help from another person to put on and taking off regular lower body clothing?: A Lot 6 Click Score: 16   End of Session Equipment Utilized During Treatment: Oxygen Nurse Communication: Mobility status;Other (comment) (rectal tube leaking)  Activity Tolerance: Patient tolerated treatment well Patient left: in bed;with call bell/phone within reach;with nursing/sitter in room  OT Visit Diagnosis: Unsteadiness on feet (R26.81);Repeated falls (R29.6);Muscle weakness (generalized) (M62.81)  Time: 7408-1448 OT Time Calculation (min): 20 min Charges:       Tomasa Blase, OTS 10/08/2022, 2:11 PM

## 2022-10-08 NOTE — Progress Notes (Signed)
   10/08/22 0800  Clinical Encounter Type  Visited With Patient  Visit Type Initial  Referral From Palliative care team  Consult/Referral To Chaplain   Chaplain responded to consult requested by palliative care for advance directive completion. Chaplain met with patient. Chaplain explained AD paperwork and process. Patient was unclear after multiple questions who she wanted as her HCPOA. Patient was also not clear in her communication in general. Chaplain left AD paperwork and shared Chaplains are available when she decides she would like to complete it or for any other emotional/spiritual support needs.

## 2022-10-09 LAB — COMPREHENSIVE METABOLIC PANEL
ALT: 44 U/L (ref 0–44)
AST: 75 U/L — ABNORMAL HIGH (ref 15–41)
Albumin: 2.6 g/dL — ABNORMAL LOW (ref 3.5–5.0)
Alkaline Phosphatase: 86 U/L (ref 38–126)
Anion gap: 11 (ref 5–15)
BUN: 49 mg/dL — ABNORMAL HIGH (ref 6–20)
CO2: 29 mmol/L (ref 22–32)
Calcium: 8.8 mg/dL — ABNORMAL LOW (ref 8.9–10.3)
Chloride: 99 mmol/L (ref 98–111)
Creatinine, Ser: 2.48 mg/dL — ABNORMAL HIGH (ref 0.44–1.00)
GFR, Estimated: 22 mL/min — ABNORMAL LOW (ref >60)
Glucose, Bld: 124 mg/dL — ABNORMAL HIGH (ref 70–99)
Potassium: 3.8 mmol/L (ref 3.5–5.1)
Sodium: 139 mmol/L (ref 135–145)
Total Bilirubin: 8.2 mg/dL — ABNORMAL HIGH (ref 0.3–1.2)
Total Protein: 6.9 g/dL (ref 6.5–8.1)

## 2022-10-09 LAB — CBC
HCT: 29.4 % — ABNORMAL LOW (ref 36.0–46.0)
Hemoglobin: 8.6 g/dL — ABNORMAL LOW (ref 12.0–15.0)
MCH: 25.1 pg — ABNORMAL LOW (ref 26.0–34.0)
MCHC: 29.3 g/dL — ABNORMAL LOW (ref 30.0–36.0)
MCV: 86 fL (ref 80.0–100.0)
Platelets: 311 10*3/uL (ref 150–400)
RBC: 3.42 MIL/uL — ABNORMAL LOW (ref 3.87–5.11)
RDW: 26.2 % — ABNORMAL HIGH (ref 11.5–15.5)
WBC: 15.4 10*3/uL — ABNORMAL HIGH (ref 4.0–10.5)
nRBC: 0.8 % — ABNORMAL HIGH (ref 0.0–0.2)

## 2022-10-09 LAB — GLUCOSE, CAPILLARY
Glucose-Capillary: 141 mg/dL — ABNORMAL HIGH (ref 70–99)
Glucose-Capillary: 128 mg/dL — ABNORMAL HIGH (ref 70–99)
Glucose-Capillary: 114 mg/dL — ABNORMAL HIGH (ref 70–99)

## 2022-10-09 LAB — MAGNESIUM: Magnesium: 2.2 mg/dL (ref 1.7–2.4)

## 2022-10-09 LAB — CULTURE, BLOOD (ROUTINE X 2)
Culture: NO GROWTH
Culture: NO GROWTH
Special Requests: ADEQUATE

## 2022-10-09 LAB — HEPARIN LEVEL (UNFRACTIONATED): Heparin Unfractionated: 0.17 IU/mL — ABNORMAL LOW (ref 0.30–0.70)

## 2022-10-09 LAB — PHOSPHORUS: Phosphorus: 2.9 mg/dL (ref 2.5–4.6)

## 2022-10-09 MED ORDER — ACETAMINOPHEN 650 MG RE SUPP: 650.0000 mg | Freq: Four times a day (QID) | RECTAL | Status: DC | PRN

## 2022-10-09 MED ORDER — GLYCOPYRROLATE 1 MG PO TABS: 1.0000 mg | ORAL_TABLET | ORAL | Status: DC | PRN

## 2022-10-09 MED ORDER — POLYVINYL ALCOHOL 1.4 % OP SOLN: 1.0000 [drp] | Freq: Four times a day (QID) | OPHTHALMIC | Status: DC | PRN

## 2022-10-09 MED ORDER — ACETAMINOPHEN 325 MG PO TABS: 650.0000 mg | ORAL_TABLET | Freq: Four times a day (QID) | ORAL | Status: DC | PRN

## 2022-10-09 MED ORDER — SODIUM CHLORIDE 0.9 % IV SOLN: INTRAVENOUS | Status: DC

## 2022-10-09 MED ORDER — MORPHINE 100MG IN NS 100ML (1MG/ML) PREMIX INFUSION: 1.0000 mg/h | INTRAVENOUS | Status: DC

## 2022-10-09 MED ORDER — GLYCOPYRROLATE 0.2 MG/ML IJ SOLN: 0.2000 mg | INTRAMUSCULAR | Status: DC | PRN

## 2022-10-09 MED ORDER — HEPARIN BOLUS VIA INFUSION
2400.0000 [IU] | Freq: Once | INTRAVENOUS | Status: AC
  Administered 2022-10-09: 2400 [IU] via INTRAVENOUS
  Filled 2022-10-09: qty 2400

## 2022-10-09 MED ORDER — MORPHINE BOLUS VIA INFUSION: 5.0000 mg | INTRAVENOUS | Status: DC | PRN

## 2022-10-09 MED ORDER — MORPHINE 100MG IN NS 100ML (1MG/ML) PREMIX INFUSION
0.0000 mg/h | INTRAVENOUS | Status: DC
  Administered 2022-10-09: 10 mg/h via INTRAVENOUS
  Administered 2022-10-09: 5 mg/h via INTRAVENOUS
  Administered 2022-10-10: 10 mg/h via INTRAVENOUS
  Filled 2022-10-09 (×3): qty 100

## 2022-10-09 MED ORDER — GLYCOPYRROLATE 0.2 MG/ML IJ SOLN
0.2000 mg | INTRAMUSCULAR | Status: DC | PRN
  Administered 2022-10-10: 0.2 mg via INTRAVENOUS
  Filled 2022-10-09: qty 1

## 2022-10-09 NOTE — Progress Notes (Signed)
OT Cancellation Note  Patient Details Name: Chelsea Ramsey MRN: [id number] DOB: 1965-12-08   Cancelled Treatment:    Reason Eval/Treat Not Completed: Other (comment). Per chart review, pt now on comfort care measures.  OT to complete orders at this time.   Darleen Crocker, MS, OTR/L , CBIS ascom [phone]  10/09/22, 10:16 AM

## 2022-10-09 NOTE — Consult Note (Signed)
ANTICOAGULATION CONSULT NOTE  Pharmacy Consult for heparin gtt Indication: atrial fibrillation  Patient Measurements: Height: '5\' 4"'$  (162.6 cm) Weight: 113.1 kg (249 lb 5.4 oz) IBW/kg (Calculated) : 54.7 Heparin Dosing Weight: 79.8 kg  Labs: Recent Labs    10/06/22 0215 10/06/22 0418 10/06/22 2050 10/07/22 0547 10/07/22 1757 10/08/22 0450 10/08/22 1557 10/09/22 0013  HGB 9.2*  --   --  8.8*  --  8.4*  --   --   HCT 32.4*  --   --  30.3*  --  29.2*  --   --   PLT 307  --   --  309  --  337  --   --   APTT 87*   < > 74* 159* 129* 140* 69*  --   LABPROT 20.8*  --   --   --   --   --   --   --   INR 1.8*  --   --   --   --   --   --   --   HEPARINUNFRC 0.27*   < > 0.14* 0.48 0.35 0.30 0.10* 0.17*  CREATININE 3.59*  --  3.29* 3.03*  --  2.73*  --   --    < > = values in this interval not displayed.    Estimated Creatinine Clearance: 28.7 mL/min (A) (by C-G formula based on SCr of 2.73 mg/dL (H)).  Medications:  Heparin Dosing Weight: 79.8 kg PTA: Xarelto 20 QHS (last dose on 11/03 PTA) Inpatient: [facility] 5k Q8h  >> heparin gtt   Assessment: 56yo F w/ h/o drug and alcohol abuse, HTN, HLD, MDD and AFib, who was admitted to [facility] on 10/17/2022 for severe sepsis with acute organ dysfunction. Pharmacy consulted to transition of prophylactic heparin to infusion. Pt xarelto for Afib PTA is on hold ISO severe AKI until renal fxn normalizes.  Date    Time    aPTT/HL         Rate/Comment 11/7     0215    87  / 0.27         Thera; 1250u/hr 11/7     1219    112 / 0.32        Thera; 1250u/hr 11/7     2050    74  / 0.14         Subth; 1250u/hr 11/7     0547    159 / 0.48        Thera; 1450u/hr 11/9 1557 69 / 0.1 Subth;  1100 u/hr 11/10   0013       0.17              SUBtherapeutic @ 1350 units/hr   Goal of Therapy:  Heparin level 0.3-0.7 units/ml Monitor platelets by anticoagulation protocol: Yes   Plan:  11/10:  HL @ 0013 = 0.17, SUBtherapeutic  Will order heparin 2400 units IV X 1  bolus and increase drip rate to 1650 units/hr. Will recheck HL 8 hrs after rate change.   Harjas Biggins D 10/09/2022 1:11 AM

## 2022-10-09 NOTE — Plan of Care (Signed)
Continuing with plan of care. 

## 2022-10-09 NOTE — Progress Notes (Signed)
At 1000 patient was transitioned to comfort care.

## 2022-10-09 NOTE — IPAL (Signed)
GOALS OF CARE FAMILY CONFERENCE   Current clinical status, hospital findings and medical plan was reviewed with family.   Updated and notified of patients ongoing immediate critical medical problems.   Patient remains with altered mental status   Patient with multiorgan failure despite maximal medical management  Explained to family course of therapy and the modalities   Patient with Progressive multiorgan failure with high probability of a very minimal chance of meaningful recovery despite aggressive and optimal medical therapy.   Family is appreciative of care and relate understanding that patient is severely critically ill with anticipation of passing away during this hospitalization.   They have consented and agreed to DNR/DNI  COMFORT CARE Code status AS PER MOTHER    Family are satisfied with Plan of action and management. All questions answered  Additional Critical Care time 35 mins    Ottie Glazier, M.D.  Pulmonary & [place]

## 2022-10-09 NOTE — Progress Notes (Signed)
Patient arrived to unit from ICU in bed with no s/s of distress.  Morphine gtt at 22m/hr, foley and flexicele intact, patient with R TL IJ.  No family present.  Will continue with current plan of care.

## 2022-10-09 NOTE — Significant Event (Signed)
Palliative care and critical care progress notes reviewed from 11/9.  Discussed with Dr. Lanney Gins earlier this morning, the patient and family decided on comfort care only today, 11/10.  No further recommendations from a cardiac standpoint, we will sign off and be available if needed.  Chelsea Schroeder, PA-C

## 2022-10-09 NOTE — Progress Notes (Signed)
PT Cancellation Note  Patient Details Name: Loris Seelye MRN: [id number] DOB: 1966/06/04   Cancelled Treatment:    Reason Eval/Treat Not Completed: Patient not medically ready (Patient consult received and reviewed. Per physician secure chat will sign off on patient due to patient medical status.)   Janna Arch, PT, DPT  10/09/2022, 9:06 AM

## 2022-10-09 NOTE — Progress Notes (Signed)
Report given to receiving nurse for room 124, patient transported on bed to unit.

## 2022-10-09 NOTE — Progress Notes (Signed)
[facility]  ROUNDING NOTE   Subjective:   Renal function appears to be slightly improved with creatinine down to 2.48. Unclear if urine output was accurate as it was recorded as being low at 330. Still has relative hypotension with blood pressure 99/75 this AM.   Objective:  Vital signs in last 24 hours:  Temp:  [97.8 F (36.6 C)-98.5 F (36.9 C)] 98.1 F (36.7 C) (11/10 0400) Pulse Rate:  [114-135] 114 (11/10 0615) Resp:  [16-31] 23 (11/10 0615) BP: (89-100)/(69-80) 89/75 (11/10 0400) SpO2:  [90 %-98 %] 91 % (11/10 0615) Arterial Line BP: (81-114)/(52-80) 96/65 (11/10 0700)  Weight change:  Filed Weights   10/05/22 1700 10/07/22 0456 10/08/22 0500  Weight: 106.3 kg 105.9 kg 113.1 kg    Intake/Output: I/O last 3 completed shifts: In: 1774.5 [P.O.:240; I.V.:1480.8; Other:50; IV Piggyback:3.7] Out: 1330 [Urine:1130; Stool:200]   Intake/Output this shift:  No intake/output data recorded.  Physical Exam: General: NAD  Head: Normocephalic, atraumatic. Moist oral mucosal membranes  Eyes: Anicteric  Lungs:  Scattered rhonchi  Heart: Irregular rate and rhythm  Abdomen:  Soft, nontender  Extremities:  1+ peripheral edema.  Neurologic: Lethargic but arousable  Skin: No lesions  Access: None    Basic Metabolic Panel: Recent Labs  Lab 10/02/2022 1602 10/05/22 0306 10/06/22 0215 10/06/22 2050 10/07/22 0547 10/08/22 0450 10/09/22 0427  NA 137   < > 136 139 139 141 139  K 5.5*   < > 4.6 4.1 3.8 3.3* 3.8  CL 90*   < > 95* 97* 97* 99 99  CO2 34*   < > '30 29 30 30 29  '$ GLUCOSE 64*   < > 101* 105* 98 124* 124*  BUN 50*   < > 51* 53* 48* 48* 49*  CREATININE 3.83*   < > 3.59* 3.29* 3.03* 2.73* 2.48*  CALCIUM 9.3   < > 8.5* 8.8* 8.7* 9.0 8.8*  MG 2.2  --  1.9  --  1.7 2.3 2.2  PHOS  --   --  5.5*  --  4.6 3.8 2.9   < > = values in this interval not displayed.     Liver Function Tests: Recent Labs  Lab 10/05/22 0306 10/06/22 0215 10/07/22 0547  10/08/22 0450 10/09/22 0427  AST 73* 70* 60* 65* 75*  ALT 40 40 39 39 44  ALKPHOS 91 91 81 82 86  BILITOT 9.4* 9.4* 8.6* 8.5* 8.2*  PROT 7.1 7.0 6.8 6.7 6.9  ALBUMIN 2.8* 2.7* 2.6* 2.6* 2.6*    No results for input(s): "LIPASE", "AMYLASE" in the last 168 hours. Recent Labs  Lab 10/05/22 0306  AMMONIA 67*     CBC: Recent Labs  Lab 10/29/2022 1602 10/05/22 0306 10/06/22 0215 10/07/22 0547 10/08/22 0450 10/09/22 0427  WBC 12.0* 12.7* 13.5* 12.8* 12.1* 15.4*  NEUTROABS 9.4*  --   --   --   --   --   HGB 8.6* 9.0* 9.2* 8.8* 8.4* 8.6*  HCT 30.7* 32.5* 32.4* 30.3* 29.2* 29.4*  MCV 88.5 88.8 86.9 86.6 86.4 86.0  PLT 258 290 307 309 337 311     Cardiac Enzymes: No results for input(s): "CKTOTAL", "CKMB", "CKMBINDEX", "TROPONINI" in the last 168 hours.  BNP: Invalid input(s): "POCBNP"  CBG: Recent Labs  Lab 10/08/22 1632 10/08/22 1933 10/08/22 2321 10/09/22 0402 10/09/22 0741  GLUCAP 163* 137* 117* 141* 128*     Microbiology: Results for orders placed or performed during the hospital encounter of 10/14/2022  Resp Panel by RT-PCR (Flu A&B, Covid) Anterior Nasal Swab     Status: None   Collection Time: 10/11/2022  3:44 PM   Specimen: Anterior Nasal Swab  Result Value Ref Range Status   SARS Coronavirus 2 by RT PCR NEGATIVE NEGATIVE Final    Comment: (NOTE) SARS-CoV-2 target nucleic acids are NOT DETECTED.  The SARS-CoV-2 RNA is generally detectable in upper respiratory specimens during the acute phase of infection. The lowest concentration of SARS-CoV-2 viral copies this assay can detect is 138 copies/mL. A negative result does not preclude SARS-Cov-2 infection and should not be used as the sole basis for treatment or other patient management decisions. A negative result may occur with  improper specimen collection/handling, submission of specimen other than nasopharyngeal swab, presence of viral mutation(s) within the areas targeted by this assay, and  inadequate number of viral copies(<138 copies/mL). A negative result must be combined with clinical observations, patient history, and epidemiological information. The expected result is Negative.  Fact Sheet for Patients:  EntrepreneurPulse.com.au  Fact Sheet for Healthcare Providers:  IncredibleEmployment.be  This test is no t yet approved or cleared by the [place] FDA and  has been authorized for detection and/or diagnosis of SARS-CoV-2 by FDA under an Emergency Use Authorization (EUA). This EUA will remain  in effect (meaning this test can be used) for the duration of the COVID-19 declaration under Section 564(b)(1) of the Act, 21 U.S.C.section 360bbb-3(b)(1), unless the authorization is terminated  or revoked sooner.       Influenza A by PCR NEGATIVE NEGATIVE Final   Influenza B by PCR NEGATIVE NEGATIVE Final    Comment: (NOTE) The Xpert Xpress SARS-CoV-2/FLU/RSV plus assay is intended as an aid in the diagnosis of influenza from Nasopharyngeal swab specimens and should not be used as a sole basis for treatment. Nasal washings and aspirates are unacceptable for Xpert Xpress SARS-CoV-2/FLU/RSV testing.  Fact Sheet for Patients: EntrepreneurPulse.com.au  Fact Sheet for Healthcare Providers: IncredibleEmployment.be  This test is not yet approved or cleared by the [place] FDA and has been authorized for detection and/or diagnosis of SARS-CoV-2 by FDA under an Emergency Use Authorization (EUA). This EUA will remain in effect (meaning this test can be used) for the duration of the COVID-19 declaration under Section 564(b)(1) of the Act, 21 U.S.C. section 360bbb-3(b)(1), unless the authorization is terminated or revoked.  Performed at [facility], [address], [name], [place] [address]   Urine Culture     Status: Abnormal   Collection Time: 10/07/2022  4:41 PM   Specimen:  Urine, Clean Catch  Result Value Ref Range Status   Specimen Description   Final    URINE, CLEAN CATCH Performed at [facility], [address], [place], [place] [address]    Special Requests   Final    NONE Performed at [facility], [place],  [address]    Culture (A)  Final    >=100,000 COLONIES/mL ESCHERICHIA COLI >=100,000 COLONIES/mL ENTEROCOCCUS FAECALIS    Report Status 10/07/2022 FINAL  Final   Organism ID, Bacteria ESCHERICHIA COLI (A)  Final   Organism ID, Bacteria ENTEROCOCCUS FAECALIS (A)  Final      Susceptibility   Escherichia coli - MIC*    AMPICILLIN <=2 SENSITIVE Sensitive     CEFAZOLIN <=4 SENSITIVE Sensitive     CEFEPIME <=0.12 SENSITIVE Sensitive     CEFTRIAXONE <=0.25 SENSITIVE Sensitive     CIPROFLOXACIN <=0.25 SENSITIVE Sensitive  GENTAMICIN <=1 SENSITIVE Sensitive     IMIPENEM <=0.25 SENSITIVE Sensitive     NITROFURANTOIN <=16 SENSITIVE Sensitive     TRIMETH/SULFA <=20 SENSITIVE Sensitive     AMPICILLIN/SULBACTAM <=2 SENSITIVE Sensitive     PIP/TAZO <=4 SENSITIVE Sensitive     * >=100,000 COLONIES/mL ESCHERICHIA COLI   Enterococcus faecalis - MIC*    AMPICILLIN <=2 SENSITIVE Sensitive     NITROFURANTOIN <=16 SENSITIVE Sensitive     VANCOMYCIN 1 SENSITIVE Sensitive     * >=100,000 COLONIES/mL ENTEROCOCCUS FAECALIS  Culture, blood (routine x 2)     Status: None   Collection Time: 10/07/2022  5:08 PM   Specimen: BLOOD LEFT HAND  Result Value Ref Range Status   Specimen Description BLOOD LEFT HAND  Final   Special Requests   Final    BOTTLES DRAWN AEROBIC ONLY Blood Culture results may not be optimal due to an inadequate volume of blood received in culture bottles   Culture   Final    NO GROWTH 5 DAYS Performed at [facility], [address] [place], [place] [address]    Report Status 10/09/2022 FINAL  Final  Culture, blood (routine x 2)     Status: None   Collection Time: 10/22/2022  5:42 PM    Specimen: BLOOD LEFT HAND  Result Value Ref Range Status   Specimen Description BLOOD LEFT HAND  Final   Special Requests   Final    BOTTLES DRAWN AEROBIC AND ANAEROBIC Blood Culture adequate volume   Culture   Final    NO GROWTH 5 DAYS Performed at [facility], [address] [place], [place] [address]    Report Status 10/09/2022 FINAL  Final  MRSA Next Gen by PCR, Nasal     Status: None   Collection Time: 10/05/22  9:12 AM   Specimen: Nasal Mucosa; Nasal Swab  Result Value Ref Range Status   MRSA by PCR Next Gen NOT DETECTED NOT DETECTED Final    Comment: (NOTE) The GeneXpert MRSA Assay (FDA approved for NASAL specimens only), is one component of a comprehensive MRSA colonization surveillance program. It is not intended to diagnose MRSA infection nor to guide or monitor treatment for MRSA infections. Test performance is not FDA approved in patients less than 43 years old. Performed at [facility], [address] [place], [place] [address]     Coagulation Studies: No results for input(s): "LABPROT", "INR" in the last 72 hours.   Urinalysis: No results for input(s): "COLORURINE", "LABSPEC", "PHURINE", "GLUCOSEU", "HGBUR", "BILIRUBINUR", "KETONESUR", "PROTEINUR", "UROBILINOGEN", "NITRITE", "LEUKOCYTESUR" in the last 72 hours.  Invalid input(s): "APPERANCEUR"     Imaging: No results found.   Medications:    sodium chloride     sodium chloride     amiodarone 30 mg/hr (10/09/22 0700)   furosemide (LASIX) 200 mg in dextrose 5 % 100 mL (2 mg/mL) infusion 4 mg/hr (10/09/22 0700)   heparin 1,650 Units/hr (10/09/22 0700)   morphine     norepinephrine (LEVOPHED) Adult infusion 9 mcg/min (10/09/22 0700)   vasopressin 0.03 Units/min (10/09/22 0700)    Chlorhexidine Gluconate Cloth  6 each Topical Daily   feeding supplement  237 mL Oral TID BM   lactulose  10 g Oral BID   multivitamin with minerals  1 tablet Oral Daily   pantoprazole  80 mg Oral  Daily   rifaximin  550 mg Oral BID   rosuvastatin  10 mg Oral Daily   acetaminophen **OR** acetaminophen, diazepam, docusate  sodium, fentaNYL (SUBLIMAZE) injection, glycopyrrolate **OR** glycopyrrolate **OR** glycopyrrolate, melatonin, metoprolol tartrate, morphine, nitroGLYCERIN, oxyCODONE, polyvinyl alcohol, senna-docusate  Assessment/ Plan:  [name] is a 56 y.o.  female with past medical conditions including drug and alcohol abuse, hypertension, hyperlipidemia, depression and atrial fib, who was admitted to [facility] on 10/05/2022 for Generalized weakness [R53.1] AKI (acute kidney injury) ([place]) [N17.9] Severe sepsis with acute organ dysfunction ([place]) [A41.9, R65.20] Acute on chronic respiratory failure with hypoxia ([facility]) [J96.21] Sepsis, due to unspecified organism, unspecified whether acute organ dysfunction present ([place]) [A41.9]   Acute kidney injury with hyperkalemia likely secondary to cardiorenal syndrome and possible UTI, urine culture pending. Normal renal function in July 2023. Creatinine on admission , 3.83.   Creatinine slightly lower today at 2.48.  Unclear as to whether urine output yesterday was accurate as only 330 cc was recorded and the day before urine output was 1.3 L.  No immediate need for dialysis.  Continue to trend renal function and avoid nephrotoxins as possible.  Relative hypotension may be contributing to acute kidney injury as well.  Lab Results  Component Value Date   CREATININE 2.48 (H) 10/09/2022   CREATININE 2.73 (H) 10/08/2022   CREATININE 3.03 (H) 10/07/2022    Intake/Output Summary (Last 24 hours) at 10/09/2022 1045 Last data filed at 10/09/2022 0700 Gross per 24 hour  Intake 1390.43 ml  Output 330 ml  Net 1060.43 ml    2. Anemia, likely due to kidney injury.  Hemoglobin 8.6.  No immediate need for blood transfusion.   3. Hypotension likely secondary to sepsis.remains on vasopressin and norepinephrine.  4. Chronic systolic  heart failure. ECHO on 09/23/22 shows EF 35-40%, right ventricular enlarged with severe tricuspid regurgitation and aortic calcification.  Maintain the patient on Lasix drip for now.   LOS: 5 Brittanee Ghazarian 11/10/202310:45 AM

## 2022-10-09 NOTE — Progress Notes (Signed)
NAME:  Chelsea Ramsey, MRN:  [id number], DOB:  10/15/1966, LOS: 5 ADMISSION DATE:  10/01/2022, CONSULTATION DATE:  10/05/2022 REFERRING MD:  Neomia Glass NP  REASON FOR CONSULT:  Hypotension   HPI  56 y.o female with significant PMH of drug-induced gout of left foot (03/01/2018), Anxiety, Arthritis, Asthma,COPD, Coronary artery disease, HFrEF (current EF 35-40%) DM, Fibromyalgia, GERD, Hypertension, Pulmonary HTN,  Rheumatic fever/heart disease, paroxysmal atrial fibrillation on Xarelto, COPD w/ chronic respiratory failure on baseline 3 L, ongoing tobacco use, TIA/CVA, and Sleep apnea. who presented to the ED with chief complaints of generalized weakness shortness of breath and itching.  On review of her chart, patient was recently discharged on 10/17 following admission for acute on chronic CHF exacerbation, transaminitis, AKI and altered mental status.  Per ED reports, since discharge patient has been progressively weak and not able to walk or take her medication as prescribed. Per EMS report, patient fell from her wheelchair but denied hitting her head or LOC.    10/08/22-patient continues to have arrythmia with anasarca, requires multiple vasopressors. Starting amiodarone as per cardiology.   10/09/22- patient continues to deteriorate despite maximal medical management with multiorgan failure. Family meeting today.    Past Medical History  drug-induced gout of left foot (03/01/2018), Anxiety, Arthritis, Asthma,COPD, Coronary artery disease, HFrEF (current EF 35-40%) DM, Fibromyalgia, GERD, Hypertension, Pulmonary HTN,  Rheumatic fever/heart disease, paroxysmal atrial fibrillation on Xarelto, COPD w/ chronic respiratory failure on baseline 3 L, ongoing tobacco use, TIA/CVA, and Sleep apnea  Significant Hospital Events   11/5: Admit to hospitalist service with sepsis of unknown origin and AMS. 11/6: Remained hypotensive despite IVF and started on pressors. PCCM consulted 10/06/22-  patient confused with encephalopathy, rectal tube with loose stools 10/07/22- mentation has improved, blood work improved with improved renal function and improved transaminitis, GI and Surgery have seen patient and feel gall bladder can be addressed on outpatient.   Consults:  PCCM Cardiology  Procedures:  None  Significant Diagnostic Tests:  11/5: Chest Xray> 11/5: Noncontrast CT head>  Micro Data:  11/5: SARS-CoV-2 PCR> negative 11/5: Influenza PCR> negative 11/5: Blood culture x2> 11/5: Urine Culture> 11/5: MRSA PCR>>   Antimicrobials:  Vancomycin 11/5>> Cefepime 11/5>>  OBJECTIVE  Blood pressure (!) 89/75, pulse (!) 114, temperature 98.1 F (36.7 C), temperature source Oral, resp. rate (!) 23, height '5\' 4"'$  (1.626 m), weight 113.1 kg, last menstrual period 11/08/2009, SpO2 91 %.        Intake/Output Summary (Last 24 hours) at 10/09/2022 1005 Last data filed at 10/09/2022 0700 Gross per 24 hour  Intake 1390.43 ml  Output 330 ml  Net 1060.43 ml    Filed Weights   10/05/22 1700 10/07/22 0456 10/08/22 0500  Weight: 106.3 kg 105.9 kg 113.1 kg    Physical Examination  GENERAL: 56 year-old critically ill patient lying in the bed with no acute distress.  EYES: Pupils equal, round, reactive to light and accommodation. No scleral icterus. Extraocular muscles intact.  HEENT: Head atraumatic, normocephalic. Oropharynx and nasopharynx clear.  NECK:  Supple, no jugular venous distention. No thyroid enlargement, no tenderness.  LUNGS: Normal breath sounds bilaterally, no wheezing, rales,rhonchi or crepitation. No use of accessory muscles of respiration.  CARDIOVASCULAR: S1, S2 normal. No murmurs, rubs, or gallops.  ABDOMEN: Soft, nontender, nondistended. Bowel sounds present. No organomegaly or mass.  EXTREMITIES: Upper and lower extremities are atraumatic in appearance without tenderness or deformity. Mild trace edema of bilateral lower extremities. Unable to assess muscle  strength i. Capillary refill is less than 3 seconds in all extremities. Pulses palpable.  NEUROLOGIC:The patient is  lethargic but arouses easily  with somewhat slurred speech. Moves all extremities. Sensation is intact bilaterally. Reflexes 2+ bilaterally.  intact. Gait not checked.  PSYCHIATRIC: The patient is drowsy, oriented to self only SKIN: No obvious rash, lesion, or ulcer.   Labs/imaging that I havepersonally reviewed  (right click and "Reselect all SmartList Selections" daily)     Labs   CBC: Recent Labs  Lab 10/08/2022 1602 10/05/22 0306 10/06/22 0215 10/07/22 0547 10/08/22 0450 10/09/22 0427  WBC 12.0* 12.7* 13.5* 12.8* 12.1* 15.4*  NEUTROABS 9.4*  --   --   --   --   --   HGB 8.6* 9.0* 9.2* 8.8* 8.4* 8.6*  HCT 30.7* 32.5* 32.4* 30.3* 29.2* 29.4*  MCV 88.5 88.8 86.9 86.6 86.4 86.0  PLT 258 290 307 309 337 311     Basic Metabolic Panel: Recent Labs  Lab 10/28/2022 1602 10/05/22 0306 10/06/22 0215 10/06/22 2050 10/07/22 0547 10/08/22 0450 10/09/22 0427  NA 137   < > 136 139 139 141 139  K 5.5*   < > 4.6 4.1 3.8 3.3* 3.8  CL 90*   < > 95* 97* 97* 99 99  CO2 34*   < > '30 29 30 30 29  '$ GLUCOSE 64*   < > 101* 105* 98 124* 124*  BUN 50*   < > 51* 53* 48* 48* 49*  CREATININE 3.83*   < > 3.59* 3.29* 3.03* 2.73* 2.48*  CALCIUM 9.3   < > 8.5* 8.8* 8.7* 9.0 8.8*  MG 2.2  --  1.9  --  1.7 2.3 2.2  PHOS  --   --  5.5*  --  4.6 3.8 2.9   < > = values in this interval not displayed.    GFR: Estimated Creatinine Clearance: 31.6 mL/min (A) (by C-G formula based on SCr of 2.48 mg/dL (H)). Recent Labs  Lab 10/16/2022 1707 10/05/22 0306 10/05/22 1357 10/06/22 0215 10/07/22 0547 10/08/22 0450 10/09/22 0427  PROCALCITON 0.69 0.53  --  0.57 0.42  --   --   WBC  --  12.7*  --  13.5* 12.8* 12.1* 15.4*  LATICACIDVEN 2.1* 1.8 1.3  --   --   --   --      Liver Function Tests: Recent Labs  Lab 10/05/22 0306 10/06/22 0215 10/07/22 0547 10/08/22 0450 10/09/22 0427   AST 73* 70* 60* 65* 75*  ALT 40 40 39 39 44  ALKPHOS 91 91 81 82 86  BILITOT 9.4* 9.4* 8.6* 8.5* 8.2*  PROT 7.1 7.0 6.8 6.7 6.9  ALBUMIN 2.8* 2.7* 2.6* 2.6* 2.6*    No results for input(s): "LIPASE", "AMYLASE" in the last 168 hours. Recent Labs  Lab 10/05/22 0306  AMMONIA 67*     ABG    Component Value Date/Time   PHART 7.39 10/05/2022 1401   PCO2ART 54 (H) 10/05/2022 1401   PO2ART 76 (L) 10/05/2022 1401   HCO3 32.7 (H) 10/05/2022 1401   ACIDBASEDEF 0.2 12/20/2017 2033   O2SAT 64.5 10/06/2022 0418     Coagulation Profile: Recent Labs  Lab 10/05/22 1419 10/06/22 0215  INR 1.5* 1.8*     Cardiac Enzymes: No results for input(s): "CKTOTAL", "CKMB", "CKMBINDEX", "TROPONINI" in the last 168 hours.  HbA1C: Hemoglobin A1C  Date/Time Value Ref Range Status  03/08/2020 03:52 PM 6.7 (A) 4.0 - 5.6 % Final  08/21/2014 12:00 AM  5.2  Final  06/03/2012 04:52 AM 4.5 4.2 - 6.3 % Final    Comment:    The American Diabetes Association recommends that a primary goal of therapy should be <7% and that physicians should reevaluate the treatment regimen in patients with HbA1c values consistently >8%.    Hgb A1c MFr Bld  Date/Time Value Ref Range Status  09/15/2022 12:59 PM 4.5 (L) 4.8 - 5.6 % Final    Comment:    (NOTE) Pre diabetes:          5.7%-6.4%  Diabetes:              >6.4%  Glycemic control for   <7.0% adults with diabetes   08/24/2019 04:54 PM 6.2 (H) 4.8 - 5.6 % Final    Comment:    (NOTE)         Prediabetes: 5.7 - 6.4         Diabetes: >6.4         Glycemic control for adults with diabetes: <7.0     CBG: Recent Labs  Lab 10/08/22 1632 10/08/22 1933 10/08/22 2321 10/09/22 0402 10/09/22 0741  GLUCAP 163* 137* 117* 141* 128*     Review of Systems:   UNABLE TO OBTAIN PATIENT IS ALTERED  Past Medical History  She,  has a past medical history of Acute drug-induced gout of left foot (03/01/2018), Allergy, Anxiety, Arthritis, Asthma, CHF  (congestive heart failure) ([place]), COPD (chronic obstructive pulmonary disease) ([place]), Coronary artery disease, Diabetes mellitus without complication ([place]), Dysrhythmia, Fibromyalgia, GERD (gastroesophageal reflux disease), Hypertension, Pneumonia, PUD (peptic ulcer disease), Pulmonary HTN ([place]), Rheumatic fever/heart disease, and Sleep apnea.   Surgical History    Past Surgical History:  Procedure Laterality Date   ADENOIDECTOMY     COLONOSCOPY WITH PROPOFOL N/A 11/06/2019   Procedure: COLONOSCOPY WITH PROPOFOL;  Surgeon: Virgel Manifold, MD;  Location: [facility] ENDOSCOPY;  Service: Endoscopy;  Laterality: N/A;  2 day prep    COLONOSCOPY WITH PROPOFOL N/A 06/29/2022   Procedure: COLONOSCOPY WITH PROPOFOL;  Surgeon: Lin Landsman, MD;  Location: [facility] ENDOSCOPY;  Service: Gastroenterology;  Laterality: N/A;   CORONARY ARTERY BYPASS GRAFT     June 27 2018   ESOPHAGOGASTRODUODENOSCOPY (EGD) WITH PROPOFOL N/A 05/25/2018   Procedure: ESOPHAGOGASTRODUODENOSCOPY (EGD) WITH PROPOFOL;  Surgeon: Lucilla Lame, MD;  Location: [facility] ENDOSCOPY;  Service: Endoscopy;  Laterality: N/A;   ESOPHAGOGASTRODUODENOSCOPY (EGD) WITH PROPOFOL N/A 11/06/2019   Procedure: ESOPHAGOGASTRODUODENOSCOPY (EGD) WITH PROPOFOL;  Surgeon: Virgel Manifold, MD;  Location: [facility] ENDOSCOPY;  Service: Endoscopy;  Laterality: N/A;   ESOPHAGOGASTRODUODENOSCOPY (EGD) WITH PROPOFOL N/A 06/29/2022   Procedure: ESOPHAGOGASTRODUODENOSCOPY (EGD) WITH PROPOFOL;  Surgeon: Lin Landsman, MD;  Location: [facility] ENDOSCOPY;  Service: Gastroenterology;  Laterality: N/A;   GIVENS CAPSULE STUDY N/A 07/22/2022   Procedure: GIVENS CAPSULE STUDY;  Surgeon: Lin Landsman, MD;  Location: [facility] ENDOSCOPY;  Service: Gastroenterology;  Laterality: N/A;   MITRAL VALVE REPLACEMENT     MULTIPLE TOOTH EXTRACTIONS     TONSILLECTOMY     TOTAL KNEE ARTHROPLASTY Right 09/04/2016   Procedure: TOTAL KNEE ARTHROPLASTY; with lateral release;  Surgeon: Earlie Server, MD;  Location: [place];  Service: Orthopedics;  Laterality: Right;     Social History   reports that she has been smoking cigarettes. She has been smoking an average of .1 packs per day. She has never used smokeless tobacco. She reports that she does not currently use drugs. She reports that she does not drink alcohol.  Family History   Her family history includes Asthma in her mother; Breast cancer in her maternal aunt; COPD in her mother; Cancer in her mother; Congestive Heart Failure in her father; Rheum arthritis in her mother.   Allergies Allergies  Allergen Reactions   Amiodarone Nausea And Vomiting   Aspirin Swelling   Flexeril [Cyclobenzaprine] Swelling   Trazamine [Trazodone & Diet Manage Prod] Nausea And Vomiting   Codeine Rash   Tramadol Rash     Home Medications  Prior to Admission medications   Medication Sig Start Date End Date Taking? Authorizing Provider  albuterol (VENTOLIN HFA) 108 (90 Base) MCG/ACT inhaler Inhale 1-2 puffs into the lungs every 6 (six) hours as needed for wheezing or shortness of breath.    [provider]  allopurinol (ZYLOPRIM) 100 MG tablet Take 1 tablet (100 mg total) by mouth 2 (two) times daily. 03/05/20   Milinda Pointer, MD  Cholecalciferol (VITAMIN D) 50 MCG (2000 UT) CAPS Take 1 capsule (2,000 Units total) by mouth daily. 06/19/21   Alisa Graff, FNP  colchicine 0.6 MG tablet Take 1 tablet (0.6 mg total) by mouth daily. As directed 03/05/20   Milinda Pointer, MD  diazepam (VALIUM) 10 MG tablet Take 10 mg by mouth 2 (two) times daily as needed for anxiety. 07/16/22   [provider]  docusate sodium (COLACE) 100 MG capsule Take 100 mg by mouth 2 (two) times daily as needed for mild constipation or moderate constipation.    [provider]  empagliflozin (JARDIANCE) 10 MG TABS tablet Take 10 mg by mouth daily.    [provider]  furosemide (LASIX) 20 MG tablet Take 3 tablets (60 mg total) by mouth  daily. 10/02/22 11/01/22  Wyvonnia Dusky, MD  gabapentin (NEURONTIN) 600 MG tablet Take 0.5 tablets (300 mg total) by mouth 2 (two) times daily. Patient taking differently: Take 600 mg by mouth 3 (three) times daily. 12/06/18   Vaughan Basta, MD  ipratropium-albuterol (DUONEB) 0.5-2.5 (3) MG/3ML SOLN INHALE THE CONTENTS OF 1 VIAL(3MLS) VIA NEBULIZER 3 TIMES DAILY AS NEEDED 10/24/19   Carles Collet M, PA-C  Iron-FA-B Cmp-C-Biot-Probiotic (FUSION PLUS) CAPS Take 1 capsule by mouth daily. Patient not taking: Reported on 09/16/2022 06/25/22   Lin Landsman, MD  lactulose (CHRONULAC) 10 GM/15ML solution Take 45 mLs (30 g total) by mouth 2 (two) times daily. 10/01/22 10/31/22  Wyvonnia Dusky, MD  metolazone (ZAROXOLYN) 2.5 MG tablet Take 1 tablet (2.5 mg total) by mouth every Monday, Wednesday, and Friday. 10/02/22 11/01/22  Wyvonnia Dusky, MD  metoprolol tartrate (LOPRESSOR) 50 MG tablet Take 1 tablet (50 mg total) by mouth 2 (two) times daily. 10/01/22 10/31/22  Wyvonnia Dusky, MD  nitroGLYCERIN (NITROSTAT) 0.4 MG SL tablet Place 1 tablet (0.4 mg total) under the tongue every 5 (five) minutes as needed for chest pain. 06/19/21   Alisa Graff, FNP  omeprazole (PRILOSEC) 40 MG capsule Take 40 mg by mouth daily.    [provider]  oxyCODONE ER 9 MG C12A Take 9 mg by mouth 2 (two) times daily.    [provider]  potassium chloride (KLOR-CON) 10 MEQ tablet Take 4 tablets (40 mEq total) by mouth 2 (two) times daily. And additional 44mq on metolazone days Patient taking differently: Take 40 mEq by mouth See admin instructions. Take 4 tablets (472m) by mouth twice daily - take an additional 2 tablets (2063m by mouth when taking metolazone 01/05/22   HacAlisa GraffNP  promethazine (PHENERGAN) 12.5 MG tablet Take 1 tablet (12.5 mg total) by mouth every 8 (eight) hours as needed for nausea or vomiting. Patient taking differently: Take 12.5 mg by mouth 2 (two)  times daily as needed for nausea or vomiting. 02/20/20   Bacigalupo, Dionne Bucy, MD  rifaximin (XIFAXAN) 550 MG TABS tablet Take 1 tablet (550 mg total) by mouth 2 (two) times daily. 10/01/22 10/31/22  Wyvonnia Dusky, MD  rivaroxaban (XARELTO) 20 MG TABS tablet Take 20 mg by mouth daily with supper.    [provider]  rosuvastatin (CRESTOR) 10 MG tablet Take 10 mg by mouth daily.    [provider]  spironolactone (ALDACTONE) 25 MG tablet Take 0.5 tablets (12.5 mg total) by mouth daily. 10/02/22 11/01/22  Wyvonnia Dusky, MD  Scheduled Meds:  Chlorhexidine Gluconate Cloth  6 each Topical Daily   feeding supplement  237 mL Oral TID BM   lactulose  10 g Oral BID   multivitamin with minerals  1 tablet Oral Daily   pantoprazole  80 mg Oral Daily   rifaximin  550 mg Oral BID   rosuvastatin  10 mg Oral Daily   Continuous Infusions:  sodium chloride     sodium chloride     amiodarone 30 mg/hr (10/09/22 0700)   furosemide (LASIX) 200 mg in dextrose 5 % 100 mL (2 mg/mL) infusion 4 mg/hr (10/09/22 0700)   heparin 1,650 Units/hr (10/09/22 0700)   morphine     morphine     norepinephrine (LEVOPHED) Adult infusion 9 mcg/min (10/09/22 0700)   vasopressin 0.03 Units/min (10/09/22 0700)   PRN Meds:.acetaminophen **OR** acetaminophen, acetaminophen **OR** acetaminophen, diazepam, docusate sodium, fentaNYL (SUBLIMAZE) injection, glycopyrrolate **OR** glycopyrrolate **OR** glycopyrrolate, melatonin, metoprolol tartrate, morphine, nitroGLYCERIN, oxyCODONE, polyvinyl alcohol, senna-docusate  Active Hospital Problem list     Assessment & Plan:  Acute on Chronic  Hypoxic Respiratory Failure secondary to Acute on chronic Decompensated HFrEF, AECOPD and ?underlying Pneumonia PMHx: COPD w/ chronic respiratory failure on baseline 3 L, ongoing tobacco use , OSA on CPAP -Supplemental O2 as needed to maintain O2 saturations 88 to 92% -Consider NIPPV/BiPAP, wean as tolerated -High risk for  intubation -Follow intermittent ABG and chest x-ray as needed -As needed bronchodilators -Encourage smoking cessation  Sepsis with septic shock -urinary source - PRESENT ON ADMISSION Lactic: 2.1, Baseline PCT: 0.69, UA:? UTI , CXR: ? Pneumonia Initial interventions/workup included: 3 L of NS/LR & Cefepime/ Vancomycin/ meets SIRS criteria: Shock Index (SI)2 -Supplemental oxygen as needed, to maintain SpO2 > 90% -F/u cultures, trend lactic/ PCT -Monitor WBC/ fever curve -IV antibiotics: cefepime & vancomycin -Gentle IVF hydration as needed -Pressors for MAP goal >65 -Strict I/O's   Acute on Chronic Decompensated HFrEF (current EF 20-25%) PAF PMHx: CAD, HLD, HTNsevere pulmonary hypertension, RV dysfunction,Rheumatic mitral valve stenosis s/p MVR 2019  -BNP on admission was elevated at 558.5 with small probable interstitial edema and small left pleural effusion on cxr. -Continuous cardiac monitoring -Diuresis as BP and renal function permits ~ currently holding due to AKI and hypotension -Hold metoprolol and Spironolactone for now -Hold Xarelto -Continue midodrine -Cardiology consult if appropriate   AKI on CKD Stage III Hyperkalemia -Monitor I&O's / urinary output -Follow BMP -Ensure adequate renal perfusion -Avoid nephrotoxic agents as able -Replace electrolytes as indicated   Acute Metabolic Encephalopathy ~ Hx: TIA/CVA, polypharmacy on multiple sedating meds -Provide supportive care -Avoid sedating meds as able -CT Head  negative for acute intracranial abnormality   Diabetes mellitus HgbA1c  -  CBG's Q4; Target range of 140 to 180 -SSI -Follow ICU Hypo/Hyperglycemia protocol   Best practice:  Diet:  NPO Pain/Anxiety/Delirium protocol (if indicated): No VAP protocol (if indicated): Not indicated DVT prophylaxis: Subcutaneous Heparin GI prophylaxis: PPI Glucose control:  SSI Yes Central venous access:  N/A Arterial line:  N/A Foley:  N/A Mobility:  bed rest   PT consulted: N/A Last date of multidisciplinary goals of care discussion [11/6] Code Status:  DNR Disposition: Stepdown   = Goals of Care = Code Status Order: DNR  Primary Emergency Contact: Murdock,[name] Patient wishes to pursue ongoing treatment, but concurred that if deteriorated to pulselessness, patient would prefer a natural death as opposed to invasive measures such as CPR and intubation.  Family should be immediately contacted in such situations.  Critical care provider statement:   Total critical care time: 33 minutes   Performed by: Lanney Gins MD   Critical care time was exclusive of separately billable procedures and treating other patients.   Critical care was necessary to treat or prevent imminent or life-threatening deterioration.   Critical care was time spent personally by me on the following activities: development of treatment plan with patient and/or surrogate as well as nursing, discussions with consultants, evaluation of patient's response to treatment, examination of patient, obtaining history from patient or surrogate, ordering and performing treatments and interventions, ordering and review of laboratory studies, ordering and review of radiographic studies, pulse oximetry and re-evaluation of patient's condition.    Ottie Glazier, M.D.  Pulmonary & Critical Care Medicine

## 2022-10-21 ENCOUNTER — Ambulatory Visit: Payer: Medicaid Other | Admitting: Family

## 2022-10-30 NOTE — Progress Notes (Signed)
Chaplain was paged by nurse and notified of pt passing. Pt was surrounded by family and loved ones. Chaplain offered compassionate presence and listening ear. Offered to follow up later on if needed. Family doing okay for now.

## 2022-10-30 NOTE — Progress Notes (Signed)
Called into the pt's room by her mother who verbalized "she's gone"; pt DNR/NMP, pt pronounced by A. Sharen Counter RN and this Probation officer; pt's family at bedside; emotional support given to the family; verbalized to the pt's mother that we will need to get funeral home information when she is ready; advised them that I would have the [place] paged

## 2022-10-30 NOTE — Death Summary Note (Signed)
DEATH SUMMARY   Patient Details  Name: Chelsea Ramsey MRN: [id number] DOB: September 14, 1966  Admission/Discharge Information   Admit Date:  10/15/2022  Date of Death: Date of Death: 10-21-22  Time of Death: Time of Death: February 21, 1224  Length of Stay: Feb 14, 2023  Referring Physician: Emelia Loron, NP   Reason(s) for Hospitalization  Acute decompensated systolic CHF with EF <19% Acute on chronic renal failure Polysubstance abuse History of CVA Atrial fibrillation  Diagnoses  Preliminary cause of death: CHF (congestive heart failure) ([place]) Secondary Diagnoses (including complications and co-morbidities):  Principal Problem:   Severe sepsis with acute organ dysfunction ([place]) Active Problems:   A-fib ([place])   HTN (hypertension)   Chronic anticoagulation ([place])   GAD (generalized anxiety disorder)   History of substance abuse ([place])   Insomnia   Acute renal failure ([place])   Altered mental status   Hypotension   Hepatic steatosis   Brief Hospital Course (including significant findings, care, treatment, and services provided and events leading to death)   56 y.o female with significant PMH of drug-induced gout of left foot (03/01/2018), Anxiety, Arthritis, Asthma,COPD, Coronary artery disease, HFrEF (current EF 35-40%) DM, Fibromyalgia, GERD, Hypertension, Pulmonary HTN,  Rheumatic fever/heart disease, paroxysmal atrial fibrillation on Xarelto, COPD w/ chronic respiratory failure on baseline 3 L, ongoing tobacco use, TIA/CVA, and Sleep apnea. who presented to the ED with chief complaints of generalized weakness shortness of breath and itching.   On review of her chart, patient was recently discharged on 10/17 following admission for acute on chronic CHF exacerbation, transaminitis, AKI and altered mental status.  Per ED reports, since discharge patient has been progressively weak and not able to walk or take her medication as prescribed. Per EMS report, patient fell from her wheelchair but  denied hitting her head.    She was seen by GI, cardiology, nephrology, critical care team with failure of maximal medical management.   Her family was involved in her medical care, mother of patient is POA and after full discussion of care plan wished to proceed with comfort care measures.   Pertinent Labs and Studies  Significant Diagnostic Studies NM Hepatobiliary Liver Func  Result Date: 10/06/2022 CLINICAL DATA:  Hyperbilirubinemia.  Concern for cholecystitis. EXAM: NUCLEAR MEDICINE HEPATOBILIARY IMAGING TECHNIQUE: Sequential images of the abdomen were obtained out to 60 minutes following intravenous administration of radiopharmaceutical. RADIOPHARMACEUTICALS:  7.3 mCi Tc-63m Choletec IV COMPARISON:  Ultrasound 10/05/2022 FINDINGS: There is delayed clearance radiotracer from the blood pool. Delayed clearance of radiotracer from the liver into the bile ducts. The gallbladder begins to fill at 60 minutes. The gallbladder is elongated nondistended. No counts evident within common bile duct or small bowel over the course the study. IMPRESSION: 1. Filling of the gallbladder consistent with patent cystic duct and NOT consistent with acute cholecystitis. 2. Poor clearance radiotracer from blood pool and poor clearance of radiotracer from the liver. Favor liver dysfunction over biliary obstruction. The common bile duct is normal on ultrasound 1 day prior. The gallbladder is nondistended. Electronically Signed   By: SSuzy BouchardM.D.   On: 10/06/2022 16:52   DG Chest Port 1 View  Result Date: 10/05/2022 CLINICAL DATA:  Evaluate central venous catheter placement EXAM: PORTABLE CHEST 1 VIEW COMPARISON:  1Nov 16, 2023FINDINGS: Signs of previous median sternotomy. 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 There is a right IJ catheter with tip in the projection of the SVC. No pneumothorax identified. Cardiac enlargement is unchanged. Unchanged appearance of diffuse reticular interstitial  opacities throughout both lungs. No sign of pleural effusion. IMPRESSION: Right IJ catheter with tip in the projection of the SVC. No pneumothorax. No change in diffuse increase interstitial opacities which may reflect interstitial edema, atypical infection or chronic lung disease. Electronically Signed   By: Kerby Moors M.D.   On: 10/05/2022 14:14   US Abdomen Limited RUQ (LIVER/GB)  Result Date: 10/05/2022 CLINICAL DATA:  Hyperbilirubinemia EXAM: ULTRASOUND ABDOMEN LIMITED RIGHT UPPER QUADRANT COMPARISON:  MRI and ultrasound October of 2023. FINDINGS: Gallbladder: No gallstones. No Murphy sign. Small amount of gallbladder sludge. Mild gallbladder wall thickening, 4.3 mm. The differential diagnosis is hydrops due to poor right heart function versus acalculous cholecystitis peer Common bile duct: Diameter: 4.2 mm.  No visible stone. Liver: No focal lesion. Increased echogenicity suggesting steatosis. Trace amount of ascites. Portal vein is patent on color Doppler imaging with to and fro flow directionality. Other: None. IMPRESSION: 1. Small amount of gallbladder sludge. No gallstones. Mild gallbladder wall thickening. The differential diagnosis is hydrops due to poor right heart function versus acalculous cholecystitis. 2. Hepatic steatosis. Trace amount of ascites. 3. To and fro flow directionality in the portal vein. This constellation of findings is consistent with chronic hepatic parenchymal disease. Electronically Signed   By: Nelson Chimes M.D.   On: 10/05/2022 09:24   CT HEAD WO CONTRAST (5MM)  Result Date: 10/23/2022 CLINICAL DATA:  Mental status change EXAM: CT HEAD WITHOUT CONTRAST TECHNIQUE: Contiguous axial images were obtained from the base of the skull through the vertex without intravenous contrast. RADIATION DOSE REDUCTION: This exam was performed according to the departmental dose-optimization program which includes automated exposure control, adjustment of the mA and/or kV according to  patient size and/or use of iterative reconstruction technique. COMPARISON:  MRI brain February 01, 2022 and head CT January 31, 2020 FINDINGS: Brain: Tiny remote left cerebellar infarct. No evidence of acute infarction, hemorrhage, hydrocephalus, extra-axial collection or mass lesion/mass effect. Vascular: No hyperdense vessel or unexpected calcification. Skull: Similar appearance of the left inferior orbital wall fracture with herniation of fat through the fracture defect. Negative for acute fracture or focal lesion. Sinuses/Orbits: Visualized portions of the paranasal sinuses predominantly clear. Other: None IMPRESSION: 1. No acute intracranial abnormality. 2. Tiny remote left cerebellar infarct. 3. Similar appearance of the left inferior orbital wall fracture with herniation of fat through the fracture defect. Electronically Signed   By: Dahlia Bailiff M.D.   On: 10/25/2022 19:14   DG Chest Port 1 View  Result Date: 10/02/2022 CLINICAL DATA:  Shortness of breath. EXAM: PORTABLE CHEST 1 VIEW COMPARISON:  09/25/2022 FINDINGS: Stable enlarged cardiac silhouette, median sternotomy wires, prosthetic mitral valve and left atrial clip. Stable prominence of the interstitial markings with no Kerley lines. Possible small left pleural effusion. Thoracic spine degenerative changes. IMPRESSION: 1. Stable cardiomegaly and chronic interstitial lung disease with possible superimposed interstitial edema. 2. Possible small left pleural effusion. Electronically Signed   By: Claudie Revering M.D.   On: 10/23/2022 16:06   DG Chest Port 1 View  Result Date: 09/25/2022 CLINICAL DATA:  Shortness of breath, renal failure. EXAM: PORTABLE CHEST 1 VIEW COMPARISON:  Chest radiograph dated 09/16/2022. FINDINGS: The heart is enlarged. Mild-to-moderate bilateral interstitial and airspace opacities are redemonstrated. There is no pleural effusion or pneumothorax. Degenerative changes are seen in the spine. A mitral valve prosthesis and atrial  appendage clip, and median sternotomy wires are redemonstrated. IMPRESSION: Cardiomegaly with mild-to-moderate bilateral interstitial and airspace opacities, likely pulmonary edema. Electronically Signed  By: Zerita Boers M.D.   On: 09/25/2022 10:23   ECHOCARDIOGRAM LIMITED  Result Date: 09/23/2022    ECHOCARDIOGRAM LIMITED REPORT   Patient Name:   ANGELEEN HORNEY Date of Exam: 09/23/2022 Medical Rec #:  [id number]                 Height:       64.0 in Accession #:    [id number]                Weight:       243.4 lb Date of Birth:  [birth year]                 BSA:          2.127 m Patient Age:    50 years                  BP:           109/71 mmHg Patient Gender: F                         HR:           96 bpm. Exam Location:  [facility] Procedure: Limited Echo Indications:     CHF I50.9  History:         Patient has prior history of Echocardiogram examinations, most                  recent 09/17/2022. CHF, Prior Cardiac Surgery, COPD; Risk                  Factors:Hypertension.                   Mitral Valve: bioprosthetic valve valve is present in the                  mitral position.  Sonographer:     Sherrie Sport Referring Phys:  [id number] [place] TANG Diagnosing Phys: Serafina Royals MD  Sonographer Comments: Technically difficult study due to poor echo windows, suboptimal apical window and suboptimal parasternal window. IMPRESSIONS  1. Left ventricular ejection fraction, by estimation, is 35 to 40%. The left ventricle has moderately decreased function. The left ventricle demonstrates global hypokinesis. The left ventricular internal cavity size was mildly dilated.  2. There is LV septal flattenting in systole and diastole and dilated hepatic vein consistant with severe phtn. Right ventricular systolic function is moderately reduced. The right ventricular size is moderately enlarged. Mildly increased right ventricular wall thickness. There is severely elevated pulmonary artery systolic pressure.  3.  Left atrial size was severely dilated.  4. Right atrial size was severely dilated.  5. Cannot rule out mild stenosis. doppler difficult to interpret. The mitral valve is degenerative. Mild mitral valve regurgitation. 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  6. The tricuspid valve is abnormal. Tricuspid valve regurgitation is severe.  7. The aortic valve is calcified. Aortic valve regurgitation is moderate. FINDINGS  Left Ventricle: Left ventricular ejection fraction, by estimation, is 35 to 40%. The left ventricle has moderately decreased function. The left ventricle demonstrates global hypokinesis. The left ventricular internal cavity size was mildly dilated. Right Ventricle: There is LV septal flattenting in systole and diastole and dilated hepatic vein consistant with severe phtn. The right ventricular size is moderately enlarged. Mildly increased right ventricular wall thickness. Right ventricular systolic  function is moderately reduced. There is severely elevated pulmonary  artery systolic pressure. Left Atrium: Left atrial size was severely dilated. Right Atrium: Right atrial size was severely dilated. Pericardium: There is no evidence of pericardial effusion. Mitral Valve: Cannot rule out mild stenosis. doppler difficult to interpret. The mitral valve is degenerative in appearance. Mild mitral valve regurgitation. 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 MV peak gradient, 19.2 mmHg. The mean mitral valve gradient is 11.0 mmHg. Tricuspid Valve: The tricuspid valve is abnormal. Tricuspid valve regurgitation is severe. Aortic Valve: The aortic valve is calcified. Aortic valve regurgitation is moderate. Pulmonic Valve: The pulmonic valve was normal in structure. Pulmonic valve regurgitation is mild. Aorta: The aortic root and ascending aorta are structurally normal, with no evidence of dilitation. IAS/Shunts: No atrial level shunt detected by color flow Doppler. LEFT  VENTRICLE PLAX 2D LVIDd:         4.70 cm LVIDs:         3.10 cm LV PW:         0.90 cm LV IVS:        0.90 cm  RIGHT VENTRICLE RV Basal diam:  5.00 cm RV Mid diam:    6.50 cm LEFT ATRIUM         Index LA diam:    6.50 cm 3.06 cm/m   AORTA Ao Root diam: 2.90 cm MITRAL VALVE                TRICUSPID VALVE MV Area (PHT): 2.63 cm     TR Peak grad:   53.0 mmHg MV Peak grad:  19.2 mmHg    TR Vmax:        364.00 cm/s MV Mean grad:  11.0 mmHg MV Vmax:       2.19 m/s MV Vmean:      161.0 cm/s MV Decel Time: 288 msec MV E velocity: 239.00 cm/s Serafina Royals MD Electronically signed by Serafina Royals MD Signature Date/Time: 09/23/2022/4:02:55 PM    Final    ECHOCARDIOGRAM COMPLETE  Result Date: 09/17/2022    ECHOCARDIOGRAM REPORT   Patient Name:   SHARDAE KLEINMAN Date of Exam: 09/17/2022 Medical Rec #:  [id number]                 Height:       64.0 in Accession #:    [id number]                Weight:       265.0 lb Date of Birth:  1966-09-23                 BSA:          2.205 m Patient Age:    70 years                  BP:           93/79 mmHg Patient Gender: F                         HR:           110 bpm. Exam Location:  [facility] Procedure: 2D Echo, Color Doppler and Cardiac Doppler Indications:     I50.31 CHF-Acute Diastolic  History:         Patient has prior history of Echocardiogram examinations, most                  recent 11/05/2018. CHF, CAD, COPD; Risk Factors:Hypertension,  Diabetes and Sleep Apnea.  Sonographer:     Charmayne Sheer Referring Phys:  [id number] [place] TANG Diagnosing Phys: Yolonda Kida MD  Sonographer Comments: Suboptimal apical window and no subcostal window. Image acquisition challenging due to patient body habitus and Image acquisition challenging due to COPD. IMPRESSIONS  1. Left ventricular ejection fraction, by estimation, is 20 to 25%. The left ventricle has severely decreased function. The left ventricle demonstrates global hypokinesis. Left ventricular  diastolic parameters are consistent with Grade III diastolic dysfunction (restrictive). There is the interventricular septum is flattened in systole and diastole, consistent with right ventricular pressure and volume overload.  2. Right ventricular systolic function is moderately reduced. The right ventricular size is moderately enlarged. Mildly increased right ventricular wall thickness.  3. Left atrial size was moderately dilated.  4. Right atrial size was severely dilated.  5. The mitral valve is abnormal. Mild to moderate mitral valve regurgitation.  6. Tricuspid valve regurgitation is severe.  7. The aortic valve is calcified. Aortic valve regurgitation is mild to moderate. Aortic valve sclerosis/calcification is present, without any evidence of aortic stenosis. Conclusion(s)/Recommendation(s): Poor windows for evaluation of left ventricular function by transthoracic echocardiography. Would recommend an alternative means of evaluation. FINDINGS  Left Ventricle: Left ventricular ejection fraction, by estimation, is 20 to 25%. The left ventricle has severely decreased function. The left ventricle demonstrates global hypokinesis. The left ventricular internal cavity size was normal in size. There is borderline concentric left ventricular hypertrophy. The interventricular septum is flattened in systole and diastole, consistent with right ventricular pressure and volume overload. Left ventricular diastolic parameters are consistent with Grade III diastolic dysfunction (restrictive). Right Ventricle: The right ventricular size is moderately enlarged. Mildly increased right ventricular wall thickness. Right ventricular systolic function is moderately reduced. Left Atrium: Left atrial size was moderately dilated. Right Atrium: Right atrial size was severely dilated. Pericardium: There is no evidence of pericardial effusion. Mitral Valve: The mitral valve is abnormal. Mild to moderate mitral valve regurgitation.  Tricuspid Valve: The tricuspid valve is grossly normal. Tricuspid valve regurgitation is severe. Aortic Valve: The aortic valve is calcified. Aortic valve regurgitation is mild to moderate. Aortic regurgitation PHT measures 233 msec. Aortic valve sclerosis/calcification is present, without any evidence of aortic stenosis. Aortic valve mean gradient measures 6.0 mmHg. Aortic valve peak gradient measures 11.3 mmHg. Aortic valve area, by VTI measures 1.14 cm. Pulmonic Valve: The pulmonic valve was normal in structure. Pulmonic valve regurgitation is mild to moderate. Aorta: The ascending aorta was not well visualized. IAS/Shunts: No atrial level shunt detected by color flow Doppler.  LEFT VENTRICLE PLAX 2D LVIDd:         3.50 cm LVIDs:         3.20 cm LV PW:         1.20 cm LV IVS:        0.90 cm LVOT diam:     1.80 cm LV SV:         25 LV SV Index:   12 LVOT Area:     2.54 cm  RIGHT VENTRICLE RV Basal diam:  5.30 cm RV Mid diam:    5.00 cm RV S prime:     7.62 cm/s LEFT ATRIUM         Index       RIGHT ATRIUM           Index LA diam:    5.50 cm 2.49 cm/m  RA Area:     28.50 cm  RA Volume:   104.00 ml 47.17 ml/m  AORTIC VALVE                     PULMONIC VALVE AV Area (Vmax):    0.87 cm      PV Vmax:          0.98 m/s AV Area (Vmean):   0.90 cm      PV Vmean:         69.200 cm/s AV Area (VTI):     1.14 cm      PV VTI:           0.128 m AV Vmax:           168.00 cm/s   PV Peak grad:     3.9 mmHg AV Vmean:          116.000 cm/s  PV Mean grad:     2.5 mmHg AV VTI:            0.223 m       PR End Diast Vel: 10.24 msec AV Peak Grad:      11.3 mmHg AV Mean Grad:      6.0 mmHg LVOT Vmax:         57.60 cm/s LVOT Vmean:        40.900 cm/s LVOT VTI:          0.100 m LVOT/AV VTI ratio: 0.45 AI PHT:            233 msec  AORTA Ao Root diam: 3.10 cm MITRAL VALVE               TRICUSPID VALVE MV Area (PHT): 5.95 cm    TR Peak grad:   55.7 mmHg MV Decel Time: 128 msec    TR Vmax:        373.00  cm/s MV E velocity: 87.60 cm/s                            SHUNTS                            Systemic VTI:  0.10 m                            Systemic Diam: 1.80 cm Yolonda Kida MD Electronically signed by Yolonda Kida MD Signature Date/Time: 09/17/2022/4:54:34 PM    Final    DG Chest 1 View  Result Date: 09/16/2022 CLINICAL DATA:  Worsening shortness of breath. 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 Cardiomegaly which has increased from 2021. Slight globular configuration of the heart, query pericardial effusion. Suspect small right pleural effusion. Chronic bilateral lung opacities that appears stable. No acute airspace disease or pneumothorax. On limited assessment, no acute osseous findings. IMPRESSION: 1. Increased cardiomegaly from 2021. Slight globular configuration of the heart, query pericardial effusion. 2. Suspect small right pleural effusion. 3. Stable chronic bilateral lung opacities. Electronically Signed   By: Keith Rake M.D.   On: 09/16/2022 17:21   MR ABDOMEN MRCP WO CONTRAST  Result Date: 09/16/2022 CLINICAL DATA:  56 year old female with history of jaundice. Nausea. EXAM: MRI ABDOMEN WITHOUT CONTRAST  (INCLUDING MRCP) TECHNIQUE: Multiplanar multisequence MR imaging of the abdomen was performed. Heavily  T2-weighted images of the biliary and pancreatic ducts were obtained, and three-dimensional MRCP images were rendered by post processing. COMPARISON:  No prior abdominal MRI or MRCP. Abdominal ultrasound 09/15/2022. FINDINGS: Comment: Portions of today's examination are substantially limited by patient respiratory motion. The study is also severely limited for detection and characterization of visceral and/or vascular lesions by lack of IV gadolinium. Lower chest: Severe cardiomegaly. Massively distended inferior vena cava. Hepatobiliary: No  definite suspicious cystic or solid hepatic lesions are confidently identified on today's motion limited noncontrast examination. No intra or extrahepatic biliary ductal dilatation noted on MRCP images. Common bile duct measures only 3 mm in the porta hepatis. No filling defect within the common bile duct to suggest choledocholithiasis. T1 hyperintense, T2 hypointense material lies dependently within the gallbladder. Gallbladder does not appear overly distended. Accurate assessment for gallbladder wall thickening is limited on today's examination secondary to motion, but there may be some very mild gallbladder wall thickening and edema. No overt pericholecystic fluid or surrounding inflammatory changes. Pancreas: No definite pancreatic mass or peripancreatic fluid collections or inflammatory changes noted on today's noncontrast examination. No pancreatic ductal dilatation. Spleen:  Unremarkable. Adrenals/Urinary Tract: Unenhanced appearance of the kidneys and bilateral adrenal glands is normal. No hydroureteronephrosis in the visualized portions of the abdomen. Stomach/Bowel: Visualized portions are unremarkable. Vascular/Lymphatic: No aneurysm identified in the visualized abdominal vasculature. No lymphadenopathy noted in the abdomen. Other: No significant volume of ascites noted in the visualized portions of the peritoneal cavity. Musculoskeletal: No aggressive appearing osseous lesions are noted in the visualized portions of the skeleton. IMPRESSION: 1. Biliary sludge lying dependently in the gallbladder. Gallbladder wall may be mildly edematous, but is poorly evaluated on today's motion limited examination. If there is any clinical concern for acute cholecystitis, further evaluation with right upper quadrant abdominal ultrasound or nuclear medicine hepatobiliary scan should be considered at this time. 2. No biliary tract dilatation to suggest obstruction. No evidence of choledocholithiasis. 3. Severe  cardiomegaly. Electronically Signed   By: Vinnie Langton M.D.   On: 09/16/2022 05:28   US ABDOMEN LIMITED RUQ (LIVER/GB)  Result Date: 09/15/2022 CLINICAL DATA:  Elevated bilirubin EXAM: ULTRASOUND ABDOMEN LIMITED RIGHT UPPER QUADRANT COMPARISON:  CT abdomen and pelvis 12/01/2018 FINDINGS: Gallbladder: Sludge is present within the gallbladder. Small amount of pericholecystic fluid. The gallbladder wall is mildly thickened measuring 5 mm. Sonographic Murphy's sign was positive. Common bile duct: Diameter: 3 mm Liver: No focal lesion identified. Increased echogenicity. Portal vein is patent on Doppler imaging with bidirectional flow. Other: None. IMPRESSION: 1. Findings compatible with acute cholecystitis. 2. 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 Electronically Signed   By: Placido Sou M.D.   On: 09/15/2022 23:07    Microbiology Recent Results (from the past 240 hour(s))  Resp Panel by RT-PCR (Flu A&B, Covid) Anterior Nasal Swab     Status: None   Collection Time: 10/21/2022  3:44 PM   Specimen: Anterior Nasal Swab  Result Value Ref Range Status   SARS Coronavirus 2 by RT PCR NEGATIVE NEGATIVE Final    Comment: (NOTE) SARS-CoV-2 target nucleic acids are NOT DETECTED.  The SARS-CoV-2 RNA is generally detectable in upper respiratory specimens during the acute phase of infection. The lowest concentration of SARS-CoV-2 viral copies this assay can detect is 138 copies/mL. A negative result does not preclude SARS-Cov-2 infection and should not be used as the sole basis for treatment or other patient management decisions. A negative result may occur  with  improper specimen collection/handling, submission of specimen other than nasopharyngeal swab, presence of viral mutation(s) within the areas targeted by this assay, and inadequate number of viral copies(<138 copies/mL). A negative result must be combined with clinical observations,  patient history, and epidemiological information. The expected result is Negative.  Fact Sheet for Patients:  EntrepreneurPulse.com.au  Fact Sheet for Healthcare Providers:  IncredibleEmployment.be  This test is no t yet approved or cleared by the [place] FDA and  has been authorized for detection and/or diagnosis of SARS-CoV-2 by FDA under an Emergency Use Authorization (EUA). This EUA will remain  in effect (meaning this test can be used) for the duration of the COVID-19 declaration under Section 564(b)(1) of the Act, 21 U.S.C.section 360bbb-3(b)(1), unless the authorization is terminated  or revoked sooner.       Influenza A by PCR NEGATIVE NEGATIVE Final   Influenza B by PCR NEGATIVE NEGATIVE Final    Comment: (NOTE) The Xpert Xpress SARS-CoV-2/FLU/RSV plus assay is intended as an aid in the diagnosis of influenza from Nasopharyngeal swab specimens and should not be used as a sole basis for treatment. Nasal washings and aspirates are unacceptable for Xpert Xpress SARS-CoV-2/FLU/RSV testing.  Fact Sheet for Patients: EntrepreneurPulse.com.au  Fact Sheet for Healthcare Providers: IncredibleEmployment.be  This test is not yet approved or cleared by the [place] FDA and has been authorized for detection and/or diagnosis of SARS-CoV-2 by FDA under an Emergency Use Authorization (EUA). This EUA will remain in effect (meaning this test can be used) for the duration of the COVID-19 declaration under Section 564(b)(1) of the Act, 21 U.S.C. section 360bbb-3(b)(1), unless the authorization is terminated or revoked.  Performed at [facility], [address] [place], [place] [address]   Urine Culture     Status: Abnormal   Collection Time: 10/12/2022  4:41 PM   Specimen: Urine, Clean Catch  Result Value Ref Range Status   Specimen Description   Final    URINE, CLEAN CATCH Performed at  [facility], [address], [place], [place] [address]    Special Requests   Final    NONE Performed at [facility], [place], [place] [address]    Culture (A)  Final    >=100,000 COLONIES/mL ESCHERICHIA COLI >=100,000 COLONIES/mL ENTEROCOCCUS FAECALIS    Report Status 10/07/2022 FINAL  Final   Organism ID, Bacteria ESCHERICHIA COLI (A)  Final   Organism ID, Bacteria ENTEROCOCCUS FAECALIS (A)  Final      Susceptibility   Escherichia coli - MIC*    AMPICILLIN <=2 SENSITIVE Sensitive     CEFAZOLIN <=4 SENSITIVE Sensitive     CEFEPIME <=0.12 SENSITIVE Sensitive     CEFTRIAXONE <=0.25 SENSITIVE Sensitive     CIPROFLOXACIN <=0.25 SENSITIVE Sensitive     GENTAMICIN <=1 SENSITIVE Sensitive     IMIPENEM <=0.25 SENSITIVE Sensitive     NITROFURANTOIN <=16 SENSITIVE Sensitive     TRIMETH/SULFA <=20 SENSITIVE Sensitive     AMPICILLIN/SULBACTAM <=2 SENSITIVE Sensitive     PIP/TAZO <=4 SENSITIVE Sensitive     * >=100,000 COLONIES/mL ESCHERICHIA COLI   Enterococcus faecalis - MIC*    AMPICILLIN <=2 SENSITIVE Sensitive     NITROFURANTOIN <=16 SENSITIVE Sensitive     VANCOMYCIN 1 SENSITIVE Sensitive     * >=100,000 COLONIES/mL ENTEROCOCCUS FAECALIS  Culture, blood (routine x 2)     Status: None   Collection Time: 10/23/2022  5:08 PM   Specimen: BLOOD LEFT HAND  Result Value Ref Range Status   Specimen Description BLOOD LEFT HAND  Final   Special Requests   Final    BOTTLES DRAWN AEROBIC ONLY Blood Culture results may not be optimal due to an inadequate volume of blood received in culture bottles   Culture   Final    NO GROWTH 5 DAYS Performed at [facility], [address], [place], [place] [address]    Report Status 10/09/2022 FINAL  Final  Culture, blood (routine x 2)     Status: None   Collection Time: 10/11/2022  5:42 PM   Specimen: BLOOD LEFT HAND  Result Value Ref Range Status   Specimen Description BLOOD LEFT HAND  Final   Special  Requests   Final    BOTTLES DRAWN AEROBIC AND ANAEROBIC Blood Culture adequate volume   Culture   Final    NO GROWTH 5 DAYS Performed at [facility], [address] [place], [place] [address]    Report Status 10/09/2022 FINAL  Final  MRSA Next Gen by PCR, Nasal     Status: None   Collection Time: 10/05/22  9:12 AM   Specimen: Nasal Mucosa; Nasal Swab  Result Value Ref Range Status   MRSA by PCR Next Gen NOT DETECTED NOT DETECTED Final    Comment: (NOTE) The GeneXpert MRSA Assay (FDA approved for NASAL specimens only), is one component of a comprehensive MRSA colonization surveillance program. It is not intended to diagnose MRSA infection nor to guide or monitor treatment for MRSA infections. Test performance is not FDA approved in patients less than 66 years old. Performed at [facility], [address] [place], [place] [address]     Lab Basic Metabolic Panel: Recent Labs  Lab 10/23/2022 1602 10/05/22 0306 10/06/22 0215 10/06/22 2050 10/07/22 0547 10/08/22 0450 10/09/22 0427  NA 137   < > 136 139 139 141 139  K 5.5*   < > 4.6 4.1 3.8 3.3* 3.8  CL 90*   < > 95* 97* 97* 99 99  CO2 34*   < > '30 29 30 30 29  '$ GLUCOSE 64*   < > 101* 105* 98 124* 124*  BUN 50*   < > 51* 53* 48* 48* 49*  CREATININE 3.83*   < > 3.59* 3.29* 3.03* 2.73* 2.48*  CALCIUM 9.3   < > 8.5* 8.8* 8.7* 9.0 8.8*  MG 2.2  --  1.9  --  1.7 2.3 2.2  PHOS  --   --  5.5*  --  4.6 3.8 2.9   < > = values in this interval not displayed.   Liver Function Tests: Recent Labs  Lab 10/05/22 0306 10/06/22 0215 10/07/22 0547 10/08/22 0450 10/09/22 0427  AST 73* 70* 60* 65* 75*  ALT 40 40 39 39 44  ALKPHOS 91 91 81 82 86  BILITOT 9.4* 9.4* 8.6* 8.5* 8.2*  PROT 7.1 7.0 6.8 6.7 6.9  ALBUMIN 2.8* 2.7* 2.6* 2.6* 2.6*   No results for input(s): "LIPASE", "AMYLASE" in the last 168 hours. Recent Labs  Lab 10/05/22 0306  AMMONIA 67*   CBC: Recent Labs  Lab 10/11/2022 1602 10/05/22 0306  10/06/22 0215 10/07/22 0547 10/08/22 0450 10/09/22 0427  WBC 12.0* 12.7* 13.5* 12.8* 12.1* 15.4*  NEUTROABS 9.4*  --   --   --   --   --   HGB 8.6* 9.0* 9.2* 8.8* 8.4* 8.6*  HCT 30.7* 32.5* 32.4* 30.3* 29.2* 29.4*  MCV 88.5 88.8 86.9 86.6  86.4 86.0  PLT 258 290 307 309 337 311   Cardiac Enzymes: No results for input(s): "CKTOTAL", "CKMB", "CKMBINDEX", "TROPONINI" in the last 168 hours. Sepsis Labs: Recent Labs  Lab 10/29/2022 1707 10/05/22 0306 10/05/22 1357 10/06/22 0215 10/07/22 0547 10/08/22 0450 10/09/22 0427  PROCALCITON 0.69 0.53  --  0.57 0.42  --   --   WBC  --  12.7*  --  13.5* 12.8* 12.1* 15.4*  LATICACIDVEN 2.1* 1.8 1.3  --   --   --   --     Procedures/Operations     Jahel Wavra 09-Nov-2022, 12:49 PM

## 2022-10-30 DEATH — deceased

## 2022-11-12 LAB — ACID FAST CULTURE WITH REFLEXED SENSITIVITIES (MYCOBACTERIA): Acid Fast Culture: NEGATIVE

## 2022-11-16 LAB — ACID FAST SMEAR (AFB, MYCOBACTERIA): Acid Fast Smear: NEGATIVE

## 2022-11-16 LAB — MISC LABCORP TEST (SEND OUT): Labcorp test code: 183764

## 2022-11-16 LAB — ACID FAST CULTURE WITH REFLEXED SENSITIVITIES (MYCOBACTERIA): Acid Fast Culture: NEGATIVE
